# Patient Record
Sex: Male | Born: 1960 | Race: White | Hispanic: No | Smoking: Former smoker
Health system: Southern US, Community
[De-identification: ages and names within clinical notes are randomized; demographics above are authoritative.]

## PROBLEM LIST (undated history)

## (undated) DIAGNOSIS — G8929 Other chronic pain: Secondary | ICD-10-CM

## (undated) DIAGNOSIS — K219 Gastro-esophageal reflux disease without esophagitis: Secondary | ICD-10-CM

## (undated) DIAGNOSIS — F32A Depression, unspecified: Secondary | ICD-10-CM

## (undated) DIAGNOSIS — J449 Chronic obstructive pulmonary disease, unspecified: Secondary | ICD-10-CM

## (undated) DIAGNOSIS — E871 Hypo-osmolality and hyponatremia: Secondary | ICD-10-CM

## (undated) DIAGNOSIS — C8290 Follicular lymphoma, unspecified, unspecified site: Secondary | ICD-10-CM

## (undated) DIAGNOSIS — M1990 Unspecified osteoarthritis, unspecified site: Secondary | ICD-10-CM

## (undated) DIAGNOSIS — Z8614 Personal history of Methicillin resistant Staphylococcus aureus infection: Secondary | ICD-10-CM

## (undated) DIAGNOSIS — B338 Other specified viral diseases: Secondary | ICD-10-CM

## (undated) DIAGNOSIS — U071 COVID-19: Secondary | ICD-10-CM

## (undated) DIAGNOSIS — E236 Other disorders of pituitary gland: Secondary | ICD-10-CM

## (undated) DIAGNOSIS — J3089 Other allergic rhinitis: Secondary | ICD-10-CM

## (undated) DIAGNOSIS — I4711 Inappropriate sinus tachycardia, so stated: Secondary | ICD-10-CM

## (undated) DIAGNOSIS — D332 Benign neoplasm of brain, unspecified: Secondary | ICD-10-CM

## (undated) DIAGNOSIS — R0600 Dyspnea, unspecified: Secondary | ICD-10-CM

## (undated) DIAGNOSIS — R748 Abnormal levels of other serum enzymes: Secondary | ICD-10-CM

## (undated) DIAGNOSIS — J189 Pneumonia, unspecified organism: Secondary | ICD-10-CM

## (undated) DIAGNOSIS — J45909 Unspecified asthma, uncomplicated: Secondary | ICD-10-CM

## (undated) DIAGNOSIS — I10 Essential (primary) hypertension: Secondary | ICD-10-CM

## (undated) DIAGNOSIS — F329 Major depressive disorder, single episode, unspecified: Secondary | ICD-10-CM

## (undated) DIAGNOSIS — G4730 Sleep apnea, unspecified: Secondary | ICD-10-CM

## (undated) DIAGNOSIS — C8590 Non-Hodgkin lymphoma, unspecified, unspecified site: Secondary | ICD-10-CM

## (undated) HISTORY — DX: Depression, unspecified: F32.A

## (undated) HISTORY — PX: BACK SURGERY: SHX140

## (undated) HISTORY — DX: Major depressive disorder, single episode, unspecified: F32.9

## (undated) HISTORY — DX: Follicular lymphoma, unspecified, unspecified site: C82.90

## (undated) MED FILL — Famotidine Inj 200 MG/20ML: INTRAVENOUS | Qty: 4 | Status: AC

---

## 2003-04-26 ENCOUNTER — Ambulatory Visit (HOSPITAL_COMMUNITY): Admission: RE | Admit: 2003-04-26 | Discharge: 2003-04-26 | Payer: Self-pay | Admitting: Chiropractic Medicine

## 2003-04-26 ENCOUNTER — Encounter: Payer: Self-pay | Admitting: Chiropractic Medicine

## 2005-09-10 ENCOUNTER — Ambulatory Visit: Payer: Self-pay | Admitting: Chiropractic Medicine

## 2005-11-03 ENCOUNTER — Ambulatory Visit (HOSPITAL_COMMUNITY): Admission: RE | Admit: 2005-11-03 | Discharge: 2005-11-03 | Payer: Self-pay | Admitting: Neurosurgery

## 2005-12-02 ENCOUNTER — Encounter: Admission: RE | Admit: 2005-12-02 | Discharge: 2005-12-02 | Payer: Self-pay | Admitting: Neurosurgery

## 2006-03-26 ENCOUNTER — Ambulatory Visit: Payer: Self-pay

## 2006-04-13 ENCOUNTER — Ambulatory Visit (HOSPITAL_COMMUNITY): Admission: RE | Admit: 2006-04-13 | Discharge: 2006-04-14 | Payer: Self-pay | Admitting: Neurosurgery

## 2006-04-29 ENCOUNTER — Inpatient Hospital Stay (HOSPITAL_COMMUNITY): Admission: EM | Admit: 2006-04-29 | Discharge: 2006-05-07 | Payer: Self-pay | Admitting: Emergency Medicine

## 2006-04-30 ENCOUNTER — Ambulatory Visit: Payer: Self-pay | Admitting: Infectious Diseases

## 2006-05-14 ENCOUNTER — Ambulatory Visit: Payer: Self-pay | Admitting: Neurosurgery

## 2006-05-25 ENCOUNTER — Ambulatory Visit: Payer: Self-pay | Admitting: Neurosurgery

## 2006-06-01 ENCOUNTER — Ambulatory Visit: Payer: Self-pay | Admitting: Neurosurgery

## 2006-06-09 ENCOUNTER — Ambulatory Visit: Payer: Self-pay | Admitting: Internal Medicine

## 2006-06-09 ENCOUNTER — Ambulatory Visit: Payer: Self-pay | Admitting: Neurosurgery

## 2006-06-09 DIAGNOSIS — M8618 Other acute osteomyelitis, other site: Secondary | ICD-10-CM | POA: Insufficient documentation

## 2006-07-12 ENCOUNTER — Ambulatory Visit: Payer: Self-pay | Admitting: Internal Medicine

## 2006-08-11 ENCOUNTER — Ambulatory Visit: Payer: Self-pay | Admitting: Internal Medicine

## 2006-08-25 ENCOUNTER — Ambulatory Visit: Payer: Self-pay | Admitting: Internal Medicine

## 2006-09-23 ENCOUNTER — Ambulatory Visit: Payer: Self-pay | Admitting: Internal Medicine

## 2006-10-12 ENCOUNTER — Ambulatory Visit: Payer: Self-pay | Admitting: Internal Medicine

## 2007-12-01 HISTORY — PX: NECK SURGERY: SHX720

## 2007-12-02 ENCOUNTER — Emergency Department: Payer: Self-pay | Admitting: Emergency Medicine

## 2009-05-07 ENCOUNTER — Ambulatory Visit: Payer: Self-pay | Admitting: Family Medicine

## 2010-07-26 ENCOUNTER — Emergency Department: Payer: Self-pay | Admitting: Emergency Medicine

## 2010-09-11 ENCOUNTER — Ambulatory Visit: Payer: Self-pay | Admitting: Urology

## 2012-06-16 ENCOUNTER — Ambulatory Visit: Payer: Self-pay | Admitting: Unknown Physician Specialty

## 2012-06-30 ENCOUNTER — Ambulatory Visit: Payer: Self-pay | Admitting: Unknown Physician Specialty

## 2012-09-05 DIAGNOSIS — E236 Other disorders of pituitary gland: Secondary | ICD-10-CM | POA: Insufficient documentation

## 2012-10-05 ENCOUNTER — Ambulatory Visit: Payer: Self-pay | Admitting: Family Medicine

## 2012-10-07 LAB — HM HEPATITIS C SCREENING LAB: HM HEPATITIS C SCREENING: NEGATIVE

## 2012-10-15 ENCOUNTER — Ambulatory Visit: Payer: Self-pay | Admitting: Internal Medicine

## 2012-10-24 DIAGNOSIS — N401 Enlarged prostate with lower urinary tract symptoms: Secondary | ICD-10-CM | POA: Insufficient documentation

## 2012-11-02 DIAGNOSIS — K5732 Diverticulitis of large intestine without perforation or abscess without bleeding: Secondary | ICD-10-CM | POA: Insufficient documentation

## 2012-11-18 ENCOUNTER — Ambulatory Visit: Payer: Self-pay | Admitting: Internal Medicine

## 2013-01-04 ENCOUNTER — Ambulatory Visit: Payer: Self-pay | Admitting: General Surgery

## 2013-02-08 ENCOUNTER — Ambulatory Visit: Payer: Self-pay | Admitting: Family Medicine

## 2013-04-28 ENCOUNTER — Ambulatory Visit: Payer: Self-pay | Admitting: Neurology

## 2013-07-19 ENCOUNTER — Observation Stay: Payer: Self-pay | Admitting: Family Medicine

## 2013-07-19 LAB — COMPREHENSIVE METABOLIC PANEL
Albumin: 4.2 g/dL (ref 3.4–5.0)
Bilirubin,Total: 0.3 mg/dL (ref 0.2–1.0)
Co2: 24 mmol/L (ref 21–32)
Glucose: 98 mg/dL (ref 65–99)
Osmolality: 277 (ref 275–301)
Potassium: 3.7 mmol/L (ref 3.5–5.1)
SGOT(AST): 40 U/L — ABNORMAL HIGH (ref 15–37)
SGPT (ALT): 39 U/L (ref 12–78)

## 2013-07-19 LAB — TROPONIN I
Troponin-I: <0.02 ng/mL
Troponin-I: <0.02 ng/mL

## 2013-07-19 LAB — CBC WITH DIFFERENTIAL/PLATELET
Basophil #: 0 10*3/uL (ref 0.0–0.1)
Eosinophil #: 0 10*3/uL (ref 0.0–0.7)
Eosinophil %: 0.5 %
HGB: 16.4 g/dL (ref 13.0–18.0)
Lymphocyte %: 34 %
MCV: 96 fL (ref 80–100)
Monocyte #: 0.7 x10 3/mm (ref 0.2–1.0)
Monocyte %: 10.8 %
Neutrophil #: 3.8 10*3/uL (ref 1.4–6.5)
Neutrophil %: 54.3 %
RDW: 12.7 % (ref 11.5–14.5)

## 2013-07-19 LAB — URINALYSIS, COMPLETE
Bacteria: NONE SEEN
Bilirubin,UR: NEGATIVE
Blood: NEGATIVE
Glucose,UR: NEGATIVE mg/dL (ref 0–75)
Leukocyte Esterase: NEGATIVE
Nitrite: NEGATIVE
Ph: 6 (ref 4.5–8.0)
Protein: NEGATIVE
Squamous Epithelial: NONE SEEN

## 2013-07-19 LAB — DRUG SCREEN, URINE
Benzodiazepine, Ur Scrn: NEGATIVE (ref ?–200)
Cocaine Metabolite,Ur ~~LOC~~: NEGATIVE (ref ?–300)
MDMA (Ecstasy)Ur Screen: NEGATIVE (ref ?–500)

## 2013-07-19 LAB — ETHANOL: Ethanol %: 0.139 % — ABNORMAL HIGH (ref 0.000–0.080)

## 2013-07-19 LAB — CK TOTAL AND CKMB (NOT AT ARMC)
CK, Total: 265 U/L — ABNORMAL HIGH (ref 35–232)
CK, Total: 236 U/L — ABNORMAL HIGH (ref 35–232)
CK-MB: 2.8 ng/mL (ref 0.5–3.6)
CK-MB: 2.4 ng/mL (ref 0.5–3.6)

## 2013-07-20 DIAGNOSIS — R079 Chest pain, unspecified: Secondary | ICD-10-CM

## 2013-11-02 ENCOUNTER — Ambulatory Visit: Payer: Self-pay | Admitting: Family Medicine

## 2013-11-06 ENCOUNTER — Ambulatory Visit: Payer: Self-pay | Admitting: Family Medicine

## 2014-01-06 ENCOUNTER — Ambulatory Visit: Payer: Self-pay | Admitting: Family Medicine

## 2014-01-15 ENCOUNTER — Encounter: Payer: Self-pay | Admitting: General Surgery

## 2014-01-22 ENCOUNTER — Ambulatory Visit: Payer: Self-pay | Admitting: Family Medicine

## 2014-09-21 DIAGNOSIS — K4090 Unilateral inguinal hernia, without obstruction or gangrene, not specified as recurrent: Secondary | ICD-10-CM | POA: Insufficient documentation

## 2015-03-22 NOTE — H&P (Signed)
PATIENT NAME:  Russell Simpson, NAVE MR#:  [id number] DATE OF BIRTH:  1961/05/08  DATE OF ADMISSION:  07/19/2013  PRIMARY CARE PHYSICIAN:  Miguel Aschoff, MD  PRIMARY PSYCHIATRIST:  Dr. Nicolasa Ducking.    CHIEF COMPLAINT: Chest pain.   HISTORY OF PRESENT ILLNESS: A 54 year old male patient with history of hypertension, obstructive sleep apnea, presented to the Emergency Room after waking up with acute midsternal chest pain from his sleep. The patient also had some trouble breathing. Did not have any nausea, diaphoresis. No palpitations, syncope. The patient was brought in by his dad for evaluation Emergency Room here. EKG has been found to be normal. Cardiac enzymes are normal. Admitted to rule out acute coronary syndrome due to his risk factors. This has been nonradiating. The patient never had any stress test, cardiac catheter in the past. He does not have any recent hospital stay, surgeries, long flight, car journeys. No history of deep vein thrombosis, and PE, no hypercoagulable disorders. No tachycardia or hypoxia in the Emergency Room.   His alcohol level is 0.139, although the patient has mentioned that his last drink was on Saturday.   I discussed his case with Dr. Nicolasa Ducking, his primary psychiatrist, who has mentioned that the patient normally downplays his alcohol consumption, but tends to drink heavily.   PAST MEDICAL HISTORY: Hypertension, bipolar, anxiety, obstructive sleep apnea, and gout.   FAMILY HISTORY: No health problems in his mom and dad as per the patient.   ALLERGIES: CODEINE AND TETANUS.   HOME MEDICATIONS: Include: 1.  Amlodipine 10 mg daily.  2.  Bromocriptine 2.5 mg oral daily.  3.  Trazodone 150 mg daily.   REVIEW OF SYSTEMS:  CONSTITUTIONAL: No fever, fatigue, weakness.  EYES: No blurred vision, pain or redness.  ENT: No tinnitus, ear pain, hearing loss.  RESPIRATORY: No shortness of breath, cough.  CARDIOVASCULAR: Has chest pain. No edema or palpitations.   GASTROINTESTINAL: No nausea, vomiting, diarrhea.  GENITOURINARY: No urgency or frequency.  HEMATOLOGY: No anemia, easy bruising, bleeding.  INTEGUMENTARY: No acne, rash, lesions.  MUSCULOSKELETAL: No joint swelling or redness. Has history of gout.  NEUROLOGIC: No focal numbness, weakness, seizures.  PSYCHIATRIC: Has anxiety, bipolar.   SOCIAL HISTORY: The patient drinks alcohol heavily, although he mentions his last drink of alcohol was on Saturday. Presently, his alcohol level to 0.139. He does not quantify how much he drinks. Does not smoke. No illicit drugs.   PHYSICAL EXAMINATION: VITAL SIGNS: Temperature 98.7, pulse 91, blood pressure 157/99, saturating 98% on room air.  GENERAL: Obese Caucasian male patient lying in bed, comfortable, conversational, cooperative with exam.  PSYCHIATRIC: Alert and oriented x 3. Mood and affect appropriate. Judgment intact.  HEENT: Atraumatic, normocephalic. Oral mucosa moist and pink. External ears and nose normal. No pallor. No icterus. Pupils bilaterally equal and reactive to light.  NECK: Supple. No thyromegaly. No palpable lymph nodes. Trachea midline. No carotid bruit, JVD.  CARDIOVASCULAR: S1, S2, without any murmurs. Peripheral pulses 2+.  GASTROINTESTINAL: Soft abdomen, nontender. Bowel sounds present. No evidence of hepatosplenomegaly palpable.  SKIN: Warm and dry. No petechiae, rash, ulcers.  MUSCULOSKELETAL: No joint swelling, redness, effusion of the large joints. Normal muscle tone.  NEUROLOGICAL: Motor strength 5/5 in upper and lower extremities.  SKIN: No petechiae, rash, ulcers.    LABORATORY AND RADIOLOGICAL DATA: Lab shows troponin normal x 2. Alcohol 0.139 CBC and BMP in the normal range. Urine drug screen positive for opiates.   EKG showed normal sinus rhythm.   ASSESSMENT AND  PLAN: 1.  Chest pain in a patient with hypertension, obstructive sleep apnea. I will get a stress test once cardiac enzymes are normal. This can only be  done in the morning. The patient will be on telemetry floor, overnight.  2.  Alcohol abuse. The patient will CIWA scale if he stays one more night. We will watch out for any withdrawals during the stay.  3.  Anxiety. Continue home medications.  4.  Hypertension. Continue Norvasc.  5.  Deep vein thrombosis prophylaxis, Lovenox.   CODE STATUS: Full code.   TIME SPENT TODAY ON THIS CASE: 40 minutes.     ____________________________ Leia Alf Hasana Alcorta, MD srs:cc D: 07/19/2013 16:09:55 ET T: 07/19/2013 16:53:28 ET JOB#: [id number]  cc: Alveta Heimlich R. Khai Torbert, MD, <Dictator> Richard L. Rosanna Randy, MD Neita Carp MD ELECTRONICALLY SIGNED 07/21/2013 12:35

## 2015-03-22 NOTE — Discharge Summary (Signed)
PATIENT NAME:  Russell Simpson, Russell Simpson MR#:  [id number] DATE OF BIRTH:  30-Jan-1961  DATE OF ADMISSION:  07/19/2013 DATE OF DISCHARGE:  07/20/2013  REASON FOR ADMISSION: Chest pain.   PRIMARY CARE PHYSICIAN: Dr. Miguel Aschoff.  PSYCHIATRIST: Dr. Nicolasa Ducking.   DISCHARGE DIAGNOSES: 1. Atypical chest pain.  2. Alcohol abuse.  3. Hypertension.  4. Bipolar disorder.  5. Anxiety.  6. Sleep apnea.  7. Gout.   IMPORTANT TESTS:  1. Cardiac stress test: Normal diffuse upsloping ST depressions. BP reduced to 119/90 at start on stage I, increased thereafter. Myoview imaging to follow.  2. Exercise myocardial perfusion study: No significant ischemia. No significant wall abnormalities. Estimation of ejection fraction 56%. Left ventricular global function was normal. No EKG changes concerning of ischemia. There was no artifact in this study.  3. Cardiac enzymes were negative x 3.  4. His alcohol level on admission was 0.13.  5. Creatinine was 0.9, sodium 139, glucose 98. UDS positive for opiates. White count 6.9, hemoglobin 16.4. Urinalysis did not show any signs of infection.  6. EKG: Normal sinus rhythm without any significant ST depression or elevation. Positive left atrial enlargement.   DISCHARGE DISPOSITION: Home.   MEDICATIONS AT DISCHARGE:  1. Bromocriptine 2.5 mg twice daily. 2. Duloxetine 30 mg twice daily.  3. Finasteride 5 mg once daily.  4. Amlodipine 10 mg once daily. 5. Trazodone 150 mg at bedtime.  6. Protonix 40 mg once a day.   HOSPITAL COURSE: This is a very nice 54 year old gentleman who has history of psychiatric problems, follow-up by Dr. Nicolasa Ducking, primary psychiatrist, and Dr. Miguel Aschoff. The patient came to the ER on 07/19/2013 after waking up with acute midsternal chest pain from his sleep. The patient also had some trouble breathing. The patient did not have any nausea, diaphoresis or palpitations. His EKG was normal on admission. Cardiac enzymes were normal at the beginning.    The patient was admitted to rule out acute coronary syndrome. He did not have any significant risk factors for PE. He was not tachycardic or hypoxic. The patient has history of heavy drinking and apparently, he downplays his amount of alcohol that he drinks and this is coming from his psychiatrist, Dr. Nicolasa Ducking, and also from his family. The patient also has sleep apnea and he is not very compliant with his treatment, especially when he is drinking. The patient also has been going through a lot of stress at work with changes on the systems that they are using, now going to computer system. 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 This brings a lot of stress because he is afraid of losing his job. The patient was admitted, put on CIWA protocol. A stress test was done the following day, did not show any abnormalities. I had a long discussion with the patient about quitting drinking. The patient states that he is going to try. Likely the pain was related to anxiety attack. The patient has been counseled about this, about looking for hobbies and looking for other escape of anxiety. The patient has been recommended to go follow closely with Dr. Nicolasa Ducking and he accepts this. Other than that, he did good during this hospitalization, no other major issues.   I spent about 45 minutes with this patient in discharge. We had a really long conversation about his health, especially with the alcohol consumption.   ____________________________ [place] Sink, MD rsg:aw D: 07/23/2013 06:47:00 ET T: 07/23/2013 08:50:55 ET JOB#: [id number]  cc: Aarti K.  Nicolasa Ducking, MD Richard L. Rosanna Randy, MD [place] Sink, MD, <Dictator>   Branon Sabine America Brown MD ELECTRONICALLY SIGNED 07/28/2013 12:43

## 2015-05-30 DIAGNOSIS — R339 Retention of urine, unspecified: Secondary | ICD-10-CM | POA: Insufficient documentation

## 2015-08-27 ENCOUNTER — Other Ambulatory Visit: Payer: Self-pay | Admitting: Family Medicine

## 2015-08-27 NOTE — Telephone Encounter (Signed)
Pt stated that he needs Cymbalta sent to [facility]. Pt stated that the person that was prescribing this medication pt no longer is seeing and would like you to take over the medication. Pt stated he has discussed this with you at her last visit. Thanks TNP

## 2015-08-27 NOTE — Telephone Encounter (Signed)
Please have pharmacy send me another fax or electronic request.

## 2015-08-27 NOTE — Telephone Encounter (Signed)
Please advise. KW 

## 2015-08-28 ENCOUNTER — Other Ambulatory Visit: Payer: Self-pay | Admitting: Family Medicine

## 2015-08-28 DIAGNOSIS — F32A Depression, unspecified: Secondary | ICD-10-CM

## 2015-08-28 DIAGNOSIS — F329 Major depressive disorder, single episode, unspecified: Secondary | ICD-10-CM

## 2015-08-28 MED ORDER — DULOXETINE HCL 30 MG PO CPEP: ORAL_CAPSULE | ORAL | Status: DC

## 2015-08-28 NOTE — Telephone Encounter (Signed)
Advised patient to contact pharmacy to have prescription faxed to our office. KW

## 2015-09-03 ENCOUNTER — Ambulatory Visit (INDEPENDENT_AMBULATORY_CARE_PROVIDER_SITE_OTHER): Payer: BLUE CROSS/BLUE SHIELD | Admitting: Podiatry

## 2015-09-03 ENCOUNTER — Encounter: Payer: Self-pay | Admitting: Podiatry

## 2015-09-03 VITALS — BP 141/90 | HR 104 | Resp 18

## 2015-09-03 DIAGNOSIS — L600 Ingrowing nail: Secondary | ICD-10-CM | POA: Diagnosis not present

## 2015-09-03 NOTE — Patient Instructions (Signed)

## 2015-09-03 NOTE — Progress Notes (Signed)
   Subjective:    Patient ID: Russell Simpson, male    DOB: December 04, 1960, 54 y.o.   MRN: [id number]  HPI   54 year old male presents the office of concerns of an ingrown toenail along the medial aspect of the right hallux toenail. He states the pain has been ongoing for the last several months and he tries to trim the area out himself. He states that since trimming the area the pain has decreased although as the nail grows it continues to be ingrown causing discomfort. He denies any redness or swelling or any drainage/past coming from the nail border. No other complaints at this time.   Review of Systems  All other systems reviewed and are negative.      Objective:   Physical Exam AAO x3, NAD DP/PT pulses palpable bilaterally, CRT less than 3 seconds Protective sensation intact with Simms Weinstein monofilament, vibratory sensation intact, Achilles tendon reflex intact  there is incurvation on the medial aspect of the right hallux toenail  With mild tenderness to palpation on the nail border. There is no edema erythema or increase in warmth to the area. There is no drainage or purulence. There is a callus overlying the nail border. There is no tenderness along the lateral nail border. There is no pain to the remaining toenails. No  otherareas of tenderness to bilateral lower extremities. MMT 5/5, ROM WNL.  No open lesions or pre-ulcerative lesions.  No overlying edema, erythema, increase in warmth to bilateral lower extremities.  No pain with calf compression, swelling, warmth, erythema bilaterally.      Assessment & Plan:   54 year old male with right medial hallux ingrown toenail -Treatment options discussed including all alternatives, risks, and complications -At this time, the patient is requesting partial nail removal with chemical matricectomy to the symptomatic portion of the nail. Risks and complications were discussed with the patient for which they understand and  verbally consent  to the procedure. Under sterile conditions a total of 3 mL of a mixture of 2% lidocaine plain and 0.5% Marcaine plain was infiltrated in a hallux block fashion. Once anesthetized, the skin was prepped in sterile fashion. A tourniquet was then applied. Next the medial aspect of hallux nail border was then sharply excised making sure to remove the entire offending nail border. Once the nails were ensured to be removed area was debrided and the underlying skin was intact. There is no purulence identified in the procedure. Next phenol was then applied under standard conditions and copiously irrigated. Silvadene was applied. A dry sterile dressing was applied. After application of the dressing the tourniquet was removed and there is found to be an immediate capillary refill time to the digit. The patient tolerated the procedure well any complications. Post procedure instructions were discussed the patient for which he verbally understood. Follow-up in one week for nail check or sooner if any problems are to arise. Discussed signs/symptoms of infection and directed to call the office immediately should any occur or go directly to the emergency room. In the meantime, encouraged to call the office with any questions, concerns, changes symptoms.  Celesta Gentile, DPM

## 2015-09-10 ENCOUNTER — Encounter: Payer: Self-pay | Admitting: Podiatry

## 2015-09-10 ENCOUNTER — Ambulatory Visit (INDEPENDENT_AMBULATORY_CARE_PROVIDER_SITE_OTHER): Payer: BLUE CROSS/BLUE SHIELD | Admitting: Podiatry

## 2015-09-10 DIAGNOSIS — Z9889 Other specified postprocedural states: Secondary | ICD-10-CM

## 2015-09-10 NOTE — Patient Instructions (Signed)

## 2015-09-10 NOTE — Progress Notes (Signed)
Patient ID: Russell Simpson, male   DOB: Nov 05, 1961, 54 y.o.   MRN: [id number]  Subjective: 54 year old male presents the office today for 1 week for evaluation status post right medial hallux partial nail avulsion. He states that he is doing well he's had no problems or any pain to the area. She denies any drainage or purulence. Denies any surrounding redness or red streaks. He's been soaking his foot in Epson salt soaks twice a day covering with antibiotic ointment and a Band-Aid. No other complaints at this time in no acute changes. He denies any systemic complaints such as fevers, chills, nausea, vomiting. No calf pain, chest pain, SOB.   Objective: AAO 3, NAD Progress recess intact and unchanged Status post right medial hallux partial nail avulsion. There is small amount of granulation tissue present within the procedure site. There is no surrounding erythema, ascending cellulitis, fluctuance, crepitus, malodor, drainage/purulence. There is no tenderness to palpation along the surgical site. Patient area is healing well any competitions or signs of infection. No other open lesions or pre-ulcerative lesions. There is no pain with calf compression, swelling, warmth, erythema. There is no pain to bilateral lower extremities.  Assessment: 54 year old male 1 week status post right medial hallux partial nail avulsion, healing well without signs of infection.  Plan: Continue soaking in epsom salts twice a day followed by antibiotic ointment and a band-aid. Can leave uncovered at night. Continue this until completely healed.  If the area has not healed in 2 weeks, call the office for follow-up appointment, or sooner if any problems arise.  Monitor for any signs/symptoms of infection. Call the office immediately if any occur or go directly to the emergency room. Call with any questions/concerns.  Celesta Gentile, DPM

## 2015-09-19 ENCOUNTER — Other Ambulatory Visit: Payer: Self-pay | Admitting: Family Medicine

## 2015-09-19 ENCOUNTER — Telehealth: Payer: Self-pay | Admitting: Family Medicine

## 2015-09-19 DIAGNOSIS — G4700 Insomnia, unspecified: Secondary | ICD-10-CM

## 2015-09-19 DIAGNOSIS — D352 Benign neoplasm of pituitary gland: Secondary | ICD-10-CM

## 2015-09-19 MED ORDER — TRAZODONE HCL 100 MG PO TABS: ORAL_TABLET | ORAL | Status: DC

## 2015-09-19 NOTE — Telephone Encounter (Signed)
Lab slip available for pickup

## 2015-09-19 NOTE — Telephone Encounter (Signed)
Pt is requesting a lab slip to check his prolactin levels due to increased headaches.  Please contact him at [phone] when ready for pick up.

## 2015-09-19 NOTE — Telephone Encounter (Signed)
Please review. KW 

## 2015-09-20 ENCOUNTER — Other Ambulatory Visit: Payer: Self-pay | Admitting: Family Medicine

## 2015-09-20 NOTE — Telephone Encounter (Signed)
Patient has been advised lab available for pick up. KW

## 2015-09-21 LAB — PROLACTIN: Prolactin: 8.6 ng/mL (ref 4.0–15.2)

## 2015-09-24 ENCOUNTER — Telehealth: Payer: Self-pay | Admitting: Family Medicine

## 2015-09-24 NOTE — Telephone Encounter (Signed)
Pt stated that he had already received the results from his Urologist.   Thanks,

## 2015-09-24 NOTE — Telephone Encounter (Signed)
Pt is requesting results from lab work.  CB#[phone]/MW

## 2015-09-30 ENCOUNTER — Telehealth: Payer: Self-pay

## 2015-09-30 NOTE — Telephone Encounter (Signed)
-----   Message from Carmon Ginsberg, [place] sent at 09/28/2015 10:05 AM EDT ----- Prolactin is normal

## 2015-09-30 NOTE — Telephone Encounter (Signed)
LMTCB  Thanks,  -Russell Simpson 

## 2015-10-02 NOTE — Telephone Encounter (Signed)
Patient has been advised. KW 

## 2015-10-15 ENCOUNTER — Ambulatory Visit (INDEPENDENT_AMBULATORY_CARE_PROVIDER_SITE_OTHER): Payer: BLUE CROSS/BLUE SHIELD | Admitting: Family Medicine

## 2015-10-15 ENCOUNTER — Encounter: Payer: Self-pay | Admitting: Family Medicine

## 2015-10-15 VITALS — BP 132/90 | HR 100 | Temp 98.3°F | Resp 16 | Wt 218.4 lb

## 2015-10-15 DIAGNOSIS — J309 Allergic rhinitis, unspecified: Secondary | ICD-10-CM | POA: Diagnosis not present

## 2015-10-15 DIAGNOSIS — N529 Male erectile dysfunction, unspecified: Secondary | ICD-10-CM | POA: Insufficient documentation

## 2015-10-15 DIAGNOSIS — R05 Cough: Secondary | ICD-10-CM

## 2015-10-15 DIAGNOSIS — R059 Cough, unspecified: Secondary | ICD-10-CM

## 2015-10-15 DIAGNOSIS — F329 Major depressive disorder, single episode, unspecified: Secondary | ICD-10-CM | POA: Insufficient documentation

## 2015-10-15 DIAGNOSIS — D352 Benign neoplasm of pituitary gland: Secondary | ICD-10-CM | POA: Insufficient documentation

## 2015-10-15 DIAGNOSIS — E785 Hyperlipidemia, unspecified: Secondary | ICD-10-CM | POA: Insufficient documentation

## 2015-10-15 DIAGNOSIS — F32A Depression, unspecified: Secondary | ICD-10-CM | POA: Insufficient documentation

## 2015-10-15 DIAGNOSIS — N400 Enlarged prostate without lower urinary tract symptoms: Secondary | ICD-10-CM | POA: Insufficient documentation

## 2015-10-15 DIAGNOSIS — E291 Testicular hypofunction: Secondary | ICD-10-CM | POA: Insufficient documentation

## 2015-10-15 NOTE — Progress Notes (Signed)
Subjective:     Patient ID: Russell Simpson, male   DOB: 01/10/1961, 54 y.o.   MRN: [id number]  HPI  Chief Complaint  Patient presents with  . Cough    Patient comes in office today with concerns of dry cough for the past year. Patient states that cough is worse for him when laying down at night, he states he was seen by Urgent Care in spring for same issue chest x-ray was clear.   States he tends to eat before he goes to bed. His visit was precipitated by his fiancee who hears him cough at night.   Review of Systems  Respiratory: Negative for shortness of breath and wheezing.        Objective:   Physical Exam  Constitutional: He appears well-developed and well-nourished. No distress.  Pulmonary/Chest: Breath sounds normal.  Abdominal: Soft. There is no tenderness.       Assessment:    1. Cough: ? Mediated by nocturnal reflux     Plan:    Discussed use of Dexilant 60 mg (#15 samples) and reflux precautions.

## 2015-10-15 NOTE — Patient Instructions (Signed)
Try Dexilant with your evening meal. Don't lay down for 2 hours after you eat. Consider putting legs of the head of your bed up on 6 inch blocks.

## 2015-11-05 ENCOUNTER — Other Ambulatory Visit: Payer: Self-pay | Admitting: Family Medicine

## 2015-11-05 ENCOUNTER — Telehealth: Payer: Self-pay | Admitting: Family Medicine

## 2015-11-05 DIAGNOSIS — R059 Cough, unspecified: Secondary | ICD-10-CM

## 2015-11-05 DIAGNOSIS — R05 Cough: Secondary | ICD-10-CM

## 2015-11-05 MED ORDER — BENZONATATE 100 MG PO CAPS: ORAL_CAPSULE | ORAL | Status: DC

## 2015-11-05 NOTE — Telephone Encounter (Signed)
Please review, Dr. Rosanna Randy did not see this patient. Thank you-aa

## 2015-11-05 NOTE — Telephone Encounter (Signed)
If you have done the following: don't lie down for 2 hours after eating, raised the legs at the head of your bed 6 inches, and taken the Dexilant in the evening----Continue all of the these measures but change the medication as follows over the counter: take two Prevacid 15 mg in the AM; and Two Zantac 75 mg. At bedtime. Try this for 2 weeks then call me.

## 2015-11-05 NOTE — Telephone Encounter (Signed)
Spoke with patient on the phone and advised he states this issue is not with reflux he has already taken measures he said to reduce it, patient reports his issue is with coughing and would like for prescription to be called in because he is having a hard time getting through the night. KW

## 2015-11-05 NOTE — Telephone Encounter (Signed)
I will call in a medication to suppress the cough reflex. If this does not help I want you to come in and see Dr. Caryn Section.

## 2015-11-05 NOTE — Telephone Encounter (Signed)
Pt called saying he is stillhaving the cough at night. Nothing that you have suggested to him has helped,  Please advise [phone]  Thanks teri

## 2015-11-05 NOTE — Telephone Encounter (Signed)
Patient has been advised. KW 

## 2016-01-10 ENCOUNTER — Encounter: Payer: Self-pay | Admitting: Family Medicine

## 2016-01-10 ENCOUNTER — Ambulatory Visit (INDEPENDENT_AMBULATORY_CARE_PROVIDER_SITE_OTHER): Payer: BLUE CROSS/BLUE SHIELD | Admitting: Family Medicine

## 2016-01-10 VITALS — BP 140/98 | HR 98 | Temp 97.5°F | Resp 18 | Wt 219.0 lb

## 2016-01-10 DIAGNOSIS — R05 Cough: Secondary | ICD-10-CM

## 2016-01-10 DIAGNOSIS — D352 Benign neoplasm of pituitary gland: Secondary | ICD-10-CM | POA: Insufficient documentation

## 2016-01-10 DIAGNOSIS — I10 Essential (primary) hypertension: Secondary | ICD-10-CM | POA: Insufficient documentation

## 2016-01-10 DIAGNOSIS — J3089 Other allergic rhinitis: Secondary | ICD-10-CM

## 2016-01-10 DIAGNOSIS — R053 Chronic cough: Secondary | ICD-10-CM

## 2016-01-10 DIAGNOSIS — F419 Anxiety disorder, unspecified: Secondary | ICD-10-CM | POA: Insufficient documentation

## 2016-01-10 DIAGNOSIS — G4730 Sleep apnea, unspecified: Secondary | ICD-10-CM | POA: Insufficient documentation

## 2016-01-10 MED ORDER — MONTELUKAST SODIUM 10 MG PO TABS: 10.0000 mg | ORAL_TABLET | Freq: Every day | ORAL | Status: DC

## 2016-01-10 NOTE — Patient Instructions (Signed)
Call for referral to specialist if not much better within 1 week

## 2016-01-10 NOTE — Progress Notes (Signed)
Patient ID: Russell Simpson, male   DOB: 1961-02-01, 55 y.o.   MRN: [id number]       Patient: Russell Simpson Male    DOB: 04/25/1961   55 y.o.   MRN: [id number] Visit Date: 01/10/2016  Today's Provider: Lelon Huh, MD   Chief Complaint  Patient presents with  . Cough   Subjective:    HPI Pt is here today for chronic cough. He was treated for pneumonia at the end of 2014 which cleared on CT scan after treatment with Levaquin. he was diagnosed with viral bronchitis in August of 2015. He states that cough had cleared, but current cough started about a year or so ago. He was apparently seen at [facility]. in the spring of 2016 for cough and had normal chest Xr. He was seen here in November 2016 and thought  cough was thought to besecondary to reflux and was given Dexilant samples with no improvement. He has changed diet and elevated head of bed with no improvement. Cough is minimal during day, but wakes him up at night and is very persistent. It feels like a tickle in his throat that won't go away. Pt denies any SOB. No fevers or night sweats. No weight loss. Cough is usually not productive, but occasional some clear phlegm comes up. 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  He does have long history of allergies for which he takes claritin and Flonase, and feels allergy symptoms are well controlled.     Allergies  Allergen Reactions  . Penicillins Anaphylaxis  . Tetanus Toxoids Swelling  . Lisinopril     Other reaction(s): Cough  . Losartan     Other reaction(s): Muscle Pain  . Tetanus Toxoid   . Codeine Nausea Only   Previous Medications   BENZONATATE (TESSALON) 100 MG CAPSULE    One or two at bedtime   BROMOCRIPTINE (PARLODEL) 2.5 MG TABLET    Take by mouth.   CYCLOBENZAPRINE (FLEXERIL) 5 MG TABLET       DULOXETINE  (CYMBALTA) 30 MG CAPSULE    Take 3 capsules once daily   FLUTICASONE (FLONASE) 50 MCG/ACT NASAL SPRAY    Place into the nose.   LORATADINE (CLARITIN) 10 MG TABLET    Take by mouth.   SILDENAFIL (REVATIO) 20 MG TABLET    3-5 tablets daily as needed   TAMSULOSIN (FLOMAX) 0.4 MG CAPS CAPSULE    Take 0.4 mg by mouth.   TESTOSTERONE CYPIONATE (DEPOTESTOTERONE CYPIONATE) 100 MG/ML INJECTION    Inject into the muscle.   TRAZODONE (DESYREL) 100 MG TABLET    Two at bedtime    Review of Systems  Constitutional: Negative.   HENT: Negative.   Eyes: Negative.   Respiratory: Positive for cough.   Cardiovascular: Negative.   Gastrointestinal: Negative.   Endocrine: Negative.   Genitourinary: Negative.   Musculoskeletal: Positive for neck pain.  Skin: Negative.   Allergic/Immunologic: Negative.   Neurological: Negative.   Hematological: Negative.   Psychiatric/Behavioral: Negative.     Social History  Substance Use Topics  . Smoking status: Light Tobacco Smoker    Types: Cigars  . Smokeless tobacco: Never Used     Comment: " sometimes during the day and at night"  . Alcohol Use: No   Objective:   BP  140/98 mmHg  Pulse 98  Temp(Src) 97.5 F (36.4 C) (Oral)  Resp 18  Wt 219 lb (99.338 kg)  SpO2 97%  Physical Exam  General Appearance:    Alert, cooperative, no distress  HENT:   ENT exam normal, no neck nodes or sinus tenderness, neck without nodes, throat normal without erythema or exudate, sinuses nontender and nasal mucosa congested  Eyes:    PERRL, conjunctiva/corneas clear, EOM's intact       Lungs:     Clear to auscultation bilaterally, respirations unlabored  Heart:    Regular rate and rhythm  Neurologic:   Awake, alert, oriented x 3. No apparent focal neurological           defect.        Spirometry: Normal    Assessment & Plan:      1. Chronic cough (nocturnal) No improvement with aggressive treatment for reflux. Normal spirometry. Suspect post nasal drainage as he  states he feels a tickle in his throat. Try adding Singular to allergy medications. Encouraged to keep air humidified especially at night. Consider ENT referral as he has sensation of throat irritation triggering cough.   - Spirometry with Graph  2. Other allergic rhinitis Add Singulair to current regiment.  - montelukast (SINGULAIR) 10 MG tablet; Take 1 tablet (10 mg total) by mouth at bedtime.  Dispense: 30 tablet; Refill: 3       Lelon Huh, MD  [facility]

## 2016-04-02 ENCOUNTER — Ambulatory Visit: Payer: BLUE CROSS/BLUE SHIELD | Admitting: Family Medicine

## 2016-04-09 ENCOUNTER — Other Ambulatory Visit: Payer: Self-pay | Admitting: Family Medicine

## 2016-06-03 ENCOUNTER — Other Ambulatory Visit: Payer: Self-pay | Admitting: Orthopedic Surgery

## 2016-06-11 ENCOUNTER — Encounter (HOSPITAL_COMMUNITY)
Admission: RE | Admit: 2016-06-11 | Discharge: 2016-06-11 | Disposition: A | Discharge status: Home / Self Care | Payer: BLUE CROSS/BLUE SHIELD | Source: Ambulatory Visit | Attending: Orthopedic Surgery | Admitting: Orthopedic Surgery

## 2016-06-11 ENCOUNTER — Encounter (HOSPITAL_COMMUNITY): Payer: Self-pay

## 2016-06-11 DIAGNOSIS — Z01810 Encounter for preprocedural cardiovascular examination: Secondary | ICD-10-CM | POA: Insufficient documentation

## 2016-06-11 DIAGNOSIS — Z01812 Encounter for preprocedural laboratory examination: Secondary | ICD-10-CM | POA: Diagnosis present

## 2016-06-11 HISTORY — DX: Benign neoplasm of brain, unspecified: D33.2

## 2016-06-11 HISTORY — DX: Other disorders of pituitary gland: E23.6

## 2016-06-11 HISTORY — DX: Pneumonia, unspecified organism: J18.9

## 2016-06-11 HISTORY — DX: Sleep apnea, unspecified: G47.30

## 2016-06-11 HISTORY — DX: Other chronic pain: G89.29

## 2016-06-11 HISTORY — DX: Other allergic rhinitis: J30.89

## 2016-06-11 HISTORY — DX: Unspecified asthma, uncomplicated: J45.909

## 2016-06-11 HISTORY — DX: Unspecified osteoarthritis, unspecified site: M19.90

## 2016-06-11 LAB — URINALYSIS, ROUTINE W REFLEX MICROSCOPIC
Bilirubin Urine: NEGATIVE
GLUCOSE, UA: NEGATIVE mg/dL
Hgb urine dipstick: NEGATIVE
KETONES UR: 15 mg/dL — AB
LEUKOCYTES UA: NEGATIVE
Nitrite: NEGATIVE
PH: 5.5 (ref 5.0–8.0)
Protein, ur: NEGATIVE mg/dL
SPECIFIC GRAVITY, URINE: 1.018 (ref 1.005–1.030)

## 2016-06-11 LAB — CBC WITH DIFFERENTIAL/PLATELET
BASOS ABS: 0 10*3/uL (ref 0.0–0.1)
BASOS PCT: 0 %
EOS PCT: 1 %
Eosinophils Absolute: 0 10*3/uL (ref 0.0–0.7)
HEMATOCRIT: 47.5 % (ref 39.0–52.0)
Hemoglobin: 16.5 g/dL (ref 13.0–17.0)
Lymphocytes Relative: 27 %
Lymphs Abs: 1.6 10*3/uL (ref 0.7–4.0)
MCH: 32.8 pg (ref 26.0–34.0)
MCHC: 34.7 g/dL (ref 30.0–36.0)
MCV: 94.4 fL (ref 78.0–100.0)
MONO ABS: 0.6 10*3/uL (ref 0.1–1.0)
Monocytes Relative: 10 %
NEUTROS ABS: 3.6 10*3/uL (ref 1.7–7.7)
Neutrophils Relative %: 62 %
PLATELETS: 227 10*3/uL (ref 150–400)
RBC: 5.03 MIL/uL (ref 4.22–5.81)
RDW: 12.8 % (ref 11.5–15.5)
WBC: 5.8 10*3/uL (ref 4.0–10.5)

## 2016-06-11 LAB — COMPREHENSIVE METABOLIC PANEL
ALBUMIN: 4.3 g/dL (ref 3.5–5.0)
ALT: 29 U/L (ref 17–63)
AST: 34 U/L (ref 15–41)
Alkaline Phosphatase: 66 U/L (ref 38–126)
Anion gap: 9 (ref 5–15)
BILIRUBIN TOTAL: 0.7 mg/dL (ref 0.3–1.2)
BUN: 10 mg/dL (ref 6–20)
CHLORIDE: 105 mmol/L (ref 101–111)
CO2: 26 mmol/L (ref 22–32)
CREATININE: 0.98 mg/dL (ref 0.61–1.24)
Calcium: 9.4 mg/dL (ref 8.9–10.3)
GFR calc Af Amer: >60 mL/min (ref >60)
GLUCOSE: 95 mg/dL (ref 65–99)
Potassium: 4.4 mmol/L (ref 3.5–5.1)
Sodium: 140 mmol/L (ref 135–145)
Total Protein: 7.1 g/dL (ref 6.5–8.1)

## 2016-06-11 LAB — PROTIME-INR
INR: 0.91 (ref 0.00–1.49)
PROTHROMBIN TIME: 12.5 s (ref 11.6–15.2)

## 2016-06-11 LAB — APTT: APTT: 27 s (ref 24–37)

## 2016-06-11 LAB — SURGICAL PCR SCREEN
MRSA, PCR: NEGATIVE
STAPHYLOCOCCUS AUREUS: NEGATIVE

## 2016-06-11 NOTE — Pre-Procedure Instructions (Signed)
Russell Simpson  06/11/2016      [name] - [place], [place] [id number] Phone: [phone] Fax: [phone]    Your procedure is scheduled on Wednesday, June 17, 2016  Report to [facility] Admitting at 11:00 A.M. ( per MD)  Call this number if you have problems the morning of surgery:  [phone]   Remember:  Do not eat food or drink liquids after midnight, Tuesday, June 16, 2016  Take these medicines the morning of surgery with A SIP OF WATER :bromocriptine (PARLODEL), finasteride (PROSCAR),  loratadine (CLARITIN), tamsulosin (FLOMAX), fluticasone (FLONASE)  nasal spray, if needed: PROAIR  Inhaler for wheezing ( Bring inhaler in  With you on day of surgery). Stop taking Aspirin, vitamins and herbal medications. Do not take any NSAIDs ie: Ibuprofen, Advil, Naproxen, BC and Goody Powder or any medication containing Aspirin such as meloxicam (MOBIC);   stop now.  Do not wear jewelry, make-up or nail polish.  Do not wear lotions, powders, or perfumes.  You may not wear deoderant.  Do not shave 48 hours prior to surgery.  Men may shave face and neck.  Do not bring valuables to the hospital.  [facility] is not responsible for any belongings or valuables.  Contacts, dentures or bridgework may not be worn into surgery.  Leave your suitcase in the car.  After surgery it may be brought to your room.  For patients admitted to the hospital, discharge time will be determined by your treatment team.  Patients discharged the day of surgery will not be allowed to drive home.   Name and phone number of your driver:  Special instructions:  [place] - Preparing for Surgery  Before surgery, you can play an important role.  Because skin is not sterile, your skin needs to be as free of germs as possible.  You can reduce the number of germs on you skin by washing with CHG (chlorahexidine gluconate) soap before surgery.  CHG is an  antiseptic cleaner which kills germs and bonds with the skin to continue killing germs even after washing.  Please DO NOT use if you have an allergy to CHG or antibacterial soaps.  If your skin becomes reddened/irritated stop using the CHG and inform your nurse when you arrive at Short Stay.  Do not shave (including legs and underarms) for at least 48 hours prior to the first CHG shower.  You may shave your face.  Please follow these instructions carefully:   1.  Shower with CHG Soap the night before surgery and the morning of Surgery.  2.  If you choose to wash your hair, wash your hair first as usual with your normal shampoo.  3.  After you shampoo, rinse your hair and body thoroughly to remove the Shampoo.  4.  Use CHG as you would any other liquid soap.  You can apply chg directly  to the skin and wash gently with scrungie or a clean washcloth.  5.  Apply the CHG Soap to your body ONLY FROM THE NECK DOWN.  Do not use on open wounds or open sores.  Avoid contact with your eyes, ears, mouth and genitals (private parts).  Wash genitals (private parts) with your normal soap.  6.  Wash thoroughly, paying special attention to the area where your surgery will be performed.  7.  Thoroughly rinse your body with warm water from the neck down.  8.  DO NOT shower/wash with your normal soap after using and rinsing off the CHG Soap.  9.  Pat yourself dry with a clean towel.            10.  Wear clean pajamas.            11.  Place clean sheets on your bed the night of your first shower and do not sleep with pets.  Day of Surgery  Do not apply any lotions/deodorants the morning of surgery.  Please wear clean clothes to the hospital/surgery center.  Please read over the following fact sheets that you were given. Pain Booklet, Coughing and Deep Breathing, MRSA Information and Surgical Site Infection Prevention

## 2016-06-11 NOTE — Progress Notes (Signed)
Pt denies SOB, chest pain, and being under the care of a cardiologist. Pt denies having a stress test, echo, and cardiac cath. Pt denies having labs drawn within the last 2 weeks. Pt denies having an EKG within the last year but confirmed that a chest x ray was completed in May 2017 : records requested from [facility]. Pt  Stated that Dr. Edrick Oh, Urology, at [facility], treats him for his" brain tumor and prolactin level checks." Pt denies being under the care of a neurologist. Spoke with Willeen Cass, NP, Anesthesia, regarding pt history of brain tumor and if pt can take bromocriptine (PARLODEL) on the morning of surgery; okay for pt to take Bromocriptine the morning of procedure per Willeen Cass, NP. Pt chart forwarded to anesthesia to review abnormal EKG.

## 2016-06-12 ENCOUNTER — Encounter (HOSPITAL_COMMUNITY): Payer: Self-pay

## 2016-06-12 NOTE — Progress Notes (Addendum)
Anesthesia Chart Review:  Pt is a 55 year old male scheduled for C3-4, C4-5 ACDF on 06/17/2016 with Phylliss Bob, MD.  PCP is Miguel Aschoff, MD.   PMH includes:  OSA, asthma. Pt it followed by urologist Edrick Oh, MD (care everywhere) for testicular hypofunction, anterior pituitary disorder, benign neoplasm of pituitary gland.   Medications include: bromocriptine, albuterol, sildenafil  CXR 04/24/16: Negative chest.   EKG 06/11/16: Sinus tachycardia (101 bpm). Possible Left atrial enlargement. Septal infarct, age undetermined. Appears stable when compared to EKD 07/19/13.   Nuclear stress test 07/20/13:  1. Exercise myocardial perfusion study with no significant ischemia.  2. No significant wall motion abnormality noted.  3. The estimated ejection fraction is 56%.  4. The left ventricular global function was normal.  5. There are no EKG changes concerning for ischemia.  6. There is no artifact noted on this study.  7. Overall, this is a Low risk scan.   If no changes, I anticipate pt can proceed with surgery as scheduled.   Willeen Cass, FNP-BC [facility] Stay [facility]/Anesthesiology Phone: [phone] 06/15/2016 1:29 PM

## 2016-06-17 ENCOUNTER — Encounter (HOSPITAL_COMMUNITY): Payer: Self-pay | Admitting: Urology

## 2016-06-17 ENCOUNTER — Observation Stay (HOSPITAL_COMMUNITY): Payer: BLUE CROSS/BLUE SHIELD

## 2016-06-17 ENCOUNTER — Ambulatory Visit (HOSPITAL_COMMUNITY): Payer: BLUE CROSS/BLUE SHIELD | Admitting: Emergency Medicine

## 2016-06-17 ENCOUNTER — Encounter (HOSPITAL_COMMUNITY)
Admission: RE | Disposition: A | Discharge status: Home / Self Care | Payer: Self-pay | Source: Ambulatory Visit | Attending: Orthopedic Surgery

## 2016-06-17 ENCOUNTER — Ambulatory Visit (HOSPITAL_COMMUNITY): Payer: BLUE CROSS/BLUE SHIELD | Admitting: Anesthesiology

## 2016-06-17 ENCOUNTER — Observation Stay (HOSPITAL_COMMUNITY)
Admission: RE | Admit: 2016-06-17 | Discharge: 2016-06-18 | Disposition: A | Discharge status: Home / Self Care | Payer: BLUE CROSS/BLUE SHIELD | Source: Ambulatory Visit | Attending: Orthopedic Surgery | Admitting: Orthopedic Surgery

## 2016-06-17 DIAGNOSIS — M50121 Cervical disc disorder at C4-C5 level with radiculopathy: Secondary | ICD-10-CM | POA: Diagnosis present

## 2016-06-17 DIAGNOSIS — Z880 Allergy status to penicillin: Secondary | ICD-10-CM | POA: Insufficient documentation

## 2016-06-17 DIAGNOSIS — F17290 Nicotine dependence, other tobacco product, uncomplicated: Secondary | ICD-10-CM | POA: Insufficient documentation

## 2016-06-17 DIAGNOSIS — Z419 Encounter for procedure for purposes other than remedying health state, unspecified: Secondary | ICD-10-CM

## 2016-06-17 DIAGNOSIS — M5410 Radiculopathy, site unspecified: Secondary | ICD-10-CM | POA: Diagnosis present

## 2016-06-17 HISTORY — PX: ANTERIOR CERVICAL DECOMP/DISCECTOMY FUSION: SHX1161

## 2016-06-17 SURGERY — ANTERIOR CERVICAL DECOMPRESSION/DISCECTOMY FUSION 2 LEVELS
Anesthesia: General

## 2016-06-17 MED ORDER — BISACODYL 5 MG PO TBEC: 5.0000 mg | DELAYED_RELEASE_TABLET | Freq: Every day | ORAL | Status: DC | PRN

## 2016-06-17 MED ORDER — TRAZODONE HCL 100 MG PO TABS
200.0000 mg | ORAL_TABLET | Freq: Every day | ORAL | Status: DC
  Administered 2016-06-17: 200 mg via ORAL
  Filled 2016-06-17: qty 2

## 2016-06-17 MED ORDER — FENTANYL CITRATE (PF) 250 MCG/5ML IJ SOLN
INTRAMUSCULAR | Status: AC
  Filled 2016-06-17: qty 5

## 2016-06-17 MED ORDER — ACETAMINOPHEN 325 MG PO TABS: 650.0000 mg | ORAL_TABLET | ORAL | Status: DC | PRN

## 2016-06-17 MED ORDER — FLEET ENEMA 7-19 GM/118ML RE ENEM: 1.0000 | ENEMA | Freq: Once | RECTAL | Status: DC | PRN

## 2016-06-17 MED ORDER — POVIDONE-IODINE 7.5 % EX SOLN: Freq: Once | CUTANEOUS | Status: DC

## 2016-06-17 MED ORDER — DIPHENHYDRAMINE HCL 25 MG PO CAPS
25.0000 mg | ORAL_CAPSULE | ORAL | Status: DC | PRN
  Administered 2016-06-17: 25 mg via ORAL
  Filled 2016-06-17: qty 1

## 2016-06-17 MED ORDER — THROMBIN 20000 UNITS EX KIT
PACK | CUTANEOUS | Status: DC | PRN
  Administered 2016-06-17: 20000 [IU] via TOPICAL

## 2016-06-17 MED ORDER — BUPIVACAINE-EPINEPHRINE 0.25% -1:200000 IJ SOLN
INTRAMUSCULAR | Status: DC | PRN
  Administered 2016-06-17: 5 mL

## 2016-06-17 MED ORDER — ACETAMINOPHEN 650 MG RE SUPP
650.0000 mg | RECTAL | Status: DC | PRN
Start: 2016-06-17 — End: 2016-06-18

## 2016-06-17 MED ORDER — SODIUM CHLORIDE 0.9% FLUSH
3.0000 mL | Freq: Two times a day (BID) | INTRAVENOUS | Status: DC
  Administered 2016-06-17: 3 mL via INTRAVENOUS

## 2016-06-17 MED ORDER — MENTHOL 3 MG MT LOZG: 1.0000 | LOZENGE | OROMUCOSAL | Status: DC | PRN

## 2016-06-17 MED ORDER — ONDANSETRON HCL 4 MG/2ML IJ SOLN: 4.0000 mg | Freq: Four times a day (QID) | INTRAMUSCULAR | Status: DC | PRN

## 2016-06-17 MED ORDER — ONDANSETRON HCL 4 MG/2ML IJ SOLN: 4.0000 mg | INTRAMUSCULAR | Status: DC | PRN

## 2016-06-17 MED ORDER — TESTOSTERONE CYPIONATE 100 MG/ML IM SOLN: 100.0000 mg | INTRAMUSCULAR | Status: DC

## 2016-06-17 MED ORDER — OXYCODONE HCL 5 MG PO TABS: 5.0000 mg | ORAL_TABLET | Freq: Once | ORAL | Status: DC | PRN

## 2016-06-17 MED ORDER — ONDANSETRON HCL 4 MG/2ML IJ SOLN
INTRAMUSCULAR | Status: DC | PRN
  Administered 2016-06-17: 4 mg via INTRAVENOUS

## 2016-06-17 MED ORDER — ALUM & MAG HYDROXIDE-SIMETH 200-200-20 MG/5ML PO SUSP: 30.0000 mL | Freq: Four times a day (QID) | ORAL | Status: DC | PRN

## 2016-06-17 MED ORDER — PHENYLEPHRINE HCL 10 MG/ML IJ SOLN
10.0000 mg | INTRAVENOUS | Status: DC | PRN
  Administered 2016-06-17: 25 ug/min via INTRAVENOUS

## 2016-06-17 MED ORDER — ROCURONIUM BROMIDE 100 MG/10ML IV SOLN
INTRAVENOUS | Status: DC | PRN
  Administered 2016-06-17: 50 mg via INTRAVENOUS

## 2016-06-17 MED ORDER — VANCOMYCIN HCL IN DEXTROSE 1-5 GM/200ML-% IV SOLN: 1000.0000 mg | INTRAVENOUS | Status: DC

## 2016-06-17 MED ORDER — FENTANYL CITRATE (PF) 100 MCG/2ML IJ SOLN
INTRAMUSCULAR | Status: DC | PRN
  Administered 2016-06-17 (×7): 50 ug via INTRAVENOUS

## 2016-06-17 MED ORDER — SILDENAFIL CITRATE 20 MG PO TABS: 20.0000 mg | ORAL_TABLET | Freq: Three times a day (TID) | ORAL | Status: DC

## 2016-06-17 MED ORDER — HYDROMORPHONE HCL 1 MG/ML IJ SOLN
INTRAMUSCULAR | Status: AC
  Filled 2016-06-17: qty 1

## 2016-06-17 MED ORDER — OXYCODONE HCL 5 MG/5ML PO SOLN: 5.0000 mg | Freq: Once | ORAL | Status: DC | PRN

## 2016-06-17 MED ORDER — PHENYLEPHRINE 40 MCG/ML (10ML) SYRINGE FOR IV PUSH (FOR BLOOD PRESSURE SUPPORT)
PREFILLED_SYRINGE | INTRAVENOUS | Status: DC | PRN
  Administered 2016-06-17: 80 ug via INTRAVENOUS
  Administered 2016-06-17: 40 ug via INTRAVENOUS

## 2016-06-17 MED ORDER — DULOXETINE HCL 30 MG PO CPEP: 90.0000 mg | ORAL_CAPSULE | Freq: Every day | ORAL | Status: DC

## 2016-06-17 MED ORDER — BUPIVACAINE-EPINEPHRINE (PF) 0.25% -1:200000 IJ SOLN
INTRAMUSCULAR | Status: AC
  Filled 2016-06-17: qty 30

## 2016-06-17 MED ORDER — HYDROMORPHONE HCL 1 MG/ML IJ SOLN
0.2500 mg | INTRAMUSCULAR | Status: DC | PRN
  Administered 2016-06-17: 1 mg via INTRAVENOUS

## 2016-06-17 MED ORDER — MIDAZOLAM HCL 5 MG/5ML IJ SOLN
INTRAMUSCULAR | Status: DC | PRN
  Administered 2016-06-17: 2 mg via INTRAVENOUS

## 2016-06-17 MED ORDER — BROMOCRIPTINE MESYLATE 2.5 MG PO TABS
2.5000 mg | ORAL_TABLET | Freq: Every day | ORAL | Status: DC
  Filled 2016-06-17: qty 1

## 2016-06-17 MED ORDER — ZOLPIDEM TARTRATE 5 MG PO TABS: 5.0000 mg | ORAL_TABLET | Freq: Every evening | ORAL | Status: DC | PRN

## 2016-06-17 MED ORDER — LORATADINE 10 MG PO TABS: 10.0000 mg | ORAL_TABLET | Freq: Every day | ORAL | Status: DC

## 2016-06-17 MED ORDER — MORPHINE SULFATE (PF) 2 MG/ML IV SOLN
1.0000 mg | INTRAVENOUS | Status: DC | PRN
  Administered 2016-06-17: 4 mg via INTRAVENOUS
  Filled 2016-06-17: qty 2

## 2016-06-17 MED ORDER — THROMBIN 20000 UNITS EX SOLR
CUTANEOUS | Status: AC
  Filled 2016-06-17: qty 20000

## 2016-06-17 MED ORDER — FLUTICASONE PROPIONATE 50 MCG/ACT NA SUSP
1.0000 | Freq: Every day | NASAL | Status: DC
  Administered 2016-06-17: 1 via NASAL
  Filled 2016-06-17: qty 16

## 2016-06-17 MED ORDER — MIDAZOLAM HCL 2 MG/2ML IJ SOLN
INTRAMUSCULAR | Status: AC
  Filled 2016-06-17: qty 2

## 2016-06-17 MED ORDER — PROPOFOL 10 MG/ML IV BOLUS
INTRAVENOUS | Status: AC
  Filled 2016-06-17: qty 40

## 2016-06-17 MED ORDER — VECURONIUM BROMIDE 10 MG IV SOLR
INTRAVENOUS | Status: DC | PRN
  Administered 2016-06-17 (×2): 3 mg via INTRAVENOUS
  Administered 2016-06-17 (×4): 2 mg via INTRAVENOUS
  Administered 2016-06-17: 1 mg via INTRAVENOUS

## 2016-06-17 MED ORDER — SENNOSIDES-DOCUSATE SODIUM 8.6-50 MG PO TABS: 1.0000 | ORAL_TABLET | Freq: Every evening | ORAL | Status: DC | PRN

## 2016-06-17 MED ORDER — SUGAMMADEX SODIUM 200 MG/2ML IV SOLN
INTRAVENOUS | Status: DC | PRN
  Administered 2016-06-17: 200 mg via INTRAVENOUS

## 2016-06-17 MED ORDER — DIAZEPAM 5 MG PO TABS
5.0000 mg | ORAL_TABLET | Freq: Four times a day (QID) | ORAL | Status: DC | PRN
  Administered 2016-06-17 – 2016-06-18 (×2): 5 mg via ORAL
  Filled 2016-06-17 (×2): qty 1

## 2016-06-17 MED ORDER — ONDANSETRON 4 MG PO TBDP
4.0000 mg | ORAL_TABLET | Freq: Three times a day (TID) | ORAL | Status: DC | PRN
  Filled 2016-06-17: qty 2

## 2016-06-17 MED ORDER — PROPOFOL 10 MG/ML IV BOLUS
INTRAVENOUS | Status: DC | PRN
  Administered 2016-06-17: 170 mg via INTRAVENOUS

## 2016-06-17 MED ORDER — DOCUSATE SODIUM 100 MG PO CAPS
100.0000 mg | ORAL_CAPSULE | Freq: Two times a day (BID) | ORAL | Status: DC
  Administered 2016-06-17: 100 mg via ORAL
  Filled 2016-06-17: qty 1

## 2016-06-17 MED ORDER — MONTELUKAST SODIUM 10 MG PO TABS
10.0000 mg | ORAL_TABLET | Freq: Every day | ORAL | Status: DC
  Administered 2016-06-17: 10 mg via ORAL
  Filled 2016-06-17: qty 1

## 2016-06-17 MED ORDER — EPHEDRINE SULFATE-NACL 50-0.9 MG/10ML-% IV SOSY
PREFILLED_SYRINGE | INTRAVENOUS | Status: DC | PRN
  Administered 2016-06-17: 5 mg via INTRAVENOUS
  Administered 2016-06-17 (×2): 2.5 mg via INTRAVENOUS
  Administered 2016-06-17: 5 mg via INTRAVENOUS
  Administered 2016-06-17: 2.5 mg via INTRAVENOUS

## 2016-06-17 MED ORDER — ALBUTEROL SULFATE (2.5 MG/3ML) 0.083% IN NEBU: 3.0000 mL | INHALATION_SOLUTION | Freq: Four times a day (QID) | RESPIRATORY_TRACT | Status: DC | PRN

## 2016-06-17 MED ORDER — 0.9 % SODIUM CHLORIDE (POUR BTL) OPTIME
TOPICAL | Status: DC | PRN
  Administered 2016-06-17 (×2): 1000 mL

## 2016-06-17 MED ORDER — SODIUM CHLORIDE 0.9% FLUSH: 3.0000 mL | INTRAVENOUS | Status: DC | PRN

## 2016-06-17 MED ORDER — LIDOCAINE 2% (20 MG/ML) 5 ML SYRINGE
INTRAMUSCULAR | Status: DC | PRN
  Administered 2016-06-17: 60 mg via INTRAVENOUS

## 2016-06-17 MED ORDER — VANCOMYCIN HCL IN DEXTROSE 1-5 GM/200ML-% IV SOLN
INTRAVENOUS | Status: AC
  Administered 2016-06-17: 1000 mg via INTRAVENOUS
  Filled 2016-06-17: qty 200

## 2016-06-17 MED ORDER — OXYCODONE-ACETAMINOPHEN 5-325 MG PO TABS
1.0000 | ORAL_TABLET | ORAL | Status: DC | PRN
  Administered 2016-06-17 – 2016-06-18 (×3): 2 via ORAL
  Filled 2016-06-17 (×2): qty 2

## 2016-06-17 MED ORDER — VANCOMYCIN HCL IN DEXTROSE 1-5 GM/200ML-% IV SOLN
1000.0000 mg | Freq: Once | INTRAVENOUS | Status: AC
  Administered 2016-06-18: 1000 mg via INTRAVENOUS

## 2016-06-17 MED ORDER — LACTATED RINGERS IV SOLN
INTRAVENOUS | Status: DC
  Administered 2016-06-17 (×3): via INTRAVENOUS

## 2016-06-17 MED ORDER — FINASTERIDE 5 MG PO TABS
5.0000 mg | ORAL_TABLET | Freq: Every day | ORAL | Status: DC
Start: 2016-06-18 — End: 2016-06-18

## 2016-06-17 MED ORDER — TAMSULOSIN HCL 0.4 MG PO CAPS
0.4000 mg | ORAL_CAPSULE | Freq: Every day | ORAL | Status: DC
  Administered 2016-06-17: 0.4 mg via ORAL
  Filled 2016-06-17: qty 1

## 2016-06-17 MED ORDER — PHENOL 1.4 % MT LIQD: 1.0000 | OROMUCOSAL | Status: DC | PRN

## 2016-06-17 MED ORDER — OXYCODONE-ACETAMINOPHEN 5-325 MG PO TABS
ORAL_TABLET | ORAL | Status: AC
  Filled 2016-06-17: qty 2

## 2016-06-17 MED ORDER — SODIUM CHLORIDE 0.9 % IV SOLN: 250.0000 mL | INTRAVENOUS | Status: DC

## 2016-06-17 SURGICAL SUPPLY — 79 items
APL SKNCLS STERI-STRIP NONHPOA (GAUZE/BANDAGES/DRESSINGS) ×1
BENZOIN TINCTURE PRP APPL 2/3 (GAUZE/BANDAGES/DRESSINGS) ×2 IMPLANT
BIT DRILL NEURO 2X3.1 SFT TUCH (MISCELLANEOUS) ×1 IMPLANT
BIT DRILL SRG 14X2.2XFLT CHK (BIT) IMPLANT
BIT DRL SRG 14X2.2XFLT CHK (BIT) ×1
BLADE SURG 15 STRL LF DISP TIS (BLADE) ×1 IMPLANT
BLADE SURG 15 STRL SS (BLADE) ×2
BLADE SURG ROTATE 9660 (MISCELLANEOUS) ×1 IMPLANT
BUR MATCHSTICK NEURO 3.0 LAGG (BURR) IMPLANT
CARTRIDGE OIL MAESTRO DRILL (MISCELLANEOUS) ×1 IMPLANT
COLLAR CERV LO CONTOUR FIRM DE (SOFTGOODS) IMPLANT
CORDS BIPOLAR (ELECTRODE) ×2 IMPLANT
COVER SURGICAL LIGHT HANDLE (MISCELLANEOUS) ×2 IMPLANT
CRADLE DONUT ADULT HEAD (MISCELLANEOUS) ×2 IMPLANT
DECANTER SPIKE VIAL GLASS SM (MISCELLANEOUS) ×1 IMPLANT
DIFFUSER DRILL AIR PNEUMATIC (MISCELLANEOUS) ×2 IMPLANT
DRAIN JACKSON RD 7FR 3/32 (WOUND CARE) IMPLANT
DRAPE C-ARM 42X72 X-RAY (DRAPES) ×2 IMPLANT
DRAPE POUCH INSTRU U-SHP 10X18 (DRAPES) ×2 IMPLANT
DRAPE SURG 17X23 STRL (DRAPES) ×6 IMPLANT
DRILL BIT SKYLINE 14MM (BIT) ×2
DRILL NEURO 2X3.1 SOFT TOUCH (MISCELLANEOUS) ×4
DURAPREP 26ML APPLICATOR (WOUND CARE) ×2 IMPLANT
ELECT COATED BLADE 2.86 ST (ELECTRODE) ×2 IMPLANT
ELECT REM PT RETURN 9FT ADLT (ELECTROSURGICAL) ×2
ELECTRODE REM PT RTRN 9FT ADLT (ELECTROSURGICAL) ×1 IMPLANT
EVACUATOR SILICONE 100CC (DRAIN) IMPLANT
GAUZE SPONGE 4X4 12PLY STRL (GAUZE/BANDAGES/DRESSINGS) ×2 IMPLANT
GAUZE SPONGE 4X4 16PLY XRAY LF (GAUZE/BANDAGES/DRESSINGS) ×2 IMPLANT
GLOVE BIO SURGEON STRL SZ7 (GLOVE) ×2 IMPLANT
GLOVE BIO SURGEON STRL SZ8 (GLOVE) ×2 IMPLANT
GLOVE BIOGEL PI IND STRL 7.5 (GLOVE) ×2 IMPLANT
GLOVE BIOGEL PI IND STRL 8 (GLOVE) ×1 IMPLANT
GLOVE BIOGEL PI INDICATOR 7.5 (GLOVE) ×2
GLOVE BIOGEL PI INDICATOR 8 (GLOVE) ×1
GOWN STRL REUS W/ TWL LRG LVL3 (GOWN DISPOSABLE) ×1 IMPLANT
GOWN STRL REUS W/ TWL XL LVL3 (GOWN DISPOSABLE) ×1 IMPLANT
GOWN STRL REUS W/TWL LRG LVL3 (GOWN DISPOSABLE) ×2
GOWN STRL REUS W/TWL XL LVL3 (GOWN DISPOSABLE) ×2
INTERLOCK LRDTC CRVCL VBR 7MM (Bone Implant) IMPLANT
INTERLOCK LRDTC CRVCL VBR 8MM (Peek) IMPLANT
IV CATH 14GX2 1/4 (CATHETERS) ×2 IMPLANT
KIT BASIN OR (CUSTOM PROCEDURE TRAY) ×2 IMPLANT
KIT ROOM TURNOVER OR (KITS) ×2 IMPLANT
LORDOTIC CERVICAL VBR 7MM SM (Bone Implant) ×2 IMPLANT
LORDOTIC CERVICAL VBR 8MM SM (Peek) ×2 IMPLANT
MANIFOLD NEPTUNE II (INSTRUMENTS) ×1 IMPLANT
NDL SPNL 20GX3.5 QUINCKE YW (NEEDLE) ×1 IMPLANT
NEEDLE 27GAX1X1/2 (NEEDLE) ×2 IMPLANT
NEEDLE SPNL 20GX3.5 QUINCKE YW (NEEDLE) ×2 IMPLANT
NS IRRIG 1000ML POUR BTL (IV SOLUTION) ×2 IMPLANT
OIL CARTRIDGE MAESTRO DRILL (MISCELLANEOUS) ×2
PACK ORTHO CERVICAL (CUSTOM PROCEDURE TRAY) ×2 IMPLANT
PAD ARMBOARD 7.5X6 YLW CONV (MISCELLANEOUS) ×4 IMPLANT
PATTIES SURGICAL .5 X.5 (GAUZE/BANDAGES/DRESSINGS) ×1 IMPLANT
PATTIES SURGICAL .5 X1 (DISPOSABLE) ×1 IMPLANT
PIN DISTRACTION 14 (PIN) ×2 IMPLANT
PLATE TWO LEVEL SKYLINE 30MM (Plate) ×1 IMPLANT
PUTTY BONE DBX 2.5 MIS (Bone Implant) ×1 IMPLANT
SCREW SKYLINE VAR OS 14MM (Screw) ×6 IMPLANT
SPONGE GAUZE 4X4 12PLY STER LF (GAUZE/BANDAGES/DRESSINGS) ×1 IMPLANT
SPONGE INTESTINAL PEANUT (DISPOSABLE) ×5 IMPLANT
SPONGE LAP 4X18 X RAY DECT (DISPOSABLE) ×1 IMPLANT
SPONGE SURGIFOAM ABS GEL 100 (HEMOSTASIS) ×2 IMPLANT
STRIP CLOSURE SKIN 1/2X4 (GAUZE/BANDAGES/DRESSINGS) ×2 IMPLANT
SURGIFLO W/THROMBIN 8M KIT (HEMOSTASIS) IMPLANT
SUT MNCRL AB 4-0 PS2 18 (SUTURE) ×1 IMPLANT
SUT SILK 4 0 (SUTURE)
SUT SILK 4-0 18XBRD TIE 12 (SUTURE) IMPLANT
SUT VIC AB 2-0 CT2 18 VCP726D (SUTURE) ×2 IMPLANT
SYR BULB IRRIGATION 50ML (SYRINGE) ×2 IMPLANT
SYR CONTROL 10ML LL (SYRINGE) ×5 IMPLANT
TAPE CLOTH 4X10 WHT NS (GAUZE/BANDAGES/DRESSINGS) ×2 IMPLANT
TAPE CLOTH SURG 4X10 WHT LF (GAUZE/BANDAGES/DRESSINGS) ×1 IMPLANT
TAPE UMBILICAL COTTON 1/8X30 (MISCELLANEOUS) ×2 IMPLANT
TOWEL OR 17X24 6PK STRL BLUE (TOWEL DISPOSABLE) ×2 IMPLANT
TOWEL OR 17X26 10 PK STRL BLUE (TOWEL DISPOSABLE) ×2 IMPLANT
WATER STERILE IRR 1000ML POUR (IV SOLUTION) ×1 IMPLANT
YANKAUER SUCT BULB TIP NO VENT (SUCTIONS) ×2 IMPLANT

## 2016-06-17 NOTE — Op Note (Signed)
NAMEJAYVIN, Russell Simpson NO.:  [id number]  MEDICAL RECORD NO.:  [id number]  LOCATION:  3C05C                        FACILITY:  [facility]  PHYSICIAN:  Phylliss Bob, MD      DATE OF BIRTH:  [birth year]  DATE OF PROCEDURE:  06/17/2016                              OPERATIVE REPORT   PREOPERATIVE DIAGNOSES: 1. Bilateral cervical radiculopathy. 2. Cervical degenerative disc disease; C3-4, C4-5. 3. Status post previous C5-6 fusion.  POSTOPERATIVE DIAGNOSES: 1. Bilateral cervical radiculopathy. 2. Cervical degenerative disc disease; C3-4, C4-5. 3. Status post previous C5-6 fusion.  PROCEDURE: 1. Anterior cervical decompression and fusion C3-4, C4-5. 2. Placement of anterior instrumentation, C3 to C5. 3. Removal of anterior instrumentation, C5-6. 4. Insertion of interbody device x2 (Titan intervertebral spacers). 5. Use of morselized allograft-DBS mix. 6. Intraoperative use of fluoroscopy.  SURGEON:  Phylliss Bob, MD.  ASSISTANTPricilla Holm, PA-C.  ANESTHESIA:  General endotracheal anesthesia.  COMPLICATIONS:  None.  DISPOSITION:  Stable.  ESTIMATED BLOOD LOSS:  Minimal.  INDICATIONS FOR SURGERY:  Briefly, Mr. Simpson is a pleasant 55 year old male, who did present to me with ongoing pain in the right greater than left arm as well as weakness.  The patient was status post a previous C5- 6 fusion.  An MRI did reveal stenosis at the C3-4 and C4-5 levels. Given the patient's ongoing pain, we did discuss proceeding with surgical intervention.  The patient was fully aware of the risks and limitations and did wish to proceed.  OPERATIVE DETAILS:  On June 17, 2016, the patient was brought to surgery and general endotracheal anesthesia was administered.  The patient was placed supine on the hospital bed.  The patient's arms were secured to his sides and all bony prominences were padded.  The neck was prepped and draped.  A left-sided transverse incision was then  made.  The platysma was incised.  A Smith-Robinson approach was used, and the anterior spine was noted.  The previously placed anterior plate at the 075-GRM level was meticulously removed.  This was a very time consuming portion of the procedure, as there was significant bone growth noted in and around the plate.  I was, however, able to remove the osteophytes surrounding the plate, after which point, the 4 vertebral body screws were removed, as was the plate.  Bone wax was then placed in the vertebral body screw holes.  I then turned my attention to the C4-5 intervertebral level.  A self-retaining retractor was placed.  Caspar pins were placed into the C4 and C5 vertebral bodies and distraction was applied.  A thorough and complete C4-5 intervertebral diskectomy was performed.  A bilateral neural foraminal decompression was then performed as well.  Of note, there was significant concavity of the lower endplate of the C4 vertebral body.  I did partially create parallel at the lower C4 endplate.  The endplates were then prepared, and I did place a series of intervertebral trials.  I did elect to place a 7 mm lordotic intervertebral spacer.  The spacer was placed anteriorly across the intervertebral space, in order to help restore the lordosis, as the patient's overall alignment was rather kyphotic.  I was very  pleased with the press-fit of the spacer.  The lower Caspar pin was then removed and bone wax was placed in its place.  A new Caspar pin was placed into the C3 vertebral body and again, distraction was applied. Once again, a thorough and complete C3-4 intervertebral diskectomy and bilateral neuroforaminal decompression were performed.  The endplates were prepared, and the appropriate size interbody spacer was packed with DBS mix and tamped into position.  I was very pleased with the press-fit of both the spacers.  The Caspar pins were then removed.  The bone wax was placed in that  place.  The wound was copiously irrigated.  The appropriate sized anterior cervical plate was then placed over the anterior cervical spine.  A 14 mm variable angle screws were placed, 2 in each vertebral body from C3 to C5 for a total of 6 vertebral body screws.  I was pleased with the purchase of the screws.  The screws were then locked to the plate using the Cam locking mechanism.  Final fluoroscopic views were obtained, I was pleased with the appearance. The platysma was then closed using 2-0 Vicryl, and the skin was closed using 4-0 Monocryl.  Benzoin and Steri-Strips were applied followed by sterile dressing.  All instrument counts were correct at the termination of the procedure.  Of note, Pricilla Holm was my assistant throughout surgery and did aid in retraction, suctioning, and closure from start to finish.     Phylliss Bob, MD     MD/MEDQ  D:  06/17/2016  T:  06/17/2016  Job:  [id number]

## 2016-06-17 NOTE — Anesthesia Postprocedure Evaluation (Signed)
Anesthesia Post Note  Patient: Russell Simpson  Procedure(s) Performed: Procedure(s) (LRB): ANTERIOR CERVICAL DECOMPRESSION FUSION, CERVICAL 3-4, CERVICAL 4-5 WITH INSTRUMENTATION AND ALLOGRAFT (N/A)  Patient location during evaluation: PACU Anesthesia Type: General Level of consciousness: awake and alert Pain management: pain level controlled Vital Signs Assessment: post-procedure vital signs reviewed and stable Respiratory status: spontaneous breathing, nonlabored ventilation, respiratory function stable and patient connected to nasal cannula oxygen Cardiovascular status: blood pressure returned to baseline and stable Postop Assessment: no signs of nausea or vomiting Anesthetic complications: no    Last Vitals:  Filed Vitals:   06/17/16 1910 06/17/16 1929  BP: 142/92 148/90  Pulse: 106 119  Temp:  36.8 C  Resp:  18    Last Pain:  Filed Vitals:   06/17/16 2102  PainSc: 4                  Leona Alen DAVID

## 2016-06-17 NOTE — Progress Notes (Signed)
PHARMACIST - PHYSICIAN COMMUNICATION DR:   Lynann Bologna or Pricilla Holm CONCERNING:  Sildenafil order  Spoke with patient and he uses this medication for erectile dysfunction (not for [facility]). I have discontinued this order as this is not needed inpatient.  Please call for concerns. [place], PharmD, BCPS Clinical Pharmacist Pager: [phone] 06/17/2016 7:41 PM

## 2016-06-17 NOTE — H&P (Signed)
PREOPERATIVE H&P  Chief Complaint: R arm pain  HPI: Russell Simpson is a 55 y.o. male who presents with ongoing pain in the arm  MRI reveals NF stenosis at C3/4 and C4/5, above the patient previous C5/6 fusion  Patient has failed multiple forms of conservative care and continues to have pain (see office notes for additional details regarding the patient's full course of treatment)  Past Medical History  Diagnosis Date  . Depression   . Asthma   . Environmental and seasonal allergies   . Pneumonia   . Arthritis   . Chronic pain     right arm  . Sleep apnea     does not wear CPAP ; uses humidifier instead  . Brain tumor (benign) ([place])     benign pituitary neoplasm  . Anterior pituitary disorder [facility])    Past Surgical History  Procedure Laterality Date  . Neck surgery  2009  . Back surgery     Social History   Social History  . Marital Status: Married    Spouse Name: N/A  . Number of Children: N/A  . Years of Education: N/A   Social History Main Topics  . Smoking status: Light Tobacco Smoker    Types: Cigars  . Smokeless tobacco: Never Used     Comment: " sometimes during the day and at night"  . Alcohol Use: 0.0 oz/week    0 Standard drinks or equivalent per week     Comment: occasional beer  . Drug Use: No  . Sexual Activity: Not on file   Other Topics Concern  . Not on file   Social History Narrative   Family History  Problem Relation Age of Onset  . Diabetes Mother   . Diabetes Other    Allergies  Allergen Reactions  . Penicillins Anaphylaxis  . Tetanus Toxoids Swelling  . Lisinopril     Other reaction(s): Cough Other reaction(s): Cough  . Losartan     Other reaction(s): Muscle Pain Other reaction(s): Muscle Pain  . Tetanus Toxoid   . Codeine Nausea Only   Prior to Admission medications   Medication Sig Start Date End Date Taking? Authorizing Provider  bromocriptine (PARLODEL) 2.5 MG tablet Take 2.5 mg by mouth daily.    Yes  Historical Provider, MD  DULoxetine (CYMBALTA) 30 MG capsule Take 3 capsules once daily Patient taking differently: Take 90 mg by mouth daily.  08/28/15  Yes Carmon Ginsberg, PA  finasteride (PROSCAR) 5 MG tablet Take 5 mg by mouth daily. 05/07/16  Yes Historical Provider, MD  fluticasone (FLONASE) 50 MCG/ACT nasal spray Place 1 spray into the nose daily.  01/06/14  Yes Historical Provider, MD  loratadine (CLARITIN) 10 MG tablet Take 10 mg by mouth daily.  12/18/14  Yes Historical Provider, MD  meloxicam (MOBIC) 15 MG tablet Take 15 mg by mouth daily. 05/06/16  Yes Historical Provider, MD  montelukast (SINGULAIR) 10 MG tablet TAKE 1 TABLET BY MOUTH AT BEDTIME 04/09/16  Yes Birdie Sons, MD  PROAIR HFA 108 [phone] Base) MCG/ACT inhaler Inhale 1 puff into the lungs every 6 (six) hours as needed for wheezing.  05/11/16  Yes Historical Provider, MD  sildenafil (REVATIO) 20 MG tablet 3-5 tablets daily as needed 08/16/15  Yes Historical Provider, MD  tamsulosin (FLOMAX) 0.4 MG CAPS capsule Take 0.4 mg by mouth daily.  05/30/15  Yes Historical Provider, MD  testosterone cypionate (DEPOTESTOTERONE CYPIONATE) 100 MG/ML injection Inject 100 mg into the muscle once  a week.    Yes Historical Provider, MD  traZODone (DESYREL) 100 MG tablet Two at bedtime Patient taking differently: Take 200 mg by mouth at bedtime.  09/19/15  Yes Carmon Ginsberg, PA     All other systems have been reviewed and were otherwise negative with the exception of those mentioned in the HPI and as above.  Physical Exam: There were no vitals filed for this visit.  General: Alert, no acute distress Cardiovascular: No pedal edema Respiratory: No cyanosis, no use of accessory musculature Skin: No lesions in the area of chief complaint Neurologic: Sensation intact distally Psychiatric: Patient is competent for consent with normal mood and affect Lymphatic: No axillary or cervical lymphadenopathy  MUSCULOSKELETAL: + R deltoid and biceps weakness  at 4/5  Assessment/Plan: Right arm pain  Plan for Procedure(s): ANTERIOR CERVICAL DECOMPRESSION FUSION, CERVICAL 3-4, CERVICAL 4-5 WITH INSTRUMENTATION AND ALLOGRAFT   Sinclair Ship, MD 06/17/2016 8:02 AM

## 2016-06-17 NOTE — Progress Notes (Signed)
Pharmacy Antibiotic Note  Russell Simpson is a 55 y.o. male admitted on 06/17/2016 with surgical prophylaxis.  Pharmacy has been consulted for Vancomycin dosing.  Pt is to receive 1 dose, 12hr post-op as pt has no drain (verified with RN).  Plan: Vancomycin 1000mg  IV x 1 at 0600  Height: 6\' 2"  (188 cm) Weight: 206 lb 6 oz (93.611 kg) IBW/kg (Calculated) : 82.2  Temp (24hrs), Avg:98.3 F (36.8 C), Min:98 F (36.7 C), Max:98.6 F (37 C)   Recent Labs Lab 06/11/16 1353  WBC 5.8  CREATININE 0.98    Estimated Creatinine Clearance: 101.4 mL/min (by C-G formula based on Cr of 0.98).    Allergies  Allergen Reactions  . Penicillins Anaphylaxis  . Tetanus Toxoids Swelling  . Lisinopril     Other reaction(s): Cough Other reaction(s): Cough  . Losartan     Other reaction(s): Muscle Pain Other reaction(s): Muscle Pain  . Tetanus Toxoid   . Codeine Nausea Only     Thank you for allowing pharmacy to be a part of this patient's care.  Gracy Bruins, PharmD Clinical Pharmacist [facility]

## 2016-06-17 NOTE — Anesthesia Preprocedure Evaluation (Signed)
Anesthesia Evaluation  Patient identified by MRN, date of birth, ID band Patient awake    Reviewed: Allergy & Precautions, H&P , NPO status , Patient's Chart, lab work & pertinent test results  Airway Mallampati: II   Neck ROM: full    Dental   Pulmonary asthma , sleep apnea , Current Smoker,    breath sounds clear to auscultation       Cardiovascular hypertension,  Rhythm:regular Rate:Normal     Neuro/Psych PSYCHIATRIC DISORDERS Anxiety Depression    GI/Hepatic   Endo/Other  Anterior pituitary disorder  Renal/GU      Musculoskeletal  (+) Arthritis ,   Abdominal   Peds  Hematology   Anesthesia Other Findings   Reproductive/Obstetrics                             Anesthesia Physical Anesthesia Plan  ASA: II  Anesthesia Plan: General   Post-op Pain Management:    Induction: Intravenous  Airway Management Planned: Oral ETT  Additional Equipment:   Intra-op Plan:   Post-operative Plan: Extubation in OR  Informed Consent: I have reviewed the patients History and Physical, chart, labs and discussed the procedure including the risks, benefits and alternatives for the proposed anesthesia with the patient or authorized representative who has indicated his/her understanding and acceptance.     Plan Discussed with: CRNA, Anesthesiologist and Surgeon  Anesthesia Plan Comments:         Anesthesia Quick Evaluation

## 2016-06-17 NOTE — Progress Notes (Signed)
Orthopedic Tech Progress Note Patient Details:  Russell Simpson 04/27/1961 [id number]  Ortho Devices Type of Ortho Device: [place] cervical collar Ortho Device/Splint Interventions: Ordered, Application   Karolee Stamps 06/17/2016, 10:17 PM

## 2016-06-17 NOTE — Transfer of Care (Signed)
Immediate Anesthesia Transfer of Care Note  Patient: Russell Simpson  Procedure(s) Performed: Procedure(s) with comments: ANTERIOR CERVICAL DECOMPRESSION FUSION, CERVICAL 3-4, CERVICAL 4-5 WITH INSTRUMENTATION AND ALLOGRAFT (N/A) - ANTERIOR CERVICAL DECOMPRESSION FUSION, CERVICAL 3-4, CERVICAL 4-5 WITH INSTRUMENTATION AND ALLOGRAFT  Patient Location: PACU  Anesthesia Type:General  Level of Consciousness: awake, alert, cooperative, oriented  Airway & Oxygen Therapy: Patient Spontanous Breathing and Patient connected to nasal cannula oxygen  Post-op Assessment: Report given to RN, Post -op Vital signs reviewed and stable and Patient moving all extremities X 4  Post vital signs: Reviewed and stable  Last Vitals:  Filed Vitals:   06/17/16 1054  BP: 142/103  Pulse: 88  Temp: 37 C  Resp: 20    Last Pain:  Filed Vitals:   06/17/16 1057  PainSc: 7          Complications: No apparent anesthesia complications

## 2016-06-17 NOTE — Anesthesia Procedure Notes (Addendum)
Procedure Name: Intubation Date/Time: 06/17/2016 3:18 PM Performed by: Bethel Born Pre-anesthesia Checklist: Patient identified, Emergency Drugs available, Suction available and Patient being monitored Patient Re-evaluated:Patient Re-evaluated prior to inductionOxygen Delivery Method: Circle system utilized Preoxygenation: Pre-oxygenation with 100% oxygen Intubation Type: IV induction Ventilation: Mask ventilation without difficulty and Oral airway inserted - appropriate to patient size Laryngoscope Size: Glidescope and 4 Grade View: Grade I Tube type: Oral Tube size: 7.5 mm Number of attempts: 1 Airway Equipment and Method: Rigid stylet and Video-laryngoscopy Placement Confirmation: ETT inserted through vocal cords under direct vision,  positive ETCO2 and breath sounds checked- equal and bilateral Secured at: 23 cm Tube secured with: Tape Dental Injury: Teeth and Oropharynx as per pre-operative assessment  Difficulty Due To: Difficulty was anticipated and Difficult Airway- due to reduced neck mobility Comments: Head and neck remained in neutral position during induction and intubation.

## 2016-06-18 DIAGNOSIS — M50121 Cervical disc disorder at C4-C5 level with radiculopathy: Secondary | ICD-10-CM | POA: Diagnosis not present

## 2016-06-18 MED FILL — Thrombin For Soln 20000 Unit: CUTANEOUS | Qty: 1 | Status: AC

## 2016-06-18 NOTE — Discharge Summary (Signed)
Patient ID: Russell Simpson MRN: [id number] DOB/AGE: [birth year] 55 y.o.  Admit date: 06/17/2016 Discharge date: 06/18/2016  Admission Diagnoses:  Active Problems:   Radiculopathy   Discharge Diagnoses:  Same  Past Medical History  Diagnosis Date  . Depression   . Asthma   . Environmental and seasonal allergies   . Pneumonia   . Arthritis   . Chronic pain     right arm  . Sleep apnea     does not wear CPAP ; uses humidifier instead  . 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 male who was admitted 06/17/2016 for operative treatment of radiculopathy. Patient has severe unremitting pain that affects sleep, daily activities, and work/hobbies. After pre-op clearance the patient was taken to the operating room on 06/17/2016 - 06/18/2016 and underwent  Procedure(s): ANTERIOR CERVICAL DECOMPRESSION FUSION, CERVICAL 3-4, CERVICAL 4-5 WITH INSTRUMENTATION AND ALLOGRAFT.    Patient was given perioperative antibiotics: Anti-infectives    Start     Dose/Rate Route Frequency Ordered Stop   06/18/16 0600  vancomycin (VANCOCIN) IVPB 1000 mg/200 mL premix     1,000 mg 200 mL/hr over 60 Minutes Intravenous  Once 06/17/16 1954 06/18/16 0700   06/17/16 1043  vancomycin (VANCOCIN) 1-5 GM/200ML-% IVPB    Comments:  Rosenberger, Meredit: cabinet override      06/17/16 1043 06/17/16 1610   06/17/16 1040  vancomycin (VANCOCIN) IVPB 1000 mg/200 mL premix  Status:  Discontinued     1,000 mg 200 mL/hr over 60 Minutes Intravenous On call to O.R. 06/17/16 1040 06/17/16 1918       Patient was given sequential compression devices, early ambulation to prevent DVT.  Patient benefited  maximally from hospital stay and there were no complications.    Recent vital signs: Patient Vitals for the past 24 hrs:  BP Temp Temp src Pulse Resp SpO2  06/18/16 0800 128/77 mmHg 97.6 F (36.4 C) Oral 97 20 96 %  06/18/16 0400 128/85 mmHg 97.6 F (36.4 C) Oral 92 20 94 %  06/17/16 2333 (!) 148/95 mmHg 97.7 F (36.5 C) Oral 95 20 94 %  06/17/16 1929 (!) 148/90 mmHg 98.2 F (36.8 C) - (!) 119 18 96 %  06/17/16 1910 (!) 142/92 mmHg - - (!) 106 - 93 %  06/17/16 1900 (!) 142/92 mmHg - - (!) 114 16 94 %  06/17/16 1848 - - - (!) 103 (!) 9 92 %  06/17/16 1836 - - - (!) 112 15 94 %  06/17/16 1828 - 13 F (36.7 C) - - - -     Discharge Medications:     Medication List    TAKE these medications        bromocriptine 2.5 MG tablet  Commonly known as:  PARLODEL  Take 2.5 mg by mouth daily.     DULoxetine 30 MG capsule  Commonly known as:  CYMBALTA  Take 3 capsules once daily     finasteride 5 MG tablet  Commonly known as:  PROSCAR  Take 5 mg by mouth daily.     fluticasone 50 MCG/ACT nasal spray  Commonly known as:  FLONASE  Place 1 spray into the nose daily.  loratadine 10 MG tablet  Commonly known as:  CLARITIN  Take 10 mg by mouth daily.     montelukast 10 MG tablet  Commonly known as:  SINGULAIR  TAKE 1 TABLET BY MOUTH AT BEDTIME     PROAIR HFA 108 (90 Base) MCG/ACT inhaler  Generic drug:  albuterol  Inhale 1 puff into the lungs every 6 (six) hours as needed for wheezing.     sildenafil 20 MG tablet  Commonly known as:  REVATIO  3-5 tablets daily as needed for erectile dysfunction     tamsulosin 0.4 MG Caps capsule  Commonly known as:  FLOMAX  Take 0.4 mg by mouth daily.     testosterone cypionate 100 MG/ML injection  Commonly known as:  DEPOTESTOTERONE CYPIONATE  Inject 100 mg into the muscle once a week.     traZODone 100 MG tablet  Commonly known as:  DESYREL  Two at bedtime        Diagnostic Studies: Dg Cervical Spine 1 View  06/17/2016   CLINICAL DATA:  ACDF at C3-4 EXAM: CERVICAL SPINE 1 VIEW COMPARISON:  MRI 10/18/2015 FINDINGS: Anterior plate seen previously at C5-6 has been removed in the interval. Patient has had anterior cervical discectomy at C3-4 and C4-5 with anterior plate extending from C3-C5. Bony alignment is stable. No evidence for immediate hardware complications. Surgical sponge noted in the operative bed. IMPRESSION: Intraoperative evaluation during ACDF with plating from C3-C5. No evidence for immediate complicating features. Electronically Signed   By: Misty Stanley M.D.   On: 06/17/2016 19:04   Dg C-arm 1-60 Min  06/17/2016  CLINICAL DATA:  ACDF at C3-4 EXAM: CERVICAL SPINE 1 VIEW COMPARISON:  MRI 10/18/2015 FINDINGS: Anterior plate seen previously at C5-6 has been removed in the interval. Patient has had anterior cervical discectomy at C3-4 and C4-5 with anterior plate extending from C3-C5. Bony alignment is stable. No evidence for immediate hardware complications. Surgical sponge noted in the operative bed. IMPRESSION: Intraoperative evaluation during ACDF with plating from C3-C5. No evidence for immediate complicating features. Electronically Signed   By: Misty Stanley M.D.   On: 06/17/2016 19:04    Disposition: 01-Home or Self Care   POD #1 s/p C3-5 ACDF doing well  - encourage ambulation - Percocet for pain, Valium for muscle spasms -Written scripts for pain signed and in chart -D/C instructions sheet printed and in chart -D/C today  -F/U in office 2 weeks   Signed: Justice Britain 06/18/2016, 12:38 PM

## 2016-06-18 NOTE — Progress Notes (Signed)
Patient alert and oriented, mae's well, voiding adequate amount of urine, swallowing without difficulty, no c/o pain. Patient discharged home with family. Script and discharged instructions given to patient. Patient and family stated understanding of d/c instructions given and has an appointment with MD. 

## 2016-06-18 NOTE — Progress Notes (Signed)
    Patient doing well Patient reports significant improvement in beck and arm pain, looks very well and comfortable   Physical Exam: Filed Vitals:   06/17/16 2333 06/18/16 0400  BP: 148/95 128/85  Pulse: 95 92  Temp: 97.7 F (36.5 C) 97.6 F (36.4 C)  Resp: 20 20   Neck soft/supple Dressing in place NVI  POD #1 s/p C3-5 ACDF doing well  - encourage ambulation - Percocet for pain, Valium for muscle spasms - likely d/c home later today

## 2016-06-19 ENCOUNTER — Encounter (HOSPITAL_COMMUNITY): Payer: Self-pay | Admitting: Orthopedic Surgery

## 2016-06-22 ENCOUNTER — Encounter (HOSPITAL_COMMUNITY): Payer: Self-pay | Admitting: Orthopedic Surgery

## 2016-07-03 ENCOUNTER — Encounter: Payer: Self-pay | Admitting: Family Medicine

## 2016-07-03 ENCOUNTER — Ambulatory Visit (INDEPENDENT_AMBULATORY_CARE_PROVIDER_SITE_OTHER): Payer: BLUE CROSS/BLUE SHIELD | Admitting: Family Medicine

## 2016-07-03 VITALS — BP 142/92 | HR 117 | Temp 98.5°F | Resp 18 | Wt 202.8 lb

## 2016-07-03 DIAGNOSIS — L509 Urticaria, unspecified: Secondary | ICD-10-CM

## 2016-07-03 MED ORDER — HYDROXYZINE HCL 25 MG PO TABS: 25.0000 mg | ORAL_TABLET | Freq: Four times a day (QID) | ORAL | 0 refills | Status: DC | PRN

## 2016-07-03 NOTE — Progress Notes (Signed)
Subjective:     Patient ID: Russell Simpson, male   DOB: 1961/04/21, 55 y.o.   MRN: [id number]  HPI  Chief Complaint  Patient presents with  . Rash    Patient comes in office today with concerns of rash that first presented this morning. Patient states that he noticed red bumps and welts under his right underarm and has spread now throughout entire body including feet and buttock. Patient denies any new medication or food products that could have caused reaction, associated with rash patient complains of having the "shakes."  He is currently taking tramadol occasionally after cervical decompression surgery 06/17/16. States he is not on antibiotics. He is taking an otc antihistamine daily for allergies. Reports he has put a rx cream on his right underarm welts and they have gone down.   Review of Systems     Objective:   Physical Exam  Constitutional: He appears well-developed and well-nourished. No distress.  Pulmonary/Chest: Breath sounds normal. He has no wheezes.  Skin:  Right posterior thigh with urticarial plaques. Fading plaques under his right arm. Left wrist itchy but no rash noted.  Psychiatric:  Mildly anxious       Assessment:    1. Urticaria ;? Pruritis class effect of narcotics ? Anxiety mediated - hydrOXYzine (ATARAX/VISTARIL) 25 MG tablet; Take 1 tablet (25 mg total) by mouth every 6 (six) hours as needed.  Dispense: 28 tablet; Refill: 0    Plan:    Continue antihistamine and add Zantac. Stop tramadol.

## 2016-07-03 NOTE — Patient Instructions (Signed)
Stop tramadol. Add Zantac 75 twice daily and continue over the counter antihistamine like Claritin.

## 2016-07-06 ENCOUNTER — Telehealth: Payer: Self-pay | Admitting: Family Medicine

## 2016-07-06 NOTE — Telephone Encounter (Signed)
Pt states he seen Mikki Santee on Friday with hives on butt, back and arm.  Pt states he has stopped taking Tramadol as advised by Mikki Santee last week.  Pt is still having the break out of hives in the same places.  CB#[phone]/MW

## 2016-07-06 NOTE — Telephone Encounter (Signed)
Discussed adding Zantac and continuing loratadine and hydroxyzine

## 2016-07-16 ENCOUNTER — Telehealth: Payer: Self-pay

## 2016-07-16 ENCOUNTER — Other Ambulatory Visit: Payer: Self-pay | Admitting: Family Medicine

## 2016-07-16 DIAGNOSIS — L509 Urticaria, unspecified: Secondary | ICD-10-CM

## 2016-07-16 MED ORDER — PREDNISONE 20 MG PO TABS: ORAL_TABLET | ORAL | 0 refills | Status: DC

## 2016-07-16 NOTE — Telephone Encounter (Signed)
Pt saw Mikki Santee on 07/03/2016 for urticaria. Was told to D/C Tramadol, start Benadryl, hydroxyzine, Zantac. States the itching has not improved. Please advise. CB # [phone]. Renaldo Fiddler, CMA

## 2016-07-16 NOTE — Telephone Encounter (Signed)
Patient has been advised. KW 

## 2016-07-16 NOTE — Telephone Encounter (Signed)
Would you like me to schedule follow up appt since condition has not improved? KW

## 2016-07-16 NOTE — Telephone Encounter (Signed)
I have sent in a prednisone taper. Continue antihistamines.

## 2016-07-23 ENCOUNTER — Encounter: Payer: Self-pay | Admitting: Family Medicine

## 2016-07-23 ENCOUNTER — Ambulatory Visit (INDEPENDENT_AMBULATORY_CARE_PROVIDER_SITE_OTHER): Payer: BLUE CROSS/BLUE SHIELD | Admitting: Family Medicine

## 2016-07-23 VITALS — BP 150/100 | HR 113 | Temp 98.9°F | Resp 17 | Wt 206.8 lb

## 2016-07-23 DIAGNOSIS — L509 Urticaria, unspecified: Secondary | ICD-10-CM

## 2016-07-23 NOTE — Progress Notes (Signed)
Subjective:     Patient ID: Russell Simpson, male   DOB: 09-16-1961, 55 y.o.   MRN: [id number]  HPI  Chief Complaint  Patient presents with  . Urticaria    Patient returns to office for follow up visit, patient was last seen 07/03/16 and started on Hydroxyzine and advised to continue antihistamine and Zantac 75. On 07/16/16 after speaking with patient Prednisone was also added to list of medications. Patient reports that he has noticed slight improvement since starting prednisone, he states that his skin is mainly itchy in the AM upon awakening.    States his hives are not as bad but still recurring. Continues on loratadine and Benadryl and is on the last 4 days of a prednisone taper. No s.o.b. Has been on bp medication in the past but stopped it. No sinus symptoms.   Review of Systems     Objective:   Physical Exam  Constitutional: He appears well-developed and well-nourished. No distress.  Pulmonary/Chest: Breath sounds normal.  Skin:  Urticarial plaque in the right axilla.       Assessment:    1. Urticaria - Ambulatory referral to Dermatology    Plan:    Continue antihistamines, nurse bp check when off prednisone for a week.

## 2016-07-23 NOTE — Patient Instructions (Signed)
Continue loratadine and Benadryl. Complete prednisone. Come in for nurse bp check after you have been off prednisone for a week.

## 2016-08-05 ENCOUNTER — Encounter: Payer: Self-pay | Admitting: Family Medicine

## 2016-08-05 ENCOUNTER — Ambulatory Visit (INDEPENDENT_AMBULATORY_CARE_PROVIDER_SITE_OTHER): Payer: BLUE CROSS/BLUE SHIELD | Admitting: Family Medicine

## 2016-08-05 VITALS — BP 132/90 | HR 102 | Temp 97.6°F | Resp 16 | Wt 209.2 lb

## 2016-08-05 DIAGNOSIS — Z658 Other specified problems related to psychosocial circumstances: Secondary | ICD-10-CM

## 2016-08-05 DIAGNOSIS — F439 Reaction to severe stress, unspecified: Secondary | ICD-10-CM

## 2016-08-05 MED ORDER — CLONAZEPAM 0.5 MG PO TABS: 0.5000 mg | ORAL_TABLET | Freq: Two times a day (BID) | ORAL | 0 refills | Status: DC | PRN

## 2016-08-05 NOTE — Patient Instructions (Signed)
Call me in two weeks about how you are doing on medication.

## 2016-08-05 NOTE — Progress Notes (Signed)
Subjective:     Patient ID: Russell Simpson, male   DOB: 1961/03/12, 55 y.o.   MRN: [id number]  HPI  Chief Complaint  Patient presents with  . Anxiety    Patient comes in office today to address symptoms of anxiety, patient states that when he was seen last month for hives and believes that anxiety played a factor. Patient states that he took Clonazepam tablet that was hs girlfriends and his rash cleared within a day of taking medication. Patient reports that he has been under stress more so than usual.   . Hypertension    Patient returns back to office to address elevated blood pressure.   States he will cancel his dermatology appointment tomorrow. Reports stress triggers are his daughter's upcoming wedding and a job Secretary/administrator. States his neurosurgeon is clearing him to return to work.   Review of Systems     Objective:   Physical Exam  Constitutional: He appears well-developed and well-nourished. No distress.  Psychiatric: His behavior is normal.  Mildly anxious.       Assessment:    1. Situational stress - clonazePAM (KLONOPIN) 0.5 MG tablet; Take 1 tablet (0.5 mg total) by mouth 2 (two) times daily as needed for anxiety. Or Stress.  Dispense: 30 tablet; Refill: 0    Plan:    Phone f/u in two weeks. BP acceptable and suspect it will improve as anxiety is controlled. Consider nurse bp check in a few weeks.

## 2016-08-27 ENCOUNTER — Other Ambulatory Visit: Payer: Self-pay | Admitting: Family Medicine

## 2016-08-27 ENCOUNTER — Telehealth: Payer: Self-pay | Admitting: Family Medicine

## 2016-08-27 DIAGNOSIS — F439 Reaction to severe stress, unspecified: Secondary | ICD-10-CM

## 2016-08-27 MED ORDER — CLONAZEPAM 0.5 MG PO TABS: 0.5000 mg | ORAL_TABLET | Freq: Two times a day (BID) | ORAL | 5 refills | Status: DC | PRN

## 2016-08-27 NOTE — Telephone Encounter (Signed)
Pt contacted office for refill request on the following medications: clonazePAM (KLONOPIN) 0.5 MG tablet to [name] Last Written: 08/05/16 Last OV: 08/05/16  Pt stated it is helping and working very well. Pt stated that his daughter is getting married and doesn't want to run out when he really needs it. Please advise. Thanks TNP

## 2016-08-27 NOTE — Telephone Encounter (Signed)
Refill request. KW 

## 2016-08-27 NOTE — Progress Notes (Signed)
Called in Rx as below.  

## 2016-08-28 ENCOUNTER — Ambulatory Visit (INDEPENDENT_AMBULATORY_CARE_PROVIDER_SITE_OTHER): Payer: BLUE CROSS/BLUE SHIELD | Admitting: Family Medicine

## 2016-08-28 ENCOUNTER — Encounter: Payer: Self-pay | Admitting: Family Medicine

## 2016-08-28 VITALS — BP 130/80 | HR 108 | Temp 98.0°F | Resp 16 | Wt 207.0 lb

## 2016-08-28 DIAGNOSIS — H02843 Edema of right eye, unspecified eyelid: Secondary | ICD-10-CM | POA: Diagnosis not present

## 2016-08-28 MED ORDER — PREDNISONE 20 MG PO TABS: ORAL_TABLET | ORAL | 0 refills | Status: DC

## 2016-08-28 NOTE — Progress Notes (Signed)
Subjective:     Patient ID: Russell Simpson, male   DOB: Feb 24, 1961, 55 y.o.   MRN: [id number]  HPI  Chief Complaint  Patient presents with  . Eye Pain    right eye swollen and red.   States he woke up last night to go to the bathroom when he noticed his right lower eyelid was swollen. States he does sleep with two small dogs. He is participating in his daughter's wedding tomorrow. Currently being followed by dermatology for recurrent urticaria. He completed prednisone 1.5 weeks ago and uses TAC cream daily. Reports compliance with loratadine and Singulair. Took two Benadryl for his current sx.   Review of Systems     Objective:   Physical Exam  Constitutional: He appears well-developed and well-nourished. No distress.  Eyes: EOM are normal. Pupils are equal, round, and reactive to light.  Right eye with no scleral injection or drainage. Fluctuant swelling below his right eye not involving his lower eye lid. No sty formation or vesicles noted.       Assessment:    1. Swelling of eyelid, right - predniSONE (DELTASONE) 20 MG tablet; One pill twice daily for 5 days  Dispense: 10 tablet; Refill: 0    Plan:    Further f/u if not improving.

## 2016-09-30 ENCOUNTER — Other Ambulatory Visit: Payer: Self-pay | Admitting: Family Medicine

## 2016-09-30 DIAGNOSIS — F329 Major depressive disorder, single episode, unspecified: Secondary | ICD-10-CM

## 2016-09-30 DIAGNOSIS — F32A Depression, unspecified: Secondary | ICD-10-CM

## 2016-09-30 MED ORDER — DULOXETINE HCL 30 MG PO CPEP: ORAL_CAPSULE | ORAL | 3 refills | Status: DC

## 2016-10-01 ENCOUNTER — Ambulatory Visit (INDEPENDENT_AMBULATORY_CARE_PROVIDER_SITE_OTHER): Payer: BLUE CROSS/BLUE SHIELD | Admitting: Family Medicine

## 2016-10-01 ENCOUNTER — Encounter: Payer: Self-pay | Admitting: Family Medicine

## 2016-10-01 VITALS — BP 136/88 | HR 108 | Temp 98.2°F | Resp 20 | Wt 211.0 lb

## 2016-10-01 DIAGNOSIS — B9789 Other viral agents as the cause of diseases classified elsewhere: Secondary | ICD-10-CM

## 2016-10-01 DIAGNOSIS — J069 Acute upper respiratory infection, unspecified: Secondary | ICD-10-CM | POA: Diagnosis not present

## 2016-10-01 MED ORDER — HYDROCODONE-HOMATROPINE 5-1.5 MG/5ML PO SYRP: ORAL_SOLUTION | ORAL | 0 refills | Status: DC

## 2016-10-01 NOTE — Progress Notes (Signed)
Subjective:     Patient ID: Russell Simpson, male   DOB: September 26, 1961, 55 y.o.   MRN: [id number]  HPI  Chief Complaint  Patient presents with  . URI    x 3 days. Sore throat, productive cough with yellow/brown sputum. Nasal congestion with yellow discharge, sneezing, body aches. Denies facial pain, H/A, fever. Has tried Aleve, netti pot, with some relief.  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   Review of Systems     Objective:   Physical Exam  Constitutional: He appears well-developed and well-nourished. No distress.  Ears: T.M's intact without inflammation Sinuses: non-tender Throat: no tonsillar enlargement or exudate; posterior pharynx is moderately erythematous Neck: Tender left anterior cervical node. Lungs: clear     Assessment:    1. Viral upper respiratory tract infection - HYDROcodone-homatropine (HYCODAN) 5-1.5 MG/5ML syrup; 5 ml 4-6 hours as needed for cough  Dispense: 240 mL; Refill: 0    Plan:    Discussed use of Mucinex D and Delsym. Written rx for MVLB mixture 90 ml.to swish and spit. Work excuse for 11/2-11/3.

## 2016-10-01 NOTE — Patient Instructions (Signed)
Discussed use of Mucinex D and Delsym for cough. Rx for MVLB mixture for sore throat.

## 2016-10-05 ENCOUNTER — Other Ambulatory Visit: Payer: Self-pay | Admitting: Family Medicine

## 2016-10-05 ENCOUNTER — Telehealth: Payer: Self-pay | Admitting: Family Medicine

## 2016-10-05 DIAGNOSIS — J019 Acute sinusitis, unspecified: Secondary | ICD-10-CM

## 2016-10-05 MED ORDER — DOXYCYCLINE HYCLATE 100 MG PO TABS: 100.0000 mg | ORAL_TABLET | Freq: Two times a day (BID) | ORAL | 0 refills | Status: DC

## 2016-10-05 NOTE — Telephone Encounter (Signed)
Pt states he seen Russell Simpson last week for cough, sore throat and congestion.  Pt states he is still have a sore throat.  Pt states he throat feels like it is on fire.  [facility].  CB#[phone]/MW

## 2016-10-05 NOTE — Telephone Encounter (Signed)
[id number] 

## 2016-10-06 NOTE — Telephone Encounter (Signed)
Patient has spoken with provider in regards to acute problem. KW

## 2016-11-02 ENCOUNTER — Other Ambulatory Visit: Payer: Self-pay | Admitting: Family Medicine

## 2016-11-02 ENCOUNTER — Telehealth: Payer: Self-pay | Admitting: Family Medicine

## 2016-11-02 NOTE — Telephone Encounter (Signed)
Pt advised. Russell Simpson, CMA  

## 2016-11-02 NOTE — Telephone Encounter (Signed)
Pt stated that he is at his dental appt and he was advised they could fit him with a mouth piece to help decrease pt's snoring. Pt stated that he is supposed to wear a C PAP machine but because he feels it makes him sick he chooses not to use the machine. Pt stated he would like to try the mouth piece but Dr. Toy Cookey advised him she would need a referral for the mouth piece from our office.  Fax# [phone] Phone# to Dr. Corky Sing office [phone] pt stated we could ask for [facility] if there were any other questions. Please advise. Thanks TNP

## 2016-11-02 NOTE — Telephone Encounter (Signed)
rx completeted and will be faxed.

## 2017-01-15 ENCOUNTER — Telehealth: Payer: Self-pay | Admitting: Family Medicine

## 2017-01-15 NOTE — Telephone Encounter (Signed)
Dr. Toy Cookey made this patient an apparatus for his Cpap and he needs a titration done. Please contact Shelly at Dr. Betsey Holiday for times the dentist can be at the titration study.  [phone]

## 2017-01-19 ENCOUNTER — Other Ambulatory Visit: Payer: Self-pay | Admitting: Family Medicine

## 2017-01-19 DIAGNOSIS — G4733 Obstructive sleep apnea (adult) (pediatric): Secondary | ICD-10-CM

## 2017-01-19 NOTE — Telephone Encounter (Signed)
Discussed with Dr. Corky Sing office. Wishes to attend a titration study with appliance in place. Prefers a Thursday or Friday night. Referral in progress.

## 2017-02-01 ENCOUNTER — Encounter: Payer: Self-pay | Admitting: Family Medicine

## 2017-02-01 ENCOUNTER — Ambulatory Visit (INDEPENDENT_AMBULATORY_CARE_PROVIDER_SITE_OTHER): Payer: 59 | Admitting: Family Medicine

## 2017-02-01 VITALS — BP 132/90 | HR 83 | Temp 98.2°F | Resp 16 | Wt 212.4 lb

## 2017-02-01 DIAGNOSIS — G4733 Obstructive sleep apnea (adult) (pediatric): Secondary | ICD-10-CM

## 2017-02-01 NOTE — Patient Instructions (Signed)
Hope the sleep appliance works for you. We will call you with the results of the study when available.

## 2017-02-01 NOTE — Progress Notes (Signed)
Subjective:     Patient ID: Russell Simpson, male   DOB: [birth year], 56 y.o.   MRN: [id number]  HPI  Chief Complaint  Patient presents with  . Snoring    Patient comes in office today requesting a referral for a sleep study,patient states that he has had issues with snoring,  his dentist Dr. Toy Cookey had given patient a mouth guard to help patient through out the night  States he was diagnosed with obstructive sleep apnea after a sleep study 04/28/2013 ordered by his neurologist. He was placed on C-Pap @ 10 cm/H20. He blamed the device for giving him recurrent respiratory infections and discontinued  C-Pap 2 years ago. He sought help from his dentist due to persistent snoring. He has been using the sleep disorder appliance and reports his snoring has improved. His dentist wishes to observe a sleep titration study to see if OSA has improved as well.   Review of Systems     Objective:   Physical Exam  Constitutional: He appears well-developed and well-nourished. No distress.       Assessment:    1. Obstructive sleep apnea syndrome: have submitted referral for sleep titration study with Dr. Toy Cookey in attendance. Patient reports availability on Wednesday and Thursday.     Plan:    Sleep titration study with oral appliance and observation by his dentist, Dr. Toy Cookey.

## 2017-02-02 ENCOUNTER — Encounter: Payer: Self-pay | Admitting: Family Medicine

## 2017-02-25 ENCOUNTER — Other Ambulatory Visit: Payer: Self-pay | Admitting: Family Medicine

## 2017-02-25 ENCOUNTER — Telehealth: Payer: Self-pay

## 2017-02-25 MED ORDER — PROAIR HFA 108 (90 BASE) MCG/ACT IN AERS: 1.0000 | INHALATION_SPRAY | Freq: Four times a day (QID) | RESPIRATORY_TRACT | 2 refills | Status: DC | PRN

## 2017-02-25 NOTE — Telephone Encounter (Signed)
Patient is requesting a refill on PROAIR HFA 108 (90 Base) MCG/ACT inhaler be sent to [facility]. CB#[phone]

## 2017-02-25 NOTE — Telephone Encounter (Signed)
Refilled Proair

## 2017-04-01 ENCOUNTER — Ambulatory Visit: Payer: 59 | Attending: Otolaryngology

## 2017-04-01 DIAGNOSIS — G4733 Obstructive sleep apnea (adult) (pediatric): Secondary | ICD-10-CM | POA: Insufficient documentation

## 2017-04-07 ENCOUNTER — Telehealth: Payer: Self-pay | Admitting: Family Medicine

## 2017-04-07 NOTE — Telephone Encounter (Signed)
error 

## 2017-04-09 ENCOUNTER — Other Ambulatory Visit: Payer: Self-pay | Admitting: Family Medicine

## 2017-04-09 DIAGNOSIS — F439 Reaction to severe stress, unspecified: Secondary | ICD-10-CM

## 2017-04-09 MED ORDER — CLONAZEPAM 0.5 MG PO TABS: 0.5000 mg | ORAL_TABLET | Freq: Two times a day (BID) | ORAL | 0 refills | Status: DC | PRN

## 2017-04-09 NOTE — Telephone Encounter (Signed)
[facility] faxed a request for the following medication. Thanks CC   clonazePAM (KLONOPIN) 0.5 MG tablet

## 2017-04-12 ENCOUNTER — Telehealth: Payer: Self-pay | Admitting: Family Medicine

## 2017-04-12 NOTE — Telephone Encounter (Signed)
Russell Simpson faxed a request for the following medication. Thanks CC   clonazePAM (KLONOPIN) 0.5 MG tablet

## 2017-04-12 NOTE — Telephone Encounter (Signed)
This was already done-aa 

## 2017-04-12 NOTE — Telephone Encounter (Signed)
Spoke with pharmacist who states that had no record of medication from 04/09/17, gave verbal order on phone and left message for patient notifying them of prescription. KW

## 2017-04-12 NOTE — Telephone Encounter (Signed)
Pt states [facility] has not rec'd his Rx for clonazePAM (KLONOPIN) 0.5 MG tablet.  CB#[phone]/MW

## 2017-04-16 ENCOUNTER — Encounter: Payer: Self-pay | Admitting: Family Medicine

## 2017-04-23 ENCOUNTER — Ambulatory Visit: Payer: 59 | Admitting: Family Medicine

## 2017-04-23 ENCOUNTER — Ambulatory Visit: Payer: Self-pay | Admitting: Family Medicine

## 2017-04-28 ENCOUNTER — Encounter: Payer: Self-pay | Admitting: Family Medicine

## 2017-04-28 ENCOUNTER — Ambulatory Visit (INDEPENDENT_AMBULATORY_CARE_PROVIDER_SITE_OTHER): Payer: 59 | Admitting: Family Medicine

## 2017-04-28 ENCOUNTER — Telehealth: Payer: Self-pay | Admitting: Family Medicine

## 2017-04-28 VITALS — BP 124/80 | HR 87 | Temp 97.9°F | Resp 16 | Wt 210.0 lb

## 2017-04-28 DIAGNOSIS — G4733 Obstructive sleep apnea (adult) (pediatric): Secondary | ICD-10-CM

## 2017-04-28 DIAGNOSIS — S61402S Unspecified open wound of left hand, sequela: Secondary | ICD-10-CM | POA: Diagnosis not present

## 2017-04-28 MED ORDER — SODIUM CHLORIDE 0.9 % EX SOLN: 2.0000 "application " | Freq: Two times a day (BID) | CUTANEOUS | 0 refills | Status: DC

## 2017-04-28 NOTE — Telephone Encounter (Signed)
ROI faxed to [facility]

## 2017-04-28 NOTE — Progress Notes (Signed)
Subjective:     Patient ID: Russell Simpson, male   DOB: 10/24/1961, 56 y.o.   MRN: [id number]  HPI  Chief Complaint  Patient presents with  . Sleep Apnea    Patient returns to office today for follow up visit, last visit was 02/01/17 patient was referred fpor sleep titration with Dr Toy Cookey. Patient reports that he had sleep study which showed he had sleep apnea, patient would like to discuss CPap machine today.  . Wound Check    Patient reports that 3 weeks ago he had a laceration to his left hand after accidently cutting himself with knife. Patient states that he went to [facility] where site was cleaned and sutured, patient states that a week after sutures opened while he was at work and patient went to [employer] [facility]. Patient states that he went to walk in clinic on two occasions because sutures opened back up and last time was told that wound would have to heal itself, patient complains of pain and drainage.  Discussed sending in form for sleep titration. He is aware that there are smaller CPAP masks he may tolerate better. 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 Has been applying witch hazel to the wound and abx ointment.   Review of Systems     Objective:   Physical Exam  Constitutional: He appears well-developed and well-nourished. No distress.  Skin:  Left thenar area with healing laceration. No drainage or erythema with granulation tissue present.       Assessment:    1. Obstructive sleep apnea syndrome: order sleep titration study   2. Open wound of left hand without foreign body, unspecified wound type, sequela - SODIUM CHLORIDE, EXTERNAL, ([facility] SALINE WOUND [place]) 0.9 % SOLN; Apply 2 application topically 2 (two) times daily.  Dispense: 210 mL; Refill: 0    Plan:    Discussed use of saline irrigation and not using other topicals. Work excuse for 5/29-04/30/17. Further f/u pending sleep study.

## 2017-04-28 NOTE — Patient Instructions (Signed)
We will call you about the sleep titration study. Cleanse your wound twice daily with saline and apply dressing with Coban bandage. Don't place any other topical medication on the wound.

## 2017-05-19 ENCOUNTER — Ambulatory Visit: Payer: 59 | Attending: Neurology

## 2017-05-19 DIAGNOSIS — G4733 Obstructive sleep apnea (adult) (pediatric): Secondary | ICD-10-CM | POA: Diagnosis present

## 2017-05-19 DIAGNOSIS — G4761 Periodic limb movement disorder: Secondary | ICD-10-CM | POA: Diagnosis not present

## 2017-05-26 ENCOUNTER — Encounter: Payer: Self-pay | Admitting: Family Medicine

## 2017-06-10 ENCOUNTER — Telehealth: Payer: Self-pay | Admitting: Family Medicine

## 2017-06-10 ENCOUNTER — Other Ambulatory Visit: Payer: Self-pay | Admitting: Family Medicine

## 2017-06-10 DIAGNOSIS — F439 Reaction to severe stress, unspecified: Secondary | ICD-10-CM

## 2017-06-10 MED ORDER — CLONAZEPAM 0.5 MG PO TABS: 0.5000 mg | ORAL_TABLET | Freq: Two times a day (BID) | ORAL | 0 refills | Status: DC | PRN

## 2017-06-10 NOTE — Telephone Encounter (Signed)
[facility] faxed a request on the following medication. Thanks CC  clonazePAM (KLONOPIN) 0.5 MG tablet

## 2017-06-10 NOTE — Telephone Encounter (Signed)
Refilled  # 60,

## 2017-06-10 NOTE — Telephone Encounter (Signed)
Last filled 04/09/17.KW

## 2017-06-10 NOTE — Progress Notes (Signed)
Prescription for Clonazepam has been called into [name]. KW

## 2017-07-21 ENCOUNTER — Encounter: Payer: Self-pay | Admitting: Family Medicine

## 2017-07-21 ENCOUNTER — Ambulatory Visit (INDEPENDENT_AMBULATORY_CARE_PROVIDER_SITE_OTHER): Payer: 59 | Admitting: Family Medicine

## 2017-07-21 VITALS — BP 138/90 | HR 87 | Temp 98.3°F | Wt 216.0 lb

## 2017-07-21 DIAGNOSIS — M19029 Primary osteoarthritis, unspecified elbow: Secondary | ICD-10-CM | POA: Insufficient documentation

## 2017-07-21 DIAGNOSIS — G4733 Obstructive sleep apnea (adult) (pediatric): Secondary | ICD-10-CM | POA: Diagnosis not present

## 2017-07-21 DIAGNOSIS — M719 Bursopathy, unspecified: Secondary | ICD-10-CM | POA: Insufficient documentation

## 2017-07-21 DIAGNOSIS — M1991 Primary osteoarthritis, unspecified site: Secondary | ICD-10-CM | POA: Insufficient documentation

## 2017-07-21 DIAGNOSIS — M179 Osteoarthritis of knee, unspecified: Secondary | ICD-10-CM | POA: Insufficient documentation

## 2017-07-21 DIAGNOSIS — M1710 Unilateral primary osteoarthritis, unspecified knee: Secondary | ICD-10-CM | POA: Insufficient documentation

## 2017-07-21 DIAGNOSIS — M75120 Complete rotator cuff tear or rupture of unspecified shoulder, not specified as traumatic: Secondary | ICD-10-CM | POA: Insufficient documentation

## 2017-07-21 DIAGNOSIS — M7550 Bursitis of unspecified shoulder: Secondary | ICD-10-CM

## 2017-07-21 NOTE — Patient Instructions (Signed)
Continue C-Pap at 6 cm.along with the oral appliance.

## 2017-07-21 NOTE — Progress Notes (Signed)
Subjective:     Patient ID: Russell Simpson, male   DOB: 04/08/1961, 56 y.o.   MRN: [id number]  HPI  Chief Complaint  Patient presents with  . Consultation    Patient is here for a consultation regarding C-Pap machine usage. Patient states he is doing well using the machine, but is not able to use machine all night due to work schedule. He reports sleep apnea symptoms have improved since using C-Pap  States he feels refreshed in the AM. Continues to use oral appliance and C-Pap with pillows @ 6 cm/H2O. Repeat Epworth screen is 3 today.   Review of Systems     Objective:   Physical Exam  Constitutional: He appears well-developed and well-nourished. No distress.       Assessment:    1. Obstructive sleep apnea syndrome: stable     Plan:    Continue C-pap and oral appliance. Copy of the update to [facility].

## 2017-07-30 ENCOUNTER — Telehealth: Payer: Self-pay | Admitting: Family Medicine

## 2017-07-30 DIAGNOSIS — F439 Reaction to severe stress, unspecified: Secondary | ICD-10-CM

## 2017-07-30 NOTE — Telephone Encounter (Signed)
[facility] faxed refill request for the following medications: 1. montelukast (SINGULAIR) 10 MG tablet   Last Rx:  11/03/16 2. clonazePAM (KLONOPIN) 0.5 MG tablet Last Rx: 06/10/17 LOV: 07/21/17 Please advise. Thanks TNP

## 2017-08-03 ENCOUNTER — Other Ambulatory Visit: Payer: Self-pay | Admitting: Family Medicine

## 2017-08-03 DIAGNOSIS — F439 Reaction to severe stress, unspecified: Secondary | ICD-10-CM

## 2017-08-03 MED ORDER — MONTELUKAST SODIUM 10 MG PO TABS: 10.0000 mg | ORAL_TABLET | Freq: Every day | ORAL | 5 refills | Status: DC

## 2017-08-03 MED ORDER — CLONAZEPAM 0.5 MG PO TABS: 0.5000 mg | ORAL_TABLET | Freq: Two times a day (BID) | ORAL | 0 refills | Status: DC | PRN

## 2017-08-03 NOTE — Progress Notes (Signed)
RX called in.   Thanks,   -Laura  

## 2017-08-03 NOTE — Telephone Encounter (Signed)
done

## 2017-08-23 ENCOUNTER — Telehealth: Payer: Self-pay | Admitting: Family Medicine

## 2017-08-23 ENCOUNTER — Other Ambulatory Visit: Payer: Self-pay | Admitting: Family Medicine

## 2017-08-23 DIAGNOSIS — F439 Reaction to severe stress, unspecified: Secondary | ICD-10-CM

## 2017-08-23 MED ORDER — CLONAZEPAM 0.5 MG PO TABS: 0.5000 mg | ORAL_TABLET | Freq: Two times a day (BID) | ORAL | 2 refills | Status: DC | PRN

## 2017-08-23 NOTE — Telephone Encounter (Signed)
done

## 2017-08-23 NOTE — Progress Notes (Signed)
presciption has been called into pharmacy. KW

## 2017-08-23 NOTE — Telephone Encounter (Signed)
Last filled 08/03/17, please review. KW

## 2017-08-23 NOTE — Telephone Encounter (Signed)
[facility] faxed a refill request on the following medications:  clonazePAM (KLONOPIN) 0.5 MG tablet   [facility]/MW

## 2017-09-01 ENCOUNTER — Ambulatory Visit (INDEPENDENT_AMBULATORY_CARE_PROVIDER_SITE_OTHER): Payer: 59 | Admitting: Family Medicine

## 2017-09-01 ENCOUNTER — Encounter: Payer: Self-pay | Admitting: Family Medicine

## 2017-09-01 VITALS — BP 138/94 | HR 106 | Temp 98.4°F | Resp 18 | Wt 212.6 lb

## 2017-09-01 DIAGNOSIS — F419 Anxiety disorder, unspecified: Secondary | ICD-10-CM | POA: Diagnosis not present

## 2017-09-01 DIAGNOSIS — F329 Major depressive disorder, single episode, unspecified: Secondary | ICD-10-CM

## 2017-09-01 DIAGNOSIS — F32A Depression, unspecified: Secondary | ICD-10-CM

## 2017-09-01 DIAGNOSIS — F331 Major depressive disorder, recurrent, moderate: Secondary | ICD-10-CM

## 2017-09-01 DIAGNOSIS — F1010 Alcohol abuse, uncomplicated: Secondary | ICD-10-CM | POA: Diagnosis not present

## 2017-09-01 NOTE — Progress Notes (Signed)
Russell Simpson  MRN: [id number] DOB: [birth year]  Subjective:  HPI  Patient is here to discuss depression. Patient usually sees [facility].  Patient states he has been on Cymbalta and Clonazepam for a long time and symptoms have been well controlled until the past few days started to feel worse. He takes Cymbalta 30 mg 1 tablet twice daily and takes Clonazepam 0.5 mg 1 tablet twice daily. He use to take Trazodone also but stopped about 6 months ago due to sleeping too deep with that medication and he ended up missing a call for work. He is working a lot and feels that maybe that is contributing to him feeling bad. 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 Drinks at least 12 pack a night-beer. Depression screen PHQ 2/9 09/01/2017  Decreased Interest 2  Down, Depressed, Hopeless 2  PHQ - 2 Score 4  Altered sleeping 3  Tired, decreased energy 3  Change in appetite 3  Feeling bad or failure about yourself  3  Trouble concentrating 2  Moving slowly or fidgety/restless 1  Suicidal thoughts 2  PHQ-9 Score 21  Difficult doing work/chores Very difficult   Patient Active Problem List   Diagnosis Date Noted  . Disorder of bursae of shoulder region 07/21/2017  . Full thickness rotator cuff tear 07/21/2017  . Localized, primary osteoarthritis 07/21/2017  . 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 Osteoarthritis of knee 07/21/2017  . Anxiety 01/10/2016  . BP (high blood pressure) 01/10/2016  . 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 Apnea, sleep 01/10/2016  . Hyperlipidemia 10/15/2015  . Pituitary adenoma ([place]) 10/15/2015  . 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 Depression 10/15/2015  . 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 BPH without obstruction/lower urinary tract symptoms 10/15/2015  . Allergic rhinitis 10/15/2015  . Incomplete bladder emptying 05/30/2015  . Hernia, inguinal, left 09/21/2014  . Benign prostatic hyperplasia with urinary obstruction  10/24/2012  . 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 OSTEOMYELITIS, ACUTE, OTHER [facility] SITE 06/09/2006    Past Medical History:  Diagnosis Date  . 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[employer])   . Arthritis   . Asthma   . 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 Chronic pain    right arm  . Depression   . Environmental and seasonal allergies   . Pneumonia   . Sleep apnea    does not wear CPAP ; uses humidifier instead    Social History   Social History  . Marital status: Married    Spouse name: N/A  . Number of children: N/A  . Years of education: N/A   Occupational History  . Not on file.   Social History Main Topics  . Smoking status: Light Tobacco Smoker    Types: E-cigarettes, Cigars  . Smokeless tobacco: Never Used     Comment: " sometimes during the day and at night"  . Alcohol use 0.0 oz/week     Comment: occasional beer  . Drug use: No  . Sexual activity: Not on file   Other Topics Concern  . Not on file   Social History Narrative  . No narrative on file    Outpatient Encounter Prescriptions as of 09/01/2017  Medication Sig  . Arginine 2000 MG PACK Take by mouth daily.  . cabergoline (DOSTINEX) 0.5 MG tablet Take 0.25 mg by mouth 2 (two) times a week.  Carin Hock Iron (PERFECT IRON) 25 MG TABS Take  by mouth daily.  . clonazePAM (KLONOPIN) 0.5 MG tablet Take 1 tablet (0.5 mg total) by mouth 2 (two) times daily as needed for anxiety. Or Stress.  . Coenzyme Q10 (COQ-10) 100 MG CAPS Take by mouth daily.  . Cyanocobalamin (B-12) 5000 MCG SUBL Place under the tongue daily.  . DULoxetine (CYMBALTA) 30 MG capsule Take 3 capsules once daily  . finasteride (PROSCAR) 5 MG tablet Take 5 mg by mouth daily.  . Ginger, Zingiber officinalis, (GINGER PO) Take by mouth.  . Glucosamine 500 MG CAPS Take by mouth daily.  [name] loratadine (CLARITIN) 10 MG tablet Take 10 mg by mouth daily.   . magnesium oxide (MAG-OX) 400 MG tablet Take 400 mg by mouth daily.    . montelukast (SINGULAIR) 10 MG tablet Take 1 tablet (10 mg total) by mouth at bedtime.  . naproxen sodium (ANAPROX) 220 MG tablet Take 220 mg by mouth 2 (two) times daily with a meal.  . Omega-3 Fatty Acids (FISH OIL) 1000 MG CAPS Take by mouth.  [name] OVER THE COUNTER MEDICATION OREGA MAX  . OVER THE COUNTER MEDICATION RED MARINE ALGAE-375 MG.  Abe People HFA 108 (90 Base) MCG/ACT inhaler Inhale 1 puff into the lungs every 6 (six) hours as needed for wheezing.  . sildenafil (REVATIO) 20 MG tablet 3-5 tablets daily as needed for erectile dysfunction  . SODIUM CHLORIDE, EXTERNAL, ([employer] SALINE WOUND [place]) 0.9 % SOLN Apply 2 application topically 2 (two) times daily.  . tamsulosin (FLOMAX) 0.4 MG CAPS capsule Take 0.4 mg by mouth daily.   [name] testosterone cypionate (DEPOTESTOTERONE CYPIONATE) 100 MG/ML injection Inject 100 mg into the muscle once a week.   . [DISCONTINUED] bromocriptine (PARLODEL) 2.5 MG tablet Take 2.5 mg by mouth daily.   . [DISCONTINUED] fluticasone (FLONASE) 50 MCG/ACT nasal spray Place 1 spray into the nose daily.   . [DISCONTINUED] Multiple Vitamin (MULTIVITAMIN) capsule Take 1 capsule by mouth daily.  . [DISCONTINUED] traZODone (DESYREL) 100 MG tablet Two at bedtime (Patient taking differently: Take 200 mg by mouth at bedtime. )   No facility-administered encounter medications on file as of 09/01/2017.     Allergies  Allergen Reactions  . Penicillins Anaphylaxis  . Tetanus Toxoids Swelling  . Lisinopril     Other reaction(s): Other (See Comments) Other reaction(s): Cough Other reaction(s): Cough Other reaction(s): Cough Other reaction(s): Cough  . Losartan     Other reaction(s): Other (See Comments) Other reaction(s): Muscle Pain Other reaction(s): Muscle Pain Other reaction(s): Muscle Pain  . Tetanus Toxoid   . Codeine Nausea Only    Review of Systems  Constitutional: Positive for malaise/fatigue.  HENT: Negative.   Eyes: Negative.   Respiratory: Negative.    Cardiovascular: Negative.   Gastrointestinal: Negative.   Skin: Negative.   Endo/Heme/Allergies: Negative.   Psychiatric/Behavioral: Positive for depression. The patient is nervous/anxious and has insomnia.        Trouble concentrating. Panic attacks    Objective:  BP (!) 138/94   Pulse (!) 106   Temp 98.4 F (36.9 C)   Resp 18   Wt 212 lb 9.6 oz (96.4 kg)   BMI 27.30 kg/m   Physical Exam  Constitutional: He is oriented to person, place, and time and well-developed, well-nourished, and in no distress.  HENT:  Head: Normocephalic and atraumatic.  Right Ear: External ear normal.  Left Ear: External ear normal.  Nose: Nose normal.  Eyes: Conjunctivae are normal. No scleral icterus.  Neck: No thyromegaly present.  Cardiovascular:  Normal rate, regular rhythm and normal heart sounds.   Pulmonary/Chest: Effort normal and breath sounds normal.  Abdominal: Soft.  Neurological: He is alert and oriented to person, place, and time.  Skin: Skin is warm and dry.  Psychiatric: Mood, memory, affect and judgment normal.    Assessment and Plan :  1. Moderate episode of recurrent major depressive disorder (HCC) Worse. Patient advised to seek counseling through work if they have it offered to employees. Advised patient to increase Cymbalta to 30 mg 3 tablets daily. Cut back on alcohol. Also provided number for counseling with Eugenia Pancoast. 2. Anxiety See plan above. 3. Alcohol abuse Patient drinks 12-18 pack of beer every night at least the past 6 months. Discussed this in detail and cutting back. Patient does not want to see AA to help him. Wants to try at home. I do not think cold [place] will work for the patient and may have alcohol withdrawal with this. Patient advised to try and drink 6 drinks a day to start with and then continue to decrease from there.  HPI, Exam and A&P transcribed by Tiffany Kocher, RMA under direction and in the presence of Miguel Aschoff, MD. I have done the  exam and reviewed the chart and it is accurate to the best of my knowledge. Development worker, community has been used and  any errors in dictation or transcription are unintentional. Miguel Aschoff M.D. [facility]

## 2017-09-01 NOTE — Patient Instructions (Addendum)
Check with work if they have Education administrator counseling.  If your work does not have this program you can call Russell Simpson at [phone].

## 2017-10-07 ENCOUNTER — Encounter: Payer: Self-pay | Admitting: Family Medicine

## 2017-10-07 ENCOUNTER — Ambulatory Visit: Payer: 59 | Admitting: Family Medicine

## 2017-10-07 VITALS — BP 138/82 | HR 92 | Temp 97.6°F | Resp 16 | Wt 217.0 lb

## 2017-10-07 DIAGNOSIS — E785 Hyperlipidemia, unspecified: Secondary | ICD-10-CM | POA: Diagnosis not present

## 2017-10-07 DIAGNOSIS — E291 Testicular hypofunction: Secondary | ICD-10-CM

## 2017-10-07 DIAGNOSIS — D352 Benign neoplasm of pituitary gland: Secondary | ICD-10-CM

## 2017-10-07 DIAGNOSIS — I10 Essential (primary) hypertension: Secondary | ICD-10-CM | POA: Diagnosis not present

## 2017-10-07 NOTE — Progress Notes (Signed)
Patient: Russell Simpson Male    DOB: 1961/01/14   56 y.o.   MRN: [id number] Visit Date: 10/07/2017  Today's Provider: Wilhemena Durie, MD   Chief Complaint  Patient presents with  . Depression   Subjective:    HPI Pt is here for 1 month follow up of depression his Cymbalta was increased to 30 mg 3 daily. He reports that he is feeling better emotionally but he is having the sexually side effect. He reports that he is only drinking about 3 beers a day now. He is drinking non alcoholic beer in between his regular beers to reduce the number of beers he drinks daily.  PHQ 9 score is 3 today.       Allergies  Allergen Reactions  . Penicillins Anaphylaxis  . Tetanus Toxoids Swelling  . Lisinopril     Other reaction(s): Other (See Comments) Other reaction(s): Cough Other reaction(s): Cough Other reaction(s): Cough Other reaction(s): Cough  . Losartan     Other reaction(s): Other (See Comments) Other reaction(s): Muscle Pain Other reaction(s): Muscle Pain Other reaction(s): Muscle Pain  . Tetanus Toxoid   . Codeine Nausea Only     Current Outpatient Medications:  .  Arginine 2000 MG PACK, Take by mouth daily., Disp: , Rfl:  .  cabergoline (DOSTINEX) 0.5 MG tablet, Take 0.25 mg by mouth 2 (two) times a week., Disp: , Rfl:  .  Carbonyl Iron (PERFECT IRON) 25 MG TABS, Take by mouth daily., Disp: , Rfl:  .  clonazePAM (KLONOPIN) 0.5 MG tablet, Take 1 tablet (0.5 mg total) by mouth 2 (two) times daily as needed for anxiety. Or Stress., Disp: 60 tablet, Rfl: 2 .  Coenzyme Q10 (COQ-10) 100 MG CAPS, Take by mouth daily., Disp: , Rfl:  .  Cyanocobalamin (B-12) 5000 MCG SUBL, Place under the tongue daily., Disp: , Rfl:  .  DULoxetine (CYMBALTA) 30 MG capsule, Take 3 capsules once daily, Disp: 270 capsule, Rfl: 3 .  finasteride (PROSCAR) 5 MG tablet, Take 5 mg by mouth daily., Disp: , Rfl: 2 .  Ginger, Zingiber officinalis, (GINGER PO), Take by mouth., Disp: , Rfl:  .   Glucosamine 500 MG CAPS, Take by mouth daily., Disp: , Rfl:  .  loratadine (CLARITIN) 10 MG tablet, Take 10 mg by mouth daily. , Disp: , Rfl:  .  magnesium oxide (MAG-OX) 400 MG tablet, Take 400 mg by mouth daily., Disp: , Rfl:  .  montelukast (SINGULAIR) 10 MG tablet, Take 1 tablet (10 mg total) by mouth at bedtime., Disp: 30 tablet, Rfl: 5 .  naproxen sodium (ANAPROX) 220 MG tablet, Take 220 mg by mouth 2 (two) times daily with a meal., Disp: , Rfl:  .  Omega-3 Fatty Acids (FISH OIL) 1000 MG CAPS, Take by mouth., Disp: , Rfl:  .  OVER THE COUNTER MEDICATION, OREGA MAX, Disp: , Rfl:  .  OVER THE COUNTER MEDICATION, RED MARINE ALGAE-375 MG., Disp: , Rfl:  .  PROAIR HFA 108 (90 Base) MCG/ACT inhaler, Inhale 1 puff into the lungs every 6 (six) hours as needed for wheezing., Disp: 18 g, Rfl: 2 .  sildenafil (REVATIO) 20 MG tablet, 3-5 tablets daily as needed for erectile dysfunction, Disp: , Rfl:  .  SODIUM CHLORIDE, EXTERNAL, ([facility]S SALINE WOUND [place]) 0.9 % SOLN, Apply 2 application topically 2 (two) times daily., Disp: 210 mL, Rfl: 0 .  tamsulosin (FLOMAX) 0.4 MG CAPS capsule, Take 0.4 mg by mouth daily. ,  Disp: , Rfl:  .  testosterone cypionate (DEPOTESTOTERONE CYPIONATE) 100 MG/ML injection, Inject 100 mg into the muscle once a week. , Disp: , Rfl:   Review of Systems  Constitutional: Negative.   HENT: Negative.   Eyes: Negative.   Respiratory: Negative.   Cardiovascular: Negative.   Gastrointestinal: Negative.   Endocrine: Negative.   Genitourinary: Negative.   Musculoskeletal: Negative.   Skin: Negative.   Allergic/Immunologic: Negative.   Neurological: Negative.   Hematological: Negative.   Psychiatric/Behavioral: Negative.     Social History   Tobacco Use  . Smoking status: Light Tobacco Smoker    Types: E-cigarettes, Cigars  . Smokeless tobacco: Never Used  . Tobacco comment: " sometimes during the day and at night"  Substance Use Topics  . Alcohol use: Yes     Alcohol/week: 0.0 oz    Comment: 12 pack a beer a night   Objective:   BP 138/82 (BP Location: Right Arm, Patient Position: Sitting, Cuff Size: Normal)   Pulse 92   Temp 97.6 F (36.4 C) (Oral)   Resp 16   Wt 217 lb (98.4 kg)   BMI 27.86 kg/m  Vitals:   10/07/17 1633  BP: 138/82  Pulse: 92  Resp: 16  Temp: 97.6 F (36.4 C)  TempSrc: Oral  Weight: 217 lb (98.4 kg)     Physical Exam  Constitutional: He is oriented to person, place, and time. He appears well-developed and well-nourished.  HENT:  Head: Normocephalic and atraumatic.  Eyes: Conjunctivae are normal. No scleral icterus.  Neck: No thyromegaly present.  Cardiovascular: Normal rate, regular rhythm and normal heart sounds.  Pulmonary/Chest: Effort normal and breath sounds normal.  Abdominal: Soft.  Neurological: He is alert and oriented to person, place, and time.  Skin: Skin is warm and dry.  Psychiatric: He has a normal mood and affect. His behavior is normal. Judgment and thought content normal.        Assessment & Plan:     1. Essential hypertension   2. Testicular hypofunction Per urology.  3. 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 Hyperlipidemia, unspecified hyperlipidemia type  - Lipid panel 5.Alcoholism Consuming less--encouraged to discontinue. 6.Depression Much Improved.RTC 2-3 months.  I have done the exam and reviewed the chart and it is accurate to the best of my knowledge. Development worker, community has been used and  any errors in dictation or transcription are unintentional. Miguel Aschoff M.D. [place], MD  [facility]

## 2017-10-12 ENCOUNTER — Other Ambulatory Visit: Payer: Self-pay | Admitting: Family Medicine

## 2017-10-12 DIAGNOSIS — F32A Depression, unspecified: Secondary | ICD-10-CM

## 2017-10-12 DIAGNOSIS — F329 Major depressive disorder, single episode, unspecified: Secondary | ICD-10-CM

## 2017-10-12 MED ORDER — DULOXETINE HCL 30 MG PO CPEP: ORAL_CAPSULE | ORAL | 1 refills | Status: DC

## 2017-10-12 NOTE — Telephone Encounter (Signed)
[facility] faxed a request for the following medication. Thanks CC  DULoxetine (CYMBALTA) 30 MG capsule

## 2017-10-15 ENCOUNTER — Other Ambulatory Visit: Payer: Self-pay

## 2017-10-15 ENCOUNTER — Telehealth: Payer: Self-pay | Admitting: Family Medicine

## 2017-10-15 NOTE — Telephone Encounter (Signed)
Have not seen anything from pharmacy yet.  I went ahead and submitted for PA on cover my meds now, waiting for Elgie Collard, RMA

## 2017-10-15 NOTE — Telephone Encounter (Signed)
Pt stated that [facility] faxed our office a PA for DULoxetine (CYMBALTA) 30 MG capsule. Pt is requesting a call back to get a status update on the PA b/c the pharmacy told pt we hadn't done our part. Please advise. Thanks TNP

## 2017-10-16 LAB — COMPLETE METABOLIC PANEL WITH GFR
AG Ratio: 2 (calc) (ref 1.0–2.5)
ALT: 28 U/L (ref 9–46)
AST: 30 U/L (ref 10–35)
Albumin: 4.7 g/dL (ref 3.6–5.1)
Alkaline phosphatase (APISO): 80 U/L (ref 40–115)
BUN: 15 mg/dL (ref 7–25)
CALCIUM: 9.6 mg/dL (ref 8.6–10.3)
CO2: 26 mmol/L (ref 20–32)
CREATININE: 1.14 mg/dL (ref 0.70–1.33)
Chloride: 105 mmol/L (ref 98–110)
GFR, EST AFRICAN AMERICAN: 83 mL/min/{1.73_m2} (ref >=60)
GFR, EST NON AFRICAN AMERICAN: 72 mL/min/{1.73_m2} (ref >=60)
GLUCOSE: 107 mg/dL — AB (ref 65–99)
Globulin: 2.4 g/dL (calc) (ref 1.9–3.7)
Potassium: 4.7 mmol/L (ref 3.5–5.3)
Sodium: 141 mmol/L (ref 135–146)
TOTAL PROTEIN: 7.1 g/dL (ref 6.1–8.1)
Total Bilirubin: 0.5 mg/dL (ref 0.2–1.2)

## 2017-10-16 LAB — CBC WITH DIFFERENTIAL/PLATELET
Basophils Absolute: 52 cells/uL (ref 0–200)
Basophils Relative: 0.8 %
Eosinophils Absolute: 163 cells/uL (ref 15–500)
Eosinophils Relative: 2.5 %
HCT: 53.3 % — ABNORMAL HIGH (ref 38.5–50.0)
Hemoglobin: 18.9 g/dL — ABNORMAL HIGH (ref 13.2–17.1)
Lymphs Abs: 2763 cells/uL (ref 850–3900)
MCH: 33.2 pg — AB (ref 27.0–33.0)
MCHC: 35.5 g/dL (ref 32.0–36.0)
MCV: 93.7 fL (ref 80.0–100.0)
MONOS PCT: 10 %
MPV: 9.2 fL (ref 7.5–12.5)
NEUTROS PCT: 44.2 %
Neutro Abs: 2873 cells/uL (ref 1500–7800)
PLATELETS: 259 10*3/uL (ref 140–400)
RBC: 5.69 10*6/uL (ref 4.20–5.80)
RDW: 11.9 % (ref 11.0–15.0)
TOTAL LYMPHOCYTE: 42.5 %
WBC: 6.5 10*3/uL (ref 3.8–10.8)
WBCMIX: 650 {cells}/uL (ref 200–950)

## 2017-10-16 LAB — LIPID PANEL
Cholesterol: 203 mg/dL — ABNORMAL HIGH (ref <200)
HDL: 79 mg/dL (ref >40)
LDL Cholesterol (Calc): 108 mg/dL (calc) — ABNORMAL HIGH
NON-HDL CHOLESTEROL (CALC): 124 mg/dL (ref <130)
Total CHOL/HDL Ratio: 2.6 (calc) (ref <5.0)
Triglycerides: 70 mg/dL (ref <150)

## 2017-10-16 LAB — TSH: TSH: 3.39 mIU/L (ref 0.40–4.50)

## 2017-10-16 LAB — PROLACTIN: Prolactin: <1 ng/mL — ABNORMAL LOW (ref 2.0–18.0)

## 2017-10-19 ENCOUNTER — Other Ambulatory Visit: Payer: Self-pay | Admitting: Emergency Medicine

## 2017-10-19 DIAGNOSIS — D751 Secondary polycythemia: Secondary | ICD-10-CM

## 2017-10-19 NOTE — Telephone Encounter (Signed)
Pt called about getting his DULoxetine (CYMBALTA) 30 MG capsule and  was advised as below. Thanks TNP

## 2017-10-20 NOTE — Telephone Encounter (Signed)
Spoke with insurance representative today. PA apporved for quantity limit for 3 capsules daily. PA approved dates are 10/20/17-10/20/18, PA ID number is [id number]. Patient advised and called [facility] and advised them. Pharmacist did say that his co pay for this medication will be $60. I advised pharmacist to let me know if patient wants to switch to something else. Kris Mouton, RMA

## 2017-10-26 ENCOUNTER — Telehealth: Payer: Self-pay | Admitting: Family Medicine

## 2017-10-26 NOTE — Telephone Encounter (Signed)
Pt is requesting a lab slip to have labs recheck.  CB#[phone]/MW

## 2017-10-27 NOTE — Telephone Encounter (Signed)
Pt advised on voicemail lab slip up front-Anastasiya V Hopkins, RMA

## 2017-11-01 LAB — CBC WITH DIFFERENTIAL/PLATELET
BASOS ABS: 48 {cells}/uL (ref 0–200)
Basophils Relative: 0.7 %
EOS ABS: 62 {cells}/uL (ref 15–500)
EOS PCT: 0.9 %
HEMATOCRIT: 49.5 % (ref 38.5–50.0)
HEMOGLOBIN: 17.6 g/dL — AB (ref 13.2–17.1)
Lymphs Abs: 1925 cells/uL (ref 850–3900)
MCH: 33 pg (ref 27.0–33.0)
MCHC: 35.6 g/dL (ref 32.0–36.0)
MCV: 92.7 fL (ref 80.0–100.0)
MPV: 9.4 fL (ref 7.5–12.5)
Monocytes Relative: 10.7 %
Neutro Abs: 4126 cells/uL (ref 1500–7800)
Neutrophils Relative %: 59.8 %
Platelets: 239 10*3/uL (ref 140–400)
RBC: 5.34 10*6/uL (ref 4.20–5.80)
RDW: 12 % (ref 11.0–15.0)
Total Lymphocyte: 27.9 %
WBC: 6.9 10*3/uL (ref 3.8–10.8)
WBCMIX: 738 {cells}/uL (ref 200–950)

## 2017-12-03 ENCOUNTER — Ambulatory Visit (INDEPENDENT_AMBULATORY_CARE_PROVIDER_SITE_OTHER): Payer: Self-pay | Admitting: Family Medicine

## 2017-12-03 ENCOUNTER — Encounter: Payer: Self-pay | Admitting: Family Medicine

## 2017-12-03 VITALS — BP 160/112 | HR 118 | Temp 98.4°F | Resp 16 | Wt 213.0 lb

## 2017-12-03 DIAGNOSIS — B349 Viral infection, unspecified: Secondary | ICD-10-CM

## 2017-12-03 LAB — POCT INFLUENZA A/B
INFLUENZA B, POC: NEGATIVE
Influenza A, POC: NEGATIVE

## 2017-12-03 MED ORDER — HYDROCODONE-HOMATROPINE 5-1.5 MG/5ML PO SYRP: ORAL_SOLUTION | ORAL | 0 refills | Status: DC

## 2017-12-03 MED ORDER — DOXYCYCLINE HYCLATE 100 MG PO TABS: 100.0000 mg | ORAL_TABLET | Freq: Two times a day (BID) | ORAL | 0 refills | Status: DC

## 2017-12-03 NOTE — Progress Notes (Signed)
Subjective:     Patient ID: Russell Simpson, male   DOB: 19-Jun-1961, 57 y.o.   MRN: [id number] Chief Complaint  Patient presents with  . URI    Patient comes in office today with concerns of possible flu exposre. Patient reports for the past 7 days the following; cough, body ache, congestion/runny nose, diarrhea and low grade fever. Patient has tried taking otc Aleve, Tylenol and Robitussin DM   HPI Girlfriend, who accompanies him, has been sick as well. Reports sinus congestion remains purulent.  Review of Systems     Objective:   Physical Exam  Constitutional: He appears well-developed and well-nourished. No distress.  Ears: T.M's intact without inflammation Sinuses:mild paranasal sinus tenderness Throat: no tonsillar enlargement or exudate Neck: no cervical adenopathy Lungs: clear     Assessment:    1. Acute viral syndrome - POCT Influenza A/B - HYDROcodone-homatropine (HYCODAN) 5-1.5 MG/5ML syrup; 5 ml 4-6 hours as needed for cough  Dispense: 120 mL; Refill: 0    Plan:    Avoid decongestants. Nurse bp check once well. Discussed otc medication. Start abx if sinuses not improving over the next two days.

## 2017-12-03 NOTE — Patient Instructions (Addendum)
Use saline nasal spray for nasal congestion and Mucinex for an expectorant. Continue Allegra for nasal drip as needed. If sinus drainage not clearing over the weekend start the antibiotic.

## 2018-01-06 ENCOUNTER — Ambulatory Visit: Payer: BLUE CROSS/BLUE SHIELD | Admitting: Family Medicine

## 2018-01-06 VITALS — BP 140/100 | HR 104 | Temp 98.0°F | Resp 18 | Wt 220.0 lb

## 2018-01-06 DIAGNOSIS — G4733 Obstructive sleep apnea (adult) (pediatric): Secondary | ICD-10-CM

## 2018-01-06 DIAGNOSIS — F331 Major depressive disorder, recurrent, moderate: Secondary | ICD-10-CM | POA: Diagnosis not present

## 2018-01-06 DIAGNOSIS — D352 Benign neoplasm of pituitary gland: Secondary | ICD-10-CM | POA: Diagnosis not present

## 2018-01-06 DIAGNOSIS — I10 Essential (primary) hypertension: Secondary | ICD-10-CM

## 2018-01-06 DIAGNOSIS — F419 Anxiety disorder, unspecified: Secondary | ICD-10-CM

## 2018-01-06 MED ORDER — METOPROLOL SUCCINATE ER 25 MG PO TB24: 25.0000 mg | ORAL_TABLET | Freq: Every day | ORAL | 12 refills | Status: DC

## 2018-01-06 NOTE — Progress Notes (Signed)
Russell Simpson  MRN: [id number] DOB: [birth year]  Subjective:  HPI  The patient is a 57 year old male who presents for follow up of chronic illness.  He was last seen for an acute visit on 12/03/17.   Alcoholism-The patient states he is down to drinking just 3 beers per day along with 3 non alcoholic beers.  He states with his current circumstances it makes him want to drink even more.    He has been released from work after suffering a neck injury.  This is causing increased financial stress.  He has been denied unemployment.  Hypertension-the patient states that when he was seen on his last visit his blood pressure was elevated and he was instructed to follow up.  His blood pressure on the last visit was 160/112.  Today his reading is 160/100.  The patient also mentioned his desire to file for disability because of his neck and he was advised that this would probably need to be obtained through his surgeons office.  Patient Active Problem List   Diagnosis Date Noted  . Disorder of bursae of shoulder region 07/21/2017  . Full thickness rotator cuff tear 07/21/2017  . Localized, primary osteoarthritis 07/21/2017  . 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 Osteoarthritis of knee 07/21/2017  . Anxiety 01/10/2016  . BP (high blood pressure) 01/10/2016  . 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 Apnea, sleep 01/10/2016  . Hyperlipidemia 10/15/2015  . Pituitary adenoma ([place]) 10/15/2015  . 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 Depression 10/15/2015  . 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 BPH without obstruction/lower urinary tract symptoms 10/15/2015  . Allergic rhinitis 10/15/2015  . Incomplete bladder emptying 05/30/2015  . Hernia, inguinal, left 09/21/2014  . Benign prostatic hyperplasia with urinary obstruction 10/24/2012  . 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 OSTEOMYELITIS, ACUTE, OTHER [facility] SITE 06/09/2006    Past Medical History:  Diagnosis Date  . Anterior  pituitary disorder ([place])   . Arthritis   . Asthma   . 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 Chronic pain    right arm  . Depression   . Environmental and seasonal allergies   . Pneumonia   . Sleep apnea    does not wear CPAP ; uses humidifier instead    Social History   Socioeconomic History  . Marital status: Married    Spouse name: Not on file  . Number of children: Not on file  . Years of education: Not on file  . Highest education level: Not on file  Social Needs  . Financial resource strain: Not on file  . Food insecurity - worry: Not on file  . Food insecurity - inability: Not on file  . Transportation needs - medical: Not on file  . Transportation needs - non-medical: Not on file  Occupational History  . Not on file  Tobacco Use  . Smoking status: Light Tobacco Smoker    Types: E-cigarettes, Cigars  . Smokeless tobacco: Never Used  . Tobacco comment: " sometimes during the day and at night"  Substance and Sexual Activity  . Alcohol use: Yes    Alcohol/week: 0.0 oz    Comment: 12 pack a beer a night  . Drug use: No  . Sexual activity: Not on file  Other Topics Concern  . Not on file  Social History Narrative  . Not on file    Outpatient Encounter Medications as of 01/06/2018  Medication Sig  .  Arginine 2000 MG PACK Take by mouth daily.  . cabergoline (DOSTINEX) 0.5 MG tablet Take 0.25 mg by mouth 2 (two) times a week.  Carin Hock Iron (PERFECT IRON) 25 MG TABS Take by mouth daily.  . clonazePAM (KLONOPIN) 0.5 MG tablet Take 1 tablet (0.5 mg total) by mouth 2 (two) times daily as needed for anxiety. Or Stress.  . Coenzyme Q10 (COQ-10) 100 MG CAPS Take by mouth daily.  . Cyanocobalamin (B-12) 5000 MCG SUBL Place under the tongue daily.  . DULoxetine (CYMBALTA) 30 MG capsule Take 3 capsules once daily  . finasteride (PROSCAR) 5 MG tablet Take 5 mg by mouth daily.  . Ginger, Zingiber officinalis, (GINGER PO) Take by mouth.  . Glucosamine  500 MG CAPS Take by mouth daily.  [name] loratadine (CLARITIN) 10 MG tablet Take 10 mg by mouth daily.   . magnesium oxide (MAG-OX) 400 MG tablet Take 400 mg by mouth daily.  . meloxicam (MOBIC) 15 MG tablet   . methocarbamol (ROBAXIN) 500 MG tablet Take 500 mg by mouth 4 (four) times daily.  . montelukast (SINGULAIR) 10 MG tablet Take 1 tablet (10 mg total) by mouth at bedtime.  . naproxen sodium (ANAPROX) 220 MG tablet Take 220 mg by mouth 2 (two) times daily with a meal.  . Omega-3 Fatty Acids (FISH OIL) 1000 MG CAPS Take by mouth.  [name] OVER THE COUNTER MEDICATION OREGA MAX  . OVER THE COUNTER MEDICATION RED MARINE ALGAE-375 MG.  Abe People HFA 108 (90 Base) MCG/ACT inhaler Inhale 1 puff into the lungs every 6 (six) hours as needed for wheezing.  . sildenafil (REVATIO) 20 MG tablet 3-5 tablets daily as needed for erectile dysfunction  . SODIUM CHLORIDE, EXTERNAL, ([facility]S SALINE WOUND [place]) 0.9 % SOLN Apply 2 application topically 2 (two) times daily.  . tamsulosin (FLOMAX) 0.4 MG CAPS capsule Take 0.4 mg by mouth daily.   [name] testosterone cypionate (DEPOTESTOTERONE CYPIONATE) 100 MG/ML injection Inject 100 mg into the muscle once a week.   . traMADol (ULTRAM) 50 MG tablet Take by mouth every 6 (six) hours as needed.  . [DISCONTINUED] doxycycline (VIBRA-TABS) 100 MG tablet Take 1 tablet (100 mg total) by mouth 2 (two) times daily.  . [DISCONTINUED] HYDROcodone-homatropine (HYCODAN) 5-1.5 MG/5ML syrup 5 ml 4-6 hours as needed for cough   No facility-administered encounter medications on file as of 01/06/2018.     Allergies  Allergen Reactions  . Penicillins Anaphylaxis  . Tetanus Toxoids Swelling  . Lisinopril     Other reaction(s): Other (See Comments) Other reaction(s): Cough Other reaction(s): Cough Other reaction(s): Cough Other reaction(s): Cough  . Losartan     Other reaction(s): Other (See Comments) Other reaction(s): Muscle Pain Other reaction(s): Muscle Pain Other reaction(s): Muscle  Pain  . Tetanus Toxoid   . Codeine Nausea Only    Review of Systems  Constitutional: Positive for diaphoresis and malaise/fatigue. Negative for fever.  Respiratory: Positive for cough and sputum production. Negative for shortness of breath and wheezing.   Cardiovascular: Negative for chest pain, palpitations, orthopnea, claudication and leg swelling.  Musculoskeletal: Positive for back pain and neck pain (radiates down his back). Negative for falls, joint pain and myalgias.  Neurological: Positive for weakness. Negative for dizziness and headaches.  Psychiatric/Behavioral: Positive for depression. Negative for hallucinations, memory loss, substance abuse and suicidal ideas. The patient is nervous/anxious. The patient does not have insomnia.     Objective:  BP (!) 140/100 (BP Location: Right Arm, Patient Position: Sitting, Cuff  Size: Normal)   Pulse (!) 104   Temp 98 F (36.7 C) (Oral)   Resp 18   Wt 220 lb (99.8 kg)   BMI 28.25 kg/m   Physical Exam  Constitutional: He is oriented to person, place, and time and well-developed, well-nourished, and in no distress.  HENT:  Head: Normocephalic and atraumatic.  Eyes: Conjunctivae are normal. Pupils are equal, round, and reactive to light.  Neck: Normal range of motion.  Cardiovascular: Normal rate, regular rhythm and normal heart sounds.  Pulmonary/Chest: Effort normal and breath sounds normal.  Abdominal: Soft.  Neurological: He is alert and oriented to person, place, and time.  Skin: Skin is warm and dry.  Ruddy complexion of face.  Psychiatric: Mood, memory, affect and judgment normal.    Assessment and Plan :  1. Essential hypertension  - metoprolol succinate (TOPROL-XL) 25 MG 24 hr tablet; Take 1 tablet (25 mg total) by mouth daily.  Dispense: 30 tablet; Refill: 12 2.Alcoholism Pt advised to quit. 3.MDD 4.Stress/job stress 5.Neck pain Pt advised to seek specialty care.  start with xray.  I have done the exam and  reviewed the chart and it is accurate to the best of my knowledge. Development worker, community has been used and  any errors in dictation or transcription are unintentional. Miguel Aschoff M.D. [facility]

## 2018-01-18 ENCOUNTER — Other Ambulatory Visit: Payer: Self-pay | Admitting: Family Medicine

## 2018-01-18 DIAGNOSIS — F439 Reaction to severe stress, unspecified: Secondary | ICD-10-CM

## 2018-01-18 NOTE — Telephone Encounter (Signed)
[facility] faxed a refill request on the following medication. Thanks CC  clonazePAM (KLONOPIN) 0.5 MG tablet

## 2018-01-18 NOTE — Telephone Encounter (Signed)
Please review. Thanks!  

## 2018-01-19 MED ORDER — CLONAZEPAM 0.5 MG PO TABS: 0.5000 mg | ORAL_TABLET | Freq: Two times a day (BID) | ORAL | 2 refills | Status: DC | PRN

## 2018-01-21 ENCOUNTER — Other Ambulatory Visit: Payer: Self-pay | Admitting: Orthopedic Surgery

## 2018-01-21 DIAGNOSIS — M5412 Radiculopathy, cervical region: Secondary | ICD-10-CM

## 2018-01-27 ENCOUNTER — Ambulatory Visit
Admission: RE | Admit: 2018-01-27 | Discharge: 2018-01-27 | Disposition: A | Discharge status: Home / Self Care | Payer: BLUE CROSS/BLUE SHIELD | Source: Ambulatory Visit | Attending: Orthopedic Surgery | Admitting: Orthopedic Surgery

## 2018-01-27 DIAGNOSIS — M5412 Radiculopathy, cervical region: Secondary | ICD-10-CM

## 2018-02-03 ENCOUNTER — Other Ambulatory Visit: Payer: 59

## 2018-03-10 ENCOUNTER — Ambulatory Visit: Payer: BLUE CROSS/BLUE SHIELD | Admitting: Family Medicine

## 2018-03-10 VITALS — BP 180/80 | HR 100 | Temp 98.1°F | Resp 18 | Wt 223.0 lb

## 2018-03-10 DIAGNOSIS — M5090 Cervical disc disorder, unspecified, unspecified cervical region: Secondary | ICD-10-CM

## 2018-03-10 DIAGNOSIS — I10 Essential (primary) hypertension: Secondary | ICD-10-CM

## 2018-03-10 DIAGNOSIS — E291 Testicular hypofunction: Secondary | ICD-10-CM

## 2018-03-10 DIAGNOSIS — D352 Benign neoplasm of pituitary gland: Secondary | ICD-10-CM

## 2018-03-10 MED ORDER — AMLODIPINE BESYLATE 5 MG PO TABS: 5.0000 mg | ORAL_TABLET | Freq: Every day | ORAL | 3 refills | Status: DC

## 2018-03-10 NOTE — Progress Notes (Signed)
Russell Simpson  MRN: [id number] DOB: 1961/07/23  Subjective:  HPI   The patient is a 57 year old male who presents for follow up of his hypertension.  On his last visit his blood pressure was 140/100 and he was started on Metoprolol.  He returns today stating that he is taking the Metoprolol.  He states he has increased pain in his neck and frustration over the fact that they can not do nay more surgery to correct the neck pain.  He said he is set up with the pain clinic in about 1 week.    He continues to drink 3 regular beers per day with 3 non-alcohol beers per day. Emotionally seems to be doing better.   Patient Active Problem List   Diagnosis Date Noted  . Disorder of bursae of shoulder region 07/21/2017  . Full thickness rotator cuff tear 07/21/2017  . Localized, primary osteoarthritis 07/21/2017  . 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 Osteoarthritis of knee 07/21/2017  . Anxiety 01/10/2016  . BP (high blood pressure) 01/10/2016  . 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 Apnea, sleep 01/10/2016  . Hyperlipidemia 10/15/2015  . Pituitary adenoma ([place]) 10/15/2015  . 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 Depression 10/15/2015  . 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 BPH without obstruction/lower urinary tract symptoms 10/15/2015  . Allergic rhinitis 10/15/2015  . Incomplete bladder emptying 05/30/2015  . Hernia, inguinal, left 09/21/2014  . Benign prostatic hyperplasia with urinary obstruction 10/24/2012  . 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 OSTEOMYELITIS, ACUTE, OTHER [facility] SITE 06/09/2006    Past Medical History:  Diagnosis Date  . 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 Arthritis   . Asthma   . 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 Chronic pain    right arm  . Depression   . Environmental and seasonal allergies   . Pneumonia   . Sleep apnea    does not wear CPAP ; uses humidifier instead    Social History    Socioeconomic History  . Marital status: Married    Spouse name: Not on file  . Number of children: Not on file  . Years of education: Not on file  . Highest education level: Not on file  Occupational History  . Not on file  Social Needs  . Financial resource strain: Not on file  . Food insecurity:    Worry: Not on file    Inability: Not on file  . Transportation needs:    Medical: Not on file    Non-medical: Not on file  Tobacco Use  . Smoking status: Light Tobacco Smoker    Types: E-cigarettes, Cigars  . Smokeless tobacco: Never Used  . Tobacco comment: " sometimes during the day and at night"  Substance and Sexual Activity  . Alcohol use: Yes    Alcohol/week: 0.0 oz    Comment: 12 pack a beer a night  . Drug use: No  . Sexual activity: Not on file  Lifestyle  . Physical activity:    Days per week: Not on file    Minutes per session: Not on file  . Stress: Not on file  Relationships  . Social connections:    Talks on phone: Not on file    Gets together: Not on file    Attends religious service: Not on file    Active member of club or organization: Not on file    Attends meetings of clubs  or organizations: Not on file    Relationship status: Not on file  . Intimate partner violence:    Fear of current or ex partner: Not on file    Emotionally abused: Not on file    Physically abused: Not on file    Forced sexual activity: Not on file  Other Topics Concern  . Not on file  Social History Narrative  . Not on file    Outpatient Encounter Medications as of 03/10/2018  Medication Sig  . Arginine 2000 MG PACK Take by mouth daily.  . cabergoline (DOSTINEX) 0.5 MG tablet Take 0.25 mg by mouth 2 (two) times a week.  Carin Hock Iron (PERFECT IRON) 25 MG TABS Take by mouth daily.  . clonazePAM (KLONOPIN) 0.5 MG tablet Take 1 tablet (0.5 mg total) by mouth 2 (two) times daily as needed for anxiety. Or Stress.  . Coenzyme Q10 (COQ-10) 100 MG CAPS Take by mouth daily.   . Cyanocobalamin (B-12) 5000 MCG SUBL Place under the tongue daily.  [name] DHEA 50 MG CAPS Take by mouth.  . DULoxetine (CYMBALTA) 30 MG capsule Take 3 capsules once daily  . finasteride (PROSCAR) 5 MG tablet Take 5 mg by mouth daily.  . Ginger, Zingiber officinalis, (GINGER PO) Take by mouth.  . Glucosamine 500 MG CAPS Take by mouth daily.  [name] loratadine (CLARITIN) 10 MG tablet Take 10 mg by mouth daily.   . magnesium oxide (MAG-OX) 400 MG tablet Take 400 mg by mouth daily.  . meloxicam (MOBIC) 15 MG tablet   . methocarbamol (ROBAXIN) 500 MG tablet Take 500 mg by mouth 4 (four) times daily.  . metoprolol succinate (TOPROL-XL) 25 MG 24 hr tablet Take 1 tablet (25 mg total) by mouth daily.  . montelukast (SINGULAIR) 10 MG tablet Take 1 tablet (10 mg total) by mouth at bedtime.  . naproxen sodium (ANAPROX) 220 MG tablet Take 220 mg by mouth 2 (two) times daily with a meal.  . Omega-3 Fatty Acids (FISH OIL) 1000 MG CAPS Take by mouth.  [name] OVER THE COUNTER MEDICATION OREGA MAX  . OVER THE COUNTER MEDICATION RED MARINE ALGAE-375 MG.  Abe People HFA 108 (90 Base) MCG/ACT inhaler Inhale 1 puff into the lungs every 6 (six) hours as needed for wheezing.  . sildenafil (REVATIO) 20 MG tablet 3-5 tablets daily as needed for erectile dysfunction  . SODIUM CHLORIDE, EXTERNAL, (CVS SALINE WOUND [place]) 0.9 % SOLN Apply 2 application topically 2 (two) times daily.  . tamsulosin (FLOMAX) 0.4 MG CAPS capsule Take 0.4 mg by mouth daily.   [name] testosterone cypionate (DEPOTESTOTERONE CYPIONATE) 100 MG/ML injection Inject 100 mg into the muscle once a week.   . traMADol (ULTRAM) 50 MG tablet Take by mouth every 6 (six) hours as needed.   No facility-administered encounter medications on file as of 03/10/2018.     Allergies  Allergen Reactions  . Penicillins Anaphylaxis  . Tetanus Toxoids Swelling  . Lisinopril     Other reaction(s): Other (See Comments) Other reaction(s): Cough Other reaction(s): Cough Other  reaction(s): Cough Other reaction(s): Cough  . Losartan     Other reaction(s): Other (See Comments) Other reaction(s): Muscle Pain Other reaction(s): Muscle Pain Other reaction(s): Muscle Pain  . Tetanus Toxoid   . Codeine Nausea Only    Review of Systems  Constitutional: Positive for malaise/fatigue. Negative for fever and weight loss.  Eyes: Negative.   Respiratory: Negative for cough, shortness of breath and wheezing.   Cardiovascular: Negative for chest  pain, palpitations, orthopnea, claudication and leg swelling.  Gastrointestinal: Negative.   Skin: Negative.   Neurological: Negative for dizziness, weakness and headaches.  Endo/Heme/Allergies: Negative.   Psychiatric/Behavioral: Negative.     Objective:  BP (!) 180/80 (BP Location: Right Arm, Patient Position: Sitting, Cuff Size: Normal)   Pulse 100   Temp 98.1 F (36.7 C) (Oral)   Resp 18   Wt 223 lb (101.2 kg)   BMI 28.63 kg/m   Blood pressure was with doppler.  Without doppler 164/110  Physical Exam  Constitutional: He is oriented to person, place, and time and well-developed, well-nourished, and in no distress.  HENT:  Head: Normocephalic and atraumatic.  Eyes: Conjunctivae are normal. No scleral icterus.  Neck: No thyromegaly present.  Cardiovascular: Normal rate, regular rhythm and normal heart sounds.  Pulmonary/Chest: Effort normal and breath sounds normal.  Abdominal: Soft.  Lymphadenopathy:    He has no cervical adenopathy.  Neurological: He is alert and oriented to person, place, and time. Gait normal. GCS score is 15.  Skin: Skin is warm and dry.  Psychiatric: Mood, memory, affect and judgment normal.    Assessment and Plan :  1. Essential hypertension RTC 1 -2 months with addition of norvasc. - amLODipine (NORVASC) 5 MG tablet; Take 1 tablet (5 mg total) by mouth daily.  Dispense: 90 tablet; Refill: 3 2.Cervical DD Appt at pain clinic next week. I will not prescribe narcotics for this pt due to  alcohol use. 3.Alcoholism 4.Depression 5.h/o Hypogonadism  I have done the exam and reviewed the chart and it is accurate to the best of my knowledge. Development worker, community has been used and  any errors in dictation or transcription are unintentional. Miguel Aschoff M.D. [facility]

## 2018-03-10 NOTE — Patient Instructions (Signed)
Check with the medicines at home to see if taking Naproxen

## 2018-03-29 ENCOUNTER — Ambulatory Visit: Payer: BLUE CROSS/BLUE SHIELD | Admitting: Family Medicine

## 2018-03-29 ENCOUNTER — Encounter: Payer: Self-pay | Admitting: Family Medicine

## 2018-03-29 VITALS — BP 142/100 | HR 88 | Temp 98.4°F | Resp 14 | Wt 220.0 lb

## 2018-03-29 DIAGNOSIS — F32A Depression, unspecified: Secondary | ICD-10-CM

## 2018-03-29 DIAGNOSIS — I10 Essential (primary) hypertension: Secondary | ICD-10-CM | POA: Diagnosis not present

## 2018-03-29 DIAGNOSIS — M5090 Cervical disc disorder, unspecified, unspecified cervical region: Secondary | ICD-10-CM | POA: Diagnosis not present

## 2018-03-29 DIAGNOSIS — F329 Major depressive disorder, single episode, unspecified: Secondary | ICD-10-CM

## 2018-03-29 MED ORDER — DULOXETINE HCL 30 MG PO CPEP: ORAL_CAPSULE | ORAL | 5 refills | Status: DC

## 2018-03-29 MED ORDER — GABAPENTIN 100 MG PO CAPS: 100.0000 mg | ORAL_CAPSULE | Freq: Three times a day (TID) | ORAL | 3 refills | Status: DC

## 2018-03-29 NOTE — Patient Instructions (Addendum)
Stop Clonazepam, Tramadol, Meloxicam. Decrease Cymbalta to 2 capsules daily. Start Gabapentin (sent to pharmacy) start with 1 tablet daily then increase by one tablet each week until you get to three tablets daily. Follow up 1-2 months.

## 2018-03-29 NOTE — Progress Notes (Signed)
Patient: Russell Simpson Male    DOB: 1961-04-02   57 y.o.   MRN: [id number] Visit Date: 03/29/2018  Today's Provider: Wilhemena Durie, MD   Chief Complaint  Patient presents with  . Hypertension   Subjective:    HPI Pt is here today for a 1 month follow up of HTN. He was started on Amlodipine. He has not been checking his BP at home. He reports that his pain management doctor want his to change up some of his medications. Wants a replacement for Clonazepam, Tramadol and Stop Meloxicam and start Gabapentin.     Allergies  Allergen Reactions  . Penicillins Anaphylaxis  . Tetanus Toxoids Swelling  . Lisinopril     Other reaction(s): Other (See Comments) Other reaction(s): Cough Other reaction(s): Cough Other reaction(s): Cough Other reaction(s): Cough  . Losartan     Other reaction(s): Other (See Comments) Other reaction(s): Muscle Pain Other reaction(s): Muscle Pain Other reaction(s): Muscle Pain  . Tetanus Toxoid   . Codeine Nausea Only     Current Outpatient Medications:  .  amLODipine (NORVASC) 5 MG tablet, Take 1 tablet (5 mg total) by mouth daily., Disp: 90 tablet, Rfl: 3 .  Carbonyl Iron (PERFECT IRON) 25 MG TABS, Take by mouth daily., Disp: , Rfl:  .  clonazePAM (KLONOPIN) 0.5 MG tablet, Take 1 tablet (0.5 mg total) by mouth 2 (two) times daily as needed for anxiety. Or Stress., Disp: 60 tablet, Rfl: 2 .  Coenzyme Q10 (COQ-10) 100 MG CAPS, Take by mouth daily., Disp: , Rfl:  .  Cyanocobalamin (B-12) 5000 MCG SUBL, Place under the tongue daily., Disp: , Rfl:  .  DULoxetine (CYMBALTA) 30 MG capsule, Take 3 capsules once daily, Disp: 270 capsule, Rfl: 1 .  finasteride (PROSCAR) 5 MG tablet, Take 5 mg by mouth daily., Disp: , Rfl: 2 .  Ginger, Zingiber officinalis, (GINGER PO), Take by mouth., Disp: , Rfl:  .  Glucosamine 500 MG CAPS, Take by mouth daily., Disp: , Rfl:  .  loratadine (CLARITIN) 10 MG tablet, Take 10 mg by mouth daily. , Disp: , Rfl:  .   meloxicam (MOBIC) 15 MG tablet, , Disp: , Rfl:  .  methocarbamol (ROBAXIN) 500 MG tablet, Take 500 mg by mouth 4 (four) times daily., Disp: , Rfl:  .  metoprolol succinate (TOPROL-XL) 25 MG 24 hr tablet, Take 1 tablet (25 mg total) by mouth daily., Disp: 30 tablet, Rfl: 12 .  montelukast (SINGULAIR) 10 MG tablet, Take 1 tablet (10 mg total) by mouth at bedtime., Disp: 30 tablet, Rfl: 5 .  Omega-3 Fatty Acids (FISH OIL) 1000 MG CAPS, Take by mouth., Disp: , Rfl:  .  testosterone cypionate (DEPOTESTOTERONE CYPIONATE) 100 MG/ML injection, Inject 100 mg into the muscle once a week. , Disp: , Rfl:  .  traMADol (ULTRAM) 50 MG tablet, Take by mouth every 6 (six) hours as needed., Disp: , Rfl:  .  Arginine 2000 MG PACK, Take by mouth daily., Disp: , Rfl:  .  cabergoline (DOSTINEX) 0.5 MG tablet, Take 0.25 mg by mouth 2 (two) times a week., Disp: , Rfl:  .  DHEA 50 MG CAPS, Take by mouth., Disp: , Rfl:  .  magnesium oxide (MAG-OX) 400 MG tablet, Take 400 mg by mouth daily., Disp: , Rfl:  .  naproxen sodium (ANAPROX) 220 MG tablet, Take 220 mg by mouth 2 (two) times daily with a meal., Disp: , Rfl:  .  OVER  THE COUNTER MEDICATION, OREGA MAX, Disp: , Rfl:  .  OVER THE COUNTER MEDICATION, RED MARINE ALGAE-375 MG., Disp: , Rfl:  .  PROAIR HFA 108 (90 Base) MCG/ACT inhaler, Inhale 1 puff into the lungs every 6 (six) hours as needed for wheezing., Disp: 18 g, Rfl: 2 .  sildenafil (REVATIO) 20 MG tablet, 3-5 tablets daily as needed for erectile dysfunction, Disp: , Rfl:  .  SODIUM CHLORIDE, EXTERNAL, (CVS SALINE WOUND [place]) 0.9 % SOLN, Apply 2 application topically 2 (two) times daily., Disp: 210 mL, Rfl: 0 .  tamsulosin (FLOMAX) 0.4 MG CAPS capsule, Take 0.4 mg by mouth daily. , Disp: , Rfl:   Review of Systems  Constitutional: Negative.   HENT: Negative.   Eyes: Negative.   Respiratory: Negative.   Cardiovascular: Negative.   Gastrointestinal: Negative.   Endocrine: Negative.   Genitourinary: Negative.    Musculoskeletal: Positive for back pain and neck pain.  Skin: Negative.   Allergic/Immunologic: Negative.   Neurological: Negative.   Hematological: Negative.   Psychiatric/Behavioral: Negative.     Social History   Tobacco Use  . Smoking status: Light Tobacco Smoker    Types: E-cigarettes, Cigars  . Smokeless tobacco: Never Used  . Tobacco comment: " sometimes during the day and at night"  Substance Use Topics  . Alcohol use: Yes    Alcohol/week: 0.0 oz    Comment: 12 pack a beer a night   Objective:   BP (!) 142/100 (BP Location: Right Arm, Patient Position: Sitting, Cuff Size: Normal)   Pulse 88   Temp 98.4 F (36.9 C) (Oral)   Resp 14   Wt 220 lb (99.8 kg)   BMI 28.25 kg/m  Vitals:   03/29/18 1636  BP: (!) 142/100  Pulse: 88  Resp: 14  Temp: 98.4 F (36.9 C)  TempSrc: Oral  Weight: 220 lb (99.8 kg)     Physical Exam  Constitutional: He is oriented to person, place, and time. He appears well-developed and well-nourished.  HENT:  Head: Normocephalic and atraumatic.  Eyes: No scleral icterus.  Neck: No thyromegaly present.  Cardiovascular: Normal rate, regular rhythm and normal heart sounds.  Pulmonary/Chest: Effort normal and breath sounds normal.  Abdominal: Soft.  Lymphadenopathy:    He has no cervical adenopathy.  Neurological: He is alert and oriented to person, place, and time.  Skin: Skin is warm and dry.  Psychiatric: He has a normal mood and affect. His behavior is normal. Judgment and thought content normal.        Assessment & Plan:     1. Essential hypertension   2. Cervical disc disease Per pain clinic. We will not give pt any narcotics for chronic pain. - gabapentin (NEURONTIN) 100 MG capsule; Take 1 capsule (100 mg total) by mouth 3 (three) times daily.  Dispense: 90 capsule; Refill: 3  3. Depression, unspecified depression type  - DULoxetine (CYMBALTA) 30 MG capsule; Take 2 capsules once daily  Dispense: 60 capsule; Refill:  5 4.Alcoholism Pt advised to quit completely. Avoid Benzos.      Janie Strothman Cranford Mon, MD  [facility]

## 2018-04-01 ENCOUNTER — Telehealth: Payer: Self-pay | Admitting: Family Medicine

## 2018-04-01 ENCOUNTER — Other Ambulatory Visit: Payer: Self-pay | Admitting: Family Medicine

## 2018-04-01 MED ORDER — PROAIR HFA 108 (90 BASE) MCG/ACT IN AERS: 2.0000 | INHALATION_SPRAY | Freq: Four times a day (QID) | RESPIRATORY_TRACT | 2 refills | Status: DC | PRN

## 2018-04-01 NOTE — Telephone Encounter (Signed)
pt called needing his inhaler.  He said he asked for it about three days ago and is needing it for his asthma  Russell Simpson  Thanks teri

## 2018-04-01 NOTE — Telephone Encounter (Signed)
Please review patient uses Pro-Air inhaler. KW

## 2018-04-01 NOTE — Telephone Encounter (Signed)
Proair sent in  ?

## 2018-04-13 ENCOUNTER — Ambulatory Visit: Payer: BLUE CROSS/BLUE SHIELD | Admitting: Physician Assistant

## 2018-04-13 ENCOUNTER — Encounter: Payer: Self-pay | Admitting: Physician Assistant

## 2018-04-13 VITALS — BP 142/98 | HR 96 | Temp 97.7°F | Resp 20 | Wt 225.0 lb

## 2018-04-13 DIAGNOSIS — J019 Acute sinusitis, unspecified: Secondary | ICD-10-CM | POA: Diagnosis not present

## 2018-04-13 DIAGNOSIS — R062 Wheezing: Secondary | ICD-10-CM

## 2018-04-13 MED ORDER — DOXYCYCLINE HYCLATE 100 MG PO TABS: 100.0000 mg | ORAL_TABLET | Freq: Two times a day (BID) | ORAL | 0 refills | Status: AC

## 2018-04-13 MED ORDER — PREDNISONE 20 MG PO TABS: 20.0000 mg | ORAL_TABLET | Freq: Every day | ORAL | 0 refills | Status: AC

## 2018-04-13 NOTE — Progress Notes (Signed)
Patient: Russell Simpson Male    DOB: 1961/07/13   57 y.o.   MRN: [id number] Visit Date: 04/13/2018  Today's Provider: Trinna Post, PA-C   Chief Complaint  Patient presents with  . URI    Started 11 days ago.  . Sinusitis   Subjective:    URI   This is a new problem. The current episode started 1 to 4 weeks ago. The problem has been gradually worsening. Associated symptoms include congestion, coughing, neck pain, rhinorrhea, sinus pain, swollen glands and wheezing. Pertinent negatives include no ear pain, headaches or sore throat. He has tried acetaminophen (Z-pak; tussinex) for the symptoms. The treatment provided no relief.  Sinusitis  This is a new problem. The current episode started 1 to 4 weeks ago. The problem has been gradually worsening since onset. Associated symptoms include chills, congestion, coughing, diaphoresis, neck pain, shortness of breath, sinus pressure and swollen glands. Pertinent negatives include no ear pain, headaches or sore throat. Past treatments include acetaminophen. The treatment provided no relief.       Allergies  Allergen Reactions  . Penicillins Anaphylaxis  . Tetanus Toxoids Swelling  . Lisinopril     Other reaction(s): Other (See Comments) Other reaction(s): Cough Other reaction(s): Cough Other reaction(s): Cough Other reaction(s): Cough  . Losartan     Other reaction(s): Other (See Comments) Other reaction(s): Muscle Pain Other reaction(s): Muscle Pain Other reaction(s): Muscle Pain  . Tetanus Toxoid   . Codeine Nausea Only     Current Outpatient Medications:  .  amLODipine (NORVASC) 5 MG tablet, Take 1 tablet (5 mg total) by mouth daily., Disp: 90 tablet, Rfl: 3 .  Arginine 2000 MG PACK, Take by mouth daily., Disp: , Rfl:  .  cabergoline (DOSTINEX) 0.5 MG tablet, Take 0.25 mg by mouth 2 (two) times a week., Disp: , Rfl:  .  Carbonyl Iron (PERFECT IRON) 25 MG TABS, Take by mouth daily., Disp: , Rfl:  .  Coenzyme Q10  (COQ-10) 100 MG CAPS, Take by mouth daily., Disp: , Rfl:  .  Cyanocobalamin (B-12) 5000 MCG SUBL, Place under the tongue daily., Disp: , Rfl:  .  DHEA 50 MG CAPS, Take by mouth., Disp: , Rfl:  .  DULoxetine (CYMBALTA) 30 MG capsule, Take 2 capsules once daily, Disp: 60 capsule, Rfl: 5 .  finasteride (PROSCAR) 5 MG tablet, Take 5 mg by mouth daily., Disp: , Rfl: 2 .  gabapentin (NEURONTIN) 100 MG capsule, Take 1 capsule (100 mg total) by mouth 3 (three) times daily., Disp: 90 capsule, Rfl: 3 .  Ginger, Zingiber officinalis, (GINGER PO), Take by mouth., Disp: , Rfl:  .  loratadine (CLARITIN) 10 MG tablet, Take 10 mg by mouth daily. , Disp: , Rfl:  .  magnesium oxide (MAG-OX) 400 MG tablet, Take 400 mg by mouth daily., Disp: , Rfl:  .  methocarbamol (ROBAXIN) 500 MG tablet, Take 500 mg by mouth 4 (four) times daily., Disp: , Rfl:  .  metoprolol succinate (TOPROL-XL) 25 MG 24 hr tablet, Take 1 tablet (25 mg total) by mouth daily., Disp: 30 tablet, Rfl: 12 .  montelukast (SINGULAIR) 10 MG tablet, Take 1 tablet (10 mg total) by mouth at bedtime., Disp: 30 tablet, Rfl: 5 .  Omega-3 Fatty Acids (FISH OIL) 1000 MG CAPS, Take by mouth., Disp: , Rfl:  .  OVER THE COUNTER MEDICATION, OREGA MAX, Disp: , Rfl:  .  OVER THE COUNTER MEDICATION, RED MARINE ALGAE-375 MG., Disp: ,  Rfl:  .  PROAIR HFA 108 (90 Base) MCG/ACT inhaler, Inhale 2 puffs into the lungs every 6 (six) hours as needed for wheezing., Disp: 18 g, Rfl: 2 .  sildenafil (REVATIO) 20 MG tablet, 3-5 tablets daily as needed for erectile dysfunction, Disp: , Rfl:  .  tamsulosin (FLOMAX) 0.4 MG CAPS capsule, Take 0.4 mg by mouth daily. , Disp: , Rfl:  .  testosterone cypionate (DEPOTESTOTERONE CYPIONATE) 100 MG/ML injection, Inject 100 mg into the muscle once a week. , Disp: , Rfl:  .  Glucosamine 500 MG CAPS, Take by mouth daily., Disp: , Rfl:  .  naproxen sodium (ANAPROX) 220 MG tablet, Take 220 mg by mouth 2 (two) times daily with a meal., Disp: ,  Rfl:  .  SODIUM CHLORIDE, EXTERNAL, ([facility] SALINE WOUND [place]) 0.9 % SOLN, Apply 2 application topically 2 (two) times daily., Disp: 210 mL, Rfl: 0  Review of Systems  Constitutional: Positive for chills, diaphoresis and fatigue. Negative for activity change, appetite change, fever and unexpected weight change.  HENT: Positive for congestion, nosebleeds, postnasal drip, rhinorrhea, sinus pressure and sinus pain. Negative for ear discharge, ear pain, hearing loss, sore throat, tinnitus, trouble swallowing and voice change.   Eyes: Positive for discharge and itching. Negative for photophobia, pain, redness and visual disturbance.  Respiratory: Positive for cough, chest tightness, shortness of breath and wheezing. Negative for apnea, choking and stridor.   Gastrointestinal: Negative.   Musculoskeletal: Positive for neck pain and neck stiffness.  Neurological: Negative for dizziness, light-headedness and headaches.  Hematological: Positive for adenopathy.    Social History   Tobacco Use  . Smoking status: Light Tobacco Smoker    Types: E-cigarettes, Cigars  . Smokeless tobacco: Never Used  . Tobacco comment: " sometimes during the day and at night"  Substance Use Topics  . Alcohol use: Yes    Alcohol/week: 0.0 oz    Comment: 12 pack a beer a night   Objective:   BP (!) 142/98 (BP Location: Right Arm, Patient Position: Sitting, Cuff Size: Large)   Pulse 96   Temp 97.7 F (36.5 C) (Oral)   Resp 20   Wt 225 lb (102.1 kg)   BMI 28.89 kg/m  Vitals:   04/13/18 1606  BP: (!) 142/98  Pulse: 96  Resp: 20  Temp: 97.7 F (36.5 C)  TempSrc: Oral  Weight: 225 lb (102.1 kg)     Physical Exam  Constitutional: He is oriented to person, place, and time. He appears well-developed and well-nourished. No distress.  HENT:  Right Ear: External ear normal.  Left Ear: External ear normal.  Nose: Right sinus exhibits maxillary sinus tenderness and frontal sinus tenderness. Left sinus exhibits  maxillary sinus tenderness and frontal sinus tenderness.  Mouth/Throat: Oropharynx is clear and moist and mucous membranes are normal. No oropharyngeal exudate, posterior oropharyngeal edema, posterior oropharyngeal erythema or tonsillar abscesses.  Eyes: Conjunctivae are normal.  Neck: Normal range of motion.  Cardiovascular: Normal rate and regular rhythm.  Pulmonary/Chest: Effort normal and breath sounds normal.  Neurological: He is alert and oriented to person, place, and time.  Skin: Skin is warm and dry. He is not diaphoretic.  Psychiatric: He has a normal mood and affect. His behavior is normal.        Assessment & Plan:     1. Acute sinusitis, recurrence not specified, unspecified location  - doxycycline (VIBRA-TABS) 100 MG tablet; Take 1 tablet (100 mg total) by mouth 2 (two) times daily for 7  days.  Dispense: 14 tablet; Refill: 0 - predniSONE (DELTASONE) 20 MG tablet; Take 1 tablet (20 mg total) by mouth daily with breakfast for 5 days.  Dispense: 5 tablet; Refill: 0  2. Wheezing  - predniSONE (DELTASONE) 20 MG tablet; Take 1 tablet (20 mg total) by mouth daily with breakfast for 5 days.  Dispense: 5 tablet; Refill: 0  Return if symptoms worsen or fail to improve.  The entirety of the information documented in the History of Present Illness, Review of Systems and Physical Exam were personally obtained by me. Portions of this information were initially documented by Boehme Royalty, CMA and reviewed by me for thoroughness and accuracy.        Trinna Post, PA-C  [facility]

## 2018-04-13 NOTE — Patient Instructions (Signed)

## 2018-04-14 ENCOUNTER — Ambulatory Visit: Payer: Self-pay | Admitting: Family Medicine

## 2018-04-14 ENCOUNTER — Telehealth: Payer: Self-pay

## 2018-04-14 DIAGNOSIS — R05 Cough: Secondary | ICD-10-CM

## 2018-04-14 DIAGNOSIS — R059 Cough, unspecified: Secondary | ICD-10-CM

## 2018-04-14 MED ORDER — BENZONATATE 100 MG PO CAPS: 100.0000 mg | ORAL_CAPSULE | Freq: Three times a day (TID) | ORAL | 0 refills | Status: AC | PRN

## 2018-04-14 NOTE — Telephone Encounter (Signed)
Advised patient as below.  

## 2018-04-14 NOTE — Telephone Encounter (Signed)
Patient called requesting a cough medication. He states he was recently seen in the office and was prescribed prednisone and Doxy. His cough has worsened and is keeping him up at night. He uses [name]. He has tried OTC Mucinex which is not helping.

## 2018-04-14 NOTE — Telephone Encounter (Signed)
Sent in tessalon perles

## 2018-04-23 ENCOUNTER — Other Ambulatory Visit: Payer: Self-pay | Admitting: Family Medicine

## 2018-05-30 ENCOUNTER — Ambulatory Visit: Payer: Self-pay | Admitting: Family Medicine

## 2018-06-07 ENCOUNTER — Telehealth: Payer: Self-pay | Admitting: Family Medicine

## 2018-06-07 NOTE — Telephone Encounter (Signed)
Dr Darnell Level I do not see anything that indicates that this was increased.  We only started giving it to him a few months ago and he did not keep his appointment last week.

## 2018-06-07 NOTE — Telephone Encounter (Signed)
Sam with [facility] stated that pt informed them that Dr. Rosanna Randy has changed pt's dose of gabapentin (NEURONTIN) 100 MG capsule from taking 1 capsule 3 times a day to 3 capsules 3 times a day. Sam stated if the Rx has been changed he would like an update Rx sent to [facility]. Please advise. Thanks TNP

## 2018-06-08 NOTE — Telephone Encounter (Signed)
Informed pharmacy that we do not have any record of the medicine being increased.  Advised that we could give him one for the dose we prescribed in April.  Also he would need to be seen as he missed his f/u appointment.  Pharmacy states they have 1 rf left.

## 2018-06-08 NOTE — Telephone Encounter (Signed)
Whatever he has been taking with 2 refills--needs f/u appt in next couple of months.

## 2018-06-09 ENCOUNTER — Other Ambulatory Visit: Payer: Self-pay | Admitting: Family Medicine

## 2018-06-09 MED ORDER — INDOMETHACIN 50 MG PO CAPS: 50.0000 mg | ORAL_CAPSULE | Freq: Three times a day (TID) | ORAL | 0 refills | Status: DC

## 2018-06-09 NOTE — Telephone Encounter (Signed)
done

## 2018-06-09 NOTE — Telephone Encounter (Signed)
TID prn,#90.,no rf.

## 2018-06-09 NOTE — Telephone Encounter (Signed)
Dr Marissa Nestle did not say if you wanted to do this or not.  It is not on his med list.

## 2018-06-09 NOTE — Telephone Encounter (Signed)
Pt contacted office for refill request on the following medications:  Indomethacin 50 mg  [facility]  Pt stated that Russell Simpson has prescribed this medication in the past for gout. Pt stated that he didn't need an OV the normally it's sent to the pharmacy without OV. Pt was advised that the last time Rx for medication was written was 12/22/2013 with 1 refill. Please advise. Thanks TNP

## 2018-06-16 ENCOUNTER — Ambulatory Visit: Payer: BLUE CROSS/BLUE SHIELD | Admitting: Family Medicine

## 2018-06-16 VITALS — BP 130/90 | HR 80 | Temp 98.2°F | Resp 16 | Wt 227.0 lb

## 2018-06-16 DIAGNOSIS — M5090 Cervical disc disorder, unspecified, unspecified cervical region: Secondary | ICD-10-CM | POA: Diagnosis not present

## 2018-06-16 DIAGNOSIS — F331 Major depressive disorder, recurrent, moderate: Secondary | ICD-10-CM

## 2018-06-16 DIAGNOSIS — E236 Other disorders of pituitary gland: Secondary | ICD-10-CM

## 2018-06-16 MED ORDER — GABAPENTIN 100 MG PO CAPS: 100.0000 mg | ORAL_CAPSULE | Freq: Three times a day (TID) | ORAL | 12 refills | Status: DC

## 2018-06-16 MED ORDER — CABERGOLINE 0.5 MG PO TABS: 0.2500 mg | ORAL_TABLET | ORAL | 0 refills | Status: DC

## 2018-06-16 NOTE — Progress Notes (Signed)
Russell Simpson  MRN: [id number] DOB: [birth year]  Subjective:  HPI   The patient is a 57 year old male who presents for follow up of his blood pressure.  He does not check the blood pressure outside of our office.  He was last seen for his blood pressure on 03/29/18.  On his visit prior to that he was instructed to add Amlodipine to his medications.  He denies any adverse effect and states he is compliant in taking his medications.   He continues to drink"3 regular beers and 3 NA beers daily."  The patient has also stated that he has been seeing Dr Edrick Oh, urologist.  He has been prescribing his Testosterone, Finesteride and Cabergoline.  Dr Jacqlyn Larsen has left his practice in [place] to start a new practice in [facility] and the patient does not have an appointment with his new Urologist, Dr Bernardo Heater until September.  He states he is only in need of his Cabergoline which will run out before seeing Dr Jacqlyn Larsen.  Patient Active Problem List   Diagnosis Date Noted  . Disorder of bursae of shoulder region 07/21/2017  . Full thickness rotator cuff tear 07/21/2017  . Localized, primary osteoarthritis 07/21/2017  . 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 Osteoarthritis of knee 07/21/2017  . Anxiety 01/10/2016  . BP (high blood pressure) 01/10/2016  . 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 Apnea, sleep 01/10/2016  . Hyperlipidemia 10/15/2015  . Pituitary adenoma ([place]) 10/15/2015  . 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 Depression 10/15/2015  . 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 BPH without obstruction/lower urinary tract symptoms 10/15/2015  . Allergic rhinitis 10/15/2015  . Incomplete bladder emptying 05/30/2015  . Hernia, inguinal, left 09/21/2014  . Benign prostatic hyperplasia with urinary obstruction 10/24/2012  . 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 OSTEOMYELITIS, ACUTE, OTHER [facility] SITE 06/09/2006    Past Medical History:  Diagnosis Date  . 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  Arthritis   . Asthma   . 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 Chronic pain    right arm  . Depression   . Environmental and seasonal allergies   . Pneumonia   . Sleep apnea    does not wear CPAP ; uses humidifier instead    Social History   Socioeconomic History  . Marital status: Married    Spouse name: Not on file  . Number of children: Not on file  . Years of education: Not on file  . Highest education level: Not on file  Occupational History  . Not on file  Social Needs  . Financial resource strain: Not on file  . Food insecurity:    Worry: Not on file    Inability: Not on file  . Transportation needs:    Medical: Not on file    Non-medical: Not on file  Tobacco Use  . Smoking status: Light Tobacco Smoker    Types: E-cigarettes, Cigars  . Smokeless tobacco: Never Used  . Tobacco comment: " sometimes during the day and at night"  Substance and Sexual Activity  . Alcohol use: Yes    Alcohol/week: 0.0 oz    Comment: 12 pack a beer a night  . Drug use: No  . Sexual activity: Not on file  Lifestyle  . Physical activity:    Days per week: Not on file    Minutes per session: Not on file  . Stress: Not on file  Relationships  .  Social connections:    Talks on phone: Not on file    Gets together: Not on file    Attends religious service: Not on file    Active member of club or organization: Not on file    Attends meetings of clubs or organizations: Not on file    Relationship status: Not on file  . Intimate partner violence:    Fear of current or ex partner: Not on file    Emotionally abused: Not on file    Physically abused: Not on file    Forced sexual activity: Not on file  Other Topics Concern  . Not on file  Social History Narrative  . Not on file    Outpatient Encounter Medications as of 06/16/2018  Medication Sig  . amLODipine (NORVASC) 5 MG tablet Take 1 tablet (5 mg total) by mouth daily.  . Arginine 2000 MG PACK Take by  mouth daily.  . cabergoline (DOSTINEX) 0.5 MG tablet Take 0.25 mg by mouth 2 (two) times a week.  Carin Hock Iron (PERFECT IRON) 25 MG TABS Take by mouth daily.  . clonazePAM (KLONOPIN) 0.5 MG tablet TAKE 1 TABLET BY MOUTH TWICE DAILY AS NEEDED ANXIETY/STRESS  . Coenzyme Q10 (COQ-10) 100 MG CAPS Take by mouth daily.  . Cyanocobalamin (B-12) 5000 MCG SUBL Place under the tongue daily.  [name] DHEA 50 MG CAPS Take by mouth.  . DULoxetine (CYMBALTA) 30 MG capsule Take 2 capsules once daily  . finasteride (PROSCAR) 5 MG tablet Take 5 mg by mouth daily.  [name] gabapentin (NEURONTIN) 100 MG capsule Take 1 capsule (100 mg total) by mouth 3 (three) times daily.  . Ginger, Zingiber officinalis, (GINGER PO) Take by mouth.  . Glucosamine 500 MG CAPS Take by mouth daily.  . indomethacin (INDOCIN) 50 MG capsule Take 1 capsule (50 mg total) by mouth 3 (three) times daily with meals.  [name] loratadine (CLARITIN) 10 MG tablet Take 10 mg by mouth daily.   . magnesium oxide (MAG-OX) 400 MG tablet Take 400 mg by mouth daily.  . metoprolol succinate (TOPROL-XL) 25 MG 24 hr tablet Take 1 tablet (25 mg total) by mouth daily.  . montelukast (SINGULAIR) 10 MG tablet Take 1 tablet (10 mg total) by mouth at bedtime.  . naproxen sodium (ANAPROX) 220 MG tablet Take 220 mg by mouth 2 (two) times daily with a meal.  . Omega-3 Fatty Acids (FISH OIL) 1000 MG CAPS Take by mouth.  [name] OVER THE COUNTER MEDICATION OREGA MAX  . OVER THE COUNTER MEDICATION RED MARINE ALGAE-375 MG.  Abe People HFA 108 (90 Base) MCG/ACT inhaler Inhale 2 puffs into the lungs every 6 (six) hours as needed for wheezing.  . sildenafil (REVATIO) 20 MG tablet 3-5 tablets daily as needed for erectile dysfunction  . testosterone cypionate (DEPOTESTOTERONE CYPIONATE) 100 MG/ML injection Inject 100 mg into the muscle once a week.   . [DISCONTINUED] methocarbamol (ROBAXIN) 500 MG tablet Take 500 mg by mouth 4 (four) times daily.  . [DISCONTINUED] SODIUM CHLORIDE, EXTERNAL,  ([facility]S SALINE WOUND [place]) 0.9 % SOLN Apply 2 application topically 2 (two) times daily.  . [DISCONTINUED] tamsulosin (FLOMAX) 0.4 MG CAPS capsule Take 0.4 mg by mouth daily.    No facility-administered encounter medications on file as of 06/16/2018.     Allergies  Allergen Reactions  . Penicillins Anaphylaxis  . Tetanus Toxoids Swelling  . Lisinopril     Other reaction(s): Other (See Comments) Other reaction(s): Cough Other reaction(s): Cough Other reaction(s): Cough  Other reaction(s): Cough  . Losartan     Other reaction(s): Other (See Comments) Other reaction(s): Muscle Pain Other reaction(s): Muscle Pain Other reaction(s): Muscle Pain  . Tetanus Toxoid   . Codeine Nausea Only    Review of Systems  Constitutional: Negative for fever and malaise/fatigue.  Eyes: Negative.   Respiratory: Negative for shortness of breath and wheezing.   Cardiovascular: Negative for chest pain, palpitations, orthopnea, claudication and leg swelling.  Gastrointestinal: Negative.   Skin: Negative.   Endo/Heme/Allergies: Negative.   Psychiatric/Behavioral: Negative.     Objective:  BP 130/90 (BP Location: Right Arm, Patient Position: Sitting, Cuff Size: Normal)   Pulse 80   Temp 98.2 F (36.8 C) (Oral)   Resp 16   Wt 227 lb (103 kg)   BMI 29.15 kg/m   Physical Exam  Constitutional: He is oriented to person, place, and time and well-developed, well-nourished, and in no distress.  HENT:  Head: Normocephalic and atraumatic.  Right Ear: External ear normal.  Left Ear: External ear normal.  Nose: Nose normal.  Eyes: Conjunctivae are normal. No scleral icterus.  Neck: No thyromegaly present.  Cardiovascular: Normal rate, regular rhythm and normal heart sounds.  Pulmonary/Chest: Effort normal and breath sounds normal.  Abdominal: Soft.  Musculoskeletal: He exhibits no edema.  Neurological: He is alert and oriented to person, place, and time. Gait normal. GCS score is 15.  Skin: Skin is warm  and dry.  Psychiatric: Mood, memory, affect and judgment normal.    Assessment and Plan :  1. Cervical disc disease Increase Gabapentin from 100to 300mg  q hs. RTC 4-6 months. - gabapentin (NEURONTIN) 100 MG capsule; Take 1 capsule (100 mg total) by mouth 3 (three) times daily.  Dispense: 90 capsule; Refill: 12 2.MDD/Alcoholism Pt improved.Encouraged to quit drinking completely. 3.pituitary adenoma  I have done the exam and reviewed the chart and it is accurate to the best of my knowledge. Development worker, community has been used and  any errors in dictation or transcription are unintentional. Miguel Aschoff M.D. [facility]

## 2018-07-07 ENCOUNTER — Other Ambulatory Visit: Payer: Self-pay | Admitting: Family Medicine

## 2018-07-08 ENCOUNTER — Other Ambulatory Visit: Payer: Self-pay | Admitting: Family Medicine

## 2018-07-08 ENCOUNTER — Telehealth: Payer: Self-pay | Admitting: Family Medicine

## 2018-07-08 MED ORDER — PROAIR HFA 108 (90 BASE) MCG/ACT IN AERS: 2.0000 | INHALATION_SPRAY | Freq: Four times a day (QID) | RESPIRATORY_TRACT | 2 refills | Status: DC | PRN

## 2018-07-08 NOTE — Telephone Encounter (Signed)
Please review. KW 

## 2018-07-08 NOTE — Telephone Encounter (Signed)
done

## 2018-07-08 NOTE — Telephone Encounter (Signed)
[place] faxed refill request for the following medications:  PROAIR HFA 108 (90 Base) MCG/ACT inhaler  Last Rx: 04/01/18 LOV: 06/16/18 Bob sent Rx on 04/01/18 since Dr. Rosanna Randy is out of the office this week can Mikki Santee review refill request? Please advise. Thanks TNP

## 2018-08-06 ENCOUNTER — Other Ambulatory Visit: Payer: Self-pay | Admitting: Family Medicine

## 2018-08-08 ENCOUNTER — Ambulatory Visit: Payer: BLUE CROSS/BLUE SHIELD | Admitting: Urology

## 2018-08-08 ENCOUNTER — Encounter: Payer: Self-pay | Admitting: Urology

## 2018-08-08 VITALS — BP 175/118 | HR 105 | Ht 74.0 in | Wt 229.0 lb

## 2018-08-08 DIAGNOSIS — E291 Testicular hypofunction: Secondary | ICD-10-CM

## 2018-08-09 LAB — TESTOSTERONE: TESTOSTERONE: 736 ng/dL (ref 264–916)

## 2018-08-09 LAB — HEMATOCRIT: HEMATOCRIT: 51.5 % — AB (ref 37.5–51.0)

## 2018-08-10 ENCOUNTER — Telehealth: Payer: Self-pay

## 2018-08-10 NOTE — Telephone Encounter (Signed)
-----   Message from Abbie Sons, MD sent at 08/10/2018  1:15 PM EDT ----- PSA level looks good at 736.  Hematocrit was slightly elevated at 51.5.  Recommend he donate blood as he is at increased risk of complications including stroke with a hematocrit at this level.

## 2018-08-10 NOTE — Telephone Encounter (Signed)
Patient notified

## 2018-08-10 NOTE — Progress Notes (Signed)
08/08/2018 2:47 PM   Russell Simpson 09/20/1961 [id number]  Referring provider: Jerrol Banana., MD [address] [place], [place] [address]  Chief Complaint  Patient presents with  . Establish Care  . Hypogonadism    HPI: 57 year old male presents to establish local urologic care.  He has been followed by Dr. 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  He was started on bromocriptine and subsequently switched to Cabergoline.  He remains on testosterone cypionate 100 mg weekly.  He uses sildenafil for erectile dysfunction.  He has no voiding complaints.  Denies dysuria or gross hematuria.  Denies flank, abdominal, pelvic or scrotal pain.  He last saw Dr. Jacqlyn Larsen at [facility] May 2019.  He is on finasteride/tamsulosin for lower urinary tract symptoms.  He states he has good energy level and libido.  Labs at that visit remarkable for a PSA which was stable at 1.81 (uncorrected), prolactin less than 1.4, testosterone 761 and a hemoglobin of 17.6.   PMH: Past Medical History:  Diagnosis Date  . 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 Arthritis   . Asthma   . Brain tumor (benign) ([place])    benign pituitary neoplasm  . Chronic pain    right arm  . Depression   . Environmental and seasonal allergies   . Pneumonia   . Sleep apnea    does not wear CPAP ; uses humidifier instead    Surgical History: Past Surgical History:  Procedure Laterality Date  . ANTERIOR CERVICAL DECOMP/DISCECTOMY FUSION N/A 06/17/2016   Procedure: ANTERIOR CERVICAL DECOMPRESSION FUSION, CERVICAL 3-4, CERVICAL 4-5 WITH INSTRUMENTATION AND ALLOGRAFT;  Surgeon: Phylliss Bob, MD;  Location: [facility];  Service: Orthopedics;  Laterality: N/A;  ANTERIOR CERVICAL DECOMPRESSION FUSION, CERVICAL 3-4, CERVICAL 4-5 WITH INSTRUMENTATION AND ALLOGRAFT  . BACK SURGERY      . NECK SURGERY  2009    Home Medications:  Allergies as of 08/08/2018      Reactions   Penicillins Anaphylaxis   Tetanus Toxoids Swelling   Lisinopril    Other reaction(s): Other (See Comments) Other reaction(s): Cough Other reaction(s): Cough Other reaction(s): Cough Other reaction(s): Cough   Losartan    Other reaction(s): Other (See Comments) Other reaction(s): Muscle Pain Other reaction(s): Muscle Pain Other reaction(s): Muscle Pain   Tetanus Toxoid    Codeine Nausea Only      Medication List        Accurate as of 08/08/18 11:59 PM. Always use your most recent med list.          amLODipine 5 MG tablet Commonly known as:  NORVASC Take 1 tablet (5 mg total) by mouth daily.   Arginine 2000 MG Pack Take by mouth daily.   B-12 5000 MCG Subl Place under the tongue daily.   cabergoline 0.5 MG tablet Commonly known as:  DOSTINEX Take 0.5 tablets (0.25 mg total) by mouth 2 (two) times a week.   clonazePAM 0.5 MG tablet Commonly known as:  KLONOPIN TAKE 1 TABLET BY MOUTH TWICE DAILY AS NEEDED ANXIETY/STRESS   CoQ-10 100 MG Caps Take by mouth daily.   DHEA 50 MG Caps Take by mouth.   DULoxetine 30 MG capsule Commonly known as:  CYMBALTA Take 2 capsules once daily   finasteride 5 MG tablet Commonly known as:  PROSCAR Take 5 mg by mouth daily.  Fish Oil 1000 MG Caps Take by mouth.   fluticasone 50 MCG/ACT nasal spray Commonly known as:  FLONASE Place into the nose.   gabapentin 100 MG capsule Commonly known as:  NEURONTIN Take 1 capsule (100 mg total) by mouth 3 (three) times daily.   GINGER PO Take by mouth.   Glucosamine 500 MG Caps Take by mouth daily.   hydrOXYzine 50 MG tablet Commonly known as:  ATARAX/VISTARIL hydroxyzine HCl 50 mg tablet   indomethacin 50 MG capsule Commonly known as:  INDOCIN TAKE 1 CAPSULE BY MOUTH 3 TIMES DAILY WITH MEALS   loratadine 10 MG tablet Commonly known as:  CLARITIN Take 10 mg by mouth daily.    magnesium oxide 400 MG tablet Commonly known as:  MAG-OX Take 400 mg by mouth daily.   meloxicam 15 MG tablet Commonly known as:  MOBIC   metoprolol succinate 25 MG 24 hr tablet Commonly known as:  TOPROL-XL Take 1 tablet (25 mg total) by mouth daily.   montelukast 10 MG tablet Commonly known as:  SINGULAIR Take 1 tablet (10 mg total) by mouth at bedtime.   naproxen sodium 220 MG tablet Commonly known as:  ALEVE Take 220 mg by mouth 2 (two) times daily with a meal.   omega-3 acid ethyl esters 1 g capsule Commonly known as:  LOVAZA Take by mouth.   OVER THE COUNTER MEDICATION OREGA MAX   OVER THE COUNTER MEDICATION RED MARINE ALGAE-375 MG.   PERFECT IRON 25 MG Tabs Generic drug:  Carbonyl Iron Take by mouth daily.   PROAIR HFA 108 (90 Base) MCG/ACT inhaler Generic drug:  albuterol Inhale 2 puffs into the lungs every 6 (six) hours as needed for wheezing.   sildenafil 20 MG tablet Commonly known as:  REVATIO 3-5 tablets daily as needed for erectile dysfunction   DEPO-TESTOSTERONE 200 MG/ML injection Generic drug:  testosterone cypionate 0.50ml weekly IM Weekly or as instructed. 22 Ga. 76ml syringes   testosterone cypionate 100 MG/ML injection Commonly known as:  DEPOTESTOTERONE CYPIONATE Inject 100 mg into the muscle once a week.   traZODone 100 MG tablet Commonly known as:  DESYREL trazodone 100 mg tablet   triamcinolone 0.025 % cream Commonly known as:  KENALOG       Allergies:  Allergies  Allergen Reactions  . Penicillins Anaphylaxis  . Tetanus Toxoids Swelling  . Lisinopril     Other reaction(s): Other (See Comments) Other reaction(s): Cough Other reaction(s): Cough Other reaction(s): Cough Other reaction(s): Cough  . Losartan     Other reaction(s): Other (See Comments) Other reaction(s): Muscle Pain Other reaction(s): Muscle Pain Other reaction(s): Muscle Pain  . Tetanus Toxoid   . Codeine Nausea Only    Family History: Family History   Problem Relation Age of Onset  . Diabetes Mother   . Diabetes Other     Social History:  reports that he has been smoking e-cigarettes and cigars. He has never used smokeless tobacco. He reports that he drinks alcohol. He reports that he does not use drugs.  ROS: UROLOGY Frequent Urination?: No Hard to postpone urination?: No Burning/pain with urination?: No Get up at night to urinate?: Yes Leakage of urine?: No Urine stream starts and stops?: No Trouble starting stream?: No Do you have to strain to urinate?: No Blood in urine?: No Urinary tract infection?: No Sexually transmitted disease?: No Injury to kidneys or bladder?: No Painful intercourse?: No Weak stream?: No Erection problems?: No Penile pain?: No  Gastrointestinal Nausea?: No Vomiting?:  No Indigestion/heartburn?: No Diarrhea?: No Constipation?: No  Constitutional Fever: No Night sweats?: No Weight loss?: No Fatigue?: Yes  Skin Skin rash/lesions?: No Itching?: No  Eyes Blurred vision?: No Double vision?: No  Ears/Nose/Throat Sore throat?: No Sinus problems?: No  Hematologic/Lymphatic Swollen glands?: No Easy bruising?: No  Cardiovascular Leg swelling?: No Chest pain?: No  Respiratory Cough?: No Shortness of breath?: No  Endocrine Excessive thirst?: No  Musculoskeletal Back pain?: Yes Joint pain?: Yes  Neurological Headaches?: No Dizziness?: No  Psychologic Depression?: No Anxiety?: Yes  Physical Exam: BP (!) 175/118 (BP Location: Left Arm, Patient Position: Sitting, Cuff Size: Large)   Pulse (!) 105   Ht 6\' 2"  (1.88 m)   Wt 229 lb (103.9 kg)   BMI 29.40 kg/m   Constitutional:  Alert and oriented, No acute distress. HEENT: [place] AT, moist mucus membranes.  Trachea midline, no masses. Cardiovascular: No clubbing, cyanosis, or edema. Respiratory: Normal respiratory effort, no increased work of breathing. GI: Abdomen is soft, nontender, nondistended, no abdominal  masses GU: No CVA tenderness.  Prostate 40 g, smooth without nodules Lymph: No cervical or inguinal lymphadenopathy. Skin: No rashes, bruises or suspicious lesions. Neurologic: Grossly intact, no focal deficits, moving all 4 extremities. Psychiatric: Normal mood and affect.   Assessment & Plan:   57 year old male with hypogonadotrophic hypogonadism.  Currently doing well on TRT.  Monitoring blood work was drawn including a testosterone level and hematocrit.  If stable will repeat blood work in 6 months and a office visit/exam in 1 year.  Return in about 1 year (around 08/09/2019) for Recheck.   Abbie Sons, [place] [address], [place],  [address] [phone]

## 2018-08-11 ENCOUNTER — Encounter: Payer: Self-pay | Admitting: Urology

## 2018-08-23 ENCOUNTER — Other Ambulatory Visit: Payer: Self-pay | Admitting: Family Medicine

## 2018-09-07 ENCOUNTER — Ambulatory Visit: Payer: BLUE CROSS/BLUE SHIELD | Admitting: Family Medicine

## 2018-09-07 VITALS — BP 138/92 | HR 99 | Temp 98.5°F | Resp 16 | Wt 238.0 lb

## 2018-09-07 DIAGNOSIS — E291 Testicular hypofunction: Secondary | ICD-10-CM | POA: Diagnosis not present

## 2018-09-07 DIAGNOSIS — R768 Other specified abnormal immunological findings in serum: Secondary | ICD-10-CM

## 2018-09-07 DIAGNOSIS — F331 Major depressive disorder, recurrent, moderate: Secondary | ICD-10-CM | POA: Diagnosis not present

## 2018-09-07 DIAGNOSIS — G4733 Obstructive sleep apnea (adult) (pediatric): Secondary | ICD-10-CM | POA: Diagnosis not present

## 2018-09-07 NOTE — Progress Notes (Addendum)
Russell Simpson  MRN: [id number] DOB: November 15, 1961  Subjective:  HPI   The patient is a 57 year old male who presents to discuss some labs that he had done for the [employer].  The patient had gone to donate blood and they sent him a packet of information stating that his Hep B Core Antibodies were reactive.  All other testing was negative.   The patient had Met C done last on 10/15/17 and at that time the liver enzymes were normal.   The patient had donated blood last year and said they may have sent the papers then but he would have probably thrown them out without looking at them as he almost did that with these papers.   Patient Active Problem List   Diagnosis Date Noted  . Disorder of bursae of shoulder region 07/21/2017  . Full thickness rotator cuff tear 07/21/2017  . Localized, primary osteoarthritis 07/21/2017  . 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 Osteoarthritis of knee 07/21/2017  . Anxiety 01/10/2016  . BP (high blood pressure) 01/10/2016  . Adenoma of pituitary ([place]) 01/10/2016  . Apnea, sleep 01/10/2016  . Hyperlipidemia 10/15/2015  . Pituitary adenoma ([place]) 10/15/2015  . 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 Depression 10/15/2015  . 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 BPH without obstruction/lower urinary tract symptoms 10/15/2015  . Allergic rhinitis 10/15/2015  . Incomplete bladder emptying 05/30/2015  . Hernia, inguinal, left 09/21/2014  . Benign prostatic hyperplasia with urinary obstruction 10/24/2012  . 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 OSTEOMYELITIS, ACUTE, OTHER [facility] SITE 06/09/2006    Past Medical History:  Diagnosis Date  . 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 Arthritis   . Asthma   . Brain tumor (benign) ([place])    benign pituitary neoplasm  . Chronic pain    right arm  . Depression   . Environmental and seasonal allergies   . Pneumonia   . Sleep apnea    does not wear CPAP ; uses humidifier instead    Social History    Socioeconomic History  . Marital status: Married    Spouse name: Not on file  . Number of children: Not on file  . Years of education: Not on file  . Highest education level: Not on file  Occupational History  . Not on file  Social Needs  . Financial resource strain: Not on file  . Food insecurity:    Worry: Not on file    Inability: Not on file  . Transportation needs:    Medical: Not on file    Non-medical: Not on file  Tobacco Use  . Smoking status: Light Tobacco Smoker    Types: E-cigarettes, Cigars  . Smokeless tobacco: Never Used  . Tobacco comment: " sometimes during the day and at night"  Substance and Sexual Activity  . Alcohol use: Yes    Alcohol/week: 0.0 standard drinks    Comment: 12 pack a beer a night  . Drug use: No  . Sexual activity: Yes  Lifestyle  . Physical activity:    Days per week: Not on file    Minutes per session: Not on file  . Stress: Not on file  Relationships  . Social connections:    Talks on phone: Not on file    Gets together: Not on file    Attends religious service: Not on file    Active member of club or organization: Not on file    Attends meetings of clubs  or organizations: Not on file    Relationship status: Not on file  . Intimate partner violence:    Fear of current or ex partner: Not on file    Emotionally abused: Not on file    Physically abused: Not on file    Forced sexual activity: Not on file  Other Topics Concern  . Not on file  Social History Narrative  . Not on file    Outpatient Encounter Medications as of 09/07/2018  Medication Sig  . amLODipine (NORVASC) 5 MG tablet Take 1 tablet (5 mg total) by mouth daily.  . Arginine 2000 MG PACK Take by mouth daily.  . cabergoline (DOSTINEX) 0.5 MG tablet TAKE 1/2 TABLET BY MOUTH 2 TIMES A WEEK  . Carbonyl Iron (PERFECT IRON) 25 MG TABS Take by mouth daily.  . clonazePAM (KLONOPIN) 0.5 MG tablet TAKE 1 TABLET BY MOUTH TWICE DAILY AS NEEDED ANXIETY/STRESS  . Coenzyme  Q10 (COQ-10) 100 MG CAPS Take by mouth daily.  . Cyanocobalamin (B-12) 5000 MCG SUBL Place under the tongue daily.  [name] DHEA 50 MG CAPS Take by mouth.  . DULoxetine (CYMBALTA) 30 MG capsule Take 2 capsules once daily  . finasteride (PROSCAR) 5 MG tablet Take 5 mg by mouth daily.  . fluticasone (FLONASE) 50 MCG/ACT nasal spray Place into the nose.  . Ginger, Zingiber officinalis, (GINGER PO) Take by mouth.  . Glucosamine 500 MG CAPS Take by mouth daily.  . hydrOXYzine (ATARAX/VISTARIL) 50 MG tablet hydroxyzine HCl 50 mg tablet  . indomethacin (INDOCIN) 50 MG capsule TAKE 1 CAPSULE BY MOUTH 3 TIMES DAILY WITH MEALS  . loratadine (CLARITIN) 10 MG tablet Take 10 mg by mouth daily.   . magnesium oxide (MAG-OX) 400 MG tablet Take 400 mg by mouth daily.  . metoprolol succinate (TOPROL-XL) 25 MG 24 hr tablet Take 1 tablet (25 mg total) by mouth daily.  . montelukast (SINGULAIR) 10 MG tablet Take 1 tablet (10 mg total) by mouth at bedtime.  . naproxen sodium (ANAPROX) 220 MG tablet Take 220 mg by mouth 2 (two) times daily with a meal.  . omega-3 acid ethyl esters (LOVAZA) 1 g capsule Take by mouth.  . Omega-3 Fatty Acids (FISH OIL) 1000 MG CAPS Take by mouth.  [name] OVER THE COUNTER MEDICATION OREGA MAX  . OVER THE COUNTER MEDICATION RED MARINE ALGAE-375 MG.  Abe People HFA 108 (90 Base) MCG/ACT inhaler Inhale 2 puffs into the lungs every 6 (six) hours as needed for wheezing.  . sildenafil (REVATIO) 20 MG tablet 3-5 tablets daily as needed for erectile dysfunction  . testosterone cypionate (DEPO-TESTOSTERONE) 200 MG/ML injection 0.38m weekly IM Weekly or as instructed. 257Ga. 310msyringes  . testosterone cypionate (DEPOTESTOTERONE CYPIONATE) 100 MG/ML injection Inject 100 mg into the muscle once a week.   . traZODone (DESYREL) 100 MG tablet trazodone 100 mg tablet  . triamcinolone (KENALOG) 0.025 % cream   . [DISCONTINUED] gabapentin (NEURONTIN) 100 MG capsule Take 1 capsule (100 mg total) by mouth 3 (three)  times daily.  . [DISCONTINUED] meloxicam (MOBIC) 15 MG tablet    No facility-administered encounter medications on file as of 09/07/2018.     Allergies  Allergen Reactions  . Penicillins Anaphylaxis  . Tetanus Toxoids Swelling  . Lisinopril     Other reaction(s): Other (See Comments) Other reaction(s): Cough Other reaction(s): Cough Other reaction(s): Cough Other reaction(s): Cough  . Losartan     Other reaction(s): Other (See Comments) Other reaction(s): Muscle Pain Other reaction(s):  Muscle Pain Other reaction(s): Muscle Pain  . Tetanus Toxoid   . Codeine Nausea Only    Review of Systems  Constitutional: Positive for malaise/fatigue. Negative for fever.  Eyes: Negative.   Respiratory: Positive for cough. Negative for shortness of breath and wheezing.   Cardiovascular: Negative for chest pain, palpitations, orthopnea, claudication and leg swelling.  Gastrointestinal: Negative.   Skin: Negative.   Endo/Heme/Allergies: Negative.   Psychiatric/Behavioral: Negative.     Objective:  BP (!) 138/92 (BP Location: Right Arm, Patient Position: Sitting, Cuff Size: Normal)   Pulse 99   Temp 98.5 F (36.9 C) (Oral)   Resp 16   Wt 238 lb (108 kg)   BMI 30.56 kg/m   Physical Exam  Constitutional: He is oriented to person, place, and time and well-developed, well-nourished, and in no distress.  HENT:  Head: Normocephalic and atraumatic.  Right Ear: External ear normal.  Left Ear: External ear normal.  Nose: Nose normal.  Neck: No thyromegaly present.  Cardiovascular: Normal rate, regular rhythm and normal heart sounds.  Pulmonary/Chest: Effort normal and breath sounds normal.  Abdominal: Soft.  Musculoskeletal: He exhibits no edema.  Neurological: He is alert and oriented to person, place, and time. Gait normal. GCS score is 15.  Skin: Skin is warm and dry.  Psychiatric: Mood, memory, affect and judgment normal.    Assessment and Plan :   1. Hepatitis B core antibody  positive  - Hepatitis A antibody, IgM - Hepatitis B Core Antibody, IgM - Hepatitis B Surface AntiBODY - Hepatitis B Surface AntiGEN - Hepatitis C antibody  2. Obstructive sleep apnea syndrome   3. Testicular hypofunction   4. Moderate episode of recurrent major depressive disorder (HCC) Rezulti Helps.  I have done the exam and reviewed the chart and it is accurate to the best of my knowledge. Development worker, community has been used and  any errors in dictation or transcription are unintentional. Miguel Aschoff M.D. [facility]

## 2018-09-08 LAB — HEPATITIS B SURFACE ANTIGEN: Hepatitis B Surface Ag: NEGATIVE

## 2018-09-08 LAB — HEPATITIS C ANTIBODY: Hep C Virus Ab: 0.2 s/co ratio (ref 0.0–0.9)

## 2018-09-08 LAB — HEPATITIS A ANTIBODY, IGM: HEP A IGM: NEGATIVE

## 2018-09-08 LAB — HEPATITIS B CORE ANTIBODY, IGM: Hep B C IgM: NEGATIVE

## 2018-09-08 LAB — HEPATITIS B SURFACE ANTIBODY,QUALITATIVE: Hep B Surface Ab, Qual: REACTIVE

## 2018-09-12 ENCOUNTER — Telehealth: Payer: Self-pay | Admitting: Family Medicine

## 2018-09-12 NOTE — Telephone Encounter (Signed)
Pt would like his lab results from last week  Thanks  teri

## 2018-09-12 NOTE — Telephone Encounter (Signed)
LMTCB

## 2018-09-13 NOTE — Telephone Encounter (Signed)
Pt returned call  Please call [phone]  [facility]

## 2018-09-14 NOTE — Telephone Encounter (Signed)
Please review and sign labs.  Patient ahs called twice

## 2018-09-14 NOTE — Telephone Encounter (Signed)
Awaiting Dr. Alben Spittle review. Patient is aware that we will call once review is completed.

## 2018-09-20 NOTE — Telephone Encounter (Signed)
Pt advised.   Thanks,   -Gawain Crombie  

## 2018-09-28 ENCOUNTER — Telehealth: Payer: Self-pay | Admitting: Family Medicine

## 2018-09-28 NOTE — Telephone Encounter (Signed)
Please review. Thanks!  

## 2018-09-28 NOTE — Telephone Encounter (Signed)
Pt was prescribed Rexulti 1mg  from [facility] and is about to run out.  Stats this helps with the Cymbolta.   [facility] is suppose to be helping with getting this for him, but nothing is being done.  Cost $1400 for 2 weeks supply.  Pt asking if Dr. Rosanna Randy can assist with getting him this medication.  Please advise.  Thanks, [employer]

## 2018-09-28 NOTE — Telephone Encounter (Signed)
Can we consult CCM to see if they can help?

## 2018-09-29 ENCOUNTER — Ambulatory Visit: Payer: Self-pay | Admitting: Pharmacist

## 2018-09-29 NOTE — Telephone Encounter (Signed)
I will see what I can do- I will reach out to him via telephone tomorrow! Thanks!   Ruben Reason, PharmD Clinical Pharmacist [facility] [phone]

## 2018-09-29 NOTE — Chronic Care Management (AMB) (Signed)
  Care Management   Note  09/29/2018 Name: Russell Simpson MRN: [id number] DOB: [birth year]  Referral Source: Dr. Rosanna Randy Referral reason: [place] coordination  Spoke with pharmacist at [facility]. Pharmacist states that [place] requires a prior authorization and this notification had been sent to prescriber (Dr. Sherlynn Stalls).   Spoke with Dr. Norman Herrlich CMA. She states that she has submitted the prior authorization paper work several times and it is always denied by insurance as "not covered". Asked CMA to please leave a message for Dr. Sherlynn Stalls requesting an alternative to [place], such as aripiprazole, to be considered. Asked CMA to please contact patient to update him on the status of this medication.   I did investigate for Dr. Rosanna Randy if patient could use a Rexulti coupon, but unfortunately if Rexulti is not covered on his insurance, the terms and conditions of the coupon prohibit him from using it.    Ruben Reason, PharmD Clinical Pharmacist [facility] [phone]

## 2018-09-29 NOTE — Telephone Encounter (Signed)
Russell Gauss, do you think you could help this patient?

## 2018-10-06 ENCOUNTER — Telehealth: Payer: Self-pay

## 2018-10-06 NOTE — Telephone Encounter (Signed)
Patient requesting a prescription for rexulti 0.5 mg One tablet daily. Patient reports that he has been getting samples from Dr. Andree Elk at [facility]. Patient reports that they have run out of samples.

## 2018-10-07 NOTE — Telephone Encounter (Signed)
Do you think he should get the prescription from [facility] since they are the ones prescribing it?

## 2018-10-08 NOTE — Telephone Encounter (Signed)
yes

## 2018-10-10 NOTE — Telephone Encounter (Signed)
LMTCB ED 

## 2018-10-11 ENCOUNTER — Other Ambulatory Visit: Payer: Self-pay | Admitting: Family Medicine

## 2018-10-11 NOTE — Telephone Encounter (Signed)
See refill request.

## 2018-10-12 NOTE — Telephone Encounter (Signed)
LMTCB ED 

## 2018-10-17 NOTE — Telephone Encounter (Signed)
Patient advised.

## 2018-11-10 ENCOUNTER — Other Ambulatory Visit: Payer: Self-pay | Admitting: Urology

## 2018-11-10 NOTE — Telephone Encounter (Signed)
Pt needs a refill for Testosterone and Viagra sent to [facility].  [phone]

## 2018-11-10 NOTE — Telephone Encounter (Signed)
Ok to refill 

## 2018-11-11 ENCOUNTER — Other Ambulatory Visit: Payer: Self-pay | Admitting: Family Medicine

## 2018-11-13 MED ORDER — TESTOSTERONE CYPIONATE 100 MG/ML IM SOLN: 100.0000 mg | INTRAMUSCULAR | 1 refills | Status: DC

## 2018-11-13 MED ORDER — SILDENAFIL CITRATE 20 MG PO TABS: ORAL_TABLET | ORAL | 3 refills | Status: DC

## 2018-11-28 ENCOUNTER — Other Ambulatory Visit: Payer: Self-pay | Admitting: Urology

## 2018-11-28 DIAGNOSIS — N400 Enlarged prostate without lower urinary tract symptoms: Secondary | ICD-10-CM

## 2018-11-28 MED ORDER — TAMSULOSIN HCL 0.4 MG PO CAPS: 0.4000 mg | ORAL_CAPSULE | Freq: Every day | ORAL | 3 refills | Status: DC

## 2018-11-28 NOTE — Telephone Encounter (Signed)
Called pt informed him that rx was sent in.

## 2018-11-28 NOTE — Telephone Encounter (Signed)
Pt saw [facility] 9/19.  He needs a refill for Flomax which was prescribed by [facility] sent to [name].  Please call pt at [phone] and let him know when he can pick this up.  He is out of meds.

## 2018-12-02 ENCOUNTER — Ambulatory Visit (INDEPENDENT_AMBULATORY_CARE_PROVIDER_SITE_OTHER): Payer: PRIVATE HEALTH INSURANCE | Admitting: Family Medicine

## 2018-12-02 ENCOUNTER — Other Ambulatory Visit: Payer: Self-pay | Admitting: Family Medicine

## 2018-12-02 ENCOUNTER — Ambulatory Visit
Admission: RE | Admit: 2018-12-02 | Discharge: 2018-12-02 | Disposition: A | Discharge status: Home / Self Care | Payer: PRIVATE HEALTH INSURANCE | Attending: Family Medicine | Admitting: Family Medicine

## 2018-12-02 ENCOUNTER — Ambulatory Visit
Admission: RE | Admit: 2018-12-02 | Discharge: 2018-12-02 | Disposition: A | Discharge status: Home / Self Care | Payer: PRIVATE HEALTH INSURANCE | Source: Ambulatory Visit | Attending: Family Medicine | Admitting: Family Medicine

## 2018-12-02 ENCOUNTER — Encounter: Payer: Self-pay | Admitting: Family Medicine

## 2018-12-02 VITALS — BP 120/84 | HR 83 | Temp 98.3°F | Resp 16 | Wt 234.0 lb

## 2018-12-02 DIAGNOSIS — G479 Sleep disorder, unspecified: Secondary | ICD-10-CM

## 2018-12-02 DIAGNOSIS — M79674 Pain in right toe(s): Secondary | ICD-10-CM | POA: Insufficient documentation

## 2018-12-02 DIAGNOSIS — D352 Benign neoplasm of pituitary gland: Secondary | ICD-10-CM

## 2018-12-02 MED ORDER — FINASTERIDE 5 MG PO TABS: 5.0000 mg | ORAL_TABLET | Freq: Every day | ORAL | 3 refills | Status: DC

## 2018-12-02 NOTE — Progress Notes (Signed)
Patient: Russell Simpson Male    DOB: 1961/11/25   58 y.o.   MRN: [id number] Visit Date: 12/02/2018  Today's Provider: Vernie Murders, PA   Chief Complaint  Patient presents with  . Toe Pain   Subjective:     Toe Pain   The incident occurred 2 days ago. There was no injury mechanism. Pain location: right big toe. The quality of the pain is described as stabbing. The pain is at a severity of 8/10. The pain is severe. The pain has been intermittent since onset. Associated symptoms include an inability to bear weight. Pertinent negatives include no loss of motion, loss of sensation, muscle weakness, numbness or tingling. He reports no foreign bodies present. The symptoms are aggravated by movement, palpation and weight bearing. He has tried NSAIDs and ice for the symptoms. The treatment provided no relief.   Patient has had pain and swelling in his right big toe for 2 days. Patient states pain is worse when he is putting weight on his foot. Also patient states his toe is painful to the touch. Patient has been taking otc Aleve and using ice with no relief.   Past Medical History:  Diagnosis Date  . 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 Arthritis   . Asthma   . 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 Chronic pain    right arm  . Depression   . Environmental and seasonal allergies   . Pneumonia   . Sleep apnea    does not wear CPAP ; uses humidifier instead   Past Surgical History:  Procedure Laterality Date  . ANTERIOR CERVICAL DECOMP/DISCECTOMY FUSION N/A 06/17/2016   Procedure: ANTERIOR CERVICAL DECOMPRESSION FUSION, CERVICAL 3-4, CERVICAL 4-5 WITH INSTRUMENTATION AND ALLOGRAFT;  Surgeon: Phylliss Bob, MD;  Location: [facility];  Service: Orthopedics;  Laterality: N/A;  ANTERIOR CERVICAL DECOMPRESSION FUSION, CERVICAL 3-4, CERVICAL 4-5 WITH INSTRUMENTATION AND ALLOGRAFT  . BACK SURGERY    . NECK SURGERY  2009   Family History  Problem Relation Age of Onset    . Diabetes Mother   . Diabetes Other    Allergies  Allergen Reactions  . Penicillins Anaphylaxis  . Tetanus Toxoids Swelling  . Lisinopril     Other reaction(s): Other (See Comments) Other reaction(s): Cough Other reaction(s): Cough Other reaction(s): Cough Other reaction(s): Cough  . Losartan     Other reaction(s): Other (See Comments) Other reaction(s): Muscle Pain Other reaction(s): Muscle Pain Other reaction(s): Muscle Pain  . Tetanus Toxoid   . Codeine Nausea Only    Current Outpatient Medications:  .  amLODipine (NORVASC) 5 MG tablet, Take 1 tablet (5 mg total) by mouth daily., Disp: 90 tablet, Rfl: 3 .  Arginine 2000 MG PACK, Take by mouth daily., Disp: , Rfl:  .  cabergoline (DOSTINEX) 0.5 MG tablet, TAKE 0.5 TABLETS BY MOUTH TWICE A WEEK, Disp: 10 tablet, Rfl: 2 .  Carbonyl Iron (PERFECT IRON) 25 MG TABS, Take by mouth daily., Disp: , Rfl:  .  clonazePAM (KLONOPIN) 0.5 MG tablet, TAKE 1 TABLET BY MOUTH TWICE DAILY AS NEEDED ANXIETY/STRESS, Disp: 60 tablet, Rfl: 4 .  Coenzyme Q10 (COQ-10) 100 MG CAPS, Take by mouth daily., Disp: , Rfl:  .  Cyanocobalamin (B-12) 5000 MCG SUBL, Place under the tongue daily., Disp: , Rfl:  .  DHEA 50 MG CAPS, Take by mouth., Disp: , Rfl:  .  DULoxetine (CYMBALTA) 30 MG capsule, Take 2 capsules  once daily, Disp: 60 capsule, Rfl: 5 .  finasteride (PROSCAR) 5 MG tablet, Take 1 tablet (5 mg total) by mouth daily., Disp: 90 tablet, Rfl: 3 .  Ginger, Zingiber officinalis, (GINGER PO), Take by mouth., Disp: , Rfl:  .  Glucosamine 500 MG CAPS, Take by mouth daily., Disp: , Rfl:  .  hydrOXYzine (ATARAX/VISTARIL) 50 MG tablet, hydroxyzine HCl 50 mg tablet, Disp: , Rfl:  .  indomethacin (INDOCIN) 50 MG capsule, TAKE 1 CAPSULE BY MOUTH 3 TIMES DAILY WITH MEALS, Disp: 90 capsule, Rfl: 1 .  loratadine (CLARITIN) 10 MG tablet, Take 10 mg by mouth daily. , Disp: , Rfl:  .  magnesium oxide (MAG-OX) 400 MG tablet, Take 400 mg by mouth daily., Disp: , Rfl:   .  metoprolol succinate (TOPROL-XL) 25 MG 24 hr tablet, Take 1 tablet (25 mg total) by mouth daily., Disp: 30 tablet, Rfl: 12 .  montelukast (SINGULAIR) 10 MG tablet, Take 1 tablet (10 mg total) by mouth at bedtime., Disp: 30 tablet, Rfl: 5 .  naproxen sodium (ANAPROX) 220 MG tablet, Take 220 mg by mouth 2 (two) times daily with a meal., Disp: , Rfl:  .  Omega-3 Fatty Acids (FISH OIL) 1000 MG CAPS, Take by mouth., Disp: , Rfl:  .  OVER THE COUNTER MEDICATION, OREGA MAX, Disp: , Rfl:  .  OVER THE COUNTER MEDICATION, RED MARINE ALGAE-375 MG., Disp: , Rfl:  .  PROAIR HFA 108 (90 Base) MCG/ACT inhaler, INHALE 2 PUFFS BY MOUTH EVERY 6 HOURS ASNEEDED WHEEZING, Disp: 8.5 g, Rfl: 11 .  sildenafil (REVATIO) 20 MG tablet, 3-5 tablets daily as needed for erectile dysfunction, Disp: 30 tablet, Rfl: 3 .  tamsulosin (FLOMAX) 0.4 MG CAPS capsule, Take 1 capsule (0.4 mg total) by mouth daily., Disp: 90 capsule, Rfl: 3 .  testosterone cypionate (DEPO-TESTOSTERONE) 200 MG/ML injection, 0.9ml weekly IM Weekly or as instructed. 52 Ga. 59ml syringes, Disp: , Rfl:  .  testosterone cypionate (DEPOTESTOTERONE CYPIONATE) 100 MG/ML injection, Inject 1 mL (100 mg total) into the muscle once a week., Disp: 10 mL, Rfl: 1 .  fluticasone (FLONASE) 50 MCG/ACT nasal spray, Place into the nose., Disp: , Rfl:  .  omega-3 acid ethyl esters (LOVAZA) 1 g capsule, Take by mouth., Disp: , Rfl:  .  traZODone (DESYREL) 100 MG tablet, trazodone 100 mg tablet, Disp: , Rfl:  .  triamcinolone (KENALOG) 0.025 % cream, , Disp: , Rfl:   Review of Systems  Neurological: Negative for tingling and numbness.   Social History   Tobacco Use  . Smoking status: Light Tobacco Smoker    Types: E-cigarettes, Cigars  . Smokeless tobacco: Never Used  . Tobacco comment: " sometimes during the day and at night"  Substance Use Topics  . Alcohol use: Yes    Alcohol/week: 0.0 standard drinks    Comment: 12 pack a beer a night     Objective:   BP  120/84 (BP Location: Right Arm, Patient Position: Sitting, Cuff Size: Large)   Pulse 83   Temp 98.3 F (36.8 C) (Oral)   Resp 16   Wt 234 lb (106.1 kg)   SpO2 96%   BMI 30.04 kg/m  Vitals:   12/02/18 1055  BP: 120/84  Pulse: 83  Resp: 16  Temp: 98.3 F (36.8 C)  TempSrc: Oral  SpO2: 96%  Weight: 234 lb (106.1 kg)   Physical Exam Constitutional:      General: He is not in acute distress.  Appearance: He is well-developed.  HENT:     Head: Normocephalic and atraumatic.     Right Ear: Hearing normal.     Left Ear: Hearing normal.     Nose: Nose normal.  Eyes:     General: Lids are normal. No scleral icterus.       Right eye: No discharge.        Left eye: No discharge.     Conjunctiva/sclera: Conjunctivae normal.  Pulmonary:     Effort: Pulmonary effort is normal. No respiratory distress.  Musculoskeletal: Normal range of motion.        General: Swelling and tenderness present.     Comments: Redness and tenderness of the right great toe - worse over the DIP joint. Good pulses without sores or ulcers.  Skin:    Findings: No lesion or rash.  Neurological:     Mental Status: He is alert and oriented to person, place, and time.  Psychiatric:        Speech: Speech normal.        Behavior: Behavior normal.        Thought Content: Thought content normal.       Assessment & Plan    1. Great toe pain, right No known injury. History of some gout in the past and uses Indomethacin occasionally when toe red or painful. Onset over the past 2-3 days. Will get x-ray evaluation since he remembers dropping a brush on it in the shower 4-5 days ago. Will check labs and advised to restart Indomethacin 50 mg TID for a few days. Recheck pending reports. - DG Toe Great Right - Uric acid - Sedimentation rate - CBC with Differential/Platelet  2. 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 Has a history of benign pituitary neoplasm diagnosed in 2011. Will check  CBC and prolactin level. - CBC with Differential/Platelet - Prolactin  3. Sleep disturbance Having some difficulty initiating sleep. Has been off the Trazodone (that helped in the past) but still using the Cymbalta. Has stopped all ETOH intake for the past 24 days and feeling better. Expect sleep pattern to improve the longer he is sober. Don't feel comfortable with him using Trazodone with the Cymbalta at the present because of possible serotonin syndrome risk. May consider using Melatonin 3-5 mg hs.     Vernie Murders, PA  [facility]

## 2018-12-03 LAB — CBC WITH DIFFERENTIAL/PLATELET
Basophils Absolute: 0.1 10*3/uL (ref 0.0–0.2)
Basos: 1 %
EOS (ABSOLUTE): 0.1 10*3/uL (ref 0.0–0.4)
EOS: 2 %
HEMOGLOBIN: 18.1 g/dL — AB (ref 13.0–17.7)
Hematocrit: 50.5 % (ref 37.5–51.0)
Immature Grans (Abs): 0 10*3/uL (ref 0.0–0.1)
Immature Granulocytes: 0 %
LYMPHS: 17 %
Lymphocytes Absolute: 1.4 10*3/uL (ref 0.7–3.1)
MCH: 33.5 pg — ABNORMAL HIGH (ref 26.6–33.0)
MCHC: 35.8 g/dL — ABNORMAL HIGH (ref 31.5–35.7)
MCV: 94 fL (ref 79–97)
Monocytes Absolute: 0.9 10*3/uL (ref 0.1–0.9)
Monocytes: 12 %
Neutrophils Absolute: 5.5 10*3/uL (ref 1.4–7.0)
Neutrophils: 68 %
Platelets: 223 10*3/uL (ref 150–450)
RBC: 5.4 x10E6/uL (ref 4.14–5.80)
RDW: 11.8 % — ABNORMAL LOW (ref 12.3–15.4)
WBC: 7.9 10*3/uL (ref 3.4–10.8)

## 2018-12-03 LAB — PROLACTIN: Prolactin: 1.3 ng/mL — ABNORMAL LOW (ref 4.0–15.2)

## 2018-12-03 LAB — SEDIMENTATION RATE: Sed Rate: 15 mm/hr (ref 0–30)

## 2018-12-03 LAB — URIC ACID: Uric Acid: 6.1 mg/dL (ref 3.7–8.6)

## 2018-12-05 ENCOUNTER — Telehealth: Payer: Self-pay

## 2018-12-05 ENCOUNTER — Other Ambulatory Visit: Payer: Self-pay

## 2018-12-05 MED ORDER — FINASTERIDE 5 MG PO TABS: 5.0000 mg | ORAL_TABLET | Freq: Every day | ORAL | 3 refills | Status: DC

## 2018-12-05 NOTE — Telephone Encounter (Signed)
Please review

## 2018-12-05 NOTE — Telephone Encounter (Signed)
Patient is calling office to inquire about recent lab results, he request call back on his cell phone. KW

## 2018-12-05 NOTE — Telephone Encounter (Signed)
Patient advised as directed below. 

## 2018-12-05 NOTE — Telephone Encounter (Signed)
-----   Message from [place], [place] sent at 12/05/2018 12:19 PM EST ----- Normal uric acid level and blood counts that do not indicate any infection. Sedimentation rate is not elevated (as would be expected with inflammation or infection). The small spurs are usually the signs of arthritis. May need evaluation by a rheumatologist if no better after using the Indomethacin TID for a week.

## 2018-12-05 NOTE — Telephone Encounter (Signed)
See lab result note.

## 2018-12-20 ENCOUNTER — Other Ambulatory Visit: Payer: Self-pay | Admitting: Family Medicine

## 2018-12-20 MED ORDER — MONTELUKAST SODIUM 10 MG PO TABS: 10.0000 mg | ORAL_TABLET | Freq: Every day | ORAL | 5 refills | Status: DC

## 2018-12-20 NOTE — Telephone Encounter (Signed)
[facility] faxed refill request for the following medications:  montelukast (SINGULAIR) 10 MG tablet  Last Rx: 08/03/17 Please advise. Thanks TNP

## 2018-12-21 ENCOUNTER — Ambulatory Visit (INDEPENDENT_AMBULATORY_CARE_PROVIDER_SITE_OTHER): Payer: PRIVATE HEALTH INSURANCE | Admitting: Family Medicine

## 2018-12-21 ENCOUNTER — Encounter: Payer: Self-pay | Admitting: Family Medicine

## 2018-12-21 VITALS — BP 110/82 | HR 82 | Temp 98.4°F | Resp 16 | Wt 228.4 lb

## 2018-12-21 DIAGNOSIS — F419 Anxiety disorder, unspecified: Secondary | ICD-10-CM

## 2018-12-21 MED ORDER — HYDROXYZINE HCL 25 MG PO TABS: 25.0000 mg | ORAL_TABLET | Freq: Three times a day (TID) | ORAL | 0 refills | Status: DC | PRN

## 2018-12-21 NOTE — Progress Notes (Signed)
  Subjective:     Patient ID: Russell Simpson, male   DOB: 1961-04-15, 58 y.o.   MRN: [id number] Chief Complaint  Patient presents with  . Follow-up    Patient comes in office today to discuss change in medications to treat for Depression/Anxiety. Patient states that last year he had lost his job and turned to alcohol, patient states that he decided to quit and seek help through [facility]. Patient states today marks 43 days that he has been sober, he counselor suggested he speak with physcian about changing clonazepam because it is a opiod drug that can be addicting.    HPI Currently using clonazepam only once daily. Wishes to try hydroxyzine. He also participates in AA and has a sponsor and a supportive girlfriend.  Review of Systems     Objective:   Physical Exam Constitutional:      General: He is not in acute distress. Neurological:     Mental Status: He is alert.        Assessment:    1. Anxiety - hydrOXYzine (ATARAX/VISTARIL) 25 MG tablet; Take 1 tablet (25 mg total) by mouth 3 (three) times daily as needed.  Dispense: 30 tablet; Refill: 0    Plan:    Taper clonazepam to every other day for a week then stop. Call for hydroxyzine refills as needed.

## 2018-12-21 NOTE — Patient Instructions (Signed)
Taper the clonazepam every other day for a week then stop. Do not combine the hydroxyzine and the clonazepam. Let us know if you wish refills.

## 2018-12-22 ENCOUNTER — Encounter: Payer: Self-pay | Admitting: Family Medicine

## 2018-12-29 ENCOUNTER — Other Ambulatory Visit: Payer: Self-pay

## 2018-12-29 ENCOUNTER — Ambulatory Visit (INDEPENDENT_AMBULATORY_CARE_PROVIDER_SITE_OTHER): Payer: PRIVATE HEALTH INSURANCE | Admitting: Family Medicine

## 2018-12-29 ENCOUNTER — Encounter: Payer: Self-pay | Admitting: Family Medicine

## 2018-12-29 VITALS — BP 116/84 | HR 89 | Temp 98.3°F | Ht 74.0 in | Wt 226.6 lb

## 2018-12-29 DIAGNOSIS — R059 Cough, unspecified: Secondary | ICD-10-CM

## 2018-12-29 DIAGNOSIS — F1020 Alcohol dependence, uncomplicated: Secondary | ICD-10-CM

## 2018-12-29 DIAGNOSIS — J321 Chronic frontal sinusitis: Secondary | ICD-10-CM

## 2018-12-29 DIAGNOSIS — R05 Cough: Secondary | ICD-10-CM

## 2018-12-29 NOTE — Progress Notes (Signed)
Patient: Russell Simpson Male    DOB: 04-16-1961   58 y.o.   MRN: [id number] Visit Date: 12/29/2018  Today's Provider: Wilhemena Durie, MD   Chief Complaint  Patient presents with  . Cough    congestion with brown mucus, body aches, not sure on fever, nose congestion, runny nose, since 12/15/18   Subjective:     Cough  This is a new problem. The current episode started 1 to 4 weeks ago. The problem has been gradually worsening. The cough is productive of brown sputum. Associated symptoms include nasal congestion and rhinorrhea. The symptoms are aggravated by lying down, dust and exercise. He has tried OTC cough suppressant and rest for the symptoms. The treatment provided no relief.  He cannot sleep because of the cough. Of note that the patient is now been sober and has not had a drink of alcohol in almost 2 months.  For that he was drinking 12-18 beers per day.  Allergies  Allergen Reactions  . Penicillins Anaphylaxis  . Tetanus Toxoids Swelling  . Lisinopril     Other reaction(s): Other (See Comments) Other reaction(s): Cough Other reaction(s): Cough Other reaction(s): Cough Other reaction(s): Cough  . Losartan     Other reaction(s): Other (See Comments) Other reaction(s): Muscle Pain Other reaction(s): Muscle Pain Other reaction(s): Muscle Pain  . Tetanus Toxoid   . Codeine Nausea Only     Current Outpatient Medications:  .  amLODipine (NORVASC) 5 MG tablet, Take 1 tablet (5 mg total) by mouth daily., Disp: 90 tablet, Rfl: 3 .  Arginine 2000 MG PACK, Take by mouth daily., Disp: , Rfl:  .  cabergoline (DOSTINEX) 0.5 MG tablet, TAKE 0.5 TABLETS BY MOUTH TWICE A WEEK, Disp: 10 tablet, Rfl: 2 .  Carbonyl Iron (PERFECT IRON) 25 MG TABS, Take by mouth daily., Disp: , Rfl:  .  Coenzyme Q10 (COQ-10) 100 MG CAPS, Take by mouth daily., Disp: , Rfl:  .  colchicine 0.6 MG tablet, Take 2 tablets (1.2mg ) by mouth at first sign of gout flare followed by 1 tablet (0.6mg )  after 1 hour. (Max 1.8mg  within 1 hour), Disp: , Rfl:  .  Cyanocobalamin (B-12) 5000 MCG SUBL, Place under the tongue daily., Disp: , Rfl:  .  DHEA 50 MG CAPS, Take by mouth., Disp: , Rfl:  .  DULoxetine (CYMBALTA) 30 MG capsule, Take 2 capsules once daily, Disp: 60 capsule, Rfl: 5 .  finasteride (PROSCAR) 5 MG tablet, Take 1 tablet (5 mg total) by mouth daily., Disp: 90 tablet, Rfl: 3 .  fluticasone (FLONASE) 50 MCG/ACT nasal spray, Place into the nose., Disp: , Rfl:  .  Ginger, Zingiber officinalis, (GINGER PO), Take by mouth., Disp: , Rfl:  .  Glucosamine 500 MG CAPS, Take by mouth daily., Disp: , Rfl:  .  hydrOXYzine (ATARAX/VISTARIL) 25 MG tablet, Take 1 tablet (25 mg total) by mouth 3 (three) times daily as needed., Disp: 30 tablet, Rfl: 0 .  indomethacin (INDOCIN) 50 MG capsule, TAKE 1 CAPSULE BY MOUTH 3 TIMES DAILY WITH MEALS, Disp: 90 capsule, Rfl: 1 .  loratadine (CLARITIN) 10 MG tablet, Take 10 mg by mouth daily. , Disp: , Rfl:  .  magnesium oxide (MAG-OX) 400 MG tablet, Take 400 mg by mouth daily., Disp: , Rfl:  .  metoprolol succinate (TOPROL-XL) 25 MG 24 hr tablet, Take 1 tablet (25 mg total) by mouth daily., Disp: 30 tablet, Rfl: 12 .  montelukast (SINGULAIR) 10 MG  tablet, Take 1 tablet (10 mg total) by mouth at bedtime., Disp: 30 tablet, Rfl: 5 .  naproxen sodium (ANAPROX) 220 MG tablet, Take 220 mg by mouth 2 (two) times daily with a meal., Disp: , Rfl:  .  omega-3 acid ethyl esters (LOVAZA) 1 g capsule, Take by mouth., Disp: , Rfl:  .  Omega-3 Fatty Acids (FISH OIL) 1000 MG CAPS, Take by mouth., Disp: , Rfl:  .  OVER THE COUNTER MEDICATION, OREGA MAX, Disp: , Rfl:  .  OVER THE COUNTER MEDICATION, RED MARINE ALGAE-375 MG., Disp: , Rfl:  .  PROAIR HFA 108 (90 Base) MCG/ACT inhaler, INHALE 2 PUFFS BY MOUTH EVERY 6 HOURS ASNEEDED WHEEZING, Disp: 8.5 g, Rfl: 11 .  sildenafil (REVATIO) 20 MG tablet, 3-5 tablets daily as needed for erectile dysfunction, Disp: 30 tablet, Rfl: 3 .   tamsulosin (FLOMAX) 0.4 MG CAPS capsule, Take 1 capsule (0.4 mg total) by mouth daily., Disp: 90 capsule, Rfl: 3 .  testosterone cypionate (DEPO-TESTOSTERONE) 200 MG/ML injection, 0.34ml weekly IM Weekly or as instructed. 20 Ga. 36ml syringes, Disp: , Rfl:  .  testosterone cypionate (DEPOTESTOTERONE CYPIONATE) 100 MG/ML injection, Inject 1 mL (100 mg total) into the muscle once a week., Disp: 10 mL, Rfl: 1 .  Testosterone Cypionate 200 MG/ML SOLN, 0.21ml weekly IM Weekly or as instructed. 55 Ga. 93ml syringes, Disp: , Rfl:  .  traZODone (DESYREL) 100 MG tablet, trazodone 100 mg tablet, Disp: , Rfl:  .  triamcinolone (KENALOG) 0.025 % cream, , Disp: , Rfl:   Review of Systems  Constitutional: Negative.   HENT: Positive for congestion, rhinorrhea, sinus pressure (under eyes, head) and sinus pain.   Eyes: Negative.   Respiratory: Positive for cough.   Cardiovascular: Negative.   Gastrointestinal: Negative.   Endocrine: Negative.   Genitourinary: Negative.   Musculoskeletal: Negative.   Skin: Negative.   Allergic/Immunologic: Negative.   Neurological: Negative.   Hematological: Negative.   Psychiatric/Behavioral: Negative.     Social History   Tobacco Use  . Smoking status: Light Tobacco Smoker    Types: E-cigarettes, Cigars  . Smokeless tobacco: Never Used  . Tobacco comment: " sometimes during the day and at night"  Substance Use Topics  . Alcohol use: Yes    Alcohol/week: 0.0 standard drinks    Comment: 12 pack a beer a night      Objective:   BP 116/84 (BP Location: Right Arm, Patient Position: Sitting, Cuff Size: Large)   Pulse 89   Temp 98.3 F (36.8 C) (Oral)   Ht 6\' 2"  (1.88 m)   Wt 226 lb 9.6 oz (102.8 kg)   SpO2 98%   BMI 29.09 kg/m  Vitals:   12/29/18 1434  BP: 116/84  Pulse: 89  Temp: 98.3 F (36.8 C)  TempSrc: Oral  SpO2: 98%  Weight: 226 lb 9.6 oz (102.8 kg)  Height: 6\' 2"  (1.88 m)     Physical Exam Constitutional:      Appearance: He is  well-developed.  HENT:     Head: Normocephalic and atraumatic.     Comments: Frontal sinus tenderness    Right Ear: External ear normal.     Left Ear: External ear normal.  Eyes:     General: No scleral icterus. Neck:     Thyroid: No thyromegaly.  Cardiovascular:     Rate and Rhythm: Normal rate and regular rhythm.     Heart sounds: Normal heart sounds.  Pulmonary:     Effort: Pulmonary  effort is normal.     Breath sounds: Normal breath sounds.  Abdominal:     Palpations: Abdomen is soft.  Lymphadenopathy:     Cervical: No cervical adenopathy.  Skin:    General: Skin is warm and dry.  Neurological:     Mental Status: He is alert and oriented to person, place, and time.  Psychiatric:        Behavior: Behavior normal.        Thought Content: Thought content normal.        Judgment: Judgment normal.         Assessment & Plan    1. Chronic frontal sinusitis Written twice daily for 10 days  2. Cough Tussionex for nighttime cough. 3.Alcoholism Pt sober almost 2 months  I have done the exam and reviewed the chart and it is accurate to the best of my knowledge. Development worker, community has been used and  any errors in dictation or transcription are unintentional. Miguel Aschoff M.D. [place], MD  [facility]

## 2018-12-30 MED ORDER — DOXYCYCLINE HYCLATE 100 MG PO TABS: 100.0000 mg | ORAL_TABLET | Freq: Two times a day (BID) | ORAL | 0 refills | Status: DC

## 2018-12-30 MED ORDER — HYDROCOD POLST-CPM POLST ER 10-8 MG/5ML PO SUER: 5.0000 mL | Freq: Every evening | ORAL | 0 refills | Status: DC | PRN

## 2019-01-01 DIAGNOSIS — F1020 Alcohol dependence, uncomplicated: Secondary | ICD-10-CM

## 2019-01-01 DIAGNOSIS — F1021 Alcohol dependence, in remission: Secondary | ICD-10-CM | POA: Insufficient documentation

## 2019-01-04 ENCOUNTER — Ambulatory Visit (INDEPENDENT_AMBULATORY_CARE_PROVIDER_SITE_OTHER): Payer: PRIVATE HEALTH INSURANCE | Admitting: Physician Assistant

## 2019-01-04 ENCOUNTER — Encounter: Payer: Self-pay | Admitting: Physician Assistant

## 2019-01-04 VITALS — BP 134/89 | HR 83 | Temp 98.0°F | Resp 16 | Wt 221.0 lb

## 2019-01-04 DIAGNOSIS — J0110 Acute frontal sinusitis, unspecified: Secondary | ICD-10-CM | POA: Diagnosis not present

## 2019-01-04 DIAGNOSIS — J40 Bronchitis, not specified as acute or chronic: Secondary | ICD-10-CM

## 2019-01-04 MED ORDER — BENZONATATE 100 MG PO CAPS: 100.0000 mg | ORAL_CAPSULE | Freq: Three times a day (TID) | ORAL | 0 refills | Status: AC | PRN

## 2019-01-04 MED ORDER — AZITHROMYCIN 250 MG PO TABS: ORAL_TABLET | ORAL | 0 refills | Status: DC

## 2019-01-04 MED ORDER — IPRATROPIUM-ALBUTEROL 0.5-2.5 (3) MG/3ML IN SOLN: 3.0000 mL | Freq: Once | RESPIRATORY_TRACT | Status: DC

## 2019-01-04 MED ORDER — PREDNISONE 10 MG PO TABS: 10.0000 mg | ORAL_TABLET | Freq: Two times a day (BID) | ORAL | 0 refills | Status: AC

## 2019-01-04 NOTE — Patient Instructions (Signed)
Acute Bronchitis, Adult Acute bronchitis is when air tubes (bronchi) in the lungs suddenly get swollen. The condition can make it hard to breathe. It can also cause these symptoms:  A cough.  Coughing up clear, yellow, or green mucus.  Wheezing.  Chest congestion.  Shortness of breath.  A fever.  Body aches.  Chills.  A sore throat. Follow these instructions at home:  Medicines  Take over-the-counter and prescription medicines only as told by your doctor.  If you were prescribed an antibiotic medicine, take it as told by your doctor. Do not stop taking the antibiotic even if you start to feel better. General instructions  Rest.  Drink enough fluids to keep your pee (urine) pale yellow.  Avoid smoking and secondhand smoke. If you smoke and you need help quitting, ask your doctor. Quitting will help your lungs heal faster.  Use an inhaler, cool mist vaporizer, or humidifier as told by your doctor.  Keep all follow-up visits as told by your doctor. This is important. How is this prevented? To lower your risk of getting this condition again:  Wash your hands often with soap and water. If you cannot use soap and water, use hand sanitizer.  Avoid contact with people who have cold symptoms.  Try not to touch your hands to your mouth, nose, or eyes.  Make sure to get the flu shot every year. Contact a doctor if:  Your symptoms do not get better in 2 weeks. Get help right away if:  You cough up blood.  You have chest pain.  You have very bad shortness of breath.  You become dehydrated.  You faint (pass out) or keep feeling like you are going to pass out.  You keep throwing up (vomiting).  You have a very bad headache.  Your fever or chills gets worse. This information is not intended to replace advice given to you by your health care provider. Make sure you discuss any questions you have with your health care provider. Document Released: 05/04/2008 Document  Revised: 06/30/2017 Document Reviewed: 05/06/2016 Elsevier Interactive Patient Education  2019 [employer]  

## 2019-01-04 NOTE — Progress Notes (Signed)
Patient: Russell Simpson Male    DOB: 20-Dec-1960   58 y.o.   MRN: [id number] Visit Date: 01/07/2019  Today's Provider: Trinna Post, PA-C   Chief Complaint  Patient presents with  . Cough   Subjective:     HPI Upper Respiratory Infection: Patient complains of symptoms of a URI, possible sinusitis. Symptoms include cough. Onset of symptoms was 2 weeks ago, unchanged since that time. He also c/o cough described as nocturnal for the past 2 weeks .  He is drinking plenty of fluids. Evaluation to date: seen previously and thought to have a viral URI. Treatment to date: antibiotics and cough suppressants.  Seen in clinic on 12/29/3028 and treated for sinusitis with doxycycline 100 mg BID x 10 days and hycodan cough syrup. Reports he feels no better, is SOB. Reports he broke out into a rash.    Allergies  Allergen Reactions  . Penicillins Anaphylaxis  . Tetanus Toxoids Swelling  . Lisinopril     Other reaction(s): Other (See Comments) Other reaction(s): Cough Other reaction(s): Cough Other reaction(s): Cough Other reaction(s): Cough  . Losartan     Other reaction(s): Other (See Comments) Other reaction(s): Muscle Pain Other reaction(s): Muscle Pain Other reaction(s): Muscle Pain  . Tetanus Toxoid   . Codeine Nausea Only  . Doxycycline Rash     Current Outpatient Medications:  .  amLODipine (NORVASC) 5 MG tablet, Take 1 tablet (5 mg total) by mouth daily., Disp: 90 tablet, Rfl: 3 .  Arginine 2000 MG PACK, Take by mouth daily., Disp: , Rfl:  .  cabergoline (DOSTINEX) 0.5 MG tablet, TAKE 0.5 TABLETS BY MOUTH TWICE A WEEK, Disp: 10 tablet, Rfl: 2 .  Carbonyl Iron (PERFECT IRON) 25 MG TABS, Take by mouth daily., Disp: , Rfl:  .  Coenzyme Q10 (COQ-10) 100 MG CAPS, Take by mouth daily., Disp: , Rfl:  .  colchicine 0.6 MG tablet, Take 2 tablets (1.2mg ) by mouth at first sign of gout flare followed by 1 tablet (0.6mg ) after 1 hour. (Max 1.8mg  within 1 hour), Disp: , Rfl:  .   Cyanocobalamin (B-12) 5000 MCG SUBL, Place under the tongue daily., Disp: , Rfl:  .  DHEA 50 MG CAPS, Take by mouth., Disp: , Rfl:  .  DULoxetine (CYMBALTA) 30 MG capsule, Take 2 capsules once daily, Disp: 60 capsule, Rfl: 5 .  finasteride (PROSCAR) 5 MG tablet, Take 1 tablet (5 mg total) by mouth daily., Disp: 90 tablet, Rfl: 3 .  fluticasone (FLONASE) 50 MCG/ACT nasal spray, Place into the nose., Disp: , Rfl:  .  Ginger, Zingiber officinalis, (GINGER PO), Take by mouth., Disp: , Rfl:  .  Glucosamine 500 MG CAPS, Take by mouth daily., Disp: , Rfl:  .  hydrOXYzine (ATARAX/VISTARIL) 25 MG tablet, Take 1 tablet (25 mg total) by mouth 3 (three) times daily as needed., Disp: 30 tablet, Rfl: 0 .  indomethacin (INDOCIN) 50 MG capsule, TAKE 1 CAPSULE BY MOUTH 3 TIMES DAILY WITH MEALS, Disp: 90 capsule, Rfl: 1 .  loratadine (CLARITIN) 10 MG tablet, Take 10 mg by mouth daily. , Disp: , Rfl:  .  magnesium oxide (MAG-OX) 400 MG tablet, Take 400 mg by mouth daily., Disp: , Rfl:  .  metoprolol succinate (TOPROL-XL) 25 MG 24 hr tablet, Take 1 tablet (25 mg total) by mouth daily., Disp: 30 tablet, Rfl: 12 .  montelukast (SINGULAIR) 10 MG tablet, Take 1 tablet (10 mg total) by mouth at bedtime., Disp:  30 tablet, Rfl: 5 .  naproxen sodium (ANAPROX) 220 MG tablet, Take 220 mg by mouth 2 (two) times daily with a meal., Disp: , Rfl:  .  omega-3 acid ethyl esters (LOVAZA) 1 g capsule, Take by mouth., Disp: , Rfl:  .  Omega-3 Fatty Acids (FISH OIL) 1000 MG CAPS, Take by mouth., Disp: , Rfl:  .  OVER THE COUNTER MEDICATION, OREGA MAX, Disp: , Rfl:  .  OVER THE COUNTER MEDICATION, RED MARINE ALGAE-375 MG., Disp: , Rfl:  .  PROAIR HFA 108 (90 Base) MCG/ACT inhaler, INHALE 2 PUFFS BY MOUTH EVERY 6 HOURS ASNEEDED WHEEZING, Disp: 8.5 g, Rfl: 11 .  sildenafil (REVATIO) 20 MG tablet, 3-5 tablets daily as needed for erectile dysfunction, Disp: 30 tablet, Rfl: 3 .  tamsulosin (FLOMAX) 0.4 MG CAPS capsule, Take 1 capsule (0.4 mg  total) by mouth daily., Disp: 90 capsule, Rfl: 3 .  testosterone cypionate (DEPO-TESTOSTERONE) 200 MG/ML injection, 0.9ml weekly IM Weekly or as instructed. 23 Ga. 48ml syringes, Disp: , Rfl:  .  testosterone cypionate (DEPOTESTOTERONE CYPIONATE) 100 MG/ML injection, Inject 1 mL (100 mg total) into the muscle once a week., Disp: 10 mL, Rfl: 1 .  Testosterone Cypionate 200 MG/ML SOLN, 0.54ml weekly IM Weekly or as instructed. 74 Ga. 99ml syringes, Disp: , Rfl:  .  traZODone (DESYREL) 100 MG tablet, trazodone 100 mg tablet, Disp: , Rfl:  .  triamcinolone (KENALOG) 0.025 % cream, , Disp: , Rfl:  .  azithromycin (ZITHROMAX) 250 MG tablet, Take 2 pills on day 1, then take one pill each of the following 4 days., Disp: 6 tablet, Rfl: 0 .  benzonatate (TESSALON PERLES) 100 MG capsule, Take 1 capsule (100 mg total) by mouth 3 (three) times daily as needed for up to 7 days., Disp: 21 capsule, Rfl: 0 .  predniSONE (DELTASONE) 10 MG tablet, Take 1 tablet (10 mg total) by mouth 2 (two) times daily with a meal for 5 days., Disp: 10 tablet, Rfl: 0  Current Facility-Administered Medications:  .  ipratropium-albuterol (DUONEB) 0.5-2.5 (3) MG/3ML nebulizer solution 3 mL, 3 mL, Nebulization, Once, Pollak, Adriana M, PA-C  Review of Systems  Constitutional: Negative.   HENT: Positive for postnasal drip, rhinorrhea, sore throat and tinnitus.   Respiratory: Positive for cough and wheezing.     Social History   Tobacco Use  . Smoking status: Light Tobacco Smoker    Types: E-cigarettes, Cigars  . Smokeless tobacco: Never Used  . Tobacco comment: " sometimes during the day and at night"  Substance Use Topics  . Alcohol use: Yes    Alcohol/week: 0.0 standard drinks    Comment: 12 pack a beer a night      Objective:   BP 134/89 (BP Location: Left Arm, Patient Position: Sitting, Cuff Size: Normal)   Pulse 83   Temp 98 F (36.7 C) (Oral)   Resp 16   Wt 221 lb (100.2 kg)   SpO2 95%   BMI 28.37 kg/m    Vitals:   01/04/19 1208  BP: 134/89  Pulse: 83  Resp: 16  Temp: 98 F (36.7 C)  TempSrc: Oral  SpO2: 95%  Weight: 221 lb (100.2 kg)     Physical Exam Constitutional:      Appearance: Normal appearance.  HENT:     Right Ear: Tympanic membrane and ear canal normal.     Left Ear: Tympanic membrane and ear canal normal.     Mouth/Throat:     Mouth: Mucous  membranes are moist.     Pharynx: Oropharynx is clear.  Cardiovascular:     Rate and Rhythm: Normal rate and regular rhythm.     Heart sounds: Normal heart sounds.  Pulmonary:     Effort: Pulmonary effort is normal.     Breath sounds: Wheezing and rhonchi present.     Comments: Decreased air entry bilaterally. Improves after duoneb administration and wheezes and rhonchi become more apparent. Skin:    General: Skin is warm and dry.     Comments: Erythematous papules on arms bilaterally.  Neurological:     Mental Status: He is alert and oriented to person, place, and time. Mental status is at baseline.  Psychiatric:        Mood and Affect: Mood normal.        Behavior: Behavior normal.         Assessment & Plan    1. Bronchitis  - ipratropium-albuterol (DUONEB) 0.5-2.5 (3) MG/3ML nebulizer solution 3 mL - azithromycin (ZITHROMAX) 250 MG tablet; Take 2 pills on day 1, then take one pill each of the following 4 days.  Dispense: 6 tablet; Refill: 0 - benzonatate (TESSALON PERLES) 100 MG capsule; Take 1 capsule (100 mg total) by mouth 3 (three) times daily as needed for up to 7 days.  Dispense: 21 capsule; Refill: 0 - predniSONE (DELTASONE) 10 MG tablet; Take 1 tablet (10 mg total) by mouth 2 (two) times daily with a meal for 5 days.  Dispense: 10 tablet; Refill: 0  2. Acute non-recurrent frontal sinusitis  - azithromycin (ZITHROMAX) 250 MG tablet; Take 2 pills on day 1, then take one pill each of the following 4 days.  Dispense: 6 tablet; Refill: 0  The entirety of the information documented in the History of Present  Illness, Review of Systems and Physical Exam were personally obtained by me. Portions of this information were initially documented by Lynford Humphrey, CMA and reviewed by me for thoroughness and accuracy.   Return if symptoms worsen or fail to improve.      Trinna Post, PA-C  [facility]

## 2019-01-14 ENCOUNTER — Other Ambulatory Visit: Payer: Self-pay | Admitting: Family Medicine

## 2019-01-14 DIAGNOSIS — I10 Essential (primary) hypertension: Secondary | ICD-10-CM

## 2019-01-16 NOTE — Telephone Encounter (Signed)
Pharmacy requesting refills. Thanks!  

## 2019-01-26 ENCOUNTER — Other Ambulatory Visit: Payer: Self-pay | Admitting: Family Medicine

## 2019-01-26 DIAGNOSIS — F419 Anxiety disorder, unspecified: Secondary | ICD-10-CM

## 2019-01-31 ENCOUNTER — Ambulatory Visit (INDEPENDENT_AMBULATORY_CARE_PROVIDER_SITE_OTHER): Payer: PRIVATE HEALTH INSURANCE

## 2019-01-31 ENCOUNTER — Other Ambulatory Visit: Payer: Self-pay

## 2019-01-31 DIAGNOSIS — R300 Dysuria: Secondary | ICD-10-CM

## 2019-01-31 DIAGNOSIS — E291 Testicular hypofunction: Secondary | ICD-10-CM

## 2019-02-01 ENCOUNTER — Telehealth: Payer: Self-pay | Admitting: *Deleted

## 2019-02-01 LAB — TESTOSTERONE: Testosterone: 726 ng/dL (ref 264–916)

## 2019-02-01 LAB — HEMATOCRIT: Hematocrit: 46.8 % (ref 37.5–51.0)

## 2019-02-01 NOTE — Progress Notes (Signed)
Patient complained to lab tech of "symptoms" during lab visit for blood draw and patient was given a cup for a urine sample. Lab technician then advised front desk that patient needed to be added to our schedule. Patient added to nurse schedule.  After speaking with patient, he complained of groin pain after lifting weights and believes he has a hernia. Consulted with Dr Bernardo Heater and advised patient to seek treatment from PCP. Patient not seen in clinic today.

## 2019-02-01 NOTE — Telephone Encounter (Signed)
Notified patient as instructed,Discussed follow-up appointments,

## 2019-02-01 NOTE — Telephone Encounter (Signed)
-----   Message from Abbie Sons, MD sent at 02/01/2019  7:11 AM EST ----- Hematocrit looks good at 46.8.  Testosterone was 726.  Follow-up as scheduled.

## 2019-02-06 ENCOUNTER — Other Ambulatory Visit: Payer: BLUE CROSS/BLUE SHIELD

## 2019-02-09 ENCOUNTER — Encounter: Payer: Self-pay | Admitting: Family Medicine

## 2019-02-14 ENCOUNTER — Encounter: Payer: Self-pay | Admitting: Physician Assistant

## 2019-02-14 ENCOUNTER — Other Ambulatory Visit: Payer: Self-pay | Admitting: Physician Assistant

## 2019-02-14 ENCOUNTER — Other Ambulatory Visit: Payer: Self-pay

## 2019-02-14 ENCOUNTER — Ambulatory Visit (INDEPENDENT_AMBULATORY_CARE_PROVIDER_SITE_OTHER): Payer: PRIVATE HEALTH INSURANCE | Admitting: Physician Assistant

## 2019-02-14 VITALS — BP 127/90 | HR 94 | Temp 98.6°F | Resp 16 | Ht 74.0 in | Wt 223.0 lb

## 2019-02-14 DIAGNOSIS — F419 Anxiety disorder, unspecified: Secondary | ICD-10-CM | POA: Diagnosis not present

## 2019-02-14 DIAGNOSIS — F1020 Alcohol dependence, uncomplicated: Secondary | ICD-10-CM | POA: Diagnosis not present

## 2019-02-14 DIAGNOSIS — F32A Depression, unspecified: Secondary | ICD-10-CM

## 2019-02-14 DIAGNOSIS — Z0000 Encounter for general adult medical examination without abnormal findings: Secondary | ICD-10-CM

## 2019-02-14 DIAGNOSIS — Z981 Arthrodesis status: Secondary | ICD-10-CM | POA: Diagnosis not present

## 2019-02-14 DIAGNOSIS — Z114 Encounter for screening for human immunodeficiency virus [HIV]: Secondary | ICD-10-CM | POA: Diagnosis not present

## 2019-02-14 DIAGNOSIS — F329 Major depressive disorder, single episode, unspecified: Secondary | ICD-10-CM

## 2019-02-14 DIAGNOSIS — E785 Hyperlipidemia, unspecified: Secondary | ICD-10-CM | POA: Diagnosis not present

## 2019-02-14 DIAGNOSIS — G4733 Obstructive sleep apnea (adult) (pediatric): Secondary | ICD-10-CM

## 2019-02-14 DIAGNOSIS — D352 Benign neoplasm of pituitary gland: Secondary | ICD-10-CM

## 2019-02-14 DIAGNOSIS — I10 Essential (primary) hypertension: Secondary | ICD-10-CM | POA: Diagnosis not present

## 2019-02-14 DIAGNOSIS — E291 Testicular hypofunction: Secondary | ICD-10-CM

## 2019-02-14 MED ORDER — BUSPIRONE HCL 7.5 MG PO TABS: 7.5000 mg | ORAL_TABLET | Freq: Two times a day (BID) | ORAL | 0 refills | Status: DC

## 2019-02-14 NOTE — Progress Notes (Signed)
Patient: Russell Simpson, Male    DOB: 1961-02-18, 58 y.o.   MRN: [id number] Visit Date: 02/14/2019  Today's Provider: Trinna Post, PA-C   Chief Complaint  Patient presents with  . Annual Exam   Subjective:    Annual physical exam Russell Simpson is a 58 y.o. male who presents today for health maintenance and complete physical. He feels fairly well, patient has completed treatment at [facility] and states that he is still trying to adjust being off Clonazepam. Patient reports that he has been staying sober but has been dealing with mood adjustment, patient reports that he is taking Hydroxyzine PRN for anxiety but it is not helping with symptom control. He reports exercising 2x a week and maintains a well balanced diet. He reports he is sleeping poorly, patient states that he has difficulty falling asleep, waking up frequently at night to urinate and states that he has been sleeping in past noon some days. Patient also reports today for the past few weeks he has had testicular pain that has been intermittent.   Lives with roommate in [place] with two dogs. Has a daughter, first grandchild due in June. Works as heating, air and refrigeration. Three back surgeries and two neck surgeries. Two cervical and three lumbar fusions. Dr. Karie Chimera neurosurgeon. Dr  HTN: amlodipine 5 mg QD for BP, metoprolol XL 25 mg once daily.   Wt Readings from Last 3 Encounters:  02/14/19 223 lb (101.2 kg)  01/04/19 221 lb (100.2 kg)  12/29/18 226 lb 9.6 oz (102.8 kg)   Sleep Apnea: Noncompliant with CPAP and reports it gets in the way such that he had to pull of mask. CPAP no longer functioning. Reports he was using nasal pillows. Found mask off when he woke up.   History of alcoholism: started drinking since teenage years and before divorce. Able to drink 24 pack of Busch lite and bottle of wine daily. Sober > 100 days. Attending [place] meetings but has since stopped group therapy at [facility] because he said  the place is depressing and he needed a change of pace. Reports he has also been stopping klonopin as well.  Anxiety: Taking 60 mg cymbalta daily. Reports continued anxiety and depression, especially in context of stopping drinking.   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 Yves Dill. He is being treated with cabergoline 0.5 mg twice weekly which he says Dr. Bernardo Heater has been providing but records show our office has been providing this Rx. Last MRI Brain 04/28/2013 shows tiny stable pituitary adenoma, subtle changes of chronic white matter ischemia.  Intermittent Groin Pain: comes and goes, feels like somebody kicked him. Can go on for a couple of days. Legs are not numb, pale, or painful. Does not have testicular mass. Does work out in [employer]. Legs do not get pale or numb during these episodes.   Neurosurgery: Has had multiple cervical and lumbar spine fusions.  -----------------------------------------------------------------   Review of Systems  Constitutional: Positive for activity change.  Respiratory: Positive for cough.   Gastrointestinal: Positive for abdominal pain.  Endocrine: Positive for polyuria.  Musculoskeletal: Positive for neck pain.  Skin: Positive for rash.  Allergic/Immunologic: Negative.   Neurological: Negative.   Hematological: Negative.   Psychiatric/Behavioral: Positive for dysphoric mood.    Social History He  reports that he has been smoking e-cigarettes and cigars. He has never used smokeless tobacco. He  reports current alcohol use. He reports that he does not use drugs. Social History   Socioeconomic History  . Marital status: Married    Spouse name: Not on file  . Number of children: Not on file  . Years of education: Not on file  . Highest education level: Not on file  Occupational History  . Not on file  Social Needs  . Financial resource strain: Not on file  . Food insecurity:     Worry: Not on file    Inability: Not on file  . Transportation needs:    Medical: Not on file    Non-medical: Not on file  Tobacco Use  . Smoking status: Light Tobacco Smoker    Types: E-cigarettes, Cigars  . Smokeless tobacco: Never Used  . Tobacco comment: " sometimes during the day and at night"  Substance and Sexual Activity  . Alcohol use: Yes    Alcohol/week: 0.0 standard drinks    Comment: 12 pack a beer a night  . Drug use: No  . Sexual activity: Yes  Lifestyle  . Physical activity:    Days per week: Not on file    Minutes per session: Not on file  . Stress: Not on file  Relationships  . Social connections:    Talks on phone: Not on file    Gets together: Not on file    Attends religious service: Not on file    Active member of club or organization: Not on file    Attends meetings of clubs or organizations: Not on file    Relationship status: Not on file  Other Topics Concern  . Not on file  Social History Narrative  . Not on file    Patient Active Problem List   Diagnosis Date Noted  . Alcoholism ([place]) 01/01/2019  . Disorder of bursae of shoulder region 07/21/2017  . Full thickness rotator cuff tear 07/21/2017  . Localized, primary osteoarthritis 07/21/2017  . 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 Osteoarthritis of knee 07/21/2017  . Anxiety 01/10/2016  . BP (high blood pressure) 01/10/2016  . Adenoma of pituitary ([place]) 01/10/2016  . Apnea, sleep 01/10/2016  . Hyperlipidemia 10/15/2015  . Pituitary adenoma ([place]) 10/15/2015  . 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 Depression 10/15/2015  . 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 BPH without obstruction/lower urinary tract symptoms 10/15/2015  . Allergic rhinitis 10/15/2015  . Incomplete bladder emptying 05/30/2015  . Hernia, inguinal, left 09/21/2014  . Benign prostatic hyperplasia with urinary obstruction 10/24/2012  . 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 OSTEOMYELITIS, ACUTE, OTHER [facility]  SITE 06/09/2006    Past Surgical History:  Procedure Laterality Date  . ANTERIOR CERVICAL DECOMP/DISCECTOMY FUSION N/A 06/17/2016   Procedure: ANTERIOR CERVICAL DECOMPRESSION FUSION, CERVICAL 3-4, CERVICAL 4-5 WITH INSTRUMENTATION AND ALLOGRAFT;  Surgeon: Phylliss Bob, MD;  Location: [facility];  Service: Orthopedics;  Laterality: N/A;  ANTERIOR CERVICAL DECOMPRESSION FUSION, CERVICAL 3-4, CERVICAL 4-5 WITH INSTRUMENTATION AND ALLOGRAFT  . BACK SURGERY    . NECK SURGERY  2009    Family History  Family Status  Relation Name Status  . Mother  Deceased  . Father  Alive  . Other  (Not Specified)   His family history includes Diabetes in his mother and another family member.     Allergies  Allergen Reactions  . Penicillins Anaphylaxis  . Tetanus Toxoids Swelling  . Lisinopril     Other reaction(s): Other (See Comments) Other reaction(s): Cough Other reaction(s): Cough Other reaction(s): Cough Other  reaction(s): Cough  . Losartan     Other reaction(s): Other (See Comments) Other reaction(s): Muscle Pain Other reaction(s): Muscle Pain Other reaction(s): Muscle Pain  . Tetanus Toxoid   . Codeine Nausea Only  . Doxycycline Rash    Previous Medications   AMLODIPINE (NORVASC) 5 MG TABLET    Take 1 tablet (5 mg total) by mouth daily.   ARGININE 2000 MG PACK    Take by mouth daily.   CABERGOLINE (DOSTINEX) 0.5 MG TABLET    TAKE 0.5 TABLETS BY MOUTH TWICE A WEEK   CARBONYL IRON (PERFECT IRON) 25 MG TABS    Take by mouth daily.   COENZYME Q10 (COQ-10) 100 MG CAPS    Take by mouth daily.   COLCHICINE 0.6 MG TABLET    Take 2 tablets (1.2mg ) by mouth at first sign of gout flare followed by 1 tablet (0.6mg ) after 1 hour. (Max 1.8mg  within 1 hour)   CYANOCOBALAMIN (B-12) 5000 MCG SUBL    Place under the tongue daily.   DHEA 50 MG CAPS    Take by mouth.   DULOXETINE (CYMBALTA) 30 MG CAPSULE    Take 2 capsules once daily   FINASTERIDE (PROSCAR) 5 MG TABLET    Take 1 tablet (5 mg total) by  mouth daily.   FLUTICASONE (FLONASE) 50 MCG/ACT NASAL SPRAY    Place into the nose.   GABAPENTIN (NEURONTIN) 100 MG CAPSULE       GINGER, ZINGIBER OFFICINALIS, (GINGER PO)    Take by mouth.   GLUCOSAMINE 500 MG CAPS    Take by mouth daily.   HYDROXYZINE (ATARAX/VISTARIL) 25 MG TABLET    TAKE 1 TABLET BY MOUTH 3 TIMES DAILY AS NEEDED   INDOMETHACIN (INDOCIN) 50 MG CAPSULE    TAKE 1 CAPSULE BY MOUTH 3 TIMES DAILY WITH MEALS   LORATADINE (CLARITIN) 10 MG TABLET    Take 10 mg by mouth daily.    MAGNESIUM OXIDE (MAG-OX) 400 MG TABLET    Take 400 mg by mouth daily.   METOPROLOL SUCCINATE (TOPROL-XL) 25 MG 24 HR TABLET    TAKE 1 TABLET BY MOUTH ONCE DAILY.   MONTELUKAST (SINGULAIR) 10 MG TABLET    Take 1 tablet (10 mg total) by mouth at bedtime.   NAPROXEN SODIUM (ANAPROX) 220 MG TABLET    Take 220 mg by mouth 2 (two) times daily with a meal.   OMEGA-3 ACID ETHYL ESTERS (LOVAZA) 1 G CAPSULE    Take by mouth.   OMEGA-3 FATTY ACIDS (FISH OIL) 1000 MG CAPS    Take by mouth.   OVER THE COUNTER MEDICATION    OREGA MAX   OVER THE COUNTER MEDICATION    RED MARINE ALGAE-375 MG.   PROAIR HFA 108 (90 BASE) MCG/ACT INHALER    INHALE 2 PUFFS BY MOUTH EVERY 6 HOURS ASNEEDED WHEEZING   SILDENAFIL (REVATIO) 20 MG TABLET    3-5 tablets daily as needed for erectile dysfunction   TAMSULOSIN (FLOMAX) 0.4 MG CAPS CAPSULE    Take 1 capsule (0.4 mg total) by mouth daily.   TESTOSTERONE CYPIONATE (DEPO-TESTOSTERONE) 200 MG/ML INJECTION    0.39ml weekly IM Weekly or as instructed. 60 Ga. 69ml syringes   TESTOSTERONE CYPIONATE (DEPOTESTOTERONE CYPIONATE) 100 MG/ML INJECTION    Inject 1 mL (100 mg total) into the muscle once a week.   TESTOSTERONE CYPIONATE 200 MG/ML SOLN    0.17ml weekly IM Weekly or as instructed. 53 Ga. 14ml syringes   TRAZODONE (DESYREL) 100 MG  TABLET    trazodone 100 mg tablet   TRIAMCINOLONE (KENALOG) 0.025 % CREAM        Patient Care Team: Jerrol Banana., MD as PCP - General (Family Medicine)       Objective:   Vitals: BP 127/90   Pulse 94   Temp 98.6 F (37 C) (Oral)   Resp 16   Ht 6\' 2"  (1.88 m)   Wt 223 lb (101.2 kg)   BMI 28.63 kg/m    Physical Exam Constitutional:      Appearance: Normal appearance.  Cardiovascular:     Rate and Rhythm: Normal rate and regular rhythm.     Heart sounds: Normal heart sounds.  Pulmonary:     Effort: Pulmonary effort is normal.     Breath sounds: Normal breath sounds.  Abdominal:     General: Abdomen is flat.     Palpations: Abdomen is soft.  Skin:    General: Skin is warm and dry.  Neurological:     Mental Status: He is alert and oriented to person, place, and time. Mental status is at baseline.  Psychiatric:        Mood and Affect: Mood normal.        Behavior: Behavior normal.      Depression Screen PHQ 2/9 Scores 02/14/2019 12/29/2018 09/01/2017  PHQ - 2 Score 5 0 4  PHQ- 9 Score 18 - 21      Assessment & Plan:     Routine Health Maintenance and Physical Exam  Exercise Activities and Dietary recommendations Goals   None     Immunization History  Administered Date(s) Administered  . Influenza Split 08/05/2010, 09/30/2011  . Influenza,inj,Quad PF,6+ Mos 09/25/2015, 08/31/2018  . Tdap 05/29/2009    Health Maintenance  Topic Date Due  . HIV Screening  08/04/1977  . TETANUS/TDAP  05/30/2019  . COLONOSCOPY  01/04/2023  . INFLUENZA VACCINE  Completed  . Hepatitis C Screening  Completed     Discussed health benefits of physical activity, and encouraged him to engage in regular exercise appropriate for his age and condition.    1. Annual physical exam   2. Alcoholism ([place])  Continues to attend AA. Will have him establish with CCM for counseling services to help maintain sobriety and with depression. He is additionally weaned off klonopin at this point.   - Ambulatory referral to Chronic Care Management Services  3. Essential hypertension  Controlled, continue current medications.  -  Comprehensive Metabolic Panel (CMET)  4. Hyperlipidemia, unspecified hyperlipidemia type  HLD requiring statin. Start lipitor 10 mg daily.  - Lipid Profile  5. Encounter for screening for HIV  - HIV antibody (with reflex)  6. Anxiety  - TSH  7. Depression, unspecified depression type  Reports worsening depression and anxiety. He is taking cymbalta 60 mg daily. Will add buspar 7.5 mg BID.    8. Pituitary adenoma ([place])  He reports he was getting this medication from his urologist (?) but I do not see that he has gotten this medication from his urologist in recent memory. He will likely need to establish with urology.  9. Testicular hypofunction  Followed by Dr. Bernardo Heater.   10. S/P lumbar fusion   11. Obstructive sleep apnea syndrome  CPAP not functioning. Sleep study from 2018. Will try to write for new script. If intolerant, may need Bipap.   The entirety of the information documented in the History of Present Illness, Review of Systems and Physical Exam were personally  obtained by me. Portions of this information were initially documented by Jennings Books, CMA and reviewed by me for thoroughness and accuracy.   F/u 3 mo for lipids and depression.      --------------------------------------------------------------------

## 2019-02-15 LAB — LIPID PANEL
Chol/HDL Ratio: 4.8 ratio (ref 0.0–5.0)
Cholesterol, Total: 255 mg/dL — ABNORMAL HIGH (ref 100–199)
HDL: 53 mg/dL (ref >39)
LDL Calculated: 171 mg/dL — ABNORMAL HIGH (ref 0–99)
Triglycerides: 155 mg/dL — ABNORMAL HIGH (ref 0–149)
VLDL Cholesterol Cal: 31 mg/dL (ref 5–40)

## 2019-02-15 LAB — COMPREHENSIVE METABOLIC PANEL
ALT: 20 IU/L (ref 0–44)
AST: 17 IU/L (ref 0–40)
Albumin/Globulin Ratio: 2.4 — ABNORMAL HIGH (ref 1.2–2.2)
Albumin: 4.8 g/dL (ref 3.8–4.9)
Alkaline Phosphatase: 117 IU/L (ref 39–117)
BUN/Creatinine Ratio: 11 (ref 9–20)
BUN: 12 mg/dL (ref 6–24)
Bilirubin Total: 0.7 mg/dL (ref 0.0–1.2)
CO2: 21 mmol/L (ref 20–29)
Calcium: 9.9 mg/dL (ref 8.7–10.2)
Chloride: 101 mmol/L (ref 96–106)
Creatinine, Ser: 1.06 mg/dL (ref 0.76–1.27)
GFR calc Af Amer: 90 mL/min/{1.73_m2} (ref >59)
GFR calc non Af Amer: 78 mL/min/{1.73_m2} (ref >59)
Globulin, Total: 2 g/dL (ref 1.5–4.5)
Glucose: 104 mg/dL — ABNORMAL HIGH (ref 65–99)
Potassium: 4.6 mmol/L (ref 3.5–5.2)
Sodium: 139 mmol/L (ref 134–144)
Total Protein: 6.8 g/dL (ref 6.0–8.5)

## 2019-02-15 LAB — HIV ANTIBODY (ROUTINE TESTING W REFLEX): HIV Screen 4th Generation wRfx: NONREACTIVE

## 2019-02-15 LAB — TSH: TSH: 3.8 u[IU]/mL (ref 0.450–4.500)

## 2019-02-17 ENCOUNTER — Ambulatory Visit: Payer: Self-pay

## 2019-02-17 DIAGNOSIS — F1020 Alcohol dependence, uncomplicated: Secondary | ICD-10-CM

## 2019-02-17 DIAGNOSIS — F419 Anxiety disorder, unspecified: Secondary | ICD-10-CM

## 2019-02-17 DIAGNOSIS — I10 Essential (primary) hypertension: Secondary | ICD-10-CM

## 2019-02-17 NOTE — Chronic Care Management (AMB) (Signed)
  Care Management   Note  02/17/2019 Name: Russell Simpson MRN: [id number] DOB: [birth year]  [name]. [name] is a 58 year old male who sees Carles Collet, [place] for primary care. [name] asked the CCM team to consult the patient for chronic care management/counseling needs secondary to his diagnosis of Alcohol Abuse. Patient has a history of but not limited to HTN, Sleep Apnea, Depression, Anxiety, and Alcoholism. Referral was placed 02/14/2019. Patient's last office visit was 02/14/2019.  Telephone outreach to patient today to introduce CCM services. Patient denied need for CCM Pharmacy referral.  [name] was given information about Care Management services today including:  1. Case Management services include personalized support from designated clinical staff supervised by a physician, including individualized plan of care and coordination with other care providers 2. 24/7 contact phone numbers for assistance for urgent and routine care needs. 3. The patient may stop CCM services at any time (effective at the end of the month) by phone call to the office staff.   Patient agreed to services and verbal consent obtained.     Plan: [name] will contact patient next week to engage           CCM RN CM will contact patient next week to assess for education needs and health goals  [name] E. Rollene Rotunda, RN, BSN Nurse Care Coordinator [facility] Care Management [phone]

## 2019-02-17 NOTE — Patient Instructions (Signed)
1. Thank You for allowing the CCM (Chronic Care Management) Team to assist you with your healthcare goals!! We look forward to speaking with you next week 2.  Contact the CCM Team if you have any question or need to reschedule your initial visit.  CCM (Chronic Care Management) Team   Trish Fountain RN, BSN Nurse Care Coordinator  [phone]  Ruben Reason PharmD  Clinical Pharmacist  [phone]   Mr. Pickney was given information about Care Management services today including:  1. Case Management services includes personalized support from designated clinical staff supervised by his physician, including individualized plan of care and coordination with other care providers 2. 24/7 contact phone numbers for assistance for urgent and routine care needs. 3. The patient may stop case management services at any time by phone call to the office staff.  Patient agreed to services and verbal consent obtained.

## 2019-02-20 ENCOUNTER — Ambulatory Visit: Payer: Self-pay

## 2019-02-20 ENCOUNTER — Telehealth: Payer: Self-pay

## 2019-02-20 DIAGNOSIS — E785 Hyperlipidemia, unspecified: Secondary | ICD-10-CM

## 2019-02-20 DIAGNOSIS — I10 Essential (primary) hypertension: Secondary | ICD-10-CM

## 2019-02-20 DIAGNOSIS — E7800 Pure hypercholesterolemia, unspecified: Secondary | ICD-10-CM

## 2019-02-20 MED ORDER — ATORVASTATIN CALCIUM 10 MG PO TABS: 10.0000 mg | ORAL_TABLET | Freq: Every day | ORAL | 0 refills | Status: DC

## 2019-02-20 NOTE — Telephone Encounter (Signed)
-----   Message from Trinna Post, [place] sent at 02/20/2019  3:55 PM EDT ----- Sugar slightly high, can follow up with A1c in office at f/u visit. Cholesterol very elevated and needs treatment. Has he ever been on cholesterol medication? Recommend 10 mg lipitor nightly and f/u office visit with labs in 3 mo. Can cause some muscle cramping but otherwise well tolerated.

## 2019-02-20 NOTE — Chronic Care Management (AMB) (Signed)
Care Management   Unsuccessful Call Note 02/20/2019 Name: ELYA DILORETO MRN: [id number] DOB: 01-06-1961  [name]. Macleod is a 58 year old malewho sees Carles Collet, [place] for primary care. Ms. [name] the CCM team to consult the patient for chronic care management/counseling needs secondary to his diagnosis of Alcohol Abuse. Patient has a history of but not limited to HTN, Sleep Apnea, Depression, Anxiety, and Alcoholism. Referral was placed3/17/2020. Patient's last office visit was 02/14/2019.   Patient was consented to [place], specifically Clinical Social Work, however patient had company at the time and CCM RN CM was not able to assess for RN CM needs. Today CCM RN CM reached out to patient via telephone to confirm his need/lack of for disease management education.   Was unable to reach patient via telephone. I have left HIPAA compliant voicemail asking patient to return my call. (unsuccessful outreach #1).   Plan: Will follow-up within 7 business days via telephone.   Kaedyn Polivka E. Rollene Rotunda, RN, BSN Nurse Care Coordinator [facility] Care Management [phone]

## 2019-02-20 NOTE — Telephone Encounter (Signed)
Patient advised as directed below. Medication sent to [name].

## 2019-02-22 ENCOUNTER — Telehealth: Payer: Self-pay

## 2019-02-22 ENCOUNTER — Ambulatory Visit: Payer: Self-pay | Admitting: *Deleted

## 2019-02-22 DIAGNOSIS — Z981 Arthrodesis status: Secondary | ICD-10-CM | POA: Insufficient documentation

## 2019-02-22 NOTE — Chronic Care Management (AMB) (Signed)
    Care Management   Unsuccessful Call Note 02/22/2019 Name: Russell Simpson MRN: [id number] DOB: 03/27/1961     [name]. Ashleyis a 58year old [place], PA-Cfor primary care. Ms. [name] the CCM team to consult the patient forchronic care management/counseling needs secondary to his diagnosis of Alcohol Abuse. Patient has a history of but not limited toHTN, Sleep Apnea, Depression, Anxiety, and Alcoholism. Referral was placed3/17/2020. Patient's last office visit was3/17/2020.    This social worker was unable to reach patient via telephone today for [employer]. I have left HIPAA compliant voicemail asking patient to return my call. (unsuccessful outreach #1).   Plan: Will follow-up within 7 business days via telephone.      Elliot Gurney, [place] Worker  [place] [facility] Care Management [phone]

## 2019-02-24 ENCOUNTER — Telehealth: Payer: Self-pay | Admitting: Family Medicine

## 2019-02-24 ENCOUNTER — Telehealth: Payer: Self-pay

## 2019-02-24 ENCOUNTER — Ambulatory Visit: Payer: Self-pay

## 2019-02-24 DIAGNOSIS — E7800 Pure hypercholesterolemia, unspecified: Secondary | ICD-10-CM

## 2019-02-24 DIAGNOSIS — I10 Essential (primary) hypertension: Secondary | ICD-10-CM

## 2019-02-24 DIAGNOSIS — F419 Anxiety disorder, unspecified: Secondary | ICD-10-CM

## 2019-02-24 DIAGNOSIS — F1020 Alcohol dependence, uncomplicated: Secondary | ICD-10-CM

## 2019-02-24 NOTE — Chronic Care Management (AMB) (Signed)
Care Management   Unsuccessful Call Note 02/20/2019 Name: [name]          MRN: [id number]       DOB: 22-Oct-1961  [name]. Ashleyis a 58year old [place], PA-Cfor primary care. Ms. [name] the CCM team to consult the patient forchronic care management/counseling needs secondary to his diagnosis of Alcohol Abuse. Patient has a history of but not limited toHTN, Sleep Apnea, Depression, Anxiety, and Alcoholism. Referral was placed3/17/2020. Patient's last office visit was3/17/2020.   Patient was consented to [place], specifically Clinical Social Work, however patient had company at the time and CCM RN CM was not able to assess for RN CM needs. Today CCM RN CM reached out to patient via telephone to confirm his need/lack of for disease management education.  Was unable to reach patient via telephone. Ihave left HIPAA compliant voicemail asking patient to return my call. (unsuccessful outreach #2).  Plan: Will follow-up within 7business days via telephone.   Adileny Delon E. Rollene Rotunda, RN, BSN Nurse Care Coordinator [facility] Care Management [phone]

## 2019-02-24 NOTE — Telephone Encounter (Signed)
[facility] faxed refill request for the following medications:  hydrOXYzine (ATARAX/VISTARIL) 25 MG tablet   Please advise.

## 2019-02-28 MED ORDER — HYDROXYZINE HCL 25 MG PO TABS: 25.0000 mg | ORAL_TABLET | Freq: Three times a day (TID) | ORAL | 2 refills | Status: DC | PRN

## 2019-03-01 ENCOUNTER — Other Ambulatory Visit: Payer: Self-pay

## 2019-03-01 ENCOUNTER — Ambulatory Visit: Payer: Self-pay

## 2019-03-01 ENCOUNTER — Ambulatory Visit: Payer: Self-pay | Admitting: *Deleted

## 2019-03-01 DIAGNOSIS — F329 Major depressive disorder, single episode, unspecified: Secondary | ICD-10-CM

## 2019-03-01 DIAGNOSIS — F1020 Alcohol dependence, uncomplicated: Secondary | ICD-10-CM

## 2019-03-01 DIAGNOSIS — F32A Depression, unspecified: Secondary | ICD-10-CM

## 2019-03-01 DIAGNOSIS — E7800 Pure hypercholesterolemia, unspecified: Secondary | ICD-10-CM

## 2019-03-01 DIAGNOSIS — I10 Essential (primary) hypertension: Secondary | ICD-10-CM

## 2019-03-01 NOTE — Chronic Care Management (AMB) (Signed)
  Care Management   Note  03/01/2019 Name: TED GOODNER MRN: [id number] DOB: 1960/12/30  Care Coordination: Successful telephone encounter to Mr. [name]. [place], 58year old malewho [place], PA-Cfor primary care. Ms. [name] the CCM team to consult the patient forchronic care management/counseling needs secondary to his diagnosis of Alcohol Abuse. Patient has a history of but not limited toHTN, Sleep Apnea, Depression, Anxiety, and Alcoholism. Referral was placed3/17/2020. Patient's last office visit was3/17/2020.  Patient was consented to [place], specifically Clinical Social Work, however patient had company at the time and CCM RN CM was not able to assess for RN CM needs. Today CCM RN CM reached out to patient via telephone to confirm his need/lack of for disease management education.  Mr. Amparo denies need for CCM RN CM services. He is requesting information on low sodium diet and HTN management but wishes information to be sent to email instead of telephone discussion.    Follow Up Plan: Patient will engage with CCM LCSW for counseling. CCM RN CM provided patient with contact information and patient will reach out if needs arise or questions related to education materials provided.  Hasan Douse E. Rollene Rotunda, RN, BSN Nurse Care Coordinator [facility] Care Management [phone]

## 2019-03-01 NOTE — Chronic Care Management (AMB) (Signed)
  Chronic Care Management   Social Work Note  03/01/2019 Name: Russell Simpson MRN: [id number] DOB: 1961-03-12   [name]. Ashleyis a 58year old [place], PA-Cfor primary care. Ms. [name] the CCM team to consult the patient forchronic care management/counseling needs secondary to his diagnosis of Alcohol Abuse. Patient has a history of but not limited toHTN, Sleep Apnea, Depression, Anxiety, and Alcoholism. Referral was placed3/17/2020. Patient's last office visit was3/17/2020.  Patient was reached today by phone however he requested a call back at another time.Patient scheduled the follow up call for 03/02/19 at 1 pm. Goals Addressed   None     Follow Up Plan: Appointment scheduled for SW follow up with client by phone on: 03/02/19 at 1:00pm   Elliot Gurney, [place] Worker  [facility] Care Management [phone]

## 2019-03-01 NOTE — Patient Instructions (Signed)
Thank you allowing the Chronic Care Management Team to be a part of your care! It was a pleasure speaking with you today!  1. Please review following information on low sodium diet and hypertension managment  CCM (Chronic Care Management) Team   Trish Fountain RN, BSN Nurse Care Coordinator  [phone]  Ruben Reason PharmD  Clinical Pharmacist  [phone]   [place], LCSW Clinical Social Worker [phone]    Managing Your Hypertension Hypertension is commonly called high blood pressure. This is when the force of your blood pressing against the walls of your arteries is too strong. Arteries are blood vessels that carry blood from your heart throughout your body. Hypertension forces the heart to work harder to pump blood, and may cause the arteries to become narrow or stiff. Having untreated or uncontrolled hypertension can cause heart attack, stroke, kidney disease, and other problems. What are blood pressure readings? A blood pressure reading consists of a higher number over a lower number. Ideally, your blood pressure should be below 120/80. The first ("top") number is called the systolic pressure. It is a measure of the pressure in your arteries as your heart beats. The second ("bottom") number is called the diastolic pressure. It is a measure of the pressure in your arteries as the heart relaxes. What does my blood pressure reading mean? Blood pressure is classified into four stages. Based on your blood pressure reading, your health care provider may use the following stages to determine what type of treatment you need, if any. Systolic pressure and diastolic pressure are measured in a unit called mm Hg. Normal  Systolic pressure: below 585.  Diastolic pressure: below 80. Elevated  Systolic pressure: 277-824.  Diastolic pressure: below 80. Hypertension stage 1  Systolic pressure: 235-361.  Diastolic pressure: 44-31. Hypertension stage 2  Systolic  pressure: 540 or above.  Diastolic pressure: 90 or above. What health risks are associated with hypertension? Managing your hypertension is an important responsibility. Uncontrolled hypertension can lead to:  A heart attack.  A stroke.  A weakened blood vessel (aneurysm).  Heart failure.  Kidney damage.  Eye damage.  Metabolic syndrome.  Memory and concentration problems. What changes can I make to manage my hypertension? Hypertension can be managed by making lifestyle changes and possibly by taking medicines. Your health care provider will help you make a plan to bring your blood pressure within a normal range. Eating and drinking   Eat a diet that is high in fiber and potassium, and low in salt (sodium), added sugar, and fat. An example eating plan is called the DASH (Dietary Approaches to Stop Hypertension) diet. To eat this way: ? Eat plenty of fresh fruits and vegetables. Try to fill half of your plate at each meal with fruits and vegetables. ? Eat whole grains, such as whole wheat pasta, brown rice, or whole grain bread. Fill about one quarter of your plate with whole grains. ? Eat low-fat diary products. ? Avoid fatty cuts of meat, processed or cured meats, and poultry with skin. Fill about one quarter of your plate with lean proteins such as fish, chicken without skin, beans, eggs, and tofu. ? Avoid premade and processed foods. These tend to be higher in sodium, added sugar, and fat.  Reduce your daily sodium intake. Most people with hypertension should eat less than 1,500 mg of sodium a day.  Limit alcohol intake to no more than 1 drink a day for nonpregnant women and 2 drinks a day  for men. One drink equals 12 oz of beer, 5 oz of wine, or 1 oz of hard liquor. Lifestyle  Work with your health care provider to maintain a healthy body weight, or to lose weight. Ask what an ideal weight is for you.  Get at least 30 minutes of exercise that causes your heart to beat  faster (aerobic exercise) most days of the week. Activities may include walking, swimming, or biking.  Include exercise to strengthen your muscles (resistance exercise), such as weight lifting, as part of your weekly exercise routine. Try to do these types of exercises for 30 minutes at least 3 days a week.  Do not use any products that contain nicotine or tobacco, such as cigarettes and e-cigarettes. If you need help quitting, ask your health care provider.  Control any long-term (chronic) conditions you have, such as high cholesterol or diabetes. Monitoring  Monitor your blood pressure at home as told by your health care provider. Your personal target blood pressure may vary depending on your medical conditions, your age, and other factors.  Have your blood pressure checked regularly, as often as told by your health care provider. Working with your health care provider  Review all the medicines you take with your health care provider because there may be side effects or interactions.  Talk with your health care provider about your diet, exercise habits, and other lifestyle factors that may be contributing to hypertension.  Visit your health care provider regularly. Your health care provider can help you create and adjust your plan for managing hypertension. Will I need medicine to control my blood pressure? Your health care provider may prescribe medicine if lifestyle changes are not enough to get your blood pressure under control, and if:  Your systolic blood pressure is 130 or higher.  Your diastolic blood pressure is 80 or higher. Take medicines only as told by your health care provider. Follow the directions carefully. Blood pressure medicines must be taken as prescribed. The medicine does not work as well when you skip doses. Skipping doses also puts you at risk for problems. Contact a health care provider if:  You think you are having a reaction to medicines you have taken.  You  have repeated (recurrent) headaches.  You feel dizzy.  You have swelling in your ankles.  You have trouble with your vision. Get help right away if:  You develop a severe headache or confusion.  You have unusual weakness or numbness, or you feel faint.  You have severe pain in your chest or abdomen.  You vomit repeatedly.  You have trouble breathing. Summary  Hypertension is when the force of blood pumping through your arteries is too strong. If this condition is not controlled, it may put you at risk for serious complications.  Your personal target blood pressure may vary depending on your medical conditions, your age, and other factors. For most people, a normal blood pressure is less than 120/80.  Hypertension is managed by lifestyle changes, medicines, or both. Lifestyle changes include weight loss, eating a healthy, low-sodium diet, exercising more, and limiting alcohol. This information is not intended to replace advice given to you by your health care provider. Make sure you discuss any questions you have with your health care provider. Document Released: 08/10/2012 Document Revised: 10/14/2016 Document Reviewed: 10/14/2016 Elsevier Interactive Patient Education  2019 [place] Eating Plan DASH stands for "Dietary Approaches to Stop Hypertension." The DASH eating plan is a healthy eating plan that  has been shown to reduce high blood pressure (hypertension). It may also reduce your risk for type 2 diabetes, heart disease, and stroke. The DASH eating plan may also help with weight loss. What are tips for following this plan?  General guidelines  Avoid eating more than 2,300 mg (milligrams) of salt (sodium) a day. If you have hypertension, you may need to reduce your sodium intake to 1,500 mg a day.  Limit alcohol intake to no more than 1 drink a day for nonpregnant women and 2 drinks a day for men. One drink equals 12 oz of beer, 5 oz of wine, or 1 oz of hard  liquor.  Work with your health care provider to maintain a healthy body weight or to lose weight. Ask what an ideal weight is for you.  Get at least 30 minutes of exercise that causes your heart to beat faster (aerobic exercise) most days of the week. Activities may include walking, swimming, or biking.  Work with your health care provider or diet and nutrition specialist (dietitian) to adjust your eating plan to your individual calorie needs. Reading food labels   Check food labels for the amount of sodium per serving. Choose foods with less than 5 percent of the Daily Value of sodium. Generally, foods with less than 300 mg of sodium per serving fit into this eating plan.  To find whole grains, look for the word "whole" as the first word in the ingredient list. Shopping  Buy products labeled as "low-sodium" or "no salt added."  Buy fresh foods. Avoid canned foods and premade or frozen meals. Cooking  Avoid adding salt when cooking. Use salt-free seasonings or herbs instead of table salt or sea salt. Check with your health care provider or pharmacist before using salt substitutes.  Do not fry foods. Cook foods using healthy methods such as baking, boiling, grilling, and broiling instead.  Cook with heart-healthy oils, such as olive, canola, soybean, or sunflower oil. Meal planning  Eat a balanced diet that includes: ? 5 or more servings of fruits and vegetables each day. At each meal, try to fill half of your plate with fruits and vegetables. ? Up to 6-8 servings of whole grains each day. ? Less than 6 oz of lean meat, poultry, or fish each day. A 3-oz serving of meat is about the same size as a deck of cards. One egg equals 1 oz. ? 2 servings of low-fat dairy each day. ? A serving of nuts, seeds, or beans 5 times each week. ? Heart-healthy fats. Healthy fats called Omega-3 fatty acids are found in foods such as flaxseeds and coldwater fish, like sardines, salmon, and mackerel.   Limit how much you eat of the following: ? Canned or prepackaged foods. ? Food that is high in trans fat, such as fried foods. ? Food that is high in saturated fat, such as fatty meat. ? Sweets, desserts, sugary drinks, and other foods with added sugar. ? Full-fat dairy products.  Do not salt foods before eating.  Try to eat at least 2 vegetarian meals each week.  Eat more home-cooked food and less restaurant, buffet, and fast food.  When eating at a restaurant, ask that your food be prepared with less salt or no salt, if possible. What foods are recommended? The items listed may not be a complete list. Talk with your dietitian about what dietary choices are best for you. Grains Whole-grain or whole-wheat bread. Whole-grain or whole-wheat pasta. Brown rice. [place].  Bulgur. Whole-grain and low-sodium cereals. Pita bread. Low-fat, low-sodium crackers. Whole-wheat flour tortillas. Vegetables Fresh or frozen vegetables (raw, steamed, roasted, or grilled). Low-sodium or reduced-sodium tomato and vegetable juice. Low-sodium or reduced-sodium tomato sauce and tomato paste. Low-sodium or reduced-sodium canned vegetables. Fruits All fresh, dried, or frozen fruit. Canned fruit in natural juice (without added sugar). Meat and other protein foods Skinless chicken or [place]. Ground chicken or [place]. Pork with fat trimmed off. Fish and seafood. Egg whites. Dried beans, peas, or lentils. Unsalted nuts, nut butters, and seeds. Unsalted canned beans. Lean cuts of beef with fat trimmed off. Low-sodium, lean deli meat. Dairy Low-fat (1%) or fat-free (skim) milk. Fat-free, low-fat, or reduced-fat cheeses. Nonfat, low-sodium ricotta or cottage cheese. Low-fat or nonfat yogurt. Low-fat, low-sodium cheese. Fats and oils Soft margarine without trans fats. Vegetable oil. Low-fat, reduced-fat, or light mayonnaise and salad dressings (reduced-sodium). Canola, safflower, olive, soybean, and sunflower oils.  Avocado. Seasoning and other foods Herbs. Spices. Seasoning mixes without salt. Unsalted popcorn and pretzels. Fat-free sweets. What foods are not recommended? The items listed may not be a complete list. Talk with your dietitian about what dietary choices are best for you. Grains Baked goods made with fat, such as croissants, muffins, or some breads. Dry pasta or rice meal packs. Vegetables Creamed or fried vegetables. Vegetables in a cheese sauce. Regular canned vegetables (not low-sodium or reduced-sodium). Regular canned tomato sauce and paste (not low-sodium or reduced-sodium). Regular tomato and vegetable juice (not low-sodium or reduced-sodium). Angie Fava. Olives. Fruits Canned fruit in a light or heavy syrup. Fried fruit. Fruit in cream or butter sauce. Meat and other protein foods Fatty cuts of meat. Ribs. Fried meat. Berniece Salines. Sausage. Bologna and other processed lunch meats. Salami. Fatback. Hotdogs. Bratwurst. Salted nuts and seeds. Canned beans with added salt. Canned or smoked fish. Whole eggs or egg yolks. Chicken or [place] with skin. Dairy Whole or 2% milk, cream, and half-and-half. Whole or full-fat cream cheese. Whole-fat or sweetened yogurt. Full-fat cheese. Nondairy creamers. Whipped toppings. Processed cheese and cheese spreads. Fats and oils Butter. Stick margarine. Lard. Shortening. Ghee. Bacon fat. Tropical oils, such as coconut, palm kernel, or palm oil. Seasoning and other foods Salted popcorn and pretzels. Onion salt, garlic salt, seasoned salt, table salt, and sea salt. Worcestershire sauce. Tartar sauce. Barbecue sauce. Teriyaki sauce. Soy sauce, including reduced-sodium. Steak sauce. Canned and packaged gravies. Fish sauce. Oyster sauce. Cocktail sauce. Horseradish that you find on the shelf. Ketchup. Mustard. Meat flavorings and tenderizers. Bouillon cubes. Hot sauce and Tabasco sauce. Premade or packaged marinades. Premade or packaged taco seasonings. Relishes. Regular  salad dressings. Where to find more information:  National Heart, Lung, and [place]: https://wilson-eaton.com/  American Heart Association: www.heart.org Summary  The DASH eating plan is a healthy eating plan that has been shown to reduce high blood pressure (hypertension). It may also reduce your risk for type 2 diabetes, heart disease, and stroke.  he DASH eating plan, you should limit salt (sodium) intake to 2,300 mg a day. If you have hypertension, you may need to reduce your sodium intake to 1,500 mg a day.  When on the DASH eating plan, aim to eat more fresh fruits and vegetables, whole grains, lean proteins, low-fat dairy, and heart-healthy fats.  Work with your health care provider or diet and nutrition specialist (dietitian) to adjust your eating plan to your individual calorie needs. This information is not intended to replace advice given to you by your health care provider. Make sure  you discuss any questions you have with your health care provider. Document Released: 11/05/2011 Document Revised: 11/09/2016 Document Reviewed: 11/09/2016 Elsevier Interactive Patient Education  2019 [employer].

## 2019-03-02 ENCOUNTER — Ambulatory Visit: Payer: Self-pay | Admitting: *Deleted

## 2019-03-02 ENCOUNTER — Telehealth: Payer: Self-pay | Admitting: *Deleted

## 2019-03-02 DIAGNOSIS — F32A Depression, unspecified: Secondary | ICD-10-CM

## 2019-03-02 DIAGNOSIS — F419 Anxiety disorder, unspecified: Secondary | ICD-10-CM

## 2019-03-02 DIAGNOSIS — F329 Major depressive disorder, single episode, unspecified: Secondary | ICD-10-CM

## 2019-03-02 NOTE — Chronic Care Management (AMB) (Signed)
   Care Management   Unsuccessful Call Note 03/02/2019 Name: Russell Simpson MRN: [id number] DOB: 05/26/1961      Russell Simpson. Ashleyis a 58year old [place], PA-Cfor primary care. Ms. [name] the CCM team to consult the patient forchronic care management/counseling needs secondary to his diagnosis of Alcohol Abuse. Patient has a history of but not limited toHTN, Sleep Apnea, Depression, Anxiety, and Alcoholism. Referral was placed3/17/2020. Patient's last office visit was3/17/2020.  This social worker was able to reach patient via telephone yesterday, however he requested that this social worker call back today at 1pm for initial assessment. Patient did not answer. I have left HIPAA compliant voicemail asking patient to return my call. (unsuccessful outreach #3).   Plan: Will follow-up within 7 business days via telephone.     Elliot Gurney, [place] Administrator, [facility] Care Management [phone]

## 2019-03-06 ENCOUNTER — Ambulatory Visit: Payer: Self-pay | Admitting: *Deleted

## 2019-03-06 ENCOUNTER — Telehealth: Payer: Self-pay | Admitting: *Deleted

## 2019-03-06 DIAGNOSIS — F419 Anxiety disorder, unspecified: Secondary | ICD-10-CM

## 2019-03-06 DIAGNOSIS — F1020 Alcohol dependence, uncomplicated: Secondary | ICD-10-CM

## 2019-03-06 NOTE — Chronic Care Management (AMB) (Signed)
    Care Management   Unsuccessful Call Note 03/06/2019 Name: Russell Simpson MRN: [id number] DOB: 13-Mar-1961    Russell Simpson. Ashleyis a 58year old [name], PA-Cfor primary care. Ms. Russell Simpson the CCM team to consult the patient forchronic care management/counseling needs secondary to his diagnosis of Alcohol Abuse. Patient has a history of but not limited toHTN, Sleep Apnea, Depression, Anxiety, and Alcoholism. Referral was placed3/17/2020. Patient's last office visit was3/17/2020.  This social worker was unable to reach patient via telephone today for the initial assessment. I have left HIPAA compliant voicemail asking patient to return my call. (unsuccessful outreach #4).   Plan:This social will close patient at this time due to 4 unsuccessful outreach attempts to reach patient. This social worker's contact information has been left on patient's voicemail.  This social worker will be happy to engage patient once call is returned.      Russell Simpson, [place] Worker  [facility] Care Management [phone]

## 2019-03-15 ENCOUNTER — Telehealth: Payer: Self-pay

## 2019-03-15 NOTE — Telephone Encounter (Signed)
Rx written and sleep study printed. Please print most recent office note addressing sleep apnea and fax to requested supply company. Also, please offer e-visit for Friday and his appointment needs to be moved earlier as we do not have slot at 3:40 any longer.

## 2019-03-15 NOTE — Telephone Encounter (Signed)
Have you seen this pt for this rx?

## 2019-03-15 NOTE — Telephone Encounter (Signed)
rx written and is on desk

## 2019-03-15 NOTE — Telephone Encounter (Signed)
Patient called and stated that his insurance company need for his C-Pap rx to be send to [employer] so it could be covered. [facility] # [phone] & fax # [phone]. Please advise.

## 2019-03-16 NOTE — Telephone Encounter (Signed)
rx for cpap and supplies faxed [facility] [place] at [phone].  dbs

## 2019-03-17 ENCOUNTER — Other Ambulatory Visit: Payer: Self-pay | Admitting: Family Medicine

## 2019-03-17 ENCOUNTER — Ambulatory Visit (INDEPENDENT_AMBULATORY_CARE_PROVIDER_SITE_OTHER): Payer: PRIVATE HEALTH INSURANCE | Admitting: Physician Assistant

## 2019-03-17 ENCOUNTER — Other Ambulatory Visit: Payer: Self-pay | Admitting: Physician Assistant

## 2019-03-17 DIAGNOSIS — F32A Depression, unspecified: Secondary | ICD-10-CM

## 2019-03-17 DIAGNOSIS — G4733 Obstructive sleep apnea (adult) (pediatric): Secondary | ICD-10-CM | POA: Diagnosis not present

## 2019-03-17 DIAGNOSIS — I10 Essential (primary) hypertension: Secondary | ICD-10-CM

## 2019-03-17 DIAGNOSIS — D352 Benign neoplasm of pituitary gland: Secondary | ICD-10-CM

## 2019-03-17 DIAGNOSIS — F419 Anxiety disorder, unspecified: Secondary | ICD-10-CM

## 2019-03-17 DIAGNOSIS — F329 Major depressive disorder, single episode, unspecified: Secondary | ICD-10-CM

## 2019-03-17 MED ORDER — BUSPIRONE HCL 15 MG PO TABS: 15.0000 mg | ORAL_TABLET | Freq: Two times a day (BID) | ORAL | 0 refills | Status: DC

## 2019-03-17 NOTE — Telephone Encounter (Signed)
Please review

## 2019-03-17 NOTE — Patient Instructions (Signed)

## 2019-03-17 NOTE — Progress Notes (Signed)
Patient: Russell Simpson Male    DOB: Oct 12, 1961   58 y.o.   MRN: [id number] Visit Date: 03/21/2019  Today's Provider: Trinna Post, PA-C   Chief Complaint  Patient presents with  . Anxiety   Subjective:    Virtual Visit via Video Note  I connected with [name] on 03/17/19 at  1:20 PM EDT by a video enabled telemedicine application and verified that I am speaking with the correct person using two identifiers.   I discussed the limitations of evaluation and management by telemedicine and the availability of in person appointments. The patient expressed understanding and agreed to proceed.  HPI   Patient is presenting today as a follow up for his anxiety and depression. Longstanding history of alcoholism, has recently achieved sobriety and has been weaned off of klonopin. He was going to [place] but says he doesn't want to go there any more because he wants a change of scenery. Last visit he was on cymbalta 60 mg daily and hydroxyzine PRN. He was started on buspar 7.5 mg BID and reports he hasn't noticed much difference. He was referred to CCM for counseling services but so far has not returned their calls.  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 Patient reports this issue was found remotely by Dr. Yves Dill and that his urologists managed this.   Allergies  Allergen Reactions  . Penicillins Anaphylaxis  . Tetanus Toxoids Swelling  . Lisinopril     Other reaction(s): Other (See Comments) Other reaction(s): Cough Other reaction(s): Cough Other reaction(s): Cough Other reaction(s): Cough  . Losartan     Other reaction(s): Other (See Comments) Other reaction(s): Muscle Pain Other reaction(s): Muscle Pain Other reaction(s): Muscle Pain  . Tetanus Toxoid   . Codeine Nausea Only  . Doxycycline Rash     Current Outpatient Medications:  .  amLODipine (NORVASC) 5 MG tablet, Take 1 tablet (5 mg total) by mouth daily., Disp: 90 tablet, Rfl: 3 .  atorvastatin  (LIPITOR) 10 MG tablet, Take 1 tablet (10 mg total) by mouth at bedtime., Disp: 90 tablet, Rfl: 0 .  cabergoline (DOSTINEX) 0.5 MG tablet, TAKE 0.5 TABLETS BY MOUTH TWICE A WEEK, Disp: 10 tablet, Rfl: 2 .  Coenzyme Q10 (COQ-10) 100 MG CAPS, Take by mouth daily., Disp: , Rfl:  .  colchicine 0.6 MG tablet, Take 2 tablets (1.2mg ) by mouth at first sign of gout flare followed by 1 tablet (0.6mg ) after 1 hour. (Max 1.8mg  within 1 hour), Disp: , Rfl:  .  Cyanocobalamin (B-12) 5000 MCG SUBL, Place under the tongue daily., Disp: , Rfl:  .  DHEA 50 MG CAPS, Take by mouth., Disp: , Rfl:  .  DULoxetine (CYMBALTA) 30 MG capsule, Take 2 capsules once daily, Disp: 60 capsule, Rfl: 5 .  finasteride (PROSCAR) 5 MG tablet, Take 1 tablet (5 mg total) by mouth daily., Disp: 90 tablet, Rfl: 3 .  fluticasone (FLONASE) 50 MCG/ACT nasal spray, Place into the nose., Disp: , Rfl:  .  gabapentin (NEURONTIN) 100 MG capsule, , Disp: , Rfl:  .  Ginger, Zingiber officinalis, (GINGER PO), Take by mouth., Disp: , Rfl:  .  Glucosamine 500 MG CAPS, Take by mouth daily., Disp: , Rfl:  .  hydrOXYzine (ATARAX/VISTARIL) 25 MG tablet, Take 1 tablet (25 mg total) by mouth 3 (three) times daily as needed., Disp: 30 tablet, Rfl: 2 .  loratadine (CLARITIN) 10 MG tablet, Take 10 mg by mouth daily. , Disp: , Rfl:  .  magnesium oxide (MAG-OX) 400 MG tablet, Take 400 mg by mouth daily., Disp: , Rfl:  .  montelukast (SINGULAIR) 10 MG tablet, Take 1 tablet (10 mg total) by mouth at bedtime., Disp: 30 tablet, Rfl: 5 .  naproxen sodium (ANAPROX) 220 MG tablet, Take 220 mg by mouth 2 (two) times daily with a meal., Disp: , Rfl:  .  omega-3 acid ethyl esters (LOVAZA) 1 g capsule, Take by mouth., Disp: , Rfl:  .  Omega-3 Fatty Acids (FISH OIL) 1000 MG CAPS, Take by mouth., Disp: , Rfl:  .  OVER THE COUNTER MEDICATION, OREGA MAX, Disp: , Rfl:  .  OVER THE COUNTER MEDICATION, RED MARINE ALGAE-375 MG., Disp: , Rfl:  .  PROAIR HFA 108 (90 Base) MCG/ACT  inhaler, INHALE 2 PUFFS BY MOUTH EVERY 6 HOURS ASNEEDED WHEEZING, Disp: 8.5 g, Rfl: 11 .  sildenafil (REVATIO) 20 MG tablet, 3-5 tablets daily as needed for erectile dysfunction, Disp: 30 tablet, Rfl: 3 .  tamsulosin (FLOMAX) 0.4 MG CAPS capsule, Take 1 capsule (0.4 mg total) by mouth daily., Disp: 90 capsule, Rfl: 3 .  testosterone cypionate (DEPO-TESTOSTERONE) 200 MG/ML injection, 0.60ml weekly IM Weekly or as instructed. 20 Ga. 57ml syringes, Disp: , Rfl:  .  testosterone cypionate (DEPOTESTOTERONE CYPIONATE) 100 MG/ML injection, Inject 1 mL (100 mg total) into the muscle once a week., Disp: 10 mL, Rfl: 1 .  Testosterone Cypionate 200 MG/ML SOLN, 0.26ml weekly IM Weekly or as instructed. 59 Ga. 14ml syringes, Disp: , Rfl:  .  triamcinolone (KENALOG) 0.025 % cream, , Disp: , Rfl:  .  Arginine 2000 MG PACK, Take by mouth daily., Disp: , Rfl:  .  busPIRone (BUSPAR) 15 MG tablet, Take 1 tablet (15 mg total) by mouth 2 (two) times daily., Disp: 180 tablet, Rfl: 0 .  Carbonyl Iron (PERFECT IRON) 25 MG TABS, Take by mouth daily., Disp: , Rfl:  .  indomethacin (INDOCIN) 50 MG capsule, TAKE 1 CAPSULE BY MOUTH 3 TIMES PER DAY WITH MEALS, Disp: 90 capsule, Rfl: 5 .  metoprolol succinate (TOPROL-XL) 25 MG 24 hr tablet, TAKE 1 TABLET BY MOUTH ONCE DAILY., Disp: 30 tablet, Rfl: 5 .  traZODone (DESYREL) 100 MG tablet, trazodone 100 mg tablet, Disp: , Rfl:   Current Facility-Administered Medications:  .  ipratropium-albuterol (DUONEB) 0.5-2.5 (3) MG/3ML nebulizer solution 3 mL, 3 mL, Nebulization, Once, Pollak, Adriana M, PA-C  Review of Systems  All other systems reviewed and are negative.   Social History   Tobacco Use  . Smoking status: Light Tobacco Smoker    Types: E-cigarettes, Cigars  . Smokeless tobacco: Never Used  . Tobacco comment: " sometimes during the day and at night"  Substance Use Topics  . Alcohol use: Yes    Alcohol/week: 0.0 standard drinks    Comment: 12 pack a beer a night       Objective:   There were no vitals taken for this visit. There were no vitals filed for this visit.   Physical Exam      Assessment & Plan    1. Anxiety  Increase buspar as below. He has missed multiple calls from CCM counseling and I have provided him with number to call them back.  - busPIRone (BUSPAR) 15 MG tablet; Take 1 tablet (15 mg total) by mouth 2 (two) times daily.  Dispense: 180 tablet; Refill: 0  2. Adenoma of pituitary [facility])  Have discussed this with his urologist who is agreeable to having patient see endocrinology.  Have placed referral today and discussed this with patient.  3. Obstructive sleep apnea syndrome  Sleep company has tried to contact patient but not gotten through, they need updated insurance information. Letter sent by company, telephone encounter created by myself.  The entirety of the information documented in the History of Present Illness, Review of Systems and Physical Exam were personally obtained by me. Portions of this information were initially documented by Doran Clay, LPN and reviewed by me for thoroughness and accuracy.   Patient location: home Provider location: [place] office  Persons involved in the visit: patient, provider   Interactive audio and video communications were attempted, although failed due to patient's inability to connect to video. Continued visit with audio only interaction with patient agreement.  F/u 2-3 months for anxiety       Trinna Post, PA-C  [facility]

## 2019-03-21 ENCOUNTER — Telehealth: Payer: Self-pay

## 2019-03-21 ENCOUNTER — Telehealth: Payer: Self-pay | Admitting: Physician Assistant

## 2019-03-21 NOTE — Telephone Encounter (Signed)
Fax from sleep supply says referral has been closed due to being unable to reach patient to obtain new insurance information. Also says he is not eligible for new machine? Though I am not sure if this will change with his updated insurance. He needs to call them if he wants this. Number in media fax. Letter has also been sent.

## 2019-03-21 NOTE — Telephone Encounter (Signed)
Please review. Ok to order?  

## 2019-03-21 NOTE — Telephone Encounter (Signed)
Patient was advised and states he will give the sleep supply company a call.

## 2019-03-21 NOTE — Telephone Encounter (Signed)
-----   Message from Truitt Merle sent at 03/21/2019  9:51 AM EDT ----- Review incoming fax

## 2019-03-21 NOTE — Telephone Encounter (Signed)
Yes I thought we faxed it there initially. Can order to fax number provided.

## 2019-03-21 NOTE — Telephone Encounter (Signed)
Patient states he received a CPAP machine from [facility] and his insurance denied coverage due to being out of network. He states he received a bill.  Patient states his insurance is in network with [facility] on [address]. [phone]. He would like the order sent there. CB# [phone]

## 2019-03-22 NOTE — Telephone Encounter (Signed)
Prescription written, sleep study under procedure tab.

## 2019-03-22 NOTE — Telephone Encounter (Signed)
Can you write order out on script pad for me to send to office? KW

## 2019-03-23 NOTE — Telephone Encounter (Signed)
Form faxed to [facility] on [address]. KW

## 2019-03-24 ENCOUNTER — Ambulatory Visit: Payer: Self-pay | Admitting: Family Medicine

## 2019-03-24 NOTE — Progress Notes (Deleted)
Patient: Russell Simpson Male    DOB: February 12, 1961   58 y.o.   MRN: [id number] Visit Date: 03/24/2019  Today's Provider: Vernie Murders, PA   No chief complaint on file.  Subjective:     Rectal Bleeding   The current episode started 5 to 7 days ago. The onset was sudden. The problem has been unchanged.   [facility] Allergies  Allergen Reactions  . Penicillins Anaphylaxis  . Tetanus Toxoids Swelling  . Lisinopril     Other reaction(s): Other (See Comments) Other reaction(s): Cough Other reaction(s): Cough Other reaction(s): Cough Other reaction(s): Cough  . Losartan     Other reaction(s): Other (See Comments) Other reaction(s): Muscle Pain Other reaction(s): Muscle Pain Other reaction(s): Muscle Pain  . Tetanus Toxoid   . Codeine Nausea Only  . Doxycycline Rash     Current Outpatient Medications:  .  amLODipine (NORVASC) 5 MG tablet, Take 1 tablet (5 mg total) by mouth daily., Disp: 90 tablet, Rfl: 3 .  Arginine 2000 MG PACK, Take by mouth daily., Disp: , Rfl:  .  atorvastatin (LIPITOR) 10 MG tablet, Take 1 tablet (10 mg total) by mouth at bedtime., Disp: 90 tablet, Rfl: 0 .  busPIRone (BUSPAR) 15 MG tablet, Take 1 tablet (15 mg total) by mouth 2 (two) times daily., Disp: 180 tablet, Rfl: 0 .  cabergoline (DOSTINEX) 0.5 MG tablet, TAKE 0.5 TABLETS BY MOUTH TWICE A WEEK, Disp: 10 tablet, Rfl: 2 .  Carbonyl Iron (PERFECT IRON) 25 MG TABS, Take by mouth daily., Disp: , Rfl:  .  Coenzyme Q10 (COQ-10) 100 MG CAPS, Take by mouth daily., Disp: , Rfl:  .  colchicine 0.6 MG tablet, Take 2 tablets (1.2mg ) by mouth at first sign of gout flare followed by 1 tablet (0.6mg ) after 1 hour. (Max 1.8mg  within 1 hour), Disp: , Rfl:  .  Cyanocobalamin (B-12) 5000 MCG SUBL, Place under the tongue daily., Disp: , Rfl:  .  DHEA 50 MG CAPS, Take by mouth., Disp: , Rfl:  .  DULoxetine (CYMBALTA) 30 MG capsule, Take 2 capsules once daily, Disp: 60 capsule, Rfl: 5 .  finasteride (PROSCAR) 5 MG  tablet, Take 1 tablet (5 mg total) by mouth daily., Disp: 90 tablet, Rfl: 3 .  fluticasone (FLONASE) 50 MCG/ACT nasal spray, Place into the nose., Disp: , Rfl:  .  gabapentin (NEURONTIN) 100 MG capsule, , Disp: , Rfl:  .  Ginger, Zingiber officinalis, (GINGER PO), Take by mouth., Disp: , Rfl:  .  Glucosamine 500 MG CAPS, Take by mouth daily., Disp: , Rfl:  .  hydrOXYzine (ATARAX/VISTARIL) 25 MG tablet, Take 1 tablet (25 mg total) by mouth 3 (three) times daily as needed., Disp: 30 tablet, Rfl: 2 .  indomethacin (INDOCIN) 50 MG capsule, TAKE 1 CAPSULE BY MOUTH 3 TIMES PER DAY WITH MEALS, Disp: 90 capsule, Rfl: 5 .  loratadine (CLARITIN) 10 MG tablet, Take 10 mg by mouth daily. , Disp: , Rfl:  .  magnesium oxide (MAG-OX) 400 MG tablet, Take 400 mg by mouth daily., Disp: , Rfl:  .  metoprolol succinate (TOPROL-XL) 25 MG 24 hr tablet, TAKE 1 TABLET BY MOUTH ONCE DAILY., Disp: 30 tablet, Rfl: 5 .  montelukast (SINGULAIR) 10 MG tablet, Take 1 tablet (10 mg total) by mouth at bedtime., Disp: 30 tablet, Rfl: 5 .  naproxen sodium (ANAPROX) 220 MG tablet, Take 220 mg by mouth 2 (two) times daily with a meal., Disp: , Rfl:  .  omega-3 acid ethyl esters (LOVAZA) 1 g capsule, Take by mouth., Disp: , Rfl:  .  Omega-3 Fatty Acids (FISH OIL) 1000 MG CAPS, Take by mouth., Disp: , Rfl:  .  OVER THE COUNTER MEDICATION, OREGA MAX, Disp: , Rfl:  .  OVER THE COUNTER MEDICATION, RED MARINE ALGAE-375 MG., Disp: , Rfl:  .  PROAIR HFA 108 (90 Base) MCG/ACT inhaler, INHALE 2 PUFFS BY MOUTH EVERY 6 HOURS ASNEEDED WHEEZING, Disp: 8.5 g, Rfl: 11 .  sildenafil (REVATIO) 20 MG tablet, 3-5 tablets daily as needed for erectile dysfunction, Disp: 30 tablet, Rfl: 3 .  tamsulosin (FLOMAX) 0.4 MG CAPS capsule, Take 1 capsule (0.4 mg total) by mouth daily., Disp: 90 capsule, Rfl: 3 .  testosterone cypionate (DEPO-TESTOSTERONE) 200 MG/ML injection, 0.66ml weekly IM Weekly or as instructed. 31 Ga. 65ml syringes, Disp: , Rfl:  .   testosterone cypionate (DEPOTESTOTERONE CYPIONATE) 100 MG/ML injection, Inject 1 mL (100 mg total) into the muscle once a week., Disp: 10 mL, Rfl: 1 .  Testosterone Cypionate 200 MG/ML SOLN, 0.74ml weekly IM Weekly or as instructed. 12 Ga. 56ml syringes, Disp: , Rfl:  .  traZODone (DESYREL) 100 MG tablet, trazodone 100 mg tablet, Disp: , Rfl:  .  triamcinolone (KENALOG) 0.025 % cream, , Disp: , Rfl:   Current Facility-Administered Medications:  .  ipratropium-albuterol (DUONEB) 0.5-2.5 (3) MG/3ML nebulizer solution 3 mL, 3 mL, Nebulization, Once, Pollak, Adriana M, PA-C  Review of Systems  Gastrointestinal: Positive for hematochezia.    Social History   Tobacco Use  . Smoking status: Light Tobacco Smoker    Types: E-cigarettes, Cigars  . Smokeless tobacco: Never Used  . Tobacco comment: " sometimes during the day and at night"  Substance Use Topics  . Alcohol use: Yes    Alcohol/week: 0.0 standard drinks    Comment: 12 pack a beer a night      Objective:   There were no vitals taken for this visit. There were no vitals filed for this visit.   Physical Exam      Assessment & Plan       I, J ,acting as a Education administrator for [employer], PA.,have documented all relevant documentation on the behalf of [employer], PA,as directed by  [employer], PA while in the presence of [employer], [place]. Vernie Murders, PA  [facility]

## 2019-03-27 ENCOUNTER — Encounter: Payer: Self-pay | Admitting: *Deleted

## 2019-03-27 ENCOUNTER — Ambulatory Visit: Payer: Self-pay | Admitting: *Deleted

## 2019-03-27 DIAGNOSIS — F419 Anxiety disorder, unspecified: Secondary | ICD-10-CM

## 2019-03-27 DIAGNOSIS — F329 Major depressive disorder, single episode, unspecified: Secondary | ICD-10-CM

## 2019-03-27 NOTE — Progress Notes (Signed)
  Chronic Care Management    Clinical Social Work General Follow Up Note  03/27/2019 Name: FILIPPO PULS MRN: [id number] DOB: [birth year]  OCIEL RETHERFORD is a 58 y.o. year old male who is a primary care patient of Jerrol Banana., MD. The CCM team was consulted for assistance with Mental Health Counseling and Resources. This CCM social worker had previously lost contact with patient, however he contacted this Education officer, museum today to assist with symptoms of anxiety and depression.  Review of patient status, including review of consultants reports, relevant laboratory and other test results, and collaboration with appropriate care team members and the patient's provider was performed as part of comprehensive patient evaluation and provision of chronic care management services.    Goals Addressed            This Visit's Progress   . "I want help with my anxiety and depression" (pt-stated)       This CCM social worker received a call from patient today stating that he is struggling with anxiety and depression related which has seemed to increase due to COVID-19. Patient discussed feeling isolated, his job has also slowed down and he has gained about 20 pounds. Per patient, he plans to file for disability due to his history of 3 back and neck surgeries.  Patient discussed having a history of substance abuse  treatment through [facility] and [place] and has not had any alcohol for over 100 days. Patient discussed fears of relapsing. Patient openly discussed his history of trauma and loss. But has no interest in calling his sponsor or going back to treatment stating that he does not do well with virtual visits. This Education officer, museum provided patient with encouragement and emotional support. Initial assessment scheduled for 03/28/19 at 1:00pm    Current Barriers:  . Financial constraints . Mental Health Concerns  . Substance abuse issues - Patient states that he has graduated from the  substance abuse program at [facility] and has been sober for over 100 days . Social Isolation  Clinical Social Work Clinical Goal(s):  [name] Over the next 30 days, patient will work with CCM social worker  to address needs related to his anxiety and depression  Interventions:  . Patient interviewed and appropriate assessments performed . Discussed plans with patient for ongoing care management follow up and provided patient with direct contact information for care management team  .     Patient Self Care Activities:  . Self administers medications as prescribed . Attends all scheduled provider appointments . Performs ADL's independently . Performs IADL's independently . Calls provider office for new concerns or questions  Initial goal documentation          Follow Up Plan: Appointment scheduled for SW follow up with client by phone on: 03/28/2019     Elliot Gurney, [place] Worker  [facility] Care [facility] [phone]

## 2019-03-27 NOTE — Patient Instructions (Signed)
Thank you allowing the Chronic Care Management Team to be a part of your care! It was a pleasure speaking with you today!  1. Please expect a phone call from [place] worker on 03/28/19 at 1:00pm to complete initial assessment.  CCM (Chronic Care Management) Team   Trish Fountain RN, BSN Nurse Care Coordinator  [phone]  Ruben Reason PharmD  Clinical Pharmacist  [phone]   Elliot Gurney, LCSW Clinical Social Worker [phone]  Goals Addressed            This Visit's Progress   . "I want help with my anxiety and depression" (pt-stated)       Current Barriers:  . Financial constraints . Mental Health Concerns  . Substance abuse issues - Patient states that he has graduated from the substance abuse program at [facility] and has been sober for over 100 days . Social Isolation  Clinical Social Work Clinical Goal(s):  [name] Over the next 30 days, patient will work with CCM social worker  to address needs related to his anxiety and depression  Interventions:  . Patient interviewed and appropriate assessments performed . Discussed plans with patient for ongoing care management follow up and provided patient with direct contact information for care management team  .     Patient Self Care Activities:  . Self administers medications as prescribed . Attends all scheduled provider appointments . Performs ADL's independently . Performs IADL's independently . Calls provider office for new concerns or questions  Initial goal documentation         The patient verbalized understanding of instructions provided today and declined a print copy of patient instruction materials.   Telephone follow up appointment with CCM team member scheduled for:03/28/19 at 1:00pm

## 2019-03-28 ENCOUNTER — Ambulatory Visit: Payer: Self-pay | Admitting: *Deleted

## 2019-03-28 DIAGNOSIS — F419 Anxiety disorder, unspecified: Secondary | ICD-10-CM

## 2019-03-28 DIAGNOSIS — F329 Major depressive disorder, single episode, unspecified: Secondary | ICD-10-CM

## 2019-03-28 NOTE — Chronic Care Management (AMB) (Signed)
Initial goal documentation Care Management    Clinical Social Work INITIAL Behavioral Health Screening Note  03/28/2019 Name: Russell Simpson MRN: [id number] DOB: 18-Jan-1961  Russell Simpson is a 58 y.o. year old male who sees Jerrol Banana., MD for primary care. Dr. Rosanna Randy asked the CCM team to consult the patient for assistance with Mental Health Counseling and Resources.   Patient agreed to services and verbal consent obtained.   Referred by: PCP, Dr.Gilbert  Review of patient status, including review of consultants reports, relevant laboratory and other test results, and collaboration with appropriate care team members and the patient's provider was performed as part of comprehensive patient evaluation and provision of chronic care management services.   Patient and/or legal guardian verbally consented to meet with LCSW about presenting concerns.  OBJECTIVE:  Depression screen [facility] 2/9 03/27/2019 02/14/2019 12/29/2018  Decreased Interest 1 3 0  Down, Depressed, Hopeless 1 2 0  PHQ - 2 Score 2 5 0  Altered sleeping 3 3 -  Tired, decreased energy 3 3 -  Change in appetite 1 3 -  Feeling bad or failure about yourself  1 2 -  Trouble concentrating 0 1 -  Moving slowly or fidgety/restless - 0 -  Suicidal thoughts - 1 -  PHQ-9 Score 10 18 -  Difficult doing work/chores - Not difficult at all -     No flowsheet data found.   Current and history of substance use: Patient reports a long history of alcohol use, with it increasing after he lost his job about 1 year ago. Per patient, he fell into a deep depression after his job loss which sparked the need for outpatient substance abuse treatment at [facility]. Patient reports being over 100 days sober. However now with COVID-19 and the isolation that has resulted, his depression and anxiety has re-surfaced. This Education officer, museum provided patient with supportive counseling emphasizing the need for self care and adding structure to his life. It was  also recommended that he return to substance abuse counseling, however patient continues to decline.   Family/Social Information:  Housing Arrangement: Patient lives alone in his own home.      Family members/support persons in your life? Patient reports having very little family support. He is the primary caregiver for his elderly father.    Transportation to appointments provided by self.  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 Per patient, he has applied for Disability, with representation through the [employer].  Food Security: none  Access to Dental Care: yes  Medication Concerns: none   Services Currently in place:  Patient recently graduated from a outpatient substance abuse program through [facility].  Patient enjoys mixing tapes and socializing with friends.  Patient reported stressors: financial uncertainty  Caregiving Needs Primary Caregiver? Patient takes care of her own ADL's and IADL's    Goals Addressed: Current Barriers: . Financial constraints . Mental Health Concerns  . Substance abuse issues - Patient states that he has graduated from the substance abuse program at [facility] and has been sober for over 100 days . Social Isolation  Clinical Social Work Clinical Goal(s):  [name] Over the next 30 days, patient will work with CCM social worker  to address needs related to his anxiety and depression  Interventions: . Patient interviewed and appropriate assessments performed . Provided mental health counseling with regard to patient's symptoms of  anxiety and depression (mental health diagnosis or concern) . Discussed plans with patient for ongoing care management follow up and provided patient with direct contact information for care management team  . Assisted patient with identifying positive coping related to his anxiety and depression     Patient Self Care Activities:  . Self administers medications as prescribed . Attends all scheduled provider appointments . Performs ADL's independently . Performs IADL's independently . Calls provider office for new concerns or questions      Follow Up Plan: This CCM social worker to follow up with patient within 1 week.  Elliot Gurney, [place] Administrator, [facility] Care Management [phone]

## 2019-03-28 NOTE — Patient Instructions (Signed)
Thank you allowing the Chronic Care Management Team to be a part of your care! It was a pleasure speaking with you today!  1. Please continue to practice self care utilizing healthy coping strategies 2. Please call this CCM social worker with questions or concerns.      CCM (Chronic Care Management) Team   Trish Fountain RN, BSN Nurse Care Coordinator  [phone]  Ruben Reason PharmD  Clinical Pharmacist  [phone]   Elliot Gurney, LCSW Clinical Social Worker [phone]  Goals Addressed            This Visit's Progress   . "I want help with my anxiety and depression" (pt-stated)       Current Barriers:  . Financial constraints . Mental Health Concerns  . 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 Social Isolation  Clinical Social Work Clinical Goal(s):  [name] Over the next 30 days, patient will work with CCM social worker  to address needs related to his anxiety and depression  Interventions:  . Patient interviewed and appropriate assessments performed . Provided mental health counseling with regard to patient's symptoms of anxiety and depression (mental health diagnosis or concern) . Discussed plans with patient for ongoing care management follow up and provided patient with direct contact information for care management team  . Assisted patient with identifying positive coping related to his anxiety and depression    Patient Self Care Activities:  . Self administers medications as prescribed . Attends all scheduled provider appointments . Performs ADL's independently . Performs IADL's independently . Calls provider office for new concerns or questions  Initial goal documentation         The patient verbalized understanding of instructions provided today and declined a print copy of patient instruction materials.   The CM team will reach out to the  patient again over the next 7 days.

## 2019-04-05 ENCOUNTER — Ambulatory Visit: Payer: Self-pay | Admitting: *Deleted

## 2019-04-05 DIAGNOSIS — F329 Major depressive disorder, single episode, unspecified: Secondary | ICD-10-CM

## 2019-04-05 DIAGNOSIS — F419 Anxiety disorder, unspecified: Secondary | ICD-10-CM

## 2019-04-05 NOTE — Patient Instructions (Signed)
Thank you allowing the Chronic Care Management Team to be a part of your care! It was a pleasure speaking with you today!  1. Patient to continue to engage this social worker in regular mental health counseling and follow up. 2. Appointment re-scheduled for 04/06/2019     CCM (Chronic Care Management) Team   Trish Fountain RN, BSN Nurse Care Coordinator  [phone]  Ruben Reason PharmD  Clinical Pharmacist  [phone]   [place], LCSW Clinical Social Worker [phone]  Goals Addressed   None      The patient verbalized understanding of instructions provided today and declined a print copy of patient instruction materials.   The CM team will reach out to the patient again on 04/06/19.

## 2019-04-05 NOTE — Chronic Care Management (AMB) (Signed)
  Care Management    Clinical Social Work Follow Up Note  04/05/2019 Name: Russell Simpson MRN: [id number] DOB: Feb 09, 1961  Russell Simpson is a 58 y.o. year old male who is a primary care patient of Jerrol Banana., MD. The CCM team was consulted for assistance with Mental Health Counseling and Resources.   Weekly follow up to patient to discuss current mood and coping skill utilized. Patient answered the phone, however stated that he was sleeping and requested that this social worker call back on 04/06/2019.  Review of patient status, including review of consultants reports, other relevant assessments, and collaboration with appropriate care team members and the patient's provider was performed as part of comprehensive patient evaluation and provision of chronic care management services.      Goals Addressed   None      Weekly follow up to patient to discuss current mood and coping skill utilized. Patient answered the phone, however stated that he was sleeping and requested that this social worker call back on 04/06/2019.  Follow Up Plan: SW will follow up with patient by phone over the next 7 days    [place], [place] Worker  [place] Management [phone]

## 2019-04-07 ENCOUNTER — Ambulatory Visit: Payer: Self-pay | Admitting: *Deleted

## 2019-04-07 DIAGNOSIS — F1010 Alcohol abuse, uncomplicated: Secondary | ICD-10-CM

## 2019-04-07 DIAGNOSIS — F329 Major depressive disorder, single episode, unspecified: Secondary | ICD-10-CM

## 2019-04-07 DIAGNOSIS — F32A Depression, unspecified: Secondary | ICD-10-CM

## 2019-04-07 NOTE — Chronic Care Management (AMB) (Signed)
  Chronic Care Management   Social Work Note  04/07/2019 Name: JAYESH MARBACH MRN: [id number] DOB: 1961/03/02  KUE FOX is a 58 y.o. year old male who sees Jerrol Banana., MD for primary care. The CCM team was consulted for assistance with Mental Health Counseling and Resources.   Patient contacted today, however per patient he was on his way to a job and was not able to talk. Patient requested that I call him next week  Goals Addressed   None     Follow Up Plan: This social worker will follow up with patient by phone on 04/12/2019   Elliot Gurney, [place] Worker  [facility] Care Management [phone]

## 2019-04-07 NOTE — Patient Instructions (Signed)
Thank you allowing the Chronic Care Management Team to be a part of your care! It was a pleasure speaking with you today!  1. CCM social worker to follow up as requested on 04/12/2019 at 1:00 pm      CCM (Chronic Care Management) Team   Trish Fountain RN, BSN Nurse Care Coordinator  [phone]  Ruben Reason PharmD  Clinical Pharmacist  [phone]   [place], [place] Worker [phone]  Goals Addressed   None      The patient verbalized understanding of instructions provided today and declined a print copy of patient instruction materials.   The CM team will reach out to the patient again on 5/13/202 at 1:00 pm.

## 2019-04-10 ENCOUNTER — Other Ambulatory Visit: Payer: Self-pay | Admitting: Internal Medicine

## 2019-04-10 ENCOUNTER — Encounter: Payer: Self-pay | Admitting: Physician Assistant

## 2019-04-10 ENCOUNTER — Ambulatory Visit (INDEPENDENT_AMBULATORY_CARE_PROVIDER_SITE_OTHER): Payer: PRIVATE HEALTH INSURANCE | Admitting: Physician Assistant

## 2019-04-10 ENCOUNTER — Other Ambulatory Visit: Payer: Self-pay

## 2019-04-10 VITALS — BP 143/96 | HR 98 | Temp 98.1°F | Resp 16 | Wt 220.0 lb

## 2019-04-10 DIAGNOSIS — R350 Frequency of micturition: Secondary | ICD-10-CM | POA: Diagnosis not present

## 2019-04-10 DIAGNOSIS — M62838 Other muscle spasm: Secondary | ICD-10-CM | POA: Diagnosis not present

## 2019-04-10 DIAGNOSIS — M545 Low back pain, unspecified: Secondary | ICD-10-CM

## 2019-04-10 DIAGNOSIS — N410 Acute prostatitis: Secondary | ICD-10-CM

## 2019-04-10 DIAGNOSIS — D352 Benign neoplasm of pituitary gland: Secondary | ICD-10-CM

## 2019-04-10 LAB — POCT URINALYSIS DIPSTICK
Appearance: ABNORMAL
Bilirubin, UA: NEGATIVE
Blood, UA: NEGATIVE
Glucose, UA: NEGATIVE
Leukocytes, UA: NEGATIVE
Nitrite, UA: NEGATIVE
Odor: NORMAL
Protein, UA: POSITIVE — AB
Spec Grav, UA: 1.02 (ref 1.010–1.025)
Urobilinogen, UA: 0.2 E.U./dL
pH, UA: 6 (ref 5.0–8.0)

## 2019-04-10 MED ORDER — CYCLOBENZAPRINE HCL 10 MG PO TABS: 10.0000 mg | ORAL_TABLET | Freq: Three times a day (TID) | ORAL | 0 refills | Status: DC | PRN

## 2019-04-10 MED ORDER — KETOROLAC TROMETHAMINE 60 MG/2ML IM SOLN
60.0000 mg | Freq: Once | INTRAMUSCULAR | Status: AC
  Administered 2019-04-10: 60 mg via INTRAMUSCULAR

## 2019-04-10 MED ORDER — SULFAMETHOXAZOLE-TRIMETHOPRIM 800-160 MG PO TABS: 1.0000 | ORAL_TABLET | Freq: Two times a day (BID) | ORAL | 0 refills | Status: DC

## 2019-04-10 NOTE — Progress Notes (Signed)
Patient: Russell Simpson Male    DOB: Nov 15, 1961   58 y.o.   MRN: [id number] Visit Date: 04/25/2019  Today's Provider: Mar Daring, PA-C   Chief Complaint  Patient presents with  . Back Pain   Subjective:     Urinary Frequency   This is a new problem. The current episode started in the past 7 days (3 days). The problem occurs intermittently. The pain is moderate. There has been no fever. Associated symptoms include frequency and urgency. Pertinent negatives include no chills, discharge, flank pain, hematuria, hesitancy, nausea, possible pregnancy, sweats or vomiting. Treatments tried: Old prescription of opiods. The treatment provided no relief.    Patient states he has urine frequency and back pain for 3 days. Also has symptoms of hives, urgency, low abdominal pain,and blood in stool. Patient states he found an old prescription of opioids and took some of them for pain with no relief.  Allergies  Allergen Reactions  . Penicillins Anaphylaxis  . Tetanus Toxoids Swelling  . Lisinopril     Other reaction(s): Other (See Comments) Other reaction(s): Cough Other reaction(s): Cough Other reaction(s): Cough Other reaction(s): Cough  . Losartan     Other reaction(s): Other (See Comments) Other reaction(s): Muscle Pain Other reaction(s): Muscle Pain Other reaction(s): Muscle Pain  . Tetanus Toxoid   . Codeine Nausea Only  . Doxycycline Rash     Current Outpatient Medications:  .  amLODipine (NORVASC) 5 MG tablet, Take 1 tablet (5 mg total) by mouth daily., Disp: 90 tablet, Rfl: 3 .  Arginine 2000 MG PACK, Take by mouth daily., Disp: , Rfl:  .  atorvastatin (LIPITOR) 10 MG tablet, Take 1 tablet (10 mg total) by mouth at bedtime., Disp: 90 tablet, Rfl: 0 .  busPIRone (BUSPAR) 15 MG tablet, Take 1 tablet (15 mg total) by mouth 2 (two) times daily., Disp: 180 tablet, Rfl: 0 .  Carbonyl Iron (PERFECT IRON) 25 MG TABS, Take by mouth daily., Disp: , Rfl:  .  Coenzyme Q10  (COQ-10) 100 MG CAPS, Take by mouth daily., Disp: , Rfl:  .  colchicine 0.6 MG tablet, Take 2 tablets (1.2mg ) by mouth at first sign of gout flare followed by 1 tablet (0.6mg ) after 1 hour. (Max 1.8mg  within 1 hour), Disp: , Rfl:  .  Cyanocobalamin (B-12) 5000 MCG SUBL, Place under the tongue daily., Disp: , Rfl:  .  DHEA 50 MG CAPS, Take by mouth., Disp: , Rfl:  .  finasteride (PROSCAR) 5 MG tablet, Take 1 tablet (5 mg total) by mouth daily., Disp: 90 tablet, Rfl: 3 .  fluticasone (FLONASE) 50 MCG/ACT nasal spray, Place into the nose., Disp: , Rfl:  .  gabapentin (NEURONTIN) 100 MG capsule, , Disp: , Rfl:  .  Ginger, Zingiber officinalis, (GINGER PO), Take by mouth., Disp: , Rfl:  .  Glucosamine 500 MG CAPS, Take by mouth daily., Disp: , Rfl:  .  indomethacin (INDOCIN) 50 MG capsule, TAKE 1 CAPSULE BY MOUTH 3 TIMES PER DAY WITH MEALS, Disp: 90 capsule, Rfl: 5 .  loratadine (CLARITIN) 10 MG tablet, Take 10 mg by mouth daily. , Disp: , Rfl:  .  magnesium oxide (MAG-OX) 400 MG tablet, Take 400 mg by mouth daily., Disp: , Rfl:  .  montelukast (SINGULAIR) 10 MG tablet, Take 1 tablet (10 mg total) by mouth at bedtime., Disp: 30 tablet, Rfl: 5 .  omega-3 acid ethyl esters (LOVAZA) 1 g capsule, Take by mouth., Disp: ,  Rfl:  .  Omega-3 Fatty Acids (FISH OIL) 1000 MG CAPS, Take by mouth., Disp: , Rfl:  .  OVER THE COUNTER MEDICATION, OREGA MAX, Disp: , Rfl:  .  OVER THE COUNTER MEDICATION, RED MARINE ALGAE-375 MG., Disp: , Rfl:  .  PROAIR HFA 108 (90 Base) MCG/ACT inhaler, INHALE 2 PUFFS BY MOUTH EVERY 6 HOURS ASNEEDED WHEEZING, Disp: 8.5 g, Rfl: 11 .  sildenafil (REVATIO) 20 MG tablet, 3-5 tablets daily as needed for erectile dysfunction, Disp: 30 tablet, Rfl: 3 .  tamsulosin (FLOMAX) 0.4 MG CAPS capsule, Take 1 capsule (0.4 mg total) by mouth daily., Disp: 90 capsule, Rfl: 3 .  testosterone cypionate (DEPOTESTOTERONE CYPIONATE) 100 MG/ML injection, Inject 1 mL (100 mg total) into the muscle once a  week., Disp: 10 mL, Rfl: 1 .  Testosterone Cypionate 200 MG/ML SOLN, 0.64ml weekly IM Weekly or as instructed. 57 Ga. 75ml syringes, Disp: , Rfl:  .  traZODone (DESYREL) 100 MG tablet, trazodone 100 mg tablet, Disp: , Rfl:  .  triamcinolone (KENALOG) 0.025 % cream, , Disp: , Rfl:  .  cabergoline (DOSTINEX) 0.5 MG tablet, TAKE 0.5 TABLETS BY MOUTH TWICE A WEEK (Patient not taking: Reported on 04/10/2019), Disp: 10 tablet, Rfl: 2 .  cyclobenzaprine (FLEXERIL) 10 MG tablet, Take 1 tablet (10 mg total) by mouth 3 (three) times daily as needed for muscle spasms., Disp: 30 tablet, Rfl: 0 .  DULoxetine (CYMBALTA) 30 MG capsule, TAKE 2 CAPSULES BY MOUTH ONCE DAILY, Disp: 60 capsule, Rfl: 5 .  hydrOXYzine (ATARAX/VISTARIL) 25 MG tablet, TAKE 1 TABLET BY MOUTH 3 TIMES DAILY AS NEEDED, Disp: 30 tablet, Rfl: 2 .  metoprolol succinate (TOPROL-XL) 25 MG 24 hr tablet, TAKE 1 TABLET BY MOUTH ONCE DAILY., Disp: 30 tablet, Rfl: 5 .  naproxen sodium (ANAPROX) 220 MG tablet, Take 220 mg by mouth 2 (two) times daily with a meal., Disp: , Rfl:  .  sulfamethoxazole-trimethoprim (BACTRIM DS) 800-160 MG tablet, Take 1 tablet by mouth 2 (two) times daily., Disp: 14 tablet, Rfl: 0  Review of Systems  Constitutional: Negative for appetite change, chills and fever.  Respiratory: Negative for chest tightness, shortness of breath and wheezing.   Cardiovascular: Negative for chest pain and palpitations.  Gastrointestinal: Negative for abdominal pain, nausea and vomiting.  Endocrine: Negative.   Genitourinary: Positive for frequency and urgency. Negative for discharge, dysuria, flank pain, genital sores, hematuria, hesitancy, penile pain, penile swelling, scrotal swelling and testicular pain.  Musculoskeletal: Positive for back pain and gait problem.  Neurological: Negative for numbness.    Social History   Tobacco Use  . Smoking status: Light Tobacco Smoker    Types: E-cigarettes, Cigars  . Smokeless tobacco: Never Used   . Tobacco comment: " sometimes during the day and at night"  Substance Use Topics  . Alcohol use: Not Currently    Alcohol/week: 0.0 standard drinks    Comment: 12 pack a beer a night      Objective:   BP (!) 143/96 (BP Location: Left Arm, Patient Position: Sitting, Cuff Size: Large)   Pulse 98   Temp 98.1 F (36.7 C) (Oral)   Resp 16   Wt 220 lb (99.8 kg)   SpO2 93%   BMI 28.25 kg/m  Vitals:   04/10/19 1540  BP: (!) 143/96  Pulse: 98  Resp: 16  Temp: 98.1 F (36.7 C)  TempSrc: Oral  SpO2: 93%  Weight: 220 lb (99.8 kg)     Physical Exam Vitals signs  reviewed.  Constitutional:      General: He is not in acute distress.    Appearance: Normal appearance. He is well-developed. He is obese. He is not ill-appearing or diaphoretic.  Cardiovascular:     Rate and Rhythm: Normal rate and regular rhythm.     Heart sounds: Normal heart sounds. No murmur. No friction rub. No gallop.   Pulmonary:     Effort: Pulmonary effort is normal. No respiratory distress.     Breath sounds: Normal breath sounds. No wheezing or rales.  Abdominal:     General: Bowel sounds are normal. There is no distension.     Palpations: Abdomen is soft. There is no mass.     Tenderness: There is no abdominal tenderness. There is no guarding or rebound.  Musculoskeletal:     Lumbar back: He exhibits tenderness. He exhibits normal range of motion, no bony tenderness and no spasm.       Back:  Skin:    General: Skin is warm and dry.  Neurological:     Mental Status: He is alert and oriented to person, place, and time.         Assessment & Plan    1. Frequency of urination UA normal. Will send for culture and test for GC/Chlamydia as I suspect more prostatitis and a low back strain. I will f/u with him pending results.  - POCT urinalysis dipstick - Urine Culture - GC/Chlamydia Probe Amp([facility])  2. Acute midline low back pain without sciatica Toradol 60mg  given IM in the office for low back  strain. May take tylenol prn, flexeril given for associated muscle spasm. Moist heat, light stretches, epsom salt soaks. Back exercises printed for patient. Call if not improving. - ketorolac (TORADOL) injection 60 mg  3. Muscle spasm See above medical treatment plan. - cyclobenzaprine (FLEXERIL) 10 MG tablet; Take 1 tablet (10 mg total) by mouth 3 (three) times daily as needed for muscle spasms.  Dispense: 30 tablet; Refill: 0  4. Acute prostatitis Will treat suspected prostatitis with Bactrim as noted below. Call if not improving. - sulfamethoxazole-trimethoprim (BACTRIM DS) 800-160 MG tablet; Take 1 tablet by mouth 2 (two) times daily.  Dispense: 14 tablet; Refill: 0      Mar Daring, PA-C  [facility]

## 2019-04-12 LAB — URINE CULTURE: Organism ID, Bacteria: NO GROWTH

## 2019-04-12 LAB — SPECIMEN STATUS REPORT

## 2019-04-13 ENCOUNTER — Telehealth: Payer: Self-pay

## 2019-04-13 NOTE — Telephone Encounter (Signed)
Wonderful. He can continue antibiotic until completed since they appear to be helping.

## 2019-04-13 NOTE — Telephone Encounter (Signed)
-----   Message from Mar Daring, [place] sent at 04/13/2019 12:21 PM EDT ----- Urine culture was negative. Have symptoms improved?

## 2019-04-13 NOTE — Telephone Encounter (Signed)
Patient advised as directed below. Reports that he is feeling well.

## 2019-04-14 ENCOUNTER — Ambulatory Visit: Payer: Self-pay | Admitting: *Deleted

## 2019-04-14 ENCOUNTER — Ambulatory Visit
Admission: RE | Admit: 2019-04-14 | Discharge: 2019-04-14 | Disposition: A | Discharge status: Home / Self Care | Payer: PRIVATE HEALTH INSURANCE | Source: Ambulatory Visit | Attending: Internal Medicine | Admitting: Internal Medicine

## 2019-04-14 ENCOUNTER — Telehealth: Payer: Self-pay | Admitting: *Deleted

## 2019-04-14 ENCOUNTER — Other Ambulatory Visit: Payer: Self-pay

## 2019-04-14 DIAGNOSIS — F419 Anxiety disorder, unspecified: Secondary | ICD-10-CM

## 2019-04-14 DIAGNOSIS — D352 Benign neoplasm of pituitary gland: Secondary | ICD-10-CM | POA: Diagnosis present

## 2019-04-14 DIAGNOSIS — F32A Depression, unspecified: Secondary | ICD-10-CM

## 2019-04-14 DIAGNOSIS — F329 Major depressive disorder, single episode, unspecified: Secondary | ICD-10-CM

## 2019-04-14 HISTORY — DX: Essential (primary) hypertension: I10

## 2019-04-14 LAB — GC/CHLAMYDIA PROBE AMP
Chlamydia trachomatis, NAA: NEGATIVE
Neisseria Gonorrhoeae by PCR: NEGATIVE

## 2019-04-14 LAB — POCT I-STAT CREATININE: Creatinine, Ser: 1.2 mg/dL (ref 0.61–1.24)

## 2019-04-14 MED ORDER — GADOBUTROL 1 MMOL/ML IV SOLN
9.0000 mL | Freq: Once | INTRAVENOUS | Status: AC | PRN
  Administered 2019-04-14: 9 mL via INTRAVENOUS

## 2019-04-14 NOTE — Chronic Care Management (AMB) (Signed)
   Chronic Care Management   Unsuccessful Call Note 04/14/2019 Name: Russell Simpson MRN: [id number] DOB: Sep 01, 1961  Patient is a 58 year old male who sees Dr. Rosanna Randy for primary care. Dr. Rosanna Randy asked the CCM team to consult the patient for Mental Health Counseling and Resources.    This social worker was unable to reach patient via telephone today for mental health follow up. I have left HIPAA compliant voicemail asking patient to return my call. (unsuccessful outreach #1).   Plan: Will follow-up within 7 business days via telephone.     Elliot Gurney, [place] Worker  [facility] Care Management [phone]

## 2019-04-15 ENCOUNTER — Other Ambulatory Visit: Payer: Self-pay | Admitting: Family Medicine

## 2019-04-15 DIAGNOSIS — F329 Major depressive disorder, single episode, unspecified: Secondary | ICD-10-CM

## 2019-04-15 DIAGNOSIS — F32A Depression, unspecified: Secondary | ICD-10-CM

## 2019-04-20 ENCOUNTER — Other Ambulatory Visit: Payer: Self-pay | Admitting: Physician Assistant

## 2019-04-20 DIAGNOSIS — F419 Anxiety disorder, unspecified: Secondary | ICD-10-CM

## 2019-04-20 NOTE — Telephone Encounter (Signed)
Pharmacy requesting refills. Thanks!  

## 2019-04-21 ENCOUNTER — Ambulatory Visit: Payer: Self-pay | Admitting: *Deleted

## 2019-04-21 ENCOUNTER — Ambulatory Visit: Payer: Self-pay

## 2019-04-21 ENCOUNTER — Telehealth: Payer: Self-pay

## 2019-04-21 DIAGNOSIS — F419 Anxiety disorder, unspecified: Secondary | ICD-10-CM

## 2019-04-21 DIAGNOSIS — F32A Depression, unspecified: Secondary | ICD-10-CM

## 2019-04-21 DIAGNOSIS — F329 Major depressive disorder, single episode, unspecified: Secondary | ICD-10-CM

## 2019-04-21 NOTE — Chronic Care Management (AMB) (Addendum)
    Care Management   Unsuccessful Call Note 04/21/2019 Name: Russell Simpson MRN: [id number] DOB: [birth year]  Russell Simpson is a 58 year old male who sees Dr. Rosanna Randy for primary care. Dr. Rosanna Randy asked the CCM team to consult the patient for Mental Health Counseling and Resources.    This social worker was unable to reach patient via telephone today for mental health follow up. I have left HIPAA compliant voicemail asking patient to return my call. (unsuccessful outreach #2).   Plan: Will follow-up within 7 business days via telephone.      Elliot Gurney, [place] Worker  [facility] Care Management [phone]

## 2019-04-28 ENCOUNTER — Telehealth: Payer: Self-pay

## 2019-04-28 ENCOUNTER — Ambulatory Visit: Payer: Self-pay | Admitting: *Deleted

## 2019-04-28 DIAGNOSIS — F329 Major depressive disorder, single episode, unspecified: Secondary | ICD-10-CM

## 2019-04-28 DIAGNOSIS — F419 Anxiety disorder, unspecified: Secondary | ICD-10-CM

## 2019-04-28 DIAGNOSIS — F32A Depression, unspecified: Secondary | ICD-10-CM

## 2019-04-28 NOTE — Chronic Care Management (AMB) (Signed)
   Chronic Care Management   Unsuccessful Call Note 04/28/2019 Name: [name] MRN: [id number] DOB: 1961-02-18   Russell Simpson is a 58 year old male who sees Dr. Rosanna Randy for primary care. Dr. Orma Flaming the CCM team to consult the patient for [place] and Resources  This social worker was unable to reach patient via telephone today for mental health follow up. I have left HIPAA compliant voicemail asking patient to return my call. (unsuccessful outreach #3).   Plan: This Education officer, museum has made multiple calls to patient, with no return call. This Education officer, museum will suspend further attempts to reach patient at this time.  This social worker will be glad to be of assistance to patient  in the future if call is returned.      Elliot Gurney, [place] Worker  [facility] Care Management [phone]

## 2019-05-12 ENCOUNTER — Other Ambulatory Visit: Payer: Self-pay | Admitting: Family Medicine

## 2019-05-12 NOTE — Telephone Encounter (Signed)
I think he sees you now

## 2019-05-12 NOTE — Telephone Encounter (Signed)
His endocrinologist should be managing this. It looks like they were considering stopping this medication pending his imaging and labwork. Please check with endocrinology about the status of this medication.

## 2019-05-12 NOTE — Telephone Encounter (Signed)
Patient stated that he has stopped taking the medication and does not understand why the pharmacy send it to the office for refill. FYI

## 2019-05-12 NOTE — Telephone Encounter (Signed)
Please review

## 2019-05-23 ENCOUNTER — Other Ambulatory Visit: Payer: Self-pay

## 2019-05-23 ENCOUNTER — Encounter: Payer: Self-pay | Admitting: Physician Assistant

## 2019-05-23 ENCOUNTER — Ambulatory Visit (INDEPENDENT_AMBULATORY_CARE_PROVIDER_SITE_OTHER): Payer: PRIVATE HEALTH INSURANCE | Admitting: Physician Assistant

## 2019-05-23 VITALS — BP 165/107 | HR 92 | Temp 98.2°F | Resp 16 | Ht 74.0 in | Wt 231.0 lb

## 2019-05-23 DIAGNOSIS — F331 Major depressive disorder, recurrent, moderate: Secondary | ICD-10-CM | POA: Diagnosis not present

## 2019-05-23 DIAGNOSIS — E236 Other disorders of pituitary gland: Secondary | ICD-10-CM

## 2019-05-23 DIAGNOSIS — F419 Anxiety disorder, unspecified: Secondary | ICD-10-CM | POA: Diagnosis not present

## 2019-05-23 DIAGNOSIS — R7303 Prediabetes: Secondary | ICD-10-CM

## 2019-05-23 DIAGNOSIS — F32A Depression, unspecified: Secondary | ICD-10-CM

## 2019-05-23 DIAGNOSIS — R21 Rash and other nonspecific skin eruption: Secondary | ICD-10-CM | POA: Diagnosis not present

## 2019-05-23 DIAGNOSIS — I10 Essential (primary) hypertension: Secondary | ICD-10-CM

## 2019-05-23 DIAGNOSIS — F329 Major depressive disorder, single episode, unspecified: Secondary | ICD-10-CM

## 2019-05-23 LAB — POCT GLYCOSYLATED HEMOGLOBIN (HGB A1C)
Est. average glucose Bld gHb Est-mCnc: 120
Hemoglobin A1C: 5.8 % — AB (ref 4.0–5.6)

## 2019-05-23 MED ORDER — TRIAMCINOLONE ACETONIDE 0.1 % EX CREA: 1.0000 "application " | TOPICAL_CREAM | Freq: Two times a day (BID) | CUTANEOUS | 0 refills | Status: DC

## 2019-05-23 NOTE — Progress Notes (Signed)
Patient: Russell Simpson Male    DOB: 1961/01/09   58 y.o.   MRN: [id number] Visit Date: 05/23/2019  Today's Provider: Trinna Post, PA-C   Chief Complaint  Patient presents with  . Follow-up   Subjective:     HPI  Follow up for anxiety  The patient was last seen for this 2 months ago. Changes made at last visit include no changes.  He reports excellent compliance with treatment. He feels that condition is Unchanged. He is not having side effects.   Hypertension: Reports he is not taking his medication as prescribed. Has gained weight, reports being unable to work out at gym due to [employer]. Currently prescribed norvasc 5 mg daily, metoprolol succinate 25 mg daily.   BP Readings from Last 3 Encounters:  05/23/19 (!) 165/107  04/10/19 (!) 143/96  02/14/19 127/90    Wt Readings from Last 3 Encounters:  05/23/19 231 lb (104.8 kg)  04/10/19 220 lb (99.8 kg)  02/14/19 223 lb (101.2 kg)   Prediabetes  Lab Results  Component Value Date   HGBA1C 5.8 (A) 05/23/2019   Pituitary Microadenoma: He has established with Dr. Honor Junes at [facility] and recent brain MRI showed no pituitary adenoma. Cabergoline has been discontinued.   Anxiety and Depression: Feeling isolated due to COVID. He is currently on Cymbalta 60 mg, Buspar 15 mg BID and hydroxyzine PRN. He doesn't feel the increased buspar has helped him much. Our Education officer, museum as attempted to make contact with him to provide mental health assistance and referral has been closed due to multiple unsuccessful contacts. Patient has a history of alcohol abuse currently in remission and was on klonopin previously but has since been tapered off. Requesting Klonopin today. He used to see Dr. Nicolasa Ducking and had been on 90 mg cymbalta daily but insurance changed and he stopped seeing her.   Reports he had an itchy rash pop up on his legs that he started taking left over Z-pak for and prednisone.    ------------------------------------------------------------------------------------   Allergies  Allergen Reactions  . Penicillins Anaphylaxis  . Tetanus Toxoids Swelling  . Lisinopril     Other reaction(s): Other (See Comments) Other reaction(s): Cough Other reaction(s): Cough Other reaction(s): Cough Other reaction(s): Cough  . Losartan     Other reaction(s): Other (See Comments) Other reaction(s): Muscle Pain Other reaction(s): Muscle Pain Other reaction(s): Muscle Pain  . Tetanus Toxoid   . Codeine Nausea Only  . Doxycycline Rash     Current Outpatient Medications:  .  amLODipine (NORVASC) 5 MG tablet, Take 1 tablet (5 mg total) by mouth daily., Disp: 90 tablet, Rfl: 3 .  Arginine 2000 MG PACK, Take by mouth daily., Disp: , Rfl:  .  atorvastatin (LIPITOR) 10 MG tablet, Take 1 tablet (10 mg total) by mouth at bedtime., Disp: 90 tablet, Rfl: 0 .  busPIRone (BUSPAR) 15 MG tablet, Take 1 tablet (15 mg total) by mouth 2 (two) times daily., Disp: 180 tablet, Rfl: 0 .  Carbonyl Iron (PERFECT IRON) 25 MG TABS, Take by mouth daily., Disp: , Rfl:  .  Coenzyme Q10 (COQ-10) 100 MG CAPS, Take by mouth daily., Disp: , Rfl:  .  colchicine 0.6 MG tablet, Take 2 tablets (1.2mg ) by mouth at first sign of gout flare followed by 1 tablet (0.6mg ) after 1 hour. (Max 1.8mg  within 1 hour), Disp: , Rfl:  .  Cyanocobalamin (B-12) 5000 MCG SUBL, Place under the tongue daily., Disp: , Rfl:  .  DHEA 50 MG CAPS, Take by mouth., Disp: , Rfl:  .  DULoxetine (CYMBALTA) 30 MG capsule, TAKE 2 CAPSULES BY MOUTH ONCE DAILY, Disp: 60 capsule, Rfl: 5 .  finasteride (PROSCAR) 5 MG tablet, Take 1 tablet (5 mg total) by mouth daily., Disp: 90 tablet, Rfl: 3 .  fluticasone (FLONASE) 50 MCG/ACT nasal spray, Place into the nose., Disp: , Rfl:  .  gabapentin (NEURONTIN) 100 MG capsule, , Disp: , Rfl:  .  Ginger, Zingiber officinalis, (GINGER PO), Take by mouth., Disp: , Rfl:  .  Glucosamine 500 MG CAPS, Take by  mouth daily., Disp: , Rfl:  .  hydrOXYzine (ATARAX/VISTARIL) 25 MG tablet, TAKE 1 TABLET BY MOUTH 3 TIMES DAILY AS NEEDED, Disp: 30 tablet, Rfl: 2 .  indomethacin (INDOCIN) 50 MG capsule, TAKE 1 CAPSULE BY MOUTH 3 TIMES PER DAY WITH MEALS, Disp: 90 capsule, Rfl: 5 .  loratadine (CLARITIN) 10 MG tablet, Take 10 mg by mouth daily. , Disp: , Rfl:  .  magnesium oxide (MAG-OX) 400 MG tablet, Take 400 mg by mouth daily., Disp: , Rfl:  .  metoprolol succinate (TOPROL-XL) 25 MG 24 hr tablet, TAKE 1 TABLET BY MOUTH ONCE DAILY., Disp: 30 tablet, Rfl: 5 .  montelukast (SINGULAIR) 10 MG tablet, Take 1 tablet (10 mg total) by mouth at bedtime., Disp: 30 tablet, Rfl: 5 .  naproxen sodium (ANAPROX) 220 MG tablet, Take 220 mg by mouth 2 (two) times daily with a meal., Disp: , Rfl:  .  Omega-3 Fatty Acids (FISH OIL) 1000 MG CAPS, Take by mouth., Disp: , Rfl:  .  OVER THE COUNTER MEDICATION, OREGA MAX, Disp: , Rfl:  .  OVER THE COUNTER MEDICATION, RED MARINE ALGAE-375 MG., Disp: , Rfl:  .  PROAIR HFA 108 (90 Base) MCG/ACT inhaler, INHALE 2 PUFFS BY MOUTH EVERY 6 HOURS ASNEEDED WHEEZING, Disp: 8.5 g, Rfl: 11 .  sildenafil (REVATIO) 20 MG tablet, 3-5 tablets daily as needed for erectile dysfunction, Disp: 30 tablet, Rfl: 3 .  tamsulosin (FLOMAX) 0.4 MG CAPS capsule, Take 1 capsule (0.4 mg total) by mouth daily., Disp: 90 capsule, Rfl: 3 .  testosterone cypionate (DEPOTESTOTERONE CYPIONATE) 100 MG/ML injection, Inject 1 mL (100 mg total) into the muscle once a week., Disp: 10 mL, Rfl: 1 .  traZODone (DESYREL) 100 MG tablet, trazodone 100 mg tablet, Disp: , Rfl:  .  triamcinolone (KENALOG) 0.025 % cream, , Disp: , Rfl:  .  cabergoline (DOSTINEX) 0.5 MG tablet, TAKE 0.5 TABLETS BY MOUTH TWICE A WEEK (Patient not taking: Reported on 04/10/2019), Disp: 10 tablet, Rfl: 2 .  Testosterone Cypionate 200 MG/ML SOLN, 0.63ml weekly IM Weekly or as instructed. 49 Ga. 33ml syringes, Disp: , Rfl:   Review of Systems  Constitutional:  Negative.   Respiratory: Negative.   Psychiatric/Behavioral: The patient is nervous/anxious.     Social History   Tobacco Use  . Smoking status: Light Tobacco Smoker    Types: E-cigarettes, Cigars  . Smokeless tobacco: Never Used  . Tobacco comment: " sometimes during the day and at night"  Substance Use Topics  . Alcohol use: Not Currently    Alcohol/week: 0.0 standard drinks    Comment: 12 pack a beer a night      Objective:   BP (!) 165/107 (BP Location: Left Arm, Patient Position: Sitting, Cuff Size: Large)   Pulse 92   Temp 98.2 F (36.8 C) (Oral)   Resp 16   Ht 6\' 2"  (1.88 m)  Wt 231 lb (104.8 kg)   SpO2 96%   BMI 29.66 kg/m  Vitals:   05/23/19 1611  BP: (!) 165/107  Pulse: 92  Resp: 16  Temp: 98.2 F (36.8 C)  TempSrc: Oral  SpO2: 96%  Weight: 231 lb (104.8 kg)  Height: 6\' 2"  (1.88 m)     Physical Exam   No results found for any visits on 05/23/19.     Assessment & Plan    1. Anxiety and depression  I think he would be better served by psychiatry. He has a long history of substance abuse and has not had much improvement on current regimen. He has not answered calls from social worker and doesn't express interest in contacting her to establish ongoing relationship.  - Ambulatory referral to Psychiatry  2. Prediabetes  - POCT glycosylated hemoglobin (Hb A1C)  3. Rash  Advised to stop using z-pak that is not appropriate.   - triamcinolone cream (KENALOG) 0.1 %; Apply 1 application topically 2 (two) times daily.  Dispense: 30 g; Refill: 0  4. Moderate episode of recurrent major depressive disorder ([place])  See #1.   5. Anterior pituitary disorder [facility])  He is no longer taking cabergoline, followed by Dr. Honor Junes at [facility].   6. HTN  Uncontrolled, but he is not taking BP meds as prescribed. Follow up in one month after he is taking his medications.  The entirety of the information documented in the History of Present  Illness, Review of Systems and Physical Exam were personally obtained by me. Portions of this information were initially documented by Lynford Humphrey, CMA and reviewed by me for thoroughness and accuracy.         Trinna Post, PA-C  [facility]

## 2019-05-23 NOTE — Patient Instructions (Signed)

## 2019-05-24 ENCOUNTER — Other Ambulatory Visit: Payer: Self-pay | Admitting: Physician Assistant

## 2019-05-24 DIAGNOSIS — F419 Anxiety disorder, unspecified: Secondary | ICD-10-CM

## 2019-05-25 DIAGNOSIS — F331 Major depressive disorder, recurrent, moderate: Secondary | ICD-10-CM | POA: Insufficient documentation

## 2019-06-12 ENCOUNTER — Other Ambulatory Visit: Payer: Self-pay | Admitting: Family Medicine

## 2019-06-13 MED ORDER — TESTOSTERONE CYPIONATE 100 MG/ML IM SOLN: 100.0000 mg | INTRAMUSCULAR | 0 refills | Status: DC

## 2019-06-20 NOTE — Progress Notes (Signed)
Called patient at 4:00 PM at scheduled appointment time. Patient was driving in the car and seemed very agitated and distracted with initial questions. 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 Was very angry with something that had happened earlier. I suggested we conduct visit at another point. He will call clinic back to reschedule appointment when he is able to devote his full attention.

## 2019-06-21 ENCOUNTER — Other Ambulatory Visit: Payer: Self-pay | Admitting: Physician Assistant

## 2019-06-21 DIAGNOSIS — F419 Anxiety disorder, unspecified: Secondary | ICD-10-CM

## 2019-06-22 ENCOUNTER — Other Ambulatory Visit: Payer: Self-pay

## 2019-06-22 ENCOUNTER — Ambulatory Visit: Payer: PRIVATE HEALTH INSURANCE | Admitting: Physician Assistant

## 2019-06-22 DIAGNOSIS — Z5321 Procedure and treatment not carried out due to patient leaving prior to being seen by health care provider: Secondary | ICD-10-CM

## 2019-06-23 NOTE — Telephone Encounter (Signed)
Pt called stated that he is out of busPIRone (BUSPAR) 15 MG tablet and is requesting it be sent to [facility]. Pt stated that he hasn't been able to get in with specialist until next month. Please advise. Thanks TNP

## 2019-07-10 ENCOUNTER — Other Ambulatory Visit: Payer: Self-pay | Admitting: Physician Assistant

## 2019-07-10 DIAGNOSIS — F419 Anxiety disorder, unspecified: Secondary | ICD-10-CM

## 2019-07-17 ENCOUNTER — Other Ambulatory Visit: Payer: Self-pay | Admitting: Family Medicine

## 2019-07-18 NOTE — Telephone Encounter (Signed)
I think he sees you now

## 2019-07-19 ENCOUNTER — Encounter: Payer: Self-pay | Admitting: Psychiatry

## 2019-07-19 ENCOUNTER — Other Ambulatory Visit: Payer: Self-pay

## 2019-07-19 ENCOUNTER — Ambulatory Visit (INDEPENDENT_AMBULATORY_CARE_PROVIDER_SITE_OTHER): Payer: PRIVATE HEALTH INSURANCE | Admitting: Psychiatry

## 2019-07-19 DIAGNOSIS — F410 Panic disorder [episodic paroxysmal anxiety] without agoraphobia: Secondary | ICD-10-CM | POA: Diagnosis not present

## 2019-07-19 DIAGNOSIS — F17210 Nicotine dependence, cigarettes, uncomplicated: Secondary | ICD-10-CM | POA: Insufficient documentation

## 2019-07-19 DIAGNOSIS — F4310 Post-traumatic stress disorder, unspecified: Secondary | ICD-10-CM | POA: Diagnosis not present

## 2019-07-19 DIAGNOSIS — F3160 Bipolar disorder, current episode mixed, unspecified: Secondary | ICD-10-CM | POA: Diagnosis not present

## 2019-07-19 DIAGNOSIS — F1021 Alcohol dependence, in remission: Secondary | ICD-10-CM

## 2019-07-19 DIAGNOSIS — F3161 Bipolar disorder, current episode mixed, mild: Secondary | ICD-10-CM | POA: Insufficient documentation

## 2019-07-19 DIAGNOSIS — F17200 Nicotine dependence, unspecified, uncomplicated: Secondary | ICD-10-CM

## 2019-07-19 DIAGNOSIS — Z87891 Personal history of nicotine dependence: Secondary | ICD-10-CM | POA: Insufficient documentation

## 2019-07-19 MED ORDER — HYDROXYZINE PAMOATE 25 MG PO CAPS: 25.0000 mg | ORAL_CAPSULE | Freq: Four times a day (QID) | ORAL | 1 refills | Status: DC | PRN

## 2019-07-19 MED ORDER — GABAPENTIN 300 MG PO CAPS: 300.0000 mg | ORAL_CAPSULE | Freq: Two times a day (BID) | ORAL | 0 refills | Status: DC

## 2019-07-19 MED ORDER — OLANZAPINE 5 MG PO TABS: 5.0000 mg | ORAL_TABLET | Freq: Every day | ORAL | 0 refills | Status: DC
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
## 2019-07-26 ENCOUNTER — Telehealth: Payer: Self-pay

## 2019-07-26 NOTE — Telephone Encounter (Signed)
Patient called requesting a prescription for a  muscle relaxer and tessalon Perles. He states he pulled a back muscle 2 days ago trying to move his refrigerator. He has had a dry cough for 1 week. Patient denies any other COVID symptoms. I advised patient that he would probably need a virtual visit. Patient requested that I send a message to see if you would prescribe these medications without an office visit. Please advise. Pharmacy: [facility]

## 2019-07-26 NOTE — Telephone Encounter (Signed)
Yes, please schedule visit.

## 2019-07-27 ENCOUNTER — Other Ambulatory Visit: Payer: Self-pay

## 2019-07-27 ENCOUNTER — Ambulatory Visit (INDEPENDENT_AMBULATORY_CARE_PROVIDER_SITE_OTHER): Payer: PRIVATE HEALTH INSURANCE | Admitting: Physician Assistant

## 2019-07-27 ENCOUNTER — Encounter: Payer: Self-pay | Admitting: Physician Assistant

## 2019-07-27 ENCOUNTER — Telehealth: Payer: Self-pay

## 2019-07-27 DIAGNOSIS — M791 Myalgia, unspecified site: Secondary | ICD-10-CM

## 2019-07-27 DIAGNOSIS — R05 Cough: Secondary | ICD-10-CM

## 2019-07-27 DIAGNOSIS — R059 Cough, unspecified: Secondary | ICD-10-CM

## 2019-07-27 DIAGNOSIS — K219 Gastro-esophageal reflux disease without esophagitis: Secondary | ICD-10-CM

## 2019-07-27 MED ORDER — FAMOTIDINE 20 MG PO TABS: 20.0000 mg | ORAL_TABLET | Freq: Two times a day (BID) | ORAL | 0 refills | Status: DC

## 2019-07-27 MED ORDER — OMEPRAZOLE 20 MG PO CPDR: 20.0000 mg | DELAYED_RELEASE_CAPSULE | Freq: Two times a day (BID) | ORAL | 2 refills | Status: DC

## 2019-07-27 MED ORDER — CYCLOBENZAPRINE HCL 5 MG PO TABS: 5.0000 mg | ORAL_TABLET | Freq: Every day | ORAL | 0 refills | Status: DC

## 2019-07-27 NOTE — Telephone Encounter (Signed)
Patient scheduled doxy.

## 2019-07-27 NOTE — Telephone Encounter (Signed)
Rx sent 

## 2019-07-27 NOTE — Telephone Encounter (Signed)
Yes omeprazole 20 mg BID would be fine, #60 with 2 refills.

## 2019-07-27 NOTE — Addendum Note (Signed)
Addended by: Julieta Bellini on: 07/27/2019 04:48 PM   Modules accepted: Orders

## 2019-07-27 NOTE — Telephone Encounter (Signed)
Pharmacist from [facility] called stating that Famotidine is on back order, and they want to know if Russell Simpson wants to call in a alternative like Omeprazole? Please call back.

## 2019-07-27 NOTE — Progress Notes (Signed)
   Subjective:    Patient ID: Russell Simpson, male    DOB: 1961/06/11, 58 y.o.   MRN: [id number]  Russell Simpson is a 58 y.o. male presenting on 07/27/2019 for Cough and Muscle Pain  Virtual Visit via Telephone Note  I connected with Russell Simpson on 07/27/19 at  3:40 PM EDT by telephone and verified that I am speaking with the correct person using two identifiers.   I discussed the limitations, risks, security and privacy concerns of performing an evaluation and management service by telephone and the availability of in person appointments. I also discussed with the patient that there may be a patient responsible charge related to this service. The patient expressed understanding and agreed to proceed.  Patient location: home Provider location: [place] office  Persons involved in the visit: patient, provider   HPI   Reports he has sore muscles in his back and has been doing heat and ice with mild relief. Hurt himself on Monday moving a refrigerator by himself. He describes it as a burning sensation in his lower back. Pain is worse when he gets up. Pain will be OK when he stands up.   Cough: dry cough at night, He endorses heart burn. This is worse at night. He is taking pepto bismol  Denies wheezing, SOB, fevers, chills.    Social History   Tobacco Use  . Smoking status: Light Tobacco Smoker    Types: E-cigarettes, Cigars  . Smokeless tobacco: Never Used  . Tobacco comment: " sometimes during the day and at night"  Substance Use Topics  . Alcohol use: Not Currently    Alcohol/week: 0.0 standard drinks    Comment: 12 pack a beer a night  . Drug use: No    Review of Systems Per HPI unless specifically indicated above     Objective:    There were no vitals taken for this visit.  Wt Readings from Last 3 Encounters:  05/23/19 231 lb (104.8 kg)  04/10/19 220 lb (99.8 kg)  02/14/19 223 lb (101.2 kg)    Physical Exam Results for orders placed or  performed in visit on 05/23/19  POCT glycosylated hemoglobin (Hb A1C)  Result Value Ref Range   Hemoglobin A1C 5.8 (A) 4.0 - 5.6 %   Est. average glucose Bld gHb Est-mCnc 120       Assessment & Plan:  1. Cough  I think this is due to heartburn. Have counseled to avoid large meals late at night and spicy foods. He can take pepcid as below.   - famotidine (PEPCID) 20 MG tablet; Take 1 tablet (20 mg total) by mouth 2 (two) times daily.  Dispense: 180 tablet; Refill: 0  2. Muscle pain  Counseled this is self limiting but will give muscle relaxer as below.  - cyclobenzaprine (FLEXERIL) 5 MG tablet; Take 1 tablet (5 mg total) by mouth at bedtime.  Dispense: 30 tablet; Refill: 0  3. Gastroesophageal reflux disease, esophagitis presence not specified  - famotidine (PEPCID) 20 MG tablet; Take 1 tablet (20 mg total) by mouth 2 (two) times daily.  Dispense: 180 tablet; Refill: 0    Follow up plan: Return if symptoms worsen or fail to improve.  Carles Collet, PA-C [facility] 07/27/2019, 4:18 PM

## 2019-07-27 NOTE — Patient Instructions (Signed)
Esophagitis  Esophagitis is inflammation of the esophagus. The esophagus is the tube that carries food from your mouth to your stomach. Esophagitis can cause soreness or pain in the esophagus. This condition can make it difficult and painful to swallow. What are the causes? Most causes of esophagitis are not serious. Common causes of this condition include:  Gastroesophageal reflux disease (GERD). This is when stomach contents move back up into the esophagus (reflux).  Repeated vomiting.  An allergic reaction, especially caused by food allergies (eosinophilic esophagitis).  Injury to the esophagus by swallowing large pills with or without water, or swallowing certain types of medicines.  Swallowing (ingesting) harmful chemicals, such as household cleaning products.  Heavy alcohol use.  An infection of the esophagus. This most often occurs in people who have a weakened immune system.  Radiation or chemotherapy treatment for cancer.  Certain diseases such as sarcoidosis, Crohn's disease, and scleroderma. What are the signs or symptoms? Symptoms of this condition include:  Difficult or painful swallowing.  Pain with swallowing acidic liquids, such as citrus juices.  Pain with burping.  Chest pain.  Difficulty breathing.  Nausea.  Vomiting.  Pain in the abdomen.  Weight loss.  Ulcers in the mouth.  Patches of white material in the mouth (candidiasis).  Fever.  Coughing up blood or vomiting blood.  Stool that is black, tarry, or bright red. How is this diagnosed? Your health care provider will take a medical history and perform a physical exam. You may also have other tests, including:  An endoscopy to examine your esophagus and stomach with a small flexible tube with a camera.  A test that measures the acidity level in your esophagus.  A test that measures how much pressure is on your esophagus.  A barium swallow or modified barium swallow to show the shape,  size, and functioning of your esophagus.  Allergy tests. How is this treated? Treatment for this condition depends on the cause of your esophagitis. In some cases, steroids or other medicines may be given to help relieve your symptoms or to treat the underlying cause of your condition. You may have to make some lifestyle changes, such as:  Avoiding alcohol.  Quitting smoking.  Changing your diet.  Exercising.  Changing your sleep habits and your sleep environment. Follow these instructions at home: Medicines  Take over-the-counter and prescription medicines only as told by your health care provider.  Do not take aspirin, ibuprofen, or other NSAIDs unless your health care provider told you to do so.  If you have trouble taking pills: ? Use a pill splitter to decrease the size of the pill. This will decrease the chance of the pill getting stuck or injuring your esophagus. ? Drink water after you take a pill. Eating and drinking   Avoid foods and drinks that seem to make your symptoms worse.  Follow a diet as recommended by your health care provider. This may involve avoiding foods and drinks such as: ? Coffee and tea (with or without caffeine). ? Drinks that contain alcohol. ? Energy drinks and sports drinks. ? Carbonated drinks or sodas. ? Chocolate and cocoa. ? Peppermint and mint flavorings. ? Garlic and onions. ? Horseradish. ? Spicy and acidic foods, including peppers, chili powder, curry powder, vinegar, hot sauces, and barbecue sauce. ? Citrus fruit juices and citrus fruits, such as oranges, lemons, and limes. ? Tomato-based foods, such as red sauce, chili, salsa, and pizza with red sauce. ? Fried and fatty foods, such as   donuts, french fries, potato chips, and high-fat dressings. ? High-fat meats, such as hot dogs and fatty cuts of red and white meats, such as rib eye steak, sausage, ham, and bacon. ? High-fat dairy items, such as whole milk, butter, and cream  cheese. Lifestyle  Eat small, frequent meals instead of large meals.  Avoid drinking large amounts of liquid with your meals.  Avoid eating meals during the 2-3 hours before bedtime.  Avoid lying down right after you eat.  Do not exercise right after you eat.  Do not use any products that contain nicotine or tobacco, such as cigarettes and e-cigarettes. If you need help quitting, ask your health care provider. General instructions   Pay attention to any changes in your symptoms. Let your health care provider know about them.  Wear loose-fitting clothing. Do not wear anything tight around your waist that causes pressure on your abdomen.  Raise (elevate) the head of your bed about 6 inches (15 cm).  Try relaxation strategies such as yoga, deep breathing, or meditation to manage stress. If you need help reducing stress, ask your health care provider.  If you are overweight, reduce your weight to an amount that is healthy for you. Ask your health care provider for guidance about a safe weight loss goal.  Keep all follow-up visits as told by your health care provider. This is important. Contact a health care provider if:  You have new symptoms.  You have unexplained weight loss.  You have difficulty swallowing, or it hurts to swallow.  You have wheezing or a cough that does not go away.  Your symptoms do not improve with treatment.  You have frequent heartburn for more than two weeks. Get help right away if:  You have severe pain in your arms, neck, jaw, teeth, or back.  You feel sweaty, dizzy, or light-headed.  You have chest pain or shortness of breath.  You vomit and your vomit looks like blood or coffee grounds.  Your stool is bloody or black.  You have a fever.  You cannot swallow, drink, or eat. Summary  Esophagitis is inflammation of the esophagus.  Most causes of esophagitis are not serious.  Follow your health care provider's instructions about eating  and drinking. Follow instructions on medicines.  Contact a health care provider if you have new symptoms, have weight loss, or coughing that does not stop.  Get help right away if you have severe pain in the arms, neck, jaw, teeth or back, or if you have chest pain, shortness of breath, or fever. This information is not intended to replace advice given to you by your health care provider. Make sure you discuss any questions you have with your health care provider. Document Released: 12/24/2004 Document Revised: 07/08/2018 Document Reviewed: 07/08/2018 Elsevier Patient Education  2020 [employer]  

## 2019-07-31 ENCOUNTER — Telehealth: Payer: Self-pay

## 2019-07-31 DIAGNOSIS — R059 Cough, unspecified: Secondary | ICD-10-CM

## 2019-07-31 DIAGNOSIS — R05 Cough: Secondary | ICD-10-CM

## 2019-07-31 MED ORDER — BENZONATATE 100 MG PO CAPS: 100.0000 mg | ORAL_CAPSULE | Freq: Three times a day (TID) | ORAL | 0 refills | Status: AC | PRN

## 2019-07-31 NOTE — Telephone Encounter (Signed)
I do still think it is GERD. It could also be allergies and he may try allegra or zyrtec. I sent in Russell Simpson.

## 2019-07-31 NOTE — Telephone Encounter (Signed)
Patient states the he still having the dry nagging cough with use of cough medication and elevation. He is unable to rest at night. Please advise

## 2019-08-01 NOTE — Telephone Encounter (Signed)
Patient advised as below. Patient verbalizes understanding and is in agreement with treatment plan.  

## 2019-08-10 ENCOUNTER — Ambulatory Visit (INDEPENDENT_AMBULATORY_CARE_PROVIDER_SITE_OTHER): Payer: PRIVATE HEALTH INSURANCE | Admitting: Urology

## 2019-08-10 ENCOUNTER — Encounter: Payer: Self-pay | Admitting: Urology

## 2019-08-10 ENCOUNTER — Other Ambulatory Visit: Payer: Self-pay

## 2019-08-10 VITALS — BP 148/91 | HR 109 | Ht 74.0 in | Wt 231.0 lb

## 2019-08-10 DIAGNOSIS — N5201 Erectile dysfunction due to arterial insufficiency: Secondary | ICD-10-CM | POA: Diagnosis not present

## 2019-08-10 DIAGNOSIS — E291 Testicular hypofunction: Secondary | ICD-10-CM | POA: Diagnosis not present

## 2019-08-10 DIAGNOSIS — N401 Enlarged prostate with lower urinary tract symptoms: Secondary | ICD-10-CM

## 2019-08-10 MED ORDER — SILDENAFIL CITRATE 20 MG PO TABS: ORAL_TABLET | ORAL | 3 refills | Status: DC

## 2019-08-10 MED ORDER — TAMSULOSIN HCL 0.4 MG PO CAPS: 0.4000 mg | ORAL_CAPSULE | Freq: Every day | ORAL | 3 refills | Status: DC

## 2019-08-10 MED ORDER — TESTOSTERONE CYPIONATE 200 MG/ML IM SOLN: 100.0000 mg | INTRAMUSCULAR | 0 refills | Status: DC

## 2019-08-10 NOTE — Progress Notes (Signed)
08/10/2019 9:39 PM   Russell Simpson 01-22-1961 [id number]  Referring provider: Jerrol Banana., MD [address] [place],  [place] [address]  Chief Complaint  Patient presents with  . Hypogonadism    Urologic history:  1.  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  Erectile dysfunction  -On sildenafil  3.  BPH with lower urinary tract symptoms  -Combination therapy finasteride/tamsulosin    HPI: 58 y.o. male presents for annual follow-up of hypogonadism.  He notes good energy level and libido.  He injects every Friday.  41-month interim blood work looked good with a testosterone level of 726 and a normal hematocrit.  Denies breast tenderness/enlargement or lower extremity edema.  He has no bothersome lower urinary tract symptoms.  Last PSA was May 2019 and was 1.81 (uncorrected).   PMH: Past Medical History:  Diagnosis Date  . 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 Arthritis   . Asthma   . 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 Chronic pain    right arm  . Depression   . Environmental and seasonal allergies   . Hypertension   . Pneumonia   . Sleep apnea    does not wear CPAP ; uses humidifier instead    Surgical History: Past Surgical History:  Procedure Laterality Date  . ANTERIOR CERVICAL DECOMP/DISCECTOMY FUSION N/A 06/17/2016   Procedure: ANTERIOR CERVICAL DECOMPRESSION FUSION, CERVICAL 3-4, CERVICAL 4-5 WITH INSTRUMENTATION AND ALLOGRAFT;  Surgeon: Phylliss Bob, MD;  Location: [facility];  Service: Orthopedics;  Laterality: N/A;  ANTERIOR CERVICAL DECOMPRESSION FUSION, CERVICAL 3-4, CERVICAL 4-5 WITH INSTRUMENTATION AND ALLOGRAFT  . BACK SURGERY    . NECK SURGERY  2009    Home Medications:  Allergies as of 08/10/2019      Reactions   Penicillins Anaphylaxis   Tetanus Toxoids Swelling   Lisinopril    Other  reaction(s): Other (See Comments) Other reaction(s): Cough Other reaction(s): Cough Other reaction(s): Cough Other reaction(s): Cough   Losartan    Other reaction(s): Other (See Comments) Other reaction(s): Muscle Pain Other reaction(s): Muscle Pain Other reaction(s): Muscle Pain   Tetanus Toxoid    Codeine Nausea Only   Doxycycline Rash      Medication List       Accurate as of August 10, 2019  9:39 PM. If you have any questions, ask your nurse or doctor.        amLODipine 5 MG tablet Commonly known as: NORVASC Take 1 tablet (5 mg total) by mouth daily.   Arginine 2000 MG Pack Take by mouth daily.   atorvastatin 10 MG tablet Commonly known as: LIPITOR Take 1 tablet (10 mg total) by mouth at bedtime.   B-12 5000 MCG Subl Place under the tongue daily.   colchicine 0.6 MG tablet Take 2 tablets (1.2mg ) by mouth at first sign of gout flare followed by 1 tablet (0.6mg ) after 1 hour. (Max 1.8mg  within 1 hour)   CoQ-10 100 MG Caps Take by mouth daily.   cyclobenzaprine 5 MG tablet Commonly known as: FLEXERIL Take 1 tablet (5 mg total) by mouth at bedtime.   DHEA 50 MG Caps Take by mouth.   DULoxetine 30 MG capsule Commonly known as: CYMBALTA TAKE 2 CAPSULES BY MOUTH ONCE DAILY   famotidine 20 MG tablet Commonly known as: Pepcid Take 1 tablet (20 mg total) by mouth 2 (two) times  daily.   finasteride 5 MG tablet Commonly known as: PROSCAR Take 1 tablet (5 mg total) by mouth daily.   Fish Oil 1000 MG Caps Take by mouth.   fluticasone 50 MCG/ACT nasal spray Commonly known as: FLONASE Place into the nose.   gabapentin 300 MG capsule Commonly known as: NEURONTIN Take 1 capsule (300 mg total) by mouth 2 (two) times daily.   GINGER PO Take by mouth.   Glucosamine 500 MG Caps Take by mouth daily.   hydrOXYzine 25 MG capsule Commonly known as: Vistaril Take 1 capsule (25 mg total) by mouth 4 (four) times daily as needed.   hydrOXYzine 25 MG tablet  Commonly known as: ATARAX/VISTARIL   indomethacin 50 MG capsule Commonly known as: INDOCIN TAKE 1 CAPSULE BY MOUTH 3 TIMES PER DAY WITH MEALS   loratadine 10 MG tablet Commonly known as: CLARITIN Take 10 mg by mouth daily.   magnesium oxide 400 MG tablet Commonly known as: MAG-OX Take 400 mg by mouth daily.   metoprolol succinate 25 MG 24 hr tablet Commonly known as: TOPROL-XL TAKE 1 TABLET BY MOUTH ONCE DAILY.   montelukast 10 MG tablet Commonly known as: SINGULAIR Take 1 tablet (10 mg total) by mouth at bedtime.   naproxen sodium 220 MG tablet Commonly known as: ALEVE Take 220 mg by mouth 2 (two) times daily with a meal.   OLANZapine 5 MG tablet Commonly known as: ZyPREXA Take 1 tablet (5 mg total) by mouth at bedtime.   omeprazole 20 MG capsule Commonly known as: PRILOSEC Take 1 capsule (20 mg total) by mouth 2 (two) times daily before a meal.   OVER THE COUNTER MEDICATION OREGA MAX   OVER THE COUNTER MEDICATION RED MARINE ALGAE-375 MG.   Perfect Iron 25 MG Tabs Generic drug: Carbonyl Iron Take by mouth daily.   ProAir HFA 108 (90 Base) MCG/ACT inhaler Generic drug: albuterol INHALE 2 PUFFS BY MOUTH EVERY 6 HOURS ASNEEDED WHEEZING   sildenafil 20 MG tablet Commonly known as: REVATIO 3-5 tablets daily as needed for erectile dysfunction   tamsulosin 0.4 MG Caps capsule Commonly known as: FLOMAX Take 1 capsule (0.4 mg total) by mouth daily.   testosterone cypionate 200 MG/ML injection Commonly known as: DEPOTESTOSTERONE CYPIONATE   triamcinolone cream 0.1 % Commonly known as: KENALOG Apply 1 application topically 2 (two) times daily.       Allergies:  Allergies  Allergen Reactions  . Penicillins Anaphylaxis  . Tetanus Toxoids Swelling  . Lisinopril     Other reaction(s): Other (See Comments) Other reaction(s): Cough Other reaction(s): Cough Other reaction(s): Cough Other reaction(s): Cough  . Losartan     Other reaction(s): Other (See  Comments) Other reaction(s): Muscle Pain Other reaction(s): Muscle Pain Other reaction(s): Muscle Pain  . Tetanus Toxoid   . Codeine Nausea Only  . Doxycycline Rash    Family History: Family History  Problem Relation Age of Onset  . Diabetes Mother   . Diabetes Other     Social History:  reports that he has been smoking e-cigarettes and cigars. He has never used smokeless tobacco. He reports previous alcohol use. He reports that he does not use drugs.  ROS: UROLOGY Frequent Urination?: No Hard to postpone urination?: No Burning/pain with urination?: No Get up at night to urinate?: No Leakage of urine?: No Urine stream starts and stops?: No Trouble starting stream?: No Do you have to strain to urinate?: No Blood in urine?: No Urinary tract infection?: No Sexually transmitted disease?:  No Injury to kidneys or bladder?: No Painful intercourse?: No Weak stream?: No Erection problems?: No Penile pain?: No  Gastrointestinal Nausea?: No Vomiting?: No Indigestion/heartburn?: Yes Diarrhea?: No Constipation?: No  Constitutional Fever: No Night sweats?: Yes Weight loss?: No Fatigue?: No  Skin Skin rash/lesions?: No Itching?: No  Eyes Blurred vision?: Yes Double vision?: No  Ears/Nose/Throat Sore throat?: No Sinus problems?: No  Hematologic/Lymphatic Swollen glands?: No Easy bruising?: No  Cardiovascular Leg swelling?: No Chest pain?: No  Respiratory Cough?: Yes Shortness of breath?: No  Endocrine Excessive thirst?: No  Musculoskeletal Back pain?: No Joint pain?: No  Neurological Headaches?: No Dizziness?: No  Psychologic Depression?: Yes Anxiety?: Yes  Physical Exam: BP (!) 148/91 (BP Location: Left Arm, Patient Position: Sitting, Cuff Size: Normal)   Pulse (!) 109   Ht 6\' 2"  (1.88 m)   Wt 231 lb (104.8 kg)   BMI 29.66 kg/m   Constitutional:  Alert and oriented, No acute distress. HEENT: [place] AT, moist mucus membranes.  Trachea  midline, no masses. Cardiovascular: No clubbing, cyanosis, or edema. Respiratory: Normal respiratory effort, no increased work of breathing. GI: Abdomen is soft, nontender, nondistended, no abdominal masses GU: No CVA tenderness.  Prostate 40 g, smooth without nodules Lymph: No cervical or inguinal lymphadenopathy. Skin: No rashes, bruises or suspicious lesions. Neurologic: Grossly intact, no focal deficits, moving all 4 extremities. Psychiatric: Normal mood and affect.   Assessment & Plan:    - Hypogonadotrophic hypogonadism Stable symptoms on TRT.  Testosterone, PSA and hematocrit were ordered and he will be notified with results.  If stable follow-up 6 months for testosterone/hematocrit and 1 year for office visit/lab work.  Testosterone was refilled.  - Erectile dysfunction Stable on sildenafil which was refilled.  - BPH with lower urinary tract symptoms Stable voiding symptoms on combination therapy.  Tamsulosin/finasteride were refilled.   Return in about 1 year (around 08/09/2020) for Recheck, PSA, testosterone, hematocrit.   Abbie Sons, [facility] [address] [place], [place] [address] [phone]

## 2019-08-11 ENCOUNTER — Ambulatory Visit (INDEPENDENT_AMBULATORY_CARE_PROVIDER_SITE_OTHER): Payer: PRIVATE HEALTH INSURANCE | Admitting: Licensed Clinical Social Worker

## 2019-08-11 ENCOUNTER — Encounter: Payer: Self-pay | Admitting: Licensed Clinical Social Worker

## 2019-08-11 ENCOUNTER — Telehealth: Payer: Self-pay | Admitting: Urology

## 2019-08-11 DIAGNOSIS — F4310 Post-traumatic stress disorder, unspecified: Secondary | ICD-10-CM

## 2019-08-11 DIAGNOSIS — F3160 Bipolar disorder, current episode mixed, unspecified: Secondary | ICD-10-CM

## 2019-08-11 LAB — HEMATOCRIT: Hematocrit: 49.8 % (ref 37.5–51.0)

## 2019-08-11 LAB — TESTOSTERONE: Testosterone: 439 ng/dL (ref 264–916)

## 2019-08-11 LAB — PSA: Prostate Specific Ag, Serum: 1.9 ng/mL (ref 0.0–4.0)
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
## 2019-08-11 NOTE — Telephone Encounter (Signed)
Spoke with Sam, pharmacist clarified instructions and dosage.

## 2019-08-11 NOTE — Telephone Encounter (Signed)
Pharmacist asking if new testosterone Rx is correct because it is at a lower dose than the pt was getting. Please clarify. [phone] ask for pharmacist please. Thanks.

## 2019-08-14 ENCOUNTER — Telehealth: Payer: Self-pay | Admitting: *Deleted

## 2019-08-14 NOTE — Telephone Encounter (Addendum)
Patient informed-verbalized understanding.  ----- Message from Abbie Sons, MD sent at 08/12/2019 12:46 PM EDT ----- PSA stable at 1.9, testosterone level was 439 and hematocrit normal at 49.8

## 2019-08-22 ENCOUNTER — Other Ambulatory Visit: Payer: Self-pay

## 2019-08-22 ENCOUNTER — Ambulatory Visit (INDEPENDENT_AMBULATORY_CARE_PROVIDER_SITE_OTHER): Payer: PRIVATE HEALTH INSURANCE | Admitting: Psychiatry

## 2019-08-22 ENCOUNTER — Encounter: Payer: Self-pay | Admitting: Psychiatry

## 2019-08-22 DIAGNOSIS — F4310 Post-traumatic stress disorder, unspecified: Secondary | ICD-10-CM

## 2019-08-22 DIAGNOSIS — F410 Panic disorder [episodic paroxysmal anxiety] without agoraphobia: Secondary | ICD-10-CM

## 2019-08-22 DIAGNOSIS — F17200 Nicotine dependence, unspecified, uncomplicated: Secondary | ICD-10-CM

## 2019-08-22 DIAGNOSIS — F3161 Bipolar disorder, current episode mixed, mild: Secondary | ICD-10-CM

## 2019-08-22 MED ORDER — VARENICLINE TARTRATE 0.5 MG PO TABS: 0.5000 mg | ORAL_TABLET | Freq: Two times a day (BID) | ORAL | 0 refills | Status: DC

## 2019-08-22 NOTE — Progress Notes (Signed)
Virtual Visit via Video Note  I connected with Russell Simpson on 08/22/19 at  2:00 PM EDT by a video enabled telemedicine application and verified that I am speaking with the correct person using two identifiers.   I discussed the limitations of evaluation and management by telemedicine and the availability of in person appointments. The patient expressed understanding and agreed to proceed.   I discussed the assessment and treatment plan with the patient. The patient was provided an opportunity to ask questions and all were answered. The patient agreed with the plan and demonstrated an understanding of the instructions.   The patient was advised to call back or seek an in-person evaluation if the symptoms worsen or if the condition fails to improve as anticipated.   [facility] MD OP Progress Note  08/22/2019 5:53 PM Russell Simpson  MRN:  [id number]  Chief Complaint:  Chief Complaint    Follow-up     HPI: Russell Simpson is a 58 year old Caucasian male, divorced, unemployed, lives in [place], has a history of bipolar disorder, PTSD, panic attacks, hypertension, prediabetic, multiple back surgeries, history of pituitary adenoma, chronic pain was evaluated by telemedicine today.  Patient had difficulty with connection problem and during the session it had to be changed to a phone call.  Patient today reports he has noticed a huge difference with the Zyprexa augmentation.  He is tolerating the medication well.  He denies side effects.  He reports sleep is improved.  He however reports he had a sleep study done and is awaiting results.  Patient reports his mood symptoms are currently more stable.  He denies any significant panic attacks.  Patient continues to work with Ms. Alden Hipp his therapist.  Patient reports he continues to smoke cigarettes.  He reports he is willing to quit.  Discussed Chantix.  Reviewed and discussed medical records received from his previous psychiatrist Dr. Nicolasa Ducking dated  January 02, 2015, June 2013. 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 daily, trazodone 200 mg, hydroxyzine 50 mg p.o. twice daily for panic attacks.  For problems with memory problems probably due to long-term alcohol use and TBI-patient was seen by Dr. Manuella Ghazi from neurology at [facility] clinic but no recommendations have been made.'   Visit Diagnosis:    ICD-10-CM   1. Bipolar 1 disorder, mixed, mild (HCC)  F31.61   2. PTSD (post-traumatic stress disorder)  F43.10   3. Panic disorder  F41.0   4. Tobacco use disorder  F17.200 varenicline (CHANTIX) 0.5 MG tablet    Past Psychiatric History: I have reviewed past psychiatric history from my progress note on 07/19/2019.  Past trials of Cymbalta, BuSpar, gabapentin, trazodone, melatonin-nightmares  Past Medical History:  Past Medical History:  Diagnosis Date  . 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 Arthritis   . Asthma   . Brain tumor (benign) ([place])    benign pituitary neoplasm  . Chronic pain    right arm  . Depression   . Environmental and seasonal allergies   . Hypertension   . Pneumonia   . Sleep apnea    does not wear CPAP ; uses humidifier instead    Past Surgical History:  Procedure Laterality Date  . ANTERIOR CERVICAL DECOMP/DISCECTOMY FUSION N/A 06/17/2016   Procedure: ANTERIOR CERVICAL DECOMPRESSION FUSION, CERVICAL 3-4, CERVICAL 4-5 WITH INSTRUMENTATION AND ALLOGRAFT;  Surgeon: Phylliss Bob, MD;  Location: [facility];  Service: Orthopedics;  Laterality: N/A;  ANTERIOR CERVICAL DECOMPRESSION FUSION, CERVICAL 3-4, CERVICAL 4-5 WITH INSTRUMENTATION AND ALLOGRAFT  . BACK SURGERY    . NECK SURGERY  2009    Family Psychiatric History: I have reviewed family psychiatric history from my progress note on 07/19/2019  Family History:  Family History  Problem Relation Age of Onset  . Diabetes Mother   . Diabetes Other     Social  History: I have reviewed social history from my progress note on 07/19/2019 Social History   Socioeconomic History  . Marital status: Divorced    Spouse name: Not on file  . Number of children: Not on file  . Years of education: Not on file  . Highest education level: Not on file  Occupational History  . Not on file  Social Needs  . Financial resource strain: Not on file  . Food insecurity    Worry: Not on file    Inability: Not on file  . Transportation needs    Medical: Not on file    Non-medical: Not on file  Tobacco Use  . Smoking status: Light Tobacco Smoker    Types: E-cigarettes, Cigars  . Smokeless tobacco: Never Used  . Tobacco comment: " sometimes during the day and at night"  Substance and Sexual Activity  . Alcohol use: Not Currently    Alcohol/week: 0.0 standard drinks    Comment: 12 pack a beer a night  . Drug use: No  . Sexual activity: Not Currently  Lifestyle  . Physical activity    Days per week: 1 day    Minutes per session: 10 min  . Stress: Rather much  Relationships  . Social Herbalist on phone: Twice a week    Gets together: Once a week    Attends religious service: Never    Active member of club or organization: No    Attends meetings of clubs or organizations: Never    Relationship status: Divorced  Other Topics Concern  . Not on file  Social History Narrative  . Not on file    Allergies:  Allergies  Allergen Reactions  . Penicillins Anaphylaxis  . Tetanus Toxoids Swelling  . Lisinopril     Other reaction(s): Other (See Comments) Other reaction(s): Cough Other reaction(s): Cough Other reaction(s): Cough Other reaction(s): Cough  . Losartan     Other reaction(s): Other (See Comments) Other reaction(s): Muscle Pain Other reaction(s): Muscle Pain Other reaction(s): Muscle Pain  . Tetanus Toxoid   . Codeine Nausea Only  . Doxycycline Rash    Metabolic Disorder Labs: Lab Results  Component Value Date   HGBA1C 5.8  (A) 05/23/2019   Lab Results  Component Value Date   PROLACTIN 1.3 (L) 12/02/2018   PROLACTIN <1.0 (L) 10/15/2017   Lab Results  Component Value Date   CHOL 255 (H) 02/14/2019   TRIG 155 (H) 02/14/2019   HDL 53 02/14/2019   CHOLHDL 4.8 02/14/2019   LDLCALC 171 (H) 02/14/2019   LDLCALC 108 (H) 10/15/2017   Lab Results  Component Value Date   TSH 3.800 02/14/2019   TSH 3.39 10/15/2017    Therapeutic Level Labs: No results found for: LITHIUM No results found for: VALPROATE No components found for:  CBMZ  Current Medications: Current Outpatient Medications  Medication Sig Dispense Refill  . amLODipine (NORVASC) 5 MG tablet Take 1 tablet (5 mg total) by mouth daily. 90 tablet 3  . Arginine 2000 MG PACK Take by mouth daily.    [name] atorvastatin (LIPITOR)  10 MG tablet Take 1 tablet (10 mg total) by mouth at bedtime. 90 tablet 0  . Carbonyl Iron (PERFECT IRON) 25 MG TABS Take by mouth daily.    . Coenzyme Q10 (COQ-10) 100 MG CAPS Take by mouth daily.    . colchicine 0.6 MG tablet Take 2 tablets (1.2mg ) by mouth at first sign of gout flare followed by 1 tablet (0.6mg ) after 1 hour. (Max 1.8mg  within 1 hour)    . Cyanocobalamin (B-12) 5000 MCG SUBL Place under the tongue daily.    . cyclobenzaprine (FLEXERIL) 5 MG tablet Take 1 tablet (5 mg total) by mouth at bedtime. 30 tablet 0  . DHEA 50 MG CAPS Take by mouth.    . DULoxetine (CYMBALTA) 30 MG capsule TAKE 2 CAPSULES BY MOUTH ONCE DAILY 60 capsule 5  . famotidine (PEPCID) 20 MG tablet Take 1 tablet (20 mg total) by mouth 2 (two) times daily. 180 tablet 0  . finasteride (PROSCAR) 5 MG tablet Take 1 tablet (5 mg total) by mouth daily. 90 tablet 3  . fluticasone (FLONASE) 50 MCG/ACT nasal spray Place into the nose.    . gabapentin (NEURONTIN) 300 MG capsule Take 1 capsule (300 mg total) by mouth 2 (two) times daily. 180 capsule 0  . Ginger, Zingiber officinalis, (GINGER PO) Take by mouth.    . Glucosamine 500 MG CAPS Take by mouth  daily.    . hydrOXYzine (ATARAX/VISTARIL) 25 MG tablet     . hydrOXYzine (VISTARIL) 25 MG capsule Take 1 capsule (25 mg total) by mouth 4 (four) times daily as needed. 120 capsule 1  . indomethacin (INDOCIN) 50 MG capsule TAKE 1 CAPSULE BY MOUTH 3 TIMES PER DAY WITH MEALS 90 capsule 5  . loratadine (CLARITIN) 10 MG tablet Take 10 mg by mouth daily.     . magnesium oxide (MAG-OX) 400 MG tablet Take 400 mg by mouth daily.    . metoprolol succinate (TOPROL-XL) 25 MG 24 hr tablet TAKE 1 TABLET BY MOUTH ONCE DAILY. 30 tablet 5  . montelukast (SINGULAIR) 10 MG tablet Take 1 tablet (10 mg total) by mouth at bedtime. 30 tablet 5  . naproxen sodium (ANAPROX) 220 MG tablet Take 220 mg by mouth 2 (two) times daily with a meal.    . OLANZapine (ZYPREXA) 5 MG tablet Take 1 tablet (5 mg total) by mouth at bedtime. 90 tablet 0  . Omega-3 Fatty Acids (FISH OIL) 1000 MG CAPS Take by mouth.    [name] omeprazole (PRILOSEC) 20 MG capsule Take 1 capsule (20 mg total) by mouth 2 (two) times daily before a meal. 60 capsule 2  . OVER THE COUNTER MEDICATION OREGA MAX    . OVER THE COUNTER MEDICATION RED MARINE ALGAE-375 MG.    Abe People HFA 108 (90 Base) MCG/ACT inhaler INHALE 2 PUFFS BY MOUTH EVERY 6 HOURS ASNEEDED WHEEZING 8.5 g 11  . sildenafil (REVATIO) 20 MG tablet 3-5 tablets daily as needed for erectile dysfunction 30 tablet 3  . tamsulosin (FLOMAX) 0.4 MG CAPS capsule Take 1 capsule (0.4 mg total) by mouth daily. 90 capsule 3  . testosterone cypionate (DEPOTESTOSTERONE CYPIONATE) 200 MG/ML injection Inject 0.5 mLs (100 mg total) into the muscle once a week. 10 mL 0  . triamcinolone cream (KENALOG) 0.1 % Apply 1 application topically 2 (two) times daily. 30 g 0  . varenicline (CHANTIX) 0.5 MG tablet Take 1 tablet (0.5 mg total) by mouth 2 (two) times daily. Start 1  tablet for 1  week and increase to 2 tablets daily 60 tablet 0   No current facility-administered medications for this visit.       Musculoskeletal: Strength & Muscle Tone: UTA Gait & Station: Reports as WNL Patient leans: N/A  Psychiatric Specialty Exam: Review of Systems  Psychiatric/Behavioral: Negative for suicidal ideas. The patient is not nervous/anxious.   All other systems reviewed and are negative.   There were no vitals taken for this visit.There is no height or weight on file to calculate BMI.  General Appearance: UTA  Eye Contact:  UTA  Speech:  Clear and Coherent  Volume:  Normal  Mood:  Euthymic  Affect:  UTA  Thought Process:  Goal Directed and Descriptions of Associations: Intact  Orientation:  Full (Time, Place, and Person)  Thought Content: Logical   Suicidal Thoughts:  No  Homicidal Thoughts:  No  Memory:  Immediate;   Fair Recent;   Fair Remote;   Fair  Judgement:  Fair  Insight:  Fair  Psychomotor Activity:  UTA  Concentration:  Concentration: Fair and Attention Span: Fair  Recall:  [employer] of Knowledge: Fair  Language: Fair  Akathisia:  No  Handed:  Right  AIMS (if indicated): Denies tremors, rigidity, stiffness  Assets:  Communication Skills Desire for Improvement Housing  ADL's:  Intact  Cognition: WNL  Sleep:  Fair   Screenings: PHQ2-9     Chronic Care Management from 03/27/2019 in [facility] Visit from 02/14/2019 in [facility] Visit from 12/29/2018 in [facility] Visit from 09/01/2017 in [facility]  PHQ-2 Total Score  2  5  0  4  PHQ-9 Total Score  10  18  -  21       Assessment and Plan: Dujuan is a 58 year old Caucasian male, unemployed, lives in [place], divorced, has a history of multiple medical problems including bipolar disorder, PTSD, panic attacks, alcoholism in early remission, chronic pain, hypertension was evaluated by telemedicine today.  Patient is biologically predisposed given his family history, history of trauma and multiple medical problems.  He also has  psychosocial stressors of financial problems, COVID-19 outbreak.  Patient is currently making progress.  Plan as noted below.  Plan Bipolar disorder-improving Gabapentin 300 mg p.o. twice daily Cymbalta 60 mg p.o. daily Olanzapine 5 mg p.o. nightly  PTSD-improving Patient was referred for psychotherapy sessions with Ms. Alden Hipp Cymbalta as prescribed Hydroxyzine 25 mg 4 times a day as needed   For panic attacks-improving Patient to continue with panic focused therapy  Insomnia-improving Patient had sleep study done-pending report Olanzapine will also help.  Alcohol use disorder in remission-patient has been sober since May 2020  For tobacco use disorder-unstable Provided smoking cessation counseling. Start Chantix 0.5 mg p.o. twice daily   Reviewed medical records from [facility] as noted above.   Follow-up in clinic in 3 to 4 weeks or sooner if needed.  October 23 at 11 AM  I have spent atleast 25 minutes non face to face with patient today. More than 50 % of the time was spent for psychoeducation and supportive psychotherapy and care coordination. This note was generated in part or whole with voice recognition software. Voice recognition is usually quite accurate but there are transcription errors that can and very often do occur. I apologize for any typographical errors that were not detected and corrected.      Ursula Alert, MD 08/22/2019, 5:53 PM

## 2019-08-22 NOTE — Patient Instructions (Signed)
Varenicline oral tablets What is this medicine? VARENICLINE (var e NI kleen) is used to help people quit smoking. It is used with a patient support program recommended by your physician. This medicine may be used for other purposes; ask your health care provider or pharmacist if you have questions. COMMON BRAND NAME(S): Chantix What should I tell my health care provider before I take this medicine? They need to know if you have any of these conditions:  heart disease  if you often drink alcohol  kidney disease  mental illness  on hemodialysis  seizures  history of stroke  suicidal thoughts, plans, or attempt; a previous suicide attempt by you or a family member  an unusual or allergic reaction to varenicline, other medicines, foods, dyes, or preservatives  pregnant or trying to get pregnant  breast-feeding How should I use this medicine? Take this medicine by mouth after eating. Take with a full glass of water. Follow the directions on the prescription label. Take your doses at regular intervals. Do not take your medicine more often than directed. There are 3 ways you can use this medicine to help you quit smoking; talk to your health care professional to decide which plan is right for you: 1) you can choose a quit date and start this medicine 1 week before the quit date, or, 2) you can start taking this medicine before you choose a quit date, and then pick a quit date between day 8 and 35 days of treatment, or, 3) if you are not sure that you are able or willing to quit smoking right away, start taking this medicine and slowly decrease the amount you smoke as directed by your health care professional with the goal of being cigarette-free by week 12 of treatment. Stick to your plan; ask about support groups or other ways to help you remain cigarette-free. If you are motivated to quit smoking and did not succeed during a previous attempt with this medicine for reasons other than  side effects, or if you returned to smoking after this treatment, speak with your health care professional about whether another course of this medicine may be right for you. A special MedGuide will be given to you by the pharmacist with each prescription and refill. Be sure to read this information carefully each time. Talk to your pediatrician regarding the use of this medicine in children. This medicine is not approved for use in children. Overdosage: If you think you have taken too much of this medicine contact a poison control center or emergency room at once. NOTE: This medicine is only for you. Do not share this medicine with others. What if I miss a dose? If you miss a dose, take it as soon as you can. If it is almost time for your next dose, take only that dose. Do not take double or extra doses. What may interact with this medicine?  alcohol  insulin  other medicines used to help people quit smoking  theophylline  warfarin This list may not describe all possible interactions. Give your health care provider a list of all the medicines, herbs, non-prescription drugs, or dietary supplements you use. Also tell them if you smoke, drink alcohol, or use illegal drugs. Some items may interact with your medicine. What should I watch for while using this medicine? It is okay if you do not succeed at your attempt to quit and have a cigarette. You can still continue your quit attempt and keep using this medicine as directed.  Just throw away your cigarettes and get back to your quit plan. Talk to your health care provider before using other treatments to quit smoking. Using this medicine with other treatments to quit smoking may increase the risk for side effects compared to using a treatment alone. You may get drowsy or dizzy. Do not drive, use machinery, or do anything that needs mental alertness until you know how this medicine affects you. Do not stand or sit up quickly, especially if you are  an older patient. This reduces the risk of dizzy or fainting spells. Decrease the number of alcoholic beverages that you drink during treatment with this medicine until you know if this medicine affects your ability to tolerate alcohol. Some people have experienced increased drunkenness (intoxication), unusual or sometimes aggressive behavior, or no memory of things that have happened (amnesia) during treatment with this medicine. Sleepwalking can happen during treatment with this medicine, and can sometimes lead to behavior that is harmful to you, other people, or property. Stop taking this medicine and tell your doctor if you start sleepwalking or have other unusual sleep-related activity. After taking this medicine, you may get up out of bed and do an activity that you do not know you are doing. The next morning, you may have no memory of this. Activities include driving a car ("sleep-driving"), making and eating food, talking on the phone, sexual activity, and sleep-walking. Serious injuries have occurred. Stop the medicine and call your doctor right away if you find out you have done any of these activities. Do not take this medicine if you have used alcohol that evening. Do not take it if you have taken another medicine for sleep. The risk of doing these sleep-related activities is higher. Patients and their families should watch out for new or worsening depression or thoughts of suicide. Also watch out for sudden changes in feelings such as feeling anxious, agitated, panicky, irritable, hostile, aggressive, impulsive, severely restless, overly excited and hyperactive, or not being able to sleep. If this happens, call your health care professional. If you have diabetes and you quit smoking, the effects of insulin may be increased and you may need to reduce your insulin dose. Check with your doctor or health care professional about how you should adjust your insulin dose. What side effects may I notice  from receiving this medicine? Side effects that you should report to your doctor or health care professional as soon as possible:  allergic reactions like skin rash, itching or hives, swelling of the face, lips, tongue, or throat  acting aggressive, being angry or violent, or acting on dangerous impulses  breathing problems  changes in emotions or moods  chest pain or chest tightness  feeling faint or lightheaded, falls  hallucination, loss of contact with reality  mouth sores  redness, blistering, peeling or loosening of the skin, including inside the mouth  signs and symptoms of a stroke like changes in vision; confusion; trouble speaking or understanding; severe headaches; sudden numbness or weakness of the face, arm or leg; trouble walking; dizziness; loss of balance or coordination  seizures  sleepwalking  suicidal thoughts or other mood changes Side effects that usually do not require medical attention (report to your doctor or health care professional if they continue or are bothersome):  constipation  gas  headache  nausea, vomiting  strange dreams  trouble sleeping This list may not describe all possible side effects. Call your doctor for medical advice about side effects. You may report side  effects to FDA at 1-800-FDA-1088. Where should I keep my medicine? Keep out of the reach of children. Store at room temperature between 15 and 30 degrees C (59 and 86 degrees F). Throw away any unused medicine after the expiration date. NOTE: This sheet is a summary. It may not cover all possible information. If you have questions about this medicine, talk to your doctor, pharmacist, or health care provider.  2020 Elsevier/Gold Standard (2018-11-04 14:27:36)  

## 2019-08-30 ENCOUNTER — Other Ambulatory Visit: Payer: Self-pay | Admitting: Physician Assistant

## 2019-08-31 ENCOUNTER — Telehealth: Payer: Self-pay | Admitting: Physician Assistant

## 2019-08-31 DIAGNOSIS — F4310 Post-traumatic stress disorder, unspecified: Secondary | ICD-10-CM

## 2019-08-31 DIAGNOSIS — F410 Panic disorder [episodic paroxysmal anxiety] without agoraphobia: Secondary | ICD-10-CM

## 2019-08-31 MED ORDER — HYDROXYZINE PAMOATE 25 MG PO CAPS: 25.0000 mg | ORAL_CAPSULE | Freq: Four times a day (QID) | ORAL | 1 refills | Status: DC | PRN

## 2019-08-31 NOTE — Telephone Encounter (Signed)
Sent!

## 2019-08-31 NOTE — Telephone Encounter (Signed)
Generic atarax 25 mg tid but the rx is only written for 30 tablets and that just last 10 days.  He want to know if it can be written for at 90 tablets.  [facility]  [place]

## 2019-08-31 NOTE — Telephone Encounter (Signed)
Pt called saying he is taking the

## 2019-09-02 ENCOUNTER — Emergency Department
Admission: EM | Admit: 2019-09-02 | Discharge: 2019-09-02 | Disposition: A | Discharge status: Home / Self Care | Payer: PRIVATE HEALTH INSURANCE | Attending: Emergency Medicine | Admitting: Emergency Medicine

## 2019-09-02 ENCOUNTER — Other Ambulatory Visit: Payer: Self-pay

## 2019-09-02 DIAGNOSIS — M79631 Pain in right forearm: Secondary | ICD-10-CM | POA: Diagnosis present

## 2019-09-02 DIAGNOSIS — J45909 Unspecified asthma, uncomplicated: Secondary | ICD-10-CM | POA: Insufficient documentation

## 2019-09-02 DIAGNOSIS — Z79899 Other long term (current) drug therapy: Secondary | ICD-10-CM | POA: Insufficient documentation

## 2019-09-02 DIAGNOSIS — L03113 Cellulitis of right upper limb: Secondary | ICD-10-CM | POA: Insufficient documentation

## 2019-09-02 DIAGNOSIS — F17290 Nicotine dependence, other tobacco product, uncomplicated: Secondary | ICD-10-CM | POA: Diagnosis not present

## 2019-09-02 DIAGNOSIS — I10 Essential (primary) hypertension: Secondary | ICD-10-CM | POA: Diagnosis not present

## 2019-09-02 DIAGNOSIS — Z791 Long term (current) use of non-steroidal anti-inflammatories (NSAID): Secondary | ICD-10-CM | POA: Diagnosis not present

## 2019-09-02 MED ORDER — CLINDAMYCIN PHOSPHATE 600 MG/4ML IJ SOLN
600.0000 mg | Freq: Once | INTRAMUSCULAR | Status: AC
  Administered 2019-09-02: 600 mg via INTRAMUSCULAR
  Filled 2019-09-02: qty 4

## 2019-09-02 MED ORDER — CLINDAMYCIN HCL 300 MG PO CAPS: 300.0000 mg | ORAL_CAPSULE | Freq: Four times a day (QID) | ORAL | 0 refills | Status: AC

## 2019-09-02 NOTE — ED Notes (Signed)
No peripheral IV placed this visit.   Discharge instructions reviewed with patient. Questions fielded by this RN. Patient verbalizes understanding of instructions. Patient discharged home in stable condition per [place], [place]. No acute distress noted at time of discharge.   Pt without appreciable reaction at injection sites

## 2019-09-02 NOTE — ED Notes (Signed)
Pt with cellulitis on right forearm from as pt reports "gotten bit by something under the house", pt does HVAC  Professionally  Pt has 2 red mark on left hand and 2 on the right wrist

## 2019-09-02 NOTE — ED Provider Notes (Signed)
[facility] Emergency Department Provider Note  ____________________________________________  Time seen: Approximately 10:58 PM  I have reviewed the triage vital signs and the nursing notes.   HISTORY  Chief Complaint Arm Pain    HPI Russell Simpson is a 58 y.o. male presents to the emergency department with 6 cm of oblong cellulitis along the volar aspect of the right forearm that developed after patient was bitten/stung by an insect. Puncture marks are visualized. Patient reports that affected area has been tender and hot to the touch.  He denies fever or chills.  No prior history of cutaneous abscesses.  Patient states that he is allergic to penicillin and has anaphylaxis with aforementioned medication.         Past Medical History:  Diagnosis Date  . 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 Arthritis   . Asthma   . Brain tumor (benign) ([place])    benign pituitary neoplasm  . Chronic pain    right arm  . Depression   . Environmental and seasonal allergies   . Hypertension   . Pneumonia   . Sleep apnea    does not wear CPAP ; uses humidifier instead    Patient Active Problem List   Diagnosis Date Noted  . Erectile dysfunction due to arterial insufficiency 08/10/2019  . 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 PTSD (post-traumatic stress disorder) 07/19/2019  . Panic disorder 07/19/2019  . Tobacco use disorder 07/19/2019  . Moderate episode of recurrent major depressive disorder ([place]) 05/25/2019  . S/P lumbar fusion 02/22/2019  . Alcohol use disorder, severe, in early remission ([place]) 01/01/2019  . Disorder of bursae of shoulder region 07/21/2017  . Full thickness rotator cuff tear 07/21/2017  . Localized, primary osteoarthritis 07/21/2017  . 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 Osteoarthritis of knee 07/21/2017  . Anxiety 01/10/2016  . BP (high blood pressure) 01/10/2016  . Apnea, sleep 01/10/2016  . Hyperlipidemia 10/15/2015  .  Pituitary adenoma ([place]) 10/15/2015  . Hypogonadism in male 10/15/2015  . Depression 10/15/2015  . 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 Allergic rhinitis 10/15/2015  . Incomplete bladder emptying 05/30/2015  . Hernia, inguinal, left 09/21/2014  . Benign prostatic hyperplasia with lower urinary tract symptoms 10/24/2012  . 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 OSTEOMYELITIS, ACUTE, OTHER [facility] SITE 06/09/2006    Past Surgical History:  Procedure Laterality Date  . ANTERIOR CERVICAL DECOMP/DISCECTOMY FUSION N/A 06/17/2016   Procedure: ANTERIOR CERVICAL DECOMPRESSION FUSION, CERVICAL 3-4, CERVICAL 4-5 WITH INSTRUMENTATION AND ALLOGRAFT;  Surgeon: Phylliss Bob, MD;  Location: [facility];  Service: Orthopedics;  Laterality: N/A;  ANTERIOR CERVICAL DECOMPRESSION FUSION, CERVICAL 3-4, CERVICAL 4-5 WITH INSTRUMENTATION AND ALLOGRAFT  . BACK SURGERY    . NECK SURGERY  2009    Prior to Admission medications   Medication Sig Start Date End Date Taking? Authorizing Provider  amLODipine (NORVASC) 5 MG tablet Take 1 tablet (5 mg total) by mouth daily. 03/10/18   Jerrol Banana., MD  Arginine 2000 MG PACK Take by mouth daily.    [provider]  atorvastatin (LIPITOR) 10 MG tablet Take 1 tablet (10 mg total) by mouth at bedtime. 02/20/19   Trinna Post, PA-C  Carbonyl Iron (PERFECT IRON) 25 MG TABS Take by mouth daily.    [provider]  clindamycin (CLEOCIN) 300 MG capsule Take 1 capsule (300 mg total) by mouth 4 (four) times daily for 10 days. 09/02/19 09/12/19  Lannie Fields, PA-C  Coenzyme Q10 (COQ-10) 100 MG CAPS Take by mouth daily.    [provider]  colchicine 0.6 MG tablet Take 2 tablets (1.2mg ) by mouth at first sign of gout flare followed by 1 tablet (0.6mg ) after 1 hour. (Max 1.8mg  within 1 hour) 12/12/18   [provider]  Cyanocobalamin (B-12) 5000 MCG SUBL Place under the tongue daily.    [provider]  cyclobenzaprine  (FLEXERIL) 5 MG tablet Take 1 tablet (5 mg total) by mouth at bedtime. 07/27/19   Trinna Post, PA-C  DHEA 50 MG CAPS Take by mouth.    [provider]  DULoxetine (CYMBALTA) 30 MG capsule TAKE 2 CAPSULES BY MOUTH ONCE DAILY 04/15/19   Jerrol Banana., MD  famotidine (PEPCID) 20 MG tablet Take 1 tablet (20 mg total) by mouth 2 (two) times daily. 07/27/19 10/25/19  Trinna Post, PA-C  finasteride (PROSCAR) 5 MG tablet Take 1 tablet (5 mg total) by mouth daily. 12/05/18   Stoioff, Ronda Fairly, MD  fluticasone (FLONASE) 50 MCG/ACT nasal spray Place into the nose. 01/06/14   [provider]  gabapentin (NEURONTIN) 300 MG capsule Take 1 capsule (300 mg total) by mouth 2 (two) times daily. 07/19/19   Ursula Alert, MD  Ginger, Zingiber officinalis, (GINGER PO) Take by mouth.    [provider]  Glucosamine 500 MG CAPS Take by mouth daily.    [provider]  hydrOXYzine (ATARAX/VISTARIL) 25 MG tablet TAKE 1 TABLET BY MOUTH 3 TIMES DAILY AS NEEDED 08/30/19   Trinna Post, PA-C  hydrOXYzine (VISTARIL) 25 MG capsule Take 1 capsule (25 mg total) by mouth 4 (four) times daily as needed. 08/31/19   Trinna Post, PA-C  indomethacin (INDOCIN) 50 MG capsule TAKE 1 CAPSULE BY MOUTH 3 TIMES PER DAY WITH MEALS 03/17/19   Jerrol Banana., MD  loratadine (CLARITIN) 10 MG tablet Take 10 mg by mouth daily.  12/18/14   [provider]  magnesium oxide (MAG-OX) 400 MG tablet Take 400 mg by mouth daily.    [provider]  metoprolol succinate (TOPROL-XL) 25 MG 24 hr tablet TAKE 1 TABLET BY MOUTH ONCE DAILY. 03/17/19   Jerrol Banana., MD  montelukast (SINGULAIR) 10 MG tablet Take 1 tablet (10 mg total) by mouth at bedtime. 12/20/18   Jerrol Banana., MD  naproxen sodium (ANAPROX) 220 MG tablet Take 220 mg by mouth 2 (two) times daily with a meal.    [provider]  OLANZapine (ZYPREXA) 5 MG tablet Take 1 tablet (5 mg total) by  mouth at bedtime. 07/19/19   Ursula Alert, MD  Omega-3 Fatty Acids (FISH OIL) 1000 MG CAPS Take by mouth.    [provider]  omeprazole (PRILOSEC) 20 MG capsule Take 1 capsule (20 mg total) by mouth 2 (two) times daily before a meal. 07/27/19   Pollak, Wendee Beavers, PA-C  OVER THE COUNTER MEDICATION OREGA MAX    [provider]  OVER THE COUNTER MEDICATION RED MARINE ALGAE-375 MG.    [provider]  PROAIR HFA 108 [phone] Base) MCG/ACT inhaler INHALE 2 PUFFS BY MOUTH EVERY 6 HOURS ASNEEDED WHEEZING 10/13/18   Jerrol Banana., MD  sildenafil (REVATIO) 20 MG tablet 3-5 tablets daily as needed for erectile dysfunction 08/10/19   Stoioff, Ronda Fairly, MD  tamsulosin (FLOMAX) 0.4 MG CAPS capsule Take 1 capsule (0.4 mg total) by mouth daily. 08/10/19   Stoioff, Ronda Fairly, MD  testosterone cypionate (DEPOTESTOSTERONE CYPIONATE) 200 MG/ML injection Inject 0.5 mLs (100 mg total) into the muscle once a week. 08/10/19   Stoioff, Ronda Fairly, MD  triamcinolone cream (KENALOG) 0.1 % Apply 1 application topically 2 (two) times daily. 05/23/19   Trinna Post, PA-C  varenicline (CHANTIX) 0.5 MG tablet Take 1 tablet (0.5 mg total) by mouth 2 (two) times daily. Start 1  tablet for 1 week and increase to 2 tablets daily 08/22/19   Ursula Alert, MD    Allergies Penicillins, Tetanus toxoids, Lisinopril, Losartan, Tetanus toxoid, Codeine, and Doxycycline  Family History  Problem Relation Age of Onset  . Diabetes Mother   . Diabetes Other     Social History Social History   Tobacco Use  . Smoking status: Light Tobacco Smoker    Types: E-cigarettes, Cigars  . Smokeless tobacco: Never Used  . Tobacco comment: " sometimes during the day and at night"  Substance Use Topics  . Alcohol use: Not Currently    Alcohol/week: 0.0 standard drinks    Comment: 12 pack a beer a night  . Drug use: No     Review of Systems  Constitutional: No fever/chills Eyes: No visual changes. No  discharge ENT: No upper respiratory complaints. Cardiovascular: no chest pain. Respiratory: no cough. No SOB. Gastrointestinal: No abdominal pain.  No nausea, no vomiting.  No diarrhea.  No constipation. Musculoskeletal: Negative for musculoskeletal pain. Skin: Patient has cellulitis of right forearm. Neurological: Negative for headaches, focal weakness or numbness.   ____________________________________________   PHYSICAL EXAM:  VITAL SIGNS: ED Triage Vitals  Enc Vitals Group     BP 09/02/19 2120 (!) 148/84     Pulse Rate 09/02/19 2120 (!) 104     Resp 09/02/19 2120 16     Temp 09/02/19 2120 98.1 F (36.7 C)     Temp Source 09/02/19 2120 Oral     SpO2 09/02/19 2120 100 %     Weight 09/02/19 2121 230 lb (104.3 kg)     Height 09/02/19 2121 6\' 2"  (1.88 m)     Head Circumference --      Peak Flow --      Pain Score 09/02/19 2121 8     Pain Loc --      Pain Edu? --      Excl. in [place]? --      Constitutional: Alert and oriented. Well appearing and in no acute distress. Eyes: Conjunctivae are normal. PERRL. EOMI. Head: Atraumatic.  Cardiovascular: Mildly tachycardic, regular rhythm. Normal S1 and S2.  Good peripheral circulation. Respiratory: Normal respiratory effort without tachypnea or retractions. Lungs CTAB. Good air entry to the bases with no decreased or absent breath sounds. Gastrointestinal: Bowel sounds 4 quadrants. Soft and nontender to palpation. No guarding or rigidity. No palpable masses. No distention. No CVA tenderness. Musculoskeletal: Full range of motion to all extremities. No gross deformities appreciated. Neurologic:  Normal speech and language. No gross focal neurologic deficits are appreciated.  Skin: Patient has 6 cm of oblong blanching cellulitis along the volar aspect of the right forearm with associated desquamation.  No vesicle formation.  No palpable fluctuance. Psychiatric: Mood and affect are normal. Speech and behavior are normal. Patient exhibits  appropriate insight and judgement.   ____________________________________________   LABS (all labs ordered are listed, but only abnormal results are displayed)  Labs Reviewed - No data to display ____________________________________________  EKG   ____________________________________________  RADIOLOGY   No results found.  ____________________________________________  PROCEDURES  Procedure(s) performed:    Procedures    Medications  clindamycin (CLEOCIN) injection 600 mg (600 mg Intramuscular Given 09/02/19 2233)     ____________________________________________   INITIAL IMPRESSION / ASSESSMENT AND PLAN / ED COURSE  Pertinent labs & imaging results that were available during my care of the patient were reviewed by me and considered in my medical decision making (see chart for details).  Review of the [place] CSRS was performed in accordance of the [place] prior to dispensing any controlled drugs.         Assessment and plan Cellulitis 58 year old male presents to the emergency department with oblong shaped cellulitis along the volar aspect of the right forearm that is been present for the past 1 to 2 days after being reportedly stung by an insect.  Patient was mildly tachycardic at triage but was afebrile.  On physical exam, cellulitis had some mild induration but no palpable fluctuance and was associated with desquamation.  Cellulitis was demarcated in the emergency department using disposable pen and IM clindamycin was administered given patient's penicillin allergy.  He was discharged with clindamycin.  Patient was given strict return precautions to return to the emergency department with worsening cellulitis.  He voiced understanding and stated that he had easy access to the emergency department should symptoms worsen.    ____________________________________________  FINAL CLINICAL IMPRESSION(S) / ED DIAGNOSES  Final diagnoses:  Cellulitis of right  upper extremity      NEW MEDICATIONS STARTED DURING THIS VISIT:  ED Discharge Orders         Ordered    clindamycin (CLEOCIN) 300 MG capsule  4 times daily     09/02/19 2234              This chart was dictated using voice recognition software/Dragon. Despite best efforts to proofread, errors can occur which can change the meaning. Any change was purely unintentional.    Karren Cobble 09/02/19 2303    Carrie Mew, MD 09/02/19 2340

## 2019-09-02 NOTE — ED Triage Notes (Signed)
Pt states he was working in a basement on Thursday. Pt states on Friday he woke up with right forearm pain and swelling. Pt thinks he was bitten by a "brown recluse". Pt with area to right forearm with swelling and redness. Area noted to middle with two small puncture wounds. No drainage noted.

## 2019-09-02 NOTE — Discharge Instructions (Signed)
Take clindamycin 4 times daily for the next 10 days. Return to the emergency department with worsening symptoms.

## 2019-09-07 ENCOUNTER — Ambulatory Visit: Payer: PRIVATE HEALTH INSURANCE | Admitting: Licensed Clinical Social Worker

## 2019-09-07 ENCOUNTER — Other Ambulatory Visit: Payer: Self-pay

## 2019-09-18 ENCOUNTER — Other Ambulatory Visit: Payer: Self-pay | Admitting: Family Medicine

## 2019-09-18 ENCOUNTER — Other Ambulatory Visit: Payer: Self-pay | Admitting: Physician Assistant

## 2019-09-18 DIAGNOSIS — I10 Essential (primary) hypertension: Secondary | ICD-10-CM

## 2019-09-18 MED ORDER — MONTELUKAST SODIUM 10 MG PO TABS: 10.0000 mg | ORAL_TABLET | Freq: Every day | ORAL | 5 refills | Status: DC

## 2019-09-18 NOTE — Telephone Encounter (Signed)
[facility] faxed refill request for the following medications:  montelukast (SINGULAIR) 10 MG tablet     Please advise.

## 2019-09-19 NOTE — Telephone Encounter (Signed)
Recent office visit for GERD was 07/2019. KW

## 2019-09-22 ENCOUNTER — Encounter: Payer: Self-pay | Admitting: Psychiatry

## 2019-09-22 ENCOUNTER — Ambulatory Visit (INDEPENDENT_AMBULATORY_CARE_PROVIDER_SITE_OTHER): Payer: PRIVATE HEALTH INSURANCE | Admitting: Psychiatry

## 2019-09-22 ENCOUNTER — Other Ambulatory Visit: Payer: Self-pay

## 2019-09-22 DIAGNOSIS — F410 Panic disorder [episodic paroxysmal anxiety] without agoraphobia: Secondary | ICD-10-CM

## 2019-09-22 DIAGNOSIS — F4310 Post-traumatic stress disorder, unspecified: Secondary | ICD-10-CM | POA: Diagnosis not present

## 2019-09-22 DIAGNOSIS — F3160 Bipolar disorder, current episode mixed, unspecified: Secondary | ICD-10-CM

## 2019-09-22 DIAGNOSIS — F17200 Nicotine dependence, unspecified, uncomplicated: Secondary | ICD-10-CM | POA: Diagnosis not present

## 2019-09-22 DIAGNOSIS — F3161 Bipolar disorder, current episode mixed, mild: Secondary | ICD-10-CM | POA: Diagnosis not present

## 2019-09-22 MED ORDER — VARENICLINE TARTRATE 0.5 MG PO TABS: 0.5000 mg | ORAL_TABLET | Freq: Two times a day (BID) | ORAL | 1 refills | Status: DC

## 2019-09-22 MED ORDER — GABAPENTIN 300 MG PO CAPS: 300.0000 mg | ORAL_CAPSULE | Freq: Two times a day (BID) | ORAL | 0 refills | Status: DC

## 2019-09-22 MED ORDER — OLANZAPINE 5 MG PO TABS: 7.5000 mg | ORAL_TABLET | Freq: Every day | ORAL | 0 refills | Status: DC

## 2019-09-22 NOTE — Progress Notes (Signed)
Virtual Visit via Video Note  I connected with Russell Simpson on 09/22/19 at 11:00 AM EDT by a video enabled telemedicine application and verified that I am speaking with the correct person using two identifiers.   I discussed the limitations of evaluation and management by telemedicine and the availability of in person appointments. The patient expressed understanding and agreed to proceed.    I discussed the assessment and treatment plan with the patient. The patient was provided an opportunity to ask questions and all were answered. The patient agreed with the plan and demonstrated an understanding of the instructions.   The patient was advised to call back or seek an in-person evaluation if the symptoms worsen or if the condition fails to improve as anticipated.   [facility] MD OP Progress Note  09/22/2019 12:46 PM Russell Simpson  MRN:  [id number]  Chief Complaint:  Chief Complaint    Follow-up     HPI: Russell Simpson is a 58 year old Caucasian male, divorced, unemployed, lives in [place], has a history of bipolar disorder, PTSD, panic attacks, hypertension, prediabetic, multiple back surgeries, history of pituitary adenoma, chronic pain was evaluated by telemedicine today.  A video call was initiated however due to connection problem it had to be changed to a phone call.  Patient today reports he is currently making progress on the Zyprexa.  He has noticed a good difference in his mood.  He however continues to have some mood swings as well as panic attacks.  The frequency of panic attacks have improved.  He does not have it daily anymore.  He reports he does not take the hydroxyzine however try to cope with it himself.  He has been talking to the therapist which has been helpful.  He reports he is on Chantix which helps him with his smoking.  He however reports Chantix does affect his sleep.  He does not want to stop using it yet and want to give it more time.  Patient denies any suicidality,  homicidality or perceptual disturbances.  Patient denies any other concerns today.   Visit Diagnosis:    ICD-10-CM   1. Bipolar 1 disorder, mixed, mild (HCC)  F31.61   2. PTSD (post-traumatic stress disorder)  F43.10 OLANZapine (ZYPREXA) 5 MG tablet    gabapentin (NEURONTIN) 300 MG capsule  3. Panic disorder  F41.0 gabapentin (NEURONTIN) 300 MG capsule  4. Tobacco use disorder  F17.200 varenicline (CHANTIX) 0.5 MG tablet  5. Mixed bipolar I disorder (HCC)  F31.60 OLANZapine (ZYPREXA) 5 MG tablet    gabapentin (NEURONTIN) 300 MG capsule    Past Psychiatric History: I have reviewed past psychiatric history from my progress note on 07/19/2019.  Past trials of Cymbalta, BuSpar, gabapentin, trazodone, melatonin-nightmares.  Past Medical History:  Past Medical History:  Diagnosis Date  . 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 Arthritis   . Asthma   . Brain tumor (benign) ([place])    benign pituitary neoplasm  . Chronic pain    right arm  . Depression   . Environmental and seasonal allergies   . Hypertension   . Pneumonia   . Sleep apnea    does not wear CPAP ; uses humidifier instead    Past Surgical History:  Procedure Laterality Date  . ANTERIOR CERVICAL DECOMP/DISCECTOMY FUSION N/A 06/17/2016   Procedure: ANTERIOR CERVICAL DECOMPRESSION FUSION, CERVICAL 3-4, CERVICAL 4-5 WITH INSTRUMENTATION AND ALLOGRAFT;  Surgeon: Phylliss Bob, MD;  Location: [facility];  Service: Orthopedics;  Laterality: N/A;  ANTERIOR CERVICAL  DECOMPRESSION FUSION, CERVICAL 3-4, CERVICAL 4-5 WITH INSTRUMENTATION AND ALLOGRAFT  . BACK SURGERY    . NECK SURGERY  2009    Family Psychiatric History: Reviewed family psychiatric history from my progress note on 07/19/2019.  Family History:  Family History  Problem Relation Age of Onset  . Diabetes Mother   . Diabetes Other     Social History: Reviewed social history from my progress note on 07/19/2019. Social History   Socioeconomic History  . Marital status:  Divorced    Spouse name: Not on file  . Number of children: Not on file  . Years of education: Not on file  . Highest education level: Not on file  Occupational History  . Not on file  Social Needs  . Financial resource strain: Not on file  . Food insecurity    Worry: Not on file    Inability: Not on file  . Transportation needs    Medical: Not on file    Non-medical: Not on file  Tobacco Use  . Smoking status: Light Tobacco Smoker    Types: E-cigarettes, Cigars  . Smokeless tobacco: Never Used  . Tobacco comment: " sometimes during the day and at night"  Substance and Sexual Activity  . Alcohol use: Not Currently    Alcohol/week: 0.0 standard drinks    Comment: 12 pack a beer a night  . Drug use: No  . Sexual activity: Not Currently  Lifestyle  . Physical activity    Days per week: 1 day    Minutes per session: 10 min  . Stress: Rather much  Relationships  . Social Herbalist on phone: Twice a week    Gets together: Once a week    Attends religious service: Never    Active member of club or organization: No    Attends meetings of clubs or organizations: Never    Relationship status: Divorced  Other Topics Concern  . Not on file  Social History Narrative  . Not on file    Allergies:  Allergies  Allergen Reactions  . Penicillins Anaphylaxis  . Tetanus Toxoids Swelling  . Lisinopril     Other reaction(s): Other (See Comments) Other reaction(s): Cough Other reaction(s): Cough Other reaction(s): Cough Other reaction(s): Cough  . Losartan     Other reaction(s): Other (See Comments) Other reaction(s): Muscle Pain Other reaction(s): Muscle Pain Other reaction(s): Muscle Pain  . Tetanus Toxoid   . Codeine Nausea Only  . Doxycycline Rash    Metabolic Disorder Labs: Lab Results  Component Value Date   HGBA1C 5.8 (A) 05/23/2019   Lab Results  Component Value Date   PROLACTIN 1.3 (L) 12/02/2018   PROLACTIN <1.0 (L) 10/15/2017   Lab Results   Component Value Date   CHOL 255 (H) 02/14/2019   TRIG 155 (H) 02/14/2019   HDL 53 02/14/2019   CHOLHDL 4.8 02/14/2019   LDLCALC 171 (H) 02/14/2019   LDLCALC 108 (H) 10/15/2017   Lab Results  Component Value Date   TSH 3.800 02/14/2019   TSH 3.39 10/15/2017    Therapeutic Level Labs: No results found for: LITHIUM No results found for: VALPROATE No components found for:  CBMZ  Current Medications: Current Outpatient Medications  Medication Sig Dispense Refill  . Arginine 2000 MG PACK Take by mouth daily.    [name] atorvastatin (LIPITOR) 10 MG tablet Take 1 tablet (10 mg total) by mouth at bedtime. 90 tablet 0  . Carbonyl Iron (PERFECT IRON) 25 MG TABS  Take by mouth daily.    . Coenzyme Q10 (COQ-10) 100 MG CAPS Take by mouth daily.    . colchicine 0.6 MG tablet Take 2 tablets (1.2mg ) by mouth at first sign of gout flare followed by 1 tablet (0.6mg ) after 1 hour. (Max 1.8mg  within 1 hour)    . Cyanocobalamin (B-12) 5000 MCG SUBL Place under the tongue daily.    [name] DHEA 50 MG CAPS Take by mouth.    . DULoxetine (CYMBALTA) 30 MG capsule TAKE 2 CAPSULES BY MOUTH ONCE DAILY 60 capsule 5  . famotidine (PEPCID) 20 MG tablet Take 1 tablet (20 mg total) by mouth 2 (two) times daily. 180 tablet 0  . finasteride (PROSCAR) 5 MG tablet Take 1 tablet (5 mg total) by mouth daily. 90 tablet 3  . fluticasone (FLONASE) 50 MCG/ACT nasal spray Place into the nose.    . gabapentin (NEURONTIN) 300 MG capsule Take 1 capsule (300 mg total) by mouth 2 (two) times daily. 180 capsule 0  . Ginger, Zingiber officinalis, (GINGER PO) Take by mouth.    . Glucosamine 500 MG CAPS Take by mouth daily.    . indomethacin (INDOCIN) 50 MG capsule TAKE 1 CAPSULE BY MOUTH 3 TIMES PER DAY WITH MEALS 90 capsule 5  . loratadine (CLARITIN) 10 MG tablet Take 10 mg by mouth daily.     . magnesium oxide (MAG-OX) 400 MG tablet Take 400 mg by mouth daily.    . metoprolol succinate (TOPROL-XL) 25 MG 24 hr tablet TAKE 1 TABLET BY MOUTH  ONCE DAILY. 30 tablet 5  . montelukast (SINGULAIR) 10 MG tablet Take 1 tablet (10 mg total) by mouth at bedtime. 30 tablet 5  . naproxen sodium (ANAPROX) 220 MG tablet Take 220 mg by mouth 2 (two) times daily with a meal.    . OLANZapine (ZYPREXA) 5 MG tablet Take 1.5 tablets (7.5 mg total) by mouth at bedtime. 135 tablet 0  . Omega-3 Fatty Acids (FISH OIL) 1000 MG CAPS Take by mouth.    [name] omeprazole (PRILOSEC) 20 MG capsule TAKE 1 CAPSULE BY MOUTH TWICE DAILY BEFORE MEALS 60 capsule 2  . OVER THE COUNTER MEDICATION OREGA MAX    . OVER THE COUNTER MEDICATION RED MARINE ALGAE-375 MG.    Abe People HFA 108 (90 Base) MCG/ACT inhaler INHALE 2 PUFFS BY MOUTH EVERY 6 HOURS ASNEEDED WHEEZING 8.5 g 11  . sildenafil (REVATIO) 20 MG tablet 3-5 tablets daily as needed for erectile dysfunction 30 tablet 3  . tamsulosin (FLOMAX) 0.4 MG CAPS capsule Take 1 capsule (0.4 mg total) by mouth daily. 90 capsule 3  . testosterone cypionate (DEPOTESTOSTERONE CYPIONATE) 200 MG/ML injection Inject 0.5 mLs (100 mg total) into the muscle once a week. 10 mL 0  . triamcinolone cream (KENALOG) 0.1 % Apply 1 application topically 2 (two) times daily. 30 g 0  . varenicline (CHANTIX) 0.5 MG tablet Take 1 tablet (0.5 mg total) by mouth 2 (two) times daily. 60 tablet 1  . amLODipine (NORVASC) 5 MG tablet Take 1 tablet (5 mg total) by mouth daily. (Patient not taking: Reported on 09/22/2019) 90 tablet 3  . cyclobenzaprine (FLEXERIL) 5 MG tablet Take 1 tablet (5 mg total) by mouth at bedtime. (Patient not taking: Reported on 09/22/2019) 30 tablet 0  . EUCRISA 2 % OINT Apply  a small amount to affected area   qd/bid to aa's chest, inframammary, eye    . hydrOXYzine (ATARAX/VISTARIL) 25 MG tablet TAKE 1 TABLET BY MOUTH 3  TIMES DAILY AS NEEDED (Patient not taking: Reported on 09/22/2019) 30 tablet 0  . hydrOXYzine (VISTARIL) 25 MG capsule Take 1 capsule (25 mg total) by mouth 4 (four) times daily as needed. (Patient not taking: Reported  on 09/22/2019) 120 capsule 1  . tacrolimus (PROTOPIC) 0.1 % ointment Apply 1 application topically 2 (two) times daily.     No current facility-administered medications for this visit.      Musculoskeletal: Strength & Muscle Tone: UTA Gait & Station: normal per report Patient leans: N/A  Psychiatric Specialty Exam: Review of Systems  Psychiatric/Behavioral: The patient is nervous/anxious and has insomnia.   All other systems reviewed and are negative.   There were no vitals taken for this visit.There is no height or weight on file to calculate BMI.  General Appearance: UTA  Eye Contact:  UTA  Speech:  Clear and Coherent  Volume:  Normal  Mood:  Anxious  Affect:  UTA  Thought Process:  Goal Directed and Descriptions of Associations: Intact  Orientation:  Full (Time, Place, and Person)  Thought Content: Logical   Suicidal Thoughts:  No  Homicidal Thoughts:  No  Memory:  Immediate;   Fair Recent;   Fair Remote;   Fair  Judgement:  Fair  Insight:  Fair  Psychomotor Activity:  UTA  Concentration:  Concentration: Fair and Attention Span: Fair  Recall:  [employer] of Knowledge: Fair  Language: Fair  Akathisia:  No  Handed:  Right  AIMS (if indicated): denies tremors, rigidity  Assets:  Communication Skills Desire for Improvement Housing  ADL's:  Intact  Cognition: WNL  Sleep:  Restless   Screenings: PHQ2-9     Chronic Care Management from 03/27/2019 in [facility] Visit from 02/14/2019 in [facility] Visit from 12/29/2018 in [facility] Visit from 09/01/2017 in 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  He is biologically predisposed given his family history, history of trauma and multiple medical problems.  He also has psychosocial stressors of financial problems and COVID-19 outbreak.  Patient is currently making progress however would benefit from medication readjustment since he continues to struggle with mood and sleep.  Plan as noted below.  Plan Bipolar disorder-improving Gabapentin 300 mg p.o. twice daily Cymbalta 60 mg p.o. daily Increase olanzapine to 7.5 mg p.o. nightly  PTSD-improving Patient was referred for psychotherapy sessions with Ms. Alden Hipp. Cymbalta as prescribed   Panic attacks-improving Patient to continue panic focused therapy  Insomnia Patient had sleep study done-pending report Olanzapine will also help.  Alcohol use disorder in remission-patient has been sober since May 2020.  Tobacco use disorder-improving Provided smoking cessation counseling Chantix 0.5 mg p.o. twice daily  Follow-up in clinic in 1 month or sooner if needed.  December 9 at 2:20 PM  I have spent atleast 15 minutes non  face to face with patient today. More than 50 % of the time was spent for psychoeducation and supportive psychotherapy and care coordination. This note was generated in part or whole with voice recognition software. Voice recognition is usually quite accurate but there are  transcription errors that can and very often do occur. I apologize for any typographical errors that were not detected and corrected.       Ursula Alert, MD 09/22/2019, 12:46 PM

## 2019-10-02 ENCOUNTER — Encounter: Payer: Self-pay | Admitting: Family Medicine

## 2019-10-02 ENCOUNTER — Other Ambulatory Visit: Payer: Self-pay

## 2019-10-02 ENCOUNTER — Other Ambulatory Visit: Payer: Self-pay | Admitting: Family Medicine

## 2019-10-02 ENCOUNTER — Ambulatory Visit (INDEPENDENT_AMBULATORY_CARE_PROVIDER_SITE_OTHER): Payer: PRIVATE HEALTH INSURANCE | Admitting: Family Medicine

## 2019-10-02 VITALS — BP 150/98 | HR 118 | Temp 97.1°F | Wt 250.0 lb

## 2019-10-02 DIAGNOSIS — I10 Essential (primary) hypertension: Secondary | ICD-10-CM

## 2019-10-02 DIAGNOSIS — Z981 Arthrodesis status: Secondary | ICD-10-CM

## 2019-10-02 DIAGNOSIS — M545 Low back pain, unspecified: Secondary | ICD-10-CM

## 2019-10-02 LAB — POCT URINALYSIS DIPSTICK
Bilirubin, UA: NEGATIVE
Blood, UA: NEGATIVE
Glucose, UA: NEGATIVE
Ketones, UA: NEGATIVE
Leukocytes, UA: NEGATIVE
Nitrite, UA: NEGATIVE
Protein, UA: NEGATIVE
Spec Grav, UA: 1.025 (ref 1.010–1.025)
Urobilinogen, UA: 0.2 E.U./dL
pH, UA: 6 (ref 5.0–8.0)

## 2019-10-02 MED ORDER — METHOCARBAMOL 500 MG PO TABS: 500.0000 mg | ORAL_TABLET | Freq: Three times a day (TID) | ORAL | 0 refills | Status: DC | PRN

## 2019-10-02 NOTE — Telephone Encounter (Signed)
Patient called back and said he was totally out of Amlodipine  5 mg. Needs to be called in ASAP to [facility]

## 2019-10-02 NOTE — Progress Notes (Signed)
Russell Simpson  MRN: [id number] DOB: 03-15-1961  Subjective:  HPI  The patient is a 58 year old male with history of back issues.  He has had multiple surgeries on his back and neck.  He presents today with complaint of low back pain with no known trauma.  He reports it is worse on the right side and radiates to the left. He describes it as a burning pain that with certain movements makes it worse.  He does say however, that he can workout and it does not bother him.  He has been experiencing the pain for about 2-3 weeks.  He did take an old Tramodol that he had from his neck surgery but said it does not help any. He denies any urinary symptoms.  Patient Active Problem List   Diagnosis Date Noted  . Erectile dysfunction due to arterial insufficiency 08/10/2019  . Bipolar 1 disorder, mixed, mild ([place]) 07/19/2019  . PTSD (post-traumatic stress disorder) 07/19/2019  . Panic disorder 07/19/2019  . Tobacco use disorder 07/19/2019  . Moderate episode of recurrent major depressive disorder ([place]) 05/25/2019  . S/P lumbar fusion 02/22/2019  . Alcohol use disorder, severe, in early remission ([place]) 01/01/2019  . Disorder of bursae of shoulder region 07/21/2017  . Full thickness rotator cuff tear 07/21/2017  . Localized, primary osteoarthritis 07/21/2017  . 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 Osteoarthritis of knee 07/21/2017  . Anxiety 01/10/2016  . BP (high blood pressure) 01/10/2016  . Apnea, sleep 01/10/2016  . Hyperlipidemia 10/15/2015  . Pituitary adenoma ([place]) 10/15/2015  . Hypogonadism in male 10/15/2015  . Depression 10/15/2015  . 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 Allergic rhinitis 10/15/2015  . Incomplete bladder emptying 05/30/2015  . Hernia, inguinal, left 09/21/2014  . Benign prostatic hyperplasia with lower urinary tract symptoms 10/24/2012  . 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 OSTEOMYELITIS, ACUTE, OTHER [facility] SITE 06/09/2006   Past Medical History:   Diagnosis Date  . 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 Arthritis   . Asthma   . Brain tumor (benign) ([place])    benign pituitary neoplasm  . Chronic pain    right arm  . Depression   . Environmental and seasonal allergies   . Hypertension   . Pneumonia   . Sleep apnea    does not wear CPAP ; uses humidifier instead   Past Surgical History:  Procedure Laterality Date  . ANTERIOR CERVICAL DECOMP/DISCECTOMY FUSION N/A 06/17/2016   Procedure: ANTERIOR CERVICAL DECOMPRESSION FUSION, CERVICAL 3-4, CERVICAL 4-5 WITH INSTRUMENTATION AND ALLOGRAFT;  Surgeon: Phylliss Bob, MD;  Location: [facility];  Service: Orthopedics;  Laterality: N/A;  ANTERIOR CERVICAL DECOMPRESSION FUSION, CERVICAL 3-4, CERVICAL 4-5 WITH INSTRUMENTATION AND ALLOGRAFT  . BACK SURGERY    . NECK SURGERY  2009   Family History  Problem Relation Age of Onset  . Diabetes Mother   . Diabetes Other    Social History   Socioeconomic History  . Marital status: Divorced    Spouse name: Not on file  . Number of children: Not on file  . Years of education: Not on file  . Highest education level: Not on file  Occupational History  . Not on file  Social Needs  . Financial resource strain: Not on file  . Food insecurity    Worry: Not on file    Inability: Not on file  . Transportation needs    Medical: Not on file    Non-medical: Not on file  Tobacco Use  . Smoking status: Light Tobacco Smoker    Types: E-cigarettes, Cigars  . Smokeless tobacco: Never Used  . Tobacco comment: " sometimes during the day and at night"  Substance and Sexual Activity  . Alcohol use: Not Currently    Alcohol/week: 0.0 standard drinks    Comment: 12 pack a beer a night  . Drug use: No  . Sexual activity: Not Currently  Lifestyle  . Physical activity    Days per week: 1 day    Minutes per session: 10 min  . Stress: Rather much  Relationships  . Social Herbalist on phone: Twice a week    Gets together: Once a week     Attends religious service: Never    Active member of club or organization: No    Attends meetings of clubs or organizations: Never    Relationship status: Divorced  . Intimate partner violence    Fear of current or ex partner: Not on file    Emotionally abused: Not on file    Physically abused: Not on file    Forced sexual activity: Not on file  Other Topics Concern  . Not on file  Social History Narrative  . Not on file   Outpatient Encounter Medications as of 10/02/2019  Medication Sig  . amLODipine (NORVASC) 5 MG tablet Take 1 tablet (5 mg total) by mouth daily. (Patient not taking: Reported on 09/22/2019)  . Arginine 2000 MG PACK Take by mouth daily.  [name] atorvastatin (LIPITOR) 10 MG tablet Take 1 tablet (10 mg total) by mouth at bedtime.  Carin Hock Iron (PERFECT IRON) 25 MG TABS Take by mouth daily.  . Coenzyme Q10 (COQ-10) 100 MG CAPS Take by mouth daily.  . colchicine 0.6 MG tablet Take 2 tablets (1.2mg ) by mouth at first sign of gout flare followed by 1 tablet (0.6mg ) after 1 hour. (Max 1.8mg  within 1 hour)  . Cyanocobalamin (B-12) 5000 MCG SUBL Place under the tongue daily.  . cyclobenzaprine (FLEXERIL) 5 MG tablet Take 1 tablet (5 mg total) by mouth at bedtime. (Patient not taking: Reported on 09/22/2019)  . DHEA 50 MG CAPS Take by mouth.  . DULoxetine (CYMBALTA) 30 MG capsule TAKE 2 CAPSULES BY MOUTH ONCE DAILY  . EUCRISA 2 % OINT Apply  a small amount to affected area   qd/bid to aa's chest, inframammary, eye  . famotidine (PEPCID) 20 MG tablet Take 1 tablet (20 mg total) by mouth 2 (two) times daily.  . finasteride (PROSCAR) 5 MG tablet Take 1 tablet (5 mg total) by mouth daily.  . fluticasone (FLONASE) 50 MCG/ACT nasal spray Place into the nose.  . gabapentin (NEURONTIN) 300 MG capsule Take 1 capsule (300 mg total) by mouth 2 (two) times daily.  . Ginger, Zingiber officinalis, (GINGER PO) Take by mouth.  . Glucosamine 500 MG CAPS Take by mouth daily.  . hydrOXYzine  (ATARAX/VISTARIL) 25 MG tablet TAKE 1 TABLET BY MOUTH 3 TIMES DAILY AS NEEDED (Patient not taking: Reported on 09/22/2019)  . hydrOXYzine (VISTARIL) 25 MG capsule Take 1 capsule (25 mg total) by mouth 4 (four) times daily as needed. (Patient not taking: Reported on 09/22/2019)  . indomethacin (INDOCIN) 50 MG capsule TAKE 1 CAPSULE BY MOUTH 3 TIMES PER DAY WITH MEALS  . loratadine (CLARITIN) 10 MG tablet Take 10 mg by mouth daily.   . magnesium oxide (MAG-OX) 400 MG tablet Take 400 mg by mouth daily.  . metoprolol succinate (TOPROL-XL)  25 MG 24 hr tablet TAKE 1 TABLET BY MOUTH ONCE DAILY.  . montelukast (SINGULAIR) 10 MG tablet Take 1 tablet (10 mg total) by mouth at bedtime.  . naproxen sodium (ANAPROX) 220 MG tablet Take 220 mg by mouth 2 (two) times daily with a meal.  . OLANZapine (ZYPREXA) 5 MG tablet Take 1.5 tablets (7.5 mg total) by mouth at bedtime.  . Omega-3 Fatty Acids (FISH OIL) 1000 MG CAPS Take by mouth.  [name] omeprazole (PRILOSEC) 20 MG capsule TAKE 1 CAPSULE BY MOUTH TWICE DAILY BEFORE MEALS  . OVER THE COUNTER MEDICATION OREGA MAX  . OVER THE COUNTER MEDICATION RED MARINE ALGAE-375 MG.  Abe People HFA 108 (90 Base) MCG/ACT inhaler INHALE 2 PUFFS BY MOUTH EVERY 6 HOURS ASNEEDED WHEEZING  . sildenafil (REVATIO) 20 MG tablet 3-5 tablets daily as needed for erectile dysfunction  . tacrolimus (PROTOPIC) 0.1 % ointment Apply 1 application topically 2 (two) times daily.  . tamsulosin (FLOMAX) 0.4 MG CAPS capsule Take 1 capsule (0.4 mg total) by mouth daily.  [name] testosterone cypionate (DEPOTESTOSTERONE CYPIONATE) 200 MG/ML injection Inject 0.5 mLs (100 mg total) into the muscle once a week.  . triamcinolone cream (KENALOG) 0.1 % Apply 1 application topically 2 (two) times daily.  . varenicline (CHANTIX) 0.5 MG tablet Take 1 tablet (0.5 mg total) by mouth 2 (two) times daily.   No facility-administered encounter medications on file as of 10/02/2019.    Allergies  Allergen Reactions  .  Penicillins Anaphylaxis  . Tetanus Toxoids Swelling  . Lisinopril     Other reaction(s): Other (See Comments) Other reaction(s): Cough Other reaction(s): Cough Other reaction(s): Cough Other reaction(s): Cough  . Losartan     Other reaction(s): Other (See Comments) Other reaction(s): Muscle Pain Other reaction(s): Muscle Pain Other reaction(s): Muscle Pain  . Tetanus Toxoid   . Codeine Nausea Only  . Doxycycline Rash   Review of Systems  Constitutional: Negative for chills, diaphoresis, fever and malaise/fatigue.  HENT: Negative for congestion, ear pain, sinus pain and sore throat.   Respiratory: Negative for cough and shortness of breath.   Cardiovascular: Negative for chest pain.  Gastrointestinal: Negative for abdominal pain and diarrhea.  Genitourinary: Negative for dysuria, frequency, hematuria and urgency.  Musculoskeletal: Negative for back pain and myalgias.  Neurological: Negative for headaches.    Objective:  BP (!) 150/98 (BP Location: Right Arm, Patient Position: Sitting, Cuff Size: Normal)   Pulse (!) 118   Temp (!) 97.1 F (36.2 C) (Skin)   Wt 250 lb (113.4 kg)   SpO2 96%   BMI 32.10 kg/m   Physical Exam  Constitutional: He is oriented to person, place, and time and well-developed, well-nourished, and in no distress.  HENT:  Head: Normocephalic.  Eyes: Conjunctivae are normal.  Neck: Neck supple.  Cardiovascular: Regular rhythm.  tachycardia  Pulmonary/Chest: Effort normal and breath sounds normal.  Abdominal: Soft. Bowel sounds are normal.  No CVA tenderness to percussion posteriorly.  Musculoskeletal:     Comments: Well healed lumbar scar from L5-S1 laminectomy. Also, has well healed anterior cervical scar from spinal fusion. Fair ROM with tightness in the lower back and burning discomfort. No radiation to legs. Mild to moderate discomfort to test ROM.  Neurological: He is alert and oriented to person, place, and time.  Skin: No rash noted.   Psychiatric: Mood, affect and judgment normal.    Assessment and Plan :   1. Acute right-sided low back pain without sciatica Developed low back tightness  and pain over the past 2-3 weeks. Works in his own heating and cooling business for 35 years. No weakness or radiation of pain. Suspect muscle spasms from repetitive muscle strain. May use Methocarbamol prn spasms. Given Methocarbamol for back strain. - methocarbamol (ROBAXIN) 500 MG tablet; Take 1 tablet (500 mg total) by mouth every 8 (eight) hours as needed for muscle spasms.  Dispense: 30 tablet; Refill: 0  2. S/P lumbar fusion Well healed lumbar spine scars from laminectomy. May apply moist heat or Aspercreme with Lidocaine for back pains. No urologic symptoms. Urinalysis essentially clear except slight increase in specific gravity to need extra fluid intake.  3. Essential hypertension BP high with pulse increase. Has not been taking all the antihypertensive meds as prescribed. Recommend he get back on the Amlodipine 5 mg qd and restart the Metoprolol 25 mg qd regularly. If any chest pains or significant palpitations, may need to go to the ER. Should recheck BP daily at home and call report.

## 2019-10-03 MED ORDER — AMLODIPINE BESYLATE 5 MG PO TABS: 5.0000 mg | ORAL_TABLET | Freq: Every day | ORAL | 3 refills | Status: DC

## 2019-10-11 ENCOUNTER — Ambulatory Visit: Payer: PRIVATE HEALTH INSURANCE | Admitting: Licensed Clinical Social Worker

## 2019-10-11 ENCOUNTER — Other Ambulatory Visit: Payer: Self-pay

## 2019-10-12 ENCOUNTER — Telehealth: Payer: Self-pay | Admitting: Physician Assistant

## 2019-10-12 NOTE — Telephone Encounter (Signed)
Russell Simpson Patient was given Robaxin for his back.  Did you think there was anything else he could use

## 2019-10-12 NOTE — Telephone Encounter (Signed)
Pt calling back to let Simona Huh know he is not better with his back since the visit. Pt asking what else can be done to help with back pain.  Please advise.  Thanks, [employer]

## 2019-10-12 NOTE — Telephone Encounter (Signed)
Should add Ibuprofen 200 mg 3 tablets 3-4 times a day for pain, Salonpas Lidocaine patch to the lower back twice a day and may need to switch the Robaxin to Baclofen 5 mg TID #30. Schedule follow up appointment next Monday or Tuesday to assess progress.

## 2019-10-12 NOTE — Telephone Encounter (Signed)
Patient states he is already taking Tylenol and Ibuprofen together.  He took some tramodol and it didn't do anything to help.  He is already using the [employer].  He said it feels like maybe a kidney stone.   What he really wants is to get an x-ray because he thinks there may be "something real bad wrong in there".

## 2019-10-12 NOTE — Telephone Encounter (Signed)
If worsening pain or nauseated and passing any blood in urine, probably should go to ER. Can schedule for renal ultrasound tomorrow to rule out kidney stones.[name]

## 2019-10-13 ENCOUNTER — Other Ambulatory Visit: Payer: Self-pay

## 2019-10-13 DIAGNOSIS — M545 Low back pain, unspecified: Secondary | ICD-10-CM

## 2019-10-13 NOTE — Telephone Encounter (Signed)
LMTCB  Order placed for U/S

## 2019-10-17 NOTE — Telephone Encounter (Signed)
Patient advised. [place] scheduled for 10/20/2019 at 3pm

## 2019-10-19 ENCOUNTER — Other Ambulatory Visit: Payer: Self-pay | Admitting: Family Medicine

## 2019-10-19 DIAGNOSIS — I10 Essential (primary) hypertension: Secondary | ICD-10-CM

## 2019-10-19 NOTE — Telephone Encounter (Signed)
Requested medication (s) are due for refill today: yes  Requested medication (s) are on the active medication list: yes  Last refill:  09/18/2019  Future visit scheduled: no  Notes to clinic: one inhaler should last at least one month Review for refill   Requested Prescriptions  Pending Prescriptions Disp Refills   albuterol (VENTOLIN HFA) 108 (90 Base) MCG/ACT inhaler [Pharmacy Med Name: ALBUTEROL SULFATE HFA 108 (90 BASE)] 8.5 g 11    Sig: INHALE 2 PUFFS BY MOUTH EVERY 6 HOURS ASNEEDED FOR WHEEZING     Pulmonology:  Beta Agonists Failed - 10/19/2019  2:10 PM      Failed - One inhaler should last at least one month. If the patient is requesting refills earlier, contact the patient to check for uncontrolled symptoms.      Passed - Valid encounter within last 12 months    Recent Outpatient Visits          2 weeks ago Acute right-sided low back pain without sciatica   [place], [place]   2 months ago Cough   [facility] Carles Collet M, [place]   3 months ago Patient left without being seen   [facility] Terrilee Croak, Adriana M, PA-C   4 months ago Anxiety and depression   [place], [place], PA-C   6 months ago Frequency of urination   [place], [place]      Future Appointments            In 9 months Stoioff, Ronda Fairly, MD [facility]           Signed Prescriptions Disp Refills   metoprolol succinate (TOPROL-XL) 25 MG 24 hr tablet 30 tablet 3    Sig: TAKE 1 TABLET BY MOUTH ONCE DAILY     Cardiovascular:  Beta Blockers Failed - 10/19/2019  2:10 PM      Failed - Last BP in normal range    BP Readings from Last 1 Encounters:  10/02/19 (!) 150/98         Failed - Last Heart Rate in normal range    Pulse Readings from Last 1 Encounters:  10/02/19 (!) 118         Passed - Valid encounter within last 6 months    Recent Outpatient  Visits          2 weeks ago Acute right-sided low back pain without sciatica   [place], [place]   2 months ago Cough   [place], [place]   3 months ago Patient left without being seen   [facility] Coats, [place], PA-C   4 months ago Anxiety and depression   [place], [place], [place]   6 months ago Frequency of urination   [place], Clearnce Sorrel, [place]      Future Appointments            In 9 months [place], Ronda Fairly, [facility]

## 2019-10-20 ENCOUNTER — Ambulatory Visit
Admission: RE | Admit: 2019-10-20 | Discharge: 2019-10-20 | Disposition: A | Discharge status: Home / Self Care | Payer: PRIVATE HEALTH INSURANCE | Source: Ambulatory Visit | Attending: Family Medicine | Admitting: Family Medicine

## 2019-10-20 ENCOUNTER — Other Ambulatory Visit: Payer: Self-pay

## 2019-10-20 DIAGNOSIS — M545 Low back pain, unspecified: Secondary | ICD-10-CM

## 2019-10-23 ENCOUNTER — Telehealth: Payer: Self-pay

## 2019-10-23 NOTE — Telephone Encounter (Signed)
Patient advised of results. He is still having lower back pain. Patient scheduled an office visit for tomorrow at 1:20pm for further evaluation.

## 2019-10-23 NOTE — Telephone Encounter (Signed)
-----   Message from [place], [place] sent at 10/23/2019  2:10 PM EST ----- No kidney stones or obstructive uropathy (swelling of kidneys). Normal renal ultrasound. Suspect this is a musculoskeletal back pain. Recheck appointment as needed.

## 2019-10-24 ENCOUNTER — Ambulatory Visit
Admission: RE | Admit: 2019-10-24 | Discharge: 2019-10-24 | Disposition: A | Discharge status: Home / Self Care | Payer: PRIVATE HEALTH INSURANCE | Source: Ambulatory Visit | Attending: Family Medicine | Admitting: Family Medicine

## 2019-10-24 ENCOUNTER — Other Ambulatory Visit: Payer: Self-pay

## 2019-10-24 ENCOUNTER — Ambulatory Visit (INDEPENDENT_AMBULATORY_CARE_PROVIDER_SITE_OTHER): Payer: PRIVATE HEALTH INSURANCE | Admitting: Family Medicine

## 2019-10-24 ENCOUNTER — Encounter: Payer: Self-pay | Admitting: Family Medicine

## 2019-10-24 VITALS — BP 140/88 | HR 95 | Temp 97.3°F | Wt 253.0 lb

## 2019-10-24 DIAGNOSIS — M545 Low back pain, unspecified: Secondary | ICD-10-CM

## 2019-10-24 DIAGNOSIS — Z981 Arthrodesis status: Secondary | ICD-10-CM | POA: Diagnosis not present

## 2019-10-24 MED ORDER — PREDNISONE 10 MG PO TABS: ORAL_TABLET | ORAL | 0 refills | Status: DC

## 2019-10-24 MED ORDER — CYCLOBENZAPRINE HCL 10 MG PO TABS: 10.0000 mg | ORAL_TABLET | Freq: Three times a day (TID) | ORAL | 0 refills | Status: DC | PRN

## 2019-10-24 NOTE — Progress Notes (Signed)
Russell Simpson  MRN: [id number] DOB: 1961/11/09  Subjective:  HPI   The patient is a 58 year old male who presents for continued back pain.  He was seen for this on 10/02/19.  At that time he was given Robaxin and instructed to use heat and/or Aspercreme with Lidocaine. Patient states he has used all of the Robaxin, 2 boxes of the SalonPas and has even taken some of his Tramodol that was left over from his neck surgery.  He states that he did not get any relief from any of this.   He reports the pain is not as bad when he is sitting but it is worse when walking.  Patient Active Problem List   Diagnosis Date Noted  . Erectile dysfunction due to arterial insufficiency 08/10/2019  . Bipolar 1 disorder, mixed, mild ([place]) 07/19/2019  . PTSD (post-traumatic stress disorder) 07/19/2019  . Panic disorder 07/19/2019  . Tobacco use disorder 07/19/2019  . Moderate episode of recurrent major depressive disorder ([place]) 05/25/2019  . S/P lumbar fusion 02/22/2019  . Alcohol use disorder, severe, in early remission ([place]) 01/01/2019  . Disorder of bursae of shoulder region 07/21/2017  . Full thickness rotator cuff tear 07/21/2017  . Localized, primary osteoarthritis 07/21/2017  . 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 Osteoarthritis of knee 07/21/2017  . Anxiety 01/10/2016  . BP (high blood pressure) 01/10/2016  . Apnea, sleep 01/10/2016  . Hyperlipidemia 10/15/2015  . Pituitary adenoma ([place]) 10/15/2015  . Hypogonadism in male 10/15/2015  . Depression 10/15/2015  . 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 Allergic rhinitis 10/15/2015  . Incomplete bladder emptying 05/30/2015  . Hernia, inguinal, left 09/21/2014  . Benign prostatic hyperplasia with lower urinary tract symptoms 10/24/2012  . 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 OSTEOMYELITIS, ACUTE, OTHER [facility] SITE 06/09/2006    Past Medical History:  Diagnosis Date  . 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 Arthritis   . Asthma    . Brain tumor (benign) ([place])    benign pituitary neoplasm  . Chronic pain    right arm  . Depression   . Environmental and seasonal allergies   . Hypertension   . Pneumonia   . Sleep apnea    does not wear CPAP ; uses humidifier instead   Past Surgical History:  Procedure Laterality Date  . ANTERIOR CERVICAL DECOMP/DISCECTOMY FUSION N/A 06/17/2016   Procedure: ANTERIOR CERVICAL DECOMPRESSION FUSION, CERVICAL 3-4, CERVICAL 4-5 WITH INSTRUMENTATION AND ALLOGRAFT;  Surgeon: Phylliss Bob, MD;  Location: [facility];  Service: Orthopedics;  Laterality: N/A;  ANTERIOR CERVICAL DECOMPRESSION FUSION, CERVICAL 3-4, CERVICAL 4-5 WITH INSTRUMENTATION AND ALLOGRAFT  . BACK SURGERY    . NECK SURGERY  2009   Family History  Problem Relation Age of Onset  . Diabetes Mother   . Diabetes Other    Social History   Socioeconomic History  . Marital status: Divorced    Spouse name: Not on file  . Number of children: Not on file  . Years of education: Not on file  . Highest education level: Not on file  Occupational History  . Not on file  Social Needs  . Financial resource strain: Not on file  . Food insecurity    Worry: Not on file    Inability: Not on file  . Transportation needs    Medical: Not on file    Non-medical: Not on file  Tobacco Use  . Smoking status: Light Tobacco Smoker    Types: E-cigarettes,  Cigars  . Smokeless tobacco: Never Used  . Tobacco comment: " sometimes during the day and at night"  Substance and Sexual Activity  . Alcohol use: Not Currently    Alcohol/week: 0.0 standard drinks    Comment: 12 pack a beer a night  . Drug use: No  . Sexual activity: Not Currently  Lifestyle  . Physical activity    Days per week: 1 day    Minutes per session: 10 min  . Stress: Rather much  Relationships  . Social Herbalist on phone: Twice a week    Gets together: Once a week    Attends religious service: Never    Active member of club or organization: No     Attends meetings of clubs or organizations: Never    Relationship status: Divorced  . Intimate partner violence    Fear of current or ex partner: Not on file    Emotionally abused: Not on file    Physically abused: Not on file    Forced sexual activity: Not on file  Other Topics Concern  . Not on file  Social History Narrative  . Not on file    Outpatient Encounter Medications as of 10/24/2019  Medication Sig  . albuterol (VENTOLIN HFA) 108 (90 Base) MCG/ACT inhaler INHALE 2 PUFFS BY MOUTH EVERY 6 HOURS ASNEEDED FOR WHEEZING  . amLODipine (NORVASC) 5 MG tablet Take 1 tablet (5 mg total) by mouth daily.  . Arginine 2000 MG PACK Take by mouth daily.  [name] atorvastatin (LIPITOR) 10 MG tablet Take 1 tablet (10 mg total) by mouth at bedtime.  Carin Hock Iron (PERFECT IRON) 25 MG TABS Take by mouth daily.  . Coenzyme Q10 (COQ-10) 100 MG CAPS Take by mouth daily.  . colchicine 0.6 MG tablet Take 2 tablets (1.2mg ) by mouth at first sign of gout flare followed by 1 tablet (0.6mg ) after 1 hour. (Max 1.8mg  within 1 hour)  . Cyanocobalamin (B-12) 5000 MCG SUBL Place under the tongue daily.  [name] DHEA 50 MG CAPS Take by mouth.  . DULoxetine (CYMBALTA) 30 MG capsule TAKE 2 CAPSULES BY MOUTH ONCE DAILY  . EUCRISA 2 % OINT Apply  a small amount to affected area   qd/bid to aa's chest, inframammary, eye  . famotidine (PEPCID) 20 MG tablet Take 1 tablet (20 mg total) by mouth 2 (two) times daily.  . finasteride (PROSCAR) 5 MG tablet Take 1 tablet (5 mg total) by mouth daily.  . fluticasone (FLONASE) 50 MCG/ACT nasal spray Place into the nose.  . gabapentin (NEURONTIN) 300 MG capsule Take 1 capsule (300 mg total) by mouth 2 (two) times daily.  . Ginger, Zingiber officinalis, (GINGER PO) Take by mouth.  . Glucosamine 500 MG CAPS Take by mouth daily.  . indomethacin (INDOCIN) 50 MG capsule TAKE 1 CAPSULE BY MOUTH 3 TIMES PER DAY WITH MEALS  . loratadine (CLARITIN) 10 MG tablet Take 10 mg by mouth daily.   .  magnesium oxide (MAG-OX) 400 MG tablet Take 400 mg by mouth daily.  . methocarbamol (ROBAXIN) 500 MG tablet Take 1 tablet (500 mg total) by mouth every 8 (eight) hours as needed for muscle spasms.  . metoprolol succinate (TOPROL-XL) 25 MG 24 hr tablet TAKE 1 TABLET BY MOUTH ONCE DAILY  . montelukast (SINGULAIR) 10 MG tablet Take 1 tablet (10 mg total) by mouth at bedtime.  . naproxen sodium (ANAPROX) 220 MG tablet Take 220 mg by mouth 2 (two) times daily with  a meal.  . OLANZapine (ZYPREXA) 5 MG tablet Take 1.5 tablets (7.5 mg total) by mouth at bedtime.  . Omega-3 Fatty Acids (FISH OIL) 1000 MG CAPS Take by mouth.  [name] omeprazole (PRILOSEC) 20 MG capsule TAKE 1 CAPSULE BY MOUTH TWICE DAILY BEFORE MEALS  . OVER THE COUNTER MEDICATION OREGA MAX  . OVER THE COUNTER MEDICATION RED MARINE ALGAE-375 MG.  . sildenafil (REVATIO) 20 MG tablet 3-5 tablets daily as needed for erectile dysfunction  . tacrolimus (PROTOPIC) 0.1 % ointment Apply 1 application topically 2 (two) times daily.  . tamsulosin (FLOMAX) 0.4 MG CAPS capsule Take 1 capsule (0.4 mg total) by mouth daily.  [name] testosterone cypionate (DEPOTESTOSTERONE CYPIONATE) 200 MG/ML injection Inject 0.5 mLs (100 mg total) into the muscle once a week.  . triamcinolone cream (KENALOG) 0.1 % Apply 1 application topically 2 (two) times daily.  . varenicline (CHANTIX) 0.5 MG tablet Take 1 tablet (0.5 mg total) by mouth 2 (two) times daily.   No facility-administered encounter medications on file as of 10/24/2019.     Allergies  Allergen Reactions  . Penicillins Anaphylaxis  . Tetanus Toxoids Swelling  . Lisinopril     Other reaction(s): Other (See Comments) Other reaction(s): Cough Other reaction(s): Cough Other reaction(s): Cough Other reaction(s): Cough  . Losartan     Other reaction(s): Other (See Comments) Other reaction(s): Muscle Pain Other reaction(s): Muscle Pain Other reaction(s): Muscle Pain  . Tetanus Toxoid   . Codeine Nausea Only   . Doxycycline Rash    Review of Systems  Constitutional: Negative for chills, diaphoresis, fever and malaise/fatigue.  HENT: Negative for congestion, ear pain, sinus pain and sore throat.   Respiratory: Negative for cough and shortness of breath.   Cardiovascular: Negative for chest pain.  Gastrointestinal: Negative for abdominal pain and diarrhea.  Musculoskeletal: Positive for back pain (with some pain going into the buttocks). Negative for myalgias.  Neurological: Negative for headaches.    Objective:  BP 140/88 (BP Location: Right Arm, Patient Position: Sitting, Cuff Size: Normal)   Pulse 95   Temp (!) 97.3 F (36.3 C) (Skin)   Wt 253 lb (114.8 kg)   SpO2 97%   BMI 32.48 kg/m   Physical Exam  Constitutional: He is oriented to person, place, and time and well-developed, well-nourished, and in no distress.  HENT:  Head: Normocephalic.  Eyes: Conjunctivae are normal.  Neck: Neck supple.  Pulmonary/Chest: Effort normal.  Abdominal: Soft.  Musculoskeletal:     Comments: Well healed scar in lower lumbar region from past lumbar and cervical laminectomies with lumbar fusion. 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  Neurological: He is alert and oriented to person, place, and time.  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  Skin: No rash noted.  Psychiatric: Mood, affect and judgment normal.    Assessment and Plan :   1. Acute low back pain without sciatica, unspecified back pain laterality No specific injury known. Not getting significant relief from Robaxin, Salonpas Patches or Tramadol. Feels a tightness in buttocks and lower back when he first stands that shortly progresses to pain. Pain stops when he lies down or sits down. Pain 0/10 in those positions. 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 Will treat with prednisone taper, Flexeril and get follow up lumbar films. May  need follow up with neurosurgeon pending reports. - DG Lumbar Spine Complete - predniSONE (DELTASONE)  10 MG tablet; Take tablets by mouth on a taper starting 6 day 1, 5 day 2, 4 day 3, 3 day 4, 2 day 5 and 1 day 6 (spread out doses between meals).  Dispense: 21 tablet; Refill: 0 - cyclobenzaprine (FLEXERIL) 10 MG tablet; Take 1 tablet (10 mg total) by mouth 3 (three) times daily as needed for muscle spasms.  Dispense: 30 tablet; Refill: 0  2. S/P lumbar fusion Had 3 back surgeries by Dr. Hal Neer (neurosurgeon) in the lumbar region starting in 1992. Denies radiculopathy but back and neck is stiff.

## 2019-11-04 ENCOUNTER — Other Ambulatory Visit: Payer: Self-pay | Admitting: Family Medicine

## 2019-11-04 DIAGNOSIS — F329 Major depressive disorder, single episode, unspecified: Secondary | ICD-10-CM

## 2019-11-04 DIAGNOSIS — F32A Depression, unspecified: Secondary | ICD-10-CM

## 2019-11-04 NOTE — Telephone Encounter (Signed)
Requested medication (s) are due for refill today  Yes  Requested medication (s) are on the active medication list   Yes  Future visit scheduled  No  Last OV  10/24/19  Protocol failed due to elevated B/P @ LOV  140/88.  Routing to PVP for further consideration.   Requested Prescriptions  Pending Prescriptions Disp Refills   DULoxetine (CYMBALTA) 30 MG capsule [Pharmacy Med Name: DULOXETINE HCL 30 MG CAP] 60 capsule 5    Sig: TAKE 2 CAPSULES BY MOUTH ONCE DAILY     Psychiatry: Antidepressants - SNRI Failed - 11/04/2019  2:44 PM      Failed - Last BP in normal range    BP Readings from Last 1 Encounters:  10/24/19 140/88         Passed - Valid encounter within last 6 months    Recent Outpatient Visits          1 week ago Acute low back pain without sciatica, unspecified back pain laterality   [place], [place]   1 month ago Acute right-sided low back pain without sciatica   [place], [place]   3 months ago Cough   [place], [place]   4 months ago Patient left without being seen   [place], [place], PA-C   5 months ago Anxiety and depression   [place], [place]ee [place], [place]      Future Appointments            In 9 months Stoioff, Ronda Fairly, MD [place] - Completed PHQ-2 or PHQ-9 in the last 360 days.

## 2019-11-08 ENCOUNTER — Ambulatory Visit (INDEPENDENT_AMBULATORY_CARE_PROVIDER_SITE_OTHER): Payer: PRIVATE HEALTH INSURANCE | Admitting: Psychiatry

## 2019-11-08 ENCOUNTER — Encounter: Payer: Self-pay | Admitting: Psychiatry

## 2019-11-08 ENCOUNTER — Other Ambulatory Visit: Payer: Self-pay

## 2019-11-08 DIAGNOSIS — F4310 Post-traumatic stress disorder, unspecified: Secondary | ICD-10-CM

## 2019-11-08 DIAGNOSIS — F410 Panic disorder [episodic paroxysmal anxiety] without agoraphobia: Secondary | ICD-10-CM | POA: Diagnosis not present

## 2019-11-08 DIAGNOSIS — F3161 Bipolar disorder, current episode mixed, mild: Secondary | ICD-10-CM | POA: Diagnosis not present

## 2019-11-08 DIAGNOSIS — F17200 Nicotine dependence, unspecified, uncomplicated: Secondary | ICD-10-CM

## 2019-11-08 MED ORDER — OLANZAPINE 10 MG PO TABS: 10.0000 mg | ORAL_TABLET | Freq: Every day | ORAL | 0 refills | Status: DC

## 2019-11-08 MED ORDER — BENZTROPINE MESYLATE 0.5 MG PO TABS: 0.5000 mg | ORAL_TABLET | Freq: Every day | ORAL | 0 refills | Status: DC | PRN

## 2019-11-08 NOTE — Progress Notes (Signed)
Virtual Visit via Telephone Note  I connected with Russell Simpson on 11/08/19 at  3:00 PM EST by telephone and verified that I am speaking with the correct person using two identifiers.   I discussed the limitations, risks, security and privacy concerns of performing an evaluation and management service by telephone and the availability of in person appointments. I also discussed with the patient that there may be a patient responsible charge related to this service. The patient expressed understanding and agreed to proceed.     I discussed the assessment and treatment plan with the patient. The patient was provided an opportunity to ask questions and all were answered. The patient agreed with the plan and demonstrated an understanding of the instructions.   The patient was advised to call back or seek an in-person evaluation if the symptoms worsen or if the condition fails to improve as anticipated.   [place] [facility] MD OP Progress Note  11/08/2019 3:41 PM Russell Simpson  MRN:  [id number]  Chief Complaint:  Chief Complaint    Follow-up     HPI: Russell Simpson is a 58 year old Caucasian, divorced, currently on disability, lives in [place], has a history of bipolar disorder, PTSD, panic attacks, hypertension, prediabetic, multiple back surgeries, history of pituitary adenoma, chronic pain was evaluated by phone today.  Patient preferred to do a phone call.  Patient today reports that he is currently struggling with depressive symptoms.  He reports he feels sad, has lack of motivation.  He reports he does not know where it is coming from.  He reports he just got his disability approved and he should have been happy about it.  He reports instead of that he feels sad.  He reports it is likely the pandemic and the current restrictions are making his depressive symptoms worse.  He reports sleep is good on the Zyprexa.  Patient denies any suicidality, homicidality or perceptual disturbances.  He has not spoken to  his therapist in a while however is motivated to restart psychotherapy sessions.  He reports he started taking the Chantix for the smoking cessation.  He however reports he has muscle cramps as well as  lower extremity pain ever since starting the Chantix.  He hence is planning on stopping it.  He is currently working with a Restaurant manager, fast food which helps to some extent.  Patient denies any other concerns today.  He reports his girlfriend continues to be supportive. Visit Diagnosis:    ICD-10-CM   1. Bipolar 1 disorder, mixed, mild (HCC)  F31.61 OLANZapine (ZYPREXA) 10 MG tablet    benztropine (COGENTIN) 0.5 MG tablet  2. PTSD (post-traumatic stress disorder)  F43.10   3. Panic disorder  F41.0   4. Tobacco use disorder  F17.200     Past Psychiatric History: I have reviewed past psychiatric history from my progress note on 07/19/2019.  Past trials of Cymbalta, BuSpar, gabapentin, trazodone, melatonin-nightmares.  Past Medical History:  Past Medical History:  Diagnosis Date  . 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 Arthritis   . Asthma   . Brain tumor (benign) ([place])    benign pituitary neoplasm  . Chronic pain    right arm  . Depression   . Environmental and seasonal allergies   . Hypertension   . Pneumonia   . Sleep apnea    does not wear CPAP ; uses humidifier instead    Past Surgical History:  Procedure Laterality Date  . ANTERIOR CERVICAL DECOMP/DISCECTOMY FUSION N/A 06/17/2016   Procedure: ANTERIOR  CERVICAL DECOMPRESSION FUSION, CERVICAL 3-4, CERVICAL 4-5 WITH INSTRUMENTATION AND ALLOGRAFT;  Surgeon: Phylliss Bob, MD;  Location: [facility];  Service: Orthopedics;  Laterality: N/A;  ANTERIOR CERVICAL DECOMPRESSION FUSION, CERVICAL 3-4, CERVICAL 4-5 WITH INSTRUMENTATION AND ALLOGRAFT  . BACK SURGERY    . NECK SURGERY  2009    Family Psychiatric History: I have reviewed family psychiatric history from my progress note on 07/19/2019.  Family History:  Family History  Problem Relation  Age of Onset  . Diabetes Mother   . Diabetes Other     Social History: Reviewed social history from my progress note on 07/19/2019. Social History   Socioeconomic History  . Marital status: Divorced    Spouse name: Not on file  . Number of children: Not on file  . Years of education: Not on file  . Highest education level: Not on file  Occupational History  . Not on file  Social Needs  . Financial resource strain: Not on file  . Food insecurity    Worry: Not on file    Inability: Not on file  . Transportation needs    Medical: Not on file    Non-medical: Not on file  Tobacco Use  . Smoking status: Light Tobacco Smoker    Types: E-cigarettes, Cigars  . Smokeless tobacco: Never Used  . Tobacco comment: " sometimes during the day and at night"  Substance and Sexual Activity  . Alcohol use: Not Currently    Alcohol/week: 0.0 standard drinks    Comment: 12 pack a beer a night  . Drug use: No  . Sexual activity: Not Currently  Lifestyle  . Physical activity    Days per week: 1 day    Minutes per session: 10 min  . Stress: Rather much  Relationships  . Social Herbalist on phone: Twice a week    Gets together: Once a week    Attends religious service: Never    Active member of club or organization: No    Attends meetings of clubs or organizations: Never    Relationship status: Divorced  Other Topics Concern  . Not on file  Social History Narrative  . Not on file    Allergies:  Allergies  Allergen Reactions  . Penicillins Anaphylaxis  . Tetanus Toxoids Swelling  . Lisinopril     Other reaction(s): Other (See Comments) Other reaction(s): Cough Other reaction(s): Cough Other reaction(s): Cough Other reaction(s): Cough  . Losartan     Other reaction(s): Other (See Comments) Other reaction(s): Muscle Pain Other reaction(s): Muscle Pain Other reaction(s): Muscle Pain  . Tetanus Toxoid   . Codeine Nausea Only  . Doxycycline Rash    Metabolic  Disorder Labs: Lab Results  Component Value Date   HGBA1C 5.8 (A) 05/23/2019   Lab Results  Component Value Date   PROLACTIN 1.3 (L) 12/02/2018   PROLACTIN <1.0 (L) 10/15/2017   Lab Results  Component Value Date   CHOL 255 (H) 02/14/2019   TRIG 155 (H) 02/14/2019   HDL 53 02/14/2019   CHOLHDL 4.8 02/14/2019   LDLCALC 171 (H) 02/14/2019   LDLCALC 108 (H) 10/15/2017   Lab Results  Component Value Date   TSH 3.800 02/14/2019   TSH 3.39 10/15/2017    Therapeutic Level Labs: No results found for: LITHIUM No results found for: VALPROATE No components found for:  CBMZ  Current Medications: Current Outpatient Medications  Medication Sig Dispense Refill  . albuterol (VENTOLIN HFA) 108 (90 Base) MCG/ACT inhaler  INHALE 2 PUFFS BY MOUTH EVERY 6 HOURS ASNEEDED FOR WHEEZING 8.5 g 4  . amLODipine (NORVASC) 5 MG tablet Take 1 tablet (5 mg total) by mouth daily. 90 tablet 3  . Arginine 2000 MG PACK Take by mouth daily.    [name] atorvastatin (LIPITOR) 10 MG tablet Take 1 tablet (10 mg total) by mouth at bedtime. 90 tablet 0  . benztropine (COGENTIN) 0.5 MG tablet Take 1 tablet (0.5 mg total) by mouth daily as needed. For muscle cramps and side effects of olanzapine 90 tablet 0  . Carbonyl Iron (PERFECT IRON) 25 MG TABS Take by mouth daily.    . Coenzyme Q10 (COQ-10) 100 MG CAPS Take by mouth daily.    . colchicine 0.6 MG tablet Take 2 tablets (1.2mg ) by mouth at first sign of gout flare followed by 1 tablet (0.6mg ) after 1 hour. (Max 1.8mg  within 1 hour)    . Cyanocobalamin (B-12) 5000 MCG SUBL Place under the tongue daily.    . cyclobenzaprine (FLEXERIL) 10 MG tablet Take 1 tablet (10 mg total) by mouth 3 (three) times daily as needed for muscle spasms. 30 tablet 0  . DHEA 50 MG CAPS Take by mouth.    . DULoxetine (CYMBALTA) 30 MG capsule TAKE 2 CAPSULES BY MOUTH ONCE DAILY 60 capsule 2  . EUCRISA 2 % OINT Apply  a small amount to affected area   qd/bid to aa's chest, inframammary, eye    .  famotidine (PEPCID) 20 MG tablet Take 1 tablet (20 mg total) by mouth 2 (two) times daily. 180 tablet 0  . finasteride (PROSCAR) 5 MG tablet Take 1 tablet (5 mg total) by mouth daily. 90 tablet 3  . fluticasone (FLONASE) 50 MCG/ACT nasal spray Place into the nose.    . gabapentin (NEURONTIN) 300 MG capsule Take 1 capsule (300 mg total) by mouth 2 (two) times daily. 180 capsule 0  . Ginger, Zingiber officinalis, (GINGER PO) Take by mouth.    . Glucosamine 500 MG CAPS Take by mouth daily.    . hydrOXYzine (VISTARIL) 25 MG capsule     . indomethacin (INDOCIN) 50 MG capsule TAKE 1 CAPSULE BY MOUTH 3 TIMES PER DAY WITH MEALS 90 capsule 5  . loratadine (CLARITIN) 10 MG tablet Take 10 mg by mouth daily.     . magnesium oxide (MAG-OX) 400 MG tablet Take 400 mg by mouth daily.    . methocarbamol (ROBAXIN) 500 MG tablet Take 1 tablet (500 mg total) by mouth every 8 (eight) hours as needed for muscle spasms. 30 tablet 0  . metoprolol succinate (TOPROL-XL) 25 MG 24 hr tablet TAKE 1 TABLET BY MOUTH ONCE DAILY 30 tablet 3  . montelukast (SINGULAIR) 10 MG tablet Take 1 tablet (10 mg total) by mouth at bedtime. 30 tablet 5  . naproxen sodium (ANAPROX) 220 MG tablet Take 220 mg by mouth 2 (two) times daily with a meal.    . OLANZapine (ZYPREXA) 10 MG tablet Take 1 tablet (10 mg total) by mouth at bedtime. 90 tablet 0  . Omega-3 Fatty Acids (FISH OIL) 1000 MG CAPS Take by mouth.    [name] omeprazole (PRILOSEC) 20 MG capsule TAKE 1 CAPSULE BY MOUTH TWICE DAILY BEFORE MEALS 60 capsule 2  . OVER THE COUNTER MEDICATION OREGA MAX    . OVER THE COUNTER MEDICATION RED MARINE ALGAE-375 MG.    . predniSONE (DELTASONE) 10 MG tablet Take tablets by mouth on a taper starting 6 day 1, 5 day 2,  4 day 3, 3 day 4, 2 day 5 and 1 day 6 (spread out doses between meals). 21 tablet 0  . sildenafil (REVATIO) 20 MG tablet 3-5 tablets daily as needed for erectile dysfunction 30 tablet 3  . tacrolimus (PROTOPIC) 0.1 % ointment Apply 1  application topically 2 (two) times daily.    . tamsulosin (FLOMAX) 0.4 MG CAPS capsule Take 1 capsule (0.4 mg total) by mouth daily. 90 capsule 3  . testosterone cypionate (DEPOTESTOSTERONE CYPIONATE) 200 MG/ML injection Inject 0.5 mLs (100 mg total) into the muscle once a week. 10 mL 0  . triamcinolone cream (KENALOG) 0.1 % Apply 1 application topically 2 (two) times daily. 30 g 0   No current facility-administered medications for this visit.      Musculoskeletal: Strength & Muscle Tone: UTA Gait & Station: Reports as WNL Patient leans: N/A  Psychiatric Specialty Exam: Review of Systems  Musculoskeletal:       Muscle cramps of his lower back , upper thigh- BL  Psychiatric/Behavioral: Positive for depression.  All other systems reviewed and are negative.   There were no vitals taken for this visit.There is no height or weight on file to calculate BMI.  General Appearance: UTA  Eye Contact:  UTA  Speech:  Clear and Coherent  Volume:  Normal  Mood:  Depressed  Affect:  UTA  Thought Process:  Goal Directed and Descriptions of Associations: Intact  Orientation:  Full (Time, Place, and Person)  Thought Content: Logical   Suicidal Thoughts:  No  Homicidal Thoughts:  No  Memory:  Immediate;   Fair Recent;   Fair Remote;   Fair  Judgement:  Intact  Insight:  Fair  Psychomotor Activity:  UTA  Concentration:  Concentration: Fair and Attention Span: Fair  Recall:  [employer] of Knowledge: Fair  Language: Fair  Akathisia:  No  Handed:  Right  AIMS (if indicated):Has muscle cramps   Assets:  Communication Skills Desire for Improvement Housing Intimacy Social Support  ADL's:  Intact  Cognition: WNL  Sleep:  Fair   Screenings: PHQ2-9     Chronic Care Management from 03/27/2019 in [facility] Visit from 02/14/2019 in [facility] Visit from 12/29/2018 in [facility] Visit from 09/01/2017 in [facility]  PHQ-2 Total Score  2  5  0  4  PHQ-9 Total Score  10  18  -  21       Assessment and Plan: Russell Simpson is a 58 year old Caucasian male, on disability, lives in [place], divorced, has a history of multiple medical problems including bipolar disorder, PTSD, panic attacks, 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  Patient is biologically predisposed given his family history, history of trauma and multiple medical problems.  He also has psychosocial stressors of financial problems, COVID-19 outbreak.  Patient currently struggles with depressive symptoms and muscular cramps.  Patient will benefit from medication readjustment and psychotherapy sessions.  Plan as noted below.  Plan Bipolar disorder-improving Increase olanzapine to 10 mg p.o. nightly. Gabapentin 300 mg p.o. twice daily Cymbalta 60 mg p.o. daily Patient reports muscular cramps unknown if it is due to olanzapine or the Chantix.  Patient reports it started after he started the Chantix.  Patient is planning on discontinuing Chantix.  He currently works with a Restaurant manager, fast food. We will start Cogentin 0.5 mg p.o. daily as needed for muscle cramps.  Discussed with patient it is likely also could  be due to his olanzapine.  PTSD-improving Patient to restart psychotherapy sessions with Ms. Alden Hipp Cymbalta as prescribed  Panic attacks-improving Patient to continue psychotherapy sessions.  Insomnia-improving Sleep study pending Olanzapine will also help.  Alcohol use disorder in remission-patient has been sober since May 2020.  Tobacco use disorder-improving Discontinue Chantix for side effects Provided smoking cessation counseling. Provided information for [facility] quit now program.  Follow-up in clinic in 1 month or sooner if needed.  January 28 at 11:20 AM  I have spent atleast 15 minutes non face to face with patient today. More than 50 % of the time was spent for psychoeducation  and supportive psychotherapy and care coordination. This note was generated in part or whole with voice recognition software. Voice recognition is usually quite accurate but there are transcription errors that can and very often do occur. I apologize for any typographical errors that were not detected and corrected.      Ursula Alert, MD 11/08/2019, 3:41 PM

## 2019-11-21 ENCOUNTER — Other Ambulatory Visit: Payer: Self-pay

## 2019-11-21 ENCOUNTER — Emergency Department: Payer: PRIVATE HEALTH INSURANCE

## 2019-11-21 ENCOUNTER — Encounter: Payer: Self-pay | Admitting: Emergency Medicine

## 2019-11-21 ENCOUNTER — Emergency Department
Admission: EM | Admit: 2019-11-21 | Discharge: 2019-11-21 | Disposition: A | Discharge status: Other Acute Inpatient Hospital | Payer: PRIVATE HEALTH INSURANCE | Attending: Student | Admitting: Student

## 2019-11-21 DIAGNOSIS — J45909 Unspecified asthma, uncomplicated: Secondary | ICD-10-CM | POA: Insufficient documentation

## 2019-11-21 DIAGNOSIS — Y929 Unspecified place or not applicable: Secondary | ICD-10-CM | POA: Insufficient documentation

## 2019-11-21 DIAGNOSIS — I10 Essential (primary) hypertension: Secondary | ICD-10-CM | POA: Diagnosis not present

## 2019-11-21 DIAGNOSIS — Z20828 Contact with and (suspected) exposure to other viral communicable diseases: Secondary | ICD-10-CM | POA: Insufficient documentation

## 2019-11-21 DIAGNOSIS — W01190A Fall on same level from slipping, tripping and stumbling with subsequent striking against furniture, initial encounter: Secondary | ICD-10-CM | POA: Insufficient documentation

## 2019-11-21 DIAGNOSIS — S020XXA Fracture of vault of skull, initial encounter for closed fracture: Secondary | ICD-10-CM

## 2019-11-21 DIAGNOSIS — Y939 Activity, unspecified: Secondary | ICD-10-CM | POA: Insufficient documentation

## 2019-11-21 DIAGNOSIS — W19XXXA Unspecified fall, initial encounter: Secondary | ICD-10-CM

## 2019-11-21 DIAGNOSIS — Z79899 Other long term (current) drug therapy: Secondary | ICD-10-CM | POA: Insufficient documentation

## 2019-11-21 DIAGNOSIS — Y999 Unspecified external cause status: Secondary | ICD-10-CM | POA: Diagnosis not present

## 2019-11-21 DIAGNOSIS — F17290 Nicotine dependence, other tobacco product, uncomplicated: Secondary | ICD-10-CM | POA: Diagnosis not present

## 2019-11-21 DIAGNOSIS — S0990XA Unspecified injury of head, initial encounter: Secondary | ICD-10-CM | POA: Diagnosis present

## 2019-11-21 LAB — CBC WITH DIFFERENTIAL/PLATELET
Abs Immature Granulocytes: 0.04 10*3/uL (ref 0.00–0.07)
Basophils Absolute: 0 10*3/uL (ref 0.0–0.1)
Basophils Relative: 0 %
Eosinophils Absolute: 0.1 10*3/uL (ref 0.0–0.5)
Eosinophils Relative: 1 %
HCT: 46.8 % (ref 39.0–52.0)
Hemoglobin: 16.3 g/dL (ref 13.0–17.0)
Immature Granulocytes: 0 %
Lymphocytes Relative: 22 %
Lymphs Abs: 2.2 10*3/uL (ref 0.7–4.0)
MCH: 31 pg (ref 26.0–34.0)
MCHC: 34.8 g/dL (ref 30.0–36.0)
MCV: 89 fL (ref 80.0–100.0)
Monocytes Absolute: 1 10*3/uL (ref 0.1–1.0)
Monocytes Relative: 10 %
Neutro Abs: 6.6 10*3/uL (ref 1.7–7.7)
Neutrophils Relative %: 67 %
Platelets: 163 10*3/uL (ref 150–400)
RBC: 5.26 MIL/uL (ref 4.22–5.81)
RDW: 12.4 % (ref 11.5–15.5)
WBC: 9.9 10*3/uL (ref 4.0–10.5)
nRBC: 0 % (ref 0.0–0.2)

## 2019-11-21 LAB — RESPIRATORY PANEL BY RT PCR (FLU A&B, COVID)
Influenza A by PCR: NEGATIVE
Influenza B by PCR: NEGATIVE
SARS Coronavirus 2 by RT PCR: NEGATIVE

## 2019-11-21 LAB — PROTIME-INR
INR: 1 (ref 0.8–1.2)
Prothrombin Time: 12.9 seconds (ref 11.4–15.2)

## 2019-11-21 LAB — BASIC METABOLIC PANEL
Anion gap: 19 — ABNORMAL HIGH (ref 5–15)
BUN: 14 mg/dL (ref 6–20)
CO2: 17 mmol/L — ABNORMAL LOW (ref 22–32)
Calcium: 9 mg/dL (ref 8.9–10.3)
Chloride: 94 mmol/L — ABNORMAL LOW (ref 98–111)
Creatinine, Ser: 1.12 mg/dL (ref 0.61–1.24)
GFR calc Af Amer: >60 mL/min (ref >60)
GFR calc non Af Amer: >60 mL/min (ref >60)
Glucose, Bld: 153 mg/dL — ABNORMAL HIGH (ref 70–99)
Potassium: 3.7 mmol/L (ref 3.5–5.1)
Sodium: 130 mmol/L — ABNORMAL LOW (ref 135–145)

## 2019-11-21 LAB — ETHANOL: Alcohol, Ethyl (B): 17 mg/dL — ABNORMAL HIGH (ref <10)

## 2019-11-21 MED ORDER — FENTANYL CITRATE (PF) 100 MCG/2ML IJ SOLN
50.0000 ug | Freq: Once | INTRAMUSCULAR | Status: AC
  Administered 2019-11-21: 04:00:00 50 ug via INTRAVENOUS
  Filled 2019-11-21: qty 2

## 2019-11-21 MED ORDER — ONDANSETRON HCL 4 MG/2ML IJ SOLN
4.0000 mg | Freq: Once | INTRAMUSCULAR | Status: AC
  Administered 2019-11-21: 4 mg via INTRAVENOUS
  Filled 2019-11-21: qty 2

## 2019-11-21 NOTE — ED Provider Notes (Signed)
[facility] Emergency Department Provider Note  ____________________________________________   First MD Initiated Contact with Patient 11/21/19 0254     (approximate)  I have reviewed the triage vital signs and the nursing notes.  History  Chief Complaint Fall    HPI Russell Simpson is a 58 y.o. male history bipolar disorder, PTSD, status post cervical ACDF, who presents for a fall. Occurred about 1 hour PTA.   Patient states he slipped on a towel that was on a tile floor, causing him to fall forward.  He hit his head/face against a chair.  He reports positive loss of consciousness and initial epistaxis, now resolved.  He reports intermittent double vision, improved with closing one eye. No vomiting.  No weakness, numbness, tingling.  He is not on any blood thinning medications.  He reports 10/10 pain to the forehead and neck area.  Aching, sharp.  Constant since onset after falling 1 hour prior to arrival.  No radiation.  No alleviating/aggravating factors. Did not take anything PTA.   Past Medical Hx Past Medical History:  Diagnosis Date  . 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 Arthritis   . Asthma   . Brain tumor (benign) ([place])    benign pituitary neoplasm  . Chronic pain    right arm  . Depression   . Environmental and seasonal allergies   . Hypertension   . Pneumonia   . Sleep apnea    does not wear CPAP ; uses humidifier instead    Problem List Patient Active Problem List   Diagnosis Date Noted  . Erectile dysfunction due to arterial insufficiency 08/10/2019  . Bipolar 1 disorder, mixed, mild ([place]) 07/19/2019  . PTSD (post-traumatic stress disorder) 07/19/2019  . Panic disorder 07/19/2019  . Tobacco use disorder 07/19/2019  . Moderate episode of recurrent major depressive disorder ([place]) 05/25/2019  . S/P lumbar fusion 02/22/2019  . Alcohol use disorder, severe, in early remission ([place]) 01/01/2019  . Disorder of bursae of shoulder  region 07/21/2017  . Full thickness rotator cuff tear 07/21/2017  . Localized, primary osteoarthritis 07/21/2017  . 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 Osteoarthritis of knee 07/21/2017  . Anxiety 01/10/2016  . BP (high blood pressure) 01/10/2016  . Apnea, sleep 01/10/2016  . Hyperlipidemia 10/15/2015  . Pituitary adenoma ([place]) 10/15/2015  . Hypogonadism in male 10/15/2015  . Depression 10/15/2015  . 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 Allergic rhinitis 10/15/2015  . Incomplete bladder emptying 05/30/2015  . Hernia, inguinal, left 09/21/2014  . Benign prostatic hyperplasia with lower urinary tract symptoms 10/24/2012  . 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 OSTEOMYELITIS, ACUTE, OTHER [facility] SITE 06/09/2006    Past Surgical Hx Past Surgical History:  Procedure Laterality Date  . ANTERIOR CERVICAL DECOMP/DISCECTOMY FUSION N/A 06/17/2016   Procedure: ANTERIOR CERVICAL DECOMPRESSION FUSION, CERVICAL 3-4, CERVICAL 4-5 WITH INSTRUMENTATION AND ALLOGRAFT;  Surgeon: Phylliss Bob, MD;  Location: [place];  Service: Orthopedics;  Laterality: N/A;  ANTERIOR CERVICAL DECOMPRESSION FUSION, CERVICAL 3-4, CERVICAL 4-5 WITH INSTRUMENTATION AND ALLOGRAFT  . BACK SURGERY    . NECK SURGERY  2009    Medications Prior to Admission medications   Medication Sig Start Date End Date Taking? Authorizing Provider  albuterol (VENTOLIN HFA) 108 (90 Base) MCG/ACT inhaler INHALE 2 PUFFS BY MOUTH EVERY 6 HOURS ASNEEDED FOR WHEEZING 10/19/19   Carles Collet M, PA-C  amLODipine (NORVASC) 5 MG tablet Take 1 tablet (5 mg total) by mouth daily. 10/03/19   Chrismon,  Vickki Muff, PA  Arginine 2000 MG PACK Take by mouth daily.    [provider]  atorvastatin (LIPITOR) 10 MG tablet Take 1 tablet (10 mg total) by mouth at bedtime. 02/20/19   Trinna Post, PA-C  benztropine (COGENTIN) 0.5 MG tablet Take 1 tablet (0.5 mg total) by mouth daily as needed. For muscle cramps and side effects of  olanzapine 11/08/19   Ursula Alert, MD  Carbonyl Iron (PERFECT IRON) 25 MG TABS Take by mouth daily.    [provider]  Coenzyme Q10 (COQ-10) 100 MG CAPS Take by mouth daily.    [provider]  colchicine 0.6 MG tablet Take 2 tablets (1.2mg ) by mouth at first sign of gout flare followed by 1 tablet (0.6mg ) after 1 hour. (Max 1.8mg  within 1 hour) 12/12/18   [provider]  Cyanocobalamin (B-12) 5000 MCG SUBL Place under the tongue daily.    [provider]  cyclobenzaprine (FLEXERIL) 10 MG tablet Take 1 tablet (10 mg total) by mouth 3 (three) times daily as needed for muscle spasms. 10/24/19   Chrismon, Vickki Muff, PA  DHEA 50 MG CAPS Take by mouth.    [provider]  DULoxetine (CYMBALTA) 30 MG capsule TAKE 2 CAPSULES BY MOUTH ONCE DAILY 11/06/19   Trinna Post, PA-C  EUCRISA 2 % OINT Apply  a small amount to affected area   qd/bid to aa's chest, inframammary, eye 08/30/19   [provider]  famotidine (PEPCID) 20 MG tablet Take 1 tablet (20 mg total) by mouth 2 (two) times daily. 07/27/19 10/25/19  Trinna Post, PA-C  finasteride (PROSCAR) 5 MG tablet Take 1 tablet (5 mg total) by mouth daily. 12/05/18   Stoioff, Ronda Fairly, MD  fluticasone (FLONASE) 50 MCG/ACT nasal spray Place into the nose. 01/06/14   [provider]  gabapentin (NEURONTIN) 300 MG capsule Take 1 capsule (300 mg total) by mouth 2 (two) times daily. 09/22/19   Ursula Alert, MD  Ginger, Zingiber officinalis, (GINGER PO) Take by mouth.    [provider]  Glucosamine 500 MG CAPS Take by mouth daily.    [provider]  hydrOXYzine (VISTARIL) 25 MG capsule  10/15/19   [provider]  indomethacin (INDOCIN) 50 MG capsule TAKE 1 CAPSULE BY MOUTH 3 TIMES PER DAY WITH MEALS 03/17/19   Jerrol Banana., MD  loratadine (CLARITIN) 10 MG tablet Take 10 mg by mouth daily.  12/18/14   [provider]  magnesium oxide (MAG-OX) 400 MG  tablet Take 400 mg by mouth daily.    [provider]  methocarbamol (ROBAXIN) 500 MG tablet Take 1 tablet (500 mg total) by mouth every 8 (eight) hours as needed for muscle spasms. 10/02/19   Chrismon, Vickki Muff, PA  metoprolol succinate (TOPROL-XL) 25 MG 24 hr tablet TAKE 1 TABLET BY MOUTH ONCE DAILY 10/19/19   Jerrol Banana., MD  montelukast (SINGULAIR) 10 MG tablet Take 1 tablet (10 mg total) by mouth at bedtime. 09/18/19   Trinna Post, PA-C  naproxen sodium (ANAPROX) 220 MG tablet Take 220 mg by mouth 2 (two) times daily with a meal.    [provider]  OLANZapine (ZYPREXA) 10 MG tablet Take 1 tablet (10 mg total) by mouth at bedtime. 11/08/19   Ursula Alert, MD  Omega-3 Fatty Acids (FISH OIL) 1000 MG CAPS Take by mouth.    [provider]  omeprazole (PRILOSEC) 20 MG capsule TAKE 1 CAPSULE BY MOUTH  TWICE DAILY BEFORE MEALS 09/19/19   Pollak, Wendee Beavers, PA-C  OVER THE COUNTER MEDICATION OREGA MAX    [provider]  OVER THE COUNTER MEDICATION RED MARINE ALGAE-375 MG.    [provider]  predniSONE (DELTASONE) 10 MG tablet Take tablets by mouth on a taper starting 6 day 1, 5 day 2, 4 day 3, 3 day 4, 2 day 5 and 1 day 6 (spread out doses between meals). 10/24/19   Chrismon, Vickki Muff, PA  sildenafil (REVATIO) 20 MG tablet 3-5 tablets daily as needed for erectile dysfunction 08/10/19   Stoioff, Ronda Fairly, MD  tacrolimus (PROTOPIC) 0.1 % ointment Apply 1 application topically 2 (two) times daily. 09/11/19   [provider]  tamsulosin (FLOMAX) 0.4 MG CAPS capsule Take 1 capsule (0.4 mg total) by mouth daily. 08/10/19   Stoioff, Ronda Fairly, MD  testosterone cypionate (DEPOTESTOSTERONE CYPIONATE) 200 MG/ML injection Inject 0.5 mLs (100 mg total) into the muscle once a week. 08/10/19   Stoioff, Ronda Fairly, MD  triamcinolone cream (KENALOG) 0.1 % Apply 1 application topically 2 (two) times daily. 05/23/19   Trinna Post, PA-C     Allergies Penicillins, Tetanus toxoids, Lisinopril, Losartan, Tetanus toxoid, Codeine, and Doxycycline  Family Hx Family History  Problem Relation Age of Onset  . Diabetes Mother   . Diabetes Other     Social Hx Social History   Tobacco Use  . Smoking status: Light Tobacco Smoker    Types: E-cigarettes, Cigars  . Smokeless tobacco: Never Used  . Tobacco comment: " sometimes during the day and at night"  Substance Use Topics  . Alcohol use: Not Currently    Alcohol/week: 0.0 standard drinks    Comment: 12 pack a beer a night  . Drug use: No     Review of Systems  Constitutional: Negative for fever, chills. + head injury Eyes: Negative for visual changes. ENT: Negative for sore throat. Cardiovascular: Negative for chest pain. Respiratory: Negative for shortness of breath. Gastrointestinal: Negative for nausea, vomiting.  Genitourinary: Negative for dysuria. Musculoskeletal: Negative for leg swelling. Skin: + ecchymosis  Neurological: Negative for for headaches.   Physical Exam  Vital Signs: ED Triage Vitals  Enc Vitals Group     BP 11/21/19 0207 119/81     Pulse Rate 11/21/19 0207 98     Resp 11/21/19 0207 20     Temp 11/21/19 0207 (!) 97.5 F (36.4 C)     Temp Source 11/21/19 0207 Oral     SpO2 11/21/19 0207 97 %     Weight 11/21/19 0205 240 lb (108.9 kg)     Height 11/21/19 0205 6\' 2"  (1.88 m)     Head Circumference --      Peak Flow --      Pain Score 11/21/19 0205 10     Pain Loc --      Pain Edu? --      Excl. in [place]? --     Constitutional: Alert and oriented x 3.  Head: Palpable deformity, step off to the LEFT forehead, frontal area with overlying ecchymosis.  Ecchymosis over the left lateral cheek/midface.  Midface is stable. Eyes: Subconjunctival hemorrhage on the left.  EOMI.  PERRL.  No visible hyphema. Ears: No hemotympanum. Nose: No congestion. No rhinorrhea. Mouth/Throat: Wearing mask.  No intraoral or dental trauma.  Jaw is well  aligned. Neck: No stridor.  C-collar in place. Cardiovascular: Normal rate, regular rhythm. Extremities well perfused. Respiratory: Normal respiratory effort.  Lungs CTAB. Gastrointestinal: Soft. Non-tender. Non-distended.  Musculoskeletal: No lower extremity edema. No deformities. Neurologic:  Normal speech and language. No gross focal neurologic deficits are appreciated.  Upper extremity, lower extremity strength 5/5 and symmetric.  SIL T. Skin: Ecchymosis as above. Psychiatric: Mood and affect are appropriate for situation.  EKG  N/A    Radiology  CT: IMPRESSION:  1. No acute intracranial abnormality.  2. Comminuted depressed fracture of the left frontal skull involving  the frontal sinus with 8 mm of cortical depression. Overlying soft  tissue swelling with subcutaneous emphysema.  3. Blood and fluid within the left frontal sinus and ethmoid air  cells.  4. No acute fracture or malalignment of the spine.  5. Status post ACDF from C3 through C5 without hardware  complication.    Procedures  Procedure(s) performed (including critical care):  .Critical Care Performed by: Lilia Pro., MD Authorized by: Lilia Pro., MD   Critical care provider statement:    Critical care time (minutes):  45   Critical care was necessary to treat or prevent imminent or life-threatening deterioration of the following conditions:  CNS failure or compromise and trauma   Critical care was time spent personally by me on the following activities:  Discussions with consultants, evaluation of patient's response to treatment, examination of patient, ordering and performing treatments and interventions, ordering and review of laboratory studies, ordering and review of radiographic studies, pulse oximetry, re-evaluation of patient's condition, obtaining history from patient or surrogate and review of old charts     Initial Impression / Assessment and Plan / ED Course  58 y.o. male who  presents to the ED for non-syncopal fall with resultant head and face injury.   Imaging reveals comminuted, depressed fracture of the left frontal skull involving the frontal sinus with cortical depression.  Discussed case with NSGY, who recommends transfer for further observation and likely operative intervention.  No need for antibiotics or antiepileptics at this time.  Discussed these recommendations with the patient, who requests transfer to [facility] if available.  Will contact [facility] transfer center.  Patient accepted for transfer to [facility].   Final Clinical Impression(s) / ED Diagnosis  Final diagnoses:  Fall, initial encounter  Closed fracture of frontal bone, initial encounter [facility])       Note:  This document was prepared using Dragon voice recognition software and may include unintentional dictation errors.   Lilia Pro., MD 11/21/19 [id number]

## 2019-11-21 NOTE — ED Notes (Signed)
This RN spoke w/ pt's girlfriend and updated on POC. Given ok by pt.   Gustavus Bryant: [phone]

## 2019-11-21 NOTE — ED Triage Notes (Addendum)
Patient ambulatory to triage with steady gait, without difficulty or distress noted, mask in place; pt reports slipped on tile, hitting a chair PTA; +LOC and initial epistaxis and having "double vision" ; bruising noted to left side forehead and cheek; c/o HA with neck & back and facial pain with hx neck surgery; c-collar applied

## 2019-11-23 ENCOUNTER — Encounter: Payer: Self-pay | Admitting: Physician Assistant

## 2019-11-23 ENCOUNTER — Ambulatory Visit (INDEPENDENT_AMBULATORY_CARE_PROVIDER_SITE_OTHER): Payer: PRIVATE HEALTH INSURANCE | Admitting: Physician Assistant

## 2019-11-23 ENCOUNTER — Other Ambulatory Visit: Payer: Self-pay

## 2019-11-23 ENCOUNTER — Telehealth: Payer: Self-pay | Admitting: Physician Assistant

## 2019-11-23 DIAGNOSIS — R05 Cough: Secondary | ICD-10-CM | POA: Diagnosis not present

## 2019-11-23 DIAGNOSIS — R059 Cough, unspecified: Secondary | ICD-10-CM

## 2019-11-23 MED ORDER — BENZONATATE 200 MG PO CAPS: 200.0000 mg | ORAL_CAPSULE | Freq: Three times a day (TID) | ORAL | 0 refills | Status: DC | PRN

## 2019-11-23 NOTE — Progress Notes (Signed)
Patient: Russell Simpson Male    DOB: 11-21-1961   58 y.o.   MRN: [id number] Visit Date: 11/23/2019  Today's Provider: Mar Daring, PA-C   Chief Complaint  Patient presents with  . URI   Subjective:     URI  This is a new problem. The current episode started in the past 7 days. The problem has been gradually worsening. There has been no fever. Associated symptoms include congestion, coughing, sneezing and swollen glands. Pertinent negatives include no abdominal pain, diarrhea, ear pain, headaches, nausea, plugged ear sensation, rhinorrhea, sinus pain, sore throat or wheezing.  Symptoms are only bothersome at night when he lies down. He has used his albuterol inhaler at bedtime, but this has not helped. He is also taking Mucinex for the congestion.   Was covid-19 tested on 11/21/19 at the hospital and was negative.  Virtual Visit via Video Note  I connected with Russell Simpson on 11/23/19 at 11:00 AM EST by a video enabled telemedicine application and verified that I am speaking with the correct person using two identifiers.  Location: Patient: Home Provider: [facility]   I discussed the limitations of evaluation and management by telemedicine and the availability of in person appointments. The patient expressed understanding and agreed to proceed.   Allergies  Allergen Reactions  . Penicillins Anaphylaxis  . Tetanus Toxoids Swelling  . Lisinopril     Other reaction(s): Other (See Comments) Other reaction(s): Cough Other reaction(s): Cough Other reaction(s): Cough Other reaction(s): Cough  . Losartan     Other reaction(s): Other (See Comments) Other reaction(s): Muscle Pain Other reaction(s): Muscle Pain Other reaction(s): Muscle Pain  . Tetanus Toxoid   . Codeine Nausea Only  . Doxycycline Rash     Current Outpatient Medications:  .  albuterol (VENTOLIN HFA) 108 (90 Base) MCG/ACT inhaler, INHALE 2 PUFFS BY MOUTH EVERY 6 HOURS ASNEEDED FOR WHEEZING, Disp:  8.5 g, Rfl: 4 .  amLODipine (NORVASC) 5 MG tablet, Take 1 tablet (5 mg total) by mouth daily., Disp: 90 tablet, Rfl: 3 .  Arginine 2000 MG PACK, Take by mouth daily., Disp: , Rfl:  .  atorvastatin (LIPITOR) 10 MG tablet, Take 1 tablet (10 mg total) by mouth at bedtime., Disp: 90 tablet, Rfl: 0 .  benztropine (COGENTIN) 0.5 MG tablet, Take 1 tablet (0.5 mg total) by mouth daily as needed. For muscle cramps and side effects of olanzapine, Disp: 90 tablet, Rfl: 0 .  Carbonyl Iron (PERFECT IRON) 25 MG TABS, Take by mouth daily., Disp: , Rfl:  .  Coenzyme Q10 (COQ-10) 100 MG CAPS, Take by mouth daily., Disp: , Rfl:  .  colchicine 0.6 MG tablet, Take 2 tablets (1.2mg ) by mouth at first sign of gout flare followed by 1 tablet (0.6mg ) after 1 hour. (Max 1.8mg  within 1 hour), Disp: , Rfl:  .  Cyanocobalamin (B-12) 5000 MCG SUBL, Place under the tongue daily., Disp: , Rfl:  .  cyclobenzaprine (FLEXERIL) 10 MG tablet, Take 1 tablet (10 mg total) by mouth 3 (three) times daily as needed for muscle spasms., Disp: 30 tablet, Rfl: 0 .  DHEA 50 MG CAPS, Take by mouth., Disp: , Rfl:  .  DULoxetine (CYMBALTA) 30 MG capsule, TAKE 2 CAPSULES BY MOUTH ONCE DAILY, Disp: 60 capsule, Rfl: 2 .  EUCRISA 2 % OINT, Apply  a small amount to affected area   qd/bid to aa's chest, inframammary, eye, Disp: , Rfl:  .  finasteride (PROSCAR) 5  MG tablet, Take 1 tablet (5 mg total) by mouth daily., Disp: 90 tablet, Rfl: 3 .  fluticasone (FLONASE) 50 MCG/ACT nasal spray, Place into the nose., Disp: , Rfl:  .  gabapentin (NEURONTIN) 300 MG capsule, Take 1 capsule (300 mg total) by mouth 2 (two) times daily., Disp: 180 capsule, Rfl: 0 .  Ginger, Zingiber officinalis, (GINGER PO), Take by mouth., Disp: , Rfl:  .  Glucosamine 500 MG CAPS, Take by mouth daily., Disp: , Rfl:  .  hydrOXYzine (VISTARIL) 25 MG capsule, , Disp: , Rfl:  .  indomethacin (INDOCIN) 50 MG capsule, TAKE 1 CAPSULE BY MOUTH 3 TIMES PER DAY WITH MEALS, Disp: 90 capsule,  Rfl: 5 .  loratadine (CLARITIN) 10 MG tablet, Take 10 mg by mouth daily. , Disp: , Rfl:  .  magnesium oxide (MAG-OX) 400 MG tablet, Take 400 mg by mouth daily., Disp: , Rfl:  .  methocarbamol (ROBAXIN) 500 MG tablet, Take 1 tablet (500 mg total) by mouth every 8 (eight) hours as needed for muscle spasms., Disp: 30 tablet, Rfl: 0 .  metoprolol succinate (TOPROL-XL) 25 MG 24 hr tablet, TAKE 1 TABLET BY MOUTH ONCE DAILY, Disp: 30 tablet, Rfl: 3 .  montelukast (SINGULAIR) 10 MG tablet, Take 1 tablet (10 mg total) by mouth at bedtime., Disp: 30 tablet, Rfl: 5 .  naproxen sodium (ANAPROX) 220 MG tablet, Take 220 mg by mouth 2 (two) times daily with a meal., Disp: , Rfl:  .  OLANZapine (ZYPREXA) 10 MG tablet, Take 1 tablet (10 mg total) by mouth at bedtime., Disp: 90 tablet, Rfl: 0 .  Omega-3 Fatty Acids (FISH OIL) 1000 MG CAPS, Take by mouth., Disp: , Rfl:  .  omeprazole (PRILOSEC) 20 MG capsule, TAKE 1 CAPSULE BY MOUTH TWICE DAILY BEFORE MEALS, Disp: 60 capsule, Rfl: 2 .  OVER THE COUNTER MEDICATION, OREGA MAX, Disp: , Rfl:  .  OVER THE COUNTER MEDICATION, RED MARINE ALGAE-375 MG., Disp: , Rfl:  .  predniSONE (DELTASONE) 10 MG tablet, Take tablets by mouth on a taper starting 6 day 1, 5 day 2, 4 day 3, 3 day 4, 2 day 5 and 1 day 6 (spread out doses between meals)., Disp: 21 tablet, Rfl: 0 .  sildenafil (REVATIO) 20 MG tablet, 3-5 tablets daily as needed for erectile dysfunction, Disp: 30 tablet, Rfl: 3 .  tacrolimus (PROTOPIC) 0.1 % ointment, Apply 1 application topically 2 (two) times daily., Disp: , Rfl:  .  tamsulosin (FLOMAX) 0.4 MG CAPS capsule, Take 1 capsule (0.4 mg total) by mouth daily., Disp: 90 capsule, Rfl: 3 .  testosterone cypionate (DEPOTESTOSTERONE CYPIONATE) 200 MG/ML injection, Inject 0.5 mLs (100 mg total) into the muscle once a week., Disp: 10 mL, Rfl: 0 .  triamcinolone cream (KENALOG) 0.1 %, Apply 1 application topically 2 (two) times daily., Disp: 30 g, Rfl: 0 .  famotidine  (PEPCID) 20 MG tablet, Take 1 tablet (20 mg total) by mouth 2 (two) times daily., Disp: 180 tablet, Rfl: 0  Review of Systems  Constitutional: Positive for fatigue. Negative for activity change, appetite change, chills, diaphoresis, fever and unexpected weight change.  HENT: Positive for congestion, sneezing and voice change. Negative for ear discharge, ear pain, hearing loss, postnasal drip, rhinorrhea, sinus pressure, sinus pain, sore throat, tinnitus and trouble swallowing.   Eyes: Negative.   Respiratory: Positive for cough. Negative for apnea, choking, chest tightness, shortness of breath, wheezing and stridor.   Gastrointestinal: Negative.  Negative for abdominal pain, diarrhea and  nausea.  Neurological: Negative for dizziness, light-headedness and headaches.    Social History   Tobacco Use  . Smoking status: Light Tobacco Smoker    Types: E-cigarettes, Cigars  . Smokeless tobacco: Never Used  . Tobacco comment: " sometimes during the day and at night"  Substance Use Topics  . Alcohol use: Not Currently    Alcohol/week: 0.0 standard drinks    Comment: 12 pack a beer a night      Objective:   There were no vitals taken for this visit. There were no vitals filed for this visit.There is no height or weight on file to calculate BMI.   Physical Exam Vitals reviewed.  Constitutional:      General: He is not in acute distress. Pulmonary:     Effort: No respiratory distress.  Neurological:     Mental Status: He is alert.      No results found for any visits on 11/23/19.     Assessment & Plan    1. Cough Continue Mucinex as needed. Use albuterol inhaler as needed. Tessalon perles sent for nighttime use. Call if worsening.  - benzonatate (TESSALON) 200 MG capsule; Take 1 capsule (200 mg total) by mouth 3 (three) times daily as needed.  Dispense: 30 capsule; Refill: 0  I discussed the assessment and treatment plan with the patient. The patient was provided an opportunity  to ask questions and all were answered. The patient agreed with the plan and demonstrated an understanding of the instructions.   The patient was advised to call back or seek an in-person evaluation if the symptoms worsen or if the condition fails to improve as anticipated.  I provided 7 minutes of non-face-to-face time during this encounter.     Mar Daring, PA-C  [facility]

## 2019-11-23 NOTE — Telephone Encounter (Signed)
Pt called saying he has a cough when he is laying down and would like a prescription of Tessalon pearls sen to [facility].  CB#  [phone]

## 2019-11-23 NOTE — Telephone Encounter (Signed)
Apt made for today 11/23/2019 at 11am

## 2019-11-27 ENCOUNTER — Ambulatory Visit: Payer: Self-pay | Admitting: *Deleted
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
## 2019-11-27 NOTE — Telephone Encounter (Signed)
Looks like he is in [place] ED. Will follow along.

## 2019-11-27 NOTE — Telephone Encounter (Signed)
FYI

## 2019-11-29 ENCOUNTER — Telehealth: Payer: Self-pay

## 2019-11-29 DIAGNOSIS — F410 Panic disorder [episodic paroxysmal anxiety] without agoraphobia: Secondary | ICD-10-CM

## 2019-11-29 DIAGNOSIS — F3160 Bipolar disorder, current episode mixed, unspecified: Secondary | ICD-10-CM

## 2019-11-29 DIAGNOSIS — F4310 Post-traumatic stress disorder, unspecified: Secondary | ICD-10-CM

## 2019-11-29 NOTE — Telephone Encounter (Signed)
Received a fax requesting a refill on gabapentin   gabapentin (NEURONTIN) 300 MG capsule Medication Date: 09/22/2019 Department: [facility] Ordering/Authorizing: Ursula Alert, MD  Order Providers  Prescribing Provider Encounter Provider  Ursula Alert, MD Ursula Alert, MD  Outpatient Medication Detail   Disp Refills Start End   gabapentin (NEURONTIN) 300 MG capsule 180 capsule 0 09/22/2019    Sig - Route: Take 1 capsule (300 mg total) by mouth 2 (two) times daily. - Oral   Sent to pharmacy as: gabapentin (NEURONTIN) 300 MG capsule   E-Prescribing Status: Receipt confirmed by pharmacy (09/22/2019 11:20 AM EDT)

## 2019-11-30 MED ORDER — GABAPENTIN 300 MG PO CAPS: 300.0000 mg | ORAL_CAPSULE | Freq: Two times a day (BID) | ORAL | 0 refills | Status: DC

## 2019-11-30 NOTE — Telephone Encounter (Signed)
I have sent gabapentin to pharmacy.

## 2019-12-05 ENCOUNTER — Encounter: Payer: Self-pay | Admitting: Physician Assistant

## 2019-12-05 ENCOUNTER — Ambulatory Visit (INDEPENDENT_AMBULATORY_CARE_PROVIDER_SITE_OTHER): Payer: 59 | Admitting: Physician Assistant

## 2019-12-05 ENCOUNTER — Other Ambulatory Visit: Payer: Self-pay

## 2019-12-05 VITALS — BP 153/114 | HR 132 | Temp 96.2°F | Wt 245.6 lb

## 2019-12-05 DIAGNOSIS — R591 Generalized enlarged lymph nodes: Secondary | ICD-10-CM | POA: Diagnosis not present

## 2019-12-05 DIAGNOSIS — R079 Chest pain, unspecified: Secondary | ICD-10-CM | POA: Diagnosis not present

## 2019-12-05 MED ORDER — MELOXICAM 15 MG PO TABS: 15.0000 mg | ORAL_TABLET | Freq: Every day | ORAL | 0 refills | Status: DC

## 2019-12-05 MED ORDER — KETOROLAC TROMETHAMINE 30 MG/ML IJ SOLN: 30.0000 mg | Freq: Once | INTRAMUSCULAR | Status: DC

## 2019-12-05 MED ORDER — KETOROLAC TROMETHAMINE 60 MG/2ML IM SOLN
60.0000 mg | Freq: Once | INTRAMUSCULAR | Status: AC
  Administered 2019-12-05: 60 mg via INTRAMUSCULAR
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
## 2019-12-07 ENCOUNTER — Encounter: Payer: Self-pay | Admitting: Licensed Clinical Social Worker

## 2019-12-07 ENCOUNTER — Other Ambulatory Visit: Payer: Self-pay

## 2019-12-07 ENCOUNTER — Ambulatory Visit (INDEPENDENT_AMBULATORY_CARE_PROVIDER_SITE_OTHER): Payer: 59 | Admitting: Licensed Clinical Social Worker

## 2019-12-07 DIAGNOSIS — F3160 Bipolar disorder, current episode mixed, unspecified: Secondary | ICD-10-CM

## 2019-12-07 NOTE — Progress Notes (Signed)
Virtual Visit via Telephone Note  I connected with Russell Simpson on 12/07/19 at  2:30 PM EST by telephone and verified that I am speaking with the correct person using two identifiers.   I discussed the limitations, risks, security and privacy concerns of performing an evaluation and management service by telephone and the availability of in person appointments. I also discussed with the patient that there may be a patient responsible charge related to this service. The patient expressed understanding and agreed to proceed.   I discussed the assessment and treatment plan with the patient. The patient was provided an opportunity to ask questions and all were answered. The patient agreed with the plan and demonstrated an understanding of the instructions.   The patient was advised to call back or seek an in-person evaluation if the symptoms worsen or if the condition fails to improve as anticipated.  I provided 15 minutes of non-face-to-face time during this encounter.   Alden Hipp, LCSW    THERAPIST PROGRESS NOTE  Session Time: 1430  Participation Level: Active  Behavioral Response: NeatAlertNA  Type of Therapy: Individual Therapy  Treatment Goals addressed: Coping  Interventions: Supportive  Summary: Russell Simpson is a 59 y.o. male who presents with continued symptoms related to his diagnosis. Russell Simpson was last seen in September 2020. LCSW asked Russell Simpson what prompted him to return to therapy at this time. He reported the MD in the clinic told him he needed to return. LCSW asked Russell Simpson what symptoms or situations he wanted to address through therapy. He reported he wasn't sure what the doctor wanted him to work on, "I really can't remember." LCSW asked if he had things he wanted to address through therapy. Russell Simpson reported he "couldn't think of anything. Everything has been great since I got my disability." LCSW validated Robbie's feelings and reviewed coping skills Russell Simpson could  utilize should negative feelings or symptoms return. LCSW also shared these things via email so Russell Simpson could refer to them later. We decided to move to as needed therapy as Russell Simpson was unable to identify things he would like to work on during therapy sessions at this time.    Suicidal/Homicidal: No  Therapist Response: Russell Simpson continues to work towards his tx goals, but does not feel he needs therapy at this time.   Plan: Return again as needed.   Diagnosis: Axis I: Post Traumatic Stress Disorder    Axis II: No diagnosis    Alden Hipp, LCSW 12/07/2019

## 2019-12-08 ENCOUNTER — Other Ambulatory Visit: Payer: Self-pay

## 2019-12-08 ENCOUNTER — Inpatient Hospital Stay: Payer: 59 | Attending: Oncology | Admitting: Oncology

## 2019-12-08 ENCOUNTER — Inpatient Hospital Stay: Payer: 59 | Admitting: Oncology

## 2019-12-08 ENCOUNTER — Encounter: Payer: Self-pay | Admitting: Oncology

## 2019-12-08 ENCOUNTER — Encounter: Payer: Self-pay | Admitting: *Deleted

## 2019-12-08 ENCOUNTER — Inpatient Hospital Stay: Payer: 59

## 2019-12-08 VITALS — BP 139/98 | HR 104 | Temp 97.7°F | Ht 74.0 in | Wt 251.0 lb

## 2019-12-08 DIAGNOSIS — D352 Benign neoplasm of pituitary gland: Secondary | ICD-10-CM | POA: Insufficient documentation

## 2019-12-08 DIAGNOSIS — F17210 Nicotine dependence, cigarettes, uncomplicated: Secondary | ICD-10-CM | POA: Insufficient documentation

## 2019-12-08 DIAGNOSIS — M8949 Other hypertrophic osteoarthropathy, multiple sites: Secondary | ICD-10-CM | POA: Insufficient documentation

## 2019-12-08 DIAGNOSIS — E230 Hypopituitarism: Secondary | ICD-10-CM | POA: Insufficient documentation

## 2019-12-08 DIAGNOSIS — R339 Retention of urine, unspecified: Secondary | ICD-10-CM | POA: Insufficient documentation

## 2019-12-08 DIAGNOSIS — E785 Hyperlipidemia, unspecified: Secondary | ICD-10-CM | POA: Diagnosis not present

## 2019-12-08 DIAGNOSIS — R590 Localized enlarged lymph nodes: Secondary | ICD-10-CM

## 2019-12-08 DIAGNOSIS — F1021 Alcohol dependence, in remission: Secondary | ICD-10-CM | POA: Insufficient documentation

## 2019-12-08 DIAGNOSIS — Z79899 Other long term (current) drug therapy: Secondary | ICD-10-CM | POA: Insufficient documentation

## 2019-12-08 DIAGNOSIS — N521 Erectile dysfunction due to diseases classified elsewhere: Secondary | ICD-10-CM | POA: Diagnosis not present

## 2019-12-08 DIAGNOSIS — Z9181 History of falling: Secondary | ICD-10-CM | POA: Insufficient documentation

## 2019-12-08 DIAGNOSIS — I771 Stricture of artery: Secondary | ICD-10-CM | POA: Insufficient documentation

## 2019-12-08 DIAGNOSIS — Z791 Long term (current) use of non-steroidal anti-inflammatories (NSAID): Secondary | ICD-10-CM | POA: Insufficient documentation

## 2019-12-08 DIAGNOSIS — J309 Allergic rhinitis, unspecified: Secondary | ICD-10-CM | POA: Diagnosis not present

## 2019-12-08 DIAGNOSIS — F4310 Post-traumatic stress disorder, unspecified: Secondary | ICD-10-CM | POA: Diagnosis not present

## 2019-12-08 DIAGNOSIS — I10 Essential (primary) hypertension: Secondary | ICD-10-CM | POA: Insufficient documentation

## 2019-12-08 DIAGNOSIS — F410 Panic disorder [episodic paroxysmal anxiety] without agoraphobia: Secondary | ICD-10-CM | POA: Insufficient documentation

## 2019-12-08 DIAGNOSIS — F319 Bipolar disorder, unspecified: Secondary | ICD-10-CM | POA: Diagnosis not present

## 2019-12-08 DIAGNOSIS — R0789 Other chest pain: Secondary | ICD-10-CM | POA: Diagnosis not present

## 2019-12-08 LAB — COMPREHENSIVE METABOLIC PANEL
ALT: 35 U/L (ref 0–44)
AST: 29 U/L (ref 15–41)
Albumin: 4.3 g/dL (ref 3.5–5.0)
Alkaline Phosphatase: 105 U/L (ref 38–126)
Anion gap: 13 (ref 5–15)
BUN: 15 mg/dL (ref 6–20)
CO2: 19 mmol/L — ABNORMAL LOW (ref 22–32)
Calcium: 9 mg/dL (ref 8.9–10.3)
Chloride: 101 mmol/L (ref 98–111)
Creatinine, Ser: 1.07 mg/dL (ref 0.61–1.24)
GFR calc Af Amer: >60 mL/min (ref >60)
GFR calc non Af Amer: >60 mL/min (ref >60)
Glucose, Bld: 135 mg/dL — ABNORMAL HIGH (ref 70–99)
Potassium: 4 mmol/L (ref 3.5–5.1)
Sodium: 133 mmol/L — ABNORMAL LOW (ref 135–145)
Total Bilirubin: 0.5 mg/dL (ref 0.3–1.2)
Total Protein: 7.6 g/dL (ref 6.5–8.1)

## 2019-12-08 LAB — CBC WITH DIFFERENTIAL/PLATELET
Abs Immature Granulocytes: 0.02 10*3/uL (ref 0.00–0.07)
Basophils Absolute: 0.1 10*3/uL (ref 0.0–0.1)
Basophils Relative: 1 %
Eosinophils Absolute: 0.3 10*3/uL (ref 0.0–0.5)
Eosinophils Relative: 3 %
HCT: 50.2 % (ref 39.0–52.0)
Hemoglobin: 17 g/dL (ref 13.0–17.0)
Immature Granulocytes: 0 %
Lymphocytes Relative: 60 %
Lymphs Abs: 5.3 10*3/uL — ABNORMAL HIGH (ref 0.7–4.0)
MCH: 30.7 pg (ref 26.0–34.0)
MCHC: 33.9 g/dL (ref 30.0–36.0)
MCV: 90.8 fL (ref 80.0–100.0)
Monocytes Absolute: 0.7 10*3/uL (ref 0.1–1.0)
Monocytes Relative: 9 %
Neutro Abs: 2.4 10*3/uL (ref 1.7–7.7)
Neutrophils Relative %: 27 %
Platelets: 213 10*3/uL (ref 150–400)
RBC: 5.53 MIL/uL (ref 4.22–5.81)
RDW: 12.7 % (ref 11.5–15.5)
WBC: 8.7 10*3/uL (ref 4.0–10.5)
nRBC: 0 % (ref 0.0–0.2)

## 2019-12-08 LAB — PROTIME-INR
INR: 0.9 (ref 0.8–1.2)
Prothrombin Time: 11.9 seconds (ref 11.4–15.2)

## 2019-12-08 LAB — LACTATE DEHYDROGENASE: LDH: 129 U/L (ref 98–192)
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
## 2019-12-11 ENCOUNTER — Encounter: Payer: Self-pay | Admitting: Oncology

## 2019-12-11 NOTE — Progress Notes (Signed)
Hematology/Oncology Consult note [facility] [phone] [phone]  Patient Care Team: Paulene Floor as PCP - General (Physician Assistant) Vern Claude, [place] as Social Worker   Name of the patient: Russell Simpson  [id number]  1961/05/28    Reason for referral- axillary adenopathy   Referring physician- Carles Collet PA  Date of visit: 12/11/19   History of presenting illness-patient is a 59 year old male with a past medical history significant for hypertension, hyperlipidemia, hypogonadism, pituitary adenoma who recently had a fall on in December 2020. [name]  He sustained a left anterior frontal sinus as well as supraorbital rim fracture on 11/21/2019 which was managed conservatively.  He then presented to the ER at [facility] with symptoms of right-sided chest wall pain this led to a CT PE which did not show any evidence of pulmonary embolism.  He was noted to have bilateral symmetric axillary adenopathy measuring up to 1.8 cm.  Scattered mediastinal adenopathy.  Right paratracheal node measuring up to 1.2 cm.  Prominent right costophrenic lymph node measuring up to 1 cm.  Findings are nonspecific but could be seen with lymphoma versus systemic inflammatory disease such as sarcoidosis.  Of note patient has had a prior CT scan for lung screening back in 2015 when he was not noted to have this adenopathy.  He has been referred to [place] for further management  Patient currently complains of pain along his right chest wall which is getting worse reports that it is not adequately controlled with his pain medications.  Patient reports a good appetite and denies any unintentional weight loss  ECOG PS- 1  Pain scale- 5   Review of systems- Review of Systems  Constitutional: Negative for chills, fever, malaise/fatigue and weight loss.  HENT: Negative for congestion, ear discharge and nosebleeds.   Eyes: Negative for blurred vision.  Respiratory:  Negative for cough, hemoptysis, sputum production, shortness of breath and wheezing.        Right chest wall pain  Cardiovascular: Negative for chest pain, palpitations, orthopnea and claudication.  Gastrointestinal: Negative for abdominal pain, blood in stool, constipation, diarrhea, heartburn, melena, nausea and vomiting.  Genitourinary: Negative for dysuria, flank pain, frequency, hematuria and urgency.  Musculoskeletal: Negative for back pain, joint pain and myalgias.  Skin: Negative for rash.  Neurological: Negative for dizziness, tingling, focal weakness, seizures, weakness and headaches.  Endo/Heme/Allergies: Does not bruise/bleed easily.  Psychiatric/Behavioral: Negative for depression and suicidal ideas. The patient does not have insomnia.     Allergies  Allergen Reactions  . Penicillins Anaphylaxis  . Tetanus Toxoids Swelling  . Lisinopril     Other reaction(s): Other (See Comments) Other reaction(s): Cough Other reaction(s): Cough Other reaction(s): Cough Other reaction(s): Cough  . Losartan     Other reaction(s): Other (See Comments) Other reaction(s): Muscle Pain Other reaction(s): Muscle Pain Other reaction(s): Muscle Pain  . Tetanus Toxoid   . Codeine Nausea Only  . Doxycycline Rash    Patient Active Problem List   Diagnosis Date Noted  . Erectile dysfunction due to arterial insufficiency 08/10/2019  . Bipolar 1 disorder, mixed, mild ([place]) 07/19/2019  . PTSD (post-traumatic stress disorder) 07/19/2019  . Panic disorder 07/19/2019  . Tobacco use disorder 07/19/2019  . Moderate episode of recurrent major depressive disorder ([place]) 05/25/2019  . S/P lumbar fusion 02/22/2019  . Alcohol use disorder, severe, in early remission ([place]) 01/01/2019  . Disorder of bursae of shoulder region 07/21/2017  . Full thickness rotator cuff tear 07/21/2017  .  Localized, primary osteoarthritis 07/21/2017  . 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 Osteoarthritis of knee 07/21/2017   . Anxiety 01/10/2016  . BP (high blood pressure) 01/10/2016  . Apnea, sleep 01/10/2016  . Hyperlipidemia 10/15/2015  . Pituitary adenoma ([place]) 10/15/2015  . Hypogonadism in male 10/15/2015  . Depression 10/15/2015  . 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 Allergic rhinitis 10/15/2015  . Incomplete bladder emptying 05/30/2015  . Hernia, inguinal, left 09/21/2014  . Benign prostatic hyperplasia with lower urinary tract symptoms 10/24/2012  . 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 OSTEOMYELITIS, ACUTE, OTHER [facility] SITE 06/09/2006     Past Medical History:  Diagnosis Date  . 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[place])   . Arthritis   . Asthma   . Brain tumor (benign) ([place])    benign pituitary neoplasm  . Chronic pain    right arm  . Depression   . Environmental and seasonal allergies   . Hypertension   . Pneumonia   . Sleep apnea    does not wear CPAP ; uses humidifier instead     Past Surgical History:  Procedure Laterality Date  . ANTERIOR CERVICAL DECOMP/DISCECTOMY FUSION N/A 06/17/2016   Procedure: ANTERIOR CERVICAL DECOMPRESSION FUSION, CERVICAL 3-4, CERVICAL 4-5 WITH INSTRUMENTATION AND ALLOGRAFT;  Surgeon: Phylliss Bob, MD;  Location: [place];  Service: Orthopedics;  Laterality: N/A;  ANTERIOR CERVICAL DECOMPRESSION FUSION, CERVICAL 3-4, CERVICAL 4-5 WITH INSTRUMENTATION AND ALLOGRAFT  . BACK SURGERY    . NECK SURGERY  2009    Social History   Socioeconomic History  . Marital status: Divorced    Spouse name: Not on file  . Number of children: Not on file  . Years of education: Not on file  . Highest education level: Not on file  Occupational History  . Not on file  Tobacco Use  . Smoking status: Light Tobacco Smoker    Years: 20.00    Types: E-cigarettes, Cigars  . Smokeless tobacco: Never Used  . Tobacco comment: " sometimes during the day and at night"  Substance and Sexual Activity  . Alcohol use: Not Currently    Alcohol/week: 0.0 standard drinks     Comment: 12 pack a beer a night  . Drug use: No  . Sexual activity: Not Currently  Other Topics Concern  . Not on file  Social History Narrative  . Not on file   Social Determinants of Health   Financial Resource Strain:   . Difficulty of Paying Living Expenses: Not on file  Food Insecurity:   . Worried About Charity fundraiser in the Last Year: Not on file  . Ran Out of Food in the Last Year: Not on file  Transportation Needs:   . Lack of Transportation (Medical): Not on file  . Lack of Transportation (Non-Medical): Not on file  Physical Activity: Insufficiently Active  . Days of Exercise per Week: 1 day  . Minutes of Exercise per Session: 10 min  Stress: Stress Concern Present  . Feeling of Stress : Rather much  Social Connections: Moderately Isolated  . Frequency of Communication with Friends and Family: Twice a week  . Frequency of Social Gatherings with Friends and Family: Once a week  . Attends Religious Services: Never  . Active Member of Clubs or Organizations: No  . Attends Archivist Meetings: Never  . Marital Status: Divorced  Human resources officer Violence:   . Fear of Current or Ex-Partner: Not on file  . Emotionally Abused: Not on file  .  Physically Abused: Not on file  . Sexually Abused: Not on file     Family History  Problem Relation Age of Onset  . Diabetes Mother   . Diabetes Other      Current Outpatient Medications:  .  albuterol (VENTOLIN HFA) 108 (90 Base) MCG/ACT inhaler, INHALE 2 PUFFS BY MOUTH EVERY 6 HOURS ASNEEDED FOR WHEEZING, Disp: 8.5 g, Rfl: 4 .  amLODipine (NORVASC) 5 MG tablet, Take 1 tablet (5 mg total) by mouth daily., Disp: 90 tablet, Rfl: 3 .  Arginine 2000 MG PACK, Take by mouth daily., Disp: , Rfl:  .  atorvastatin (LIPITOR) 10 MG tablet, Take 1 tablet (10 mg total) by mouth at bedtime., Disp: 90 tablet, Rfl: 0 .  benzonatate (TESSALON) 200 MG capsule, Take 1 capsule (200 mg total) by mouth 3 (three) times daily as  needed., Disp: 30 capsule, Rfl: 0 .  benztropine (COGENTIN) 0.5 MG tablet, Take 1 tablet (0.5 mg total) by mouth daily as needed. For muscle cramps and side effects of olanzapine, Disp: 90 tablet, Rfl: 0 .  Carbonyl Iron (PERFECT IRON) 25 MG TABS, Take by mouth daily., Disp: , Rfl:  .  Coenzyme Q10 (COQ-10) 100 MG CAPS, Take by mouth daily., Disp: , Rfl:  .  colchicine 0.6 MG tablet, Take 2 tablets (1.2mg ) by mouth at first sign of gout flare followed by 1 tablet (0.6mg ) after 1 hour. (Max 1.8mg  within 1 hour), Disp: , Rfl:  .  Cyanocobalamin (B-12) 5000 MCG SUBL, Place under the tongue daily., Disp: , Rfl:  .  DHEA 50 MG CAPS, Take by mouth., Disp: , Rfl:  .  DULoxetine (CYMBALTA) 30 MG capsule, TAKE 2 CAPSULES BY MOUTH ONCE DAILY, Disp: 60 capsule, Rfl: 2 .  EUCRISA 2 % OINT, Apply  a small amount to affected area   qd/bid to aa's chest, inframammary, eye, Disp: , Rfl:  .  famotidine (PEPCID) 20 MG tablet, Take 1 tablet (20 mg total) by mouth 2 (two) times daily., Disp: 180 tablet, Rfl: 0 .  finasteride (PROSCAR) 5 MG tablet, Take 1 tablet (5 mg total) by mouth daily., Disp: 90 tablet, Rfl: 3 .  fluticasone (FLONASE) 50 MCG/ACT nasal spray, Place into the nose., Disp: , Rfl:  .  gabapentin (NEURONTIN) 300 MG capsule, Take 1 capsule (300 mg total) by mouth 2 (two) times daily., Disp: 180 capsule, Rfl: 0 .  Ginger, Zingiber officinalis, (GINGER PO), Take by mouth., Disp: , Rfl:  .  Glucosamine 500 MG CAPS, Take by mouth daily., Disp: , Rfl:  .  hydrOXYzine (VISTARIL) 25 MG capsule, , Disp: , Rfl:  .  indomethacin (INDOCIN) 50 MG capsule, TAKE 1 CAPSULE BY MOUTH 3 TIMES PER DAY WITH MEALS, Disp: 90 capsule, Rfl: 5 .  loratadine (CLARITIN) 10 MG tablet, Take 10 mg by mouth daily. , Disp: , Rfl:  .  magnesium oxide (MAG-OX) 400 MG tablet, Take 400 mg by mouth daily., Disp: , Rfl:  .  meloxicam (MOBIC) 15 MG tablet, Take 1 tablet (15 mg total) by mouth daily., Disp: 30 tablet, Rfl: 0 .  methocarbamol  (ROBAXIN) 500 MG tablet, Take 1 tablet (500 mg total) by mouth every 8 (eight) hours as needed for muscle spasms., Disp: 30 tablet, Rfl: 0 .  metoprolol succinate (TOPROL-XL) 25 MG 24 hr tablet, TAKE 1 TABLET BY MOUTH ONCE DAILY, Disp: 30 tablet, Rfl: 3 .  montelukast (SINGULAIR) 10 MG tablet, Take 1 tablet (10 mg total) by mouth at bedtime., Disp:  30 tablet, Rfl: 5 .  naproxen sodium (ANAPROX) 220 MG tablet, Take 220 mg by mouth 2 (two) times daily with a meal., Disp: , Rfl:  .  OLANZapine (ZYPREXA) 10 MG tablet, Take 1 tablet (10 mg total) by mouth at bedtime., Disp: 90 tablet, Rfl: 0 .  Omega-3 Fatty Acids (FISH OIL) 1000 MG CAPS, Take by mouth., Disp: , Rfl:  .  omeprazole (PRILOSEC) 20 MG capsule, TAKE 1 CAPSULE BY MOUTH TWICE DAILY BEFORE MEALS, Disp: 60 capsule, Rfl: 2 .  OVER THE COUNTER MEDICATION, OREGA MAX, Disp: , Rfl:  .  OVER THE COUNTER MEDICATION, RED MARINE ALGAE-375 MG., Disp: , Rfl:  .  sildenafil (REVATIO) 20 MG tablet, 3-5 tablets daily as needed for erectile dysfunction, Disp: 30 tablet, Rfl: 3 .  tacrolimus (PROTOPIC) 0.1 % ointment, Apply 1 application topically 2 (two) times daily., Disp: , Rfl:  .  tamsulosin (FLOMAX) 0.4 MG CAPS capsule, Take 1 capsule (0.4 mg total) by mouth daily., Disp: 90 capsule, Rfl: 3 .  testosterone cypionate (DEPOTESTOSTERONE CYPIONATE) 200 MG/ML injection, Inject 0.5 mLs (100 mg total) into the muscle once a week., Disp: 10 mL, Rfl: 0 .  triamcinolone cream (KENALOG) 0.1 %, Apply 1 application topically 2 (two) times daily., Disp: 30 g, Rfl: 0 .  cyclobenzaprine (FLEXERIL) 10 MG tablet, Take 1 tablet (10 mg total) by mouth 3 (three) times daily as needed for muscle spasms. (Patient not taking: Reported on 12/08/2019), Disp: 30 tablet, Rfl: 0 .  predniSONE (DELTASONE) 10 MG tablet, Take tablets by mouth on a taper starting 6 day 1, 5 day 2, 4 day 3, 3 day 4, 2 day 5 and 1 day 6 (spread out doses between meals). (Patient not taking: Reported on  12/08/2019), Disp: 21 tablet, Rfl: 0   Physical exam:  Vitals:   12/08/19 1124 12/08/19 1126  BP:  (!) 139/98  Pulse:  (!) 104  Temp:  97.7 F (36.5 C)  TempSrc:  Tympanic  SpO2: 90% 90%  Weight:  251 lb (113.9 kg)  Height:  6\' 2"  (1.88 m)   Physical Exam HENT:     Head: Normocephalic and atraumatic.  Eyes:     Pupils: Pupils are equal, round, and reactive to light.  Cardiovascular:     Rate and Rhythm: Normal rate and regular rhythm.     Heart sounds: Normal heart sounds.  Pulmonary:     Effort: Pulmonary effort is normal.     Breath sounds: Normal breath sounds.  Abdominal:     General: Bowel sounds are normal.     Palpations: Abdomen is soft.  Musculoskeletal:     Cervical back: Normal range of motion.  Lymphadenopathy:     Comments: No palpable cervical, supraclavicular, axillary or inguinal adenopathy   Skin:    General: Skin is warm and dry.  Neurological:     Mental Status: He is alert and oriented to person, place, and time.        CMP Latest Ref Rng & Units 12/08/2019  Glucose 70 - 99 mg/dL 135(H)  BUN 6 - 20 mg/dL 15  Creatinine 0.61 - 1.24 mg/dL 1.07  Sodium 135 - 145 mmol/L 133(L)  Potassium 3.5 - 5.1 mmol/L 4.0  Chloride 98 - 111 mmol/L 101  CO2 22 - 32 mmol/L 19(L)  Calcium 8.9 - 10.3 mg/dL 9.0  Total Protein 6.5 - 8.1 g/dL 7.6  Total Bilirubin 0.3 - 1.2 mg/dL 0.5  Alkaline Phos 38 - 126 U/L 105  AST 15 - 41 U/L 29  ALT 0 - 44 U/L 35   CBC Latest Ref Rng & Units 12/08/2019  WBC 4.0 - 10.5 K/uL 8.7  Hemoglobin 13.0 - 17.0 g/dL 17.0  Hematocrit 39.0 - 52.0 % 50.2  Platelets 150 - 400 K/uL 213    No images are attached to the encounter.  CT Head Wo Contrast  Result Date: 11/21/2019 CLINICAL DATA:  Slipped, hitting head, loss of consciousness EXAM: CT head, face, CERVICAL SPINE WITHOUT CONTRAST TECHNIQUE: Multidetector CT imaging of the cervical spine was performed without intravenous contrast. Multiplanar CT image reconstructions were also  generated. COMPARISON:  None. FINDINGS: Brain: No evidence of acute territorial infarction, hemorrhage, hydrocephalus,extra-axial collection or mass lesion/mass effect. Normal gray-white differentiation. Ventricles are normal in size and contour. Vascular: No hyperdense vessel or unexpected calcification. Skull: There is a comminuted depressed fracture seen through the left frontal skull involving the frontal sinus with approximately 8 mm of cortical depression. There is a small amount of fluid seen within the frontal sinus and ethmoid air cells. Overlying subcutaneous emphysema and soft tissue swelling is noted. Sinuses/Orbits: The mastoid air cells are clear. The orbits and globes intact. Other: None Face: Osseous: There is comminuted depressed fracture of the left frontal skull in the frontal sinus with approximately 8 mm of cortical depression. There is air and fluid seen within the left frontal sinus and ethmoid air cells. Overlying subcutaneous emphysema and soft tissue swelling is noted. There is a healed fracture deformity of the anterior nasal bone. No other fractures are seen. Orbits: No fracture identified. Unremarkable appearance of globes and orbits. Sinuses: As described above. The mastoid air cells are unremarkable. Soft tissues:  No acute findings. Limited intracranial: No acute findings. Cervical spine: Alignment: There is straightening of the normal cervical lordosis. Skull base and vertebrae: Visualized skull base is intact. No atlanto-occipital dissociation. The patient is status post ACDF at C3 through C5. No periprosthetic lucency or fracture is seen. Posterior osseous fusion seen within the interbody spacers at this level. There is also prior interbody fusion seen at C5-C6. Soft tissues and spinal canal: The visualized paraspinal soft tissues are unremarkable. No prevertebral soft tissue swelling is seen. The spinal canal is grossly unremarkable, no large epidural collection or significant  canal narrowing. Disc levels: Anterior osteophytes are most notable at C6-C7. There is a disc osteophyte complex and uncovertebral osteophytes which causes moderate to severe bilateral neural foraminal narrowing at this level. Upper chest: The lung apices are clear. Thoracic inlet is within normal limits. Other: None IMPRESSION: 1.  No acute intracranial abnormality. 2. Comminuted depressed fracture of the left frontal skull involving the frontal sinus with 8 mm of cortical depression. Overlying soft tissue swelling with subcutaneous emphysema. 3. Blood and fluid within the left frontal sinus and ethmoid air cells. 4.  No acute fracture or malalignment of the spine. 5. Status post ACDF from C3 through C5 without hardware complication. Electronically Signed   By: Prudencio Pair M.D.   On: 11/21/2019 02:44   CT Cervical Spine Wo Contrast  Result Date: 11/21/2019 CLINICAL DATA:  Slipped, hitting head, loss of consciousness EXAM: CT head, face, CERVICAL SPINE WITHOUT CONTRAST TECHNIQUE: Multidetector CT imaging of the cervical spine was performed without intravenous contrast. Multiplanar CT image reconstructions were also generated. COMPARISON:  None. FINDINGS: Brain: No evidence of acute territorial infarction, hemorrhage, hydrocephalus,extra-axial collection or mass lesion/mass effect. Normal gray-white differentiation. Ventricles are normal in size and contour. Vascular: No hyperdense vessel or  unexpected calcification. Skull: There is a comminuted depressed fracture seen through the left frontal skull involving the frontal sinus with approximately 8 mm of cortical depression. There is a small amount of fluid seen within the frontal sinus and ethmoid air cells. Overlying subcutaneous emphysema and soft tissue swelling is noted. Sinuses/Orbits: The mastoid air cells are clear. The orbits and globes intact. Other: None Face: Osseous: There is comminuted depressed fracture of the left frontal skull in the frontal  sinus with approximately 8 mm of cortical depression. There is air and fluid seen within the left frontal sinus and ethmoid air cells. Overlying subcutaneous emphysema and soft tissue swelling is noted. There is a healed fracture deformity of the anterior nasal bone. No other fractures are seen. Orbits: No fracture identified. Unremarkable appearance of globes and orbits. Sinuses: As described above. The mastoid air cells are unremarkable. Soft tissues:  No acute findings. Limited intracranial: No acute findings. Cervical spine: Alignment: There is straightening of the normal cervical lordosis. Skull base and vertebrae: Visualized skull base is intact. No atlanto-occipital dissociation. The patient is status post ACDF at C3 through C5. No periprosthetic lucency or fracture is seen. Posterior osseous fusion seen within the interbody spacers at this level. There is also prior interbody fusion seen at C5-C6. Soft tissues and spinal canal: The visualized paraspinal soft tissues are unremarkable. No prevertebral soft tissue swelling is seen. The spinal canal is grossly unremarkable, no large epidural collection or significant canal narrowing. Disc levels: Anterior osteophytes are most notable at C6-C7. There is a disc osteophyte complex and uncovertebral osteophytes which causes moderate to severe bilateral neural foraminal narrowing at this level. Upper chest: The lung apices are clear. Thoracic inlet is within normal limits. Other: None IMPRESSION: 1.  No acute intracranial abnormality. 2. Comminuted depressed fracture of the left frontal skull involving the frontal sinus with 8 mm of cortical depression. Overlying soft tissue swelling with subcutaneous emphysema. 3. Blood and fluid within the left frontal sinus and ethmoid air cells. 4.  No acute fracture or malalignment of the spine. 5. Status post ACDF from C3 through C5 without hardware complication. Electronically Signed   By: Prudencio Pair M.D.   On: 11/21/2019  02:44   CT Maxillofacial Wo Contrast  Result Date: 11/21/2019 CLINICAL DATA:  Slipped, hitting head, loss of consciousness EXAM: CT head, face, CERVICAL SPINE WITHOUT CONTRAST TECHNIQUE: Multidetector CT imaging of the cervical spine was performed without intravenous contrast. Multiplanar CT image reconstructions were also generated. COMPARISON:  None. FINDINGS: Brain: No evidence of acute territorial infarction, hemorrhage, hydrocephalus,extra-axial collection or mass lesion/mass effect. Normal gray-white differentiation. Ventricles are normal in size and contour. Vascular: No hyperdense vessel or unexpected calcification. Skull: There is a comminuted depressed fracture seen through the left frontal skull involving the frontal sinus with approximately 8 mm of cortical depression. There is a small amount of fluid seen within the frontal sinus and ethmoid air cells. Overlying subcutaneous emphysema and soft tissue swelling is noted. Sinuses/Orbits: The mastoid air cells are clear. The orbits and globes intact. Other: None Face: Osseous: There is comminuted depressed fracture of the left frontal skull in the frontal sinus with approximately 8 mm of cortical depression. There is air and fluid seen within the left frontal sinus and ethmoid air cells. Overlying subcutaneous emphysema and soft tissue swelling is noted. There is a healed fracture deformity of the anterior nasal bone. No other fractures are seen. Orbits: No fracture identified. Unremarkable appearance of globes and orbits. Sinuses: As  described above. The mastoid air cells are unremarkable. Soft tissues:  No acute findings. Limited intracranial: No acute findings. Cervical spine: Alignment: There is straightening of the normal cervical lordosis. Skull base and vertebrae: Visualized skull base is intact. No atlanto-occipital dissociation. The patient is status post ACDF at C3 through C5. No periprosthetic lucency or fracture is seen. Posterior osseous  fusion seen within the interbody spacers at this level. There is also prior interbody fusion seen at C5-C6. Soft tissues and spinal canal: The visualized paraspinal soft tissues are unremarkable. No prevertebral soft tissue swelling is seen. The spinal canal is grossly unremarkable, no large epidural collection or significant canal narrowing. Disc levels: Anterior osteophytes are most notable at C6-C7. There is a disc osteophyte complex and uncovertebral osteophytes which causes moderate to severe bilateral neural foraminal narrowing at this level. Upper chest: The lung apices are clear. Thoracic inlet is within normal limits. Other: None IMPRESSION: 1.  No acute intracranial abnormality. 2. Comminuted depressed fracture of the left frontal skull involving the frontal sinus with 8 mm of cortical depression. Overlying soft tissue swelling with subcutaneous emphysema. 3. Blood and fluid within the left frontal sinus and ethmoid air cells. 4.  No acute fracture or malalignment of the spine. 5. Status post ACDF from C3 through C5 without hardware complication. Electronically Signed   By: Prudencio Pair M.D.   On: 11/21/2019 02:44    Assessment and plan- Patient is a 59 y.o. male referred for bilateral axillary and mediastinal adenopathy  Patient did have a CT chest back in 2015 as a part of the lung cancer screening program which did not show any evidence of axillary or mediastinal adenopathy.  His most recent CT scan done after the fall nodes bilateral symmetric axillary adenopathy up to 1.8 cm in size as well as small mediastinal adenopathy up to 1.2 cm.  It is unclear if these findings represent a reactive process versus malignancy.  At this time I would like to obtain images of the CT chest done at [facility] for our radiologist to take a look at it to see if axillary lymph node biopsy is a possibility.  If biopsy is feasible that would be the next step.  If biopsy is not feasible I will plan to repeat his CT chest in 3  months time.  Suspect right-sided chest wall pain is musculoskeletal as it started after the fall and might take few more weeks to resolve.  I will tentatively see him back in 2 weeks for a video visit after the biopsy is done to discuss further management.  I will obtain baseline labs including CBC with differential, CMP and LDH today   Thank you for this kind referral and the opportunity to participate in the care of this patient   Visit Diagnosis 1. Axillary adenopathy     Dr. Randa Evens, MD, MPH [facility] at [facility] [id number] 12/11/2019  2:44 PM

## 2019-12-13 ENCOUNTER — Other Ambulatory Visit: Payer: Self-pay | Admitting: *Deleted

## 2019-12-13 ENCOUNTER — Ambulatory Visit
Admission: RE | Admit: 2019-12-13 | Discharge: 2019-12-13 | Disposition: A | Discharge status: Home / Self Care | Payer: Self-pay | Source: Ambulatory Visit | Attending: Oncology | Admitting: Oncology

## 2019-12-13 DIAGNOSIS — R590 Localized enlarged lymph nodes: Secondary | ICD-10-CM

## 2019-12-13 NOTE — Progress Notes (Signed)
outside

## 2019-12-15 ENCOUNTER — Telehealth: Payer: Self-pay | Admitting: *Deleted

## 2019-12-15 NOTE — Telephone Encounter (Signed)
Called cell phone of pt and got voicemail and left message that pt's scan was discussed in tumor conference. It is a way for lots of MD's to eval the scan and see what is the best plan for each pt. His case was discussed and the lymph nodes are small so if they did biopsy it could be hit or miss to get a good sample due to how small it is . The other thought was rescan in 52months to see what it looks like then. Dr. Janese Banks is rec: scan in 3 months. She is open to what decision the pt would like. Gave direct phone number to call back

## 2019-12-23 ENCOUNTER — Other Ambulatory Visit: Payer: Self-pay

## 2019-12-23 ENCOUNTER — Encounter: Payer: Self-pay | Admitting: Emergency Medicine

## 2019-12-23 ENCOUNTER — Emergency Department
Admission: EM | Admit: 2019-12-23 | Discharge: 2019-12-23 | Disposition: A | Discharge status: Home / Self Care | Payer: 59 | Attending: Student | Admitting: Student

## 2019-12-23 DIAGNOSIS — R0789 Other chest pain: Secondary | ICD-10-CM | POA: Insufficient documentation

## 2019-12-23 DIAGNOSIS — Z79899 Other long term (current) drug therapy: Secondary | ICD-10-CM | POA: Insufficient documentation

## 2019-12-23 DIAGNOSIS — J45909 Unspecified asthma, uncomplicated: Secondary | ICD-10-CM | POA: Diagnosis not present

## 2019-12-23 DIAGNOSIS — F17200 Nicotine dependence, unspecified, uncomplicated: Secondary | ICD-10-CM | POA: Insufficient documentation

## 2019-12-23 DIAGNOSIS — I10 Essential (primary) hypertension: Secondary | ICD-10-CM | POA: Diagnosis not present

## 2019-12-23 MED ORDER — LIDOCAINE 5 % EX PTCH: 1.0000 | MEDICATED_PATCH | Freq: Two times a day (BID) | CUTANEOUS | 0 refills | Status: DC

## 2019-12-23 MED ORDER — TRAMADOL HCL 50 MG PO TABS: 50.0000 mg | ORAL_TABLET | Freq: Four times a day (QID) | ORAL | 0 refills | Status: DC | PRN

## 2019-12-23 MED ORDER — KETOROLAC TROMETHAMINE 10 MG PO TABS: 10.0000 mg | ORAL_TABLET | Freq: Four times a day (QID) | ORAL | 0 refills | Status: DC | PRN

## 2019-12-23 MED ORDER — ORPHENADRINE CITRATE 30 MG/ML IJ SOLN
60.0000 mg | Freq: Two times a day (BID) | INTRAMUSCULAR | Status: DC
  Administered 2019-12-23: 60 mg via INTRAMUSCULAR
  Filled 2019-12-23: qty 2

## 2019-12-23 MED ORDER — HYDROMORPHONE HCL 1 MG/ML IJ SOLN
1.0000 mg | Freq: Once | INTRAMUSCULAR | Status: AC
  Administered 2019-12-23: 1 mg via INTRAMUSCULAR
  Filled 2019-12-23: qty 1

## 2019-12-23 MED ORDER — KETOROLAC TROMETHAMINE 60 MG/2ML IM SOLN
60.0000 mg | Freq: Once | INTRAMUSCULAR | Status: AC
  Administered 2019-12-23: 60 mg via INTRAMUSCULAR
  Filled 2019-12-23: qty 2

## 2019-12-23 MED ORDER — CYCLOBENZAPRINE HCL 10 MG PO TABS: 10.0000 mg | ORAL_TABLET | Freq: Three times a day (TID) | ORAL | 0 refills | Status: DC | PRN

## 2019-12-23 NOTE — ED Triage Notes (Signed)
Pt here for right side chest pain since December when he fell.  No pain at all if not moving. Pain hurst with moving arm, if he sneezes, turns, and with palpation.  No fever.  Pt is daily drinker, drinks about 6 beers per day.  Last drink last night.

## 2019-12-23 NOTE — ED Notes (Signed)
Pt c/o sharp CP x1 month since a recent fall. Pt states he has CP anytime he moves, coughs, or sneezes.

## 2019-12-23 NOTE — Discharge Instructions (Addendum)
Follow discharge care instruction take medication as directed. °

## 2019-12-23 NOTE — ED Provider Notes (Signed)
[facility] Emergency Department Provider Note  ____________________________________________   First MD Initiated Contact with Patient 12/23/19 1047     (approximate)  I have reviewed the triage vital signs and the nursing notes.   HISTORY  Chief Complaint chest wall pain    HPI Kanaan Goodspeed is a 59 y.o. male patient complain right upper chest wall pain for 1 month.  Pain started after he fell.  Patient states the pain is localized to the right upper chest wall area.  Patient the pain increases with movement of the arms, sneezing, movement and palpation.  Patient denies dyspnea or fever with complaint.  Patient has chest x-rays and angiograms with no acute findings for the chest wall pain.  Patient angiogram reveals some nodular lesions which were evaluated by oncology with no acute findings.  Patient states throughout this course he was not given any anti-inflammatory, muscle relaxer, or pain medication.         Past Medical History:  Diagnosis Date  . 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 Arthritis   . Asthma   . Brain tumor (benign) ([place])    benign pituitary neoplasm  . Chronic pain    right arm  . Depression   . Environmental and seasonal allergies   . Hypertension   . Pneumonia   . Sleep apnea    does not wear CPAP ; uses humidifier instead    Patient Active Problem List   Diagnosis Date Noted  . Erectile dysfunction due to arterial insufficiency 08/10/2019  . Bipolar 1 disorder, mixed, mild ([place]) 07/19/2019  . PTSD (post-traumatic stress disorder) 07/19/2019  . Panic disorder 07/19/2019  . Tobacco use disorder 07/19/2019  . Moderate episode of recurrent major depressive disorder ([place]) 05/25/2019  . S/P lumbar fusion 02/22/2019  . Alcohol use disorder, severe, in early remission ([place]) 01/01/2019  . Disorder of bursae of shoulder region 07/21/2017  . Full thickness rotator cuff tear 07/21/2017  . Localized, primary osteoarthritis  07/21/2017  . 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 Osteoarthritis of knee 07/21/2017  . Anxiety 01/10/2016  . BP (high blood pressure) 01/10/2016  . Apnea, sleep 01/10/2016  . Hyperlipidemia 10/15/2015  . Pituitary adenoma ([place]) 10/15/2015  . Hypogonadism in male 10/15/2015  . Depression 10/15/2015  . 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 Allergic rhinitis 10/15/2015  . Incomplete bladder emptying 05/30/2015  . Hernia, inguinal, left 09/21/2014  . Benign prostatic hyperplasia with lower urinary tract symptoms 10/24/2012  . 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 OSTEOMYELITIS, ACUTE, OTHER [facility] SITE 06/09/2006    Past Surgical History:  Procedure Laterality Date  . ANTERIOR CERVICAL DECOMP/DISCECTOMY FUSION N/A 06/17/2016   Procedure: ANTERIOR CERVICAL DECOMPRESSION FUSION, CERVICAL 3-4, CERVICAL 4-5 WITH INSTRUMENTATION AND ALLOGRAFT;  Surgeon: Phylliss Bob, MD;  Location: [place];  Service: Orthopedics;  Laterality: N/A;  ANTERIOR CERVICAL DECOMPRESSION FUSION, CERVICAL 3-4, CERVICAL 4-5 WITH INSTRUMENTATION AND ALLOGRAFT  . BACK SURGERY    . NECK SURGERY  2009    Prior to Admission medications   Medication Sig Start Date End Date Taking? Authorizing Provider  albuterol (VENTOLIN HFA) 108 (90 Base) MCG/ACT inhaler INHALE 2 PUFFS BY MOUTH EVERY 6 HOURS ASNEEDED FOR WHEEZING 10/19/19   Carles Collet M, PA-C  amLODipine (NORVASC) 5 MG tablet Take 1 tablet (5 mg total) by mouth daily. 10/03/19   Chrismon, Vickki Muff, PA  Arginine 2000 MG PACK Take by mouth daily.    [provider]  atorvastatin (LIPITOR) 10  MG tablet Take 1 tablet (10 mg total) by mouth at bedtime. 02/20/19   Trinna Post, PA-C  benzonatate (TESSALON) 200 MG capsule Take 1 capsule (200 mg total) by mouth 3 (three) times daily as needed. 11/23/19   Mar Daring, PA-C  benztropine (COGENTIN) 0.5 MG tablet Take 1 tablet (0.5 mg total) by mouth daily as needed. For muscle cramps and side  effects of olanzapine 11/08/19   Ursula Alert, MD  Carbonyl Iron (PERFECT IRON) 25 MG TABS Take by mouth daily.    [provider]  Coenzyme Q10 (COQ-10) 100 MG CAPS Take by mouth daily.    [provider]  colchicine 0.6 MG tablet Take 2 tablets (1.2mg ) by mouth at first sign of gout flare followed by 1 tablet (0.6mg ) after 1 hour. (Max 1.8mg  within 1 hour) 12/12/18   [provider]  Cyanocobalamin (B-12) 5000 MCG SUBL Place under the tongue daily.    [provider]  cyclobenzaprine (FLEXERIL) 10 MG tablet Take 1 tablet (10 mg total) by mouth 3 (three) times daily as needed for muscle spasms. Patient not taking: Reported on 12/08/2019 10/24/19   Chrismon, Vickki Muff, PA  cyclobenzaprine (FLEXERIL) 10 MG tablet Take 1 tablet (10 mg total) by mouth 3 (three) times daily as needed. 12/23/19   Sable Feil, PA-C  DHEA 50 MG CAPS Take by mouth.    [provider]  DULoxetine (CYMBALTA) 30 MG capsule TAKE 2 CAPSULES BY MOUTH ONCE DAILY 11/06/19   Trinna Post, PA-C  EUCRISA 2 % OINT Apply  a small amount to affected area   qd/bid to aa's chest, inframammary, eye 08/30/19   [provider]  famotidine (PEPCID) 20 MG tablet Take 1 tablet (20 mg total) by mouth 2 (two) times daily. 07/27/19 12/08/19  Trinna Post, PA-C  finasteride (PROSCAR) 5 MG tablet Take 1 tablet (5 mg total) by mouth daily. 12/05/18   Stoioff, Ronda Fairly, MD  fluticasone (FLONASE) 50 MCG/ACT nasal spray Place into the nose. 01/06/14   [provider]  gabapentin (NEURONTIN) 300 MG capsule Take 1 capsule (300 mg total) by mouth 2 (two) times daily. 11/30/19   Ursula Alert, MD  Ginger, Zingiber officinalis, (GINGER PO) Take by mouth.    [provider]  Glucosamine 500 MG CAPS Take by mouth daily.    [provider]  hydrOXYzine (VISTARIL) 25 MG capsule  10/15/19   [provider]  indomethacin (INDOCIN) 50 MG capsule TAKE 1 CAPSULE BY MOUTH 3  TIMES PER DAY WITH MEALS 03/17/19   Jerrol Banana., MD  ketorolac (TORADOL) 10 MG tablet Take 1 tablet (10 mg total) by mouth every 6 (six) hours as needed. 12/23/19   Sable Feil, PA-C  lidocaine (LIDODERM) 5 % Place 1 patch onto the skin every 12 (twelve) hours. Remove & Discard patch within 12 hours or as directed by MD 12/23/19 12/22/20  Sable Feil, PA-C  loratadine (CLARITIN) 10 MG tablet Take 10 mg by mouth daily.  12/18/14   [provider]  magnesium oxide (MAG-OX) 400 MG tablet Take 400 mg by mouth daily.    [provider]  meloxicam (MOBIC) 15 MG tablet Take 1 tablet (15 mg total) by mouth daily. 12/05/19   Trinna Post, PA-C  methocarbamol (ROBAXIN) 500 MG tablet Take 1 tablet (500 mg total) by mouth every 8 (eight) hours as needed for muscle spasms. 10/02/19   Chrismon, Vickki Muff, PA  metoprolol  succinate (TOPROL-XL) 25 MG 24 hr tablet TAKE 1 TABLET BY MOUTH ONCE DAILY 10/19/19   Jerrol Banana., MD  montelukast (SINGULAIR) 10 MG tablet Take 1 tablet (10 mg total) by mouth at bedtime. 09/18/19   Trinna Post, PA-C  naproxen sodium (ANAPROX) 220 MG tablet Take 220 mg by mouth 2 (two) times daily with a meal.    [provider]  OLANZapine (ZYPREXA) 10 MG tablet Take 1 tablet (10 mg total) by mouth at bedtime. 11/08/19   Ursula Alert, MD  Omega-3 Fatty Acids (FISH OIL) 1000 MG CAPS Take by mouth.    [provider]  omeprazole (PRILOSEC) 20 MG capsule TAKE 1 CAPSULE BY MOUTH TWICE DAILY BEFORE MEALS 09/19/19   Pollak, Wendee Beavers, PA-C  OVER THE COUNTER MEDICATION OREGA MAX    [provider]  OVER THE COUNTER MEDICATION RED MARINE ALGAE-375 MG.    [provider]  predniSONE (DELTASONE) 10 MG tablet Take tablets by mouth on a taper starting 6 day 1, 5 day 2, 4 day 3, 3 day 4, 2 day 5 and 1 day 6 (spread out doses between meals). Patient not taking: Reported on 12/08/2019 10/24/19   Chrismon, Vickki Muff, PA   sildenafil (REVATIO) 20 MG tablet 3-5 tablets daily as needed for erectile dysfunction 08/10/19   Stoioff, Ronda Fairly, MD  tacrolimus (PROTOPIC) 0.1 % ointment Apply 1 application topically 2 (two) times daily. 09/11/19   [provider]  tamsulosin (FLOMAX) 0.4 MG CAPS capsule Take 1 capsule (0.4 mg total) by mouth daily. 08/10/19   Stoioff, Ronda Fairly, MD  testosterone cypionate (DEPOTESTOSTERONE CYPIONATE) 200 MG/ML injection Inject 0.5 mLs (100 mg total) into the muscle once a week. 08/10/19   Stoioff, Ronda Fairly, MD  traMADol (ULTRAM) 50 MG tablet Take 1 tablet (50 mg total) by mouth every 6 (six) hours as needed. 12/23/19 12/22/20  Sable Feil, PA-C  triamcinolone cream (KENALOG) 0.1 % Apply 1 application topically 2 (two) times daily. 05/23/19   Trinna Post, PA-C    Allergies Penicillins, Tetanus toxoids, Lisinopril, Losartan, Tetanus toxoid, Codeine, and Doxycycline  Family History  Problem Relation Age of Onset  . Diabetes Mother   . Diabetes Other     Social History Social History   Tobacco Use  . Smoking status: Light Tobacco Smoker    Years: 20.00    Types: E-cigarettes, Cigars  . Smokeless tobacco: Never Used  . Tobacco comment: " sometimes during the day and at night"  Substance Use Topics  . Alcohol use: Not Currently    Alcohol/week: 0.0 standard drinks    Comment: 12 pack a beer a night  . Drug use: No    Review of Systems Constitutional: No fever/chills Eyes: No visual changes. ENT: No sore throat. Cardiovascular: Denies chest pain. Respiratory: Denies shortness of breath. Gastrointestinal: No abdominal pain.  No nausea, no vomiting.  No diarrhea.  No constipation. Genitourinary: Negative for dysuria.  Erectile dysfunction Musculoskeletal: Right upper chest wall pain. Skin: Negative for rash. Neurological: Negative for headaches, focal weakness or numbness. Psychiatric: Alcohol abuse, anxiety, bipolar, depression, and PTSD.  Endocrine:   Hyperlipidemia Hematological/Lymphatic:  Allergic/Immunilogical: Penicillin, tetanus, lisinopril, codeine, doxycycline. ____________________________________________   PHYSICAL EXAM:  VITAL SIGNS: ED Triage Vitals  Enc Vitals Group     BP 12/23/19 1026 125/82     Pulse Rate 12/23/19 1026 99     Resp 12/23/19 1026 20     Temp 12/23/19 1026 98.1 F (36.7  C)     Temp Source 12/23/19 1026 Oral     SpO2 12/23/19 1026 93 %     Weight 12/23/19 1022 250 lb (113.4 kg)     Height 12/23/19 1022 6\' 2"  (1.88 m)     Head Circumference --      Peak Flow --      Pain Score 12/23/19 1022 0     Pain Loc --      Pain Edu? --      Excl. in [place]? --    Constitutional: Alert and oriented. Well appearing and in no acute distress. Neck:No cervical spine tenderness to palpation Hematological/Lymphatic/Immunilogical: No cervical lymphadenopathy. Cardiovascular: Normal rate, regular rhythm. Grossly normal heart sounds.  Good peripheral circulation. Respiratory: Normal respiratory effort.  No retractions. Lungs CTAB. Gastrointestinal: Soft and nontender. No distention. No abdominal bruits. No CVA tenderness. Musculoskeletal: Moderate guarding palpation right anterior chest wall.  Neurologic:  Normal speech and language. No gross focal neurologic deficits are appreciated. No gait instability. Skin:  Skin is warm, dry and intact. No rash noted.  No abrasion or ecchymosis. Psychiatric: Mood and affect are normal. Speech and behavior are normal.  ____________________________________________   LABS (all labs ordered are listed, but only abnormal results are displayed)  Labs Reviewed - No data to display ____________________________________________  EKG   ____________________________________________  RADIOLOGY  ED MD interpretation:    Official radiology report(s): No results found.  ____________________________________________   PROCEDURES  Procedure(s) performed (including Critical Care):   Procedures   ____________________________________________   INITIAL IMPRESSION / ASSESSMENT AND PLAN / ED COURSE  As part of my medical decision making, I reviewed the following data within the [facility]     Patient presents with right upper chest wall pain secondary to a fall 1 month ago.  Reviewed images consisting of x-rays and CT angiogram of the chest with no acute findings.  I was able to reproduce patient complaint to distraction and palpation.  Physical exam consistent with muscle skeletal pain.  Patient given discharge care instruction advised take medication directed.  Patient advised follow-up PCP.    Rollins Pusey was evaluated in Emergency Department on 12/23/2019 for the symptoms described in the history of present illness. He was evaluated in the context of the global COVID-19 pandemic, which necessitated consideration that the patient might be at risk for infection with the SARS-CoV-2 virus that causes COVID-19. Institutional protocols and algorithms that pertain to the evaluation of patients at risk for COVID-19 are in a state of rapid change based on information released by regulatory bodies including the CDC and federal and state organizations. These policies and algorithms were followed during the patient's care in the ED.       ____________________________________________   FINAL CLINICAL IMPRESSION(S) / ED DIAGNOSES  Final diagnoses:  Right-sided chest wall pain     ED Discharge Orders         Ordered    traMADol (ULTRAM) 50 MG tablet  Every 6 hours PRN     12/23/19 1158    cyclobenzaprine (FLEXERIL) 10 MG tablet  3 times daily PRN     12/23/19 1158    ketorolac (TORADOL) 10 MG tablet  Every 6 hours PRN     12/23/19 1158    lidocaine (LIDODERM) 5 %  Every 12 hours     12/23/19 1158           Note:  This document was prepared using Dragon voice recognition software and may  include unintentional dictation errors.    Sable Feil, PA-C 12/23/19 1201    Lilia Pro., MD 12/24/19 1911

## 2019-12-25 ENCOUNTER — Other Ambulatory Visit: Payer: Self-pay | Admitting: Physician Assistant

## 2019-12-25 NOTE — Telephone Encounter (Signed)
Requested medication (s) are due for refill today: yes  Requested medication (s) are on the active medication list: yes  Last refill: historic provider  Future visit scheduled: no  Notes to clinic:  historic provider    Requested Prescriptions  Pending Prescriptions Disp Refills   hydrOXYzine (VISTARIL) 25 MG capsule [Pharmacy Med Name: HYDROXYZINE PAMOATE 25 MG CAP] 120 capsule     Sig: TAKE 1 CAPSULE BY MOUTH 4 TIMES DAILY ASNEEDED      Ear, Nose, and Throat:  Antihistamines Passed - 12/25/2019  4:46 PM      Passed - Valid encounter within last 12 months    Recent Outpatient Visits           2 weeks ago Lymphadenopathy   [place], [place], PA-C   1 month ago Cough   [place], [place]   2 months ago Acute low back pain without sciatica, unspecified back pain laterality   [place], [place]   2 months ago Acute right-sided low back pain without sciatica   [facility], [place], [place]   5 months ago Cough   [facility], Wendee Beavers, [place]       Future Appointments             In 4 days Sindy [place], [place]   In 7 months [place], Ronda Fairly, [facility]

## 2019-12-28 ENCOUNTER — Other Ambulatory Visit: Payer: Self-pay

## 2019-12-28 ENCOUNTER — Ambulatory Visit (INDEPENDENT_AMBULATORY_CARE_PROVIDER_SITE_OTHER): Payer: Self-pay | Admitting: Psychiatry

## 2019-12-28 DIAGNOSIS — Z5329 Procedure and treatment not carried out because of patient's decision for other reasons: Secondary | ICD-10-CM

## 2019-12-28 NOTE — Progress Notes (Signed)
Attempted to call patient several times, left message.

## 2019-12-29 ENCOUNTER — Inpatient Hospital Stay (HOSPITAL_BASED_OUTPATIENT_CLINIC_OR_DEPARTMENT_OTHER): Payer: 59 | Admitting: Oncology

## 2019-12-29 ENCOUNTER — Encounter: Payer: Self-pay | Admitting: Oncology

## 2019-12-29 ENCOUNTER — Other Ambulatory Visit: Payer: Self-pay

## 2019-12-29 DIAGNOSIS — R590 Localized enlarged lymph nodes: Secondary | ICD-10-CM

## 2019-12-29 NOTE — Progress Notes (Signed)
Patient stated that she had been doing well. However, he stated that he continues to have chest pain when he coughs.

## 2020-01-01 NOTE — Progress Notes (Signed)
I connected with Russell Simpson on 01/01/20 at  2:15 PM EST by video enabled telemedicine visit and verified that I am speaking with the correct person using two identifiers.   I discussed the limitations, risks, security and privacy concerns of performing an evaluation and management service by telemedicine and the availability of in-person appointments. I also discussed with the patient that there may be a patient responsible charge related to this service. The patient expressed understanding and agreed to proceed.  Other persons participating in the visit and their role in the encounter:  none  Patient's location:  home Provider's location:  work  Risk analyst Complaint: Routine follow-up of axillary adenopathy  History of present illness: patient is a 59 year old male with a past medical history significant for hypertension, hyperlipidemia, hypogonadism, pituitary adenoma who recently had a fall on in December 2020. Russell Simpson  He sustained a left anterior frontal sinus as well as supraorbital rim fracture on 11/21/2019 which was managed conservatively.  He then presented to the ER at [facility] with symptoms of right-sided chest wall pain this led to a CT PE which did not show any evidence of pulmonary embolism.  He was noted to have bilateral symmetric axillary adenopathy measuring up to 1.8 cm.  Scattered mediastinal adenopathy.  Right paratracheal node measuring up to 1.2 cm.  Prominent right costophrenic lymph node measuring up to 1 cm.  Findings are nonspecific but could be seen with lymphoma versus systemic inflammatory disease such as sarcoidosis.  Of note patient has had a prior CT scan for lung screening back in 2015 when he was not noted to have this adenopathy.   Interval history: Patient still reports having a right-sided chest wall pain but it is slowly getting better.   Review of Systems  Constitutional: Negative for chills, fever, malaise/fatigue and weight loss.  HENT: Negative for congestion, ear  discharge and nosebleeds.   Eyes: Negative for blurred vision.  Respiratory: Negative for cough, hemoptysis, sputum production, shortness of breath and wheezing.   Cardiovascular: Negative for chest pain, palpitations, orthopnea and claudication.  Gastrointestinal: Negative for abdominal pain, blood in stool, constipation, diarrhea, heartburn, melena, nausea and vomiting.  Genitourinary: Negative for dysuria, flank pain, frequency, hematuria and urgency.  Musculoskeletal: Negative for back pain, joint pain and myalgias.       Right-sided chest wall pain  Skin: Negative for rash.  Neurological: Negative for dizziness, tingling, focal weakness, seizures, weakness and headaches.  Endo/Heme/Allergies: Does not bruise/bleed easily.  Psychiatric/Behavioral: Negative for depression and suicidal ideas. The patient does not have insomnia.     Allergies  Allergen Reactions  . Penicillins Anaphylaxis  . Tetanus Toxoids Swelling  . Lisinopril     Other reaction(s): Other (See Comments) Other reaction(s): Cough Other reaction(s): Cough Other reaction(s): Cough Other reaction(s): Cough  . Losartan     Other reaction(s): Other (See Comments) Other reaction(s): Muscle Pain Other reaction(s): Muscle Pain Other reaction(s): Muscle Pain  . Tetanus Toxoid   . Codeine Nausea Only  . Doxycycline Rash    Past Medical History:  Diagnosis Date  . 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 Arthritis   . Asthma   . Brain tumor (benign) ([place])    benign pituitary neoplasm  . Chronic pain    right arm  . Depression   . Environmental and seasonal allergies   . Hypertension   . Pneumonia   . Sleep apnea    does not wear CPAP ; uses humidifier instead    Past Surgical  History:  Procedure Laterality Date  . ANTERIOR CERVICAL DECOMP/DISCECTOMY FUSION N/A 06/17/2016   Procedure: ANTERIOR CERVICAL DECOMPRESSION FUSION, CERVICAL 3-4, CERVICAL 4-5 WITH INSTRUMENTATION AND ALLOGRAFT;  Surgeon: Phylliss Bob,  MD;  Location: [place];  Service: Orthopedics;  Laterality: N/A;  ANTERIOR CERVICAL DECOMPRESSION FUSION, CERVICAL 3-4, CERVICAL 4-5 WITH INSTRUMENTATION AND ALLOGRAFT  . BACK SURGERY    . NECK SURGERY  2009    Social History   Socioeconomic History  . Marital status: Divorced    Spouse name: Not on file  . Number of children: Not on file  . Years of education: Not on file  . Highest education level: Not on file  Occupational History  . Not on file  Tobacco Use  . Smoking status: Light Tobacco Smoker    Years: 20.00    Types: E-cigarettes, Cigars  . Smokeless tobacco: Never Used  . Tobacco comment: " sometimes during the day and at night"  Substance and Sexual Activity  . Alcohol use: Not Currently    Alcohol/week: 0.0 standard drinks    Comment: 12 pack a beer a night  . Drug use: No  . Sexual activity: Not Currently  Other Topics Concern  . Not on file  Social History Narrative  . Not on file   Social Determinants of Health   Financial Resource Strain:   . Difficulty of Paying Living Expenses: Not on file  Food Insecurity:   . Worried About Charity fundraiser in the Last Year: Not on file  . Ran Out of Food in the Last Year: Not on file  Transportation Needs:   . Lack of Transportation (Medical): Not on file  . Lack of Transportation (Non-Medical): Not on file  Physical Activity: Insufficiently Active  . Days of Exercise per Week: 1 day  . Minutes of Exercise per Session: 10 min  Stress: Stress Concern Present  . Feeling of Stress : Rather much  Social Connections: Moderately Isolated  . Frequency of Communication with Friends and Family: Twice a week  . Frequency of Social Gatherings with Friends and Family: Once a week  . Attends Religious Services: Never  . Active Member of Clubs or Organizations: No  . Attends Archivist Meetings: Never  . Marital Status: Divorced  Human resources officer Violence:   . Fear of Current or Ex-Partner: Not on file  .  Emotionally Abused: Not on file  . Physically Abused: Not on file  . Sexually Abused: Not on file    Family History  Problem Relation Age of Onset  . Diabetes Mother   . Diabetes Other      Current Outpatient Medications:  .  albuterol (VENTOLIN HFA) 108 (90 Base) MCG/ACT inhaler, INHALE 2 PUFFS BY MOUTH EVERY 6 HOURS ASNEEDED FOR WHEEZING, Disp: 8.5 g, Rfl: 4 .  amLODipine (NORVASC) 5 MG tablet, Take 1 tablet (5 mg total) by mouth daily., Disp: 90 tablet, Rfl: 3 .  Arginine 2000 MG PACK, Take by mouth daily., Disp: , Rfl:  .  atorvastatin (LIPITOR) 10 MG tablet, Take 1 tablet (10 mg total) by mouth at bedtime., Disp: 90 tablet, Rfl: 0 .  benzonatate (TESSALON) 200 MG capsule, Take 1 capsule (200 mg total) by mouth 3 (three) times daily as needed., Disp: 30 capsule, Rfl: 0 .  benztropine (COGENTIN) 0.5 MG tablet, Take 1 tablet (0.5 mg total) by mouth daily as needed. For muscle cramps and side effects of olanzapine, Disp: 90 tablet, Rfl: 0 .  Carbonyl Iron (PERFECT  IRON) 25 MG TABS, Take by mouth daily., Disp: , Rfl:  .  Coenzyme Q10 (COQ-10) 100 MG CAPS, Take by mouth daily., Disp: , Rfl:  .  Cyanocobalamin (B-12) 5000 MCG SUBL, Place under the tongue daily., Disp: , Rfl:  .  cyclobenzaprine (FLEXERIL) 10 MG tablet, Take 1 tablet (10 mg total) by mouth 3 (three) times daily as needed for muscle spasms., Disp: 30 tablet, Rfl: 0 .  DHEA 50 MG CAPS, Take by mouth., Disp: , Rfl:  .  DULoxetine (CYMBALTA) 30 MG capsule, TAKE 2 CAPSULES BY MOUTH ONCE DAILY, Disp: 60 capsule, Rfl: 2 .  EUCRISA 2 % OINT, Apply  a small amount to affected area   qd/bid to aa's chest, inframammary, eye, Disp: , Rfl:  .  famotidine (PEPCID) 20 MG tablet, Take 1 tablet (20 mg total) by mouth 2 (two) times daily., Disp: 180 tablet, Rfl: 0 .  finasteride (PROSCAR) 5 MG tablet, Take 1 tablet (5 mg total) by mouth daily., Disp: 90 tablet, Rfl: 3 .  fluticasone (FLONASE) 50 MCG/ACT nasal spray, Place into the nose.,  Disp: , Rfl:  .  gabapentin (NEURONTIN) 300 MG capsule, Take 1 capsule (300 mg total) by mouth 2 (two) times daily., Disp: 180 capsule, Rfl: 0 .  Ginger, Zingiber officinalis, (GINGER PO), Take by mouth., Disp: , Rfl:  .  Glucosamine 500 MG CAPS, Take by mouth daily., Disp: , Rfl:  .  hydrOXYzine (VISTARIL) 25 MG capsule, TAKE 1 CAPSULE BY MOUTH 4 TIMES DAILY ASNEEDED, Disp: 120 capsule, Rfl: 0 .  indomethacin (INDOCIN) 50 MG capsule, TAKE 1 CAPSULE BY MOUTH 3 TIMES PER DAY WITH MEALS, Disp: 90 capsule, Rfl: 5 .  ketorolac (TORADOL) 10 MG tablet, Take 1 tablet (10 mg total) by mouth every 6 (six) hours as needed., Disp: 20 tablet, Rfl: 0 .  lidocaine (LIDODERM) 5 %, Place 1 patch onto the skin every 12 (twelve) hours. Remove & Discard patch within 12 hours or as directed by MD, Disp: 10 patch, Rfl: 0 .  loratadine (CLARITIN) 10 MG tablet, Take 10 mg by mouth daily. , Disp: , Rfl:  .  magnesium oxide (MAG-OX) 400 MG tablet, Take 400 mg by mouth daily., Disp: , Rfl:  .  meloxicam (MOBIC) 15 MG tablet, Take 1 tablet (15 mg total) by mouth daily., Disp: 30 tablet, Rfl: 0 .  methocarbamol (ROBAXIN) 500 MG tablet, Take 1 tablet (500 mg total) by mouth every 8 (eight) hours as needed for muscle spasms., Disp: 30 tablet, Rfl: 0 .  metoprolol succinate (TOPROL-XL) 25 MG 24 hr tablet, TAKE 1 TABLET BY MOUTH ONCE DAILY, Disp: 30 tablet, Rfl: 3 .  montelukast (SINGULAIR) 10 MG tablet, Take 1 tablet (10 mg total) by mouth at bedtime., Disp: 30 tablet, Rfl: 5 .  naproxen sodium (ANAPROX) 220 MG tablet, Take 220 mg by mouth 2 (two) times daily with a meal., Disp: , Rfl:  .  OLANZapine (ZYPREXA) 10 MG tablet, Take 1 tablet (10 mg total) by mouth at bedtime., Disp: 90 tablet, Rfl: 0 .  Omega-3 Fatty Acids (FISH OIL) 1000 MG CAPS, Take by mouth., Disp: , Rfl:  .  omeprazole (PRILOSEC) 20 MG capsule, TAKE 1 CAPSULE BY MOUTH TWICE DAILY BEFORE MEALS, Disp: 60 capsule, Rfl: 2 .  OVER THE COUNTER MEDICATION, OREGA MAX,  Disp: , Rfl:  .  OVER THE COUNTER MEDICATION, RED MARINE ALGAE-375 MG., Disp: , Rfl:  .  sildenafil (REVATIO) 20 MG tablet, 3-5 tablets daily as  needed for erectile dysfunction, Disp: 30 tablet, Rfl: 3 .  tacrolimus (PROTOPIC) 0.1 % ointment, Apply 1 application topically 2 (two) times daily., Disp: , Rfl:  .  tamsulosin (FLOMAX) 0.4 MG CAPS capsule, Take 1 capsule (0.4 mg total) by mouth daily., Disp: 90 capsule, Rfl: 3 .  testosterone cypionate (DEPOTESTOSTERONE CYPIONATE) 200 MG/ML injection, Inject 0.5 mLs (100 mg total) into the muscle once a week., Disp: 10 mL, Rfl: 0 .  triamcinolone cream (KENALOG) 0.1 %, Apply 1 application topically 2 (two) times daily., Disp: 30 g, Rfl: 0 .  colchicine 0.6 MG tablet, Take 2 tablets (1.2mg ) by mouth at first sign of gout flare followed by 1 tablet (0.6mg ) after 1 hour. (Max 1.8mg  within 1 hour), Disp: , Rfl:  .  cyclobenzaprine (FLEXERIL) 10 MG tablet, Take 1 tablet (10 mg total) by mouth 3 (three) times daily as needed. (Patient not taking: Reported on 12/29/2019), Disp: 15 tablet, Rfl: 0  No results found.  No images are attached to the encounter.   CMP Latest Ref Rng & Units 12/08/2019  Glucose 70 - 99 mg/dL 135(H)  BUN 6 - 20 mg/dL 15  Creatinine 0.61 - 1.24 mg/dL 1.07  Sodium 135 - 145 mmol/L 133(L)  Potassium 3.5 - 5.1 mmol/L 4.0  Chloride 98 - 111 mmol/L 101  CO2 22 - 32 mmol/L 19(L)  Calcium 8.9 - 10.3 mg/dL 9.0  Total Protein 6.5 - 8.1 g/dL 7.6  Total Bilirubin 0.3 - 1.2 mg/dL 0.5  Alkaline Phos 38 - 126 U/L 105  AST 15 - 41 U/L 29  ALT 0 - 44 U/L 35   CBC Latest Ref Rng & Units 12/08/2019  WBC 4.0 - 10.5 K/uL 8.7  Hemoglobin 13.0 - 17.0 g/dL 17.0  Hematocrit 39.0 - 52.0 % 50.2  Platelets 150 - 400 K/uL 213     Observation/objective: Appears in no acute distress over video visit today.  Breathing is nonlabored  Assessment and plan: Patient is a 59 year old male referred for axillary and mediastinal adenopathy  We are unable to  obtain an axillary lymph node biopsy at this time as the nodes are still small and nonspecific.  I explained this to the patient I will plan to get a repeat CT scan 3 months from now To assess the status of his lymph nodes.  If they still persist plan would be bronchoscopy versus CT-guided biopsy of the axillary lymph node.  Suspect right-sided chest wall pain is musculoskeletal.  Continue to monitor  Follow-up instructions: I will see him back for a video visit after CT scans In 3 months   I discussed the assessment and treatment plan with the patient. The patient was provided an opportunity to ask questions and all were answered. The patient agreed with the plan and demonstrated an understanding of the instructions.   The patient was advised to call back or seek an in-person evaluation if the symptoms worsen or if the condition fails to improve as anticipated.   Visit Diagnosis: 1. Axillary adenopathy     Dr. Randa Evens, MD, MPH [facility] [place] Tel- [phone] 01/01/2020 10:48 AM

## 2020-01-25 ENCOUNTER — Telehealth: Payer: Self-pay

## 2020-01-25 NOTE — Telephone Encounter (Signed)
Returned call to patient, left voicemail to call back.

## 2020-01-25 NOTE — Telephone Encounter (Signed)
pt called left message that he needs something stronger for his panic attacks the medication he is on now is not working

## 2020-01-29 ENCOUNTER — Telehealth: Payer: Self-pay

## 2020-01-29 NOTE — Telephone Encounter (Signed)
Pt calls and states that his testosterone dosage has changed without him being aware. Pt states that current dosage is 0.29ml at the pharmacy and he has always done 103mL. I do see 35mL in the past. Pt did not pick up RX if new rx needs to be sent in with dose change. Please advise

## 2020-01-30 ENCOUNTER — Telehealth: Payer: Self-pay

## 2020-01-30 NOTE — Telephone Encounter (Signed)
Patient just called and stated that he didn't hear his phone ring at all. He asked if you could call him back

## 2020-01-30 NOTE — Telephone Encounter (Signed)
   Returned call to patient again-attempted multiple times.  It always goes into voicemail straight.  Left a voice message for patient to call the office back since he is past due his appointment.  Patient was supposed to return for an appointment in January which he did not keep.  Patient needs to schedule an appointment to be evaluated.

## 2020-01-30 NOTE — Telephone Encounter (Signed)
Patient called and stated that he's having increased panic attacks. Please review and advise. He uses [name] on [address] in [place]. Thank you.

## 2020-01-30 NOTE — Telephone Encounter (Signed)
Attempted to call patient left voicemail.

## 2020-01-30 NOTE — Telephone Encounter (Signed)
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  Nothing in those records indicate he was receiving 1 mL.  He is scheduled for a testosterone level next week and the dose can be adjusted if needed.

## 2020-01-30 NOTE — Telephone Encounter (Signed)
Notified patient as instructed, patient pleased. Discussed follow-up appointments, patient agrees He will be here on 02/07/2020 for labs

## 2020-02-02 ENCOUNTER — Telehealth: Payer: Self-pay | Admitting: Urology

## 2020-02-02 ENCOUNTER — Telehealth: Payer: Self-pay

## 2020-02-02 NOTE — Telephone Encounter (Signed)
Patient called asking if we have received a PA for his testosterone? He gas an app next week   [name]

## 2020-02-02 NOTE — Telephone Encounter (Signed)
Patient needs appointment.

## 2020-02-02 NOTE — Telephone Encounter (Signed)
Yes , I have tried calling him back multiple times. He is past due for his appointment and needs to be evaluated .Please schedule appointment.

## 2020-02-02 NOTE — Telephone Encounter (Signed)
Waiting for his labs to come back after his appt on 02/07/2020

## 2020-02-02 NOTE — Telephone Encounter (Signed)
Medication management - Telephone call with pt after he left a message he has been trying to speak with Dr. Shea Evans for 2 weeks and no one is calling him back.  Informed pt there are messages they have been trying to reach pt and only getting his voicemail.  Patient stated he has been having some increased anxiety and questions if medications need to be adjusted.  Informed patient per record review it appeared he had missed his appointment in January and Dr. Shea Evans wants to get him rescheduled to evaluate.  Patient agreed with plan and informed would request he be called back to schedule the first available with Dr. Shea Evans.  Message sent to administrative assistant with request they schedule first available with Dr. Shea Evans.

## 2020-02-05 NOTE — Telephone Encounter (Signed)
Patient had scheduled appointment for 02/08/2020. Appointment shows up as cancelled

## 2020-02-06 ENCOUNTER — Ambulatory Visit (INDEPENDENT_AMBULATORY_CARE_PROVIDER_SITE_OTHER): Payer: 59 | Admitting: Physician Assistant

## 2020-02-06 ENCOUNTER — Other Ambulatory Visit: Payer: Self-pay

## 2020-02-06 ENCOUNTER — Other Ambulatory Visit: Payer: Self-pay | Admitting: Psychiatry

## 2020-02-06 DIAGNOSIS — F3161 Bipolar disorder, current episode mixed, mild: Secondary | ICD-10-CM | POA: Diagnosis not present

## 2020-02-06 DIAGNOSIS — F410 Panic disorder [episodic paroxysmal anxiety] without agoraphobia: Secondary | ICD-10-CM

## 2020-02-06 DIAGNOSIS — F3160 Bipolar disorder, current episode mixed, unspecified: Secondary | ICD-10-CM

## 2020-02-06 DIAGNOSIS — F4310 Post-traumatic stress disorder, unspecified: Secondary | ICD-10-CM

## 2020-02-06 DIAGNOSIS — F419 Anxiety disorder, unspecified: Secondary | ICD-10-CM

## 2020-02-06 DIAGNOSIS — E291 Testicular hypofunction: Secondary | ICD-10-CM

## 2020-02-06 DIAGNOSIS — N401 Enlarged prostate with lower urinary tract symptoms: Secondary | ICD-10-CM

## 2020-02-06 MED ORDER — HYDROXYZINE HCL 50 MG PO TABS: 50.0000 mg | ORAL_TABLET | Freq: Three times a day (TID) | ORAL | 0 refills | Status: DC | PRN

## 2020-02-06 NOTE — Progress Notes (Signed)
Patient: Russell Simpson Male    DOB: 05/14/1961   59 y.o.   MRN: [id number] Visit Date: 02/06/2020  Today's Provider: Trinna Post, PA-C   Chief Complaint  Patient presents with  . Panic Attack   Subjective:    Virtual Visit via Telephone Note  I connected with Russell Simpson on 02/06/20 at  2:40 PM EST by telephone and verified that I am speaking with the correct person using two identifiers.  Location: Patient: home Provider: office    I discussed the limitations, risks, security and privacy concerns of performing an evaluation and management service by telephone and the availability of in person appointments. I also discussed with the patient that there may be a patient responsible charge related to this service. The patient expressed understanding and agreed to proceed.  HPI  Panic Attack Patient with a history of bipolar 1 disorder currently being followed by Psychiatry. He is currently taking cymbalta 60 mg daily and olanzapine 10 mg daily as well as gabapentin 300 mg BID. He missed his January follow up in 2021 and his psychiatrist has recently been calling him and trying to schedule him. Patient states that he had a panic attack today,02/06/2020 while driving back from [place] and had to pull over on the side of the highway. Patient states that he feels like he was going to die and he really needs help with his panic attacks. Patient states that he have tried to contact his psychiatry for about 3 weeks now. He is taking olanzapine 10 mg QHS. He is also taking cymbalta 60 mg daily. Patient requests klonopin.   [phone] friend nancy, patient gives permission to talk to nancy about getting scheduled   Allergies  Allergen Reactions  . Penicillins Anaphylaxis  . Tetanus Toxoids Swelling  . Lisinopril     Other reaction(s): Other (See Comments) Other reaction(s): Cough Other reaction(s): Cough Other reaction(s): Cough Other reaction(s): Cough  . Losartan    Other reaction(s): Other (See Comments) Other reaction(s): Muscle Pain Other reaction(s): Muscle Pain Other reaction(s): Muscle Pain  . Tetanus Toxoid   . Codeine Nausea Only  . Doxycycline Rash     Current Outpatient Medications:  .  albuterol (VENTOLIN HFA) 108 (90 Base) MCG/ACT inhaler, INHALE 2 PUFFS BY MOUTH EVERY 6 HOURS ASNEEDED FOR WHEEZING, Disp: 8.5 g, Rfl: 4 .  amLODipine (NORVASC) 5 MG tablet, Take 1 tablet (5 mg total) by mouth daily., Disp: 90 tablet, Rfl: 3 .  Arginine 2000 MG PACK, Take by mouth daily., Disp: , Rfl:  .  atorvastatin (LIPITOR) 10 MG tablet, Take 1 tablet (10 mg total) by mouth at bedtime., Disp: 90 tablet, Rfl: 0 .  benztropine (COGENTIN) 0.5 MG tablet, Take 1 tablet (0.5 mg total) by mouth daily as needed. For muscle cramps and side effects of olanzapine, Disp: 90 tablet, Rfl: 0 .  Carbonyl Iron (PERFECT IRON) 25 MG TABS, Take by mouth daily., Disp: , Rfl:  .  Coenzyme Q10 (COQ-10) 100 MG CAPS, Take by mouth daily., Disp: , Rfl:  .  colchicine 0.6 MG tablet, Take 2 tablets (1.2mg ) by mouth at first sign of gout flare followed by 1 tablet (0.6mg ) after 1 hour. (Max 1.8mg  within 1 hour), Disp: , Rfl:  .  Cyanocobalamin (B-12) 5000 MCG SUBL, Place under the tongue daily., Disp: , Rfl:  .  cyclobenzaprine (FLEXERIL) 10 MG tablet, Take 1 tablet (10 mg total) by mouth 3 (three) times daily as needed for muscle  spasms., Disp: 30 tablet, Rfl: 0 .  cyclobenzaprine (FLEXERIL) 10 MG tablet, Take 1 tablet (10 mg total) by mouth 3 (three) times daily as needed., Disp: 15 tablet, Rfl: 0 .  DHEA 50 MG CAPS, Take by mouth., Disp: , Rfl:  .  DULoxetine (CYMBALTA) 30 MG capsule, TAKE 2 CAPSULES BY MOUTH ONCE DAILY, Disp: 60 capsule, Rfl: 2 .  EUCRISA 2 % OINT, Apply  a small amount to affected area   qd/bid to aa's chest, inframammary, eye, Disp: , Rfl:  .  finasteride (PROSCAR) 5 MG tablet, Take 1 tablet (5 mg total) by mouth daily., Disp: 90 tablet, Rfl: 3 .  fluticasone  (FLONASE) 50 MCG/ACT nasal spray, Place into the nose., Disp: , Rfl:  .  gabapentin (NEURONTIN) 300 MG capsule, Take 1 capsule (300 mg total) by mouth 2 (two) times daily., Disp: 180 capsule, Rfl: 0 .  Ginger, Zingiber officinalis, (GINGER PO), Take by mouth., Disp: , Rfl:  .  Glucosamine 500 MG CAPS, Take by mouth daily., Disp: , Rfl:  .  hydrOXYzine (VISTARIL) 25 MG capsule, TAKE 1 CAPSULE BY MOUTH 4 TIMES DAILY ASNEEDED, Disp: 120 capsule, Rfl: 0 .  indomethacin (INDOCIN) 50 MG capsule, TAKE 1 CAPSULE BY MOUTH 3 TIMES PER DAY WITH MEALS, Disp: 90 capsule, Rfl: 5 .  ketorolac (TORADOL) 10 MG tablet, Take 1 tablet (10 mg total) by mouth every 6 (six) hours as needed., Disp: 20 tablet, Rfl: 0 .  lidocaine (LIDODERM) 5 %, Place 1 patch onto the skin every 12 (twelve) hours. Remove & Discard patch within 12 hours or as directed by MD, Disp: 10 patch, Rfl: 0 .  loratadine (CLARITIN) 10 MG tablet, Take 10 mg by mouth daily. , Disp: , Rfl:  .  magnesium oxide (MAG-OX) 400 MG tablet, Take 400 mg by mouth daily., Disp: , Rfl:  .  meloxicam (MOBIC) 15 MG tablet, Take 1 tablet (15 mg total) by mouth daily., Disp: 30 tablet, Rfl: 0 .  methocarbamol (ROBAXIN) 500 MG tablet, Take 1 tablet (500 mg total) by mouth every 8 (eight) hours as needed for muscle spasms., Disp: 30 tablet, Rfl: 0 .  metoprolol succinate (TOPROL-XL) 25 MG 24 hr tablet, TAKE 1 TABLET BY MOUTH ONCE DAILY, Disp: 30 tablet, Rfl: 3 .  montelukast (SINGULAIR) 10 MG tablet, Take 1 tablet (10 mg total) by mouth at bedtime., Disp: 30 tablet, Rfl: 5 .  naproxen sodium (ANAPROX) 220 MG tablet, Take 220 mg by mouth 2 (two) times daily with a meal., Disp: , Rfl:  .  OLANZapine (ZYPREXA) 10 MG tablet, Take 1 tablet (10 mg total) by mouth at bedtime., Disp: 90 tablet, Rfl: 0 .  Omega-3 Fatty Acids (FISH OIL) 1000 MG CAPS, Take by mouth., Disp: , Rfl:  .  omeprazole (PRILOSEC) 20 MG capsule, TAKE 1 CAPSULE BY MOUTH TWICE DAILY BEFORE MEALS, Disp: 60  capsule, Rfl: 2 .  OVER THE COUNTER MEDICATION, OREGA MAX, Disp: , Rfl:  .  OVER THE COUNTER MEDICATION, RED MARINE ALGAE-375 MG., Disp: , Rfl:  .  sildenafil (REVATIO) 20 MG tablet, 3-5 tablets daily as needed for erectile dysfunction, Disp: 30 tablet, Rfl: 3 .  tacrolimus (PROTOPIC) 0.1 % ointment, Apply 1 application topically 2 (two) times daily., Disp: , Rfl:  .  tamsulosin (FLOMAX) 0.4 MG CAPS capsule, Take 1 capsule (0.4 mg total) by mouth daily., Disp: 90 capsule, Rfl: 3 .  testosterone cypionate (DEPOTESTOSTERONE CYPIONATE) 200 MG/ML injection, Inject 0.5 mLs (100 mg total)  into the muscle once a week., Disp: 10 mL, Rfl: 0 .  triamcinolone cream (KENALOG) 0.1 %, Apply 1 application topically 2 (two) times daily., Disp: 30 g, Rfl: 0 .  benzonatate (TESSALON) 200 MG capsule, Take 1 capsule (200 mg total) by mouth 3 (three) times daily as needed. (Patient not taking: Reported on 02/06/2020), Disp: 30 capsule, Rfl: 0 .  famotidine (PEPCID) 20 MG tablet, Take 1 tablet (20 mg total) by mouth 2 (two) times daily., Disp: 180 tablet, Rfl: 0  Review of Systems  Social History   Tobacco Use  . Smoking status: Light Tobacco Smoker    Years: 20.00    Types: E-cigarettes, Cigars  . Smokeless tobacco: Never Used  . Tobacco comment: " sometimes during the day and at night"  Substance Use Topics  . Alcohol use: Not Currently    Alcohol/week: 0.0 standard drinks    Comment: 12 pack a beer a night      Objective:   There were no vitals taken for this visit. There were no vitals filed for this visit.There is no height or weight on file to calculate BMI.   Physical Exam   No results found for any visits on 02/06/20.     Assessment & Plan    1. Anxiety  Patient noncompliant with psychiatry follow up. I have reached out to his psychiatrist about scheduling with potential alternate number. I will increase his vistaril. I have declined his request for klonopin as patient has a history of  alcohol abuse requiring rehabilitation. Counseled he likely needs to have his maintenance medications adjusted and needs to do this with his psychiatrist.   2. Bipolar 1 disorder, mixed, mild (HCC)  - hydrOXYzine (ATARAX/VISTARIL) 50 MG tablet; Take 1 tablet (50 mg total) by mouth 3 (three) times daily as needed.  Dispense: 30 tablet; Refill: 0 I discussed the assessment and treatment plan with the patient. The patient was provided an opportunity to ask questions and all were answered. The patient agreed with the plan and demonstrated an understanding of the instructions.   The patient was advised to call back or seek an in-person evaluation if the symptoms worsen or if the condition fails to improve as anticipated.  I provided 25 minutes of non-face-to-face time during this encounter.  The entirety of the information documented in the History of Present Illness, Review of Systems and Physical Exam were personally obtained by me. Portions of this information were initially documented by [facility] and reviewed by me for thoroughness and accuracy.      Trinna Post, PA-C  [facility]

## 2020-02-07 ENCOUNTER — Other Ambulatory Visit: Payer: 59

## 2020-02-07 ENCOUNTER — Other Ambulatory Visit: Payer: Self-pay

## 2020-02-07 DIAGNOSIS — N401 Enlarged prostate with lower urinary tract symptoms: Secondary | ICD-10-CM

## 2020-02-07 DIAGNOSIS — E291 Testicular hypofunction: Secondary | ICD-10-CM

## 2020-02-08 ENCOUNTER — Telehealth: Payer: Self-pay | Admitting: *Deleted

## 2020-02-08 ENCOUNTER — Other Ambulatory Visit: Payer: Self-pay | Admitting: Urology

## 2020-02-08 ENCOUNTER — Ambulatory Visit: Payer: 59 | Admitting: Psychiatry

## 2020-02-08 LAB — TESTOSTERONE: Testosterone: 666 ng/dL (ref 264–916)

## 2020-02-08 LAB — HEMATOCRIT: Hematocrit: 49 % (ref 37.5–51.0)

## 2020-02-08 LAB — PSA: Prostate Specific Ag, Serum: 2.2 ng/mL (ref 0.0–4.0)

## 2020-02-08 MED ORDER — TESTOSTERONE CYPIONATE 200 MG/ML IM SOLN: 100.0000 mg | INTRAMUSCULAR | 0 refills | Status: DC

## 2020-02-08 NOTE — Telephone Encounter (Signed)
Patient called back. Results were given. Patient states pharmacy will not refill medications until we call and verify.

## 2020-02-08 NOTE — Telephone Encounter (Signed)
-----   Message from Abbie Sons, MD sent at 02/08/2020  8:22 AM EST ----- Testosterone level looks good at 666.  Hematocrit normal at 49 and PSA stable at 2.2.  Would continue 100 mg weekly

## 2020-02-08 NOTE — Telephone Encounter (Signed)
Patient called requesting refill on Testosterone sent to [name]

## 2020-02-19 ENCOUNTER — Ambulatory Visit (INDEPENDENT_AMBULATORY_CARE_PROVIDER_SITE_OTHER): Payer: 59 | Admitting: Psychiatry

## 2020-02-19 ENCOUNTER — Encounter: Payer: Self-pay | Admitting: Psychiatry

## 2020-02-19 ENCOUNTER — Other Ambulatory Visit: Payer: Self-pay

## 2020-02-19 DIAGNOSIS — Z9119 Patient's noncompliance with other medical treatment and regimen: Secondary | ICD-10-CM

## 2020-02-19 DIAGNOSIS — F1020 Alcohol dependence, uncomplicated: Secondary | ICD-10-CM | POA: Diagnosis not present

## 2020-02-19 DIAGNOSIS — F17200 Nicotine dependence, unspecified, uncomplicated: Secondary | ICD-10-CM

## 2020-02-19 DIAGNOSIS — F3160 Bipolar disorder, current episode mixed, unspecified: Secondary | ICD-10-CM | POA: Diagnosis not present

## 2020-02-19 DIAGNOSIS — F4310 Post-traumatic stress disorder, unspecified: Secondary | ICD-10-CM

## 2020-02-19 DIAGNOSIS — F410 Panic disorder [episodic paroxysmal anxiety] without agoraphobia: Secondary | ICD-10-CM

## 2020-02-19 DIAGNOSIS — Z91199 Patient's noncompliance with other medical treatment and regimen due to unspecified reason: Secondary | ICD-10-CM

## 2020-02-19 MED ORDER — DULOXETINE HCL 60 MG PO CPEP: 60.0000 mg | ORAL_CAPSULE | Freq: Every day | ORAL | 1 refills | Status: DC

## 2020-02-19 NOTE — Progress Notes (Signed)
Provider Location : [facility] Patient Location : Home  Virtual Visit via Video Note  I connected with Russell Simpson on 02/19/20 at  4:30 PM EDT by a video enabled telemedicine application and verified that I am speaking with the correct person using two identifiers.   I discussed the limitations of evaluation and management by telemedicine and the availability of in person appointments. The patient expressed understanding and agreed to proceed.   I discussed the assessment and treatment plan with the patient. The patient was provided an opportunity to ask questions and all were answered. The patient agreed with the plan and demonstrated an understanding of the instructions.   The patient was advised to call back or seek an in-person evaluation if the symptoms worsen or if the condition fails to improve as anticipated.    [facility] Holiday MD OP Progress Note  02/19/2020 5:48 PM Russell Simpson  MRN:  [id number]  Chief Complaint:  Chief Complaint    Follow-up     HPI: Russell Simpson is a 59 year old Caucasian male, divorced, on disability, lives in [place], has a history of bipolar disorder, PTSD, panic attacks, hypertension, prediabetic, multiple back surgery, history of pituitary adenoma, chronic pain was evaluated by telemedicine today.  Patient today reports he has been struggling with back-to-back panic attacks on a regular basis.  This has been going on since the past several weeks and getting worse.  He reports his panic symptoms as inability to breathe, and increased nervousness and agitation which can last for 30 minutes or so.  He takes hydroxyzine as prescribed by his primary care provider which helps to some extent.  Patient reports he cannot find a trigger that could be contributing to his panic symptoms.  It can happen anytime of the day without any triggers also.  Patient also reports sleep issues.  He reports he does wake up in between however is able to fall back asleep.  The Zyprexa does help to some  extent.  He did not have the sleep study yet however is waiting.  Patient denies any suicidality, homicidality or perceptual disturbances.  He continues to smoke cigarettes and has not been able to cut back much.  He has also started abusing alcohol and reports he drank 6 pack of alcohol last night.  Patient did go to a program at [facility] in the past, graduated in May 2020.  He also used to be in [employer] in the past.  He was sober for a while however he started drinking again.  Patient today reports he is agreeable to going to an IOP program for his alcoholism.  Patient today reports he may have been noncompliant with his Cymbalta and wonders whether that may have contributed to the panic attacks.  Patient denies any other concerns today. Visit Diagnosis:    ICD-10-CM   1. Mixed bipolar I disorder (HCC)  F31.60 DULoxetine (CYMBALTA) 60 MG capsule   mild   2. PTSD (post-traumatic stress disorder)  F43.10 DULoxetine (CYMBALTA) 60 MG capsule  3. Panic disorder  F41.0 DULoxetine (CYMBALTA) 60 MG capsule  4. Alcohol use disorder, severe, dependence ([place])  F10.20   5. Noncompliance with treatment regimen  Z91.19   6. Tobacco use disorder  F17.200     Past Psychiatric History: I have reviewed past psychiatric history from my progress note on 07/19/2019.  Past trials of Cymbalta, BuSpar, gabapentin, trazodone, melatonin-nightmares  Past Medical History:  Past Medical History:  Diagnosis Date  . 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 Arthritis   .  Asthma   . Brain tumor (benign) ([place])    benign pituitary neoplasm  . Chronic pain    right arm  . Depression   . Environmental and seasonal allergies   . Hypertension   . Pneumonia   . Sleep apnea    does not wear CPAP ; uses humidifier instead    Past Surgical History:  Procedure Laterality Date  . ANTERIOR CERVICAL DECOMP/DISCECTOMY FUSION N/A 06/17/2016   Procedure: ANTERIOR CERVICAL DECOMPRESSION FUSION, CERVICAL 3-4, CERVICAL 4-5 WITH  INSTRUMENTATION AND ALLOGRAFT;  Surgeon: Phylliss Bob, MD;  Location: [facility];  Service: Orthopedics;  Laterality: N/A;  ANTERIOR CERVICAL DECOMPRESSION FUSION, CERVICAL 3-4, CERVICAL 4-5 WITH INSTRUMENTATION AND ALLOGRAFT  . BACK SURGERY    . NECK SURGERY  2009    Family Psychiatric History: I have reviewed family psychiatric history from my progress note on 07/19/2019.  Family History:  Family History  Problem Relation Age of Onset  . Diabetes Mother   . Diabetes Other     Social History: I have reviewed social history from my progress note on 07/19/2019. Social History   Socioeconomic History  . Marital status: Divorced    Spouse name: Not on file  . Number of children: Not on file  . Years of education: Not on file  . Highest education level: Not on file  Occupational History  . Not on file  Tobacco Use  . Smoking status: Light Tobacco Smoker    Years: 20.00    Types: E-cigarettes, Cigars  . Smokeless tobacco: Never Used  . Tobacco comment: " sometimes during the day and at night"  Substance and Sexual Activity  . Alcohol use: Not Currently    Alcohol/week: 0.0 standard drinks    Comment: 12 pack a beer a night  . Drug use: No  . Sexual activity: Not Currently  Other Topics Concern  . Not on file  Social History Narrative  . Not on file   Social Determinants of Health   Financial Resource Strain:   . Difficulty of Paying Living Expenses:   Food Insecurity:   . Worried About Charity fundraiser in the Last Year:   . Arboriculturist in the Last Year:   Transportation Needs:   . Film/video editor (Medical):   [name] Lack of Transportation (Non-Medical):   Physical Activity: Insufficiently Active  . Days of Exercise per Week: 1 day  . Minutes of Exercise per Session: 10 min  Stress: Stress Concern Present  . Feeling of Stress : Rather much  Social Connections: Moderately Isolated  . Frequency of Communication with Friends and Family: Twice a week  . Frequency  of Social Gatherings with Friends and Family: Once a week  . Attends Religious Services: Never  . Active Member of Clubs or Organizations: No  . Attends Archivist Meetings: Never  . Marital Status: Divorced    Allergies:  Allergies  Allergen Reactions  . Penicillins Anaphylaxis  . Tetanus Toxoids Swelling  . Lisinopril     Other reaction(s): Other (See Comments) Other reaction(s): Cough Other reaction(s): Cough Other reaction(s): Cough Other reaction(s): Cough  . Losartan     Other reaction(s): Other (See Comments) Other reaction(s): Muscle Pain Other reaction(s): Muscle Pain Other reaction(s): Muscle Pain  . Tetanus Toxoid   . Codeine Nausea Only  . Doxycycline Rash    Metabolic Disorder Labs: Lab Results  Component Value Date   HGBA1C 5.8 (A) 05/23/2019   Lab Results  Component Value  Date   PROLACTIN 1.3 (L) 12/02/2018   PROLACTIN <1.0 (L) 10/15/2017   Lab Results  Component Value Date   CHOL 255 (H) 02/14/2019   TRIG 155 (H) 02/14/2019   HDL 53 02/14/2019   CHOLHDL 4.8 02/14/2019   LDLCALC 171 (H) 02/14/2019   LDLCALC 108 (H) 10/15/2017   Lab Results  Component Value Date   TSH 3.800 02/14/2019   TSH 3.39 10/15/2017    Therapeutic Level Labs: No results found for: LITHIUM No results found for: VALPROATE No components found for:  CBMZ  Current Medications: Current Outpatient Medications  Medication Sig Dispense Refill  . albuterol (VENTOLIN HFA) 108 (90 Base) MCG/ACT inhaler INHALE 2 PUFFS BY MOUTH EVERY 6 HOURS ASNEEDED FOR WHEEZING 8.5 g 4  . amLODipine (NORVASC) 5 MG tablet Take 1 tablet (5 mg total) by mouth daily. 90 tablet 3  . Arginine 2000 MG PACK Take by mouth daily.    [name] atorvastatin (LIPITOR) 10 MG tablet Take 1 tablet (10 mg total) by mouth at bedtime. 90 tablet 0  . benzonatate (TESSALON) 200 MG capsule Take 1 capsule (200 mg total) by mouth 3 (three) times daily as needed. (Patient not taking: Reported on 02/06/2020) 30 capsule  0  . benztropine (COGENTIN) 0.5 MG tablet Take 1 tablet (0.5 mg total) by mouth daily as needed. For muscle cramps and side effects of olanzapine 90 tablet 0  . Carbonyl Iron (PERFECT IRON) 25 MG TABS Take by mouth daily.    . Coenzyme Q10 (COQ-10) 100 MG CAPS Take by mouth daily.    . colchicine 0.6 MG tablet Take 2 tablets (1.2mg ) by mouth at first sign of gout flare followed by 1 tablet (0.6mg ) after 1 hour. (Max 1.8mg  within 1 hour)    . Cyanocobalamin (B-12) 5000 MCG SUBL Place under the tongue daily.    . cyclobenzaprine (FLEXERIL) 10 MG tablet Take 1 tablet (10 mg total) by mouth 3 (three) times daily as needed for muscle spasms. 30 tablet 0  . cyclobenzaprine (FLEXERIL) 10 MG tablet Take 1 tablet (10 mg total) by mouth 3 (three) times daily as needed. 15 tablet 0  . DHEA 50 MG CAPS Take by mouth.    . DULoxetine (CYMBALTA) 60 MG capsule Take 1 capsule (60 mg total) by mouth daily. 30 capsule 1  . EUCRISA 2 % OINT Apply  a small amount to affected area   qd/bid to aa's chest, inframammary, eye    . famotidine (PEPCID) 20 MG tablet Take 1 tablet (20 mg total) by mouth 2 (two) times daily. 180 tablet 0  . finasteride (PROSCAR) 5 MG tablet Take 1 tablet (5 mg total) by mouth daily. 90 tablet 3  . fluticasone (FLONASE) 50 MCG/ACT nasal spray Place into the nose.    . gabapentin (NEURONTIN) 300 MG capsule TAKE 1 CAPSULE BY MOUTH TWICE DAILY 180 capsule 0  . Ginger, Zingiber officinalis, (GINGER PO) Take by mouth.    . Glucosamine 500 MG CAPS Take by mouth daily.    . hydrOXYzine (ATARAX/VISTARIL) 50 MG tablet Take 1 tablet (50 mg total) by mouth 3 (three) times daily as needed. 30 tablet 0  . hydrOXYzine (VISTARIL) 25 MG capsule TAKE 1 CAPSULE BY MOUTH 4 TIMES DAILY ASNEEDED 120 capsule 0  . indomethacin (INDOCIN) 50 MG capsule TAKE 1 CAPSULE BY MOUTH 3 TIMES PER DAY WITH MEALS 90 capsule 5  . ketorolac (TORADOL) 10 MG tablet Take 1 tablet (10 mg total) by mouth every 6 (six)  hours as needed.  20 tablet 0  . lidocaine (LIDODERM) 5 % Place 1 patch onto the skin every 12 (twelve) hours. Remove & Discard patch within 12 hours or as directed by MD 10 patch 0  . loratadine (CLARITIN) 10 MG tablet Take 10 mg by mouth daily.     . magnesium oxide (MAG-OX) 400 MG tablet Take 400 mg by mouth daily.    . meloxicam (MOBIC) 15 MG tablet Take 1 tablet (15 mg total) by mouth daily. 30 tablet 0  . methocarbamol (ROBAXIN) 500 MG tablet Take 1 tablet (500 mg total) by mouth every 8 (eight) hours as needed for muscle spasms. 30 tablet 0  . metoprolol succinate (TOPROL-XL) 25 MG 24 hr tablet TAKE 1 TABLET BY MOUTH ONCE DAILY 30 tablet 3  . montelukast (SINGULAIR) 10 MG tablet Take 1 tablet (10 mg total) by mouth at bedtime. 30 tablet 5  . naproxen sodium (ANAPROX) 220 MG tablet Take 220 mg by mouth 2 (two) times daily with a meal.    . OLANZapine (ZYPREXA) 10 MG tablet Take 1 tablet (10 mg total) by mouth at bedtime. 90 tablet 0  . Omega-3 Fatty Acids (FISH OIL) 1000 MG CAPS Take by mouth.    [name] omeprazole (PRILOSEC) 20 MG capsule TAKE 1 CAPSULE BY MOUTH TWICE DAILY BEFORE MEALS 60 capsule 2  . OVER THE COUNTER MEDICATION OREGA MAX    . OVER THE COUNTER MEDICATION RED MARINE ALGAE-375 MG.    . sildenafil (REVATIO) 20 MG tablet 3-5 tablets daily as needed for erectile dysfunction 30 tablet 3  . tacrolimus (PROTOPIC) 0.1 % ointment Apply 1 application topically 2 (two) times daily.    . tamsulosin (FLOMAX) 0.4 MG CAPS capsule Take 1 capsule (0.4 mg total) by mouth daily. 90 capsule 3  . testosterone cypionate (DEPOTESTOSTERONE CYPIONATE) 200 MG/ML injection Inject 0.5 mLs (100 mg total) into the muscle once a week. 10 mL 0  . triamcinolone cream (KENALOG) 0.1 % Apply 1 application topically 2 (two) times daily. 30 g 0   No current facility-administered medications for this visit.     Musculoskeletal: Strength & Muscle Tone: UTA Gait & Station: normal Patient leans: N/A  Psychiatric Specialty  Exam: Review of Systems  Psychiatric/Behavioral: Positive for sleep disturbance. The patient is nervous/anxious.   All other systems reviewed and are negative.   There were no vitals taken for this visit.There is no height or weight on file to calculate BMI.  General Appearance: Casual  Eye Contact:  Fair  Speech:  Clear and Coherent  Volume:  Normal  Mood:  Anxious  Affect:  Congruent  Thought Process:  Goal Directed and Descriptions of Associations: Intact  Orientation:  Full (Time, Place, and Person)  Thought Content: Logical   Suicidal Thoughts:  No  Homicidal Thoughts:  No  Memory:  Immediate;   Fair Recent;   Fair Remote;   Fair  Judgement:  Fair  Insight:  Fair  Psychomotor Activity:  Normal  Concentration:  Concentration: Fair and Attention Span: Fair  Recall:  [employer] of Knowledge: Fair  Language: Fair  Akathisia:  No  Handed:  Right  AIMS (if indicated): UTA  Assets:  Communication Skills Desire for Improvement Housing Social Support  ADL's:  Intact  Cognition: WNL  Sleep:  Restless   Screenings: PHQ2-9     Chronic Care Management from 03/27/2019 in [facility] Visit from 02/14/2019 in [facility] Visit from 12/29/2018 in [facility]  Family Practice Office Visit from 09/01/2017 in [facility]  PHQ-2 Total Score  2  5  0  4  PHQ-9 Total Score  10  18  --  21       Assessment and Plan: Russell Simpson is a 60 year old Caucasian male on disability, lives in [place], divorced, has a history of multiple medical problems including bipolar disorder, PTSD, panic attacks, alcoholism, chronic pain, hypertension was evaluated by telemedicine today.  Patient is biologically predisposed given his family history, history of trauma and multiple medical problems.  Patient with psychosocial stressors of break-up with his girlfriend, financial problems and the pandemic relapsed on alcohol and has been noncompliant with his  Cymbalta.  Patient also has been noncompliant with his follow-up appointments with writer and the last time was seen on 11/08/2019 and was advised at that time to return in a month and never followed through with recommendation.  Plan as noted below.  Plan Bipolar disorder-improving Olanzapine 10 mg p.o. nightly Gabapentin 300 mg p.o. twice daily Continue Cogentin 0.5 mg as needed for muscle cramps.  PTSD-unstable Continue psychotherapy sessions with Ms. Alden Hipp, patient has been noncompliant. Restart Cymbalta, he has been noncompliant.  Panic attacks-unstable Restart Cymbalta 60 mg p.o. daily Restart psychotherapy sessions. Continue hydroxyzine as needed, advised him to limit use-25 to 50 mg twice a day as needed.  Insomnia-restless Patient is awaiting sleep study, he will reach out to his provider.  Alcohol use disorder severe-unstable Patient relapsed again on alcohol. Will refer him for IOP program.   Tobacco use disorder-unstable Provided smoking cessation counseling.  Noncompliance with treatment plan-encouraged compliance.  Follow-up in clinic in 3 weeks or sooner if needed.  I have spent atleast 30 minutes non face to face with patient today. More than 50 % of the time was spent for preparing to see the patient ( e.g., review of test, records ), obtaining and to review and separately obtained history , ordering medications and test ,psychoeducation and supportive psychotherapy and care coordination,as well as documenting clinical information in electronic health record,interpreting results of test and communication of results This note was generated in part or whole with voice recognition software. Voice recognition is usually quite accurate but there are transcription errors that can and very often do occur. I apologize for any typographical errors that were not detected and corrected.       Ursula Alert, MD 02/19/2020, 5:48 PM

## 2020-02-20 ENCOUNTER — Telehealth: Payer: Self-pay | Admitting: Psychiatry

## 2020-02-20 ENCOUNTER — Telehealth (HOSPITAL_COMMUNITY): Payer: Self-pay | Admitting: Psychiatry

## 2020-02-20 NOTE — Telephone Encounter (Signed)
Attempted to contact patient several times however each time writer calls it goes straight into voicemail.  Left voicemail with patient that Velva Harman from IOP was able to get in touch with writer and informed that patient had declined CD IOP.  Left message for patient that since he is struggling with alcoholism would recommend substance abuse treatment program and if he wants to go back to [facility] with whom he had substance abuse treatment before would recommend that.  Discussed clinic policy that he can be dismissed if he is not following recommendations.  Patient abusing 12 pack of alcohol per day, noncompliant with appointments as well as medications, currently struggling with anxiety attacks, will recommend substance abuse treatment program.  I have left a message with patient to call us back since each time we try to communicate with the patient it always goes into voicemail straight.

## 2020-02-20 NOTE — Telephone Encounter (Signed)
D:  Dr. Shea Evans referred pt to MH-IOP/CD-IOP.  Due to the amount of ETOH consumed daily; pt will need CD-IOP.  A:  Placed call to discuss CD-IOP with pt.  Pt is declining stating that he can't travel to this facility that many days during the week.  Discussed other options with pt.  Informed Dr. Shea Evans.

## 2020-02-26 ENCOUNTER — Inpatient Hospital Stay: Payer: 59 | Attending: Oncology

## 2020-02-26 ENCOUNTER — Ambulatory Visit: Admission: RE | Admit: 2020-02-26 | Payer: 59 | Source: Ambulatory Visit

## 2020-02-28 ENCOUNTER — Other Ambulatory Visit: Payer: Self-pay | Admitting: Physician Assistant

## 2020-03-01 ENCOUNTER — Other Ambulatory Visit: Payer: Self-pay | Admitting: Physician Assistant

## 2020-03-01 DIAGNOSIS — F3161 Bipolar disorder, current episode mixed, mild: Secondary | ICD-10-CM

## 2020-03-01 NOTE — Telephone Encounter (Signed)
Copied from [facility] [id number]. Topic: Quick Communication - Rx Refill/Question >> Mar 01, 2020  3:33 PM Mcneil, Ja-Kwan wrote: Medication: hydrOXYzine (ATARAX/VISTARIL) 50 MG tablet  Has the patient contacted their pharmacy? yes   Preferred Pharmacy (with phone number or street name): [facility], [place]. Phone: [phone]  Fax: [phone]  Agent: Please be advised that RX refills may take up to 3 business days. We ask that you follow-up with your pharmacy.

## 2020-03-04 ENCOUNTER — Inpatient Hospital Stay: Payer: 59 | Admitting: Oncology

## 2020-03-04 ENCOUNTER — Encounter: Payer: Self-pay | Admitting: Oncology

## 2020-03-04 ENCOUNTER — Telehealth: Payer: Self-pay | Admitting: Oncology

## 2020-03-04 NOTE — Telephone Encounter (Signed)
Patient did not attend CT on 02-26-20. MD stated that patient needs to complete CT prior to appt with MD. Konrad Dolores and CMA have attempted several times on this date to reach patient to reschedule CT and MD appt, but have not been able to reach patient. Voicemails requesting patient phone [facility] to reschedule appts have been left. Letter also sent to patient requesting patient phone [facility] to reschedule.

## 2020-03-12 ENCOUNTER — Other Ambulatory Visit: Payer: Self-pay

## 2020-03-12 ENCOUNTER — Ambulatory Visit (INDEPENDENT_AMBULATORY_CARE_PROVIDER_SITE_OTHER): Payer: 59 | Admitting: Family Medicine

## 2020-03-12 ENCOUNTER — Encounter: Payer: Self-pay | Admitting: Family Medicine

## 2020-03-12 DIAGNOSIS — R05 Cough: Secondary | ICD-10-CM | POA: Diagnosis not present

## 2020-03-12 DIAGNOSIS — R059 Cough, unspecified: Secondary | ICD-10-CM

## 2020-03-12 DIAGNOSIS — R5381 Other malaise: Secondary | ICD-10-CM

## 2020-03-12 DIAGNOSIS — R5383 Other fatigue: Secondary | ICD-10-CM

## 2020-03-12 DIAGNOSIS — R0600 Dyspnea, unspecified: Secondary | ICD-10-CM

## 2020-03-12 DIAGNOSIS — F3161 Bipolar disorder, current episode mixed, mild: Secondary | ICD-10-CM | POA: Diagnosis not present

## 2020-03-12 DIAGNOSIS — F410 Panic disorder [episodic paroxysmal anxiety] without agoraphobia: Secondary | ICD-10-CM

## 2020-03-12 MED ORDER — HYDROXYZINE HCL 50 MG PO TABS: 50.0000 mg | ORAL_TABLET | Freq: Three times a day (TID) | ORAL | 0 refills | Status: DC | PRN

## 2020-03-12 NOTE — Progress Notes (Signed)
Virtual telephone visit   I,Elena D DeSanto,acting as a scribe for [employer], [employer].,have documented all relevant documentation on the behalf of [employer],as directed by  [employer] while in the presence of [employer], [place].      Virtual Visit via Telephone Note   This visit type was conducted due to national recommendations for restrictions regarding the COVID-19 Pandemic (e.g. social distancing) in an effort to limit this patient's exposure and mitigate transmission in our community. Due to his co-morbid illnesses, this patient is at least at moderate risk for complications without adequate follow up. This format is felt to be most appropriate for this patient at this time. The patient did not have access to video technology or had technical difficulties with video requiring transitioning to audio format only (telephone). Physical exam was limited to content and character of the telephone converstion.    Patient location: home Provider location: office    Patient: Russell Simpson   DOB: 07-29-1961   59 y.o. Male  MRN: [id number] Visit Date: 03/12/2020  Today's Provider: Vernie Murders, PA  Subjective:    Chief Complaint  Patient presents with  . Anxiety   HPI   Patient came to the office for an appointment to be evaluated for his anxiety and panic attacks.  He was found to have positive symptoms that could be consistent with Covid-19.  The patient was instructed to go home and the office would call him to do a phone visit.   When patient was called he was unable to do the visit at that time because he was at the bank.   Patient was advised that if he was experiencing symptoms that could be consistent with Covid-19 he should go home and not be out in the community.  He verbalized understanding and asked to be called back in 10-15 minutes for his visit.    Anxiety: The patient complains of having increased anxiety and panic attacks.  He states it has been  going on for several weeks now. He complains of chest tightness, sleep disturbance, panic attacks and some thoughts of suicide.  He states he does not have a plan and would never actually kill himself.   He reports that his father recently had a stroke and he had to put his dog of many years to sleep.   He was in touch with his psych provider and was given Hydroxazine at a higher dose.  He states he tried calling her to let her know he is not any better but gets a recording saying the call can not go through.    His symptoms are complicated by some respiratory symptoms.  He states he has been having cough, that is productive and sometimes bloody.  He feels he has an elephant on his chest.  He does not think he has had any fever and no real congestion in his head just in the chest.   Patient Active Problem List   Diagnosis Date Noted  . Noncompliance with treatment regimen 02/19/2020  . Erectile dysfunction due to arterial insufficiency 08/10/2019  . Mixed bipolar I disorder ([place]) 07/19/2019  . PTSD (post-traumatic stress disorder) 07/19/2019  . Panic disorder 07/19/2019  . Tobacco use disorder 07/19/2019  . Moderate episode of recurrent major depressive disorder ([place]) 05/25/2019  . S/P lumbar fusion 02/22/2019  . Alcohol use disorder, severe, in early remission ([place]) 01/01/2019  . Disorder of bursae of shoulder region 07/21/2017  . Full thickness rotator cuff  tear 07/21/2017  . Localized, primary osteoarthritis 07/21/2017  . 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 Osteoarthritis of knee 07/21/2017  . Anxiety 01/10/2016  . BP (high blood pressure) 01/10/2016  . Apnea, sleep 01/10/2016  . Hyperlipidemia 10/15/2015  . Pituitary adenoma ([place]) 10/15/2015  . Hypogonadism in male 10/15/2015  . Depression 10/15/2015  . 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 Allergic rhinitis 10/15/2015  . Incomplete bladder emptying 05/30/2015  . Hernia, inguinal, left 09/21/2014  . Benign prostatic  hyperplasia with lower urinary tract symptoms 10/24/2012  . 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 OSTEOMYELITIS, ACUTE, OTHER [facility] SITE 06/09/2006   Past Medical History:  Diagnosis Date  . 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 Arthritis   . Asthma   . Brain tumor (benign) ([place])    benign pituitary neoplasm  . Chronic pain    right arm  . Depression   . Environmental and seasonal allergies   . Hypertension   . Pneumonia   . Sleep apnea    does not wear CPAP ; uses humidifier instead   Social History   Tobacco Use  . Smoking status: Light Tobacco Smoker    Years: 20.00    Types: E-cigarettes, Cigars  . Smokeless tobacco: Never Used  . Tobacco comment: " sometimes during the day and at night"  Substance Use Topics  . Alcohol use: Not Currently    Alcohol/week: 0.0 standard drinks    Comment: 12 pack a beer a night  . Drug use: No   Family History  Problem Relation Age of Onset  . Diabetes Mother   . Diabetes Other    Allergies  Allergen Reactions  . Penicillins Anaphylaxis  . Tetanus Toxoids Swelling  . Lisinopril     Other reaction(s): Other (See Comments) Other reaction(s): Cough Other reaction(s): Cough Other reaction(s): Cough Other reaction(s): Cough  . Losartan     Other reaction(s): Other (See Comments) Other reaction(s): Muscle Pain Other reaction(s): Muscle Pain Other reaction(s): Muscle Pain  . Tetanus Toxoid   . Codeine Nausea Only  . Doxycycline Rash      Medications: Outpatient Medications Prior to Visit  Medication Sig  . albuterol (VENTOLIN HFA) 108 (90 Base) MCG/ACT inhaler INHALE 2 PUFFS BY MOUTH EVERY 6 HOURS ASNEEDED FOR WHEEZING  . amLODipine (NORVASC) 5 MG tablet Take 1 tablet (5 mg total) by mouth daily.  . Arginine 2000 MG PACK Take by mouth daily.  [name] atorvastatin (LIPITOR) 10 MG tablet Take 1 tablet (10 mg total) by mouth at bedtime.  . benzonatate (TESSALON) 200 MG capsule Take 1 capsule (200 mg total) by mouth 3  (three) times daily as needed. (Patient not taking: Reported on 02/06/2020)  . benztropine (COGENTIN) 0.5 MG tablet Take 1 tablet (0.5 mg total) by mouth daily as needed. For muscle cramps and side effects of olanzapine  . Carbonyl Iron (PERFECT IRON) 25 MG TABS Take by mouth daily.  . Coenzyme Q10 (COQ-10) 100 MG CAPS Take by mouth daily.  . colchicine 0.6 MG tablet Take 2 tablets (1.2mg ) by mouth at first sign of gout flare followed by 1 tablet (0.6mg ) after 1 hour. (Max 1.8mg  within 1 hour)  . Cyanocobalamin (B-12) 5000 MCG SUBL Place under the tongue daily.  . cyclobenzaprine (FLEXERIL) 10 MG tablet Take 1 tablet (10 mg total) by mouth 3 (three) times daily as needed for muscle spasms.  . cyclobenzaprine (FLEXERIL) 10 MG tablet Take 1 tablet (10 mg total) by mouth 3 (three) times  daily as needed.  [name] DHEA 50 MG CAPS Take by mouth.  . DULoxetine (CYMBALTA) 60 MG capsule Take 1 capsule (60 mg total) by mouth daily.  . EUCRISA 2 % OINT Apply  a small amount to affected area   qd/bid to aa's chest, inframammary, eye  . famotidine (PEPCID) 20 MG tablet Take 1 tablet (20 mg total) by mouth 2 (two) times daily.  . finasteride (PROSCAR) 5 MG tablet Take 1 tablet (5 mg total) by mouth daily.  . fluticasone (FLONASE) 50 MCG/ACT nasal spray Place into the nose.  . gabapentin (NEURONTIN) 300 MG capsule TAKE 1 CAPSULE BY MOUTH TWICE DAILY  . Ginger, Zingiber officinalis, (GINGER PO) Take by mouth.  . Glucosamine 500 MG CAPS Take by mouth daily.  . hydrOXYzine (ATARAX/VISTARIL) 50 MG tablet TAKE 1 TABLET BY MOUTH 3 TIMES DAILY AS NEEDED  . hydrOXYzine (VISTARIL) 25 MG capsule TAKE 1 CAPSULE BY MOUTH 4 TIMES DAILY ASNEEDED  . indomethacin (INDOCIN) 50 MG capsule TAKE 1 CAPSULE BY MOUTH 3 TIMES PER DAY WITH MEALS  . ketorolac (TORADOL) 10 MG tablet Take 1 tablet (10 mg total) by mouth every 6 (six) hours as needed.  . lidocaine (LIDODERM) 5 % Place 1 patch onto the skin every 12 (twelve) hours. Remove &  Discard patch within 12 hours or as directed by MD  . loratadine (CLARITIN) 10 MG tablet Take 10 mg by mouth daily.   . magnesium oxide (MAG-OX) 400 MG tablet Take 400 mg by mouth daily.  . meloxicam (MOBIC) 15 MG tablet Take 1 tablet (15 mg total) by mouth daily.  . methocarbamol (ROBAXIN) 500 MG tablet Take 1 tablet (500 mg total) by mouth every 8 (eight) hours as needed for muscle spasms.  . metoprolol succinate (TOPROL-XL) 25 MG 24 hr tablet TAKE 1 TABLET BY MOUTH ONCE DAILY  . montelukast (SINGULAIR) 10 MG tablet Take 1 tablet (10 mg total) by mouth at bedtime.  . naproxen sodium (ANAPROX) 220 MG tablet Take 220 mg by mouth 2 (two) times daily with a meal.  . OLANZapine (ZYPREXA) 10 MG tablet Take 1 tablet (10 mg total) by mouth at bedtime.  . Omega-3 Fatty Acids (FISH OIL) 1000 MG CAPS Take by mouth.  [name] omeprazole (PRILOSEC) 20 MG capsule TAKE 1 CAPSULE BY MOUTH TWICE DAILY BEFORE MEALS  . OVER THE COUNTER MEDICATION OREGA MAX  . OVER THE COUNTER MEDICATION RED MARINE ALGAE-375 MG.  . sildenafil (REVATIO) 20 MG tablet 3-5 tablets daily as needed for erectile dysfunction  . tacrolimus (PROTOPIC) 0.1 % ointment Apply 1 application topically 2 (two) times daily.  . tamsulosin (FLOMAX) 0.4 MG CAPS capsule Take 1 capsule (0.4 mg total) by mouth daily.  [name] testosterone cypionate (DEPOTESTOSTERONE CYPIONATE) 200 MG/ML injection Inject 0.5 mLs (100 mg total) into the muscle once a week.  . triamcinolone cream (KENALOG) 0.1 % Apply 1 application topically 2 (two) times daily.   No facility-administered medications prior to visit.    Review of Systems  HENT: Positive for congestion, postnasal drip, sinus pressure, sinus pain and sore throat. Negative for ear pain.   Respiratory: Positive for chest tightness, shortness of breath and wheezing. Cough: sometimes bloody phlegm.   Cardiovascular: Positive for chest pain.  Neurological: Positive for dizziness. Negative for headaches.    Psychiatric/Behavioral: Positive for sleep disturbance and suicidal ideas (thoughts but no plan). The patient is nervous/anxious.        Increased depression        Objective:  There were no vitals taken for this visit.  Physical: Cough and "seal bark" with slight dyspnea during telephonic interview. Apparent flare of anxiety/panic.     Assessment & Plan:    1. Cough Onset over the past week with a hoarse cough, some intermittent blood streaked sputum and slight dyspnea. Thinks the majority of his symptoms may be from his significant anxiety/panic disorder with bipolar I disorder. Does not know if he has had any fever but sense of taste is diminished and having chest discomfort and generalized malaise with fatigue. With questionable COVID-10 symptoms, recommend he get testing at a walk-in clinic or go to the ER if dyspnea and severe panic progresses. Increase fluid intake and may use Tylenol or Advil for aches and pains. Must quarantine at home and use mask to protect father who had a stroke (who he lives with). Wash hands frequently and may use Mucinex-DM prn.  2. Malaise and fatigue Developed general malaise and fatigue in the past week with the cough. Some flare of allergies with high pollen counts recently. No fever documented but sense of taste is diminished. Recommend he get COVID-19 test at one of the walk-in clinics or go to the ER if dyspnea progresses. Should quarantine himself at home and use mask to protect family (lives with father).  3. Panic disorder Additional anxiety and panic since father had a stroke 1 weeks ago. He is doing better and back home without much residual effects. Patient has had some relief from the increase in Hydroxyzine to 50 mg TID and plans a follow up virtual visit with his psychiatrist on 03-18-20. Requests refill of the Hydroxyzine and will discuss need for regular use with Dr. Shea Evans versus prn only use. - hydrOXYzine (ATARAX/VISTARIL) 50 MG tablet;  Take 1 tablet (50 mg total) by mouth 3 (three) times daily as needed.  Dispense: 30 tablet; Refill: 0  4. Bipolar 1 disorder, mixed, mild (HCC) Followed by Dr. Shea Evans (psychiatrist) and treated with Duloxetine 60 mg qd, Cogentin 0.5 mg qd, Zyprexa 10 mg hs, Gabapentin 300 mg BID and Hydroxyzine 50 mg TID prn. Last follow up visit with Dr. Shea Evans was on 02-19-20 with a flare of anxiety/panic. Continues psychotherapy and regular follow up with psychiatrist.  - hydrOXYzine (ATARAX/VISTARIL) 50 MG tablet; Take 1 tablet (50 mg total) by mouth 3 (three) times daily as needed.  Dispense: 30 tablet; Refill: 0     I discussed the assessment and treatment plan with the patient. The patient was provided an opportunity to ask questions and all were answered. The patient agreed with the plan and demonstrated an understanding of the instructions.   The patient was advised to call back or seek an in-person evaluation if the symptoms worsen or if the condition fails to improve as anticipated.  I provided 15 minutes of non-face-to-face time during this encounter.  Andres Shad, PA, have reviewed all documentation for this visit. The documentation on 03/12/20 for the exam, diagnosis, procedures, and orders are all accurate and complete.  Vernie Murders, [place] [phone] (phone) [phone] (fax)  [place]

## 2020-03-13 ENCOUNTER — Encounter: Payer: Self-pay | Admitting: Emergency Medicine

## 2020-03-13 ENCOUNTER — Emergency Department: Payer: 59

## 2020-03-13 ENCOUNTER — Other Ambulatory Visit
Admission: RE | Admit: 2020-03-13 | Discharge: 2020-03-13 | Disposition: A | Discharge status: Home / Self Care | Payer: 59 | Source: Ambulatory Visit | Attending: Pediatrics | Admitting: Pediatrics

## 2020-03-13 ENCOUNTER — Emergency Department
Admission: EM | Admit: 2020-03-13 | Discharge: 2020-03-13 | Disposition: A | Discharge status: Home / Self Care | Payer: 59 | Attending: Emergency Medicine | Admitting: Emergency Medicine

## 2020-03-13 ENCOUNTER — Other Ambulatory Visit: Payer: Self-pay

## 2020-03-13 DIAGNOSIS — R079 Chest pain, unspecified: Secondary | ICD-10-CM | POA: Diagnosis not present

## 2020-03-13 DIAGNOSIS — Z5321 Procedure and treatment not carried out due to patient leaving prior to being seen by health care provider: Secondary | ICD-10-CM | POA: Diagnosis not present

## 2020-03-13 LAB — FIBRIN DERIVATIVES D-DIMER (ARMC ONLY): Fibrin derivatives D-dimer (ARMC): 1130.89 ng/mL (FEU) — ABNORMAL HIGH (ref 0.00–499.00)

## 2020-03-13 LAB — TROPONIN I (HIGH SENSITIVITY): Troponin I (High Sensitivity): 8 ng/L (ref <18)

## 2020-03-13 MED ORDER — IOHEXOL 350 MG/ML SOLN
75.0000 mL | Freq: Once | INTRAVENOUS | Status: AC | PRN
  Administered 2020-03-13: 75 mL via INTRAVENOUS

## 2020-03-13 NOTE — ED Triage Notes (Addendum)
Pt here for chest pain. Sent for elevated d dimer. Unlabored. Labs done and in care everywhere

## 2020-03-13 NOTE — ED Notes (Signed)
Pt updated and reassured after he called out wanting to go home. Pt agreed to stay. Warm blanket provided.

## 2020-03-13 NOTE — ED Notes (Signed)
Pt called out and reported to this nurse that he needed to go home. Pt states his dog is outside and his house unlocked. He wants to take his night time meds and needs to check on his dad who just had a stroke. Pt states he was informed that he was just coming to ED to get a CT scan and didn't realize he would have to wait so long. Pt asking to have his IV removed. This nurse explained the risks of leaving and pt verbalized understanding.

## 2020-03-13 NOTE — ED Triage Notes (Signed)
FIRST NURSE NOTE- here for r/o PE after elevated d dimer. Ambulatory without distress.

## 2020-03-14 ENCOUNTER — Telehealth: Payer: Self-pay | Admitting: Emergency Medicine

## 2020-03-14 NOTE — Telephone Encounter (Signed)
Called patient due to lwot to inquire about condition and follow up plans.  Asked him to call pcp and let them know he had ct done.

## 2020-03-18 ENCOUNTER — Other Ambulatory Visit: Payer: Self-pay

## 2020-03-18 ENCOUNTER — Telehealth (INDEPENDENT_AMBULATORY_CARE_PROVIDER_SITE_OTHER): Payer: 59 | Admitting: Psychiatry

## 2020-03-18 DIAGNOSIS — Z5329 Procedure and treatment not carried out because of patient's decision for other reasons: Secondary | ICD-10-CM

## 2020-03-18 DIAGNOSIS — Z91199 Patient's noncompliance with other medical treatment and regimen due to unspecified reason: Secondary | ICD-10-CM | POA: Insufficient documentation

## 2020-03-18 NOTE — Progress Notes (Signed)
No response to call or text. 

## 2020-03-19 ENCOUNTER — Other Ambulatory Visit: Payer: Self-pay | Admitting: Family Medicine

## 2020-03-19 ENCOUNTER — Other Ambulatory Visit: Payer: Self-pay | Admitting: Psychiatry

## 2020-03-19 DIAGNOSIS — F4310 Post-traumatic stress disorder, unspecified: Secondary | ICD-10-CM

## 2020-03-19 DIAGNOSIS — I10 Essential (primary) hypertension: Secondary | ICD-10-CM

## 2020-03-19 DIAGNOSIS — F3160 Bipolar disorder, current episode mixed, unspecified: Secondary | ICD-10-CM

## 2020-03-19 DIAGNOSIS — F410 Panic disorder [episodic paroxysmal anxiety] without agoraphobia: Secondary | ICD-10-CM

## 2020-03-19 NOTE — Telephone Encounter (Signed)
Requested Prescriptions  Pending Prescriptions Disp Refills  . metoprolol succinate (TOPROL-XL) 25 MG 24 hr tablet [Pharmacy Med Name: METOPROLOL SUCCINATE ER 25 MG TAB] 90 tablet 1    Sig: TAKE 1 TABLET BY MOUTH ONCE DAILY     Cardiovascular:  Beta Blockers Failed - 03/19/2020  4:55 PM      Failed - Last BP in normal range    BP Readings from Last 1 Encounters:  03/13/20 (!) 152/103         Passed - Last Heart Rate in normal range    Pulse Readings from Last 1 Encounters:  03/13/20 (!) 109         Passed - Valid encounter within last 6 months    Recent Outpatient Visits          1 week ago Cough   [facility], Vickki [place]uff, [place]   1 month ago [place], [place], [place]   3 months ago Lymphadenopathy   [facility], [place], [place]   3 months ago Cough   [place], [place]   4 months ago Acute low back pain without sciatica, unspecified back pain laterality   [facility], Vickki Muff, [place]      Future Appointments            In 5 months [place], Ronda [place], 

## 2020-03-21 ENCOUNTER — Telehealth (INDEPENDENT_AMBULATORY_CARE_PROVIDER_SITE_OTHER): Payer: 59 | Admitting: Psychiatry

## 2020-03-21 ENCOUNTER — Encounter: Payer: Self-pay | Admitting: Psychiatry

## 2020-03-21 ENCOUNTER — Other Ambulatory Visit: Payer: Self-pay

## 2020-03-21 DIAGNOSIS — Z9119 Patient's noncompliance with other medical treatment and regimen: Secondary | ICD-10-CM

## 2020-03-21 DIAGNOSIS — F4310 Post-traumatic stress disorder, unspecified: Secondary | ICD-10-CM | POA: Diagnosis not present

## 2020-03-21 DIAGNOSIS — F3161 Bipolar disorder, current episode mixed, mild: Secondary | ICD-10-CM | POA: Diagnosis not present

## 2020-03-21 DIAGNOSIS — F1020 Alcohol dependence, uncomplicated: Secondary | ICD-10-CM

## 2020-03-21 DIAGNOSIS — F410 Panic disorder [episodic paroxysmal anxiety] without agoraphobia: Secondary | ICD-10-CM

## 2020-03-21 DIAGNOSIS — Z79899 Other long term (current) drug therapy: Secondary | ICD-10-CM

## 2020-03-21 DIAGNOSIS — F17200 Nicotine dependence, unspecified, uncomplicated: Secondary | ICD-10-CM

## 2020-03-21 DIAGNOSIS — Z91199 Patient's noncompliance with other medical treatment and regimen due to unspecified reason: Secondary | ICD-10-CM

## 2020-03-21 MED ORDER — QUETIAPINE FUMARATE 100 MG PO TABS: 50.0000 mg | ORAL_TABLET | ORAL | 1 refills | Status: DC

## 2020-03-21 NOTE — Progress Notes (Signed)
Provider Location : [facility] Patient Location : Home  Virtual Visit via Telephone Note  I connected with Russell Simpson on 03/21/20 at  3:30 PM EDT by telephone and verified that I am speaking with the correct person using two identifiers.   I discussed the limitations, risks, security and privacy concerns of performing an evaluation and management service by telephone and the availability of in person appointments. I also discussed with the patient that there may be a patient responsible charge related to this service. The patient expressed understanding and agreed to proceed.      I discussed the assessment and treatment plan with the patient. The patient was provided an opportunity to ask questions and all were answered. The patient agreed with the plan and demonstrated an understanding of the instructions.   The patient was advised to call back or seek an in-person evaluation if the symptoms worsen or if the condition fails to improve as anticipated.   De Borgia MD OP Progress Note  03/21/2020 5:54 PM Russell Simpson  MRN:  [id number]  Chief Complaint:  Chief Complaint    Follow-up     HPI: Russell Simpson is a 59 year old Caucasian male, divorced, on disability, lives in [place], has a history of bipolar disorder, PTSD, panic attacks, alcohol use disorder severe dependence, prediabetic, multiple back surgeries, history of pituitary adenoma, chronic pain was evaluated by telemedicine today.  Patient's last appointment with writer was on 02/19/2020.  Patient at that time reported back-to-back panic attacks, inability to breathe when he has these panic symptoms increased nervousness and agitation which last for 30 minutes or so.  Patient however at that visit reported that he had not been compliant with his duloxetine which was prescribed for his anxiety.  Patient also reported abusing 12 pack of alcohol every single day on a regular basis.  At that visit patient was referred to intensive outpatient program  with [facility] for alcohol abuse treatment as well as for his panic symptoms.  Patient however did not follow through with recommendations.  Writer had left a message for patient after Ms.Clark  from the IOP program had reached out to Probation officer regarding patient being noncompliant.  Writer then advised patient to follow-up with [facility] intensive outpatient program which would be more affordable for him.  Patient however never returned writer's call and also no showed his appointment with writer on 03/18/2020.  Patient today returns for a follow-up visit.  Patient was offered a video call however he declined.  Patient reported that he could not connect through video and wanted to do the phone visit.  During her session patient appeared to have cough-like episodes, writer could only hear and could not evaluate him since this was a phone call.  Patient would have these cough-like spells and reported to writer that he was having a panic attack.  Patient reports he has been using hydroxyzine as prescribed which helps to some extent.  Patient reports he is currently overwhelmed with several psychosocial stressors including his dad who has health issues as well as break-up with his girlfriend.  Patient reports there has been times when he felt overwhelmed to the point when he felt he did not want to live anymore.  However on further exploration of these symptoms he denied any current suicidality or plan.  He reported that he loves his life and would never do anything to harm himself.  Patient denies any perceptual disturbances.  He reports sleep as restless.  Patient reports that he is motivated to  start treatment and would follow recommendations to get relief from his current anxiety symptoms.   Visit Diagnosis:    ICD-10-CM   1. Bipolar 1 disorder, mixed, mild (HCC)  F31.61   2. PTSD (post-traumatic stress disorder)  F43.10 QUEtiapine (SEROQUEL) 100 MG tablet  3. Panic disorder  F41.0 QUEtiapine (SEROQUEL)  100 MG tablet  4. Alcohol use disorder, severe, dependence ([place])  F10.20   5. Noncompliance with treatment regimen  Z91.19   6. Tobacco use disorder  F17.200   7. High risk medication use  Z79.899 Compliance Drug Analysis, Ur    CMP and Liver    CBC With Differential    TSH    Lipid panel    Hemoglobin A1C    Prolactin    Past Psychiatric History: I have reviewed past psychiatric history from my progress note on 07/19/2019.  Past trials of Cymbalta, BuSpar, gabapentin, trazodone, melatonin-nightmares, Zyprexa  Past Medical History:  Past Medical History:  Diagnosis Date  . 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)   . Arthritis   . Asthma   . Brain tumor (benign) ([place])    benign pituitary neoplasm  . Chronic pain    right arm  . Depression   . Environmental and seasonal allergies   . Hypertension   . Pneumonia   . Sleep apnea    does not wear CPAP ; uses humidifier instead    Past Surgical History:  Procedure Laterality Date  . ANTERIOR CERVICAL DECOMP/DISCECTOMY FUSION N/A 06/17/2016   Procedure: ANTERIOR CERVICAL DECOMPRESSION FUSION, CERVICAL 3-4, CERVICAL 4-5 WITH INSTRUMENTATION AND ALLOGRAFT;  Surgeon: Phylliss Bob, MD;  Location: [facility];  Service: Orthopedics;  Laterality: N/A;  ANTERIOR CERVICAL DECOMPRESSION FUSION, CERVICAL 3-4, CERVICAL 4-5 WITH INSTRUMENTATION AND ALLOGRAFT  . BACK SURGERY    . NECK SURGERY  2009    Family Psychiatric History: I have reviewed family psychiatric history from my progress note on 07/19/2019  Family History:  Family History  Problem Relation Age of Onset  . Diabetes Mother   . Diabetes Other     Social History: Reviewed social history from my progress note on 07/19/2019 Social History   Socioeconomic History  . Marital status: Divorced    Spouse name: Not on file  . Number of children: Not on file  . Years of education: Not on file  . Highest education level: Not on file  Occupational History  . Not on file  Tobacco Use  .  Smoking status: Light Tobacco Smoker    Years: 20.00    Types: E-cigarettes, Cigars  . Smokeless tobacco: Never Used  . Tobacco comment: " sometimes during the day and at night"  Substance and Sexual Activity  . Alcohol use: Not Currently    Alcohol/week: 0.0 standard drinks    Comment: 12 pack a beer a night  . Drug use: No  . Sexual activity: Not Currently  Other Topics Concern  . Not on file  Social History Narrative  . Not on file   Social Determinants of Health   Financial Resource Strain:   . Difficulty of Paying Living Expenses:   Food Insecurity:   . Worried About Charity fundraiser in the Last Year:   . Arboriculturist in the Last Year:   Transportation Needs:   . Film/video editor (Medical):   [name] Lack of Transportation (Non-Medical):   Physical Activity: Insufficiently Active  . Days of Exercise per Week: 1 day  . Minutes of Exercise per Session:  10 min  Stress: Stress Concern Present  . Feeling of Stress : Rather much  Social Connections: Moderately Isolated  . Frequency of Communication with Friends and Family: Twice a week  . Frequency of Social Gatherings with Friends and Family: Once a week  . Attends Religious Services: Never  . Active Member of Clubs or Organizations: No  . Attends Archivist Meetings: Never  . Marital Status: Divorced    Allergies:  Allergies  Allergen Reactions  . Penicillins Anaphylaxis  . Tetanus Toxoids Swelling  . Lisinopril     Other reaction(s): Other (See Comments) Other reaction(s): Cough Other reaction(s): Cough Other reaction(s): Cough Other reaction(s): Cough  . Losartan     Other reaction(s): Other (See Comments) Other reaction(s): Muscle Pain Other reaction(s): Muscle Pain Other reaction(s): Muscle Pain  . Tetanus Toxoid   . Codeine Nausea Only  . Doxycycline Rash    Metabolic Disorder Labs: Lab Results  Component Value Date   HGBA1C 5.8 (A) 05/23/2019   Lab Results  Component Value Date    PROLACTIN 1.3 (L) 12/02/2018   PROLACTIN <1.0 (L) 10/15/2017   Lab Results  Component Value Date   CHOL 255 (H) 02/14/2019   TRIG 155 (H) 02/14/2019   HDL 53 02/14/2019   CHOLHDL 4.8 02/14/2019   LDLCALC 171 (H) 02/14/2019   LDLCALC 108 (H) 10/15/2017   Lab Results  Component Value Date   TSH 3.800 02/14/2019   TSH 3.39 10/15/2017    Therapeutic Level Labs: No results found for: LITHIUM No results found for: VALPROATE No components found for:  CBMZ  Current Medications: Current Outpatient Medications  Medication Sig Dispense Refill  . albuterol (VENTOLIN HFA) 108 (90 Base) MCG/ACT inhaler INHALE 2 PUFFS BY MOUTH EVERY 6 HOURS ASNEEDED FOR WHEEZING 8.5 g 4  . amLODipine (NORVASC) 5 MG tablet Take 1 tablet (5 mg total) by mouth daily. 90 tablet 3  . Arginine 2000 MG PACK Take by mouth daily.    [name] atorvastatin (LIPITOR) 10 MG tablet Take 1 tablet (10 mg total) by mouth at bedtime. 90 tablet 0  . azithromycin (ZITHROMAX) 250 MG tablet Take 250 mg by mouth as directed.    . benzonatate (TESSALON) 200 MG capsule Take 1 capsule (200 mg total) by mouth 3 (three) times daily as needed. (Patient not taking: Reported on 02/06/2020) 30 capsule 0  . benztropine (COGENTIN) 0.5 MG tablet Take 1 tablet (0.5 mg total) by mouth daily as needed. For muscle cramps and side effects of olanzapine 90 tablet 0  . Carbonyl Iron (PERFECT IRON) 25 MG TABS Take by mouth daily.    . Coenzyme Q10 (COQ-10) 100 MG CAPS Take by mouth daily.    . colchicine 0.6 MG tablet Take 2 tablets (1.2mg ) by mouth at first sign of gout flare followed by 1 tablet (0.6mg ) after 1 hour. (Max 1.8mg  within 1 hour)    . Cyanocobalamin (B-12) 5000 MCG SUBL Place under the tongue daily.    . cyclobenzaprine (FLEXERIL) 10 MG tablet Take 1 tablet (10 mg total) by mouth 3 (three) times daily as needed. 15 tablet 0  . DHEA 50 MG CAPS Take by mouth.    . DULoxetine (CYMBALTA) 60 MG capsule Take 1 capsule (60 mg total) by mouth daily.  30 capsule 1  . EUCRISA 2 % OINT Apply  a small amount to affected area   qd/bid to aa's chest, inframammary, eye    . famotidine (PEPCID) 20 MG tablet Take 1 tablet (  20 mg total) by mouth 2 (two) times daily. 180 tablet 0  . finasteride (PROSCAR) 5 MG tablet Take 1 tablet (5 mg total) by mouth daily. 90 tablet 3  . fluticasone (FLONASE) 50 MCG/ACT nasal spray Place into the nose.    . gabapentin (NEURONTIN) 300 MG capsule TAKE 1 CAPSULE BY MOUTH TWICE DAILY 180 capsule 0  . Ginger, Zingiber officinalis, (GINGER PO) Take by mouth.    . Glucosamine 500 MG CAPS Take by mouth daily.    . hydrOXYzine (ATARAX/VISTARIL) 50 MG tablet Take 1 tablet (50 mg total) by mouth 3 (three) times daily as needed. 30 tablet 0  . indomethacin (INDOCIN) 50 MG capsule TAKE 1 CAPSULE BY MOUTH 3 TIMES PER DAY WITH MEALS 90 capsule 5  . ketorolac (TORADOL) 10 MG tablet Take 1 tablet (10 mg total) by mouth every 6 (six) hours as needed. 20 tablet 0  . lidocaine (LIDODERM) 5 % Place 1 patch onto the skin every 12 (twelve) hours. Remove & Discard patch within 12 hours or as directed by MD 10 patch 0  . loratadine (CLARITIN) 10 MG tablet Take 10 mg by mouth daily.     . magnesium oxide (MAG-OX) 400 MG tablet Take 400 mg by mouth daily.    . meloxicam (MOBIC) 15 MG tablet Take 1 tablet (15 mg total) by mouth daily. 30 tablet 0  . methocarbamol (ROBAXIN) 500 MG tablet Take 1 tablet (500 mg total) by mouth every 8 (eight) hours as needed for muscle spasms. 30 tablet 0  . metoprolol succinate (TOPROL-XL) 25 MG 24 hr tablet TAKE 1 TABLET BY MOUTH ONCE DAILY 90 tablet 1  . montelukast (SINGULAIR) 10 MG tablet Take 1 tablet (10 mg total) by mouth at bedtime. 30 tablet 5  . naproxen sodium (ANAPROX) 220 MG tablet Take 220 mg by mouth 2 (two) times daily with a meal.    . Omega-3 Fatty Acids (FISH OIL) 1000 MG CAPS Take by mouth.    [name] omeprazole (PRILOSEC) 20 MG capsule TAKE 1 CAPSULE BY MOUTH TWICE DAILY BEFORE MEALS 60 capsule 2   . OVER THE COUNTER MEDICATION OREGA MAX    . OVER THE COUNTER MEDICATION RED MARINE ALGAE-375 MG.    . predniSONE (DELTASONE) 20 MG tablet Take 20 mg by mouth daily.    . QUEtiapine (SEROQUEL) 100 MG tablet Take 0.5-1 tablets (50-100 mg total) by mouth as directed. Take half tablet ( 50 mg) daily as needed for severe anxiety/panic attacks and 1 tablet( 100 mg) at bedtime 45 tablet 1  . sildenafil (REVATIO) 20 MG tablet 3-5 tablets daily as needed for erectile dysfunction 30 tablet 3  . tacrolimus (PROTOPIC) 0.1 % ointment Apply 1 application topically 2 (two) times daily.    . tamsulosin (FLOMAX) 0.4 MG CAPS capsule Take 1 capsule (0.4 mg total) by mouth daily. 90 capsule 3  . testosterone cypionate (DEPOTESTOSTERONE CYPIONATE) 200 MG/ML injection Inject 0.5 mLs (100 mg total) into the muscle once a week. 10 mL 0  . triamcinolone cream (KENALOG) 0.1 % Apply 1 application topically 2 (two) times daily. 30 g 0   No current facility-administered medications for this visit.     Musculoskeletal: Strength & Muscle Tone: UTA Gait & Station: UTA Patient leans: N/A  Psychiatric Specialty Exam: Review of Systems  Constitutional: Positive for fatigue.  Respiratory: Positive for cough.   Psychiatric/Behavioral: Positive for sleep disturbance. The patient is nervous/anxious.   All other systems reviewed and are negative.  There were no vitals taken for this visit.There is no height or weight on file to calculate BMI.  General Appearance: UTA  Eye Contact:  UTA  Speech:  Clear and Coherent  Volume:  Normal  Mood:  Anxious  Affect:  UTA  Thought Process:  Goal Directed and Descriptions of Associations: Intact  Orientation:  Full (Time, Place, and Person)  Thought Content: Logical   Suicidal Thoughts:  No  Homicidal Thoughts:  No  Memory:  Immediate;   Fair Recent;   Fair Remote;   Fair  Judgement:  Impaired  Insight:  Shallow  Psychomotor Activity:  UTA  Concentration:  Concentration:  Fair and Attention Span: Fair  Recall:  [employer] of Knowledge: Fair  Language: Fair  Akathisia:  No  Handed:  Right  AIMS (if indicated):UTA  Assets:  Communication Skills Desire for Improvement Housing  ADL's:  Intact  Cognition: WNL  Sleep:  restless   Screenings: PHQ2-9     Chronic Care Management from 03/27/2019 in [facility] Visit from 02/14/2019 in [facility] Visit from 12/29/2018 in [facility] Visit from 09/01/2017 in [facility]  PHQ-2 Total Score  2  5  0  4  PHQ-9 Total Score  10  18  -  21       Assessment and Plan: Russell Simpson is a 59 year old Caucasian male on disability, lives in [place], divorced, has a history of multiple medical problems including bipolar disorder, PTSD, panic attacks, alcoholism, chronic pain, hypertension was evaluated by telemedicine today.  Patient is biologically predisposed given his family history, history of substance abuse problems, history of trauma, multiple health issues .  Patient with psychosocial stressors of break-up with his girlfriend, financial problems and the current pandemic.  Patient with noncompliance with recommendations and substance abuse problems will  benefit from more intensive programs like IOP/PHP as well as medication management.  Plan Bipolar disorder-unstable Discontinue olanzapine for lack of benefit. Start Seroquel 100 mg p.o. nightly. Continue gabapentin 300 mg p.o. twice daily  Panic attacks-unstable Continue Cymbalta 60 mg p.o. daily Continue hydroxyzine 25 mg as needed as prescribed Start Seroquel 50 mg p.o. daily as needed for severe panic attacks/anxiety attacks. Discussed with patient about IOP/PHP program which are more intensive.  Patient reports he will let writer know. Also discussed inpatient mental health admission if he continues to decompensate.  PTSD-unstable Patient will benefit from psychotherapy sessions and this  was discussed several times with patient.  He however has been noncompliant. Patient was scheduled with our therapist here in clinic in the past however he was noncompliant.  Insomnia-restless Patient is awaiting sleep study  Alcohol use disorder severe dependence-unstable Patient reports he quit using alcohol a few days ago.  However last visit he reported that he was using 12 packs of alcohol per day on a regular basis. Provided counseling.  Tobacco use disorder-unstable We will monitor closely  High risk medication use-will get the following labs, urine drug compliance analysis to check for compliance on medications as well as to screen for other drugs, prolactin, lipid panel, hemoglobin A1c, CMP, CBC with differential.  Noncompliance with treatment recommendation-patient has been noncompliant with all recommendations.  Patient no-shows his appointment and then calls the clinic back reporting worsening symptoms.  Patient however never follows up with any recommendations.  Discussed clinic policy with patient.  I have reviewed most recent progress note per his primary care provider-Mr.Dennis Chrismon PA.  Follow-up in clinic in  1 to 2 weeks or sooner if needed.  Crisis plan discussed with patient.  I have spent atleast 30 minutes non face to face with patient today. More than 50 % of the time was spent for preparing to see the patient ( e.g., review of test, records ), obtaining and to review and separately obtained history , ordering medications and test ,psychoeducation and supportive psychotherapy and care coordination,as well as documenting clinical information in electronic health record,interpreting results of test and communication of results This note was generated in part or whole with voice recognition software. Voice recognition is usually quite accurate but there are transcription errors that can and very often do occur. I apologize for any typographical errors that were not  detected and corrected.      Ursula Alert, MD 03/21/2020, 5:54 PM

## 2020-03-21 NOTE — Patient Instructions (Signed)
Follow-up in clinic on April 29 at 8:45 AM

## 2020-03-22 ENCOUNTER — Other Ambulatory Visit: Payer: Self-pay | Admitting: Family Medicine

## 2020-03-22 DIAGNOSIS — F410 Panic disorder [episodic paroxysmal anxiety] without agoraphobia: Secondary | ICD-10-CM

## 2020-03-22 DIAGNOSIS — F3161 Bipolar disorder, current episode mixed, mild: Secondary | ICD-10-CM

## 2020-03-22 NOTE — Telephone Encounter (Signed)
Requested Prescriptions  Pending Prescriptions Disp Refills  . hydrOXYzine (ATARAX/VISTARIL) 50 MG tablet [Pharmacy Med Name: HYDROXYZINE HCL 50 MG TAB] 30 tablet 0    Sig: TAKE 1 TABLET BY MOUTH 3 TIMES DAILY AS NEEDED     Ear, Nose, and Throat:  Antihistamines Passed - 03/22/2020  5:21 PM      Passed - Valid encounter within last 12 months    Recent Outpatient Visits          1 week ago Cough   [place], Vickki [place]uff, [place]   1 month ago [place], [place], [place]   3 months ago Lymphadenopathy   [place], [place], [place]   4 months ago Cough   [place], [place]   5 months ago Acute low back pain without sciatica, unspecified back pain laterality   [place], Vickki Muff, [place]      Future Appointments            In 5 months [place], Ronda Fairly, MD [place]

## 2020-03-25 ENCOUNTER — Telehealth: Payer: Self-pay

## 2020-03-25 NOTE — Telephone Encounter (Signed)
labwork mailed

## 2020-03-26 ENCOUNTER — Other Ambulatory Visit: Payer: Self-pay | Admitting: Family Medicine

## 2020-03-26 ENCOUNTER — Other Ambulatory Visit: Payer: Self-pay | Admitting: Physician Assistant

## 2020-03-26 DIAGNOSIS — F410 Panic disorder [episodic paroxysmal anxiety] without agoraphobia: Secondary | ICD-10-CM

## 2020-03-26 DIAGNOSIS — F3161 Bipolar disorder, current episode mixed, mild: Secondary | ICD-10-CM

## 2020-03-28 ENCOUNTER — Telehealth (INDEPENDENT_AMBULATORY_CARE_PROVIDER_SITE_OTHER): Payer: 59 | Admitting: Psychiatry

## 2020-03-28 ENCOUNTER — Other Ambulatory Visit: Payer: Self-pay

## 2020-03-28 DIAGNOSIS — Z5329 Procedure and treatment not carried out because of patient's decision for other reasons: Secondary | ICD-10-CM

## 2020-03-28 DIAGNOSIS — Z9119 Patient's noncompliance with other medical treatment and regimen: Secondary | ICD-10-CM

## 2020-03-28 NOTE — Progress Notes (Signed)
No response to call,texted link, attempted my chart video connection. Patient noncompliant with follow ups and treatment recommendations.

## 2020-04-01 ENCOUNTER — Other Ambulatory Visit: Payer: Self-pay | Admitting: Physician Assistant

## 2020-04-01 ENCOUNTER — Other Ambulatory Visit: Payer: Self-pay | Admitting: Psychiatry

## 2020-04-01 DIAGNOSIS — F3160 Bipolar disorder, current episode mixed, unspecified: Secondary | ICD-10-CM

## 2020-04-01 DIAGNOSIS — F4310 Post-traumatic stress disorder, unspecified: Secondary | ICD-10-CM

## 2020-04-01 DIAGNOSIS — F410 Panic disorder [episodic paroxysmal anxiety] without agoraphobia: Secondary | ICD-10-CM

## 2020-04-02 ENCOUNTER — Other Ambulatory Visit: Payer: Self-pay | Admitting: Psychiatry

## 2020-04-02 ENCOUNTER — Telehealth: Payer: Self-pay

## 2020-04-02 DIAGNOSIS — F3160 Bipolar disorder, current episode mixed, unspecified: Secondary | ICD-10-CM

## 2020-04-02 DIAGNOSIS — F410 Panic disorder [episodic paroxysmal anxiety] without agoraphobia: Secondary | ICD-10-CM

## 2020-04-02 DIAGNOSIS — F4310 Post-traumatic stress disorder, unspecified: Secondary | ICD-10-CM

## 2020-04-02 NOTE — Telephone Encounter (Signed)
Prescription was sent to pharmacy on 02/19/2020. Please let patient know to talk to pharmacy . Please let him know he has been dismissed due to noncompliance and No shows.

## 2020-04-02 NOTE — Telephone Encounter (Signed)
pt called states that pharmacy did not get the cymbalta

## 2020-04-04 MED ORDER — DULOXETINE HCL 60 MG PO CPEP: 60.0000 mg | ORAL_CAPSULE | Freq: Every day | ORAL | 0 refills | Status: DC

## 2020-04-04 NOTE — Telephone Encounter (Signed)
pt has called twice states he is out of his cymbalta and he needs a refill

## 2020-04-04 NOTE — Telephone Encounter (Signed)
Patient's prescription for Cymbalta was sent out on 02/19/2020-30 capsules with 1 refill.  Since patient has been calling the clinic since the past 1 week reporting he is completely out of this medication writer called his pharmacy-[facility] -spoke to the pharmacist.  Per pharmacist patient picked up his Cymbalta prescription on 3/22 /2021-30 capsules, then again on 03/19/2020.  Patient should not be out of his medication at this time.  Patient has been dismissed from my care due to noncompliance and no-show appointments.  However I will give him another month of Cymbalta which I will send to his pharmacy with date specified when he is due for next refill.

## 2020-04-09 ENCOUNTER — Other Ambulatory Visit: Payer: Self-pay | Admitting: Psychiatry

## 2020-04-09 DIAGNOSIS — F4310 Post-traumatic stress disorder, unspecified: Secondary | ICD-10-CM

## 2020-04-09 DIAGNOSIS — F3160 Bipolar disorder, current episode mixed, unspecified: Secondary | ICD-10-CM

## 2020-04-09 DIAGNOSIS — F410 Panic disorder [episodic paroxysmal anxiety] without agoraphobia: Secondary | ICD-10-CM

## 2020-04-19 ENCOUNTER — Other Ambulatory Visit: Payer: Self-pay | Admitting: Physician Assistant

## 2020-04-19 DIAGNOSIS — F410 Panic disorder [episodic paroxysmal anxiety] without agoraphobia: Secondary | ICD-10-CM

## 2020-04-19 DIAGNOSIS — F3161 Bipolar disorder, current episode mixed, mild: Secondary | ICD-10-CM

## 2020-04-23 DIAGNOSIS — J019 Acute sinusitis, unspecified: Secondary | ICD-10-CM | POA: Diagnosis not present

## 2020-04-23 DIAGNOSIS — H1033 Unspecified acute conjunctivitis, bilateral: Secondary | ICD-10-CM | POA: Diagnosis not present

## 2020-04-23 DIAGNOSIS — R05 Cough: Secondary | ICD-10-CM | POA: Diagnosis not present

## 2020-05-01 ENCOUNTER — Other Ambulatory Visit: Payer: Self-pay | Admitting: Physician Assistant

## 2020-05-01 DIAGNOSIS — R059 Cough, unspecified: Secondary | ICD-10-CM

## 2020-05-01 DIAGNOSIS — K219 Gastro-esophageal reflux disease without esophagitis: Secondary | ICD-10-CM

## 2020-05-01 NOTE — Telephone Encounter (Signed)
Requested Prescriptions  Pending Prescriptions Disp Refills   famotidine (PEPCID) 20 MG tablet [Pharmacy Med Name: FAMOTIDINE 20 MG TAB] 180 tablet 0    Sig: TAKE 1 TABLET BY MOUTH TWICE DAILY     Gastroenterology:  H2 Antagonists Passed - 05/01/2020 11:56 AM      Passed - Valid encounter within last 12 months    Recent Outpatient Visits          1 month ago Cough   [place], Vickki Muff, [place]   2 months ago [place], [place], [place]   4 months ago Lymphadenopathy   [place], [place], [place]   5 months ago Cough   [place], [place]   6 months ago Acute low back pain without sciatica, unspecified back pain laterality   [place], Vickki Muff, [place]      Future Appointments            In 3 months [place], Ronda Fairly, [place]

## 2020-05-03 ENCOUNTER — Other Ambulatory Visit: Payer: Self-pay | Admitting: Physician Assistant

## 2020-05-03 DIAGNOSIS — F3160 Bipolar disorder, current episode mixed, unspecified: Secondary | ICD-10-CM

## 2020-05-03 DIAGNOSIS — F4310 Post-traumatic stress disorder, unspecified: Secondary | ICD-10-CM

## 2020-05-03 DIAGNOSIS — K219 Gastro-esophageal reflux disease without esophagitis: Secondary | ICD-10-CM

## 2020-05-03 DIAGNOSIS — R059 Cough, unspecified: Secondary | ICD-10-CM

## 2020-05-03 DIAGNOSIS — F410 Panic disorder [episodic paroxysmal anxiety] without agoraphobia: Secondary | ICD-10-CM

## 2020-05-03 NOTE — Telephone Encounter (Signed)
[facility] faxed refill request for the following medications:  DULoxetine (CYMBALTA) 60 MG capsule famotidine (PEPCID) 20 MG tablet omeprazole (PRILOSEC) 20 MG capsule  Please advise.  Thanks, [employer]

## 2020-05-07 MED ORDER — OMEPRAZOLE 20 MG PO CPDR: 20.0000 mg | DELAYED_RELEASE_CAPSULE | Freq: Two times a day (BID) | ORAL | 1 refills | Status: DC

## 2020-05-07 NOTE — Telephone Encounter (Signed)
I have declined cymbalta as he gets this from psychiatry. He should also be on one antacid. Will refill omeprazole.

## 2020-05-08 NOTE — Telephone Encounter (Signed)
Pt returned Russell Simpson call and stated he is no longer seeing other provider and that the Rx for DULoxetine (CYMBALTA) 60 MG capsule  Helps with his depression/ please advise asap

## 2020-05-08 NOTE — Telephone Encounter (Signed)
Called patient and no answer. Left detailed vm (per DPR) for patient and advised patient to return call if he has any concerns.

## 2020-05-09 MED ORDER — DULOXETINE HCL 60 MG PO CPEP: 60.0000 mg | ORAL_CAPSULE | Freq: Every day | ORAL | 0 refills | Status: DC

## 2020-05-09 NOTE — Telephone Encounter (Signed)
Patient was advised and expressed understanding. Patient said he will wait for the call for the new psychiatrist.

## 2020-05-09 NOTE — Telephone Encounter (Signed)
He has been under the care of a psychiatrist for a while for depression and anxiety that I could not previously manage. We have not had discussions about stopping seeing this provider and at our last visit I explicitly instructed him to follow up with the psychiatrist. This is not something I agreed to fill. He has been dismissed from psychiatry due to no shows and noncompliance after reading the 04/02/2020 note. He was given a courtesy month refill early in May so this is a change he has been aware of. I have sent in 30 days of cymbalta to [facility] and I will place a referral for him to establish with another psychiatrist and actually follow up because I cannot manage this and I will not continue to fill his medications.

## 2020-05-09 NOTE — Addendum Note (Signed)
Addended by: Trinna Post on: 05/09/2020 09:16 AM   Modules accepted: Orders

## 2020-05-13 ENCOUNTER — Other Ambulatory Visit: Payer: Self-pay | Admitting: Urology

## 2020-05-21 ENCOUNTER — Other Ambulatory Visit: Payer: Self-pay | Admitting: Urology

## 2020-05-22 NOTE — Progress Notes (Signed)
Established patient visit   Patient: Russell Simpson   DOB: 09/22/1961   59 y.o. Male  MRN: [id number] Visit Date: 05/23/2020  Today's healthcare provider: Trinna Post, PA-C   Chief Complaint  Patient presents with  . Anxiety  . Bipolar I   Mertie Moores as a scribe for [employer], PA-C.,have documented all relevant documentation on the behalf of Trinna Post, PA-C,as directed by  Trinna Post, PA-C while in the presence of Trinna Post, PA-C.  Subjective    HPI Anxiety, Follow-up  He was last seen for anxiety 3 months ago. Changes made at last visit include increased Vistaril.   He reports good compliance with treatment. He reports good tolerance of treatment. He is not having side effects.   He feels his anxiety is severe and Worse since last visit.  Symptoms: No chest pain No difficulty concentrating  Yes dizziness No fatigue  No feelings of losing control No insomnia  No irritable No palpitations  No panic attacks No racing thoughts  No shortness of breath No sweating  No tremors/shakes    GAD-7 Results GAD-7 Generalized Anxiety Disorder Screening Tool 05/23/2020  1. Feeling Nervous, Anxious, or on Edge 3  2. Not Being Able to Stop or Control Worrying 3  3. Worrying Too Much About Different Things 3  4. Trouble Relaxing 3  5. Being So Restless it's Hard To Sit Still 1  6. Becoming Easily Annoyed or Irritable 3  7. Feeling Afraid As If Something Awful Might Happen 3  Total GAD-7 Score 19  Difficulty At Work, Home, or Getting  Along With Others? Extremely difficult    PHQ-9 Scores PHQ9 SCORE ONLY 05/23/2020 03/27/2019 02/14/2019  PHQ-9 Total Score 19 10 18     --------------------------------------------------------------------------------------------------- Follow up for Bipolar I:   The patient was last seen for this 3 months ago. Changes made at last visit include started Hydroxyzine 50 mg TID.  He reports good  compliance with treatment. He feels that condition is Worse. He is not having side effects.   Reports he will just black out suddenly and wake up on the floor. He denies drinking. Denies palpitations. Denies seizure like movements.  -----------------------------------------------------------------------------------------       Medications: Outpatient Medications Prior to Visit  Medication Sig  . albuterol (VENTOLIN HFA) 108 (90 Base) MCG/ACT inhaler INHALE 2 PUFFS BY MOUTH EVERY 6 HOURS ASNEEDED FOR WHEEZING  . amLODipine (NORVASC) 5 MG tablet Take 1 tablet (5 mg total) by mouth daily.  . Arginine 2000 MG PACK Take by mouth daily.  [name] atorvastatin (LIPITOR) 10 MG tablet Take 1 tablet (10 mg total) by mouth at bedtime.  . DULoxetine (CYMBALTA) 60 MG capsule Take 1 capsule (60 mg total) by mouth daily.  . EUCRISA 2 % OINT Apply  a small amount to affected area   qd/bid to aa's chest, inframammary, eye  . famotidine (PEPCID) 20 MG tablet TAKE 1 TABLET BY MOUTH TWICE DAILY  . finasteride (PROSCAR) 5 MG tablet TAKE 1 TABLET BY MOUTH ONCE DAILY  . fluticasone (FLONASE) 50 MCG/ACT nasal spray Place into the nose.  . indomethacin (INDOCIN) 50 MG capsule TAKE 1 CAPSULE BY MOUTH 3 TIMES DAILY WITH MEALS  . loratadine (CLARITIN) 10 MG tablet Take 10 mg by mouth daily.   . metoprolol succinate (TOPROL-XL) 25 MG 24 hr tablet TAKE 1 TABLET BY MOUTH ONCE DAILY  . montelukast (SINGULAIR) 10 MG tablet Take 1 tablet (10 mg total) by  mouth at bedtime.  . naproxen sodium (ANAPROX) 220 MG tablet Take 220 mg by mouth 2 (two) times daily with a meal.  . omeprazole (PRILOSEC) 20 MG capsule Take 1 capsule (20 mg total) by mouth 2 (two) times daily before a meal.  . OVER THE COUNTER MEDICATION OREGA MAX  . OVER THE COUNTER MEDICATION RED MARINE ALGAE-375 MG.  . QUEtiapine (SEROQUEL) 100 MG tablet Take 0.5-1 tablets (50-100 mg total) by mouth as directed. Take half tablet ( 50 mg) daily as needed for severe  anxiety/panic attacks and 1 tablet( 100 mg) at bedtime  . sildenafil (REVATIO) 20 MG tablet 3-5 tablets daily as needed for erectile dysfunction  . tacrolimus (PROTOPIC) 0.1 % ointment Apply 1 application topically 2 (two) times daily.  [name] testosterone cypionate (DEPOTESTOSTERONE CYPIONATE) 200 MG/ML injection INJECT 0.5 ML INTRAMUSCULARLY SAME DAY WEEKLY  . hydrOXYzine (ATARAX/VISTARIL) 50 MG tablet TAKE 1 TABLET BY MOUTH 3 TIMES DAILY AS NEEDED (Patient not taking: Reported on 05/23/2020)  . [DISCONTINUED] azithromycin (ZITHROMAX) 250 MG tablet Take 250 mg by mouth as directed.  . [DISCONTINUED] benzonatate (TESSALON) 200 MG capsule Take 1 capsule (200 mg total) by mouth 3 (three) times daily as needed. (Patient not taking: Reported on 02/06/2020)  . [DISCONTINUED] benztropine (COGENTIN) 0.5 MG tablet Take 1 tablet (0.5 mg total) by mouth daily as needed. For muscle cramps and side effects of olanzapine  . [DISCONTINUED] Carbonyl Iron (PERFECT IRON) 25 MG TABS Take by mouth daily.  . [DISCONTINUED] Coenzyme Q10 (COQ-10) 100 MG CAPS Take by mouth daily.  . [DISCONTINUED] colchicine 0.6 MG tablet Take 2 tablets (1.2mg ) by mouth at first sign of gout flare followed by 1 tablet (0.6mg ) after 1 hour. (Max 1.8mg  within 1 hour)  . [DISCONTINUED] Cyanocobalamin (B-12) 5000 MCG SUBL Place under the tongue daily.  . [DISCONTINUED] cyclobenzaprine (FLEXERIL) 10 MG tablet Take 1 tablet (10 mg total) by mouth 3 (three) times daily as needed.  . [DISCONTINUED] DHEA 50 MG CAPS Take by mouth.  . [DISCONTINUED] gabapentin (NEURONTIN) 300 MG capsule TAKE 1 CAPSULE BY MOUTH TWICE DAILY  . [DISCONTINUED] Ginger, Zingiber officinalis, (GINGER PO) Take by mouth.  . [DISCONTINUED] Glucosamine 500 MG CAPS Take by mouth daily.  . [DISCONTINUED] ketorolac (TORADOL) 10 MG tablet Take 1 tablet (10 mg total) by mouth every 6 (six) hours as needed.  . [DISCONTINUED] lidocaine (LIDODERM) 5 % Place 1 patch onto the skin every 12  (twelve) hours. Remove & Discard patch within 12 hours or as directed by MD  . [DISCONTINUED] magnesium oxide (MAG-OX) 400 MG tablet Take 400 mg by mouth daily.  . [DISCONTINUED] meloxicam (MOBIC) 15 MG tablet Take 1 tablet (15 mg total) by mouth daily.  . [DISCONTINUED] methocarbamol (ROBAXIN) 500 MG tablet Take 1 tablet (500 mg total) by mouth every 8 (eight) hours as needed for muscle spasms.  . [DISCONTINUED] Omega-3 Fatty Acids (FISH OIL) 1000 MG CAPS Take by mouth.  . [DISCONTINUED] predniSONE (DELTASONE) 20 MG tablet Take 20 mg by mouth daily.  . [DISCONTINUED] tamsulosin (FLOMAX) 0.4 MG CAPS capsule Take 1 capsule (0.4 mg total) by mouth daily.  . [DISCONTINUED] triamcinolone cream (KENALOG) 0.1 % Apply 1 application topically 2 (two) times daily.   No facility-administered medications prior to visit.    Review of Systems  Constitutional: Positive for appetite change.  Respiratory: Positive for cough.   Cardiovascular: Negative.   Musculoskeletal: Negative.   Neurological: Positive for syncope.  Psychiatric/Behavioral: Positive for sleep disturbance.  Objective    BP (!) 130/91 (BP Location: Right Arm, Patient Position: Sitting, Cuff Size: Large)   Pulse 99   Temp (!) 97.1 F (36.2 C) (Temporal)   Wt 238 lb 9.6 oz (108.2 kg)   BMI 30.63 kg/m    Physical Exam Constitutional:      Appearance: Normal appearance.  Eyes:     Pupils: Pupils are equal, round, and reactive to light.  Cardiovascular:     Rate and Rhythm: Normal rate and regular rhythm.     Heart sounds: Normal heart sounds.  Pulmonary:     Effort: Pulmonary effort is normal.     Breath sounds: Normal breath sounds.  Abdominal:     General: Bowel sounds are normal.     Palpations: Abdomen is soft.  Skin:    General: Skin is warm and dry.  Neurological:     Mental Status: He is alert and oriented to person, place, and time. Mental status is at baseline.  Psychiatric:        Mood and Affect: Mood  normal.        Behavior: Behavior normal.       No results found for any visits on 05/23/20.  Assessment & Plan    1. Mixed bipolar I disorder [facility])  He was dismissed from psychiatry. Have referred him to new psychiatrist.   2. PTSD (post-traumatic stress disorder)   3. Alcohol use disorder, severe, in early remission ([place])   4. Syncope, unspecified syncope type  - Comprehensive Metabolic Panel (CMET) - CBC with Differential - Ambulatory referral to Cardiology  5. Hyperlipidemia, unspecified hyperlipidemia type  - Lipid Profile - HgB A1c  6. Reactive airway disease without complication, unspecified asthma severity, unspecified whether persistent  - fluticasone furoate-vilanterol (BREO ELLIPTA) 100-25 MCG/INH AEPB; Inhale 1 puff into the lungs daily.  Dispense: 60 each; Refill: 1  7. Cough  - fluticasone furoate-vilanterol (BREO ELLIPTA) 100-25 MCG/INH AEPB; Inhale 1 puff into the lungs daily.  Dispense: 60 each; Refill: 1   No follow-ups on file.      ITrinna Post, PA-C, have reviewed all documentation for this visit. The documentation on 06/20/20 for the exam, diagnosis, procedures, and orders are all accurate and complete.    Paulene Floor  [facility] [phone] (phone) [phone] (fax)  [place]

## 2020-05-23 ENCOUNTER — Encounter: Payer: Self-pay | Admitting: Physician Assistant

## 2020-05-23 ENCOUNTER — Telehealth: Payer: Self-pay | Admitting: Physician Assistant

## 2020-05-23 ENCOUNTER — Other Ambulatory Visit: Payer: Self-pay

## 2020-05-23 ENCOUNTER — Ambulatory Visit (INDEPENDENT_AMBULATORY_CARE_PROVIDER_SITE_OTHER): Payer: Medicare HMO | Admitting: Physician Assistant

## 2020-05-23 VITALS — BP 130/91 | HR 99 | Temp 97.1°F | Wt 238.6 lb

## 2020-05-23 DIAGNOSIS — E785 Hyperlipidemia, unspecified: Secondary | ICD-10-CM

## 2020-05-23 DIAGNOSIS — R55 Syncope and collapse: Secondary | ICD-10-CM | POA: Diagnosis not present

## 2020-05-23 DIAGNOSIS — R05 Cough: Secondary | ICD-10-CM

## 2020-05-23 DIAGNOSIS — F4310 Post-traumatic stress disorder, unspecified: Secondary | ICD-10-CM | POA: Diagnosis not present

## 2020-05-23 DIAGNOSIS — F1021 Alcohol dependence, in remission: Secondary | ICD-10-CM | POA: Diagnosis not present

## 2020-05-23 DIAGNOSIS — F3160 Bipolar disorder, current episode mixed, unspecified: Secondary | ICD-10-CM

## 2020-05-23 DIAGNOSIS — J45909 Unspecified asthma, uncomplicated: Secondary | ICD-10-CM | POA: Diagnosis not present

## 2020-05-23 DIAGNOSIS — R059 Cough, unspecified: Secondary | ICD-10-CM

## 2020-05-23 MED ORDER — BREO ELLIPTA 100-25 MCG/INH IN AEPB: 1.0000 | INHALATION_SPRAY | Freq: Every day | RESPIRATORY_TRACT | 1 refills | Status: DC

## 2020-05-23 NOTE — Telephone Encounter (Signed)
Hi Judson Roch, can we check on this psychiatric referral? Placed on 05/09/2020 and patient was not called.

## 2020-05-23 NOTE — Telephone Encounter (Signed)
Referral was set by Fabio Bering to [facility]. They have not contacted pt. Referral sent to [facility].Left message for pt to return my call in order to advise and give contact information

## 2020-05-23 NOTE — Telephone Encounter (Signed)
Please review. Thanks!  

## 2020-05-24 NOTE — Telephone Encounter (Signed)
Pt is returning sarah call concerning the below referral

## 2020-05-27 NOTE — Telephone Encounter (Signed)
Sarah, pls reach back out to [place] re this referral. He is returning your call.

## 2020-06-11 ENCOUNTER — Other Ambulatory Visit: Payer: Self-pay | Admitting: Physician Assistant

## 2020-06-11 DIAGNOSIS — F410 Panic disorder [episodic paroxysmal anxiety] without agoraphobia: Secondary | ICD-10-CM

## 2020-06-11 DIAGNOSIS — F4310 Post-traumatic stress disorder, unspecified: Secondary | ICD-10-CM

## 2020-06-11 DIAGNOSIS — F3160 Bipolar disorder, current episode mixed, unspecified: Secondary | ICD-10-CM

## 2020-06-11 NOTE — Telephone Encounter (Signed)
Received request for Cymbalta to be refilled. Upon chart review, found note written by PCP: Terrilee Croak Nurse Josefina Do, PA-C     9:16 AM Note   He has been under the care of a psychiatrist for a while for depression and anxiety that I could not previously manage. We have not had discussions about stopping seeing this provider and at our last visit I explicitly instructed him to follow up with the psychiatrist. This is not something I agreed to fill. He has been dismissed from psychiatry due to no shows and noncompliance after reading the 04/02/2020 note. He was given a courtesy month refill early in May so this is a change he has been aware of. I have sent in 30 days of cymbalta to [facility] and I will place a referral for him to establish with another psychiatrist and actually follow up because I cannot manage this and I will not continue to fill his medications.      Please advise.

## 2020-06-11 NOTE — Progress Notes (Deleted)
New Outpatient Visit Date: 06/12/2020  Referring Provider: Trinna Post, PA-C [address] [place],  [place] [address]  Chief Complaint: ***  HPI:  [name] is a 59 y.o. male who is being seen today for the evaluation of syncope at the request of Russell Simpson. He has a history of hypertension, sleep apnea (does not use CPAP) benign pituitary neoplasm, asthma, and depression. ***  --------------------------------------------------------------------------------------------------  Cardiovascular History & Procedures: Cardiovascular Problems:  Syncope  Risk Factors:  Hypertension, male gender, obesity, and age greater than 7  Cath/PCI:  None  CV Surgery:  None  EP Procedures and Devices:  None  Non-Invasive Evaluation(s):  None  Recent CV Pertinent Labs: Lab Results  Component Value Date   CHOL 255 (H) 02/14/2019   HDL 53 02/14/2019   LDLCALC 171 (H) 02/14/2019   LDLCALC 108 (H) 10/15/2017   TRIG 155 (H) 02/14/2019   CHOLHDL 4.8 02/14/2019   CHOLHDL 2.6 10/15/2017   INR 0.9 12/08/2019   K 4.0 12/08/2019   K 3.7 07/19/2013   BUN 15 12/08/2019   BUN 12 02/14/2019   BUN 12 07/19/2013   CREATININE 1.07 12/08/2019   CREATININE 1.14 10/15/2017    --------------------------------------------------------------------------------------------------  Past Medical History:  Diagnosis Date  . 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 Arthritis   . Asthma   . Brain tumor (benign) ([place])    benign pituitary neoplasm  . Chronic pain    right arm  . Depression   . Environmental and seasonal allergies   . Hypertension   . Pneumonia   . Sleep apnea    does not wear CPAP ; uses humidifier instead    Past Surgical History:  Procedure Laterality Date  . ANTERIOR CERVICAL DECOMP/DISCECTOMY FUSION N/A 06/17/2016   Procedure: ANTERIOR CERVICAL DECOMPRESSION FUSION, CERVICAL 3-4, CERVICAL 4-5 WITH INSTRUMENTATION AND ALLOGRAFT;  Surgeon: Russell Bob,  MD;  Location: [place];  Service: Orthopedics;  Laterality: N/A;  ANTERIOR CERVICAL DECOMPRESSION FUSION, CERVICAL 3-4, CERVICAL 4-5 WITH INSTRUMENTATION AND ALLOGRAFT  . BACK SURGERY    . NECK SURGERY  2009    No outpatient medications have been marked as taking for the 06/12/20 encounter (Appointment) with Russell Simpson, Russell Gave, MD.    Allergies: Penicillins, Tetanus toxoids, Lisinopril, Losartan, Tetanus toxoid, Codeine, and Doxycycline  Social History   Tobacco Use  . Smoking status: Former Smoker    Years: 20.00    Types: E-cigarettes, Cigars  . Smokeless tobacco: Never Used  Vaping Use  . Vaping Use: Never used  Substance Use Topics  . Alcohol use: Not Currently    Alcohol/week: 0.0 standard drinks    Comment: 12 pack a beer a night  . Drug use: No    Family History  Problem Relation Age of Onset  . Diabetes Mother   . Diabetes Other     Review of Systems: A 12-system review of systems was performed and was negative except as noted in the HPI.  --------------------------------------------------------------------------------------------------  Physical Exam: There were no vitals taken for this visit.  General:  *** HEENT: No conjunctival pallor or scleral icterus. Facemask in place. Neck: Supple without lymphadenopathy, thyromegaly, JVD, or HJR. No carotid bruit. Lungs: Normal work of breathing. Clear to auscultation bilaterally without wheezes or crackles. Heart: Regular rate and rhythm without murmurs, rubs, or gallops. Non-displaced PMI. Abd: Bowel sounds present. Soft, NT/ND without hepatosplenomegaly Ext: No lower extremity edema. Radial, PT, and DP pulses are 2+ bilaterally Skin: Warm and dry without rash. Neuro:  CNIII-XII intact. Strength and fine-touch sensation intact in upper and lower extremities bilaterally. Psych: Normal mood and affect.  EKG:  ***  Lab Results  Component Value Date   WBC 8.7 12/08/2019   HGB 17.0 12/08/2019   HCT 49.0 02/07/2020    MCV 90.8 12/08/2019   PLT 213 12/08/2019    Lab Results  Component Value Date   NA 133 (L) 12/08/2019   K 4.0 12/08/2019   CL 101 12/08/2019   CO2 19 (L) 12/08/2019   BUN 15 12/08/2019   CREATININE 1.07 12/08/2019   GLUCOSE 135 (H) 12/08/2019   ALT 35 12/08/2019    Lab Results  Component Value Date   CHOL 255 (H) 02/14/2019   HDL 53 02/14/2019   LDLCALC 171 (H) 02/14/2019   TRIG 155 (H) 02/14/2019   CHOLHDL 4.8 02/14/2019     --------------------------------------------------------------------------------------------------  ASSESSMENT AND PLAN: Russell Bush, MD 06/11/2020 7:42 PM

## 2020-06-12 ENCOUNTER — Ambulatory Visit: Payer: Self-pay | Admitting: Internal Medicine

## 2020-06-14 ENCOUNTER — Other Ambulatory Visit: Payer: Self-pay | Admitting: Oncology

## 2020-06-18 ENCOUNTER — Other Ambulatory Visit: Payer: Self-pay | Admitting: Physician Assistant

## 2020-06-18 DIAGNOSIS — F4310 Post-traumatic stress disorder, unspecified: Secondary | ICD-10-CM

## 2020-06-18 DIAGNOSIS — F410 Panic disorder [episodic paroxysmal anxiety] without agoraphobia: Secondary | ICD-10-CM

## 2020-06-18 DIAGNOSIS — F3160 Bipolar disorder, current episode mixed, unspecified: Secondary | ICD-10-CM

## 2020-06-18 MED ORDER — DULOXETINE HCL 60 MG PO CPEP: 60.0000 mg | ORAL_CAPSULE | Freq: Every day | ORAL | 0 refills | Status: DC

## 2020-06-18 NOTE — Telephone Encounter (Signed)
PT need a refill DULoxetine (CYMBALTA) 60 MG capsule [[id number]]  [place], [place] [address]  Phone: [phone] Fax: [phone]

## 2020-06-22 ENCOUNTER — Other Ambulatory Visit: Payer: Self-pay | Admitting: Physician Assistant

## 2020-06-22 ENCOUNTER — Other Ambulatory Visit: Payer: Self-pay | Admitting: Psychiatry

## 2020-06-22 DIAGNOSIS — R059 Cough, unspecified: Secondary | ICD-10-CM

## 2020-06-22 DIAGNOSIS — J45909 Unspecified asthma, uncomplicated: Secondary | ICD-10-CM

## 2020-06-22 DIAGNOSIS — F4310 Post-traumatic stress disorder, unspecified: Secondary | ICD-10-CM

## 2020-06-22 DIAGNOSIS — R05 Cough: Secondary | ICD-10-CM

## 2020-06-22 DIAGNOSIS — F410 Panic disorder [episodic paroxysmal anxiety] without agoraphobia: Secondary | ICD-10-CM

## 2020-06-22 NOTE — Telephone Encounter (Signed)
Requested Prescriptions  Pending Prescriptions Disp Refills   BREO ELLIPTA 100-25 MCG/INH AEPB [Pharmacy Med Name: BREO ELLIPTA 100-25 MCG/INH INH AEP] 60 each 1    Sig: INHALE 1 PUFF BY MOUTH ONCE DAILY     Pulmonology:  Combination Products Passed - 06/22/2020 11:23 AM      Passed - Valid encounter within last 12 months    Recent Outpatient Visits          1 month ago Mixed bipolar I disorder [facility])   [place], [place], PA-C   3 months ago Cough   [employer], Vickki Muff, [place]   4 months ago [place], [place], [place]   6 months ago Lymphadenopathy   [place], [place], [place]   7 months ago Cough   [place], Clearnce Sorrel, [place]      Future Appointments            In 2 days [place], Aaron Edelman, MD [facility], [place]   In 1 month [place], Ronda Fairly, [place]

## 2020-06-22 NOTE — Telephone Encounter (Signed)
Requested Prescriptions  Pending Prescriptions Disp Refills  . montelukast (SINGULAIR) 10 MG tablet [Pharmacy Med Name: MONTELUKAST SODIUM 10 MG TAB] 90 tablet 1    Sig: TAKE 1 TABLET BY MOUTH AT BEDTIME     Pulmonology:  Leukotriene Inhibitors Passed - 06/22/2020  9:47 AM      Passed - Valid encounter within last 12 months    Recent Outpatient Visits          1 month ago Mixed bipolar I disorder [facility])   [place], [place], PA-C   3 months ago Cough   [employer], Vickki Muff, [place]   4 months ago [place], [place], [place]   6 months ago Lymphadenopathy   [place], [place], [place]   7 months ago Cough   [place], Clearnce Sorrel, [place]      Future Appointments            In 2 days Ag[place]-Etang, Aaron Edelman, MD [facility], [place]   In 1 month [place], Ronda Fairly, [place]

## 2020-06-24 ENCOUNTER — Other Ambulatory Visit: Payer: Self-pay

## 2020-06-24 ENCOUNTER — Encounter: Payer: Self-pay | Admitting: Cardiology

## 2020-06-24 ENCOUNTER — Ambulatory Visit: Payer: Medicare HMO | Admitting: Cardiology

## 2020-06-24 ENCOUNTER — Ambulatory Visit (INDEPENDENT_AMBULATORY_CARE_PROVIDER_SITE_OTHER): Payer: Medicare HMO

## 2020-06-24 ENCOUNTER — Other Ambulatory Visit: Payer: Self-pay | Admitting: Physician Assistant

## 2020-06-24 VITALS — BP 134/92 | HR 131 | Ht 74.0 in | Wt 235.4 lb

## 2020-06-24 DIAGNOSIS — E7800 Pure hypercholesterolemia, unspecified: Secondary | ICD-10-CM

## 2020-06-24 DIAGNOSIS — I10 Essential (primary) hypertension: Secondary | ICD-10-CM | POA: Diagnosis not present

## 2020-06-24 DIAGNOSIS — F17200 Nicotine dependence, unspecified, uncomplicated: Secondary | ICD-10-CM

## 2020-06-24 DIAGNOSIS — R55 Syncope and collapse: Secondary | ICD-10-CM | POA: Diagnosis not present

## 2020-06-24 DIAGNOSIS — J45909 Unspecified asthma, uncomplicated: Secondary | ICD-10-CM | POA: Diagnosis not present

## 2020-06-24 NOTE — Patient Instructions (Signed)
Medication Instructions:  Your physician recommends that you continue on your current medications as directed. Please refer to the Current Medication list given to you today.  *If you need a refill on your cardiac medications before your next appointment, please call your pharmacy*   Lab Work: None ordered If you have labs (blood work) drawn today and your tests are completely normal, you will receive your results only by: [name] MyChart Message (if you have MyChart) OR . A paper copy in the mail If you have any lab test that is abnormal or we need to change your treatment, we will call you to review the results.   Testing/Procedures: Your physician has requested that you have an echocardiogram. Echocardiography is a painless test that uses sound waves to create images of your heart. It provides your doctor with information about the size and shape of your heart and how well your heart's chambers and valves are working. This procedure takes approximately one hour. There are no restrictions for this procedure.  Your physician has recommended that you wear a Zio monitor. (To be worn for 14 days).This monitor is a medical device that records the heart's electrical activity. Doctors most often use these monitors to diagnose arrhythmias. Arrhythmias are problems with the speed or rhythm of the heartbeat. The monitor is a small device applied to your chest. You can wear one while you do your normal daily activities. While wearing this monitor if you have any symptoms to push the button and record what you felt. Once you have worn this monitor for the period of time provider prescribed (Usually 14 days), you will return the monitor device in the postage paid box. Once it is returned they will download the data collected and provide [place] with a report which the provider will then review and we will call you with those results. Important tips:  1. Avoid showering during the first 24 hours of wearing the  monitor. 2. Avoid excessive sweating to help maximize wear time. 3. Do not submerge the device, no hot tubs, and no swimming pools. 4. Keep any lotions or oils away from the patch. 5. After 24 hours you may shower with the patch on. Take brief showers with your back facing the shower head.  6. Do not remove patch once it has been placed because that will interrupt data and decrease adhesive wear time. 7. Push the button when you have any symptoms and write down what you were feeling. 8. Once you have completed wearing your monitor, remove and place into box which has postage paid and place in your outgoing mailbox.  9. If for some reason you have misplaced your box then call our office and we can provide another box and/or mail it off for you.         Follow-Up: At [facility], you and your health needs are our priority.  As part of our continuing mission to provide you with exceptional heart care, we have created designated Provider Care Teams.  These Care Teams include your primary Cardiologist (physician) and Advanced Practice Providers (APPs -  Physician Assistants and Nurse Practitioners) who all work together to provide you with the care you need, when you need it.  We recommend signing up for the patient portal called "MyChart".  Sign up information is provided on this After Visit Summary.  MyChart is used to connect with patients for Virtual Visits (Telemedicine).  Patients are able to view lab/test results, encounter notes, upcoming appointments, etc.  Non-urgent messages can be sent to your provider as well.   To learn more about what you can do with MyChart, go to [email or web address].    Your next appointment:   4-5 week(s)   You have been referred to [facility] Pulmonology. Their office will contact you to schedule.   The format for your next appointment:   In Person  Provider:    You may see Kate Sable, MD or one of the following Advanced Practice Providers  on your designated Care Team:    Murray Hodgkins, NP  Christell Faith, PA-C  Marrianne Mood, PA-C    Other Instructions N/A
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
## 2020-06-25 ENCOUNTER — Ambulatory Visit (INDEPENDENT_AMBULATORY_CARE_PROVIDER_SITE_OTHER): Payer: Medicare HMO

## 2020-06-25 ENCOUNTER — Ambulatory Visit: Payer: Medicare HMO | Admitting: Pulmonary Disease

## 2020-06-25 ENCOUNTER — Telehealth: Payer: Self-pay

## 2020-06-25 ENCOUNTER — Encounter: Payer: Self-pay | Admitting: Pulmonary Disease

## 2020-06-25 VITALS — BP 144/94 | HR 100 | Temp 98.4°F | Ht 74.0 in | Wt 238.2 lb

## 2020-06-25 DIAGNOSIS — M62830 Muscle spasm of back: Secondary | ICD-10-CM | POA: Diagnosis not present

## 2020-06-25 DIAGNOSIS — M9903 Segmental and somatic dysfunction of lumbar region: Secondary | ICD-10-CM | POA: Diagnosis not present

## 2020-06-25 DIAGNOSIS — J449 Chronic obstructive pulmonary disease, unspecified: Secondary | ICD-10-CM

## 2020-06-25 DIAGNOSIS — K219 Gastro-esophageal reflux disease without esophagitis: Secondary | ICD-10-CM

## 2020-06-25 DIAGNOSIS — R55 Syncope and collapse: Secondary | ICD-10-CM

## 2020-06-25 DIAGNOSIS — R058 Other specified cough: Secondary | ICD-10-CM

## 2020-06-25 DIAGNOSIS — M9905 Segmental and somatic dysfunction of pelvic region: Secondary | ICD-10-CM | POA: Diagnosis not present

## 2020-06-25 DIAGNOSIS — R05 Cough: Secondary | ICD-10-CM

## 2020-06-25 DIAGNOSIS — M5136 Other intervertebral disc degeneration, lumbar region: Secondary | ICD-10-CM | POA: Diagnosis not present

## 2020-06-25 DIAGNOSIS — R059 Cough, unspecified: Secondary | ICD-10-CM

## 2020-06-25 MED ORDER — BREZTRI AEROSPHERE 160-9-4.8 MCG/ACT IN AERO: 2.0000 | INHALATION_SPRAY | Freq: Two times a day (BID) | RESPIRATORY_TRACT | 0 refills | Status: DC

## 2020-06-25 MED ORDER — LANSOPRAZOLE 30 MG PO CPDR: 30.0000 mg | DELAYED_RELEASE_CAPSULE | Freq: Every day | ORAL | 3 refills | Status: DC

## 2020-06-25 NOTE — Patient Instructions (Addendum)
Recommend that you stop taking any supplements that contain fish oil, calcium, arginine.  These can aggravate reflux and also cause cough.  We discussed how to stop the cough once it starts.  We are giving you a trial of Breztri 2 puffs twice a day.  DO NOT USE BREO WHILE USING BREZTRI.  Let us know how Judithann Sauger works for you so we can call it into your pharmacy  Rinse your mouth well after you use Breztri.  You may use a little baking soda (sodium bicarbonate) in the water use to rinse with.  Every 2 weeks rinse the mouth piece of your inhaler well under hot water (good to do with any inhaler as the spray can "gum up" the nozzle).  QUIT SMOKING!  We are giving you a stronger medicine for reflux.  Make sure that you do not go to bed for at least 3 hours after your last meal of the day.  We will schedule you for breathing tests.  We will see him in follow-up in 6 to 8 weeks time call sooner should any new difficulties arise.

## 2020-06-25 NOTE — Progress Notes (Signed)
 Assessment & Plan:  1. COPD suggested by initial evaluation (Primary) Comments: PFTs ordered Trial of Breztri  2 puffs twice a day Discontinue Breo - Pulmonary Function Test [facility] Only; Future  2. Cough Comments: Discontinue use of supplements like fish oil, calcium  and arginine Quit smoking PFTs - Pulmonary Function Test [facility] Only; Future  3. Post-tussive syncope Comments: Discussed techniques to minimize impact Needs to quit smoking  4. Gastroesophageal reflux disease, unspecified whether esophagitis present Comments: Trial of Prevacid  Antireflux measures   Patient Instructions  Recommend that you stop taking any supplements that contain fish oil, calcium , arginine.  These can aggravate reflux and also cause cough.  We discussed how to stop the cough once it starts.  We are giving you a trial of Breztri  2 puffs twice a day.  DO NOT USE BREO WHILE USING BREZTRI .  Let us  know how Breztri  works for you so we can call it into your pharmacy  Rinse your mouth well after you use Breztri .  You may use a little baking soda (sodium bicarbonate) in the water  use to rinse with.  Every 2 weeks rinse the mouth piece of your inhaler well under hot water  (good to do with any inhaler as the spray can gum up the nozzle).  QUIT SMOKING!  We are giving you a stronger medicine for reflux.  Make sure that you do not go to bed for at least 3 hours after your last meal of the day.  We will schedule you for breathing tests.  We will see him in follow-up in 6 to 8 weeks time call sooner should any new difficulties arise.  Please note: late entry documentation due to logistical difficulties during COVID-19 pandemic. This note is filed for information purposes only, and is not intended to be used for billing, nor does it represent the full scope/nature of the visit in question. Please see any associated scanned media linked to date of encounter for additional pertinent information.  Subjective:     HPI: Russell Simpson is a 59 y.o. male presenting to the pulmonology clinic on 06/25/2020 with report of: Pulmonary Consult (Referred by Dr. Budd- Dale[place]. Pt c/o cough and SOB since Dec 2020. He states he coughs until he can't catch his breath and then passes out. )     Outpatient Encounter Medications as of 06/25/2020  Medication Sig   [DISCONTINUED] amLODipine  (NORVASC ) 5 MG tablet Take 1 tablet (5 mg total) by mouth daily.   [DISCONTINUED] Arginine 2000 MG PACK Take by mouth daily.   [DISCONTINUED] aspirin  EC 81 MG tablet Take 81 mg by mouth daily. Swallow whole. (Patient not taking: Reported on 06/04/2021)   [DISCONTINUED] BREO ELLIPTA  100-25 MCG/INH AEPB INHALE 1 PUFF BY MOUTH ONCE DAILY   [DISCONTINUED] DULoxetine  (CYMBALTA ) 60 MG capsule Take 1 capsule (60 mg total) by mouth daily.   [DISCONTINUED] EUCRISA  2 % OINT Apply  a small amount to affected area   qd/bid to aa's chest, inframammary, eye   [DISCONTINUED] famotidine  (PEPCID ) 20 MG tablet TAKE 1 TABLET BY MOUTH TWICE DAILY   [DISCONTINUED] finasteride  (PROSCAR ) 5 MG tablet TAKE 1 TABLET BY MOUTH ONCE DAILY   [DISCONTINUED] fluticasone  (FLONASE ) 50 MCG/ACT nasal spray Place 1 spray into the nose daily.  (Patient not taking: No sig reported)   [DISCONTINUED] loratadine  (CLARITIN ) 10 MG tablet Take 10 mg by mouth daily.    [DISCONTINUED] metoprolol  succinate (TOPROL -XL) 25 MG 24 hr tablet TAKE 1 TABLET BY MOUTH ONCE DAILY   [DISCONTINUED]  montelukast  (SINGULAIR ) 10 MG tablet TAKE 1 TABLET BY MOUTH AT BEDTIME   [DISCONTINUED] naproxen sodium (ANAPROX) 220 MG tablet Take 220 mg by mouth 2 (two) times daily with a meal.   [DISCONTINUED] OLANZapine  (ZYPREXA ) 7.5 MG tablet Take 7.5 mg by mouth at bedtime.   [DISCONTINUED] omeprazole  (PRILOSEC) 20 MG capsule Take 1 capsule (20 mg total) by mouth 2 (two) times daily before a meal.   [DISCONTINUED] OVER THE COUNTER MEDICATION OREGA MAX   [DISCONTINUED] OVER THE COUNTER MEDICATION RED  MARINE ALGAE-375 MG.   [DISCONTINUED] sildenafil  (REVATIO ) 20 MG tablet 3-5 tablets daily as needed for erectile dysfunction   [DISCONTINUED] tacrolimus  (PROTOPIC ) 0.1 % ointment Apply 1 application topically 2 (two) times daily.   [DISCONTINUED] testosterone  cypionate (DEPOTESTOSTERONE CYPIONATE) 200 MG/ML injection INJECT 0.5 ML INTRAMUSCULARLY SAME DAY WEEKLY   [DISCONTINUED] Budeson-Glycopyrrol-Formoterol  (BREZTRI  AEROSPHERE) 160-9-4.8 MCG/ACT AERO Inhale 2 puffs into the lungs 2 (two) times daily. (Patient not taking: Reported on 05/14/2021)   [DISCONTINUED] lansoprazole  (PREVACID ) 30 MG capsule Take 1 capsule (30 mg total) by mouth daily at 12 noon.   No facility-administered encounter medications on file as of 06/25/2020.      Objective:   Vitals:   06/25/20 0951  BP: (!) 144/94  Pulse: 100  Temp: 98.4 F (36.9 C)  Height: 6' 2 (1.88 m)  Weight: (!) 238 lb 3.2 oz (108 kg)  SpO2: 95% Comment: on RA  TempSrc: Oral  BMI (Calculated): 30.57     Physical exam documentation is limited by delayed entry of information.

## 2020-06-25 NOTE — Telephone Encounter (Signed)
Patient called yesterday (06/24/2020) shortly after ZIO was placed stating that it fell off. - Patient spoke with Hulan Amato.   [place] called me and made me aware and said that the patient stated that he would be seeing pulmonary 06/25/2020- he states he would super glue the ZIO monitor to himself and he would stop by the office if he had any issues with the monitor after his appt with pulmonary.  Patient did stop by the office today. I removed the monitor he super glued to himself and told him to go ahead and send that monitor back to the company.   I placed a new monitor on patient and registered it in [place].

## 2020-06-28 DIAGNOSIS — F410 Panic disorder [episodic paroxysmal anxiety] without agoraphobia: Secondary | ICD-10-CM | POA: Diagnosis not present

## 2020-06-28 DIAGNOSIS — F411 Generalized anxiety disorder: Secondary | ICD-10-CM | POA: Diagnosis not present

## 2020-06-28 DIAGNOSIS — F1010 Alcohol abuse, uncomplicated: Secondary | ICD-10-CM | POA: Diagnosis not present

## 2020-06-28 DIAGNOSIS — F3162 Bipolar disorder, current episode mixed, moderate: Secondary | ICD-10-CM | POA: Diagnosis not present

## 2020-07-01 DIAGNOSIS — E221 Hyperprolactinemia: Secondary | ICD-10-CM | POA: Diagnosis not present

## 2020-07-01 DIAGNOSIS — H02881 Meibomian gland dysfunction right upper eyelid: Secondary | ICD-10-CM | POA: Diagnosis not present

## 2020-07-01 DIAGNOSIS — H02884 Meibomian gland dysfunction left upper eyelid: Secondary | ICD-10-CM | POA: Diagnosis not present

## 2020-07-04 ENCOUNTER — Telehealth: Payer: Self-pay | Admitting: Oncology

## 2020-07-04 NOTE — Telephone Encounter (Signed)
Writer was informed on this date from insurance authorization team that scan has not yet been authorized and was asked to cancel scan. Scan cancelled per authorization request. Writer phoned patient and informed of cancelled scan and reason for this. Team informed of the above.

## 2020-07-05 ENCOUNTER — Telehealth: Payer: Self-pay | Admitting: Oncology

## 2020-07-05 ENCOUNTER — Telehealth: Payer: Self-pay

## 2020-07-05 NOTE — Telephone Encounter (Signed)
Received message this AM that insurance authorization was received. Phoned and rescheduled CT. Also phoned patient and left voicemail informing that insurance authorization was received, scan was rescheduled, and gave appt details. MD team made aware.

## 2020-07-05 NOTE — Telephone Encounter (Signed)
Pt is aware of date/time of covid test prior to PFT.  

## 2020-07-08 ENCOUNTER — Ambulatory Visit: Payer: Medicare HMO

## 2020-07-09 ENCOUNTER — Other Ambulatory Visit
Admission: RE | Admit: 2020-07-09 | Discharge: 2020-07-09 | Disposition: A | Discharge status: Home / Self Care | Payer: Medicare HMO | Source: Ambulatory Visit | Attending: Pulmonary Disease | Admitting: Pulmonary Disease

## 2020-07-09 ENCOUNTER — Other Ambulatory Visit: Payer: Self-pay

## 2020-07-10 ENCOUNTER — Other Ambulatory Visit
Admission: RE | Admit: 2020-07-10 | Discharge: 2020-07-10 | Disposition: A | Discharge status: Home / Self Care | Payer: Medicare HMO | Source: Ambulatory Visit | Attending: Family Medicine | Admitting: Family Medicine

## 2020-07-10 ENCOUNTER — Other Ambulatory Visit: Payer: Self-pay

## 2020-07-10 ENCOUNTER — Other Ambulatory Visit: Payer: Self-pay | Admitting: Pulmonary Disease

## 2020-07-10 ENCOUNTER — Ambulatory Visit: Payer: Medicare HMO

## 2020-07-10 DIAGNOSIS — R059 Cough, unspecified: Secondary | ICD-10-CM

## 2020-07-10 DIAGNOSIS — Z01812 Encounter for preprocedural laboratory examination: Secondary | ICD-10-CM | POA: Diagnosis not present

## 2020-07-10 DIAGNOSIS — J449 Chronic obstructive pulmonary disease, unspecified: Secondary | ICD-10-CM

## 2020-07-10 DIAGNOSIS — Z20822 Contact with and (suspected) exposure to covid-19: Secondary | ICD-10-CM | POA: Diagnosis not present

## 2020-07-11 ENCOUNTER — Ambulatory Visit
Admission: RE | Admit: 2020-07-11 | Discharge: 2020-07-11 | Disposition: A | Discharge status: Home / Self Care | Payer: Medicare HMO | Source: Ambulatory Visit | Attending: Oncology | Admitting: Oncology

## 2020-07-11 ENCOUNTER — Ambulatory Visit (HOSPITAL_COMMUNITY): Payer: Medicare HMO

## 2020-07-11 DIAGNOSIS — J449 Chronic obstructive pulmonary disease, unspecified: Secondary | ICD-10-CM

## 2020-07-11 DIAGNOSIS — S2241XA Multiple fractures of ribs, right side, initial encounter for closed fracture: Secondary | ICD-10-CM | POA: Diagnosis not present

## 2020-07-11 DIAGNOSIS — S2243XA Multiple fractures of ribs, bilateral, initial encounter for closed fracture: Secondary | ICD-10-CM | POA: Diagnosis not present

## 2020-07-11 DIAGNOSIS — Z87891 Personal history of nicotine dependence: Secondary | ICD-10-CM | POA: Insufficient documentation

## 2020-07-11 DIAGNOSIS — I2510 Atherosclerotic heart disease of native coronary artery without angina pectoris: Secondary | ICD-10-CM | POA: Diagnosis not present

## 2020-07-11 DIAGNOSIS — R590 Localized enlarged lymph nodes: Secondary | ICD-10-CM | POA: Diagnosis not present

## 2020-07-11 DIAGNOSIS — R05 Cough: Secondary | ICD-10-CM | POA: Diagnosis not present

## 2020-07-11 DIAGNOSIS — I700 Atherosclerosis of aorta: Secondary | ICD-10-CM | POA: Diagnosis not present

## 2020-07-11 DIAGNOSIS — R059 Cough, unspecified: Secondary | ICD-10-CM

## 2020-07-11 LAB — PULMONARY FUNCTION TEST ARMC ONLY
DL/VA % pred: 94 %
DL/VA: 3.98 ml/min/mmHg/L
DLCO unc % pred: 75 %
DLCO unc: 23.88 ml/min/mmHg
FEF 25-75 Post: 1.5 L/sec
FEF 25-75 Pre: 1.97 L/sec
FEF2575-%Change-Post: -23 %
FEF2575-%Pred-Post: 42 %
FEF2575-%Pred-Pre: 56 %
FEV1-%Change-Post: -4 %
FEV1-%Pred-Post: 65 %
FEV1-%Pred-Pre: 68 %
FEV1-Post: 2.76 L
FEV1-Pre: 2.9 L
FEV1FVC-%Change-Post: 3 %
FEV1FVC-%Pred-Pre: 80 %
FEV6-%Change-Post: -8 %
FEV6-%Pred-Post: 79 %
FEV6-%Pred-Pre: 87 %
FEV6-Post: 4.25 L
FEV6-Pre: 4.65 L
FEV6FVC-%Change-Post: 0 %
FEV6FVC-%Pred-Post: 101 %
FEV6FVC-%Pred-Pre: 102 %
FVC-%Change-Post: -8 %
FVC-%Pred-Post: 78 %
FVC-%Pred-Pre: 85 %
FVC-Post: 4.36 L
Post FEV1/FVC ratio: 63 %
Post FEV6/FVC ratio: 97 %
Pre FEV1/FVC ratio: 61 %
Pre FEV6/FVC Ratio: 98 %
RV % pred: 66 %
RV: 1.59 L
TLC % pred: 77 %
TLC: 6.07 L

## 2020-07-11 LAB — SARS CORONAVIRUS 2 (TAT 6-24 HRS): SARS Coronavirus 2: NEGATIVE

## 2020-07-11 LAB — POCT I-STAT CREATININE: Creatinine, Ser: 1 mg/dL (ref 0.61–1.24)

## 2020-07-11 MED ORDER — IOHEXOL 300 MG/ML  SOLN
75.0000 mL | Freq: Once | INTRAMUSCULAR | Status: AC | PRN
  Administered 2020-07-11: 75 mL via INTRAVENOUS

## 2020-07-11 MED ORDER — ALBUTEROL SULFATE (2.5 MG/3ML) 0.083% IN NEBU
2.5000 mg | INHALATION_SOLUTION | Freq: Once | RESPIRATORY_TRACT | Status: AC
  Administered 2020-07-11: 2.5 mg via RESPIRATORY_TRACT
  Filled 2020-07-11: qty 3

## 2020-07-16 ENCOUNTER — Inpatient Hospital Stay: Payer: Medicare HMO | Admitting: Oncology

## 2020-07-16 ENCOUNTER — Other Ambulatory Visit: Payer: Self-pay | Admitting: Oncology

## 2020-07-16 DIAGNOSIS — R55 Syncope and collapse: Secondary | ICD-10-CM | POA: Diagnosis not present

## 2020-07-16 NOTE — Progress Notes (Signed)
multi 

## 2020-07-18 ENCOUNTER — Other Ambulatory Visit: Payer: Self-pay

## 2020-07-18 ENCOUNTER — Other Ambulatory Visit: Payer: Self-pay | Admitting: *Deleted

## 2020-07-18 ENCOUNTER — Other Ambulatory Visit: Payer: Medicare HMO

## 2020-07-18 DIAGNOSIS — R55 Syncope and collapse: Secondary | ICD-10-CM

## 2020-07-18 NOTE — Progress Notes (Signed)
Tumor Board Documentation  Russell Simpson was presented by Dr Janese Banks at our Tumor Board on 07/18/2020, which included representatives from medical oncology, radiation oncology, surgical oncology, internal medicine, navigation, pathology, radiology, surgical, pharmacy, research, palliative care, pulmonology.  Russell Simpson currently presents as a current patient, for discussion with history of the following treatments: active survellience.  Additionally, we reviewed previous medical and familial history, history of present illness, and recent lab results along with all available histopathologic and imaging studies. The tumor board considered available treatment options and made the following recommendations: Biopsy (US Guided of Axillary Lymph Node)    The following procedures/referrals were also placed: No orders of the defined types were placed in this encounter.   Clinical Trial Status: not discussed   Staging used:    National site-specific guidelines   were discussed with respect to the case.  Tumor board is a meeting of clinicians from various specialty areas who evaluate and discuss patients for whom a multidisciplinary approach is being considered. Final determinations in the plan of care are those of the provider(s). The responsibility for follow up of recommendations given during tumor board is that of the provider.   Today's extended care, comprehensive team conference, Russell Simpson was not present for the discussion and was not examined.   Multidisciplinary Tumor Board is a multidisciplinary case peer review process.  Decisions discussed in the Multidisciplinary Tumor Board reflect the opinions of the specialists present at the conference without having examined the patient.  Ultimately, treatment and diagnostic decisions rest with the primary provider(s) and the patient.

## 2020-07-19 ENCOUNTER — Encounter: Payer: Self-pay | Admitting: Oncology

## 2020-07-19 ENCOUNTER — Inpatient Hospital Stay: Payer: Medicare HMO | Attending: Oncology | Admitting: Oncology

## 2020-07-19 ENCOUNTER — Other Ambulatory Visit: Payer: Self-pay

## 2020-07-19 ENCOUNTER — Other Ambulatory Visit: Payer: Self-pay | Admitting: *Deleted

## 2020-07-19 VITALS — BP 145/99 | HR 112 | Temp 96.9°F | Resp 18 | Wt 244.4 lb

## 2020-07-19 DIAGNOSIS — I10 Essential (primary) hypertension: Secondary | ICD-10-CM | POA: Diagnosis not present

## 2020-07-19 DIAGNOSIS — F17210 Nicotine dependence, cigarettes, uncomplicated: Secondary | ICD-10-CM | POA: Insufficient documentation

## 2020-07-19 DIAGNOSIS — R935 Abnormal findings on diagnostic imaging of other abdominal regions, including retroperitoneum: Secondary | ICD-10-CM

## 2020-07-19 DIAGNOSIS — R590 Localized enlarged lymph nodes: Secondary | ICD-10-CM

## 2020-07-22 ENCOUNTER — Other Ambulatory Visit: Payer: Self-pay | Admitting: Physician Assistant

## 2020-07-22 ENCOUNTER — Other Ambulatory Visit: Payer: Self-pay | Admitting: *Deleted

## 2020-07-22 ENCOUNTER — Other Ambulatory Visit: Payer: Medicare HMO

## 2020-07-22 ENCOUNTER — Other Ambulatory Visit: Payer: Self-pay | Admitting: Oncology

## 2020-07-22 ENCOUNTER — Telehealth: Payer: Self-pay | Admitting: *Deleted

## 2020-07-22 DIAGNOSIS — F3160 Bipolar disorder, current episode mixed, unspecified: Secondary | ICD-10-CM

## 2020-07-22 DIAGNOSIS — R935 Abnormal findings on diagnostic imaging of other abdominal regions, including retroperitoneum: Secondary | ICD-10-CM

## 2020-07-22 DIAGNOSIS — F4310 Post-traumatic stress disorder, unspecified: Secondary | ICD-10-CM

## 2020-07-22 DIAGNOSIS — F410 Panic disorder [episodic paroxysmal anxiety] without agoraphobia: Secondary | ICD-10-CM

## 2020-07-22 DIAGNOSIS — R590 Localized enlarged lymph nodes: Secondary | ICD-10-CM

## 2020-07-22 NOTE — Telephone Encounter (Signed)
Contacted patient for Procedure lymph node bx. Patient will arrive at medical mall at [facility] to  the admitting and registration desk to check in on 8/26 date and 9am time.  Patient was instructed to be NPO after midnight the night before procedure and to have a driver to be able to take them home.  If there are any medications to take that am please take with sip of water. The following info was left on voicemail for pt and instructed to call back and confirm he got my message and if he has any questions can call back to cancer center.
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
## 2020-07-24 ENCOUNTER — Telehealth: Payer: Self-pay | Admitting: *Deleted

## 2020-07-24 NOTE — Telephone Encounter (Signed)
Called and spoke to Elma [facility] and she states that pt will have labs done tom. With his bx. Dr. Janese Banks requests that when he gets labs done at bx to add on labs that she wants at same time.  I went into encounter and added the labs on. Marcie Bal will let vicki know that to get additional labs

## 2020-07-25 ENCOUNTER — Other Ambulatory Visit: Payer: Self-pay

## 2020-07-25 ENCOUNTER — Ambulatory Visit
Admission: RE | Admit: 2020-07-25 | Discharge: 2020-07-25 | Disposition: A | Discharge status: Home / Self Care | Payer: Medicare HMO | Source: Ambulatory Visit | Attending: Oncology | Admitting: Oncology

## 2020-07-25 DIAGNOSIS — R590 Localized enlarged lymph nodes: Secondary | ICD-10-CM | POA: Diagnosis not present

## 2020-07-25 DIAGNOSIS — R935 Abnormal findings on diagnostic imaging of other abdominal regions, including retroperitoneum: Secondary | ICD-10-CM | POA: Insufficient documentation

## 2020-07-25 DIAGNOSIS — F17210 Nicotine dependence, cigarettes, uncomplicated: Secondary | ICD-10-CM | POA: Diagnosis not present

## 2020-07-25 DIAGNOSIS — Z7982 Long term (current) use of aspirin: Secondary | ICD-10-CM | POA: Diagnosis not present

## 2020-07-25 DIAGNOSIS — I10 Essential (primary) hypertension: Secondary | ICD-10-CM | POA: Diagnosis not present

## 2020-07-25 DIAGNOSIS — Z79899 Other long term (current) drug therapy: Secondary | ICD-10-CM | POA: Insufficient documentation

## 2020-07-25 DIAGNOSIS — Z7901 Long term (current) use of anticoagulants: Secondary | ICD-10-CM | POA: Diagnosis not present

## 2020-07-25 DIAGNOSIS — F329 Major depressive disorder, single episode, unspecified: Secondary | ICD-10-CM | POA: Diagnosis not present

## 2020-07-25 LAB — COMPREHENSIVE METABOLIC PANEL
ALT: 30 U/L (ref 0–44)
AST: 41 U/L (ref 15–41)
Albumin: 4.3 g/dL (ref 3.5–5.0)
Alkaline Phosphatase: 100 U/L (ref 38–126)
Anion gap: 14 (ref 5–15)
BUN: 15 mg/dL (ref 6–20)
CO2: 24 mmol/L (ref 22–32)
Calcium: 9.3 mg/dL (ref 8.9–10.3)
Chloride: 97 mmol/L — ABNORMAL LOW (ref 98–111)
Creatinine, Ser: 1.12 mg/dL (ref 0.61–1.24)
GFR calc Af Amer: >60 mL/min (ref >60)
GFR calc non Af Amer: >60 mL/min (ref >60)
Glucose, Bld: 103 mg/dL — ABNORMAL HIGH (ref 70–99)
Potassium: 4.2 mmol/L (ref 3.5–5.1)
Sodium: 135 mmol/L (ref 135–145)
Total Bilirubin: 1.5 mg/dL — ABNORMAL HIGH (ref 0.3–1.2)
Total Protein: 7.8 g/dL (ref 6.5–8.1)

## 2020-07-25 LAB — CBC WITH DIFFERENTIAL/PLATELET
Abs Immature Granulocytes: 0.03 10*3/uL (ref 0.00–0.07)
Basophils Absolute: 0.1 10*3/uL (ref 0.0–0.1)
Basophils Relative: 1 %
Eosinophils Absolute: 0.1 10*3/uL (ref 0.0–0.5)
Eosinophils Relative: 1 %
HCT: 53.7 % — ABNORMAL HIGH (ref 39.0–52.0)
Hemoglobin: 18.6 g/dL — ABNORMAL HIGH (ref 13.0–17.0)
Immature Granulocytes: 0 %
Lymphocytes Relative: 26 %
Lymphs Abs: 2.1 10*3/uL (ref 0.7–4.0)
MCH: 31.3 pg (ref 26.0–34.0)
MCHC: 34.6 g/dL (ref 30.0–36.0)
MCV: 90.3 fL (ref 80.0–100.0)
Monocytes Absolute: 1.2 10*3/uL — ABNORMAL HIGH (ref 0.1–1.0)
Monocytes Relative: 15 %
Neutro Abs: 4.6 10*3/uL (ref 1.7–7.7)
Neutrophils Relative %: 57 %
Platelets: 130 10*3/uL — ABNORMAL LOW (ref 150–400)
RBC: 5.95 MIL/uL — ABNORMAL HIGH (ref 4.22–5.81)
RDW: 14.1 % (ref 11.5–15.5)
WBC: 8.1 10*3/uL (ref 4.0–10.5)
nRBC: 0 % (ref 0.0–0.2)

## 2020-07-25 LAB — LACTATE DEHYDROGENASE: LDH: 182 U/L (ref 98–192)

## 2020-07-25 LAB — PROTIME-INR
INR: 1 (ref 0.8–1.2)
Prothrombin Time: 12.6 seconds (ref 11.4–15.2)

## 2020-07-25 MED ORDER — FENTANYL CITRATE (PF) 100 MCG/2ML IJ SOLN
INTRAMUSCULAR | Status: AC
Start: 2020-07-25 — End: 2020-07-25
  Filled 2020-07-25: qty 2

## 2020-07-25 MED ORDER — MIDAZOLAM HCL 2 MG/2ML IJ SOLN
INTRAMUSCULAR | Status: DC | PRN
  Administered 2020-07-25: 1 mg via INTRAVENOUS

## 2020-07-25 MED ORDER — FENTANYL CITRATE (PF) 100 MCG/2ML IJ SOLN
INTRAMUSCULAR | Status: DC | PRN
Start: 2020-07-25 — End: 2020-07-26
  Administered 2020-07-25: 50 ug via INTRAVENOUS

## 2020-07-25 MED ORDER — MIDAZOLAM HCL 2 MG/2ML IJ SOLN
INTRAMUSCULAR | Status: AC
  Filled 2020-07-25: qty 2

## 2020-07-25 NOTE — Discharge Instructions (Signed)
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
## 2020-07-25 NOTE — Procedures (Signed)
Interventional Radiology Procedure:   Indications: Lymphadenopathy  Procedure: US guided right axillary lymph node biopsy  Findings: Multiple cores from a prominent lymph node  Complications: None     EBL: less than 10 ml  Plan: Discharge to home.    Ellis Koffler R. Anselm Pancoast, MD  Pager: [phone]

## 2020-07-26 LAB — ANA COMPREHENSIVE PANEL
Anti JO-1: <0.2 AI (ref 0.0–0.9)
Centromere Ab Screen: <0.2 AI (ref 0.0–0.9)
Chromatin Ab SerPl-aCnc: 0.2 AI (ref 0.0–0.9)
ENA SM Ab Ser-aCnc: <0.2 AI (ref 0.0–0.9)
Ribonucleic Protein: 0.5 AI (ref 0.0–0.9)
SSA (Ro) (ENA) Antibody, IgG: <0.2 AI (ref 0.0–0.9)
SSB (La) (ENA) Antibody, IgG: <0.2 AI (ref 0.0–0.9)
Scleroderma (Scl-70) (ENA) Antibody, IgG: <0.2 AI (ref 0.0–0.9)
ds DNA Ab: <1 IU/mL (ref 0–9)

## 2020-07-29 DIAGNOSIS — H02884 Meibomian gland dysfunction left upper eyelid: Secondary | ICD-10-CM | POA: Diagnosis not present

## 2020-07-29 DIAGNOSIS — H02881 Meibomian gland dysfunction right upper eyelid: Secondary | ICD-10-CM | POA: Diagnosis not present

## 2020-07-30 ENCOUNTER — Telehealth: Payer: Self-pay | Admitting: *Deleted

## 2020-07-30 NOTE — Telephone Encounter (Signed)
Patient called asking for results of biopsy done last week. I let him know that the results are not back yet and confirmed his visit Thursday with Dr Janese Banks at 76 as a video visit. He thought that it was an in person visit and is fine with a video visit

## 2020-07-31 ENCOUNTER — Encounter: Payer: Self-pay | Admitting: Oncology

## 2020-08-01 ENCOUNTER — Inpatient Hospital Stay: Payer: Medicare HMO | Attending: Oncology | Admitting: Oncology

## 2020-08-01 ENCOUNTER — Telehealth: Payer: Self-pay | Admitting: Urology

## 2020-08-01 DIAGNOSIS — D751 Secondary polycythemia: Secondary | ICD-10-CM | POA: Diagnosis not present

## 2020-08-01 DIAGNOSIS — C8202 Follicular lymphoma grade I, intrathoracic lymph nodes: Secondary | ICD-10-CM

## 2020-08-01 MED ORDER — TESTOSTERONE CYPIONATE 200 MG/ML IM SOLN
80.0000 mg | INTRAMUSCULAR | 0 refills | Status: DC
Start: 2020-08-01 — End: 2021-02-20

## 2020-08-01 NOTE — Telephone Encounter (Signed)
Notified patient as instructed, patient pleased. Discussed follow-up appointments, patient agrees  

## 2020-08-01 NOTE — Telephone Encounter (Signed)
Dr. Janese Banks from oncology called requesting testosterone dose be lowered due to increase RBCs.  Recommend dose change to 0.4 cc weekly

## 2020-08-02 ENCOUNTER — Ambulatory Visit: Payer: Medicare HMO | Admitting: Cardiology

## 2020-08-02 DIAGNOSIS — F1010 Alcohol abuse, uncomplicated: Secondary | ICD-10-CM | POA: Diagnosis not present

## 2020-08-02 DIAGNOSIS — F410 Panic disorder [episodic paroxysmal anxiety] without agoraphobia: Secondary | ICD-10-CM | POA: Diagnosis not present

## 2020-08-02 DIAGNOSIS — F3162 Bipolar disorder, current episode mixed, moderate: Secondary | ICD-10-CM | POA: Diagnosis not present
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
## 2020-08-07 ENCOUNTER — Other Ambulatory Visit: Payer: Self-pay | Admitting: *Deleted

## 2020-08-07 ENCOUNTER — Telehealth: Payer: Self-pay | Admitting: *Deleted

## 2020-08-07 NOTE — Telephone Encounter (Signed)
Pt called back and said that the early am appt will not work. He can keep it the same day but a  Later in the day. I called and moved it to 1 pm and called pt to let him know about the new time and went over instructions and how to get there and he knows how to get to the [facility]

## 2020-08-07 NOTE — Telephone Encounter (Signed)
Called the pt. And got voicemail and left message that dr Janese Banks wants him to have abd/pelvis in next week or two. I have made the appt.for the scan at [facility] and it is for 9/15 at 9 am. Arrive 15 min early. NPO x 4 hours prior to appt and need to pick up prep kit prior to the appt. Asked him to call me back and I can speak to him in person over phone

## 2020-08-09 ENCOUNTER — Other Ambulatory Visit: Payer: Self-pay

## 2020-08-09 ENCOUNTER — Ambulatory Visit (INDEPENDENT_AMBULATORY_CARE_PROVIDER_SITE_OTHER): Payer: Medicare HMO

## 2020-08-09 DIAGNOSIS — R55 Syncope and collapse: Secondary | ICD-10-CM | POA: Diagnosis not present

## 2020-08-09 LAB — ECHOCARDIOGRAM COMPLETE
AR max vel: 2.99 cm2
AV Area VTI: 3.04 cm2
AV Area mean vel: 2.95 cm2
AV Mean grad: 4 mmHg
AV Peak grad: 6.9 mmHg
Ao pk vel: 1.31 m/s
Area-P 1/2: 5.16 cm2
Calc EF: 51.3 %
S' Lateral: 3.2 cm
Single Plane A2C EF: 50.8 %
Single Plane A4C EF: 52.3 %

## 2020-08-12 ENCOUNTER — Ambulatory Visit: Payer: PRIVATE HEALTH INSURANCE | Admitting: Urology

## 2020-08-14 ENCOUNTER — Ambulatory Visit
Admission: RE | Admit: 2020-08-14 | Discharge: 2020-08-14 | Disposition: A | Discharge status: Home / Self Care | Payer: Medicare HMO | Source: Ambulatory Visit | Attending: Oncology | Admitting: Oncology

## 2020-08-14 ENCOUNTER — Ambulatory Visit: Payer: Medicare HMO

## 2020-08-14 ENCOUNTER — Other Ambulatory Visit: Payer: Self-pay

## 2020-08-14 DIAGNOSIS — C8202 Follicular lymphoma grade I, intrathoracic lymph nodes: Secondary | ICD-10-CM | POA: Diagnosis not present

## 2020-08-14 DIAGNOSIS — N281 Cyst of kidney, acquired: Secondary | ICD-10-CM | POA: Diagnosis not present

## 2020-08-14 DIAGNOSIS — M47816 Spondylosis without myelopathy or radiculopathy, lumbar region: Secondary | ICD-10-CM | POA: Diagnosis not present

## 2020-08-14 DIAGNOSIS — K5730 Diverticulosis of large intestine without perforation or abscess without bleeding: Secondary | ICD-10-CM | POA: Diagnosis not present

## 2020-08-14 MED ORDER — IOHEXOL 300 MG/ML  SOLN
100.0000 mL | Freq: Once | INTRAMUSCULAR | Status: AC | PRN
  Administered 2020-08-14: 100 mL via INTRAVENOUS

## 2020-08-18 NOTE — Progress Notes (Signed)
08/19/2020 3:13 PM   [name] 14-Mar-1961 [id number]  Referring provider: Trinna Post, PA-C [address] [place],  [place] [address] Chief Complaint  Patient presents with  . Hypogonadism   Urologic history:  1.  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  Erectile dysfunction -On sildenafil  3.  BPH with lower urinary tract symptoms -Combination therapy finasteride/tamsulosin  HPI: Russell Simpson is a 59 y.o. male who presents today for a 1 year follow up of hypogonadotrophic hypogonadism, erectile dysfunction and BPH with lower urinary tract symptoms.   -Mos recent PSA was 2.2 and testosterone 666 on 02/07/20. -Hematocrit was 53.7 as of 07/25/20.  -CT A/P w/ contrast on 08/14/20 noted widespread mild to moderate lymphadenopathy in the retroperitoneum and bilateral pelvis compatible with lymphoproliferative disorder.  Followed by oncology -His remains on testosterone cypionate 100 mg weekly. Last injection was last Tuesday. -He had cut back on injections due to abnormal labs with Dr. Janese Banks. -He is having trouble with erections and masturbation since being placed on anti-depressants.  -Noted worsening ED and request trial of a different PDE 5 inhibitor  Patient is followed by Dr. 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   PMH: Past Medical History:  Diagnosis Date  . 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 Arthritis   . Asthma   . Brain tumor (benign) ([place])    benign pituitary neoplasm  . Chronic pain    right arm  . Depression   . Environmental and seasonal allergies   . Hypertension   . Pneumonia   . Sleep apnea    does not wear CPAP ; uses humidifier instead    Surgical History: Past Surgical History:  Procedure Laterality Date  . ANTERIOR CERVICAL  DECOMP/DISCECTOMY FUSION N/A 06/17/2016   Procedure: ANTERIOR CERVICAL DECOMPRESSION FUSION, CERVICAL 3-4, CERVICAL 4-5 WITH INSTRUMENTATION AND ALLOGRAFT;  Surgeon: Phylliss Bob, MD;  Location: [facility];  Service: Orthopedics;  Laterality: N/A;  ANTERIOR CERVICAL DECOMPRESSION FUSION, CERVICAL 3-4, CERVICAL 4-5 WITH INSTRUMENTATION AND ALLOGRAFT  . BACK SURGERY    . NECK SURGERY  2009    Home Medications:  Allergies as of 08/19/2020      Reactions   Penicillins Anaphylaxis   Tetanus Toxoids Swelling   Lisinopril    Other reaction(s): Other (See Comments) Other reaction(s): Cough Other reaction(s): Cough Other reaction(s): Cough Other reaction(s): Cough   Losartan    Other reaction(s): Other (See Comments) Other reaction(s): Muscle Pain Other reaction(s): Muscle Pain Other reaction(s): Muscle Pain   Tetanus Toxoid    Codeine Nausea Only   Doxycycline Rash      Medication List       Accurate as of August 19, 2020  3:13 PM. If you have any questions, ask your nurse or doctor.        amLODipine 5 MG tablet Commonly known as: NORVASC Take 1 tablet (5 mg total) by mouth daily.   Arginine 2000 MG Pack Take by mouth daily.   aspirin EC 81 MG tablet Take 81 mg by mouth daily. Swallow whole.   Breztri Aerosphere 160-9-4.8 MCG/ACT Aero Generic drug: Budeson-Glycopyrrol-Formoterol Inhale 2 puffs into the lungs 2 (two) times daily.   DULoxetine 60 MG capsule Commonly known as: CYMBALTA TAKE 1 CAPSULE BY MOUTH ONCE DAILY   Eucrisa 2 % Oint Generic drug: Crisaborole Apply  a small amount to affected  area   qd/bid to aa's chest, inframammary, eye   famotidine 20 MG tablet Commonly known as: PEPCID TAKE 1 TABLET BY MOUTH TWICE DAILY   finasteride 5 MG tablet Commonly known as: PROSCAR TAKE 1 TABLET BY MOUTH ONCE DAILY   fluticasone 50 MCG/ACT nasal spray Commonly known as: FLONASE Place 1 spray into the nose daily.   lansoprazole 30 MG capsule Commonly known as:  PREVACID Take 1 capsule (30 mg total) by mouth daily at 12 noon.   loratadine 10 MG tablet Commonly known as: CLARITIN Take 10 mg by mouth daily.   metoprolol succinate 25 MG 24 hr tablet Commonly known as: TOPROL-XL TAKE 1 TABLET BY MOUTH ONCE DAILY   montelukast 10 MG tablet Commonly known as: SINGULAIR TAKE 1 TABLET BY MOUTH AT BEDTIME   naproxen sodium 220 MG tablet Commonly known as: ALEVE Take 220 mg by mouth 2 (two) times daily with a meal.   OLANZapine 7.5 MG tablet Commonly known as: ZYPREXA Take 7.5 mg by mouth at bedtime.   omeprazole 20 MG capsule Commonly known as: PRILOSEC Take 1 capsule (20 mg total) by mouth 2 (two) times daily before a meal.   OVER THE COUNTER MEDICATION OREGA MAX   OVER THE COUNTER MEDICATION RED MARINE ALGAE-375 MG.   sildenafil 20 MG tablet Commonly known as: REVATIO 3-5 tablets daily as needed for erectile dysfunction   tacrolimus 0.1 % ointment Commonly known as: PROTOPIC Apply 1 application topically 2 (two) times daily.   testosterone cypionate 200 MG/ML injection Commonly known as: DEPOTESTOSTERONE CYPIONATE Inject 0.4 mLs (80 mg total) into the muscle once a week.       Allergies:  Allergies  Allergen Reactions  . Penicillins Anaphylaxis  . Tetanus Toxoids Swelling  . Lisinopril     Other reaction(s): Other (See Comments) Other reaction(s): Cough Other reaction(s): Cough Other reaction(s): Cough Other reaction(s): Cough  . Losartan     Other reaction(s): Other (See Comments) Other reaction(s): Muscle Pain Other reaction(s): Muscle Pain Other reaction(s): Muscle Pain  . Tetanus Toxoid   . Codeine Nausea Only  . Doxycycline Rash    Family History: Family History  Problem Relation Age of Onset  . Diabetes Mother   . Diabetes Other     Social History:  reports that he has been smoking cigars. He has smoked for the past 20.00 years. He has never used smokeless tobacco. He reports previous alcohol use. He  reports that he does not use drugs.   Physical Exam: BP 115/83   Pulse (!) 118   Ht 6\' 2"  (1.88 m)   Wt 243 lb (110.2 kg)   BMI 31.20 kg/m   Constitutional:  Alert and oriented, No acute distress. HEENT: [place] AT, moist mucus membranes.  Trachea midline, no masses. Cardiovascular: No clubbing, cyanosis, or edema. Respiratory: Normal respiratory effort, no increased work of breathing. GI: Abdomen is soft, nontender, nondistended, no abdominal masses GU: No CVA tenderness Lymph: No cervical or inguinal lymphadenopathy. Rectal: Normal sphincter tone, 35 prostate, smooth no nodules.  Skin: No rashes, bruises or suspicious lesions. Neurologic: Grossly intact, no focal deficits, moving all 4 extremities. Psychiatric: Normal mood and affect.  Laboratory Data:  Lab Results  Component Value Date   CREATININE 1.12 07/25/2020    Lab Results  Component Value Date   TESTOSTERONE 666 02/07/2020    Assessment & Plan:    1. Hypogonadotrophic hypogonadism  Stable symptoms on TRT.  PSA, testosterone and hematocrit today.  2.  Erectile  dysfunction - Cialis Rx sent.   3. BPH with lower urinary tract symptoms  Stable voiding symptoms on combination therapy.    [facility] [address] [place], [place] [address] [phone]  I, Selena Batten, am acting as a scribe for Dr. Nicki Reaper C. Keion Neels,  I have reviewed the above documentation for accuracy and completeness, and I agree with the above.   Abbie Sons, MD

## 2020-08-19 ENCOUNTER — Other Ambulatory Visit: Payer: Self-pay

## 2020-08-19 ENCOUNTER — Ambulatory Visit (INDEPENDENT_AMBULATORY_CARE_PROVIDER_SITE_OTHER): Payer: Medicare HMO | Admitting: Urology

## 2020-08-19 VITALS — BP 115/83 | HR 118 | Ht 74.0 in | Wt 243.0 lb

## 2020-08-19 DIAGNOSIS — E291 Testicular hypofunction: Secondary | ICD-10-CM

## 2020-08-19 DIAGNOSIS — N5201 Erectile dysfunction due to arterial insufficiency: Secondary | ICD-10-CM | POA: Diagnosis not present

## 2020-08-19 DIAGNOSIS — N401 Enlarged prostate with lower urinary tract symptoms: Secondary | ICD-10-CM | POA: Diagnosis not present

## 2020-08-20 LAB — HEMATOCRIT: Hematocrit: 53.3 % — ABNORMAL HIGH (ref 37.5–51.0)

## 2020-08-20 LAB — PSA: Prostate Specific Ag, Serum: 3.2 ng/mL (ref 0.0–4.0)

## 2020-08-20 LAB — TESTOSTERONE: Testosterone: 426 ng/dL (ref 264–916)

## 2020-08-21 ENCOUNTER — Encounter: Payer: Self-pay | Admitting: Oncology

## 2020-08-21 LAB — SURGICAL PATHOLOGY

## 2020-08-22 ENCOUNTER — Telehealth (INDEPENDENT_AMBULATORY_CARE_PROVIDER_SITE_OTHER): Payer: Medicare HMO | Admitting: Cardiology

## 2020-08-22 ENCOUNTER — Encounter: Payer: Self-pay | Admitting: Cardiology

## 2020-08-22 ENCOUNTER — Telehealth: Payer: Self-pay | Admitting: Cardiology

## 2020-08-22 VITALS — Ht 74.0 in | Wt 243.0 lb

## 2020-08-22 DIAGNOSIS — F17200 Nicotine dependence, unspecified, uncomplicated: Secondary | ICD-10-CM

## 2020-08-22 DIAGNOSIS — R55 Syncope and collapse: Secondary | ICD-10-CM

## 2020-08-22 DIAGNOSIS — E7800 Pure hypercholesterolemia, unspecified: Secondary | ICD-10-CM | POA: Diagnosis not present

## 2020-08-22 DIAGNOSIS — I10 Essential (primary) hypertension: Secondary | ICD-10-CM | POA: Diagnosis not present

## 2020-08-22 MED ORDER — ATORVASTATIN CALCIUM 40 MG PO TABS: 40.0000 mg | ORAL_TABLET | Freq: Every day | ORAL | 5 refills | Status: DC

## 2020-08-22 NOTE — Progress Notes (Signed)
Virtual Visit via Telephone Note   This visit type was conducted due to national recommendations for restrictions regarding the COVID-19 Pandemic (e.g. social distancing) in an effort to limit this patient's exposure and mitigate transmission in our community.  Due to his co-morbid illnesses, this patient is at least at moderate risk for complications without adequate follow up.  This format is felt to be most appropriate for this patient at this time.  The patient did not have access to video technology/had technical difficulties with video requiring transitioning to audio format only (telephone).  All issues noted in this document were discussed and addressed.  No physical exam could be performed with this format.  Please refer to the patient's chart for his  consent to telehealth for [facility].   Date:  08/22/2020   ID:  Russell Simpson, DOB 27-Apr-1961, MRN [id number]  Patient Location: Home Provider Location: Home Office  PCP:  Trinna Post, PA-C  Cardiologist:  Kate Sable, MD  Electrophysiologist:  None   Evaluation Performed:  Follow-Up Visit  Chief Complaint: Coughing, follow-up visit  History of Present Illness:    Russell Simpson is a 59 y.o. male with history of hypertension, asthma, current smoker x15 years who presents for follow-up.  Patient previously seen due to syncopal events after bouts of coughing.  Etiology was deemed likely due to cough induced syncope.  However, cardiac monitor and echocardiogram was ordered to evaluate any structural abnormalities or cardiac causes of syncope.  All his syncopal events have been related with coughing.  He states his coughing is still there but he is passing out episodes have improved.  He is scheduled/plan to see pulmonary medicine.  The patient does not have symptoms concerning for COVID-19 infection (fever, chills, cough, or new shortness of breath).    Past Medical History:  Diagnosis Date  . Anterior pituitary  disorder ([place])   . Arthritis   . Asthma   . Brain tumor (benign) ([facility])    benign pituitary neoplasm  . Chronic pain    right arm  . Depression   . Environmental and seasonal allergies   . Hypertension   . Pneumonia   . Sleep apnea    does not wear CPAP ; uses humidifier instead   Past Surgical History:  Procedure Laterality Date  . ANTERIOR CERVICAL DECOMP/DISCECTOMY FUSION N/A 06/17/2016   Procedure: ANTERIOR CERVICAL DECOMPRESSION FUSION, CERVICAL 3-4, CERVICAL 4-5 WITH INSTRUMENTATION AND ALLOGRAFT;  Surgeon: Phylliss Bob, MD;  Location: [facility];  Service: Orthopedics;  Laterality: N/A;  ANTERIOR CERVICAL DECOMPRESSION FUSION, CERVICAL 3-4, CERVICAL 4-5 WITH INSTRUMENTATION AND ALLOGRAFT  . BACK SURGERY    . NECK SURGERY  2009     Current Meds  Medication Sig  . amLODipine (NORVASC) 5 MG tablet Take 1 tablet (5 mg total) by mouth daily.  . Arginine 2000 MG PACK Take by mouth daily.  [name] aspirin EC 81 MG tablet Take 81 mg by mouth daily. Swallow whole.  . Budeson-Glycopyrrol-Formoterol (BREZTRI AEROSPHERE) 160-9-4.8 MCG/ACT AERO Inhale 2 puffs into the lungs 2 (two) times daily.  . DULoxetine (CYMBALTA) 60 MG capsule TAKE 1 CAPSULE BY MOUTH ONCE DAILY  . EUCRISA 2 % OINT Apply  a small amount to affected area   qd/bid to aa's chest, inframammary, eye  . famotidine (PEPCID) 20 MG tablet TAKE 1 TABLET BY MOUTH TWICE DAILY  . finasteride (PROSCAR) 5 MG tablet TAKE 1 TABLET BY MOUTH ONCE DAILY  . fluticasone (FLONASE) 50 MCG/ACT nasal spray Place  1 spray into the nose daily.   . lansoprazole (PREVACID) 30 MG capsule Take 1 capsule (30 mg total) by mouth daily at 12 noon.  . loratadine (CLARITIN) 10 MG tablet Take 10 mg by mouth daily.   . metoprolol succinate (TOPROL-XL) 25 MG 24 hr tablet TAKE 1 TABLET BY MOUTH ONCE DAILY  . montelukast (SINGULAIR) 10 MG tablet TAKE 1 TABLET BY MOUTH AT BEDTIME  . naproxen sodium (ANAPROX) 220 MG tablet Take 220 mg by mouth 2 (two) times daily with a  meal.  . OLANZapine (ZYPREXA) 7.5 MG tablet Take 7.5 mg by mouth at bedtime.  [name] omeprazole (PRILOSEC) 20 MG capsule Take 1 capsule (20 mg total) by mouth 2 (two) times daily before a meal.  . OVER THE COUNTER MEDICATION OREGA MAX  . OVER THE COUNTER MEDICATION RED MARINE ALGAE-375 MG.  . sildenafil (REVATIO) 20 MG tablet 3-5 tablets daily as needed for erectile dysfunction  . tacrolimus (PROTOPIC) 0.1 % ointment Apply 1 application topically 2 (two) times daily.  [name] testosterone cypionate (DEPOTESTOSTERONE CYPIONATE) 200 MG/ML injection Inject 0.4 mLs (80 mg total) into the muscle once a week.     Allergies:   Penicillins, Tetanus toxoids, Lisinopril, Losartan, Tetanus toxoid, Codeine, and Doxycycline   Social History   Tobacco Use  . Smoking status: Current Every Day Smoker    Years: 20.00    Types: Cigars  . Smokeless tobacco: Never Used  Vaping Use  . Vaping Use: Former  . Start date: 04/30/2017  . Quit date: 11/30/2017  Substance Use Topics  . Alcohol use: Not Currently    Alcohol/week: 0.0 standard drinks    Comment: 12 pack a beer a night  . Drug use: No     Family Hx: The patient's family history includes Diabetes in his mother and another family member.  ROS:   Please see the history of present illness.     All other systems reviewed and are negative.   Prior CV studies:   The following studies were reviewed today:  Cardiac monitor, echocardiogram  Labs/Other Tests and Data Reviewed:    EKG:  No ECG reviewed.  Recent Labs: 07/25/2020: ALT 30; BUN 15; Creatinine, Ser 1.12; Hemoglobin 18.6; Platelets 130; Potassium 4.2; Sodium 135   Recent Lipid Panel Lab Results  Component Value Date/Time   CHOL 255 (H) 02/14/2019 03:13 PM   TRIG 155 (H) 02/14/2019 03:13 PM   HDL 53 02/14/2019 03:13 PM   CHOLHDL 4.8 02/14/2019 03:13 PM   CHOLHDL 2.6 10/15/2017 09:06 AM   LDLCALC 171 (H) 02/14/2019 03:13 PM   LDLCALC 108 (H) 10/15/2017 09:06 AM    Wt Readings from Last 3  Encounters:  08/22/20 243 lb (110.2 kg)  08/19/20 243 lb (110.2 kg)  07/25/20 243 lb (110.2 kg)     Objective:    Vital Signs:  Ht 6\' 2"  (1.88 m)   Wt 243 lb (110.2 kg)   BMI 31.20 kg/m    VITAL SIGNS:  reviewed  ASSESSMENT & PLAN:    1. Episodes of syncope after coughing.  This is consistent with cough induced syncope.  Cardiac monitor did not show any significant arrhythmias, no high degree AV block noted.  Echocardiogram on 07/2020 showed normal systolic function, EF 60 to 65%, impaired relaxation.  No gross structural abnormalities to suggest etiology for syncope as cardiac.  Recommend patient follows up with pulmonary medicine regarding management of cough as I believe, this will help with his syncope. 2. Patient is  a current smoker, smoking cessation advised 3. History of hypertension, continue current BP meds. 4. History of hyperlipidemia, 10-year ASCVD risk 17%.  Start Lipitor 40 mg daily.  Get lipid profile prior to follow-up in 6 months.   Follow-up in 6 months.  COVID-19 Education: The signs and symptoms of COVID-19 were discussed with the patient and how to seek care for testing (follow up with PCP or arrange E-visit).  The importance of social distancing was discussed today.  Time:   Today, I have spent 40 minutes with the patient with telehealth technology discussing the above problems.     Medication Adjustments/Labs and Tests Ordered: Current medicines are reviewed at length with the patient today.  Concerns regarding medicines are outlined above.   Tests Ordered: Orders Placed This Encounter  Procedures  . Lipid panel    Medication Changes: Meds ordered this encounter  Medications  . atorvastatin (LIPITOR) 40 MG tablet    Sig: Take 1 tablet (40 mg total) by mouth daily.    Dispense:  30 tablet    Refill:  5    Follow Up:  In Person in 6 month(s)  Signed, Kate Sable, MD  08/22/2020 12:42 PM    [facility]

## 2020-08-22 NOTE — Patient Instructions (Signed)
Medication Instructions:  Your physician has recommended you make the following change in your medication:   START taking atorvastatin (LIPITOR) 40 MG tablet: Take 1 tablet (40 mg total) by mouth daily.   *If you need a refill on your cardiac medications before your next appointment, please call your pharmacy*   Lab Work:  Your physician recommends that you return for lab work in: one week prior to 6 month follow up. - You will need to be fasting. Please do not have anything to eat or drink after midnight the morning you have the lab work. You may only have water or black coffee with no cream or sugar. - Please go to the [facility]. You will check in at the front desk to the right as you walk into the atrium. Valet Parking is offered if needed. - No appointment needed. You may go any day between 7 am and 6 pm.   Testing/Procedures: None Ordered   Follow-Up: At [facility], you and your health needs are our priority.  As part of our continuing mission to provide you with exceptional heart care, we have created designated Provider Care Teams.  These Care Teams include your primary Cardiologist (physician) and Advanced Practice Providers (APPs -  Physician Assistants and Nurse Practitioners) who all work together to provide you with the care you need, when you need it.  We recommend signing up for the patient portal called "MyChart".  Sign up information is provided on this After Visit Summary.  MyChart is used to connect with patients for Virtual Visits (Telemedicine).  Patients are able to view lab/test results, encounter notes, upcoming appointments, etc.  Non-urgent messages can be sent to your provider as well.   To learn more about what you can do with MyChart, go to [email or web address].    Your next appointment:   6 month(s)  The format for your next appointment:   In Person  Provider:   Kate Sable, MD   Other Instructions

## 2020-08-22 NOTE — Telephone Encounter (Signed)
  Patient Consent for Virtual Visit         Russell Simpson has provided verbal consent on 08/22/2020 for a virtual visit (video or telephone).   CONSENT FOR VIRTUAL VISIT FOR:  Russell Simpson  By participating in this virtual visit I agree to the following:  I hereby voluntarily request, consent and authorize [facility] and its employed or contracted physicians, physician assistants, nurse practitioners or other licensed health care professionals (the Practitioner), to provide me with telemedicine health care services (the "Services") as deemed necessary by the treating Practitioner. I acknowledge and consent to receive the Services by the Practitioner via telemedicine. I understand that the telemedicine visit will involve communicating with the Practitioner through live audiovisual communication technology and the disclosure of certain medical information by electronic transmission. I acknowledge that I have been given the opportunity to request an in-person assessment or other available alternative prior to the telemedicine visit and am voluntarily participating in the telemedicine visit.  I understand that I have the right to withhold or withdraw my consent to the use of telemedicine in the course of my care at any time, without affecting my right to future care or treatment, and that the Practitioner or I may terminate the telemedicine visit at any time. I understand that I have the right to inspect all information obtained and/or recorded in the course of the telemedicine visit and may receive copies of available information for a reasonable fee.  I understand that some of the potential risks of receiving the Services via telemedicine include:  [name] Delay or interruption in medical evaluation due to technological equipment failure or disruption; . Information transmitted may not be sufficient (e.g. poor resolution of images) to allow for appropriate medical decision making by the Practitioner;  and/or  . In rare instances, security protocols could fail, causing a breach of personal health information.  Furthermore, I acknowledge that it is my responsibility to provide information about my medical history, conditions and care that is complete and accurate to the best of my ability. I acknowledge that Practitioner's advice, recommendations, and/or decision may be based on factors not within their control, such as incomplete or inaccurate data provided by me or distortions of diagnostic images or specimens that may result from electronic transmissions. I understand that the practice of medicine is not an exact science and that Practitioner makes no warranties or guarantees regarding treatment outcomes. I acknowledge that a copy of this consent can be made available to me via my patient portal ([facility]), or I can request a printed copy by calling the office of [facility].    I understand that my insurance will be billed for this visit.   I have read or had this consent read to me. . I understand the contents of this consent, which adequately explains the benefits and risks of the Services being provided via telemedicine.  . I have been provided ample opportunity to ask questions regarding this consent and the Services and have had my questions answered to my satisfaction. . I give my informed consent for the services to be provided through the use of telemedicine in my medical care

## 2020-08-23 ENCOUNTER — Encounter: Payer: Self-pay | Admitting: Urology

## 2020-08-23 ENCOUNTER — Telehealth: Payer: Self-pay | Admitting: *Deleted

## 2020-08-23 MED ORDER — TADALAFIL 20 MG PO TABS: ORAL_TABLET | ORAL | 0 refills | Status: DC

## 2020-08-23 NOTE — Telephone Encounter (Signed)
-----   Message from Abbie Sons, MD sent at 08/23/2020  3:10 PM EDT ----- Testosterone level is 426. PSA slightly higher at 3.2. Hematocrit remains elevated at 53.3. Repeat PSA 6 months with other 57-month labs

## 2020-08-23 NOTE — Telephone Encounter (Signed)
Notified patient as instructed, patient pleased. Discussed follow-up appointments, patient agrees  

## 2020-09-02 ENCOUNTER — Inpatient Hospital Stay: Payer: Medicare HMO | Attending: Oncology

## 2020-09-02 ENCOUNTER — Other Ambulatory Visit: Payer: Self-pay

## 2020-09-02 DIAGNOSIS — D45 Polycythemia vera: Secondary | ICD-10-CM | POA: Insufficient documentation

## 2020-09-02 DIAGNOSIS — E291 Testicular hypofunction: Secondary | ICD-10-CM | POA: Diagnosis not present

## 2020-09-02 DIAGNOSIS — D751 Secondary polycythemia: Secondary | ICD-10-CM

## 2020-09-02 LAB — CBC WITH DIFFERENTIAL/PLATELET
Abs Immature Granulocytes: 0.04 10*3/uL (ref 0.00–0.07)
Basophils Absolute: 0.1 10*3/uL (ref 0.0–0.1)
Basophils Relative: 1 %
Eosinophils Absolute: 0.1 10*3/uL (ref 0.0–0.5)
Eosinophils Relative: 1 %
HCT: 49.4 % (ref 39.0–52.0)
Hemoglobin: 17.7 g/dL — ABNORMAL HIGH (ref 13.0–17.0)
Immature Granulocytes: 1 %
Lymphocytes Relative: 28 %
Lymphs Abs: 2.1 10*3/uL (ref 0.7–4.0)
MCH: 31.5 pg (ref 26.0–34.0)
MCHC: 35.8 g/dL (ref 30.0–36.0)
MCV: 87.9 fL (ref 80.0–100.0)
Monocytes Absolute: 0.9 10*3/uL (ref 0.1–1.0)
Monocytes Relative: 12 %
Neutro Abs: 4.4 10*3/uL (ref 1.7–7.7)
Neutrophils Relative %: 57 %
Platelets: 141 10*3/uL — ABNORMAL LOW (ref 150–400)
RBC: 5.62 MIL/uL (ref 4.22–5.81)
RDW: 12.7 % (ref 11.5–15.5)
WBC: 7.5 10*3/uL (ref 4.0–10.5)
nRBC: 0 % (ref 0.0–0.2)

## 2020-09-03 LAB — TESTOSTERONE: Testosterone: 430 ng/dL (ref 264–916)

## 2020-09-03 LAB — ERYTHROPOIETIN: Erythropoietin: 16.8 m[IU]/mL (ref 2.6–18.5)

## 2020-09-04 LAB — JAK2 GENOTYPR

## 2020-09-06 ENCOUNTER — Other Ambulatory Visit: Payer: Self-pay | Admitting: Physician Assistant

## 2020-09-06 DIAGNOSIS — R059 Cough, unspecified: Secondary | ICD-10-CM

## 2020-09-06 DIAGNOSIS — K219 Gastro-esophageal reflux disease without esophagitis: Secondary | ICD-10-CM

## 2020-09-06 NOTE — Telephone Encounter (Signed)
Requested Prescriptions  Pending Prescriptions Disp Refills  . famotidine (PEPCID) 20 MG tablet [Pharmacy Med Name: FAMOTIDINE 20 MG TAB] 180 tablet 0    Sig: TAKE 1 TABLET BY MOUTH TWICE DAILY     Gastroenterology:  H2 Antagonists Passed - 09/06/2020  1:24 PM      Passed - Valid encounter within last 12 months    Recent Outpatient Visits          3 months ago Mixed bipolar I disorder [facility])   [place], [place], PA-C   5 months ago Cough   [facility], Vickki Muff, [place]   7 months ago [place], [place], [place]   9 months ago Lymphadenopathy   [place], [place], [place]   9 months ago Cough   [place], Clearnce Sorrel, [place]      Future Appointments            In 6 days Tyler Pita, MD [place]   In 11 months [place], Ronda Fairly, [facility]

## 2020-09-12 ENCOUNTER — Encounter: Payer: Self-pay | Admitting: Pulmonary Disease

## 2020-09-12 ENCOUNTER — Ambulatory Visit: Payer: Medicare HMO | Admitting: Pulmonary Disease

## 2020-09-12 ENCOUNTER — Other Ambulatory Visit: Payer: Self-pay | Admitting: Urology

## 2020-09-12 ENCOUNTER — Other Ambulatory Visit: Payer: Self-pay

## 2020-09-12 VITALS — BP 126/86 | HR 99 | Temp 97.5°F | Ht 74.0 in | Wt 235.4 lb

## 2020-09-12 DIAGNOSIS — K219 Gastro-esophageal reflux disease without esophagitis: Secondary | ICD-10-CM | POA: Diagnosis not present

## 2020-09-12 DIAGNOSIS — R059 Cough, unspecified: Secondary | ICD-10-CM | POA: Diagnosis not present

## 2020-09-12 DIAGNOSIS — J449 Chronic obstructive pulmonary disease, unspecified: Secondary | ICD-10-CM | POA: Diagnosis not present

## 2020-09-12 DIAGNOSIS — Z23 Encounter for immunization: Secondary | ICD-10-CM | POA: Diagnosis not present

## 2020-09-12 DIAGNOSIS — F17200 Nicotine dependence, unspecified, uncomplicated: Secondary | ICD-10-CM | POA: Diagnosis not present

## 2020-09-12 NOTE — Patient Instructions (Signed)
You will receive flu vaccine today  Continue Breztri 2 puffs twice a day  Continue Prevacid for reflux  See you in follow-up in 4 months time call sooner should any new problems arise.

## 2020-09-12 NOTE — Progress Notes (Signed)
Subjective:    Patient ID: Russell Simpson, male    DOB: 21-Dec-1960, 59 y.o.   MRN: [id number]  HPI Russell Simpson is a 59 year old current smoker (little cigars daily) who follows here for the issue of cough.  He was initially evaluated on 25 June 2020.  He had issues with severe cough at times causing syncope.  He was having issues with severe gastroesophageal reflux.  At his initial visit we instituted a trial of Breztri 2 inhalations twice a day, PFTs were obtained to have confirmed moderate obstructive defect consistent with moderate COPD.  He had no bronchodilator response.  He was given lansoprazole for his GERD.  Notices that this has helped.  Today he presents stating that he is feeling markedly better.  He has hardly had any cough.  No further syncopal episodes.  He unfortunately continues to engage in smoking to small cigars.  He is smoking anywhere between 6 to 8 cigars a day.  He does inhale.  He has not had any fevers, chills or sweats.  No dyspnea.  No hemoptysis.  No other concerns or complaints.  He feels well and looks well.   Review of Systems A 10 point review of systems was performed and it is as noted above otherwise negative.  Allergies  Allergen Reactions  . Penicillins Anaphylaxis  . Tetanus Toxoids Swelling  . Lisinopril     Other reaction(s): Other (See Comments) Other reaction(s): Cough Other reaction(s): Cough Other reaction(s): Cough Other reaction(s): Cough  . Losartan     Other reaction(s): Other (See Comments) Other reaction(s): Muscle Pain Other reaction(s): Muscle Pain Other reaction(s): Muscle Pain  . Tetanus Toxoid   . Codeine Nausea Only  . Doxycycline Rash   Current Meds  Medication Sig  . amLODipine (NORVASC) 5 MG tablet Take 1 tablet (5 mg total) by mouth daily.  . Arginine 2000 MG PACK Take by mouth daily.  [name] aspirin EC 81 MG tablet Take 81 mg by mouth daily. Swallow whole.  [name] atorvastatin (LIPITOR) 40 MG tablet Take 1 tablet (40 mg total) by mouth  daily.  . Budeson-Glycopyrrol-Formoterol (BREZTRI AEROSPHERE) 160-9-4.8 MCG/ACT AERO Inhale 2 puffs into the lungs 2 (two) times daily.  . clonazePAM (KLONOPIN) 0.5 MG tablet   . DULoxetine (CYMBALTA) 60 MG capsule TAKE 1 CAPSULE BY MOUTH ONCE DAILY  . EUCRISA 2 % OINT Apply  a small amount to affected area   qd/bid to aa's chest, inframammary, eye  . famotidine (PEPCID) 20 MG tablet TAKE 1 TABLET BY MOUTH TWICE DAILY  . fluticasone (FLONASE) 50 MCG/ACT nasal spray Place 1 spray into the nose daily.   . lansoprazole (PREVACID) 30 MG capsule Take 1 capsule (30 mg total) by mouth daily at 12 noon.  . loratadine (CLARITIN) 10 MG tablet Take 10 mg by mouth daily.   . metoprolol succinate (TOPROL-XL) 25 MG 24 hr tablet TAKE 1 TABLET BY MOUTH ONCE DAILY  . montelukast (SINGULAIR) 10 MG tablet TAKE 1 TABLET BY MOUTH AT BEDTIME  . naproxen sodium (ANAPROX) 220 MG tablet Take 220 mg by mouth 2 (two) times daily with a meal.  . OLANZapine (ZYPREXA) 7.5 MG tablet Take 7.5 mg by mouth at bedtime.  [name] omeprazole (PRILOSEC) 20 MG capsule Take 1 capsule (20 mg total) by mouth 2 (two) times daily before a meal.  . tacrolimus (PROTOPIC) 0.1 % ointment Apply 1 application topically 2 (two) times daily.  . tadalafil (CIALIS) 20 MG tablet 1 tab 1 hour prior to intercourse  .  testosterone cypionate (DEPOTESTOSTERONE CYPIONATE) 200 MG/ML injection Inject 0.4 mLs (80 mg total) into the muscle once a week.  . [DISCONTINUED] finasteride (PROSCAR) 5 MG tablet TAKE 1 TABLET BY MOUTH ONCE DAILY  . [DISCONTINUED] OVER THE COUNTER MEDICATION OREGA MAX  . [DISCONTINUED] OVER THE COUNTER MEDICATION RED MARINE ALGAE-375 MG.   Immunization History  Administered Date(s) Administered  . Influenza Split 08/05/2010, 09/30/2011  . Influenza,inj,Quad PF,6+ Mos 09/25/2015, 08/31/2018, 08/21/2019, 08/30/2019, 09/12/2020  . Tdap 05/29/2009   Social History   Tobacco Use  . Smoking status: Current Every Day Smoker    Years: 20.00     Types: Cigars  . Smokeless tobacco: Never Used  . Tobacco comment: about 15 cigars/evening  Substance Use Topics  . Alcohol use: Not Currently    Alcohol/week: 0.0 standard drinks    Comment: 12 pack a beer a night       Objective:   Physical Exam BP 126/86 (BP Location: Left Arm, Patient Position: Sitting, Cuff Size: Normal)   Pulse 99   Temp (!) 97.5 F (36.4 C) (Temporal)   Ht 6\' 2"  (1.88 m)   Wt 235 lb 6.4 oz (106.8 kg)   SpO2 94%   BMI 30.22 kg/m  GENERAL: Well-developed, overweight male fully ambulatory, no acute distress.  Plethoric appearing HEAD: Normocephalic, atraumatic.  EYES: Pupils equal, round, reactive to light.  No scleral icterus.  MOUTH: Nose/mouth/throat not examined due to masking requirements for COVID 19. NECK: Supple. No thyromegaly. Trachea midline. No JVD.  No adenopathy. PULMONARY: Good air entry bilaterally.  No adventitious sounds. CARDIOVASCULAR: S1 and S2. Regular rate and rhythm.  No rubs, murmurs or gallops heard. ABDOMEN: Benign. MUSCULOSKELETAL: No joint deformity, no clubbing, no edema.  NEUROLOGIC: No overt focal deficit, no gait disturbance, speech is fluent. SKIN: Intact,warm,dry.  Plethoric. PSYCH: Somewhat impulsive, cooperative.  Pulmonary function testing performed 11 July 2020, discussed with patient: FEV1 2.90 L or 68% predicted, FVC 4.75 L or 85% predicted, FEV1/FVC 61%.  ERV is to 59% consistent with obesity mild restrictive physiology likely due to obesity.  Mild diffusion capacity impairment which corrects for alveolar volume.     Assessment & Plan:     ICD-10-CM   1. Stage 2 moderate COPD by GOLD classification ([place])  J44.9    Continue Breztri 2 puffs twice a day Continue as needed albuterol STOP SMOKING!  2. Cough  R05.9    Improved with management of GERD and COPD  3. Gastroesophageal reflux disease, unspecified whether esophagitis present  K21.9    Continue lansoprazole Continue antireflux measures  4. Tobacco  dependence with current use- [place]  F17.200    Patient counseled regards to discontinuation of smoking Total counseling time 3 to 5 minutes  5. Need for immunization against influenza  Z23 Flu Vaccine QUAD 36+ mos IM   Received flu vaccine today.   Orders Placed This Encounter  Procedures  . Flu Vaccine QUAD 36+ mos IM    Smoking cessation instruction/counseling given:  counseled patient on the dangers of tobacco use, advised patient to stop smoking, and reviewed strategies to maximize success  Discussion:  Patient is doing well no further issues with posttussive syncope.  Cough very well controlled with management of COPD and GERD.  He has been advised to stop smoking.  He received flu vaccine today as part of health maintenance.  Also reiterated today that he should discontinue all over-the-counter supplements unless prescribed by a physician.  In follow-up in 4 months time he is  to call sooner should any new difficulties arise.  [name], MD [facility]   *This note was dictated using voice recognition software/Dragon.  Despite best efforts to proofread, errors can occur which can change the meaning.  Any change was purely unintentional.

## 2020-09-16 DIAGNOSIS — F3162 Bipolar disorder, current episode mixed, moderate: Secondary | ICD-10-CM | POA: Diagnosis not present

## 2020-09-16 DIAGNOSIS — F410 Panic disorder [episodic paroxysmal anxiety] without agoraphobia: Secondary | ICD-10-CM | POA: Diagnosis not present

## 2020-09-16 DIAGNOSIS — F1010 Alcohol abuse, uncomplicated: Secondary | ICD-10-CM | POA: Diagnosis not present

## 2020-09-19 ENCOUNTER — Encounter: Payer: Self-pay | Admitting: Pulmonary Disease

## 2020-10-15 ENCOUNTER — Other Ambulatory Visit: Payer: Self-pay | Admitting: Family Medicine

## 2020-10-15 ENCOUNTER — Other Ambulatory Visit: Payer: Self-pay | Admitting: Pulmonary Disease

## 2020-10-15 DIAGNOSIS — I10 Essential (primary) hypertension: Secondary | ICD-10-CM

## 2020-10-18 ENCOUNTER — Telehealth: Payer: Self-pay

## 2020-10-18 NOTE — Telephone Encounter (Addendum)
PA request was received from (pharmacy): [name] HQSUI:[phone] Fax: [phone] Medication name and strength: Lansoprazole 30mg  Ordering Provider: Dr. Patsey Berthold  Was PA started with [facility]?: yes If yes, please enter KEY: [id number] Medication tried and failed: Pepcid and Prilosec. Covered Alternatives: N/A  PA sent to plan, time frame for approval / denial: 48-72hr Routing to triage for follow-up

## 2020-10-21 DIAGNOSIS — F1010 Alcohol abuse, uncomplicated: Secondary | ICD-10-CM | POA: Diagnosis not present

## 2020-10-21 DIAGNOSIS — F3162 Bipolar disorder, current episode mixed, moderate: Secondary | ICD-10-CM | POA: Diagnosis not present

## 2020-10-21 DIAGNOSIS — F410 Panic disorder [episodic paroxysmal anxiety] without agoraphobia: Secondary | ICD-10-CM | POA: Diagnosis not present

## 2020-10-21 NOTE — Telephone Encounter (Signed)
PA has been approved through 11/29/2021. Sam with [facility] is aware of approval. Nothing further needed.

## 2020-10-31 ENCOUNTER — Other Ambulatory Visit: Payer: Self-pay | Admitting: *Deleted

## 2020-10-31 DIAGNOSIS — C8202 Follicular lymphoma grade I, intrathoracic lymph nodes: Secondary | ICD-10-CM

## 2020-11-01 ENCOUNTER — Inpatient Hospital Stay: Payer: Medicare HMO | Admitting: Oncology

## 2020-11-01 ENCOUNTER — Inpatient Hospital Stay: Payer: Medicare HMO

## 2020-11-07 ENCOUNTER — Other Ambulatory Visit: Payer: Self-pay | Admitting: Physician Assistant

## 2020-11-11 DIAGNOSIS — F1010 Alcohol abuse, uncomplicated: Secondary | ICD-10-CM | POA: Diagnosis not present

## 2020-11-11 DIAGNOSIS — F410 Panic disorder [episodic paroxysmal anxiety] without agoraphobia: Secondary | ICD-10-CM | POA: Diagnosis not present

## 2020-11-11 DIAGNOSIS — F3162 Bipolar disorder, current episode mixed, moderate: Secondary | ICD-10-CM | POA: Diagnosis not present

## 2020-11-12 ENCOUNTER — Inpatient Hospital Stay: Payer: Medicare HMO

## 2020-11-12 ENCOUNTER — Inpatient Hospital Stay: Payer: Medicare HMO | Admitting: Oncology

## 2020-11-25 ENCOUNTER — Other Ambulatory Visit: Payer: Self-pay | Admitting: Physician Assistant

## 2020-12-02 ENCOUNTER — Inpatient Hospital Stay: Payer: Medicare HMO | Attending: Oncology

## 2020-12-02 ENCOUNTER — Telehealth: Payer: Self-pay | Admitting: Oncology

## 2020-12-02 ENCOUNTER — Inpatient Hospital Stay: Payer: Medicare HMO | Admitting: Oncology

## 2020-12-02 DIAGNOSIS — E230 Hypopituitarism: Secondary | ICD-10-CM | POA: Insufficient documentation

## 2020-12-02 DIAGNOSIS — I10 Essential (primary) hypertension: Secondary | ICD-10-CM | POA: Insufficient documentation

## 2020-12-02 DIAGNOSIS — M1990 Unspecified osteoarthritis, unspecified site: Secondary | ICD-10-CM | POA: Insufficient documentation

## 2020-12-02 DIAGNOSIS — C8208 Follicular lymphoma grade I, lymph nodes of multiple sites: Secondary | ICD-10-CM | POA: Insufficient documentation

## 2020-12-02 DIAGNOSIS — Z79899 Other long term (current) drug therapy: Secondary | ICD-10-CM | POA: Insufficient documentation

## 2020-12-02 DIAGNOSIS — F32A Depression, unspecified: Secondary | ICD-10-CM | POA: Insufficient documentation

## 2020-12-02 DIAGNOSIS — Z7982 Long term (current) use of aspirin: Secondary | ICD-10-CM | POA: Insufficient documentation

## 2020-12-02 DIAGNOSIS — J45909 Unspecified asthma, uncomplicated: Secondary | ICD-10-CM | POA: Insufficient documentation

## 2020-12-02 DIAGNOSIS — E785 Hyperlipidemia, unspecified: Secondary | ICD-10-CM | POA: Insufficient documentation

## 2020-12-02 DIAGNOSIS — F17210 Nicotine dependence, cigarettes, uncomplicated: Secondary | ICD-10-CM | POA: Insufficient documentation

## 2020-12-02 DIAGNOSIS — G4730 Sleep apnea, unspecified: Secondary | ICD-10-CM | POA: Insufficient documentation

## 2020-12-02 NOTE — Telephone Encounter (Signed)
Called pt to reschedule missed appointment, unable to reach pt. Left VM to return call to reschedule.

## 2020-12-04 NOTE — Progress Notes (Unsigned)
MyChart Video Visit    Virtual Visit via Video Note   This visit type was conducted due to national recommendations for restrictions regarding the COVID-19 Pandemic (e.g. social distancing) in an effort to limit this patient's exposure and mitigate transmission in our community. This patient is at least at moderate risk for complications without adequate follow up. This format is felt to be most appropriate for this patient at this time. Physical exam was limited by quality of the video and audio technology used for the visit.   Patient location: *** Provider location: ***  I discussed the limitations of evaluation and management by telemedicine and the availability of in person appointments. The patient expressed understanding and agreed to proceed.  Patient: Russell Simpson   DOB: 29-Jun-1961   59 y.o. Male  MRN: [id number] Visit Date: 12/05/2020  Today's healthcare provider: Trey Sailors, PA-C   No chief complaint on file.  Subjective    HPI    {Show patient history (optional):23778::" "}  Medications: Outpatient Medications Prior to Visit  Medication Sig  . amLODipine (NORVASC) 5 MG tablet Take 1 tablet (5 mg total) by mouth daily.  . Arginine 2000 MG PACK Take by mouth daily.  [name] aspirin EC 81 MG tablet Take 81 mg by mouth daily. Swallow whole.  [name] atorvastatin (LIPITOR) 40 MG tablet Take 1 tablet (40 mg total) by mouth daily.  . Budeson-Glycopyrrol-Formoterol (BREZTRI AEROSPHERE) 160-9-4.8 MCG/ACT AERO Inhale 2 puffs into the lungs 2 (two) times daily.  . clonazePAM (KLONOPIN) 0.5 MG tablet   . DULoxetine (CYMBALTA) 60 MG capsule TAKE 1 CAPSULE BY MOUTH ONCE DAILY  . EUCRISA 2 % OINT Apply  a small amount to affected area   qd/bid to aa's chest, inframammary, eye  . famotidine (PEPCID) 20 MG tablet TAKE 1 TABLET BY MOUTH TWICE DAILY  . finasteride (PROSCAR) 5 MG tablet TAKE 1 TABLET BY MOUTH ONCE DAILY  . fluticasone (FLONASE) 50 MCG/ACT nasal spray Place 1 spray into  the nose daily.   . lansoprazole (PREVACID) 30 MG capsule TAKE 1 CAPSULE BY MOUTH ONCE DAILY AT NOON  . loratadine (CLARITIN) 10 MG tablet Take 10 mg by mouth daily.   . metoprolol succinate (TOPROL-XL) 25 MG 24 hr tablet TAKE 1 TABLET BY MOUTH ONCE DAILY  . montelukast (SINGULAIR) 10 MG tablet TAKE 1 TABLET BY MOUTH AT BEDTIME  . naproxen sodium (ANAPROX) 220 MG tablet Take 220 mg by mouth 2 (two) times daily with a meal.  . OLANZapine (ZYPREXA) 7.5 MG tablet Take 7.5 mg by mouth at bedtime.  [name] omeprazole (PRILOSEC) 20 MG capsule TAKE 1 CAPSULE BY MOUTH TWICE DAILY BEFORE MEALS  . tacrolimus (PROTOPIC) 0.1 % ointment Apply 1 application topically 2 (two) times daily.  . tadalafil (CIALIS) 20 MG tablet 1 tab 1 hour prior to intercourse  . testosterone cypionate (DEPOTESTOSTERONE CYPIONATE) 200 MG/ML injection Inject 0.4 mLs (80 mg total) into the muscle once a week.   No facility-administered medications prior to visit.    Review of Systems  {Heme  Chem  Endocrine  Serology  Results Review (optional):23779::" "}  Objective    There were no vitals taken for this visit. {Show previous vital signs (optional):23777::" "}  Physical Exam     Assessment & Plan     ***  No follow-ups on file.     I discussed the assessment and treatment plan with the patient. The patient was provided an opportunity to ask questions and all were  answered. The patient agreed with the plan and demonstrated an understanding of the instructions.   The patient was advised to call back or seek an in-person evaluation if the symptoms worsen or if the condition fails to improve as anticipated.  I provided *** minutes of non-face-to-face time during this encounter.  {provider attestation***:1}  Paulene Floor [facility] [phone] (phone) [phone] (fax)  [place]

## 2020-12-05 ENCOUNTER — Telehealth (INDEPENDENT_AMBULATORY_CARE_PROVIDER_SITE_OTHER): Payer: Medicare HMO | Admitting: Physician Assistant

## 2020-12-05 ENCOUNTER — Telehealth: Payer: Self-pay

## 2020-12-05 ENCOUNTER — Emergency Department
Admission: EM | Admit: 2020-12-05 | Discharge: 2020-12-05 | Discharge status: Against Medical Advice | Payer: Medicare HMO

## 2020-12-05 DIAGNOSIS — I10 Essential (primary) hypertension: Secondary | ICD-10-CM | POA: Diagnosis not present

## 2020-12-05 DIAGNOSIS — J441 Chronic obstructive pulmonary disease with (acute) exacerbation: Secondary | ICD-10-CM | POA: Diagnosis not present

## 2020-12-05 DIAGNOSIS — Z5329 Procedure and treatment not carried out because of patient's decision for other reasons: Secondary | ICD-10-CM

## 2020-12-05 DIAGNOSIS — Z20822 Contact with and (suspected) exposure to covid-19: Secondary | ICD-10-CM | POA: Diagnosis not present

## 2020-12-05 DIAGNOSIS — F17210 Nicotine dependence, cigarettes, uncomplicated: Secondary | ICD-10-CM | POA: Diagnosis not present

## 2020-12-05 DIAGNOSIS — Z91199 Patient's noncompliance with other medical treatment and regimen due to unspecified reason: Secondary | ICD-10-CM

## 2020-12-05 DIAGNOSIS — J440 Chronic obstructive pulmonary disease with acute lower respiratory infection: Secondary | ICD-10-CM | POA: Diagnosis not present

## 2020-12-05 DIAGNOSIS — J209 Acute bronchitis, unspecified: Secondary | ICD-10-CM | POA: Diagnosis not present

## 2020-12-05 DIAGNOSIS — R0789 Other chest pain: Secondary | ICD-10-CM | POA: Diagnosis not present

## 2020-12-05 NOTE — Telephone Encounter (Signed)
Copied from CRM [id number]. Topic: General - Other >> Dec 05, 2020  1:07 PM Gwenlyn Fudge wrote: Reason for CRM: Pt calling and is requesting to have his appt cancelled for today. Please advise.

## 2020-12-06 NOTE — Telephone Encounter (Signed)
Patient appointment was put as no show due to late cancellation.

## 2020-12-09 ENCOUNTER — Other Ambulatory Visit: Payer: Self-pay | Admitting: *Deleted

## 2020-12-09 DIAGNOSIS — C8202 Follicular lymphoma grade I, intrathoracic lymph nodes: Secondary | ICD-10-CM

## 2020-12-09 DIAGNOSIS — D751 Secondary polycythemia: Secondary | ICD-10-CM

## 2020-12-10 ENCOUNTER — Inpatient Hospital Stay: Payer: Medicare HMO

## 2020-12-10 ENCOUNTER — Inpatient Hospital Stay: Payer: Medicare HMO | Admitting: Oncology

## 2020-12-10 ENCOUNTER — Encounter: Payer: Self-pay | Admitting: Oncology

## 2020-12-10 VITALS — BP 134/89 | HR 119 | Temp 98.6°F | Resp 18 | Wt 231.3 lb

## 2020-12-10 DIAGNOSIS — I10 Essential (primary) hypertension: Secondary | ICD-10-CM | POA: Diagnosis not present

## 2020-12-10 DIAGNOSIS — E230 Hypopituitarism: Secondary | ICD-10-CM | POA: Diagnosis not present

## 2020-12-10 DIAGNOSIS — F32A Depression, unspecified: Secondary | ICD-10-CM | POA: Diagnosis not present

## 2020-12-10 DIAGNOSIS — G4730 Sleep apnea, unspecified: Secondary | ICD-10-CM | POA: Diagnosis not present

## 2020-12-10 DIAGNOSIS — C8208 Follicular lymphoma grade I, lymph nodes of multiple sites: Secondary | ICD-10-CM | POA: Diagnosis not present

## 2020-12-10 DIAGNOSIS — C8202 Follicular lymphoma grade I, intrathoracic lymph nodes: Secondary | ICD-10-CM

## 2020-12-10 DIAGNOSIS — J45909 Unspecified asthma, uncomplicated: Secondary | ICD-10-CM | POA: Diagnosis not present

## 2020-12-10 DIAGNOSIS — M1990 Unspecified osteoarthritis, unspecified site: Secondary | ICD-10-CM | POA: Diagnosis not present

## 2020-12-10 DIAGNOSIS — E785 Hyperlipidemia, unspecified: Secondary | ICD-10-CM | POA: Diagnosis not present

## 2020-12-10 DIAGNOSIS — Z7982 Long term (current) use of aspirin: Secondary | ICD-10-CM | POA: Diagnosis not present

## 2020-12-10 DIAGNOSIS — Z79899 Other long term (current) drug therapy: Secondary | ICD-10-CM | POA: Diagnosis not present

## 2020-12-10 DIAGNOSIS — F17210 Nicotine dependence, cigarettes, uncomplicated: Secondary | ICD-10-CM | POA: Diagnosis not present

## 2020-12-10 DIAGNOSIS — D751 Secondary polycythemia: Secondary | ICD-10-CM

## 2020-12-10 LAB — CBC WITH DIFFERENTIAL/PLATELET
Abs Immature Granulocytes: 0.06 10*3/uL (ref 0.00–0.07)
Basophils Absolute: 0 10*3/uL (ref 0.0–0.1)
Basophils Relative: 0 %
Eosinophils Absolute: 0 10*3/uL (ref 0.0–0.5)
Eosinophils Relative: 1 %
HCT: 44.9 % (ref 39.0–52.0)
Hemoglobin: 15.7 g/dL (ref 13.0–17.0)
Immature Granulocytes: 1 %
Lymphocytes Relative: 45 %
Lymphs Abs: 4 10*3/uL (ref 0.7–4.0)
MCH: 31.4 pg (ref 26.0–34.0)
MCHC: 35 g/dL (ref 30.0–36.0)
MCV: 89.8 fL (ref 80.0–100.0)
Monocytes Absolute: 0.7 10*3/uL (ref 0.1–1.0)
Monocytes Relative: 8 %
Neutro Abs: 4 10*3/uL (ref 1.7–7.7)
Neutrophils Relative %: 45 %
Platelets: 125 10*3/uL — ABNORMAL LOW (ref 150–400)
RBC: 5 MIL/uL (ref 4.22–5.81)
RDW: 13.1 % (ref 11.5–15.5)
WBC: 8.8 10*3/uL (ref 4.0–10.5)
nRBC: 0 % (ref 0.0–0.2)

## 2020-12-10 LAB — COMPREHENSIVE METABOLIC PANEL
ALT: 26 U/L (ref 0–44)
AST: 27 U/L (ref 15–41)
Albumin: 3.9 g/dL (ref 3.5–5.0)
Alkaline Phosphatase: 119 U/L (ref 38–126)
Anion gap: 10 (ref 5–15)
BUN: 16 mg/dL (ref 6–20)
CO2: 27 mmol/L (ref 22–32)
Calcium: 8.6 mg/dL — ABNORMAL LOW (ref 8.9–10.3)
Chloride: 101 mmol/L (ref 98–111)
Creatinine, Ser: 0.96 mg/dL (ref 0.61–1.24)
GFR, Estimated: >60 mL/min (ref >60)
Glucose, Bld: 113 mg/dL — ABNORMAL HIGH (ref 70–99)
Potassium: 3.8 mmol/L (ref 3.5–5.1)
Sodium: 138 mmol/L (ref 135–145)
Total Bilirubin: 0.5 mg/dL (ref 0.3–1.2)
Total Protein: 6.9 g/dL (ref 6.5–8.1)

## 2020-12-10 LAB — LACTATE DEHYDROGENASE: LDH: 104 U/L (ref 98–192)

## 2020-12-11 NOTE — Progress Notes (Signed)
Not on video platform at appointed time and did not answer phone call x2. NO SHOW.

## 2020-12-13 NOTE — Progress Notes (Signed)
Hematology/Oncology Consult note [facility]  Telephone:([phone] [phone]  Patient Care Team: Paulene Floor as PCP - General (Physician Assistant) Kate Sable, MD as PCP - Cardiology (Cardiology)   Name of the patient: Russell Simpson  [id number]  10-24-1961   Date of visit: 12/13/20  Diagnosis-low-grade follicular lymphoma under observation  Chief complaint/ Reason for visit-routine follow-up of follicular lymphoma  Heme/Onc history: patient is a 60 year old male with a past medical history significant for hypertension, hyperlipidemia, hypogonadism, pituitary adenoma who recently had a fall on in December 2020.[name] He sustained a left anterior frontal sinus as well as supraorbital rim fracture on 11/21/2019 which was managed conservatively. He then presented to the ER at [facility] with symptoms of right-sided chest wall pain this led to a CT PE which did not show any evidence of pulmonary embolism. He was noted to have bilateral symmetric axillary adenopathy measuring up to 1.8 cm. Scattered mediastinal adenopathy. Right paratracheal node measuring up to 1.2 cm. Prominent right costophrenic lymph node measuring up to 1 cm. Findings are nonspecific but could be seen with lymphoma versus systemic inflammatory disease such as sarcoidosis. Of note patient has had a prior CT scan for lung screening back in 2015 when he was not noted to have this adenopathy.  Repeat CT chest in August 2021 showed persistent mild nonbulky axillary and mediastinal adenopathy which had not changed significantly as compared to his prior CT in December 2020.  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   Interval history-patient is recovering from a bout of upper respiratory infection for which she was given antibiotics and steroids.  He reports feeling better.  Denies any shortness of breath or  persistent cough.  Reports some ongoing fatigue  ECOG PS- 1 Pain scale- 0   Review of systems- Review of Systems  Constitutional: Negative for chills, fever, malaise/fatigue and weight loss.  HENT: Positive for congestion. Negative for ear discharge and nosebleeds.   Eyes: Negative for blurred vision.  Respiratory: Negative for cough, hemoptysis, sputum production, shortness of breath and wheezing.   Cardiovascular: Negative for chest pain, palpitations, orthopnea and claudication.  Gastrointestinal: Negative for abdominal pain, blood in stool, constipation, diarrhea, heartburn, melena, nausea and vomiting.  Genitourinary: Negative for dysuria, flank pain, frequency, hematuria and urgency.  Musculoskeletal: Negative for back pain, joint pain and myalgias.  Skin: Negative for rash.  Neurological: Negative for dizziness, tingling, focal weakness, seizures, weakness and headaches.  Endo/Heme/Allergies: Does not bruise/bleed easily.  Psychiatric/Behavioral: Negative for depression and suicidal ideas. The patient does not have insomnia.       Allergies  Allergen Reactions  . Penicillins Anaphylaxis  . Tetanus Toxoids Swelling  . Lisinopril     Other reaction(s): Other (See Comments) Other reaction(s): Cough Other reaction(s): Cough Other reaction(s): Cough Other reaction(s): Cough  . Losartan     Other reaction(s): Other (See Comments) Other reaction(s): Muscle Pain Other reaction(s): Muscle Pain Other reaction(s): Muscle Pain  . Tetanus Toxoid   . Codeine Nausea Only  . Doxycycline Rash     Past Medical History:  Diagnosis Date  . 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 Arthritis   . Asthma   . Brain tumor (benign) ([place])    benign pituitary neoplasm  . Chronic pain    right arm  . Depression   . Environmental and seasonal allergies   . Hypertension   . Pneumonia   .  Sleep apnea    does not wear CPAP ; uses humidifier instead     Past Surgical History:   Procedure Laterality Date  . ANTERIOR CERVICAL DECOMP/DISCECTOMY FUSION N/A 06/17/2016   Procedure: ANTERIOR CERVICAL DECOMPRESSION FUSION, CERVICAL 3-4, CERVICAL 4-5 WITH INSTRUMENTATION AND ALLOGRAFT;  Surgeon: Phylliss Bob, MD;  Location: [place];  Service: Orthopedics;  Laterality: N/A;  ANTERIOR CERVICAL DECOMPRESSION FUSION, CERVICAL 3-4, CERVICAL 4-5 WITH INSTRUMENTATION AND ALLOGRAFT  . BACK SURGERY    . NECK SURGERY  2009    Social History   Socioeconomic History  . Marital status: Divorced    Spouse name: Not on file  . Number of children: Not on file  . Years of education: Not on file  . Highest education level: Not on file  Occupational History  . Not on file  Tobacco Use  . Smoking status: Current Every Day Smoker    Years: 20.00    Types: Cigars  . Smokeless tobacco: Never Used  . Tobacco comment: about 15 cigars/evening  Vaping Use  . Vaping Use: Former  . Start date: 04/30/2017  . Quit date: 11/30/2017  Substance and Sexual Activity  . Alcohol use: Not Currently    Alcohol/week: 0.0 standard drinks    Comment: 12 pack a beer a night  . Drug use: No  . Sexual activity: Not Currently  Other Topics Concern  . Not on file  Social History Narrative  . Not on file   Social Determinants of Health   Financial Resource Strain: Not on file  Food Insecurity: Not on file  Transportation Needs: Not on file  Physical Activity: Not on file  Stress: Not on file  Social Connections: Not on file  Intimate Partner Violence: Not on file    Family History  Problem Relation Age of Onset  . Diabetes Mother   . Diabetes Other      Current Outpatient Medications:  .  amLODipine (NORVASC) 5 MG tablet, Take 1 tablet (5 mg total) by mouth daily., Disp: 90 tablet, Rfl: 3 .  aspirin EC 81 MG tablet, Take 81 mg by mouth daily. Swallow whole., Disp: , Rfl:  .  Budeson-Glycopyrrol-Formoterol (BREZTRI AEROSPHERE) 160-9-4.8 MCG/ACT AERO, Inhale 2 puffs into the lungs 2 (two) times  daily., Disp: 5.9 g, Rfl: 0 .  clonazePAM (KLONOPIN) 0.5 MG tablet, , Disp: , Rfl:  .  DULoxetine (CYMBALTA) 60 MG capsule, TAKE 1 CAPSULE BY MOUTH ONCE DAILY, Disp: 90 capsule, Rfl: 0 .  EUCRISA 2 % OINT, Apply  a small amount to affected area   qd/bid to aa's chest, inframammary, eye, Disp: , Rfl:  .  famotidine (PEPCID) 20 MG tablet, TAKE 1 TABLET BY MOUTH TWICE DAILY, Disp: 180 tablet, Rfl: 0 .  finasteride (PROSCAR) 5 MG tablet, TAKE 1 TABLET BY MOUTH ONCE DAILY, Disp: 90 tablet, Rfl: 2 .  fluticasone (FLONASE) 50 MCG/ACT nasal spray, Place 1 spray into the nose daily. , Disp: , Rfl:  .  lansoprazole (PREVACID) 30 MG capsule, TAKE 1 CAPSULE BY MOUTH ONCE DAILY AT NOON, Disp: 30 capsule, Rfl: 3 .  loratadine (CLARITIN) 10 MG tablet, Take 10 mg by mouth daily. , Disp: , Rfl:  .  metoprolol succinate (TOPROL-XL) 25 MG 24 hr tablet, TAKE 1 TABLET BY MOUTH ONCE DAILY, Disp: 30 tablet, Rfl: 0 .  montelukast (SINGULAIR) 10 MG tablet, TAKE 1 TABLET BY MOUTH AT BEDTIME, Disp: 90 tablet, Rfl: 1 .  naproxen sodium (ANAPROX) 220 MG tablet, Take 220 mg  by mouth 2 (two) times daily with a meal., Disp: , Rfl:  .  omeprazole (PRILOSEC) 20 MG capsule, TAKE 1 CAPSULE BY MOUTH TWICE DAILY BEFORE MEALS, Disp: 60 capsule, Rfl: 0 .  tacrolimus (PROTOPIC) 0.1 % ointment, Apply 1 application topically 2 (two) times daily., Disp: , Rfl:  .  tadalafil (CIALIS) 20 MG tablet, 1 tab 1 hour prior to intercourse, Disp: 10 tablet, Rfl: 0 .  testosterone cypionate (DEPOTESTOSTERONE CYPIONATE) 200 MG/ML injection, Inject 0.4 mLs (80 mg total) into the muscle once a week., Disp: 1 mL, Rfl: 0 .  atorvastatin (LIPITOR) 40 MG tablet, Take 1 tablet (40 mg total) by mouth daily., Disp: 30 tablet, Rfl: 5 .  OLANZapine (ZYPREXA) 7.5 MG tablet, Take 7.5 mg by mouth at bedtime., Disp: , Rfl:   Physical exam:  Vitals:   12/10/20 1427  BP: 134/89  Pulse: (!) 119  Resp: 18  Temp: 98.6 F (37 C)  TempSrc: Tympanic  SpO2: 95%   Weight: 231 lb 4.8 oz (104.9 kg)   Physical Exam Constitutional:      General: He is not in acute distress. Eyes:     Extraocular Movements: EOM normal.  Cardiovascular:     Rate and Rhythm: Normal rate and regular rhythm.     Heart sounds: Normal heart sounds.  Pulmonary:     Effort: Pulmonary effort is normal.     Breath sounds: Normal breath sounds.  Abdominal:     General: Bowel sounds are normal.     Palpations: Abdomen is soft.  Lymphadenopathy:     Comments: No palpable cervical, supraclavicular, axillary or inguinal adenopathy   Skin:    General: Skin is warm and dry.  Neurological:     Mental Status: He is alert and oriented to person, place, and time.      CMP Latest Ref Rng & Units 12/10/2020  Glucose 70 - 99 mg/dL 113(H)  BUN 6 - 20 mg/dL 16  Creatinine 0.61 - 1.24 mg/dL 0.96  Sodium 135 - 145 mmol/L 138  Potassium 3.5 - 5.1 mmol/L 3.8  Chloride 98 - 111 mmol/L 101  CO2 22 - 32 mmol/L 27  Calcium 8.9 - 10.3 mg/dL 8.6(L)  Total Protein 6.5 - 8.1 g/dL 6.9  Total Bilirubin 0.3 - 1.2 mg/dL 0.5  Alkaline Phos 38 - 126 U/L 119  AST 15 - 41 U/L 27  ALT 0 - 44 U/L 26   CBC Latest Ref Rng & Units 12/10/2020  WBC 4.0 - 10.5 K/uL 8.8  Hemoglobin 13.0 - 17.0 g/dL 15.7  Hematocrit 39.0 - 52.0 % 44.9  Platelets 150 - 400 K/uL 125(L)    Assessment and plan- Patient is a 60 y.o. male with low-grade follicular lymphoma here for routine follow-up  Patient had his last CT abdomen and pelvis with contrast in September and prior to that had a CT chest and of August 2021.  CT abdomen and pelvis with contrast showed mild to moderately enlarged generalized intra-abdominal adenopathy ranging from 1.8 to 2.2 cm.  None of them were causing any organ compromise.  CT chest also showed stable enlarged mediastinal hilar and axillary lymph nodes which have not changed since 8 months prior.  Patient does not have any unintentional weight loss drenching night sweats or cytopenias.  Kidney  and liver functions remain stable.  No indication to treat his follicular lymphoma at this time.  I will follow-up with another CT scan in March 2022 and see him after the scans with  a CBC with differential CMP and LDH   Visit Diagnosis 1. 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 Randa Evens, MD, MPH [facility] [phone] 12/13/2020 8:34 AM

## 2020-12-24 ENCOUNTER — Other Ambulatory Visit: Payer: Self-pay | Admitting: Physician Assistant

## 2020-12-24 DIAGNOSIS — F4310 Post-traumatic stress disorder, unspecified: Secondary | ICD-10-CM

## 2020-12-24 DIAGNOSIS — F3160 Bipolar disorder, current episode mixed, unspecified: Secondary | ICD-10-CM

## 2020-12-24 DIAGNOSIS — K219 Gastro-esophageal reflux disease without esophagitis: Secondary | ICD-10-CM

## 2020-12-24 DIAGNOSIS — F410 Panic disorder [episodic paroxysmal anxiety] without agoraphobia: Secondary | ICD-10-CM

## 2020-12-24 DIAGNOSIS — R059 Cough, unspecified: Secondary | ICD-10-CM

## 2021-01-10 ENCOUNTER — Other Ambulatory Visit: Payer: Self-pay | Admitting: Physician Assistant

## 2021-01-25 ENCOUNTER — Other Ambulatory Visit: Payer: Self-pay | Admitting: Family Medicine

## 2021-01-25 DIAGNOSIS — I10 Essential (primary) hypertension: Secondary | ICD-10-CM

## 2021-01-25 NOTE — Telephone Encounter (Signed)
Requested Prescriptions  Pending Prescriptions Disp Refills  . metoprolol succinate (TOPROL-XL) 25 MG 24 hr tablet [Pharmacy Med Name: METOPROLOL SUCCINATE ER 25 MG TAB] 90 tablet 0    Sig: TAKE 1 TABLET BY MOUTH ONCE DAILY     Cardiovascular:  Beta Blockers Failed - 01/25/2021  2:37 PM      Failed - Last Heart Rate in normal range    Pulse Readings from Last 1 Encounters:  12/10/20 (!) 119         Passed - Last BP in normal range    BP Readings from Last 1 Encounters:  12/10/20 134/89         Passed - Valid encounter within last 6 months    Recent Outpatient Visits          1 month ago No-show for appointment   [place], PA-C   8 months ago Mixed bipolar I disorder [facility])   [place], [place], [place]   10 months ago Cough   [place], Vickki Muff, PA-C   11 months ago [place], [place], [place]   1 year ago Lymphadenopathy   [place], Wendee Beavers, [place]      Future Appointments            In 2 days Terrilee Croak, Wendee Beavers, PA-C [employer], [place]   In 6 months She[place]on, Ronda Fairly, [place]

## 2021-01-27 ENCOUNTER — Other Ambulatory Visit: Payer: Self-pay

## 2021-01-27 ENCOUNTER — Encounter: Payer: Self-pay | Admitting: Physician Assistant

## 2021-01-27 ENCOUNTER — Ambulatory Visit (INDEPENDENT_AMBULATORY_CARE_PROVIDER_SITE_OTHER): Payer: Medicare HMO | Admitting: Physician Assistant

## 2021-01-27 VITALS — BP 141/100 | HR 113 | Temp 98.5°F | Wt 238.6 lb

## 2021-01-27 DIAGNOSIS — I10 Essential (primary) hypertension: Secondary | ICD-10-CM

## 2021-01-27 DIAGNOSIS — R55 Syncope and collapse: Secondary | ICD-10-CM

## 2021-01-27 DIAGNOSIS — R1032 Left lower quadrant pain: Secondary | ICD-10-CM | POA: Diagnosis not present

## 2021-01-27 MED ORDER — AMLODIPINE BESYLATE 5 MG PO TABS: 5.0000 mg | ORAL_TABLET | Freq: Every day | ORAL | 1 refills | Status: DC

## 2021-01-27 NOTE — Progress Notes (Signed)
Established patient visit   Patient: Russell Simpson   DOB: 1961/02/18   60 y.o. Male  MRN: [id number] Visit Date: 01/27/2021  Today's healthcare provider: Trinna Post, PA-C   Chief Complaint  Patient presents with  . Hypertension  I,Bassy Fetterly M Elmore Hyslop,acting as a scribe for [employer], PA-C.,have documented all relevant documentation on the behalf of Trinna Post, PA-C,as directed by  Trinna Post, PA-C while in the presence of Trinna Post, PA-C.  Subjective    Abdominal Pain This is a new problem. The current episode started 1 to 4 weeks ago. The onset quality is sudden. The problem occurs intermittently. The problem has been unchanged. The pain is located in the LLQ. The pain is at a severity of 4/10. The pain is mild. The quality of the pain is sharp. The abdominal pain does not radiate. Associated symptoms include constipation. Pertinent negatives include no belching, diarrhea, frequency, headaches, nausea or vomiting. The pain is aggravated by deep breathing. The pain is relieved by nothing. He has tried nothing for the symptoms. The treatment provided no relief.  He reports this pain has improved since it started.    In the interim, he has been diagnosed with low grade follicular lymphoma. He is currently being monitored for this by [facility]. Treatment has been deferred due to indolent nature of disease. Surveillance CT abdomen/pelvis scheduled 02/10/2021.   Hypertension, follow-up  BP Readings from Last 3 Encounters:  01/27/21 (!) 141/100  12/10/20 134/89  09/12/20 126/86   Wt Readings from Last 3 Encounters:  01/27/21 238 lb 9.6 oz (108.2 kg)  12/10/20 231 lb 4.8 oz (104.9 kg)  09/12/20 235 lb 6.4 oz (106.8 kg)     He was last seen for hypertension 1 years ago. Reports he had one day of low bps, 90s/60s. Has been eating one meal a day. Since then, this issue resolved. Since then, BP has been elevated. Has seen cardiology last summer 2021 for  syncope, no abnormal findings throughout workup. Instructed to f/u 6 months. BP at that visit was normal. Management since that visit includes continue medications.   He reports good compliance with treatment. He is not having side effects.  He is following a Regular diet. He is not exercising. He does smoke.  Use of agents associated with hypertension: none.   Outside blood pressures are 80's-100's/60's-80's-. Symptoms: No chest pain No chest pressure  No palpitations No syncope  No dyspnea No orthopnea  No paroxysmal nocturnal dyspnea No lower extremity edema   Pertinent labs: Lab Results  Component Value Date   CHOL 255 (H) 02/14/2019   HDL 53 02/14/2019   LDLCALC 171 (H) 02/14/2019   TRIG 155 (H) 02/14/2019   CHOLHDL 4.8 02/14/2019   Lab Results  Component Value Date   NA 138 12/10/2020   K 3.8 12/10/2020   CREATININE 0.96 12/10/2020   GFRNONAA >60 12/10/2020   GFRAA >60 07/25/2020   GLUCOSE 113 (H) 12/10/2020     The 10-year ASCVD risk score Mikey Bussing DC Jr., et al., 2013) is: 20.1%   Pulse Readings from Last 3 Encounters:  01/27/21 (!) 113  12/10/20 (!) 119  09/12/20 99    ---------------------------------------------------------------------------------------------------      Medications: Outpatient Medications Prior to Visit  Medication Sig  . amLODipine (NORVASC) 5 MG tablet Take 1 tablet (5 mg total) by mouth daily.  [name] aspirin EC 81 MG tablet Take 81 mg by mouth daily. Swallow whole.  [name]  Budeson-Glycopyrrol-Formoterol (BREZTRI AEROSPHERE) 160-9-4.8 MCG/ACT AERO Inhale 2 puffs into the lungs 2 (two) times daily.  . clonazePAM (KLONOPIN) 0.5 MG tablet   . DULoxetine (CYMBALTA) 60 MG capsule TAKE 1 CAPSULE BY MOUTH ONCE DAILY  . EUCRISA 2 % OINT Apply  a small amount to affected area   qd/bid to aa's chest, inframammary, eye  . famotidine (PEPCID) 20 MG tablet TAKE 1 TABLET BY MOUTH TWICE DAILY  . finasteride (PROSCAR) 5 MG tablet TAKE 1 TABLET BY MOUTH  ONCE DAILY  . fluticasone (FLONASE) 50 MCG/ACT nasal spray Place 1 spray into the nose daily.   . lansoprazole (PREVACID) 30 MG capsule TAKE 1 CAPSULE BY MOUTH ONCE DAILY AT NOON  . loratadine (CLARITIN) 10 MG tablet Take 10 mg by mouth daily.   . metoprolol succinate (TOPROL-XL) 25 MG 24 hr tablet TAKE 1 TABLET BY MOUTH ONCE DAILY  . montelukast (SINGULAIR) 10 MG tablet TAKE 1 TABLET BY MOUTH AT BEDTIME  . naproxen sodium (ANAPROX) 220 MG tablet Take 220 mg by mouth 2 (two) times daily with a meal.  . OLANZapine (ZYPREXA) 7.5 MG tablet Take 7.5 mg by mouth at bedtime.  [name] omeprazole (PRILOSEC) 20 MG capsule TAKE 1 CAPSULE BY MOUTH TWICE DAILY BEFORE MEALS  . tacrolimus (PROTOPIC) 0.1 % ointment Apply 1 application topically 2 (two) times daily.  . tadalafil (CIALIS) 20 MG tablet 1 tab 1 hour prior to intercourse  . testosterone cypionate (DEPOTESTOSTERONE CYPIONATE) 200 MG/ML injection Inject 0.4 mLs (80 mg total) into the muscle once a week.  [name] atorvastatin (LIPITOR) 40 MG tablet Take 1 tablet (40 mg total) by mouth daily.   No facility-administered medications prior to visit.    Review of Systems  Gastrointestinal: Positive for abdominal pain and constipation. Negative for diarrhea, nausea and vomiting.  Genitourinary: Negative for frequency.  Neurological: Negative for headaches.       Objective    BP (!) 141/100 (BP Location: Left Arm, Patient Position: Sitting, Cuff Size: Normal)   Pulse (!) 113   Temp 98.5 F (36.9 C) (Oral)   Wt 238 lb 9.6 oz (108.2 kg)   SpO2 96%   BMI 30.63 kg/m     Physical Exam Constitutional:      Appearance: Normal appearance. He is obese.  Cardiovascular:     Rate and Rhythm: Normal rate and regular rhythm.     Heart sounds: Normal heart sounds.  Pulmonary:     Effort: Pulmonary effort is normal.     Breath sounds: Normal breath sounds.  Abdominal:     General: Bowel sounds are normal. There is no distension.     Palpations: Abdomen is  soft. There is no mass.     Tenderness: There is no abdominal tenderness.  Skin:    General: Skin is warm and dry.  Neurological:     General: No focal deficit present.     Mental Status: He is alert and oriented to person, place, and time.  Psychiatric:        Mood and Affect: Mood normal.        Behavior: Behavior normal.       No results found for any visits on 01/27/21.  Assessment & Plan    1. Syncope and collapse  No cause found throughout cardiology workup. Thought to be post-tussive.   2. Left lower quadrant pain  Improving, CT scan currently scheduled for 02/10/2021 will also give [place] some insight on this.   3. Essential hypertension  Uncontrolled, add amlodipine. Encouraged follow up with cardiology.   - amLODipine (NORVASC) 5 MG tablet; Take 1 tablet (5 mg total) by mouth daily.  Dispense: 90 tablet; Refill: 1   No follow-ups on file.      ITrinna Post, PA-C, have reviewed all documentation for this visit. The documentation on 01/29/21 for the exam, diagnosis, procedures, and orders are all accurate and complete.  The entirety of the information documented in the History of Present Illness, Review of Systems and Physical Exam were personally obtained by me. Portions of this information were initially documented by [facility] and reviewed by me for thoroughness and accuracy.     Paulene Floor  [facility] [phone] (phone) [phone] (fax)  [place]

## 2021-01-28 ENCOUNTER — Other Ambulatory Visit: Payer: Self-pay | Admitting: Urology

## 2021-01-28 ENCOUNTER — Ambulatory Visit: Payer: Medicare HMO | Admitting: Dermatology

## 2021-01-28 DIAGNOSIS — L720 Epidermal cyst: Secondary | ICD-10-CM

## 2021-01-28 DIAGNOSIS — C44612 Basal cell carcinoma of skin of right upper limb, including shoulder: Secondary | ICD-10-CM | POA: Diagnosis not present

## 2021-01-28 DIAGNOSIS — D485 Neoplasm of uncertain behavior of skin: Secondary | ICD-10-CM

## 2021-01-28 DIAGNOSIS — L111 Transient acantholytic dermatosis [Grover]: Secondary | ICD-10-CM | POA: Diagnosis not present

## 2021-01-28 DIAGNOSIS — L821 Other seborrheic keratosis: Secondary | ICD-10-CM

## 2021-01-28 DIAGNOSIS — C4491 Basal cell carcinoma of skin, unspecified: Secondary | ICD-10-CM

## 2021-01-28 HISTORY — DX: Basal cell carcinoma of skin, unspecified: C44.91

## 2021-01-28 MED ORDER — TRIAMCINOLONE ACETONIDE 0.1 % EX CREA: TOPICAL_CREAM | CUTANEOUS | 1 refills | Status: DC

## 2021-01-28 MED ORDER — CEFDINIR 300 MG PO CAPS: 300.0000 mg | ORAL_CAPSULE | Freq: Two times a day (BID) | ORAL | 0 refills | Status: AC

## 2021-01-28 NOTE — Progress Notes (Signed)
   Follow-Up Visit   Subjective  Russell Simpson is a 60 y.o. 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 He also has a rash on his arms x 2 weeks. Not itchy, not improving with triamcinolone. Patient also has a spot on his right shoulder and right posterior upper arm that won't go away.).  He has a sore bump at L jaw present for few days.   The following portions of the chart were reviewed this encounter and updated as appropriate:       Review of Systems:  No other skin or systemic complaints except as noted in HPI or Assessment and Plan.  Objective  Well appearing patient in no apparent distress; mood and affect are within normal limits.  A focused examination was performed including face, chest, arms. Relevant physical exam findings are noted in the Assessment and Plan.  Objective  Chest, arms: Pink scaly macules and papules on right upper chest, central chest, sternal area, arms.  Objective  Right Upper Arm: 6.13mm pink pearly papule        Objective  Left Mandible: Firm pink sq nodule 5.77mm, tender to touch   Assessment & Plan  Grover's disease Chest, arms  Chronic condition, with flare Continue TMC 0.1% cream followed by tacrolimus ointment BID x 1 week, then decrease to QD x 1 week, if needed. After 2 weeks, use only tacrolimus ointment.  Restart TMC cream prn flares  triamcinolone (KENALOG) 0.1 % - Chest, arms  Neoplasm of uncertain behavior of skin Right Upper Arm  Skin / nail biopsy Type of biopsy: tangential   Informed consent: discussed and consent obtained   Patient was prepped and draped in usual sterile fashion: Area prepped with alcohol. Anesthesia: the lesion was anesthetized in a standard fashion   Anesthetic:  1% lidocaine w/ epinephrine 1-100,000 buffered w/ 8.4% NaHCO3 Instrument used: flexible razor blade   Hemostasis  achieved with: pressure, aluminum chloride and electrodesiccation   Outcome: patient tolerated procedure well   Post-procedure details: wound care instructions given   Post-procedure details comment:  Ointment and small bandage applied  Specimen 1 - Surgical pathology Differential Diagnosis: Dermatofibroma r/o BCC Check Margins: No 6.46mm pink pearly papule   Epidermal inclusion cyst Left Mandible  Inflamed cyst vrs acne cyst  Start Omnicef 300mg  take 1 po BID x 7 days dsp #14 0Rf. (pt is allergic to Doxy)  Start Neutrogena Rapid Clear - spot treat AA QD. Risk bleaching.  cefdinir (OMNICEF) 300 MG capsule - Left Mandible  Seborrheic Keratoses - Stuck-on, waxy, tan-brown papules   - Discussed benign etiology and prognosis. - Observe - Call for any changes   Return pending bx results.   IJamesetta Orleans, CMA, am acting as scribe for Brendolyn Patty, MD .  Documentation: I have reviewed the above documentation for accuracy and completeness, and I agree with the above.  Brendolyn Patty MD

## 2021-01-28 NOTE — Patient Instructions (Addendum)
Topical steroids (such as triamcinolone, fluocinolone, fluocinonide, mometasone, clobetasol, halobetasol, betamethasone, hydrocortisone) can cause thinning and lightening of the skin if they are used for too long in the same area. Your physician has selected the right strength medicine for your problem and area affected on the body. Please use your medication only as directed by your physician to prevent side effects.   Continue triamcinolone cream to affected areas rash two times a day x 1 week, followed by tacrolimus ointment. Then decrease to once daily for each, if needed. After 2 weeks, use only tacrolimus ointment as needed for rash.  Neutrogena Rapid Clear - Spot treat cyst on left jaw/neck 1-2 times a day until improved. Risk of bleaching.  Wound Care Instructions  1. Cleanse wound gently with soap and water once a day then pat dry with clean gauze. Apply a thing coat of Petrolatum (petroleum jelly, "Vaseline") over the wound (unless you have an allergy to this). We recommend that you use a new, sterile tube of Vaseline. Do not pick or remove scabs. Do not remove the yellow or white "healing tissue" from the base of the wound.  2. Cover the wound with fresh, clean, nonstick gauze and secure with paper tape. You may use Band-Aids in place of gauze and tape if the would is small enough, but would recommend trimming much of the tape off as there is often too much. Sometimes Band-Aids can irritate the skin.  3. You should call the office for your biopsy report after 1 week if you have not already been contacted.  4. If you experience any problems, such as abnormal amounts of bleeding, swelling, significant bruising, significant pain, or evidence of infection, please call the office immediately.  5. FOR ADULT SURGERY PATIENTS: If you need something for pain relief you may take 1 extra strength Tylenol (acetaminophen) AND 2 Ibuprofen (200mg  each) together every 4 hours as needed for pain. (do not  take these if you are allergic to them or if you have a reason you should not take them.) Typically, you may only need pain medication for 1 to 3 days.

## 2021-02-03 ENCOUNTER — Telehealth: Payer: Self-pay

## 2021-02-03 NOTE — Telephone Encounter (Signed)
Left pt message to call for bx result/sh 

## 2021-02-03 NOTE — Telephone Encounter (Signed)
-----   Message from Brendolyn Patty, MD sent at 01/31/2021 12:06 PM EST ----- Skin , right upper arm BASAL CELL CARCINOMA, NODULAR PATTERN, BASE INVOLVED  BCC skin cancer- needs EDC

## 2021-02-04 NOTE — Telephone Encounter (Signed)
Informed pt of results and scheduled EDC.

## 2021-02-10 ENCOUNTER — Ambulatory Visit: Admission: RE | Admit: 2021-02-10 | Payer: Medicare HMO | Source: Ambulatory Visit

## 2021-02-12 DIAGNOSIS — F3162 Bipolar disorder, current episode mixed, moderate: Secondary | ICD-10-CM | POA: Diagnosis not present

## 2021-02-12 DIAGNOSIS — F410 Panic disorder [episodic paroxysmal anxiety] without agoraphobia: Secondary | ICD-10-CM | POA: Diagnosis not present

## 2021-02-12 DIAGNOSIS — E221 Hyperprolactinemia: Secondary | ICD-10-CM | POA: Diagnosis not present

## 2021-02-12 DIAGNOSIS — F1010 Alcohol abuse, uncomplicated: Secondary | ICD-10-CM | POA: Diagnosis not present

## 2021-02-14 ENCOUNTER — Inpatient Hospital Stay: Payer: Medicare HMO | Admitting: Oncology

## 2021-02-14 ENCOUNTER — Inpatient Hospital Stay: Payer: Medicare HMO

## 2021-02-17 ENCOUNTER — Other Ambulatory Visit: Payer: Medicare HMO

## 2021-02-17 ENCOUNTER — Telehealth: Payer: Self-pay | Admitting: Physician Assistant

## 2021-02-17 ENCOUNTER — Other Ambulatory Visit: Payer: Self-pay

## 2021-02-17 ENCOUNTER — Telehealth: Payer: Self-pay | Admitting: Urology

## 2021-02-17 DIAGNOSIS — R059 Cough, unspecified: Secondary | ICD-10-CM

## 2021-02-17 DIAGNOSIS — E291 Testicular hypofunction: Secondary | ICD-10-CM

## 2021-02-17 DIAGNOSIS — L111 Transient acantholytic dermatosis [Grover]: Secondary | ICD-10-CM

## 2021-02-17 DIAGNOSIS — F3160 Bipolar disorder, current episode mixed, unspecified: Secondary | ICD-10-CM

## 2021-02-17 DIAGNOSIS — F4310 Post-traumatic stress disorder, unspecified: Secondary | ICD-10-CM

## 2021-02-17 DIAGNOSIS — I10 Essential (primary) hypertension: Secondary | ICD-10-CM

## 2021-02-17 DIAGNOSIS — N5201 Erectile dysfunction due to arterial insufficiency: Secondary | ICD-10-CM | POA: Diagnosis not present

## 2021-02-17 DIAGNOSIS — K219 Gastro-esophageal reflux disease without esophagitis: Secondary | ICD-10-CM

## 2021-02-17 DIAGNOSIS — F410 Panic disorder [episodic paroxysmal anxiety] without agoraphobia: Secondary | ICD-10-CM

## 2021-02-17 NOTE — Telephone Encounter (Signed)
Pt would like a return call, he has some questions.  He didn't say what about, he was here for a lab appt and wanted to talk to [facility].  I told him clinic was full.

## 2021-02-17 NOTE — Telephone Encounter (Signed)
Requested medications are due for refill today.  unknown  Requested medications are on the active medications list.  no  Last refill.Medication was d/c  06/25/2020 by Marton Redwood  Future visit scheduled.   no  Notes to clinic.  D/C 06/25/2020 - please advise.

## 2021-02-17 NOTE — Telephone Encounter (Addendum)
[facility] faxed refill request for the following medications:  finasteride (PROSCAR) 5 MG tablet  metoprolol succinate (TOPROL-XL) 25 MG 24 hr tablet   montelukast (SINGULAIR) 10 MG tablet   DULoxetine (CYMBALTA) 60 MG capsule  triamcinolone (KENALOG) 0.1 %   omeprazole (PRILOSEC) 20 MG capsule  atorvastatin (LIPITOR) 40 MG tablet  amLODipine (NORVASC) 5 MG tablet  famotidine (PEPCID) 20 MG tablet  Naltrexone 50 MG Tablet  OLANZapine (ZYPREXA) 7.5 MG tablet  clonazePAM (KLONOPIN) 0.5 MG tablet    Please advise.

## 2021-02-18 ENCOUNTER — Telehealth: Payer: Self-pay

## 2021-02-18 LAB — TESTOSTERONE: Testosterone: 384 ng/dL (ref 264–916)

## 2021-02-18 LAB — HEMATOCRIT: Hematocrit: 42.8 % (ref 37.5–51.0)

## 2021-02-18 LAB — PSA: Prostate Specific Ag, Serum: 1.8 ng/mL (ref 0.0–4.0)

## 2021-02-18 NOTE — Telephone Encounter (Signed)
Route message

## 2021-02-19 ENCOUNTER — Ambulatory Visit: Payer: Medicare HMO | Admitting: Dermatology

## 2021-02-19 ENCOUNTER — Other Ambulatory Visit: Payer: Self-pay

## 2021-02-19 DIAGNOSIS — C4492 Squamous cell carcinoma of skin, unspecified: Secondary | ICD-10-CM

## 2021-02-19 DIAGNOSIS — C44329 Squamous cell carcinoma of skin of other parts of face: Secondary | ICD-10-CM | POA: Diagnosis not present

## 2021-02-19 DIAGNOSIS — D485 Neoplasm of uncertain behavior of skin: Secondary | ICD-10-CM

## 2021-02-19 DIAGNOSIS — L578 Other skin changes due to chronic exposure to nonionizing radiation: Secondary | ICD-10-CM

## 2021-02-19 DIAGNOSIS — L821 Other seborrheic keratosis: Secondary | ICD-10-CM | POA: Diagnosis not present

## 2021-02-19 HISTORY — DX: Squamous cell carcinoma of skin, unspecified: C44.92

## 2021-02-19 MED ORDER — MONTELUKAST SODIUM 10 MG PO TABS
10.0000 mg | ORAL_TABLET | Freq: Every day | ORAL | 1 refills | Status: DC
Start: 2021-02-19 — End: 2021-06-05

## 2021-02-19 MED ORDER — AMLODIPINE BESYLATE 5 MG PO TABS: 5.0000 mg | ORAL_TABLET | Freq: Every day | ORAL | 0 refills | Status: DC

## 2021-02-19 MED ORDER — DULOXETINE HCL 60 MG PO CPEP: 60.0000 mg | ORAL_CAPSULE | Freq: Every day | ORAL | 0 refills | Status: DC

## 2021-02-19 MED ORDER — OMEPRAZOLE 20 MG PO CPDR
20.0000 mg | DELAYED_RELEASE_CAPSULE | Freq: Two times a day (BID) | ORAL | 0 refills | Status: DC
Start: 2021-02-19 — End: 2021-04-08

## 2021-02-19 MED ORDER — ATORVASTATIN CALCIUM 40 MG PO TABS
40.0000 mg | ORAL_TABLET | Freq: Every day | ORAL | 4 refills | Status: DC
Start: 2021-02-19 — End: 2022-06-26

## 2021-02-19 MED ORDER — METOPROLOL SUCCINATE ER 25 MG PO TB24
25.0000 mg | ORAL_TABLET | Freq: Every day | ORAL | 1 refills | Status: DC
Start: 2021-02-19 — End: 2021-06-04

## 2021-02-19 MED ORDER — FAMOTIDINE 20 MG PO TABS: 20.0000 mg | ORAL_TABLET | Freq: Two times a day (BID) | ORAL | 0 refills | Status: DC

## 2021-02-19 NOTE — Telephone Encounter (Signed)
This is a former patient of Carles Collet, PA-C. Our office received the following refill request from [facility] mail order pharmacy. This pharmacy is not listed as patient's preferred pharmacy. Several of these medications are not prescribed by Adriana. I tried calling patient to confirm pharmacy and inform him of that. I looked back at patients last office visit with Adriana from 01/27/2021. It notes that she added Amlodipine due to uncontrolled blood pressure. Patient was supposed to follow up with Cardiology. I don't see that patient has followed up with Cardiology since that visit. I tried calling patient to schedule appointment with Dr. Caryn Section for a follow up. Left message to call back. If patient returns call, ok for [facility] triage to advise and schedule follow up appointment within the next week or so.

## 2021-02-19 NOTE — Patient Instructions (Addendum)

## 2021-02-19 NOTE — Progress Notes (Signed)
   Follow-Up Visit   Subjective  Russell Simpson is a 60 y.o. male who presents for the following: Cyst (Left mandible, not improved and increased pain. No drainage. Patient finished Omnicef x 1 week with no improvement.). H/o BCC R upper arm bx 01/28/21, not yet treated   The following portions of the chart were reviewed this encounter and updated as appropriate:       Review of Systems:  No other skin or systemic complaints except as noted in HPI or Assessment and Plan.  Objective  Well appearing patient in no apparent distress; mood and affect are within normal limits.  A focused examination was performed including face, neck. Relevant physical exam findings are noted in the Assessment and Plan.  Objective  Left Inferior Mandible: 1.2cm pink nodule with central plug      Assessment & Plan   Seborrheic Keratoses - Stuck-on, waxy, tan-brown papules, left malar cheek, 3.11mm - Discussed benign etiology and prognosis. - Observe - Call for any changes  Actinic Damage - chronic, secondary to cumulative UV radiation exposure/sun exposure over time - diffuse scaly erythematous macules with underlying dyspigmentation - Recommend daily broad spectrum sunscreen SPF 30+ to sun-exposed areas, reapply every 2 hours as needed.  - Recommend staying in the shade or wearing long sleeves, sun glasses (UVA+UVB protection) and wide brim hats (4-inch brim around the entire circumference of the hat). - Call for new or changing lesions.    Neoplasm of uncertain behavior of skin Left Inferior Mandible  Skin / nail biopsy Type of biopsy: tangential   Informed consent: discussed and consent obtained   Anesthesia: the lesion was anesthetized in a standard fashion   Anesthesia comment:  Area prepped with alcohol Anesthetic:  1% lidocaine w/ epinephrine 1-100,000 buffered w/ 8.4% NaHCO3 Instrument used: flexible razor blade   Hemostasis achieved with: pressure, aluminum chloride and  electrodesiccation   Outcome: patient tolerated procedure well    Destruction of lesion  Destruction method: electrodesiccation and curettage   Timeout:  patient name, date of birth, surgical site, and procedure verified Curettage performed in three different directions: Yes   Electrodesiccation performed over the curetted area: Yes   Curettage cycles:  3 Lesion length (cm):  1.2 Lesion width (cm):  1.2 Margin per side (cm):  0.1 Final wound size (cm):  1.4 Hemostasis achieved with:  pressure, aluminum chloride and electrodesiccation Outcome: patient tolerated procedure well with no complications   Post-procedure details: wound care instructions given   Additional details:  Mupirocin ointment and Bandaid applied    Specimen 1 - Surgical pathology Differential Diagnosis: R/O SCC  Check Margins: No 1.2cm pink nodule with central plug    Return for as scheduled for EDC BCC R upper arm.   IJamesetta Orleans, CMA, am acting as scribe for Brendolyn Patty, MD .  Documentation: I have reviewed the above documentation for accuracy and completeness, and I agree with the above.  Brendolyn Patty MD

## 2021-02-20 ENCOUNTER — Ambulatory Visit: Payer: Medicare HMO | Admitting: Urology

## 2021-02-20 ENCOUNTER — Encounter: Payer: Self-pay | Admitting: Urology

## 2021-02-20 VITALS — BP 130/80 | HR 120 | Ht 74.0 in | Wt 234.0 lb

## 2021-02-20 DIAGNOSIS — E291 Testicular hypofunction: Secondary | ICD-10-CM | POA: Diagnosis not present

## 2021-02-20 DIAGNOSIS — N401 Enlarged prostate with lower urinary tract symptoms: Secondary | ICD-10-CM | POA: Diagnosis not present

## 2021-02-20 DIAGNOSIS — N5201 Erectile dysfunction due to arterial insufficiency: Secondary | ICD-10-CM | POA: Diagnosis not present

## 2021-02-20 NOTE — Progress Notes (Signed)
02/20/2021 2:17 PM   Russell Simpson 07-16-1961 [id number]  Referring provider: Trinna Post, PA-C [address] [place],  [place] [address]  Chief Complaint  Patient presents with  . 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 Arthritis   . Asthma   . Basal cell carcinoma 01/28/2021   R upper arm  . Brain tumor (benign) ([place])    benign pituitary neoplasm  . Chronic pain    right arm  . Depression   . Environmental and seasonal allergies   . Hypertension   . Pneumonia   . Sleep apnea    does not wear CPAP ; uses humidifier instead    Surgical History: Past Surgical History:  Procedure Laterality Date  . ANTERIOR CERVICAL DECOMP/DISCECTOMY FUSION N/A 06/17/2016   Procedure: ANTERIOR CERVICAL DECOMPRESSION FUSION, CERVICAL 3-4, CERVICAL 4-5 WITH INSTRUMENTATION AND ALLOGRAFT;  Surgeon: Phylliss Bob, MD;  Location: [facility];  Service: Orthopedics;  Laterality: N/A;  ANTERIOR CERVICAL DECOMPRESSION FUSION, CERVICAL 3-4, CERVICAL 4-5 WITH INSTRUMENTATION AND ALLOGRAFT  . BACK SURGERY    . NECK SURGERY  2009    Home Medications:  Allergies as of 02/20/2021      Reactions   Penicillins Anaphylaxis   Tetanus Toxoids Swelling    Lisinopril    Other reaction(s): Other (See Comments) Other reaction(s): Cough Other reaction(s): Cough Other reaction(s): Cough Other reaction(s): Cough   Losartan    Other reaction(s): Other (See Comments) Other reaction(s): Muscle Pain Other reaction(s): Muscle Pain Other reaction(s): Muscle Pain   Tetanus Toxoid    Codeine Nausea Only   Doxycycline Rash      Medication List       Accurate as of February 20, 2021  2:17 PM. If you have any questions, ask your nurse or doctor.        amLODipine 5 MG tablet Commonly known as: NORVASC Take 1 tablet (5 mg total) by mouth daily.   aspirin EC 81 MG tablet Take 81 mg by mouth daily. Swallow whole.   atorvastatin 40 MG tablet Commonly known as: LIPITOR Take 1 tablet (40 mg total) by mouth daily.   Breztri Aerosphere 160-9-4.8 MCG/ACT Aero Generic drug: Budeson-Glycopyrrol-Formoterol Inhale 2 puffs into the lungs 2 (two) times daily.   clonazePAM 0.5 MG tablet Commonly known as: KLONOPIN   DULoxetine 60 MG capsule Commonly known as: CYMBALTA Take 1 capsule (60 mg total) by mouth daily.   Eucrisa 2 % Oint Generic drug: Crisaborole Apply  a small amount to affected area   qd/bid to aa's chest, inframammary, eye   famotidine 20 MG tablet Commonly known as: PEPCID Take 1 tablet (20 mg total) by mouth 2 (two) times daily.   finasteride 5 MG tablet Commonly known as: PROSCAR TAKE 1 TABLET BY MOUTH ONCE DAILY   fluticasone 50 MCG/ACT nasal spray Commonly  known as: FLONASE Place 1 spray into the nose daily.   lansoprazole 30 MG capsule Commonly known as: PREVACID TAKE 1 CAPSULE BY MOUTH ONCE DAILY AT NOON   loratadine 10 MG tablet Commonly known as: CLARITIN Take 10 mg by mouth daily.   metoprolol succinate 25 MG 24 hr tablet Commonly known as: TOPROL-XL Take 1 tablet (25 mg total) by mouth daily.   montelukast 10 MG tablet Commonly known as: SINGULAIR Take 1 tablet (10 mg total) by mouth at bedtime.    naproxen sodium 220 MG tablet Commonly known as: ALEVE Take 220 mg by mouth 2 (two) times daily with a meal.   OLANZapine 7.5 MG tablet Commonly known as: ZYPREXA Take 7.5 mg by mouth at bedtime.   omeprazole 20 MG capsule Commonly known as: PRILOSEC Take 1 capsule (20 mg total) by mouth 2 (two) times daily before a meal.   tacrolimus 0.1 % ointment Commonly known as: PROTOPIC Apply 1 application topically 2 (two) times daily.   tadalafil 20 MG tablet Commonly known as: CIALIS 1 tab 1 hour prior to intercourse   testosterone cypionate 200 MG/ML injection Commonly known as: DEPOTESTOSTERONE CYPIONATE Inject 0.4 mLs (80 mg total) into the muscle once a week.   triamcinolone 0.1 % Commonly known as: KENALOG Apply to affected areas 1-2 times a day until rash improved. Avoid face, groin, underarms.       Allergies:  Allergies  Allergen Reactions  . Penicillins Anaphylaxis  . Tetanus Toxoids Swelling  . Lisinopril     Other reaction(s): Other (See Comments) Other reaction(s): Cough Other reaction(s): Cough Other reaction(s): Cough Other reaction(s): Cough  . Losartan     Other reaction(s): Other (See Comments) Other reaction(s): Muscle Pain Other reaction(s): Muscle Pain Other reaction(s): Muscle Pain  . Tetanus Toxoid   . Codeine Nausea Only  . Doxycycline Rash    Family History: Family History  Problem Relation Age of Onset  . Diabetes Mother   . Diabetes Other     Social History:  reports that he has been smoking cigars. He has smoked for the past 20.00 years. He has never used smokeless tobacco. He reports previous alcohol use. He reports that he does not use drugs.   Physical Exam: BP 130/80   Pulse (!) 120   Ht 6\' 2"  (1.88 m)   Wt 234 lb (106.1 kg)   BMI 30.04 kg/m   Constitutional:  Alert and oriented, No acute distress. HEENT: [place] AT, moist mucus membranes.  Trachea midline, no masses. Cardiovascular: No clubbing, cyanosis, or  edema. Respiratory: Normal respiratory effort, no increased work of breathing. GU: No CVA tenderness Neurologic: Grossly intact, no focal deficits, moving all 4 extremities. Psychiatric: Normal mood and affect.   Assessment & Plan:    1.  Hypogonadism  Stable on TRT  61-month follow-up testosterone, hematocrit  1 year office visit testosterone, hematocrit, DRE  2.  Erectile dysfunction  Second line options of intracavernosal injections and vacuum erection devices were discussed.  He was provided literature and will call back if he desires to proceed  3.  BPH with LUTS  Stable   Abbie Sons, MD  [facility] [address] [place], [place] [address] [phone]

## 2021-02-22 ENCOUNTER — Encounter: Payer: Self-pay | Admitting: Urology

## 2021-02-22 MED ORDER — TESTOSTERONE CYPIONATE 200 MG/ML IM SOLN: 200.0000 mg | INTRAMUSCULAR | 0 refills | Status: DC

## 2021-02-24 ENCOUNTER — Telehealth: Payer: Self-pay

## 2021-02-24 ENCOUNTER — Other Ambulatory Visit: Payer: Self-pay | Admitting: Physician Assistant

## 2021-02-24 DIAGNOSIS — L111 Transient acantholytic dermatosis [Grover]: Secondary | ICD-10-CM

## 2021-02-24 NOTE — Telephone Encounter (Signed)
Medication Refill - Medication: clonazePAM (KLONOPIN) 0.5 MG tablet   finasteride (PROSCAR) 5 MG tablet  OLANZapine (ZYPREXA) 7.5 MG tablet   triamcinolone (KENALOG) 0.1 %  Naltrexone 50MG   Has the patient contacted their pharmacy? Yes.   (Agent: If no, request that the patient contact the pharmacy for the refill.) (Agent: If yes, when and what did the pharmacy advise?)Pt is new to [place]   Preferred Pharmacy (with phone number or street name):   [facility], [place] Phone:  [phone]  Fax:  [phone]       Agent: Please be advised that RX refills may take up to 3 business days. We ask that you follow-up with your pharmacy.

## 2021-02-24 NOTE — Telephone Encounter (Signed)
-----   Message from Brendolyn Patty, MD sent at 02/24/2021  1:32 PM EDT ----- Skin , left inf mandible, shave WELL DIFFERENTIATED SQUAMOUS CELL CARCINOMA, BASE INVOLVED  SCC skin cancer- already treated with EDC at time of biopsy

## 2021-02-24 NOTE — Telephone Encounter (Signed)
Notes to clinic:  medications been requested have been filled by historical providers Review for continued use and refill    Requested Prescriptions  Pending Prescriptions Disp Refills   clonazePAM (KLONOPIN) 0.5 MG tablet 30 tablet       Not Delegated - Psychiatry:  Anxiolytics/Hypnotics Failed - 02/24/2021  3:18 PM      Failed - This refill cannot be delegated      Failed - Urine Drug Screen completed in last 360 days      Passed - Valid encounter within last 6 months    Recent Outpatient Visits           4 weeks ago Syncope and collapse   [facility] Trinna Post, PA-C   2 months ago No-show for appointment   [facility] Carles Collet M, PA-C   9 months ago Mixed bipolar I disorder [facility])   [facility] Carles Collet M, [place]   11 months ago Cough   [place], PA-C   1 year ago [place], Wendee Beavers, [place]       Future Appointments             In 1 week Brendolyn Patty, MD [place]   In 3 months Caryn Section, Kirstie Peri, MD [facility]   In 12 months Stoio[place], Ronda Fairly, MD [facility]               finasteride (PROSCAR) 5 MG tablet 90 tablet 2    Sig: Take 1 tablet (5 mg total) by mouth daily.      Urology: 5-alpha Reductase Inhibitors Passed - 02/24/2021  3:18 PM      Passed - Valid encounter within last 12 months    Recent Outpatient Visits           4 weeks ago Syncope and collapse   [facility] Trinna Post, [place]   2 months ago No-show for appointment   [facility] Carles Collet M, [place]   9 months ago Mixed bipolar I disorder [facility])   [facility] Carles Collet M, [place]   11 months ago Cough   [place], Vickki Muff, PA-C   1 year ago [place], [place], [place]       Future  Appointments             In 1 week Brendolyn Patty, MD [place]   In 3 months Caryn Section, Kirstie Peri, MD [facility], [facility]   In 12 months Stoioff, Ronda Fairly, MD [facility]               OLANZapine (ZYPREXA) 7.5 MG tablet      Sig: Take 1 tablet (7.5 mg total) by mouth at bedtime.      Not Delegated - Psychiatry:  Antipsychotics - Second Generation (Atypical) - olanzapine Failed - 02/24/2021  3:18 PM      Failed - This refill cannot be delegated      Passed - ALT in normal range and within 360 days    ALT  Date Value Ref Range Status  12/10/2020 26 0 - 44 U/L Final   SGPT (ALT)  Date Value Ref Range Status  07/19/2013 39 12 - 78 U/L Final          Passed - AST in normal range and within  360 days    AST  Date Value Ref Range Status  12/10/2020 27 15 - 41 U/L Final   SGOT(AST)  Date Value Ref Range Status  07/19/2013 40 (H) 15 - 37 Unit/L Final          Passed - Last BP in normal range    BP Readings from Last 1 Encounters:  02/20/21 130/80          Passed - Valid encounter within last 6 months    Recent Outpatient Visits           4 weeks ago Syncope and collapse   [facility] [place], Wendee Beavers, PA-C   2 months ago No-show for appointment   [facility] Carles Collet M, [place]   9 months ago Mixed bipolar I disorder [facility])   [facility] Carles Collet M, [place]   11 months ago Cough   [place], Vickki Muff, PA-C   1 year ago [place], Wendee Beavers, [place]       Future Appointments             In 1 week Brendolyn Patty, MD [facility]   In 3 months [place], Kirstie Peri, MD [facility]   In 12 months Stoioff, Ronda Fairly, MD [facility]               triamcinolone (KENALOG) 0.1 % 80 g 1    Sig: Apply to affected areas 1-2 times a day until rash improved. Avoid face, groin,  underarms.      Dermatology:  Corticosteroids Passed - 02/24/2021  3:18 PM      Passed - Valid encounter within last 12 months    Recent Outpatient Visits           4 weeks ago Syncope and collapse   [facility] Trinna Post, [place]   2 months ago No-show for appointment   [place], [place]   9 months ago Mixed bipolar I disorder [facility])   [place], [place]   11 months ago Cough   [facility], Vickki Muff, PA-C   1 year ago [place], Wendee Beavers, [place]       Future Appointments             In 1 week Brendolyn Patty, MD [place]   In 3 months [place], Kirstie Peri, MD [facility], [place]   In 12 months [place], Ronda Fairly, [place]

## 2021-02-24 NOTE — Telephone Encounter (Signed)
Discussed biopsy results with pt  °

## 2021-02-24 NOTE — Telephone Encounter (Signed)
The ketoconazole is prescribed by his dermatologist and the rest are prescribed by his psychiatrist.

## 2021-02-25 ENCOUNTER — Other Ambulatory Visit: Payer: Self-pay

## 2021-02-25 ENCOUNTER — Ambulatory Visit: Admission: RE | Admit: 2021-02-25 | Payer: Medicare HMO | Source: Home / Self Care | Admitting: Oncology

## 2021-02-25 ENCOUNTER — Ambulatory Visit
Admission: RE | Admit: 2021-02-25 | Discharge: 2021-02-25 | Disposition: A | Discharge status: Home / Self Care | Payer: Medicare HMO | Source: Ambulatory Visit | Attending: Oncology | Admitting: Oncology

## 2021-02-25 DIAGNOSIS — R590 Localized enlarged lymph nodes: Secondary | ICD-10-CM | POA: Diagnosis not present

## 2021-02-25 DIAGNOSIS — R161 Splenomegaly, not elsewhere classified: Secondary | ICD-10-CM | POA: Diagnosis not present

## 2021-02-25 DIAGNOSIS — C8202 Follicular lymphoma grade I, intrathoracic lymph nodes: Secondary | ICD-10-CM

## 2021-02-25 DIAGNOSIS — I700 Atherosclerosis of aorta: Secondary | ICD-10-CM | POA: Diagnosis not present

## 2021-02-25 DIAGNOSIS — C8290 Follicular lymphoma, unspecified, unspecified site: Secondary | ICD-10-CM | POA: Diagnosis not present

## 2021-02-25 DIAGNOSIS — I2510 Atherosclerotic heart disease of native coronary artery without angina pectoris: Secondary | ICD-10-CM | POA: Diagnosis not present

## 2021-02-25 LAB — POCT I-STAT CREATININE: Creatinine, Ser: 0.9 mg/dL (ref 0.61–1.24)

## 2021-02-25 MED ORDER — IOHEXOL 300 MG/ML  SOLN
100.0000 mL | Freq: Once | INTRAMUSCULAR | Status: AC | PRN
  Administered 2021-02-25: 100 mL via INTRAVENOUS

## 2021-02-26 ENCOUNTER — Other Ambulatory Visit: Payer: Self-pay | Admitting: Family Medicine

## 2021-02-26 ENCOUNTER — Encounter: Payer: Self-pay | Admitting: Family Medicine

## 2021-02-26 NOTE — Progress Notes (Unsigned)
Faxed refill request from [facility]

## 2021-02-27 ENCOUNTER — Inpatient Hospital Stay: Payer: Medicare HMO | Attending: Oncology | Admitting: Oncology

## 2021-02-27 ENCOUNTER — Inpatient Hospital Stay: Payer: Medicare HMO

## 2021-02-27 ENCOUNTER — Telehealth: Payer: Self-pay | Admitting: Oncology

## 2021-02-27 ENCOUNTER — Encounter: Payer: Self-pay | Admitting: Oncology

## 2021-02-27 VITALS — BP 130/84 | HR 106 | Temp 97.6°F | Wt 236.8 lb

## 2021-02-27 DIAGNOSIS — Z7982 Long term (current) use of aspirin: Secondary | ICD-10-CM | POA: Diagnosis not present

## 2021-02-27 DIAGNOSIS — Z8572 Personal history of non-Hodgkin lymphomas: Secondary | ICD-10-CM | POA: Insufficient documentation

## 2021-02-27 DIAGNOSIS — Z79899 Other long term (current) drug therapy: Secondary | ICD-10-CM | POA: Insufficient documentation

## 2021-02-27 DIAGNOSIS — E785 Hyperlipidemia, unspecified: Secondary | ICD-10-CM | POA: Insufficient documentation

## 2021-02-27 DIAGNOSIS — E291 Testicular hypofunction: Secondary | ICD-10-CM | POA: Insufficient documentation

## 2021-02-27 DIAGNOSIS — F17210 Nicotine dependence, cigarettes, uncomplicated: Secondary | ICD-10-CM | POA: Insufficient documentation

## 2021-02-27 DIAGNOSIS — I700 Atherosclerosis of aorta: Secondary | ICD-10-CM | POA: Insufficient documentation

## 2021-02-27 DIAGNOSIS — C8202 Follicular lymphoma grade I, intrathoracic lymph nodes: Secondary | ICD-10-CM

## 2021-02-27 DIAGNOSIS — I10 Essential (primary) hypertension: Secondary | ICD-10-CM | POA: Diagnosis not present

## 2021-02-27 LAB — CBC WITH DIFFERENTIAL/PLATELET
Abs Immature Granulocytes: 0.02 10*3/uL (ref 0.00–0.07)
Basophils Absolute: 0 10*3/uL (ref 0.0–0.1)
Basophils Relative: 0 %
Eosinophils Absolute: 0.1 10*3/uL (ref 0.0–0.5)
Eosinophils Relative: 1 %
HCT: 41.4 % (ref 39.0–52.0)
Hemoglobin: 14.7 g/dL (ref 13.0–17.0)
Immature Granulocytes: 0 %
Lymphocytes Relative: 56 %
Lymphs Abs: 4 10*3/uL (ref 0.7–4.0)
MCH: 31.8 pg (ref 26.0–34.0)
MCHC: 35.5 g/dL (ref 30.0–36.0)
MCV: 89.6 fL (ref 80.0–100.0)
Monocytes Absolute: 0.8 10*3/uL (ref 0.1–1.0)
Monocytes Relative: 10 %
Neutro Abs: 2.4 10*3/uL (ref 1.7–7.7)
Neutrophils Relative %: 33 %
Platelets: 91 10*3/uL — ABNORMAL LOW (ref 150–400)
RBC: 4.62 MIL/uL (ref 4.22–5.81)
RDW: 12.8 % (ref 11.5–15.5)
WBC: 7.3 10*3/uL (ref 4.0–10.5)
nRBC: 0 % (ref 0.0–0.2)

## 2021-02-27 LAB — COMPREHENSIVE METABOLIC PANEL
ALT: 22 U/L (ref 0–44)
AST: 28 U/L (ref 15–41)
Albumin: 4 g/dL (ref 3.5–5.0)
Alkaline Phosphatase: 140 U/L — ABNORMAL HIGH (ref 38–126)
Anion gap: 14 (ref 5–15)
BUN: 12 mg/dL (ref 6–20)
CO2: 24 mmol/L (ref 22–32)
Calcium: 8.9 mg/dL (ref 8.9–10.3)
Chloride: 98 mmol/L (ref 98–111)
Creatinine, Ser: 0.91 mg/dL (ref 0.61–1.24)
GFR, Estimated: >60 mL/min (ref >60)
Glucose, Bld: 112 mg/dL — ABNORMAL HIGH (ref 70–99)
Potassium: 4.3 mmol/L (ref 3.5–5.1)
Sodium: 136 mmol/L (ref 135–145)
Total Bilirubin: 0.8 mg/dL (ref 0.3–1.2)
Total Protein: 6.9 g/dL (ref 6.5–8.1)

## 2021-02-27 LAB — LACTATE DEHYDROGENASE: LDH: 149 U/L (ref 98–192)

## 2021-02-27 NOTE — Telephone Encounter (Signed)
Spoke with patient to make him aware of PET scan scheduled on 4/11. Provided him with instructions and location.

## 2021-02-28 ENCOUNTER — Other Ambulatory Visit: Payer: Self-pay | Admitting: *Deleted

## 2021-02-28 ENCOUNTER — Other Ambulatory Visit: Payer: Self-pay | Admitting: Student

## 2021-02-28 DIAGNOSIS — C8202 Follicular lymphoma grade I, intrathoracic lymph nodes: Secondary | ICD-10-CM

## 2021-03-02 NOTE — Progress Notes (Signed)
Hematology/Oncology Consult note [facility]  Telephone:([phone] [phone]  Patient Care Team: Jerrol Banana., MD as PCP - General (Family Medicine) Kate Sable, MD as PCP - Cardiology (Cardiology)   Name of the patient: Russell Simpson  [id number]  1961-10-21   Date of visit: 03/02/21  Diagnosis-at least stage III low-grade follicular lymphoma  Chief complaint/ Reason for visit-routine follow-up of follicular lymphoma  Heme/Onc history: patient is a 60 year old male with a past medical history significant for hypertension, hyperlipidemia, hypogonadism, pituitary adenoma who recently had a fall on in December 2020.[name] He sustained a left anterior frontal sinus as well as supraorbital rim fracture on 11/21/2019 which was managed conservatively. He then presented to the ER at [facility] with symptoms of right-sided chest wall pain this led to a CT PE which did not show any evidence of pulmonary embolism. He was noted to have bilateral symmetric axillary adenopathy measuring up to 1.8 cm. Scattered mediastinal adenopathy. Right paratracheal node measuring up to 1.2 cm. Prominent right costophrenic lymph node measuring up to 1 cm. Findings are nonspecific but could be seen with lymphoma versus systemic inflammatory disease such as sarcoidosis. Of note patient has had a prior CT scan for lung screening back in 2015 when he was not noted to have this adenopathy.  Repeat CT chest in August 2021 showed persistent mild nonbulky axillary and mediastinal adenopathy which had not changed significantly as compared to his prior CT in December 2020. Core biopsy of the axillary lymph nodes was consistent with low-grade follicular lymphoma grade 1 to2.   Interval history-patient. reports having occasional night sweats appetite is stable.  Denies any significant weight loss abdominal pain or lumps or bumps anywhere.  Does report ongoing fatigue  ECOG  PS- 1 Pain scale- 0   Review of systems- Review of Systems  Constitutional: Positive for malaise/fatigue and weight loss. Negative for chills and fever.  HENT: Negative for congestion, ear discharge and nosebleeds.   Eyes: Negative for blurred vision.  Respiratory: Negative for cough, hemoptysis, sputum production, shortness of breath and wheezing.   Cardiovascular: Negative for chest pain, palpitations, orthopnea and claudication.  Gastrointestinal: Negative for abdominal pain, blood in stool, constipation, diarrhea, heartburn, melena, nausea and vomiting.  Genitourinary: Negative for dysuria, flank pain, frequency, hematuria and urgency.  Musculoskeletal: Negative for back pain, joint pain and myalgias.  Skin: Negative for rash.  Neurological: Negative for dizziness, tingling, focal weakness, seizures, weakness and headaches.  Endo/Heme/Allergies: Does not bruise/bleed easily.  Psychiatric/Behavioral: Negative for depression and suicidal ideas. The patient does not have insomnia.        Allergies  Allergen Reactions  . Penicillins Anaphylaxis  . Tetanus Toxoids Swelling  . Lisinopril     Other reaction(s): Other (See Comments) Other reaction(s): Cough Other reaction(s): Cough Other reaction(s): Cough Other reaction(s): Cough  . Losartan     Other reaction(s): Other (See Comments) Other reaction(s): Muscle Pain Other reaction(s): Muscle Pain Other reaction(s): Muscle Pain  . Tetanus Toxoid   . Codeine Nausea Only and Nausea And Vomiting  . Doxycycline Rash     Past Medical History:  Diagnosis Date  . 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 Arthritis   . Asthma   . Basal cell carcinoma 01/28/2021   R upper arm  . Brain tumor (benign) ([place])    benign pituitary neoplasm  . Chronic pain    right arm  . Depression   . Environmental and seasonal allergies   .  Hypertension   . Pneumonia   . Sleep apnea    does not wear CPAP ; uses humidifier instead  . Squamous cell  carcinoma of skin 02/19/2021   L inferior mandible, treated with [facility]      Past Surgical History:  Procedure Laterality Date  . ANTERIOR CERVICAL DECOMP/DISCECTOMY FUSION N/A 06/17/2016   Procedure: ANTERIOR CERVICAL DECOMPRESSION FUSION, CERVICAL 3-4, CERVICAL 4-5 WITH INSTRUMENTATION AND ALLOGRAFT;  Surgeon: Phylliss Bob, MD;  Location: [place];  Service: Orthopedics;  Laterality: N/A;  ANTERIOR CERVICAL DECOMPRESSION FUSION, CERVICAL 3-4, CERVICAL 4-5 WITH INSTRUMENTATION AND ALLOGRAFT  . BACK SURGERY    . NECK SURGERY  2009    Social History   Socioeconomic History  . Marital status: Divorced    Spouse name: Not on file  . Number of children: Not on file  . Years of education: Not on file  . Highest education level: Not on file  Occupational History  . Not on file  Tobacco Use  . Smoking status: Current Every Day Smoker    Years: 20.00    Types: Cigars  . Smokeless tobacco: Never Used  . Tobacco comment: about 15 cigars/evening  Vaping Use  . Vaping Use: Former  . Start date: 04/30/2017  . Quit date: 11/30/2017  Substance and Sexual Activity  . Alcohol use: Not Currently    Alcohol/week: 0.0 standard drinks    Comment: 12 pack a beer a night  . Drug use: No  . Sexual activity: Not Currently  Other Topics Concern  . Not on file  Social History Narrative  . Not on file   Social Determinants of Health   Financial Resource Strain: Not on file  Food Insecurity: Not on file  Transportation Needs: Not on file  Physical Activity: Not on file  Stress: Not on file  Social Connections: Not on file  Intimate Partner Violence: Not on file    Family History  Problem Relation Age of Onset  . Diabetes Mother   . Diabetes Other      Current Outpatient Medications:  .  amLODipine (NORVASC) 5 MG tablet, Take 1 tablet (5 mg total) by mouth daily., Disp: 90 tablet, Rfl: 0 .  aspirin EC 81 MG tablet, Take 81 mg by mouth daily. Swallow whole., Disp: , Rfl:  .  atorvastatin  (LIPITOR) 40 MG tablet, Take 1 tablet (40 mg total) by mouth daily., Disp: 90 tablet, Rfl: 4 .  Budeson-Glycopyrrol-Formoterol (BREZTRI AEROSPHERE) 160-9-4.8 MCG/ACT AERO, Inhale 2 puffs into the lungs 2 (two) times daily., Disp: 5.9 g, Rfl: 0 .  clonazePAM (KLONOPIN) 0.5 MG tablet, Take 0.5 mg by mouth daily., Disp: , Rfl:  .  DULoxetine (CYMBALTA) 60 MG capsule, Take 1 capsule (60 mg total) by mouth daily., Disp: 90 capsule, Rfl: 0 .  EUCRISA 2 % OINT, Apply  a small amount to affected area   qd/bid to aa's chest, inframammary, eye, Disp: , Rfl:  .  famotidine (PEPCID) 20 MG tablet, Take 1 tablet (20 mg total) by mouth 2 (two) times daily., Disp: 180 tablet, Rfl: 0 .  finasteride (PROSCAR) 5 MG tablet, TAKE 1 TABLET BY MOUTH ONCE DAILY, Disp: 90 tablet, Rfl: 2 .  fluticasone (FLONASE) 50 MCG/ACT nasal spray, Place 1 spray into the nose daily. , Disp: , Rfl:  .  lansoprazole (PREVACID) 30 MG capsule, TAKE 1 CAPSULE BY MOUTH ONCE DAILY AT NOON, Disp: 30 capsule, Rfl: 3 .  loratadine (CLARITIN) 10 MG tablet, Take 10 mg by mouth  daily. , Disp: , Rfl:  .  metoprolol succinate (TOPROL-XL) 25 MG 24 hr tablet, Take 1 tablet (25 mg total) by mouth daily., Disp: 90 tablet, Rfl: 1 .  modafinil (PROVIGIL) 200 MG tablet, Take 200 mg by mouth daily., Disp: , Rfl:  .  montelukast (SINGULAIR) 10 MG tablet, Take 1 tablet (10 mg total) by mouth at bedtime., Disp: 90 tablet, Rfl: 1 .  naproxen sodium (ANAPROX) 220 MG tablet, Take 220 mg by mouth 2 (two) times daily with a meal., Disp: , Rfl:  .  OLANZapine (ZYPREXA) 7.5 MG tablet, Take 7.5 mg by mouth at bedtime., Disp: , Rfl:  .  omeprazole (PRILOSEC) 20 MG capsule, Take 1 capsule (20 mg total) by mouth 2 (two) times daily before a meal., Disp: 180 capsule, Rfl: 0 .  tacrolimus (PROTOPIC) 0.1 % ointment, Apply 1 application topically 2 (two) times daily., Disp: , Rfl:  .  testosterone cypionate (DEPOTESTOSTERONE CYPIONATE) 200 MG/ML injection, Inject 1 mL (200 mg  total) into the muscle every 14 (fourteen) days., Disp: 10 mL, Rfl: 0 .  triamcinolone (KENALOG) 0.1 %, Apply to affected areas 1-2 times a day until rash improved. Avoid face, groin, underarms., Disp: 80 g, Rfl: 1 .  naltrexone (DEPADE) 50 MG tablet, , Disp: , Rfl:  .  tadalafil (CIALIS) 20 MG tablet, 1 tab 1 hour prior to intercourse (Patient not taking: Reported on 02/27/2021), Disp: 10 tablet, Rfl: 0  Physical exam:  Vitals:   02/27/21 1416  BP: 130/84  Pulse: (!) 106  Temp: 97.6 F (36.4 C)  TempSrc: Tympanic  SpO2: 94%  Weight: 236 lb 12.8 oz (107.4 kg)   Physical Exam Constitutional:      General: He is not in acute distress. Cardiovascular:     Rate and Rhythm: Normal rate and regular rhythm.     Heart sounds: Normal heart sounds.  Pulmonary:     Effort: Pulmonary effort is normal.     Breath sounds: Normal breath sounds.  Abdominal:     General: Bowel sounds are normal.     Palpations: Abdomen is soft.     Comments: Spleen tip palpable  Lymphadenopathy:     Comments: No palpable cervical, supraclavicular, axillary or inguinal adenopathy   Skin:    General: Skin is warm and dry.  Neurological:     Mental Status: He is alert and oriented to person, place, and time.      CMP Latest Ref Rng & Units 02/27/2021  Glucose 70 - 99 mg/dL 112(H)  BUN 6 - 20 mg/dL 12  Creatinine 0.61 - 1.24 mg/dL 0.91  Sodium 135 - 145 mmol/L 136  Potassium 3.5 - 5.1 mmol/L 4.3  Chloride 98 - 111 mmol/L 98  CO2 22 - 32 mmol/L 24  Calcium 8.9 - 10.3 mg/dL 8.9  Total Protein 6.5 - 8.1 g/dL 6.9  Total Bilirubin 0.3 - 1.2 mg/dL 0.8  Alkaline Phos 38 - 126 U/L 140(H)  AST 15 - 41 U/L 28  ALT 0 - 44 U/L 22   CBC Latest Ref Rng & Units 02/27/2021  WBC 4.0 - 10.5 K/uL 7.3  Hemoglobin 13.0 - 17.0 g/dL 14.7  Hematocrit 39.0 - 52.0 % 41.4  Platelets 150 - 400 K/uL 91(L)    No images are attached to the encounter.  CT CHEST ABDOMEN PELVIS W CONTRAST  Result Date: 02/26/2021 CLINICAL  DATA:  Follicular lymphoma.  Restaging exam. EXAM: CT CHEST, ABDOMEN, AND PELVIS WITH CONTRAST TECHNIQUE: Multidetector CT imaging  of the chest, abdomen and pelvis was performed following the standard protocol during bolus administration of intravenous contrast. CONTRAST:  182mL OMNIPAQUE IOHEXOL 300 MG/ML  SOLN COMPARISON:  CT 08/14/2020, 07/11/2020 FINDINGS: CT CHEST FINDINGS Cardiovascular: Normal heart size. Coronary artery calcification and aortic atherosclerotic calcification. Mediastinum/Nodes: Prominent axial lymph nodes again noted. RIGHT axial lymph node measures 20 mm (image 24/series 2) compared to 17 mm. LEFT axillary lymph node on image 25 measures 18 mm compared to 13 mm. Multiple mediastinal lymph nodes which are minimally enlarged. RIGHT lower paratracheal node measures 11 mm compared to 12 mm. Lungs/Pleura: No suspicious nodularity. No pleural thickening. Airways normal. Musculoskeletal: No aggressive osseous lesion. CT ABDOMEN AND PELVIS FINDINGS Hepatobiliary: No focal hepatic lesion. No biliary ductal dilatation. Gallbladder is normal. Common bile duct is normal. Pancreas: Pancreas is normal. No ductal dilatation. No pancreatic inflammation. Spleen: Spleen is enlarged measuring a 20.4 cm in craniocaudad dimension compared to 14.5 cm on CT 08/14/2020 Adrenals/urinary tract: Adrenal glands and kidneys are normal. The ureters and bladder normal. Stomach/Bowel: Stomach, small bowel, appendix, and cecum are normal. The colon and rectosigmoid colon are normal. Vascular/Lymphatic: Mild intimal calcification abdominal aorta normal caliber. Periaortic retroperitoneal adenopathy again noted. Example lymph node position between the IVC and aorta measuring 31 mm (image 88/series 2) increased from 22 mm Iliac pelvic adenopathy again noted. RIGHT external iliac lymph node measures 25 mm (image 125) compared to 23 mm. LEFT common iliac lymph node measures 21 mm increased from 18 mm. Inguinal lymph nodes also  increased in size. Reproductive: Unremarkable Other: No free fluid. Musculoskeletal: No aggressive osseous lesion. IMPRESSION: Chest Impression: 1. Mild increase in bilateral axial lymphadenopathy. 2. No significant change in multiple mediastinal lymph nodes. 3. No pulmonary nodules. Abdomen / Pelvis Impression: 1. Interval increase in size of retroperitoneal and pelvic lymph nodes. 2. Interval increase in inguinal adenopathy. 3. Progressive splenomegaly. Electronically Signed   By: Suzy Bouchard M.D.   On: 02/26/2021 11:10     Assessment and plan- Patient is a 61 y.o. male with history of at least stage III low-grade follicular lymphoma here for routine follow-up  We have been monitoring patient's follicular lymphoma so far without needing any active treatment.  He did have a recent CT chest abdomen pelvis with contrast on 02/25/2021 which showed progressive splenomegaly to a size of 20.4 cm from a prior size of 14.5 cm.  Increase in periaortic retroperitoneal adenopathy to 3.1 cm from prior 2.2 cm.  Iliac pelvic adenopathy increased to 2.5 cm from prior 2.3 cm.  Left common iliac lymph node at 2.1 cm from prior 1.8 cm.  Also has baseline prominent axillary as well as mediastinal adenopathy.  Patient reports having significant night sweats.  Appetite and weight have remained stable.  He does have progressive thrombocytopenia with a platelet count of 91.  At this time it seems that his follicular lymphoma appears to be progressing and given his significant splenomegaly as well as increase in his adenopathy I would favor proceeding with treatment at this time.  I will obtain a baseline PET CT scan to make sure that there is no high-grade transformation and need for a repeat biopsy.  We will also obtain a bone marrow biopsy at this time.  If overall findings are consistent with his original follicular lymphoma having progressed, I will prefer proceeding with 6 cycles of bendamustine Rituxan given IV every 4  weeks  Discussed health and benefits of chemotherapy including all but not limited to nausea, vomiting,  low blood counts, risk of infections and hospitalization.  Patient is not vaccinated against Covid and is not interested in getting his vaccine at this time.  I did mention to him about protective value of evushield and he agrees to proceed with that.  Treatment will be given with a palliative intent.  Patient understands and agrees to proceed as planned.  I will see him following PET CT scan and bone marrow biopsy results   Total face to face encounter time for this patient visit was 40 min.     Visit Diagnosis 1. 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 Randa Evens, MD, MPH [facility] at [facility] [phone] 03/02/2021 3:33 PM

## 2021-03-03 ENCOUNTER — Ambulatory Visit
Admission: RE | Admit: 2021-03-03 | Discharge: 2021-03-03 | Disposition: A | Discharge status: Home / Self Care | Payer: Medicare HMO | Source: Ambulatory Visit | Attending: Oncology | Admitting: Oncology

## 2021-03-03 ENCOUNTER — Telehealth: Payer: Self-pay | Admitting: Adult Health

## 2021-03-03 ENCOUNTER — Other Ambulatory Visit: Payer: Self-pay

## 2021-03-03 DIAGNOSIS — C8202 Follicular lymphoma grade I, intrathoracic lymph nodes: Secondary | ICD-10-CM

## 2021-03-03 DIAGNOSIS — D696 Thrombocytopenia, unspecified: Secondary | ICD-10-CM | POA: Insufficient documentation

## 2021-03-03 DIAGNOSIS — F17290 Nicotine dependence, other tobacco product, uncomplicated: Secondary | ICD-10-CM | POA: Diagnosis not present

## 2021-03-03 DIAGNOSIS — Z85828 Personal history of other malignant neoplasm of skin: Secondary | ICD-10-CM | POA: Insufficient documentation

## 2021-03-03 DIAGNOSIS — C8290 Follicular lymphoma, unspecified, unspecified site: Secondary | ICD-10-CM | POA: Insufficient documentation

## 2021-03-03 DIAGNOSIS — C8599 Non-Hodgkin lymphoma, unspecified, extranodal and solid organ sites: Secondary | ICD-10-CM | POA: Diagnosis not present

## 2021-03-03 LAB — CBC WITH DIFFERENTIAL/PLATELET
Abs Immature Granulocytes: 0.02 10*3/uL (ref 0.00–0.07)
Basophils Absolute: 0 10*3/uL (ref 0.0–0.1)
Basophils Relative: 0 %
Eosinophils Absolute: 0.1 10*3/uL (ref 0.0–0.5)
Eosinophils Relative: 1 %
HCT: 42.7 % (ref 39.0–52.0)
Hemoglobin: 14.8 g/dL (ref 13.0–17.0)
Immature Granulocytes: 0 %
Lymphocytes Relative: 61 %
Lymphs Abs: 5.3 10*3/uL — ABNORMAL HIGH (ref 0.7–4.0)
MCH: 31.7 pg (ref 26.0–34.0)
MCHC: 34.7 g/dL (ref 30.0–36.0)
MCV: 91.4 fL (ref 80.0–100.0)
Monocytes Absolute: 0.8 10*3/uL (ref 0.1–1.0)
Monocytes Relative: 9 %
Neutro Abs: 2.5 10*3/uL (ref 1.7–7.7)
Neutrophils Relative %: 29 %
Platelets: 119 10*3/uL — ABNORMAL LOW (ref 150–400)
RBC: 4.67 MIL/uL (ref 4.22–5.81)
RDW: 13.3 % (ref 11.5–15.5)
WBC: 8.6 10*3/uL (ref 4.0–10.5)
nRBC: 0 % (ref 0.0–0.2)

## 2021-03-03 MED ORDER — FENTANYL CITRATE (PF) 100 MCG/2ML IJ SOLN
INTRAMUSCULAR | Status: AC | PRN
  Administered 2021-03-03 (×2): 50 ug via INTRAVENOUS

## 2021-03-03 MED ORDER — SODIUM CHLORIDE 0.9 % IV SOLN: INTRAVENOUS | Status: DC

## 2021-03-03 MED ORDER — MIDAZOLAM HCL 2 MG/2ML IJ SOLN
INTRAMUSCULAR | Status: AC | PRN
  Administered 2021-03-03 (×2): 1 mg via INTRAVENOUS

## 2021-03-03 NOTE — H&P (Signed)
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 Request is for bone marrow biopsy  Referring Physician(s): Greenwood C  Supervising Physician: Corrie Mckusick  Patient Status: [facility] - Out-pt  History of Present Illness: Russell Simpson is a 60 y.o. male 60 y.o. male outpatient. History of HTN, HLD, Pituitary Adenoma., basal cell carcinoma,  Found to have bilateral symmetrical adenopathy while being worked up right sided chest wall pain. 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 CT CAP shows splenomegaly with an increases in adenopathy. Team is requesting a bone marrow biopsy for further evaluation of progression. Currently without any significant complaints. Patient alert and laying in bed, calm and comfortable. Denies any fevers, headache, chest pain, SOB, cough, abdominal pain, nausea, vomiting or bleeding. Return precautions and treatment recommendations and follow-up discussed with the patient who is agreeable with the plan.    Past Medical History:  Diagnosis Date  . 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 Arthritis   . Asthma   . Basal cell carcinoma 01/28/2021   R upper arm  . Brain tumor (benign) ([place])    benign pituitary neoplasm  . Chronic pain    right arm  . Depression   . Environmental and seasonal allergies   . Hypertension   . Pneumonia   . Sleep apnea    does not wear CPAP ; uses humidifier instead  . 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 ANTERIOR CERVICAL DECOMP/DISCECTOMY FUSION N/A 06/17/2016   Procedure: ANTERIOR CERVICAL DECOMPRESSION FUSION, CERVICAL 3-4, CERVICAL 4-5 WITH INSTRUMENTATION AND ALLOGRAFT;  Surgeon: Phylliss Bob, MD;  Location: [place];  Service: Orthopedics;  Laterality: N/A;  ANTERIOR CERVICAL DECOMPRESSION FUSION, CERVICAL 3-4, CERVICAL 4-5 WITH INSTRUMENTATION AND ALLOGRAFT  . BACK SURGERY    . NECK SURGERY  2009     Allergies: Penicillins, Tetanus toxoids, Lisinopril, Losartan, Tetanus toxoid, Codeine, and Doxycycline  Medications: Prior to Admission medications   Medication Sig Start Date End Date Taking? Authorizing Provider  amLODipine (NORVASC) 5 MG tablet Take 1 tablet (5 mg total) by mouth daily. 02/19/21  Yes Birdie Sons, MD  aspirin EC 81 MG tablet Take 81 mg by mouth daily. Swallow whole.   Yes [provider]  atorvastatin (LIPITOR) 40 MG tablet Take 1 tablet (40 mg total) by mouth daily. 02/19/21  Yes Birdie Sons, MD  Budeson-Glycopyrrol-Formoterol (BREZTRI AEROSPHERE) 160-9-4.8 MCG/ACT AERO Inhale 2 puffs into the lungs 2 (two) times daily. 06/25/20  Yes Tyler Pita, MD  clonazePAM (KLONOPIN) 0.5 MG tablet Take 0.5 mg by mouth daily.   Yes Ander Slade, PA-C  DULoxetine (CYMBALTA) 60 MG capsule Take 1 capsule (60 mg total) by mouth daily. 02/19/21  Yes Birdie Sons, MD  EUCRISA 2 % OINT Apply  a small amount to affected area   qd/bid to aa's chest, inframammary, eye 08/30/19  Yes [provider]  famotidine (PEPCID) 20 MG tablet Take 1 tablet (20 mg total) by mouth 2 (two) times daily. 02/19/21  Yes Birdie Sons, MD  finasteride (PROSCAR) 5 MG tablet TAKE 1 TABLET BY MOUTH ONCE DAILY 09/12/20  Yes Stoioff, Ronda Fairly, MD  fluticasone (FLONASE) 50 MCG/ACT nasal spray Place 1 spray into the nose daily.  01/06/14  Yes [provider]  lansoprazole (PREVACID) 30 MG capsule TAKE 1 CAPSULE BY MOUTH ONCE DAILY AT NOON 10/16/20  Yes Tyler Pita, MD  loratadine (CLARITIN) 10 MG tablet Take 10 mg by mouth daily.  12/18/14  Yes [provider]  metoprolol succinate (TOPROL-XL) 25 MG 24 hr tablet Take 1 tablet (25 mg total) by mouth daily. 02/19/21  Yes Birdie Sons, MD  modafinil (PROVIGIL) 200 MG tablet Take 200 mg by mouth daily.   Yes [provider]  montelukast (SINGULAIR) 10 MG tablet Take 1 tablet (10 mg total) by mouth at  bedtime. 02/19/21  Yes Birdie Sons, MD  naltrexone (DEPADE) 50 MG tablet  10/28/20  Yes [provider]  naproxen sodium (ANAPROX) 220 MG tablet Take 220 mg by mouth 2 (two) times daily with a meal.   Yes [provider]  OLANZapine (ZYPREXA) 7.5 MG tablet Take 7.5 mg by mouth at bedtime.   Yes [provider]  omeprazole (PRILOSEC) 20 MG capsule Take 1 capsule (20 mg total) by mouth 2 (two) times daily before a meal. 02/19/21  Yes Birdie Sons, MD  tacrolimus (PROTOPIC) 0.1 % ointment Apply 1 application topically 2 (two) times daily. 09/11/19  Yes [provider]  testosterone cypionate (DEPOTESTOSTERONE CYPIONATE) 200 MG/ML injection Inject 1 mL (200 mg total) into the muscle every 14 (fourteen) days. 02/22/21  Yes Stoioff, Ronda Fairly, MD  triamcinolone (KENALOG) 0.1 % Apply to affected areas 1-2 times a day until rash improved. Avoid face, groin, underarms. 01/28/21  Yes Brendolyn Patty, MD  tadalafil (CIALIS) 20 MG tablet 1 tab 1 hour prior to intercourse Patient not taking: Reported on 02/27/2021 08/23/20   Abbie Sons, MD     Family History  Problem Relation Age of Onset  . Diabetes Mother   . Diabetes Other     Social History   Socioeconomic History  . Marital status: Divorced    Spouse name: Not on file  . Number of children: Not on file  . Years of education: Not on file  . Highest education level: Not on file  Occupational History  . Not on file  Tobacco Use  . Smoking status: Current Every Day Smoker    Years: 20.00    Types: Cigars  . Smokeless tobacco: Never Used  . Tobacco comment: about 15 cigars/evening  Vaping Use  . Vaping Use: Former  . Start date: 04/30/2017  . Quit date: 11/30/2017  Substance and Sexual Activity  . Alcohol use: Not Currently    Alcohol/week: 0.0 standard drinks    Comment: 12 pack a beer a night  . Drug use: No  . Sexual activity: Not Currently  Other Topics Concern  . Not on file  Social History  Narrative  . Not on file   Social Determinants of Health   Financial Resource Strain: Not on file  Food Insecurity: Not on file  Transportation Needs: Not on file  Physical Activity: Not on file  Stress: Not on file  Social Connections: Not on file    Review of Systems: A 12 point ROS discussed and pertinent positives are indicated in the HPI above.  All other systems are negative.  Review of Systems  Constitutional: Negative for fever.  HENT: Negative for congestion.   Respiratory: Negative for cough and shortness of breath.   Cardiovascular: Negative for chest pain.  Gastrointestinal: Negative for abdominal pain.  Neurological: Negative for headaches.  Psychiatric/Behavioral: Negative for behavioral problems and confusion.    Vital Signs: There were no vitals taken for this visit.  Physical Exam Vitals and nursing note reviewed.  Constitutional:  Appearance: He is well-developed.  HENT:     Head: Normocephalic.  Cardiovascular:     Rate and Rhythm: Normal rate and regular rhythm.     Heart sounds: Normal heart sounds.  Pulmonary:     Effort: Pulmonary effort is normal.     Breath sounds: Normal breath sounds.  Musculoskeletal:        General: Normal range of motion.     Cervical back: Normal range of motion.  Skin:    General: Skin is dry.  Neurological:     Mental Status: He is alert and oriented to person, place, and time.     Imaging: CT CHEST ABDOMEN PELVIS W CONTRAST  Result Date: 02/26/2021 CLINICAL DATA:  Follicular lymphoma.  Restaging exam. EXAM: CT CHEST, ABDOMEN, AND PELVIS WITH CONTRAST TECHNIQUE: Multidetector CT imaging of the chest, abdomen and pelvis was performed following the standard protocol during bolus administration of intravenous contrast. CONTRAST:  134m OMNIPAQUE IOHEXOL 300 MG/ML  SOLN COMPARISON:  CT 08/14/2020, 07/11/2020 FINDINGS: CT CHEST FINDINGS Cardiovascular: Normal heart size. Coronary artery calcification and aortic  atherosclerotic calcification. Mediastinum/Nodes: Prominent axial lymph nodes again noted. RIGHT axial lymph node measures 20 mm (image 24/series 2) compared to 17 mm. LEFT axillary lymph node on image 25 measures 18 mm compared to 13 mm. Multiple mediastinal lymph nodes which are minimally enlarged. RIGHT lower paratracheal node measures 11 mm compared to 12 mm. Lungs/Pleura: No suspicious nodularity. No pleural thickening. Airways normal. Musculoskeletal: No aggressive osseous lesion. CT ABDOMEN AND PELVIS FINDINGS Hepatobiliary: No focal hepatic lesion. No biliary ductal dilatation. Gallbladder is normal. Common bile duct is normal. Pancreas: Pancreas is normal. No ductal dilatation. No pancreatic inflammation. Spleen: Spleen is enlarged measuring a 20.4 cm in craniocaudad dimension compared to 14.5 cm on CT 08/14/2020 Adrenals/urinary tract: Adrenal glands and kidneys are normal. The ureters and bladder normal. Stomach/Bowel: Stomach, small bowel, appendix, and cecum are normal. The colon and rectosigmoid colon are normal. Vascular/Lymphatic: Mild intimal calcification abdominal aorta normal caliber. Periaortic retroperitoneal adenopathy again noted. Example lymph node position between the IVC and aorta measuring 31 mm (image 88/series 2) increased from 22 mm Iliac pelvic adenopathy again noted. RIGHT external iliac lymph node measures 25 mm (image 125) compared to 23 mm. LEFT common iliac lymph node measures 21 mm increased from 18 mm. Inguinal lymph nodes also increased in size. Reproductive: Unremarkable Other: No free fluid. Musculoskeletal: No aggressive osseous lesion. IMPRESSION: Chest Impression: 1. Mild increase in bilateral axial lymphadenopathy. 2. No significant change in multiple mediastinal lymph nodes. 3. No pulmonary nodules. Abdomen / Pelvis Impression: 1. Interval increase in size of retroperitoneal and pelvic lymph nodes. 2. Interval increase in inguinal adenopathy. 3. Progressive  splenomegaly. Electronically Signed   By: SSuzy BouchardM.D.   On: 02/26/2021 11:10    Labs:  CBC: Recent Labs    07/25/20 0937 08/19/20 1601 09/02/20 1350 12/10/20 1403 02/17/21 1406 02/27/21 1351  WBC 8.1  --  7.5 8.8  --  7.3  HGB 18.6*  --  17.7* 15.7  --  14.7  HCT 53.7*   < > 49.4 44.9 42.8 41.4  PLT 130*  --  141* 125*  --  91*   < > = values in this interval not displayed.    COAGS: Recent Labs    07/25/20 0937  INR 1.0    BMP: Recent Labs    07/25/20 0937 12/10/20 1403 02/25/21 1451 02/27/21 1351  NA 135 138  --  136  K 4.2 3.8  --  4.3  CL 97* 101  --  98  CO2 24 27  --  24  GLUCOSE 103* 113*  --  112*  BUN 15 16  --  12  CALCIUM 9.3 8.6*  --  8.9  CREATININE 1.12 0.96 0.90 0.91  GFRNONAA >60 >60  --  >60  GFRAA >60  --   --   --     LIVER FUNCTION TESTS: Recent Labs    07/25/20 0937 12/10/20 1403 02/27/21 1351  BILITOT 1.5* 0.5 0.8  AST 41 27 28  ALT _0 ALKPHOS 100 119 140*  PROT 7.8 6.9 6.9  ALBUMIN 4.3 3.9 4.0    TUMOR MARKERS: No results for input(s): AFPTM, CEA, CA199, CHROMGRNA in the last 8760 hours.  Assessment and Plan:  60 y.o. male outpatient. History of HTN, HLD, pituitary adenoma, basal cell carcinoma,  Found to have bilateral symmetrical adenopathy while being worked up right sided chest wall pain. 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 CT CAP shows splenomegaly with an increases in adenopathy. Team is requesting a bone marrow biopsy for further evaluation of progression   CBC with diff pending. PLT from 3.31.22 - 91, Alkaline phosphatase 140. All other labs and medications are within acceptable parameters. Allergies include PCN, Codeine, Doxycycline. Patient has been NPO since midnight.   Risks and benefits of bone marrow biopsy was discussed with the patient and/or patient's family including, but not limited to bleeding, infection, damage to adjacent structures or low yield requiring additional  tests.  All of the questions were answered and there is agreement to proceed.  Consent signed and in chart.  Thank you for this interesting consult.  I greatly enjoyed meeting Majestic Brister and look forward to participating in their care.  A copy of this report was sent to the requesting provider on this date.  Electronically Signed: Jacqualine Mau, NP 03/03/2021, 8:43 AM   I spent a total of  30 Minutes   in face to face in clinical consultation, greater than 50% of which was counseling/coordinating care for bone marrow biopsy

## 2021-03-03 NOTE — Telephone Encounter (Signed)
Called patient to discuss the Evusheld referral placed by Dr. Janese Banks for [place] 19 pre exposure prophylaxis in immunosuppressed patients. Patient asked if I could return his call tomorrow.    Wilber Bihari, NP

## 2021-03-03 NOTE — Progress Notes (Signed)
Patient clinically stable post BMB per Dr Earleen Newport, tolerated well. Vitals stable pre and post procedure. Received Versed 2 mg along with Fentanyl 100 mcg IV for procedure. Denies complaints at this time.report given to [facility] post procedure/specials.

## 2021-03-03 NOTE — Procedures (Signed)
Interventional Radiology Procedure Note  Procedure: CT guided aspirate and core biopsy of right posterior iliac bone Complications: None Recommendations: - Bedrest supine x 1 hrs - OTC's PRN  Pain - Follow biopsy results  Signed,  Spence Soberano S. Felise Georgia, DO    

## 2021-03-04 ENCOUNTER — Encounter: Payer: Self-pay | Admitting: Dermatology

## 2021-03-04 ENCOUNTER — Telehealth: Payer: Self-pay | Admitting: Adult Health

## 2021-03-04 ENCOUNTER — Ambulatory Visit: Payer: Medicare HMO | Admitting: Dermatology

## 2021-03-04 ENCOUNTER — Telehealth: Payer: Self-pay | Admitting: *Deleted

## 2021-03-04 DIAGNOSIS — L820 Inflamed seborrheic keratosis: Secondary | ICD-10-CM

## 2021-03-04 DIAGNOSIS — L578 Other skin changes due to chronic exposure to nonionizing radiation: Secondary | ICD-10-CM | POA: Diagnosis not present

## 2021-03-04 DIAGNOSIS — Z85828 Personal history of other malignant neoplasm of skin: Secondary | ICD-10-CM | POA: Diagnosis not present

## 2021-03-04 DIAGNOSIS — C44612 Basal cell carcinoma of skin of right upper limb, including shoulder: Secondary | ICD-10-CM | POA: Diagnosis not present

## 2021-03-04 DIAGNOSIS — C8202 Follicular lymphoma grade I, intrathoracic lymph nodes: Secondary | ICD-10-CM

## 2021-03-04 NOTE — Progress Notes (Signed)
   Follow-Up Visit   Subjective  Russell Simpson is a 60 y.o. male who presents for the following: Procedure ([place], biopsy proven of the right upper arm. Patient here for [facility] today.) and Growth (Under left eye, new.).   The following portions of the chart were reviewed this encounter and updated as appropriate:       Review of Systems:  No other skin or systemic complaints except as noted in HPI or Assessment and Plan.  Objective  Well appearing patient in no apparent distress; mood and affect are within normal limits.  A focused examination was performed including face, arms. Relevant physical exam findings are noted in the Assessment and Plan.  Objective  Right Upper Arm: Pink biopsy site, 8.20mm  Objective  Left Malar Cheek: 3.17mm gray/brown macule, slightly waxy.  Objective  Left Inferior Mandible: Healing wound post EDC   Assessment & Plan  Basal cell carcinoma (BCC) of skin of right upper extremity including shoulder Right Upper Arm  Destruction of lesion  Destruction method: electrodesiccation and curettage   Informed consent: discussed and consent obtained   Timeout:  patient name, date of birth, surgical site, and procedure verified Anesthesia: the lesion was anesthetized in a standard fashion   Anesthetic:  1% lidocaine w/ epinephrine 1-100,000 buffered w/ 8.4% NaHCO3 Curettage performed in three different directions: Yes   Electrodesiccation performed over the curetted area: Yes   Lesion length (cm):  0.8 Lesion width (cm):  0.8 Margin per side (cm):  0.1 Final wound size (cm):  1 Hemostasis achieved with:  pressure, aluminum chloride and electrodesiccation Outcome: patient tolerated procedure well with no complications   Post-procedure details: wound care instructions given    Inflamed seborrheic keratosis Left Malar Cheek  Prior to procedure, discussed risks of blister formation, small wound, skin dyspigmentation, or rare scar following cryotherapy.    Recheck on follow-up.  Destruction of lesion - Left Malar Cheek  Destruction method: cryotherapy   Informed consent: discussed and consent obtained   Lesion destroyed using liquid nitrogen: Yes   Region frozen until ice ball extended beyond lesion: Yes   Outcome: patient tolerated procedure well with no complications   Post-procedure details: wound care instructions given    History of SCC (squamous cell carcinoma) of skin Left Inferior Mandible  Clear. Observe for recurrence. Call clinic for new or changing lesions.  Recommend regular skin exams, daily broad-spectrum spf 30+ sunscreen use, and photoprotection.    Actinic Damage - chronic, secondary to cumulative UV radiation exposure/sun exposure over time - diffuse scaly erythematous macules with underlying dyspigmentation - Recommend daily broad spectrum sunscreen SPF 30+ to sun-exposed areas, reapply every 2 hours as needed.  - Recommend staying in the shade or wearing long sleeves, sun glasses (UVA+UVB protection) and wide brim hats (4-inch brim around the entire circumference of the hat). - Call for new or changing lesions.  Return in about 6 months (around 09/03/2021) for Recheck left malar cheek, UBSE, h/o SCC/BCC.   Documentation: I have reviewed the above documentation for accuracy and completeness, and I agree with the above.  Brendolyn Patty MD

## 2021-03-04 NOTE — Telephone Encounter (Signed)
Called pt and asked him to come in for labs on 4/11 at 12:30 if he is ok with it because I was afraid if he came at 12:45  And there was already 3 people on for 12:45. I did not want him to be late for pet scan if there are pt's in front of him. I spoke to lab staff and they will be glad to do labs 12:30. He is agreeable.

## 2021-03-04 NOTE — Telephone Encounter (Signed)
Second and final attempt call made to [name] to discuss Evusheld for COVID 19 prevention.  I gave him my direct office # to return my call so that we can discuss further.    Russell Bihari, NP

## 2021-03-04 NOTE — Patient Instructions (Signed)

## 2021-03-05 LAB — SURGICAL PATHOLOGY

## 2021-03-06 ENCOUNTER — Telehealth: Payer: Self-pay | Admitting: Oncology

## 2021-03-06 NOTE — Telephone Encounter (Signed)
Left VM with patient to notify him of scheduled Evusheld injection previously discussed with Dr. Janese Banks. Left direct # to return call if there are any questions or schedule conflicts.

## 2021-03-10 ENCOUNTER — Encounter (HOSPITAL_COMMUNITY): Payer: Self-pay | Admitting: Oncology

## 2021-03-10 ENCOUNTER — Inpatient Hospital Stay: Payer: Medicare HMO | Attending: Oncology

## 2021-03-10 ENCOUNTER — Other Ambulatory Visit: Payer: Self-pay

## 2021-03-10 ENCOUNTER — Ambulatory Visit
Admission: RE | Admit: 2021-03-10 | Discharge: 2021-03-10 | Disposition: A | Discharge status: Home / Self Care | Payer: Medicare HMO | Source: Ambulatory Visit | Attending: Oncology | Admitting: Oncology

## 2021-03-10 DIAGNOSIS — B1910 Unspecified viral hepatitis B without hepatic coma: Secondary | ICD-10-CM | POA: Insufficient documentation

## 2021-03-10 DIAGNOSIS — K5730 Diverticulosis of large intestine without perforation or abscess without bleeding: Secondary | ICD-10-CM | POA: Insufficient documentation

## 2021-03-10 DIAGNOSIS — F17210 Nicotine dependence, cigarettes, uncomplicated: Secondary | ICD-10-CM | POA: Diagnosis not present

## 2021-03-10 DIAGNOSIS — C8202 Follicular lymphoma grade I, intrathoracic lymph nodes: Secondary | ICD-10-CM | POA: Diagnosis not present

## 2021-03-10 DIAGNOSIS — E785 Hyperlipidemia, unspecified: Secondary | ICD-10-CM | POA: Insufficient documentation

## 2021-03-10 DIAGNOSIS — C8208 Follicular lymphoma grade I, lymph nodes of multiple sites: Secondary | ICD-10-CM | POA: Insufficient documentation

## 2021-03-10 DIAGNOSIS — I700 Atherosclerosis of aorta: Secondary | ICD-10-CM | POA: Diagnosis not present

## 2021-03-10 DIAGNOSIS — Z298 Encounter for other specified prophylactic measures: Secondary | ICD-10-CM | POA: Insufficient documentation

## 2021-03-10 DIAGNOSIS — R161 Splenomegaly, not elsewhere classified: Secondary | ICD-10-CM | POA: Insufficient documentation

## 2021-03-10 DIAGNOSIS — E291 Testicular hypofunction: Secondary | ICD-10-CM | POA: Diagnosis not present

## 2021-03-10 DIAGNOSIS — I6523 Occlusion and stenosis of bilateral carotid arteries: Secondary | ICD-10-CM | POA: Insufficient documentation

## 2021-03-10 DIAGNOSIS — C8590 Non-Hodgkin lymphoma, unspecified, unspecified site: Secondary | ICD-10-CM | POA: Diagnosis not present

## 2021-03-10 DIAGNOSIS — Z79899 Other long term (current) drug therapy: Secondary | ICD-10-CM | POA: Diagnosis not present

## 2021-03-10 LAB — GLUCOSE, CAPILLARY: Glucose-Capillary: 122 mg/dL — ABNORMAL HIGH (ref 70–99)

## 2021-03-10 LAB — HEPATITIS B CORE ANTIBODY, TOTAL: Hep B Core Total Ab: REACTIVE — AB

## 2021-03-10 LAB — HEPATITIS B SURFACE ANTIGEN: Hepatitis B Surface Ag: NONREACTIVE

## 2021-03-10 MED ORDER — FLUDEOXYGLUCOSE F - 18 (FDG) INJECTION
12.2000 | Freq: Once | INTRAVENOUS | Status: AC | PRN
  Administered 2021-03-10: 12.83 via INTRAVENOUS

## 2021-03-11 ENCOUNTER — Telehealth: Payer: Self-pay | Admitting: Oncology

## 2021-03-11 LAB — HEPATITIS B SURFACE ANTIBODY, QUANTITATIVE: Hep B S AB Quant (Post): >1000 m[IU]/mL (ref >9.9)

## 2021-03-11 NOTE — Telephone Encounter (Signed)
Left additional VM with patient to remind him of his Evusheld injection tomorrow 4/13 at 11am.

## 2021-03-12 ENCOUNTER — Other Ambulatory Visit: Payer: Self-pay

## 2021-03-12 ENCOUNTER — Telehealth: Payer: Self-pay | Admitting: Oncology

## 2021-03-12 ENCOUNTER — Telehealth: Payer: Self-pay | Admitting: *Deleted

## 2021-03-12 ENCOUNTER — Inpatient Hospital Stay: Payer: Medicare HMO

## 2021-03-12 DIAGNOSIS — C8202 Follicular lymphoma grade I, intrathoracic lymph nodes: Secondary | ICD-10-CM

## 2021-03-12 DIAGNOSIS — R948 Abnormal results of function studies of other organs and systems: Secondary | ICD-10-CM

## 2021-03-12 DIAGNOSIS — C8395 Non-follicular (diffuse) lymphoma, unspecified, lymph nodes of inguinal region and lower limb: Secondary | ICD-10-CM

## 2021-03-12 NOTE — Telephone Encounter (Signed)
Patient left vm stating he will not be in today to get his evusheld inj.  Would like to reschedule and see Dr. Janese Banks on the same day as he needs an appt  to discuss results.  Routing to clinical team for follow up.

## 2021-03-12 NOTE — Telephone Encounter (Signed)
Called pt and left message that the PET scan showed some lymph nodes enlarged and they were bright on the film and mostly in inguinal which is groin area and she would like to get bx of that and wants to see if we can schedule it and then few days after have appt with Janese Banks or if he wants an appt to see the pt first. Please call me back with advice

## 2021-03-12 NOTE — Telephone Encounter (Signed)
I called the patient to clarify what exactly his message was telling me. I had left message earlier in the day to let him know that PET scan showed some lymph nodes and the biggest ones was inguinal area on both sides. Dr. Janese Banks spoke to Interventional doctor and they both feel that the lymph node should be biopsied. We wanted to go ahead and schedule that if pt. Was ok and then when we get results back we will call pt to come in and go over everything. Or if pt's wants we can make him appt for md to tell him in depth review of the scan and the biopsy and then we can send a request to have bx done and then have a appt for pt when results are back. Pt . Prefers to have bx set up, then call him when the results come back and get evusheld on same day. I told him that I will work on that and get back with him and we will proceed to the bx to be scheduled. He is agreeable to the plan

## 2021-03-14 ENCOUNTER — Telehealth: Payer: Self-pay | Admitting: *Deleted

## 2021-03-14 DIAGNOSIS — F3162 Bipolar disorder, current episode mixed, moderate: Secondary | ICD-10-CM | POA: Diagnosis not present

## 2021-03-14 DIAGNOSIS — F410 Panic disorder [episodic paroxysmal anxiety] without agoraphobia: Secondary | ICD-10-CM | POA: Diagnosis not present

## 2021-03-14 DIAGNOSIS — F1010 Alcohol abuse, uncomplicated: Secondary | ICD-10-CM | POA: Diagnosis not present

## 2021-03-14 NOTE — Telephone Encounter (Signed)
Called the pt to go over the biopsy next week. He will arrive 4/20 and [facility] 8:30 for a 9:30 bx. He will need to be NPO after midnight. Have a driver to bring him and take him home. Then his appt to see Janese Banks is 4/28 10 am to go over all things like results to determine plan. Also we added on the infusion evusheld so pt did not have extra visit to get it done. He is ok with all of this

## 2021-03-17 LAB — SURGICAL PATHOLOGY

## 2021-03-18 ENCOUNTER — Other Ambulatory Visit: Payer: Self-pay | Admitting: *Deleted

## 2021-03-18 ENCOUNTER — Other Ambulatory Visit: Payer: Self-pay | Admitting: Radiology

## 2021-03-18 DIAGNOSIS — E291 Testicular hypofunction: Secondary | ICD-10-CM

## 2021-03-19 ENCOUNTER — Other Ambulatory Visit: Payer: Self-pay

## 2021-03-19 ENCOUNTER — Ambulatory Visit
Admission: RE | Admit: 2021-03-19 | Discharge: 2021-03-19 | Disposition: A | Discharge status: Home / Self Care | Payer: Medicare HMO | Source: Ambulatory Visit | Attending: Oncology | Admitting: Oncology

## 2021-03-19 DIAGNOSIS — R948 Abnormal results of function studies of other organs and systems: Secondary | ICD-10-CM

## 2021-03-19 DIAGNOSIS — C8395 Non-follicular (diffuse) lymphoma, unspecified, lymph nodes of inguinal region and lower limb: Secondary | ICD-10-CM

## 2021-03-19 DIAGNOSIS — R590 Localized enlarged lymph nodes: Secondary | ICD-10-CM | POA: Insufficient documentation

## 2021-03-19 DIAGNOSIS — F17210 Nicotine dependence, cigarettes, uncomplicated: Secondary | ICD-10-CM | POA: Insufficient documentation

## 2021-03-19 DIAGNOSIS — C8204 Follicular lymphoma grade I, lymph nodes of axilla and upper limb: Secondary | ICD-10-CM | POA: Diagnosis not present

## 2021-03-19 MED ORDER — MIDAZOLAM HCL 2 MG/2ML IJ SOLN
INTRAMUSCULAR | Status: AC
  Filled 2021-03-19: qty 2

## 2021-03-19 MED ORDER — FENTANYL CITRATE (PF) 100 MCG/2ML IJ SOLN
INTRAMUSCULAR | Status: AC
  Filled 2021-03-19: qty 2

## 2021-03-19 MED ORDER — FENTANYL CITRATE (PF) 100 MCG/2ML IJ SOLN
INTRAMUSCULAR | Status: AC | PRN
  Administered 2021-03-19 (×2): 50 ug via INTRAVENOUS

## 2021-03-19 MED ORDER — MIDAZOLAM HCL 2 MG/2ML IJ SOLN
INTRAMUSCULAR | Status: AC | PRN
  Administered 2021-03-19 (×2): 1 mg via INTRAVENOUS

## 2021-03-19 MED ORDER — SODIUM CHLORIDE 0.9 % IV SOLN: INTRAVENOUS | Status: DC

## 2021-03-19 NOTE — H&P (Signed)
Chief Complaint: Patient was seen today for inguinal lymph node biopsy at the request of Rao,Archana C  Referring Physician(s): Rao,Archana C  Patient Status: [facility] - Out-pt  History of Present Illness: Russell Simpson is a 60 y.o. male known to our service from prior axillary lymph node biopsy on 07/25/20 and bone marrow biopsy recently on 03/03/21. History of known grade I follicular lymphoma but with evidence of probable disease progression by recent CT and PET. Lymph node biopsy now requested to evaluate for current status of lymphoma.   Has had some weight loss and night sweats. Denied any pain or SOB.    Past Medical History:  Diagnosis Date  . 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 Arthritis   . Asthma   . Basal cell carcinoma 01/28/2021   R upper arm, EDC 03/04/2021  . Brain tumor (benign) ([place])    benign pituitary neoplasm  . Chronic pain    right arm  . Depression   . Environmental and seasonal allergies   . Hypertension   . Pneumonia   . Sleep apnea    does not wear CPAP ; uses humidifier instead  . Squamous cell carcinoma of skin 02/19/2021   L inferior mandible, treated with [facility]     Past Surgical History:  Procedure Laterality Date  . ANTERIOR CERVICAL DECOMP/DISCECTOMY FUSION N/A 06/17/2016   Procedure: ANTERIOR CERVICAL DECOMPRESSION FUSION, CERVICAL 3-4, CERVICAL 4-5 WITH INSTRUMENTATION AND ALLOGRAFT;  Surgeon: Phylliss Bob, MD;  Location: [facility];  Service: Orthopedics;  Laterality: N/A;  ANTERIOR CERVICAL DECOMPRESSION FUSION, CERVICAL 3-4, CERVICAL 4-5 WITH INSTRUMENTATION AND ALLOGRAFT  . BACK SURGERY    . NECK SURGERY  2009    Allergies: Penicillins, Tetanus toxoids, Lisinopril, Losartan, Tetanus toxoid, Codeine, and Doxycycline  Medications: Prior to Admission medications   Medication Sig Start Date End Date Taking? Authorizing Provider  amLODipine (NORVASC) 5 MG tablet Take 1 tablet (5 mg total) by mouth daily. 02/19/21  Yes Birdie Sons, MD   aspirin EC 81 MG tablet Take 81 mg by mouth daily. Swallow whole.   Yes [provider]  atorvastatin (LIPITOR) 40 MG tablet Take 1 tablet (40 mg total) by mouth daily. 02/19/21  Yes Birdie Sons, MD  Budeson-Glycopyrrol-Formoterol (BREZTRI AEROSPHERE) 160-9-4.8 MCG/ACT AERO Inhale 2 puffs into the lungs 2 (two) times daily. 06/25/20  Yes Tyler Pita, MD  clonazePAM (KLONOPIN) 0.5 MG tablet Take 0.5 mg by mouth daily.   Yes Ander Slade, PA-C  DULoxetine (CYMBALTA) 60 MG capsule Take 1 capsule (60 mg total) by mouth daily. 02/19/21  Yes Birdie Sons, MD  EUCRISA 2 % OINT Apply  a small amount to affected area   qd/bid to aa's chest, inframammary, eye 08/30/19  Yes [provider]  famotidine (PEPCID) 20 MG tablet Take 1 tablet (20 mg total) by mouth 2 (two) times daily. 02/19/21  Yes Birdie Sons, MD  lansoprazole (PREVACID) 30 MG capsule TAKE 1 CAPSULE BY MOUTH ONCE DAILY AT NOON 10/16/20  Yes Tyler Pita, MD  loratadine (CLARITIN) 10 MG tablet Take 10 mg by mouth daily.  12/18/14  Yes [provider]  metoprolol succinate (TOPROL-XL) 25 MG 24 hr tablet Take 1 tablet (25 mg total) by mouth daily. 02/19/21  Yes Birdie Sons, MD  modafinil (PROVIGIL) 200 MG tablet Take 200 mg by mouth daily.   Yes [provider]  montelukast (SINGULAIR) 10 MG tablet Take 1 tablet (10 mg total) by mouth at bedtime.  02/19/21  Yes Birdie Sons, MD  naltrexone (DEPADE) 50 MG tablet  10/28/20  Yes [provider]  naproxen sodium (ANAPROX) 220 MG tablet Take 220 mg by mouth 2 (two) times daily with a meal.   Yes [provider]  OLANZapine (ZYPREXA) 7.5 MG tablet Take 7.5 mg by mouth at bedtime.   Yes [provider]  tadalafil (CIALIS) 20 MG tablet 1 tab 1 hour prior to intercourse 08/23/20  Yes Stoioff, Ronda Fairly, MD  testosterone cypionate (DEPOTESTOSTERONE CYPIONATE) 200 MG/ML injection Inject 1 mL (200 mg total) into the muscle  every 14 (fourteen) days. 02/22/21  Yes Stoioff, Ronda Fairly, MD  finasteride (PROSCAR) 5 MG tablet TAKE 1 TABLET BY MOUTH ONCE DAILY 09/12/20   Stoioff, Ronda Fairly, MD  fluticasone (FLONASE) 50 MCG/ACT nasal spray Place 1 spray into the nose daily.  01/06/14   [provider]  omeprazole (PRILOSEC) 20 MG capsule Take 1 capsule (20 mg total) by mouth 2 (two) times daily before a meal. 02/19/21   Birdie Sons, MD  tacrolimus (PROTOPIC) 0.1 % ointment Apply 1 application topically 2 (two) times daily. 09/11/19   [provider]  triamcinolone (KENALOG) 0.1 % Apply to affected areas 1-2 times a day until rash improved. Avoid face, groin, underarms. 01/28/21   Brendolyn Patty, MD     Family History  Problem Relation Age of Onset  . Diabetes Mother   . Diabetes Other     Social History   Socioeconomic History  . Marital status: Divorced    Spouse name: Not on file  . Number of children: Not on file  . Years of education: Not on file  . Highest education level: Not on file  Occupational History  . Not on file  Tobacco Use  . Smoking status: Current Every Day Smoker    Years: 20.00    Types: Cigars  . Smokeless tobacco: Never Used  . Tobacco comment: about 15 cigars/evening  Vaping Use  . Vaping Use: Former  . Start date: 04/30/2017  . Quit date: 11/30/2017  Substance and Sexual Activity  . Alcohol use: Not Currently    Alcohol/week: 0.0 standard drinks    Comment: 12 pack a beer a night  . Drug use: No  . Sexual activity: Not Currently  Other Topics Concern  . Not on file  Social History Narrative  . Not on file   Social Determinants of Health   Financial Resource Strain: Not on file  Food Insecurity: Not on file  Transportation Needs: Not on file  Physical Activity: Not on file  Stress: Not on file  Social Connections: Not on file    ECOG Status: 1 - Symptomatic but completely ambulatory  Review of Systems: A 12 point ROS discussed and pertinent positives are  indicated in the HPI above.  All other systems are negative.  Review of Systems  Constitutional: Positive for unexpected weight change.       Positive for night sweats  Respiratory: Negative.   Cardiovascular: Negative.   Gastrointestinal: Negative.   Genitourinary: Negative.   Musculoskeletal: Negative.   Neurological: Negative.     Vital Signs: BP 131/90   Pulse (!) 107   Temp 98.3 F (36.8 C) (Oral)   Resp 16   Ht $R'6\' 2"'bY$  (1.88 m)   Wt 106.6 kg   SpO2 100%   BMI 30.17 kg/m   Physical Exam Vitals reviewed.  Constitutional:      General: He is not in  acute distress.    Appearance: He is not ill-appearing, toxic-appearing or diaphoretic.  Cardiovascular:     Rate and Rhythm: Normal rate and regular rhythm.     Heart sounds: Normal heart sounds. No murmur heard. No friction rub. No gallop.   Pulmonary:     Effort: Pulmonary effort is normal. No respiratory distress.     Breath sounds: Normal breath sounds. No stridor. No wheezing, rhonchi or rales.  Abdominal:     General: There is no distension.     Palpations: Abdomen is soft.     Tenderness: There is no abdominal tenderness. There is no guarding or rebound.  Musculoskeletal:        General: No swelling.  Skin:    General: Skin is warm and dry.  Neurological:     General: No focal deficit present.     Mental Status: He is alert and oriented to person, place, and time.     Imaging: CT CHEST ABDOMEN PELVIS W CONTRAST  Result Date: 02/26/2021 CLINICAL DATA:  Follicular lymphoma.  Restaging exam. EXAM: CT CHEST, ABDOMEN, AND PELVIS WITH CONTRAST TECHNIQUE: Multidetector CT imaging of the chest, abdomen and pelvis was performed following the standard protocol during bolus administration of intravenous contrast. CONTRAST:  152mL OMNIPAQUE IOHEXOL 300 MG/ML  SOLN COMPARISON:  CT 08/14/2020, 07/11/2020 FINDINGS: CT CHEST FINDINGS Cardiovascular: Normal heart size. Coronary artery calcification and aortic atherosclerotic  calcification. Mediastinum/Nodes: Prominent axial lymph nodes again noted. RIGHT axial lymph node measures 20 mm (image 24/series 2) compared to 17 mm. LEFT axillary lymph node on image 25 measures 18 mm compared to 13 mm. Multiple mediastinal lymph nodes which are minimally enlarged. RIGHT lower paratracheal node measures 11 mm compared to 12 mm. Lungs/Pleura: No suspicious nodularity. No pleural thickening. Airways normal. Musculoskeletal: No aggressive osseous lesion. CT ABDOMEN AND PELVIS FINDINGS Hepatobiliary: No focal hepatic lesion. No biliary ductal dilatation. Gallbladder is normal. Common bile duct is normal. Pancreas: Pancreas is normal. No ductal dilatation. No pancreatic inflammation. Spleen: Spleen is enlarged measuring a 20.4 cm in craniocaudad dimension compared to 14.5 cm on CT 08/14/2020 Adrenals/urinary tract: Adrenal glands and kidneys are normal. The ureters and bladder normal. Stomach/Bowel: Stomach, small bowel, appendix, and cecum are normal. The colon and rectosigmoid colon are normal. Vascular/Lymphatic: Mild intimal calcification abdominal aorta normal caliber. Periaortic retroperitoneal adenopathy again noted. Example lymph node position between the IVC and aorta measuring 31 mm (image 88/series 2) increased from 22 mm Iliac pelvic adenopathy again noted. RIGHT external iliac lymph node measures 25 mm (image 125) compared to 23 mm. LEFT common iliac lymph node measures 21 mm increased from 18 mm. Inguinal lymph nodes also increased in size. Reproductive: Unremarkable Other: No free fluid. Musculoskeletal: No aggressive osseous lesion. IMPRESSION: Chest Impression: 1. Mild increase in bilateral axial lymphadenopathy. 2. No significant change in multiple mediastinal lymph nodes. 3. No pulmonary nodules. Abdomen / Pelvis Impression: 1. Interval increase in size of retroperitoneal and pelvic lymph nodes. 2. Interval increase in inguinal adenopathy. 3. Progressive splenomegaly. Electronically  Signed   By: Suzy Bouchard M.D.   On: 02/26/2021 11:10   NM PET Image Initial (PI) Skull Base To Thigh  Result Date: 03/12/2021 CLINICAL DATA:  Initial treatment strategy for follicular lymphoma. EXAM: NUCLEAR MEDICINE PET SKULL BASE TO THIGH TECHNIQUE: 12.83 mCi F-18 FDG was injected intravenously. Full-ring PET imaging was performed from the skull base to thigh after the radiotracer. CT data was obtained and used for attenuation correction and anatomic localization. Fasting  blood glucose: 122 mg/dl COMPARISON:  CT chest, abdomen and pelvis 02/25/2021 and abdominopelvic CT 08/14/2020 FINDINGS: Mediastinal blood pool activity: SUV max 1.8 Liver activity: SUV max 3.4 NECK: There are multiple mildly enlarged hypermetabolic cervical lymph nodes bilaterally within stations 2 through 4. Representative nodes include level 2 nodes within SUV max of 7.2 on the right and 5.5 on the left.There is no suspicious activity within the pharyngeal mucosal space. Activity anteriorly in the oral cavity at the tip of the tongue is likely physiologic. Incidental CT findings: Bilateral carotid atherosclerosis. CHEST: There are multiple hypermetabolic mediastinal, hilar and axillary lymph nodes bilaterally. Representative nodes include a 1.2 cm right paratracheal node on image 88/3 (SUV max 6.2), a 1.9 cm right axillary node (SUV max 6.8) and a 1.6 cm left axillary node (SUV max 7.3). No hypermetabolic pulmonary activity or suspicious pulmonary nodularity. Incidental CT findings: Atherosclerosis of the aorta, great vessels and coronary arteries. ABDOMEN/PELVIS: The spleen is moderately enlarged with hypermetabolic activity (SUV max 6.0). No focal hypermetabolic activity is present within the liver, adrenal glands or pancreas. There are multiple enlarged and hypermetabolic abdominopelvic lymph nodes. Representative nodes include a 2.2 cm aortocaval node on image 205/3 (SUV max 8.9), external iliac nodes on image 257/3 measuring 2.2  cm on the right (SUV max 10.3) and 2.5 cm on the left (SUV max 10.9), and multiple hypermetabolic inguinal lymph nodes bilaterally (up to 8.0 cm on the left). Incidental CT findings: The hepatic density is diffusely decreased, consistent with steatosis. Mild aortic and branch vessel atherosclerosis. Mild sigmoid diverticulosis. SKELETON: Low level metabolic activity is seen throughout the bones, nonspecific. No focal lesions are identified. Incidental CT findings: none IMPRESSION: 1. Multiple hypermetabolic cervical, thoracic, abdominal and pelvic lymph nodes as described consistent with lymphoma ([place] 5). 2. Moderate splenomegaly with associated moderate hypermetabolic activity. 3. Nonspecific mildly increased metabolic activity throughout the bones without focal lesions. No other solid organ parenchymal involvement identified. Electronically Signed   By: Richardean Sale M.D.   On: 03/12/2021 08:22   CT BONE MARROW BIOPSY & ASPIRATION  Result Date: 03/03/2021 INDICATION: 60 year old male with a history of follicular lymphoma grade 1, referred for biopsy EXAM: CT BONE MARROW BIOPSY AND ASPIRATION MEDICATIONS: None. ANESTHESIA/SEDATION: Moderate (conscious) sedation was employed during this procedure. A total of Versed 2.0 mg and Fentanyl 100 mcg was administered intravenously. Moderate Sedation Time: 12 minutes. The patient's level of consciousness and vital signs were monitored continuously by radiology nursing throughout the procedure under my direct supervision. FLUOROSCOPY TIME:  CT COMPLICATIONS: None PROCEDURE: The procedure risks, benefits, and alternatives were explained to the patient. Questions regarding the procedure were encouraged and answered. The patient understands and consents to the procedure. Scout CT of the pelvis was performed for surgical planning purposes. The posterior pelvis was prepped with Chlorhexidine in a sterile fashion, and a sterile drape was applied covering the operative  field. A sterile gown and sterile gloves were used for the procedure. Local anesthesia was provided with 1% Lidocaine. Posterior right iliac bone was targeted for biopsy. The skin and subcutaneous tissues were infiltrated with 1% lidocaine without epinephrine. A small stab incision was made with an 11 blade scalpel, and an 11 gauge Murphy needle was advanced with CT guidance to the posterior cortex. Manual forced was used to advance the needle through the posterior cortex and the stylet was removed. A bone marrow aspirate was retrieved and passed to a cytotechnologist in the room. The Murphy needle was then advanced without the  stylet for a core biopsy. The core biopsy was retrieved and also passed to a cytotechnologist. Manual pressure was used for hemostasis and a sterile dressing was placed. No complications were encountered no significant blood loss was encountered. Patient tolerated the procedure well and remained hemodynamically stable throughout. IMPRESSION: Status post CT-guided bone marrow biopsy, with tissue specimen sent to pathology for complete histopathologic analysis Signed, Dulcy Fanny. Earleen Newport, DO [facility] Electronically Signed   By: Corrie Mckusick D.O.   On: 03/03/2021 10:52    Labs:  CBC: Recent Labs    09/02/20 1350 12/10/20 1403 02/17/21 1406 02/27/21 1351 03/03/21 0842  WBC 7.5 8.8  --  7.3 8.6  HGB 17.7* 15.7  --  14.7 14.8  HCT 49.4 44.9 42.8 41.4 42.7  PLT 141* 125*  --  91* 119*    COAGS: Recent Labs    07/25/20 0937  INR 1.0    BMP: Recent Labs    07/25/20 0937 12/10/20 1403 02/25/21 1451 02/27/21 1351  NA 135 138  --  136  K 4.2 3.8  --  4.3  CL 97* 101  --  98  CO2 24 27  --  24  GLUCOSE 103* 113*  --  112*  BUN 15 16  --  12  CALCIUM 9.3 8.6*  --  8.9  CREATININE 1.12 0.96 0.90 0.91  GFRNONAA >60 >60  --  >60  GFRAA >60  --   --   --     LIVER FUNCTION TESTS: Recent Labs     07/25/20 0937 12/10/20 1403 02/27/21 1351  BILITOT 1.5* 0.5 0.8  AST 41 27 28  ALT $Re'30 26 22  'pHE$ ALKPHOS 100 119 140*  PROT 7.8 6.9 6.9  ALBUMIN 4.3 3.9 4.0    Assessment and Plan:  For US guided inguinal lymph node biopsy today. Will assess for largest/most accessible node(s) by [place]. Risks and benefits of lymph node biopsy was discussed with the patient and/or patient's family including, but not limited to bleeding, infection, damage to adjacent structures or low yield requiring additional tests. All of the questions were answered and there is agreement to proceed. Consent signed and in chart.    Electronically Signed: Azzie Roup, MD 03/19/2021, 10:09 AM     I spent a total of 10 Minutes in face to face in clinical consultation, greater than 50% of which was counseling/coordinating care for lymph node biopsy.

## 2021-03-19 NOTE — Progress Notes (Signed)
Patient clinically stable post LN inguinal biopsy per Dr Kathlene Cote, tolerated well. Received Versed 2 mg along with Fentanyl 100 mcg IV for procedure. Report givent to [facility] in specials post procedure.denies complaints at this time.

## 2021-03-19 NOTE — Procedures (Signed)
Interventional Radiology Procedure Note  Procedure: US Guided Biopsy of right inguinal lymph node  Complications: None  Estimated Blood Loss: < 10 mL  Findings: 12 G core biopsy of right inguinal lymph node performed under US guidance.  Five core samples obtained and sent to Pathology.  Venetia Night. Kathlene Cote, M.D Pager:  [phone]

## 2021-03-20 ENCOUNTER — Encounter: Payer: Self-pay | Admitting: Oncology

## 2021-03-21 LAB — SURGICAL PATHOLOGY

## 2021-03-24 ENCOUNTER — Encounter: Payer: Self-pay | Admitting: Oncology

## 2021-03-26 ENCOUNTER — Other Ambulatory Visit: Payer: Self-pay | Admitting: Adult Health

## 2021-03-26 ENCOUNTER — Telehealth: Payer: Self-pay | Admitting: Adult Health

## 2021-03-26 DIAGNOSIS — D849 Immunodeficiency, unspecified: Secondary | ICD-10-CM

## 2021-03-26 NOTE — Telephone Encounter (Signed)
I connected by phone with [name] on 03/26/2021, 10:35 AM to discuss the potential use of a new treatment, tixagevimab/cilgavimab, for pre-exposure prophylaxis for prevention of coronavirus disease 2019 (COVID-19) caused by the SARS-CoV-2 virus.  This patient is a 60 y.o. male that meets the FDA criteria for Emergency Use Authorization of tixagevimab/cilgavimab for pre-exposure prophylaxis of COVID-19 disease. Pt meets following criteria:  Age >12 yr and weight > 40kg  Not currently infected with SARS-CoV-2 and has no known recent exposure to an individual infected with SARS-CoV-2 AND o Who has moderate to severe immune compromise due to a medical condition or receipt of immunosuppressive medications or treatments and may not mount an adequate immune response to COVID-19 vaccination or  o Vaccination with any available COVID-19 vaccine, according to the approved or authorized schedule, is not recommended due to a history of severe adverse reaction (e.g., severe allergic reaction) to a COVID-19 vaccine(s) and/or COVID-19 vaccine component(s).  o Patient meets the following definition of mod-severe immune compromised status: 2. Received B-cell depleting therapies within the last 6 months and age < 40yrs  I have spoken and communicated the following to the patient or parent/caregiver regarding COVID monoclonal antibody treatment:  1. FDA has authorized the emergency use of tixagevimab/cilgavimab for the pre-exposure prophylaxis of COVID-19 in patients with moderate-severe immunocompromised status, who meet above EUA criteria.  2. The significant known and potential risks and benefits of COVID monoclonal antibody, and the extent to which such potential risks and benefits are unknown.  3. Information on available alternative treatments and the risks and benefits of those alternatives, including clinical trials.  4. The patient or parent/caregiver has the option to accept or refuse COVID monoclonal  antibody treatment.  After reviewing this information with the patient, agree to receive tixagevimab/cilgavimab  Scot Dock, NP, 03/26/2021, 10:35 AM

## 2021-03-27 ENCOUNTER — Inpatient Hospital Stay: Payer: Medicare HMO

## 2021-03-27 ENCOUNTER — Inpatient Hospital Stay: Payer: Medicare HMO | Admitting: Oncology

## 2021-03-27 ENCOUNTER — Encounter: Payer: Self-pay | Admitting: Oncology

## 2021-03-27 ENCOUNTER — Other Ambulatory Visit: Payer: Self-pay

## 2021-03-27 ENCOUNTER — Telehealth: Payer: Self-pay

## 2021-03-27 ENCOUNTER — Telehealth: Payer: Self-pay | Admitting: *Deleted

## 2021-03-27 ENCOUNTER — Encounter: Payer: Self-pay | Admitting: *Deleted

## 2021-03-27 VITALS — BP 134/88 | HR 112 | Temp 97.7°F | Resp 20 | Wt 240.4 lb

## 2021-03-27 DIAGNOSIS — E291 Testicular hypofunction: Secondary | ICD-10-CM | POA: Diagnosis not present

## 2021-03-27 DIAGNOSIS — Z298 Encounter for other specified prophylactic measures: Secondary | ICD-10-CM | POA: Diagnosis not present

## 2021-03-27 DIAGNOSIS — C8208 Follicular lymphoma grade I, lymph nodes of multiple sites: Secondary | ICD-10-CM | POA: Diagnosis not present

## 2021-03-27 DIAGNOSIS — Z79899 Other long term (current) drug therapy: Secondary | ICD-10-CM | POA: Diagnosis not present

## 2021-03-27 DIAGNOSIS — R768 Other specified abnormal immunological findings in serum: Secondary | ICD-10-CM

## 2021-03-27 DIAGNOSIS — C8202 Follicular lymphoma grade I, intrathoracic lymph nodes: Secondary | ICD-10-CM

## 2021-03-27 DIAGNOSIS — D849 Immunodeficiency, unspecified: Secondary | ICD-10-CM

## 2021-03-27 DIAGNOSIS — B1910 Unspecified viral hepatitis B without hepatic coma: Secondary | ICD-10-CM | POA: Diagnosis not present

## 2021-03-27 DIAGNOSIS — E785 Hyperlipidemia, unspecified: Secondary | ICD-10-CM | POA: Diagnosis not present

## 2021-03-27 DIAGNOSIS — F17210 Nicotine dependence, cigarettes, uncomplicated: Secondary | ICD-10-CM | POA: Diagnosis not present

## 2021-03-27 MED ORDER — CILGAVIMAB (PART OF EVUSHELD) INJECTION
300.0000 mg | Freq: Once | INTRAMUSCULAR | Status: AC
  Administered 2021-03-27: 300 mg via INTRAMUSCULAR

## 2021-03-27 MED ORDER — TIXAGEVIMAB (PART OF EVUSHELD) INJECTION
300.0000 mg | Freq: Once | INTRAMUSCULAR | Status: AC
  Administered 2021-03-27: 300 mg via INTRAMUSCULAR

## 2021-03-27 MED ORDER — ACYCLOVIR 400 MG PO TABS: 400.0000 mg | ORAL_TABLET | Freq: Two times a day (BID) | ORAL | 2 refills | Status: DC

## 2021-03-27 NOTE — Telephone Encounter (Signed)
Sites frozen can take 4 weeks to clear, so may resolve by next week. Can send Mupirocin for wound and Doxycycline 100 mg 1 po bid #14 0rf to treat for possible infection.  (make sure pt has no allergy to Doxycycline)

## 2021-03-27 NOTE — Progress Notes (Signed)
HR 112. Okay to proceed with Evusheld injection per Lanetta Inch., RN per Dr. Janese Banks.   Patient monitored x 60 minutes post Evusheld injection. Patient tolerated well and fact sheet provided. Discharged home.

## 2021-03-27 NOTE — Telephone Encounter (Signed)
Patient called regarding areas treated by Dr. Nicole Kindred. The place that was frozen has now scabbed over with no improvement.  Also patient is very concerned with EDC site. Patient states it is very sore to touch with a red ring around it. Patient is willing to come in next week to have this checked by Dr. Nicole Kindred but would like to know if we can send in antibiotic for the weekend.   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

## 2021-03-27 NOTE — Telephone Encounter (Signed)
Patient daughter Ria Comment called asking that Dr Janese Banks call her back to discuss some health issues that she does think the patient is telling Dr Janese Banks

## 2021-03-28 ENCOUNTER — Telehealth (INDEPENDENT_AMBULATORY_CARE_PROVIDER_SITE_OTHER): Payer: Self-pay

## 2021-03-28 MED ORDER — CEFDINIR 300 MG PO CAPS: 300.0000 mg | ORAL_CAPSULE | Freq: Two times a day (BID) | ORAL | 0 refills | Status: AC

## 2021-03-28 MED ORDER — MUPIROCIN 2 % EX OINT: 1.0000 "application " | TOPICAL_OINTMENT | Freq: Two times a day (BID) | CUTANEOUS | 0 refills | Status: DC

## 2021-03-28 NOTE — Addendum Note (Signed)
Addended by: Johnsie Kindred R on: 03/28/2021 09:07 AM   Modules accepted: Orders

## 2021-03-28 NOTE — Telephone Encounter (Signed)
Spoke with the patient and he is scheduled for a port placement on 04/03/21 with Dr. Lucky Cowboy with a 9:30 am arrival time to the [facility]. Covid testing on 04/01/21 between 8-2 pm at the [facility]. Pre-procedure instructions were discussed and will be mailed.

## 2021-03-28 NOTE — Telephone Encounter (Signed)
Patient advised of medications to be sent in.   Patient declines having reactions to [facility] in past.

## 2021-03-28 NOTE — Telephone Encounter (Signed)
Called patient and left message to return my call.   Patient shows he has a allergy to doxcycline (rash in 2020).   Per Dr. Nicole Kindred send in [place] 300mg  BID for 7 days.

## 2021-03-31 ENCOUNTER — Ambulatory Visit: Payer: Medicare HMO | Admitting: Gastroenterology

## 2021-03-31 ENCOUNTER — Other Ambulatory Visit: Payer: Self-pay

## 2021-03-31 ENCOUNTER — Telehealth: Payer: Self-pay | Admitting: Gastroenterology

## 2021-03-31 ENCOUNTER — Encounter: Payer: Self-pay | Admitting: Gastroenterology

## 2021-03-31 VITALS — BP 133/79 | HR 120 | Ht 74.0 in | Wt 236.8 lb

## 2021-03-31 DIAGNOSIS — R768 Other specified abnormal immunological findings in serum: Secondary | ICD-10-CM | POA: Diagnosis not present

## 2021-03-31 DIAGNOSIS — Z79899 Other long term (current) drug therapy: Secondary | ICD-10-CM | POA: Diagnosis not present

## 2021-03-31 DIAGNOSIS — Z7960 Long term (current) use of unspecified immunomodulators and immunosuppressants: Secondary | ICD-10-CM

## 2021-03-31 MED ORDER — TENOFOVIR DISOPROXIL FUMARATE 300 MG PO TABS: 300.0000 mg | ORAL_TABLET | Freq: Every day | ORAL | 2 refills | Status: AC

## 2021-03-31 NOTE — Telephone Encounter (Signed)
I will call her today

## 2021-03-31 NOTE — Telephone Encounter (Signed)
Dr. Janese Banks. Acoording to [place] she got a phone call from pt's daughter and told her that pt is a drug addict and when Jerene Pitch tried to find him , she did not know her dad's DOB. Then when brooke asked her for her name she said that you don't need it. On the list of people to talk to the patient only has his dad as a person to speak to. There is no one else on his account

## 2021-03-31 NOTE — H&P (View-Only) (Signed)
[name] MD, MRCP(U.K) [address]  [place], [place] [address]  Main: [phone]  Fax: [phone]   Gastroenterology Consultation  Referring Provider:     Dr Janese Banks  Primary Care Physician:  Jerrol Banana., MD Primary Gastroenterologist:  Dr. [name]  Reason for Consultation:    Hepatitis B core ab positive     HPI:   Russell Simpson is a 60 y.o. y/o male referred for a positive hepatitis B core antibody test in the setting of treatment for the lymphoma requiring Rituxan.  He has been seen and referred by Dr. Janese Banks performed testing prior to commencing on treatment with Rituxan for lymphoma.  On 03/10/2021 was noted to have positive hepatitis B core antibody with surface antigen negative and surface antibody strongly positive.CT abdomen on 02/26/2021 showed no evidence of portal hypertension   02/27/2021 platelet count is 91  He denies any prior tattoos, military service, blood transfusions, illegal drug use.  He does collect receiving vaccination while at school from the vaccination gun.   Past Medical History:  Diagnosis Date  . 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 Arthritis   . Asthma   . Basal cell carcinoma 01/28/2021   R upper arm, [facility] 03/04/2021  . 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 Chronic pain    right arm  . Depression   . Environmental and seasonal allergies   . Hypertension   . Pneumonia   . Sleep apnea    does not wear CPAP ; uses humidifier instead  . Squamous cell carcinoma of skin 02/19/2021   L inferior mandible, treated with [facility]     Past Surgical History:  Procedure Laterality Date  . ANTERIOR CERVICAL DECOMP/DISCECTOMY FUSION N/A 06/17/2016   Procedure: ANTERIOR CERVICAL DECOMPRESSION FUSION, CERVICAL 3-4, CERVICAL 4-5 WITH INSTRUMENTATION AND ALLOGRAFT;  Surgeon: Phylliss Bob, MD;  Location: [facility];  Service: Orthopedics;  Laterality: N/A;  ANTERIOR CERVICAL DECOMPRESSION FUSION, CERVICAL 3-4,  CERVICAL 4-5 WITH INSTRUMENTATION AND ALLOGRAFT  . BACK SURGERY    . NECK SURGERY  2009    Prior to Admission medications   Medication Sig Start Date End Date Taking? Authorizing Provider  acyclovir (ZOVIRAX) 400 MG tablet Take 1 tablet (400 mg total) by mouth 2 (two) times daily. 03/27/21   Sindy Guadeloupe, MD  amLODipine (NORVASC) 5 MG tablet Take 1 tablet (5 mg total) by mouth daily. 02/19/21   Birdie Sons, MD  aspirin EC 81 MG tablet Take 81 mg by mouth daily. Swallow whole.    [provider]  atorvastatin (LIPITOR) 40 MG tablet Take 1 tablet (40 mg total) by mouth daily. 02/19/21   Birdie Sons, MD  Budeson-Glycopyrrol-Formoterol (BREZTRI AEROSPHERE) 160-9-4.8 MCG/ACT AERO Inhale 2 puffs into the lungs 2 (two) times daily. Patient not taking: Reported on 03/27/2021 06/25/20   Tyler Pita, MD  cefdinir (OMNICEF) 300 MG capsule Take 1 capsule (300 mg total) by mouth 2 (two) times daily for 7 days. 03/28/21 04/04/21  Brendolyn Patty, MD  clonazePAM (KLONOPIN) 0.5 MG tablet Take 0.5 mg by mouth daily.    Ander Slade, PA-C  DULoxetine (CYMBALTA) 60 MG capsule Take 1 capsule (60 mg total) by mouth daily. 02/19/21   Birdie Sons, MD  EUCRISA 2 % OINT Apply  a small amount to affected area   qd/bid to aa's chest, inframammary, eye 08/30/19   [provider]  famotidine (PEPCID) 20 MG tablet  Take 1 tablet (20 mg total) by mouth 2 (two) times daily. 02/19/21   Birdie Sons, MD  finasteride (PROSCAR) 5 MG tablet TAKE 1 TABLET BY MOUTH ONCE DAILY 09/12/20   Stoioff, Ronda Fairly, MD  fluticasone (FLONASE) 50 MCG/ACT nasal spray Place 1 spray into the nose daily.  Patient not taking: Reported on 03/27/2021 01/06/14   [provider]  lansoprazole (PREVACID) 30 MG capsule TAKE 1 CAPSULE BY MOUTH ONCE DAILY AT NOON 10/16/20   Tyler Pita, MD  loratadine (CLARITIN) 10 MG tablet Take 10 mg by mouth daily.  12/18/14   [provider]  metoprolol succinate  (TOPROL-XL) 25 MG 24 hr tablet Take 1 tablet (25 mg total) by mouth daily. 02/19/21   Birdie Sons, MD  modafinil (PROVIGIL) 200 MG tablet Take 200 mg by mouth daily.    [provider]  montelukast (SINGULAIR) 10 MG tablet Take 1 tablet (10 mg total) by mouth at bedtime. 02/19/21   Birdie Sons, MD  mupirocin ointment (BACTROBAN) 2 % Apply 1 application topically 2 (two) times daily. 03/28/21   Brendolyn Patty, MD  naltrexone (DEPADE) 50 MG tablet  10/28/20   [provider]  naproxen sodium (ANAPROX) 220 MG tablet Take 220 mg by mouth 2 (two) times daily with a meal.    [provider]  OLANZapine (ZYPREXA) 7.5 MG tablet Take 7.5 mg by mouth at bedtime.    [provider]  omeprazole (PRILOSEC) 20 MG capsule Take 1 capsule (20 mg total) by mouth 2 (two) times daily before a meal. 02/19/21   Birdie Sons, MD  tacrolimus (PROTOPIC) 0.1 % ointment Apply 1 application topically 2 (two) times daily. 09/11/19   [provider]  tadalafil (CIALIS) 20 MG tablet 1 tab 1 hour prior to intercourse 08/23/20   Stoioff, Ronda Fairly, MD  testosterone cypionate (DEPOTESTOSTERONE CYPIONATE) 200 MG/ML injection Inject 1 mL (200 mg total) into the muscle every 14 (fourteen) days. 02/22/21   Stoioff, Ronda Fairly, MD  triamcinolone (KENALOG) 0.1 % Apply to affected areas 1-2 times a day until rash improved. Avoid face, groin, underarms. 01/28/21   Brendolyn Patty, MD    Family History  Problem Relation Age of Onset  . Diabetes Mother   . Diabetes Other      Social History   Tobacco Use  . Smoking status: Current Every Day Smoker    Years: 20.00    Types: Cigars  . Smokeless tobacco: Never Used  . Tobacco comment: about 15 cigars/evening  Vaping Use  . Vaping Use: Former  . Start date: 04/30/2017  . Quit date: 11/30/2017  Substance Use Topics  . Alcohol use: Not Currently    Alcohol/week: 0.0 standard drinks    Comment: 12 pack a beer a night  . Drug use: No     Allergies as of 03/31/2021 - Review Complete 03/31/2021  Allergen Reaction Noted  . Penicillins Anaphylaxis 09/03/2015  . Tetanus toxoids Swelling 09/03/2015  . Lisinopril Cough 10/15/2015  . Losartan  10/15/2015  . Tetanus toxoid    . Codeine Nausea Only and Nausea And Vomiting 12/05/2020  . Doxycycline Rash 01/07/2019    Review of Systems:    All systems reviewed and negative except where noted in HPI.   Physical Exam:  BP 133/79 (BP Location: Left Arm, Patient Position: Sitting, Cuff Size: Large)   Pulse (!) 120   Ht $R'6\' 2"'sZ$  (1.88 m)   Wt 236 lb 12.8 oz (107.4 kg)  BMI 30.40 kg/m  No LMP for male patient. Psych:  Alert and cooperative. Normal mood and affect. General:   Alert,  Well-developed, well-nourished, pleasant and cooperative in NAD Head:  Normocephalic and atraumatic. Eyes:  Sclera clear, no icterus.   Conjunctiva pink. Ears:  Normal auditory acuity. Neurologic:  Alert and oriented x3;  grossly normal neurologically. Psych:  Alert and cooperative. Normal mood and affect.  Imaging Studies: NM PET Image Initial (PI) Skull Base To Thigh  Result Date: 03/12/2021 CLINICAL DATA:  Initial treatment strategy for follicular lymphoma. EXAM: NUCLEAR MEDICINE PET SKULL BASE TO THIGH TECHNIQUE: 12.83 mCi F-18 FDG was injected intravenously. Full-ring PET imaging was performed from the skull base to thigh after the radiotracer. CT data was obtained and used for attenuation correction and anatomic localization. Fasting blood glucose: 122 mg/dl COMPARISON:  CT chest, abdomen and pelvis 02/25/2021 and abdominopelvic CT 08/14/2020 FINDINGS: Mediastinal blood pool activity: SUV max 1.8 Liver activity: SUV max 3.4 NECK: There are multiple mildly enlarged hypermetabolic cervical lymph nodes bilaterally within stations 2 through 4. Representative nodes include level 2 nodes within SUV max of 7.2 on the right and 5.5 on the left.There is no suspicious activity within the pharyngeal mucosal  space. Activity anteriorly in the oral cavity at the tip of the tongue is likely physiologic. Incidental CT findings: Bilateral carotid atherosclerosis. CHEST: There are multiple hypermetabolic mediastinal, hilar and axillary lymph nodes bilaterally. Representative nodes include a 1.2 cm right paratracheal node on image 88/3 (SUV max 6.2), a 1.9 cm right axillary node (SUV max 6.8) and a 1.6 cm left axillary node (SUV max 7.3). No hypermetabolic pulmonary activity or suspicious pulmonary nodularity. Incidental CT findings: Atherosclerosis of the aorta, great vessels and coronary arteries. ABDOMEN/PELVIS: The spleen is moderately enlarged with hypermetabolic activity (SUV max 6.0). No focal hypermetabolic activity is present within the liver, adrenal glands or pancreas. There are multiple enlarged and hypermetabolic abdominopelvic lymph nodes. Representative nodes include a 2.2 cm aortocaval node on image 205/3 (SUV max 8.9), external iliac nodes on image 257/3 measuring 2.2 cm on the right (SUV max 10.3) and 2.5 cm on the left (SUV max 10.9), and multiple hypermetabolic inguinal lymph nodes bilaterally (up to 8.0 cm on the left). Incidental CT findings: The hepatic density is diffusely decreased, consistent with steatosis. Mild aortic and branch vessel atherosclerosis. Mild sigmoid diverticulosis. SKELETON: Low level metabolic activity is seen throughout the bones, nonspecific. No focal lesions are identified. Incidental CT findings: none IMPRESSION: 1. Multiple hypermetabolic cervical, thoracic, abdominal and pelvic lymph nodes as described consistent with lymphoma ([place] 5). 2. Moderate splenomegaly with associated moderate hypermetabolic activity. 3. Nonspecific mildly increased metabolic activity throughout the bones without focal lesions. No other solid organ parenchymal involvement identified. Electronically Signed   By: Richardean Sale M.D.   On: 03/12/2021 08:22   CT BONE MARROW BIOPSY &  ASPIRATION  Result Date: 03/03/2021 INDICATION: 60 year old male with a history of follicular lymphoma grade 1, referred for biopsy EXAM: CT BONE MARROW BIOPSY AND ASPIRATION MEDICATIONS: None. ANESTHESIA/SEDATION: Moderate (conscious) sedation was employed during this procedure. A total of Versed 2.0 mg and Fentanyl 100 mcg was administered intravenously. Moderate Sedation Time: 12 minutes. The patient's level of consciousness and vital signs were monitored continuously by radiology nursing throughout the procedure under my direct supervision. FLUOROSCOPY TIME:  CT COMPLICATIONS: None PROCEDURE: The procedure risks, benefits, and alternatives were explained to the patient. Questions regarding the procedure were encouraged and answered. The patient understands and consents to  the procedure. Scout CT of the pelvis was performed for surgical planning purposes. The posterior pelvis was prepped with Chlorhexidine in a sterile fashion, and a sterile drape was applied covering the operative field. A sterile gown and sterile gloves were used for the procedure. Local anesthesia was provided with 1% Lidocaine. Posterior right iliac bone was targeted for biopsy. The skin and subcutaneous tissues were infiltrated with 1% lidocaine without epinephrine. A small stab incision was made with an 11 blade scalpel, and an 11 gauge Murphy needle was advanced with CT guidance to the posterior cortex. Manual forced was used to advance the needle through the posterior cortex and the stylet was removed. A bone marrow aspirate was retrieved and passed to a cytotechnologist in the room. The Murphy needle was then advanced without the stylet for a core biopsy. The core biopsy was retrieved and also passed to a cytotechnologist. Manual pressure was used for hemostasis and a sterile dressing was placed. No complications were encountered no significant blood loss was encountered. Patient tolerated the procedure well and remained hemodynamically  stable throughout. IMPRESSION: Status post CT-guided bone marrow biopsy, with tissue specimen sent to pathology for complete histopathologic analysis Signed, Dulcy Fanny. Earleen Newport, DO [facility] Radiology Electronically Signed   By: Corrie Mckusick D.O.   On: 03/03/2021 10:52   [place] CORE BIOPSY (LYMPH NODES)  Result Date: 03/19/2021 INDICATION: History of grade 1 follicular lymphoma with prior right axillary lymph node biopsy on 07/25/2020. Imaging evidence by CT and PET scan of probable progression of lymphoma and need for additional lymph node biopsy to assess status of disease. EXAM: ULTRASOUND GUIDED CORE BIOPSY OF RIGHT INGUINAL LYMPH NODE MEDICATIONS: None. ANESTHESIA/SEDATION: Fentanyl 100 mcg IV; Versed 2.0 mg IV Moderate Sedation Time:  20 minutes. The patient was continuously monitored during the procedure by the interventional radiology nurse under my direct supervision. PROCEDURE: The procedure, risks, benefits, and alternatives were explained to the patient. Questions regarding the procedure were encouraged and answered. The patient understands and consents to the procedure. A time-out was performed prior to initiating the procedure. Bilateral inguinal regions were initially scanned by ultrasound to localize lymphadenopathy. The right groin was prepped with chlorhexidine in a sterile fashion, and a sterile drape was applied covering the operative field. A sterile gown and sterile gloves were used for the procedure. Local anesthesia was provided with 1% Lidocaine. Under ultrasound guidance, 16 gauge core biopsy samples were obtained at the level of an enlarged inferior right inguinal lymph node. Core biopsy samples were submitted on saline soaked Telfa gauze to Pathology. COMPLICATIONS: None immediate. FINDINGS: Multiple enlarged bilateral inguinal lymph nodes are identified. The most accessible in the right groin was a superficial lymph node located in the  inferior inguinal regions/upper medial thigh. This lymph node measures approximately 3.8 x 1.4 x 2.6 cm. Solid core biopsy samples were obtained from different areas of the cortex of the lymph node. IMPRESSION: Ultrasound-guided core biopsy performed of an enlarged right inguinal lymph node. Electronically Signed   By: Aletta Edouard M.D.   On: 03/19/2021 11:54    Assessment and Plan:   Russell Simpson is a 60 y.o. y/o male has been referred for positive hepatitis B core antibody with a negative surface antigen in the setting of treatment of a lymphoma with Rituxan.  He would be at moderate risk for hepatitis B reactivation as he has a surface antigen that is negative He has normal renal functions  Plan 1.  Check  hepatitis C antibody to rule out infection, hep and HIV antibody 2.  Commence on tenofovir for for  at least 12 months after stoppinganti-CD20 agentssincethere is a lag in the recovery of B cell function among such patients.    Follow up in 8 to 12 weeks  Dr [name] MD,MRCP(U.K)

## 2021-03-31 NOTE — Progress Notes (Signed)
Russell Bellows MD, MRCP(U.K) [address]  [place]  [name], [name]  Main: [phone]  Fax: [phone]   Gastroenterology Consultation  Referring Provider:     Dr Russell Simpson  Primary Care Physician:  Russell Banana., MD Primary Gastroenterologist:  Dr. Jonathon Simpson  Reason for Consultation:    Hepatitis B core ab positive     HPI:   Russell Simpson is a 60 y.o. y/o male referred for a positive hepatitis B core antibody test in the setting of treatment for the lymphoma requiring Rituxan.  He has been seen and referred by Dr. Janese Simpson performed testing prior to commencing on treatment with Rituxan for lymphoma.  On 03/10/2021 was noted to have positive hepatitis B core antibody with surface antigen negative and surface antibody strongly positive.CT abdomen on 02/26/2021 showed no evidence of portal hypertension   02/27/2021 platelet count is 91  He denies any prior tattoos, military service, blood transfusions, illegal drug use.  He does collect receiving vaccination while at school from the vaccination gun.   Past Medical History:  Diagnosis Date  . 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 Arthritis   . Asthma   . Basal cell carcinoma 01/28/2021   R upper arm, [facility] 03/04/2021  . 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 Chronic pain    right arm  . Depression   . Environmental and seasonal allergies   . Hypertension   . Pneumonia   . Sleep apnea    does not wear CPAP ; uses humidifier instead  . Squamous cell carcinoma of skin 02/19/2021   L inferior mandible, treated with [facility]     Past Surgical History:  Procedure Laterality Date  . ANTERIOR CERVICAL DECOMP/DISCECTOMY FUSION N/A 06/17/2016   Procedure: ANTERIOR CERVICAL DECOMPRESSION FUSION, CERVICAL 3-4, CERVICAL 4-5 WITH INSTRUMENTATION AND ALLOGRAFT;  Surgeon: Russell Bob, MD;  Location: [facility];  Service: Orthopedics;  Laterality: N/A;  ANTERIOR CERVICAL DECOMPRESSION FUSION, CERVICAL 3-4,  CERVICAL 4-5 WITH INSTRUMENTATION AND ALLOGRAFT  . BACK SURGERY    . NECK SURGERY  2009    Prior to Admission medications   Medication Sig Start Date End Date Taking? Authorizing Provider  acyclovir (ZOVIRAX) 400 MG tablet Take 1 tablet (400 mg total) by mouth 2 (two) times daily. 03/27/21   Sindy Guadeloupe, MD  amLODipine (NORVASC) 5 MG tablet Take 1 tablet (5 mg total) by mouth daily. 02/19/21   Russell Sons, MD  aspirin EC 81 MG tablet Take 81 mg by mouth daily. Swallow whole.    [provider]  atorvastatin (LIPITOR) 40 MG tablet Take 1 tablet (40 mg total) by mouth daily. 02/19/21   Russell Sons, MD  Budeson-Glycopyrrol-Formoterol (BREZTRI AEROSPHERE) 160-9-4.8 MCG/ACT AERO Inhale 2 puffs into the lungs 2 (two) times daily. Patient not taking: Reported on 03/27/2021 06/25/20   Russell Pita, MD  cefdinir (OMNICEF) 300 MG capsule Take 1 capsule (300 mg total) by mouth 2 (two) times daily for 7 days. 03/28/21 04/04/21  Russell Patty, MD  clonazePAM (KLONOPIN) 0.5 MG tablet Take 0.5 mg by mouth daily.    Russell Slade, PA-C  DULoxetine (CYMBALTA) 60 MG capsule Take 1 capsule (60 mg total) by mouth daily. 02/19/21   Russell Sons, MD  EUCRISA 2 % OINT Apply  a small amount to affected area   qd/bid to aa's chest, inframammary, eye 08/30/19   [provider]  famotidine (PEPCID) 20 MG tablet  Take 1 tablet (20 mg total) by mouth 2 (two) times daily. 02/19/21   Russell Sons, MD  finasteride (PROSCAR) 5 MG tablet TAKE 1 TABLET BY MOUTH ONCE DAILY 09/12/20   Russell Simpson, Russell Fairly, MD  fluticasone (FLONASE) 50 MCG/ACT nasal spray Place 1 spray into the nose daily.  Patient not taking: Reported on 03/27/2021 01/06/14   [provider]  lansoprazole (PREVACID) 30 MG capsule TAKE 1 CAPSULE BY MOUTH ONCE DAILY AT NOON 10/16/20   Russell Pita, MD  loratadine (CLARITIN) 10 MG tablet Take 10 mg by mouth daily.  12/18/14   [provider]  metoprolol succinate  (TOPROL-XL) 25 MG 24 hr tablet Take 1 tablet (25 mg total) by mouth daily. 02/19/21   Russell Sons, MD  modafinil (PROVIGIL) 200 MG tablet Take 200 mg by mouth daily.    [provider]  montelukast (SINGULAIR) 10 MG tablet Take 1 tablet (10 mg total) by mouth at bedtime. 02/19/21   Russell Sons, MD  mupirocin ointment (BACTROBAN) 2 % Apply 1 application topically 2 (two) times daily. 03/28/21   Russell Patty, MD  naltrexone (DEPADE) 50 MG tablet  10/28/20   [provider]  naproxen sodium (ANAPROX) 220 MG tablet Take 220 mg by mouth 2 (two) times daily with a meal.    [provider]  OLANZapine (ZYPREXA) 7.5 MG tablet Take 7.5 mg by mouth at bedtime.    [provider]  omeprazole (PRILOSEC) 20 MG capsule Take 1 capsule (20 mg total) by mouth 2 (two) times daily before a meal. 02/19/21   Russell Sons, MD  tacrolimus (PROTOPIC) 0.1 % ointment Apply 1 application topically 2 (two) times daily. 09/11/19   [provider]  tadalafil (CIALIS) 20 MG tablet 1 tab 1 hour prior to intercourse 08/23/20   Russell Simpson, Russell Fairly, MD  testosterone cypionate (DEPOTESTOSTERONE CYPIONATE) 200 MG/ML injection Inject 1 mL (200 mg total) into the muscle every 14 (fourteen) days. 02/22/21   Russell Simpson, Russell Fairly, MD  triamcinolone (KENALOG) 0.1 % Apply to affected areas 1-2 times a day until rash improved. Avoid face, groin, underarms. 01/28/21   Russell Patty, MD    Family History  Problem Relation Age of Onset  . Diabetes Mother   . Diabetes Other      Social History   Tobacco Use  . Smoking status: Current Every Day Smoker    Years: 20.00    Types: Cigars  . Smokeless tobacco: Never Used  . Tobacco comment: about 15 cigars/evening  Vaping Use  . Vaping Use: Former  . Start date: 04/30/2017  . Quit date: 11/30/2017  Substance Use Topics  . Alcohol use: Not Currently    Alcohol/week: 0.0 standard drinks    Comment: 12 pack a beer a night  . Drug use: No     Allergies as of 03/31/2021 - Review Complete 03/31/2021  Allergen Reaction Noted  . Penicillins Anaphylaxis 09/03/2015  . Tetanus toxoids Swelling 09/03/2015  . Lisinopril Cough 10/15/2015  . Losartan  10/15/2015  . Tetanus toxoid    . Codeine Nausea Only and Nausea And Vomiting 12/05/2020  . Doxycycline Rash 01/07/2019    Review of Systems:    All systems reviewed and negative except where noted in HPI.   Physical Exam:  BP 133/79 (BP Location: Left Arm, Patient Position: Sitting, Cuff Size: Large)   Pulse (!) 120   Ht $R'6\' 2"'bo$  (1.88 m)   Wt 236 lb 12.8 oz (107.4 kg)  BMI 30.40 kg/m  No LMP for male patient. Psych:  Alert and cooperative. Normal mood and affect. General:   Alert,  Well-developed, well-nourished, pleasant and cooperative in NAD Head:  Normocephalic and atraumatic. Eyes:  Sclera clear, no icterus.   Conjunctiva pink. Ears:  Normal auditory acuity. Neurologic:  Alert and oriented x3;  grossly normal neurologically. Psych:  Alert and cooperative. Normal mood and affect.  Imaging Studies: NM PET Image Initial (PI) Skull Base To Thigh  Result Date: 03/12/2021 CLINICAL DATA:  Initial treatment strategy for follicular lymphoma. EXAM: NUCLEAR MEDICINE PET SKULL BASE TO THIGH TECHNIQUE: 12.83 mCi F-18 FDG was injected intravenously. Full-ring PET imaging was performed from the skull base to thigh after the radiotracer. CT data was obtained and used for attenuation correction and anatomic localization. Fasting blood glucose: 122 mg/dl COMPARISON:  CT chest, abdomen and pelvis 02/25/2021 and abdominopelvic CT 08/14/2020 FINDINGS: Mediastinal blood pool activity: SUV max 1.8 Liver activity: SUV max 3.4 NECK: There are multiple mildly enlarged hypermetabolic cervical lymph nodes bilaterally within stations 2 through 4. Representative nodes include level 2 nodes within SUV max of 7.2 on the right and 5.5 on the left.There is no suspicious activity within the pharyngeal mucosal  space. Activity anteriorly in the oral cavity at the tip of the tongue is likely physiologic. Incidental CT findings: Bilateral carotid atherosclerosis. CHEST: There are multiple hypermetabolic mediastinal, hilar and axillary lymph nodes bilaterally. Representative nodes include a 1.2 cm right paratracheal node on image 88/3 (SUV max 6.2), a 1.9 cm right axillary node (SUV max 6.8) and a 1.6 cm left axillary node (SUV max 7.3). No hypermetabolic pulmonary activity or suspicious pulmonary nodularity. Incidental CT findings: Atherosclerosis of the aorta, great vessels and coronary arteries. ABDOMEN/PELVIS: The spleen is moderately enlarged with hypermetabolic activity (SUV max 6.0). No focal hypermetabolic activity is present within the liver, adrenal glands or pancreas. There are multiple enlarged and hypermetabolic abdominopelvic lymph nodes. Representative nodes include a 2.2 cm aortocaval node on image 205/3 (SUV max 8.9), external iliac nodes on image 257/3 measuring 2.2 cm on the right (SUV max 10.3) and 2.5 cm on the left (SUV max 10.9), and multiple hypermetabolic inguinal lymph nodes bilaterally (up to 8.0 cm on the left). Incidental CT findings: The hepatic density is diffusely decreased, consistent with steatosis. Mild aortic and branch vessel atherosclerosis. Mild sigmoid diverticulosis. SKELETON: Low level metabolic activity is seen throughout the bones, nonspecific. No focal lesions are identified. Incidental CT findings: none IMPRESSION: 1. Multiple hypermetabolic cervical, thoracic, abdominal and pelvic lymph nodes as described consistent with lymphoma ([place] 5). 2. Moderate splenomegaly with associated moderate hypermetabolic activity. 3. Nonspecific mildly increased metabolic activity throughout the bones without focal lesions. No other solid organ parenchymal involvement identified. Electronically Signed   By: Richardean Sale M.D.   On: 03/12/2021 08:22   CT BONE MARROW BIOPSY &  ASPIRATION  Result Date: 03/03/2021 INDICATION: 60 year old male with a history of follicular lymphoma grade 1, referred for biopsy EXAM: CT BONE MARROW BIOPSY AND ASPIRATION MEDICATIONS: None. ANESTHESIA/SEDATION: Moderate (conscious) sedation was employed during this procedure. A total of Versed 2.0 mg and Fentanyl 100 mcg was administered intravenously. Moderate Sedation Time: 12 minutes. The patient's level of consciousness and vital signs were monitored continuously by radiology nursing throughout the procedure under my direct supervision. FLUOROSCOPY TIME:  CT COMPLICATIONS: None PROCEDURE: The procedure risks, benefits, and alternatives were explained to the patient. Questions regarding the procedure were encouraged and answered. The patient understands and consents to  the procedure. Scout CT of the pelvis was performed for surgical planning purposes. The posterior pelvis was prepped with Chlorhexidine in a sterile fashion, and a sterile drape was applied covering the operative field. A sterile gown and sterile gloves were used for the procedure. Local anesthesia was provided with 1% Lidocaine. Posterior right iliac bone was targeted for biopsy. The skin and subcutaneous tissues were infiltrated with 1% lidocaine without epinephrine. A small stab incision was made with an 11 blade scalpel, and an 11 gauge Murphy needle was advanced with CT guidance to the posterior cortex. Manual forced was used to advance the needle through the posterior cortex and the stylet was removed. A bone marrow aspirate was retrieved and passed to a cytotechnologist in the room. The Murphy needle was then advanced without the stylet for a core biopsy. The core biopsy was retrieved and also passed to a cytotechnologist. Manual pressure was used for hemostasis and a sterile dressing was placed. No complications were encountered no significant blood loss was encountered. Patient tolerated the procedure well and remained hemodynamically  stable throughout. IMPRESSION: Status post CT-guided bone marrow biopsy, with tissue specimen sent to pathology for complete histopathologic analysis Signed, Russell Fanny. Earleen Newport, DO [facility] Radiology Electronically Signed   By: Russell Mckusick D.O.   On: 03/03/2021 10:52   [place] CORE BIOPSY (LYMPH NODES)  Result Date: 03/19/2021 INDICATION: History of grade 1 follicular lymphoma with prior right axillary lymph node biopsy on 07/25/2020. Imaging evidence by CT and PET scan of probable progression of lymphoma and need for additional lymph node biopsy to assess status of disease. EXAM: ULTRASOUND GUIDED CORE BIOPSY OF RIGHT INGUINAL LYMPH NODE MEDICATIONS: None. ANESTHESIA/SEDATION: Fentanyl 100 mcg IV; Versed 2.0 mg IV Moderate Sedation Time:  20 minutes. The patient was continuously monitored during the procedure by the interventional radiology nurse under my direct supervision. PROCEDURE: The procedure, risks, benefits, and alternatives were explained to the patient. Questions regarding the procedure were encouraged and answered. The patient understands and consents to the procedure. A time-out was performed prior to initiating the procedure. Bilateral inguinal regions were initially scanned by ultrasound to localize lymphadenopathy. The right groin was prepped with chlorhexidine in a sterile fashion, and a sterile drape was applied covering the operative field. A sterile gown and sterile gloves were used for the procedure. Local anesthesia was provided with 1% Lidocaine. Under ultrasound guidance, 16 gauge core biopsy samples were obtained at the level of an enlarged inferior right inguinal lymph node. Core biopsy samples were submitted on saline soaked Telfa gauze to Pathology. COMPLICATIONS: None immediate. FINDINGS: Multiple enlarged bilateral inguinal lymph nodes are identified. The most accessible in the right groin was a superficial lymph node located in the  inferior inguinal regions/upper medial thigh. This lymph node measures approximately 3.8 x 1.4 x 2.6 cm. Solid core biopsy samples were obtained from different areas of the cortex of the lymph node. IMPRESSION: Ultrasound-guided core biopsy performed of an enlarged right inguinal lymph node. Electronically Signed   By: Russell Edouard M.D.   On: 03/19/2021 11:54    Assessment and Plan:   Jahan Friedlander is a 60 y.o. y/o male has been referred for positive hepatitis B core antibody with a negative surface antigen in the setting of treatment of a lymphoma with Rituxan.  He would be at moderate risk for hepatitis B reactivation as he has a surface antigen that is negative He has normal renal functions  Plan 1.  Check  hepatitis C antibody to rule out infection, hep and HIV antibody 2.  Commence on tenofovir for for  at least 12 months after stoppinganti-CD20 agentssincethere is a lag in the recovery of B cell function among such patients.    Follow up in 8 to 12 weeks  Dr Russell Bellows MD,MRCP(U.K)

## 2021-03-31 NOTE — Telephone Encounter (Deleted)
Got a message from Corky Sox  [11:11 AM] Sofie Rower, I just go toff the phone with Darla Heritage  [11:12 AM] Corky Sox she stated that she woke up to a swollen, warm, buised right upper arm.   [11:12 AM] Corky Sox I saw you were tasked to call her  [11:12 AM] Corky Sox After speaking to her, I feel she needs to be ruled out for a DVT

## 2021-03-31 NOTE — Telephone Encounter (Signed)
Daughter has called again asking to speak with Dr Janese Banks stating that her father has an addiction issue that she needs to discuss with doctor about

## 2021-03-31 NOTE — Telephone Encounter (Signed)
Patient asked if you can send a RX to [facility] in [place] for a Shingles vaccination.

## 2021-03-31 NOTE — Telephone Encounter (Signed)
I called the patient today and he told me his name to verify and I called his house number and let him know that his daughter called today to speak to Dr. Janese Banks about the pt. I know that the time he was in seeing dr Janese Banks his daughter was on cell phone but on the list of people we can talk to  Mendel Ryder was not on the list and we need to get permission for [place] to talk to her. He is agreeable and said yes anytime she needs to talk to MD is fine with him

## 2021-04-01 ENCOUNTER — Other Ambulatory Visit: Payer: Self-pay

## 2021-04-01 ENCOUNTER — Other Ambulatory Visit
Admission: RE | Admit: 2021-04-01 | Discharge: 2021-04-01 | Disposition: A | Discharge status: Home / Self Care | Payer: Medicare HMO | Source: Ambulatory Visit | Attending: Vascular Surgery | Admitting: Vascular Surgery

## 2021-04-01 ENCOUNTER — Telehealth: Payer: Self-pay | Admitting: Gastroenterology

## 2021-04-01 DIAGNOSIS — Z01812 Encounter for preprocedural laboratory examination: Secondary | ICD-10-CM | POA: Insufficient documentation

## 2021-04-01 DIAGNOSIS — Z20822 Contact with and (suspected) exposure to covid-19: Secondary | ICD-10-CM | POA: Insufficient documentation

## 2021-04-01 LAB — SARS CORONAVIRUS 2 (TAT 6-24 HRS): SARS Coronavirus 2: NEGATIVE

## 2021-04-01 MED ORDER — SHINGRIX 50 MCG/0.5ML IM SUSR: 0.5000 mL | Freq: Once | INTRAMUSCULAR | 0 refills | Status: AC

## 2021-04-01 NOTE — Telephone Encounter (Signed)
Shingles vaccine has been sent to Dr. Vicente Males for approval. Durene Cal to [facility].

## 2021-04-01 NOTE — Progress Notes (Signed)
Patient is requesting Shingles vaccine. Sent to Dr. Vicente Males for approval.

## 2021-04-01 NOTE — Telephone Encounter (Signed)
Pharmacist from [facility] asking for RX for this patient for Shingles Vaccine.  Please call back.

## 2021-04-02 NOTE — Telephone Encounter (Signed)
I called drugstore and pt got shingle shot yest. And the immunization was sent in by gi

## 2021-04-02 NOTE — Telephone Encounter (Signed)
I have spoken to Daughter yesterday. Will update team

## 2021-04-03 ENCOUNTER — Encounter
Admission: RE | Disposition: A | Discharge status: Home / Self Care | Payer: Self-pay | Source: Home / Self Care | Attending: Vascular Surgery

## 2021-04-03 ENCOUNTER — Ambulatory Visit
Admission: RE | Admit: 2021-04-03 | Discharge: 2021-04-03 | Disposition: A | Discharge status: Home / Self Care | Payer: Medicare HMO | Attending: Vascular Surgery | Admitting: Vascular Surgery

## 2021-04-03 ENCOUNTER — Other Ambulatory Visit (INDEPENDENT_AMBULATORY_CARE_PROVIDER_SITE_OTHER): Payer: Self-pay | Admitting: Nurse Practitioner

## 2021-04-03 ENCOUNTER — Encounter: Payer: Self-pay | Admitting: Vascular Surgery

## 2021-04-03 ENCOUNTER — Other Ambulatory Visit: Payer: Self-pay

## 2021-04-03 DIAGNOSIS — C8202 Follicular lymphoma grade I, intrathoracic lymph nodes: Secondary | ICD-10-CM | POA: Insufficient documentation

## 2021-04-03 DIAGNOSIS — Z79899 Other long term (current) drug therapy: Secondary | ICD-10-CM | POA: Diagnosis not present

## 2021-04-03 DIAGNOSIS — C8590 Non-Hodgkin lymphoma, unspecified, unspecified site: Secondary | ICD-10-CM | POA: Diagnosis not present

## 2021-04-03 DIAGNOSIS — Z7982 Long term (current) use of aspirin: Secondary | ICD-10-CM | POA: Insufficient documentation

## 2021-04-03 DIAGNOSIS — F17210 Nicotine dependence, cigarettes, uncomplicated: Secondary | ICD-10-CM | POA: Diagnosis not present

## 2021-04-03 HISTORY — PX: PORTA CATH INSERTION: CATH118285

## 2021-04-03 SURGERY — PORTA CATH INSERTION
Anesthesia: Moderate Sedation

## 2021-04-03 MED ORDER — DEXAMETHASONE 4 MG PO TABS: 8.0000 mg | ORAL_TABLET | Freq: Every day | ORAL | 1 refills | Status: DC

## 2021-04-03 MED ORDER — FENTANYL CITRATE (PF) 100 MCG/2ML IJ SOLN
INTRAMUSCULAR | Status: DC | PRN
  Administered 2021-04-03: 50 ug via INTRAVENOUS

## 2021-04-03 MED ORDER — LIDOCAINE-PRILOCAINE 2.5-2.5 % EX CREA: TOPICAL_CREAM | CUTANEOUS | 3 refills | Status: DC

## 2021-04-03 MED ORDER — FENTANYL CITRATE (PF) 100 MCG/2ML IJ SOLN: 12.5000 ug | Freq: Once | INTRAMUSCULAR | Status: DC | PRN

## 2021-04-03 MED ORDER — CLINDAMYCIN PHOSPHATE 300 MG/50ML IV SOLN: 300.0000 mg | Freq: Once | INTRAVENOUS | Status: AC

## 2021-04-03 MED ORDER — ONDANSETRON HCL 8 MG PO TABS: 8.0000 mg | ORAL_TABLET | Freq: Two times a day (BID) | ORAL | 1 refills | Status: DC | PRN

## 2021-04-03 MED ORDER — ALLOPURINOL 300 MG PO TABS: 300.0000 mg | ORAL_TABLET | Freq: Every day | ORAL | 3 refills | Status: DC

## 2021-04-03 MED ORDER — SODIUM CHLORIDE 0.9 % IV SOLN: INTRAVENOUS | Status: DC

## 2021-04-03 MED ORDER — FENTANYL CITRATE (PF) 100 MCG/2ML IJ SOLN
INTRAMUSCULAR | Status: AC
  Filled 2021-04-03: qty 2

## 2021-04-03 MED ORDER — PROCHLORPERAZINE MALEATE 10 MG PO TABS: 10.0000 mg | ORAL_TABLET | Freq: Four times a day (QID) | ORAL | 1 refills | Status: DC | PRN

## 2021-04-03 MED ORDER — METHYLPREDNISOLONE SODIUM SUCC 125 MG IJ SOLR: 125.0000 mg | Freq: Once | INTRAMUSCULAR | Status: DC | PRN

## 2021-04-03 MED ORDER — MIDAZOLAM HCL 2 MG/2ML IJ SOLN
INTRAMUSCULAR | Status: DC | PRN
Start: 2021-04-03 — End: 2021-04-03
  Administered 2021-04-03: 1 mg via INTRAVENOUS
  Administered 2021-04-03: 2 mg via INTRAVENOUS

## 2021-04-03 MED ORDER — MIDAZOLAM HCL 5 MG/5ML IJ SOLN
INTRAMUSCULAR | Status: AC
  Filled 2021-04-03: qty 5

## 2021-04-03 MED ORDER — ONDANSETRON HCL 4 MG/2ML IJ SOLN: 4.0000 mg | Freq: Four times a day (QID) | INTRAMUSCULAR | Status: DC | PRN

## 2021-04-03 MED ORDER — DIPHENHYDRAMINE HCL 50 MG/ML IJ SOLN: 50.0000 mg | Freq: Once | INTRAMUSCULAR | Status: DC | PRN

## 2021-04-03 MED ORDER — MIDAZOLAM HCL 2 MG/ML PO SYRP: 8.0000 mg | ORAL_SOLUTION | Freq: Once | ORAL | Status: DC | PRN

## 2021-04-03 MED ORDER — ACYCLOVIR 400 MG PO TABS: 400.0000 mg | ORAL_TABLET | Freq: Every day | ORAL | 3 refills | Status: DC

## 2021-04-03 MED ORDER — CHLORHEXIDINE GLUCONATE CLOTH 2 % EX PADS
6.0000 | MEDICATED_PAD | Freq: Every day | CUTANEOUS | Status: DC
  Administered 2021-04-03: 6 via TOPICAL

## 2021-04-03 MED ORDER — FAMOTIDINE 20 MG PO TABS: 40.0000 mg | ORAL_TABLET | Freq: Once | ORAL | Status: DC | PRN

## 2021-04-03 MED ORDER — SODIUM CHLORIDE 0.9 % IV SOLN
Freq: Once | INTRAVENOUS | Status: DC
  Filled 2021-04-03: qty 2

## 2021-04-03 MED ORDER — CLINDAMYCIN PHOSPHATE 300 MG/50ML IV SOLN
INTRAVENOUS | Status: AC
  Administered 2021-04-03: 300 mg via INTRAVENOUS
  Filled 2021-04-03: qty 50

## 2021-04-03 SURGICAL SUPPLY — 15 items
ADH SKN CLS APL DERMABOND .7 (GAUZE/BANDAGES/DRESSINGS) ×1
COVER PROBE U/S 5X48 (MISCELLANEOUS) ×1 IMPLANT
COVER SURGICAL LIGHT HANDLE (MISCELLANEOUS) ×1 IMPLANT
DERMABOND ADVANCED (GAUZE/BANDAGES/DRESSINGS) ×1
DERMABOND ADVANCED .7 DNX12 (GAUZE/BANDAGES/DRESSINGS) IMPLANT
ELECT REM PT RETURN 9FT ADLT (ELECTROSURGICAL) ×2
ELECTRODE REM PT RTRN 9FT ADLT (ELECTROSURGICAL) IMPLANT
HANDLE YANKAUER SUCT BULB TIP (MISCELLANEOUS) ×1 IMPLANT
KIT PORT POWER 8FR ISP CVUE (Port) ×1 IMPLANT
PACK ANGIOGRAPHY (CUSTOM PROCEDURE TRAY) ×2 IMPLANT
PENCIL ELECTRO HAND CTR (MISCELLANEOUS) ×1 IMPLANT
SUT MNCRL AB 4-0 PS2 18 (SUTURE) ×1 IMPLANT
SUT VIC AB 3-0 SH 27 (SUTURE) ×2
SUT VIC AB 3-0 SH 27X BRD (SUTURE) IMPLANT
TUBING CONNECTING 10 (TUBING) ×2 IMPLANT

## 2021-04-03 NOTE — Progress Notes (Signed)
START ON PATHWAY REGIMEN - Lymphoma and CLL     A cycle is every 28 days:     Rituximab-xxxx      Bendamustine   **Always confirm dose/schedule in your pharmacy ordering system**  Patient Characteristics: Follicular Lymphoma, Grades 1, 2, and 3A, First Line, Stage III / IV, Symptomatic or Bulky Disease Disease Type: Follicular Lymphoma, Grade 1, 2, or 3A Disease Type: Not Applicable Disease Type: Not Applicable Line of Therapy: First Line Disease Characteristics: Symptomatic or Bulky Disease Intent of Therapy: Non-Curative / Palliative Intent, Discussed with Patient 

## 2021-04-03 NOTE — Discharge Instructions (Signed)
Moderate Conscious Sedation, Adult, Care After This sheet gives you information about how to care for yourself after your procedure. Your health care provider may also give you more specific instructions. If you have problems or questions, contact your health care provider. What can I expect after the procedure? After the procedure, it is common to have:  Sleepiness for several hours.  Impaired judgment for several hours.  Difficulty with balance.  Vomiting if you eat too soon. Follow these instructions at home: For the time period you were told by your health care provider:  Rest.  Do not participate in activities where you could fall or become injured.  Do not drive or use machinery.  Do not drink alcohol.  Do not take sleeping pills or medicines that cause drowsiness.  Do not make important decisions or sign legal documents.  Do not take care of children on your own.      Eating and drinking  Follow the diet recommended by your health care provider.  Drink enough fluid to keep your urine pale yellow.  If you vomit: ? Drink water, juice, or soup when you can drink without vomiting. ? Make sure you have little or no nausea before eating solid foods.   General instructions  Take over-the-counter and prescription medicines only as told by your health care provider.  Have a responsible adult stay with you for the time you are told. It is important to have someone help care for you until you are awake and alert.  Do not smoke.  Keep all follow-up visits as told by your health care provider. This is important. Contact a health care provider if:  You are still sleepy or having trouble with balance after 24 hours.  You feel light-headed.  You keep feeling nauseous or you keep vomiting.  You develop a rash.  You have a fever.  You have redness or swelling around the IV site. Get help right away if:  You have trouble breathing.  You have new-onset confusion at  home. Summary  After the procedure, it is common to feel sleepy, have impaired judgment, or feel nauseous if you eat too soon.  Rest after you get home. Know the things you should not do after the procedure.  Follow the diet recommended by your health care provider and drink enough fluid to keep your urine pale yellow.  Get help right away if you have trouble breathing or new-onset confusion at home. This information is not intended to replace advice given to you by your health care provider. Make sure you discuss any questions you have with your health care provider. Document Revised: 03/15/2020 Document Reviewed: 10/12/2019 Elsevier Patient Education  2021 Elsevier Inc. Implanted Port Insertion, Care After This sheet gives you information about how to care for yourself after your procedure. Your health care provider may also give you more specific instructions. If you have problems or questions, contact your health care provider. What can I expect after the procedure? After the procedure, it is common to have:  Discomfort at the port insertion site.  Bruising on the skin over the port. This should improve over 3-4 days. Follow these instructions at home: Port care  After your port is placed, you will get a manufacturer's information card. The card has information about your port. Keep this card with you at all times.  Take care of the port as told by your health care provider. Ask your health care provider if you or a family member can   get training for taking care of the port at home. A home health care nurse may also take care of the port.  Make sure to remember what type of port you have. Incision care  Follow instructions from your health care provider about how to take care of your port insertion site. Make sure you: ? Wash your hands with soap and water before and after you change your bandage (dressing). If soap and water are not available, use hand sanitizer. ? Change your  dressing as told by your health care provider. ? Leave stitches (sutures), skin glue, or adhesive strips in place. These skin closures may need to stay in place for 2 weeks or longer. If adhesive strip edges start to loosen and curl up, you may trim the loose edges. Do not remove adhesive strips completely unless your health care provider tells you to do that.  Check your port insertion site every day for signs of infection. Check for: ? Redness, swelling, or pain. ? Fluid or blood. ? Warmth. ? Pus or a bad smell.      Activity  Return to your normal activities as told by your health care provider. Ask your health care provider what activities are safe for you.  Do not lift anything that is heavier than 10 lb (4.5 kg), or the limit that you are told, until your health care provider says that it is safe. General instructions  Take over-the-counter and prescription medicines only as told by your health care provider.  Do not take baths, swim, or use a hot tub until your health care provider approves. Ask your health care provider if you may take showers. You may only be allowed to take sponge baths.  Do not drive for 24 hours if you were given a sedative during your procedure.  Wear a medical alert bracelet in case of an emergency. This will tell any health care providers that you have a port.  Keep all follow-up visits as told by your health care provider. This is important. Contact a health care provider if:  You cannot flush your port with saline as directed, or you cannot draw blood from the port.  You have a fever or chills.  You have redness, swelling, or pain around your port insertion site.  You have fluid or blood coming from your port insertion site.  Your port insertion site feels warm to the touch.  You have pus or a bad smell coming from the port insertion site. Get help right away if:  You have chest pain or shortness of breath.  You have bleeding from your port  that you cannot control. Summary  Take care of the port as told by your health care provider. Keep the manufacturer's information card with you at all times.  Change your dressing as told by your health care provider.  Contact a health care provider if you have a fever or chills or if you have redness, swelling, or pain around your port insertion site.  Keep all follow-up visits as told by your health care provider. This information is not intended to replace advice given to you by your health care provider. Make sure you discuss any questions you have with your health care provider. Document Revised: 06/14/2018 Document Reviewed: 06/14/2018 Elsevier Patient Education  2021 Elsevier Inc.  

## 2021-04-03 NOTE — Op Note (Signed)
      [facility]       Operative Note  Date: 04/03/2021  Preoperative diagnosis:  1. lymphoma  Postoperative diagnosis:  Same as above  Procedures: #1. Ultrasound guidance for vascular access to the right internal jugular vein. #2. Fluoroscopic guidance for placement of catheter. #3. Placement of CT compatible Port-A-Cath, right internal jugular vein.  Surgeon: Leotis Pain, MD.   Anesthesia: Local with moderate conscious sedation for approximately 27  minutes using 3 mg of Versed and 50 mcg of Fentanyl  Fluoroscopy time: less than 1 minute  Contrast used: 0  Estimated blood loss: 5 cc  Indication for the procedure:  The patient is a 60 y.o.male with lymphoma.  The patient needs a Port-A-Cath for durable venous access, chemotherapy, lab draws, and CT scans. We are asked to place this. Risks and benefits were discussed and informed consent was obtained.  Description of procedure: The patient was brought to the vascular and interventional radiology suite.  Moderate conscious sedation was administered throughout the procedure during a face to face encounter with the patient with my supervision of the RN administering medicines and monitoring the patient's vital signs, pulse oximetry, telemetry and mental status throughout from the start of the procedure until the patient was taken to the recovery room. The right neck chest and shoulder were sterilely prepped and draped, and a sterile surgical field was created. Ultrasound was used to help visualize a patent right internal jugular vein. This was then accessed under direct ultrasound guidance without difficulty with the Seldinger needle and a permanent image was recorded. A J-wire was placed. After skin nick and dilatation, the peel-away sheath was then placed over the wire. I then anesthetized an area under the clavicle approximately 1-2 fingerbreadths. A transverse incision was created and an inferior pocket was created with  electrocautery and blunt dissection. The port was then brought onto the field, placed into the pocket and secured to the chest wall with 2 Prolene sutures. The catheter was connected to the port and tunneled from the subclavicular incision to the access site. Fluoroscopic guidance was then used to cut the catheter to an appropriate length. The catheter was then placed through the peel-away sheath and the peel-away sheath was removed. The catheter tip was parked in excellent location under fluorocoscopic guidance in the SVC just above the cavoatrial junction. The pocket was then irrigated with antibiotic impregnated saline and the wound was closed with a running 3-0 Vicryl and a 4-0 Monocryl. The access incision was closed with a single 4-0 Monocryl. The Huber needle was used to withdraw blood and flush the port with heparinized saline. Dermabond was then placed as a dressing. The patient tolerated the procedure well and was taken to the recovery room in stable condition.   Leotis Pain 04/03/2021 12:03 PM   This note was created with Dragon Medical transcription system. Any errors in dictation are purely unintentional.

## 2021-04-03 NOTE — Interval H&P Note (Signed)
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  The various methods of treatment have been discussed with the patient and family. After consideration of risks, benefits and other options for treatment, the patient has consented to  Procedure(s): PORTA CATH INSERTION (N/A) as a surgical intervention.  The patient's history has been reviewed, patient examined, no change in status, stable for surgery.  I have reviewed the patient's chart and labs.  Questions were answered to the patient's satisfaction.     Leotis Pain

## 2021-04-03 NOTE — Patient Instructions (Signed)
Rituximab Injection What is this medicine? RITUXIMAB (ri TUX i mab) is a monoclonal antibody. It is used to treat certain types of cancer like non-Hodgkin lymphoma and chronic lymphocytic leukemia. It is also used to treat rheumatoid arthritis, granulomatosis with polyangiitis, microscopic polyangiitis, and pemphigus vulgaris. This medicine may be used for other purposes; ask your health care provider or pharmacist if you have questions. COMMON BRAND NAME(S): RIABNI, Rituxan, RUXIENCE What should I tell my health care provider before I take this medicine? They need to know if you have any of these conditions:  chest pain  heart disease  infection especially a viral infection such as chickenpox, cold sores, hepatitis B, or herpes  immune system problems  irregular heartbeat or rhythm  kidney disease  low blood counts (white cells, platelets, or red cells)  lung disease  recent or upcoming vaccine  an unusual or allergic reaction to rituximab, other medicines, foods, dyes, or preservatives  pregnant or trying to get pregnant  breast-feeding How should I use this medicine? This medicine is injected into a vein. It is given by a health care provider in a hospital or clinic setting. A special MedGuide will be given to you before each treatment. Be sure to read this information carefully each time. Talk to your health care provider about the use of this medicine in children. While this drug may be prescribed for children as young as 2 years for selected conditions, precautions do apply. Overdosage: If you think you have taken too much of this medicine contact a poison control center or emergency room at once. NOTE: This medicine is only for you. Do not share this medicine with others. What if I miss a dose? Keep appointments for follow-up doses. It is important not to miss your dose. Call your health care provider if you are unable to keep an appointment. What may interact with this  medicine? Do not take this medicine with any of the following medicines:  live vaccines This medicine may also interact with the following medicines:  cisplatin This list may not describe all possible interactions. Give your health care provider a list of all the medicines, herbs, non-prescription drugs, or dietary supplements you use. Also tell them if you smoke, drink alcohol, or use illegal drugs. Some items may interact with your medicine. What should I watch for while using this medicine? Your condition will be monitored carefully while you are receiving this medicine. You may need blood work done while you are taking this medicine. This medicine can cause serious infusion reactions. To reduce the risk your health care provider may give you other medicines to take before receiving this one. Be sure to follow the directions from your health care provider. This medicine may increase your risk of getting an infection. Call your health care provider for advice if you get a fever, chills, sore throat, or other symptoms of a cold or flu. Do not treat yourself. Try to avoid being around people who are sick. Call your health care provider if you are around anyone with measles, chickenpox, or if you develop sores or blisters that do not heal properly. Avoid taking medicines that contain aspirin, acetaminophen, ibuprofen, naproxen, or ketoprofen unless instructed by your health care provider. These medicines may hide a fever. This medicine may cause serious skin reactions. They can happen weeks to months after starting the medicine. Contact your health care provider right away if you notice fevers or flu-like symptoms with a rash. The rash may be red   or purple and then turn into blisters or peeling of the skin. Or, you might notice a red rash with swelling of the face, lips or lymph nodes in your neck or under your arms. In some patients, this medicine may cause a serious brain infection that may cause  death. If you have any problems seeing, thinking, speaking, walking, or standing, tell your healthcare professional right away. If you cannot reach your healthcare professional, urgently seek other source of medical care. Do not become pregnant while taking this medicine or for at least 12 months after stopping it. Women should inform their health care provider if they wish to become pregnant or think they might be pregnant. There is potential for serious harm to an unborn child. Talk to your health care provider for more information. Women should use a reliable form of birth control while taking this medicine and for 12 months after stopping it. Do not breast-feed while taking this medicine or for at least 6 months after stopping it. What side effects may I notice from receiving this medicine? Side effects that you should report to your health care provider as soon as possible:  allergic reactions (skin rash, itching or hives; swelling of the face, lips, or tongue)  diarrhea  edema (sudden weight gain; swelling of the ankles, feet, hands or other unusual swelling; trouble breathing)  fast, irregular heartbeat  heart attack (trouble breathing; pain or tightness in the chest, neck, back or arms; unusually weak or tired)  infection (fever, chills, cough, sore throat, pain or trouble passing urine)  kidney injury (trouble passing urine or change in the amount of urine)  liver injury (dark yellow or brown urine; general ill feeling or flu-like symptoms; loss of appetite, right upper belly pain; unusually weak or tired, yellowing of the eyes or skin)  low blood pressure (dizziness; feeling faint or lightheaded, falls; unusually weak or tired)  low red blood cell counts (trouble breathing; feeling faint; lightheaded, falls; unusually weak or tired)  mouth sores  redness, blistering, peeling, or loosening of the skin, including inside the mouth  stomach pain  unusual bruising or  bleeding  wheezing (trouble breathing with loud or whistling sounds)  vomiting Side effects that usually do not require medical attention (report to your health care provider if they continue or are bothersome):  headache  joint pain  muscle cramps, pain  nausea This list may not describe all possible side effects. Call your doctor for medical advice about side effects. You may report side effects to FDA at 1-800-FDA-1088. Where should I keep my medicine? This medicine is given in a hospital or clinic. It will not be stored at home. NOTE: This sheet is a summary. It may not cover all possible information. If you have questions about this medicine, talk to your doctor, pharmacist, or health care provider.  2021 Elsevier/Gold Standard (2020-08-29 21:35:50) Bendamustine Injection What is this medicine? BENDAMUSTINE (BEN da MUS teen) is a chemotherapy drug. It is used to treat chronic lymphocytic leukemia and non-Hodgkin lymphoma. This medicine may be used for other purposes; ask your health care provider or pharmacist if you have questions. COMMON BRAND NAME(S): BELRAPZO, BENDEKA, Treanda What should I tell my health care provider before I take this medicine? They need to know if you have any of these conditions:  infection (especially a virus infection such as chickenpox, cold sores, or herpes)  kidney disease  liver disease  an unusual or allergic reaction to bendamustine, mannitol, other medicines, foods, dyes,   or preservatives  pregnant or trying to get pregnant  breast-feeding How should I use this medicine? This medicine is for infusion into a vein. It is given by a health care professional in a hospital or clinic setting. Talk to your pediatrician regarding the use of this medicine in children. Special care may be needed. Overdosage: If you think you have taken too much of this medicine contact a poison control center or emergency room at once. NOTE: This medicine is  only for you. Do not share this medicine with others. What if I miss a dose? It is important not to miss your dose. Call your doctor or health care professional if you are unable to keep an appointment. What may interact with this medicine? Do not take this medicine with any of the following medications:  clozapine This medicine may also interact with the following medications:  atazanavir  cimetidine  ciprofloxacin  enoxacin  fluvoxamine  medicines for seizures like carbamazepine and phenobarbital  mexiletine  rifampin  tacrine  thiabendazole  zileuton This list may not describe all possible interactions. Give your health care provider a list of all the medicines, herbs, non-prescription drugs, or dietary supplements you use. Also tell them if you smoke, drink alcohol, or use illegal drugs. Some items may interact with your medicine. What should I watch for while using this medicine? This drug may make you feel generally unwell. This is not uncommon, as chemotherapy can affect healthy cells as well as cancer cells. Report any side effects. Continue your course of treatment even though you feel ill unless your doctor tells you to stop. You may need blood work done while you are taking this medicine. Call your doctor or healthcare provider for advice if you get a fever, chills or sore throat, or other symptoms of a cold or flu. Do not treat yourself. This drug decreases your body's ability to fight infections. Try to avoid being around people who are sick. This medicine may cause serious skin reactions. They can happen weeks to months after starting the medicine. Contact your healthcare provider right away if you notice fevers or flu-like symptoms with a rash. The rash may be red or purple and then turn into blisters or peeling of the skin. Or, you might notice a red rash with swelling of the face, lips or lymph nodes in your neck or under your arms. In some patients, this medicine  may cause a serious brain infection that may cause death. If you have any problems seeing, thinking, speaking, walking, or standing, tell your health care provider right away. If you cannot reach your health care provider, urgently seek other source of medical care. This medicine may increase your risk to bruise or bleed. Call your doctor or healthcare provider if you notice any unusual bleeding. Talk to your doctor about your risk of cancer. You may be more at risk for certain types of cancers if you take this medicine. This medicine may increase your risk of skin cancer. Check your skin for changes to moles or for new growths while taking this medicine. Call your health care provider if you notice any of these skin changes. Do not become pregnant while taking this medicine or for at least 6 months after stopping it. Women should inform their doctor if they wish to become pregnant or think they might be pregnant. Men should not father a child while taking this medicine and for at least 3 months after stopping it. There is a potential for   serious side effects to an unborn child. Talk to your healthcare provider or pharmacist for more information. Do not breast-feed an infant while taking this medicine or for at least 1 week after stopping it. This medicine may make it more difficult to father a child. You should talk with your doctor or healthcare provider if you are concerned about your fertility. What side effects may I notice from receiving this medicine? Side effects that you should report to your doctor or health care professional as soon as possible:  allergic reactions like skin rash, itching or hives, swelling of the face, lips, or tongue  low blood counts - this medicine may decrease the number of white blood cells, red blood cells and platelets. You may be at increased risk for infections and bleeding.  rash, fever, and swollen lymph nodes  redness, blistering, peeling, or loosening of the  skin, including inside the mouth  signs of infection like fever or chills, cough, sore throat, pain or difficulty passing urine  signs of decreased platelets or bleeding like bruising, pinpoint red spots on the skin, black, tarry stools, blood in the urine  signs of decreased red blood cells like being unusually weak or tired, fainting spells, lightheadedness  signs and symptoms of kidney injury like trouble passing urine or change in the amount of urine  signs and symptoms of liver injury like dark yellow or brown urine; general ill feeling or flu-like symptoms; light-colored stools; loss of appetite; nausea; right upper belly pain; unusually weak or tired; yellowing of the eyes or skin Side effects that usually do not require medical attention (report to your doctor or health care professional if they continue or are bothersome):  constipation  decreased appetite  diarrhea  headache  mouth sores  nausea, vomiting  tiredness This list may not describe all possible side effects. Call your doctor for medical advice about side effects. You may report side effects to FDA at 1-800-FDA-1088. Where should I keep my medicine? This drug is given in a hospital or clinic and will not be stored at home. NOTE: This sheet is a summary. It may not cover all possible information. If you have questions about this medicine, talk to your doctor, pharmacist, or health care provider.  2021 [employer]vier/Gold Standard (2020-05-13 12:11:43)  

## 2021-04-04 ENCOUNTER — Encounter: Payer: Self-pay | Admitting: Vascular Surgery

## 2021-04-04 NOTE — Progress Notes (Signed)
Hematology/Oncology Consult note [facility]  Telephone:([phone] [phone]  Patient Care Team: Jerrol Banana., MD as PCP - General (Family Medicine) Kate Sable, MD as PCP - Cardiology (Cardiology) Sindy Guadeloupe, MD as Consulting Physician (Oncology)   Name of the patient: Russell Simpson  [id number]  [birth year]   Date of visit: 04/04/21  Diagnosis-stage IV low-grade follicular lymphoma  Chief complaint/ Reason for visit-discussed biopsy results and further management  Heme/Onc history:  patient is a 60 year old male with a past medical history significant for hypertension, hyperlipidemia, hypogonadism, pituitary adenoma who recently had a fall on in December 2020.[name] He sustained a left anterior frontal sinus as well as supraorbital rim fracture on 11/21/2019 which was managed conservatively. He then presented to the ER at [facility] with symptoms of right-sided chest wall pain this led to a CT PE which did not show any evidence of pulmonary embolism. He was noted to have bilateral symmetric axillary adenopathy measuring up to 1.8 cm. Scattered mediastinal adenopathy. Right paratracheal node measuring up to 1.2 cm. Prominent right costophrenic lymph node measuring up to 1 cm. Findings are nonspecific but could be seen with lymphoma versus systemic inflammatory disease such as sarcoidosis. Of note patient has had a prior CT scan for lung screening back in 2015 when he was not noted to have this adenopathy.  Repeat CT chest in August 2021 showed persistent mild nonbulky axillary and mediastinal adenopathy which had not changed significantly as compared to his prior CT in December 2020. Core biopsy of the axillary lymph nodes was consistent with low-grade follicular lymphoma grade 1 to2.  Repeat CT in March 2022 showed Progression and multistation adenopathy as well as significant splenomegaly measuring 20.4 cm as compared to 14.5 cm on  CT scan September 2021.  CT scan showed multistation adenopathy with SUVs ranging between 6-8.9.  Patient underwent another core biopsy of right inguinal lymph node which was consistent with follicular lymphoma grade 1-2.  Bone marrow biopsy also showed moderate involvement with non-Hodgkin's B-cell lymphoma.  Baseline hepatitis B testing showed core antibody positivity.   Interval history-patient reports feeling at his baseline.  Has occasional abdominal discomfort but denies other complaints.  He drinks alcohol regularly.  ECOG PS- 1 Pain scale- 0   Review of systems- Review of Systems  Constitutional: Positive for malaise/fatigue. Negative for chills, fever and weight loss.  HENT: Negative for congestion, ear discharge and nosebleeds.   Eyes: Negative for blurred vision.  Respiratory: Negative for cough, hemoptysis, sputum production, shortness of breath and wheezing.   Cardiovascular: Negative for chest pain, palpitations, orthopnea and claudication.  Gastrointestinal: Negative for abdominal pain, blood in stool, constipation, diarrhea, heartburn, melena, nausea and vomiting.  Genitourinary: Negative for dysuria, flank pain, frequency, hematuria and urgency.  Musculoskeletal: Negative for back pain, joint pain and myalgias.  Skin: Negative for rash.  Neurological: Negative for dizziness, tingling, focal weakness, seizures, weakness and headaches.  Endo/Heme/Allergies: Does not bruise/bleed easily.  Psychiatric/Behavioral: Negative for depression and suicidal ideas. The patient does not have insomnia.      Allergies  Allergen Reactions  . Penicillins Anaphylaxis  . Tetanus Toxoids Swelling  . Lisinopril Cough  . Losartan      Other reaction(s): Muscle Pain    . Tetanus Toxoid   . Codeine Nausea Only and Nausea And Vomiting  . Doxycycline Rash     Past Medical History:  Diagnosis Date  . 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 Arthritis   .  Asthma   . Basal cell  carcinoma 01/28/2021   R upper arm, [facility] 03/04/2021  . Brain tumor (benign) ([place])    benign pituitary neoplasm  . Chronic pain    right arm  . Depression   . Environmental and seasonal allergies   . Hypertension   . Pneumonia   . Sleep apnea    does not wear CPAP ; uses humidifier instead  . Squamous cell carcinoma of skin 02/19/2021   L inferior mandible, treated with [facility]      Past Surgical History:  Procedure Laterality Date  . ANTERIOR CERVICAL DECOMP/DISCECTOMY FUSION N/A 06/17/2016   Procedure: ANTERIOR CERVICAL DECOMPRESSION FUSION, CERVICAL 3-4, CERVICAL 4-5 WITH INSTRUMENTATION AND ALLOGRAFT;  Surgeon: Phylliss Bob, MD;  Location: [place];  Service: Orthopedics;  Laterality: N/A;  ANTERIOR CERVICAL DECOMPRESSION FUSION, CERVICAL 3-4, CERVICAL 4-5 WITH INSTRUMENTATION AND ALLOGRAFT  . BACK SURGERY    . NECK SURGERY  2009  . PORTA CATH INSERTION N/A 04/03/2021   Procedure: PORTA CATH INSERTION;  Surgeon: Algernon Huxley, MD;  Location: [place] [facility];  Service: Cardiovascular;  Laterality: N/A;    Social History   Socioeconomic History  . Marital status: Divorced    Spouse name: Not on file  . Number of children: Not on file  . Years of education: Not on file  . Highest education level: Not on file  Occupational History  . Not on file  Tobacco Use  . Smoking status: Current Every Day Smoker    Years: 20.00    Types: Cigars  . Smokeless tobacco: Never Used  . Tobacco comment: about 15 cigars/evening  Vaping Use  . Vaping Use: Former  . Start date: 04/30/2017  . Quit date: 11/30/2017  Substance and Sexual Activity  . Alcohol use: Not Currently    Alcohol/week: 0.0 standard drinks  . Drug use: No  . Sexual activity: Not Currently  Other Topics Concern  . Not on file  Social History Narrative  . Not on file   Social Determinants of Health   Financial Resource Strain: Not on file  Food Insecurity: Not on file  Transportation Needs: Not on file  Physical  Activity: Not on file  Stress: Not on file  Social Connections: Not on file  Intimate Partner Violence: Not on file    Family History  Problem Relation Age of Onset  . Diabetes Mother   . Diabetes Other      Current Outpatient Medications:  .  acyclovir (ZOVIRAX) 400 MG tablet, Take 1 tablet (400 mg total) by mouth 2 (two) times daily., Disp: 60 tablet, Rfl: 2 .  amLODipine (NORVASC) 5 MG tablet, Take 1 tablet (5 mg total) by mouth daily., Disp: 90 tablet, Rfl: 0 .  aspirin EC 81 MG tablet, Take 81 mg by mouth daily. Swallow whole., Disp: , Rfl:  .  atorvastatin (LIPITOR) 40 MG tablet, Take 1 tablet (40 mg total) by mouth daily., Disp: 90 tablet, Rfl: 4 .  clonazePAM (KLONOPIN) 0.5 MG tablet, Take 0.5 mg by mouth daily., Disp: , Rfl:  .  DULoxetine (CYMBALTA) 60 MG capsule, Take 1 capsule (60 mg total) by mouth daily., Disp: 90 capsule, Rfl: 0 .  EUCRISA 2 % OINT, Apply  a small amount to affected area   qd/bid to aa's chest, inframammary, eye, Disp: , Rfl:  .  famotidine (PEPCID) 20 MG tablet, Take 1 tablet (20 mg total) by mouth 2 (two) times daily., Disp: 180 tablet, Rfl: 0 .  finasteride (PROSCAR) 5 MG tablet, TAKE 1 TABLET BY MOUTH ONCE DAILY, Disp: 90 tablet, Rfl: 2 .  lansoprazole (PREVACID) 30 MG capsule, TAKE 1 CAPSULE BY MOUTH ONCE DAILY AT NOON, Disp: 30 capsule, Rfl: 3 .  loratadine (CLARITIN) 10 MG tablet, Take 10 mg by mouth daily. , Disp: , Rfl:  .  metoprolol succinate (TOPROL-XL) 25 MG 24 hr tablet, Take 1 tablet (25 mg total) by mouth daily., Disp: 90 tablet, Rfl: 1 .  modafinil (PROVIGIL) 200 MG tablet, Take 200 mg by mouth daily., Disp: , Rfl:  .  montelukast (SINGULAIR) 10 MG tablet, Take 1 tablet (10 mg total) by mouth at bedtime., Disp: 90 tablet, Rfl: 1 .  OLANZapine (ZYPREXA) 7.5 MG tablet, Take 7.5 mg by mouth at bedtime., Disp: , Rfl:  .  omeprazole (PRILOSEC) 20 MG capsule, Take 1 capsule (20 mg total) by mouth 2 (two) times daily before a meal., Disp: 180  capsule, Rfl: 0 .  tacrolimus (PROTOPIC) 0.1 % ointment, Apply 1 application topically 2 (two) times daily., Disp: , Rfl:  .  testosterone cypionate (DEPOTESTOSTERONE CYPIONATE) 200 MG/ML injection, Inject 1 mL (200 mg total) into the muscle every 14 (fourteen) days., Disp: 10 mL, Rfl: 0 .  triamcinolone (KENALOG) 0.1 %, Apply to affected areas 1-2 times a day until rash improved. Avoid face, groin, underarms., Disp: 80 g, Rfl: 1 .  acyclovir (ZOVIRAX) 400 MG tablet, Take 1 tablet (400 mg total) by mouth daily., Disp: 30 tablet, Rfl: 3 .  allopurinol (ZYLOPRIM) 300 MG tablet, Take 1 tablet (300 mg total) by mouth daily., Disp: 30 tablet, Rfl: 3 .  Budeson-Glycopyrrol-Formoterol (BREZTRI AEROSPHERE) 160-9-4.8 MCG/ACT AERO, Inhale 2 puffs into the lungs 2 (two) times daily. (Patient not taking: No sig reported), Disp: 5.9 g, Rfl: 0 .  cefdinir (OMNICEF) 300 MG capsule, Take 1 capsule (300 mg total) by mouth 2 (two) times daily for 7 days., Disp: 14 capsule, Rfl: 0 .  dexamethasone (DECADRON) 4 MG tablet, Take 2 tablets (8 mg total) by mouth daily. Start the day after bendamustine chemotherapy for 2 days. Take with food., Disp: 30 tablet, Rfl: 1 .  fluticasone (FLONASE) 50 MCG/ACT nasal spray, Place 1 spray into the nose daily.  (Patient not taking: No sig reported), Disp: , Rfl:  .  lidocaine-prilocaine (EMLA) cream, Apply to affected area once, Disp: 30 g, Rfl: 3 .  mupirocin ointment (BACTROBAN) 2 %, Apply 1 application topically 2 (two) times daily., Disp: 22 g, Rfl: 0 .  naltrexone (DEPADE) 50 MG tablet, , Disp: , Rfl:  .  ondansetron (ZOFRAN) 8 MG tablet, Take 1 tablet (8 mg total) by mouth 2 (two) times daily as needed for refractory nausea / vomiting. Start on day 2 after bendamustine chemo., Disp: 30 tablet, Rfl: 1 .  prochlorperazine (COMPAZINE) 10 MG tablet, Take 1 tablet (10 mg total) by mouth every 6 (six) hours as needed (Nausea or vomiting)., Disp: 30 tablet, Rfl: 1 .  tadalafil (CIALIS)  20 MG tablet, 1 tab 1 hour prior to intercourse, Disp: 10 tablet, Rfl: 0 .  tenofovir (VIREAD) 300 MG tablet, Take 1 tablet (300 mg total) by mouth daily., Disp: 90 tablet, Rfl: 2  Physical exam:  Vitals:   03/27/21 1025  BP: 134/88  Pulse: (!) 112  Resp: 20  Temp: 97.7 F (36.5 C)  TempSrc: Tympanic  SpO2: 92%  Weight: 240 lb 6.4 oz (109 kg)   Physical Exam Constitutional:      General: He   is not in acute distress. Cardiovascular:     Rate and Rhythm: Normal rate.     Heart sounds: Normal heart sounds.  Pulmonary:     Effort: Pulmonary effort is normal.  Skin:    General: Skin is warm and dry.  Neurological:     Mental Status: He is alert and oriented to person, place, and time.      CMP Latest Ref Rng & Units 02/27/2021  Glucose 70 - 99 mg/dL 112(H)  BUN 6 - 20 mg/dL 12  Creatinine 0.61 - 1.24 mg/dL 0.91  Sodium 135 - 145 mmol/L 136  Potassium 3.5 - 5.1 mmol/L 4.3  Chloride 98 - 111 mmol/L 98  CO2 22 - 32 mmol/L 24  Calcium 8.9 - 10.3 mg/dL 8.9  Total Protein 6.5 - 8.1 g/dL 6.9  Total Bilirubin 0.3 - 1.2 mg/dL 0.8  Alkaline Phos 38 - 126 U/L 140(H)  AST 15 - 41 U/L 28  ALT 0 - 44 U/L 22   CBC Latest Ref Rng & Units 03/03/2021  WBC 4.0 - 10.5 K/uL 8.6  Hemoglobin 13.0 - 17.0 g/dL 14.8  Hematocrit 39.0 - 52.0 % 42.7  Platelets 150 - 400 K/uL 119(L)       PERIPHERAL VASCULAR CATHETERIZATION  Result Date: 04/03/2021 See op note  NM PET Image Initial (PI) Skull Base To Thigh  Addendum Date: 04/01/2021   ADDENDUM REPORT: 04/01/2021 12:29 ADDENDUM: In reviewing the pathology for this case, an error was noted in this report. Under the abdomen pelvis section, the last line should read "., and multiple hypermetabolic inguinal lymph nodes bilaterally (up to 8.0 SUV max on the left). This node measures 2.1 cm short axis" (not 8.0 cm). Electronically Signed   By: Richardean Sale M.D.   On: 04/01/2021 12:29   Result Date: 04/01/2021 CLINICAL DATA:  Initial treatment  strategy for follicular lymphoma. EXAM: NUCLEAR MEDICINE PET SKULL BASE TO THIGH TECHNIQUE: 12.83 mCi F-18 FDG was injected intravenously. Full-ring PET imaging was performed from the skull base to thigh after the radiotracer. CT data was obtained and used for attenuation correction and anatomic localization. Fasting blood glucose: 122 mg/dl COMPARISON:  CT chest, abdomen and pelvis 02/25/2021 and abdominopelvic CT 08/14/2020 FINDINGS: Mediastinal blood pool activity: SUV max 1.8 Liver activity: SUV max 3.4 NECK: There are multiple mildly enlarged hypermetabolic cervical lymph nodes bilaterally within stations 2 through 4. Representative nodes include level 2 nodes within SUV max of 7.2 on the right and 5.5 on the left.There is no suspicious activity within the pharyngeal mucosal space. Activity anteriorly in the oral cavity at the tip of the tongue is likely physiologic. Incidental CT findings: Bilateral carotid atherosclerosis. CHEST: There are multiple hypermetabolic mediastinal, hilar and axillary lymph nodes bilaterally. Representative nodes include a 1.2 cm right paratracheal node on image 88/3 (SUV max 6.2), a 1.9 cm right axillary node (SUV max 6.8) and a 1.6 cm left axillary node (SUV max 7.3). No hypermetabolic pulmonary activity or suspicious pulmonary nodularity. Incidental CT findings: Atherosclerosis of the aorta, great vessels and coronary arteries. ABDOMEN/PELVIS: The spleen is moderately enlarged with hypermetabolic activity (SUV max 6.0). No focal hypermetabolic activity is present within the liver, adrenal glands or pancreas. There are multiple enlarged and hypermetabolic abdominopelvic lymph nodes. Representative nodes include a 2.2 cm aortocaval node on image 205/3 (SUV max 8.9), external iliac nodes on image 257/3 measuring 2.2 cm on the right (SUV max 10.3) and 2.5 cm on the left (SUV max 10.9), and multiple  hypermetabolic inguinal lymph nodes bilaterally (up to 8.0 cm on the left). Incidental  CT findings: The hepatic density is diffusely decreased, consistent with steatosis. Mild aortic and branch vessel atherosclerosis. Mild sigmoid diverticulosis. SKELETON: Low level metabolic activity is seen throughout the bones, nonspecific. No focal lesions are identified. Incidental CT findings: none IMPRESSION: 1. Multiple hypermetabolic cervical, thoracic, abdominal and pelvic lymph nodes as described consistent with lymphoma ([place] 5). 2. Moderate splenomegaly with associated moderate hypermetabolic activity. 3. Nonspecific mildly increased metabolic activity throughout the bones without focal lesions. No other solid organ parenchymal involvement identified. Electronically Signed: By: Richardean Sale M.D. On: 03/12/2021 08:22   [place] CORE BIOPSY (LYMPH NODES)  Result Date: 03/19/2021 INDICATION: History of grade 1 follicular lymphoma with prior right axillary lymph node biopsy on 07/25/2020. Imaging evidence by CT and PET scan of probable progression of lymphoma and need for additional lymph node biopsy to assess status of disease. EXAM: ULTRASOUND GUIDED CORE BIOPSY OF RIGHT INGUINAL LYMPH NODE MEDICATIONS: None. ANESTHESIA/SEDATION: Fentanyl 100 mcg IV; Versed 2.0 mg IV Moderate Sedation Time:  20 minutes. The patient was continuously monitored during the procedure by the interventional radiology nurse under my direct supervision. PROCEDURE: The procedure, risks, benefits, and alternatives were explained to the patient. Questions regarding the procedure were encouraged and answered. The patient understands and consents to the procedure. A time-out was performed prior to initiating the procedure. Bilateral inguinal regions were initially scanned by ultrasound to localize lymphadenopathy. The right groin was prepped with chlorhexidine in a sterile fashion, and a sterile drape was applied covering the operative field. A sterile gown and sterile gloves were used for the procedure. Local anesthesia was  provided with 1% Lidocaine. Under ultrasound guidance, 16 gauge core biopsy samples were obtained at the level of an enlarged inferior right inguinal lymph node. Core biopsy samples were submitted on saline soaked Telfa gauze to Pathology. COMPLICATIONS: None immediate. FINDINGS: Multiple enlarged bilateral inguinal lymph nodes are identified. The most accessible in the right groin was a superficial lymph node located in the inferior inguinal regions/upper medial thigh. This lymph node measures approximately 3.8 x 1.4 x 2.6 cm. Solid core biopsy samples were obtained from different areas of the cortex of the lymph node. IMPRESSION: Ultrasound-guided core biopsy performed of an enlarged right inguinal lymph node. Electronically Signed   By: Aletta Edouard M.D.   On: 03/19/2021 11:54     Assessment and plan- Patient is a 60 y.o. male with stage IV follicular lymphoma here to discuss further management  As compared to September 2021 patient has had progressive splenomegaly from 14.5 cm to 20 cm presently.  There is also been interval enlargement and multistation adenopathy although they are not significantly bulky.  Inguinal lymph node was biopsied again and was consistent with low-grade follicular lymphoma grade 1 through 2.  Given his significant splenomegaly it would be reasonable to offer him treatment for his follicular lymphoma at this time.  Although patient consumes alcohol on a regular basis he does not have any overt evidence of chronic liver disease that would explain the splenomegaly.  His LFTs are normal.  I recommend 6 cycles of Bendamustine and Rituxan given IV every 4 weeks.  Bendamustine would be given on days 1 and 2.  Discussed risks and benefits of treatment including all but not limited to nausea, vomiting, low blood counts, risk of infections and hospitalization.  Risk of infusion reaction associated with Rituxan and leg swelling associated with Bendamustine.  Treatment will be  given with  a palliative intent.  Interim scans to be done after 3 cycles to assess response to treatment.  Patient has core antibody positivity on hepatitis B testing.  There is a risk of hepatitis B reactivation with Rituxan.  I will therefore refer him to GI for initiating hepatitis B prophylaxis prior to Rituxan.  Patient is not vaccinated against COVID and I would recommend Evushield antibody treatment for COVID prophylaxis.  Patient is willing to proceed as planned.  I will tentatively see him back in 1 week's time for cycle 1 of Bendamustine Rituxan chemotherapy.  He will need chemo teach and port placement prior  Cancer Staging 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 ([place]) Staging form: Hodgkin and Non-Hodgkin Lymphoma, AJCC 8th Edition - Clinical stage from 01/04/4269: Stage IV (Follicular lymphoma) - Signed by Sindy Guadeloupe, MD on 04/04/2021 Stage prefix: Initial diagnosis     Visit Diagnosis 1. 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 ([place])   2. Hepatitis B core antibody positive      Dr. Randa Evens, MD, MPH [facility] [phone] 04/04/2021 2:11 PM

## 2021-04-07 ENCOUNTER — Inpatient Hospital Stay: Payer: Medicare HMO | Attending: Oncology

## 2021-04-07 DIAGNOSIS — D352 Benign neoplasm of pituitary gland: Secondary | ICD-10-CM | POA: Insufficient documentation

## 2021-04-07 DIAGNOSIS — Z79899 Other long term (current) drug therapy: Secondary | ICD-10-CM | POA: Insufficient documentation

## 2021-04-07 DIAGNOSIS — E785 Hyperlipidemia, unspecified: Secondary | ICD-10-CM | POA: Insufficient documentation

## 2021-04-07 DIAGNOSIS — B1910 Unspecified viral hepatitis B without hepatic coma: Secondary | ICD-10-CM | POA: Insufficient documentation

## 2021-04-07 DIAGNOSIS — E291 Testicular hypofunction: Secondary | ICD-10-CM | POA: Insufficient documentation

## 2021-04-07 DIAGNOSIS — Z85828 Personal history of other malignant neoplasm of skin: Secondary | ICD-10-CM | POA: Insufficient documentation

## 2021-04-07 DIAGNOSIS — Z5111 Encounter for antineoplastic chemotherapy: Secondary | ICD-10-CM | POA: Insufficient documentation

## 2021-04-07 DIAGNOSIS — M79601 Pain in right arm: Secondary | ICD-10-CM | POA: Insufficient documentation

## 2021-04-07 DIAGNOSIS — R161 Splenomegaly, not elsewhere classified: Secondary | ICD-10-CM | POA: Insufficient documentation

## 2021-04-07 DIAGNOSIS — C8208 Follicular lymphoma grade I, lymph nodes of multiple sites: Secondary | ICD-10-CM | POA: Insufficient documentation

## 2021-04-07 DIAGNOSIS — I10 Essential (primary) hypertension: Secondary | ICD-10-CM | POA: Insufficient documentation

## 2021-04-07 DIAGNOSIS — D696 Thrombocytopenia, unspecified: Secondary | ICD-10-CM | POA: Insufficient documentation

## 2021-04-07 DIAGNOSIS — F17290 Nicotine dependence, other tobacco product, uncomplicated: Secondary | ICD-10-CM | POA: Insufficient documentation

## 2021-04-07 DIAGNOSIS — G8929 Other chronic pain: Secondary | ICD-10-CM | POA: Insufficient documentation

## 2021-04-07 DIAGNOSIS — M1990 Unspecified osteoarthritis, unspecified site: Secondary | ICD-10-CM | POA: Insufficient documentation

## 2021-04-07 DIAGNOSIS — Z86011 Personal history of benign neoplasm of the brain: Secondary | ICD-10-CM | POA: Insufficient documentation

## 2021-04-08 ENCOUNTER — Inpatient Hospital Stay: Payer: Medicare HMO

## 2021-04-08 ENCOUNTER — Inpatient Hospital Stay (HOSPITAL_BASED_OUTPATIENT_CLINIC_OR_DEPARTMENT_OTHER): Payer: Medicare HMO | Admitting: Oncology

## 2021-04-08 ENCOUNTER — Encounter: Payer: Self-pay | Admitting: Oncology

## 2021-04-08 ENCOUNTER — Other Ambulatory Visit: Payer: Self-pay

## 2021-04-08 VITALS — BP 124/82 | HR 108 | Temp 98.8°F | Resp 20

## 2021-04-08 VITALS — BP 124/87 | HR 112 | Temp 99.1°F | Resp 16 | Ht 74.0 in | Wt 234.4 lb

## 2021-04-08 DIAGNOSIS — Z86011 Personal history of benign neoplasm of the brain: Secondary | ICD-10-CM | POA: Diagnosis not present

## 2021-04-08 DIAGNOSIS — Z85828 Personal history of other malignant neoplasm of skin: Secondary | ICD-10-CM | POA: Diagnosis not present

## 2021-04-08 DIAGNOSIS — D696 Thrombocytopenia, unspecified: Secondary | ICD-10-CM | POA: Diagnosis not present

## 2021-04-08 DIAGNOSIS — C8208 Follicular lymphoma grade I, lymph nodes of multiple sites: Secondary | ICD-10-CM | POA: Diagnosis not present

## 2021-04-08 DIAGNOSIS — C8202 Follicular lymphoma grade I, intrathoracic lymph nodes: Secondary | ICD-10-CM | POA: Diagnosis not present

## 2021-04-08 DIAGNOSIS — F17290 Nicotine dependence, other tobacco product, uncomplicated: Secondary | ICD-10-CM | POA: Diagnosis not present

## 2021-04-08 DIAGNOSIS — B1910 Unspecified viral hepatitis B without hepatic coma: Secondary | ICD-10-CM | POA: Diagnosis not present

## 2021-04-08 DIAGNOSIS — M79601 Pain in right arm: Secondary | ICD-10-CM | POA: Diagnosis not present

## 2021-04-08 DIAGNOSIS — Z5181 Encounter for therapeutic drug level monitoring: Secondary | ICD-10-CM

## 2021-04-08 DIAGNOSIS — D352 Benign neoplasm of pituitary gland: Secondary | ICD-10-CM | POA: Diagnosis not present

## 2021-04-08 DIAGNOSIS — Z79899 Other long term (current) drug therapy: Secondary | ICD-10-CM

## 2021-04-08 DIAGNOSIS — E785 Hyperlipidemia, unspecified: Secondary | ICD-10-CM | POA: Diagnosis not present

## 2021-04-08 DIAGNOSIS — M1990 Unspecified osteoarthritis, unspecified site: Secondary | ICD-10-CM | POA: Diagnosis not present

## 2021-04-08 DIAGNOSIS — R161 Splenomegaly, not elsewhere classified: Secondary | ICD-10-CM

## 2021-04-08 DIAGNOSIS — G8929 Other chronic pain: Secondary | ICD-10-CM | POA: Diagnosis not present

## 2021-04-08 DIAGNOSIS — E291 Testicular hypofunction: Secondary | ICD-10-CM | POA: Diagnosis not present

## 2021-04-08 DIAGNOSIS — Z5111 Encounter for antineoplastic chemotherapy: Secondary | ICD-10-CM | POA: Diagnosis not present

## 2021-04-08 DIAGNOSIS — I10 Essential (primary) hypertension: Secondary | ICD-10-CM | POA: Diagnosis not present

## 2021-04-08 LAB — COMPREHENSIVE METABOLIC PANEL
ALT: 19 U/L (ref 0–44)
AST: 32 U/L (ref 15–41)
Albumin: 3.9 g/dL (ref 3.5–5.0)
Alkaline Phosphatase: 146 U/L — ABNORMAL HIGH (ref 38–126)
Anion gap: 12 (ref 5–15)
BUN: 12 mg/dL (ref 6–20)
CO2: 25 mmol/L (ref 22–32)
Calcium: 8.9 mg/dL (ref 8.9–10.3)
Chloride: 98 mmol/L (ref 98–111)
Creatinine, Ser: 0.92 mg/dL (ref 0.61–1.24)
GFR, Estimated: >60 mL/min (ref >60)
Glucose, Bld: 106 mg/dL — ABNORMAL HIGH (ref 70–99)
Potassium: 4 mmol/L (ref 3.5–5.1)
Sodium: 135 mmol/L (ref 135–145)
Total Bilirubin: 1 mg/dL (ref 0.3–1.2)
Total Protein: 6.7 g/dL (ref 6.5–8.1)

## 2021-04-08 LAB — CBC WITH DIFFERENTIAL/PLATELET
Abs Immature Granulocytes: 0.03 10*3/uL (ref 0.00–0.07)
Basophils Absolute: 0 10*3/uL (ref 0.0–0.1)
Basophils Relative: 0 %
Eosinophils Absolute: 0.1 10*3/uL (ref 0.0–0.5)
Eosinophils Relative: 1 %
HCT: 39.9 % (ref 39.0–52.0)
Hemoglobin: 13.4 g/dL (ref 13.0–17.0)
Immature Granulocytes: 0 %
Lymphocytes Relative: 67 %
Lymphs Abs: 6.2 10*3/uL — ABNORMAL HIGH (ref 0.7–4.0)
MCH: 31.3 pg (ref 26.0–34.0)
MCHC: 33.6 g/dL (ref 30.0–36.0)
MCV: 93.2 fL (ref 80.0–100.0)
Monocytes Absolute: 0.9 10*3/uL (ref 0.1–1.0)
Monocytes Relative: 10 %
Neutro Abs: 2 10*3/uL (ref 1.7–7.7)
Neutrophils Relative %: 22 %
Platelets: 105 10*3/uL — ABNORMAL LOW (ref 150–400)
RBC: 4.28 MIL/uL (ref 4.22–5.81)
RDW: 13.2 % (ref 11.5–15.5)
Smear Review: NORMAL
WBC: 9.3 10*3/uL (ref 4.0–10.5)
nRBC: 0 % (ref 0.0–0.2)

## 2021-04-08 MED ORDER — SODIUM CHLORIDE 0.9 % IV SOLN
Freq: Once | INTRAVENOUS | Status: DC | PRN
  Filled 2021-04-08: qty 250

## 2021-04-08 MED ORDER — SODIUM CHLORIDE 0.9 % IV SOLN
10.0000 mg | Freq: Once | INTRAVENOUS | Status: AC
  Administered 2021-04-08: 10 mg via INTRAVENOUS
  Filled 2021-04-08: qty 10

## 2021-04-08 MED ORDER — DIPHENHYDRAMINE HCL 25 MG PO CAPS
50.0000 mg | ORAL_CAPSULE | Freq: Once | ORAL | Status: AC
  Administered 2021-04-08: 50 mg via ORAL
  Filled 2021-04-08: qty 2

## 2021-04-08 MED ORDER — HEPARIN SOD (PORK) LOCK FLUSH 100 UNIT/ML IV SOLN
INTRAVENOUS | Status: AC
  Filled 2021-04-08: qty 5

## 2021-04-08 MED ORDER — HEPARIN SOD (PORK) LOCK FLUSH 100 UNIT/ML IV SOLN
500.0000 [IU] | Freq: Once | INTRAVENOUS | Status: AC | PRN
  Administered 2021-04-08: 500 [IU]
  Filled 2021-04-08: qty 5

## 2021-04-08 MED ORDER — SODIUM CHLORIDE 0.9 % IV SOLN
90.0000 mg/m2 | Freq: Once | INTRAVENOUS | Status: AC
  Administered 2021-04-08: 225 mg via INTRAVENOUS
  Filled 2021-04-08: qty 9

## 2021-04-08 MED ORDER — METHYLPREDNISOLONE SODIUM SUCC 125 MG IJ SOLR
125.0000 mg | Freq: Once | INTRAMUSCULAR | Status: AC | PRN
  Administered 2021-04-08: 125 mg via INTRAVENOUS

## 2021-04-08 MED ORDER — DIPHENHYDRAMINE HCL 50 MG/ML IJ SOLN
50.0000 mg | Freq: Once | INTRAMUSCULAR | Status: AC | PRN
  Administered 2021-04-08: 25 mg via INTRAVENOUS

## 2021-04-08 MED ORDER — ACETAMINOPHEN 325 MG PO TABS
650.0000 mg | ORAL_TABLET | Freq: Once | ORAL | Status: AC
  Administered 2021-04-08: 650 mg via ORAL
  Filled 2021-04-08: qty 2

## 2021-04-08 MED ORDER — PROCHLORPERAZINE EDISYLATE 10 MG/2ML IJ SOLN
10.0000 mg | Freq: Once | INTRAMUSCULAR | Status: AC
  Administered 2021-04-08: 10 mg via INTRAVENOUS

## 2021-04-08 MED ORDER — SODIUM CHLORIDE 0.9 % IV SOLN
375.0000 mg/m2 | Freq: Once | INTRAVENOUS | Status: AC
  Administered 2021-04-08: 900 mg via INTRAVENOUS
  Filled 2021-04-08: qty 50

## 2021-04-08 MED ORDER — SODIUM CHLORIDE 0.9 % IV SOLN
375.0000 mg/m2 | Freq: Once | INTRAVENOUS | Status: DC
  Filled 2021-04-08: qty 90

## 2021-04-08 MED ORDER — FAMOTIDINE 20 MG IN NS 100 ML IVPB
20.0000 mg | Freq: Once | INTRAVENOUS | Status: AC | PRN
  Administered 2021-04-08: 20 mg via INTRAVENOUS
  Filled 2021-04-08: qty 100

## 2021-04-08 MED ORDER — PALONOSETRON HCL INJECTION 0.25 MG/5ML
0.2500 mg | Freq: Once | INTRAVENOUS | Status: AC
  Administered 2021-04-08: 0.25 mg via INTRAVENOUS
  Filled 2021-04-08: qty 5

## 2021-04-08 MED ORDER — SODIUM CHLORIDE 0.9 % IV SOLN
Freq: Once | INTRAVENOUS | Status: AC
  Filled 2021-04-08: qty 250

## 2021-04-08 NOTE — Addendum Note (Signed)
Addended by: Luella Cook on: 04/08/2021 09:42 AM   Modules accepted: Orders

## 2021-04-08 NOTE — Progress Notes (Signed)
Hematology/Oncology Consult note [facility]  [phone] [phone]  Patient Care Team: Jerrol Banana., MD as PCP - General (Family Medicine) Kate Sable, MD as PCP - Cardiology (Cardiology) Sindy Guadeloupe, MD as Consulting Physician (Oncology)   Name of the patient: Russell Simpson  [id number]  1961-05-04   Date of visit: 04/08/21  Diagnosis- stage IV low-grade follicular lymphoma  Chief complaint/ Reason for visit-on treatment assessment prior to cycle 1 of Bendamustine Rituxan chemotherapy  Heme/Onc history: patient is a 60 year old male with a past medical history significant for hypertension, hyperlipidemia, hypogonadism, pituitary adenoma who recently had a fall on in December 2020.[name] He sustained a left anterior frontal sinus as well as supraorbital rim fracture on 11/21/2019 which was managed conservatively. He then presented to the ER at [facility] with symptoms of right-sided chest wall pain this led to a CT PE which did not show any evidence of pulmonary embolism. He was noted to have bilateral symmetric axillary adenopathy measuring up to 1.8 cm. Scattered mediastinal adenopathy. Right paratracheal node measuring up to 1.2 cm. Prominent right costophrenic lymph node measuring up to 1 cm. Findings are nonspecific but could be seen with lymphoma versus systemic inflammatory disease such as sarcoidosis. Of note patient has had a prior CT scan for lung screening back in 2015 when he was not noted to have this adenopathy.  Repeat CT chest in August 2021 showed persistent mild nonbulky axillary and mediastinal adenopathy which had not changed significantly as compared to his prior CT in December 2020. Core biopsy of the axillary lymph nodes was consistent with low-grade follicular lymphoma grade 1 to2.  Repeat CT in March 2022 showed Progression and multistation adenopathy as well as significant splenomegaly measuring 20.4  cm as compared to 14.5 cm on CT scan September 2021.  CT scan showed multistation adenopathy with SUVs ranging between 6-8.9.  Patient underwent another core biopsy of right inguinal lymph node which was consistent with follicular lymphoma grade 1-2.  Bone marrow biopsy also showed moderate involvement with non-Hodgkin's B-cell lymphoma.  Baseline hepatitis B testing showed core antibody positivity.  Seen by GI and started on tenofovir  Interval history-reports that he has cut down his alcohol intake and drinks about 2 beers a day.  Smokes 4-6 5 cigars a day.  ECOG PS- 1 Pain scale- 0  Review of systems- Review of Systems  Constitutional: Negative for chills, fever, malaise/fatigue and weight loss.  HENT: Negative for congestion, ear discharge and nosebleeds.   Eyes: Negative for blurred vision.  Respiratory: Negative for cough, hemoptysis, sputum production, shortness of breath and wheezing.   Cardiovascular: Negative for chest pain, palpitations, orthopnea and claudication.  Gastrointestinal: Negative for abdominal pain, blood in stool, constipation, diarrhea, heartburn, melena, nausea and vomiting.  Genitourinary: Negative for dysuria, flank pain, frequency, hematuria and urgency.  Musculoskeletal: Negative for back pain, joint pain and myalgias.  Skin: Negative for rash.  Neurological: Negative for dizziness, tingling, focal weakness, seizures, weakness and headaches.  Endo/Heme/Allergies: Does not bruise/bleed easily.  Psychiatric/Behavioral: Negative for depression and suicidal ideas. The patient does not have insomnia.       Allergies  Allergen Reactions  . Penicillins Anaphylaxis  . Tetanus Toxoids Swelling  . Lisinopril Cough  . Losartan      Other reaction(s): Muscle Pain    . Tetanus Toxoid   . Codeine Nausea Only and Nausea And Vomiting  . Doxycycline Rash     Past Medical History:  Diagnosis Date  . 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 Arthritis   . Asthma    . Basal cell carcinoma 01/28/2021   R upper arm, ED[facility] 03/04/2021  . Brain tumor (benign) ([place])    benign pituitary neoplasm  . Chronic pain    right arm  . Depression   . Environmental and seasonal allergies   . Hypertension   . Pneumonia   . Sleep apnea    does not wear CPAP ; uses humidifier instead  . Squamous cell carcinoma of skin 02/19/2021   L inferior mandible, treated with [facility]      Past Surgical History:  Procedure Laterality Date  . ANTERIOR CERVICAL DECOMP/DISCECTOMY FUSION N/A 06/17/2016   Procedure: ANTERIOR CERVICAL DECOMPRESSION FUSION, CERVICAL 3-4, CERVICAL 4-5 WITH INSTRUMENTATION AND ALLOGRAFT;  Surgeon: Phylliss Bob, MD;  Location: [facility];  Service: Orthopedics;  Laterality: N/A;  ANTERIOR CERVICAL DECOMPRESSION FUSION, CERVICAL 3-4, CERVICAL 4-5 WITH INSTRUMENTATION AND ALLOGRAFT  . BACK SURGERY    . NECK SURGERY  2009  . PORTA CATH INSERTION N/A 04/03/2021   Procedure: PORTA CATH INSERTION;  Surgeon: Algernon Huxley, MD;  Location: [place] [facility];  Service: Cardiovascular;  Laterality: N/A;    Social History   Socioeconomic History  . Marital status: Divorced    Spouse name: Not on file  . Number of children: Not on file  . Years of education: Not on file  . Highest education level: Not on file  Occupational History  . Not on file  Tobacco Use  . Smoking status: Current Every Day Smoker    Years: 20.00    Types: Cigars  . Smokeless tobacco: Never Used  . Tobacco comment: about 15 cigars/evening  Vaping Use  . Vaping Use: Former  . Start date: 04/30/2017  . Quit date: 11/30/2017  Substance and Sexual Activity  . Alcohol use: Not Currently    Alcohol/week: 0.0 standard drinks  . Drug use: No  . Sexual activity: Not Currently  Other Topics Concern  . Not on file  Social History Narrative  . Not on file   Social Determinants of Health   Financial Resource Strain: Not on file  Food Insecurity: Not on file  Transportation Needs: Not on file   Physical Activity: Not on file  Stress: Not on file  Social Connections: Not on file  Intimate Partner Violence: Not on file    Family History  Problem Relation Age of Onset  . Diabetes Mother   . Diabetes Other      Current Outpatient Medications:  .  acyclovir (ZOVIRAX) 400 MG tablet, Take 1 tablet (400 mg total) by mouth 2 (two) times daily., Disp: 60 tablet, Rfl: 2 .  acyclovir (ZOVIRAX) 400 MG tablet, Take 1 tablet (400 mg total) by mouth daily., Disp: 30 tablet, Rfl: 3 .  allopurinol (ZYLOPRIM) 300 MG tablet, Take 1 tablet (300 mg total) by mouth daily., Disp: 30 tablet, Rfl: 3 .  amLODipine (NORVASC) 5 MG tablet, Take 1 tablet (5 mg total) by mouth daily., Disp: 90 tablet, Rfl: 0 .  aspirin EC 81 MG tablet, Take 81 mg by mouth daily. Swallow whole., Disp: , Rfl:  .  atorvastatin (LIPITOR) 40 MG tablet, Take 1 tablet (40 mg total) by mouth daily., Disp: 90 tablet, Rfl: 4 .  Budeson-Glycopyrrol-Formoterol (BREZTRI AEROSPHERE) 160-9-4.8 MCG/ACT AERO, Inhale 2 puffs into the lungs 2 (two) times daily. (Patient not taking: No sig reported), Disp: 5.9 g, Rfl: 0 .  clonazePAM (  KLONOPIN) 0.5 MG tablet, Take 0.5 mg by mouth daily., Disp: , Rfl:  .  dexamethasone (DECADRON) 4 MG tablet, Take 2 tablets (8 mg total) by mouth daily. Start the day after bendamustine chemotherapy for 2 days. Take with food., Disp: 30 tablet, Rfl: 1 .  DULoxetine (CYMBALTA) 60 MG capsule, Take 1 capsule (60 mg total) by mouth daily., Disp: 90 capsule, Rfl: 0 .  EUCRISA 2 % OINT, Apply  a small amount to affected area   qd/bid to aa's chest, inframammary, eye, Disp: , Rfl:  .  famotidine (PEPCID) 20 MG tablet, Take 1 tablet (20 mg total) by mouth 2 (two) times daily., Disp: 180 tablet, Rfl: 0 .  finasteride (PROSCAR) 5 MG tablet, TAKE 1 TABLET BY MOUTH ONCE DAILY, Disp: 90 tablet, Rfl: 2 .  lidocaine-prilocaine (EMLA) cream, Apply to affected area once, Disp: 30 g, Rfl: 3 .  loratadine (CLARITIN) 10 MG tablet,  Take 10 mg by mouth daily. , Disp: , Rfl:  .  metoprolol succinate (TOPROL-XL) 25 MG 24 hr tablet, Take 1 tablet (25 mg total) by mouth daily., Disp: 90 tablet, Rfl: 1 .  modafinil (PROVIGIL) 200 MG tablet, Take 200 mg by mouth daily., Disp: , Rfl:  .  montelukast (SINGULAIR) 10 MG tablet, Take 1 tablet (10 mg total) by mouth at bedtime., Disp: 90 tablet, Rfl: 1 .  mupirocin ointment (BACTROBAN) 2 %, Apply 1 application topically 2 (two) times daily., Disp: 22 g, Rfl: 0 .  naltrexone (DEPADE) 50 MG tablet, , Disp: , Rfl:  .  OLANZapine (ZYPREXA) 7.5 MG tablet, Take 7.5 mg by mouth at bedtime., Disp: , Rfl:  .  omeprazole (PRILOSEC) 20 MG capsule, Take 1 capsule (20 mg total) by mouth 2 (two) times daily before a meal., Disp: 180 capsule, Rfl: 0 .  ondansetron (ZOFRAN) 8 MG tablet, Take 1 tablet (8 mg total) by mouth 2 (two) times daily as needed for refractory nausea / vomiting. Start on day 2 after bendamustine chemo., Disp: 30 tablet, Rfl: 1 .  prochlorperazine (COMPAZINE) 10 MG tablet, Take 1 tablet (10 mg total) by mouth every 6 (six) hours as needed (Nausea or vomiting)., Disp: 30 tablet, Rfl: 1 .  tacrolimus (PROTOPIC) 0.1 % ointment, Apply 1 application topically 2 (two) times daily., Disp: , Rfl:  .  tadalafil (CIALIS) 20 MG tablet, 1 tab 1 hour prior to intercourse, Disp: 10 tablet, Rfl: 0 .  tenofovir (VIREAD) 300 MG tablet, Take 1 tablet (300 mg total) by mouth daily., Disp: 90 tablet, Rfl: 2 .  testosterone cypionate (DEPOTESTOSTERONE CYPIONATE) 200 MG/ML injection, Inject 1 mL (200 mg total) into the muscle every 14 (fourteen) days., Disp: 10 mL, Rfl: 0 .  triamcinolone (KENALOG) 0.1 %, Apply to affected areas 1-2 times a day until rash improved. Avoid face, groin, underarms., Disp: 80 g, Rfl: 1  Physical exam:  Vitals:   04/08/21 0846  BP: 124/87  Pulse: (!) 112  Resp: 16  Temp: 99.1 F (37.3 C)  TempSrc: Oral  Weight: 234 lb 6.4 oz (106.3 kg)  Height: _0  (1.88 m)    Physical Exam HENT:     Head: Normocephalic and atraumatic.  Eyes:     Pupils: Pupils are equal, round, and reactive to light.  Cardiovascular:     Rate and Rhythm: Regular rhythm. Tachycardia present.     Heart sounds: Normal heart sounds.  Pulmonary:     Effort: Pulmonary effort is normal.     Breath sounds:  Normal breath sounds.  Abdominal:     General: Bowel sounds are normal.     Palpations: Abdomen is soft.     Comments: Palpable splenomegaly 2 cm below the subcostal margin  Musculoskeletal:     Cervical back: Normal range of motion.  Skin:    General: Skin is warm and dry.  Neurological:     Mental Status: He is alert and oriented to person, place, and time.      CMP Latest Ref Rng & Units 02/27/2021  Glucose 70 - 99 mg/dL 112(H)  BUN 6 - 20 mg/dL 12  Creatinine 0.61 - 1.24 mg/dL 0.91  Sodium 135 - 145 mmol/L 136  Potassium 3.5 - 5.1 mmol/L 4.3  Chloride 98 - 111 mmol/L 98  CO2 22 - 32 mmol/L 24  Calcium 8.9 - 10.3 mg/dL 8.9  Total Protein 6.5 - 8.1 g/dL 6.9  Total Bilirubin 0.3 - 1.2 mg/dL 0.8  Alkaline Phos 38 - 126 U/L 140(H)  AST 15 - 41 U/L 28  ALT 0 - 44 U/L 22   CBC Latest Ref Rng & Units 04/08/2021  WBC 4.0 - 10.5 K/uL 9.3  Hemoglobin 13.0 - 17.0 g/dL 13.4  Hematocrit 39.0 - 52.0 % 39.9  Platelets 150 - 400 K/uL 105(L)    No images are attached to the encounter.  PERIPHERAL VASCULAR CATHETERIZATION  Result Date: 04/03/2021 See op note  NM PET Image Initial (PI) Skull Base To Thigh  Addendum Date: 04/01/2021   ADDENDUM REPORT: 04/01/2021 12:29 ADDENDUM: In reviewing the pathology for this case, an error was noted in this report. Under the abdomen pelvis section, the last line should read "., and multiple hypermetabolic inguinal lymph nodes bilaterally (up to 8.0 SUV max on the left). This node measures 2.1 cm short axis" (not 8.0 cm). Electronically Signed   By: Richardean Sale M.D.   On: 04/01/2021 12:29   Result Date: 04/01/2021 CLINICAL DATA:   Initial treatment strategy for follicular lymphoma. EXAM: NUCLEAR MEDICINE PET SKULL BASE TO THIGH TECHNIQUE: 12.83 mCi F-18 FDG was injected intravenously. Full-ring PET imaging was performed from the skull base to thigh after the radiotracer. CT data was obtained and used for attenuation correction and anatomic localization. Fasting blood glucose: 122 mg/dl COMPARISON:  CT chest, abdomen and pelvis 02/25/2021 and abdominopelvic CT 08/14/2020 FINDINGS: Mediastinal blood pool activity: SUV max 1.8 Liver activity: SUV max 3.4 NECK: There are multiple mildly enlarged hypermetabolic cervical lymph nodes bilaterally within stations 2 through 4. Representative nodes include level 2 nodes within SUV max of 7.2 on the right and 5.5 on the left.There is no suspicious activity within the pharyngeal mucosal space. Activity anteriorly in the oral cavity at the tip of the tongue is likely physiologic. Incidental CT findings: Bilateral carotid atherosclerosis. CHEST: There are multiple hypermetabolic mediastinal, hilar and axillary lymph nodes bilaterally. Representative nodes include a 1.2 cm right paratracheal node on image 88/3 (SUV max 6.2), a 1.9 cm right axillary node (SUV max 6.8) and a 1.6 cm left axillary node (SUV max 7.3). No hypermetabolic pulmonary activity or suspicious pulmonary nodularity. Incidental CT findings: Atherosclerosis of the aorta, great vessels and coronary arteries. ABDOMEN/PELVIS: The spleen is moderately enlarged with hypermetabolic activity (SUV max 6.0). No focal hypermetabolic activity is present within the liver, adrenal glands or pancreas. There are multiple enlarged and hypermetabolic abdominopelvic lymph nodes. Representative nodes include a 2.2 cm aortocaval node on image 205/3 (SUV max 8.9), external iliac nodes on image 257/3 measuring 2.2 cm  on the right (SUV max 10.3) and 2.5 cm on the left (SUV max 10.9), and multiple hypermetabolic inguinal lymph nodes bilaterally (up to 8.0 cm on the  left). Incidental CT findings: The hepatic density is diffusely decreased, consistent with steatosis. Mild aortic and branch vessel atherosclerosis. Mild sigmoid diverticulosis. SKELETON: Low level metabolic activity is seen throughout the bones, nonspecific. No focal lesions are identified. Incidental CT findings: none IMPRESSION: 1. Multiple hypermetabolic cervical, thoracic, abdominal and pelvic lymph nodes as described consistent with lymphoma ([place] 5). 2. Moderate splenomegaly with associated moderate hypermetabolic activity. 3. Nonspecific mildly increased metabolic activity throughout the bones without focal lesions. No other solid organ parenchymal involvement identified. Electronically Signed: By: Richardean Sale M.D. On: 03/12/2021 08:22   [place] CORE BIOPSY (LYMPH NODES)  Result Date: 03/19/2021 INDICATION: History of grade 1 follicular lymphoma with prior right axillary lymph node biopsy on 07/25/2020. Imaging evidence by CT and PET scan of probable progression of lymphoma and need for additional lymph node biopsy to assess status of disease. EXAM: ULTRASOUND GUIDED CORE BIOPSY OF RIGHT INGUINAL LYMPH NODE MEDICATIONS: None. ANESTHESIA/SEDATION: Fentanyl 100 mcg IV; Versed 2.0 mg IV Moderate Sedation Time:  20 minutes. The patient was continuously monitored during the procedure by the interventional radiology nurse under my direct supervision. PROCEDURE: The procedure, risks, benefits, and alternatives were explained to the patient. Questions regarding the procedure were encouraged and answered. The patient understands and consents to the procedure. A time-out was performed prior to initiating the procedure. Bilateral inguinal regions were initially scanned by ultrasound to localize lymphadenopathy. The right groin was prepped with chlorhexidine in a sterile fashion, and a sterile drape was applied covering the operative field. A sterile gown and sterile gloves were used for the procedure. Local  anesthesia was provided with 1% Lidocaine. Under ultrasound guidance, 16 gauge core biopsy samples were obtained at the level of an enlarged inferior right inguinal lymph node. Core biopsy samples were submitted on saline soaked Telfa gauze to Pathology. COMPLICATIONS: None immediate. FINDINGS: Multiple enlarged bilateral inguinal lymph nodes are identified. The most accessible in the right groin was a superficial lymph node located in the inferior inguinal regions/upper medial thigh. This lymph node measures approximately 3.8 x 1.4 x 2.6 cm. Solid core biopsy samples were obtained from different areas of the cortex of the lymph node. IMPRESSION: Ultrasound-guided core biopsy performed of an enlarged right inguinal lymph node. Electronically Signed   By: Aletta Edouard M.D.   On: 03/19/2021 11:54     Assessment and plan- Patient is a 60 y.o. male with stage IV follicular lymphoma and progressive splenomegaly and multistation adenopathy here for on treatment assessment prior to cycle 1 of Bendamustine Rituxan chemotherapy  Patient has mild baseline thrombocytopenia likely secondary to splenomegaly.  No evidence of chronic liver disease based on recent labs and imaging.  Platelet counts likely to improve once splenomegaly improves with treatment.  Counts okay to proceed with cycle 1 of Bendamustine Rituxan chemotherapy today.  Patient will also receive Bendamustine tomorrow.  I will see him back in 10 days to see how he tolerated treatment and he will be seen by covering MD/NP in 4 weeks for cycle 2.  Plan is to complete 6 cycles  Patient has already received 1 dose of EvuShield on 03/27/2021.  Patient has positive core antibody hepatitis B and was seen by GI.  He is on tenofovir prophylaxis while on Rituxan  Patient is on acyclovir prophylaxis as well as allopurinol.   Visit  Diagnosis 1. Encounter for monitoring rituximab therapy   2. Encounter for antineoplastic chemotherapy   3. Thrombocytopenia  (HCC)   4. Splenomegaly   5. 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 Randa Evens, MD, MPH [facility] [phone] 04/08/2021 8:40 AM

## 2021-04-08 NOTE — Patient Instructions (Addendum)
[facility]  Discharge Instructions: Thank you for choosing [facility] to provide your oncology and hematology care.  If you have a lab appointment with the [facility], please go directly to the [facility] and check in at the registration area.  Wear comfortable clothing and clothing appropriate for easy access to any Portacath or PICC line.   We strive to give you quality time with your provider. You may need to reschedule your appointment if you arrive late (15 or more minutes).  Arriving late affects you and other patients whose appointments are after yours.  Also, if you miss three or more appointments without notifying the office, you may be dismissed from the clinic at the provider's discretion.      For prescription refill requests, have your pharmacy contact our office and allow 72 hours for refills to be completed.    Today you received the following chemotherapy and/or immunotherapy agents Ruxience, Bendamustine (Bendeka)      To help prevent nausea and vomiting after your treatment, we encourage you to take your nausea medication as directed.  BELOW ARE SYMPTOMS THAT SHOULD BE REPORTED IMMEDIATELY: . *FEVER GREATER THAN 100.4 F (38 C) OR HIGHER . *CHILLS OR SWEATING . *NAUSEA AND VOMITING THAT IS NOT CONTROLLED WITH YOUR NAUSEA MEDICATION . *UNUSUAL SHORTNESS OF BREATH . *UNUSUAL BRUISING OR BLEEDING . *URINARY PROBLEMS (pain or burning when urinating, or frequent urination) . *BOWEL PROBLEMS (unusual diarrhea, constipation, pain near the anus) . TENDERNESS IN MOUTH AND THROAT WITH OR WITHOUT PRESENCE OF ULCERS (sore throat, sores in mouth, or a toothache) . UNUSUAL RASH, SWELLING OR PAIN  . UNUSUAL VAGINAL DISCHARGE OR ITCHING   Items with * indicate a potential emergency and should be followed up as soon as possible or go to the Emergency Department if any problems should occur.  Please show the CHEMOTHERAPY ALERT  CARD or IMMUNOTHERAPY ALERT CARD at check-in to the Emergency Department and triage nurse.  Should you have questions after your visit or need to cancel or reschedule your appointment, please contact [facility]  [phone] and follow the prompts.  Office hours are 8:00 a.m. to 4:30 p.m. Monday - Friday. Please note that voicemails left after 4:00 p.m. may not be returned until the following business day.  We are closed weekends and major holidays. You have access to a nurse at all times for urgent questions. Please call the main number to the clinic [phone] and follow the prompts.  For any non-urgent questions, you may also contact your provider using MyChart. We now offer e-Visits for anyone 75 and older to request care online for non-urgent symptoms. For details visit [email or web address].   Also download the MyChart app! Go to the app store, search "MyChart", open the app, select [facility], and log in with your MyChart username and password.  Due to Covid, a mask is required upon entering the hospital/clinic. If you do not have a mask, one will be given to you upon arrival. For doctor visits, patients may have 1 support person aged 15 or older with them. For treatment visits, patients cannot have anyone with them due to current Covid guidelines and our immunocompromised population.   Bendamustine Injection What is this medicine? BENDAMUSTINE (BEN da MUS teen) is a chemotherapy drug. It is used to treat chronic lymphocytic leukemia and non-Hodgkin lymphoma. This medicine may be used for other purposes; ask your health care provider or pharmacist if you  have questions. COMMON BRAND NAME(S): Kristine Royal, Treanda What should I tell my health care provider before I take this medicine? They need to know if you have any of these conditions:  infection (especially a virus infection such as chickenpox, cold sores, or herpes)  kidney  disease  liver disease  an unusual or allergic reaction to bendamustine, mannitol, other medicines, foods, dyes, or preservatives  pregnant or trying to get pregnant  breast-feeding How should I use this medicine? This medicine is for infusion into a vein. It is given by a health care professional in a hospital or clinic setting. Talk to your pediatrician regarding the use of this medicine in children. Special care may be needed. Overdosage: If you think you have taken too much of this medicine contact a poison control center or emergency room at once. NOTE: This medicine is only for you. Do not share this medicine with others. What if I miss a dose? It is important not to miss your dose. Call your doctor or health care professional if you are unable to keep an appointment. What may interact with this medicine? Do not take this medicine with any of the following medications:  clozapine This medicine may also interact with the following medications:  atazanavir  cimetidine  ciprofloxacin  enoxacin  fluvoxamine  medicines for seizures like carbamazepine and phenobarbital  mexiletine  rifampin  tacrine  thiabendazole  zileuton This list may not describe all possible interactions. Give your health care provider a list of all the medicines, herbs, non-prescription drugs, or dietary supplements you use. Also tell them if you smoke, drink alcohol, or use illegal drugs. Some items may interact with your medicine. What should I watch for while using this medicine? This drug may make you feel generally unwell. This is not uncommon, as chemotherapy can affect healthy cells as well as cancer cells. Report any side effects. Continue your course of treatment even though you feel ill unless your doctor tells you to stop. You may need blood work done while you are taking this medicine. Call your doctor or healthcare provider for advice if you get a fever, chills or sore throat, or  other symptoms of a cold or flu. Do not treat yourself. This drug decreases your body's ability to fight infections. Try to avoid being around people who are sick. This medicine may cause serious skin reactions. They can happen weeks to months after starting the medicine. Contact your healthcare provider right away if you notice fevers or flu-like symptoms with a rash. The rash may be red or purple and then turn into blisters or peeling of the skin. Or, you might notice a red rash with swelling of the face, lips or lymph nodes in your neck or under your arms. In some patients, this medicine may cause a serious brain infection that may cause death. If you have any problems seeing, thinking, speaking, walking, or standing, tell your health care provider right away. If you cannot reach your health care provider, urgently seek other source of medical care. This medicine may increase your risk to bruise or bleed. Call your doctor or healthcare provider if you notice any unusual bleeding. Talk to your doctor about your risk of cancer. You may be more at risk for certain types of cancers if you take this medicine. This medicine may increase your risk of skin cancer. Check your skin for changes to moles or for new growths while taking this medicine. Call your health care provider if you  notice any of these skin changes. Do not become pregnant while taking this medicine or for at least 6 months after stopping it. Women should inform their doctor if they wish to become pregnant or think they might be pregnant. Men should not father a child while taking this medicine and for at least 3 months after stopping it. There is a potential for serious side effects to an unborn child. Talk to your healthcare provider or pharmacist for more information. Do not breast-feed an infant while taking this medicine or for at least 1 week after stopping it. This medicine may make it more difficult to father a child. You should talk with  your doctor or healthcare provider if you are concerned about your fertility. What side effects may I notice from receiving this medicine? Side effects that you should report to your doctor or health care professional as soon as possible:  allergic reactions like skin rash, itching or hives, swelling of the face, lips, or tongue  low blood counts - this medicine may decrease the number of white blood cells, red blood cells and platelets. You may be at increased risk for infections and bleeding.  rash, fever, and swollen lymph nodes  redness, blistering, peeling, or loosening of the skin, including inside the mouth  signs of infection like fever or chills, cough, sore throat, pain or difficulty passing urine  signs of decreased platelets or bleeding like bruising, pinpoint red spots on the skin, black, tarry stools, blood in the urine  signs of decreased red blood cells like being unusually weak or tired, fainting spells, lightheadedness  signs and symptoms of kidney injury like trouble passing urine or change in the amount of urine  signs and symptoms of liver injury like dark yellow or brown urine; general ill feeling or flu-like symptoms; light-colored stools; loss of appetite; nausea; right upper belly pain; unusually weak or tired; yellowing of the eyes or skin Side effects that usually do not require medical attention (report to your doctor or health care professional if they continue or are bothersome):  constipation  decreased appetite  diarrhea  headache  mouth sores  nausea, vomiting  tiredness This list may not describe all possible side effects. Call your doctor for medical advice about side effects. You may report side effects to FDA at 1-800-FDA-1088. Where should I keep my medicine? This drug is given in a hospital or clinic and will not be stored at home. NOTE: This sheet is a summary. It may not cover all possible information. If you have questions about this  medicine, talk to your doctor, pharmacist, or health care provider.  2021 Elsevier/Gold Standard (2020-05-13 12:11:43) Rituximab Injection What is this medicine? RITUXIMAB (ri TUX i mab) is a monoclonal antibody. It is used to treat certain types of cancer like non-Hodgkin lymphoma and chronic lymphocytic leukemia. It is also used to treat rheumatoid arthritis, granulomatosis with polyangiitis, microscopic polyangiitis, and pemphigus vulgaris. This medicine may be used for other purposes; ask your health care provider or pharmacist if you have questions. COMMON BRAND NAME(S): RIABNI, Rituxan, RUXIENCE What should I tell my health care provider before I take this medicine? They need to know if you have any of these conditions:  chest pain  heart disease  infection especially a viral infection such as chickenpox, cold sores, hepatitis B, or herpes  immune system problems  irregular heartbeat or rhythm  kidney disease  low blood counts (white cells, platelets, or red cells)  lung disease  recent  or upcoming vaccine  an unusual or allergic reaction to rituximab, other medicines, foods, dyes, or preservatives  pregnant or trying to get pregnant  breast-feeding How should I use this medicine? This medicine is injected into a vein. It is given by a health care provider in a hospital or clinic setting. A special MedGuide will be given to you before each treatment. Be sure to read this information carefully each time. Talk to your health care provider about the use of this medicine in children. While this drug may be prescribed for children as young as 2 years for selected conditions, precautions do apply. Overdosage: If you think you have taken too much of this medicine contact a poison control center or emergency room at once. NOTE: This medicine is only for you. Do not share this medicine with others. What if I miss a dose? Keep appointments for follow-up doses. It is important not  to miss your dose. Call your health care provider if you are unable to keep an appointment. What may interact with this medicine? Do not take this medicine with any of the following medicines:  live vaccines This medicine may also interact with the following medicines:  cisplatin This list may not describe all possible interactions. Give your health care provider a list of all the medicines, herbs, non-prescription drugs, or dietary supplements you use. Also tell them if you smoke, drink alcohol, or use illegal drugs. Some items may interact with your medicine. What should I watch for while using this medicine? Your condition will be monitored carefully while you are receiving this medicine. You may need blood work done while you are taking this medicine. This medicine can cause serious infusion reactions. To reduce the risk your health care provider may give you other medicines to take before receiving this one. Be sure to follow the directions from your health care provider. This medicine may increase your risk of getting an infection. Call your health care provider for advice if you get a fever, chills, sore throat, or other symptoms of a cold or flu. Do not treat yourself. Try to avoid being around people who are sick. Call your health care provider if you are around anyone with measles, chickenpox, or if you develop sores or blisters that do not heal properly. Avoid taking medicines that contain aspirin, acetaminophen, ibuprofen, naproxen, or ketoprofen unless instructed by your health care provider. These medicines may hide a fever. This medicine may cause serious skin reactions. They can happen weeks to months after starting the medicine. Contact your health care provider right away if you notice fevers or flu-like symptoms with a rash. The rash may be red or purple and then turn into blisters or peeling of the skin. Or, you might notice a red rash with swelling of the face, lips or lymph nodes  in your neck or under your arms. In some patients, this medicine may cause a serious brain infection that may cause death. If you have any problems seeing, thinking, speaking, walking, or standing, tell your healthcare professional right away. If you cannot reach your healthcare professional, urgently seek other source of medical care. Do not become pregnant while taking this medicine or for at least 12 months after stopping it. Women should inform their health care provider if they wish to become pregnant or think they might be pregnant. There is potential for serious harm to an unborn child. Talk to your health care provider for more information. Women should use a reliable form of  birth control while taking this medicine and for 12 months after stopping it. Do not breast-feed while taking this medicine or for at least 6 months after stopping it. What side effects may I notice from receiving this medicine? Side effects that you should report to your health care provider as soon as possible:  allergic reactions (skin rash, itching or hives; swelling of the face, lips, or tongue)  diarrhea  edema (sudden weight gain; swelling of the ankles, feet, hands or other unusual swelling; trouble breathing)  fast, irregular heartbeat  heart attack (trouble breathing; pain or tightness in the chest, neck, back or arms; unusually weak or tired)  infection (fever, chills, cough, sore throat, pain or trouble passing urine)  kidney injury (trouble passing urine or change in the amount of urine)  liver injury (dark yellow or brown urine; general ill feeling or flu-like symptoms; loss of appetite, right upper belly pain; unusually weak or tired, yellowing of the eyes or skin)  low blood pressure (dizziness; feeling faint or lightheaded, falls; unusually weak or tired)  low red blood cell counts (trouble breathing; feeling faint; lightheaded, falls; unusually weak or tired)  mouth sores  redness,  blistering, peeling, or loosening of the skin, including inside the mouth  stomach pain  unusual bruising or bleeding  wheezing (trouble breathing with loud or whistling sounds)  vomiting Side effects that usually do not require medical attention (report to your health care provider if they continue or are bothersome):  headache  joint pain  muscle cramps, pain  nausea This list may not describe all possible side effects. Call your doctor for medical advice about side effects. You may report side effects to FDA at 1-800-FDA-1088. Where should I keep my medicine? This medicine is given in a hospital or clinic. It will not be stored at home. NOTE: This sheet is a summary. It may not cover all possible information. If you have questions about this medicine, talk to your doctor, pharmacist, or health care provider.  2021 Elsevier/Gold Standard (2020-08-29 21:35:50)

## 2021-04-08 NOTE — Progress Notes (Signed)
HR 112. Per Dr Janese Banks ok to proceed with treatment today.  Patient complaining of sweating at 1100, 3 minutes before initial rate of Ruxience complete. Paused, VS checked and were within normal limits...restarted and patient immediately began complaining of nausea, feeling clammy, and short of breath. Ruxience stopped at 1103. O2 applied at 2L via nasal cannula. Benadryl, Solumedrol, and Pepcid given per protocol. Dr Janese Banks chairside at [phone]. O2 increased to 3L-sats improved to 96%. Compazine 10mg  IV ordered for nausea and given at 1114. 1115 patient states he feels cooler. Continued oxygen until 1144 when patient able to maintain sats at 96% on room air. 1200 all symptoms resolved, patient resting in chair.   [place] restarted. Will continue to monitor  1630-Ruxience finished. Patient tolerated well.

## 2021-04-08 NOTE — Progress Notes (Signed)
Pt new start for chemo for lymphoma. Also pt is having sinus issues from pollen he says

## 2021-04-08 NOTE — Progress Notes (Signed)
HR 112 ok to proceed per MD 

## 2021-04-09 ENCOUNTER — Inpatient Hospital Stay: Payer: Medicare HMO

## 2021-04-09 ENCOUNTER — Other Ambulatory Visit: Payer: Self-pay | Admitting: *Deleted

## 2021-04-09 ENCOUNTER — Telehealth: Payer: Self-pay

## 2021-04-09 ENCOUNTER — Encounter: Payer: Self-pay | Admitting: Oncology

## 2021-04-09 ENCOUNTER — Other Ambulatory Visit: Payer: Self-pay

## 2021-04-09 ENCOUNTER — Telehealth: Payer: Self-pay | Admitting: *Deleted

## 2021-04-09 VITALS — BP 143/86 | HR 98 | Temp 98.0°F | Resp 19

## 2021-04-09 DIAGNOSIS — D352 Benign neoplasm of pituitary gland: Secondary | ICD-10-CM | POA: Diagnosis not present

## 2021-04-09 DIAGNOSIS — Z5111 Encounter for antineoplastic chemotherapy: Secondary | ICD-10-CM | POA: Diagnosis not present

## 2021-04-09 DIAGNOSIS — C8202 Follicular lymphoma grade I, intrathoracic lymph nodes: Secondary | ICD-10-CM

## 2021-04-09 DIAGNOSIS — D696 Thrombocytopenia, unspecified: Secondary | ICD-10-CM | POA: Diagnosis not present

## 2021-04-09 DIAGNOSIS — E785 Hyperlipidemia, unspecified: Secondary | ICD-10-CM | POA: Diagnosis not present

## 2021-04-09 DIAGNOSIS — I10 Essential (primary) hypertension: Secondary | ICD-10-CM | POA: Diagnosis not present

## 2021-04-09 DIAGNOSIS — B1910 Unspecified viral hepatitis B without hepatic coma: Secondary | ICD-10-CM | POA: Diagnosis not present

## 2021-04-09 DIAGNOSIS — E291 Testicular hypofunction: Secondary | ICD-10-CM | POA: Diagnosis not present

## 2021-04-09 DIAGNOSIS — C8208 Follicular lymphoma grade I, lymph nodes of multiple sites: Secondary | ICD-10-CM | POA: Diagnosis not present

## 2021-04-09 DIAGNOSIS — R161 Splenomegaly, not elsewhere classified: Secondary | ICD-10-CM | POA: Diagnosis not present

## 2021-04-09 MED ORDER — SODIUM CHLORIDE 0.9 % IV SOLN
10.0000 mg | Freq: Once | INTRAVENOUS | Status: AC
  Administered 2021-04-09: 10 mg via INTRAVENOUS
  Filled 2021-04-09: qty 10

## 2021-04-09 MED ORDER — HEPARIN SOD (PORK) LOCK FLUSH 100 UNIT/ML IV SOLN
500.0000 [IU] | Freq: Once | INTRAVENOUS | Status: AC | PRN
  Administered 2021-04-09: 500 [IU]
  Filled 2021-04-09: qty 5

## 2021-04-09 MED ORDER — BENZONATATE 200 MG PO CAPS: 200.0000 mg | ORAL_CAPSULE | Freq: Three times a day (TID) | ORAL | 0 refills | Status: DC | PRN

## 2021-04-09 MED ORDER — HEPARIN SOD (PORK) LOCK FLUSH 100 UNIT/ML IV SOLN
INTRAVENOUS | Status: AC
  Filled 2021-04-09: qty 5

## 2021-04-09 MED ORDER — SODIUM CHLORIDE 0.9 % IV SOLN
Freq: Once | INTRAVENOUS | Status: AC
  Filled 2021-04-09: qty 250

## 2021-04-09 MED ORDER — SODIUM CHLORIDE 0.9 % IV SOLN
90.0000 mg/m2 | Freq: Once | INTRAVENOUS | Status: AC
  Administered 2021-04-09: 225 mg via INTRAVENOUS
  Filled 2021-04-09: qty 9

## 2021-04-09 NOTE — Telephone Encounter (Signed)
Telephone call to patient for follow up after receiving first infusion.   Patient states infusion went great.  States eating good and drinking plenty of fluids.   Denies any nausea or vomiting.  Encouraged patient to call for any concerns or questions. 

## 2021-04-09 NOTE — Telephone Encounter (Signed)
Called Dr Janese Banks and she said that he could have hydromet. I have ordered it and it will be sent to pharmacy by 5 pm

## 2021-04-09 NOTE — Telephone Encounter (Signed)
Lauren says that pt had coughed a lot yest when he lays down. He would like cough syrup. I originally called hycodan in but he was allergic to it. It says nausea and vomiting with codeine. Then he was switched to tessalon pearls and when I called the pt. He has taken pearls in past and they did not work at all. He wants the hycodan and he will take nausea med before he takes cough med. I checked with Janese Banks and she will send it in when she is able to get back home to computer. Called pt and he is good with this plan. I called the pharmacy and [facility] to [facility] who will cancel tessalon pearls and md to send in hycodan.

## 2021-04-10 ENCOUNTER — Other Ambulatory Visit: Payer: Self-pay | Admitting: Pulmonary Disease

## 2021-04-10 ENCOUNTER — Encounter: Payer: Self-pay | Admitting: Oncology

## 2021-04-10 MED ORDER — HYDROCODONE BIT-HOMATROP MBR 5-1.5 MG/5ML PO SOLN: 5.0000 mL | Freq: Four times a day (QID) | ORAL | 0 refills | Status: DC | PRN

## 2021-04-11 ENCOUNTER — Telehealth: Payer: Self-pay | Admitting: *Deleted

## 2021-04-11 ENCOUNTER — Other Ambulatory Visit: Payer: Self-pay | Admitting: Oncology

## 2021-04-11 ENCOUNTER — Inpatient Hospital Stay (HOSPITAL_BASED_OUTPATIENT_CLINIC_OR_DEPARTMENT_OTHER): Payer: Medicare HMO | Admitting: Nurse Practitioner

## 2021-04-11 ENCOUNTER — Other Ambulatory Visit: Payer: Self-pay

## 2021-04-11 ENCOUNTER — Encounter: Payer: Self-pay | Admitting: Nurse Practitioner

## 2021-04-11 DIAGNOSIS — U071 COVID-19: Secondary | ICD-10-CM

## 2021-04-11 MED ORDER — NIRMATRELVIR/RITONAVIR (PAXLOVID)TABLET
3.0000 | ORAL_TABLET | Freq: Two times a day (BID) | ORAL | 0 refills | Status: AC
  Filled 2021-04-11: qty 30, 5d supply, fill #0

## 2021-04-11 NOTE — Telephone Encounter (Signed)
Russell Simpson- he got evushield. Please see if he qualifies for paxlovid. Cancel 5/20 appt. Keep 6/7 appt as scheduled. Pt to call if he has any concerns

## 2021-04-11 NOTE — Progress Notes (Signed)
Outpatient Oral COVID Treatment Note  I connected with Russell Simpson on 04/11/2021/1:47 PM by telephone and verified that I am speaking with the correct person using two identifiers.  I discussed the limitations, risks, security, and privacy concerns of performing an evaluation and management service by telephone and the availability of in person appointments. I also discussed with the patient that there may be a patient responsible charge related to this service. The patient expressed understanding and agreed to proceed.  Patient location: home Provider location: clinic  Diagnosis: COVID-19 infection  Purpose of visit: Discussion of potential use of Molnupiravir or Paxlovid, a new treatment for mild to moderate COVID-19 viral infection in non-hospitalized patients.   Subjective: Patient is a 60 y.o. male who has been diagnosed with COVID 19 viral infection.  Symptoms began on 04/08/21 with cough. He has cough secondary to lymphoma diagnosis but noticed cough was more intense and productive which was a change. He has had increased fatigue.     Past Medical History:  Diagnosis Date  . 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 Arthritis   . Asthma   . Basal cell carcinoma 01/28/2021   R upper arm, EDC 03/04/2021  . 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 Chronic pain    right arm  . Depression   . Environmental and seasonal allergies   . Follicular lymphoma ([place])   . Hypertension   . Pneumonia   . Sleep apnea    does not wear CPAP ; uses humidifier instead  . Squamous cell carcinoma of skin 02/19/2021   L inferior mandible, treated with EDC     Allergies  Allergen Reactions  . Penicillins Anaphylaxis  . Tetanus Toxoids Swelling  . Lisinopril Cough  . Losartan      Other reaction(s): Muscle Pain    . Tetanus Toxoid   . Codeine Nausea Only and Nausea And Vomiting  . Doxycycline Rash     Current Outpatient Medications:  .  nirmatrelvir/ritonavir EUA, renal  dosing, (PAXLOVID) TABS, Take 2 tablets by mouth 2 (two) times daily for 5 days. Patient GFR is 131 mL/min. Take nirmatrelvir (150 mg) one tablet twice daily for 5 days and ritonavir (100 mg) one tablet twice daily for 5 days., Disp: 20 tablet, Rfl: 0 .  acyclovir (ZOVIRAX) 400 MG tablet, Take 1 tablet (400 mg total) by mouth 2 (two) times daily., Disp: 60 tablet, Rfl: 2 .  allopurinol (ZYLOPRIM) 300 MG tablet, Take 1 tablet (300 mg total) by mouth daily., Disp: 30 tablet, Rfl: 3 .  amLODipine (NORVASC) 5 MG tablet, Take 1 tablet (5 mg total) by mouth daily., Disp: 90 tablet, Rfl: 0 .  aspirin EC 81 MG tablet, Take 81 mg by mouth daily. Swallow whole., Disp: , Rfl:  .  atorvastatin (LIPITOR) 40 MG tablet, Take 1 tablet (40 mg total) by mouth daily., Disp: 90 tablet, Rfl: 4 .  benzonatate (TESSALON) 200 MG capsule, Take 1 capsule (200 mg total) by mouth 3 (three) times daily as needed for cough., Disp: 30 capsule, Rfl: 0 .  Budeson-Glycopyrrol-Formoterol (BREZTRI AEROSPHERE) 160-9-4.8 MCG/ACT AERO, Inhale 2 puffs into the lungs 2 (two) times daily., Disp: 5.9 g, Rfl: 0 .  clonazePAM (KLONOPIN) 0.5 MG tablet, Take 0.5 mg by mouth daily., Disp: , Rfl:  .  dexamethasone (DECADRON) 4 MG tablet, Take 2 tablets (8 mg total) by mouth daily. Start the day after bendamustine chemotherapy for 2 days. Take with food., Disp: 30 tablet,  Rfl: 1 .  DULoxetine (CYMBALTA) 60 MG capsule, Take 1 capsule (60 mg total) by mouth daily., Disp: 90 capsule, Rfl: 0 .  EUCRISA 2 % OINT, Apply  a small amount to affected area   qd/bid to aa's chest, inframammary, eye, Disp: , Rfl:  .  finasteride (PROSCAR) 5 MG tablet, TAKE 1 TABLET BY MOUTH ONCE DAILY, Disp: 90 tablet, Rfl: 2 .  HYDROcodone bit-homatropine (HYCODAN) 5-1.5 MG/5ML syrup, Take 5 mLs by mouth every 6 (six) hours as needed for cough., Disp: 120 mL, Rfl: 0 .  lansoprazole (PREVACID) 30 MG capsule, TAKE 1 CAPSULE BY MOUTH ONCE DAILY AT NOON, Disp: 30 capsule, Rfl: 2 .   lidocaine-prilocaine (EMLA) cream, Apply to affected area once, Disp: 30 g, Rfl: 3 .  loratadine (CLARITIN) 10 MG tablet, Take 10 mg by mouth daily. , Disp: , Rfl:  .  metoprolol succinate (TOPROL-XL) 25 MG 24 hr tablet, Take 1 tablet (25 mg total) by mouth daily., Disp: 90 tablet, Rfl: 1 .  modafinil (PROVIGIL) 200 MG tablet, Take 200 mg by mouth daily. (Patient not taking: Reported on 04/08/2021), Disp: , Rfl:  .  montelukast (SINGULAIR) 10 MG tablet, Take 1 tablet (10 mg total) by mouth at bedtime., Disp: 90 tablet, Rfl: 1 .  mupirocin ointment (BACTROBAN) 2 %, Apply 1 application topically 2 (two) times daily., Disp: 22 g, Rfl: 0 .  naltrexone (DEPADE) 50 MG tablet, , Disp: , Rfl:  .  OLANZapine (ZYPREXA) 7.5 MG tablet, Take 7.5 mg by mouth at bedtime., Disp: , Rfl:  .  ondansetron (ZOFRAN) 8 MG tablet, Take 1 tablet (8 mg total) by mouth 2 (two) times daily as needed for refractory nausea / vomiting. Start on day 2 after bendamustine chemo. (Patient not taking: Reported on 04/08/2021), Disp: 30 tablet, Rfl: 1 .  prochlorperazine (COMPAZINE) 10 MG tablet, Take 1 tablet (10 mg total) by mouth every 6 (six) hours as needed (Nausea or vomiting). (Patient not taking: Reported on 04/08/2021), Disp: 30 tablet, Rfl: 1 .  tacrolimus (PROTOPIC) 0.1 % ointment, Apply 1 application topically 2 (two) times daily., Disp: , Rfl:  .  tadalafil (CIALIS) 20 MG tablet, 1 tab 1 hour prior to intercourse, Disp: 10 tablet, Rfl: 0 .  tenofovir (VIREAD) 300 MG tablet, Take 1 tablet (300 mg total) by mouth daily. (Patient not taking: Reported on 04/08/2021), Disp: 90 tablet, Rfl: 2 .  testosterone cypionate (DEPOTESTOSTERONE CYPIONATE) 200 MG/ML injection, Inject 1 mL (200 mg total) into the muscle every 14 (fourteen) days., Disp: 10 mL, Rfl: 0 .  triamcinolone (KENALOG) 0.1 %, Apply to affected areas 1-2 times a day until rash improved. Avoid face, groin, underarms., Disp: 80 g, Rfl: 1  Objective: Patient appears/sounds  fatigued, strong productive cough heard. No apparent distress.  Breathing is non labored.  Speaking in full sentences. Mood and behavior are normal.  Laboratory Data:  Recent Results (from the past 2160 hour(s))  PSA     Status: None   Collection Time: 02/17/21  2:06 PM  Result Value Ref Range   Prostate Specific Ag, Serum 1.8 0.0 - 4.0 ng/mL    Comment: Roche ECLIA methodology. According to the American Urological Association, Serum PSA should decrease and remain at undetectable levels after radical prostatectomy. The AUA defines biochemical recurrence as an initial PSA value 0.2 ng/mL or greater followed by a subsequent confirmatory PSA value 0.2 ng/mL or greater. Values obtained with different assay methods or kits cannot be used interchangeably. Results cannot  be interpreted as absolute evidence of the presence or absence of malignant disease.   Hematocrit     Status: None   Collection Time: 02/17/21  2:06 PM  Result Value Ref Range   Hematocrit 42.8 37.5 - 51.0 %  Testosterone     Status: None   Collection Time: 02/17/21  2:06 PM  Result Value Ref Range   Testosterone 384 264 - 916 ng/dL    Comment: Adult male reference interval is based on a population of healthy nonobese males (BMI <30) between 36 and 39 years old. Guin, [place] [phone]. PMID: [id number].   I-STAT creatinine     Status: None   Collection Time: 02/25/21  2:51 PM  Result Value Ref Range   Creatinine, Ser 0.90 0.61 - 1.24 mg/dL  Lactate dehydrogenase     Status: None   Collection Time: 02/27/21  1:51 PM  Result Value Ref Range   LDH 149 98 - 192 U/L    Comment: Performed at [facility], [address] [place], [place] [address]  Comprehensive metabolic panel     Status: Abnormal   Collection Time: 02/27/21  1:51 PM  Result Value Ref Range   Sodium 136 135 - 145 mmol/L   Potassium 4.3 3.5 - 5.1 mmol/L   Chloride 98 98 - 111 mmol/L   CO2 24 22 - 32 mmol/L   Glucose, Bld 112  (H) 70 - 99 mg/dL    Comment: Glucose reference range applies only to samples taken after fasting for at least 8 hours.   BUN 12 6 - 20 mg/dL   Creatinine, Ser 0.91 0.61 - 1.24 mg/dL   Calcium 8.9 8.9 - 10.3 mg/dL   Total Protein 6.9 6.5 - 8.1 g/dL   Albumin 4.0 3.5 - 5.0 g/dL   AST 28 15 - 41 U/L   ALT 22 0 - 44 U/L   Alkaline Phosphatase 140 (H) 38 - 126 U/L   Total Bilirubin 0.8 0.3 - 1.2 mg/dL   GFR, Estimated >60 >60 mL/min    Comment: (NOTE) Calculated using the CKD-EPI Creatinine Equation (2021)    Anion gap 14 5 - 15    Comment: Performed at [facility], [address] [place], [place] [address]  CBC with Differential     Status: Abnormal   Collection Time: 02/27/21  1:51 PM  Result Value Ref Range   WBC 7.3 4.0 - 10.5 K/uL   RBC 4.62 4.22 - 5.81 MIL/uL   Hemoglobin 14.7 13.0 - 17.0 g/dL   HCT 41.4 39.0 - 52.0 %   MCV 89.6 80.0 - 100.0 fL   MCH 31.8 26.0 - 34.0 pg   MCHC 35.5 30.0 - 36.0 g/dL   RDW 12.8 11.5 - 15.5 %   Platelets 91 (L) 150 - 400 K/uL    Comment: SPECIMEN CHECKED FOR CLOTS   nRBC 0.0 0.0 - 0.2 %   Neutrophils Relative % 33 %   Neutro Abs 2.4 1.7 - 7.7 K/uL   Lymphocytes Relative 56 %   Lymphs Abs 4.0 0.7 - 4.0 K/uL   Monocytes Relative 10 %   Monocytes Absolute 0.8 0.1 - 1.0 K/uL   Eosinophils Relative 1 %   Eosinophils Absolute 0.1 0.0 - 0.5 K/uL   Basophils Relative 0 %   Basophils Absolute 0.0 0.0 - 0.1 K/uL   Immature Granulocytes 0 %   Abs Immature Granulocytes 0.02 0.00 - 0.07 K/uL    Comment: Performed at [facility],  [place], [place] [address]  Surgical pathology     Status: None   Collection Time: 03/03/21 12:00 AM  Result Value Ref Range   SURGICAL PATHOLOGY      Surgical Pathology CASE: [id number] PATIENT: [place] Simpson Flow Pathology Report     Clinical history: follicular lymphoma     DIAGNOSIS:  - Monoclonal B-cell population identified, see comment.  COMMENT:  There is a  monoclonal B-cell population without expression of CD5 or CD10. The findings are consistent with a non-Hodgkin B-cell lymphoma. Please see bone marrow [id number] for further details.   GATING AND PHENOTYPIC ANALYSIS:  Gated population: Flow cytometric immunophenotyping is performed using antibodies to the antigens listed in the table below. Electronic gates are placed around a cell cluster displaying light scatter properties corresponding to: lymphocytes  Abnormal Cells in gated population: 48%  Phenotype of Abnormal Cells: CD19, CD20, CD200, Kappa                       Lymphoid Antigens       Myeloid Antigens Miscellaneous CD2  NEG  CD10 NEG  CD11b     ND   CD45 POS CD3  NEG  CD19 POS  CD11c     ND   HLA-Dr    ND CD4  NEG  CD20  POS  CD13 ND   CD34 NEG CD5  NEG  CD22 ND   CD14 ND   CD38 NEG CD7  NEG  CD79b     ND   CD15 ND   CD138     ND CD8  NEG  CD103     ND   CD16 ND   TdT  ND CD25 ND   CD200     POS  CD33 ND   CD123     ND TCRab     ND   sKappa    POS  CD64 ND   CD41 ND TCRgd     NEG  sLambda   NEG  CD117     ND   CD61 ND CD56 NEG  cKappa    ND   MPO  ND   CD71 ND CD57 ND   cLambda   ND        [id number]     GROSS DESCRIPTION:  Reference case [id number]    Final Diagnosis performed by Vicente Males, MD.   Electronically signed 03/05/2021 Technical and / or Professional components performed at [facility], [place] [address] [place], [place] [address].  The above tests were developed and their performance characteristics determined by the [facility] system for the physical and immunophenotypic characterization of cell populations. They have not been cleared by the U.S. Food and Drug administration. The  FDA has determined that such clearance or approval is not necessar y. This test is used for clinical purposes. It should not be  regarded as investigational or for research   CBC with Differential/Platelet     Status: Abnormal   Collection Time:  03/03/21  8:42 AM  Result Value Ref Range   WBC 8.6 4.0 - 10.5 K/uL   RBC 4.67 4.22 - 5.81 MIL/uL   Hemoglobin 14.8 13.0 - 17.0 g/dL   HCT 42.7 39.0 - 52.0 %   MCV 91.4 80.0 - 100.0 fL   MCH 31.7 26.0 - 34.0 pg   MCHC 34.7 30.0 - 36.0 g/dL   RDW 13.3 11.5 - 15.5 %   Platelets 119 (L) 150 -  400 K/uL    Comment: Immature Platelet Fraction may be clinically indicated, consider ordering this additional test [id number]    nRBC 0.0 0.0 - 0.2 %   Neutrophils Relative % 29 %   Neutro Abs 2.5 1.7 - 7.7 K/uL   Lymphocytes Relative 61 %   Lymphs Abs 5.3 (H) 0.7 - 4.0 K/uL   Monocytes Relative 9 %   Monocytes Absolute 0.8 0.1 - 1.0 K/uL   Eosinophils Relative 1 %   Eosinophils Absolute 0.1 0.0 - 0.5 K/uL   Basophils Relative 0 %   Basophils Absolute 0.0 0.0 - 0.1 K/uL   Immature Granulocytes 0 %   Abs Immature Granulocytes 0.02 0.00 - 0.07 K/uL    Comment: Performed at [facility], [address] [place], [place] [address]  Surgical pathology     Status: None   Collection Time: 03/03/21 10:10 AM  Result Value Ref Range   SURGICAL PATHOLOGY      SURGICAL PATHOLOGY * THIS IS AN ADDENDUM REPORT * CASE: [id number] PATIENT: [place] Simpson Bone Marrow Report *Addendum *  Reason for Addendum #1:  Cytogenetics results  Clinical History: posterior right iliac, (ADC)     DIAGNOSIS:  BONE MARROW, ASPIRATE, CLOT, CORE: - Involvement by non-Hodgkin B-cell lymphoma, see comment. - Mild megakaryocytic dysplasia.  PERIPHERAL BLOOD: - Mild thrombocytopenia.  COMMENT:  The marrow shows moderate involvement by a non-Hodgkin B-cell lymphoma. While CD10 is not convincing by immunohistochemistry (background, crush, etc) or flow cytometry, there is bcl-6 positivity. Combined with the patient's history, the findings favor a low grade follicular lymphoma. Additionally, there is mild megakaryocytic dysplasia and while this could be reactive in nature, correlation with FISH for a  myelodysplastic syndrome is recommended.  MICROSCOPIC DESCRIPTION:  PERIPHERAL BLOOD SMEAR: Red blood cell morphol ogy is relatively unremarkable.  Leukocytes are present in normal numbers.  Platelets are mildly decreased in numbers.  BONE MARROW ASPIRATE: Spicular and cellular Erythroid precursors: Mild relative increase in numbers.  No significant dysplasia Granulocytic precursors: Mild relative decrease in numbers.  No significant dysplasia.  No increase in blasts. Megakaryocytes: Present with occasional small and/or hypolobated forms and rare forms with widely separated nuclei. Lymphocytes/plasma cells: Lymphocytes are increased (59% by manual differential counts).  The lymphocytes are predominantly small with cleaved/irregular nuclear contours.  Plasma cells are not increased.  TOUCH PREPARATIONS: Similar to aspirate smears.  CLOT AND BIOPSY: The core biopsy exhibits crush artifact, but appears hypercellular for age (90%). There is a diffuse lymphocytic infiltrate with associated fibrosis.  Mild crush artifact hampers interpretation the majority of cells appear small in siz e with irregular nuclear contours and small nucleoli.  A distinct large cell population is not identified.  By immunohistochemistry the lymphocytes are positive for CD20, BCL6 (focal, weak), and BCL-2. CD10 appears negative to very weakly positive, although there is abundant background staining.  There is focal background hematopoiesis with a few atypical megakaryocytes. The clot section is limited but has a similar appearance.  IRON STAIN: Iron stains are performed on a bone marrow aspirate or touch imprint smear and section of clot. The controls stained appropriately.       Storage Iron: Present      Ring Sideroblasts: Absent  ADDITIONAL DATA/TESTING: Cytogenetics, including FISH for MDS and t(14;18) was ordered and will be reported in addendum.  Flow cytometry ([id number]) reveals a monoclonal  B-cell population without expression of CD5 or CD10.   CELL COUNT DATA:  Bone Marrow count performed on 500 cells  shows: Blasts:   0%   Myeloid:  22% Promyelocytes: 0%    Erythroid:     17% Myelocytes:    7%   Lymphocytes:   59% Metamyelocytes:     0%   Plasma cells:  0% Bands:    9% Neutrophils:   5%   M:E ratio:     1.29 Eosinophils:   1% Basophils:     0% Monocytes:     2%  Lab Data: CBC performed on 03/03/2021 shows: WBC: 8.6 k/uL  Neutrophils:   27% Hgb: 14.8 g/dL Lymphocytes:   68% HCT: 42.7 %    Monocytes:     5% MCV: 91.4 fL   Eosinophils:   0% RDW: 13.3 %    Basophils:     0% PLT: 119 k/uL    GROSS DESCRIPTION:  Specimen A: Aspirate smear.  Specimen B: Received in B-plus is scant dark red soft material, submitted in 1 block.  Specimen C: Received in B-plus are 2 cores of tan-red firm tissue, each averaging 1.6 x 0.2 cm, submitted in 1 block following decalcification with Immunocal.  SW 03/03/2021   Final Diagnosis performed by Vicente Males, MD.   Electronically signed 03/05/2021 Technical and / or Professional components performed at [facility], [place] [address], [place], [place] [address].  I mmunohistochemistry Technical component (if applicable) was performed at [facility] [address] [place], [place], [place] [address].   IMMUNOHISTOCHEMISTRY DISCLAIMER (if applicable): Some of these immunohistochemical stains may have been developed and the performance characteristics determine by [facility]. Some may not have been cleared or approved by the U.S. Food and Drug Administration. The FDA has determined that such clearance or approval is not necessary. This test is used for clinical purposes. It should not be regarded as investigational or for research. This laboratory is certified under the [place] (CLIA-88) as qualified to perform high complexity clinical  laboratory testing.  The controls stained appropriately.  ADDENDUM:  CYTOGENETIC RESULTS:  Karyotype: 46,XY[20]  Interpretation: NORMAL MALE KARYOTYPE  Cytogenetic analysis shows a normal male karyotype in all cells anal yzed  ** Please note this testing was performed and interpreted by an outside facility ([facility]).  This addendum is only being added to provide a summary of the results for report completeness.  Please see electronic medical record for a copy of the full report. **           Addendum #1 performed by Vicente Males, MD.   Electronically signed 03/17/2021 Technical and / or Professional components performed at [facility], [place] [address] [place], [place] [address].  Immunohistochemistry Technical component (if applicable) was performed at [facility]. [address] [place], [place],  [address].   IMMUNOHISTOCHEMISTRY DISCLAIMER (if applicable): Some of these immunohistochemical stains may have been developed and the performance characteristics determine by [facility]. Some may not have been cleared or approved by the U.S. Food and Drug Administration. The FDA has determined that such clearance or app roval is not necessary. This test is used for clinical purposes. It should not be regarded as investigational or for research. This laboratory is certified under the [place] (CLIA-88) as qualified to perform high complexity clinical laboratory testing.  The controls stained appropriately.   Hepatitis B surface antigen     Status: None   Collection Time: 03/10/21 12:24 PM  Result Value Ref Range  Hepatitis B Surface Ag NON REACTIVE NON REACTIVE    Comment: Performed at [facility], [place] [address], [place], [place] [address]  Hepatitis B surface antibody,quantitative     Status: None   Collection Time: 03/10/21 12:24 PM  Result Value Ref Range    Hepatitis B-Post >1,000.0 Immunity>9.9 mIU/mL    Comment: (NOTE)  Status of Immunity                     Anti-HBs Level  ------------------                     -------------- Inconsistent with Immunity                   0.0 - 9.9 Consistent with Immunity                          >9.9 Performed At: [facility] [address] [place], [place] [id number] Rush Farmer MD PJ:[id number]   Hepatitis B core antibody, total     Status: Abnormal   Collection Time: 03/10/21 12:24 PM  Result Value Ref Range   Hep B Core Total Ab Reactive (A) NON REACTIVE    Comment: Performed at [facility], [place] [address], [place], [place] [address]  Glucose, capillary     Status: Abnormal   Collection Time: 03/10/21  1:06 PM  Result Value Ref Range   Glucose-Capillary 122 (H) 70 - 99 mg/dL    Comment: Glucose reference range applies only to samples taken after fasting for at least 8 hours.  Surgical pathology     Status: None   Collection Time: 03/19/21 11:05 AM  Result Value Ref Range   SURGICAL PATHOLOGY      SURGICAL PATHOLOGY CASE: [id number] PATIENT: [place] Simpson Surgical Pathology Report     Specimen Submitted: A. Lymph node, right  Clinical History: History of grade 1 follicular lymphoma.  Imaging evidence of disease progression.  Lymphoma.  Prior axillary LN biopsy in 8/21 and recent bone marrow biopsy.    DIAGNOSIS: A. 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  Comment: Core biopsy sections display lymphoid tissue with an irregular and expanded follicular architecture.  Lymphoid tissue is comprised predominantly of sheets of small lymphocytes with speckled chromatin. There is no significant increase in larger centroblastic cells (less than 5 per HPF).  Immunohistochemical studies show a marked predominance of CD20+ B cells, with relatively fewer CD3+ T cells.  CD5 shows  appropriate staining of T cells, without aberrant B-cell marking.  B cells are diffusely positive for B CL2 and CD10 (dim, with BCL6 highlighting irregular germinal centers.  A cyclin D1 stain is negative.  Ki-67 shows increased staining within germinal centers, but marks only 5-10% of background B cells.  Concurrent flow cytometric studies identified a CD10 positive monoclonal B-cell population with mostly low forward scatter, suggestive of small cells.  For further details, see scanned report in [facility].  The patient's prior history of follicular lymphoma involving right axillary lymph node ([id number]), is noted, as is subsequent demonstration of bone marrow involvement ([id number]).  The current findings are compatible with the patient's previously diagnosed low-grade follicular lymphoma.  There is no histologic evidence of a higher grade process in the current sample.  However, the presence of higher grade disease elsewhere cannot be entirely excluded.  Clinical and radiographic correlation is  recommended.  IHC slides were prepared by [facility] for [place] ogy and Pathology, [place], [place]. All controls stained appropriately.  This test was developed and its performance characteristics determined by [facility]. It has not been cleared or approved by the [place] Food and Drug Administration. The FDA does not require this test to go through premarket FDA review. This test is used for clinical purposes. It should not be regarded as investigational or for research. This laboratory is certified under the Clinical Laboratory Improvement Amendments (CLIA) as qualified to perform high complexity clinical laboratory testing.  GROSS DESCRIPTION: A. Labeled: Right lymph node (per requisition right inguinal lymph node biopsy) Received: Fresh on saline-soaked gauze Collection time: 11:05 AM on 03/19/2021 Placed into formalin time: 11:16 AM on 03/19/2021 Number of needle core biopsy(s):  5 Length: Range from 1.7 to 2.1 cm Diameter: 0.1 cm Description: Received are cores of tan-white soft tissue.  A touch preparation with Diff-Quik staining is perfo rmed.  2 cores are bisected and one half of each core is submitted for flow cytometry.  The remainder of the specimen is submitted for routine histology. Ink: None Entirely submitted in cassettes 1-2 with 1 core in cassette 1, 2 cores in cassette 2, and 2 cores (remaining halves) in cassette 3.  RB 03/19/2021  Final Diagnosis performed by Allena Napoleon, MD.   Electronically signed 03/21/2021 1:17:24PM The electronic signature indicates that the named Attending Pathologist has evaluated the specimen Technical component performed at [facility], [address], [place], [place] [address] Lab: [phone] Dir: Rush Farmer, MD, MMM  Professional component performed at [facility], [facility], [place], [place], [place] [address] Lab: [phone] Dir: Dellia Nims. Rubinas, MD   SARS CORONAVIRUS 2 (TAT 6-24 HRS) Nasopharyngeal Nasopharyngeal Swab     Status: None   Collection Time: 04/01/21 11:38 AM   Specimen: Nasopharyngeal Swab  Result Value Ref Range   SARS Coronavirus 2 NEGATIVE NEGATIVE    Comment: (NOTE) SARS-CoV-2 target nucleic acids are NOT DETECTED.  The SARS-CoV-2 RNA is generally detectable in upper and lower respiratory specimens during the acute phase of infection. Negative results do not preclude SARS-CoV-2 infection, do not rule out co-infections with other pathogens, and should not be used as the sole basis for treatment or other patient management decisions. Negative results must be combined with clinical observations, patient history, and epidemiological information. The expected result is Negative.  Fact Sheet for Patients: SugarRoll.be  Fact Sheet for Healthcare Providers: https://www.woods-mathews.com/  This test is not yet approved or  cleared by the [place] FDA and  has been authorized for detection and/or diagnosis of SARS-CoV-2 by FDA under an Emergency Use Authorization (EUA). This EUA will remain  in effect (meaning this test can be used) for the duration of the COVID-19 declaration under Se ction 564(b)(1) of the Act, 21 U.S.C. section 360bbb-3(b)(1), unless the authorization is terminated or revoked sooner.  Performed at [facility], [place] [address], [place], [place] [address]   CBC with Differential     Status: Abnormal   Collection Time: 04/08/21  8:04 AM  Result Value Ref Range   WBC 9.3 4.0 - 10.5 K/uL   RBC 4.28 4.22 - 5.81 MIL/uL   Hemoglobin 13.4 13.0 - 17.0 g/dL   HCT 39.9 39.0 - 52.0 %   MCV 93.2 80.0 - 100.0 fL   MCH 31.3 26.0 - 34.0 pg   MCHC 33.6 30.0 - 36.0 g/dL   RDW 13.2 11.5 - 15.5 %  Platelets 105 (L) 150 - 400 K/uL   nRBC 0.0 0.0 - 0.2 %   Neutrophils Relative % 22 %   Neutro Abs 2.0 1.7 - 7.7 K/uL   Lymphocytes Relative 67 %   Lymphs Abs 6.2 (H) 0.7 - 4.0 K/uL   Monocytes Relative 10 %   Monocytes Absolute 0.9 0.1 - 1.0 K/uL   Eosinophils Relative 1 %   Eosinophils Absolute 0.1 0.0 - 0.5 K/uL   Basophils Relative 0 %   Basophils Absolute 0.0 0.0 - 0.1 K/uL   WBC Morphology      DIFF CONFIRMED BY MANUAL. FEW ABNORMAL AND OCC LARGE GRANULAR LYMPHOCYTES NOTED.   RBC Morphology RARE POLYCHROMATOPHILIC RBCS AND RARE NRBC NOTED.    Smear Review Normal platelet morphology     Comment: PLATELETS APPEAR DECREASED   Immature Granulocytes 0 %   Abs Immature Granulocytes 0.03 0.00 - 0.07 K/uL    Comment: Performed at [facility], [address] [place], [place] [address]  Comprehensive metabolic panel     Status: Abnormal   Collection Time: 04/08/21  8:04 AM  Result Value Ref Range   Sodium 135 135 - 145 mmol/L   Potassium 4.0 3.5 - 5.1 mmol/L   Chloride 98 98 - 111 mmol/L   CO2 25 22 - 32 mmol/L   Glucose, Bld 106 (H) 70 - 99 mg/dL    Comment: Glucose reference  range applies only to samples taken after fasting for at least 8 hours.   BUN 12 6 - 20 mg/dL   Creatinine, Ser 0.92 0.61 - 1.24 mg/dL   Calcium 8.9 8.9 - 10.3 mg/dL   Total Protein 6.7 6.5 - 8.1 g/dL   Albumin 3.9 3.5 - 5.0 g/dL   AST 32 15 - 41 U/L   ALT 19 0 - 44 U/L   Alkaline Phosphatase 146 (H) 38 - 126 U/L   Total Bilirubin 1.0 0.3 - 1.2 mg/dL   GFR, Estimated >60 >60 mL/min    Comment: (NOTE) Calculated using the CKD-EPI Creatinine Equation (2021)    Anion gap 12 5 - 15    Comment: Performed at [facility], [address] [place],  [address]     Assessment: 60 y.o. male with mild/moderate COVID 19 viral infection diagnosed on 04/11/21 at high risk for progression to severe COVID 19.  Plan:  This patient is a 60 y.o. male that meets the following criteria for Emergency Use Authorization of: Paxlovid 1. Age >12 yr AND > 40 kg 2. SARS-COV-2 positive test 3. Symptom onset < 5 days 4. Mild-to-moderate COVID disease with high risk for severe progression to hospitalization or death  I have spoken and communicated the following to the patient or parent/caregiver regarding: 1. Paxlovid is an unapproved drug that is authorized for use under an Emergency Use Authorization.  2. There are no adequate, approved, available products for the treatment of COVID-19 in adults who have mild-to-moderate COVID-19 and are at high risk for progressing to severe COVID-19, including hospitalization or death. 3. Other therapeutics are currently authorized. For additional information on all products authorized for treatment or prevention of COVID-19, please see TanEmporium.pl.  4. There are benefits and risks of taking this treatment as outlined in the "Fact Sheet for Patients and Caregivers."  5. "Fact Sheet for Patients and Caregivers" was reviewed with patient. A hard copy will be  provided to patient from pharmacy prior to the patient receiving treatment. 6. Patients should continue to self-isolate and  use infection control measures (e.g., wear mask, isolate, social distance, avoid sharing personal items, clean and disinfect "high touch" surfaces, and frequent handwashing) according to CDC guidelines.  7. The patient or parent/caregiver has the option to accept or refuse treatment. 8. Patient medication history was reviewed for potential drug interactions:Interaction with home meds: wellbutrin and paxlovid. Patient to hold wellbutrin during treatment with paxlovid.  9. Patient's GFR was calculated to be 131 ml/min, and they were therefore prescribed Normal dose (GFR>60) - nirmatrelvir 171m tab (2 tablet) by mouth twice daily AND ritonavir 1078mtab (1 tablet) by mouth twice daily   After reviewing above information with the patient, the patient agrees to receive Paxlovid.  Follow up instructions:    . Take prescription BID x 5 days as directed . Reach out to pharmacist for counseling on medication if desired . For concerns regarding further COVID symptoms please follow up with your PCP or urgent care . For urgent or life-threatening issues, seek care at your local emergency department  The patient was provided an opportunity to ask questions, and all were answered. The patient agreed with the plan and demonstrated an understanding of the instructions.   Script sent to [facility] to pick up RX.  The patient was advised to call their PCP or seek an in-person evaluation if the symptoms worsen or if the condition fails to improve as anticipated.   I provided 20 minutes of non face-to-face telephone visit time during this encounter, and > 50% was spent counseling as documented under my assessment & plan.  LaVerlon AuNP 04/11/2021 /1:47 PM

## 2021-04-11 NOTE — Telephone Encounter (Signed)
Lauren NP had called pt about his covid test posiitive. . She also put him on oral meds to help from side effects of covid. Dr Janese Banks says to cancel the 5/20 appt for poss. Fluids since pt has covid. I called the house/ cell phone and his girlfriend answered and said he was asleep. They did pick up meds for him today for the covid. I explained about the above cancellation and told her that if he has any needs to let us know. She got his schedule and marked out the 5/20 appts

## 2021-04-11 NOTE — Telephone Encounter (Signed)
Call from unidentified family member stating that patient tested positive on a home OCVID test last night and they want a call returned to patient to know what his next steps are to be, ?retest at a facility r what Please advise

## 2021-04-11 NOTE — Telephone Encounter (Signed)
Hey I will add him to our list and see if he qualifies.   Faythe Casa, NP 04/11/2021 12:28 PM

## 2021-04-11 NOTE — Patient Instructions (Signed)
You are being prescribed PAXLOVID for COVID-19 infection.   Please pick up your prescription at: [facility]  Address:  [facility], [address], [place], [place] [address]      Hours:  Monday - Friday 7:30 am - 5:30 pm.  Saturday Closed   Sunday Closed  Phone: [phone]   Please call the pharmacy and a pharmacist will bring the medication out to your car.  Medications to hold while taking this treatment: wellbutrin You can resume them 24 hours after your last dose of paxlovid.   ADMINISTRATION INSTRUCTIONS: 1. Take with or without food. Swallow the tablets whole. Don't chew, crush, or break the medications because it might not work as well  2. For each dose of the medication, you should be taking 3 tablets together (2 pink oval and 1 white oval) TWICE a day for FIVE days   3. Finish your full five-day course of Paxlovid even if you feel better before you're done. Stopping this medication too early can make it less effective to prevent severe illness related to [place].    4. Paxlovid is prescribed for YOU ONLY. Don't share it with others, even if they have similar symptoms as you. This medication might not be right for everyone.  5. Make sure to take steps to protect yourself and others while you're taking this medication in order to get well soon and to prevent others from getting sick with COVID-19.  6. Paxlovid (nirmatrelvir / ritonavir) can cause hormonal birth control medications to not work well. If you or your partner is currently taking hormonal birth control, use condoms or other birth control methods to prevent unintended pregnancies.  COMMON SIDE EFFECTS: 1. Altered or bad taste in your mouth  2. Diarrhea  3. High blood pressure (1% of people) 4. Muscle aches (1% of people)   If your COVID-19 symptoms get worse, get medical help right away. Call 911 if you experience symptoms such as worsening cough, trouble breathing, chest pain that doesn't  go away, confusion, a hard time staying awake, and pale or blue-colored skin. This medication won't prevent all COVID-19 cases from getting worse.   If you have questions or concerns, please call the [place] [place] at [phone].

## 2021-04-11 NOTE — Telephone Encounter (Signed)
I have spoken to [facility] and she will call the patient.  Lauren please see if he would qualify for Paxlovid He has already received evushield

## 2021-04-16 ENCOUNTER — Telehealth: Payer: Self-pay | Admitting: Nurse Practitioner

## 2021-04-16 DIAGNOSIS — U071 COVID-19: Secondary | ICD-10-CM

## 2021-04-16 NOTE — Telephone Encounter (Signed)
Called patient to determine candidacy for treatment for covid 19 infection. No answer. Left voicemail.

## 2021-04-17 ENCOUNTER — Ambulatory Visit: Payer: Self-pay

## 2021-04-17 ENCOUNTER — Inpatient Hospital Stay (HOSPITAL_BASED_OUTPATIENT_CLINIC_OR_DEPARTMENT_OTHER): Payer: Medicare HMO | Admitting: Nurse Practitioner

## 2021-04-17 ENCOUNTER — Telehealth: Payer: Medicare HMO | Admitting: Internal Medicine

## 2021-04-17 ENCOUNTER — Telehealth: Payer: Self-pay | Admitting: *Deleted

## 2021-04-17 DIAGNOSIS — U071 COVID-19: Secondary | ICD-10-CM | POA: Diagnosis not present

## 2021-04-17 MED ORDER — HYDROCODONE BIT-HOMATROP MBR 5-1.5 MG/5ML PO SOLN: 5.0000 mL | ORAL | 0 refills | Status: DC | PRN

## 2021-04-17 NOTE — Telephone Encounter (Signed)
Called and advised patient as below.

## 2021-04-17 NOTE — Telephone Encounter (Signed)
Pt. 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 Reports he is having weakness and dizziness .These episodes happen more at night and lasts a few minutes.States sometimes he "feels like I'm convulsing." Does not lose consciousness.No availability in the practice today. Recommended UC or [facility] tele health visit. Declines . Requests face-to-face visit.Please advise pt.   Answer Assessment - Initial Assessment Questions 1. ONSET: "When did the cough begin?"      1 year ago 2. SEVERITY: "How bad is the cough today?"      Moderate 3. SPUTUM: "Describe the color of your sputum" (none, dry cough; clear, white, yellow, green)     White 4. HEMOPTYSIS: "Are you coughing up any blood?" If so ask: "How much?" (flecks, streaks, tablespoons, etc.)     No 5. DIFFICULTY BREATHING: "Are you having difficulty breathing?" If Yes, ask: "How bad is it?" (e.g., mild, moderate, severe)    - MILD: No SOB at rest, mild SOB with walking, speaks normally in sentences, can lie down, no retractions, pulse < 100.    - MODERATE: SOB at rest, SOB with minimal exertion and prefers to sit, cannot lie down flat, speaks in phrases, mild retractions, audible wheezing, pulse 100-120.    - SEVERE: Very SOB at rest, speaks in single words, struggling to breathe, sitting hunched forward, retractions, pulse > 120      No 6. FEVER: "Do you have a fever?" If Yes, ask: "What is your temperature, how was it measured, and when did it start?"     No 7. CARDIAC HISTORY: "Do you have any history of heart disease?" (e.g., heart attack, congestive heart failure)      No 8. LUNG HISTORY: "Do you have any history of lung disease?"  (e.g., pulmonary embolus, asthma, emphysema)     Cancer 9. PE RISK FACTORS: "Do you have a history of blood clots?" (or: recent major surgery, recent prolonged travel, bedridden)     No 10. OTHER SYMPTOMS: "Do you have any other symptoms?" (e.g., runny nose, wheezing, chest  pain)       Weakness, dizzy 11. PREGNANCY: "Is there any chance you are pregnant?" "When was your last menstrual period?"       n/a 12. TRAVEL: "Have you traveled out of the country in the last month?" (e.g., travel history, exposures)       No  Protocols used: [place]

## 2021-04-17 NOTE — Progress Notes (Signed)
Virtual Visit Progress Note  Symptom Management Clinic [facility]  [phone] [phone]  I connected with Russell Simpson on 04/17/21 at 10:30 AM EDT by telephone visit and verified that I am speaking with the correct person using two identifiers.   I discussed the limitations, risks, security and privacy concerns of performing an evaluation and management service by telemedicine and the availability of in-person appointments. I also discussed with the patient that there may be a patient responsible charge related to this service. The patient expressed understanding and agreed to proceed.   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  States that symptoms started  approximately 7 days ago.  He has a cough at baseline since his lymphoma diagnosis however, cough became worse.  He tested positive on home test.  Cough interrupts his sleep. Has been taking hycodan which improves it temporarily. No fevers or chills. Symptoms seem to be improving. Patient was vaccinated and boosted. No shortness of breath or chest pain. No nausea, vomiting, diarrhea. Feels weak and tired but able to perform ADLs.   Review of systems- ROS  General: negative for fevers, changes in weight or night sweats Skin: negative for changes in moles or sores or rash Eyes: negative for changes in vision HEENT: negative for change in hearing, tinnitus, voice changes Pulmonary: negative for dyspnea, orthopnea, wheezing Cardiac: negative for palpitations, pain Gastrointestinal: negative for nausea, vomiting, constipation, diarrhea, hematemesis, hematochezia Genitourinary/Sexual: negative for dysuria, retention, hematuria, incontinence Musculoskeletal: negative for pain, joint pain, back pain Hematology: negative for easy bruising, abnormal bleeding Neurologic/Psych: negative for headaches, seizures, paralysis, weakness, numbness   Allergies  Allergen Reactions  . Penicillins Anaphylaxis  . Tetanus Toxoids Swelling  . Lisinopril Cough  . Losartan      Other reaction(s): Muscle Pain    . Tetanus Toxoid   . Codeine Nausea Only and Nausea And Vomiting  . Doxycycline Rash    Past Medical History:  Diagnosis Date  . 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 Arthritis   . Asthma   . Basal cell carcinoma 01/28/2021   R upper arm, EDC 03/04/2021  . Brain tumor (benign) ([place])    benign pituitary neoplasm  . Chronic pain    right arm  . Depression   . Environmental and seasonal allergies   . Follicular  lymphoma ([place])   . Hypertension   . Pneumonia   . Sleep apnea    does not wear CPAP ; uses humidifier instead  . Squamous cell carcinoma of skin 02/19/2021   L inferior mandible,  treated with [facility]     Past Surgical History:  Procedure Laterality Date  . ANTERIOR CERVICAL DECOMP/DISCECTOMY FUSION N/A 06/17/2016   Procedure: ANTERIOR CERVICAL DECOMPRESSION FUSION, CERVICAL 3-4, CERVICAL 4-5 WITH INSTRUMENTATION AND ALLOGRAFT;  Surgeon: Phylliss Bob, MD;  Location: [place];  Service: Orthopedics;  Laterality: N/A;  ANTERIOR CERVICAL DECOMPRESSION FUSION, CERVICAL 3-4, CERVICAL 4-5 WITH INSTRUMENTATION AND ALLOGRAFT  . BACK SURGERY    . NECK SURGERY  2009  . PORTA CATH INSERTION N/A 04/03/2021   Procedure: PORTA CATH INSERTION;  Surgeon: Algernon Huxley, MD;  Location: [place] [facility];  Service: Cardiovascular;  Laterality: N/A;    Social History   Socioeconomic History  . Marital status: Divorced    Spouse name: Not on file  . Number of children: Not on file  . Years of education: Not on file  . Highest education level: Not on file  Occupational History  . Not on file  Tobacco Use  . Smoking status: Current Every Day Smoker    Years: 20.00    Types: Cigars  . Smokeless tobacco: Never Used  . Tobacco comment: 5-6 cigars a day  Vaping Use  . Vaping Use: Former  . Start date: 04/30/2017  . Quit date: 11/30/2017  Substance and Sexual Activity  . Alcohol use: Not Currently    Alcohol/week: 0.0 standard drinks  . Drug use: No  . Sexual activity: Not Currently  Other Topics Concern  . Not on file  Social History Narrative  . Not on file   Social Determinants of Health   Financial Resource Strain: Not on file  Food Insecurity: Not on file  Transportation Needs: Not on file  Physical Activity: Not on file  Stress: Not on file  Social Connections: Not on file  Intimate Partner Violence: Not on file    Family History  Problem Relation Age of Onset  . Diabetes Mother   . Diabetes Other      Current Outpatient Medications:  .  acyclovir (ZOVIRAX) 400 MG tablet, Take 1 tablet (400 mg total) by mouth 2 (two) times daily., Disp: 60 tablet, Rfl: 2 .   allopurinol (ZYLOPRIM) 300 MG tablet, Take 1 tablet (300 mg total) by mouth daily., Disp: 30 tablet, Rfl: 3 .  amLODipine (NORVASC) 5 MG tablet, Take 1 tablet (5 mg total) by mouth daily., Disp: 90 tablet, Rfl: 0 .  aspirin EC 81 MG tablet, Take 81 mg by mouth daily. Swallow whole., Disp: , Rfl:  .  atorvastatin (LIPITOR) 40 MG tablet, Take 1 tablet (40 mg total) by mouth daily., Disp: 90 tablet, Rfl: 4 .  benzonatate (TESSALON) 200 MG capsule, Take 1 capsule (200 mg total) by mouth 3 (three) times daily as needed for cough., Disp: 30 capsule, Rfl: 0 .  BREO ELLIPTA 100-25 MCG/INH AEPB, Inhale 1 puff into the lungs daily., Disp: , Rfl:  .  Budeson-Glycopyrrol-Formoterol (BREZTRI AEROSPHERE) 160-9-4.8 MCG/ACT AERO, Inhale 2 puffs into the lungs 2 (two) times daily., Disp: 5.9 g, Rfl: 0 .  buPROPion (WELLBUTRIN XL) 150 MG 24 hr tablet, Take 1 tablet by mouth every morning., Disp: , Rfl:  .  clonazePAM (KLONOPIN) 0.5 MG tablet, Take 0.5 mg by mouth daily., Disp: , Rfl:  .  dexamethasone (DECADRON) 4  MG tablet, Take 2 tablets (8 mg total) by mouth daily. Start the day after bendamustine chemotherapy for 2 days. Take with food., Disp: 30 tablet, Rfl: 1 .  DULoxetine (CYMBALTA) 60 MG capsule, Take 1 capsule (60 mg total) by mouth daily., Disp: 90 capsule, Rfl: 0 .  EUCRISA 2 % OINT, Apply  a small amount to affected area   qd/bid to aa's chest, inframammary, eye, Disp: , Rfl:  .  Ferrous Sulfate (IRON) 142 (45 Fe) MG TBCR, Take 1 tablet by mouth as needed., Disp: , Rfl:  .  finasteride (PROSCAR) 5 MG tablet, TAKE 1 TABLET BY MOUTH ONCE DAILY, Disp: 90 tablet, Rfl: 2 .  HYDROcodone bit-homatropine (HYCODAN) 5-1.5 MG/5ML syrup, Take 5 mLs by mouth every 6 (six) hours as needed for cough., Disp: 120 mL, Rfl: 0 .  lansoprazole (PREVACID) 30 MG capsule, TAKE 1 CAPSULE BY MOUTH ONCE DAILY AT NOON, Disp: 30 capsule, Rfl: 2 .  lidocaine-prilocaine (EMLA) cream, Apply to affected area once, Disp: 30 g, Rfl: 3 .   loratadine (CLARITIN) 10 MG tablet, Take 10 mg by mouth daily. , Disp: , Rfl:  .  metoprolol succinate (TOPROL-XL) 25 MG 24 hr tablet, Take 1 tablet (25 mg total) by mouth daily., Disp: 90 tablet, Rfl: 1 .  mirtazapine (REMERON) 15 MG tablet, Take 15 mg by mouth at bedtime., Disp: , Rfl:  .  modafinil (PROVIGIL) 200 MG tablet, Take 200 mg by mouth daily. (Patient not taking: Reported on 04/08/2021), Disp: , Rfl:  .  montelukast (SINGULAIR) 10 MG tablet, Take 1 tablet (10 mg total) by mouth at bedtime., Disp: 90 tablet, Rfl: 1 .  mupirocin ointment (BACTROBAN) 2 %, Apply 1 application topically 2 (two) times daily., Disp: 22 g, Rfl: 0 .  naltrexone (DEPADE) 50 MG tablet, , Disp: , Rfl:  .  OLANZapine (ZYPREXA) 7.5 MG tablet, Take 7.5 mg by mouth at bedtime., Disp: , Rfl:  .  ondansetron (ZOFRAN) 8 MG tablet, Take 1 tablet (8 mg total) by mouth 2 (two) times daily as needed for refractory nausea / vomiting. Start on day 2 after bendamustine chemo. (Patient not taking: Reported on 04/08/2021), Disp: 30 tablet, Rfl: 1 .  PFIZER-BIONT COVID-19 VAC-TRIS SUSP injection, , Disp: , Rfl:  .  predniSONE (DELTASONE) 10 MG tablet, Take by mouth., Disp: , Rfl:  .  prochlorperazine (COMPAZINE) 10 MG tablet, Take 1 tablet (10 mg total) by mouth every 6 (six) hours as needed (Nausea or vomiting). (Patient not taking: Reported on 04/08/2021), Disp: 30 tablet, Rfl: 1 .  tacrolimus (PROTOPIC) 0.1 % ointment, Apply 1 application topically 2 (two) times daily., Disp: , Rfl:  .  tadalafil (CIALIS) 20 MG tablet, 1 tab 1 hour prior to intercourse, Disp: 10 tablet, Rfl: 0 .  tenofovir (VIREAD) 300 MG tablet, Take 1 tablet (300 mg total) by mouth daily. (Patient not taking: Reported on 04/08/2021), Disp: 90 tablet, Rfl: 2 .  testosterone cypionate (DEPOTESTOSTERONE CYPIONATE) 200 MG/ML injection, Inject 1 mL (200 mg total) into the muscle every 14 (fourteen) days., Disp: 10 mL, Rfl: 0 .  triamcinolone (KENALOG) 0.1 %, Apply to  affected areas 1-2 times a day until rash improved. Avoid face, groin, underarms., Disp: 80 g, Rfl: 1  Physical exam: Exam limited due to telemedicine  Lungs: Speaking in full sentences.  Does not sound like respiratory distress.  Strong productive cough heard Neuro: Alert, answering all questions appropriately.  Psych: Normal affect.   There were no vitals filed  for this visit. Physical Exam    CMP Latest Ref Rng & Units 04/08/2021  Glucose 70 - 99 mg/dL 106(H)  BUN 6 - 20 mg/dL 12  Creatinine 0.61 - 1.24 mg/dL 0.92  Sodium 135 - 145 mmol/L 135  Potassium 3.5 - 5.1 mmol/L 4.0  Chloride 98 - 111 mmol/L 98  CO2 22 - 32 mmol/L 25  Calcium 8.9 - 10.3 mg/dL 8.9  Total Protein 6.5 - 8.1 g/dL 6.7  Total Bilirubin 0.3 - 1.2 mg/dL 1.0  Alkaline Phos 38 - 126 U/L 146(H)  AST 15 - 41 U/L 32  ALT 0 - 44 U/L 19   CBC Latest Ref Rng & Units 04/08/2021  WBC 4.0 - 10.5 K/uL 9.3  Hemoglobin 13.0 - 17.0 g/dL 13.4  Hematocrit 39.0 - 52.0 % 39.9  Platelets 150 - 400 K/uL 105(L)    No images are attached to the encounter.  PERIPHERAL VASCULAR CATHETERIZATION  Result Date: 04/03/2021 See op note  [place] CORE BIOPSY (LYMPH NODES)  Result Date: 03/19/2021 INDICATION: History of grade 1 follicular lymphoma with prior right axillary lymph node biopsy on 07/25/2020. Imaging evidence by CT and PET scan of probable progression of lymphoma and need for additional lymph node biopsy to assess status of disease. EXAM: ULTRASOUND GUIDED CORE BIOPSY OF RIGHT INGUINAL LYMPH NODE MEDICATIONS: None. ANESTHESIA/SEDATION: Fentanyl 100 mcg IV; Versed 2.0 mg IV Moderate Sedation Time:  20 minutes. The patient was continuously monitored during the procedure by the interventional radiology nurse under my direct supervision. PROCEDURE: The procedure, risks, benefits, and alternatives were explained to the patient. Questions regarding the procedure were encouraged and answered. The patient understands and consents to the  procedure. A time-out was performed prior to initiating the procedure. Bilateral inguinal regions were initially scanned by ultrasound to localize lymphadenopathy. The right groin was prepped with chlorhexidine in a sterile fashion, and a sterile drape was applied covering the operative field. A sterile gown and sterile gloves were used for the procedure. Local anesthesia was provided with 1% Lidocaine. Under ultrasound guidance, 16 gauge core biopsy samples were obtained at the level of an enlarged inferior right inguinal lymph node. Core biopsy samples were submitted on saline soaked Telfa gauze to Pathology. COMPLICATIONS: None immediate. FINDINGS: Multiple enlarged bilateral inguinal lymph nodes are identified. The most accessible in the right groin was a superficial lymph node located in the inferior inguinal regions/upper medial thigh. This lymph node measures approximately 3.8 x 1.4 x 2.6 cm. Solid core biopsy samples were obtained from different areas of the cortex of the lymph node. IMPRESSION: Ultrasound-guided core biopsy performed of an enlarged right inguinal lymph node. Electronically Signed   By: Aletta Edouard M.D.   On: 03/19/2021 11:54    Assessment and plan- Patient is a 60 y.o. male with lymphoma, currently receiving chemotherapy, who [facility] for [facility]id 19 infection.   1. Covid 19 Infection- high risk of severe illness based on medical comorbidities. Vaccinated and boosted. 7 days since symptom onset. Outside of window for use of antivirals or mabs. Recommend supportive care.  Optimize hycodan. Refill today. Add dextromethorphan and guaifenesin. Monitor spo2 levels.  Advised that symptoms may last for weeks to months but should gradually improve.  If worsening of symptoms, recommend evaluation in post-COVID care clinic or ER depending on severity.  Recommend 21 days quarantine from cancer center given immunocompromise. Follow up scheduled. Encouraged strict mask precautions.     Visit Diagnosis 1. COVID-19 virus infection    Patient  expressed understanding and was in agreement with this plan. He also understands that He can call clinic at any time with any questions, concerns, or complaints.   I discussed the assessment and treatment plan with the patient. The patient was provided an opportunity to ask questions and all were answered. The patient agreed with the plan and demonstrated an understanding of the instructions.   The patient was advised to call back or seek an in-person evaluation if the symptoms worsen or if the condition fails to improve as anticipated.   I spent 12 minutes on this telephone encounter.   Thank you for allowing me to participate in the care of this very pleasant patient.   Beckey Rutter, DNP, AGNP-C [facility]

## 2021-04-17 NOTE — Telephone Encounter (Signed)
Josh/ lauren- please look into this

## 2021-04-17 NOTE — Telephone Encounter (Signed)
Patient called reporting that he is still having coughing spells at night (producing white sputum) as well as dizziness and weakness and sometimes feels as if he is having convulsions. He requests something to be done. Please advise

## 2021-04-17 NOTE — Telephone Encounter (Signed)
Spoke to patient. Please add virtual visit. He is on his 7th day of symptoms and therefore outside window for oral antivirals. We discussed symptomatic support though. I'll add a note. He has follow up scheduled.

## 2021-04-17 NOTE — Telephone Encounter (Signed)
Urgent care--no appts

## 2021-04-18 ENCOUNTER — Inpatient Hospital Stay: Payer: Medicare HMO

## 2021-04-18 ENCOUNTER — Inpatient Hospital Stay: Payer: Medicare HMO | Admitting: Oncology

## 2021-04-21 ENCOUNTER — Ambulatory Visit: Payer: Medicare HMO | Admitting: Dermatology

## 2021-04-22 ENCOUNTER — Other Ambulatory Visit: Payer: Self-pay

## 2021-04-22 ENCOUNTER — Other Ambulatory Visit: Payer: Medicare HMO

## 2021-04-22 DIAGNOSIS — E291 Testicular hypofunction: Secondary | ICD-10-CM

## 2021-04-23 ENCOUNTER — Other Ambulatory Visit: Payer: Self-pay | Admitting: Oncology

## 2021-04-23 DIAGNOSIS — C8202 Follicular lymphoma grade I, intrathoracic lymph nodes: Secondary | ICD-10-CM

## 2021-04-23 LAB — TESTOSTERONE: Testosterone: 432 ng/dL (ref 264–916)

## 2021-04-24 ENCOUNTER — Telehealth: Payer: Self-pay | Admitting: *Deleted

## 2021-04-24 ENCOUNTER — Other Ambulatory Visit (HOSPITAL_COMMUNITY): Payer: Self-pay

## 2021-04-24 NOTE — Telephone Encounter (Signed)
Call returned to patient and informed of physician response. Th thanked me for calling back and said that he thinks he may also try some lidocaine patches as well on them

## 2021-04-24 NOTE — Telephone Encounter (Signed)
Patient called reporting that he awoke this morning with severe bilateral knee pain. He denies swelling, redness or heat to the areas. He states it is difficult to ambulate due to the pain. He is not aware of any injuries to himself. He is asking what to do for it. Please advise

## 2021-04-24 NOTE — Telephone Encounter (Signed)
Rest and tylenol. 2-3 doses of advil prn. Call next week if no improvement. F/u as scheduled. 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

## 2021-04-29 ENCOUNTER — Telehealth: Payer: Self-pay | Admitting: *Deleted

## 2021-04-29 NOTE — Telephone Encounter (Addendum)
Anderson Malta, could you please schedule patient for [facility] tomorrow and let him know time

## 2021-04-29 NOTE — Telephone Encounter (Signed)
Possibly see [facility] tomorrow. I am in [place] tomorrow so I think it would be Gennaro Africa, NP 04/29/2021 2:54 PM

## 2021-04-29 NOTE — Telephone Encounter (Signed)
Patient called reporting that he has broken out on his feet, ankles moved up to his legs and shest and is now going to his face. He is ashamed to go out and so he is not going out and states he cannot wear shorts either because of the "hive like rash" He is asking what is causing it and what to do for it. Please advise

## 2021-04-30 ENCOUNTER — Inpatient Hospital Stay: Payer: Medicare HMO | Attending: Hospice and Palliative Medicine | Admitting: Hospice and Palliative Medicine

## 2021-04-30 ENCOUNTER — Encounter: Payer: Self-pay | Admitting: Oncology

## 2021-04-30 DIAGNOSIS — Z5112 Encounter for antineoplastic immunotherapy: Secondary | ICD-10-CM | POA: Insufficient documentation

## 2021-04-30 DIAGNOSIS — Z7982 Long term (current) use of aspirin: Secondary | ICD-10-CM | POA: Insufficient documentation

## 2021-04-30 DIAGNOSIS — D696 Thrombocytopenia, unspecified: Secondary | ICD-10-CM | POA: Insufficient documentation

## 2021-04-30 DIAGNOSIS — Z7951 Long term (current) use of inhaled steroids: Secondary | ICD-10-CM | POA: Insufficient documentation

## 2021-04-30 DIAGNOSIS — Z7952 Long term (current) use of systemic steroids: Secondary | ICD-10-CM | POA: Insufficient documentation

## 2021-04-30 DIAGNOSIS — Z5111 Encounter for antineoplastic chemotherapy: Secondary | ICD-10-CM | POA: Insufficient documentation

## 2021-04-30 DIAGNOSIS — I10 Essential (primary) hypertension: Secondary | ICD-10-CM | POA: Insufficient documentation

## 2021-04-30 DIAGNOSIS — C8200 Follicular lymphoma grade I, unspecified site: Secondary | ICD-10-CM | POA: Insufficient documentation

## 2021-04-30 DIAGNOSIS — Z79899 Other long term (current) drug therapy: Secondary | ICD-10-CM | POA: Insufficient documentation

## 2021-04-30 DIAGNOSIS — F17210 Nicotine dependence, cigarettes, uncomplicated: Secondary | ICD-10-CM | POA: Insufficient documentation

## 2021-05-01 ENCOUNTER — Ambulatory Visit (INDEPENDENT_AMBULATORY_CARE_PROVIDER_SITE_OTHER): Payer: Medicare HMO | Admitting: Dermatology

## 2021-05-01 ENCOUNTER — Telehealth: Payer: Self-pay | Admitting: *Deleted

## 2021-05-01 ENCOUNTER — Other Ambulatory Visit: Payer: Self-pay

## 2021-05-01 DIAGNOSIS — R21 Rash and other nonspecific skin eruption: Secondary | ICD-10-CM

## 2021-05-01 DIAGNOSIS — Z8616 Personal history of COVID-19: Secondary | ICD-10-CM | POA: Diagnosis not present

## 2021-05-01 DIAGNOSIS — S1180XA Unspecified open wound of other specified part of neck, initial encounter: Secondary | ICD-10-CM

## 2021-05-01 DIAGNOSIS — T148XXA Other injury of unspecified body region, initial encounter: Secondary | ICD-10-CM

## 2021-05-01 DIAGNOSIS — C8590 Non-Hodgkin lymphoma, unspecified, unspecified site: Secondary | ICD-10-CM | POA: Diagnosis not present

## 2021-05-01 DIAGNOSIS — L905 Scar conditions and fibrosis of skin: Secondary | ICD-10-CM | POA: Diagnosis not present

## 2021-05-01 MED ORDER — SULFAMETHOXAZOLE-TRIMETHOPRIM 800-160 MG PO TABS: 1.0000 | ORAL_TABLET | Freq: Two times a day (BID) | ORAL | 0 refills | Status: AC

## 2021-05-01 NOTE — Progress Notes (Signed)
   Follow-Up Visit   Subjective  Russell Simpson is a 60 y.o. male who presents for the following: Rash (Patient advises he developed a rash all over on Saturday. Rash does not itch. He did take some Benadryl but it did not help at all. Patient recently diagnosed with Stage 4 Non Hodgkins Lymphoma, first chemo treatment was May 10th. He also was diagnosed with Covid May 13th. ).  Rash started at feet and lower legs and has continued to spread up trunk and arms. He has also had a dry cough for a few days. Rash is not itchy, not painful.   Patient also concerned about a SCC at left inf mandible that was treated with [facility] at time of biopsy, 02/19/21 by Dr. Nicole Kindred. He feels like it is taking a long time to heal. He also had an ISK treated with LN2 at left malar cheek that has not cleared as well as a new spot at left forearm.   The following portions of the chart were reviewed this encounter and updated as appropriate:   Tobacco  Allergies  Meds  Problems  Med Hx  Surg Hx  Fam Hx       Review of Systems:  No other skin or systemic complaints except as noted in HPI or Assessment and Plan.  Objective  Well appearing patient in no apparent distress; mood and affect are within normal limits.  A focused examination was performed including face, neck, chest and back and arms, legs. Relevant physical exam findings are noted in the Assessment and Plan.  trunk, extremities Scaly red scattered papules at arms widespread coalescing  Red macules and papules symmetric coalescing   Left Anterior Neck Tender to palpation with central crusting, partially undermined borders  Left Malar Cheek Small depression without suspicious features on dermoscopy   Assessment & Plan  Rash trunk, extremities  Morbilliform eruption ddx viral examthem > drug or other Favor viral given asymptomatic rash plus cough Consider biopsy if not resolved in a month Call for worsening   Patient deferred treatment at  this time.   Open wound Left Anterior Neck  Ddx includes recurrent SCC vs infection. Purulent drainage on exam today.  Start Bactrim DS twice daily x 7 days Plan biopsy at left neck if not dramatically improved/clear in 1 week Culture today  Related Procedures Anaerobic and Aerobic Culture  Scar conditions and fibrosis of skin Left Malar Cheek  Exam most consistent with small depressed scar. No features of BCC appreciated on dermoscopic exam today.  Recommend Serica moisturizing scar formula cream every night or [facility] brand or Mederma silicone scar sheet every night for the first year after a scar appears to help with scar remodeling if desired. Scars remodel on their own for a full year.  Call for changes or worsening  Recommend daily broad spectrum sunscreen SPF 30+ to sun-exposed areas, reapply every 2 hours as needed. Call for new or changing lesions.  Staying in the shade or wearing long sleeves, sun glasses (UVA+UVB protection) and wide brim hats (4-inch brim around the entire circumference of the hat) are also recommended for sun protection.    Return in about 1 week (around 05/08/2021) for wound follow up.  Graciella Belton, RMA, am acting as scribe for Forest Gleason, MD .  Documentation: I have reviewed the above documentation for accuracy and completeness, and I agree with the above.  Forest Gleason, MD

## 2021-05-01 NOTE — Telephone Encounter (Signed)
Patient called me as requested to let me know that the Dermatologist said that his rash is a viral rash and put him on "Sulfa Meth Ox" and want him to come back for recheck next Thursday

## 2021-05-01 NOTE — Patient Instructions (Signed)
[facility] brand or Mederma silicone scar sheet every night for the first year after a scar appears to help with scar remodeling if desired. Scars remodel on their own for a full year.  Call for changes or worsening  Recommend daily broad spectrum sunscreen SPF 30+ to sun-exposed areas, reapply every 2 hours as needed. Call for new or changing lesions.  Staying in the shade or wearing long sleeves, sun glasses (UVA+UVB protection) and wide brim hats (4-inch brim around the entire circumference of the hat) are also recommended for sun protection.   Start Bactrim DS twice daily for 7 days.   If you have any questions or concerns for your doctor, please call our main line at [phone] and press option 4 to reach your doctor's medical assistant. If no one answers, please leave a voicemail as directed and we will return your call as soon as possible. Messages left after 4 pm will be answered the following business day.   You may also send [place] a message via [place]. We typically respond to MyChart messages within 1-2 business days.  For prescription refills, please ask your pharmacy to contact our office. Our fax number is [phone].  If you have an urgent issue when the clinic is closed that cannot wait until the next business day, you can page your doctor at the number below.    Please note that while we do our best to be available for urgent issues outside of office hours, we are not available 24/7.   If you have an urgent issue and are unable to reach [place], you may choose to seek medical care at your doctor's office, retail clinic, urgent care center, or emergency room.  If you have a medical emergency, please immediately call 911 or go to the emergency department.  Pager Numbers  - Dr. Nehemiah Massed: [phone]  - Dr. Laurence Ferrari: [phone]  - Dr. Nicole Kindred: [phone]  In the event of inclement weather, please call our main line at [phone] for an update on the status of any delays or  closures.  Dermatology Medication Tips: Please keep the boxes that topical medications come in in order to help keep track of the instructions about where and how to use these. Pharmacies typically print the medication instructions only on the boxes and not directly on the medication tubes.   If your medication is too expensive, please contact our office at [phone] option 4 or send [place] a message through [place].   We are unable to tell what your co-pay for medications will be in advance as this is different depending on your insurance coverage. However, we may be able to find a substitute medication at lower cost or fill out paperwork to get insurance to cover a needed medication.   If a prior authorization is required to get your medication covered by your insurance company, please allow [place] 1-2 business days to complete this process.  Drug prices often vary depending on where the prescription is filled and some pharmacies may offer cheaper prices.  The website www.goodrx.com contains coupons for medications through different pharmacies. The prices here do not account for what the cost may be with help from insurance (it may be cheaper with your insurance), but the website can give you the price if you did not use any insurance.  - You can print the associated coupon and take it with your prescription to the pharmacy.  - You may also stop by our office during regular business hours and pick up a  GoodRx coupon card.  - If you need your prescription sent electronically to a different pharmacy, notify our office through [facility] or by phone at [phone] option 4.

## 2021-05-01 NOTE — Telephone Encounter (Signed)
thanks

## 2021-05-06 ENCOUNTER — Inpatient Hospital Stay: Payer: Medicare HMO

## 2021-05-06 ENCOUNTER — Encounter: Payer: Self-pay | Admitting: Internal Medicine

## 2021-05-06 ENCOUNTER — Inpatient Hospital Stay (HOSPITAL_BASED_OUTPATIENT_CLINIC_OR_DEPARTMENT_OTHER): Payer: Medicare HMO | Admitting: Internal Medicine

## 2021-05-06 VITALS — BP 123/79 | HR 100 | Temp 97.9°F | Resp 18

## 2021-05-06 DIAGNOSIS — Z5111 Encounter for antineoplastic chemotherapy: Secondary | ICD-10-CM | POA: Diagnosis not present

## 2021-05-06 DIAGNOSIS — I10 Essential (primary) hypertension: Secondary | ICD-10-CM | POA: Diagnosis not present

## 2021-05-06 DIAGNOSIS — C8202 Follicular lymphoma grade I, intrathoracic lymph nodes: Secondary | ICD-10-CM

## 2021-05-06 DIAGNOSIS — D696 Thrombocytopenia, unspecified: Secondary | ICD-10-CM

## 2021-05-06 DIAGNOSIS — C8200 Follicular lymphoma grade I, unspecified site: Secondary | ICD-10-CM | POA: Diagnosis not present

## 2021-05-06 DIAGNOSIS — Z7952 Long term (current) use of systemic steroids: Secondary | ICD-10-CM | POA: Diagnosis not present

## 2021-05-06 DIAGNOSIS — Z7951 Long term (current) use of inhaled steroids: Secondary | ICD-10-CM | POA: Diagnosis not present

## 2021-05-06 DIAGNOSIS — Z5112 Encounter for antineoplastic immunotherapy: Secondary | ICD-10-CM | POA: Diagnosis not present

## 2021-05-06 DIAGNOSIS — F17210 Nicotine dependence, cigarettes, uncomplicated: Secondary | ICD-10-CM | POA: Diagnosis not present

## 2021-05-06 DIAGNOSIS — Z7982 Long term (current) use of aspirin: Secondary | ICD-10-CM | POA: Diagnosis not present

## 2021-05-06 DIAGNOSIS — Z79899 Other long term (current) drug therapy: Secondary | ICD-10-CM | POA: Diagnosis not present

## 2021-05-06 LAB — COMPREHENSIVE METABOLIC PANEL
ALT: 22 U/L (ref 0–44)
AST: 19 U/L (ref 15–41)
Albumin: 3.3 g/dL — ABNORMAL LOW (ref 3.5–5.0)
Alkaline Phosphatase: 100 U/L (ref 38–126)
Anion gap: 10 (ref 5–15)
BUN: 12 mg/dL (ref 6–20)
CO2: 24 mmol/L (ref 22–32)
Calcium: 8.2 mg/dL — ABNORMAL LOW (ref 8.9–10.3)
Chloride: 96 mmol/L — ABNORMAL LOW (ref 98–111)
Creatinine, Ser: 0.86 mg/dL (ref 0.61–1.24)
GFR, Estimated: >60 mL/min (ref >60)
Glucose, Bld: 162 mg/dL — ABNORMAL HIGH (ref 70–99)
Potassium: 4.4 mmol/L (ref 3.5–5.1)
Sodium: 130 mmol/L — ABNORMAL LOW (ref 135–145)
Total Bilirubin: 0.8 mg/dL (ref 0.3–1.2)
Total Protein: 5.7 g/dL — ABNORMAL LOW (ref 6.5–8.1)

## 2021-05-06 LAB — CBC WITH DIFFERENTIAL/PLATELET
Abs Immature Granulocytes: 0.03 10*3/uL (ref 0.00–0.07)
Basophils Absolute: 0 10*3/uL (ref 0.0–0.1)
Basophils Relative: 1 %
Eosinophils Absolute: 0.4 10*3/uL (ref 0.0–0.5)
Eosinophils Relative: 9 %
HCT: 35.6 % — ABNORMAL LOW (ref 39.0–52.0)
Hemoglobin: 12.6 g/dL — ABNORMAL LOW (ref 13.0–17.0)
Immature Granulocytes: 1 %
Lymphocytes Relative: 33 %
Lymphs Abs: 1.4 10*3/uL (ref 0.7–4.0)
MCH: 32.5 pg (ref 26.0–34.0)
MCHC: 35.4 g/dL (ref 30.0–36.0)
MCV: 91.8 fL (ref 80.0–100.0)
Monocytes Absolute: 0.4 10*3/uL (ref 0.1–1.0)
Monocytes Relative: 9 %
Neutro Abs: 2 10*3/uL (ref 1.7–7.7)
Neutrophils Relative %: 47 %
Platelets: 82 10*3/uL — ABNORMAL LOW (ref 150–400)
RBC: 3.88 MIL/uL — ABNORMAL LOW (ref 4.22–5.81)
RDW: 13.8 % (ref 11.5–15.5)
Smear Review: NORMAL
WBC: 4.2 10*3/uL (ref 4.0–10.5)
nRBC: 0 % (ref 0.0–0.2)

## 2021-05-06 LAB — LACTATE DEHYDROGENASE: LDH: 104 U/L (ref 98–192)

## 2021-05-06 MED ORDER — SODIUM CHLORIDE 0.9 % IV SOLN
Freq: Once | INTRAVENOUS | Status: AC
  Filled 2021-05-06: qty 250

## 2021-05-06 MED ORDER — MONTELUKAST SODIUM 10 MG PO TABS
10.0000 mg | ORAL_TABLET | Freq: Once | ORAL | Status: AC
  Administered 2021-05-06: 10 mg via ORAL
  Filled 2021-05-06: qty 1

## 2021-05-06 MED ORDER — PALONOSETRON HCL INJECTION 0.25 MG/5ML
0.2500 mg | Freq: Once | INTRAVENOUS | Status: AC
  Administered 2021-05-06: 0.25 mg via INTRAVENOUS
  Filled 2021-05-06: qty 5

## 2021-05-06 MED ORDER — ACETAMINOPHEN 325 MG PO TABS
650.0000 mg | ORAL_TABLET | Freq: Once | ORAL | Status: AC
  Administered 2021-05-06: 650 mg via ORAL
  Filled 2021-05-06: qty 2

## 2021-05-06 MED ORDER — SODIUM CHLORIDE 0.9% FLUSH
10.0000 mL | INTRAVENOUS | Status: DC | PRN
  Administered 2021-05-06: 10 mL
  Filled 2021-05-06: qty 10

## 2021-05-06 MED ORDER — HEPARIN SOD (PORK) LOCK FLUSH 100 UNIT/ML IV SOLN
INTRAVENOUS | Status: AC
  Filled 2021-05-06: qty 5

## 2021-05-06 MED ORDER — FAMOTIDINE 20 MG IN NS 100 ML IVPB
20.0000 mg | Freq: Once | INTRAVENOUS | Status: AC
  Administered 2021-05-06: 20 mg via INTRAVENOUS
  Filled 2021-05-06: qty 20

## 2021-05-06 MED ORDER — HEPARIN SOD (PORK) LOCK FLUSH 100 UNIT/ML IV SOLN
500.0000 [IU] | Freq: Once | INTRAVENOUS | Status: AC | PRN
  Administered 2021-05-06: 500 [IU]
  Filled 2021-05-06: qty 5

## 2021-05-06 MED ORDER — DIPHENHYDRAMINE HCL 50 MG/ML IJ SOLN
50.0000 mg | Freq: Once | INTRAMUSCULAR | Status: AC | PRN
  Administered 2021-05-06: 25 mg via INTRAVENOUS

## 2021-05-06 MED ORDER — METHYLPREDNISOLONE SODIUM SUCC 125 MG IJ SOLR
125.0000 mg | Freq: Once | INTRAMUSCULAR | Status: AC | PRN
  Administered 2021-05-06: 125 mg via INTRAVENOUS

## 2021-05-06 MED ORDER — SODIUM CHLORIDE 0.9 % IV SOLN
90.0000 mg/m2 | Freq: Once | INTRAVENOUS | Status: AC
  Administered 2021-05-06: 225 mg via INTRAVENOUS
  Filled 2021-05-06: qty 9

## 2021-05-06 MED ORDER — METHYLPREDNISOLONE SODIUM SUCC 125 MG IJ SOLR
125.0000 mg | Freq: Once | INTRAMUSCULAR | Status: AC
  Administered 2021-05-06: 125 mg via INTRAVENOUS

## 2021-05-06 MED ORDER — DIPHENHYDRAMINE HCL 50 MG/ML IJ SOLN
25.0000 mg | Freq: Once | INTRAMUSCULAR | Status: AC
  Administered 2021-05-06: 25 mg via INTRAVENOUS

## 2021-05-06 MED ORDER — SODIUM CHLORIDE 0.9 % IV SOLN
10.0000 mg | Freq: Once | INTRAVENOUS | Status: AC
  Administered 2021-05-06: 10 mg via INTRAVENOUS
  Filled 2021-05-06: qty 10

## 2021-05-06 MED ORDER — SODIUM CHLORIDE 0.9 % IV SOLN
375.0000 mg/m2 | Freq: Once | INTRAVENOUS | Status: AC
  Administered 2021-05-06: 900 mg via INTRAVENOUS
  Filled 2021-05-06: qty 50

## 2021-05-06 MED ORDER — FAMOTIDINE 20 MG IN NS 100 ML IVPB
20.0000 mg | Freq: Once | INTRAVENOUS | Status: AC | PRN
  Administered 2021-05-06: 20 mg via INTRAVENOUS
  Filled 2021-05-06: qty 100

## 2021-05-06 MED ORDER — DIPHENHYDRAMINE HCL 25 MG PO CAPS
50.0000 mg | ORAL_CAPSULE | Freq: Once | ORAL | Status: AC
Start: 2021-05-06 — End: 2021-05-06
  Administered 2021-05-06: 50 mg via ORAL
  Filled 2021-05-06: qty 2

## 2021-05-06 MED ORDER — SODIUM CHLORIDE 0.9 % IV SOLN
Freq: Once | INTRAVENOUS | Status: DC | PRN
  Filled 2021-05-06: qty 250

## 2021-05-06 NOTE — Progress Notes (Signed)
Pt c/o knee pain 5/10 took an ibuprofen this morning has been going on and off for about two weeks.

## 2021-05-06 NOTE — Patient Instructions (Signed)
[facility]    Discharge Instructions:  Thank you for choosing [facility] to provide your oncology and hematology care.  If you have a lab appointment with the [facility], please go directly to the [facility] and check in at the registration area.  Wear comfortable clothing and clothing appropriate for easy access to any Portacath or PICC line.   We strive to give you quality time with your provider. You may need to reschedule your appointment if you arrive late (15 or more minutes).  Arriving late affects you and other patients whose appointments are after yours.  Also, if you miss three or more appointments without notifying the office, you may be dismissed from the clinic at the provider's discretion.      For prescription refill requests, have your pharmacy contact our office and allow 72 hours for refills to be completed.    Today you received the following chemotherapy and/or immunotherapy agents: Rituximab-pvvr (Ruxience), Bendamustine (Bendeka).      To help prevent nausea and vomiting after your treatment, we encourage you to take your nausea medication as directed.  BELOW ARE SYMPTOMS THAT SHOULD BE REPORTED IMMEDIATELY: . *FEVER GREATER THAN 100.4 F (38 C) OR HIGHER . *CHILLS OR SWEATING . *NAUSEA AND VOMITING THAT IS NOT CONTROLLED WITH YOUR NAUSEA MEDICATION . *UNUSUAL SHORTNESS OF BREATH . *UNUSUAL BRUISING OR BLEEDING . *URINARY PROBLEMS (pain or burning when urinating, or frequent urination) . *BOWEL PROBLEMS (unusual diarrhea, constipation, pain near the anus) . TENDERNESS IN MOUTH AND THROAT WITH OR WITHOUT PRESENCE OF ULCERS (sore throat, sores in mouth, or a toothache) . UNUSUAL RASH, SWELLING OR PAIN  . UNUSUAL VAGINAL DISCHARGE OR ITCHING   Items with * indicate a potential emergency and should be followed up as soon as possible or go to the Emergency Department if any problems should occur.  Please show  the CHEMOTHERAPY ALERT CARD or IMMUNOTHERAPY ALERT CARD at check-in to the Emergency Department and triage nurse.  Should you have questions after your visit or need to cancel or reschedule your appointment, please contact [facility]  [phone] and follow the prompts.  Office hours are 8:00 a.m. to 4:30 p.m. Monday - Friday. Please note that voicemails left after 4:00 p.m. may not be returned until the following business day.  We are closed weekends and major holidays. You have access to a nurse at all times for urgent questions. Please call the main number to the clinic [phone] and follow the prompts.  For any non-urgent questions, you may also contact your provider using MyChart. We now offer e-Visits for anyone 37 and older to request care online for non-urgent symptoms. For details visit [email or web address].   Also download the MyChart app! Go to the app store, search "MyChart", open the app, select [facility], and log in with your MyChart username and password.  Due to Covid, a mask is required upon entering the hospital/clinic. If you do not have a mask, one will be given to you upon arrival. For doctor visits, patients may have 1 support person aged 20 or older with them. For treatment visits, patients cannot have anyone with them due to current Covid guidelines and our immunocompromised population.   Rituximab Injection  What is this medicine? RITUXIMAB (ri TUX i mab) is a monoclonal antibody. It is used to treat certain types of cancer like non-Hodgkin lymphoma and chronic lymphocytic leukemia. It is also used to treat rheumatoid  arthritis, granulomatosis with polyangiitis, microscopic polyangiitis, and pemphigus vulgaris. This medicine may be used for other purposes; ask your health care provider or pharmacist if you have questions. COMMON BRAND NAME(S): RIABNI, Rituxan, RUXIENCE What should I tell my health care provider before I take this  medicine? They need to know if you have any of these conditions:  chest pain  heart disease  infection especially a viral infection such as chickenpox, cold sores, hepatitis B, or herpes  immune system problems  irregular heartbeat or rhythm  kidney disease  low blood counts (white cells, platelets, or red cells)  lung disease  recent or upcoming vaccine  an unusual or allergic reaction to rituximab, other medicines, foods, dyes, or preservatives  pregnant or trying to get pregnant  breast-feeding How should I use this medicine? This medicine is injected into a vein. It is given by a health care provider in a hospital or clinic setting. A special MedGuide will be given to you before each treatment. Be sure to read this information carefully each time. Talk to your health care provider about the use of this medicine in children. While this drug may be prescribed for children as young as 2 years for selected conditions, precautions do apply. Overdosage: If you think you have taken too much of this medicine contact a poison control center or emergency room at once. NOTE: This medicine is only for you. Do not share this medicine with others. What if I miss a dose? Keep appointments for follow-up doses. It is important not to miss your dose. Call your health care provider if you are unable to keep an appointment. What may interact with this medicine? Do not take this medicine with any of the following medicines:  live vaccines This medicine may also interact with the following medicines:  cisplatin This list may not describe all possible interactions. Give your health care provider a list of all the medicines, herbs, non-prescription drugs, or dietary supplements you use. Also tell them if you smoke, drink alcohol, or use illegal drugs. Some items may interact with your medicine. What should I watch for while using this medicine? Your condition will be monitored carefully while  you are receiving this medicine. You may need blood work done while you are taking this medicine. This medicine can cause serious infusion reactions. To reduce the risk your health care provider may give you other medicines to take before receiving this one. Be sure to follow the directions from your health care provider. This medicine may increase your risk of getting an infection. Call your health care provider for advice if you get a fever, chills, sore throat, or other symptoms of a cold or flu. Do not treat yourself. Try to avoid being around people who are sick. Call your health care provider if you are around anyone with measles, chickenpox, or if you develop sores or blisters that do not heal properly. Avoid taking medicines that contain aspirin, acetaminophen, ibuprofen, naproxen, or ketoprofen unless instructed by your health care provider. These medicines may hide a fever. This medicine may cause serious skin reactions. They can happen weeks to months after starting the medicine. Contact your health care provider right away if you notice fevers or flu-like symptoms with a rash. The rash may be red or purple and then turn into blisters or peeling of the skin. Or, you might notice a red rash with swelling of the face, lips or lymph nodes in your neck or under your arms. In some  patients, this medicine may cause a serious brain infection that may cause death. If you have any problems seeing, thinking, speaking, walking, or standing, tell your healthcare professional right away. If you cannot reach your healthcare professional, urgently seek other source of medical care. Do not become pregnant while taking this medicine or for at least 12 months after stopping it. Women should inform their health care provider if they wish to become pregnant or think they might be pregnant. There is potential for serious harm to an unborn child. Talk to your health care provider for more information. Women should use a  reliable form of birth control while taking this medicine and for 12 months after stopping it. Do not breast-feed while taking this medicine or for at least 6 months after stopping it. What side effects may I notice from receiving this medicine? Side effects that you should report to your health care provider as soon as possible:  allergic reactions (skin rash, itching or hives; swelling of the face, lips, or tongue)  diarrhea  edema (sudden weight gain; swelling of the ankles, feet, hands or other unusual swelling; trouble breathing)  fast, irregular heartbeat  heart attack (trouble breathing; pain or tightness in the chest, neck, back or arms; unusually weak or tired)  infection (fever, chills, cough, sore throat, pain or trouble passing urine)  kidney injury (trouble passing urine or change in the amount of urine)  liver injury (dark yellow or brown urine; general ill feeling or flu-like symptoms; loss of appetite, right upper belly pain; unusually weak or tired, yellowing of the eyes or skin)  low blood pressure (dizziness; feeling faint or lightheaded, falls; unusually weak or tired)  low red blood cell counts (trouble breathing; feeling faint; lightheaded, falls; unusually weak or tired)  mouth sores  redness, blistering, peeling, or loosening of the skin, including inside the mouth  stomach pain  unusual bruising or bleeding  wheezing (trouble breathing with loud or whistling sounds)  vomiting Side effects that usually do not require medical attention (report to your health care provider if they continue or are bothersome):  headache  joint pain  muscle cramps, pain  nausea This list may not describe all possible side effects. Call your doctor for medical advice about side effects. You may report side effects to FDA at 1-800-FDA-1088. Where should I keep my medicine? This medicine is given in a hospital or clinic. It will not be stored at home. NOTE: This sheet  is a summary. It may not cover all possible information. If you have questions about this medicine, talk to your doctor, pharmacist, or health care provider.  2021 Elsevier/Gold Standard (2020-08-29 21:35:50)  Bendamustine Injection  What is this medicine? BENDAMUSTINE (BEN da MUS teen) is a chemotherapy drug. It is used to treat chronic lymphocytic leukemia and non-Hodgkin lymphoma. This medicine may be used for other purposes; ask your health care provider or pharmacist if you have questions. COMMON BRAND NAME(S): Kristine Royal, Treanda What should I tell my health care provider before I take this medicine? They need to know if you have any of these conditions:  infection (especially a virus infection such as chickenpox, cold sores, or herpes)  kidney disease  liver disease  an unusual or allergic reaction to bendamustine, mannitol, other medicines, foods, dyes, or preservatives  pregnant or trying to get pregnant  breast-feeding How should I use this medicine? This medicine is for infusion into a vein. It is given by a health care professional in a  hospital or clinic setting. Talk to your pediatrician regarding the use of this medicine in children. Special care may be needed. Overdosage: If you think you have taken too much of this medicine contact a poison control center or emergency room at once. NOTE: This medicine is only for you. Do not share this medicine with others. What if I miss a dose? It is important not to miss your dose. Call your doctor or health care professional if you are unable to keep an appointment. What may interact with this medicine? Do not take this medicine with any of the following medications:  clozapine This medicine may also interact with the following medications:  atazanavir  cimetidine  ciprofloxacin  enoxacin  fluvoxamine  medicines for seizures like carbamazepine and  phenobarbital  mexiletine  rifampin  tacrine  thiabendazole  zileuton This list may not describe all possible interactions. Give your health care provider a list of all the medicines, herbs, non-prescription drugs, or dietary supplements you use. Also tell them if you smoke, drink alcohol, or use illegal drugs. Some items may interact with your medicine. What should I watch for while using this medicine? This drug may make you feel generally unwell. This is not uncommon, as chemotherapy can affect healthy cells as well as cancer cells. Report any side effects. Continue your course of treatment even though you feel ill unless your doctor tells you to stop. You may need blood work done while you are taking this medicine. Call your doctor or healthcare provider for advice if you get a fever, chills or sore throat, or other symptoms of a cold or flu. Do not treat yourself. This drug decreases your body's ability to fight infections. Try to avoid being around people who are sick. This medicine may cause serious skin reactions. They can happen weeks to months after starting the medicine. Contact your healthcare provider right away if you notice fevers or flu-like symptoms with a rash. The rash may be red or purple and then turn into blisters or peeling of the skin. Or, you might notice a red rash with swelling of the face, lips or lymph nodes in your neck or under your arms. In some patients, this medicine may cause a serious brain infection that may cause death. If you have any problems seeing, thinking, speaking, walking, or standing, tell your health care provider right away. If you cannot reach your health care provider, urgently seek other source of medical care. This medicine may increase your risk to bruise or bleed. Call your doctor or healthcare provider if you notice any unusual bleeding. Talk to your doctor about your risk of cancer. You may be more at risk for certain types of cancers if you  take this medicine. This medicine may increase your risk of skin cancer. Check your skin for changes to moles or for new growths while taking this medicine. Call your health care provider if you notice any of these skin changes. Do not become pregnant while taking this medicine or for at least 6 months after stopping it. Women should inform their doctor if they wish to become pregnant or think they might be pregnant. Men should not father a child while taking this medicine and for at least 3 months after stopping it. There is a potential for serious side effects to an unborn child. Talk to your healthcare provider or pharmacist for more information. Do not breast-feed an infant while taking this medicine or for at least 1 week after stopping it.  This medicine may make it more difficult to father a child. You should talk with your doctor or healthcare provider if you are concerned about your fertility. What side effects may I notice from receiving this medicine? Side effects that you should report to your doctor or health care professional as soon as possible:  allergic reactions like skin rash, itching or hives, swelling of the face, lips, or tongue  low blood counts - this medicine may decrease the number of white blood cells, red blood cells and platelets. You may be at increased risk for infections and bleeding.  rash, fever, and swollen lymph nodes  redness, blistering, peeling, or loosening of the skin, including inside the mouth  signs of infection like fever or chills, cough, sore throat, pain or difficulty passing urine  signs of decreased platelets or bleeding like bruising, pinpoint red spots on the skin, black, tarry stools, blood in the urine  signs of decreased red blood cells like being unusually weak or tired, fainting spells, lightheadedness  signs and symptoms of kidney injury like trouble passing urine or change in the amount of urine  signs and symptoms of liver injury like  dark yellow or brown urine; general ill feeling or flu-like symptoms; light-colored stools; loss of appetite; nausea; right upper belly pain; unusually weak or tired; yellowing of the eyes or skin Side effects that usually do not require medical attention (report to your doctor or health care professional if they continue or are bothersome):  constipation  decreased appetite  diarrhea  headache  mouth sores  nausea, vomiting  tiredness This list may not describe all possible side effects. Call your doctor for medical advice about side effects. You may report side effects to FDA at 1-800-FDA-1088. Where should I keep my medicine? This drug is given in a hospital or clinic and will not be stored at home. NOTE: This sheet is a summary. It may not cover all possible information. If you have questions about this medicine, talk to your doctor, pharmacist, or health care provider.  2021 Elsevier/Gold Standard (2020-05-13 12:11:43)

## 2021-05-06 NOTE — Progress Notes (Signed)
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  male history of follicular lymphoma currently on Bendamustine rituximab is here for follow-up.  Patient is here for cycle #1 approximately 4 weeks ago.  The patient had infusion reaction to rituximab however was able to finish rituximab the same day.  He notes slight improvement of the lymph nodes in his underarm/neck.  Complains of erectile dysfunction-wondering if related to lymphoma.   Review of Systems  Constitutional: Positive for diaphoresis and malaise/fatigue. Negative for chills, fever and weight loss.  HENT: Negative for nosebleeds and sore throat.   Eyes: Negative for double vision.  Respiratory: Negative for cough, hemoptysis, sputum production, shortness of breath and wheezing.   Cardiovascular: Negative for chest pain, palpitations, orthopnea and leg swelling.  Gastrointestinal: Negative for abdominal pain, blood in stool, constipation, diarrhea, heartburn, melena, nausea and vomiting.  Genitourinary: Negative for dysuria, frequency and urgency.  Musculoskeletal: Negative for  back pain and joint pain.  Skin: Negative.  Negative for itching and rash.  Neurological: Negative for dizziness, tingling, focal weakness, weakness and headaches.  Endo/Heme/Allergies: Does not bruise/bleed easily.  Psychiatric/Behavioral: Negative for depression. The patient is not nervous/anxious and does not have insomnia.      MEDICAL HISTORY:  Past Medical History:  Diagnosis Date  . 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 Arthritis   . Asthma   . Basal cell carcinoma 01/28/2021   R upper arm, ED[facility] 03/04/2021  . Brain tumor (benign) ([place])    benign pituitary neoplasm  . Chronic pain    right arm  . Depression   . Environmental and seasonal allergies   . Follicular lymphoma ([place])   . Hypertension   . Pneumonia   . Sleep apnea    does not wear CPAP ; uses humidifier instead  . Squamous cell carcinoma of skin 02/19/2021   L inferior mandible, treated with [facility]     SURGICAL HISTORY: Past Surgical History:  Procedure Laterality Date  . ANTERIOR CERVICAL DECOMP/DISCECTOMY FUSION N/A 06/17/2016   Procedure: ANTERIOR CERVICAL DECOMPRESSION FUSION, CERVICAL 3-4, CERVICAL 4-5 WITH INSTRUMENTATION AND ALLOGRAFT;  Surgeon: Phylliss Bob, MD;  Location: [facility];  Service: Orthopedics;  Laterality: N/A;  ANTERIOR CERVICAL DECOMPRESSION FUSION, CERVICAL 3-4, CERVICAL 4-5 WITH INSTRUMENTATION AND ALLOGRAFT  . BACK SURGERY    . NECK SURGERY  2009  . PORTA CATH INSERTION N/A 04/03/2021   Procedure: PORTA CATH INSERTION;  Surgeon: Algernon Huxley, MD;  Location: [facility];  Service: Cardiovascular;  Laterality: N/A;    SOCIAL HISTORY: Social History   Socioeconomic History  . Marital status: Divorced    Spouse name: Not on  file  . Number of children: Not on file  . Years of education: Not on file  . Highest education level: Not on file  Occupational History  . Not on file  Tobacco Use  . Smoking status: Current Every Day Smoker    Years: 20.00    Types: Cigars  . Smokeless  tobacco: Never Used  . Tobacco comment: 5-6 cigars a day  Vaping Use  . Vaping Use: Former  . Start date: 04/30/2017  . Quit date: 11/30/2017  Substance and Sexual Activity  . Alcohol use: Not Currently    Alcohol/week: 0.0 standard drinks  . Drug use: No  . Sexual activity: Not Currently  Other Topics Concern  . Not on file  Social History Narrative  . Not on file   Social Determinants of Health   Financial Resource Strain: Not on file  Food Insecurity: Not on file  Transportation Needs: Not on file  Physical Activity: Not on file  Stress: Not on file  Social Connections: Not on file  Intimate Partner Violence: Not on file    FAMILY HISTORY: Family History  Problem Relation Age of Onset  . Diabetes Mother   . Diabetes Other     ALLERGIES:  is allergic to penicillins, tetanus toxoids, lisinopril, losartan, tetanus toxoid, codeine, and doxycycline.  MEDICATIONS:  Current Outpatient Medications  Medication Sig Dispense Refill  . acyclovir (ZOVIRAX) 400 MG tablet Take 1 tablet (400 mg total) by mouth 2 (two) times daily. 60 tablet 2  . allopurinol (ZYLOPRIM) 300 MG tablet Take 1 tablet (300 mg total) by mouth daily. 30 tablet 3  . amLODipine (NORVASC) 5 MG tablet Take 1 tablet (5 mg total) by mouth daily. 90 tablet 0  . aspirin EC 81 MG tablet Take 81 mg by mouth daily. Swallow whole.    [name] atorvastatin (LIPITOR) 40 MG tablet Take 1 tablet (40 mg total) by mouth daily. 90 tablet 4  . benzonatate (TESSALON) 200 MG capsule Take 1 capsule (200 mg total) by mouth 3 (three) times daily as needed for cough. 30 capsule 0  . BREO ELLIPTA 100-25 MCG/INH AEPB Inhale 1 puff into the lungs daily.    . Budeson-Glycopyrrol-Formoterol (BREZTRI AEROSPHERE) 160-9-4.8 MCG/ACT AERO Inhale 2 puffs into the lungs 2 (two) times daily. 5.9 g 0  . buPROPion (WELLBUTRIN XL) 150 MG 24 hr tablet Take 1 tablet by mouth every morning.    . clonazePAM (KLONOPIN) 0.5 MG tablet Take 0.5 mg by mouth daily.     [name] dexamethasone (DECADRON) 4 MG tablet Take 2 tablets (8 mg total) by mouth daily. Start the day after bendamustine chemotherapy for 2 days. Take with food. 30 tablet 1  . DULoxetine (CYMBALTA) 60 MG capsule Take 1 capsule (60 mg total) by mouth daily. 90 capsule 0  . EUCRISA 2 % OINT Apply  a small amount to affected area   qd/bid to aa's chest, inframammary, eye    . finasteride (PROSCAR) 5 MG tablet TAKE 1 TABLET BY MOUTH ONCE DAILY 90 tablet 2  . HYDROcodone bit-homatropine (HYCODAN) 5-1.5 MG/5ML syrup Take 5 mLs by mouth every 4 (four) hours as needed for cough. 120 mL 0  . lansoprazole (PREVACID) 30 MG capsule TAKE 1 CAPSULE BY MOUTH ONCE DAILY AT NOON 30 capsule 2  . lidocaine-prilocaine (EMLA) cream Apply to affected area once 30 g 3  . loratadine (CLARITIN) 10 MG tablet Take 10 mg by mouth daily.     . metoprolol succinate (TOPROL-XL) 25 MG  24 hr tablet Take 1 tablet (25 mg total) by mouth daily. 90 tablet 1  . mirtazapine (REMERON) 15 MG tablet Take 15 mg by mouth at bedtime.    . montelukast (SINGULAIR) 10 MG tablet Take 1 tablet (10 mg total) by mouth at bedtime. 90 tablet 1  . mupirocin ointment (BACTROBAN) 2 % Apply 1 application topically 2 (two) times daily. 22 g 0  . OLANZapine (ZYPREXA) 7.5 MG tablet Take 7.5 mg by mouth at bedtime.    [name] PFIZER-BIONT COVID-19 VAC-TRIS SUSP injection     . prochlorperazine (COMPAZINE) 10 MG tablet TAKE 1 TABLET BY MOUTH EVERY 6 HOURS AS NEEDED NAUSEA AND VOMITING 30 tablet 1  . sulfamethoxazole-trimethoprim (BACTRIM DS) 800-160 MG tablet Take 1 tablet by mouth 2 (two) times daily for 7 days. 14 tablet 0  . tacrolimus (PROTOPIC) 0.1 % ointment Apply 1 application topically 2 (two) times daily.    . tadalafil (CIALIS) 20 MG tablet 1 tab 1 hour prior to intercourse 10 tablet 0  . testosterone cypionate (DEPOTESTOSTERONE CYPIONATE) 200 MG/ML injection Inject 1 mL (200 mg total) into the muscle every 14 (fourteen) days. 10 mL 0  . triamcinolone  (KENALOG) 0.1 % Apply to affected areas 1-2 times a day until rash improved. Avoid face, groin, underarms. 80 g 1  . Ferrous Sulfate (IRON) 142 (45 Fe) MG TBCR Take 1 tablet by mouth as needed. (Patient not taking: Reported on 05/06/2021)    . modafinil (PROVIGIL) 200 MG tablet Take 200 mg by mouth daily. (Patient not taking: No sig reported)    . naltrexone (DEPADE) 50 MG tablet  (Patient not taking: No sig reported)    . ondansetron (ZOFRAN) 8 MG tablet Take 1 tablet (8 mg total) by mouth 2 (two) times daily as needed for refractory nausea / vomiting. Start on day 2 after bendamustine chemo. (Patient not taking: No sig reported) 30 tablet 1  . predniSONE (DELTASONE) 10 MG tablet Take by mouth. (Patient not taking: Reported on 05/06/2021)    . tenofovir (VIREAD) 300 MG tablet Take 1 tablet (300 mg total) by mouth daily. (Patient not taking: No sig reported) 90 tablet 2   No current facility-administered medications for this visit.   Facility-Administered Medications Ordered in Other Visits  Medication Dose Route Frequency Provider Last Rate Last Admin  . 0.9 %  sodium chloride infusion   Intravenous Once PRN Sindy Guadeloupe, MD   Stopped at 05/06/21 1427  . sodium chloride flush (NS) 0.9 % injection 10 mL  10 mL Intracatheter PRN Sindy Guadeloupe, MD   10 mL at 05/06/21 1531      .  PHYSICAL EXAMINATION: ECOG PERFORMANCE STATUS: 1 - Symptomatic but completely ambulatory  Vitals:   05/06/21 0816  BP: 95/69  Pulse: (!) 107  Resp: 18  Temp: 99.3 F (37.4 C)  SpO2: 97%   Filed Weights   05/06/21 0816  Weight: 231 lb 9.6 oz (105.1 kg)    Physical Exam HENT:     Head: Normocephalic and atraumatic.     Mouth/Throat:     Pharynx: No oropharyngeal exudate.  Eyes:     Pupils: Pupils are equal, round, and reactive to light.  Neck:     Comments: Bilateral neck adenopathy/axillary/inguinal adenopathy. (Improved as per patient) Cardiovascular:     Rate and Rhythm: Normal rate and regular  rhythm.  Pulmonary:     Effort: Pulmonary effort is normal. No respiratory distress.     Breath sounds: Normal  breath sounds. No wheezing.  Abdominal:     General: Bowel sounds are normal. There is no distension.     Palpations: Abdomen is soft. There is no mass.     Tenderness: There is no abdominal tenderness. There is no guarding or rebound.  Musculoskeletal:        General: No tenderness. Normal range of motion.     Cervical back: Normal range of motion and neck supple.  Skin:    General: Skin is warm.  Neurological:     Mental Status: He is alert and oriented to person, place, and time.  Psychiatric:        Mood and Affect: Affect normal.      LABORATORY DATA:  I have reviewed the data as listed Lab Results  Component Value Date   WBC 4.2 05/06/2021   HGB 12.6 (L) 05/06/2021   HCT 35.6 (L) 05/06/2021   MCV 91.8 05/06/2021   PLT 82 (L) 05/06/2021   Recent Labs    07/25/20 0937 12/10/20 1403 02/27/21 1351 04/08/21 0804 05/06/21 0808  NA 135   < > 136 135 130*  K 4.2   < > 4.3 4.0 4.4  CL 97*   < > 98 98 96*  CO2 24   < > 24 25 24   GLUCOSE 103*   < > 112* 106* 162*  BUN 15   < > 12 12 12   CREATININE 1.12   < > 0.91 0.92 0.86  CALCIUM 9.3   < > 8.9 8.9 8.2*  GFRNONAA >60   < > >60 >60 >60  GFRAA >60  --   --   --   --   PROT 7.8   < > 6.9 6.7 5.7*  ALBUMIN 4.3   < > 4.0 3.9 3.3*  AST 41   < > 28 32 19  ALT 30   < > 22 19 22   ALKPHOS 100   < > 140* 146* 100  BILITOT 1.5*   < > 0.8 1.0 0.8   < > = values in this interval not displayed.    RADIOGRAPHIC STUDIES: I have personally reviewed the radiological images as listed and agreed with the findings in the report. No results found.  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 male with stage IV follicular lymphoma and progressive splenomegaly and multistation adenopathy s/p cycle 1 of Bendamustine Rituxan chemotherapy.  # proceed with cycle  #2- Benda-Rituxan. Labs today reviewed-platelets 82 [see below] otherwise acceptable for treatment today.   #Acute infusion reaction rituximab with cycle #1-recommend adding Pepcid; also Singulair; along with standard premeds.[name]   #Thrombocytopenia-moderate 82 [baseline 100]-combination of recent chemotherapy/splenomegaly.  I think is reasonable to continue chemotherapy today.  Discussed to hold aspirin.  Plan to repeat labs in 2 weeks.  #Erectile dysfunction-unlikely from [place].  On testosterone/defer to urology  #Hepatitis B core antibody positive/shingles prophylaxis-reminded the patient to restart back Tenofovir/acyclovir.  # DISPOSITION: # chemo today; d-2 # flabs in 2 weeks # follow up with Dr.Rao- Labs- cbc/cmp;LDH; Benda-Rituxa; D-2 benda-Dr.B  Addendum: Patient has moderate to severe reaction-second bump with rituximab infusion-at 37 mg; patient received steroids antihistamines.  Infusion held for about an hour; restarted back at 25-immediately had a moderate infusion reaction.  Hold rituximab today.  Recommend proceeding with Bendamustine.  Recommend follow-up with Dr. Janese Banks in 1 week to discuss longer premedication schedule to avoid infusion reaction.  All questions were  answered. The patient knows to call the clinic with any problems, questions or concerns.    Cammie Sickle, MD 05/06/2021 4:44 PM

## 2021-05-06 NOTE — Progress Notes (Signed)
Prior to Ruxience infusion patient had a rash present all over his body and mild itching on his chest- MD team was already aware of this and per patient report, patient has been taking medication at home for the rash. Patient also had an intermittent cough prior to starting Ruxience today.  1156- Patient reports increased itching on his neck, chest, back, and in his bilateral eyes. Ruxience stopped. 0.9 % Sodium Chloride infusion started per allergic reaction protocol. Vital signs obtained, see flow sheet.   [place]- NP, Beckey Rutter, notified via telephone and coming to chair side to evaluate patient.   1200- Staff observes patient forcefully coughing. Patient reports his cough feels worse than baseline and feels "very deep." Patient's face, neck, and chest are flushed red.  1203- NP, Beckey Rutter, at chair side to evaluate patient. Patient states, "My lips feel weird." Vital signs obtained periodically; see vital sign flow sheet.  1204- Benadryl 25 mg IV once and Solu-Medrol 125 mg IV once administered at this time per NP, Beckey Rutter, verbal order and allergic reaction protocol.  1207- Per NP, Beckey Rutter, verbal order: administer Pepcid 20 mg IVPB once.   1215- NP, Beckey Rutter, is leaving chair side to discuss patient with MD, Dr. Rogue Bussing. Per NP, Beckey Rutter, verbal order: stop 0.9% Sodium Chloride infusion after 500 ml have infused; continue to monitor patient. NP is coming back to chair side to evaluate patient after talking with MD, Dr. Rogue Bussing.   1246- Patient reports all of his symptoms have resolved and states, "I am feeling much better, just tired." Staff observes red flushing to patient's face, neck, and chest has resolved and is gone. Coughing has resolved. Vital signs stable, see flow sheet.  1300- Patient remains asymptomatic and resting. NP, Beckey Rutter, at chair side to re-evaluate patient. Per NP, Beckey Rutter, and MD, Dr. Rogue Bussing, order: restart Ruxience infusion at  25 ml/hr for 1 hour. If patient tolerates this rate without any issues, then titrate Ruxience per protocol after the first hour.  1306- Vital signs stable. Ruxience restarted.  1356- Patient diaphoretic and reports itching to his entire back. Staff observed a different new red rash present to patient's chest, neck, and entire back. Patient denies any other symptoms at this time. Ruxience stopped. 0.9% Sodium Chloride infusion restarted per allergic reaction protocol. NP, Beckey Rutter, notified via telephone.   1358- Vital signs stable, see flow sheet. NP, Beckey Rutter, at chair side to evaluate patient. NP states, "Rash looks similar to a Macular Confluent Rash."  9735- Per NP, Beckey Rutter, verbal order: Solu-Medrol 125 mg IV once administered.   1400- Patient does not appear diaphoretic at this time. Patient reports he is no longer sweating. Rash to patient's chest and neck area is starting to improve.  1403- Vital signs remain stable. NP, Beckey Rutter, is leaving chair side to discuss patient with MD, Dr. Rogue Bussing. Per NP order: continue to monitor patient and infuse remaining 500 ml of 0.9% Sodium Chloride infusion.  1429- Vital signs remain stable. Rash is getting better, but still remains present to patient's chest, neck, and entire back. Rash is also now present on patient's bilateral knees (front and back), bilateral upper legs (front and back), and buttocks. NP, Beckey Rutter, notified and aware.   1436- Per NP order: hold/do not give remainder of Ruxience today; place order for Benadryl 25 mg IV once and administer; proceed with Bendeka treatment after Benadryl has been administered. Patient is being scheduled for a follow-up appointment with  Dr. Janese Banks next week. Patient got up to the restroom.   1447- Patient returned to chair and Benadryl 25 mg IV once administered.  1459- Patient reports itching is going away. Rash on patient's neck, chest, and back has resolved and is gone. Rash  remains present on patient's bilateral knees (front and back), bilateral upper legs (front and back), and buttocks.  1537- Rash on patient's patient's bilateral knees (front and back), bilateral upper legs (front and back), and buttocks has now completely resolved and is gone. Patient reports all symptoms have resolved and denies any issues at this time. Patient and vital signs stable. NP, Beckey Rutter, notified and aware.  5945- Per NP, Beckey Rutter, order: Patient may be discharged to home at this time. Patient advised to call clinic with any further issues and go to the Emergency Department in the event of an emergency. Patient verbalized understanding.

## 2021-05-06 NOTE — Patient Instructions (Addendum)
#   STOP asprin because of slightly low platelets.   # MAKE SURE THAT YOU ARE TAKING:  tenofovir (VIREAD) 300 MG tablet.

## 2021-05-06 NOTE — Assessment & Plan Note (Addendum)
Patient is a 60 y.o. male with stage IV follicular lymphoma and progressive splenomegaly and multistation adenopathy s/p cycle 1 of Bendamustine Rituxan chemotherapy.  # proceed with cycle #2- Benda-Rituxan. Labs today reviewed-platelets 82 [see below] otherwise acceptable for treatment today.   #Acute infusion reaction rituximab with cycle #1-recommend adding Pepcid; also Singulair; along with standard premeds.[name]   #Thrombocytopenia-moderate 82 [baseline 100]-combination of recent chemotherapy/splenomegaly.  I think is reasonable to continue chemotherapy today.  Discussed to hold aspirin.  Plan to repeat labs in 2 weeks.  #Erectile dysfunction-unlikely from [place].  On testosterone/defer to urology  #Hepatitis B core antibody positive/shingles prophylaxis-reminded the patient to restart back Tenofovir/acyclovir.  # DISPOSITION: # chemo today; d-2 # flabs in 2 weeks # follow up with Dr.Rao- Labs- cbc/cmp;LDH; Benda-Rituxa; D-2 benda-Dr.B  Addendum: Patient has moderate to severe reaction-second bump with rituximab infusion-at 37 mg; patient received steroids antihistamines.  Infusion held for about an hour; restarted back at 25-immediately had a moderate infusion reaction.  Hold rituximab today.  Recommend proceeding with Bendamustine.  Recommend follow-up with Dr. Janese Banks in 1 week to discuss longer premedication schedule to avoid infusion reaction.

## 2021-05-07 ENCOUNTER — Other Ambulatory Visit: Payer: Self-pay

## 2021-05-07 ENCOUNTER — Inpatient Hospital Stay: Payer: Medicare HMO

## 2021-05-07 VITALS — BP 132/76 | HR 115 | Temp 96.9°F | Resp 16

## 2021-05-07 DIAGNOSIS — C8202 Follicular lymphoma grade I, intrathoracic lymph nodes: Secondary | ICD-10-CM

## 2021-05-07 DIAGNOSIS — I10 Essential (primary) hypertension: Secondary | ICD-10-CM | POA: Diagnosis not present

## 2021-05-07 DIAGNOSIS — F1010 Alcohol abuse, uncomplicated: Secondary | ICD-10-CM | POA: Diagnosis not present

## 2021-05-07 DIAGNOSIS — F17210 Nicotine dependence, cigarettes, uncomplicated: Secondary | ICD-10-CM | POA: Diagnosis not present

## 2021-05-07 DIAGNOSIS — Z7952 Long term (current) use of systemic steroids: Secondary | ICD-10-CM | POA: Diagnosis not present

## 2021-05-07 DIAGNOSIS — F3162 Bipolar disorder, current episode mixed, moderate: Secondary | ICD-10-CM | POA: Diagnosis not present

## 2021-05-07 DIAGNOSIS — Z5111 Encounter for antineoplastic chemotherapy: Secondary | ICD-10-CM | POA: Diagnosis not present

## 2021-05-07 DIAGNOSIS — F410 Panic disorder [episodic paroxysmal anxiety] without agoraphobia: Secondary | ICD-10-CM | POA: Diagnosis not present

## 2021-05-07 DIAGNOSIS — Z7982 Long term (current) use of aspirin: Secondary | ICD-10-CM | POA: Diagnosis not present

## 2021-05-07 DIAGNOSIS — Z7951 Long term (current) use of inhaled steroids: Secondary | ICD-10-CM | POA: Diagnosis not present

## 2021-05-07 DIAGNOSIS — Z5112 Encounter for antineoplastic immunotherapy: Secondary | ICD-10-CM | POA: Diagnosis not present

## 2021-05-07 DIAGNOSIS — C8200 Follicular lymphoma grade I, unspecified site: Secondary | ICD-10-CM | POA: Diagnosis not present

## 2021-05-07 DIAGNOSIS — D696 Thrombocytopenia, unspecified: Secondary | ICD-10-CM | POA: Diagnosis not present

## 2021-05-07 MED ORDER — SODIUM CHLORIDE 0.9 % IV SOLN
10.0000 mg | Freq: Once | INTRAVENOUS | Status: AC
  Administered 2021-05-07: 10 mg via INTRAVENOUS
  Filled 2021-05-07: qty 10

## 2021-05-07 MED ORDER — HEPARIN SOD (PORK) LOCK FLUSH 100 UNIT/ML IV SOLN
500.0000 [IU] | Freq: Once | INTRAVENOUS | Status: AC | PRN
Start: 2021-05-07 — End: 2021-05-07
  Administered 2021-05-07: 500 [IU]
  Filled 2021-05-07: qty 5

## 2021-05-07 MED ORDER — SODIUM CHLORIDE 0.9 % IV SOLN
90.0000 mg/m2 | Freq: Once | INTRAVENOUS | Status: AC
  Administered 2021-05-07: 225 mg via INTRAVENOUS
  Filled 2021-05-07: qty 9

## 2021-05-07 MED ORDER — SODIUM CHLORIDE 0.9 % IV SOLN
Freq: Once | INTRAVENOUS | Status: AC
  Filled 2021-05-07: qty 250

## 2021-05-07 MED ORDER — HEPARIN SOD (PORK) LOCK FLUSH 100 UNIT/ML IV SOLN
INTRAVENOUS | Status: AC
  Filled 2021-05-07: qty 5

## 2021-05-07 MED ORDER — SODIUM CHLORIDE 0.9% FLUSH
10.0000 mL | INTRAVENOUS | Status: DC | PRN
  Filled 2021-05-07: qty 10

## 2021-05-07 NOTE — Patient Instructions (Signed)
[facility]  Discharge Instructions: Thank you for choosing [facility] to provide your oncology and hematology care.  If you have a lab appointment with the [facility], please go directly to the [facility] and check in at the registration area.  Wear comfortable clothing and clothing appropriate for easy access to any Portacath or PICC line.   We strive to give you quality time with your provider. You may need to reschedule your appointment if you arrive late (15 or more minutes).  Arriving late affects you and other patients whose appointments are after yours.  Also, if you miss three or more appointments without notifying the office, you may be dismissed from the clinic at the provider's discretion.      For prescription refill requests, have your pharmacy contact our office and allow 72 hours for refills to be completed.    Today you received the following chemotherapy and/or immunotherapy agents - bendamustine      To help prevent nausea and vomiting after your treatment, we encourage you to take your nausea medication as directed.  BELOW ARE SYMPTOMS THAT SHOULD BE REPORTED IMMEDIATELY: . *FEVER GREATER THAN 100.4 F (38 C) OR HIGHER . *CHILLS OR SWEATING . *NAUSEA AND VOMITING THAT IS NOT CONTROLLED WITH YOUR NAUSEA MEDICATION . *UNUSUAL SHORTNESS OF BREATH . *UNUSUAL BRUISING OR BLEEDING . *URINARY PROBLEMS (pain or burning when urinating, or frequent urination) . *BOWEL PROBLEMS (unusual diarrhea, constipation, pain near the anus) . TENDERNESS IN MOUTH AND THROAT WITH OR WITHOUT PRESENCE OF ULCERS (sore throat, sores in mouth, or a toothache) . UNUSUAL RASH, SWELLING OR PAIN  . UNUSUAL VAGINAL DISCHARGE OR ITCHING   Items with * indicate a potential emergency and should be followed up as soon as possible or go to the Emergency Department if any problems should occur.  Please show the CHEMOTHERAPY ALERT CARD or IMMUNOTHERAPY  ALERT CARD at check-in to the Emergency Department and triage nurse.  Should you have questions after your visit or need to cancel or reschedule your appointment, please contact [facility]  [phone] and follow the prompts.  Office hours are 8:00 a.m. to 4:30 p.m. Monday - Friday. Please note that voicemails left after 4:00 p.m. may not be returned until the following business day.  We are closed weekends and major holidays. You have access to a nurse at all times for urgent questions. Please call the main number to the clinic [phone] and follow the prompts.  For any non-urgent questions, you may also contact your provider using MyChart. We now offer e-Visits for anyone 89 and older to request care online for non-urgent symptoms. For details visit [email or web address].   Also download the MyChart app! Go to the app store, search "MyChart", open the app, select [facility], and log in with your MyChart username and password.  Due to Covid, a mask is required upon entering the hospital/clinic. If you do not have a mask, one will be given to you upon arrival. For doctor visits, patients may have 1 support person aged 61 or older with them. For treatment visits, patients cannot have anyone with them due to current Covid guidelines and our immunocompromised population.   Bendamustine Injection What is this medicine? BENDAMUSTINE (BEN da MUS teen) is a chemotherapy drug. It is used to treat chronic lymphocytic leukemia and non-Hodgkin lymphoma. This medicine may be used for other purposes; ask your health care provider or pharmacist if you have  questions. COMMON BRAND NAME(S): Kristine Royal, Treanda What should I tell my health care provider before I take this medicine? They need to know if you have any of these conditions:  infection (especially a virus infection such as chickenpox, cold sores, or herpes)  kidney disease  liver disease  an  unusual or allergic reaction to bendamustine, mannitol, other medicines, foods, dyes, or preservatives  pregnant or trying to get pregnant  breast-feeding How should I use this medicine? This medicine is for infusion into a vein. It is given by a health care professional in a hospital or clinic setting. Talk to your pediatrician regarding the use of this medicine in children. Special care may be needed. Overdosage: If you think you have taken too much of this medicine contact a poison control center or emergency room at once. NOTE: This medicine is only for you. Do not share this medicine with others. What if I miss a dose? It is important not to miss your dose. Call your doctor or health care professional if you are unable to keep an appointment. What may interact with this medicine? Do not take this medicine with any of the following medications:  clozapine This medicine may also interact with the following medications:  atazanavir  cimetidine  ciprofloxacin  enoxacin  fluvoxamine  medicines for seizures like carbamazepine and phenobarbital  mexiletine  rifampin  tacrine  thiabendazole  zileuton This list may not describe all possible interactions. Give your health care provider a list of all the medicines, herbs, non-prescription drugs, or dietary supplements you use. Also tell them if you smoke, drink alcohol, or use illegal drugs. Some items may interact with your medicine. What should I watch for while using this medicine? This drug may make you feel generally unwell. This is not uncommon, as chemotherapy can affect healthy cells as well as cancer cells. Report any side effects. Continue your course of treatment even though you feel ill unless your doctor tells you to stop. You may need blood work done while you are taking this medicine. Call your doctor or healthcare provider for advice if you get a fever, chills or sore throat, or other symptoms of a cold or flu. Do  not treat yourself. This drug decreases your body's ability to fight infections. Try to avoid being around people who are sick. This medicine may cause serious skin reactions. They can happen weeks to months after starting the medicine. Contact your healthcare provider right away if you notice fevers or flu-like symptoms with a rash. The rash may be red or purple and then turn into blisters or peeling of the skin. Or, you might notice a red rash with swelling of the face, lips or lymph nodes in your neck or under your arms. In some patients, this medicine may cause a serious brain infection that may cause death. If you have any problems seeing, thinking, speaking, walking, or standing, tell your health care provider right away. If you cannot reach your health care provider, urgently seek other source of medical care. This medicine may increase your risk to bruise or bleed. Call your doctor or healthcare provider if you notice any unusual bleeding. Talk to your doctor about your risk of cancer. You may be more at risk for certain types of cancers if you take this medicine. This medicine may increase your risk of skin cancer. Check your skin for changes to moles or for new growths while taking this medicine. Call your health care provider if you notice  any of these skin changes. Do not become pregnant while taking this medicine or for at least 6 months after stopping it. Women should inform their doctor if they wish to become pregnant or think they might be pregnant. Men should not father a child while taking this medicine and for at least 3 months after stopping it. There is a potential for serious side effects to an unborn child. Talk to your healthcare provider or pharmacist for more information. Do not breast-feed an infant while taking this medicine or for at least 1 week after stopping it. This medicine may make it more difficult to father a child. You should talk with your doctor or healthcare provider  if you are concerned about your fertility. What side effects may I notice from receiving this medicine? Side effects that you should report to your doctor or health care professional as soon as possible:  allergic reactions like skin rash, itching or hives, swelling of the face, lips, or tongue  low blood counts - this medicine may decrease the number of white blood cells, red blood cells and platelets. You may be at increased risk for infections and bleeding.  rash, fever, and swollen lymph nodes  redness, blistering, peeling, or loosening of the skin, including inside the mouth  signs of infection like fever or chills, cough, sore throat, pain or difficulty passing urine  signs of decreased platelets or bleeding like bruising, pinpoint red spots on the skin, black, tarry stools, blood in the urine  signs of decreased red blood cells like being unusually weak or tired, fainting spells, lightheadedness  signs and symptoms of kidney injury like trouble passing urine or change in the amount of urine  signs and symptoms of liver injury like dark yellow or brown urine; general ill feeling or flu-like symptoms; light-colored stools; loss of appetite; nausea; right upper belly pain; unusually weak or tired; yellowing of the eyes or skin Side effects that usually do not require medical attention (report to your doctor or health care professional if they continue or are bothersome):  constipation  decreased appetite  diarrhea  headache  mouth sores  nausea, vomiting  tiredness This list may not describe all possible side effects. Call your doctor for medical advice about side effects. You may report side effects to FDA at 1-800-FDA-1088. Where should I keep my medicine? This drug is given in a hospital or clinic and will not be stored at home. NOTE: This sheet is a summary. It may not cover all possible information. If you have questions about this medicine, talk to your doctor,  pharmacist, or health care provider.  2021 Elsevier/Gold Standard (2020-05-13 12:11:43)

## 2021-05-08 ENCOUNTER — Ambulatory Visit: Payer: Medicare HMO | Admitting: Internal Medicine

## 2021-05-08 ENCOUNTER — Encounter: Payer: Self-pay | Admitting: Pharmacist

## 2021-05-08 ENCOUNTER — Ambulatory Visit: Payer: Medicare HMO

## 2021-05-08 ENCOUNTER — Other Ambulatory Visit: Payer: Self-pay

## 2021-05-08 ENCOUNTER — Ambulatory Visit (INDEPENDENT_AMBULATORY_CARE_PROVIDER_SITE_OTHER): Payer: Medicare HMO | Admitting: Dermatology

## 2021-05-08 ENCOUNTER — Other Ambulatory Visit: Payer: Medicare HMO

## 2021-05-08 DIAGNOSIS — L089 Local infection of the skin and subcutaneous tissue, unspecified: Secondary | ICD-10-CM

## 2021-05-08 DIAGNOSIS — D489 Neoplasm of uncertain behavior, unspecified: Secondary | ICD-10-CM

## 2021-05-08 DIAGNOSIS — N410 Acute prostatitis: Secondary | ICD-10-CM

## 2021-05-08 DIAGNOSIS — C4442 Squamous cell carcinoma of skin of scalp and neck: Secondary | ICD-10-CM

## 2021-05-08 MED ORDER — SULFAMETHOXAZOLE-TRIMETHOPRIM 800-160 MG PO TABS: 1.0000 | ORAL_TABLET | Freq: Two times a day (BID) | ORAL | 0 refills | Status: DC

## 2021-05-08 NOTE — Patient Instructions (Signed)

## 2021-05-08 NOTE — Progress Notes (Signed)
   Follow-Up Visit   Subjective  Russell Simpson is a 60 y.o. male who presents for the following: follow up on wound (Patient here today for follow up on wound on left side of neck. He states area is healing slowly but seems to be doing better than before. Patient just completed course of Bactrim Ds yesterday and states it really helped. He would like to get another prescription. ).  Patient reports its a still a little painful but not as painful as before.   The following portions of the chart were reviewed this encounter and updated as appropriate:  Tobacco  Allergies  Meds  Problems  Med Hx  Surg Hx  Fam Hx       Review of Systems: No other skin or systemic complaints.  Objective  Well appearing patient in no apparent distress; mood and affect are within normal limits.  A focused examination was performed including left neck. Relevant physical exam findings are noted in the Assessment and Plan.  left neck 2.5 cm pink plaque with central ulcer      Neck - Anterior Minimal drainage at ulcer at left neck  Assessment & Plan  Neoplasm of uncertain behavior left neck  Skin / nail biopsy Type of biopsy: tangential   Informed consent: discussed and consent obtained   Timeout: patient name, date of birth, surgical site, and procedure verified   Patient was prepped and draped in usual sterile fashion: Area prepped with isopropyl alcohol. Anesthesia: the lesion was anesthetized in a standard fashion   Anesthetic:  1% lidocaine w/ epinephrine 1-100,000 buffered w/ 8.4% NaHCO3 Instrument used: flexible razor blade   Hemostasis achieved with: aluminum chloride   Outcome: patient tolerated procedure well   Post-procedure details: wound care instructions given   Additional details:  Mupirocin and a bandage applied  Specimen 1 - Surgical pathology Differential Diagnosis: R/o recurrent scc vs healing ED&C site vs lymphoma (pt with follicular lymphoma)   Check Margins:  No  2.5 cm pink plaque with central ulcer   R/o recurrent SCC vs healing ED&C site vs lymphoma (pt with follicular lymphoma)  Related Medications sulfamethoxazole-trimethoprim (BACTRIM DS) 800-160 MG tablet Take 1 tablet by mouth 2 (two) times daily.  Local infection of skin and subcutaneous tissue Neck - Anterior  Significantly improved. Continue Bactrim DS up to 1 more week until not tender and not draining. Awaiting culture results.  Return for follow up in october and cancle appt next week. I, Ruthell Rummage, CMA, am acting as scribe for Forest Gleason, MD.  Documentation: I have reviewed the above documentation for accuracy and completeness, and I agree with the above.  Forest Gleason, MD

## 2021-05-09 ENCOUNTER — Other Ambulatory Visit: Payer: Self-pay | Admitting: Dermatology

## 2021-05-09 LAB — ANAEROBIC AND AEROBIC CULTURE

## 2021-05-09 MED ORDER — CIPROFLOXACIN HCL 250 MG PO TABS: 500.0000 mg | ORAL_TABLET | Freq: Two times a day (BID) | ORAL | 0 refills | Status: DC

## 2021-05-12 ENCOUNTER — Encounter: Payer: Self-pay | Admitting: Dermatology

## 2021-05-13 ENCOUNTER — Ambulatory Visit: Payer: Medicare HMO | Admitting: Dermatology

## 2021-05-13 ENCOUNTER — Inpatient Hospital Stay: Payer: Medicare HMO | Admitting: Oncology

## 2021-05-14 ENCOUNTER — Encounter: Payer: Self-pay | Admitting: Dermatology

## 2021-05-14 ENCOUNTER — Encounter: Payer: Self-pay | Admitting: Emergency Medicine

## 2021-05-14 ENCOUNTER — Encounter: Payer: Self-pay | Admitting: Internal Medicine

## 2021-05-14 ENCOUNTER — Other Ambulatory Visit: Payer: Self-pay

## 2021-05-14 ENCOUNTER — Ambulatory Visit: Payer: Self-pay

## 2021-05-14 ENCOUNTER — Ambulatory Visit: Payer: Medicare HMO | Admitting: Internal Medicine

## 2021-05-14 ENCOUNTER — Telehealth: Payer: Self-pay

## 2021-05-14 ENCOUNTER — Ambulatory Visit (INDEPENDENT_AMBULATORY_CARE_PROVIDER_SITE_OTHER): Payer: Medicare HMO | Admitting: Dermatology

## 2021-05-14 ENCOUNTER — Inpatient Hospital Stay
Admission: EM | Admit: 2021-05-14 | Discharge: 2021-05-20 | DRG: 871 | Disposition: A | Discharge status: Home / Self Care | Payer: Medicare HMO | Attending: Internal Medicine | Admitting: Internal Medicine

## 2021-05-14 ENCOUNTER — Emergency Department: Payer: Medicare HMO

## 2021-05-14 VITALS — BP 113/67 | HR 139 | Temp 98.9°F

## 2021-05-14 DIAGNOSIS — Z880 Allergy status to penicillin: Secondary | ICD-10-CM

## 2021-05-14 DIAGNOSIS — Z881 Allergy status to other antibiotic agents status: Secondary | ICD-10-CM

## 2021-05-14 DIAGNOSIS — G4733 Obstructive sleep apnea (adult) (pediatric): Secondary | ICD-10-CM | POA: Diagnosis present

## 2021-05-14 DIAGNOSIS — D701 Agranulocytosis secondary to cancer chemotherapy: Secondary | ICD-10-CM | POA: Diagnosis not present

## 2021-05-14 DIAGNOSIS — Z20822 Contact with and (suspected) exposure to covid-19: Secondary | ICD-10-CM | POA: Diagnosis present

## 2021-05-14 DIAGNOSIS — Z885 Allergy status to narcotic agent status: Secondary | ICD-10-CM

## 2021-05-14 DIAGNOSIS — L089 Local infection of the skin and subcutaneous tissue, unspecified: Secondary | ICD-10-CM | POA: Diagnosis not present

## 2021-05-14 DIAGNOSIS — Z981 Arthrodesis status: Secondary | ICD-10-CM

## 2021-05-14 DIAGNOSIS — T148XXA Other injury of unspecified body region, initial encounter: Secondary | ICD-10-CM | POA: Diagnosis not present

## 2021-05-14 DIAGNOSIS — E785 Hyperlipidemia, unspecified: Secondary | ICD-10-CM | POA: Diagnosis present

## 2021-05-14 DIAGNOSIS — E876 Hypokalemia: Secondary | ICD-10-CM | POA: Diagnosis not present

## 2021-05-14 DIAGNOSIS — I10 Essential (primary) hypertension: Secondary | ICD-10-CM | POA: Diagnosis present

## 2021-05-14 DIAGNOSIS — C8202 Follicular lymphoma grade I, intrathoracic lymph nodes: Secondary | ICD-10-CM | POA: Diagnosis present

## 2021-05-14 DIAGNOSIS — J302 Other seasonal allergic rhinitis: Secondary | ICD-10-CM | POA: Diagnosis present

## 2021-05-14 DIAGNOSIS — J449 Chronic obstructive pulmonary disease, unspecified: Secondary | ICD-10-CM

## 2021-05-14 DIAGNOSIS — F1020 Alcohol dependence, uncomplicated: Secondary | ICD-10-CM | POA: Diagnosis not present

## 2021-05-14 DIAGNOSIS — Z887 Allergy status to serum and vaccine status: Secondary | ICD-10-CM

## 2021-05-14 DIAGNOSIS — B965 Pseudomonas (aeruginosa) (mallei) (pseudomallei) as the cause of diseases classified elsewhere: Secondary | ICD-10-CM | POA: Diagnosis present

## 2021-05-14 DIAGNOSIS — F4310 Post-traumatic stress disorder, unspecified: Secondary | ICD-10-CM | POA: Diagnosis present

## 2021-05-14 DIAGNOSIS — Z7982 Long term (current) use of aspirin: Secondary | ICD-10-CM

## 2021-05-14 DIAGNOSIS — F17290 Nicotine dependence, other tobacco product, uncomplicated: Secondary | ICD-10-CM | POA: Diagnosis present

## 2021-05-14 DIAGNOSIS — R531 Weakness: Secondary | ICD-10-CM | POA: Diagnosis not present

## 2021-05-14 DIAGNOSIS — A4152 Sepsis due to Pseudomonas: Principal | ICD-10-CM | POA: Diagnosis present

## 2021-05-14 DIAGNOSIS — D709 Neutropenia, unspecified: Secondary | ICD-10-CM | POA: Diagnosis not present

## 2021-05-14 DIAGNOSIS — C4442 Squamous cell carcinoma of skin of scalp and neck: Secondary | ICD-10-CM

## 2021-05-14 DIAGNOSIS — C8590 Non-Hodgkin lymphoma, unspecified, unspecified site: Secondary | ICD-10-CM | POA: Diagnosis not present

## 2021-05-14 DIAGNOSIS — L03221 Cellulitis of neck: Secondary | ICD-10-CM | POA: Diagnosis present

## 2021-05-14 DIAGNOSIS — C8330 Diffuse large B-cell lymphoma, unspecified site: Secondary | ICD-10-CM | POA: Diagnosis not present

## 2021-05-14 DIAGNOSIS — Z789 Other specified health status: Secondary | ICD-10-CM

## 2021-05-14 DIAGNOSIS — A419 Sepsis, unspecified organism: Secondary | ICD-10-CM | POA: Diagnosis not present

## 2021-05-14 DIAGNOSIS — C760 Malignant neoplasm of head, face and neck: Secondary | ICD-10-CM | POA: Diagnosis not present

## 2021-05-14 DIAGNOSIS — R000 Tachycardia, unspecified: Secondary | ICD-10-CM | POA: Diagnosis not present

## 2021-05-14 DIAGNOSIS — G4730 Sleep apnea, unspecified: Secondary | ICD-10-CM | POA: Diagnosis present

## 2021-05-14 DIAGNOSIS — D492 Neoplasm of unspecified behavior of bone, soft tissue, and skin: Secondary | ICD-10-CM

## 2021-05-14 DIAGNOSIS — G8929 Other chronic pain: Secondary | ICD-10-CM | POA: Diagnosis present

## 2021-05-14 DIAGNOSIS — C4492 Squamous cell carcinoma of skin, unspecified: Secondary | ICD-10-CM | POA: Diagnosis not present

## 2021-05-14 DIAGNOSIS — N401 Enlarged prostate with lower urinary tract symptoms: Secondary | ICD-10-CM | POA: Diagnosis not present

## 2021-05-14 DIAGNOSIS — D485 Neoplasm of uncertain behavior of skin: Secondary | ICD-10-CM

## 2021-05-14 DIAGNOSIS — I700 Atherosclerosis of aorta: Secondary | ICD-10-CM | POA: Diagnosis present

## 2021-05-14 DIAGNOSIS — C8208 Follicular lymphoma grade I, lymph nodes of multiple sites: Secondary | ICD-10-CM | POA: Diagnosis not present

## 2021-05-14 DIAGNOSIS — Z79899 Other long term (current) drug therapy: Secondary | ICD-10-CM

## 2021-05-14 DIAGNOSIS — E871 Hypo-osmolality and hyponatremia: Secondary | ICD-10-CM | POA: Diagnosis present

## 2021-05-14 DIAGNOSIS — U071 COVID-19: Secondary | ICD-10-CM | POA: Diagnosis not present

## 2021-05-14 DIAGNOSIS — Z888 Allergy status to other drugs, medicaments and biological substances status: Secondary | ICD-10-CM

## 2021-05-14 DIAGNOSIS — R6520 Severe sepsis without septic shock: Secondary | ICD-10-CM | POA: Diagnosis not present

## 2021-05-14 DIAGNOSIS — T451X5A Adverse effect of antineoplastic and immunosuppressive drugs, initial encounter: Secondary | ICD-10-CM | POA: Diagnosis not present

## 2021-05-14 DIAGNOSIS — L0390 Cellulitis, unspecified: Secondary | ICD-10-CM | POA: Diagnosis present

## 2021-05-14 DIAGNOSIS — D84821 Immunodeficiency due to drugs: Secondary | ICD-10-CM | POA: Diagnosis not present

## 2021-05-14 DIAGNOSIS — Z7951 Long term (current) use of inhaled steroids: Secondary | ICD-10-CM

## 2021-05-14 DIAGNOSIS — R5081 Fever presenting with conditions classified elsewhere: Secondary | ICD-10-CM | POA: Diagnosis not present

## 2021-05-14 LAB — CBC WITH DIFFERENTIAL/PLATELET
Abs Immature Granulocytes: 0.01 10*3/uL (ref 0.00–0.07)
Basophils Absolute: 0 10*3/uL (ref 0.0–0.1)
Basophils Relative: 1 %
Eosinophils Absolute: 0 10*3/uL (ref 0.0–0.5)
Eosinophils Relative: 1 %
HCT: 31.1 % — ABNORMAL LOW (ref 39.0–52.0)
Hemoglobin: 11.3 g/dL — ABNORMAL LOW (ref 13.0–17.0)
Immature Granulocytes: 1 %
Lymphocytes Relative: 12 %
Lymphs Abs: 0.2 10*3/uL — ABNORMAL LOW (ref 0.7–4.0)
MCH: 32.1 pg (ref 26.0–34.0)
MCHC: 36.3 g/dL — ABNORMAL HIGH (ref 30.0–36.0)
MCV: 88.4 fL (ref 80.0–100.0)
Monocytes Absolute: 0.4 10*3/uL (ref 0.1–1.0)
Monocytes Relative: 31 %
Neutro Abs: 0.8 10*3/uL — ABNORMAL LOW (ref 1.7–7.7)
Neutrophils Relative %: 54 %
Platelets: 176 10*3/uL (ref 150–400)
RBC: 3.52 MIL/uL — ABNORMAL LOW (ref 4.22–5.81)
RDW: 14.3 % (ref 11.5–15.5)
Smear Review: NORMAL
WBC: 1.4 10*3/uL — CL (ref 4.0–10.5)
nRBC: 0 % (ref 0.0–0.2)

## 2021-05-14 LAB — URINALYSIS, COMPLETE (UACMP) WITH MICROSCOPIC
Bacteria, UA: NONE SEEN
Bilirubin Urine: NEGATIVE
Glucose, UA: NEGATIVE mg/dL
Hgb urine dipstick: NEGATIVE
Ketones, ur: 5 mg/dL — AB
Leukocytes,Ua: NEGATIVE
Nitrite: NEGATIVE
Protein, ur: NEGATIVE mg/dL
Specific Gravity, Urine: 1.003 — ABNORMAL LOW (ref 1.005–1.030)
Squamous Epithelial / HPF: NONE SEEN (ref 0–5)
WBC, UA: NONE SEEN WBC/hpf (ref 0–5)
pH: 6 (ref 5.0–8.0)

## 2021-05-14 LAB — COMPREHENSIVE METABOLIC PANEL
ALT: 24 U/L (ref 0–44)
AST: 18 U/L (ref 15–41)
Albumin: 3.4 g/dL — ABNORMAL LOW (ref 3.5–5.0)
Alkaline Phosphatase: 95 U/L (ref 38–126)
Anion gap: 12 (ref 5–15)
BUN: 9 mg/dL (ref 6–20)
CO2: 23 mmol/L (ref 22–32)
Calcium: 8.2 mg/dL — ABNORMAL LOW (ref 8.9–10.3)
Chloride: 91 mmol/L — ABNORMAL LOW (ref 98–111)
Creatinine, Ser: 0.86 mg/dL (ref 0.61–1.24)
GFR, Estimated: >60 mL/min (ref >60)
Glucose, Bld: 125 mg/dL — ABNORMAL HIGH (ref 70–99)
Potassium: 3.4 mmol/L — ABNORMAL LOW (ref 3.5–5.1)
Sodium: 126 mmol/L — ABNORMAL LOW (ref 135–145)
Total Bilirubin: 1.4 mg/dL — ABNORMAL HIGH (ref 0.3–1.2)
Total Protein: 5.5 g/dL — ABNORMAL LOW (ref 6.5–8.1)

## 2021-05-14 LAB — RESP PANEL BY RT-PCR (FLU A&B, COVID) ARPGX2
Influenza A by PCR: NEGATIVE
Influenza B by PCR: NEGATIVE
SARS Coronavirus 2 by RT PCR: POSITIVE — AB

## 2021-05-14 LAB — LACTIC ACID, PLASMA: Lactic Acid, Venous: 0.8 mmol/L (ref 0.5–1.9)

## 2021-05-14 LAB — MAGNESIUM: Magnesium: 1.4 mg/dL — ABNORMAL LOW (ref 1.7–2.4)

## 2021-05-14 LAB — PHOSPHORUS: Phosphorus: 3.4 mg/dL (ref 2.5–4.6)

## 2021-05-14 LAB — BRAIN NATRIURETIC PEPTIDE: B Natriuretic Peptide: 11.4 pg/mL (ref 0.0–100.0)

## 2021-05-14 MED ORDER — CLONAZEPAM 0.5 MG PO TABS
0.5000 mg | ORAL_TABLET | Freq: Every day | ORAL | Status: DC
  Administered 2021-05-15 – 2021-05-20 (×6): 0.5 mg via ORAL
  Filled 2021-05-14 (×6): qty 1

## 2021-05-14 MED ORDER — DULOXETINE HCL 60 MG PO CPEP
60.0000 mg | ORAL_CAPSULE | Freq: Every day | ORAL | Status: DC
  Administered 2021-05-15 – 2021-05-20 (×6): 60 mg via ORAL
  Filled 2021-05-14 (×2): qty 2
  Filled 2021-05-14: qty 1
  Filled 2021-05-14: qty 2
  Filled 2021-05-14 (×2): qty 1
  Filled 2021-05-14: qty 2
  Filled 2021-05-14: qty 1
  Filled 2021-05-14 (×2): qty 2
  Filled 2021-05-14 (×2): qty 1

## 2021-05-14 MED ORDER — MIRTAZAPINE 15 MG PO TABS
15.0000 mg | ORAL_TABLET | Freq: Every day | ORAL | Status: DC
  Administered 2021-05-15 – 2021-05-19 (×5): 15 mg via ORAL
  Filled 2021-05-14 (×5): qty 1

## 2021-05-14 MED ORDER — LEVOFLOXACIN IN D5W 750 MG/150ML IV SOLN
750.0000 mg | Freq: Once | INTRAVENOUS | Status: DC
  Filled 2021-05-14: qty 150

## 2021-05-14 MED ORDER — THIAMINE HCL 100 MG/ML IJ SOLN
100.0000 mg | Freq: Every day | INTRAMUSCULAR | Status: DC
  Administered 2021-05-20: 100 mg via INTRAVENOUS
  Filled 2021-05-14 (×2): qty 2

## 2021-05-14 MED ORDER — PNEUMOCOCCAL VAC POLYVALENT 25 MCG/0.5ML IJ INJ
0.5000 mL | INJECTION | INTRAMUSCULAR | Status: DC
  Filled 2021-05-14: qty 0.5

## 2021-05-14 MED ORDER — AMLODIPINE BESYLATE 5 MG PO TABS
5.0000 mg | ORAL_TABLET | Freq: Every day | ORAL | Status: DC
  Administered 2021-05-15 – 2021-05-16 (×2): 5 mg via ORAL
  Filled 2021-05-14 (×2): qty 1

## 2021-05-14 MED ORDER — BENZONATATE 100 MG PO CAPS: 200.0000 mg | ORAL_CAPSULE | Freq: Three times a day (TID) | ORAL | Status: DC | PRN

## 2021-05-14 MED ORDER — MUPIROCIN 2 % EX OINT
1.0000 "application " | TOPICAL_OINTMENT | Freq: Two times a day (BID) | CUTANEOUS | Status: DC
  Administered 2021-05-15 – 2021-05-20 (×10): 1 via TOPICAL
  Filled 2021-05-14 (×2): qty 22

## 2021-05-14 MED ORDER — ACETAMINOPHEN 325 MG PO TABS
650.0000 mg | ORAL_TABLET | Freq: Four times a day (QID) | ORAL | Status: DC | PRN
  Administered 2021-05-15 – 2021-05-18 (×4): 650 mg via ORAL
  Filled 2021-05-14 (×4): qty 2

## 2021-05-14 MED ORDER — SODIUM CHLORIDE 0.9 % IV SOLN: 250.0000 mL | INTRAVENOUS | Status: DC | PRN

## 2021-05-14 MED ORDER — SODIUM CHLORIDE 0.9% FLUSH: 3.0000 mL | INTRAVENOUS | Status: DC | PRN

## 2021-05-14 MED ORDER — KETOROLAC TROMETHAMINE 30 MG/ML IJ SOLN: 30.0000 mg | Freq: Four times a day (QID) | INTRAMUSCULAR | Status: AC | PRN

## 2021-05-14 MED ORDER — LEVOFLOXACIN IN D5W 750 MG/150ML IV SOLN
750.0000 mg | INTRAVENOUS | Status: DC
  Administered 2021-05-15: 750 mg via INTRAVENOUS
  Filled 2021-05-14 (×2): qty 150

## 2021-05-14 MED ORDER — OLANZAPINE 5 MG PO TABS
7.5000 mg | ORAL_TABLET | Freq: Every day | ORAL | Status: DC
  Administered 2021-05-15 – 2021-05-19 (×5): 7.5 mg via ORAL
  Filled 2021-05-14 (×7): qty 1.5

## 2021-05-14 MED ORDER — MODAFINIL 200 MG PO TABS: 200.0000 mg | ORAL_TABLET | Freq: Every day | ORAL | Status: DC

## 2021-05-14 MED ORDER — MONTELUKAST SODIUM 10 MG PO TABS
10.0000 mg | ORAL_TABLET | Freq: Every day | ORAL | Status: DC
  Administered 2021-05-15 – 2021-05-19 (×5): 10 mg via ORAL
  Filled 2021-05-14 (×5): qty 1

## 2021-05-14 MED ORDER — ADULT MULTIVITAMIN W/MINERALS CH
1.0000 | ORAL_TABLET | Freq: Every day | ORAL | Status: DC
  Administered 2021-05-15 – 2021-05-20 (×6): 1 via ORAL
  Filled 2021-05-14 (×6): qty 1

## 2021-05-14 MED ORDER — LEVOFLOXACIN IN D5W 750 MG/150ML IV SOLN
750.0000 mg | Freq: Once | INTRAVENOUS | Status: AC
  Administered 2021-05-14: 750 mg via INTRAVENOUS
  Filled 2021-05-14: qty 150

## 2021-05-14 MED ORDER — METOPROLOL SUCCINATE ER 50 MG PO TB24
25.0000 mg | ORAL_TABLET | Freq: Every day | ORAL | Status: DC
  Administered 2021-05-15 – 2021-05-20 (×6): 25 mg via ORAL
  Filled 2021-05-14 (×6): qty 1

## 2021-05-14 MED ORDER — SODIUM CHLORIDE 0.9 % IV SOLN
100.0000 mg | Freq: Every day | INTRAVENOUS | Status: AC
  Administered 2021-05-15 – 2021-05-18 (×4): 100 mg via INTRAVENOUS
  Filled 2021-05-14: qty 100
  Filled 2021-05-14: qty 20
  Filled 2021-05-14 (×2): qty 100

## 2021-05-14 MED ORDER — CRISABOROLE 2 % EX OINT: TOPICAL_OINTMENT | Freq: Two times a day (BID) | CUTANEOUS | Status: DC

## 2021-05-14 MED ORDER — THIAMINE HCL 100 MG PO TABS
100.0000 mg | ORAL_TABLET | Freq: Every day | ORAL | Status: DC
  Administered 2021-05-15 – 2021-05-19 (×5): 100 mg via ORAL
  Filled 2021-05-14 (×5): qty 1

## 2021-05-14 MED ORDER — LORAZEPAM 2 MG PO TABS
0.0000 mg | ORAL_TABLET | Freq: Four times a day (QID) | ORAL | Status: AC
  Filled 2021-05-14 (×2): qty 1

## 2021-05-14 MED ORDER — BUPROPION HCL ER (XL) 150 MG PO TB24
150.0000 mg | ORAL_TABLET | Freq: Every morning | ORAL | Status: DC
  Administered 2021-05-15 – 2021-05-20 (×6): 150 mg via ORAL
  Filled 2021-05-14 (×5): qty 1

## 2021-05-14 MED ORDER — SODIUM CHLORIDE 0.9% FLUSH
3.0000 mL | Freq: Two times a day (BID) | INTRAVENOUS | Status: DC
  Administered 2021-05-14 – 2021-05-20 (×13): 3 mL via INTRAVENOUS

## 2021-05-14 MED ORDER — LORATADINE 10 MG PO TABS
10.0000 mg | ORAL_TABLET | Freq: Every day | ORAL | Status: DC
  Administered 2021-05-15 – 2021-05-20 (×6): 10 mg via ORAL
  Filled 2021-05-14 (×6): qty 1

## 2021-05-14 MED ORDER — FINASTERIDE 5 MG PO TABS
5.0000 mg | ORAL_TABLET | Freq: Every day | ORAL | Status: DC
  Administered 2021-05-15 – 2021-05-20 (×6): 5 mg via ORAL
  Filled 2021-05-14 (×6): qty 1

## 2021-05-14 MED ORDER — LORAZEPAM 2 MG/ML IJ SOLN: 1.0000 mg | INTRAMUSCULAR | Status: AC | PRN

## 2021-05-14 MED ORDER — LORAZEPAM 2 MG PO TABS
0.0000 mg | ORAL_TABLET | Freq: Two times a day (BID) | ORAL | Status: AC
  Administered 2021-05-18: 1 mg via ORAL
  Filled 2021-05-14 (×2): qty 1

## 2021-05-14 MED ORDER — TACROLIMUS 0.1 % EX OINT: 1.0000 "application " | TOPICAL_OINTMENT | Freq: Two times a day (BID) | CUTANEOUS | Status: DC

## 2021-05-14 MED ORDER — ALLOPURINOL 300 MG PO TABS
300.0000 mg | ORAL_TABLET | Freq: Every day | ORAL | Status: DC
  Administered 2021-05-15 – 2021-05-20 (×6): 300 mg via ORAL
  Filled 2021-05-14 (×2): qty 3
  Filled 2021-05-14: qty 1
  Filled 2021-05-14: qty 3
  Filled 2021-05-14 (×2): qty 1
  Filled 2021-05-14: qty 3
  Filled 2021-05-14 (×2): qty 1
  Filled 2021-05-14 (×2): qty 3
  Filled 2021-05-14: qty 1

## 2021-05-14 MED ORDER — ONDANSETRON HCL 4 MG PO TABS: 8.0000 mg | ORAL_TABLET | Freq: Two times a day (BID) | ORAL | Status: DC | PRN

## 2021-05-14 MED ORDER — ASPIRIN EC 81 MG PO TBEC: 81.0000 mg | DELAYED_RELEASE_TABLET | Freq: Every day | ORAL | Status: DC

## 2021-05-14 MED ORDER — FLUTICASONE FUROATE-VILANTEROL 100-25 MCG/INH IN AEPB
1.0000 | INHALATION_SPRAY | Freq: Every day | RESPIRATORY_TRACT | Status: DC
  Administered 2021-05-15 – 2021-05-20 (×6): 1 via RESPIRATORY_TRACT
  Filled 2021-05-14 (×3): qty 28

## 2021-05-14 MED ORDER — VANCOMYCIN HCL IN DEXTROSE 1-5 GM/200ML-% IV SOLN
1000.0000 mg | Freq: Once | INTRAVENOUS | Status: AC
  Administered 2021-05-14: 1000 mg via INTRAVENOUS
  Filled 2021-05-14: qty 200

## 2021-05-14 MED ORDER — SODIUM CHLORIDE 0.9 % IV SOLN
200.0000 mg | Freq: Once | INTRAVENOUS | Status: AC
  Administered 2021-05-14: 200 mg via INTRAVENOUS
  Filled 2021-05-14: qty 200

## 2021-05-14 MED ORDER — SODIUM CHLORIDE 0.9 % IV BOLUS (SEPSIS)
1000.0000 mL | Freq: Once | INTRAVENOUS | Status: AC
  Administered 2021-05-14: 1000 mL via INTRAVENOUS

## 2021-05-14 MED ORDER — ATORVASTATIN CALCIUM 20 MG PO TABS
40.0000 mg | ORAL_TABLET | Freq: Every day | ORAL | Status: DC
  Administered 2021-05-15 – 2021-05-20 (×6): 40 mg via ORAL
  Filled 2021-05-14 (×6): qty 2

## 2021-05-14 MED ORDER — LORAZEPAM 1 MG PO TABS
1.0000 mg | ORAL_TABLET | ORAL | Status: AC | PRN
  Administered 2021-05-15 – 2021-05-16 (×3): 1 mg via ORAL

## 2021-05-14 MED ORDER — ENOXAPARIN SODIUM 60 MG/0.6ML IJ SOSY
50.0000 mg | PREFILLED_SYRINGE | INTRAMUSCULAR | Status: DC
  Administered 2021-05-14 – 2021-05-19 (×6): 50 mg via SUBCUTANEOUS
  Filled 2021-05-14 (×6): qty 0.6

## 2021-05-14 MED ORDER — HYDROCODONE BIT-HOMATROP MBR 5-1.5 MG/5ML PO SOLN: 5.0000 mL | ORAL | Status: DC | PRN

## 2021-05-14 MED ORDER — ACYCLOVIR 400 MG PO TABS
400.0000 mg | ORAL_TABLET | Freq: Two times a day (BID) | ORAL | Status: DC
  Filled 2021-05-14 (×4): qty 1

## 2021-05-14 MED ORDER — VANCOMYCIN HCL IN DEXTROSE 1-5 GM/200ML-% IV SOLN: 1000.0000 mg | Freq: Once | INTRAVENOUS | Status: DC

## 2021-05-14 MED ORDER — PANTOPRAZOLE SODIUM 40 MG PO TBEC
40.0000 mg | DELAYED_RELEASE_TABLET | Freq: Every day | ORAL | Status: DC
  Administered 2021-05-15 – 2021-05-20 (×6): 40 mg via ORAL
  Filled 2021-05-14 (×6): qty 1

## 2021-05-14 MED ORDER — BUDESON-GLYCOPYRROL-FORMOTEROL 160-9-4.8 MCG/ACT IN AERO: 2.0000 | INHALATION_SPRAY | Freq: Two times a day (BID) | RESPIRATORY_TRACT | Status: DC

## 2021-05-14 MED ORDER — TENOFOVIR DISOPROXIL FUMARATE 300 MG PO TABS: 300.0000 mg | ORAL_TABLET | Freq: Every day | ORAL | Status: DC

## 2021-05-14 MED ORDER — SODIUM CHLORIDE 0.9 % IV SOLN: 2.0000 g | INTRAVENOUS | Status: DC

## 2021-05-14 MED ORDER — ACETAMINOPHEN 650 MG RE SUPP: 650.0000 mg | Freq: Four times a day (QID) | RECTAL | Status: DC | PRN

## 2021-05-14 MED ORDER — FOLIC ACID 1 MG PO TABS
1.0000 mg | ORAL_TABLET | Freq: Every day | ORAL | Status: DC
  Administered 2021-05-15 – 2021-05-20 (×6): 1 mg via ORAL
  Filled 2021-05-14 (×6): qty 1

## 2021-05-14 NOTE — Telephone Encounter (Signed)
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 Laurence Ferrari reviewed with patient, he will also come for check tomorrow as site has odor and will repeat culture.   MAs please place Mohs referral after we confirm which Mohs surgeon tomorrow am.  Thank you!

## 2021-05-14 NOTE — Progress Notes (Signed)
   Follow-Up Visit   Subjective  Russell Simpson is a 60 y.o. 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   Pt initially presented 2 weeks ago with new painful, oozing lesion at left neck. Culture performed and he was started on Bactrim DS with plan for likely biopsy at follow-up. One week later he returned, with significant improvement but persistent lesion at neck, and biopsy was performed at that time.   Culture results came back and showed Pseudomonas putida and mixed anaerobes last Friday and we started Ciprofloxacin at that time. Biopsy results came back yesterday showing SCC with plan to refer for Mohs surgery. Pt reported wound doing better but some odor at site, so scheduled for recheck in clinic today.   Today he reports feeling tired with loss of appetite and some generalized weakness developing in the last 2 days.   The following portions of the chart were reviewed this encounter and updated as appropriate:  Tobacco  Allergies  Meds  Problems  Med Hx  Surg Hx  Fam Hx       Review of Systems: systemic symptoms as above  Objective  Tried appearing patient in no acute distress; mood and affect are within normal limits.  A focused examination was performed including right anterior neck and right chest. Relevant physical exam findings are noted in the Assessment and Plan.  Vital signs T 98.9 P 135 O2sat 90 BP 113/67  Left Anterior Neck Keratotic pink papule/nodule or plaque.   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  Checked Vitals T 98.9 P 135 O2sat 90 BP 113/67  Will send patient to ED due to tachycardia, fatigue, generalized weakness and anorexia in immunosuppressed patient with infection at left neck.  Patient advises he feels definitely well enough to drive himself to ED and declines ambulance.   Called ED ahead of patient's arrival to provide history  Neoplasm of skin Left Abdomen (side) - Upper  R.o sccis vs isk vs hypertrophic ak  Recommend repeat evaluation and possible biopsy at next follow up 1 - 6 weeks   Patient reports has been present for 1 year and not changing   35 minutes spent in care of this patient today  Return for 1 - 6 week follow up for treatment of spot at right chest . I, Ruthell Rummage, CMA, am acting as scribe for Forest Gleason, MD.  Documentation: I have reviewed the above documentation for accuracy and completeness, and I agree with the above.  Forest Gleason, MD

## 2021-05-14 NOTE — ED Notes (Signed)
New large bandaid applied to wound to left side of neck

## 2021-05-14 NOTE — Patient Instructions (Signed)

## 2021-05-14 NOTE — Progress Notes (Signed)
PHARMACIST - PHYSICIAN COMMUNICATION  CONCERNING:  Enoxaparin (Lovenox) for DVT Prophylaxis    RECOMMENDATION: Patient was prescribed enoxaprin 40mg  q24 hours for VTE prophylaxis.   Filed Weights   05/14/21 1019  Weight: 104.3 kg (230 lb)    Body mass index is 29.53 kg/m.  Estimated Creatinine Clearance: 120.5 mL/min (by C-G formula based on SCr of 0.86 mg/dL).   Based on [facility] patient is candidate for enoxaparin 0.5mg /kg TBW SQ every 24 hours based on BMI being >30.   DESCRIPTION: Pharmacy has adjusted enoxaparin dose per [facility] policy.  Patient is now receiving enoxaparin 50 mg every 24 hours    Dorothe Pea, PharmD, BCPS Clinical Pharmacist  05/14/2021 1:12 PM

## 2021-05-14 NOTE — ED Provider Notes (Signed)
[facility] Emergency Department Provider Note  ____________________________________________  Time seen: Approximately 11:46 AM  I have reviewed the triage vital signs and the nursing notes.   HISTORY  Chief Complaint Weakness and Tachycardia    HPI Russell Simpson is a 60 y.o. 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  I discussed the case and received additional history from his dermatologist, Dr. Laurence Ferrari.  She notes that the lesion on the left neck is biopsy-proven squamous cell carcinoma.  Additionally, he started having worsening erythema, swelling, pain and drainage from the area.  They cultured and found it to be infected with Pseudomonas species and mixed anaerobes.  He was treated with Bactrim and then changed to ciprofloxacin when culture data was available, but has continued to worsen.  Patient reports worsening fatigue, loss of appetite, chills over the past 2 days.  Symptoms are constant, waxing and waning without aggravating or alleviating factors.  No difficulty swallowing or breathing.  No cough, no headache or neck stiffness.  No limitation in neck range of motion.    Past Medical History:  Diagnosis Date  . 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 Arthritis   . Asthma   . Basal cell carcinoma 01/28/2021   R upper arm, EDC 03/04/2021  . Brain tumor (benign) ([place])    benign pituitary neoplasm  . Chronic pain    right arm  . Depression   . Environmental and seasonal allergies   . Follicular lymphoma ([place])   . Hypertension   . Pneumonia   . Sleep apnea    does not wear CPAP ; uses humidifier instead  . Squamous cell carcinoma of skin 02/19/2021   L inferior mandible, treated with [facility]      Patient Active Problem List   Diagnosis Date Noted  . Stage 2 moderate COPD by GOLD  classification ([place]) 05/14/2021  . 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 Aortic atherosclerosis ([place]) 02/27/2021  . High risk medication use 03/21/2020  . No-show for appointment 03/18/2020  . Noncompliance with treatment regimen 02/19/2020  . Erectile dysfunction due to arterial insufficiency 08/10/2019  . Mixed bipolar I disorder ([place]) 07/19/2019  . PTSD (post-traumatic stress disorder) 07/19/2019  . Panic disorder 07/19/2019  . Tobacco use disorder 07/19/2019  . Moderate episode of recurrent major depressive disorder ([place]) 05/25/2019  . S/P lumbar fusion 02/22/2019  . Alcohol use disorder, severe, in early remission ([place]) 01/01/2019  . Disorder of bursae of shoulder region 07/21/2017  . Full thickness rotator cuff tear 07/21/2017  . Localized, primary osteoarthritis 07/21/2017  . 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 Osteoarthritis of knee 07/21/2017  . Anxiety 01/10/2016  . BP (high blood pressure) 01/10/2016  . Apnea, sleep 01/10/2016  . Hyperlipidemia 10/15/2015  . Pituitary adenoma ([place]) 10/15/2015  . Hypogonadism in male 10/15/2015  . Depression 10/15/2015  . 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 Allergic rhinitis 10/15/2015  . Incomplete bladder emptying 05/30/2015  . Hernia, inguinal, left 09/21/2014  . Benign prostatic hyperplasia with lower urinary tract symptoms 10/24/2012  . 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 OSTEOMYELITIS, ACUTE, OTHER [facility] SITE 06/09/2006     Past Surgical History:  Procedure Laterality Date  . ANTERIOR CERVICAL DECOMP/DISCECTOMY FUSION N/A 06/17/2016   Procedure: ANTERIOR CERVICAL DECOMPRESSION FUSION, CERVICAL 3-4, CERVICAL 4-5  WITH INSTRUMENTATION AND ALLOGRAFT;  Surgeon: Phylliss Bob, MD;  Location: [place];  Service: Orthopedics;  Laterality: N/A;  ANTERIOR CERVICAL DECOMPRESSION FUSION, CERVICAL 3-4, CERVICAL 4-5 WITH INSTRUMENTATION AND ALLOGRAFT  . BACK SURGERY    . NECK SURGERY   2009  . PORTA CATH INSERTION N/A 04/03/2021   Procedure: PORTA CATH INSERTION;  Surgeon: Algernon Huxley, MD;  Location: [place] CV LAB;  Service: Cardiovascular;  Laterality: N/A;     Prior to Admission medications   Medication Sig Start Date End Date Taking? Authorizing Provider  acyclovir (ZOVIRAX) 400 MG tablet Take 1 tablet (400 mg total) by mouth 2 (two) times daily. 03/27/21   Sindy Guadeloupe, MD  allopurinol (ZYLOPRIM) 300 MG tablet Take 1 tablet (300 mg total) by mouth daily. 04/03/21   Sindy Guadeloupe, MD  amLODipine (NORVASC) 5 MG tablet Take 1 tablet (5 mg total) by mouth daily. 02/19/21   Birdie Sons, MD  aspirin EC 81 MG tablet Take 81 mg by mouth daily. Swallow whole.    [provider]  atorvastatin (LIPITOR) 40 MG tablet Take 1 tablet (40 mg total) by mouth daily. 02/19/21   Birdie Sons, MD  benzonatate (TESSALON) 200 MG capsule Take 1 capsule (200 mg total) by mouth 3 (three) times daily as needed for cough. 04/09/21   Sindy Guadeloupe, MD  BREO ELLIPTA 100-25 MCG/INH AEPB Inhale 1 puff into the lungs daily. 12/13/20   [provider]  Budeson-Glycopyrrol-Formoterol (BREZTRI AEROSPHERE) 160-9-4.8 MCG/ACT AERO Inhale 2 puffs into the lungs 2 (two) times daily. 06/25/20   Tyler Pita, MD  buPROPion (WELLBUTRIN XL) 150 MG 24 hr tablet Take 1 tablet by mouth every morning. 10/15/20   [provider]  ciprofloxacin (CIPRO) 250 MG tablet Take 2 tablets (500 mg total) by mouth 2 (two) times daily for 5 days. 05/09/21 05/14/21  Moye, [place], MD  clonazePAM (KLONOPIN) 0.5 MG tablet Take 0.5 mg by mouth daily.    Ander Slade, PA-C  dexamethasone (DECADRON) 4 MG tablet Take 2 tablets (8 mg total) by mouth daily. Start the day after bendamustine chemotherapy for 2 days. Take with food. 04/03/21   Sindy Guadeloupe, MD  DULoxetine (CYMBALTA) 60 MG capsule Take 1 capsule (60 mg total) by mouth daily. 02/19/21   Birdie Sons, MD  EUCRISA 2 % OINT Apply  a  small amount to affected area   qd/bid to aa's chest, inframammary, eye 08/30/19   [provider]  Ferrous Sulfate (IRON) 142 (45 Fe) MG TBCR Take 1 tablet by mouth as needed.    [provider]  finasteride (PROSCAR) 5 MG tablet TAKE 1 TABLET BY MOUTH ONCE DAILY 09/12/20   Stoioff, Ronda Fairly, MD  HYDROcodone bit-homatropine (HYCODAN) 5-1.5 MG/5ML syrup Take 5 mLs by mouth every 4 (four) hours as needed for cough. 04/17/21   Verlon Au, NP  lansoprazole (PREVACID) 30 MG capsule TAKE 1 CAPSULE BY MOUTH ONCE DAILY AT Gwendolyn Fill 04/10/21   Tyler Pita, MD  lidocaine-prilocaine (EMLA) cream Apply to affected area once 04/03/21   Sindy Guadeloupe, MD  loratadine (CLARITIN) 10 MG tablet Take 10 mg by mouth daily.  12/18/14   [provider]  metoprolol succinate (TOPROL-XL) 25 MG 24 hr tablet Take 1 tablet (25 mg total) by mouth daily. 02/19/21   Birdie Sons, MD  mirtazapine (REMERON) 15 MG tablet Take 15 mg by mouth at bedtime. 03/14/21   [provider]  modafinil (PROVIGIL) 200 MG tablet Take 200 mg by mouth daily.    [provider]  montelukast (SINGULAIR) 10 MG tablet Take 1 tablet (10 mg total) by mouth at bedtime. 02/19/21   Birdie Sons, MD  mupirocin ointment (BACTROBAN) 2 % Apply 1 application topically 2 (two) times daily. 03/28/21   Brendolyn Patty, MD  naltrexone (DEPADE) 50 MG tablet  10/28/20   [provider]  OLANZapine (ZYPREXA) 7.5 MG tablet Take 7.5 mg by mouth at bedtime.    [provider]  ondansetron (ZOFRAN) 8 MG tablet Take 1 tablet (8 mg total) by mouth 2 (two) times daily as needed for refractory nausea / vomiting. Start on day 2 after bendamustine chemo. 04/03/21   Sindy Guadeloupe, MD  PFIZER-BIONT COVID-19 VAC-TRIS SUSP injection  02/27/21   [provider]  predniSONE (DELTASONE) 10 MG tablet Take by mouth. 12/05/20   [provider]  prochlorperazine (COMPAZINE) 10 MG tablet TAKE 1 TABLET BY MOUTH  EVERY 6 HOURS AS NEEDED NAUSEA AND VOMITING 04/23/21   Sindy Guadeloupe, MD  sulfamethoxazole-trimethoprim (BACTRIM DS) 800-160 MG tablet Take 1 tablet by mouth 2 (two) times daily. 05/08/21   Moye, [place], MD  tacrolimus (PROTOPIC) 0.1 % ointment Apply 1 application topically 2 (two) times daily. 09/11/19   [provider]  tadalafil (CIALIS) 20 MG tablet 1 tab 1 hour prior to intercourse 08/23/20   Stoioff, Ronda Fairly, MD  tenofovir (VIREAD) 300 MG tablet Take 1 tablet (300 mg total) by mouth daily. 03/31/21 06/29/21  [name], MD  testosterone cypionate (DEPOTESTOSTERONE CYPIONATE) 200 MG/ML injection Inject 1 mL (200 mg total) into the muscle every 14 (fourteen) days. 02/22/21   Stoioff, Ronda Fairly, MD  triamcinolone (KENALOG) 0.1 % Apply to affected areas 1-2 times a day until rash improved. Avoid face, groin, underarms. 01/28/21   Brendolyn Patty, MD     Allergies Penicillins, Tetanus toxoids, Lisinopril, Losartan, Tetanus toxoid, Codeine, Doxycycline, and Ruxience [rituximab-pvvr]   Family History  Problem Relation Age of Onset  . Diabetes Mother   . Diabetes Other     Social History Social History   Tobacco Use  . Smoking status: Every Day    Pack years: 0.00    Types: Cigars  . Smokeless tobacco: Never  . Tobacco comments:    5-6 cigars a day  Vaping Use  . Vaping Use: Former  . Start date: 04/30/2017  . Quit date: 11/30/2017  Substance Use Topics  . Alcohol use: Not Currently    Alcohol/week: 0.0 standard drinks  . Drug use: No    Review of Systems  Constitutional:   No fever positive chills.  ENT:   No sore throat. No rhinorrhea.  Left side neck pain Cardiovascular:   No chest pain or syncope. Respiratory:   No dyspnea or cough. Gastrointestinal:   Negative for abdominal pain, vomiting and diarrhea.  Musculoskeletal:   Negative for focal pain or swelling All other systems reviewed and are negative except as documented above in ROS and  HPI.  ____________________________________________   PHYSICAL EXAM:  VITAL SIGNS: ED Triage Vitals  Enc Vitals Group     BP 05/14/21 1014 121/84     Pulse Rate 05/14/21 1014 (!) 140     Resp 05/14/21 1014 20     Temp 05/14/21 1014 98.6 F (37 C)     Temp Source 05/14/21 1014 Oral     SpO2 05/14/21 1014 98 %     Weight 05/14/21  1019 230 lb (104.3 kg)     Height 05/14/21 1019 6\' 2"  (1.88 m)     Head Circumference --      Peak Flow --      Pain Score 05/14/21 1018 0     Pain Loc --      Pain Edu? --      Excl. in [place]? --     Vital signs reviewed, nursing assessments reviewed.   Constitutional:   Alert and oriented. Non-toxic appearance. Eyes:   Conjunctivae are normal. EOMI. PERRL. ENT      Head:   Normocephalic and atraumatic.      Nose:   Normal.      Mouth/Throat:   Normal      Neck:   No meningismus. Full ROM.  1 cm skin ulceration on the left lateral neck inferior to the angle of the mandible with surrounding rolled edges, erythema induration and tenderness.  No frank purulent drainage currently.  No crepitus.  No fluctuant mass.  Hematological/Lymphatic/Immunilogical:   No cervical lymphadenopathy. Cardiovascular:   Tachycardia heart rate 130. Symmetric bilateral radial and DP pulses.  No murmurs. Cap refill less than 2 seconds. Respiratory:   Normal respiratory effort without tachypnea/retractions. Breath sounds are clear and equal bilaterally. No wheezes/rales/rhonchi. Gastrointestinal:   Soft and nontender. Non distended. There is no CVA tenderness.  No rebound, rigidity, or guarding. Genitourinary:   deferred Musculoskeletal:   Normal range of motion in all extremities. No joint effusions.  No lower extremity tenderness.  No edema. Neurologic:   Normal speech and language.  Motor grossly intact. No acute focal neurologic deficits are appreciated.  Skin:    Skin is warm, dry and intact. No rash noted.  No petechiae, purpura, or  bullae.  ____________________________________________    LABS (pertinent positives/negatives) (all labs ordered are listed, but only abnormal results are displayed) Labs Reviewed  COMPREHENSIVE METABOLIC PANEL - Abnormal; Notable for the following components:      Result Value   Sodium 126 (*)    Potassium 3.4 (*)    Chloride 91 (*)    Glucose, Bld 125 (*)    Calcium 8.2 (*)    Total Protein 5.5 (*)    Albumin 3.4 (*)    Total Bilirubin 1.4 (*)    All other components within normal limits  CBC WITH DIFFERENTIAL/PLATELET - Abnormal; Notable for the following components:   RBC 3.52 (*)    Hemoglobin 11.3 (*)    HCT 31.1 (*)    MCHC 36.3 (*)    All other components within normal limits  CULTURE, BLOOD (ROUTINE X 2)  CULTURE, BLOOD (ROUTINE X 2)  RESP PANEL BY RT-PCR (FLU A&B, COVID) ARPGX2  LACTIC ACID, PLASMA  URINALYSIS, COMPLETE (UACMP) WITH MICROSCOPIC   ____________________________________________   EKG  Interpreted by me Sinus tachycardia rate 129.  Normal axis and intervals.  Poor R wave progression.  Normal ST segments and T waves.  ____________________________________________    [id number]  DG Chest Portable 1 View  Result Date: 05/14/2021 CLINICAL DATA:  Weakness and tachycardia.  Lymphoma EXAM: PORTABLE CHEST 1 VIEW COMPARISON:  01/06/2014 FINDINGS: Cardiac and mediastinal contours normal. No adenopathy or mass. Lungs well aerated and clear Right jugular Port-A-Cath in place. No priors for comparison for catheter positioning. The catheter loops in the right internal jugular vein with the tip in the region of the right innominate vein. IMPRESSION: No acute abnormality. Right jugular Port-A-Cath tip in the region of the right innominate vein.  Electronically Signed   By: Franchot Gallo M.D.   On: 05/14/2021 11:33    ____________________________________________   PROCEDURES .Critical Care  Date/Time: 05/14/2021 11:47 AM Performed by: Carrie Mew,  MD Authorized by: Carrie Mew, MD   Critical care provider statement:    Critical care time (minutes):  35   Critical care time was exclusive of:  Separately billable procedures and treating other patients   Critical care was necessary to treat or prevent imminent or life-threatening deterioration of the following conditions:  Sepsis   Critical care was time spent personally by me on the following activities:  Development of treatment plan with patient or surrogate, discussions with consultants, evaluation of patient's response to treatment, examination of patient, obtaining history from patient or surrogate, ordering and performing treatments and interventions, ordering and review of laboratory studies, ordering and review of radiographic studies, pulse oximetry, re-evaluation of patient's condition and review of old charts  ____________________________________________  DIFFERENTIAL DIAGNOSIS   Cellulitis, sepsis, pneumonia, viral illness, electrolyte abnormality, dehydration  CLINICAL IMPRESSION / ASSESSMENT AND PLAN / ED COURSE  Medications ordered in the ED: Medications  sodium chloride 0.9 % bolus 1,000 mL (1,000 mLs Intravenous New Bag/Given 05/14/21 1114)  vancomycin (VANCOCIN) IVPB 1000 mg/200 mL premix (1,000 mg Intravenous New Bag/Given 05/14/21 1131)    Followed by  vancomycin (VANCOCIN) IVPB 1000 mg/200 mL premix (has no administration in time range)  levofloxacin (LEVAQUIN) IVPB 750 mg (has no administration in time range)    Pertinent labs & imaging results that were available during my care of the patient were reviewed by me and considered in my medical decision making (see chart for details).  Russell Simpson was evaluated in Emergency Department on 05/14/2021 for the symptoms described in the history of present illness. He was evaluated in the context of the global COVID-19 pandemic, which necessitated consideration that the patient might be at risk for infection with  the SARS-CoV-2 virus that causes COVID-19. Institutional protocols and algorithms that pertain to the evaluation of patients at risk for COVID-19 are in a state of rapid change based on information released by regulatory bodies including the CDC and federal and state organizations. These policies and algorithms were followed during the patient's care in the ED.     Clinical Course as of 05/14/21 1211  Wed May 14, 2021  1049 Patient presents with tachycardia, failure of outpatient treatment for Pseudomonas infection of left neck squamous cell carcinoma.  We will start IV antibiotic coverage with Levaquin and vancomycin.  Check labs, blood cultures and plan to admit due to sensitive location, failure of outpatient treatment, immunosuppression with recent chemotherapy.  Case discussed with his dermatologist Dr. Laurence Ferrari [PS]  1210 COVID-positive.  We will start remdesivir. [PS]    Clinical Course User Index [PS] Carrie Mew, MD     ____________________________________________   FINAL CLINICAL IMPRESSION(S) / ED DIAGNOSES    Final diagnoses:  Cellulitis of neck  Sepsis due to Pseudomonas species, unspecified whether acute organ dysfunction present ([place])  Failure of outpatient treatment  Stage 2 moderate COPD by GOLD classification ([place])  Squamous cell carcinoma of neck     ED Discharge Orders     None       Portions of this note were generated with dragon dictation software. Dictation errors may occur despite best attempts at proofreading.   Carrie Mew, MD 05/14/21 1151

## 2021-05-14 NOTE — Consult Note (Addendum)
PHARMACY -  BRIEF ANTIBIOTIC NOTE   Pharmacy has received consult(s) for Vancomycin & Levofloxacin from an ED provider.  The patient's profile has been reviewed for ht/wt/allergies/indication/available labs.    One time order(s) placed for: Vancomycin 2g loading dose (as two 1g infusions each over an hour) Levofloxacin IV 750mg  x1   **Has h/o taking omnicef (cefdinir) in March 2022 and April/May 2022.  If levaquin continued can further assess PCN allergy.  Pertinent allergies: Penicillins High Anaphylaxis   Doxycycline Low Rash    NOTE PTA: pt prescribed Ciprofloxacin 500mg  PO BID for 10d (6/10>>) & bactrim DS 1 tab PO BID for 7days (6/9>>) Home On acyclovir 400mg  BID and tenofovir 300mg  QD.  Further antibiotics/pharmacy consults should be ordered by admitting physician if indicated.                       Thank you, Lorna Dibble 05/14/2021  11:05 AM

## 2021-05-14 NOTE — Progress Notes (Signed)
Aspirin 81 mg ordered on admit. While reviewing orders with the patient he said that his oncologist said he was not to take aspirin. Order received from Dr Linda Hedges to discontinue aspirin

## 2021-05-14 NOTE — Consult Note (Signed)
Remdesivir - Pharmacy Brief Note   O:  ALT: 24 (BL 19-22) CXR: "Chest CT: No acute abnormality." SpO2: 98% on RA   A/P:  Remdesivir 200 mg IVPB once followed by 100 mg IVPB daily x 4 days.   Lorna Dibble, [facility] 05/14/2021 12:20 PM

## 2021-05-14 NOTE — H&P (Signed)
History and Physical    Russell Simpson PXT:[id number] DOB: [birth year] DOA: 05/14/2021  PCP: Charlynne Cousins, MD (Confirm with patient/family/NH records and if not entered, this has to be entered at [facility] point of entry) Patient coming from: home  I have personally briefly reviewed patient's old medical records in [place]  Chief Complaint: infected wound left neck  HPI: Russell Simpson is a 60 y.o. 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   I discussed the case and received additional history from his dermatologist, Dr. Laurence Ferrari.  She notes that the lesion on the left neck is biopsy-proven squamous cell carcinoma.  Additionally, he started having worsening erythema, swelling, pain and drainage from the area.  They cultured and found it to be infected with Pseudomonas Putida species ss to Cipro, Levqauin, cefipime and mixed anaerobes.  He was treated with Bactrim and then changed to ciprofloxacin when culture data was available, but has continued to worsen.   Patient reports worsening fatigue, loss of appetite, chills over the past 2 days.  Symptoms are constant, waxing and waning without aggravating or alleviating factors.  No difficulty swallowing or breathing.  No cough, no headache or neck stiffness.  No limitation in neck range of motion. Due to persistent symptoms he presents to [facility] ED for evaluation as directerd.   ED Course: T 98.6  130/92  HR 103  RR 16. Na 126, nl Cr, T Protein low at 5.5, T. Bili low at 1.4. WBC 1.4 with nl diff. Due to leukopenia, tachycardia and symptoms code sepsis initiated: patient bolused with 1 liter NS, Vanc and Levaquin given IV. Due to positive covid 19 test he was started by EDP on remdesivir but had no respiratory symptoms or hypoxemia.   TRH called to admit for continue IV abx and covid treatment.   Review of Systems: As per HPI otherwise 10 point review of systems negative.    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[place])    Arthritis    Asthma    Basal cell carcinoma 01/28/2021   R upper arm, [facility]C 03/04/2021   Brain 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   Chronic pain    right arm   Depression    Environmental and seasonal allergies    Follicular lymphoma ([place])    Hypertension    Pneumonia    Sleep apnea    does not wear CPAP ; uses humidifier instead   Squamous cell carcinoma of skin 02/19/2021   L inferior mandible, treated with [facility]     Past Surgical History:  Procedure Laterality Date   ANTERIOR CERVICAL DECOMP/DISCECTOMY FUSION N/A 06/17/2016   Procedure: ANTERIOR CERVICAL DECOMPRESSION FUSION, CERVICAL 3-4, CERVICAL 4-5 WITH INSTRUMENTATION AND ALLOGRAFT;  Surgeon: Phylliss Bob, MD;  Location: [place];  Service: Orthopedics;  Laterality: N/A;  ANTERIOR CERVICAL DECOMPRESSION FUSION, CERVICAL 3-4, CERVICAL 4-5 WITH INSTRUMENTATION AND ALLOGRAFT   BACK SURGERY     NECK SURGERY  2009   PORTA CATH INSERTION N/A 04/03/2021   Procedure: PORTA CATH INSERTION;  Surgeon: Algernon Huxley, MD;  Location: [place] [facility];  Service: Cardiovascular;  Laterality: N/A;    Soc Hx - Divorced. Has one daughter and a 61 y/o grandson. He is retired from [employer]  work. He lives alone.    reports that he has been smoking cigars. He has never used smokeless tobacco. He reports previous alcohol use. He reports that he does not use drugs. Patient active drinker. Uses THC  Allergies  Allergen Reactions   Penicillins Anaphylaxis   Tetanus Toxoids Swelling   Lisinopril Cough   Losartan      Other reaction(s): Muscle Pain     Tetanus Toxoid    Codeine Nausea Only and Nausea And Vomiting   Doxycycline Rash   Ruxience [Rituximab-Pvvr] Rash    05/06/21 pt w/ worsening rash during Ruxience  infusion.  Face, neck and chest flushing redness    Family History  Problem Relation Age of Onset   Diabetes Mother    Diabetes Other     Prior to Admission medications   Medication Sig Start Date End Date Taking? Authorizing Provider  acyclovir (ZOVIRAX) 400 MG tablet Take 1 tablet (400 mg total) by mouth 2 (two) times daily. 03/27/21   Sindy Guadeloupe, MD  allopurinol (ZYLOPRIM) 300 MG tablet Take 1 tablet (300 mg total) by mouth daily. 04/03/21   Sindy Guadeloupe, MD  amLODipine (NORVASC) 5 MG tablet Take 1 tablet (5 mg total) by mouth daily. 02/19/21   Birdie Sons, MD  aspirin EC 81 MG tablet Take 81 mg by mouth daily. Swallow whole.    [provider]  atorvastatin (LIPITOR) 40 MG tablet Take 1 tablet (40 mg total) by mouth daily. 02/19/21   Birdie Sons, MD  benzonatate (TESSALON) 200 MG capsule Take 1 capsule (200 mg total) by mouth 3 (three) times daily as needed for cough. 04/09/21   Sindy Guadeloupe, MD  BREO ELLIPTA 100-25 MCG/INH AEPB Inhale 1 puff into the lungs daily. 12/13/20   [provider]  Budeson-Glycopyrrol-Formoterol (BREZTRI AEROSPHERE) 160-9-4.8 MCG/ACT AERO Inhale 2 puffs into the lungs 2 (two) times daily. 06/25/20   Tyler Pita, MD  buPROPion (WELLBUTRIN XL) 150 MG 24 hr tablet Take 1 tablet by mouth every morning. 10/15/20   [provider]  ciprofloxacin (CIPRO) 250 MG tablet Take 2 tablets (500 mg total) by mouth 2 (two) times daily for 5 days. 05/09/21 05/14/21  Moye, [place], MD  clonazePAM (KLONOPIN) 0.5 MG tablet Take 0.5 mg by mouth daily.    Ander Slade, PA-C  dexamethasone (DECADRON) 4 MG tablet Take 2 tablets (8 mg total) by mouth daily. Start the day after bendamustine chemotherapy for 2 days. Take with food. 04/03/21   Sindy Guadeloupe, MD  DULoxetine (CYMBALTA) 60 MG capsule Take 1 capsule (60 mg total) by mouth daily. 02/19/21   Birdie Sons, MD  EUCRISA 2 % OINT Apply  a small amount to affected area   qd/bid to aa's  chest, inframammary, eye 08/30/19   [provider]  Ferrous Sulfate (IRON) 142 (45 Fe) MG TBCR Take 1 tablet by mouth as needed.    [provider]  finasteride (PROSCAR) 5 MG tablet TAKE 1 TABLET BY MOUTH ONCE DAILY 09/12/20   Stoioff, Ronda Fairly, MD  HYDROcodone bit-homatropine (HYCODAN) 5-1.5 MG/5ML syrup Take 5 mLs by mouth every 4 (four) hours as needed for cough. 04/17/21   Verlon Au, NP  lansoprazole (PREVACID) 30 MG capsule TAKE 1 CAPSULE BY MOUTH ONCE DAILY AT Gwendolyn Fill 04/10/21   Tyler Pita, MD  lidocaine-prilocaine (EMLA) cream Apply to affected area once 04/03/21   Sindy Guadeloupe, MD  loratadine (CLARITIN) 10 MG tablet Take 10  mg by mouth daily.  12/18/14   [provider]  metoprolol succinate (TOPROL-XL) 25 MG 24 hr tablet Take 1 tablet (25 mg total) by mouth daily. 02/19/21   Birdie Sons, MD  mirtazapine (REMERON) 15 MG tablet Take 15 mg by mouth at bedtime. 03/14/21   [provider]  modafinil (PROVIGIL) 200 MG tablet Take 200 mg by mouth daily.    [provider]  montelukast (SINGULAIR) 10 MG tablet Take 1 tablet (10 mg total) by mouth at bedtime. 02/19/21   Birdie Sons, MD  mupirocin ointment (BACTROBAN) 2 % Apply 1 application topically 2 (two) times daily. 03/28/21   Brendolyn Patty, MD  naltrexone (DEPADE) 50 MG tablet  10/28/20   [provider]  OLANZapine (ZYPREXA) 7.5 MG tablet Take 7.5 mg by mouth at bedtime.    [provider]  ondansetron (ZOFRAN) 8 MG tablet Take 1 tablet (8 mg total) by mouth 2 (two) times daily as needed for refractory nausea / vomiting. Start on day 2 after bendamustine chemo. 04/03/21   Sindy Guadeloupe, MD  PFIZER-BIONT COVID-19 VAC-TRIS SUSP injection  02/27/21   [provider]  predniSONE (DELTASONE) 10 MG tablet Take by mouth. 12/05/20   [provider]  prochlorperazine (COMPAZINE) 10 MG tablet TAKE 1 TABLET BY MOUTH EVERY 6 HOURS AS NEEDED NAUSEA AND VOMITING  04/23/21   Sindy Guadeloupe, MD  sulfamethoxazole-trimethoprim (BACTRIM DS) 800-160 MG tablet Take 1 tablet by mouth 2 (two) times daily. 05/08/21   Moye, [place], MD  tacrolimus (PROTOPIC) 0.1 % ointment Apply 1 application topically 2 (two) times daily. 09/11/19   [provider]  tadalafil (CIALIS) 20 MG tablet 1 tab 1 hour prior to intercourse 08/23/20   Stoioff, Ronda Fairly, MD  tenofovir (VIREAD) 300 MG tablet Take 1 tablet (300 mg total) by mouth daily. 03/31/21 06/29/21  [name], MD  testosterone cypionate (DEPOTESTOSTERONE CYPIONATE) 200 MG/ML injection Inject 1 mL (200 mg total) into the muscle every 14 (fourteen) days. 02/22/21   Stoioff, Ronda Fairly, MD  triamcinolone (KENALOG) 0.1 % Apply to affected areas 1-2 times a day until rash improved. Avoid face, groin, underarms. 01/28/21   Brendolyn Patty, MD    Physical Exam: Vitals:   05/14/21 1130 05/14/21 1200 05/14/21 1230 05/14/21 1300  BP: 122/85 (!) 136/92 130/81 131/79  Pulse: (!) 113 (!) 103 (!) 113 (!) 109  Resp: 14 16 18  (!) 22  Temp:      TempSrc:      SpO2: 93% 94% 95% 95%  Weight:      Height:         Vitals:   05/14/21 1130 05/14/21 1200 05/14/21 1230 05/14/21 1300  BP: 122/85 (!) 136/92 130/81 131/79  Pulse: (!) 113 (!) 103 (!) 113 (!) 109  Resp: 14 16 18  (!) 22  Temp:      TempSrc:      SpO2: 93% 94% 95% 95%  Weight:      Height:       General: heavyset man in no distress Eyes: PERRL, lids and conjunctivae normal ENMT: Mucous membranes are moist. Posterior pharynx clear of any exudate or lesions.Normal dentition.  Neck: normal, supple, no masses, no thyromegaly Respiratory: clear to auscultation bilaterally, no wheezing, no crackles. Normal respiratory effort. No accessory muscle use.  Cardiovascular: Regular rate and rhythm, no murmurs / rubs / gallops. No extremity edema. 2+ pedal pulses.  Abdomen: obese, no tenderness, no masses palpated. No hepatosplenomegaly. Bowel sounds  positive.  Musculoskeletal: no  clubbing / cyanosis. No joint deformity upper and lower extremities. Good ROM, no contractures. Normal muscle tone.  Skin: no rashes. Open biopsy wound left neck 2x2 cm approximately 1.5 cm deep with mild erythema and induration around the wound. Neurologic: CN 2-12 grossly intact. Sensation intact, DTR normal. Strength 5/5 in all 4.  Psychiatric: Normal judgment and insight. Alert and oriented x 3. Normal mood.     Labs on Admission: I have personally reviewed following labs and imaging studies  CBC: Recent Labs  Lab 05/14/21 1059  WBC 1.4*  NEUTROABS 0.8*  HGB 11.3*  HCT 31.1*  MCV 88.4  PLT 829   Basic Metabolic Panel: Recent Labs  Lab 05/14/21 1059  NA 126*  K 3.4*  CL 91*  CO2 23  GLUCOSE 125*  BUN 9  CREATININE 0.86  CALCIUM 8.2*   GFR: Estimated Creatinine Clearance: 120.5 mL/min (by C-G formula based on SCr of 0.86 mg/dL). Liver Function Tests: Recent Labs  Lab 05/14/21 1059  AST 18  ALT 24  ALKPHOS 95  BILITOT 1.4*  PROT 5.5*  ALBUMIN 3.4*   No results for input(s): LIPASE, AMYLASE in the last 168 hours. No results for input(s): AMMONIA in the last 168 hours. Coagulation Profile: No results for input(s): INR, PROTIME in the last 168 hours. Cardiac Enzymes: No results for input(s): CKTOTAL, CKMB, CKMBINDEX, TROPONINI in the last 168 hours. BNP (last 3 results) No results for input(s): PROBNP in the last 8760 hours. HbA1C: No results for input(s): HGBA1C in the last 72 hours. CBG: No results for input(s): GLUCAP in the last 168 hours. Lipid Profile: No results for input(s): CHOL, HDL, LDLCALC, TRIG, CHOLHDL, LDLDIRECT in the last 72 hours. Thyroid Function Tests: No results for input(s): TSH, T4TOTAL, FREET4, T3FREE, THYROIDAB in the last 72 hours. Anemia Panel: No results for input(s): VITAMINB12, FOLATE, FERRITIN, TIBC, IRON, RETICCTPCT in the last 72 hours. Urine analysis:    Component Value Date/Time   COLORURINE YELLOW (A) 05/14/2021 1059    APPEARANCEUR CLEAR (A) 05/14/2021 1059   APPEARANCEUR Clear 07/19/2013 0635   LABSPEC 1.003 (L) 05/14/2021 1059   LABSPEC 1.017 07/19/2013 0635   PHURINE 6.0 05/14/2021 1059   GLUCOSEU NEGATIVE 05/14/2021 1059   GLUCOSEU Negative 07/19/2013 0635   HGBUR NEGATIVE 05/14/2021 1059   BILIRUBINUR NEGATIVE 05/14/2021 1059   BILIRUBINUR Negative 10/02/2019 1818   BILIRUBINUR Negative 07/19/2013 0635   KETONESUR 5 (A) 05/14/2021 1059   PROTEINUR NEGATIVE 05/14/2021 1059   UROBILINOGEN 0.2 10/02/2019 1818   NITRITE NEGATIVE 05/14/2021 1059   LEUKOCYTESUR NEGATIVE 05/14/2021 1059   LEUKOCYTESUR Negative 07/19/2013 0635    Radiological Exams on Admission: DG Chest Portable 1 View  Result Date: 05/14/2021 CLINICAL DATA:  Weakness and tachycardia.  Lymphoma EXAM: PORTABLE CHEST 1 VIEW COMPARISON:  01/06/2014 FINDINGS: Cardiac and mediastinal contours normal. No adenopathy or mass. Lungs well aerated and clear Right jugular Port-A-Cath in place. No priors for comparison for catheter positioning. The catheter loops in the right internal jugular vein with the tip in the region of the right innominate vein. IMPRESSION: No acute abnormality. Right jugular Port-A-Cath tip in the region of the right innominate vein. Electronically Signed   By: Franchot Gallo M.D.   On: 05/14/2021 11:33    EKG: Independently reviewed. 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 Failed  outpatient treatment with Bactrim then Cipro. He is immunocompromised 2/2 chemotherapy. Outpatient culture grew pseudomonas [place] ss to cefipime, cipro, pipercillin.   Plan Med-surg admit  Continue IV Levaquin and vancomycin until symptomatically better - 24-48 hours  2. Sepsis - patient with leukopenia, tachycardia and known infection. He received IV fluid bolus and prompt initiation of abx.   3. Oncology - Followed by Dr. Rogue Bussing. He has had two cycles of bendamustine retuximab. He is taking allopurinol, acyclovir and tenofovir. 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 OSA - patient does not use CPAP  5. COPD GOLD II - stable. Does continue to smoke cigars daily - inhales. Continue home regimen.  6. BPH - no voiding problems. Continue home medications  7. HTN- continue home meds  8. Acohol use disorder severe- does continue to drink. Last drink 24 hours ago. Has had alcohol withdrawal symptoms in the past.  Plan CIWA protocol  DVT prophylaxis: lovenox  Code Status: full code  Family Communication: spoke with Odelia Gage, daughter and [place], [phone]. She gave additional information about alcohol use and h/o withdrawal, remote h/o injectable steroid use including sharing needles.   Disposition Plan: home when stable  Consults called: none  Admission status:  inpatient    Adella Hare MD [facility] Pager [phone]  If 7PM-7AM, please contact night-coverage [email or web address] Password [facility]  05/14/2021, 3:02 PM

## 2021-05-14 NOTE — ED Triage Notes (Signed)
Pt to ED via POV, pt sent [facility] for possible infection. Pt is a cancer patient. Pt is currently on Bactrim for possible infection on his neck. Pt states that he has had generalized weakness x 2 days, pt has not been able to eat, and had been tachycardic. Pt states that he is unsure if he has had fever at home. Pt denies chills. Pt reports that he does chemo infusion and chemo pills. Pt last had chemo infusion about 1 week ago. Pt takes chemo pill every day. Pt is in NAD.

## 2021-05-14 NOTE — Telephone Encounter (Signed)
Pt. Reports he had to reschedule his new pt. Appointment due to nausea and "not feeling well today. I'm at the [facility] having a procedure and then I'm going home."

## 2021-05-14 NOTE — Telephone Encounter (Signed)
Patient came into office today for follow up about wound at left anterior neck. He was informed of results. Given patient's fatigue, anorexia, generalized weakness and tachycardia in the setting of immunosuppression and recent infection at neck, sent patient to emergency department for evaluation and management including blood cultures and IV antibiotics.

## 2021-05-15 ENCOUNTER — Telehealth: Payer: Self-pay | Admitting: *Deleted

## 2021-05-15 ENCOUNTER — Other Ambulatory Visit: Payer: Self-pay

## 2021-05-15 ENCOUNTER — Encounter: Payer: Self-pay | Admitting: Oncology

## 2021-05-15 ENCOUNTER — Encounter: Payer: Self-pay | Admitting: Dermatology

## 2021-05-15 DIAGNOSIS — C8202 Follicular lymphoma grade I, intrathoracic lymph nodes: Secondary | ICD-10-CM

## 2021-05-15 DIAGNOSIS — T451X5A Adverse effect of antineoplastic and immunosuppressive drugs, initial encounter: Secondary | ICD-10-CM | POA: Diagnosis not present

## 2021-05-15 DIAGNOSIS — E876 Hypokalemia: Secondary | ICD-10-CM

## 2021-05-15 DIAGNOSIS — T148XXA Other injury of unspecified body region, initial encounter: Secondary | ICD-10-CM

## 2021-05-15 DIAGNOSIS — D701 Agranulocytosis secondary to cancer chemotherapy: Secondary | ICD-10-CM

## 2021-05-15 DIAGNOSIS — C4492 Squamous cell carcinoma of skin, unspecified: Secondary | ICD-10-CM

## 2021-05-15 DIAGNOSIS — L089 Local infection of the skin and subcutaneous tissue, unspecified: Secondary | ICD-10-CM

## 2021-05-15 DIAGNOSIS — L03221 Cellulitis of neck: Secondary | ICD-10-CM | POA: Diagnosis not present

## 2021-05-15 LAB — COMPREHENSIVE METABOLIC PANEL
ALT: 20 U/L (ref 0–44)
AST: 17 U/L (ref 15–41)
Albumin: 3 g/dL — ABNORMAL LOW (ref 3.5–5.0)
Alkaline Phosphatase: 79 U/L (ref 38–126)
Anion gap: 8 (ref 5–15)
BUN: 7 mg/dL (ref 6–20)
CO2: 25 mmol/L (ref 22–32)
Calcium: 7.9 mg/dL — ABNORMAL LOW (ref 8.9–10.3)
Chloride: 94 mmol/L — ABNORMAL LOW (ref 98–111)
Creatinine, Ser: 0.79 mg/dL (ref 0.61–1.24)
GFR, Estimated: >60 mL/min (ref >60)
Glucose, Bld: 125 mg/dL — ABNORMAL HIGH (ref 70–99)
Potassium: 3.1 mmol/L — ABNORMAL LOW (ref 3.5–5.1)
Sodium: 127 mmol/L — ABNORMAL LOW (ref 135–145)
Total Bilirubin: 1 mg/dL (ref 0.3–1.2)
Total Protein: 5.2 g/dL — ABNORMAL LOW (ref 6.5–8.1)

## 2021-05-15 LAB — CBC
HCT: 28.3 % — ABNORMAL LOW (ref 39.0–52.0)
Hemoglobin: 10.4 g/dL — ABNORMAL LOW (ref 13.0–17.0)
MCH: 32 pg (ref 26.0–34.0)
MCHC: 36.7 g/dL — ABNORMAL HIGH (ref 30.0–36.0)
MCV: 87.1 fL (ref 80.0–100.0)
Platelets: 170 10*3/uL (ref 150–400)
RBC: 3.25 MIL/uL — ABNORMAL LOW (ref 4.22–5.81)
RDW: 14.4 % (ref 11.5–15.5)
WBC: 1 10*3/uL — CL (ref 4.0–10.5)
nRBC: 0 % (ref 0.0–0.2)

## 2021-05-15 LAB — MAGNESIUM: Magnesium: 1.5 mg/dL — ABNORMAL LOW (ref 1.7–2.4)

## 2021-05-15 MED ORDER — TENOFOVIR ALAFENAMIDE FUMARATE 25 MG PO TABS
25.0000 mg | ORAL_TABLET | Freq: Every day | ORAL | Status: DC
  Administered 2021-05-15 – 2021-05-20 (×6): 25 mg via ORAL
  Filled 2021-05-15 (×6): qty 1

## 2021-05-15 MED ORDER — ONDANSETRON HCL 4 MG/2ML IJ SOLN
4.0000 mg | INTRAMUSCULAR | Status: DC | PRN
  Administered 2021-05-15: 4 mg via INTRAVENOUS
  Filled 2021-05-15: qty 2

## 2021-05-15 MED ORDER — ACYCLOVIR 200 MG PO CAPS
400.0000 mg | ORAL_CAPSULE | Freq: Two times a day (BID) | ORAL | Status: DC
  Administered 2021-05-15 – 2021-05-20 (×11): 400 mg via ORAL
  Filled 2021-05-15 (×12): qty 2

## 2021-05-15 MED ORDER — MAGNESIUM SULFATE 2 GM/50ML IV SOLN
2.0000 g | Freq: Once | INTRAVENOUS | Status: AC
  Administered 2021-05-15: 2 g via INTRAVENOUS
  Filled 2021-05-15: qty 50

## 2021-05-15 MED ORDER — POTASSIUM CHLORIDE CRYS ER 20 MEQ PO TBCR
40.0000 meq | EXTENDED_RELEASE_TABLET | Freq: Two times a day (BID) | ORAL | Status: AC
  Administered 2021-05-15 (×2): 40 meq via ORAL
  Filled 2021-05-15 (×2): qty 2

## 2021-05-15 MED ORDER — TBO-FILGRASTIM 480 MCG/0.8ML ~~LOC~~ SOSY
480.0000 ug | PREFILLED_SYRINGE | Freq: Every day | SUBCUTANEOUS | Status: DC
Start: 2021-05-15 — End: 2021-05-19
  Administered 2021-05-15 – 2021-05-18 (×4): 480 ug via SUBCUTANEOUS
  Filled 2021-05-15 (×6): qty 0.8

## 2021-05-15 NOTE — Progress Notes (Addendum)
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 Pt took bactrim DS initially and then was switched to po cipro after cx results came back outpatient. Dr. Laurence Ferrari (dermatology) was concerned about sepsis so the pt was sent to the ER. Recent wound cx grew pseudomonas putida. Continue on IV levaquin. ID consulted   Sepsis: met criteria w/ leukopenia, tachycardia, cellulitis & COVID19. Continue on IV abxs and IV remdesivir.   Leukopenia: likely secondary to recent chemo & follicular lymphoma.  Follicular lymphoma: recently has chemo (bendamustine, retuximab). Continue on home dose of allopurinol, acyclovir, tenofovir. 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 Management per dermatology   Hypokalemia: KCl repleated. Will continue to monitor  Hyponatremia: trending up. Will continue to monitor   Hypomagnesemia: mg sulfate ordered. Will continue to monitor   OSA: does not use CPAP  COPD: w/o exacerbation. Continues to smoke cigars daily. Continue on bronchodilators  BPH: continue on home dose of finasteride   HTN: continue on home dose of amlodipine, metoprolol   Alcohol abuse: alcohol cessation counseling. Continue on CIWA protocol.   COVID19 infection: not hypoxic and no pneumonia. Vaccinated & boostered as per pt. Continue on IV remdesivir, bronchodilators and encourage  incentive spirometry   DVT prophylaxis: lovenox  Code Status: full  Family Communication:  Disposition Plan: likely d/c back home   Level of care: Med-Surg   Status is: Inpatient  Remains inpatient appropriate because:IV treatments appropriate due to intensity of illness or inability to take PO and Inpatient level of care appropriate due to severity of illness  Dispo: The patient is from: Home              Anticipated d/c is to: Home              Patient currently is not medically stable to d/c.   Difficult to place patient : [place]     Consultants:  Onco ID  Procedures:   Antimicrobials: levaquin   Subjective: Pt c/o fatigue   Objective: Vitals:   05/14/21 1851 05/14/21 2029 05/15/21 0050 05/15/21 0445  BP: 131/81 127/75 132/81 130/80  Pulse: (!) 118 (!) 122 (!) 114 (!) 110  Resp: _0 Temp: 98.4 F (36.9 C) 98.7 F (37.1 C) 98.6 F (37 C) 98.2 F (36.8 C)  TempSrc: Oral Oral Oral   SpO2: 100% 97% 98% 99%  Weight:      Height:        Intake/Output Summary (Last 24 hours) at 05/15/2021 0748 Last data filed at 05/14/2021 2200 Gross per 24 hour  Intake 200 ml  Output 700 ml  Net -500 ml   Filed Weights   05/14/21 1019  Weight: 104.3 kg    Examination:  General exam: Appears calm and comfortable  Respiratory system: Clear to auscultation. Respiratory effort normal. Cardiovascular system: S1 & S2 +.  No rubs, gallops or clicks. Gastrointestinal system: Abdomen is nondistended, soft and nontender.Normal bowel sounds heard. Central nervous system: Alert and oriented. Moves all extremities  Psychiatry: Judgement and insight appear normal. Mood & affect appropriate.     Data Reviewed: I have personally reviewed following labs and imaging studies  CBC: Recent Labs  Lab 05/14/21 1059 05/15/21 0535  WBC 1.4* 1.0*  NEUTROABS 0.8*  --   HGB 11.3* 10.4*  HCT 31.1* 28.3*  MCV 88.4 87.1  PLT 176 449   Basic Metabolic Panel: Recent Labs   Lab 05/14/21 1059 05/14/21 1510 05/15/21 0535  NA 126*  --  127*  K 3.4*  --  3.1*  CL 91*  --  94*  CO2 23  --  25  GLUCOSE 125*  --  125*  BUN 9  --  7  CREATININE 0.86  --  0.79  CALCIUM 8.2*  --  7.9*  MG  --  1.4*  --   PHOS  --  3.4  --    GFR: Estimated Creatinine Clearance: 129.5 mL/min (by C-G formula based on SCr of 0.79 mg/dL). Liver Function Tests: Recent Labs  Lab 05/14/21 1059 05/15/21 0535  AST 18 17  ALT 24 20  ALKPHOS 95 79  BILITOT 1.4* 1.0  PROT 5.5* 5.2*  ALBUMIN 3.4* 3.0*   No results for input(s): LIPASE, AMYLASE in the last 168 hours. No results for input(s): AMMONIA in the last 168 hours. Coagulation Profile: No results for input(s): INR, PROTIME in the last 168 hours. Cardiac Enzymes: No results for input(s): CKTOTAL, CKMB, CKMBINDEX, TROPONINI in the last 168 hours. BNP (last 3 results) No results for input(s): PROBNP in the last 8760 hours. HbA1C: No results for input(s): HGBA1C in the last 72 hours. CBG: No results for input(s): GLUCAP in the last 168 hours. Lipid Profile: No results for input(s): CHOL, HDL, LDLCALC, TRIG, CHOLHDL, LDLDIRECT in the last 72 hours. Thyroid Function Tests: No results for input(s): TSH, T4TOTAL, FREET4, T3FREE, THYROIDAB in the last 72 hours. Anemia Panel: No results for input(s): VITAMINB12, FOLATE, FERRITIN, TIBC, IRON, RETICCTPCT in the last 72 hours. Sepsis Labs: Recent Labs  Lab 05/14/21 1059  LATICACIDVEN 0.8    Recent Results (from the past 240 hour(s))  Culture, blood (routine x 2)     Status: None (Preliminary result)   Collection Time: 05/14/21 10:59 AM   Specimen: BLOOD  Result Value Ref Range Status   Specimen Description BLOOD BLOOD RIGHT ARM  Final   Special Requests   Final    BOTTLES DRAWN AEROBIC AND ANAEROBIC Blood Culture results may not be optimal due to an excessive volume of blood received in culture bottles   Culture   Final    NO GROWTH < 24 HOURS Performed at [facility], [address] [place], [place] [address]    Report Status PENDING  Incomplete  Resp Panel by RT-PCR (Flu A&B, Covid) Nasopharyngeal Swab     Status: Abnormal   Collection Time: 05/14/21 10:59 AM   Specimen: Nasopharyngeal Swab; Nasopharyngeal(NP) swabs in vial transport medium  Result Value Ref Range Status   SARS Coronavirus 2 by RT PCR POSITIVE (A) NEGATIVE Final    Comment: RESULT CALLED TO, READ BACK BY AND VERIFIED WITH: ROBIN REGISTER [id number] 05/14/2021 J[facility] (NOTE) SARS-CoV-2 target nucleic acids are DETECTED.  The SARS-CoV-2 RNA is generally detectable in upper respiratory specimens during the acute phase of infection. Positive results are indicative of the presence of the  identified virus, but do not rule out bacterial infection or co-infection with other pathogens not detected by the test. Clinical correlation with patient history and other diagnostic information is necessary to determine patient infection status. The expected result is Negative.  Fact Sheet for Patients: EntrepreneurPulse.com.au  Fact Sheet for Healthcare Providers: IncredibleEmployment.be  This test is not yet approved or cleared by the [place] FDA and  has been authorized for detection and/or diagnosis of SARS-CoV-2 by FDA under an Emergency Use Authorization (EUA).  This EUA will remain in effect (meaning this test can b e used) for the duration of  the COVID-19 declaration under Section 564(b)(1) of the Act, 21 U.S.C. section 360bbb-3(b)(1), unless the authorization is terminated or revoked sooner.     Influenza A by PCR NEGATIVE NEGATIVE Final   Influenza B by PCR NEGATIVE NEGATIVE Final    Comment: (NOTE) The Xpert Xpress SARS-CoV-2/FLU/RSV plus assay is intended as an aid in the diagnosis of influenza from Nasopharyngeal swab specimens and should not be used as a sole basis for treatment. Nasal washings and aspirates are unacceptable for  Xpert Xpress SARS-CoV-2/FLU/RSV testing.  Fact Sheet for Patients: EntrepreneurPulse.com.au  Fact Sheet for Healthcare Providers: IncredibleEmployment.be  This test is not yet approved or cleared by the [place] FDA and has been authorized for detection and/or diagnosis of SARS-CoV-2 by FDA under an Emergency Use Authorization (EUA). This EUA will remain in effect (meaning this test can be used) for the duration of the COVID-19 declaration under Section 564(b)(1) of the Act, 21 U.S.C. section 360bbb-3(b)(1), unless the authorization is terminated or revoked.  Performed at [facility], [address] [place], [place] [address]   Culture, blood (routine x 2)     Status: None (Preliminary result)   Collection Time: 05/14/21 11:26 AM   Specimen: BLOOD  Result Value Ref Range Status   Specimen Description BLOOD RIGHT ANTECUBITAL  Final   Special Requests   Final    BOTTLES DRAWN AEROBIC AND ANAEROBIC Blood Culture adequate volume   Culture   Final    NO GROWTH < 24 HOURS Performed at [facility], [address], [place], [place] [address]    Report Status PENDING  Incomplete         Radiology Studies: DG Chest Portable 1 View  Result Date: 05/14/2021 CLINICAL DATA:  Weakness and tachycardia.  Lymphoma EXAM: PORTABLE CHEST 1 VIEW COMPARISON:  01/06/2014 FINDINGS: Cardiac and mediastinal contours normal. No adenopathy or mass. Lungs well aerated and clear Right jugular Port-A-Cath in place. No priors for comparison for catheter positioning. The catheter loops in the right internal jugular vein with the tip in the region of the right innominate vein. IMPRESSION: No acute abnormality. Right jugular Port-A-Cath tip in the region of the right innominate vein. Electronically Signed   By: Franchot Gallo M.D.   On: 05/14/2021 11:33        Scheduled Meds:  acyclovir  400 mg Oral BID   allopurinol  300 mg Oral Daily    amLODipine  5 mg Oral Daily   atorvastatin  40 mg Oral Daily   buPROPion  150 mg Oral q morning   clonazePAM  0.5 mg Oral Daily   Crisaborole   Topical BID   DULoxetine  60 mg Oral Daily   enoxaparin (LOVENOX) injection  50 mg Subcutaneous Q24H   finasteride  5 mg Oral Daily   fluticasone furoate-vilanterol  1 puff Inhalation Daily   folic acid  1  mg Oral Daily   loratadine  10 mg Oral Daily   LORazepam  0-4 mg Oral Q6H   Followed by   Derrill Memo ON 05/16/2021] LORazepam  0-4 mg Oral Q12H   metoprolol succinate  25 mg Oral Daily   mirtazapine  15 mg Oral QHS   montelukast  10 mg Oral QHS   multivitamin with minerals  1 tablet Oral Daily   mupirocin ointment  1 application Topical BID   OLANZapine  7.5 mg Oral QHS   pantoprazole  40 mg Oral Daily   pneumococcal 23 valent vaccine  0.5 mL Intramuscular Tomorrow-1000   potassium chloride  40 mEq Oral BID   sodium chloride flush  3 mL Intravenous Q12H   tacrolimus  1 application Topical BID   thiamine  100 mg Oral Daily   Or   thiamine  100 mg Intravenous Daily   Continuous Infusions:  sodium chloride     levofloxacin (LEVAQUIN) IV     remdesivir 100 mg in NS 100 mL       LOS: 1 day    Time spent:33 mins     Wyvonnia Dusky, MD [facility] Pager [phone]  If 7PM-7AM, please contact night-coverage  05/15/2021, 7:48 AM

## 2021-05-15 NOTE — Progress Notes (Signed)
Error

## 2021-05-15 NOTE — Consult Note (Signed)
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 Infusion reaction to rituximab at cycle 2. Discontinued. Plan to proceed with bendamustine. Last chemo on 05/06/21.  Continues acyclovir for shingles prophylaxis and tenofovir for hep b prophylaxis given his hep b core antibody prophylaxis. On allopurinol for TLS prophylaxis.   He presented to dermatology for left anterior neck wound on 05/01/21. He was started on bactrim. Biopsy was performed. Pathology consistent with squamous cell carcinoma. He presented to ER on 05/14/21 for wound infection. Culture was performed and was found to be positive for Pseudomonas species and mixed anaerobes. He was changed to ciprofloxacin based on culture data but symptoms worsened and he was referred to ER for sepsis. He was leukopenic, tachycardic, cellulitis, and covid 19 positive. He received IV levaquin. Reported penicillin allergy.   For admission he was tested for covid and was found to be positive. He was vaccinated against covid with booster. He also received evusheld. He tested positive for covid Apr 10, 2021. Did not receive antivirals d/t symptom onset > 7 days.   Review of Systems  Constitutional:  Negative for chills, fever, malaise/fatigue and weight loss.       Appetite & energy improved  HENT:  Negative for hearing loss, nosebleeds, sore throat and tinnitus.   Eyes:  Negative for blurred vision and double vision.  Respiratory:  Negative for cough, hemoptysis, shortness of  breath and wheezing.   Cardiovascular:  Negative for chest pain, palpitations and leg swelling.  Gastrointestinal:  Negative for abdominal pain, blood in stool, constipation, diarrhea, melena, nausea and vomiting.  Genitourinary:  Negative for dysuria and urgency.  Musculoskeletal:  Negative for back pain, falls, joint pain and myalgias.  Skin:  Negative for itching and rash.  Neurological:  Negative for dizziness, tingling, sensory change, loss of consciousness, weakness and headaches.  Endo/Heme/Allergies:  Negative for environmental allergies. Does not bruise/bleed easily.  Psychiatric/Behavioral:  Negative for depression. The patient is not nervous/anxious and does not have insomnia.    Allergies  Allergen Reactions   Penicillins Anaphylaxis    Tolerates cefdinir, cephalexin and amoxicillin/clavulonate so true PCN allergy unlikely   Tetanus Toxoids Swelling   Lisinopril Cough   Losartan      Other reaction(s): Muscle Pain     Tetanus Toxoid    Codeine Nausea Only and Nausea And Vomiting   Doxycycline Rash   Ruxience [Rituximab-Pvvr] Rash    05/06/21 pt w/ worsening rash during Ruxience infusion.  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 ([place]) 04/03/2021   Aortic atherosclerosis ([place]) 02/27/2021   High risk medication use 03/21/2020   No-show for appointment 03/18/2020   Noncompliance with treatment regimen 02/19/2020   Erectile dysfunction due to arterial insufficiency 08/10/2019  Mixed bipolar I disorder ([place]) 07/19/2019   PTSD (post-traumatic stress disorder) 07/19/2019   Panic disorder 07/19/2019   Tobacco use disorder 07/19/2019   Moderate  episode of recurrent major depressive disorder ([place]) 05/25/2019   S/P lumbar fusion 02/22/2019   Alcohol use disorder, severe, in early remission ([place]) 01/01/2019   Disorder of bursae of shoulder region 07/21/2017   Full thickness rotator cuff tear 07/21/2017   Localized, primary osteoarthritis 07/21/2017   Osteoarthritis of elbow 07/21/2017   Osteoarthritis of knee 07/21/2017   Anxiety 01/10/2016   BP (high blood pressure) 01/10/2016   Apnea, sleep 01/10/2016   Hyperlipidemia 10/15/2015   Pituitary adenoma ([place]) 10/15/2015   Hypogonadism in male 10/15/2015   Depression 10/15/2015   Impotence of organic origin 10/15/2015   Allergic rhinitis 10/15/2015   Incomplete bladder emptying 05/30/2015   Hernia, inguinal, left 09/21/2014   Benign prostatic hyperplasia with lower urinary tract symptoms 10/24/2012   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[place]) 09/05/2012   OSTEOMYELITIS, ACUTE, OTHER [facility] SITE 06/09/2006     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[place])    Arthritis    Asthma    Basal cell carcinoma 01/28/2021   R upper arm, EDC 03/04/2021   Brain 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   Chronic pain    right arm   Depression    Environmental and seasonal allergies    Follicular lymphoma ([place])    Hypertension    Pneumonia    Sleep apnea    does not wear CPAP ; uses humidifier instead   Squamous cell carcinoma of skin 02/19/2021   L inferior mandible, treated with [facility]      Past Surgical History:  Procedure Laterality Date   ANTERIOR CERVICAL DECOMP/DISCECTOMY FUSION N/A 06/17/2016   Procedure: ANTERIOR CERVICAL DECOMPRESSION FUSION, CERVICAL 3-4, CERVICAL 4-5 WITH INSTRUMENTATION AND ALLOGRAFT;  Surgeon: Phylliss Bob, MD;  Location: [place];  Service: Orthopedics;  Laterality: N/A;  ANTERIOR CERVICAL DECOMPRESSION FUSION, CERVICAL 3-4, CERVICAL 4-5 WITH INSTRUMENTATION AND ALLOGRAFT   BACK SURGERY     NECK SURGERY  2009   PORTA CATH  INSERTION N/A 04/03/2021   Procedure: PORTA CATH INSERTION;  Surgeon: Algernon Huxley, MD;  Location: [place] [facility];  Service: Cardiovascular;  Laterality: N/A;    Social History   Socioeconomic History   Marital status: Divorced    Spouse name: Not on file   Number of children: Not on file   Years of education: Not on file   Highest education level: Not on file  Occupational History   Not on file  Tobacco Use   Smoking status: Every Day    Pack years: 0.00    Types: Cigars   Smokeless tobacco: Never   Tobacco comments:    5-6 cigars a day  Vaping Use   Vaping Use: Former   Start date: 04/30/2017   Quit date: 11/30/2017  Substance and Sexual Activity   Alcohol use: Not Currently    Alcohol/week: 0.0 standard drinks   Drug use: No   Sexual activity: Not Currently  Other Topics Concern   Not on file  Social History Narrative   Not on file   Social Determinants of Health   Financial Resource Strain: Not on file  Food Insecurity: Not on file  Transportation Needs: Not on file  Physical Activity: Not on file  Stress: Not on file  Social Connections: Not on file     Family History  Problem Relation Age of Onset   Diabetes Mother    Diabetes Other       No current facility-administered medications on file prior to encounter.   Current Outpatient Medications on File Prior to Encounter  Medication Sig Dispense Refill   acyclovir (ZOVIRAX) 400 MG tablet Take 1 tablet (400 mg total) by mouth 2 (two) times daily. 60 tablet 2   allopurinol (ZYLOPRIM) 300 MG tablet Take 1 tablet (300 mg total) by mouth daily. 30 tablet 3   amLODipine (NORVASC) 5 MG tablet Take 1 tablet (5 mg total) by mouth daily. 90 tablet 0   aspirin EC 81 MG tablet Take 81 mg by mouth daily. Swallow whole.     atorvastatin (LIPITOR) 40 MG tablet Take 1 tablet (40 mg total) by mouth daily. 90 tablet 4   benzonatate (TESSALON) 200 MG capsule Take 1 capsule (200 mg total) by mouth 3 (three) times daily  as needed for cough. 30 capsule 0   BREO ELLIPTA 100-25 MCG/INH AEPB Inhale 1 puff into the lungs daily.     buPROPion (WELLBUTRIN XL) 150 MG 24 hr tablet Take 1 tablet by mouth every morning.     clonazePAM (KLONOPIN) 0.5 MG tablet Take 0.5 mg by mouth daily.     DULoxetine (CYMBALTA) 60 MG capsule Take 1 capsule (60 mg total) by mouth daily. 90 capsule 0   finasteride (PROSCAR) 5 MG tablet TAKE 1 TABLET BY MOUTH ONCE DAILY 90 tablet 2   lansoprazole (PREVACID) 30 MG capsule TAKE 1 CAPSULE BY MOUTH ONCE DAILY AT NOON 30 capsule 2   loratadine (CLARITIN) 10 MG tablet Take 10 mg by mouth daily.      metoprolol succinate (TOPROL-XL) 25 MG 24 hr tablet Take 1 tablet (25 mg total) by mouth daily. 90 tablet 1   mirtazapine (REMERON) 15 MG tablet Take 15 mg by mouth at bedtime.     modafinil (PROVIGIL) 200 MG tablet Take 200 mg by mouth daily.     montelukast (SINGULAIR) 10 MG tablet Take 1 tablet (10 mg total) by mouth at bedtime. 90 tablet 1   mupirocin ointment (BACTROBAN) 2 % Apply 1 application topically 2 (two) times daily. 22 g 0   naltrexone (DEPADE) 50 MG tablet      OLANZapine (ZYPREXA) 7.5 MG tablet Take 7.5 mg by mouth at bedtime.     ondansetron (ZOFRAN) 8 MG tablet Take 1 tablet (8 mg total) by mouth 2 (two) times daily as needed for refractory nausea / vomiting. Start on day 2 after bendamustine chemo. 30 tablet 1   prochlorperazine (COMPAZINE) 10 MG tablet TAKE 1 TABLET BY MOUTH EVERY 6 HOURS AS NEEDED NAUSEA AND VOMITING 30 tablet 1   sulfamethoxazole-trimethoprim (BACTRIM DS) 800-160 MG tablet Take 1 tablet by mouth 2 (two) times daily. 14 tablet 0   tacrolimus (PROTOPIC) 0.1 % ointment Apply 1 application topically 2 (two) times daily.     Budeson-Glycopyrrol-Formoterol (BREZTRI AEROSPHERE) 160-9-4.8 MCG/ACT AERO Inhale 2 puffs into the lungs 2 (two) times daily. (Patient not taking: Reported on 05/14/2021) 5.9 g 0   dexamethasone (DECADRON) 4 MG tablet Take 2 tablets (8 mg total) by  mouth daily. Start the day after bendamustine chemotherapy for 2 days. Take with food. 30 tablet 1   Ferrous Sulfate (IRON) 142 (45 Fe) MG TBCR Take 1 tablet by mouth as needed.     HYDROcodone bit-homatropine (HYCODAN) 5-1.5 MG/5ML syrup Take 5 mLs by mouth every 4 (four) hours as needed for cough.  120 mL 0   lidocaine-prilocaine (EMLA) cream Apply to affected area once 30 g 3   tadalafil (CIALIS) 20 MG tablet 1 tab 1 hour prior to intercourse 10 tablet 0   tenofovir (VIREAD) 300 MG tablet Take 1 tablet (300 mg total) by mouth daily. 90 tablet 2   testosterone cypionate (DEPOTESTOSTERONE CYPIONATE) 200 MG/ML injection Inject 1 mL (200 mg total) into the muscle every 14 (fourteen) days. 10 mL 0   triamcinolone (KENALOG) 0.1 % Apply to affected areas 1-2 times a day until rash improved. Avoid face, groin, underarms. 80 g 1     Today's Vitals   05/15/21 0050 05/15/21 0445 05/15/21 0911 05/15/21 1222  BP: 132/81 130/80 132/90 118/78  Pulse: (!) 114 (!) 110 (!) 116 (!) 111  Resp: 16 18 16 20   Temp: 98.6 F (37 C) 98.2 F (36.8 C) 98.8 F (37.1 C) 98.5 F (36.9 C)  TempSrc: Oral  Oral Oral  SpO2: 98% 99% 95% 93%  Weight:      Height:      PainSc:       Body mass index is 29.53 kg/m.  Physical Exam Vitals reviewed.  Constitutional:      Appearance: He is not ill-appearing or diaphoretic.  HENT:     Head: Normocephalic.     Mouth/Throat:     Mouth: Mucous membranes are moist.     Pharynx: Oropharynx is clear.  Eyes:     General: No scleral icterus.    Conjunctiva/sclera: Conjunctivae normal.  Cardiovascular:     Rate and Rhythm: Regular rhythm. Tachycardia present.     Pulses: Normal pulses.     Heart sounds: Normal heart sounds.  Pulmonary:     Effort: Pulmonary effort is normal. No respiratory distress.  Abdominal:     General: There is no distension.     Tenderness: There is no abdominal tenderness. There is no guarding.  Musculoskeletal:     Right lower leg: No edema.      Left lower leg: No edema.  Skin:    General: Skin is warm and dry.     Coloration: Skin is not pale.     Comments: Left neck wound open to air  Neurological:     General: No focal deficit present.     Mental Status: He is alert and oriented to person, place, and time.  Psychiatric:        Mood and Affect: Mood normal.        Behavior: Behavior normal.     CMP Latest Ref Rng & Units 05/15/2021 05/14/2021 05/06/2021  Glucose 70 - 99 mg/dL 125(H) 125(H) 162(H)  BUN 6 - 20 mg/dL 7 9 12   Creatinine 0.61 - 1.24 mg/dL 0.79 0.86 0.86  Sodium 135 - 145 mmol/L 127(L) 126(L) 130(L)  Potassium 3.5 - 5.1 mmol/L 3.1(L) 3.4(L) 4.4  Chloride 98 - 111 mmol/L 94(L) 91(L) 96(L)  CO2 22 - 32 mmol/L 25 23 24   Calcium 8.9 - 10.3 mg/dL 7.9(L) 8.2(L) 8.2(L)  Total Protein 6.5 - 8.1 g/dL 5.2(L) 5.5(L) 5.7(L)  Total Bilirubin 0.3 - 1.2 mg/dL 1.0 1.4(H) 0.8  Alkaline Phos 38 - 126 U/L 79 95 100  AST 15 - 41 U/L 17 18 19   ALT 0 - 44 U/L 20 24 22    CBC EXTENDED Latest Ref Rng & Units 05/15/2021 05/14/2021 05/06/2021  WBC 4.0 - 10.5 K/uL 1.0(LL) 1.4(LL) 4.2  RBC 4.22 - 5.81 MIL/uL 3.25(L) 3.52(L) 3.88(L)  HGB 13.0 - 17.0 g/dL  10.4(L) 11.3(L) 12.6(L)  HCT 39.0 - 52.0 % 28.3(L) 31.1(L) 35.6(L)  PLT 150 - 400 K/uL 170 176 82(L)  NEUTROABS 1.7 - 7.7 K/uL - 0.8(L) 2.0  LYMPHSABS 0.7 - 4.0 K/uL - 0.2(L) 1.4    DG Chest Portable 1 View  Result Date: 05/14/2021 CLINICAL DATA:  Weakness and tachycardia.  Lymphoma EXAM: PORTABLE CHEST 1 VIEW COMPARISON:  01/06/2014 FINDINGS: Cardiac and mediastinal contours normal. No adenopathy or mass. Lungs well aerated and clear Right jugular Port-A-Cath in place. No priors for comparison for catheter positioning. The catheter loops in the right internal jugular vein with the tip in the region of the right innominate vein. IMPRESSION: No acute abnormality. Right jugular Port-A-Cath tip in the region of the right innominate vein. Electronically Signed   By: Franchot Gallo M.D.    On: 05/14/2021 11:33     Assessment and plan- Patient is a 60 y.o. male with stage IV follicular lymphoma currently receiving chemotherapy who is currently admitted to hospital for sepsis secondary to infection of neck biopsy site, found to be neutropenic.   Chemotherapy induced neutropenia- afebrile. Continue antibiotics per ID. ANC 0.8, WBC 1.0. Received bendamustine on 05/06/21. Suspect counts are nadiring now. Will start him on neupogen 480 mcg daily until [place] reaches 1.0.  Chemotherapy induced anemia- hemoglobin 10.4. Normocytic. Monitor. Goal hemoglobin > 7.  Follicular lymphoma- stage IV. Currently s/p 2 cycles of rituximab-bendamustine chemotherapy. Ritux discontinued at cycle 2 d/t infusion reaction. Continue prophylaxis- allopurinol, tenofovir, acyclovir.   Will follow him while he is in hospital.   I discussed the assessment and treatment plan with the patient. The patient was provided an opportunity to ask questions and all were answered. The patient agreed with the plan and demonstrated an understanding of the instructions.   The patient was advised to call back or seek an in-person evaluation if the symptoms worsen or if the condition fails to improve as anticipated.  Thank you Dr. Jimmye Norman for allowing me to participate in the care of this very pleasant patient.    I spent 50 minutes face-to-face visit time dedicated to the care of this patient on the date of this encounter to including pre-visit review of progress and consult notes, labs, imaging,  face-to-face time with the patient, and post visit ordering of testing/documentation.   Delaney Meigs, DNP [place] [facility] 05/15/2021 6:28 PM

## 2021-05-15 NOTE — Consult Note (Signed)
NAME: Russell Simpson  DOB: 08/11/1961  MRN: [id number]  Date/Time: 05/15/2021 3:01 PM  REQUESTING PROVIDER: Dr. Jimmye Norman Subjective:  REASON FOR CONSULT: Neck wound, immunocompromised ? Russell Simpson is a 60 y.o. 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 In March 2022 he presented to the dermatologist with a swelling in the upper part of his neck close to the mandible area.  A biopsy was done by dermatologist and that turned out to be squamous cell carcinoma.  In April 2022 he underwent a lymph node biopsy and that showed B-cell lymphoma.  He received a windshield a long-acting monoclonal antibody for COVID on March 27, 2021.  The patient has been fully vaccinated.  He had a port placed on 04/03/2021   And received his first chemo which was Bendamustine along with CD24 inhibitor rituximab on 04/08/2021. On 04/10/2021 he was diagnosed with COVID. Symptoms were mild. Meanwhile he had noted that the wound on his neck was discharging foul-smelling fluid and also was hurting.  So he went to the dermatologist and was given Bactrim.  On 05/02/2019 culture was sent.  He then went for his second course of chemo on 05/06/2021 and 05/07/2021.  This time he did not receive rituximab.  On 05/08/2021 he returned To see the dermatologist and as he was improving he was asked to continue Bactrim.  Blood culture was not available then.  On 05/14/2021 he returned to the dermatologist with worsening symptoms and also having generalized weakness anorexia and not feeling well and was asked to go to the ED.  In the ED vitals 98.9, BP 113/67, heart rate 139 and sats 90%.  Labs revealed a WBC of 1.4, Hb 11.3, platelet 176.  Creatinine was 0.86.  Cultures were sent.     As the skin culture from 6-22 was Pseudomonas  he was started on Levaquin.  He also received a dose of vancomycin.. I am asked to see the patient for the same. She is feeling better today. 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[place])    Arthritis    Asthma    Basal cell carcinoma 01/28/2021   R upper arm, [facility] 03/04/2021   Brain 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   Chronic pain    right arm   Depression    Environmental and seasonal allergies    Follicular lymphoma ([place])    Hypertension    Pneumonia    Sleep apnea    does not wear CPAP ; uses humidifier instead   Squamous cell carcinoma of skin 02/19/2021   L inferior mandible, treated with [facility]     Past Surgical History:  Procedure Laterality Date   ANTERIOR CERVICAL DECOMP/DISCECTOMY FUSION N/A 06/17/2016   Procedure: ANTERIOR CERVICAL DECOMPRESSION FUSION, CERVICAL 3-4, CERVICAL 4-5 WITH INSTRUMENTATION AND ALLOGRAFT;  Surgeon: Phylliss Bob, MD;  Location: [facility];  Service: Orthopedics;  Laterality: N/A;  ANTERIOR CERVICAL DECOMPRESSION FUSION, CERVICAL 3-4, CERVICAL 4-5 WITH INSTRUMENTATION AND ALLOGRAFT   BACK SURGERY     NECK SURGERY  2009   PORTA CATH INSERTION N/A 04/03/2021   Procedure: PORTA CATH INSERTION;  Surgeon: Algernon Huxley, MD;  Location: [facility];  Service: Cardiovascular;  Laterality: N/A;  Social History   Socioeconomic History   Marital status: Divorced    Spouse name: Not on file   Number of children: Not on file   Years of education: Not on file   Highest education level: Not on file  Occupational History   Not on file  Tobacco Use   Smoking status: Every Day    Pack years: 0.00    Types: Cigars   Smokeless tobacco: Never   Tobacco comments:    5-6 cigars a day  Vaping Use   Vaping Use: Former   Start date: 04/30/2017   Quit date: 11/30/2017  Substance and Sexual Activity   Alcohol use: Not Currently    Alcohol/week: 0.0 standard drinks    Drug use: No   Sexual activity: Not Currently  Other Topics Concern   Not on file  Social History Narrative   Not on file   Social Determinants of Health   Financial Resource Strain: Not on file  Food Insecurity: Not on file  Transportation Needs: Not on file  Physical Activity: Not on file  Stress: Not on file  Social Connections: Not on file  Intimate Partner Violence: Not on file    Family History  Problem Relation Age of Onset   Diabetes Mother    Diabetes Other    Allergies  Allergen Reactions   Penicillins Anaphylaxis    Tolerates cefdinir, cephalexin and amoxicillin/clavulonate so true PCN allergy unlikely   Tetanus Toxoids Swelling   Lisinopril Cough   Losartan      Other reaction(s): Muscle Pain     Tetanus Toxoid    Codeine Nausea Only and Nausea And Vomiting   Doxycycline Rash   Ruxience [Rituximab-Pvvr] Rash    05/06/21 pt w/ worsening rash during Ruxience infusion.  Face, neck and chest flushing redness   I? Current Facility-Administered Medications  Medication Dose Route Frequency Provider Last Rate Last Admin   0.9 %  sodium chloride infusion  250 mL Intravenous PRN Norins, Heinz Knuckles, MD       acetaminophen (TYLENOL) tablet 650 mg  650 mg Oral Q6H PRN Norins, Heinz Knuckles, MD       Or   acetaminophen (TYLENOL) suppository 650 mg  650 mg Rectal Q6H PRN Norins, Heinz Knuckles, MD       acyclovir (ZOVIRAX) 200 MG capsule 400 mg  400 mg Oral BID Pernell Dupre, RPH   400 mg at 05/15/21 1214   allopurinol (ZYLOPRIM) tablet 300 mg  300 mg Oral Daily Norins, Heinz Knuckles, MD   300 mg at 05/15/21 0915   amLODipine (NORVASC) tablet 5 mg  5 mg Oral Daily Norins, Heinz Knuckles, MD   5 mg at 05/15/21 0912   atorvastatin (LIPITOR) tablet 40 mg  40 mg Oral Daily Norins, Heinz Knuckles, MD   40 mg at 05/15/21 0915   buPROPion (WELLBUTRIN XL) 24 hr tablet 150 mg  150 mg Oral q morning Norins, Heinz Knuckles, MD   150 mg at 05/15/21 7371   clonazePAM (KLONOPIN) tablet 0.5 mg  0.5 mg Oral  Daily Norins, Heinz Knuckles, MD   0.5 mg at 05/15/21 0626   DULoxetine (CYMBALTA) DR capsule 60 mg  60 mg Oral Daily Neena Rhymes, MD   60 mg at 05/15/21 0912   enoxaparin (LOVENOX) injection 50 mg  50 mg Subcutaneous Q24H Norins, Heinz Knuckles, MD   50 mg at 05/14/21 2240   finasteride (PROSCAR) tablet 5 mg  5 mg Oral Daily Norins, Heinz Knuckles,  MD   5 mg at 05/15/21 0918   fluticasone furoate-vilanterol (BREO ELLIPTA) 100-25 MCG/INH 1 puff  1 puff Inhalation Daily Norins, Heinz Knuckles, MD   1 puff at 00/93/81 8299   folic acid (FOLVITE) tablet 1 mg  1 mg Oral Daily Norins, Heinz Knuckles, MD   1 mg at 05/15/21 0912   ketorolac (TORADOL) 30 MG/ML injection 30 mg  30 mg Intravenous Q6H PRN Norins, Heinz Knuckles, MD       levofloxacin (LEVAQUIN) IVPB 750 mg  750 mg Intravenous Q24H Neena Rhymes, MD 100 mL/hr at 05/15/21 1219 Infusion Verify at 05/15/21 1219   loratadine (CLARITIN) tablet 10 mg  10 mg Oral Daily Norins, Heinz Knuckles, MD   10 mg at 05/15/21 0914   LORazepam (ATIVAN) tablet 1-4 mg  1-4 mg Oral Q1H PRN Neena Rhymes, MD   1 mg at 05/15/21 0150   Or   LORazepam (ATIVAN) injection 1-4 mg  1-4 mg Intravenous Q1H PRN Norins, Heinz Knuckles, MD       LORazepam (ATIVAN) tablet 0-4 mg  0-4 mg Oral Q6H Norins, Heinz Knuckles, MD       Followed by   Derrill Memo ON 05/16/2021] LORazepam (ATIVAN) tablet 0-4 mg  0-4 mg Oral Q12H Norins, Heinz Knuckles, MD       magnesium sulfate IVPB 2 g 50 mL  2 g Intravenous Once Wyvonnia Dusky, MD 50 mL/hr at 05/15/21 1457 2 g at 05/15/21 1457   metoprolol succinate (TOPROL-XL) 24 hr tablet 25 mg  25 mg Oral Daily Norins, Heinz Knuckles, MD   25 mg at 05/15/21 0916   mirtazapine (REMERON) tablet 15 mg  15 mg Oral QHS Norins, Heinz Knuckles, MD       montelukast (SINGULAIR) tablet 10 mg  10 mg Oral QHS Norins, Heinz Knuckles, MD       multivitamin with minerals tablet 1 tablet  1 tablet Oral Daily Norins, Heinz Knuckles, MD   1 tablet at 05/15/21 0913   mupirocin ointment (BACTROBAN) 2 % 1 application  1  application Topical BID Norins, Heinz Knuckles, MD   1 application at 37/16/96 0926   OLANZapine (ZYPREXA) tablet 7.5 mg  7.5 mg Oral QHS Norins, Heinz Knuckles, MD       pantoprazole (PROTONIX) EC tablet 40 mg  40 mg Oral Daily Norins, Heinz Knuckles, MD   40 mg at 05/15/21 0914   pneumococcal 23 valent vaccine (PNEUMOVAX-23) injection 0.5 mL  0.5 mL Intramuscular Tomorrow-1000 Norins, Heinz Knuckles, MD       potassium chloride SA (KLOR-CON) CR tablet 40 mEq  40 mEq Oral BID Wyvonnia Dusky, MD   40 mEq at 05/15/21 7893   remdesivir 100 mg in sodium chloride 0.9 % 100 mL IVPB  100 mg Intravenous Daily Lorna Dibble, RPH   Stopped at 05/15/21 1006   sodium chloride flush (NS) 0.9 % injection 3 mL  3 mL Intravenous Q12H Norins, Heinz Knuckles, MD   3 mL at 05/15/21 8101   sodium chloride flush (NS) 0.9 % injection 3 mL  3 mL Intravenous PRN Norins, Heinz Knuckles, MD       Tenofovir Alafenamide Fumarate TABS 25 mg  25 mg Oral Daily Zeigler, Dustin G, RPH       thiamine tablet 100 mg  100 mg Oral Daily Norins, Heinz Knuckles, MD   100 mg at 05/15/21 7510   Or   thiamine (B-1) injection 100 mg  100 mg Intravenous Daily Norins,  Heinz Knuckles, MD         Abtx:  Anti-infectives (From admission, onward)    Start     Dose/Rate Route Frequency Ordered Stop   05/15/21 2200  acyclovir (ZOVIRAX) 200 MG capsule 400 mg        400 mg Oral 2 times daily 05/15/21 1004     05/15/21 1600  Tenofovir Alafenamide Fumarate TABS 25 mg        25 mg Oral Daily 05/15/21 1408     05/15/21 1200  levofloxacin (LEVAQUIN) IVPB 750 mg        750 mg 100 mL/hr over 90 Minutes Intravenous Every 24 hours 05/14/21 1303     05/15/21 1000  remdesivir 100 mg in sodium chloride 0.9 % 100 mL IVPB       See Hyperspace for full Linked Orders Report.   100 mg 200 mL/hr over 30 Minutes Intravenous Daily 05/14/21 1219 05/19/21 0959   05/15/21 0115  acyclovir (ZOVIRAX) tablet 400 mg  Status:  Discontinued        400 mg Oral 2 times daily 05/14/21 1253 05/15/21 1004    05/14/21 1400  remdesivir 200 mg in sodium chloride 0.9% 250 mL IVPB       See Hyperspace for full Linked Orders Report.   200 mg 580 mL/hr over 30 Minutes Intravenous Once 05/14/21 1219 05/14/21 1622   05/14/21 1315  ceFEPIme (MAXIPIME) 2 g in sodium chloride 0.9 % 100 mL IVPB  Status:  Discontinued        2 g 200 mL/hr over 30 Minutes Intravenous Every 24 hours 05/14/21 1302 05/14/21 1303   05/14/21 1300  tenofovir (VIREAD) tablet 300 mg  Status:  Discontinued        300 mg Oral Daily 05/14/21 1253 05/15/21 0143   05/14/21 1230  vancomycin (VANCOCIN) IVPB 1000 mg/200 mL premix       See Hyperspace for full Linked Orders Report.   1,000 mg 200 mL/hr over 60 Minutes Intravenous  Once 05/14/21 1105 05/14/21 1502   05/14/21 1130  levofloxacin (LEVAQUIN) IVPB 750 mg        750 mg 100 mL/hr over 90 Minutes Intravenous  Once 05/14/21 1116 05/14/21 1327   05/14/21 1115  vancomycin (VANCOCIN) IVPB 1000 mg/200 mL premix       See Hyperspace for full Linked Orders Report.   1,000 mg 200 mL/hr over 60 Minutes Intravenous  Once 05/14/21 1105 05/14/21 1235   05/14/21 1100  vancomycin (VANCOCIN) IVPB 1000 mg/200 mL premix  Status:  Discontinued        1,000 mg 200 mL/hr over 60 Minutes Intravenous  Once 05/14/21 1049 05/14/21 1105   05/14/21 1100  levofloxacin (LEVAQUIN) IVPB 750 mg  Status:  Discontinued        750 mg 100 mL/hr over 90 Minutes Intravenous  Once 05/14/21 1049 05/14/21 1112       REVIEW OF SYSTEMS:  Const: Subjective fever,  chills, negative weight loss Eyes: negative diplopia or visual changes, negative eye pain ENT: negative coryza, negative sore throat Resp: Some cough, no hemoptysis, dyspnea Cards: negative for chest pain, palpitations, lower extremity edema GU: negative for frequency, dysuria and hematuria GI: Negative for abdominal pain, diarrhea, bleeding, constipation Skin: negative for rash and pruritus Heme: negative for easy bruising and gum/nose bleeding MS:  Generalized weakness Neurolo:negative for headaches, dizziness, vertigo, memory problems  Psych: negative for feelings of anxiety, depression  Endocrine: negative for thyroid, diabetes Allergy/Immunology-history of allergy to penicillin  as a child but has tolerated amoxicillin and cephalosporin after that. Objective:  VITALS:  BP 118/78   Pulse (!) 111   Temp 98.5 F (36.9 C) (Oral)   Resp 20   Ht 6\' 2"  (1.88 m)   Wt 104.3 kg   SpO2 93%   BMI 29.53 kg/m  PHYSICAL EXAM:  General: Alert, cooperative, no distress, appears stated age.  Head: Normocephalic, without obvious abnormality, atraumatic. Eyes: Conjunctivae clear, anicteric sclerae. Pupils are equal ENT Nares normal. No drainage or sinus tenderness. Lips, mucosa, and tongue normal. No Thrush Neck: Left side of the neck on the upper aspect near the mandible there is a ulcerating wound.  No erythema around it.  Minimal discharge.      No carotid bruit and no JVD. Back: No CVA tenderness. Lungs: Clear to auscultation bilaterally. No Wheezing or Rhonchi. No rales. Heart: Regular rate and rhythm, no murmur, rub or gallop. Port site on the right is clean Abdomen: Soft, non-tender,not distended. Bowel sounds normal. No masses Extremities: atraumatic, no cyanosis. No edema. No clubbing Skin: No rashes or lesions. Or bruising Lymph: Cervical, supraclavicular normal. Neurologic: Grossly non-focal Pertinent Labs Lab Results CBC    Component Value Date/Time   WBC 1.0 (LL) 05/15/2021 0535   RBC 3.25 (L) 05/15/2021 0535   HGB 10.4 (L) 05/15/2021 0535   HGB 18.1 (H) 12/02/2018 1147   HCT 28.3 (L) 05/15/2021 0535   HCT 42.8 02/17/2021 1406   PLT 170 05/15/2021 0535   PLT 223 12/02/2018 1147   MCV 87.1 05/15/2021 0535   MCV 94 12/02/2018 1147   MCV 96 07/19/2013 0548   MCH 32.0 05/15/2021 0535   MCHC 36.7 (H) 05/15/2021 0535   RDW 14.4 05/15/2021 0535   RDW 11.8 (L) 12/02/2018 1147   RDW 12.7 07/19/2013 0548   LYMPHSABS  0.2 (L) 05/14/2021 1059   LYMPHSABS 1.4 12/02/2018 1147   LYMPHSABS 2.4 07/19/2013 0548   MONOABS 0.4 05/14/2021 1059   MONOABS 0.7 07/19/2013 0548   EOSABS 0.0 05/14/2021 1059   EOSABS 0.1 12/02/2018 1147   EOSABS 0.0 07/19/2013 0548   BASOSABS 0.0 05/14/2021 1059   BASOSABS 0.1 12/02/2018 1147   BASOSABS 0.0 07/19/2013 0548    CMP Latest Ref Rng & Units 05/15/2021 05/14/2021 05/06/2021  Glucose 70 - 99 mg/dL 125(H) 125(H) 162(H)  BUN 6 - 20 mg/dL 7 9 12   Creatinine 0.61 - 1.24 mg/dL 0.79 0.86 0.86  Sodium 135 - 145 mmol/L 127(L) 126(L) 130(L)  Potassium 3.5 - 5.1 mmol/L 3.1(L) 3.4(L) 4.4  Chloride 98 - 111 mmol/L 94(L) 91(L) 96(L)  CO2 22 - 32 mmol/L 25 23 24   Calcium 8.9 - 10.3 mg/dL 7.9(L) 8.2(L) 8.2(L)  Total Protein 6.5 - 8.1 g/dL 5.2(L) 5.5(L) 5.7(L)  Total Bilirubin 0.3 - 1.2 mg/dL 1.0 1.4(H) 0.8  Alkaline Phos 38 - 126 U/L 79 95 100  AST 15 - 41 U/L 17 18 19   ALT 0 - 44 U/L 20 24 22       Microbiology: Recent Results (from the past 240 hour(s))  Culture, blood (routine x 2)     Status: None (Preliminary result)   Collection Time: 05/14/21 10:59 AM   Specimen: BLOOD  Result Value Ref Range Status   Specimen Description BLOOD BLOOD RIGHT ARM  Final   Special Requests   Final    BOTTLES DRAWN AEROBIC AND ANAEROBIC Blood Culture results may not be optimal due to an excessive volume of blood received in culture bottles  Culture   Final    NO GROWTH < 24 HOURS Performed at [facility], [address] [place], [place] [address]    Report Status PENDING  Incomplete  Resp Panel by RT-PCR (Flu A&B, Covid) Nasopharyngeal Swab     Status: Abnormal   Collection Time: 05/14/21 10:59 AM   Specimen: Nasopharyngeal Swab; Nasopharyngeal(NP) swabs in vial transport medium  Result Value Ref Range Status   SARS Coronavirus 2 by RT PCR POSITIVE (A) NEGATIVE Final    Comment: RESULT CALLED TO, READ BACK BY AND VERIFIED WITH: ROBIN REGISTER [id number] 05/14/2021  JH (NOTE) SARS-CoV-2 target nucleic acids are DETECTED.  The SARS-CoV-2 RNA is generally detectable in upper respiratory specimens during the acute phase of infection. Positive results are indicative of the presence of the identified virus, but do not rule out bacterial infection or co-infection with other pathogens not detected by the test. Clinical correlation with patient history and other diagnostic information is necessary to determine patient infection status. The expected result is Negative.  Fact Sheet for Patients: EntrepreneurPulse.com.au  Fact Sheet for Healthcare Providers: IncredibleEmployment.be  This test is not yet approved or cleared by the [place] FDA and  has been authorized for detection and/or diagnosis of SARS-CoV-2 by FDA under an Emergency Use Authorization (EUA).  This EUA will remain in effect (meaning this test can b e used) for the duration of  the COVID-19 declaration under Section 564(b)(1) of the Act, 21 U.S.C. section 360bbb-3(b)(1), unless the authorization is terminated or revoked sooner.     Influenza A by PCR NEGATIVE NEGATIVE Final   Influenza B by PCR NEGATIVE NEGATIVE Final    Comment: (NOTE) The Xpert Xpress SARS-CoV-2/FLU/RSV plus assay is intended as an aid in the diagnosis of influenza from Nasopharyngeal swab specimens and should not be used as a sole basis for treatment. Nasal washings and aspirates are unacceptable for Xpert Xpress SARS-CoV-2/FLU/RSV testing.  Fact Sheet for Patients: EntrepreneurPulse.com.au  Fact Sheet for Healthcare Providers: IncredibleEmployment.be  This test is not yet approved or cleared by the [place] FDA and has been authorized for detection and/or diagnosis of SARS-CoV-2 by FDA under an Emergency Use Authorization (EUA). This EUA will remain in effect (meaning this test can be used) for the duration of the COVID-19  declaration under Section 564(b)(1) of the Act, 21 U.S.C. section 360bbb-3(b)(1), unless the authorization is terminated or revoked.  Performed at [facility], [address] [place], [place] [address]   Culture, blood (routine x 2)     Status: None (Preliminary result)   Collection Time: 05/14/21 11:26 AM   Specimen: BLOOD  Result Value Ref Range Status   Specimen Description BLOOD RIGHT ANTECUBITAL  Final   Special Requests   Final    BOTTLES DRAWN AEROBIC AND ANAEROBIC Blood Culture adequate volume   Culture   Final    NO GROWTH < 24 HOURS Performed at [facility], [address], [place], [place] [address]    Report Status PENDING  Incomplete    IMAGING RESULTS:  I have personally reviewed the films ? Impression/Recommendation ? Febrile neutropenia: Will be on G-CSF. Infected SCC wound on the left side of the neck near the mandible.  Pseudomonas putida in the culture.  Currently on Levaquin. Need to change to Zosyn from tomorrow B-cell lymphoma on rituximab and Bendamustine.  Last received rituximab in May. COVID infection.  Was positive initially in Apr 10, 2021. This admission COVID is positive and the CT value  was 28.4 He is asymptomatic.  He is fully vaccinated and posted.  And also has received EVUshield a long acting monoclonal antibody  Hepatitis B core antibody positive.  Surface antibody is more than thousand indicative of prior vaccination.  He is on tenofovir, to prevent reactivation because of rituximab.    PCN allergy reported .  Has tolerated amoxicillin.  Hence unlikely to have allergy . Has tolerated cephalosporins.  Cephalosporins.  ___________________________________________________ Discussed with patient in detail.  Note:  This document was prepared using Dragon voice recognition software and may include unintentional dictation errors.

## 2021-05-15 NOTE — Telephone Encounter (Signed)
Patient called stating that he has been admitted to the hospital after being seen by dermatologist for an infection in a neck wound

## 2021-05-15 NOTE — Progress Notes (Signed)
Order received from Dr Jimmye Norman to discontinue ointments

## 2021-05-16 ENCOUNTER — Encounter: Payer: Self-pay | Admitting: Oncology

## 2021-05-16 DIAGNOSIS — R5081 Fever presenting with conditions classified elsewhere: Secondary | ICD-10-CM

## 2021-05-16 DIAGNOSIS — A4152 Sepsis due to Pseudomonas: Secondary | ICD-10-CM | POA: Diagnosis not present

## 2021-05-16 DIAGNOSIS — D709 Neutropenia, unspecified: Secondary | ICD-10-CM | POA: Diagnosis not present

## 2021-05-16 DIAGNOSIS — L03221 Cellulitis of neck: Secondary | ICD-10-CM | POA: Diagnosis not present

## 2021-05-16 DIAGNOSIS — R6520 Severe sepsis without septic shock: Secondary | ICD-10-CM

## 2021-05-16 DIAGNOSIS — C8330 Diffuse large B-cell lymphoma, unspecified site: Secondary | ICD-10-CM

## 2021-05-16 DIAGNOSIS — C8208 Follicular lymphoma grade I, lymph nodes of multiple sites: Secondary | ICD-10-CM | POA: Diagnosis not present

## 2021-05-16 DIAGNOSIS — J449 Chronic obstructive pulmonary disease, unspecified: Secondary | ICD-10-CM

## 2021-05-16 DIAGNOSIS — C8202 Follicular lymphoma grade I, intrathoracic lymph nodes: Secondary | ICD-10-CM | POA: Diagnosis not present

## 2021-05-16 DIAGNOSIS — C4492 Squamous cell carcinoma of skin, unspecified: Secondary | ICD-10-CM

## 2021-05-16 LAB — BASIC METABOLIC PANEL
Anion gap: 7 (ref 5–15)
BUN: 6 mg/dL (ref 6–20)
CO2: 24 mmol/L (ref 22–32)
Calcium: 7.8 mg/dL — ABNORMAL LOW (ref 8.9–10.3)
Chloride: 97 mmol/L — ABNORMAL LOW (ref 98–111)
Creatinine, Ser: 0.93 mg/dL (ref 0.61–1.24)
GFR, Estimated: >60 mL/min (ref >60)
Glucose, Bld: 168 mg/dL — ABNORMAL HIGH (ref 70–99)
Potassium: 3.8 mmol/L (ref 3.5–5.1)
Sodium: 128 mmol/L — ABNORMAL LOW (ref 135–145)

## 2021-05-16 LAB — CBC
HCT: 27.5 % — ABNORMAL LOW (ref 39.0–52.0)
Hemoglobin: 9.9 g/dL — ABNORMAL LOW (ref 13.0–17.0)
MCH: 32.4 pg (ref 26.0–34.0)
MCHC: 36 g/dL (ref 30.0–36.0)
MCV: 89.9 fL (ref 80.0–100.0)
Platelets: 163 10*3/uL (ref 150–400)
RBC: 3.06 MIL/uL — ABNORMAL LOW (ref 4.22–5.81)
RDW: 14.7 % (ref 11.5–15.5)
WBC: 2.6 10*3/uL — ABNORMAL LOW (ref 4.0–10.5)
nRBC: 0 % (ref 0.0–0.2)

## 2021-05-16 LAB — FERRITIN: Ferritin: 524 ng/mL — ABNORMAL HIGH (ref 24–336)

## 2021-05-16 LAB — MAGNESIUM: Magnesium: 1.7 mg/dL (ref 1.7–2.4)

## 2021-05-16 MED ORDER — FERROUS SULFATE 325 (65 FE) MG PO TABS
325.0000 mg | ORAL_TABLET | Freq: Every day | ORAL | Status: DC
  Administered 2021-05-17 – 2021-05-20 (×4): 325 mg via ORAL
  Filled 2021-05-16 (×4): qty 1

## 2021-05-16 MED ORDER — PIPERACILLIN-TAZOBACTAM 3.375 G IVPB
3.3750 g | Freq: Three times a day (TID) | INTRAVENOUS | Status: DC
  Administered 2021-05-16 – 2021-05-20 (×12): 3.375 g via INTRAVENOUS
  Filled 2021-05-16 (×12): qty 50

## 2021-05-16 NOTE — Care Management Important Message (Signed)
Important Message  Patient Details  Name: Russell Simpson MRN: [id number] Date of Birth: 02-06-1961   Medicare Important Message Given:  N/A - LOS <3 / Initial given by admissions  Initial Medicare IM reviewed with Odelia Gage, daughter by Thornton Dales, Patient Access Associate on 05/15/2021 at 8:39am.   Dannette Barbara 05/16/2021, 8:18 AM

## 2021-05-16 NOTE — Progress Notes (Signed)
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 Pt took bactrim DS initially and then was switched to po cipro after cx results came back outpatient. Dr. Laurence Ferrari (dermatology) was concerned about sepsis so the pt was sent to the ER. Recent wound cx grew pseudomonas putida. Continue on IV zosyn as per ID.   Sepsis: met criteria w/ leukopenia, tachycardia, cellulitis & COVID19. Continue on IV abxs and IV remdesivir.   Neutropenic fever: likely secondary to recent chemo, follicular lymphoma & cellulitits. Continue on IV zosyn as per ID   Follicular lymphoma: recently has chemo (bendamustine, retuximab). Continue on home dose of allopurinol, acyclovir, tenofovir. Onco following and recs apprec   COVID19 infection: not hypoxic and no pneumonia. Vaccinated & boostered as per pt. 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 S/p biopsy. Management per dermatology outpatient   Hypokalemia: WNL today. Will continue to monitor   Hyponatremia: trending up again today. Will continue to monitor   Hypomagnesemia: WNL today   OSA: does not use CPAP  COPD: w/o excerebration. Continue on bronchodilators. Continues to smoke cigars daily    BPH: continue on home dose of finasteride   HTN: continue on home  dose of metoprolol. Will hold home dose of amlodipine   Alcohol abuse: continue on CIWA protocol. Alcohol cessation counseling   DVT prophylaxis: lovenox  Code Status: full  Family Communication:  Disposition Plan: likely d/c back home   Level of care: Med-Surg   Status is: Inpatient  Remains inpatient appropriate because:IV treatments appropriate due to intensity of illness or inability to take PO and Inpatient level of care appropriate due to severity of illness, still spiking fevers   Dispo: The patient is from: Home              Anticipated d/c is to: Home              Patient currently is not medically stable to d/c.   Difficult to place patient : [place]     Consultants:  Onco ID  Procedures:   Antimicrobials: zosyn    Subjective: Pt c/o malaise   Objective: Vitals:   05/15/21 2030 05/16/21 0526 05/16/21 0638 05/16/21 0723  BP: 126/79 102/68 106/68 115/68  Pulse: 95 (!) 117 (!) 109 (!) 104  Resp: $Remo'18 18 18 18  'QyheE$ Temp: (!) 100.8 F (38.2 C) (!) 103.2 F (39.6 C) 99.2 F (37.3 C) 98.6 F (37 C)  TempSrc: Oral     SpO2: 98% 90% 91% 94%  Weight:      Height:        Intake/Output Summary (Last 24 hours) at 05/16/2021 0759 Last data filed at 05/16/2021 0500 Gross per 24 hour  Intake 300.08 ml  Output 1250 ml  Net -949.92 ml   [employer]  05/14/21 1019  Weight: 104.3 kg    Examination:  General exam: Appears uncomfortable  Respiratory system: diminished breath sounds b/l otherwise clear Cardiovascular system: S1/S2+. No clicks or rubs  Gastrointestinal system: Abd is soft, NT, ND & hypoactive bowel sounds  Central nervous system: Alert and oriented. Moves all extremities  Psychiatry: Judgement and insight appear normal. Flat mood and affect     Data Reviewed: I have personally reviewed following labs and imaging studies  CBC: Recent Labs  Lab 05/14/21 1059 05/15/21 0535 05/16/21 0629  WBC 1.4* 1.0* 2.6*  NEUTROABS 0.8*  --   --    HGB 11.3* 10.4* 9.9*  HCT 31.1* 28.3* 27.5*  MCV 88.4 87.1 89.9  PLT 176 170 409   Basic Metabolic Panel: Recent Labs  Lab 05/14/21 1059 05/14/21 1510 05/15/21 0535 05/16/21 0629  NA 126*  --  127* 128*  K 3.4*  --  3.1* 3.8  CL 91*  --  94* 97*  CO2 23  --  25 24  GLUCOSE 125*  --  125* 168*  BUN 9  --  7 6  CREATININE 0.86  --  0.79 0.93  CALCIUM 8.2*  --  7.9* 7.8*  MG  --  1.4* 1.5* 1.7  PHOS  --  3.4  --   --    GFR: Estimated Creatinine Clearance: 111.4 mL/min (by C-G formula based on SCr of 0.93 mg/dL). Liver Function Tests: Recent Labs  Lab 05/14/21 1059 05/15/21 0535  AST 18 17  ALT 24 20  ALKPHOS 95 79  BILITOT 1.4* 1.0  PROT 5.5* 5.2*  ALBUMIN 3.4* 3.0*   No results for input(s): LIPASE, AMYLASE in the last 168 hours. No results for input(s): AMMONIA in the last 168 hours. Coagulation Profile: No results for input(s): INR, PROTIME in the last 168 hours. Cardiac Enzymes: No results for input(s): CKTOTAL, CKMB, CKMBINDEX, TROPONINI in the last 168 hours. BNP (last 3 results) No results for input(s): PROBNP in the last 8760 hours. HbA1C: No results for input(s): HGBA1C in the last 72 hours. CBG: No results for input(s): GLUCAP in the last 168 hours. Lipid Profile: No results for input(s): CHOL, HDL, LDLCALC, TRIG, CHOLHDL, LDLDIRECT in the last 72 hours. Thyroid Function Tests: No results for input(s): TSH, T4TOTAL, FREET4, T3FREE, THYROIDAB in the last 72 hours. Anemia Panel: No results for input(s): VITAMINB12, FOLATE, FERRITIN, TIBC, IRON, RETICCTPCT in the last 72 hours. Sepsis Labs: Recent Labs  Lab 05/14/21 1059  LATICACIDVEN 0.8    Recent Results (from the past 240 hour(s))  Culture, blood (routine x 2)     Status: None (Preliminary result)   Collection Time: 05/14/21 10:59 AM   Specimen: BLOOD  Result Value Ref Range Status   Specimen Description BLOOD BLOOD RIGHT ARM  Final   Special Requests   Final    BOTTLES DRAWN AEROBIC AND  ANAEROBIC Blood Culture results may not be optimal due to an excessive volume of blood received in culture bottles   Culture   Final    NO GROWTH < 24 HOURS Performed at [facility], [address] [place], [place] [address]    Report Status PENDING  Incomplete  Resp Panel by RT-PCR (Flu A&B, Covid) Nasopharyngeal Swab     Status: Abnormal   Collection Time: 05/14/21 10:59 AM   Specimen: Nasopharyngeal Swab; Nasopharyngeal(NP) swabs in vial transport medium  Result Value Ref Range Status   SARS Coronavirus 2 by RT PCR POSITIVE (A) NEGATIVE Final  Comment: RESULT CALLED TO, READ BACK BY AND VERIFIED WITH: ROBIN REGISTER [id number] 05/14/2021 J[facility] (NOTE) SARS-CoV-2 target nucleic acids are DETECTED.  The SARS-CoV-2 RNA is generally detectable in upper respiratory specimens during the acute phase of infection. Positive results are indicative of the presence of the identified virus, but do not rule out bacterial infection or co-infection with other pathogens not detected by the test. Clinical correlation with patient history and other diagnostic information is necessary to determine patient infection status. The expected result is Negative.  Fact Sheet for Patients: EntrepreneurPulse.com.au  Fact Sheet for Healthcare Providers: IncredibleEmployment.be  This test is not yet approved or cleared by the [place] FDA and  has been authorized for detection and/or diagnosis of SARS-CoV-2 by FDA under an Emergency Use Authorization (EUA).  This EUA will remain in effect (meaning this test can b e used) for the duration of  the COVID-19 declaration under Section 564(b)(1) of the Act, 21 U.S.C. section 360bbb-3(b)(1), unless the authorization is terminated or revoked sooner.     Influenza A by PCR NEGATIVE NEGATIVE Final   Influenza B by PCR NEGATIVE NEGATIVE Final    Comment: (NOTE) The Xpert Xpress SARS-CoV-2/FLU/RSV plus assay is intended  as an aid in the diagnosis of influenza from Nasopharyngeal swab specimens and should not be used as a sole basis for treatment. Nasal washings and aspirates are unacceptable for Xpert Xpress SARS-CoV-2/FLU/RSV testing.  Fact Sheet for Patients: EntrepreneurPulse.com.au  Fact Sheet for Healthcare Providers: IncredibleEmployment.be  This test is not yet approved or cleared by the [place] FDA and has been authorized for detection and/or diagnosis of SARS-CoV-2 by FDA under an Emergency Use Authorization (EUA). This EUA will remain in effect (meaning this test can be used) for the duration of the COVID-19 declaration under Section 564(b)(1) of the Act, 21 U.S.C. section 360bbb-3(b)(1), unless the authorization is terminated or revoked.  Performed at [facility], [address] [place], [place] [address]   Culture, blood (routine x 2)     Status: None (Preliminary result)   Collection Time: 05/14/21 11:26 AM   Specimen: BLOOD  Result Value Ref Range Status   Specimen Description BLOOD RIGHT ANTECUBITAL  Final   Special Requests   Final    BOTTLES DRAWN AEROBIC AND ANAEROBIC Blood Culture adequate volume   Culture   Final    NO GROWTH < 24 HOURS Performed at [facility], [address], [place], [place] [address]    Report Status PENDING  Incomplete         Radiology Studies: DG Chest Portable 1 View  Result Date: 05/14/2021 CLINICAL DATA:  Weakness and tachycardia.  Lymphoma EXAM: PORTABLE CHEST 1 VIEW COMPARISON:  01/06/2014 FINDINGS: Cardiac and mediastinal contours normal. No adenopathy or mass. Lungs well aerated and clear Right jugular Port-A-Cath in place. No priors for comparison for catheter positioning. The catheter loops in the right internal jugular vein with the tip in the region of the right innominate vein. IMPRESSION: No acute abnormality. Right jugular Port-A-Cath tip in the region of the right  innominate vein. Electronically Signed   By: Franchot Gallo M.D.   On: 05/14/2021 11:33        Scheduled Meds:  acyclovir  400 mg Oral BID   allopurinol  300 mg Oral Daily   amLODipine  5 mg Oral Daily   atorvastatin  40 mg Oral Daily   buPROPion  150 mg Oral q morning   clonazePAM  0.5 mg Oral Daily  DULoxetine  60 mg Oral Daily   enoxaparin (LOVENOX) injection  50 mg Subcutaneous Q24H   finasteride  5 mg Oral Daily   fluticasone furoate-vilanterol  1 puff Inhalation Daily   folic acid  1 mg Oral Daily   loratadine  10 mg Oral Daily   LORazepam  0-4 mg Oral Q6H   Followed by   LORazepam  0-4 mg Oral Q12H   metoprolol succinate  25 mg Oral Daily   mirtazapine  15 mg Oral QHS   montelukast  10 mg Oral QHS   multivitamin with minerals  1 tablet Oral Daily   mupirocin ointment  1 application Topical BID   OLANZapine  7.5 mg Oral QHS   pantoprazole  40 mg Oral Daily   pneumococcal 23 valent vaccine  0.5 mL Intramuscular Tomorrow-1000   sodium chloride flush  3 mL Intravenous Q12H   Tbo-Filgrastim  480 mcg Subcutaneous Daily   Tenofovir Alafenamide Fumarate  25 mg Oral Daily   thiamine  100 mg Oral Daily   Or   thiamine  100 mg Intravenous Daily   Continuous Infusions:  sodium chloride     levofloxacin (LEVAQUIN) IV Stopped (05/15/21 1347)   remdesivir 100 mg in NS 100 mL Stopped (05/15/21 1006)     LOS: 2 days    Time spent:33 mins     Wyvonnia Dusky, MD [facility] Pager [phone]  If 7PM-7AM, please contact night-coverage  05/16/2021, 7:59 AM

## 2021-05-16 NOTE — Progress Notes (Signed)
Date of Admission:  05/14/2021    ID: Russell Simpson is a 60 y.o. 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)   Stage 2 moderate COPD by GOLD classification (HCC)   Cellulitis   Cellulitis, neck   COVID-19 virus infection   Alcohol use disorder, severe, dependence (HCC)   Chemotherapy induced neutropenia (HCC)    Subjective: Pt is feeling better today Had fever last night of 103 No cough, sob, nausea, diarrhea  Medications:   acyclovir  400 mg Oral BID   allopurinol  300 mg Oral Daily   amLODipine  5 mg Oral Daily   atorvastatin  40 mg Oral Daily   buPROPion  150 mg Oral q morning   clonazePAM  0.5 mg Oral Daily   DULoxetine  60 mg Oral Daily   enoxaparin (LOVENOX) injection  50 mg Subcutaneous Q24H   finasteride  5 mg Oral Daily   fluticasone furoate-vilanterol  1 puff Inhalation Daily   folic acid  1 mg Oral Daily   loratadine  10 mg Oral Daily   LORazepam  0-4 mg Oral Q6H   Followed by   LORazepam  0-4 mg Oral Q12H   metoprolol succinate  25 mg Oral Daily   mirtazapine  15 mg Oral QHS   montelukast  10 mg Oral QHS   multivitamin with minerals  1 tablet Oral Daily   mupirocin ointment  1 application Topical BID   OLANZapine  7.5 mg Oral QHS   pantoprazole  40 mg Oral Daily   pneumococcal 23 valent vaccine  0.5 mL Intramuscular Tomorrow-1000   sodium chloride flush  3 mL Intravenous Q12H   Tbo-Filgrastim  480 mcg Subcutaneous Daily   Tenofovir Alafenamide Fumarate  25 mg Oral Daily   thiamine  100 mg Oral Daily   Or   thiamine  100 mg Intravenous Daily    Objective: Vital signs in last 24 hours: Patient Vitals for the past 24 hrs:  BP Temp Temp src Pulse Resp SpO2  05/16/21 1137 (!) 142/88 (!) 101.6 F (38.7 C) -- (!) 105 20 91 %  05/16/21 0956  97/67 98 F (36.7 C) Oral (!) 104 18 --  05/16/21 0723 115/68 98.6 F (37 C) -- (!) 104 18 94 %  05/16/21 0638 106/68 99.2 F (37.3 C) -- (!) 109 18 91 %  05/16/21 0526 102/68 (!) 103.2 F (39.6 C) -- (!) 117 18 90 %  05/15/21 2030 126/79 (!) 100.8 F (38.2 C) Oral 95 18 98 %  05/15/21 1222 118/78 98.5 F (36.9 C) Oral (!) 111 20 93 %    PHYSICAL EXAM:  General: Alert, cooperative, no distress, appears stated age.  Head: Normocephalic, without obvious abnormality, atraumatic. Eyes: Conjunctivae clear, anicteric sclerae. Pupils are equal ENT Nares normal. No drainage or sinus tenderness. Lips, mucosa, and tongue normal. No Thrush Neck: ulcerated wound Back: No CVA tenderness. Lungs: Clear to auscultation bilaterally. No Wheezing or Rhonchi. No rales. Heart: Regular rate and rhythm, no murmur, rub or gallop. Port in place Abdomen: Soft, non-tender,not distended. Bowel sounds normal. No masses Extremities: atraumatic, no cyanosis. No edema. No clubbing Skin: No rashes or lesions. Or bruising Lymph: Cervical, supraclavicular normal. Neurologic: Grossly non-focal  Lab Results Recent Labs    05/15/21 0535 05/16/21  0629  WBC 1.0* 2.6*  HGB 10.4* 9.9*  HCT 28.3* 27.5*  NA 127* 128*  K 3.1* 3.8  CL 94* 97*  CO2 25 24  BUN 7 6  CREATININE 0.79 0.93   Liver Panel Recent Labs    05/14/21 1059 05/15/21 0535  PROT 5.5* 5.2*  ALBUMIN 3.4* 3.0*  AST 18 17  ALT 24 20  ALKPHOS 95 79  BILITOT 1.4* 1.0    Microbiology: [facility] NG  CXR - no acute abnormality  Assessment/Plan: Febrile neutropenia- counts improving Left neck wound from ulcerated SCC Pseudomonas in culture- levaquin changed to zosyn  B cell lymphoma on rituximab ( last dose May 10) and Bendamustine last dose June 7/8  Covid virla illness- first tested positive  May 12- fully vaccinated and also received Evushield in April 2022. Now no resp symptoms. Ct value is 28 indicating fairly replicating  virus-  getting Remdisvir.  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

## 2021-05-16 NOTE — Progress Notes (Signed)
Pharmacy - Brief Note Antibiotic allergy clarification  Spoke to patient on phone and confirms he has tolerated cefdinir, cephalexin, and amoxicillin/clavulanate in past.  PCN allergy listed but since tolerated amoxicillin, does not appear to be significant at this time.  Start piperacillin/tazobactam as per orders based on above information and tolerating amoxicillin.   Doreene Eland, PharmD, BCPS.   Work Cell: [phone] 05/16/2021 11:13 AM

## 2021-05-16 NOTE — Progress Notes (Signed)
   05/14/21 1646  Assess: MEWS Score  Temp 98.2 F (36.8 C)  BP (!) 123/93  Pulse Rate (!) 127  Resp 18  SpO2 100 %  Assess: MEWS Score  MEWS Temp 0  MEWS Systolic 0  MEWS Pulse 2  MEWS RR 0  MEWS LOC 0  MEWS Score 2  MEWS Score Color Yellow  Assess: if the MEWS score is Yellow or Red  Were vital signs taken at a resting state? Yes  Focused Assessment No change from prior assessment  Early Detection of Sepsis Score *See Row Information* Low  MEWS guidelines implemented *See Row Information* Yes  Treat  MEWS Interventions Other (Comment) (No new orders received from MD)  Pain Scale 0-10  Take Vital Signs  Increase Vital Sign Frequency  Yellow: Q 2hr X 2 then Q 4hr X 2, if remains yellow, continue Q 4hrs  Escalate  MEWS: Escalate Yellow: discuss with charge nurse/RN and consider discussing with provider and RRT  Notify: Charge Nurse/RN  Name of Charge Nurse/RN Notified Cindie Laroche  Date Charge Nurse/RN Notified 05/14/21  Time Charge Nurse/RN Notified 1646  Inserted for [employer] RN

## 2021-05-16 NOTE — Progress Notes (Signed)
Hematology/Oncology Consult note [facility]  [phone] [phone]  Patient Care Team: Charlynne Cousins, MD as PCP - General (Internal Medicine) Kate Sable, MD as PCP - Cardiology (Cardiology) Sindy Guadeloupe, MD as Consulting Physician (Oncology)   Name of the patient: Russell Simpson  [id number]  03-Sep-1961   Date of visit: 05/16/2021    Interval history- feels better since admission. Feels stronger and can eat better but remains intermittently febrile   Review of systems- Review of Systems  Constitutional:  Positive for fever and malaise/fatigue. Negative for chills and weight loss.  HENT:  Negative for congestion, ear discharge and nosebleeds.   Eyes:  Negative for blurred vision.  Respiratory:  Negative for cough, hemoptysis, sputum production, shortness of breath and wheezing.   Cardiovascular:  Negative for chest pain, palpitations, orthopnea and claudication.  Gastrointestinal:  Negative for abdominal pain, blood in stool, constipation, diarrhea, heartburn, melena, nausea and vomiting.  Genitourinary:  Negative for dysuria, flank pain, frequency, hematuria and urgency.  Musculoskeletal:  Negative for back pain, joint pain and myalgias.  Skin:  Negative for rash.  Neurological:  Negative for dizziness, tingling, focal weakness, seizures, weakness and headaches.  Endo/Heme/Allergies:  Does not bruise/bleed easily.  Psychiatric/Behavioral:  Negative for depression and suicidal ideas. The patient does not have insomnia.       Allergies  Allergen Reactions   Penicillins Anaphylaxis    Tolerates cefdinir, cephalexin and amoxicillin/clavulonate so true PCN allergy unlikely   Tetanus Toxoids Swelling   Lisinopril Cough   Losartan      Other reaction(s): Muscle Pain     Tetanus Toxoid    Codeine Nausea Only and Nausea And Vomiting   Doxycycline Rash   Ruxience [Rituximab-Pvvr] Rash    05/06/21 pt w/ worsening rash during  Ruxience infusion.  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   Chronic pain    right arm   Depression    Environmental and seasonal allergies    Follicular lymphoma ([place])    Hypertension    Pneumonia    Sleep apnea    does not wear CPAP ; uses humidifier instead   Squamous cell carcinoma of skin 02/19/2021   L inferior mandible, treated with [facility]      Past Surgical History:  Procedure Laterality Date   ANTERIOR CERVICAL DECOMP/DISCECTOMY FUSION N/A 06/17/2016   Procedure: ANTERIOR CERVICAL DECOMPRESSION FUSION, CERVICAL 3-4, CERVICAL 4-5 WITH INSTRUMENTATION AND ALLOGRAFT;  Surgeon: Phylliss Bob, MD;  Location: [place];  Service: Orthopedics;  Laterality: N/A;  ANTERIOR CERVICAL DECOMPRESSION FUSION, CERVICAL 3-4, CERVICAL 4-5 WITH INSTRUMENTATION AND ALLOGRAFT   BACK SURGERY     NECK SURGERY  2009   PORTA CATH INSERTION N/A 04/03/2021   Procedure: PORTA CATH INSERTION;  Surgeon: Algernon Huxley, MD;  Location: [place] [facility];  Service: Cardiovascular;  Laterality: N/A;    Social History   Socioeconomic History   Marital status: Divorced    Spouse name: Not on file   Number of children: Not on file   Years of education: Not on file   Highest education level: Not on file  Occupational History   Not on file  Tobacco Use  Smoking status: Every Day    Pack years: 0.00    Types: Cigars   Smokeless tobacco: Never   Tobacco comments:    5-6 cigars a day  Vaping Use   Vaping Use: Former   Start date: 04/30/2017   Quit date: 11/30/2017  Substance and Sexual Activity   Alcohol use: Not Currently    Alcohol/week: 0.0 standard drinks   Drug use: No   Sexual activity: Not Currently  Other Topics Concern   Not on file  Social History Narrative   Not on  file   Social Determinants of Health   Financial Resource Strain: Not on file  Food Insecurity: Not on file  Transportation Needs: Not on file  Physical Activity: Not on file  Stress: Not on file  Social Connections: Not on file  Intimate Partner Violence: Not on file    Family History  Problem Relation Age of Onset   Diabetes Mother    Diabetes Other      Current Facility-Administered Medications:    0.9 %  sodium chloride infusion, 250 mL, Intravenous, PRN, Norins, Heinz Knuckles, MD   acetaminophen (TYLENOL) tablet 650 mg, 650 mg, Oral, Q6H PRN, 650 mg at 05/16/21 0550 **OR** acetaminophen (TYLENOL) suppository 650 mg, 650 mg, Rectal, Q6H PRN, Norins, Heinz Knuckles, MD   acyclovir (ZOVIRAX) 200 MG capsule 400 mg, 400 mg, Oral, BID, Hallaji, Sheema M, RPH, 400 mg at 05/16/21 0946   allopurinol (ZYLOPRIM) tablet 300 mg, 300 mg, Oral, Daily, Norins, Heinz Knuckles, MD, 300 mg at 05/16/21 0944   atorvastatin (LIPITOR) tablet 40 mg, 40 mg, Oral, Daily, Norins, Heinz Knuckles, MD, 40 mg at 05/16/21 0943   buPROPion (WELLBUTRIN XL) 24 hr tablet 150 mg, 150 mg, Oral, q morning, Norins, Heinz Knuckles, MD, 150 mg at 05/16/21 3536   clonazePAM (KLONOPIN) tablet 0.5 mg, 0.5 mg, Oral, Daily, Norins, Heinz Knuckles, MD, 0.5 mg at 05/16/21 0950   DULoxetine (CYMBALTA) DR capsule 60 mg, 60 mg, Oral, Daily, Norins, Heinz Knuckles, MD, 60 mg at 05/16/21 0944   enoxaparin (LOVENOX) injection 50 mg, 50 mg, Subcutaneous, Q24H, Norins, Heinz Knuckles, MD, 50 mg at 05/15/21 2130   [START ON 05/17/2021] ferrous sulfate tablet 325 mg, 325 mg, Oral, Q breakfast, Jimmye Norman, Jamiese M, MD   finasteride (PROSCAR) tablet 5 mg, 5 mg, Oral, Daily, Norins, Heinz Knuckles, MD, 5 mg at 05/16/21 0948   fluticasone furoate-vilanterol (BREO ELLIPTA) 100-25 MCG/INH 1 puff, 1 puff, Inhalation, Daily, Norins, Heinz Knuckles, MD, 1 puff at 14/43/15 4008   folic acid (FOLVITE) tablet 1 mg, 1 mg, Oral, Daily, Norins, Heinz Knuckles, MD, 1 mg at 05/16/21 0943   ketorolac  (TORADOL) 30 MG/ML injection 30 mg, 30 mg, Intravenous, Q6H PRN, Norins, Heinz Knuckles, MD   loratadine (CLARITIN) tablet 10 mg, 10 mg, Oral, Daily, Norins, Heinz Knuckles, MD, 10 mg at 05/16/21 0942   LORazepam (ATIVAN) tablet 1-4 mg, 1-4 mg, Oral, Q1H PRN, 1 mg at 05/16/21 0550 **OR** LORazepam (ATIVAN) injection 1-4 mg, 1-4 mg, Intravenous, Q1H PRN, Norins, Heinz Knuckles, MD   [EXPIRED] LORazepam (ATIVAN) tablet 0-4 mg, 0-4 mg, Oral, Q6H **FOLLOWED BY** LORazepam (ATIVAN) tablet 0-4 mg, 0-4 mg, Oral, Q12H, Norins, Heinz Knuckles, MD   metoprolol succinate (TOPROL-XL) 24 hr tablet 25 mg, 25 mg, Oral, Daily, Norins, Heinz Knuckles, MD, 25 mg at 05/16/21 0943   mirtazapine (REMERON) tablet 15 mg, 15 mg, Oral, QHS, Norins, Heinz Knuckles, MD, 15 mg at 05/15/21 2129   montelukast (SINGULAIR) tablet  10 mg, 10 mg, Oral, QHS, Norins, Heinz Knuckles, MD, 10 mg at 05/15/21 2128   multivitamin with minerals tablet 1 tablet, 1 tablet, Oral, Daily, Norins, Heinz Knuckles, MD, 1 tablet at 05/16/21 0943   mupirocin ointment (BACTROBAN) 2 % 1 application, 1 application, Topical, BID, Norins, Heinz Knuckles, MD, 1 application at 92/42/68 0950   OLANZapine (ZYPREXA) tablet 7.5 mg, 7.5 mg, Oral, QHS, Norins, Heinz Knuckles, MD, 7.5 mg at 05/15/21 2129   ondansetron (ZOFRAN) injection 4 mg, 4 mg, Intravenous, Q4H PRN, Mansy, Jan A, MD, 4 mg at 05/15/21 2128   pantoprazole (PROTONIX) EC tablet 40 mg, 40 mg, Oral, Daily, Norins, Heinz Knuckles, MD, 40 mg at 05/16/21 0947   piperacillin-tazobactam (ZOSYN) IVPB 3.375 g, 3.375 g, Intravenous, Q8H, Ravishankar, Jayashree, MD, Last Rate: 12.5 mL/hr at 05/16/21 1635, Infusion Verify at 05/16/21 1635   pneumococcal 23 valent vaccine (PNEUMOVAX-23) injection 0.5 mL, 0.5 mL, Intramuscular, Tomorrow-1000, Norins, Heinz Knuckles, MD   [COMPLETED] remdesivir 200 mg in sodium chloride 0.9% 250 mL IVPB, 200 mg, Intravenous, Once, Stopped at 05/14/21 1622 **FOLLOWED BY** remdesivir 100 mg in sodium chloride 0.9 % 100 mL IVPB, 100 mg,  Intravenous, Daily, Beers, Shanon Brow, RPH, Stopped at 05/16/21 1024   sodium chloride flush (NS) 0.9 % injection 3 mL, 3 mL, Intravenous, Q12H, Norins, Heinz Knuckles, MD, 3 mL at 05/16/21 0951   sodium chloride flush (NS) 0.9 % injection 3 mL, 3 mL, Intravenous, PRN, Norins, Heinz Knuckles, MD   Tbo-Filgrastim [facility]) injection 480 mcg, 480 mcg, Subcutaneous, Daily, Verlon Au, NP, 480 mcg at 05/16/21 1328   Tenofovir Alafenamide Fumarate TABS 25 mg, 25 mg, Oral, Daily, Berton Mount, RPH, 25 mg at 05/16/21 0949   thiamine tablet 100 mg, 100 mg, Oral, Daily, 100 mg at 05/16/21 0947 **OR** thiamine (B-1) injection 100 mg, 100 mg, Intravenous, Daily, Norins, Heinz Knuckles, MD  Physical exam:  Vitals:   05/16/21 0723 05/16/21 0956 05/16/21 1137 05/16/21 1715  BP: 115/68 97/67 (!) 142/88 121/77  Pulse: (!) 104 (!) 104 (!) 105 (!) 110  Resp: 18 18 20    Temp: 98.6 F (37 C) 98 F (36.7 C) (!) 101.6 F (38.7 C) (!) 100.8 F (38.2 C)  TempSrc:  Oral  Oral  SpO2: 94%  91%   Weight:      Height:       Physical Exam Constitutional:      General: He is not in acute distress. HENT:     Mouth/Throat:     Mouth: Mucous membranes are moist.     Pharynx: Oropharynx is clear.  Neck:     Comments: Oval ulceration noted over left neck with serous discharge Cardiovascular:     Rate and Rhythm: Regular rhythm. Tachycardia present.     Heart sounds: Normal heart sounds.  Pulmonary:     Effort: Pulmonary effort is normal.     Breath sounds: Normal breath sounds.  Abdominal:     General: Bowel sounds are normal.     Palpations: Abdomen is soft.  Skin:    General: Skin is warm and dry.  Neurological:     Mental Status: He is alert and oriented to person, place, and time.     CMP Latest Ref Rng & Units 05/16/2021  Glucose 70 - 99 mg/dL 168(H)  BUN 6 - 20 mg/dL 6  Creatinine 0.61 - 1.24 mg/dL 0.93  Sodium 135 - 145 mmol/L 128(L)  Potassium 3.5 - 5.1 mmol/L 3.8  Chloride 98 -  111 mmol/L 97(L)   CO2 22 - 32 mmol/L 24  Calcium 8.9 - 10.3 mg/dL 7.8(L)  Total Protein 6.5 - 8.1 g/dL -  Total Bilirubin 0.3 - 1.2 mg/dL -  Alkaline Phos 38 - 126 U/L -  AST 15 - 41 U/L -  ALT 0 - 44 U/L -   CBC Latest Ref Rng & Units 05/16/2021  WBC 4.0 - 10.5 K/uL 2.6(L)  Hemoglobin 13.0 - 17.0 g/dL 9.9(L)  Hematocrit 39.0 - 52.0 % 27.5(L)  Platelets 150 - 400 K/uL 163    @IMAGES @  DG Chest Portable 1 View  Result Date: 05/14/2021 CLINICAL DATA:  Weakness and tachycardia.  Lymphoma EXAM: PORTABLE CHEST 1 VIEW COMPARISON:  01/06/2014 FINDINGS: Cardiac and mediastinal contours normal. No adenopathy or mass. Lungs well aerated and clear Right jugular Port-A-Cath in place. No priors for comparison for catheter positioning. The catheter loops in the right internal jugular vein with the tip in the region of the right innominate vein. IMPRESSION: No acute abnormality. Right jugular Port-A-Cath tip in the region of the right innominate vein. Electronically Signed   By: Franchot Gallo M.D.   On: 05/14/2021 11:33     Assessment and plan- Patient is a 60 y.o. male With stage IV low-grade follicular lymphoma currently on Bendamustine Rituxan chemotherapy.  He is currently admitted for infected neck wound complicated by neutropenia.  Febrile neutropenia: White cell count has improved today but we will continue Neupogen at this time until [facility] is greater than 5.  Repeat CBC with differential in AM  Infected left neck wound/SCC: Currently on Zosyn.  ID on board patient had a T-max of 101.6 this morning at 1137.  Repeat temperature at 1715 was still elevated at 100.8.  History of follicular lymphoma: Cycle 2 of Bendamustine and Rituxan was given on 05/06/2021.  However patient did not receive full dose of Rituxan given infusion reaction.  Neulasta is typically not offered with Bendamustine Rituxan regimen.  Given ongoing febrile neutropenia he is on Neupogen.  Further treatment will be on hold until acute issues  resolved.  Patient will continue to remain on acyclovir allopurinol and tenofovir prophylaxis.  Patient also tested positive for COVID during this admission.  However he was positive in May 2022 as well.  He has also received Eville shield prophylaxis and outpatient.  Will continue to follow Visit Diagnosis 1. Cellulitis of neck   2. Sepsis due to Pseudomonas species, unspecified whether acute organ dysfunction present ([place])   3. Failure of outpatient treatment   4. Stage 2 moderate COPD by GOLD classification ([place])   5. Squamous cell carcinoma of neck   6. Chemotherapy induced neutropenia (HCC)      Dr. Randa Evens, MD, MPH [facility] at [facility] [phone] 05/16/2021 6:07 PM

## 2021-05-16 NOTE — Progress Notes (Signed)
   05/16/21 0526 05/16/21 8757  Assess: MEWS Score  Temp (!) 103.2 F (39.6 C) (Notified nurse, Tandra Rosado - Donna Williford, NT) 99.2 F (37.3 C)  BP 102/68 106/68  Pulse Rate (!) 117 (Notified nurse, Irelynd Zumstein. -Donna Williford, NT) (!) 109  Resp 18 18  SpO2 90 % 91 %  O2 Device Room Air Room Air  Assess: MEWS Score  MEWS Temp 2 0  MEWS Systolic 0 0  MEWS Pulse 2 1  MEWS RR 0 0  MEWS LOC 0 0  MEWS Score 4 1  MEWS Score Color Red Green  Assess: if the MEWS score is Yellow or Red  Were vital signs taken at a resting state? Yes  --   Focused Assessment No change from prior assessment  --   Early Detection of Sepsis Score *See Row Information* High  --   MEWS guidelines implemented *See Row Information* Yes  --   Treat  MEWS Interventions Administered prn meds/treatments;Escalated (See documentation below)  --   Pain Scale 0-10  --   Pain Score 0  --   Take Vital Signs  Increase Vital Sign Frequency  Red: Q 1hr X 4 then Q 4hr X 4, if remains red, continue Q 4hrs  --   Escalate  MEWS: Escalate Red: discuss with charge nurse/RN and provider, consider discussing with RRT  --   Notify: Charge Nurse/RN  Name of Charge Nurse/RN Notified Kaira, RN  --   Date Charge Nurse/RN Notified 05/16/21  --   Time Charge Nurse/RN Notified 0530  --   Notify: Provider  Provider Name/Title Sidney Ace, MD  --   Date Provider Notified 05/16/21  --   Time Provider Notified [phone]  --   Notification Type Page  --   Notification Reason Change in status  --   Provider response See new orders  --   Date of Provider Response 05/16/21  --   Time of Provider Response 0541  --   Document  Patient Outcome  --  Stabilized after interventions (continuing Q1 MEWS)

## 2021-05-17 DIAGNOSIS — D701 Agranulocytosis secondary to cancer chemotherapy: Secondary | ICD-10-CM | POA: Diagnosis not present

## 2021-05-17 DIAGNOSIS — C8202 Follicular lymphoma grade I, intrathoracic lymph nodes: Secondary | ICD-10-CM | POA: Diagnosis not present

## 2021-05-17 DIAGNOSIS — L03221 Cellulitis of neck: Secondary | ICD-10-CM | POA: Diagnosis not present

## 2021-05-17 DIAGNOSIS — R5081 Fever presenting with conditions classified elsewhere: Secondary | ICD-10-CM | POA: Diagnosis not present

## 2021-05-17 DIAGNOSIS — D709 Neutropenia, unspecified: Secondary | ICD-10-CM | POA: Diagnosis not present

## 2021-05-17 DIAGNOSIS — C4442 Squamous cell carcinoma of skin of scalp and neck: Secondary | ICD-10-CM

## 2021-05-17 LAB — CBC WITH DIFFERENTIAL/PLATELET
Abs Immature Granulocytes: 0.13 10*3/uL — ABNORMAL HIGH (ref 0.00–0.07)
Basophils Absolute: 0 10*3/uL (ref 0.0–0.1)
Basophils Relative: 1 %
Eosinophils Absolute: 0 10*3/uL (ref 0.0–0.5)
Eosinophils Relative: 0 %
HCT: 27.9 % — ABNORMAL LOW (ref 39.0–52.0)
Hemoglobin: 10.4 g/dL — ABNORMAL LOW (ref 13.0–17.0)
Immature Granulocytes: 3 %
Lymphocytes Relative: 16 %
Lymphs Abs: 0.6 10*3/uL — ABNORMAL LOW (ref 0.7–4.0)
MCH: 33 pg (ref 26.0–34.0)
MCHC: 37.3 g/dL — ABNORMAL HIGH (ref 30.0–36.0)
MCV: 88.6 fL (ref 80.0–100.0)
Monocytes Absolute: 0.8 10*3/uL (ref 0.1–1.0)
Monocytes Relative: 20 %
Neutro Abs: 2.4 10*3/uL (ref 1.7–7.7)
Neutrophils Relative %: 60 %
Platelets: 174 10*3/uL (ref 150–400)
RBC: 3.15 MIL/uL — ABNORMAL LOW (ref 4.22–5.81)
RDW: 14.7 % (ref 11.5–15.5)
Smear Review: NORMAL
WBC: 3.9 10*3/uL — ABNORMAL LOW (ref 4.0–10.5)
nRBC: 0 % (ref 0.0–0.2)

## 2021-05-17 LAB — BASIC METABOLIC PANEL
Anion gap: 6 (ref 5–15)
BUN: 8 mg/dL (ref 6–20)
CO2: 25 mmol/L (ref 22–32)
Calcium: 7.8 mg/dL — ABNORMAL LOW (ref 8.9–10.3)
Chloride: 96 mmol/L — ABNORMAL LOW (ref 98–111)
Creatinine, Ser: 0.89 mg/dL (ref 0.61–1.24)
GFR, Estimated: >60 mL/min (ref >60)
Glucose, Bld: 102 mg/dL — ABNORMAL HIGH (ref 70–99)
Potassium: 3.7 mmol/L (ref 3.5–5.1)
Sodium: 127 mmol/L — ABNORMAL LOW (ref 135–145)

## 2021-05-17 LAB — MAGNESIUM: Magnesium: 1.6 mg/dL — ABNORMAL LOW (ref 1.7–2.4)

## 2021-05-17 MED ORDER — SODIUM CHLORIDE 0.9 % IV SOLN: INTRAVENOUS | Status: DC

## 2021-05-17 MED ORDER — MAGNESIUM SULFATE 2 GM/50ML IV SOLN
2.0000 g | Freq: Once | INTRAVENOUS | Status: AC
  Administered 2021-05-17: 2 g via INTRAVENOUS
  Filled 2021-05-17: qty 50

## 2021-05-17 NOTE — Progress Notes (Signed)
[facility] Hematology/Oncology Consult Note [facility] [phone]  Russell Simpson   DOB:July 28, 1961   PZ#:[id number]   EUM#:[id number]  Subjective: Continues to remain admitted for sepsis secondary to [facility] of left neck s/p biopsy with dermatology. He feels stronger but continues to be fatigued. Had fever overnight. Heart rate continues to be elevated. Eating and drinking well. .    Objective:  Vitals:   05/17/21 0630 05/17/21 1159  BP: 132/82 102/68  Pulse: (!) 110 (!) 106  Resp: 18 18  Temp: 99.5 F (37.5 C) 98.9 F (37.2 C)  SpO2: 93% 92%    Body mass index is 29.53 kg/m.  Intake/Output Summary (Last 24 hours) at 05/17/2021 1515 Last data filed at 05/17/2021 1501 Gross per 24 hour  Intake 636.86 ml  Output 400 ml  Net 236.86 ml    Alert, oriented. No acute distress  Sclerae unicteric  Oropharynx shows no thrush or other lesions  No cervical or supraclavicular adenopathy  Lungs no rales or wheezes--auscultated anterolaterally  Heart regular rate, tachycardia  Abdomen soft, +BS  Neuro nonfocal  Skin: left neck- oval shaped ulceration with serous drainage. Covered with bandaid. No surrounding erythema.   CBG (last 3)  No results for input(s): GLUCAP in the last 72 hours.   Labs:  Lab Results  Component Value Date   WBC 3.9 (L) 05/17/2021   HGB 10.4 (L) 05/17/2021   HCT 27.9 (L) 05/17/2021   MCV 88.6 05/17/2021   PLT 174 05/17/2021   NEUTROABS 2.4 05/17/2021   CMP Latest Ref Rng & Units 05/17/2021 05/16/2021 05/15/2021  Glucose 70 - 99 mg/dL 102(H) 168(H) 125(H)  BUN 6 - 20 mg/dL 8 6 7   Creatinine 0.61 - 1.24 mg/dL 0.89 0.93 0.79  Sodium 135 - 145 mmol/L 127(L) 128(L) 127(L)  Potassium 3.5 - 5.1 mmol/L 3.7 3.8 3.1(L)  Chloride 98 - 111 mmol/L 96(L) 97(L) 94(L)  CO2 22 - 32 mmol/L 25 24 25   Calcium 8.9 - 10.3 mg/dL 7.8(L) 7.8(L) 7.9(L)  Total Protein 6.5 - 8.1 g/dL - - 5.2(L)  Total Bilirubin 0.3 - 1.2 mg/dL - - 1.0  Alkaline Phos 38 - 126 U/L  - - 79  AST 15 - 41 U/L - - 17  ALT 0 - 44 U/L - - 20   Urine Studies No results for input(s): UHGB, CRYS in the last 72 hours.  Invalid input(s): UACOL, UAPR, USPG, UPH, UTP, UGL, UKET, UBIL, UNIT, UROB, ULEU, UEPI, UWBC, URBC, UBAC, CAST, [place], [place]  Basic Metabolic Panel: Recent Labs  Lab 05/14/21 1059 05/14/21 1510 05/15/21 0535 05/16/21 0629 05/17/21 0414  NA 126*  --  127* 128* 127*  K 3.4*  --  3.1* 3.8 3.7  CL 91*  --  94* 97* 96*  CO2 23  --  25 24 25   GLUCOSE 125*  --  125* 168* 102*  BUN 9  --  7 6 8   CREATININE 0.86  --  0.79 0.93 0.89  CALCIUM 8.2*  --  7.9* 7.8* 7.8*  MG  --  1.4* 1.5* 1.7 1.6*  PHOS  --  3.4  --   --   --    GFR Estimated Creatinine Clearance: 116.4 mL/min (by C-G formula based on SCr of 0.89 mg/dL). Liver Function Tests: Recent Labs  Lab 05/14/21 1059 05/15/21 0535  AST 18 17  ALT 24 20  ALKPHOS 95 79  BILITOT 1.4* 1.0  PROT 5.5* 5.2*  ALBUMIN 3.4* 3.0*   No results for  input(s): LIPASE, AMYLASE in the last 168 hours. No results for input(s): AMMONIA in the last 168 hours. Coagulation profile No results for input(s): INR, PROTIME in the last 168 hours.  CBC: Recent Labs  Lab 05/14/21 1059 05/15/21 0535 05/16/21 0629 05/17/21 0414  WBC 1.4* 1.0* 2.6* 3.9*  NEUTROABS 0.8*  --   --  2.4  HGB 11.3* 10.4* 9.9* 10.4*  HCT 31.1* 28.3* 27.5* 27.9*  MCV 88.4 87.1 89.9 88.6  PLT 176 170 163 174   Cardiac Enzymes: No results for input(s): CKTOTAL, CKMB, CKMBINDEX, TROPONINI in the last 168 hours. BNP: Invalid input(s): POCBNP CBG: No results for input(s): GLUCAP in the last 168 hours. D-Dimer No results for input(s): DDIMER in the last 72 hours. Hgb A1c No results for input(s): HGBA1C in the last 72 hours. Lipid Profile No results for input(s): CHOL, HDL, LDLCALC, TRIG, CHOLHDL, LDLDIRECT in the last 72 hours. Thyroid function studies No results for input(s): TSH, T4TOTAL, T3FREE, THYROIDAB in the last 72  hours.  Invalid input(s): FREET3 Anemia work up Recent Labs    05/16/21 0629  FERRITIN 524*   Microbiology Recent Results (from the past 240 hour(s))  Culture, blood (routine x 2)     Status: None (Preliminary result)   Collection Time: 05/14/21 10:59 AM   Specimen: BLOOD  Result Value Ref Range Status   Specimen Description BLOOD BLOOD RIGHT ARM  Final   Special Requests   Final    BOTTLES DRAWN AEROBIC AND ANAEROBIC Blood Culture results may not be optimal due to an excessive volume of blood received in culture bottles   Culture   Final    NO GROWTH 3 DAYS Performed at [facility], [address], [place],  [address]    Report Status PENDING  Incomplete  Resp Panel by RT-PCR (Flu A&B, Covid) Nasopharyngeal Swab     Status: Abnormal   Collection Time: 05/14/21 10:59 AM   Specimen: Nasopharyngeal Swab; Nasopharyngeal(NP) swabs in vial transport medium  Result Value Ref Range Status   SARS Coronavirus 2 by RT PCR POSITIVE (A) NEGATIVE Final    Comment: RESULT CALLED TO, READ BACK BY AND VERIFIED WITH: ROBIN REGISTER [id number] 05/14/2021 JH (NOTE) SARS-CoV-2 target nucleic acids are DETECTED.  The SARS-CoV-2 RNA is generally detectable in upper respiratory specimens during the acute phase of infection. Positive results are indicative of the presence of the identified virus, but do not rule out bacterial infection or co-infection with other pathogens not detected by the test. Clinical correlation with patient history and other diagnostic information is necessary to determine patient infection status. The expected result is Negative.  Fact Sheet for Patients: EntrepreneurPulse.com.au  Fact Sheet for Healthcare Providers: IncredibleEmployment.be  This test is not yet approved or cleared by the [place] FDA and  has been authorized for detection and/or diagnosis of SARS-CoV-2 by FDA under an Emergency Use Authorization  (EUA).  This EUA will remain in effect (meaning this test can b e used) for the duration of  the COVID-19 declaration under Section 564(b)(1) of the Act, 21 U.S.C. section 360bbb-3(b)(1), unless the authorization is terminated or revoked sooner.     Influenza A by PCR NEGATIVE NEGATIVE Final   Influenza B by PCR NEGATIVE NEGATIVE Final    Comment: (NOTE) The Xpert Xpress SARS-CoV-2/FLU/RSV plus assay is intended as an aid in the diagnosis of influenza from Nasopharyngeal swab specimens and should not be used as a sole basis for treatment. Nasal washings and aspirates are unacceptable  for Xpert Xpress SARS-CoV-2/FLU/RSV testing.  Fact Sheet for Patients: EntrepreneurPulse.com.au  Fact Sheet for Healthcare Providers: IncredibleEmployment.be  This test is not yet approved or cleared by the [place] FDA and has been authorized for detection and/or diagnosis of SARS-CoV-2 by FDA under an Emergency Use Authorization (EUA). This EUA will remain in effect (meaning this test can be used) for the duration of the COVID-19 declaration under Section 564(b)(1) of the Act, 21 U.S.C. section 360bbb-3(b)(1), unless the authorization is terminated or revoked.  Performed at [facility], [address] [place], [place] [address]   Culture, blood (routine x 2)     Status: None (Preliminary result)   Collection Time: 05/14/21 11:26 AM   Specimen: BLOOD  Result Value Ref Range Status   Specimen Description BLOOD RIGHT ANTECUBITAL  Final   Special Requests   Final    BOTTLES DRAWN AEROBIC AND ANAEROBIC Blood Culture adequate volume   Culture   Final    NO GROWTH 3 DAYS Performed at [facility], [address], [place], [place] [address]    Report Status PENDING  Incomplete  Aerobic Culture w Gram Stain (superficial specimen)     Status: None (Preliminary result)   Collection Time: 05/15/21  5:42 PM   Specimen: Neck; Wound  Result  Value Ref Range Status   Specimen Description   Final    NECK Performed at [facility], [address] [place], [place] [address]    Special Requests   Final    NONE Performed at [facility], [address] [place], [place] [address]    Gram Stain NO WBC SEEN NO ORGANISMS SEEN   Final   Culture   Final    CULTURE REINCUBATED FOR BETTER GROWTH Performed at [facility], [place] [address] [place], [place] [address]    Report Status PENDING  Incomplete  CULTURE, BLOOD (ROUTINE X 2) w Reflex to ID Panel     Status: None (Preliminary result)   Collection Time: 05/16/21  6:38 AM   Specimen: BLOOD  Result Value Ref Range Status   Specimen Description BLOOD LEFT ANTECUBITAL  Final   Special Requests   Final    BOTTLES DRAWN AEROBIC AND ANAEROBIC Blood Culture results may not be optimal due to an excessive volume of blood received in culture bottles   Culture   Final    NO GROWTH < 24 HOURS Performed at [facility], [address], [place], [place] [address]    Report Status PENDING  Incomplete  CULTURE, BLOOD (ROUTINE X 2) w Reflex to ID Panel     Status: None (Preliminary result)   Collection Time: 05/16/21  6:51 AM   Specimen: BLOOD  Result Value Ref Range Status   Specimen Description BLOOD LEFT WRIST  Final   Special Requests   Final    BOTTLES DRAWN AEROBIC AND ANAEROBIC Blood Culture results may not be optimal due to an excessive volume of blood received in culture bottles   Culture   Final    NO GROWTH < 24 HOURS Performed at [facility], [address] [place], [place] [address]    Report Status PENDING  Incomplete      Studies:  No results found.  Assessment & Plan: 60 y.o. male with stage IV low grade follicular lymphoma currently on rituxan-bendamustine chemotherapy s/p cycle 2. Rituxan d/c with cycle 2 d/t reaction. Currently admitted for infection of left nec wound. Continue prophylaxis: allopurinol, tenofovir,  acyclovir.   Febrile neutropenia- wbc and anc improving. Continue neupogen until [place] of 5. Proceed with neupogen today. Will watch counts tomorrow and then consider discharge if fever resolved. Will plan to recheck counts outpatient 2-3 days after discharge as I suspect counts uptrending.   Wound Infection- left neck wound s/p biopsy confirmed squamous cell carcinoma. Currently on zosyn. ID following.   Covid positivity- persistent positive from may 2022 most likely. S/p Ev[place] given his leukopenia.   Thank you for allowing me to participate in the care of this very pleasant patient.   Verlon Au, NP 05/17/2021  3:40 PM Medical Oncology [facility]

## 2021-05-17 NOTE — Progress Notes (Signed)
   05/17/21 0430  Assess: MEWS Score  Temp 99.8 F (37.7 C)  BP 121/79  Pulse Rate (!) 116  Resp 18  SpO2 91 %  O2 Device Room Air  Assess: MEWS Score  MEWS Temp 0  MEWS Systolic 0  MEWS Pulse 2  MEWS RR 0  MEWS LOC 0  MEWS Score 2  MEWS Score Color Yellow  Assess: if the MEWS score is Yellow or Red  Were vital signs taken at a resting state? Yes  Focused Assessment No change from prior assessment  Early Detection of Sepsis Score *See Row Information* High  MEWS guidelines implemented *See Row Information* No, previously yellow, continue vital signs every 4 hours  Document  Patient Outcome Other (Comment) (Remains the same)

## 2021-05-17 NOTE — Progress Notes (Signed)
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 Pt took bactrim DS initially and then was switched to po cipro after cx results came back outpatient. Dr. Laurence Ferrari (dermatology) was concerned about sepsis so the pt was sent to the ER. Recent wound cx grew pseudomonas putida. Continue on IV zosyn as per ID.   Sepsis: met criteria w/ leukopenia, tachycardia, cellulitis & COVID19. Continue on IV abxs and IV remdesivir.   Neutropenic fever: likely secondary to recent chemo, follicular lymphoma & cellulitis. Still spiking fevers. Continue on granix as per onco. Continue on IV zosyn as per ID  Follicular lymphoma: recently has chemo (bendamustine, retuximab). Continue on home dose of allopurinol, acyclovir, tenofovir. Onco following and recs apprec   COVID19 infection: not hypoxic and no pneumonia. Vaccinated & boostered as per pt. 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 S/p biopsy. Management per dermatology outpatient   Normocytic anemia: likely secondary to recent chemo & lymphoma. No need for a transfusion currently   Hypokalemia: within normal limits today   Hyponatremia: labile, etiology unclear. Will continue to monitor  Hypomagnesemia: mag sulfate given. Will continue to monitor    OSA: does not use CPAP  COPD: w/o exacerbation. Continue on bronchodilators. Continues to smoke cigars daily    BPH: continue on home dose of finasteride   HTN: continue on home dose of BB. Continue to hold home dose of CCB  Alcohol abuse: continue on CIWA protocol. Alcohol cessation counseling   DVT prophylaxis: lovenox  Code Status: full  Family Communication:  Disposition Plan: likely d/c back home   Level of care: Med-Surg   Status is: Inpatient  Remains inpatient appropriate because:IV treatments appropriate due to intensity of illness or inability to take PO and Inpatient level of care appropriate due to severity of illness, still spiking fevers   Dispo: The patient is from: Home              Anticipated d/c is to: Home              Patient currently is not medically stable to d/c.   Difficult to place patient : [place]     Consultants:  Onco ID  Procedures:   Antimicrobials: zosyn    Subjective: Pt c/o fatigue   Objective: Vitals:   05/16/21 2201 05/17/21 0030 05/17/21 0430 05/17/21 0630  BP: 123/78 126/80 121/79 132/82  Pulse: (!) 108 (!) 104 (!) 116 (!) 110  Resp: _0 Temp: 99.5 F (37.5 C) 99.5 F (37.5 C) 99.8 F (37.7 C) 99.5 F (37.5 C)  TempSrc: Oral  Oral   SpO2: 93% 93% 91% 93%  Weight:      Height:        Intake/Output Summary (Last 24 hours) at 05/17/2021 1517 Last data  filed at 05/16/2021 2030 Gross per 24 hour  Intake 138.46 ml  Output 1200 ml  Net -1061.54 ml   Filed Weights   05/14/21 1019  Weight: 104.3 kg    Examination:  General exam: Appears comfortable  Respiratory system: decreased breath sounds b/l  Cardiovascular system: S1 & S2+. No rubs or gallops Gastrointestinal system: Abd is soft, NT, ND & hypoactive bowel sounds  Central nervous system: Alert and oriented. Moves all 4 extremities  Psychiatry: Judgement and insight appear normal. Flat mood and affect    Data Reviewed: I have personally  reviewed following labs and imaging studies  CBC: Recent Labs  Lab 05/14/21 1059 05/15/21 0535 05/16/21 0629 05/17/21 0414  WBC 1.4* 1.0* 2.6* 3.9*  NEUTROABS 0.8*  --   --  2.4  HGB 11.3* 10.4* 9.9* 10.4*  HCT 31.1* 28.3* 27.5* 27.9*  MCV 88.4 87.1 89.9 88.6  PLT 176 170 163 174   Basic Metabolic Panel: Recent Labs  Lab 05/14/21 1059 05/14/21 1510 05/15/21 0535 05/16/21 0629 05/17/21 0414  NA 126*  --  127* 128* 127*  K 3.4*  --  3.1* 3.8 3.7  CL 91*  --  94* 97* 96*  CO2 23  --  25 24 25  GLUCOSE 125*  --  125* 168* 102*  BUN 9  --  7 6 8  CREATININE 0.86  --  0.79 0.93 0.89  CALCIUM 8.2*  --  7.9* 7.8* 7.8*  MG  --  1.4* 1.5* 1.7 1.6*  PHOS  --  3.4  --   --   --    GFR: Estimated Creatinine Clearance: 116.4 mL/min (by C-G formula based on SCr of 0.89 mg/dL). Liver Function Tests: Recent Labs  Lab 05/14/21 1059 05/15/21 0535  AST 18 17  ALT 24 20  ALKPHOS 95 79  BILITOT 1.4* 1.0  PROT 5.5* 5.2*  ALBUMIN 3.4* 3.0*   No results for input(s): LIPASE, AMYLASE in the last 168 hours. No results for input(s): AMMONIA in the last 168 hours. Coagulation Profile: No results for input(s): INR, PROTIME in the last 168 hours. Cardiac Enzymes: No results for input(s): CKTOTAL, CKMB, CKMBINDEX, TROPONINI in the last 168 hours. BNP (last 3 results) No results for input(s): PROBNP in the last 8760 hours. HbA1C: No results for input(s): HGBA1C in the last 72 hours. CBG: No results for input(s): GLUCAP in the last 168 hours. Lipid Profile: No results for input(s): CHOL, HDL, LDLCALC, TRIG, CHOLHDL, LDLDIRECT in the last 72 hours. Thyroid Function Tests: No results for input(s): TSH, T4TOTAL, FREET4, T3FREE, THYROIDAB in the last 72 hours. Anemia Panel: Recent Labs    05/16/21 0629  FERRITIN 524*   Sepsis Labs: Recent Labs  Lab 05/14/21 1059  LATICACIDVEN 0.8    Recent Results (from the past 240 hour(s))  Culture, blood (routine x 2)     Status: None  (Preliminary result)   Collection Time: 05/14/21 10:59 AM   Specimen: BLOOD  Result Value Ref Range Status   Specimen Description BLOOD BLOOD RIGHT ARM  Final   Special Requests   Final    BOTTLES DRAWN AEROBIC AND ANAEROBIC Blood Culture results may not be optimal due to an excessive volume of blood received in culture bottles   Culture   Final    NO GROWTH 3 DAYS Performed at [facility], [address] [place],  [address]    Report Status PENDING  Incomplete  Resp Panel by RT-PCR (Flu A&B, Covid) Nasopharyngeal   Swab     Status: Abnormal   Collection Time: 05/14/21 10:59 AM   Specimen: Nasopharyngeal Swab; Nasopharyngeal(NP) swabs in vial transport medium  Result Value Ref Range Status   SARS Coronavirus 2 by RT PCR POSITIVE (A) NEGATIVE Final    Comment: RESULT CALLED TO, READ BACK BY AND VERIFIED WITH: ROBIN REGISTER [id number] 05/14/2021 JH (NOTE) SARS-CoV-2 target nucleic acids are DETECTED.  The SARS-CoV-2 RNA is generally detectable in upper respiratory specimens during the acute phase of infection. Positive results are indicative of the presence of the identified virus, but do not rule out bacterial infection or co-infection with other pathogens not detected by the test. Clinical correlation with patient history and other diagnostic information is necessary to determine patient infection status. The expected result is Negative.  Fact Sheet for Patients: EntrepreneurPulse.com.au  Fact Sheet for Healthcare Providers: IncredibleEmployment.be  This test is not yet approved or cleared by the [place] FDA and  has been authorized for detection and/or diagnosis of SARS-CoV-2 by FDA under an Emergency Use Authorization (EUA).  This EUA will remain in effect (meaning this test can b e used) for the duration of  the COVID-19 declaration under Section 564(b)(1) of the Act, 21 U.S.C. section 360bbb-3(b)(1), unless the  authorization is terminated or revoked sooner.     Influenza A by PCR NEGATIVE NEGATIVE Final   Influenza B by PCR NEGATIVE NEGATIVE Final    Comment: (NOTE) The Xpert Xpress SARS-CoV-2/FLU/RSV plus assay is intended as an aid in the diagnosis of influenza from Nasopharyngeal swab specimens and should not be used as a sole basis for treatment. Nasal washings and aspirates are unacceptable for Xpert Xpress SARS-CoV-2/FLU/RSV testing.  Fact Sheet for Patients: EntrepreneurPulse.com.au  Fact Sheet for Healthcare Providers: IncredibleEmployment.be  This test is not yet approved or cleared by the [place] FDA and has been authorized for detection and/or diagnosis of SARS-CoV-2 by FDA under an Emergency Use Authorization (EUA). This EUA will remain in effect (meaning this test can be used) for the duration of the COVID-19 declaration under Section 564(b)(1) of the Act, 21 U.S.C. section 360bbb-3(b)(1), unless the authorization is terminated or revoked.  Performed at [facility], [address] [place], [place] [address]   Culture, blood (routine x 2)     Status: None (Preliminary result)   Collection Time: 05/14/21 11:26 AM   Specimen: BLOOD  Result Value Ref Range Status   Specimen Description BLOOD RIGHT ANTECUBITAL  Final   Special Requests   Final    BOTTLES DRAWN AEROBIC AND ANAEROBIC Blood Culture adequate volume   Culture   Final    NO GROWTH 3 DAYS Performed at [facility], [address], [place], [place] [address]    Report Status PENDING  Incomplete  Aerobic Culture w Gram Stain (superficial specimen)     Status: None (Preliminary result)   Collection Time: 05/15/21  5:42 PM   Specimen: Neck; Wound  Result Value Ref Range Status   Specimen Description   Final    NECK Performed at [facility], [address], [place], [place] [address]    Special Requests   Final    NONE Performed at  [facility], [address] [place], [place] [address]    Gram Stain   Final    NO WBC SEEN NO ORGANISMS SEEN Performed at [facility], [place] [address], [place], [place] [address]    Culture PENDING  Incomplete   Report Status PENDING  Incomplete  CULTURE, BLOOD (ROUTINE X 2) w Reflex to ID Panel     Status: None (Preliminary result)   Collection Time: 05/16/21  6:38 AM   Specimen: BLOOD  Result Value Ref Range Status   Specimen Description BLOOD LEFT ANTECUBITAL  Final   Special Requests   Final    BOTTLES DRAWN AEROBIC AND ANAEROBIC Blood Culture results may not be optimal due to an excessive volume of blood received in culture bottles   Culture   Final    NO GROWTH < 24 HOURS Performed at [facility], [address] [place], [place] [address]    Report Status PENDING  Incomplete  CULTURE, BLOOD (ROUTINE X 2) w Reflex to ID Panel     Status: None (Preliminary result)   Collection Time: 05/16/21  6:51 AM   Specimen: BLOOD  Result Value Ref Range Status   Specimen Description BLOOD LEFT WRIST  Final   Special Requests   Final    BOTTLES DRAWN AEROBIC AND ANAEROBIC Blood Culture results may not be optimal due to an excessive volume of blood received in culture bottles   Culture   Final    NO GROWTH < 24 HOURS Performed at [facility], [address], [place], [place] [address]    Report Status PENDING  Incomplete         Radiology Studies: No results found.      Scheduled Meds:  acyclovir  400 mg Oral BID   allopurinol  300 mg Oral Daily   atorvastatin  40 mg Oral Daily   buPROPion  150 mg Oral q morning   clonazePAM  0.5 mg Oral Daily   DULoxetine  60 mg Oral Daily   enoxaparin (LOVENOX) injection  50 mg Subcutaneous Q24H   ferrous sulfate  325 mg Oral Q breakfast   finasteride  5 mg Oral Daily   fluticasone furoate-vilanterol  1 puff Inhalation Daily   folic acid  1 mg Oral Daily   loratadine  10 mg Oral Daily    LORazepam  0-4 mg Oral Q12H   metoprolol succinate  25 mg Oral Daily   mirtazapine  15 mg Oral QHS   montelukast  10 mg Oral QHS   multivitamin with minerals  1 tablet Oral Daily   mupirocin ointment  1 application Topical BID   OLANZapine  7.5 mg Oral QHS   pantoprazole  40 mg Oral Daily   pneumococcal 23 valent vaccine  0.5 mL Intramuscular Tomorrow-1000   sodium chloride flush  3 mL Intravenous Q12H   Tbo-Filgrastim  480 mcg Subcutaneous Daily   Tenofovir Alafenamide Fumarate  25 mg Oral Daily   thiamine  100 mg Oral Daily   Or   thiamine  100 mg Intravenous Daily   Continuous Infusions:  sodium chloride     sodium chloride     magnesium sulfate bolus IVPB     piperacillin-tazobactam (ZOSYN)  IV 3.375 g (05/17/21 0544)   remdesivir 100 mg in NS 100 mL Stopped (05/16/21 1024)     LOS: 3 days    Time spent:33 mins     Wyvonnia Dusky, MD [facility] Pager [phone]  If 7PM-7AM, please contact night-coverage  05/17/2021, 7:42 AM

## 2021-05-18 DIAGNOSIS — L03221 Cellulitis of neck: Secondary | ICD-10-CM | POA: Diagnosis not present

## 2021-05-18 DIAGNOSIS — D709 Neutropenia, unspecified: Secondary | ICD-10-CM | POA: Diagnosis not present

## 2021-05-18 DIAGNOSIS — C8202 Follicular lymphoma grade I, intrathoracic lymph nodes: Secondary | ICD-10-CM | POA: Diagnosis not present

## 2021-05-18 DIAGNOSIS — R5081 Fever presenting with conditions classified elsewhere: Secondary | ICD-10-CM | POA: Diagnosis not present

## 2021-05-18 LAB — AEROBIC CULTURE W GRAM STAIN (SUPERFICIAL SPECIMEN)
Culture: NORMAL
Gram Stain: NONE SEEN

## 2021-05-18 LAB — CBC WITH DIFFERENTIAL/PLATELET
Abs Immature Granulocytes: 0.05 10*3/uL (ref 0.00–0.07)
Basophils Absolute: 0 10*3/uL (ref 0.0–0.1)
Basophils Relative: 1 %
Eosinophils Absolute: 0 10*3/uL (ref 0.0–0.5)
Eosinophils Relative: 0 %
HCT: 29.6 % — ABNORMAL LOW (ref 39.0–52.0)
Hemoglobin: 10.8 g/dL — ABNORMAL LOW (ref 13.0–17.0)
Immature Granulocytes: 1 %
Lymphocytes Relative: 21 %
Lymphs Abs: 0.9 10*3/uL (ref 0.7–4.0)
MCH: 32.1 pg (ref 26.0–34.0)
MCHC: 36.5 g/dL — ABNORMAL HIGH (ref 30.0–36.0)
MCV: 88.1 fL (ref 80.0–100.0)
Monocytes Absolute: 0.9 10*3/uL (ref 0.1–1.0)
Monocytes Relative: 20 %
Neutro Abs: 2.4 10*3/uL (ref 1.7–7.7)
Neutrophils Relative %: 57 %
Platelets: 182 10*3/uL (ref 150–400)
RBC: 3.36 MIL/uL — ABNORMAL LOW (ref 4.22–5.81)
RDW: 14.6 % (ref 11.5–15.5)
Smear Review: NORMAL
WBC: 4.2 10*3/uL (ref 4.0–10.5)
nRBC: 0 % (ref 0.0–0.2)

## 2021-05-18 LAB — BASIC METABOLIC PANEL
Anion gap: 6 (ref 5–15)
BUN: 8 mg/dL (ref 6–20)
CO2: 25 mmol/L (ref 22–32)
Calcium: 8.1 mg/dL — ABNORMAL LOW (ref 8.9–10.3)
Chloride: 101 mmol/L (ref 98–111)
Creatinine, Ser: 0.83 mg/dL (ref 0.61–1.24)
GFR, Estimated: >60 mL/min (ref >60)
Glucose, Bld: 102 mg/dL — ABNORMAL HIGH (ref 70–99)
Potassium: 3.9 mmol/L (ref 3.5–5.1)
Sodium: 132 mmol/L — ABNORMAL LOW (ref 135–145)

## 2021-05-18 LAB — MAGNESIUM: Magnesium: 1.8 mg/dL (ref 1.7–2.4)

## 2021-05-18 NOTE — Progress Notes (Signed)
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 Pt took bactrim DS initially and then was switched to po cipro after cx results came back outpatient. Dr. Laurence Ferrari (dermatology) was concerned about sepsis so the pt was sent to the ER. Recent wound cx grew pseudomonas putida. Continue on IV zosyn as per ID.   Sepsis: met criteria w/ leukopenia, tachycardia, cellulitis & COVID19. Continue on IV abxs. Completed remdesivir course   Neutropenic fever: likely secondary to recent chemo, lymphoma & cellulitis. Still spiking fevers but fever curve is trending down. Continue on granix as per onco. Continue on IV zosyn  Follicular lymphoma: recently has chemo (bendamustine, retuximab). Continue on home dose of allopurinol, acyclovir, tenofovir. Onco following and recs apprec   COVID19 infection: not hypoxic and no pneumonia. Vaccinated & boostered as per pt. Completed remdesivir course. 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 S/p biopsy. Management per dermatology outpatient   Normocytic anemia: likely secondary to recent chemo & lymphoma. No need for a transfusion currently   Hypokalemia:  WNL today   Hyponatremia: labile and improved w/ IVFs   Hypomagnesemia: WNL today   OSA:  does not use CPAP  COPD: w/o exacerbation. Continue on bronchodilators. Continues to smoke cigars daily    BPH: continue on home dose of finasteride   HTN: continue on home dose of metoprolol. Continue to hold home dose amlodipine   Alcohol abuse: alcohol cessation counseling   DVT prophylaxis: lovenox  Code Status: full  Family Communication:  Disposition Plan: likely d/c back home   Level of care: Med-Surg   Status is: Inpatient  Remains inpatient appropriate because:IV treatments appropriate due to intensity of illness or inability to take PO and Inpatient level of care appropriate due to severity of illness, still spiking fevers and needs to be afebrile for at least 24 hours   Dispo: The patient is from: Home              Anticipated d/c is to: Home              Patient currently is not medically stable to d/c.   Difficult to place patient : unclear     Consultants:  Onco ID  Procedures:   Antimicrobials: zosyn    Subjective: Pt c/o malaise   Objective: Vitals:   05/17/21 1542 05/17/21 2128 05/18/21 0015 05/18/21 0518  BP: 122/79 126/81 129/82 115/77  Pulse: (!) 102 (!) 104 (!) 102 (!) 111  Resp: $Remo'18 20 20 20  'hGfln$ Temp: (!) 100.7 F (38.2 C) 99 F (37.2 C) 99 F (37.2 C) 98.7 F (37.1 C)  TempSrc:  Oral    SpO2: 96% 97% 97% 94%  Weight:      Height:  Intake/Output Summary (Last 24 hours) at 05/18/2021 0754 Last data filed at 05/18/2021 0518 Gross per 24 hour  Intake 774.96 ml  Output 900 ml  Net -125.04 ml   Filed Weights   05/14/21 1019  Weight: 104.3 kg    Examination:  General exam: Appears calm & comfortable  Respiratory system: diminished breath sounds b/l Cardiovascular system: S1/S2+. No rubs or clicks  Gastrointestinal system:  Abd is soft, NT, ND & hypoactive bowel sounds  Central nervous system: Alert and oriented. Moves all extremities  Psychiatry: judgement and insight appear normal. Flat mood and affect     Data  Reviewed: I have personally reviewed following labs and imaging studies  CBC: Recent Labs  Lab 05/14/21 1059 05/15/21 0535 05/16/21 0629 05/17/21 0414 05/18/21 0459  WBC 1.4* 1.0* 2.6* 3.9* 4.2  NEUTROABS 0.8*  --   --  2.4 2.4  HGB 11.3* 10.4* 9.9* 10.4* 10.8*  HCT 31.1* 28.3* 27.5* 27.9* 29.6*  MCV 88.4 87.1 89.9 88.6 88.1  PLT 176 170 163 174 923   Basic Metabolic Panel: Recent Labs  Lab 05/14/21 1059 05/14/21 1510 05/15/21 0535 05/16/21 0629 05/17/21 0414 05/18/21 0459  NA 126*  --  127* 128* 127* 132*  K 3.4*  --  3.1* 3.8 3.7 3.9  CL 91*  --  94* 97* 96* 101  CO2 23  --  $R'25 24 25 25  'AN$ GLUCOSE 125*  --  125* 168* 102* 102*  BUN 9  --  $R'7 6 8 8  'IQ$ CREATININE 0.86  --  0.79 0.93 0.89 0.83  CALCIUM 8.2*  --  7.9* 7.8* 7.8* 8.1*  MG  --  1.4* 1.5* 1.7 1.6* 1.8  PHOS  --  3.4  --   --   --   --    GFR: Estimated Creatinine Clearance: 124.9 mL/min (by C-G formula based on SCr of 0.83 mg/dL). Liver Function Tests: Recent Labs  Lab 05/14/21 1059 05/15/21 0535  AST 18 17  ALT 24 20  ALKPHOS 95 79  BILITOT 1.4* 1.0  PROT 5.5* 5.2*  ALBUMIN 3.4* 3.0*   No results for input(s): LIPASE, AMYLASE in the last 168 hours. No results for input(s): AMMONIA in the last 168 hours. Coagulation Profile: No results for input(s): INR, PROTIME in the last 168 hours. Cardiac Enzymes: No results for input(s): CKTOTAL, CKMB, CKMBINDEX, TROPONINI in the last 168 hours. BNP (last 3 results) No results for input(s): PROBNP in the last 8760 hours. HbA1C: No results for input(s): HGBA1C in the last 72 hours. CBG: No results for input(s): GLUCAP in the last 168 hours. Lipid Profile: No results for input(s): CHOL, HDL, LDLCALC, TRIG, CHOLHDL, LDLDIRECT in the last 72 hours. Thyroid Function Tests: No results for input(s): TSH, T4TOTAL, FREET4, T3FREE, THYROIDAB in the last 72 hours. Anemia Panel: Recent Labs    05/16/21 0629  RAQTMAUQ 333*   Sepsis Labs: Recent Labs  Lab  05/14/21 1059  LATICACIDVEN 0.8    Recent Results (from the past 240 hour(s))  Culture, blood (routine x 2)     Status: None (Preliminary result)   Collection Time: 05/14/21 10:59 AM   Specimen: BLOOD  Result Value Ref Range Status   Specimen Description BLOOD BLOOD RIGHT ARM  Final   Special Requests   Final    BOTTLES DRAWN AEROBIC AND ANAEROBIC Blood Culture results may not be optimal due to an excessive volume of blood received in culture bottles   Culture   Final  NO GROWTH 4 DAYS Performed at [facility], [address] [place], [place] [address]    Report Status PENDING  Incomplete  Resp Panel by RT-PCR (Flu A&B, Covid) Nasopharyngeal Swab     Status: Abnormal   Collection Time: 05/14/21 10:59 AM   Specimen: Nasopharyngeal Swab; Nasopharyngeal(NP) swabs in vial transport medium  Result Value Ref Range Status   SARS Coronavirus 2 by RT PCR POSITIVE (A) NEGATIVE Final    Comment: RESULT CALLED TO, READ BACK BY AND VERIFIED WITH: ROBIN REGISTER [id number] 05/14/2021 JH (NOTE) SARS-CoV-2 target nucleic acids are DETECTED.  The SARS-CoV-2 RNA is generally detectable in upper respiratory specimens during the acute phase of infection. Positive results are indicative of the presence of the identified virus, but do not rule out bacterial infection or co-infection with other pathogens not detected by the test. Clinical correlation with patient history and other diagnostic information is necessary to determine patient infection status. The expected result is Negative.  Fact Sheet for Patients: EntrepreneurPulse.com.au  Fact Sheet for Healthcare Providers: IncredibleEmployment.be  This test is not yet approved or cleared by the [place] FDA and  has been authorized for detection and/or diagnosis of SARS-CoV-2 by FDA under an Emergency Use Authorization (EUA).  This EUA will remain in effect (meaning this test can b e used) for  the duration of  the COVID-19 declaration under Section 564(b)(1) of the Act, 21 U.S.C. section 360bbb-3(b)(1), unless the authorization is terminated or revoked sooner.     Influenza A by PCR NEGATIVE NEGATIVE Final   Influenza B by PCR NEGATIVE NEGATIVE Final    Comment: (NOTE) The Xpert Xpress SARS-CoV-2/FLU/RSV plus assay is intended as an aid in the diagnosis of influenza from Nasopharyngeal swab specimens and should not be used as a sole basis for treatment. Nasal washings and aspirates are unacceptable for Xpert Xpress SARS-CoV-2/FLU/RSV testing.  Fact Sheet for Patients: EntrepreneurPulse.com.au  Fact Sheet for Healthcare Providers: IncredibleEmployment.be  This test is not yet approved or cleared by the [place] FDA and has been authorized for detection and/or diagnosis of SARS-CoV-2 by FDA under an Emergency Use Authorization (EUA). This EUA will remain in effect (meaning this test can be used) for the duration of the COVID-19 declaration under Section 564(b)(1) of the Act, 21 U.S.C. section 360bbb-3(b)(1), unless the authorization is terminated or revoked.  Performed at [facility], [address] [place], [place] [address]   Culture, blood (routine x 2)     Status: None (Preliminary result)   Collection Time: 05/14/21 11:26 AM   Specimen: BLOOD  Result Value Ref Range Status   Specimen Description BLOOD RIGHT ANTECUBITAL  Final   Special Requests   Final    BOTTLES DRAWN AEROBIC AND ANAEROBIC Blood Culture adequate volume   Culture   Final    NO GROWTH 4 DAYS Performed at [facility], [address], [place], [place] [address]    Report Status PENDING  Incomplete  Aerobic Culture w Gram Stain (superficial specimen)     Status: None (Preliminary result)   Collection Time: 05/15/21  5:42 PM   Specimen: Neck; Wound  Result Value Ref Range Status   Specimen Description   Final    NECK Performed at  [facility], [address], [place],  [address]    Special Requests   Final    NONE Performed at [facility], [place], [place] [address]    Gram Stain NO WBC SEEN NO  ORGANISMS SEEN   Final   Culture   Final    CULTURE REINCUBATED FOR BETTER GROWTH Performed at [facility], [place] [address] [place], [place] [address]    Report Status PENDING  Incomplete  CULTURE, BLOOD (ROUTINE X 2) w Reflex to ID Panel     Status: None (Preliminary result)   Collection Time: 05/16/21  6:38 AM   Specimen: BLOOD  Result Value Ref Range Status   Specimen Description BLOOD LEFT ANTECUBITAL  Final   Special Requests   Final    BOTTLES DRAWN AEROBIC AND ANAEROBIC Blood Culture results may not be optimal due to an excessive volume of blood received in culture bottles   Culture   Final    NO GROWTH 2 DAYS Performed at [facility], [address] [place], [place] [address]    Report Status PENDING  Incomplete  CULTURE, BLOOD (ROUTINE X 2) w Reflex to ID Panel     Status: None (Preliminary result)   Collection Time: 05/16/21  6:51 AM   Specimen: BLOOD  Result Value Ref Range Status   Specimen Description BLOOD LEFT WRIST  Final   Special Requests   Final    BOTTLES DRAWN AEROBIC AND ANAEROBIC Blood Culture results may not be optimal due to an excessive volume of blood received in culture bottles   Culture   Final    NO GROWTH 2 DAYS Performed at [facility], [address], [place], [place] [address]    Report Status PENDING  Incomplete         Radiology Studies: No results found.      Scheduled Meds:  acyclovir  400 mg Oral BID   allopurinol  300 mg Oral Daily   atorvastatin  40 mg Oral Daily   buPROPion  150 mg Oral q morning   clonazePAM  0.5 mg Oral Daily   DULoxetine  60 mg Oral Daily   enoxaparin (LOVENOX) injection  50 mg Subcutaneous Q24H   ferrous sulfate  325 mg Oral Q breakfast   finasteride  5 mg  Oral Daily   fluticasone furoate-vilanterol  1 puff Inhalation Daily   folic acid  1 mg Oral Daily   loratadine  10 mg Oral Daily   LORazepam  0-4 mg Oral Q12H   metoprolol succinate  25 mg Oral Daily   mirtazapine  15 mg Oral QHS   montelukast  10 mg Oral QHS   multivitamin with minerals  1 tablet Oral Daily   mupirocin ointment  1 application Topical BID   OLANZapine  7.5 mg Oral QHS   pantoprazole  40 mg Oral Daily   pneumococcal 23 valent vaccine  0.5 mL Intramuscular Tomorrow-1000   sodium chloride flush  3 mL Intravenous Q12H   Tbo-Filgrastim  480 mcg Subcutaneous Daily   Tenofovir Alafenamide Fumarate  25 mg Oral Daily   thiamine  100 mg Oral Daily   Or   thiamine  100 mg Intravenous Daily   Continuous Infusions:  sodium chloride     sodium chloride Stopped (05/17/21 2148)   piperacillin-tazobactam (ZOSYN)  IV 3.375 g (05/18/21 0515)   remdesivir 100 mg in NS 100 mL Stopped (05/17/21 1218)     LOS: 4 days    Time spent: 30 mins     Wyvonnia Dusky, MD [facility] Pager [phone]  If 7PM-7AM, please contact night-coverage  05/18/2021, 7:54 AM

## 2021-05-18 NOTE — Progress Notes (Signed)
   05/18/21 0518  Assess: MEWS Score  Temp 98.7 F (37.1 C)  BP 115/77  Pulse Rate (!) 111  Resp 20  SpO2 94 %  O2 Device Room Air  Assess: MEWS Score  MEWS Temp 0  MEWS Systolic 0  MEWS Pulse 2  MEWS RR 0  MEWS LOC 0  MEWS Score 2  MEWS Score Color Yellow  Assess: if the MEWS score is Yellow or Red  Were vital signs taken at a resting state? Yes  Focused Assessment No change from prior assessment  Early Detection of Sepsis Score *See Row Information* High  MEWS guidelines implemented *See Row Information* No, previously yellow, continue vital signs every 4 hours  Treat  Pain Scale 0-10  Pain Score 0  Document  Patient Outcome Other (Comment) (Remains stable)  Progress note created (see row info) Yes

## 2021-05-19 DIAGNOSIS — L03221 Cellulitis of neck: Secondary | ICD-10-CM | POA: Diagnosis not present

## 2021-05-19 DIAGNOSIS — C8202 Follicular lymphoma grade I, intrathoracic lymph nodes: Secondary | ICD-10-CM | POA: Diagnosis not present

## 2021-05-19 DIAGNOSIS — R5081 Fever presenting with conditions classified elsewhere: Secondary | ICD-10-CM | POA: Diagnosis not present

## 2021-05-19 DIAGNOSIS — D709 Neutropenia, unspecified: Secondary | ICD-10-CM | POA: Diagnosis not present

## 2021-05-19 LAB — CBC WITH DIFFERENTIAL/PLATELET
Abs Immature Granulocytes: 0.32 10*3/uL — ABNORMAL HIGH (ref 0.00–0.07)
Basophils Absolute: 0.1 10*3/uL (ref 0.0–0.1)
Basophils Relative: 1 %
Eosinophils Absolute: 0 10*3/uL (ref 0.0–0.5)
Eosinophils Relative: 0 %
HCT: 29.3 % — ABNORMAL LOW (ref 39.0–52.0)
Hemoglobin: 10.6 g/dL — ABNORMAL LOW (ref 13.0–17.0)
Immature Granulocytes: 5 %
Lymphocytes Relative: 18 %
Lymphs Abs: 1.1 10*3/uL (ref 0.7–4.0)
MCH: 32 pg (ref 26.0–34.0)
MCHC: 36.2 g/dL — ABNORMAL HIGH (ref 30.0–36.0)
MCV: 88.5 fL (ref 80.0–100.0)
Monocytes Absolute: 1.5 10*3/uL — ABNORMAL HIGH (ref 0.1–1.0)
Monocytes Relative: 24 %
Neutro Abs: 3.3 10*3/uL (ref 1.7–7.7)
Neutrophils Relative %: 52 %
Platelets: 176 10*3/uL (ref 150–400)
RBC: 3.31 MIL/uL — ABNORMAL LOW (ref 4.22–5.81)
RDW: 15.1 % (ref 11.5–15.5)
Smear Review: NORMAL
WBC: 6.3 10*3/uL (ref 4.0–10.5)
nRBC: 0 % (ref 0.0–0.2)

## 2021-05-19 LAB — BASIC METABOLIC PANEL
Anion gap: 6 (ref 5–15)
BUN: 8 mg/dL (ref 6–20)
CO2: 27 mmol/L (ref 22–32)
Calcium: 8.1 mg/dL — ABNORMAL LOW (ref 8.9–10.3)
Chloride: 100 mmol/L (ref 98–111)
Creatinine, Ser: 0.9 mg/dL (ref 0.61–1.24)
GFR, Estimated: >60 mL/min (ref >60)
Glucose, Bld: 119 mg/dL — ABNORMAL HIGH (ref 70–99)
Potassium: 4 mmol/L (ref 3.5–5.1)
Sodium: 133 mmol/L — ABNORMAL LOW (ref 135–145)

## 2021-05-19 LAB — CULTURE, BLOOD (ROUTINE X 2)
Culture: NO GROWTH
Culture: NO GROWTH
Special Requests: ADEQUATE

## 2021-05-19 LAB — MAGNESIUM: Magnesium: 1.5 mg/dL — ABNORMAL LOW (ref 1.7–2.4)

## 2021-05-19 MED ORDER — LORAZEPAM 0.5 MG PO TABS
0.5000 mg | ORAL_TABLET | Freq: Every evening | ORAL | Status: DC | PRN
  Administered 2021-05-19: 0.5 mg via ORAL
  Filled 2021-05-19: qty 1

## 2021-05-19 MED ORDER — ASPIRIN EC 81 MG PO TBEC
81.0000 mg | DELAYED_RELEASE_TABLET | Freq: Every day | ORAL | Status: DC
  Administered 2021-05-19 – 2021-05-20 (×2): 81 mg via ORAL
  Filled 2021-05-19 (×2): qty 1

## 2021-05-19 MED ORDER — MAGNESIUM SULFATE 2 GM/50ML IV SOLN
2.0000 g | Freq: Once | INTRAVENOUS | Status: AC
  Administered 2021-05-19: 2 g via INTRAVENOUS
  Filled 2021-05-19: qty 50

## 2021-05-19 NOTE — Progress Notes (Signed)
Date of Admission:  05/14/2021      ID: Russell Simpson is a 60 y.o. 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)   Stage 2 moderate COPD by GOLD classification (HCC)   Cellulitis   Cellulitis, neck   COVID-19 virus infection   Alcohol use disorder, severe, dependence (HCC)   Chemotherapy induced neutropenia (HCC)   Squamous cell carcinoma of neck    Subjective:  Doing well Low grade fever No cough Wants to go home   Medications:   acyclovir  400 mg Oral BID   allopurinol  300 mg Oral Daily   aspirin EC  81 mg Oral Daily   atorvastatin  40 mg Oral Daily   buPROPion  150 mg Oral q morning   clonazePAM  0.5 mg Oral Daily   DULoxetine  60 mg Oral Daily   enoxaparin (LOVENOX) injection  50 mg Subcutaneous Q24H   ferrous sulfate  325 mg Oral Q breakfast   finasteride  5 mg Oral Daily   fluticasone furoate-vilanterol  1 puff Inhalation Daily   folic acid  1 mg Oral Daily   loratadine  10 mg Oral Daily   metoprolol succinate  25 mg Oral Daily   mirtazapine  15 mg Oral QHS   montelukast  10 mg Oral QHS   multivitamin with minerals  1 tablet Oral Daily   mupirocin ointment  1 application Topical BID   OLANZapine  7.5 mg Oral QHS   pantoprazole  40 mg Oral Daily   pneumococcal 23 valent vaccine  0.5 mL Intramuscular Tomorrow-1000   sodium chloride flush  3 mL Intravenous Q12H   Tenofovir Alafenamide Fumarate  25 mg Oral Daily   thiamine  100 mg Oral Daily   Or   thiamine  100 mg Intravenous Daily    Objective: Vital signs in last 24 hours: Temp:  [98.1 F (36.7 C)-100.5 F (38.1 C)] 98.1 F (36.7 C) (06/20 1608) Pulse Rate:  [99-109] 101 (06/20 1608) Resp:  [17-20] 18 (06/20 1608) BP: (127-140)/(78-92) 137/90 (06/20 1608) SpO2:  [94 %-99 %] 99 % (06/20  1608)  PHYSICAL EXAM:  General: Alert, cooperative, no distress, appears stated age.  Head: Normocephalic, without obvious abnormality, atraumatic. Eyes: Conjunctivae clear, anicteric sclerae. Pupils are equal ENT Nares normal. No drainage or sinus tenderness. Lips, mucosa, and tongue normal. No Thrush Neck: Supple, symmetrical, no adenopathy, thyroid: non tender no carotid bruit and no JVD. Back: No CVA tenderness. Lungs: Clear to auscultation bilaterally. No Wheezing or Rhonchi. No rales. Heart: Regular rate and rhythm, no murmur, rub or gallop. Abdomen: Soft, non-tender,not distended. Bowel sounds normal. No masses Extremities: atraumatic, no cyanosis. No edema. No clubbing Skin: No rashes or lesions. Or bruising Lymph: Cervical, supraclavicular normal. Neurologic: Grossly non-focal PORt in place Left manidular/neck wound clean and no surrounding erythema  Lab Results Recent Labs    05/18/21 0459 05/19/21 0651  WBC 4.2 6.3  HGB 10.8* 10.6*  HCT 29.6* 29.3*  NA 132* 133*  K 3.9 4.0  CL 101 100  CO2 25 27  BUN 8 8  CREATININE 0.83 0.90   Microbiology:  Studies/Results: No results found.   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 Now no resp symptoms. Ct value is 28 indicating fairly replicating  virus- getting Remdisvir.   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 So that is not an allergy and should be removed from the allergy list   Discussed the management with patient. ID will sign off- call if needed

## 2021-05-19 NOTE — Care Management Important Message (Signed)
Important Message  Patient Details  Name: Russell Simpson MRN: [id number] Date of Birth: 1961-03-08   Medicare Important Message Given:  Yes - Important Message mailed due to current National Emergency  Reviewed Medicare IM with patient via room phone due to isolation status.  Copy of Medicare IM placed in mail to home address on file.     Dannette Barbara 05/19/2021, 3:30 PM

## 2021-05-19 NOTE — Progress Notes (Signed)
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 Pt took bactrim DS initially and then was switched to po cipro after cx results came back outpatient. Dr. Laurence Ferrari (dermatology) was concerned about sepsis so the pt was sent to the ER. Recent wound cx grew pseudomonas putida. Continue on IV zosyn as per ID.   Sepsis: met criteria w/ leukopenia, tachycardia, cellulitis & COVID19. Continue on IV abxs. Completed remdesivir course. Resolved   Neutropenic fever: likely secondary to recent chemo, lymphoma & cellulitis. Continue on IV zosyn as per ID. Still spiking fevers and needs to be afebrile for 24 hours to d/c pt. Continue on granix as per onco   Follicular lymphoma: recently has chemo (bendamustine, retuximab). Continue on home dose of allopurinol, acyclovir, tenofovir. Onco following and recs apprec   COVID19 infection: not hypoxic and no pneumonia. Vaccinated & boostered as per pt. Completed remdesivir course. 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 S/p biopsy. Management per dermatology outpatient   Normocytic anemia: likely secondary to recent chemo & lymphoma. H&H are stable   Hypokalemia: within normal limits today   Hyponatremia: trending up today. Will continue to  monitor   Hypomagnesemia: mag sulfate given.   OSA: does not use CPAP  COPD: w/o exacerbation. Continue on bronchodilators. Smokes cigars daily   BPH: continue on home dose of finasteride   HTN: continue on home dose of BB. Continue to hold CCB   Alcohol abuse: alcohol cessation counseling   DVT prophylaxis: lovenox  Code Status: full  Family Communication:  Disposition Plan: likely d/c back home   Level of care: Med-Surg   Status is: Inpatient  Remains inpatient appropriate because:IV treatments appropriate due to intensity of illness or inability to take PO and Inpatient level of care appropriate due to severity of illness, still spiking fevers and needs to be afebrile for at least 24 hours   Dispo: The patient is from: Home              Anticipated d/c is to: Home              Patient currently is not medically stable to d/c.   Difficult to place patient : unclear     Consultants:  Onco ID  Procedures:   Antimicrobials: zosyn    Subjective: Pt c/o not sleeping well   Objective: Vitals:   05/18/21 1542 05/18/21 2136 05/18/21 2200 05/19/21 0443  BP: 120/82 140/88 (!) 139/92 127/82  Pulse: 97 (!) 109 99 (!) 108  Resp: $Remo'16 20 18 18  'dsOqZ$ Temp: 99.5 F (37.5 C) (!) 100.5 F (38.1 C) 98.7 F (37.1 C) 100.2 F (37.9 C)  TempSrc: Oral     SpO2: 92% 94% 94% 96%  Weight:      Height:  Intake/Output Summary (Last 24 hours) at 05/19/2021 0740 Last data filed at 05/18/2021 2130 Gross per 24 hour  Intake --  Output 500 ml  Net -500 ml   Filed Weights   05/14/21 1019  Weight: 104.3 kg    Examination:  General exam: Appears frustrated Respiratory system: decreased breath sounds b/l. No rales Cardiovascular system: S1 & S2+. No rubs or clicks  Gastrointestinal system:  Abd is soft, NT, ND & normal bowel sounds  Central nervous system: Alert and oriented. Moves all extremities  Psychiatry: judgement and insight appear normal. Flat mood and  affect    Data Reviewed: I have personally reviewed following labs and imaging studies  CBC: Recent Labs  Lab 05/14/21 1059 05/15/21 0535 05/16/21 0629 05/17/21 0414 05/18/21 0459 05/19/21 0651  WBC 1.4* 1.0* 2.6* 3.9* 4.2 6.3  NEUTROABS 0.8*  --   --  2.4 2.4 PENDING  HGB 11.3* 10.4* 9.9* 10.4* 10.8* 10.6*  HCT 31.1* 28.3* 27.5* 27.9* 29.6* 29.3*  MCV 88.4 87.1 89.9 88.6 88.1 88.5  PLT 176 170 163 174 182 686   Basic Metabolic Panel: Recent Labs  Lab 05/14/21 1510 05/15/21 0535 05/16/21 0629 05/17/21 0414 05/18/21 0459 05/19/21 0651  NA  --  127* 128* 127* 132* 133*  K  --  3.1* 3.8 3.7 3.9 4.0  CL  --  94* 97* 96* 101 100  CO2  --  $R'25 24 25 25 27  'Pw$ GLUCOSE  --  125* 168* 102* 102* 119*  BUN  --  $R'7 6 8 8 8  'Is$ CREATININE  --  0.79 0.93 0.89 0.83 0.90  CALCIUM  --  7.9* 7.8* 7.8* 8.1* 8.1*  MG 1.4* 1.5* 1.7 1.6* 1.8 1.5*  PHOS 3.4  --   --   --   --   --    GFR: Estimated Creatinine Clearance: 115.2 mL/min (by C-G formula based on SCr of 0.9 mg/dL). Liver Function Tests: Recent Labs  Lab 05/14/21 1059 05/15/21 0535  AST 18 17  ALT 24 20  ALKPHOS 95 79  BILITOT 1.4* 1.0  PROT 5.5* 5.2*  ALBUMIN 3.4* 3.0*   No results for input(s): LIPASE, AMYLASE in the last 168 hours. No results for input(s): AMMONIA in the last 168 hours. Coagulation Profile: No results for input(s): INR, PROTIME in the last 168 hours. Cardiac Enzymes: No results for input(s): CKTOTAL, CKMB, CKMBINDEX, TROPONINI in the last 168 hours. BNP (last 3 results) No results for input(s): PROBNP in the last 8760 hours. HbA1C: No results for input(s): HGBA1C in the last 72 hours. CBG: No results for input(s): GLUCAP in the last 168 hours. Lipid Profile: No results for input(s): CHOL, HDL, LDLCALC, TRIG, CHOLHDL, LDLDIRECT in the last 72 hours. Thyroid Function Tests: No results for input(s): TSH, T4TOTAL, FREET4, T3FREE, THYROIDAB in the last 72 hours. Anemia Panel: No results for input(s):  VITAMINB12, FOLATE, FERRITIN, TIBC, IRON, RETICCTPCT in the last 72 hours.  Sepsis Labs: Recent Labs  Lab 05/14/21 1059  LATICACIDVEN 0.8    Recent Results (from the past 240 hour(s))  Culture, blood (routine x 2)     Status: None (Preliminary result)   Collection Time: 05/14/21 10:59 AM   Specimen: BLOOD  Result Value Ref Range Status   Specimen Description BLOOD BLOOD RIGHT ARM  Final   Special Requests   Final    BOTTLES DRAWN AEROBIC AND ANAEROBIC Blood Culture results may not be optimal due to an excessive volume of blood received in culture bottles  Culture   Final    NO GROWTH 4 DAYS Performed at [facility], [address] [place], [place] [address]    Report Status PENDING  Incomplete  Resp Panel by RT-PCR (Flu A&B, Covid) Nasopharyngeal Swab     Status: Abnormal   Collection Time: 05/14/21 10:59 AM   Specimen: Nasopharyngeal Swab; Nasopharyngeal(NP) swabs in vial transport medium  Result Value Ref Range Status   SARS Coronavirus 2 by RT PCR POSITIVE (A) NEGATIVE Final    Comment: RESULT CALLED TO, READ BACK BY AND VERIFIED WITH: ROBIN REGISTER [id number] 05/14/2021 JH (NOTE) SARS-CoV-2 target nucleic acids are DETECTED.  The SARS-CoV-2 RNA is generally detectable in upper respiratory specimens during the acute phase of infection. Positive results are indicative of the presence of the identified virus, but do not rule out bacterial infection or co-infection with other pathogens not detected by the test. Clinical correlation with patient history and other diagnostic information is necessary to determine patient infection status. The expected result is Negative.  Fact Sheet for Patients: EntrepreneurPulse.com.au  Fact Sheet for Healthcare Providers: IncredibleEmployment.be  This test is not yet approved or cleared by the [place] FDA and  has been authorized for detection and/or diagnosis of SARS-CoV-2 by FDA under  an Emergency Use Authorization (EUA).  This EUA will remain in effect (meaning this test can b e used) for the duration of  the COVID-19 declaration under Section 564(b)(1) of the Act, 21 U.S.C. section 360bbb-3(b)(1), unless the authorization is terminated or revoked sooner.     Influenza A by PCR NEGATIVE NEGATIVE Final   Influenza B by PCR NEGATIVE NEGATIVE Final    Comment: (NOTE) The Xpert Xpress SARS-CoV-2/FLU/RSV plus assay is intended as an aid in the diagnosis of influenza from Nasopharyngeal swab specimens and should not be used as a sole basis for treatment. Nasal washings and aspirates are unacceptable for Xpert Xpress SARS-CoV-2/FLU/RSV testing.  Fact Sheet for Patients: EntrepreneurPulse.com.au  Fact Sheet for Healthcare Providers: IncredibleEmployment.be  This test is not yet approved or cleared by the [place] FDA and has been authorized for detection and/or diagnosis of SARS-CoV-2 by FDA under an Emergency Use Authorization (EUA). This EUA will remain in effect (meaning this test can be used) for the duration of the COVID-19 declaration under Section 564(b)(1) of the Act, 21 U.S.C. section 360bbb-3(b)(1), unless the authorization is terminated or revoked.  Performed at [facility], [address] [place], [place] [address]   Culture, blood (routine x 2)     Status: None (Preliminary result)   Collection Time: 05/14/21 11:26 AM   Specimen: BLOOD  Result Value Ref Range Status   Specimen Description BLOOD RIGHT ANTECUBITAL  Final   Special Requests   Final    BOTTLES DRAWN AEROBIC AND ANAEROBIC Blood Culture adequate volume   Culture   Final    NO GROWTH 4 DAYS Performed at [facility], [address], [place], [place] [address]    Report Status PENDING  Incomplete  Aerobic Culture w Gram Stain (superficial specimen)     Status: None   Collection Time: 05/15/21  5:42 PM   Specimen: Neck; Wound   Result Value Ref Range Status   Specimen Description   Final    NECK Performed at [facility], [address] [place], [place] [address]    Special Requests   Final    NONE Performed at [facility], [address] [place], [place] [address]    Gram  Stain NO WBC SEEN NO ORGANISMS SEEN   Final   Culture   Final    RARE NORMAL SKIN FLORA NO GROUP A STREP (S.PYOGENES) ISOLATED NO STAPHYLOCOCCUS AUREUS ISOLATED Performed at [facility], [address] [place], [place] [address]    Report Status 05/18/2021 FINAL  Final  CULTURE, BLOOD (ROUTINE X 2) w Reflex to ID Panel     Status: None (Preliminary result)   Collection Time: 05/16/21  6:38 AM   Specimen: BLOOD  Result Value Ref Range Status   Specimen Description BLOOD LEFT ANTECUBITAL  Final   Special Requests   Final    BOTTLES DRAWN AEROBIC AND ANAEROBIC Blood Culture results may not be optimal due to an excessive volume of blood received in culture bottles   Culture   Final    NO GROWTH 2 DAYS Performed at [facility], [address], [place], [place] [address]    Report Status PENDING  Incomplete  CULTURE, BLOOD (ROUTINE X 2) w Reflex to ID Panel     Status: None (Preliminary result)   Collection Time: 05/16/21  6:51 AM   Specimen: BLOOD  Result Value Ref Range Status   Specimen Description BLOOD LEFT WRIST  Final   Special Requests   Final    BOTTLES DRAWN AEROBIC AND ANAEROBIC Blood Culture results may not be optimal due to an excessive volume of blood received in culture bottles   Culture   Final    NO GROWTH 2 DAYS Performed at [facility], [address] [place], [place] [address]    Report Status PENDING  Incomplete         Radiology Studies: No results found.      Scheduled Meds:  acyclovir  400 mg Oral BID   allopurinol  300 mg Oral Daily   atorvastatin  40 mg Oral Daily   buPROPion  150 mg Oral q morning   clonazePAM  0.5 mg Oral Daily    DULoxetine  60 mg Oral Daily   enoxaparin (LOVENOX) injection  50 mg Subcutaneous Q24H   ferrous sulfate  325 mg Oral Q breakfast   finasteride  5 mg Oral Daily   fluticasone furoate-vilanterol  1 puff Inhalation Daily   folic acid  1 mg Oral Daily   loratadine  10 mg Oral Daily   metoprolol succinate  25 mg Oral Daily   mirtazapine  15 mg Oral QHS   montelukast  10 mg Oral QHS   multivitamin with minerals  1 tablet Oral Daily   mupirocin ointment  1 application Topical BID   OLANZapine  7.5 mg Oral QHS   pantoprazole  40 mg Oral Daily   pneumococcal 23 valent vaccine  0.5 mL Intramuscular Tomorrow-1000   sodium chloride flush  3 mL Intravenous Q12H   Tbo-Filgrastim  480 mcg Subcutaneous Daily   Tenofovir Alafenamide Fumarate  25 mg Oral Daily   thiamine  100 mg Oral Daily   Or   thiamine  100 mg Intravenous Daily   Continuous Infusions:  sodium chloride     piperacillin-tazobactam (ZOSYN)  IV 3.375 g (05/19/21 0503)     LOS: 5 days    Time spent: 25 mins     Wyvonnia Dusky, MD [facility] Pager [phone]  If 7PM-7AM, please contact night-coverage  05/19/2021, 7:40 AM

## 2021-05-19 NOTE — TOC Initial Note (Signed)
Transition of Care [facility] - Initial/Assessment Note    Patient Details  Name: Russell Simpson MRN: [id number] Date of Birth: Jun 20, 1961  Transition of Care [facility]) CM/SW Contact:    Candie Chroman, LCSW Phone Number: 05/19/2021, 10:20 AM  Clinical Narrative:  Patient on COVID isolation precautions. CSW called patient in the room, introduced role, and explained inquired about interest in resources for alcohol use treatment. Patient talked about a medication he said he needs to help him sleep which he has been prescribed here. He declined the resources because he said he is not an alcoholic. Encouraged him to notify CSW if he changed his mind. No further concerns. CSW will continue to follow patient for support and facilitate return home when stable.                Expected Discharge Plan: Home/Self Care Barriers to Discharge: Continued Medical Work up   Patient Goals and CMS Choice        Expected Discharge Plan and Services Expected Discharge Plan: Home/Self Care     Post Acute Care Choice: NA Living arrangements for the past 2 months: Single Family Home                                      Prior Living Arrangements/Services Living arrangements for the past 2 months: Single Family Home   Patient language and need for interpreter reviewed:: Yes Do you feel safe going back to the place where you live?: Yes      Need for Family Participation in Patient Care: Yes (Comment)     Criminal Activity/Legal Involvement Pertinent to Current Situation/Hospitalization: No - Comment as needed  Activities of Daily Living Home Assistive Devices/Equipment: None ADL Screening (condition at time of admission) Patient's cognitive ability adequate to safely complete daily activities?: Yes Is the patient deaf or have difficulty hearing?: No Does the patient have difficulty seeing, even when wearing glasses/contacts?: No Does the patient have difficulty concentrating, remembering, or making  decisions?: No Patient able to express need for assistance with ADLs?: Yes Does the patient have difficulty dressing or bathing?: No Independently performs ADLs?: Yes (appropriate for developmental age) Does the patient have difficulty walking or climbing stairs?: No Weakness of Legs: None Weakness of Arms/Hands: None  Permission Sought/Granted                  Emotional Assessment Appearance:: Appears stated age Attitude/Demeanor/Rapport: Engaged Affect (typically observed): Appropriate, Calm, Pleasant Orientation: : Oriented to Self, Oriented to Place, Oriented to  Time, Oriented to Situation Alcohol / Substance Use: Alcohol Use Psych Involvement: No (comment)  Admission diagnosis:  Cellulitis of neck [L03.221] Cellulitis, neck [L03.221] Squamous cell carcinoma of neck [C44.42] Failure of outpatient treatment [Z78.9] Stage 2 moderate COPD by GOLD classification ([place]) [J44.9] Sepsis due to Pseudomonas species, unspecified whether acute organ dysfunction present [facility]) [A41.52] Patient Active Problem List   Diagnosis Date Noted   Squamous cell carcinoma of neck    Chemotherapy induced neutropenia ([place])    Stage 2 moderate COPD by GOLD classification ([place]) 05/14/2021   Cellulitis 05/14/2021   Cellulitis, neck 05/14/2021   COVID-19 virus infection 05/14/2021   Alcohol use disorder, severe, dependence ([place]) 34/19/6222   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 ([place]) 04/03/2021   Aortic atherosclerosis ([place]) 02/27/2021   High risk medication use 03/21/2020   No-show for appointment 03/18/2020   Noncompliance with  treatment regimen 02/19/2020   Erectile dysfunction due to arterial insufficiency 08/10/2019   Mixed bipolar I disorder ([place]) 07/19/2019   PTSD (post-traumatic stress disorder) 07/19/2019   Panic disorder 07/19/2019   Tobacco use disorder 07/19/2019   Moderate episode of recurrent major depressive disorder ([place]) 05/25/2019   S/P lumbar fusion  02/22/2019   Alcohol use disorder, severe, in early remission ([place]) 01/01/2019   Disorder of bursae of shoulder region 07/21/2017   Full thickness rotator cuff tear 07/21/2017   Localized, primary osteoarthritis 07/21/2017   Osteoarthritis of elbow 07/21/2017   Osteoarthritis of knee 07/21/2017   Anxiety 01/10/2016   BP (high blood pressure) 01/10/2016   Apnea, sleep 01/10/2016   Hyperlipidemia 10/15/2015   Pituitary adenoma ([place]) 10/15/2015   Hypogonadism in male 10/15/2015   Depression 10/15/2015   Impotence of organic origin 10/15/2015   Allergic rhinitis 10/15/2015   Incomplete bladder emptying 05/30/2015   Hernia, inguinal, left 09/21/2014   Benign prostatic hyperplasia with lower urinary tract symptoms 10/24/2012   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 [place] [address] Phone: [phone] Fax: [phone]  [facility] [id number], [place] [address] [place] [address] Phone: [phone] Fax: [phone]  [facility] - [place], [place] [address] Phone: [phone] Fax: [phone]  [place] [address] Phone: [phone] Fax: [phone]     Social Determinants of Health (SDOH) Interventions    Readmission Risk Interventions No flowsheet data found.

## 2021-05-20 ENCOUNTER — Inpatient Hospital Stay: Payer: Medicare HMO

## 2021-05-20 DIAGNOSIS — L03221 Cellulitis of neck: Secondary | ICD-10-CM | POA: Diagnosis not present

## 2021-05-20 DIAGNOSIS — U071 COVID-19: Secondary | ICD-10-CM | POA: Diagnosis not present

## 2021-05-20 DIAGNOSIS — C8202 Follicular lymphoma grade I, intrathoracic lymph nodes: Secondary | ICD-10-CM | POA: Diagnosis not present

## 2021-05-20 LAB — CBC WITH DIFFERENTIAL/PLATELET
Abs Immature Granulocytes: 0.37 10*3/uL — ABNORMAL HIGH (ref 0.00–0.07)
Basophils Absolute: 0.1 10*3/uL (ref 0.0–0.1)
Basophils Relative: 1 %
Eosinophils Absolute: 0 10*3/uL (ref 0.0–0.5)
Eosinophils Relative: 0 %
HCT: 30.4 % — ABNORMAL LOW (ref 39.0–52.0)
Hemoglobin: 10.6 g/dL — ABNORMAL LOW (ref 13.0–17.0)
Immature Granulocytes: 6 %
Lymphocytes Relative: 25 %
Lymphs Abs: 1.6 10*3/uL (ref 0.7–4.0)
MCH: 31.5 pg (ref 26.0–34.0)
MCHC: 34.9 g/dL (ref 30.0–36.0)
MCV: 90.5 fL (ref 80.0–100.0)
Monocytes Absolute: 1.6 10*3/uL — ABNORMAL HIGH (ref 0.1–1.0)
Monocytes Relative: 25 %
Neutro Abs: 2.6 10*3/uL (ref 1.7–7.7)
Neutrophils Relative %: 43 %
Platelets: 185 10*3/uL (ref 150–400)
RBC: 3.36 MIL/uL — ABNORMAL LOW (ref 4.22–5.81)
RDW: 14.9 % (ref 11.5–15.5)
Smear Review: NORMAL
WBC: 6.2 10*3/uL (ref 4.0–10.5)
nRBC: 0 % (ref 0.0–0.2)

## 2021-05-20 LAB — BASIC METABOLIC PANEL
Anion gap: 7 (ref 5–15)
BUN: 7 mg/dL (ref 6–20)
CO2: 27 mmol/L (ref 22–32)
Calcium: 8.3 mg/dL — ABNORMAL LOW (ref 8.9–10.3)
Chloride: 99 mmol/L (ref 98–111)
Creatinine, Ser: 0.87 mg/dL (ref 0.61–1.24)
GFR, Estimated: >60 mL/min (ref >60)
Glucose, Bld: 94 mg/dL (ref 70–99)
Potassium: 3.9 mmol/L (ref 3.5–5.1)
Sodium: 133 mmol/L — ABNORMAL LOW (ref 135–145)

## 2021-05-20 LAB — MAGNESIUM: Magnesium: 1.9 mg/dL (ref 1.7–2.4)

## 2021-05-20 MED ORDER — LEVOFLOXACIN 750 MG PO TABS: 750.0000 mg | ORAL_TABLET | Freq: Every day | ORAL | 0 refills | Status: AC

## 2021-05-20 NOTE — TOC Transition Note (Signed)
Transition of Care [facility]) - CM/SW Discharge Note   Patient Details  Name: Saim Almanza MRN: [id number] Date of Birth: 13-Jun-1961  [facility]) CM/SW Contact:  Candie Chroman, LCSW Phone Number: 05/20/2021, 11:23 AM   Clinical Narrative:  Patient has orders to discharge home today. No further concerns. CSW signing off.   Final next level of care: Home/Self Care Barriers to Discharge: Barriers Resolved   Patient Goals and CMS Choice        Discharge Placement                    Patient and family notified of of transfer: 05/20/21  Discharge Plan and Services     Post Acute Care Choice: NA                               Social Determinants of Health (SDOH) Interventions     Readmission Risk Interventions No flowsheet data found.

## 2021-05-20 NOTE — Progress Notes (Signed)
Patient discharged home via wheelchair.  No acute distress noted.  Patient discharged with all pertinent information, prescriptions, and personal belongings.  IV d/ced.  Care relinquished.

## 2021-05-20 NOTE — Discharge Summary (Signed)
Physician Discharge Summary  Russell Simpson XYB:[id number] DOB: 11-06-1961 DOA: 05/14/2021  PCP: Charlynne Cousins, MD  Admit date: 05/14/2021 Discharge date: 05/20/2021  Admitted From: home  Disposition:  home  Recommendations for Outpatient Follow-up:  Follow up with PCP in 1-2 weeks F/u w/ onco in 1-2 weeks F/u w/ derm in 1-2 days  Home Health: no  Equipment/Devices:  Discharge Condition:stable  CODE STATUS: full  Diet recommendation: Heart Healthy   Brief/Interim Summary: HPI was taken from Dr. Linda Hedges: Russell Simpson is a 60 y.o. 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   I discussed the case and received additional history from his dermatologist, Dr. Laurence Ferrari.  She notes that the lesion on the left neck is biopsy-proven squamous cell carcinoma.  Additionally, he started having worsening erythema, swelling, pain and drainage from the area.  They cultured and found it to be infected with Pseudomonas Putida species ss to Cipro, Levqauin, cefipime and mixed anaerobes.  He was treated with Bactrim and then changed to ciprofloxacin when culture data was available, but has continued to worsen.   Patient reports worsening fatigue, loss of appetite, chills over the past 2 days.  Symptoms are constant, waxing and waning without aggravating or alleviating factors.  No difficulty swallowing or breathing.  No cough, no headache or neck stiffness.  No limitation in neck range of motion. Due to persistent symptoms he presents to [facility] ED for evaluation as directerd.    ED Course: T 98.6  130/92  HR 103  RR 16. Na 126, nl Cr, T Protein low at 5.5, T. Bili low at 1.4. WBC 1.4 with nl diff. Due to leukopenia, tachycardia and symptoms code sepsis initiated: patient bolused with 1 liter  NS, Vanc and Levaquin given IV. Due to positive covid 19 test he was started by EDP on remdesivir but had no respiratory symptoms or hypoxemia.  TRH called to admit for continue IV abx and covid treatment.   Hospital course from Dr. Jimmye Norman 6/16-6/21/22: Pt was found to have left sided neck cellulitis and was on IV zosyn as per ID. IV zosyn was switched to po levaquin at d/c for 5 days more to complete the course as per ID. Of note, pt was found to have neutropenic fever and was started on granix as per onco and IV abxs. Pt's WBC was WNL prior to d/c. Furthermore, pt was found to have COVID19 positive but no pneumonia was found on imaging. Pt did complete a course of remdesivir while inpatient. For more information, please see previous progress/consult notes.   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 Pt took bactrim DS initially and then was switched to po cipro after cx results came back outpatient. Dr. Laurence Ferrari (dermatology) was  concerned about sepsis so the pt was sent to the ER. Recent wound cx grew pseudomonas putida. Continue on IV zosyn while inpatient and switch to po levaquin at d/c for 5 days more as per ID    Sepsis: met criteria w/ leukopenia, tachycardia, cellulitis & COVID19. Continue on IV abxs. Completed remdesivir course. Resolved    Neutropenic fever: likely secondary to recent chemo, lymphoma & cellulitis. Continue on abxs. Afebrile  x 24 hours. Resolved     Follicular lymphoma: recently has chemo (bendamustine, retuximab). Continue on home dose of allopurinol, acyclovir, tenofovir. Onco following and recs apprec   COVID19 infection: not hypoxic and no pneumonia. Vaccinated & boostered as per pt. Completed remdesivir course. 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 S/p biopsy. Management per dermatology outpatient   Normocytic anemia: likely secondary to recent chemo & lymphoma. H&H are stable    Hypokalemia: WNL    Hyponatremia: stable    Hypomagnesemia: WNL    OSA: does not use CPAP   COPD: w/o exacerbation. Continue on bronchodilators. Smokes cigars daily    BPH: continue on home dose of finasteride    HTN: continue on home dose of BB & CCB   Alcohol abuse: alcohol cessation counseling  Discharge Instructions  Discharge Instructions     Diet - low sodium heart healthy   Complete by: As directed    Discharge instructions   Complete by: As directed    F/u w/ PCP in 1-2 weeks. F/u w/ onco in 1-2 weeks. F/u w/ dermatology in 1-2 days   Increase activity slowly   Complete by: As directed    No wound care   Complete by: As directed       Allergies as of 05/20/2021       Reactions   Penicillins Anaphylaxis   Tolerates cefdinir, cephalexin and amoxicillin/clavulonate so true PCN allergy unlikely   Tetanus Toxoids Swelling   Lisinopril Cough   Losartan    Other reaction(s): Muscle Pain   Tetanus Toxoid    Codeine Nausea Only, Nausea And Vomiting   Doxycycline Rash   Ruxience [rituximab-pvvr] Rash   05/06/21 pt w/ worsening rash during Ruxience infusion.  Face, neck and chest flushing redness        Medication List     STOP taking these medications    Breztri Aerosphere 160-9-4.8 MCG/ACT Aero Generic drug: Budeson-Glycopyrrol-Formoterol   ciprofloxacin 250 MG tablet Commonly known as: CIPRO    sulfamethoxazole-trimethoprim 800-160 MG tablet Commonly known as: BACTRIM DS       TAKE these medications    acyclovir 400 MG tablet Commonly known as: ZOVIRAX Take 1 tablet (400 mg total) by mouth 2 (two) times daily.   allopurinol 300 MG tablet Commonly known as: ZYLOPRIM Take 1 tablet (300 mg total) by mouth daily.   amLODipine 5 MG tablet Commonly known as: NORVASC Take 1 tablet (5 mg total) by mouth daily.   aspirin EC 81 MG tablet Take 81 mg by mouth daily. Swallow whole.   atorvastatin 40 MG tablet Commonly known as: LIPITOR Take 1 tablet (40 mg total) by mouth daily.   benzonatate 200 MG capsule Commonly known as: TESSALON Take 1 capsule (200 mg total) by mouth 3 (three) times daily as needed for cough.   Breo Ellipta 100-25 MCG/INH Aepb Generic drug: fluticasone furoate-vilanterol Inhale 1 puff into the lungs daily.   buPROPion 150 MG 24 hr tablet Commonly known as: WELLBUTRIN  XL Take 1 tablet by mouth every morning.   clonazePAM 0.5 MG tablet Commonly known as: KLONOPIN Take 0.5 mg by mouth daily.   dexamethasone 4 MG tablet Commonly known as: DECADRON Take 2 tablets (8 mg total) by mouth daily. Start the day after bendamustine chemotherapy for 2 days. Take with food.   DULoxetine 60 MG capsule Commonly known as: CYMBALTA Take 1 capsule (60 mg total) by mouth daily.   finasteride 5 MG tablet Commonly known as: PROSCAR TAKE 1 TABLET BY MOUTH ONCE DAILY   HYDROcodone bit-homatropine 5-1.5 MG/5ML syrup Commonly known as: Hycodan Take 5 mLs by mouth every 4 (four) hours as needed for cough.   Iron 142 (45 Fe) MG Tbcr Take 1 tablet by mouth as needed.   lansoprazole 30 MG capsule Commonly known as: PREVACID TAKE 1 CAPSULE BY MOUTH ONCE DAILY AT NOON   levofloxacin 750 MG tablet Commonly known as: Levaquin Take 1 tablet (750 mg total) by mouth daily for 5 days.   lidocaine-prilocaine cream Commonly known as: EMLA Apply to affected area  once   loratadine 10 MG tablet Commonly known as: CLARITIN Take 10 mg by mouth daily.   metoprolol succinate 25 MG 24 hr tablet Commonly known as: TOPROL-XL Take 1 tablet (25 mg total) by mouth daily.   mirtazapine 15 MG tablet Commonly known as: REMERON Take 15 mg by mouth at bedtime.   modafinil 200 MG tablet Commonly known as: PROVIGIL Take 200 mg by mouth daily.   montelukast 10 MG tablet Commonly known as: SINGULAIR Take 1 tablet (10 mg total) by mouth at bedtime.   mupirocin ointment 2 % Commonly known as: BACTROBAN Apply 1 application topically 2 (two) times daily.   naltrexone 50 MG tablet Commonly known as: DEPADE   OLANZapine 7.5 MG tablet Commonly known as: ZYPREXA Take 7.5 mg by mouth at bedtime.   ondansetron 8 MG tablet Commonly known as: Zofran Take 1 tablet (8 mg total) by mouth 2 (two) times daily as needed for refractory nausea / vomiting. Start on day 2 after bendamustine chemo.   prochlorperazine 10 MG tablet Commonly known as: COMPAZINE TAKE 1 TABLET BY MOUTH EVERY 6 HOURS AS NEEDED NAUSEA AND VOMITING   tacrolimus 0.1 % ointment Commonly known as: PROTOPIC Apply 1 application topically 2 (two) times daily.   tadalafil 20 MG tablet Commonly known as: CIALIS 1 tab 1 hour prior to intercourse   tenofovir 300 MG tablet Commonly known as: VIREAD Take 1 tablet (300 mg total) by mouth daily.   testosterone cypionate 200 MG/ML injection Commonly known as: DEPOTESTOSTERONE CYPIONATE Inject 1 mL (200 mg total) into the muscle every 14 (fourteen) days.   triamcinolone cream 0.1 % Commonly known as: KENALOG Apply to affected areas 1-2 times a day until rash improved. Avoid face, groin, underarms.        Allergies  Allergen Reactions   Penicillins Anaphylaxis    Tolerates cefdinir, cephalexin and amoxicillin/clavulonate so true PCN allergy unlikely   Tetanus Toxoids Swelling   Lisinopril Cough   Losartan      Other reaction(s): Muscle  Pain     Tetanus Toxoid    Codeine Nausea Only and Nausea And Vomiting   Doxycycline Rash   Ruxience [Rituximab-Pvvr] Rash    05/06/21 pt w/ worsening rash during Ruxience infusion.  Face, neck and chest flushing redness    Consultations: Onco ID   Procedures/Studies: DG Chest Portable 1 View  Result Date: 05/14/2021 CLINICAL DATA:  Weakness and  tachycardia.  Lymphoma EXAM: PORTABLE CHEST 1 VIEW COMPARISON:  01/06/2014 FINDINGS: Cardiac and mediastinal contours normal. No adenopathy or mass. Lungs well aerated and clear Right jugular Port-A-Cath in place. No priors for comparison for catheter positioning. The catheter loops in the right internal jugular vein with the tip in the region of the right innominate vein. IMPRESSION: No acute abnormality. Right jugular Port-A-Cath tip in the region of the right innominate vein. Electronically Signed   By: Franchot Gallo M.D.   On: 05/14/2021 11:33      Subjective: Pt c/o fatigue    Discharge Exam: Vitals:   05/20/21 0543 05/20/21 0853  BP: 124/83 135/87  Pulse: 100 (!) 110  Resp: 16 17  Temp: 98.6 F (37 C) 98.9 F (37.2 C)  SpO2: 94% 95%   Vitals:   05/19/21 1608 05/19/21 2012 05/20/21 0543 05/20/21 0853  BP: 137/90 (!) 137/95 124/83 135/87  Pulse: (!) 101 97 100 (!) 110  Resp: _0 Temp: 98.1 F (36.7 C) 98.2 F (36.8 C) 98.6 F (37 C) 98.9 F (37.2 C)  TempSrc: Oral Oral Oral Oral  SpO2: 99% 96% 94% 95%  Weight:      Height:        General: Pt is alert, awake, not in acute distress Cardiovascular: S1/S2 +, no rubs, no gallops Respiratory: CTA bilaterally, no wheezing, no rhonchi Abdominal: Soft, NT, ND, bowel sounds + Extremities: no edema, no cyanosis    The results of significant diagnostics from this hospitalization (including imaging, microbiology, ancillary and laboratory) are listed below for reference.     Microbiology: Recent Results (from the past 240 hour(s))  Culture, blood (routine x 2)      Status: None   Collection Time: 05/14/21 10:59 AM   Specimen: BLOOD  Result Value Ref Range Status   Specimen Description BLOOD BLOOD RIGHT ARM  Final   Special Requests   Final    BOTTLES DRAWN AEROBIC AND ANAEROBIC Blood Culture results may not be optimal due to an excessive volume of blood received in culture bottles   Culture   Final    NO GROWTH 5 DAYS Performed at [facility], [address] [place],  [address]    Report Status 05/19/2021 FINAL  Final  Resp Panel by RT-PCR (Flu A&B, Covid) Nasopharyngeal Swab     Status: Abnormal   Collection Time: 05/14/21 10:59 AM   Specimen: Nasopharyngeal Swab; Nasopharyngeal(NP) swabs in vial transport medium  Result Value Ref Range Status   SARS Coronavirus 2 by RT PCR POSITIVE (A) NEGATIVE Final    Comment: RESULT CALLED TO, READ BACK BY AND VERIFIED WITH: ROBIN REGISTER [id number] 05/14/2021 [facility] (NOTE) SARS-CoV-2 target nucleic acids are DETECTED.  The SARS-CoV-2 RNA is generally detectable in upper respiratory specimens during the acute phase of infection. Positive results are indicative of the presence of the identified virus, but do not rule out bacterial infection or co-infection with other pathogens not detected by the test. Clinical correlation with patient history and other diagnostic information is necessary to determine patient infection status. The expected result is Negative.  Fact Sheet for Patients: EntrepreneurPulse.com.au  Fact Sheet for Healthcare Providers: IncredibleEmployment.be  This test is not yet approved or cleared by the [place] FDA and  has been authorized for detection and/or diagnosis of SARS-CoV-2 by FDA under an Emergency Use Authorization (EUA).  This EUA will remain in effect (meaning this test can b e used) for the duration of  the  COVID-19 declaration under Section 564(b)(1) of the Act, 21 U.S.C. section 360bbb-3(b)(1), unless the  authorization is terminated or revoked sooner.     Influenza A by PCR NEGATIVE NEGATIVE Final   Influenza B by PCR NEGATIVE NEGATIVE Final    Comment: (NOTE) The Xpert Xpress SARS-CoV-2/FLU/RSV plus assay is intended as an aid in the diagnosis of influenza from Nasopharyngeal swab specimens and should not be used as a sole basis for treatment. Nasal washings and aspirates are unacceptable for Xpert Xpress SARS-CoV-2/FLU/RSV testing.  Fact Sheet for Patients: EntrepreneurPulse.com.au  Fact Sheet for Healthcare Providers: IncredibleEmployment.be  This test is not yet approved or cleared by the [place] FDA and has been authorized for detection and/or diagnosis of SARS-CoV-2 by FDA under an Emergency Use Authorization (EUA). This EUA will remain in effect (meaning this test can be used) for the duration of the COVID-19 declaration under Section 564(b)(1) of the Act, 21 U.S.C. section 360bbb-3(b)(1), unless the authorization is terminated or revoked.  Performed at [facility], [address] [place], [place] [address]   Culture, blood (routine x 2)     Status: None   Collection Time: 05/14/21 11:26 AM   Specimen: BLOOD  Result Value Ref Range Status   Specimen Description BLOOD RIGHT ANTECUBITAL  Final   Special Requests   Final    BOTTLES DRAWN AEROBIC AND ANAEROBIC Blood Culture adequate volume   Culture   Final    NO GROWTH 5 DAYS Performed at [facility], [address], [place], [place] [address]    Report Status 05/19/2021 FINAL  Final  Aerobic Culture w Gram Stain (superficial specimen)     Status: None   Collection Time: 05/15/21  5:42 PM   Specimen: Neck; Wound  Result Value Ref Range Status   Specimen Description   Final    NECK Performed at [facility], [address], [place], [place] [address]    Special Requests   Final    NONE Performed at [facility], [address] [place], [place] [address]    Gram Stain NO WBC SEEN NO ORGANISMS SEEN   Final   Culture   Final    RARE NORMAL SKIN FLORA NO GROUP A STREP (S.PYOGENES) ISOLATED NO STAPHYLOCOCCUS AUREUS ISOLATED Performed at [facility], [address], [place], [place] [address]    Report Status 05/18/2021 FINAL  Final  CULTURE, BLOOD (ROUTINE X 2) w Reflex to ID Panel     Status: None (Preliminary result)   Collection Time: 05/16/21  6:38 AM   Specimen: BLOOD  Result Value Ref Range Status   Specimen Description BLOOD LEFT ANTECUBITAL  Final   Special Requests   Final    BOTTLES DRAWN AEROBIC AND ANAEROBIC Blood Culture results may not be optimal due to an excessive volume of blood received in culture bottles   Culture   Final    NO GROWTH 4 DAYS Performed at [facility], [address] [place], [place] [address]    Report Status PENDING  Incomplete  CULTURE, BLOOD (ROUTINE X 2) w Reflex to ID Panel     Status: None (Preliminary result)   Collection Time: 05/16/21  6:51 AM   Specimen: BLOOD  Result Value Ref Range Status   Specimen Description BLOOD LEFT WRIST  Final   Special Requests   Final    BOTTLES DRAWN AEROBIC AND ANAEROBIC Blood Culture results may not be optimal due to an excessive volume of  blood received in culture bottles   Culture   Final    NO GROWTH 4 DAYS Performed at [facility], [address] [place], [place] [address]    Report Status PENDING  Incomplete     Labs: BNP (last 3 results) Recent Labs    05/14/21 1059  BNP 69.6   Basic Metabolic Panel: Recent Labs  Lab 05/14/21 1510 05/15/21 0535 05/16/21 0629 05/17/21 0414 05/18/21 0459 05/19/21 0651 05/20/21 0431  NA  --    < > 128* 127* 132* 133* 133*  K  --    < > 3.8 3.7 3.9 4.0 3.9  CL  --    < > 97* 96* 101 100 99  CO2  --    < > _0 GLUCOSE  --    < > 168* 102* 102* 119* 94  BUN  --    < > _1 CREATININE  --    < > 0.93 0.89 0.83 0.90 0.87   CALCIUM  --    < > 7.8* 7.8* 8.1* 8.1* 8.3*  MG 1.4*   < > 1.7 1.6* 1.8 1.5* 1.9  PHOS 3.4  --   --   --   --   --   --    < > = values in this interval not displayed.   Liver Function Tests: Recent Labs  Lab 05/14/21 1059 05/15/21 0535  AST 18 17  ALT 24 20  ALKPHOS 95 79  BILITOT 1.4* 1.0  PROT 5.5* 5.2*  ALBUMIN 3.4* 3.0*   No results for input(s): LIPASE, AMYLASE in the last 168 hours. No results for input(s): AMMONIA in the last 168 hours. CBC: Recent Labs  Lab 05/14/21 1059 05/15/21 0535 05/16/21 0629 05/17/21 0414 05/18/21 0459 05/19/21 0651 05/20/21 0431  WBC 1.4*   < > 2.6* 3.9* 4.2 6.3 6.2  NEUTROABS 0.8*  --   --  2.4 2.4 3.3 2.6  HGB 11.3*   < > 9.9* 10.4* 10.8* 10.6* 10.6*  HCT 31.1*   < > 27.5* 27.9* 29.6* 29.3* 30.4*  MCV 88.4   < > 89.9 88.6 88.1 88.5 90.5  PLT 176   < > 163 174 182 176 185   < > = values in this interval not displayed.   Cardiac Enzymes: No results for input(s): CKTOTAL, CKMB, CKMBINDEX, TROPONINI in the last 168 hours. BNP: Invalid input(s): POCBNP CBG: No results for input(s): GLUCAP in the last 168 hours. D-Dimer No results for input(s): DDIMER in the last 72 hours. Hgb A1c No results for input(s): HGBA1C in the last 72 hours. Lipid Profile No results for input(s): CHOL, HDL, LDLCALC, TRIG, CHOLHDL, LDLDIRECT in the last 72 hours. Thyroid function studies No results for input(s): TSH, T4TOTAL, T3FREE, THYROIDAB in the last 72 hours.  Invalid input(s): FREET3 Anemia work up No results for input(s): VITAMINB12, FOLATE, FERRITIN, TIBC, IRON, RETICCTPCT in the last 72 hours. Urinalysis    Component Value Date/Time   COLORURINE YELLOW (A) 05/14/2021 1059   APPEARANCEUR CLEAR (A) 05/14/2021 1059   APPEARANCEUR Clear 07/19/2013 0635   LABSPEC 1.003 (L) 05/14/2021 1059   LABSPEC 1.017 07/19/2013 0635   PHURINE 6.0 05/14/2021 1059   GLUCOSEU NEGATIVE 05/14/2021 1059   GLUCOSEU Negative 07/19/2013 0635   HGBUR NEGATIVE  05/14/2021 1059   BILIRUBINUR NEGATIVE 05/14/2021 1059   BILIRUBINUR Negative 10/02/2019 1818   BILIRUBINUR Negative 07/19/2013 0635   KETONESUR 5 (A) 05/14/2021 1059  PROTEINUR NEGATIVE 05/14/2021 1059   UROBILINOGEN 0.2 10/02/2019 1818   NITRITE NEGATIVE 05/14/2021 1059   LEUKOCYTESUR NEGATIVE 05/14/2021 1059   LEUKOCYTESUR Negative 07/19/2013 0635   Sepsis Labs Invalid input(s): PROCALCITONIN,  WBC,  LACTICIDVEN Microbiology Recent Results (from the past 240 hour(s))  Culture, blood (routine x 2)     Status: None   Collection Time: 05/14/21 10:59 AM   Specimen: BLOOD  Result Value Ref Range Status   Specimen Description BLOOD BLOOD RIGHT ARM  Final   Special Requests   Final    BOTTLES DRAWN AEROBIC AND ANAEROBIC Blood Culture results may not be optimal due to an excessive volume of blood received in culture bottles   Culture   Final    NO GROWTH 5 DAYS Performed at [facility], [address] [place], [place] [address]    Report Status 05/19/2021 FINAL  Final  Resp Panel by RT-PCR (Flu A&B, Covid) Nasopharyngeal Swab     Status: Abnormal   Collection Time: 05/14/21 10:59 AM   Specimen: Nasopharyngeal Swab; Nasopharyngeal(NP) swabs in vial transport medium  Result Value Ref Range Status   SARS Coronavirus 2 by RT PCR POSITIVE (A) NEGATIVE Final    Comment: RESULT CALLED TO, READ BACK BY AND VERIFIED WITH: ROBIN REGISTER [id number] 05/14/2021 [facility] (NOTE) SARS-CoV-2 target nucleic acids are DETECTED.  The SARS-CoV-2 RNA is generally detectable in upper respiratory specimens during the acute phase of infection. Positive results are indicative of the presence of the identified virus, but do not rule out bacterial infection or co-infection with other pathogens not detected by the test. Clinical correlation with patient history and other diagnostic information is necessary to determine patient infection status. The expected result is Negative.  Fact Sheet for  Patients: EntrepreneurPulse.com.au  Fact Sheet for Healthcare Providers: IncredibleEmployment.be  This test is not yet approved or cleared by the [place] FDA and  has been authorized for detection and/or diagnosis of SARS-CoV-2 by FDA under an Emergency Use Authorization (EUA).  This EUA will remain in effect (meaning this test can b e used) for the duration of  the COVID-19 declaration under Section 564(b)(1) of the Act, 21 U.S.C. section 360bbb-3(b)(1), unless the authorization is terminated or revoked sooner.     Influenza A by PCR NEGATIVE NEGATIVE Final   Influenza B by PCR NEGATIVE NEGATIVE Final    Comment: (NOTE) The Xpert Xpress SARS-CoV-2/FLU/RSV plus assay is intended as an aid in the diagnosis of influenza from Nasopharyngeal swab specimens and should not be used as a sole basis for treatment. Nasal washings and aspirates are unacceptable for Xpert Xpress SARS-CoV-2/FLU/RSV testing.  Fact Sheet for Patients: EntrepreneurPulse.com.au  Fact Sheet for Healthcare Providers: IncredibleEmployment.be  This test is not yet approved or cleared by the [place] FDA and has been authorized for detection and/or diagnosis of SARS-CoV-2 by FDA under an Emergency Use Authorization (EUA). This EUA will remain in effect (meaning this test can be used) for the duration of the COVID-19 declaration under Section 564(b)(1) of the Act, 21 U.S.C. section 360bbb-3(b)(1), unless the authorization is terminated or revoked.  Performed at [facility], [address] [place], [place] [address]   Culture, blood (routine x 2)     Status: None   Collection Time: 05/14/21 11:26 AM   Specimen: BLOOD  Result Value Ref Range Status   Specimen Description BLOOD RIGHT ANTECUBITAL  Final   Special Requests   Final    BOTTLES DRAWN AEROBIC AND ANAEROBIC Blood Culture  adequate volume   Culture   Final     NO GROWTH 5 DAYS Performed at [facility], [address] [place], [place] [address]    Report Status 05/19/2021 FINAL  Final  Aerobic Culture w Gram Stain (superficial specimen)     Status: None   Collection Time: 05/15/21  5:42 PM   Specimen: Neck; Wound  Result Value Ref Range Status   Specimen Description   Final    NECK Performed at [facility], [address], [place], [place] [address]    Special Requests   Final    NONE Performed at [facility], [address] [place], [place] [address]    Gram Stain NO WBC SEEN NO ORGANISMS SEEN   Final   Culture   Final    RARE NORMAL SKIN FLORA NO GROUP A STREP (S.PYOGENES) ISOLATED NO STAPHYLOCOCCUS AUREUS ISOLATED Performed at [facility], [address] [place], [place] [address]    Report Status 05/18/2021 FINAL  Final  CULTURE, BLOOD (ROUTINE X 2) w Reflex to ID Panel     Status: None (Preliminary result)   Collection Time: 05/16/21  6:38 AM   Specimen: BLOOD  Result Value Ref Range Status   Specimen Description BLOOD LEFT ANTECUBITAL  Final   Special Requests   Final    BOTTLES DRAWN AEROBIC AND ANAEROBIC Blood Culture results may not be optimal due to an excessive volume of blood received in culture bottles   Culture   Final    NO GROWTH 4 DAYS Performed at [facility], [address] [place], [place] [address]    Report Status PENDING  Incomplete  CULTURE, BLOOD (ROUTINE X 2) w Reflex to ID Panel     Status: None (Preliminary result)   Collection Time: 05/16/21  6:51 AM   Specimen: BLOOD  Result Value Ref Range Status   Specimen Description BLOOD LEFT WRIST  Final   Special Requests   Final    BOTTLES DRAWN AEROBIC AND ANAEROBIC Blood Culture results may not be optimal due to an excessive volume of blood received in culture bottles   Culture   Final    NO GROWTH 4 DAYS Performed at [facility], [address], [place], [place] [address]    Report  Status PENDING  Incomplete     Time coordinating discharge: Over 30 minutes  SIGNED:   Wyvonnia Dusky, MD  [facility] 05/20/2021, 2:35 PM Pager   If 7PM-7AM, please contact night-coverage

## 2021-05-21 LAB — CULTURE, BLOOD (ROUTINE X 2)
Culture: NO GROWTH
Culture: NO GROWTH

## 2021-05-22 ENCOUNTER — Telehealth: Payer: Self-pay

## 2021-05-22 ENCOUNTER — Other Ambulatory Visit: Payer: Self-pay | Admitting: Family Medicine

## 2021-05-22 ENCOUNTER — Telehealth: Payer: Self-pay | Admitting: *Deleted

## 2021-05-22 NOTE — Telephone Encounter (Signed)
Transition Care Management Unsuccessful Follow-up Telephone Call  Date of discharge and from where:  05/20/2021 [facility]  Attempts:  1st Attempt  Reason for unsuccessful TCM follow-up call:  Left voice message

## 2021-05-22 NOTE — Telephone Encounter (Signed)
Can you look into this?

## 2021-05-22 NOTE — Telephone Encounter (Signed)
Patient called stating that he is having surgery onhis neck Friday and that Dr Janese Banks told him not to worry about his chemotherapy until he gets his neck fixed. He states he has appts next mont and wants to know what to do about them. Plesae advise

## 2021-05-22 NOTE — Telephone Encounter (Signed)
Call returned to patient and informed that per L Zenia Resides, NO who spoke with ID doc that there is no need to hold chemotherapy treatment. He thanked me for letting him know and will keep his appointment for 06/03/21

## 2021-05-23 ENCOUNTER — Telehealth: Payer: Self-pay

## 2021-05-23 NOTE — Telephone Encounter (Signed)
Transition Care Management Unsuccessful Follow-up Telephone Call  Date of discharge and from where:  05/20/2021 [facility]  Attempts:  2nd Attempt  Reason for unsuccessful TCM follow-up call:  Left voice message

## 2021-05-23 NOTE — Telephone Encounter (Signed)
Transition Care Management Unsuccessful Follow-up Telephone Call  Date of discharge and from where:  05/20/2021 [facility]  Attempts:  3rd Attempt  Reason for unsuccessful TCM follow-up call:  Left voice message

## 2021-05-23 NOTE — Telephone Encounter (Signed)
Pt returned call and requests call back. Cb# [phone]

## 2021-05-23 NOTE — Telephone Encounter (Signed)
Copied from [place] [id number]. Topic: General - Other >> May 22, 2021 11:00 AM Yvette Rack wrote: Reason for CRM: Pt stated he had a missed call from the office so he returned the call. Pt requests call back. Cb# [phone]

## 2021-05-29 DIAGNOSIS — C4442 Squamous cell carcinoma of skin of scalp and neck: Secondary | ICD-10-CM | POA: Diagnosis not present

## 2021-05-29 DIAGNOSIS — Z85828 Personal history of other malignant neoplasm of skin: Secondary | ICD-10-CM

## 2021-05-29 HISTORY — DX: Personal history of other malignant neoplasm of skin: Z85.828

## 2021-05-30 ENCOUNTER — Other Ambulatory Visit: Payer: Self-pay | Admitting: Family Medicine

## 2021-05-30 DIAGNOSIS — F4310 Post-traumatic stress disorder, unspecified: Secondary | ICD-10-CM

## 2021-05-30 DIAGNOSIS — K219 Gastro-esophageal reflux disease without esophagitis: Secondary | ICD-10-CM

## 2021-05-30 DIAGNOSIS — R059 Cough, unspecified: Secondary | ICD-10-CM

## 2021-05-30 DIAGNOSIS — F410 Panic disorder [episodic paroxysmal anxiety] without agoraphobia: Secondary | ICD-10-CM

## 2021-05-30 DIAGNOSIS — F3160 Bipolar disorder, current episode mixed, unspecified: Secondary | ICD-10-CM

## 2021-05-30 NOTE — Telephone Encounter (Signed)
Pt is scheduled 7/13

## 2021-05-30 NOTE — Telephone Encounter (Signed)
  Notes to clinic:  Medications filled by a different provider  Review for continued use and refill    Requested Prescriptions  Pending Prescriptions Disp Refills   DULoxetine (CYMBALTA) 60 MG capsule [Pharmacy Med Name: DULOXETINE HYDROCHLORIDE 60 MG Capsule Delayed Release Particles] 90 capsule 0    Sig: TAKE [place]      Psychiatry: Antidepressants - SNRI Passed - 05/30/2021  7:25 AM      Passed - Completed PHQ-2 or PHQ-9 in the last 360 days      Passed - Last BP in normal range    BP Readings from Last 1 Encounters:  05/20/21 135/87          Passed - Valid encounter within last 6 months    Recent Outpatient Visits           4 months ago Syncope and collapse   [facility] [place], Wendee Beavers, PA-C   5 months ago No-show for appointment   [facility] [place], Wendee Beavers, [place]   1 year ago Mixed bipolar I disorder [facility])   [facility] [place], [place], [place]   1 year ago Cough   [place], Vickki Muff, PA-C   1 year ago [place], Wendee Beavers, [place]       Future Appointments             In 6 days Laurence Ferrari, [place], [place]   In 1 week Vigg, Avanti, MD [facility], [place]   In 1 month [name], MD [place]   In 3 months Brendolyn Patty, MD [place]   In 3 months [facility], [place], MD [place]               famotidine (PEPCID) 20 MG tablet [Pharmacy Med Name: FAMOTIDINE 20 MG Tablet] 180 tablet 0    Sig: TAKE 1 TABLET TWICE DAILY      Gastroenterology:  H2 Antagonists Passed - 05/30/2021  7:25 AM      Passed - Valid encounter within last 12 months    Recent Outpatient Visits           4 months ago Syncope and collapse   [facility] [place], Wendee Beavers, PA-C   5 months ago No-show for appointment   [facility] Trinna Post, PA-C   1 year ago Mixed bipolar I  disorder [facility])   [facility] [place], [place], [place]   1 year ago Cough   [place], Vickki Muff, PA-C   1 year ago [place], Wendee Beavers, PA-C       Future Appointments             In 6 days Laurence Ferrari, [place], [place]   In 1 week Vigg, Avanti, MD [facility], [place]   In 1 month [name], MD [place]   In 3 months Brendolyn Patty, MD [place]   In 3 months [facility], [place], [place]

## 2021-06-03 ENCOUNTER — Inpatient Hospital Stay (HOSPITAL_BASED_OUTPATIENT_CLINIC_OR_DEPARTMENT_OTHER): Payer: Medicare HMO | Admitting: Oncology

## 2021-06-03 ENCOUNTER — Inpatient Hospital Stay: Payer: Medicare HMO

## 2021-06-03 ENCOUNTER — Ambulatory Visit: Payer: Self-pay | Admitting: Family Medicine

## 2021-06-03 ENCOUNTER — Other Ambulatory Visit: Payer: Self-pay

## 2021-06-03 ENCOUNTER — Inpatient Hospital Stay: Payer: Medicare HMO | Attending: Oncology

## 2021-06-03 VITALS — BP 142/96 | HR 120 | Temp 96.7°F | Resp 18 | Wt 226.0 lb

## 2021-06-03 DIAGNOSIS — Z79899 Other long term (current) drug therapy: Secondary | ICD-10-CM | POA: Diagnosis not present

## 2021-06-03 DIAGNOSIS — D369 Benign neoplasm, unspecified site: Secondary | ICD-10-CM | POA: Diagnosis not present

## 2021-06-03 DIAGNOSIS — C8392 Non-follicular (diffuse) lymphoma, unspecified, intrathoracic lymph nodes: Secondary | ICD-10-CM | POA: Diagnosis not present

## 2021-06-03 DIAGNOSIS — Z5111 Encounter for antineoplastic chemotherapy: Secondary | ICD-10-CM | POA: Insufficient documentation

## 2021-06-03 DIAGNOSIS — R000 Tachycardia, unspecified: Secondary | ICD-10-CM | POA: Diagnosis not present

## 2021-06-03 DIAGNOSIS — E785 Hyperlipidemia, unspecified: Secondary | ICD-10-CM | POA: Insufficient documentation

## 2021-06-03 DIAGNOSIS — Z79620 Long term (current) use of immunosuppressive biologic: Secondary | ICD-10-CM

## 2021-06-03 DIAGNOSIS — C44611 Basal cell carcinoma of skin of unspecified upper limb, including shoulder: Secondary | ICD-10-CM | POA: Diagnosis not present

## 2021-06-03 DIAGNOSIS — Z5181 Encounter for therapeutic drug level monitoring: Secondary | ICD-10-CM | POA: Diagnosis not present

## 2021-06-03 DIAGNOSIS — E291 Testicular hypofunction: Secondary | ICD-10-CM | POA: Diagnosis not present

## 2021-06-03 DIAGNOSIS — I10 Essential (primary) hypertension: Secondary | ICD-10-CM | POA: Insufficient documentation

## 2021-06-03 DIAGNOSIS — F17210 Nicotine dependence, cigarettes, uncomplicated: Secondary | ICD-10-CM | POA: Insufficient documentation

## 2021-06-03 DIAGNOSIS — C8296 Follicular lymphoma, unspecified, intrapelvic lymph nodes: Secondary | ICD-10-CM | POA: Diagnosis present

## 2021-06-03 DIAGNOSIS — C8202 Follicular lymphoma grade I, intrathoracic lymph nodes: Secondary | ICD-10-CM | POA: Diagnosis not present

## 2021-06-03 LAB — CBC WITH DIFFERENTIAL/PLATELET
Abs Immature Granulocytes: 0.02 10*3/uL (ref 0.00–0.07)
Basophils Absolute: 0.1 10*3/uL (ref 0.0–0.1)
Basophils Relative: 1 %
Eosinophils Absolute: 0.1 10*3/uL (ref 0.0–0.5)
Eosinophils Relative: 1 %
HCT: 34.3 % — ABNORMAL LOW (ref 39.0–52.0)
Hemoglobin: 11.9 g/dL — ABNORMAL LOW (ref 13.0–17.0)
Immature Granulocytes: 0 %
Lymphocytes Relative: 46 %
Lymphs Abs: 3.5 10*3/uL (ref 0.7–4.0)
MCH: 31.6 pg (ref 26.0–34.0)
MCHC: 34.7 g/dL (ref 30.0–36.0)
MCV: 91.2 fL (ref 80.0–100.0)
Monocytes Absolute: 1.1 10*3/uL — ABNORMAL HIGH (ref 0.1–1.0)
Monocytes Relative: 14 %
Neutro Abs: 2.9 10*3/uL (ref 1.7–7.7)
Neutrophils Relative %: 38 %
Platelets: 149 10*3/uL — ABNORMAL LOW (ref 150–400)
RBC: 3.76 MIL/uL — ABNORMAL LOW (ref 4.22–5.81)
RDW: 17.2 % — ABNORMAL HIGH (ref 11.5–15.5)
WBC: 7.7 10*3/uL (ref 4.0–10.5)
nRBC: 0 % (ref 0.0–0.2)

## 2021-06-03 LAB — COMPREHENSIVE METABOLIC PANEL
ALT: 21 U/L (ref 0–44)
AST: 27 U/L (ref 15–41)
Albumin: 3.4 g/dL — ABNORMAL LOW (ref 3.5–5.0)
Alkaline Phosphatase: 137 U/L — ABNORMAL HIGH (ref 38–126)
Anion gap: 8 (ref 5–15)
BUN: 10 mg/dL (ref 6–20)
CO2: 26 mmol/L (ref 22–32)
Calcium: 8.5 mg/dL — ABNORMAL LOW (ref 8.9–10.3)
Chloride: 103 mmol/L (ref 98–111)
Creatinine, Ser: 0.8 mg/dL (ref 0.61–1.24)
GFR, Estimated: >60 mL/min (ref >60)
Glucose, Bld: 125 mg/dL — ABNORMAL HIGH (ref 70–99)
Potassium: 3.8 mmol/L (ref 3.5–5.1)
Sodium: 137 mmol/L (ref 135–145)
Total Bilirubin: 0.8 mg/dL (ref 0.3–1.2)
Total Protein: 5.7 g/dL — ABNORMAL LOW (ref 6.5–8.1)

## 2021-06-03 MED ORDER — ACETAMINOPHEN 325 MG PO TABS: 650.0000 mg | ORAL_TABLET | Freq: Every day | ORAL | 0 refills | Status: DC

## 2021-06-03 MED ORDER — MONTELUKAST SODIUM 10 MG PO TABS: 10.0000 mg | ORAL_TABLET | Freq: Every day | ORAL | 0 refills | Status: DC

## 2021-06-03 MED ORDER — SODIUM CHLORIDE 0.9% FLUSH
10.0000 mL | Freq: Once | INTRAVENOUS | Status: AC
Start: 2021-06-03 — End: 2021-06-03
  Administered 2021-06-03: 10 mL via INTRAVENOUS
  Filled 2021-06-03: qty 10

## 2021-06-03 MED ORDER — PREDNISONE 50 MG PO TABS: ORAL_TABLET | ORAL | 0 refills | Status: DC

## 2021-06-03 MED ORDER — DIPHENHYDRAMINE HCL 25 MG PO CAPS: 25.0000 mg | ORAL_CAPSULE | Freq: Every day | ORAL | 0 refills | Status: DC

## 2021-06-03 MED ORDER — HEPARIN SOD (PORK) LOCK FLUSH 100 UNIT/ML IV SOLN
500.0000 [IU] | Freq: Once | INTRAVENOUS | Status: AC
Start: 2021-06-03 — End: ?
  Filled 2021-06-03: qty 5

## 2021-06-03 NOTE — Progress Notes (Signed)
   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 .  He sustained a left anterior frontal sinus as well as supraorbital rim fracture on 11/21/2019 which was managed conservatively.  He then presented to the ER at [facility] with symptoms of right-sided chest wall pain this led to a CT PE which did not show any evidence of pulmonary embolism.  He was noted to have bilateral symmetric axillary adenopathy measuring up to 1.8 cm.  Scattered mediastinal adenopathy.  Right paratracheal node measuring up to 1.2 cm.  Prominent right costophrenic lymph node measuring up to 1 cm.  Findings are nonspecific but could be seen with lymphoma versus systemic inflammatory disease such as sarcoidosis.  Of note patient has had a prior CT scan for lung screening back in 2015 when he was not noted to have this adenopathy.    Repeat CT chest in August 2021 showed persistent mild nonbulky axillary and mediastinal adenopathy which had not changed significantly as compared to his prior CT in December 2020.  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   Repeat CT in March 2022 showed Progression and multistation adenopathy as well as significant splenomegaly  measuring 20.4 cm as compared to 14.5 cm on CT scan September 2021.  CT scan showed multistation adenopathy with SUVs ranging between 6-8.9.  Patient underwent another core biopsy of right inguinal lymph node which was consistent with follicular lymphoma grade 1-2.  Bone marrow biopsy also showed moderate involvement with non-Hodgkin's B-cell lymphoma.  Baseline hepatitis B testing showed core antibody positivity.  Seen by GI and started on tenofovir  Patient had symptoms of nausea and sweating during cycle 1 of Rituxan which resolved with additional premedications.  He developed symptoms of itching over neck chest back and bilateral eyes with second dose of Rituxan early and had to get more premedications and was not rechallenged on the same day.  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  Reports doing well since his recent hospital admission.  He still has some ongoing nonproductive cough  ECOG PS- 1 Pain scale- 0   Review of systems- Review of Systems  Constitutional:  Positive for malaise/fatigue. Negative for chills, fever and weight loss.  HENT:  Negative for congestion, ear discharge and nosebleeds.   Eyes:  Negative for blurred vision.  Respiratory:  Positive for cough. Negative for hemoptysis, sputum production, shortness of breath and wheezing.   Cardiovascular:  Negative for chest pain, palpitations, orthopnea and claudication.  Gastrointestinal:  Negative for abdominal pain, blood in stool, constipation, diarrhea, heartburn, melena, nausea and vomiting.  Genitourinary:  Negative for dysuria, flank pain, frequency, hematuria and urgency.  Musculoskeletal:  Negative for back pain,   joint pain and myalgias.  Skin:  Negative for rash.  Neurological:  Negative for dizziness, tingling, focal weakness, seizures,  weakness and headaches.  Endo/Heme/Allergies:  Does not bruise/bleed easily.  Psychiatric/Behavioral:  Negative for depression and suicidal ideas. The patient does not have insomnia.      Allergies  Allergen Reactions   Penicillins Anaphylaxis    Tolerates cefdinir, cephalexin and amoxicillin/clavulonate so true PCN allergy unlikely   Tetanus Toxoids Swelling   Lisinopril Cough   Losartan      Other reaction(s): Muscle Pain     Tetanus Toxoid    Codeine Nausea Only and Nausea And Vomiting   Doxycycline Rash   Ruxience [Rituximab-Pvvr] Rash    05/06/21 pt w/ worsening rash during Ruxience infusion.  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HCC)    Arthritis    Asthma    Basal cell carcinoma 01/28/2021   R upper arm, EDC 03/04/2021   Brain 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   Chronic pain    right arm   Depression    Environmental and seasonal allergies    Follicular lymphoma (HCC)    Hypertension    Pneumonia    Sleep apnea    does not wear CPAP ; uses humidifier instead   Squamous cell carcinoma of skin 02/19/2021   L inferior mandible, treated with EDC      Past Surgical History:  Procedure Laterality Date   ANTERIOR CERVICAL DECOMP/DISCECTOMY FUSION N/A 06/17/2016   Procedure: ANTERIOR CERVICAL DECOMPRESSION FUSION, CERVICAL 3-4, CERVICAL 4-5 WITH INSTRUMENTATION AND ALLOGRAFT;  Surgeon: Mark Dumonski, MD;  Location: [facility] OR;  Service: Orthopedics;  Laterality: N/A;  ANTERIOR CERVICAL DECOMPRESSION FUSION, CERVICAL 3-4, CERVICAL 4-5 WITH INSTRUMENTATION AND ALLOGRAFT   BACK SURGERY     NECK SURGERY  2009   PORTA CATH INSERTION N/A 04/03/2021   Procedure: PORTA CATH INSERTION;  Surgeon: Dew, Jason S, MD;  Location: [facility] INVASIVE CV LAB;  Service: Cardiovascular;  Laterality: N/A;    Social History   Socioeconomic History   Marital status: Divorced    Spouse name: Not on file    Number of children: Not on file   Years of education: Not on file   Highest education level: Not on file  Occupational History   Not on file  Tobacco Use   Smoking status: Every Day    Pack years: 0.00    Types: Cigars   Smokeless tobacco: Never   Tobacco comments:    5-6 cigars a day  Vaping Use   Vaping Use: Former   Start date: 04/30/2017   Quit date: 11/30/2017  Substance and Sexual Activity   Alcohol use: Not Currently    Alcohol/week: 0.0 standard drinks   Drug use: No   Sexual activity: Not Currently  Other Topics Concern   Not on file  Social History Narrative   Not on file   Social Determinants of Health   Financial Resource Strain: Not on file  Food Insecurity: Not on file  Transportation Needs: Not on file  Physical Activity: Not on file  Stress: Not on file  Social Connections: Not on file  Intimate Partner Violence: Not on file    Family History  Problem Relation Age of Onset   Diabetes Mother    Diabetes Other      Current Outpatient Medications:    acyclovir (ZOVIRAX) 400 MG tablet, Take   1 tablet (400 mg total) by mouth 2 (two) times daily., Disp: 60 tablet, Rfl: 2   allopurinol (ZYLOPRIM) 300 MG tablet, Take 1 tablet (300 mg total) by mouth daily., Disp: 30 tablet, Rfl: 3   amLODipine (NORVASC) 5 MG tablet, Take 1 tablet (5 mg total) by mouth daily., Disp: 90 tablet, Rfl: 0   aspirin EC 81 MG tablet, Take 81 mg by mouth daily. Swallow whole., Disp: , Rfl:    atorvastatin (LIPITOR) 40 MG tablet, Take 1 tablet (40 mg total) by mouth daily., Disp: 90 tablet, Rfl: 4   benzonatate (TESSALON) 200 MG capsule, Take 1 capsule (200 mg total) by mouth 3 (three) times daily as needed for cough., Disp: 30 capsule, Rfl: 0   BREO ELLIPTA 100-25 MCG/INH AEPB, Inhale 1 puff into the lungs daily., Disp: , Rfl:    buPROPion (WELLBUTRIN XL) 150 MG 24 hr tablet, Take 1 tablet by mouth every morning., Disp: , Rfl:    clonazePAM (KLONOPIN) 0.5 MG tablet, Take 0.5 mg by mouth  daily., Disp: , Rfl:    dexamethasone (DECADRON) 4 MG tablet, Take 2 tablets (8 mg total) by mouth daily. Start the day after bendamustine chemotherapy for 2 days. Take with food., Disp: 30 tablet, Rfl: 1   DULoxetine (CYMBALTA) 60 MG capsule, TAKE 1 CAPSULE EVERY DAY, Disp: 90 capsule, Rfl: 0   famotidine (PEPCID) 20 MG tablet, TAKE 1 TABLET TWICE DAILY, Disp: 180 tablet, Rfl: 0   Ferrous Sulfate (IRON) 142 (45 Fe) MG TBCR, Take 1 tablet by mouth as needed., Disp: , Rfl:    finasteride (PROSCAR) 5 MG tablet, TAKE 1 TABLET BY MOUTH ONCE DAILY, Disp: 90 tablet, Rfl: 2   HYDROcodone bit-homatropine (HYCODAN) 5-1.5 MG/5ML syrup, Take 5 mLs by mouth every 4 (four) hours as needed for cough., Disp: 120 mL, Rfl: 0   lansoprazole (PREVACID) 30 MG capsule, TAKE 1 CAPSULE BY MOUTH ONCE DAILY AT NOON, Disp: 30 capsule, Rfl: 2   lidocaine-prilocaine (EMLA) cream, Apply to affected area once, Disp: 30 g, Rfl: 3   loratadine (CLARITIN) 10 MG tablet, Take 10 mg by mouth daily. , Disp: , Rfl:    metoprolol succinate (TOPROL-XL) 25 MG 24 hr tablet, Take 1 tablet (25 mg total) by mouth daily., Disp: 90 tablet, Rfl: 1   mirtazapine (REMERON) 15 MG tablet, Take 15 mg by mouth at bedtime., Disp: , Rfl:    modafinil (PROVIGIL) 200 MG tablet, Take 200 mg by mouth daily., Disp: , Rfl:    montelukast (SINGULAIR) 10 MG tablet, Take 1 tablet (10 mg total) by mouth at bedtime., Disp: 90 tablet, Rfl: 1   mupirocin ointment (BACTROBAN) 2 %, Apply 1 application topically 2 (two) times daily., Disp: 22 g, Rfl: 0   naltrexone (DEPADE) 50 MG tablet, , Disp: , Rfl:    OLANZapine (ZYPREXA) 7.5 MG tablet, Take 7.5 mg by mouth at bedtime., Disp: , Rfl:    ondansetron (ZOFRAN) 8 MG tablet, Take 1 tablet (8 mg total) by mouth 2 (two) times daily as needed for refractory nausea / vomiting. Start on day 2 after bendamustine chemo., Disp: 30 tablet, Rfl: 1   prochlorperazine (COMPAZINE) 10 MG tablet, TAKE 1 TABLET BY MOUTH EVERY 6 HOURS AS  NEEDED NAUSEA AND VOMITING, Disp: 30 tablet, Rfl: 1   tacrolimus (PROTOPIC) 0.1 % ointment, Apply 1 application topically 2 (two) times daily., Disp: , Rfl:    tadalafil (CIALIS) 20 MG tablet, 1 tab 1 hour prior to intercourse, Disp: 10   tablet, Rfl: 0   tenofovir (VIREAD) 300 MG tablet, Take 1 tablet (300 mg total) by mouth daily., Disp: 90 tablet, Rfl: 2   testosterone cypionate (DEPOTESTOSTERONE CYPIONATE) 200 MG/ML injection, Inject 1 mL (200 mg total) into the muscle every 14 (fourteen) days., Disp: 10 mL, Rfl: 0   triamcinolone (KENALOG) 0.1 %, Apply to affected areas 1-2 times a day until rash improved. Avoid face, groin, underarms., Disp: 80 g, Rfl: 1   acetaminophen (TYLENOL) 325 MG tablet, Take 2 tablets (650 mg total) by mouth daily. for 5 days starting 2 days prior to treatment, Disp: 30 tablet, Rfl: 0   diphenhydrAMINE (BENADRYL) 25 mg capsule, Take 1 capsule (25 mg total) by mouth daily for 1 dose. Take 1 capsule daily for 5 days starting 2 days prior to treatment, Disp: 30 capsule, Rfl: 0   montelukast (SINGULAIR) 10 MG tablet, Take 1 tablet (10 mg total) by mouth at bedtime. for 5 days starting 2 days prior to treatment, Disp: 30 tablet, Rfl: 0   predniSONE (DELTASONE) 50 MG tablet, Take 1 tablet daily for 5 days starting 2 days prior to treatment, Disp: 30 tablet, Rfl: 0   traMADol (ULTRAM) 50 MG tablet, Take 50 mg by mouth every 6 (six) hours as needed., Disp: , Rfl:  No current facility-administered medications for this visit.  Facility-Administered Medications Ordered in Other Visits:    heparin lock flush 100 unit/mL, 500 Units, Intravenous, Once, ,  C, MD  Physical exam:  Vitals:   06/03/21 0858  BP: (!) 142/96  Pulse: (!) 120  Resp: 18  Temp: (!) 96.7 F (35.9 C)  SpO2: 98%  Weight: 226 lb (102.5 kg)   Physical Exam Cardiovascular:     Rate and Rhythm: Regular rhythm. Tachycardia present.     Heart sounds: Normal heart sounds.  Pulmonary:     Effort:  Pulmonary effort is normal.     Breath sounds: Normal breath sounds.  Abdominal:     General: Bowel sounds are normal.     Palpations: Abdomen is soft.  Lymphadenopathy:     Comments: No palpable cervical adenopathy.  Palpable long bulky right axillary adenopathy.  Skin:    General: Skin is warm and dry.  Neurological:     Mental Status: He is alert and oriented to person, place, and time.     CMP Latest Ref Rng & Units 06/03/2021  Glucose 70 - 99 mg/dL 125(H)  BUN 6 - 20 mg/dL 10  Creatinine 0.61 - 1.24 mg/dL 0.80  Sodium 135 - 145 mmol/L 137  Potassium 3.5 - 5.1 mmol/L 3.8  Chloride 98 - 111 mmol/L 103  CO2 22 - 32 mmol/L 26  Calcium 8.9 - 10.3 mg/dL 8.5(L)  Total Protein 6.5 - 8.1 g/dL 5.7(L)  Total Bilirubin 0.3 - 1.2 mg/dL 0.8  Alkaline Phos 38 - 126 U/L 137(H)  AST 15 - 41 U/L 27  ALT 0 - 44 U/L 21   CBC Latest Ref Rng & Units 06/03/2021  WBC 4.0 - 10.5 K/uL 7.7  Hemoglobin 13.0 - 17.0 g/dL 11.9(L)  Hematocrit 39.0 - 52.0 % 34.3(L)  Platelets 150 - 400 K/uL 149(L)    No images are attached to the encounter.  DG Chest Portable 1 View  Result Date: 05/14/2021 CLINICAL DATA:  Weakness and tachycardia.  Lymphoma EXAM: PORTABLE CHEST 1 VIEW COMPARISON:  01/06/2014 FINDINGS: Cardiac and mediastinal contours normal. No adenopathy or mass. Lungs well aerated and clear Right jugular Port-A-Cath in place. No priors   for comparison for catheter positioning. The catheter loops in the right internal jugular vein with the tip in the region of the right innominate vein. IMPRESSION: No acute abnormality. Right jugular Port-A-Cath tip in the region of the right innominate vein. Electronically Signed   By: Charles  Clark M.D.   On: 05/14/2021 11:33     Assessment and plan- Patient is a 59 y.o. male with stage IV follicular lymphoma here for on treatment assessment prior to cycle 3 of Bendamustine Rituxan chemotherapy  Patient recently had excision of his left neck wound about 4 days ago  and the wound appears to be healing well but there are areas of surrounding inflammation around the suture site.  I would like to allow the wound to heal further for the next 1 week and pushes chemo out by 1 more week.  Given that he had significant reaction to Rituxan with cycle 2 I would like him to take the following premedications  Prednisone 50 mg starting 2 days prior to chemotherapy for a total of 5 days and not to take on the day of chemotherapy. Singulair 10 mg daily starting 2 days prior to chemotherapy for 5 days Benadryl 25 mg starting 2 days prior to chemotherapy for 5 days and not to take on the day of chemotherapy Tylenol 650 mg daily for 5 days starting 2 days prior to chemotherapy Pepcid 20 mg twice daily starting 2 days prior to chemotherapy and not to take on the day of chemotherapy.  I will also give him Rituxan as a split dose over 3 days instead of 1 day.  He will receive Bendamustine on day 1 and day 2 starting next week.  I will see him back in 2 weeks to see how he tolerated cycle 3 of Bendamustine Rituxan chemotherapy   Visit Diagnosis 1. 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)   2. Encounter for antineoplastic chemotherapy   3. Encounter for monitoring rituximab therapy      Dr.  , MD, MPH [facility] at [facility] [phone] 06/03/2021 12:49 PM                

## 2021-06-04 ENCOUNTER — Other Ambulatory Visit: Payer: Self-pay

## 2021-06-04 ENCOUNTER — Encounter: Payer: Self-pay | Admitting: Internal Medicine

## 2021-06-04 ENCOUNTER — Inpatient Hospital Stay: Payer: Medicare HMO

## 2021-06-04 ENCOUNTER — Ambulatory Visit (INDEPENDENT_AMBULATORY_CARE_PROVIDER_SITE_OTHER): Payer: Medicare HMO | Admitting: Internal Medicine

## 2021-06-04 VITALS — BP 133/88 | HR 123 | Temp 99.3°F | Ht 72.4 in | Wt 224.8 lb

## 2021-06-04 DIAGNOSIS — Z131 Encounter for screening for diabetes mellitus: Secondary | ICD-10-CM | POA: Diagnosis not present

## 2021-06-04 DIAGNOSIS — E785 Hyperlipidemia, unspecified: Secondary | ICD-10-CM

## 2021-06-04 DIAGNOSIS — I700 Atherosclerosis of aorta: Secondary | ICD-10-CM | POA: Diagnosis not present

## 2021-06-04 DIAGNOSIS — I10 Essential (primary) hypertension: Secondary | ICD-10-CM

## 2021-06-04 DIAGNOSIS — F603 Borderline personality disorder: Secondary | ICD-10-CM | POA: Diagnosis not present

## 2021-06-04 DIAGNOSIS — Z1329 Encounter for screening for other suspected endocrine disorder: Secondary | ICD-10-CM

## 2021-06-04 MED ORDER — METOPROLOL SUCCINATE ER 25 MG PO TB24: 25.0000 mg | ORAL_TABLET | Freq: Every day | ORAL | 1 refills | Status: DC

## 2021-06-04 NOTE — Patient Instructions (Signed)
Ankle Exercises Ask your health care provider which exercises are safe for you. Do exercises exactly as told by your health care provider and adjust them as directed. It is normal to feel mild stretching, pulling, tightness, or mild discomfort as you do these exercises. Stop right away if you feel sudden pain or your pain gets worse. Do notbegin these exercises until told by your health care provider. Stretching and range-of-motion exercises These exercises warm up your muscles and joints and improve the movement andflexibility of your ankle. These exercises may also help to relieve pain. Dorsiflexion/plantar flexion  Sit with your __________ knee straight or bent. Do not rest your foot on anything. Flex your __________ ankle to tilt the top of your foot toward your shin. This is called dorsiflexion. Hold this position for __________ seconds. Point your toes downward to tilt the top of your foot away from your shin. This is called plantar flexion. Hold this position for __________ seconds. Repeat __________ times. Complete this exercise __________ times a day. Ankle alphabet  Sit with your __________ foot supported at your lower leg. Do not rest your foot on anything. Make sure your foot has room to move freely. Think of your __________ foot as a paintbrush: Move your foot to trace each letter of the alphabet in the air. Keep your hip and knee still while you trace the letters. Trace every letter from A to Z. Make the letters as large as you can without causing or increasing any discomfort. Repeat __________ times. Complete this exercise __________ times a day. Passive ankle dorsiflexion This is an exercise in which something or someone moves your ankle for you. You do not move it yourself. Sit on a chair that is placed on a non-carpeted surface. Place your __________ foot on the floor, directly under your __________ knee. Extend your __________ leg for support. Keeping your heel down, slide  your __________ foot back toward the chair until you feel a stretch at your ankle or calf. If you do not feel a stretch, slide your buttocks forward to the edge of the chair while keeping your heel down. Hold this stretch for __________ seconds. Repeat __________ times. Complete this exercise __________ times a day. Strengthening exercises These exercises build strength and endurance in your ankle. Endurance is theability to use your muscles for a long time, even after they get tired. Dorsiflexors These are muscles that lift your foot up. Secure a rubber exercise band or tube to an object, such as a table leg, that will stay still when the band is pulled. Secure the other end around your __________ foot. Sit on the floor, facing the object with your __________ leg extended. The band or tube should be slightly tense when your foot is relaxed. Slowly flex your __________ ankle and toes to bring your foot toward your shin. Hold this position for __________ seconds. Slowly return your foot to the starting position, controlling the band as you do that. Repeat __________ times. Complete this exercise __________ times a day. Plantar flexors These are muscles that push your foot down. Sit on the floor with your __________ leg extended. Loop a rubber exercise band or tube around the ball of your __________ foot. The ball of your foot is on the walking surface, right under your toes. The band or tube should be slightly tense when your foot is relaxed. Slowly point your toes downward, pushing them away from you. Hold this position for __________ seconds. Slowly release the tension in the  band or tube, controlling smoothly until your foot is back in the starting position. Repeat __________ times. Complete this exercise __________ times a day. Towel curls  Sit in a chair on a non-carpeted surface, and put your feet on the floor. Place a towel in front of your feet. If told by your health care provider,  add a __________ pound weight to the end of the towel. Keeping your heel on the floor, put your __________ foot on the towel. Pull the towel toward you by grabbing the towel with your toes and curling them under. Keep your heel on the floor. Let your toes relax. Grab the towel again. Keep pulling the towel until it is completely underneath your foot. Repeat __________ times. Complete this exercise __________ times a day. Standing plantar flexion This is an exercise in which you use your toes to lift your body's weight while standing. Stand with your feet shoulder-width apart. Keep your weight spread evenly over the width of your feet while you rise up on your toes. Use a wall or table to steady yourself if needed, but try not to use it for support. If this exercise is too easy, try these options: Shift your weight toward your __________ leg until you feel challenged. If told by your health care provider, lift your uninjured leg off the floor. Hold this position for __________ seconds. Repeat __________ times. Complete this exercise __________ times a day. Tandem walking Stand with one foot directly in front of the other. Slowly raise your back foot up, lifting your heel before your toes, and place it directly in front of your other foot. Continue to walk in this heel-to-toe way for __________ or for as long as told by your health care provider. Have a countertop or wall nearby to use if needed to keep your balance, but try not to hold onto anything for support. Repeat __________ times. Complete this exercise __________ times a day. This information is not intended to replace advice given to you by your health care provider. Make sure you discuss any questions you have with your healthcare provider. Document Revised: 08/13/2018 Document Reviewed: 08/15/2018 Elsevier Patient Education  [place].

## 2021-06-04 NOTE — Progress Notes (Addendum)
BP 133/88   Pulse (!) 123   Temp 99.3 F (37.4 C) (Oral)   Ht 6' 0.4" (1.839 m)   Wt 224 lb 12.8 oz (102 kg)   SpO2 95%   BMI 30.15 kg/m    Subjective:    Patient ID: Russell Simpson, male    DOB: 22-May-1961, 60 y.o.   MRN: [id number]  Chief Complaint  Patient presents with  . New Patient (Initial Visit)    Was unhappy at [facility]    HPI:  Russell Simpson is a 60 y.o. male he was a pt at [facility] trasnferring care. Feels like his ankles are sore.   Pt Is here for a hospital follow up Admit date: 05/14/2021 Discharge date: 05/20/2021  Was diagnosed with SCC of the neck and was sent to the ER sec to OP failure of cellulitis tx, was IP x 7 days.  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 Janese Banks for such ,just started getting rx for such, was supposed to get chemo x 2 , pt has   Per ;pt Lymphadenopathy under arms are better per his verbal record.    Chief Complaint  Patient presents with  . New Patient (Initial Visit)    Was unhappy at [facility]    Relevant past medical, surgical, family and social history reviewed and updated as indicated. Interim medical history since our last visit reviewed. Allergies and medications reviewed and updated.  Review of Systems  Per HPI unless specifically indicated above     Objective:    BP 133/88   Pulse (!) 123   Temp 99.3 F (37.4 C) (Oral)   Ht 6' 0.4" (1.839 m)   Wt 224 lb 12.8 oz (102 kg)   SpO2 95%   BMI 30.15 kg/m   Wt Readings from Last 3 Encounters:  06/04/21 224 lb 12.8 oz (102 kg)  06/03/21 226 lb (102.5 kg)  05/14/21 230 lb (104.3 kg)    Physical Exam Vitals and nursing note reviewed.  Constitutional:      General: He is not in acute distress.    Appearance: Normal appearance. He is not ill-appearing or diaphoretic.  HENT:     Head: Normocephalic and atraumatic.     Right Ear: Tympanic membrane and external ear normal. There is no  impacted cerumen.     Left Ear: External ear normal.     Nose: No congestion or rhinorrhea.     Mouth/Throat:     Pharynx: No oropharyngeal exudate or posterior oropharyngeal erythema.  Eyes:     Conjunctiva/sclera: Conjunctivae normal.     Pupils: Pupils are equal, round, and reactive to light.  Cardiovascular:     Rate and Rhythm: Normal rate and regular rhythm.     Heart sounds: No murmur heard.   No friction rub. No gallop.  Pulmonary:     Effort: No respiratory distress.     Breath sounds: No stridor. No wheezing or rhonchi.  Chest:     Chest wall: No tenderness.  Abdominal:     General: Abdomen is flat. Bowel sounds are normal.     Palpations: Abdomen is soft. There is no mass.     Tenderness: There is no abdominal tenderness.  Musculoskeletal:     Cervical back: Normal range of motion and neck supple. No rigidity or tenderness.     Left lower leg: No edema.  Skin:  General: Skin is warm and dry.     Findings: Lesion present.     Comments: Right sided scar noted from his recent biopsy  Neurological:     Mental Status: He is alert.   Results for orders placed or performed in visit on 06/03/21  Comprehensive metabolic panel  Result Value Ref Range   Sodium 137 135 - 145 mmol/L   Potassium 3.8 3.5 - 5.1 mmol/L   Chloride 103 98 - 111 mmol/L   CO2 26 22 - 32 mmol/L   Glucose, Bld 125 (H) 70 - 99 mg/dL   BUN 10 6 - 20 mg/dL   Creatinine, Ser 0.80 0.61 - 1.24 mg/dL   Calcium 8.5 (L) 8.9 - 10.3 mg/dL   Total Protein 5.7 (L) 6.5 - 8.1 g/dL   Albumin 3.4 (L) 3.5 - 5.0 g/dL   AST 27 15 - 41 U/L   ALT 21 0 - 44 U/L   Alkaline Phosphatase 137 (H) 38 - 126 U/L   Total Bilirubin 0.8 0.3 - 1.2 mg/dL   GFR, Estimated >60 >60 mL/min   Anion gap 8 5 - 15  CBC with Differential  Result Value Ref Range   WBC 7.7 4.0 - 10.5 K/uL   RBC 3.76 (L) 4.22 - 5.81 MIL/uL   Hemoglobin 11.9 (L) 13.0 - 17.0 g/dL   HCT 34.3 (L) 39.0 - 52.0 %   MCV 91.2 80.0 - 100.0 fL   MCH 31.6 26.0 -  34.0 pg   MCHC 34.7 30.0 - 36.0 g/dL   RDW 17.2 (H) 11.5 - 15.5 %   Platelets 149 (L) 150 - 400 K/uL   nRBC 0.0 0.0 - 0.2 %   Neutrophils Relative % 38 %   Neutro Abs 2.9 1.7 - 7.7 K/uL   Lymphocytes Relative 46 %   Lymphs Abs 3.5 0.7 - 4.0 K/uL   Monocytes Relative 14 %   Monocytes Absolute 1.1 (H) 0.1 - 1.0 K/uL   Eosinophils Relative 1 %   Eosinophils Absolute 0.1 0.0 - 0.5 K/uL   Basophils Relative 1 %   Basophils Absolute 0.1 0.0 - 0.1 K/uL   Immature Granulocytes 0 %   Abs Immature Granulocytes 0.02 0.00 - 0.07 K/uL        Current Outpatient Medications:  .  acetaminophen (TYLENOL) 325 MG tablet, Take 2 tablets (650 mg total) by mouth daily. for 5 days starting 2 days prior to treatment, Disp: 30 tablet, Rfl: 0 .  acyclovir (ZOVIRAX) 400 MG tablet, Take 1 tablet (400 mg total) by mouth 2 (two) times daily., Disp: 60 tablet, Rfl: 2 .  allopurinol (ZYLOPRIM) 300 MG tablet, Take 1 tablet (300 mg total) by mouth daily., Disp: 30 tablet, Rfl: 3 .  amLODipine (NORVASC) 5 MG tablet, Take 1 tablet (5 mg total) by mouth daily., Disp: 90 tablet, Rfl: 0 .  atorvastatin (LIPITOR) 40 MG tablet, Take 1 tablet (40 mg total) by mouth daily., Disp: 90 tablet, Rfl: 4 .  buPROPion (WELLBUTRIN XL) 150 MG 24 hr tablet, Take 1 tablet by mouth every morning., Disp: , Rfl:  .  clonazePAM (KLONOPIN) 0.5 MG tablet, Take 0.5 mg by mouth daily., Disp: , Rfl:  .  dexamethasone (DECADRON) 4 MG tablet, Take 2 tablets (8 mg total) by mouth daily. Start the day after bendamustine chemotherapy for 2 days. Take with food., Disp: 30 tablet, Rfl: 1 .  diphenhydrAMINE (BENADRYL) 25 mg capsule, Take 1 capsule (25 mg total) by mouth daily for 1  dose. Take 1 capsule daily for 5 days starting 2 days prior to treatment, Disp: 30 capsule, Rfl: 0 .  DULoxetine (CYMBALTA) 60 MG capsule, TAKE 1 CAPSULE EVERY DAY, Disp: 90 capsule, Rfl: 0 .  famotidine (PEPCID) 20 MG tablet, TAKE 1 TABLET TWICE DAILY, Disp: 180 tablet, Rfl:  0 .  finasteride (PROSCAR) 5 MG tablet, TAKE 1 TABLET BY MOUTH ONCE DAILY, Disp: 90 tablet, Rfl: 2 .  lansoprazole (PREVACID) 30 MG capsule, TAKE 1 CAPSULE BY MOUTH ONCE DAILY AT NOON, Disp: 30 capsule, Rfl: 2 .  lidocaine-prilocaine (EMLA) cream, Apply to affected area once, Disp: 30 g, Rfl: 3 .  loratadine (CLARITIN) 10 MG tablet, Take 10 mg by mouth daily. , Disp: , Rfl:  .  metoprolol succinate (TOPROL-XL) 25 MG 24 hr tablet, Take 1 tablet (25 mg total) by mouth daily., Disp: 90 tablet, Rfl: 1 .  mirtazapine (REMERON) 15 MG tablet, Take 15 mg by mouth at bedtime., Disp: , Rfl:  .  modafinil (PROVIGIL) 200 MG tablet, Take 200 mg by mouth daily., Disp: , Rfl:  .  mupirocin ointment (BACTROBAN) 2 %, Apply 1 application topically 2 (two) times daily., Disp: 22 g, Rfl: 0 .  naltrexone (DEPADE) 50 MG tablet, , Disp: , Rfl:  .  OLANZapine (ZYPREXA) 7.5 MG tablet, Take 7.5 mg by mouth at bedtime., Disp: , Rfl:  .  ondansetron (ZOFRAN) 8 MG tablet, Take 1 tablet (8 mg total) by mouth 2 (two) times daily as needed for refractory nausea / vomiting. Start on day 2 after bendamustine chemo., Disp: 30 tablet, Rfl: 1 .  predniSONE (DELTASONE) 50 MG tablet, Take 1 tablet daily for 5 days starting 2 days prior to treatment, Disp: 30 tablet, Rfl: 0 .  prochlorperazine (COMPAZINE) 10 MG tablet, TAKE 1 TABLET BY MOUTH EVERY 6 HOURS AS NEEDED NAUSEA AND VOMITING, Disp: 30 tablet, Rfl: 1 .  tacrolimus (PROTOPIC) 0.1 % ointment, Apply 1 application topically 2 (two) times daily., Disp: , Rfl:  .  tadalafil (CIALIS) 20 MG tablet, 1 tab 1 hour prior to intercourse, Disp: 10 tablet, Rfl: 0 .  tenofovir (VIREAD) 300 MG tablet, Take 1 tablet (300 mg total) by mouth daily., Disp: 90 tablet, Rfl: 2 .  testosterone cypionate (DEPOTESTOSTERONE CYPIONATE) 200 MG/ML injection, Inject 1 mL (200 mg total) into the muscle every 14 (fourteen) days., Disp: 10 mL, Rfl: 0 .  aspirin EC 81 MG tablet, Take 81 mg by mouth daily. Swallow  whole. (Patient not taking: Reported on 06/04/2021), Disp: , Rfl:  .  BREO ELLIPTA 100-25 MCG/INH AEPB, Inhale 1 puff into the lungs daily. (Patient not taking: Reported on 06/04/2021), Disp: , Rfl:  .  HYDROcodone bit-homatropine (HYCODAN) 5-1.5 MG/5ML syrup, Take 5 mLs by mouth every 4 (four) hours as needed for cough. (Patient not taking: Reported on 06/04/2021), Disp: 120 mL, Rfl: 0 .  montelukast (SINGULAIR) 10 MG tablet, Take 1 tablet (10 mg total) by mouth at bedtime. (Patient not taking: Reported on 06/04/2021), Disp: 90 tablet, Rfl: 1 .  montelukast (SINGULAIR) 10 MG tablet, Take 1 tablet (10 mg total) by mouth at bedtime. for 5 days starting 2 days prior to treatment (Patient not taking: Reported on 06/04/2021), Disp: 30 tablet, Rfl: 0 .  traMADol (ULTRAM) 50 MG tablet, Take 50 mg by mouth every 6 (six) hours as needed. (Patient not taking: Reported on 06/04/2021), Disp: , Rfl:  .  triamcinolone (KENALOG) 0.1 %, Apply to affected areas 1-2 times a day until  rash improved. Avoid face, groin, underarms. (Patient not taking: Reported on 06/04/2021), Disp: 80 g, Rfl: 1 No current facility-administered medications for this visit.  Facility-Administered Medications Ordered in Other Visits:  .  heparin lock flush 100 unit/mL, 500 Units, Intravenous, Once, Sindy Guadeloupe, MD    Assessment & Plan:  BPD  is seeing Psych for such  PTSD / Is on cymbalta/ Klonipin/ Wellbutrin xl / modafinil/ naltrexone  Olanzapine/ remeron  2. COPD is on breo for such and Singulair Feels better Non complaint with breo Compliance advised.   3. non-Hodgkin's B-cell lymphoma.Lymphoma on chemo sees Dr. Janese Banks @ cancer centre in [place].  S/p chemo x  2 , needs one next week  To have meds prophylactically.   4. SCC of neck fu and mx per derm.   5. HTN is on metoprolol xl for such Continue current meds.  Medication compliance emphasised. pt advised to keep Bp logs. Pt verbalised understanding of the same. Pt to have a low  salt diet . Exercise to reach a goal of at least 150 mins a week.  lifestyle modifications explained and pt understands importance of the above.  6. Hyperglycemia :  Check a1c before next visit.  Lifestyle modifications advised to pt.   Portion control and avoiding high carb low fat diet advised.  Diet plan given to pt   exercise plan given and encouraged.  To increase exercise to 150 mins a week ie 21/2 hours a week. Pt verbalises understanding of the above.    Problem List Items Addressed This Visit   None    No orders of the defined types were placed in this encounter.    Meds ordered this encounter  Medications  . metoprolol succinate (TOPROL-XL) 25 MG 24 hr tablet    Sig: Take 1 tablet (25 mg total) by mouth daily.    Dispense:  90 tablet    Refill:  1     Follow up plan: Return in about 6 weeks (around 07/16/2021). CBC, CMP, FLP, HBA1C, TSH Labs 1 week prior to next visit.

## 2021-06-05 ENCOUNTER — Ambulatory Visit: Payer: Self-pay | Admitting: Dermatology

## 2021-06-10 ENCOUNTER — Other Ambulatory Visit: Payer: Self-pay | Admitting: Oncology

## 2021-06-10 ENCOUNTER — Other Ambulatory Visit: Payer: Self-pay

## 2021-06-10 ENCOUNTER — Inpatient Hospital Stay: Payer: Medicare HMO

## 2021-06-10 VITALS — BP 138/95 | HR 104 | Temp 97.1°F | Resp 20 | Wt 225.5 lb

## 2021-06-10 DIAGNOSIS — F17210 Nicotine dependence, cigarettes, uncomplicated: Secondary | ICD-10-CM | POA: Diagnosis not present

## 2021-06-10 DIAGNOSIS — D369 Benign neoplasm, unspecified site: Secondary | ICD-10-CM | POA: Diagnosis not present

## 2021-06-10 DIAGNOSIS — E291 Testicular hypofunction: Secondary | ICD-10-CM | POA: Diagnosis not present

## 2021-06-10 DIAGNOSIS — C8202 Follicular lymphoma grade I, intrathoracic lymph nodes: Secondary | ICD-10-CM

## 2021-06-10 DIAGNOSIS — E785 Hyperlipidemia, unspecified: Secondary | ICD-10-CM | POA: Diagnosis not present

## 2021-06-10 DIAGNOSIS — C44611 Basal cell carcinoma of skin of unspecified upper limb, including shoulder: Secondary | ICD-10-CM | POA: Diagnosis not present

## 2021-06-10 DIAGNOSIS — I10 Essential (primary) hypertension: Secondary | ICD-10-CM | POA: Diagnosis not present

## 2021-06-10 DIAGNOSIS — C8392 Non-follicular (diffuse) lymphoma, unspecified, intrathoracic lymph nodes: Secondary | ICD-10-CM | POA: Diagnosis not present

## 2021-06-10 DIAGNOSIS — R000 Tachycardia, unspecified: Secondary | ICD-10-CM | POA: Diagnosis not present

## 2021-06-10 DIAGNOSIS — Z5111 Encounter for antineoplastic chemotherapy: Secondary | ICD-10-CM | POA: Diagnosis not present

## 2021-06-10 MED ORDER — MONTELUKAST SODIUM 10 MG PO TABS
10.0000 mg | ORAL_TABLET | Freq: Once | ORAL | Status: AC
  Administered 2021-06-10: 10 mg via ORAL
  Filled 2021-06-10: qty 1

## 2021-06-10 MED ORDER — SODIUM CHLORIDE 0.9 % IV SOLN
300.0000 mg | Freq: Once | INTRAVENOUS | Status: AC
  Administered 2021-06-10: 300 mg via INTRAVENOUS
  Filled 2021-06-10: qty 30

## 2021-06-10 MED ORDER — FAMOTIDINE 20 MG IN NS 100 ML IVPB
20.0000 mg | Freq: Once | INTRAVENOUS | Status: AC
  Administered 2021-06-10: 20 mg via INTRAVENOUS
  Filled 2021-06-10: qty 20

## 2021-06-10 MED ORDER — HEPARIN SOD (PORK) LOCK FLUSH 100 UNIT/ML IV SOLN
500.0000 [IU] | Freq: Once | INTRAVENOUS | Status: AC | PRN
  Administered 2021-06-10: 500 [IU]
  Filled 2021-06-10: qty 5

## 2021-06-10 MED ORDER — SODIUM CHLORIDE 0.9 % IV SOLN
10.0000 mg | Freq: Once | INTRAVENOUS | Status: AC
  Administered 2021-06-10: 10 mg via INTRAVENOUS
  Filled 2021-06-10: qty 10

## 2021-06-10 MED ORDER — DIPHENHYDRAMINE HCL 50 MG/ML IJ SOLN
50.0000 mg | Freq: Once | INTRAMUSCULAR | Status: AC
  Administered 2021-06-10: 50 mg via INTRAVENOUS
  Filled 2021-06-10: qty 1

## 2021-06-10 MED ORDER — HEPARIN SOD (PORK) LOCK FLUSH 100 UNIT/ML IV SOLN
INTRAVENOUS | Status: AC
  Filled 2021-06-10: qty 5

## 2021-06-10 MED ORDER — PALONOSETRON HCL INJECTION 0.25 MG/5ML
0.2500 mg | Freq: Once | INTRAVENOUS | Status: AC
  Administered 2021-06-10: 0.25 mg via INTRAVENOUS
  Filled 2021-06-10: qty 5

## 2021-06-10 MED ORDER — ACETAMINOPHEN 325 MG PO TABS
650.0000 mg | ORAL_TABLET | Freq: Once | ORAL | Status: AC
Start: 2021-06-10 — End: 2021-06-10
  Administered 2021-06-10: 650 mg via ORAL
  Filled 2021-06-10: qty 2

## 2021-06-10 MED ORDER — SODIUM CHLORIDE 0.9 % IV SOLN
Freq: Once | INTRAVENOUS | Status: AC
  Filled 2021-06-10: qty 250

## 2021-06-10 MED ORDER — SODIUM CHLORIDE 0.9 % IV SOLN
90.0000 mg/m2 | Freq: Once | INTRAVENOUS | Status: AC
  Administered 2021-06-10: 225 mg via INTRAVENOUS
  Filled 2021-06-10: qty 9

## 2021-06-10 NOTE — Progress Notes (Signed)
Pt her for split dose of Rituxan over 3 days- has history of reactions to Rituxan.  Pt states he has taken premeds that consist of : Prednisone 50 mg, Singulair 10 mg, Benadryl 10 mg, Tylenol 650 mg and Pepcid 20 mg bid for past 2 days per Dr Janese Banks orders. Pt will get extensive premeds in infusion clinic prior to rituxan x 3 days. Per Dr Janese Banks- no at home oral premeds day of infusion- pt to restart oral pre-meds at home on Friday x 3 days. Pt aware and verbalizes understanding

## 2021-06-10 NOTE — Progress Notes (Signed)
Patient has had previous reactions to rituxan. Per MD will split dose over 3 days. Slow infusion each day. Premedicate extensively.

## 2021-06-10 NOTE — Patient Instructions (Signed)
[facility] [place] [facility]  Discharge Instructions: Thank you for choosing [facility] to provide your oncology and hematology care.  If you have a lab appointment with the Cancer Center, please go directly to the Cancer Center and check in at the registration area.  Wear comfortable clothing and clothing appropriate for easy access to any Portacath or PICC line.   We strive to give you quality time with your provider. You may need to reschedule your appointment if you arrive late (15 or more minutes).  Arriving late affects you and other patients whose appointments are after yours.  Also, if you miss three or more appointments without notifying the office, you may be dismissed from the clinic at the provider's discretion.      For prescription refill requests, have your pharmacy contact our office and allow 72 hours for refills to be completed.    Today you received the following chemotherapy and/or immunotherapy agents       To help prevent nausea and vomiting after your treatment, we encourage you to take your nausea medication as directed.  BELOW ARE SYMPTOMS THAT SHOULD BE REPORTED IMMEDIATELY: *FEVER GREATER THAN 100.4 F (38 C) OR HIGHER *CHILLS OR SWEATING *NAUSEA AND VOMITING THAT IS NOT CONTROLLED WITH YOUR NAUSEA MEDICATION *UNUSUAL SHORTNESS OF BREATH *UNUSUAL BRUISING OR BLEEDING *URINARY PROBLEMS (pain or burning when urinating, or frequent urination) *BOWEL PROBLEMS (unusual diarrhea, constipation, pain near the anus) TENDERNESS IN MOUTH AND THROAT WITH OR WITHOUT PRESENCE OF ULCERS (sore throat, sores in mouth, or a toothache) UNUSUAL RASH, SWELLING OR PAIN  UNUSUAL VAGINAL DISCHARGE OR ITCHING   Items with * indicate a potential emergency and should be followed up as soon as possible or go to the Emergency Department if any problems should occur.  Please show the CHEMOTHERAPY ALERT CARD or IMMUNOTHERAPY ALERT CARD at check-in to the  Emergency Department and triage nurse.  Should you have questions after your visit or need to cancel or reschedule your appointment, please contact [facility] [place] [facility]  [phone] and follow the prompts.  Office hours are 8:00 a.m. to 4:30 p.m. Monday - Friday. Please note that voicemails left after 4:00 p.m. may not be returned until the following business day.  We are closed weekends and major holidays. You have access to a nurse at all times for urgent questions. Please call the main number to the clinic [phone] and follow the prompts.  For any non-urgent questions, you may also contact your provider using MyChart. We now offer e-Visits for anyone 18 and older to request care online for non-urgent symptoms. For details visit [email or web address].   Also download the MyChart app! Go to the app store, search "MyChart", open the app, select [facility], and log in with your MyChart username and password.  Due to Covid, a mask is required upon entering the hospital/clinic. If you do not have a mask, one will be given to you upon arrival. For doctor visits, patients may have 1 support person aged 18 or older with them. For treatment visits, patients cannot have anyone with them due to current Covid guidelines and our immunocompromised population.  

## 2021-06-11 ENCOUNTER — Ambulatory Visit: Payer: Medicare HMO | Admitting: Internal Medicine

## 2021-06-11 ENCOUNTER — Inpatient Hospital Stay: Payer: Medicare HMO

## 2021-06-11 VITALS — BP 136/90 | HR 114 | Temp 97.0°F | Resp 20

## 2021-06-11 DIAGNOSIS — C44611 Basal cell carcinoma of skin of unspecified upper limb, including shoulder: Secondary | ICD-10-CM | POA: Diagnosis not present

## 2021-06-11 DIAGNOSIS — R000 Tachycardia, unspecified: Secondary | ICD-10-CM | POA: Diagnosis not present

## 2021-06-11 DIAGNOSIS — I10 Essential (primary) hypertension: Secondary | ICD-10-CM | POA: Diagnosis not present

## 2021-06-11 DIAGNOSIS — E785 Hyperlipidemia, unspecified: Secondary | ICD-10-CM | POA: Diagnosis not present

## 2021-06-11 DIAGNOSIS — F17210 Nicotine dependence, cigarettes, uncomplicated: Secondary | ICD-10-CM | POA: Diagnosis not present

## 2021-06-11 DIAGNOSIS — Z5111 Encounter for antineoplastic chemotherapy: Secondary | ICD-10-CM | POA: Diagnosis not present

## 2021-06-11 DIAGNOSIS — D369 Benign neoplasm, unspecified site: Secondary | ICD-10-CM | POA: Diagnosis not present

## 2021-06-11 DIAGNOSIS — E291 Testicular hypofunction: Secondary | ICD-10-CM | POA: Diagnosis not present

## 2021-06-11 DIAGNOSIS — C8392 Non-follicular (diffuse) lymphoma, unspecified, intrathoracic lymph nodes: Secondary | ICD-10-CM | POA: Diagnosis not present

## 2021-06-11 DIAGNOSIS — C8202 Follicular lymphoma grade I, intrathoracic lymph nodes: Secondary | ICD-10-CM

## 2021-06-11 MED ORDER — HEPARIN SOD (PORK) LOCK FLUSH 100 UNIT/ML IV SOLN
500.0000 [IU] | Freq: Once | INTRAVENOUS | Status: AC | PRN
  Administered 2021-06-11: 500 [IU]
  Filled 2021-06-11: qty 5

## 2021-06-11 MED ORDER — FAMOTIDINE 20 MG IN NS 100 ML IVPB
20.0000 mg | Freq: Once | INTRAVENOUS | Status: AC
  Administered 2021-06-11: 20 mg via INTRAVENOUS
  Filled 2021-06-11: qty 20

## 2021-06-11 MED ORDER — PALONOSETRON HCL INJECTION 0.25 MG/5ML
0.2500 mg | Freq: Once | INTRAVENOUS | Status: AC
  Administered 2021-06-11: 0.25 mg via INTRAVENOUS
  Filled 2021-06-11: qty 5

## 2021-06-11 MED ORDER — SODIUM CHLORIDE 0.9 % IV SOLN
Freq: Once | INTRAVENOUS | Status: AC
  Filled 2021-06-11: qty 250

## 2021-06-11 MED ORDER — SODIUM CHLORIDE 0.9 % IV SOLN
300.0000 mg | Freq: Once | INTRAVENOUS | Status: AC
  Administered 2021-06-11: 300 mg via INTRAVENOUS
  Filled 2021-06-11: qty 30

## 2021-06-11 MED ORDER — MONTELUKAST SODIUM 10 MG PO TABS
10.0000 mg | ORAL_TABLET | Freq: Once | ORAL | Status: AC
  Administered 2021-06-11: 10 mg via ORAL
  Filled 2021-06-11: qty 1

## 2021-06-11 MED ORDER — SODIUM CHLORIDE 0.9 % IV SOLN
10.0000 mg | Freq: Once | INTRAVENOUS | Status: AC
  Administered 2021-06-11: 10 mg via INTRAVENOUS
  Filled 2021-06-11: qty 10

## 2021-06-11 MED ORDER — ACETAMINOPHEN 325 MG PO TABS
650.0000 mg | ORAL_TABLET | Freq: Once | ORAL | Status: AC
  Administered 2021-06-11: 650 mg via ORAL
  Filled 2021-06-11: qty 2

## 2021-06-11 MED ORDER — DIPHENHYDRAMINE HCL 50 MG/ML IJ SOLN
50.0000 mg | Freq: Once | INTRAMUSCULAR | Status: AC
Start: 2021-06-11 — End: 2021-06-11
  Administered 2021-06-11: 50 mg via INTRAVENOUS
  Filled 2021-06-11: qty 1

## 2021-06-11 MED ORDER — SODIUM CHLORIDE 0.9 % IV SOLN
90.0000 mg/m2 | Freq: Once | INTRAVENOUS | Status: AC
  Administered 2021-06-11: 225 mg via INTRAVENOUS
  Filled 2021-06-11: qty 9

## 2021-06-11 NOTE — Patient Instructions (Addendum)
[facility]  Discharge Instructions: Thank you for choosing [facility] to provide your oncology and hematology care.  If you have a lab appointment with the [facility], please go directly to the [facility] and check in at the registration area.  Wear comfortable clothing and clothing appropriate for easy access to any Portacath or PICC line.   We strive to give you quality time with your provider. You may need to reschedule your appointment if you arrive late (15 or more minutes).  Arriving late affects you and other patients whose appointments are after yours.  Also, if you miss three or more appointments without notifying the office, you may be dismissed from the clinic at the provider's discretion.      For prescription refill requests, have your pharmacy contact our office and allow 72 hours for refills to be completed.    Today you received the following chemotherapy and/or immunotherapy agents: Ruxience      To help prevent nausea and vomiting after your treatment, we encourage you to take your nausea medication as directed.  BELOW ARE SYMPTOMS THAT SHOULD BE REPORTED IMMEDIATELY: *FEVER GREATER THAN 100.4 F (38 C) OR HIGHER *CHILLS OR SWEATING *NAUSEA AND VOMITING THAT IS NOT CONTROLLED WITH YOUR NAUSEA MEDICATION *UNUSUAL SHORTNESS OF BREATH *UNUSUAL BRUISING OR BLEEDING *URINARY PROBLEMS (pain or burning when urinating, or frequent urination) *BOWEL PROBLEMS (unusual diarrhea, constipation, pain near the anus) TENDERNESS IN MOUTH AND THROAT WITH OR WITHOUT PRESENCE OF ULCERS (sore throat, sores in mouth, or a toothache) UNUSUAL RASH, SWELLING OR PAIN  UNUSUAL VAGINAL DISCHARGE OR ITCHING   Items with * indicate a potential emergency and should be followed up as soon as possible or go to the Emergency Department if any problems should occur.  Please show the CHEMOTHERAPY ALERT CARD or IMMUNOTHERAPY ALERT CARD at check-in  to the Emergency Department and triage nurse.  Should you have questions after your visit or need to cancel or reschedule your appointment, please contact [facility]  [phone] and follow the prompts.  Office hours are 8:00 a.m. to 4:30 p.m. Monday - Friday. Please note that voicemails left after 4:00 p.m. may not be returned until the following business day.  We are closed weekends and major holidays. You have access to a nurse at all times for urgent questions. Please call the main number to the clinic [phone] and follow the prompts.  For any non-urgent questions, you may also contact your provider using MyChart. We now offer e-Visits for anyone 29 and older to request care online for non-urgent symptoms. For details visit [email or web address].   Also download the MyChart app! Go to the app store, search "MyChart", open the app, select [facility], and log in with your MyChart username and password.  Due to Covid, a mask is required upon entering the hospital/clinic. If you do not have a mask, one will be given to you upon arrival. For doctor visits, patients may have 1 support person aged 17 or older with them. For treatment visits, patients cannot have anyone with them due to current Covid guidelines and our immunocompromised population. Rituximab Injection What is this medication? RITUXIMAB (ri TUX i mab) is a monoclonal antibody. It is used to treat certain types of cancer like non-Hodgkin lymphoma and chronic lymphocytic leukemia. It is also used to treat rheumatoid arthritis, granulomatosis with polyangiitis,microscopic polyangiitis, and pemphigus vulgaris. This medicine may be used for other purposes; ask your health  care provider orpharmacist if you have questions. COMMON BRAND NAME(S): RIABNI, Rituxan, RUXIENCE What should I tell my care team before I take this medication? They need to know if you have any of these conditions: chest  pain heart disease infection especially a viral infection such as chickenpox, cold sores, hepatitis B, or herpes immune system problems irregular heartbeat or rhythm kidney disease low blood counts (white cells, platelets, or red cells) lung disease recent or upcoming vaccine an unusual or allergic reaction to rituximab, other medicines, foods, dyes, or preservatives pregnant or trying to get pregnant breast-feeding How should I use this medication? This medicine is injected into a vein. It is given by a health care provider ina hospital or clinic setting. A special MedGuide will be given to you before each treatment. Be sure to readthis information carefully each time. Talk to your health care provider about the use of this medicine in children. While this drug may be prescribed for children as young as 6 months forselected conditions, precautions do apply. Overdosage: If you think you have taken too much of this medicine contact apoison control center or emergency room at once. NOTE: This medicine is only for you. Do not share this medicine with others. What if I miss a dose? Keep appointments for follow-up doses. It is important not to miss your dose.Call your health care provider if you are unable to keep an appointment. What may interact with this medication? Do not take this medicine with any of the following medicines: live vaccines This medicine may also interact with the following medicines: cisplatin This list may not describe all possible interactions. Give your health care provider a list of all the medicines, herbs, non-prescription drugs, or dietary supplements you use. Also tell them if you smoke, drink alcohol, or use illegaldrugs. Some items may interact with your medicine. What should I watch for while using this medication? Your condition will be monitored carefully while you are receiving thismedicine. You may need blood work done while you are taking this  medicine. This medicine can cause serious infusion reactions. To reduce the risk your health care provider may give you other medicines to take before receiving thisone. Be sure to follow the directions from your health care provider. This medicine may increase your risk of getting an infection. Call your health care provider for advice if you get a fever, chills, sore throat, or other symptoms of a cold or flu. Do not treat yourself. Try to avoid being aroundpeople who are sick. Call your health care provider if you are around anyone with measles,chickenpox, or if you develop sores or blisters that do not heal properly. Avoid taking medicines that contain aspirin, acetaminophen, ibuprofen, naproxen, or ketoprofen unless instructed by your health care provider. Thesemedicines may hide a fever. This medicine may cause serious skin reactions. They can happen weeks to months after starting the medicine. Contact your health care provider right away if you notice fevers or flu-like symptoms with a rash. The rash may be red or purple and then turn into blisters or peeling of the skin. Or, you might notice a red rash with swelling of the face, lips or lymph nodes in your neck or underyour arms. In some patients, this medicine may cause a serious brain infection that may cause death. If you have any problems seeing, thinking, speaking, walking, or standing, tell your healthcare professional right away. If you cannot reachyour healthcare professional, urgently seek other source of medical care. Do not become pregnant while  taking this medicine or for at least 12 months after stopping it. Women should inform their health care provider if they wish to become pregnant or think they might be pregnant. There is potential for serious harm to an unborn child. Talk to your health care provider for more information. Women should use a reliable form of birth control while taking this medicine and for 12 months after stopping  it. Do not breast-feed whiletaking this medicine or for at least 6 months after stopping it. What side effects may I notice from receiving this medication? Side effects that you should report to your health care provider as soon aspossible: allergic reactions (skin rash, itching or hives; swelling of the face, lips, or tongue) diarrhea edema (sudden weight gain; swelling of the ankles, feet, hands or other unusual swelling; trouble breathing) fast, irregular heartbeat heart attack (trouble breathing; pain or tightness in the chest, neck, back or arms; unusually weak or tired) infection (fever, chills, cough, sore throat, pain or trouble passing urine) kidney injury (trouble passing urine or change in the amount of urine) liver injury (dark yellow or brown urine; general ill feeling or flu-like symptoms; loss of appetite, right upper belly pain; unusually weak or tired, yellowing of the eyes or skin) low blood pressure (dizziness; feeling faint or lightheaded, falls; unusually weak or tired) low red blood cell counts (trouble breathing; feeling faint; lightheaded, falls; unusually weak or tired) mouth sores redness, blistering, peeling, or loosening of the skin, including inside the mouth stomach pain unusual bruising or bleeding wheezing (trouble breathing with loud or whistling sounds) vomiting Side effects that usually do not require medical attention (report to yourhealth care provider if they continue or are bothersome): headache joint pain muscle cramps, pain nausea This list may not describe all possible side effects. Call your doctor for medical advice about side effects. You may report side effects to FDA at1-800-FDA-1088. Where should I keep my medication? This medicine is given in a hospital or clinic. It will not be stored at home. NOTE: This sheet is a summary. It may not cover all possible information. If you have questions about this medicine, talk to your doctor, pharmacist,  orhealth care provider.  2022 Elsevier/Gold Standard (2020-11-07 15:47:26) Bendamustine Injection What is this medication? BENDAMUSTINE (BEN da MUS teen) is a chemotherapy drug. It is used to treatchronic lymphocytic leukemia and non-Hodgkin lymphoma. This medicine may be used for other purposes; ask your health care provider orpharmacist if you have questions. COMMON BRAND NAME(S): Kristine Royal, Treanda What should I tell my care team before I take this medication? They need to know if you have any of these conditions: infection (especially a virus infection such as chickenpox, cold sores, or herpes) kidney disease liver disease an unusual or allergic reaction to bendamustine, mannitol, other medicines, foods, dyes, or preservatives pregnant or trying to get pregnant breast-feeding How should I use this medication? This medicine is for infusion into a vein. It is given by a health careprofessional in a hospital or clinic setting. Talk to your pediatrician regarding the use of this medicine in children.Special care may be needed. Overdosage: If you think you have taken too much of this medicine contact apoison control center or emergency room at once. NOTE: This medicine is only for you. Do not share this medicine with others. What if I miss a dose? It is important not to miss your dose. Call your doctor or health careprofessional if you are unable to keep an appointment. What may interact  with this medication? Do not take this medicine with any of the following medications: clozapine This medicine may also interact with the following medications: atazanavir cimetidine ciprofloxacin enoxacin fluvoxamine medicines for seizures like carbamazepine and phenobarbital mexiletine rifampin tacrine thiabendazole zileuton This list may not describe all possible interactions. Give your health care provider a list of all the medicines, herbs, non-prescription drugs, or dietary  supplements you use. Also tell them if you smoke, drink alcohol, or use illegaldrugs. Some items may interact with your medicine. What should I watch for while using this medication? This drug may make you feel generally unwell. This is not uncommon, as chemotherapy can affect healthy cells as well as cancer cells. Report any side effects. Continue your course of treatment even though you feel ill unless yourdoctor tells you to stop. You may need blood work done while you are taking this medicine. Call your doctor or healthcare provider for advice if you get a fever, chills or sore throat, or other symptoms of a cold or flu. Do not treat yourself. This drug decreases your body's ability to fight infections. Try to avoid beingaround people who are sick. This medicine may cause serious skin reactions. They can happen weeks to months after starting the medicine. Contact your healthcare provider right away if you notice fevers or flu-like symptoms with a rash. The rash may be red or purple and then turn into blisters or peeling of the skin. Or, you might notice a red rash with swelling of the face, lips or lymph nodes in your neck or under yourarms. In some patients, this medicine may cause a serious brain infection that may cause death. If you have any problems seeing, thinking, speaking, walking, or standing, tell your health care provider right away. If you cannot reach yourhealth care provider, urgently seek other source of medical care. This medicine may increase your risk to bruise or bleed. Call your doctor orhealthcare provider if you notice any unusual bleeding. Talk to your doctor about your risk of cancer. You may be more at risk forcertain types of cancers if you take this medicine. This medicine may increase your risk of skin cancer. Check your skin for changes to moles or for new growths while taking this medicine. Call yourhealth care provider if you notice any of these skin changes. Do not  become pregnant while taking this medicine or for at least 6 months after stopping it. Women should inform their doctor if they wish to become pregnant or think they might be pregnant. Men should not father a child while taking this medicine and for at least 3 months after stopping it. There is a potential for serious side effects to an unborn child. Talk to your healthcare provider or pharmacist for more information. Do not breast-feed an infant whiletaking this medicine or for at least 1 week after stopping it. This medicine may make it more difficult to father a child. You should talk with your doctor or healthcare provider if you are concerned about yourfertility. What side effects may I notice from receiving this medication? Side effects that you should report to your doctor or health care professionalas soon as possible: allergic reactions like skin rash, itching or hives, swelling of the face, lips, or tongue low blood counts - this medicine may decrease the number of white blood cells, red blood cells and platelets. You may be at increased risk for infections and bleeding. rash, fever, and swollen lymph nodes redness, blistering, peeling, or loosening of the skin,  including inside the mouth signs of infection like fever or chills, cough, sore throat, pain or difficulty passing urine signs of decreased platelets or bleeding like bruising, pinpoint red spots on the skin, black, tarry stools, blood in the urine signs of decreased red blood cells like being unusually weak or tired, fainting spells, lightheadedness signs and symptoms of kidney injury like trouble passing urine or change in the amount of urine signs and symptoms of liver injury like dark yellow or brown urine; general ill feeling or flu-like symptoms; light-colored stools; loss of appetite; nausea; right upper belly pain; unusually weak or tired; yellowing of the eyes or skin Side effects that usually do not require medical attention  (report to yourdoctor or health care professional if they continue or are bothersome): constipation decreased appetite diarrhea headache mouth sores nausea, vomiting tiredness This list may not describe all possible side effects. Call your doctor for medical advice about side effects. You may report side effects to FDA at1-800-FDA-1088. Where should I keep my medication? This drug is given in a hospital or clinic and will not be stored at home. NOTE: This sheet is a summary. It may not cover all possible information. If you have questions about this medicine, talk to your doctor, pharmacist, orhealth care provider.  2022 Elsevier/Gold Standard (2020-05-13 12:11:43)

## 2021-06-12 ENCOUNTER — Telehealth: Payer: Self-pay | Admitting: *Deleted

## 2021-06-12 ENCOUNTER — Other Ambulatory Visit: Payer: Self-pay

## 2021-06-12 ENCOUNTER — Inpatient Hospital Stay: Payer: Medicare HMO

## 2021-06-12 VITALS — BP 154/104 | HR 106 | Temp 96.8°F

## 2021-06-12 DIAGNOSIS — F17210 Nicotine dependence, cigarettes, uncomplicated: Secondary | ICD-10-CM | POA: Diagnosis not present

## 2021-06-12 DIAGNOSIS — I10 Essential (primary) hypertension: Secondary | ICD-10-CM | POA: Diagnosis not present

## 2021-06-12 DIAGNOSIS — E785 Hyperlipidemia, unspecified: Secondary | ICD-10-CM | POA: Diagnosis not present

## 2021-06-12 DIAGNOSIS — E291 Testicular hypofunction: Secondary | ICD-10-CM | POA: Diagnosis not present

## 2021-06-12 DIAGNOSIS — D369 Benign neoplasm, unspecified site: Secondary | ICD-10-CM | POA: Diagnosis not present

## 2021-06-12 DIAGNOSIS — C8202 Follicular lymphoma grade I, intrathoracic lymph nodes: Secondary | ICD-10-CM

## 2021-06-12 DIAGNOSIS — C44611 Basal cell carcinoma of skin of unspecified upper limb, including shoulder: Secondary | ICD-10-CM | POA: Diagnosis not present

## 2021-06-12 DIAGNOSIS — Z5111 Encounter for antineoplastic chemotherapy: Secondary | ICD-10-CM | POA: Diagnosis not present

## 2021-06-12 DIAGNOSIS — R000 Tachycardia, unspecified: Secondary | ICD-10-CM | POA: Diagnosis not present

## 2021-06-12 DIAGNOSIS — C8392 Non-follicular (diffuse) lymphoma, unspecified, intrathoracic lymph nodes: Secondary | ICD-10-CM | POA: Diagnosis not present

## 2021-06-12 MED ORDER — ACETAMINOPHEN 325 MG PO TABS
650.0000 mg | ORAL_TABLET | Freq: Once | ORAL | Status: AC
  Administered 2021-06-12: 650 mg via ORAL
  Filled 2021-06-12: qty 2

## 2021-06-12 MED ORDER — SODIUM CHLORIDE 0.9 % IV SOLN
Freq: Once | INTRAVENOUS | Status: AC
  Filled 2021-06-12: qty 250

## 2021-06-12 MED ORDER — MONTELUKAST SODIUM 10 MG PO TABS
10.0000 mg | ORAL_TABLET | Freq: Once | ORAL | Status: AC
  Administered 2021-06-12: 10 mg via ORAL
  Filled 2021-06-12: qty 1

## 2021-06-12 MED ORDER — HEPARIN SOD (PORK) LOCK FLUSH 100 UNIT/ML IV SOLN
500.0000 [IU] | Freq: Once | INTRAVENOUS | Status: AC | PRN
  Administered 2021-06-12: 500 [IU]
  Filled 2021-06-12: qty 5

## 2021-06-12 MED ORDER — SODIUM CHLORIDE 0.9 % IV SOLN
10.0000 mg | Freq: Once | INTRAVENOUS | Status: AC
  Administered 2021-06-12: 10 mg via INTRAVENOUS
  Filled 2021-06-12: qty 10

## 2021-06-12 MED ORDER — DIPHENHYDRAMINE HCL 50 MG/ML IJ SOLN
50.0000 mg | Freq: Once | INTRAMUSCULAR | Status: AC
  Administered 2021-06-12: 50 mg via INTRAVENOUS
  Filled 2021-06-12: qty 1

## 2021-06-12 MED ORDER — SODIUM CHLORIDE 0.9 % IV SOLN
300.0000 mg | Freq: Once | INTRAVENOUS | Status: AC
  Administered 2021-06-12: 300 mg via INTRAVENOUS
  Filled 2021-06-12: qty 30

## 2021-06-12 MED ORDER — PALONOSETRON HCL INJECTION 0.25 MG/5ML
0.2500 mg | Freq: Once | INTRAVENOUS | Status: AC
  Administered 2021-06-12: 0.25 mg via INTRAVENOUS
  Filled 2021-06-12: qty 5

## 2021-06-12 MED ORDER — FAMOTIDINE 20 MG IN NS 100 ML IVPB
20.0000 mg | Freq: Once | INTRAVENOUS | Status: AC
  Administered 2021-06-12: 20 mg via INTRAVENOUS
  Filled 2021-06-12: qty 20

## 2021-06-12 MED ORDER — SODIUM CHLORIDE 0.9% FLUSH
10.0000 mL | INTRAVENOUS | Status: DC | PRN
  Filled 2021-06-12: qty 10

## 2021-06-12 MED ORDER — HEPARIN SOD (PORK) LOCK FLUSH 100 UNIT/ML IV SOLN
INTRAVENOUS | Status: AC
  Filled 2021-06-12: qty 5

## 2021-06-12 NOTE — Progress Notes (Signed)
Vitals reviewed with MD and treatment team. Per MD to proceed with treatment today. Treatment team updated.   Russell Simpson [employer]

## 2021-06-12 NOTE — Progress Notes (Signed)
Pt completed the Ruxience infusion without any complications. All pre medications given as ordered. At the completion of his infusion, pt stated that he has had three episodes of diarrhea since the start of his treatment today. MD and treatment team notified, per MD- pt may take over the counter Imodium if diarrhea continues or worsen. Pt updated. RN educated pt on the importance of notifying the clinic if any complications occurs, diarrhea worsens, and when to seek emergency care. Pt verbalized understanding and all questions answered at this time. Pt stable for discharge.   Shantana Christon [employer]

## 2021-06-12 NOTE — Patient Instructions (Signed)
[facility]  Discharge Instructions: Thank you for choosing [facility] to provide your oncology and hematology care.  If you have a lab appointment with the [facility], please go directly to the [facility] and check in at the registration area.  Wear comfortable clothing and clothing appropriate for easy access to any Portacath or PICC line.   We strive to give you quality time with your provider. You may need to reschedule your appointment if you arrive late (15 or more minutes).  Arriving late affects you and other patients whose appointments are after yours.  Also, if you miss three or more appointments without notifying the office, you may be dismissed from the clinic at the provider's discretion.      For prescription refill requests, have your pharmacy contact our office and allow 72 hours for refills to be completed.    Today you received the following chemotherapy and/or immunotherapy agents Rituximab   To help prevent nausea and vomiting after your treatment, we encourage you to take your nausea medication as directed.  BELOW ARE SYMPTOMS THAT SHOULD BE REPORTED IMMEDIATELY: *FEVER GREATER THAN 100.4 F (38 C) OR HIGHER *CHILLS OR SWEATING *NAUSEA AND VOMITING THAT IS NOT CONTROLLED WITH YOUR NAUSEA MEDICATION *UNUSUAL SHORTNESS OF BREATH *UNUSUAL BRUISING OR BLEEDING *URINARY PROBLEMS (pain or burning when urinating, or frequent urination) *BOWEL PROBLEMS (unusual diarrhea, constipation, pain near the anus) TENDERNESS IN MOUTH AND THROAT WITH OR WITHOUT PRESENCE OF ULCERS (sore throat, sores in mouth, or a toothache) UNUSUAL RASH, SWELLING OR PAIN  UNUSUAL VAGINAL DISCHARGE OR ITCHING   Items with * indicate a potential emergency and should be followed up as soon as possible or go to the Emergency Department if any problems should occur.  Please show the CHEMOTHERAPY ALERT CARD or IMMUNOTHERAPY ALERT CARD at check-in to  the Emergency Department and triage nurse.  Should you have questions after your visit or need to cancel or reschedule your appointment, please contact [facility]  [phone] and follow the prompts.  Office hours are 8:00 a.m. to 4:30 p.m. Monday - Friday. Please note that voicemails left after 4:00 p.m. may not be returned until the following business day.  We are closed weekends and major holidays. You have access to a nurse at all times for urgent questions. Please call the main number to the clinic [phone] and follow the prompts.  For any non-urgent questions, you may also contact your provider using MyChart. We now offer e-Visits for anyone 62 and older to request care online for non-urgent symptoms. For details visit [email or web address].   Also download the MyChart app! Go to the app store, search "MyChart", open the app, select [facility], and log in with your MyChart username and password.  Due to Covid, a mask is required upon entering the hospital/clinic. If you do not have a mask, one will be given to you upon arrival. For doctor visits, patients may have 1 support person aged 33 or older with them. For treatment visits, patients cannot have anyone with them due to current Covid guidelines and our immunocompromised population.

## 2021-06-12 NOTE — Telephone Encounter (Signed)
Pt had 3 diarrhea stools while in chemo today. On the way out Lauren suggested to get imodium. Dr. Janese Banks wanted me to tell him to get some for home if the diarrhea cont. I called and got voicemail and left message: I called pt on cell phone and got his voicemail and said that if he is on the way home he could stop and get imodium over the counter and if he has another diarrhea stool take 2 tablets first time and every loose stool after that take 1 pill each time. call us back if he has questions

## 2021-06-15 ENCOUNTER — Telehealth: Payer: Self-pay | Admitting: Dermatology

## 2021-06-15 NOTE — Telephone Encounter (Signed)
Patient had a lesion concerning for skin cancer at the last visit but needed to go to the hospital, so we planned to bring him back for biopsy. He missed the appointment. Can you please call and reschedule within the next month? Thank you!

## 2021-06-17 ENCOUNTER — Telehealth: Payer: Self-pay | Admitting: Oncology

## 2021-06-17 ENCOUNTER — Inpatient Hospital Stay: Payer: Medicare HMO

## 2021-06-17 ENCOUNTER — Other Ambulatory Visit: Payer: Self-pay | Admitting: *Deleted

## 2021-06-17 ENCOUNTER — Inpatient Hospital Stay: Payer: Medicare HMO | Admitting: Oncology

## 2021-06-17 ENCOUNTER — Telehealth: Payer: Self-pay

## 2021-06-17 DIAGNOSIS — E291 Testicular hypofunction: Secondary | ICD-10-CM

## 2021-06-17 NOTE — Telephone Encounter (Signed)
Left VM with patient to reschedule today's missed appt (pt hospitalized).

## 2021-06-17 NOTE — Telephone Encounter (Signed)
Thanks so much, Just called him and left a VM.

## 2021-06-17 NOTE — Telephone Encounter (Signed)
LVM for patient to call office to schedule appt for biopsy.Russell Simpson

## 2021-06-17 NOTE — Telephone Encounter (Signed)
Patients dad had a stroke and is currently at [facility], patient is requesting a call back to reschedule appointment.  Routing to team for follow up.

## 2021-06-18 ENCOUNTER — Other Ambulatory Visit: Payer: Self-pay | Admitting: Oncology

## 2021-06-18 ENCOUNTER — Telehealth: Payer: Self-pay | Admitting: Family Medicine

## 2021-06-18 DIAGNOSIS — I10 Essential (primary) hypertension: Secondary | ICD-10-CM

## 2021-06-18 MED ORDER — AMLODIPINE BESYLATE 5 MG PO TABS: 5.0000 mg | ORAL_TABLET | Freq: Every day | ORAL | 0 refills | Status: DC

## 2021-06-18 NOTE — Telephone Encounter (Signed)
[facility] faxed refill request for the following medications:  amLODipine (NORVASC) 5 MG tablet    Please advise.

## 2021-06-26 ENCOUNTER — Encounter: Payer: Self-pay | Admitting: Dermatology

## 2021-07-01 ENCOUNTER — Other Ambulatory Visit: Payer: Self-pay

## 2021-07-01 ENCOUNTER — Ambulatory Visit (INDEPENDENT_AMBULATORY_CARE_PROVIDER_SITE_OTHER): Payer: Medicare HMO | Admitting: Gastroenterology

## 2021-07-01 ENCOUNTER — Encounter: Payer: Self-pay | Admitting: Gastroenterology

## 2021-07-01 VITALS — BP 129/91 | HR 113 | Temp 98.3°F | Ht 72.4 in | Wt 221.0 lb

## 2021-07-01 DIAGNOSIS — R768 Other specified abnormal immunological findings in serum: Secondary | ICD-10-CM | POA: Diagnosis not present

## 2021-07-01 NOTE — Progress Notes (Signed)
[name] MD, MRCP(U.K) [address]  [place]  [place], [place] [address]  Main: [phone]  Fax: [phone]   Primary Care Physician: Margo Common, PA-C  Primary Gastroenterologist:  Dr. [name]    Chief complaint follow-up for hepatitis B core antibody positive while on Rituxan   HPI: Russell Simpson is a 60 y.o. male  Summary of history :  Initially referred and seen on 03/31/2021 for a positive hepatitis B core antibody in the setting of lymphoma requiring Rituxan.  On 03/10/2021 was noted to have positive hepatitis B core antibody with surface antigen negative and surface antibody strongly positive.CT abdomen on 02/26/2021 showed no evidence of portal hypertension   02/27/2021 platelet count is 91   He denies any prior tattoos, military service, blood transfusions, illegal drug use.  He does collect receiving vaccination while at school from the vaccination gun.     Interval history 03/31/2021-07/01/2021 06/03/2021 alkaline phosphatase 137 but transaminases normal, hemoglobin 10.6 g He is doing well.  3 starting chemotherapy next week.  No new complaints.  He is taking his tenofovir regularly.  Current Outpatient Medications  Medication Sig Dispense Refill   acetaminophen (TYLENOL) 325 MG tablet Take 2 tablets (650 mg total) by mouth daily. for 5 days starting 2 days prior to treatment 30 tablet 0   acyclovir (ZOVIRAX) 400 MG tablet TAKE 1 TABLET BY MOUTH TWICE DAILY 60 tablet 2   allopurinol (ZYLOPRIM) 300 MG tablet Take 1 tablet (300 mg total) by mouth daily. 30 tablet 3   amLODipine (NORVASC) 5 MG tablet Take 1 tablet (5 mg total) by mouth daily. Please schedule an office visit before anymore refills. 30 tablet 0   atorvastatin (LIPITOR) 40 MG tablet Take 1 tablet (40 mg total) by mouth daily. 90 tablet 4   buPROPion (WELLBUTRIN XL) 150 MG 24 hr tablet Take 1 tablet by mouth every morning.     clonazePAM (KLONOPIN) 0.5 MG tablet Take 0.5 mg by mouth daily.      dexamethasone (DECADRON) 4 MG tablet Take 2 tablets (8 mg total) by mouth daily. Start the day after bendamustine chemotherapy for 2 days. Take with food. 30 tablet 1   DULoxetine (CYMBALTA) 60 MG capsule TAKE 1 CAPSULE EVERY DAY 90 capsule 0   famotidine (PEPCID) 20 MG tablet TAKE 1 TABLET TWICE DAILY 180 tablet 0   finasteride (PROSCAR) 5 MG tablet TAKE 1 TABLET BY MOUTH ONCE DAILY 90 tablet 2   HYDROcodone bit-homatropine (HYCODAN) 5-1.5 MG/5ML syrup Take 5 mLs by mouth every 4 (four) hours as needed for cough. 120 mL 0   lansoprazole (PREVACID) 30 MG capsule TAKE 1 CAPSULE BY MOUTH ONCE DAILY AT NOON 30 capsule 2   lidocaine-prilocaine (EMLA) cream Apply to affected area once 30 g 3   loratadine (CLARITIN) 10 MG tablet Take 10 mg by mouth daily.      metoprolol succinate (TOPROL-XL) 25 MG 24 hr tablet Take 1 tablet (25 mg total) by mouth daily. 90 tablet 1   mirtazapine (REMERON) 15 MG tablet Take 15 mg by mouth at bedtime.     modafinil (PROVIGIL) 200 MG tablet Take 200 mg by mouth daily.     mupirocin ointment (BACTROBAN) 2 % Apply 1 application topically 2 (two) times daily. 22 g 0   naltrexone (DEPADE) 50 MG tablet      OLANZapine (ZYPREXA) 7.5 MG tablet Take 7.5 mg by mouth at bedtime.     ondansetron (ZOFRAN) 8 MG tablet  Take 1 tablet (8 mg total) by mouth 2 (two) times daily as needed for refractory nausea / vomiting. Start on day 2 after bendamustine chemo. 30 tablet 1   predniSONE (DELTASONE) 50 MG tablet Take 1 tablet daily for 5 days starting 2 days prior to treatment 30 tablet 0   prochlorperazine (COMPAZINE) 10 MG tablet TAKE 1 TABLET BY MOUTH EVERY 6 HOURS AS NEEDED NAUSEA AND VOMITING 30 tablet 1   tacrolimus (PROTOPIC) 0.1 % ointment Apply 1 application topically 2 (two) times daily.     tadalafil (CIALIS) 20 MG tablet 1 tab 1 hour prior to intercourse 10 tablet 0   testosterone cypionate (DEPOTESTOSTERONE CYPIONATE) 200 MG/ML injection Inject 1 mL (200 mg total) into the  muscle every 14 (fourteen) days. 10 mL 0   traMADol (ULTRAM) 50 MG tablet Take 50 mg by mouth every 6 (six) hours as needed.     triamcinolone (KENALOG) 0.1 % Apply to affected areas 1-2 times a day until rash improved. Avoid face, groin, underarms. 80 g 1   diphenhydrAMINE (BENADRYL) 25 mg capsule Take 1 capsule (25 mg total) by mouth daily for 1 dose. Take 1 capsule daily for 5 days starting 2 days prior to treatment (Patient not taking: Reported on 07/01/2021) 30 capsule 0   No current facility-administered medications for this visit.   Facility-Administered Medications Ordered in Other Visits  Medication Dose Route Frequency Provider Last Rate Last Admin   heparin lock flush 100 unit/mL  500 Units Intravenous Once Sindy Guadeloupe, MD        Allergies as of 07/01/2021 - Review Complete 07/01/2021  Allergen Reaction Noted   Penicillins Anaphylaxis 09/03/2015   Tetanus toxoids Swelling 09/03/2015   Lisinopril Cough 10/15/2015   Losartan  10/15/2015   Tetanus toxoid     Codeine Nausea Only and Nausea And Vomiting 12/05/2020   Doxycycline Rash 01/07/2019   Ruxience [rituximab-pvvr] Rash 05/08/2021    ROS:  General: Negative for anorexia, weight loss, fever, chills, fatigue, weakness. ENT: Negative for hoarseness, difficulty swallowing , nasal congestion. CV: Negative for chest pain, angina, palpitations, dyspnea on exertion, peripheral edema.  Respiratory: Negative for dyspnea at rest, dyspnea on exertion, cough, sputum, wheezing.  GI: See history of present illness. GU:  Negative for dysuria, hematuria, urinary incontinence, urinary frequency, nocturnal urination.  Endo: Negative for unusual weight change.    Physical Examination:   BP (!) 129/91   Pulse (!) 113   Temp 98.3 F (36.8 C) (Oral)   Ht 6' 0.4" (1.839 m)   Wt 221 lb (100.2 kg)   BMI 29.64 kg/m   General: Well-nourished, well-developed in no acute distress.  Eyes: No icterus. Conjunctivae pink. Mouth:  Oropharyngeal mucosa moist and pink , no lesions erythema or exudate. Neuro: Alert and oriented x 3.  Grossly intact. Skin: Warm and dry, no jaundice.   Psych: Alert and cooperative, normal mood and affect.   Imaging Studies: No results found.  Assessment and Plan:   Russell Simpson is a 60 y.o. y/o male here to follow-up for positive hepatitis B core antibody with a negative surface antigen in the setting of treatment of a lymphoma with Rituxan.  Commenced on tenofovir prior to commencing his chemotherapy   Plan 1.  Check hepatitis C antibody to rule out infection, hep and HIV antibody which was ordered previously but was not performed, will also check hepatitis B viral load and E antigen status and i surface antigen 2.  Continue on tenofovir for  for  at least 12 months after stopping anti-CD20 agents since there is a lag in the recovery of B cell function among such patients.  Dr [name]  MD,MRCP [facility]) Follow up in 12 months

## 2021-07-02 DIAGNOSIS — F3162 Bipolar disorder, current episode mixed, moderate: Secondary | ICD-10-CM | POA: Diagnosis not present

## 2021-07-02 DIAGNOSIS — F410 Panic disorder [episodic paroxysmal anxiety] without agoraphobia: Secondary | ICD-10-CM | POA: Diagnosis not present

## 2021-07-02 DIAGNOSIS — F1010 Alcohol abuse, uncomplicated: Secondary | ICD-10-CM | POA: Diagnosis not present

## 2021-07-02 LAB — HEPATIC FUNCTION PANEL
ALT: 18 IU/L (ref 0–44)
AST: 32 IU/L (ref 0–40)
Albumin: 4.3 g/dL (ref 3.8–4.9)
Alkaline Phosphatase: 146 IU/L — ABNORMAL HIGH (ref 44–121)
Bilirubin Total: 0.6 mg/dL (ref 0.0–1.2)
Bilirubin, Direct: 0.23 mg/dL (ref 0.00–0.40)
Total Protein: 5.8 g/dL — ABNORMAL LOW (ref 6.0–8.5)

## 2021-07-02 LAB — HEPATITIS B SURFACE ANTIGEN: Hepatitis B Surface Ag: NEGATIVE

## 2021-07-02 LAB — HEPATITIS B E ANTIGEN: Hep B E Ag: NEGATIVE

## 2021-07-02 LAB — HEPATITIS C ANTIBODY: Hep C Virus Ab: <0.1 s/co ratio (ref 0.0–0.9)

## 2021-07-02 LAB — HIV ANTIBODY (ROUTINE TESTING W REFLEX): HIV Screen 4th Generation wRfx: NONREACTIVE

## 2021-07-02 NOTE — Telephone Encounter (Signed)
Please call patient again. Thank you

## 2021-07-07 ENCOUNTER — Telehealth: Payer: Self-pay

## 2021-07-07 DIAGNOSIS — R768 Other specified abnormal immunological findings in serum: Secondary | ICD-10-CM

## 2021-07-07 DIAGNOSIS — Z79899 Other long term (current) drug therapy: Secondary | ICD-10-CM

## 2021-07-07 DIAGNOSIS — R748 Abnormal levels of other serum enzymes: Secondary | ICD-10-CM

## 2021-07-07 DIAGNOSIS — Z7960 Long term (current) use of unspecified immunomodulators and immunosuppressants: Secondary | ICD-10-CM

## 2021-07-07 NOTE — Telephone Encounter (Signed)
-----  Message from [name], MD sent at 07/07/2021  9:51 AM EDT ----- Herb Grays inform - alkaline phosphatase is mildly elevated, check PTH,CA,Vitamin D,GGT.Also get RUQ USG for elevated alk phos   Hep B viral load was ordered but not done- can we have it done please  C/c Dr Janese Banks (FYI)  Dr [name] MD,MRCP [facility]) Gastroenterology/Hepatology Pager: [phone]

## 2021-07-07 NOTE — Telephone Encounter (Signed)
Called patient and left him a detailed message. I will also send him a message through mychart letting him know what Dr. Vicente Males stated about his lab results and what else he is needing from the patient.

## 2021-07-08 ENCOUNTER — Inpatient Hospital Stay: Payer: Medicare HMO | Attending: Oncology

## 2021-07-08 ENCOUNTER — Encounter: Payer: Self-pay | Admitting: Oncology

## 2021-07-08 ENCOUNTER — Other Ambulatory Visit: Payer: Self-pay | Admitting: *Deleted

## 2021-07-08 ENCOUNTER — Inpatient Hospital Stay (HOSPITAL_BASED_OUTPATIENT_CLINIC_OR_DEPARTMENT_OTHER): Payer: Medicare HMO | Admitting: Oncology

## 2021-07-08 ENCOUNTER — Telehealth: Payer: Self-pay

## 2021-07-08 ENCOUNTER — Telehealth: Payer: Self-pay | Admitting: *Deleted

## 2021-07-08 ENCOUNTER — Other Ambulatory Visit: Payer: Self-pay

## 2021-07-08 VITALS — BP 112/90 | HR 129 | Temp 99.8°F | Wt 220.0 lb

## 2021-07-08 DIAGNOSIS — Z86011 Personal history of benign neoplasm of the brain: Secondary | ICD-10-CM | POA: Insufficient documentation

## 2021-07-08 DIAGNOSIS — D352 Benign neoplasm of pituitary gland: Secondary | ICD-10-CM | POA: Insufficient documentation

## 2021-07-08 DIAGNOSIS — F32A Depression, unspecified: Secondary | ICD-10-CM | POA: Insufficient documentation

## 2021-07-08 DIAGNOSIS — R000 Tachycardia, unspecified: Secondary | ICD-10-CM

## 2021-07-08 DIAGNOSIS — Z85828 Personal history of other malignant neoplasm of skin: Secondary | ICD-10-CM | POA: Insufficient documentation

## 2021-07-08 DIAGNOSIS — Z5181 Encounter for therapeutic drug level monitoring: Secondary | ICD-10-CM

## 2021-07-08 DIAGNOSIS — Z5111 Encounter for antineoplastic chemotherapy: Secondary | ICD-10-CM | POA: Insufficient documentation

## 2021-07-08 DIAGNOSIS — E785 Hyperlipidemia, unspecified: Secondary | ICD-10-CM | POA: Insufficient documentation

## 2021-07-08 DIAGNOSIS — Z79899 Other long term (current) drug therapy: Secondary | ICD-10-CM | POA: Insufficient documentation

## 2021-07-08 DIAGNOSIS — C8202 Follicular lymphoma grade I, intrathoracic lymph nodes: Secondary | ICD-10-CM

## 2021-07-08 DIAGNOSIS — C8208 Follicular lymphoma grade I, lymph nodes of multiple sites: Secondary | ICD-10-CM | POA: Diagnosis not present

## 2021-07-08 DIAGNOSIS — Z5189 Encounter for other specified aftercare: Secondary | ICD-10-CM | POA: Diagnosis not present

## 2021-07-08 DIAGNOSIS — I10 Essential (primary) hypertension: Secondary | ICD-10-CM | POA: Insufficient documentation

## 2021-07-08 DIAGNOSIS — G8929 Other chronic pain: Secondary | ICD-10-CM | POA: Diagnosis not present

## 2021-07-08 DIAGNOSIS — E291 Testicular hypofunction: Secondary | ICD-10-CM | POA: Diagnosis not present

## 2021-07-08 LAB — CBC WITH DIFFERENTIAL/PLATELET
Abs Immature Granulocytes: 0.31 10*3/uL — ABNORMAL HIGH (ref 0.00–0.07)
Basophils Absolute: 0 10*3/uL (ref 0.0–0.1)
Basophils Relative: 1 %
Eosinophils Absolute: 0.1 10*3/uL (ref 0.0–0.5)
Eosinophils Relative: 1 %
HCT: 38.9 % — ABNORMAL LOW (ref 39.0–52.0)
Hemoglobin: 14.2 g/dL (ref 13.0–17.0)
Immature Granulocytes: 6 %
Lymphocytes Relative: 44 %
Lymphs Abs: 2.2 10*3/uL (ref 0.7–4.0)
MCH: 32.7 pg (ref 26.0–34.0)
MCHC: 36.5 g/dL — ABNORMAL HIGH (ref 30.0–36.0)
MCV: 89.6 fL (ref 80.0–100.0)
Monocytes Absolute: 0.9 10*3/uL (ref 0.1–1.0)
Monocytes Relative: 18 %
Neutro Abs: 1.5 10*3/uL — ABNORMAL LOW (ref 1.7–7.7)
Neutrophils Relative %: 30 %
Platelets: 138 10*3/uL — ABNORMAL LOW (ref 150–400)
RBC: 4.34 MIL/uL (ref 4.22–5.81)
RDW: 14 % (ref 11.5–15.5)
Smear Review: NORMAL
WBC: 5 10*3/uL (ref 4.0–10.5)
nRBC: 0 % (ref 0.0–0.2)

## 2021-07-08 LAB — COMPREHENSIVE METABOLIC PANEL
ALT: 16 U/L (ref 0–44)
AST: 31 U/L (ref 15–41)
Albumin: 4 g/dL (ref 3.5–5.0)
Alkaline Phosphatase: 121 U/L (ref 38–126)
Anion gap: 12 (ref 5–15)
BUN: 11 mg/dL (ref 6–20)
CO2: 23 mmol/L (ref 22–32)
Calcium: 8.5 mg/dL — ABNORMAL LOW (ref 8.9–10.3)
Chloride: 97 mmol/L — ABNORMAL LOW (ref 98–111)
Creatinine, Ser: 0.86 mg/dL (ref 0.61–1.24)
GFR, Estimated: >60 mL/min (ref >60)
Glucose, Bld: 118 mg/dL — ABNORMAL HIGH (ref 70–99)
Potassium: 3.8 mmol/L (ref 3.5–5.1)
Sodium: 132 mmol/L — ABNORMAL LOW (ref 135–145)
Total Bilirubin: 1 mg/dL (ref 0.3–1.2)
Total Protein: 6.5 g/dL (ref 6.5–8.1)

## 2021-07-08 MED ORDER — PREDNISONE 50 MG PO TABS: ORAL_TABLET | ORAL | 0 refills | Status: DC

## 2021-07-08 NOTE — Telephone Encounter (Signed)
Wonderful, thank you

## 2021-07-08 NOTE — Telephone Encounter (Signed)
Copied from [place] [id number]. Topic: General - Other >> Jul 08, 2021  1:15 PM Pawlus, Brayton Layman A wrote: Reason for CRM: Antony Madura from the cancer center had some follow up questions for Dr Neomia Dear regarding treatment for the pt. Caller requested a call back.

## 2021-07-08 NOTE — Progress Notes (Signed)
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 [name]  He sustained a left anterior frontal sinus as well as supraorbital rim fracture on 11/21/2019 which was managed conservatively.  He then presented to the ER at [facility] with symptoms of right-sided chest wall pain this led to a CT PE which did not show any evidence of pulmonary embolism.  He was noted to have bilateral symmetric axillary adenopathy measuring up to 1.8 cm.  Scattered mediastinal adenopathy.  Right paratracheal node measuring up to 1.2 cm.  Prominent right costophrenic lymph node measuring up to 1 cm.  Findings are nonspecific but could be seen with lymphoma versus systemic inflammatory disease such as sarcoidosis.  Of note patient has had a prior CT scan for lung screening back in 2015 when he was not noted to have this adenopathy.    Repeat CT chest in August 2021 showed persistent mild nonbulky axillary and mediastinal adenopathy which had not changed significantly as compared to his prior CT in December 2020.  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   Repeat CT in March 2022 showed Progression and multistation adenopathy as well as significant splenomegaly  measuring 20.4 cm as compared to 14.5 cm on CT scan September 2021.  CT scan showed multistation adenopathy with SUVs ranging between 6-8.9.  Patient underwent another core biopsy of right inguinal lymph node which was consistent with follicular lymphoma grade 1-2.  Bone marrow biopsy also showed moderate involvement with non-Hodgkin's B-cell lymphoma.  Baseline hepatitis B testing showed core antibody positivity.  Seen by GI and started on tenofovir   Patient had symptoms of nausea and sweating during cycle 1 of Rituxan which resolved with additional premedications.  He developed symptoms of itching over neck chest back and bilateral eyes with second dose of Rituxan early and had to get more premedications and was not rechallenged on the same day.  Patient subsequently received cycle 3 of Bendamustine Rituxan chemotherapy after Rituxan was given as a split dose over 3 days  Interval history-patient did not come for a follow-up 1 to 2 weeks ago as his father was admitted to the hospital for symptoms of stroke.  He is technically due for cycle 4 of chemotherapy today but has not taken his premedications.  He is otherwise feeling well and denies any specific complaints at this time other than mild fatigue  ECOG PS- 1 Pain scale- 0   Review of systems- Review of Systems  Constitutional:  Positive for malaise/fatigue. Negative for chills, fever and weight loss.  HENT:  Negative for congestion, ear discharge and nosebleeds.   Eyes:  Negative for blurred vision.  Respiratory:  Negative for cough, hemoptysis, sputum production, shortness of breath and wheezing.   Cardiovascular:  Negative for chest pain, palpitations, orthopnea and claudication.  Gastrointestinal:  Negative for  abdominal pain, blood in stool, constipation, diarrhea, heartburn, melena, nausea and vomiting.  Genitourinary:  Negative for dysuria, flank pain, frequency, hematuria and urgency.  Musculoskeletal:  Negative for back pain, joint  pain and myalgias.  Skin:  Negative for rash.  Neurological:  Negative for dizziness, tingling, focal weakness, seizures, weakness and headaches.  Endo/Heme/Allergies:  Does not bruise/bleed easily.  Psychiatric/Behavioral:  Negative for depression and suicidal ideas. The patient does not have insomnia.      Allergies  Allergen Reactions   Penicillins Anaphylaxis    Tolerates cefdinir, cephalexin and amoxicillin/clavulonate so true PCN allergy unlikely   Tetanus Toxoids Swelling   Lisinopril Cough   Losartan      Other reaction(s): Muscle Pain     Tetanus Toxoid    Codeine Nausea Only and Nausea And Vomiting   Doxycycline Rash   Ruxience [Rituximab-Pvvr] Rash    05/06/21 pt w/ worsening rash during Ruxience infusion.  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[place])    Arthritis    Asthma    Basal cell carcinoma 01/28/2021   R upper arm, EDC 03/04/2021   Brain 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   Chronic pain    right arm   Depression    Environmental and seasonal allergies    Follicular lymphoma ([place])    Hypertension    Pneumonia    Sleep apnea    does not wear CPAP ; uses humidifier instead   Squamous cell carcinoma of skin 02/19/2021   L inferior mandible, treated with [facility]      Past Surgical History:  Procedure Laterality Date   ANTERIOR CERVICAL DECOMP/DISCECTOMY FUSION N/A 06/17/2016   Procedure: ANTERIOR CERVICAL DECOMPRESSION FUSION, CERVICAL 3-4, CERVICAL 4-5 WITH INSTRUMENTATION AND ALLOGRAFT;  Surgeon: Phylliss Bob, MD;  Location: [facility];  Service: Orthopedics;  Laterality: N/A;  ANTERIOR CERVICAL DECOMPRESSION FUSION, CERVICAL 3-4, CERVICAL 4-5 WITH INSTRUMENTATION AND ALLOGRAFT   BACK SURGERY     NECK SURGERY  2009   PORTA CATH INSERTION N/A 04/03/2021   Procedure: PORTA CATH INSERTION;  Surgeon: Algernon Huxley, MD;  Location: [facility];  Service: Cardiovascular;   Laterality: N/A;    Social History   Socioeconomic History   Marital status: Divorced    Spouse name: Not on file   Number of children: Not on file   Years of education: Not on file   Highest education level: Not on file  Occupational History   Not on file  Tobacco Use   Smoking status: Every Day    Types: Cigars   Smokeless tobacco: Never   Tobacco comments:    5-6 cigars a day  Vaping Use   Vaping Use: Former   Start date: 04/30/2017   Quit date: 11/30/2017  Substance and Sexual Activity   Alcohol use: Not Currently    Alcohol/week: 0.0 standard drinks   Drug use: No   Sexual activity: Not Currently  Other Topics Concern   Not on file  Social History Narrative   Not on file   Social Determinants of Health   Financial Resource Strain: Not on file  Food Insecurity: Not on file  Transportation Needs: Not on file  Physical Activity: Not on file  Stress: Not on file  Social Connections: Not on file  Intimate Partner Violence: Not on file    Family History  Problem Relation Age of Onset  Diabetes Mother    Diabetes Other      Current Outpatient Medications:    acetaminophen (TYLENOL) 325 MG tablet, Take 2 tablets (650 mg total) by mouth daily. for 5 days starting 2 days prior to treatment, Disp: 30 tablet, Rfl: 0   acyclovir (ZOVIRAX) 400 MG tablet, TAKE 1 TABLET BY MOUTH TWICE DAILY, Disp: 60 tablet, Rfl: 2   allopurinol (ZYLOPRIM) 300 MG tablet, Take 1 tablet (300 mg total) by mouth daily., Disp: 30 tablet, Rfl: 3   amLODipine (NORVASC) 5 MG tablet, Take 1 tablet (5 mg total) by mouth daily. Please schedule an office visit before anymore refills., Disp: 30 tablet, Rfl: 0   atorvastatin (LIPITOR) 40 MG tablet, Take 1 tablet (40 mg total) by mouth daily., Disp: 90 tablet, Rfl: 4   buPROPion (WELLBUTRIN XL) 150 MG 24 hr tablet, Take 1 tablet by mouth every morning., Disp: , Rfl:    clonazePAM (KLONOPIN) 0.5 MG tablet, Take 0.5 mg by mouth daily., Disp: , Rfl:     DULoxetine (CYMBALTA) 60 MG capsule, TAKE 1 CAPSULE EVERY DAY, Disp: 90 capsule, Rfl: 0   famotidine (PEPCID) 20 MG tablet, TAKE 1 TABLET TWICE DAILY, Disp: 180 tablet, Rfl: 0   finasteride (PROSCAR) 5 MG tablet, TAKE 1 TABLET BY MOUTH ONCE DAILY, Disp: 90 tablet, Rfl: 2   HYDROcodone bit-homatropine (HYCODAN) 5-1.5 MG/5ML syrup, Take 5 mLs by mouth every 4 (four) hours as needed for cough., Disp: 120 mL, Rfl: 0   lansoprazole (PREVACID) 30 MG capsule, TAKE 1 CAPSULE BY MOUTH ONCE DAILY AT NOON, Disp: 30 capsule, Rfl: 2   lidocaine-prilocaine (EMLA) cream, Apply to affected area once, Disp: 30 g, Rfl: 3   loratadine (CLARITIN) 10 MG tablet, Take 10 mg by mouth daily. , Disp: , Rfl:    metoprolol succinate (TOPROL-XL) 25 MG 24 hr tablet, Take 1 tablet (25 mg total) by mouth daily., Disp: 90 tablet, Rfl: 1   mirtazapine (REMERON) 15 MG tablet, Take 15 mg by mouth at bedtime., Disp: , Rfl:    modafinil (PROVIGIL) 200 MG tablet, Take 200 mg by mouth daily., Disp: , Rfl:    mupirocin ointment (BACTROBAN) 2 %, Apply 1 application topically 2 (two) times daily., Disp: 22 g, Rfl: 0   naltrexone (DEPADE) 50 MG tablet, , Disp: , Rfl:    OLANZapine (ZYPREXA) 7.5 MG tablet, Take 7.5 mg by mouth at bedtime., Disp: , Rfl:    ondansetron (ZOFRAN) 8 MG tablet, Take 1 tablet (8 mg total) by mouth 2 (two) times daily as needed for refractory nausea / vomiting. Start on day 2 after bendamustine chemo., Disp: 30 tablet, Rfl: 1   prochlorperazine (COMPAZINE) 10 MG tablet, TAKE 1 TABLET BY MOUTH EVERY 6 HOURS AS NEEDED NAUSEA AND VOMITING, Disp: 30 tablet, Rfl: 1   tacrolimus (PROTOPIC) 0.1 % ointment, Apply 1 application topically 2 (two) times daily., Disp: , Rfl:    tadalafil (CIALIS) 20 MG tablet, 1 tab 1 hour prior to intercourse, Disp: 10 tablet, Rfl: 0   testosterone cypionate (DEPOTESTOSTERONE CYPIONATE) 200 MG/ML injection, Inject 1 mL (200 mg total) into the muscle every 14 (fourteen) days., Disp: 10 mL, Rfl:  0   traMADol (ULTRAM) 50 MG tablet, Take 50 mg by mouth every 6 (six) hours as needed., Disp: , Rfl:    diphenhydrAMINE (BENADRYL) 25 mg capsule, Take 1 capsule (25 mg total) by mouth daily for 1 dose. Take 1 capsule daily for 5 days starting 2 days prior to treatment (  Patient not taking: Reported on 07/01/2021), Disp: 30 capsule, Rfl: 0   predniSONE (DELTASONE) 50 MG tablet, Take 1 tablet daily starting 2 days prior to treatment, no prednisone on the days of treatment, then take 1 tablet a day after chemo for 2 days with all treatments, Disp: 30 tablet, Rfl: 0   triamcinolone (KENALOG) 0.1 %, Apply to affected areas 1-2 times a day until rash improved. Avoid face, groin, underarms., Disp: 80 g, Rfl: 1 No current facility-administered medications for this visit.  Facility-Administered Medications Ordered in Other Visits:    heparin lock flush 100 unit/mL, 500 Units, Intravenous, Once, Sindy Guadeloupe, MD  Physical exam:  Vitals:   07/08/21 1149  BP: 112/90  Pulse: (!) 129  Temp: 99.8 F (37.7 C)  TempSrc: Tympanic  SpO2: 99%  Weight: 220 lb (99.8 kg)   Physical Exam Constitutional:      General: He is not in acute distress. Cardiovascular:     Rate and Rhythm: Regular rhythm. Tachycardia present.     Heart sounds: Normal heart sounds.  Pulmonary:     Effort: Pulmonary effort is normal.     Breath sounds: Normal breath sounds.  Abdominal:     General: Bowel sounds are normal.     Palpations: Abdomen is soft.  Lymphadenopathy:     Comments: No palpable cervical or axillary adenopathy  Skin:    General: Skin is warm and dry.  Neurological:     Mental Status: He is alert and oriented to person, place, and time.     CMP Latest Ref Rng & Units 07/08/2021  Glucose 70 - 99 mg/dL 118(H)  BUN 6 - 20 mg/dL 11  Creatinine 0.61 - 1.24 mg/dL 0.86  Sodium 135 - 145 mmol/L 132(L)  Potassium 3.5 - 5.1 mmol/L 3.8  Chloride 98 - 111 mmol/L 97(L)  CO2 22 - 32 mmol/L 23  Calcium 8.9 - 10.3  mg/dL 8.5(L)  Total Protein 6.5 - 8.1 g/dL 6.5  Total Bilirubin 0.3 - 1.2 mg/dL 1.0  Alkaline Phos 38 - 126 U/L 121  AST 15 - 41 U/L 31  ALT 0 - 44 U/L 16   CBC Latest Ref Rng & Units 07/08/2021  WBC 4.0 - 10.5 K/uL 5.0  Hemoglobin 13.0 - 17.0 g/dL 14.2  Hematocrit 39.0 - 52.0 % 38.9(L)  Platelets 150 - 400 K/uL 138(L)     Assessment and plan- Patient is a 60 y.o. male with stage IV grade 1 follicular lymphoma here for on treatment assessment prior to cycle 4 of Bendamustine Rituxan chemotherapy  Patient is due for his chemotherapy today but he did not take his premedications and we will move his chemo out by 1 week.  He also needs interim PET CT scan to assess response to treatment which we will schedule.  Patient knows to take premedications starting 2 days prior to treatment including prednisone Benadryl Tylenol Singulair and Pepcid given that he reacted to Rituxan.  He will continue to receive Rituxan as a split dose over 3 days.  I will tentatively see him back in 5 weeks for cycle 5 of Bendamustine Rituxan chemotherapy   Visit Diagnosis 1. 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 ([place])   2. Encounter for antineoplastic chemotherapy   3. Encounter for monitoring rituximab therapy      Dr. Randa Evens, MD, MPH [facility] [phone] 07/08/2021 4:58 PM

## 2021-07-09 ENCOUNTER — Telehealth: Payer: Self-pay | Admitting: Internal Medicine

## 2021-07-09 NOTE — Telephone Encounter (Signed)
[name] have Russell Simpson scheudle pt for Urgent Cardiology referral for this, wasn't aware of pts diagnosis.  First time seeingn pt last visit.  Thanks

## 2021-07-09 NOTE — Telephone Encounter (Signed)
Secure chat sent to [facility] notifying her of Dr. Levada Dy response.   Also notified referral coordinator of urgent referral.   Will try to contact patient again later to notify him of referral.

## 2021-07-09 NOTE — Telephone Encounter (Signed)
Received callback from [facility] with Dr. Janese Banks. She states that Dr. Janese Banks would like to know what Dr. Levada Dy plan of care is as far as the patient's sinus tachycardia and elevated HR. States that we can send a secure chart with a response.

## 2021-07-09 NOTE — Telephone Encounter (Signed)
Tried calling pt, no response, urgent referral placed to cardiology, of note Pt is a [facility] pt who was seen for a hospital follow up and was asymtpomatic from a cardiac standpoint last visit..  Will need to be seen by cards asap.

## 2021-07-09 NOTE — Telephone Encounter (Signed)
Called and LVM asking for Antony Madura to please return my call.

## 2021-07-09 NOTE — Telephone Encounter (Signed)
Received message from pts oncologist that he has had persistent tahcycardia.  Pt was here for a hospital fu and was asymptomatic then, He new to the practice, on chart review, pt is on a calcium and a b blocker. He was d/c sec to sepsis from the hospital .  Given that he has persistent tachycardia per onc notes, he will need to see Cards asap.  Tried calling pt, no answer, will have staff reach out to him reg above  Russell Simpson

## 2021-07-10 ENCOUNTER — Ambulatory Visit: Payer: Medicare HMO | Admitting: Dermatology

## 2021-07-10 ENCOUNTER — Telehealth: Payer: Self-pay | Admitting: Cardiology

## 2021-07-10 ENCOUNTER — Telehealth: Payer: Self-pay

## 2021-07-10 NOTE — Telephone Encounter (Signed)
Attempted to schedule.  LMOV to call office.    Urgent referral holding slot with Dr. Mylo Red next week.

## 2021-07-10 NOTE — Telephone Encounter (Signed)
Spoke with patient 07/08/21 and scheduled him for follow up and possible biopsies 07/10/21./js

## 2021-07-14 ENCOUNTER — Other Ambulatory Visit: Payer: Self-pay

## 2021-07-14 ENCOUNTER — Inpatient Hospital Stay: Payer: Medicare HMO

## 2021-07-14 ENCOUNTER — Other Ambulatory Visit: Payer: Self-pay | Admitting: Oncology

## 2021-07-14 VITALS — BP 130/82 | HR 106 | Temp 97.1°F | Resp 18 | Wt 223.4 lb

## 2021-07-14 DIAGNOSIS — D352 Benign neoplasm of pituitary gland: Secondary | ICD-10-CM | POA: Diagnosis not present

## 2021-07-14 DIAGNOSIS — E291 Testicular hypofunction: Secondary | ICD-10-CM | POA: Diagnosis not present

## 2021-07-14 DIAGNOSIS — Z86011 Personal history of benign neoplasm of the brain: Secondary | ICD-10-CM | POA: Diagnosis not present

## 2021-07-14 DIAGNOSIS — C8202 Follicular lymphoma grade I, intrathoracic lymph nodes: Secondary | ICD-10-CM

## 2021-07-14 DIAGNOSIS — Z5111 Encounter for antineoplastic chemotherapy: Secondary | ICD-10-CM | POA: Diagnosis not present

## 2021-07-14 DIAGNOSIS — C8208 Follicular lymphoma grade I, lymph nodes of multiple sites: Secondary | ICD-10-CM | POA: Diagnosis not present

## 2021-07-14 DIAGNOSIS — Z5189 Encounter for other specified aftercare: Secondary | ICD-10-CM | POA: Diagnosis not present

## 2021-07-14 DIAGNOSIS — Z85828 Personal history of other malignant neoplasm of skin: Secondary | ICD-10-CM | POA: Diagnosis not present

## 2021-07-14 DIAGNOSIS — E785 Hyperlipidemia, unspecified: Secondary | ICD-10-CM | POA: Diagnosis not present

## 2021-07-14 DIAGNOSIS — I10 Essential (primary) hypertension: Secondary | ICD-10-CM | POA: Diagnosis not present

## 2021-07-14 LAB — CBC WITH DIFFERENTIAL/PLATELET
Abs Immature Granulocytes: 0.09 10*3/uL — ABNORMAL HIGH (ref 0.00–0.07)
Basophils Absolute: 0 10*3/uL (ref 0.0–0.1)
Basophils Relative: 0 %
Eosinophils Absolute: 0 10*3/uL (ref 0.0–0.5)
Eosinophils Relative: 0 %
HCT: 38.2 % — ABNORMAL LOW (ref 39.0–52.0)
Hemoglobin: 13.7 g/dL (ref 13.0–17.0)
Immature Granulocytes: 3 %
Lymphocytes Relative: 39 %
Lymphs Abs: 1.2 10*3/uL (ref 0.7–4.0)
MCH: 32.4 pg (ref 26.0–34.0)
MCHC: 35.9 g/dL (ref 30.0–36.0)
MCV: 90.3 fL (ref 80.0–100.0)
Monocytes Absolute: 0.5 10*3/uL (ref 0.1–1.0)
Monocytes Relative: 16 %
Neutro Abs: 1.3 10*3/uL — ABNORMAL LOW (ref 1.7–7.7)
Neutrophils Relative %: 42 %
Platelets: 211 10*3/uL (ref 150–400)
RBC: 4.23 MIL/uL (ref 4.22–5.81)
RDW: 14.1 % (ref 11.5–15.5)
Smear Review: NORMAL
WBC: 3.1 10*3/uL — ABNORMAL LOW (ref 4.0–10.5)
nRBC: 0 % (ref 0.0–0.2)

## 2021-07-14 LAB — COMPREHENSIVE METABOLIC PANEL
ALT: 19 U/L (ref 0–44)
AST: 31 U/L (ref 15–41)
Albumin: 4 g/dL (ref 3.5–5.0)
Alkaline Phosphatase: 104 U/L (ref 38–126)
Anion gap: 11 (ref 5–15)
BUN: 8 mg/dL (ref 6–20)
CO2: 24 mmol/L (ref 22–32)
Calcium: 8.9 mg/dL (ref 8.9–10.3)
Chloride: 103 mmol/L (ref 98–111)
Creatinine, Ser: 0.75 mg/dL (ref 0.61–1.24)
GFR, Estimated: >60 mL/min (ref >60)
Glucose, Bld: 134 mg/dL — ABNORMAL HIGH (ref 70–99)
Potassium: 3.6 mmol/L (ref 3.5–5.1)
Sodium: 138 mmol/L (ref 135–145)
Total Bilirubin: 0.8 mg/dL (ref 0.3–1.2)
Total Protein: 6.8 g/dL (ref 6.5–8.1)

## 2021-07-14 MED ORDER — PALONOSETRON HCL INJECTION 0.25 MG/5ML
0.2500 mg | Freq: Once | INTRAVENOUS | Status: AC
  Administered 2021-07-14: 0.25 mg via INTRAVENOUS
  Filled 2021-07-14: qty 5

## 2021-07-14 MED ORDER — SODIUM CHLORIDE 0.9 % IV SOLN
300.0000 mg | Freq: Once | INTRAVENOUS | Status: AC
  Administered 2021-07-14: 300 mg via INTRAVENOUS
  Filled 2021-07-14: qty 30

## 2021-07-14 MED ORDER — ACETAMINOPHEN 325 MG PO TABS
650.0000 mg | ORAL_TABLET | Freq: Once | ORAL | Status: AC
  Administered 2021-07-14: 650 mg via ORAL
  Filled 2021-07-14: qty 2

## 2021-07-14 MED ORDER — MONTELUKAST SODIUM 10 MG PO TABS
10.0000 mg | ORAL_TABLET | Freq: Once | ORAL | Status: AC
  Administered 2021-07-14: 10 mg via ORAL
  Filled 2021-07-14: qty 1

## 2021-07-14 MED ORDER — SODIUM CHLORIDE 0.9 % IV SOLN
90.0000 mg/m2 | Freq: Once | INTRAVENOUS | Status: AC
  Administered 2021-07-14: 225 mg via INTRAVENOUS
  Filled 2021-07-14: qty 9

## 2021-07-14 MED ORDER — HEPARIN SOD (PORK) LOCK FLUSH 100 UNIT/ML IV SOLN
500.0000 [IU] | Freq: Once | INTRAVENOUS | Status: AC | PRN
  Administered 2021-07-14: 500 [IU]
  Filled 2021-07-14: qty 5

## 2021-07-14 MED ORDER — SODIUM CHLORIDE 0.9 % IV SOLN
10.0000 mg | Freq: Once | INTRAVENOUS | Status: AC
  Administered 2021-07-14: 10 mg via INTRAVENOUS
  Filled 2021-07-14: qty 10

## 2021-07-14 MED ORDER — HEPARIN SOD (PORK) LOCK FLUSH 100 UNIT/ML IV SOLN
500.0000 [IU] | Freq: Once | INTRAVENOUS | Status: AC
  Filled 2021-07-14: qty 5

## 2021-07-14 MED ORDER — DIPHENHYDRAMINE HCL 50 MG/ML IJ SOLN
50.0000 mg | Freq: Once | INTRAMUSCULAR | Status: AC
  Administered 2021-07-14: 50 mg via INTRAVENOUS
  Filled 2021-07-14: qty 1

## 2021-07-14 MED ORDER — SODIUM CHLORIDE 0.9 % IV SOLN
Freq: Once | INTRAVENOUS | Status: AC
Start: 2021-07-14 — End: 2021-07-14
  Filled 2021-07-14: qty 250

## 2021-07-14 MED ORDER — SODIUM CHLORIDE 0.9% FLUSH
10.0000 mL | INTRAVENOUS | Status: AC | PRN
  Filled 2021-07-14: qty 10

## 2021-07-14 MED ORDER — FAMOTIDINE 20 MG IN NS 100 ML IVPB
20.0000 mg | Freq: Once | INTRAVENOUS | Status: AC
  Administered 2021-07-14: 20 mg via INTRAVENOUS
  Filled 2021-07-14: qty 20

## 2021-07-14 NOTE — Progress Notes (Signed)
Per MD ok to treat with HR 108 and ANC 1.3.

## 2021-07-14 NOTE — Patient Instructions (Signed)
[facility]  Discharge Instructions: Thank you for choosing [facility] to provide your oncology and hematology care.  If you have a lab appointment with the [facility], please go directly to the [facility] and check in at the registration area.  Wear comfortable clothing and clothing appropriate for easy access to any Portacath or PICC line.   We strive to give you quality time with your provider. You may need to reschedule your appointment if you arrive late (15 or more minutes).  Arriving late affects you and other patients whose appointments are after yours.  Also, if you miss three or more appointments without notifying the office, you may be dismissed from the clinic at the provider's discretion.      For prescription refill requests, have your pharmacy contact our office and allow 72 hours for refills to be completed.    Today you received the following chemotherapy and/or immunotherapy agents : Ruxience / Bendeka     To help prevent nausea and vomiting after your treatment, we encourage you to take your nausea medication as directed.  BELOW ARE SYMPTOMS THAT SHOULD BE REPORTED IMMEDIATELY: *FEVER GREATER THAN 100.4 F (38 C) OR HIGHER *CHILLS OR SWEATING *NAUSEA AND VOMITING THAT IS NOT CONTROLLED WITH YOUR NAUSEA MEDICATION *UNUSUAL SHORTNESS OF BREATH *UNUSUAL BRUISING OR BLEEDING *URINARY PROBLEMS (pain or burning when urinating, or frequent urination) *BOWEL PROBLEMS (unusual diarrhea, constipation, pain near the anus) TENDERNESS IN MOUTH AND THROAT WITH OR WITHOUT PRESENCE OF ULCERS (sore throat, sores in mouth, or a toothache) UNUSUAL RASH, SWELLING OR PAIN  UNUSUAL VAGINAL DISCHARGE OR ITCHING   Items with * indicate a potential emergency and should be followed up as soon as possible or go to the Emergency Department if any problems should occur.  Please show the CHEMOTHERAPY ALERT CARD or IMMUNOTHERAPY ALERT CARD at  check-in to the Emergency Department and triage nurse.  Should you have questions after your visit or need to cancel or reschedule your appointment, please contact [facility]  [phone] and follow the prompts.  Office hours are 8:00 a.m. to 4:30 p.m. Monday - Friday. Please note that voicemails left after 4:00 p.m. may not be returned until the following business day.  We are closed weekends and major holidays. You have access to a nurse at all times for urgent questions. Please call the main number to the clinic [phone] and follow the prompts.  For any non-urgent questions, you may also contact your provider using MyChart. We now offer e-Visits for anyone 45 and older to request care online for non-urgent symptoms. For details visit [email or web address].   Also download the MyChart app! Go to the app store, search "MyChart", open the app, select [facility], and log in with your MyChart username and password.  Due to Covid, a mask is required upon entering the hospital/clinic. If you do not have a mask, one will be given to you upon arrival. For doctor visits, patients may have 1 support person aged 29 or older with them. For treatment visits, patients cannot have anyone with them due to current Covid guidelines and our immunocompromised population.

## 2021-07-14 NOTE — Progress Notes (Signed)
Pt states took oral pre-meds at home last night- but not day before.  Ok per MD to tx. Per MD- ok to tx with HR 108 and ANC 1.3.

## 2021-07-15 ENCOUNTER — Inpatient Hospital Stay: Payer: Medicare HMO

## 2021-07-15 ENCOUNTER — Ambulatory Visit: Payer: Medicare HMO

## 2021-07-15 VITALS — BP 132/91 | HR 96 | Temp 97.6°F | Resp 18

## 2021-07-15 DIAGNOSIS — E291 Testicular hypofunction: Secondary | ICD-10-CM | POA: Diagnosis not present

## 2021-07-15 DIAGNOSIS — C8202 Follicular lymphoma grade I, intrathoracic lymph nodes: Secondary | ICD-10-CM

## 2021-07-15 DIAGNOSIS — Z5189 Encounter for other specified aftercare: Secondary | ICD-10-CM | POA: Diagnosis not present

## 2021-07-15 DIAGNOSIS — Z5111 Encounter for antineoplastic chemotherapy: Secondary | ICD-10-CM | POA: Diagnosis not present

## 2021-07-15 DIAGNOSIS — E785 Hyperlipidemia, unspecified: Secondary | ICD-10-CM | POA: Diagnosis not present

## 2021-07-15 DIAGNOSIS — D352 Benign neoplasm of pituitary gland: Secondary | ICD-10-CM | POA: Diagnosis not present

## 2021-07-15 DIAGNOSIS — Z86011 Personal history of benign neoplasm of the brain: Secondary | ICD-10-CM | POA: Diagnosis not present

## 2021-07-15 DIAGNOSIS — Z85828 Personal history of other malignant neoplasm of skin: Secondary | ICD-10-CM | POA: Diagnosis not present

## 2021-07-15 DIAGNOSIS — C8208 Follicular lymphoma grade I, lymph nodes of multiple sites: Secondary | ICD-10-CM | POA: Diagnosis not present

## 2021-07-15 DIAGNOSIS — I10 Essential (primary) hypertension: Secondary | ICD-10-CM | POA: Diagnosis not present

## 2021-07-15 MED ORDER — FAMOTIDINE 20 MG IN NS 100 ML IVPB
20.0000 mg | Freq: Once | INTRAVENOUS | Status: AC
  Administered 2021-07-15: 20 mg via INTRAVENOUS
  Filled 2021-07-15: qty 20

## 2021-07-15 MED ORDER — HEPARIN SOD (PORK) LOCK FLUSH 100 UNIT/ML IV SOLN
INTRAVENOUS | Status: AC
  Administered 2021-07-15: 500 [IU]
  Filled 2021-07-15: qty 5

## 2021-07-15 MED ORDER — SODIUM CHLORIDE 0.9 % IV SOLN
300.0000 mg | Freq: Once | INTRAVENOUS | Status: AC
  Administered 2021-07-15: 300 mg via INTRAVENOUS
  Filled 2021-07-15: qty 30

## 2021-07-15 MED ORDER — SODIUM CHLORIDE 0.9 % IV SOLN
Freq: Once | INTRAVENOUS | Status: AC
  Filled 2021-07-15: qty 250

## 2021-07-15 MED ORDER — MONTELUKAST SODIUM 10 MG PO TABS
10.0000 mg | ORAL_TABLET | Freq: Once | ORAL | Status: AC
  Administered 2021-07-15: 10 mg via ORAL
  Filled 2021-07-15: qty 1

## 2021-07-15 MED ORDER — SODIUM CHLORIDE 0.9 % IV SOLN
10.0000 mg | Freq: Once | INTRAVENOUS | Status: AC
  Administered 2021-07-15: 10 mg via INTRAVENOUS
  Filled 2021-07-15: qty 10

## 2021-07-15 MED ORDER — ACETAMINOPHEN 325 MG PO TABS
650.0000 mg | ORAL_TABLET | Freq: Once | ORAL | Status: AC
  Administered 2021-07-15: 650 mg via ORAL
  Filled 2021-07-15: qty 2

## 2021-07-15 MED ORDER — DIPHENHYDRAMINE HCL 50 MG/ML IJ SOLN
50.0000 mg | Freq: Once | INTRAMUSCULAR | Status: AC
  Administered 2021-07-15: 50 mg via INTRAVENOUS
  Filled 2021-07-15: qty 1

## 2021-07-15 MED ORDER — SODIUM CHLORIDE 0.9 % IV SOLN
90.0000 mg/m2 | Freq: Once | INTRAVENOUS | Status: AC
  Administered 2021-07-15: 225 mg via INTRAVENOUS
  Filled 2021-07-15: qty 9

## 2021-07-15 MED ORDER — PALONOSETRON HCL INJECTION 0.25 MG/5ML
0.2500 mg | Freq: Once | INTRAVENOUS | Status: AC
  Administered 2021-07-15: 0.25 mg via INTRAVENOUS
  Filled 2021-07-15: qty 5

## 2021-07-15 NOTE — Patient Instructions (Signed)
[facility]  Discharge Instructions: Thank you for choosing [facility] to provide your oncology and hematology care.  If you have a lab appointment with the [facility], please go directly to the [facility] and check in at the registration area.  Wear comfortable clothing and clothing appropriate for easy access to any Portacath or PICC line.   We strive to give you quality time with your provider. You may need to reschedule your appointment if you arrive late (15 or more minutes).  Arriving late affects you and other patients whose appointments are after yours.  Also, if you miss three or more appointments without notifying the office, you may be dismissed from the clinic at the provider's discretion.      For prescription refill requests, have your pharmacy contact our office and allow 72 hours for refills to be completed.    Today you received the following chemotherapy and/or immunotherapy agents: Bendamustine, Ruxience      To help prevent nausea and vomiting after your treatment, we encourage you to take your nausea medication as directed.  BELOW ARE SYMPTOMS THAT SHOULD BE REPORTED IMMEDIATELY: *FEVER GREATER THAN 100.4 F (38 C) OR HIGHER *CHILLS OR SWEATING *NAUSEA AND VOMITING THAT IS NOT CONTROLLED WITH YOUR NAUSEA MEDICATION *UNUSUAL SHORTNESS OF BREATH *UNUSUAL BRUISING OR BLEEDING *URINARY PROBLEMS (pain or burning when urinating, or frequent urination) *BOWEL PROBLEMS (unusual diarrhea, constipation, pain near the anus) TENDERNESS IN MOUTH AND THROAT WITH OR WITHOUT PRESENCE OF ULCERS (sore throat, sores in mouth, or a toothache) UNUSUAL RASH, SWELLING OR PAIN  UNUSUAL VAGINAL DISCHARGE OR ITCHING   Items with * indicate a potential emergency and should be followed up as soon as possible or go to the Emergency Department if any problems should occur.  Please show the CHEMOTHERAPY ALERT CARD or IMMUNOTHERAPY ALERT CARD  at check-in to the Emergency Department and triage nurse.  Should you have questions after your visit or need to cancel or reschedule your appointment, please contact [facility]  [phone] and follow the prompts.  Office hours are 8:00 a.m. to 4:30 p.m. Monday - Friday. Please note that voicemails left after 4:00 p.m. may not be returned until the following business day.  We are closed weekends and major holidays. You have access to a nurse at all times for urgent questions. Please call the main number to the clinic [phone] and follow the prompts.  For any non-urgent questions, you may also contact your provider using MyChart. We now offer e-Visits for anyone 38 and older to request care online for non-urgent symptoms. For details visit [email or web address].   Also download the MyChart app! Go to the app store, search "MyChart", open the app, select [facility], and log in with your MyChart username and password.  Due to Covid, a mask is required upon entering the hospital/clinic. If you do not have a mask, one will be given to you upon arrival. For doctor visits, patients may have 1 support person aged 15 or older with them. For treatment visits, patients cannot have anyone with them due to current Covid guidelines and our immunocompromised population. Bendamustine Injection What is this medication? BENDAMUSTINE (BEN da MUS teen) is a chemotherapy drug. It is used to treatchronic lymphocytic leukemia and non-Hodgkin lymphoma. This medicine may be used for other purposes; ask your health care provider orpharmacist if you have questions. COMMON BRAND NAME(S): Kristine Royal, Treanda What should I tell my care team  before I take this medication? They need to know if you have any of these conditions: infection (especially a virus infection such as chickenpox, cold sores, or herpes) kidney disease liver disease an unusual or allergic reaction to  bendamustine, mannitol, other medicines, foods, dyes, or preservatives pregnant or trying to get pregnant breast-feeding How should I use this medication? This medicine is for infusion into a vein. It is given by a health careprofessional in a hospital or clinic setting. Talk to your pediatrician regarding the use of this medicine in children.Special care may be needed. Overdosage: If you think you have taken too much of this medicine contact apoison control center or emergency room at once. NOTE: This medicine is only for you. Do not share this medicine with others. What if I miss a dose? It is important not to miss your dose. Call your doctor or health careprofessional if you are unable to keep an appointment. What may interact with this medication? Do not take this medicine with any of the following medications: clozapine This medicine may also interact with the following medications: atazanavir cimetidine ciprofloxacin enoxacin fluvoxamine medicines for seizures like carbamazepine and phenobarbital mexiletine rifampin tacrine thiabendazole zileuton This list may not describe all possible interactions. Give your health care provider a list of all the medicines, herbs, non-prescription drugs, or dietary supplements you use. Also tell them if you smoke, drink alcohol, or use illegaldrugs. Some items may interact with your medicine. What should I watch for while using this medication? This drug may make you feel generally unwell. This is not uncommon, as chemotherapy can affect healthy cells as well as cancer cells. Report any side effects. Continue your course of treatment even though you feel ill unless yourdoctor tells you to stop. You may need blood work done while you are taking this medicine. Call your doctor or healthcare provider for advice if you get a fever, chills or sore throat, or other symptoms of a cold or flu. Do not treat yourself. This drug decreases your body's ability  to fight infections. Try to avoid beingaround people who are sick. This medicine may cause serious skin reactions. They can happen weeks to months after starting the medicine. Contact your healthcare provider right away if you notice fevers or flu-like symptoms with a rash. The rash may be red or purple and then turn into blisters or peeling of the skin. Or, you might notice a red rash with swelling of the face, lips or lymph nodes in your neck or under yourarms. In some patients, this medicine may cause a serious brain infection that may cause death. If you have any problems seeing, thinking, speaking, walking, or standing, tell your health care provider right away. If you cannot reach yourhealth care provider, urgently seek other source of medical care. This medicine may increase your risk to bruise or bleed. Call your doctor orhealthcare provider if you notice any unusual bleeding. Talk to your doctor about your risk of cancer. You may be more at risk forcertain types of cancers if you take this medicine. This medicine may increase your risk of skin cancer. Check your skin for changes to moles or for new growths while taking this medicine. Call yourhealth care provider if you notice any of these skin changes. Do not become pregnant while taking this medicine or for at least 6 months after stopping it. Women should inform their doctor if they wish to become pregnant or think they might be pregnant. Men should not father a child  while taking this medicine and for at least 3 months after stopping it. There is a potential for serious side effects to an unborn child. Talk to your healthcare provider or pharmacist for more information. Do not breast-feed an infant whiletaking this medicine or for at least 1 week after stopping it. This medicine may make it more difficult to father a child. You should talk with your doctor or healthcare provider if you are concerned about yourfertility. What side effects may I  notice from receiving this medication? Side effects that you should report to your doctor or health care professionalas soon as possible: allergic reactions like skin rash, itching or hives, swelling of the face, lips, or tongue low blood counts - this medicine may decrease the number of white blood cells, red blood cells and platelets. You may be at increased risk for infections and bleeding. rash, fever, and swollen lymph nodes redness, blistering, peeling, or loosening of the skin, including inside the mouth signs of infection like fever or chills, cough, sore throat, pain or difficulty passing urine signs of decreased platelets or bleeding like bruising, pinpoint red spots on the skin, black, tarry stools, blood in the urine signs of decreased red blood cells like being unusually weak or tired, fainting spells, lightheadedness signs and symptoms of kidney injury like trouble passing urine or change in the amount of urine signs and symptoms of liver injury like dark yellow or brown urine; general ill feeling or flu-like symptoms; light-colored stools; loss of appetite; nausea; right upper belly pain; unusually weak or tired; yellowing of the eyes or skin Side effects that usually do not require medical attention (report to yourdoctor or health care professional if they continue or are bothersome): constipation decreased appetite diarrhea headache mouth sores nausea, vomiting tiredness This list may not describe all possible side effects. Call your doctor for medical advice about side effects. You may report side effects to FDA at1-800-FDA-1088. Where should I keep my medication? This drug is given in a hospital or clinic and will not be stored at home. NOTE: This sheet is a summary. It may not cover all possible information. If you have questions about this medicine, talk to your doctor, pharmacist, orhealth care provider.  2022 Elsevier/Gold Standard (2020-05-13 12:11:43) Rituximab  Injection What is this medication? RITUXIMAB (ri TUX i mab) is a monoclonal antibody. It is used to treat certain types of cancer like non-Hodgkin lymphoma and chronic lymphocytic leukemia. It is also used to treat rheumatoid arthritis, granulomatosis with polyangiitis,microscopic polyangiitis, and pemphigus vulgaris. This medicine may be used for other purposes; ask your health care provider orpharmacist if you have questions. COMMON BRAND NAME(S): RIABNI, Rituxan, RUXIENCE What should I tell my care team before I take this medication? They need to know if you have any of these conditions: chest pain heart disease infection especially a viral infection such as chickenpox, cold sores, hepatitis B, or herpes immune system problems irregular heartbeat or rhythm kidney disease low blood counts (white cells, platelets, or red cells) lung disease recent or upcoming vaccine an unusual or allergic reaction to rituximab, other medicines, foods, dyes, or preservatives pregnant or trying to get pregnant breast-feeding How should I use this medication? This medicine is injected into a vein. It is given by a health care provider ina hospital or clinic setting. A special MedGuide will be given to you before each treatment. Be sure to readthis information carefully each time. Talk to your health care provider about the use of this medicine  in children. While this drug may be prescribed for children as young as 6 months forselected conditions, precautions do apply. Overdosage: If you think you have taken too much of this medicine contact apoison control center or emergency room at once. NOTE: This medicine is only for you. Do not share this medicine with others. What if I miss a dose? Keep appointments for follow-up doses. It is important not to miss your dose.Call your health care provider if you are unable to keep an appointment. What may interact with this medication? Do not take this medicine with  any of the following medicines: live vaccines This medicine may also interact with the following medicines: cisplatin This list may not describe all possible interactions. Give your health care provider a list of all the medicines, herbs, non-prescription drugs, or dietary supplements you use. Also tell them if you smoke, drink alcohol, or use illegaldrugs. Some items may interact with your medicine. What should I watch for while using this medication? Your condition will be monitored carefully while you are receiving thismedicine. You may need blood work done while you are taking this medicine. This medicine can cause serious infusion reactions. To reduce the risk your health care provider may give you other medicines to take before receiving thisone. Be sure to follow the directions from your health care provider. This medicine may increase your risk of getting an infection. Call your health care provider for advice if you get a fever, chills, sore throat, or other symptoms of a cold or flu. Do not treat yourself. Try to avoid being aroundpeople who are sick. Call your health care provider if you are around anyone with measles,chickenpox, or if you develop sores or blisters that do not heal properly. Avoid taking medicines that contain aspirin, acetaminophen, ibuprofen, naproxen, or ketoprofen unless instructed by your health care provider. Thesemedicines may hide a fever. This medicine may cause serious skin reactions. They can happen weeks to months after starting the medicine. Contact your health care provider right away if you notice fevers or flu-like symptoms with a rash. The rash may be red or purple and then turn into blisters or peeling of the skin. Or, you might notice a red rash with swelling of the face, lips or lymph nodes in your neck or underyour arms. In some patients, this medicine may cause a serious brain infection that may cause death. If you have any problems seeing, thinking,  speaking, walking, or standing, tell your healthcare professional right away. If you cannot reachyour healthcare professional, urgently seek other source of medical care. Do not become pregnant while taking this medicine or for at least 12 months after stopping it. Women should inform their health care provider if they wish to become pregnant or think they might be pregnant. There is potential for serious harm to an unborn child. Talk to your health care provider for more information. Women should use a reliable form of birth control while taking this medicine and for 12 months after stopping it. Do not breast-feed whiletaking this medicine or for at least 6 months after stopping it. What side effects may I notice from receiving this medication? Side effects that you should report to your health care provider as soon aspossible: allergic reactions (skin rash, itching or hives; swelling of the face, lips, or tongue) diarrhea edema (sudden weight gain; swelling of the ankles, feet, hands or other unusual swelling; trouble breathing) fast, irregular heartbeat heart attack (trouble breathing; pain or tightness in the chest, neck, back  or arms; unusually weak or tired) infection (fever, chills, cough, sore throat, pain or trouble passing urine) kidney injury (trouble passing urine or change in the amount of urine) liver injury (dark yellow or brown urine; general ill feeling or flu-like symptoms; loss of appetite, right upper belly pain; unusually weak or tired, yellowing of the eyes or skin) low blood pressure (dizziness; feeling faint or lightheaded, falls; unusually weak or tired) low red blood cell counts (trouble breathing; feeling faint; lightheaded, falls; unusually weak or tired) mouth sores redness, blistering, peeling, or loosening of the skin, including inside the mouth stomach pain unusual bruising or bleeding wheezing (trouble breathing with loud or whistling sounds) vomiting Side effects  that usually do not require medical attention (report to yourhealth care provider if they continue or are bothersome): headache joint pain muscle cramps, pain nausea This list may not describe all possible side effects. Call your doctor for medical advice about side effects. You may report side effects to FDA at1-800-FDA-1088. Where should I keep my medication? This medicine is given in a hospital or clinic. It will not be stored at home. NOTE: This sheet is a summary. It may not cover all possible information. If you have questions about this medicine, talk to your doctor, pharmacist, orhealth care provider.  2022 Elsevier/Gold Standard (2020-11-07 15:47:26)

## 2021-07-16 ENCOUNTER — Inpatient Hospital Stay: Payer: Medicare HMO

## 2021-07-16 ENCOUNTER — Ambulatory Visit: Payer: Medicare HMO | Admitting: Internal Medicine

## 2021-07-16 ENCOUNTER — Other Ambulatory Visit: Payer: Self-pay

## 2021-07-16 ENCOUNTER — Other Ambulatory Visit: Payer: Self-pay | Admitting: Oncology

## 2021-07-16 VITALS — BP 143/90 | HR 91 | Temp 96.6°F | Resp 18

## 2021-07-16 DIAGNOSIS — Z86011 Personal history of benign neoplasm of the brain: Secondary | ICD-10-CM | POA: Diagnosis not present

## 2021-07-16 DIAGNOSIS — Z85828 Personal history of other malignant neoplasm of skin: Secondary | ICD-10-CM | POA: Diagnosis not present

## 2021-07-16 DIAGNOSIS — Z5189 Encounter for other specified aftercare: Secondary | ICD-10-CM | POA: Diagnosis not present

## 2021-07-16 DIAGNOSIS — C8208 Follicular lymphoma grade I, lymph nodes of multiple sites: Secondary | ICD-10-CM | POA: Diagnosis not present

## 2021-07-16 DIAGNOSIS — D352 Benign neoplasm of pituitary gland: Secondary | ICD-10-CM | POA: Diagnosis not present

## 2021-07-16 DIAGNOSIS — C8202 Follicular lymphoma grade I, intrathoracic lymph nodes: Secondary | ICD-10-CM

## 2021-07-16 DIAGNOSIS — I10 Essential (primary) hypertension: Secondary | ICD-10-CM | POA: Diagnosis not present

## 2021-07-16 DIAGNOSIS — E291 Testicular hypofunction: Secondary | ICD-10-CM | POA: Diagnosis not present

## 2021-07-16 DIAGNOSIS — E785 Hyperlipidemia, unspecified: Secondary | ICD-10-CM | POA: Diagnosis not present

## 2021-07-16 DIAGNOSIS — Z5111 Encounter for antineoplastic chemotherapy: Secondary | ICD-10-CM | POA: Diagnosis not present

## 2021-07-16 MED ORDER — PEGFILGRASTIM-CBQV 6 MG/0.6ML ~~LOC~~ SOSY
6.0000 mg | PREFILLED_SYRINGE | Freq: Once | SUBCUTANEOUS | Status: AC
  Administered 2021-07-16: 6 mg via SUBCUTANEOUS
  Filled 2021-07-16: qty 0.6

## 2021-07-16 MED ORDER — SODIUM CHLORIDE 0.9 % IV SOLN
Freq: Once | INTRAVENOUS | Status: AC
  Filled 2021-07-16: qty 250

## 2021-07-16 MED ORDER — ACETAMINOPHEN 325 MG PO TABS
650.0000 mg | ORAL_TABLET | Freq: Once | ORAL | Status: AC
  Administered 2021-07-16: 650 mg via ORAL
  Filled 2021-07-16: qty 2

## 2021-07-16 MED ORDER — DIPHENHYDRAMINE HCL 50 MG/ML IJ SOLN
50.0000 mg | Freq: Once | INTRAMUSCULAR | Status: AC
  Administered 2021-07-16: 50 mg via INTRAVENOUS
  Filled 2021-07-16: qty 1

## 2021-07-16 MED ORDER — MONTELUKAST SODIUM 10 MG PO TABS
10.0000 mg | ORAL_TABLET | Freq: Once | ORAL | Status: AC
  Administered 2021-07-16: 10 mg via ORAL
  Filled 2021-07-16: qty 1

## 2021-07-16 MED ORDER — SODIUM CHLORIDE 0.9% FLUSH
10.0000 mL | INTRAVENOUS | Status: DC | PRN
  Administered 2021-07-16: 10 mL
  Filled 2021-07-16: qty 10

## 2021-07-16 MED ORDER — SODIUM CHLORIDE 0.9 % IV SOLN
300.0000 mg | Freq: Once | INTRAVENOUS | Status: AC
  Administered 2021-07-16: 300 mg via INTRAVENOUS
  Filled 2021-07-16: qty 30

## 2021-07-16 MED ORDER — HEPARIN SOD (PORK) LOCK FLUSH 100 UNIT/ML IV SOLN
500.0000 [IU] | Freq: Once | INTRAVENOUS | Status: AC | PRN
  Administered 2021-07-16: 500 [IU]
  Filled 2021-07-16: qty 5

## 2021-07-16 MED ORDER — PALONOSETRON HCL INJECTION 0.25 MG/5ML: 0.2500 mg | Freq: Once | INTRAVENOUS | Status: DC

## 2021-07-16 MED ORDER — HEPARIN SOD (PORK) LOCK FLUSH 100 UNIT/ML IV SOLN
INTRAVENOUS | Status: AC
  Filled 2021-07-16: qty 5

## 2021-07-16 MED ORDER — FAMOTIDINE 20 MG IN NS 100 ML IVPB
20.0000 mg | Freq: Once | INTRAVENOUS | Status: AC
  Administered 2021-07-16: 20 mg via INTRAVENOUS
  Filled 2021-07-16: qty 100
  Filled 2021-07-16: qty 20

## 2021-07-16 MED ORDER — SODIUM CHLORIDE 0.9 % IV SOLN
10.0000 mg | Freq: Once | INTRAVENOUS | Status: AC
  Administered 2021-07-16: 10 mg via INTRAVENOUS
  Filled 2021-07-16: qty 10

## 2021-07-16 NOTE — Progress Notes (Signed)
Per MD, Dr. Janese Banks, order: patient should receive Udenyca injection today; order is in treatment plan.

## 2021-07-16 NOTE — Patient Instructions (Signed)
[facility]   Discharge Instructions: Thank you for choosing [facility] to provide your oncology and hematology care.  If you have a lab appointment with the [facility], please go directly to the [facility] and check in at the registration area.  Wear comfortable clothing and clothing appropriate for easy access to any Portacath or PICC line.   We strive to give you quality time with your provider. You may need to reschedule your appointment if you arrive late (15 or more minutes).  Arriving late affects you and other patients whose appointments are after yours.  Also, if you miss three or more appointments without notifying the office, you may be dismissed from the clinic at the provider's discretion.      For prescription refill requests, have your pharmacy contact our office and allow 72 hours for refills to be completed.    Today you received the following chemotherapy and/or immunotherapy agents: Ruxience.      To help prevent nausea and vomiting after your treatment, we encourage you to take your nausea medication as directed.  BELOW ARE SYMPTOMS THAT SHOULD BE REPORTED IMMEDIATELY: *FEVER GREATER THAN 100.4 F (38 C) OR HIGHER *CHILLS OR SWEATING *NAUSEA AND VOMITING THAT IS NOT CONTROLLED WITH YOUR NAUSEA MEDICATION *UNUSUAL SHORTNESS OF BREATH *UNUSUAL BRUISING OR BLEEDING *URINARY PROBLEMS (pain or burning when urinating, or frequent urination) *BOWEL PROBLEMS (unusual diarrhea, constipation, pain near the anus) TENDERNESS IN MOUTH AND THROAT WITH OR WITHOUT PRESENCE OF ULCERS (sore throat, sores in mouth, or a toothache) UNUSUAL RASH, SWELLING OR PAIN  UNUSUAL VAGINAL DISCHARGE OR ITCHING   Items with * indicate a potential emergency and should be followed up as soon as possible or go to the Emergency Department if any problems should occur.  Please show the CHEMOTHERAPY ALERT CARD or IMMUNOTHERAPY ALERT CARD at  check-in to the Emergency Department and triage nurse.  Should you have questions after your visit or need to cancel or reschedule your appointment, please contact [facility]  [phone] and follow the prompts.  Office hours are 8:00 a.m. to 4:30 p.m. Monday - Friday. Please note that voicemails left after 4:00 p.m. may not be returned until the following business day.  We are closed weekends and major holidays. You have access to a nurse at all times for urgent questions. Please call the main number to the clinic [phone] and follow the prompts.  For any non-urgent questions, you may also contact your provider using MyChart. We now offer e-Visits for anyone 77 and older to request care online for non-urgent symptoms. For details visit [email or web address].   Also download the MyChart app! Go to the app store, search "MyChart", open the app, select [facility], and log in with your MyChart username and password.  Due to Covid, a mask is required upon entering the hospital/clinic. If you do not have a mask, one will be given to you upon arrival. For doctor visits, patients may have 1 support person aged 73 or older with them. For treatment visits, patients cannot have anyone with them due to current Covid guidelines and our immunocompromised population.

## 2021-07-21 NOTE — Telephone Encounter (Signed)
Can you please offer patient another appointment as soon as we can fit him in? Thank you!

## 2021-07-21 NOTE — Telephone Encounter (Signed)
There is an 8 am available this Wednesday. Can you please see if he can come then? Thank you!

## 2021-07-22 ENCOUNTER — Encounter
Admission: RE | Admit: 2021-07-22 | Discharge: 2021-07-22 | Disposition: A | Discharge status: Home / Self Care | Payer: Medicare HMO | Source: Ambulatory Visit | Attending: Oncology | Admitting: Oncology

## 2021-07-22 ENCOUNTER — Other Ambulatory Visit: Payer: Self-pay

## 2021-07-22 DIAGNOSIS — C8590 Non-Hodgkin lymphoma, unspecified, unspecified site: Secondary | ICD-10-CM | POA: Diagnosis not present

## 2021-07-22 DIAGNOSIS — Z79899 Other long term (current) drug therapy: Secondary | ICD-10-CM | POA: Diagnosis not present

## 2021-07-22 DIAGNOSIS — I2510 Atherosclerotic heart disease of native coronary artery without angina pectoris: Secondary | ICD-10-CM | POA: Diagnosis not present

## 2021-07-22 DIAGNOSIS — C8202 Follicular lymphoma grade I, intrathoracic lymph nodes: Secondary | ICD-10-CM | POA: Insufficient documentation

## 2021-07-22 DIAGNOSIS — K760 Fatty (change of) liver, not elsewhere classified: Secondary | ICD-10-CM | POA: Diagnosis not present

## 2021-07-22 DIAGNOSIS — I700 Atherosclerosis of aorta: Secondary | ICD-10-CM | POA: Diagnosis not present

## 2021-07-22 LAB — GLUCOSE, CAPILLARY: Glucose-Capillary: 139 mg/dL — ABNORMAL HIGH (ref 70–99)

## 2021-07-22 MED ORDER — FLUDEOXYGLUCOSE F - 18 (FDG) INJECTION
11.6000 | Freq: Once | INTRAVENOUS | Status: AC | PRN
  Administered 2021-07-22: 12.03 via INTRAVENOUS

## 2021-07-22 NOTE — Telephone Encounter (Signed)
We are opening regular slots next Tuesday August 30 between 11-12. Can you please offer him a slot then or during one of my pump breaks that's opened? Thank you!

## 2021-07-22 NOTE — Telephone Encounter (Signed)
Correction - August 30 between 11-12 we are opening for regular appointments. I also see 3 spots open in the next few day just checking the regular calendar. I could also see him during one of my pump breaks (10-10:30) on a day no one else is scheduled. Thank you!

## 2021-07-23 ENCOUNTER — Ambulatory Visit
Admission: RE | Admit: 2021-07-23 | Discharge: 2021-07-23 | Disposition: A | Discharge status: Home / Self Care | Payer: Medicare HMO | Source: Ambulatory Visit | Attending: Gastroenterology | Admitting: Gastroenterology

## 2021-07-23 DIAGNOSIS — R748 Abnormal levels of other serum enzymes: Secondary | ICD-10-CM | POA: Insufficient documentation

## 2021-07-23 DIAGNOSIS — K760 Fatty (change of) liver, not elsewhere classified: Secondary | ICD-10-CM | POA: Diagnosis not present

## 2021-07-24 ENCOUNTER — Ambulatory Visit: Payer: Medicare HMO

## 2021-07-28 ENCOUNTER — Other Ambulatory Visit: Payer: Self-pay | Admitting: Family Medicine

## 2021-07-28 ENCOUNTER — Other Ambulatory Visit: Payer: Self-pay | Admitting: Urology

## 2021-07-28 NOTE — Telephone Encounter (Signed)
Requested medications are due for refill today.  unknown  Requested medications are on the active medications list.  no  Last refill. 02/19/2021  Future visit scheduled.   yes  Notes to clinic.  Medication was discontinued 06/05/2021

## 2021-07-29 ENCOUNTER — Other Ambulatory Visit: Payer: Self-pay

## 2021-07-29 ENCOUNTER — Encounter: Payer: Self-pay | Admitting: Dermatology

## 2021-07-29 ENCOUNTER — Ambulatory Visit (INDEPENDENT_AMBULATORY_CARE_PROVIDER_SITE_OTHER): Payer: Medicare HMO | Admitting: Dermatology

## 2021-07-29 ENCOUNTER — Encounter: Payer: Self-pay | Admitting: Oncology

## 2021-07-29 DIAGNOSIS — Z85828 Personal history of other malignant neoplasm of skin: Secondary | ICD-10-CM

## 2021-07-29 DIAGNOSIS — D045 Carcinoma in situ of skin of trunk: Secondary | ICD-10-CM | POA: Diagnosis not present

## 2021-07-29 DIAGNOSIS — L821 Other seborrheic keratosis: Secondary | ICD-10-CM | POA: Diagnosis not present

## 2021-07-29 DIAGNOSIS — L570 Actinic keratosis: Secondary | ICD-10-CM | POA: Diagnosis not present

## 2021-07-29 DIAGNOSIS — C44622 Squamous cell carcinoma of skin of right upper limb, including shoulder: Secondary | ICD-10-CM

## 2021-07-29 DIAGNOSIS — D485 Neoplasm of uncertain behavior of skin: Secondary | ICD-10-CM

## 2021-07-29 MED ORDER — MUPIROCIN 2 % EX OINT: 1.0000 "application " | TOPICAL_OINTMENT | Freq: Every day | CUTANEOUS | 0 refills | Status: DC

## 2021-07-29 NOTE — Patient Instructions (Addendum)
Cryotherapy Aftercare  Wash gently with soap and water everyday.   Apply Vaseline and Band-Aid daily until healed.   Prior to procedure, discussed risks of blister formation, small wound, skin dyspigmentation, or rare scar following cryotherapy. Recommend Vaseline ointment to treated areas while healing.   Wound Care Instructions  Cleanse wound gently with soap and water once a day then pat dry with clean gauze. Apply a thing coat of Petrolatum (petroleum jelly, "Vaseline") over the wound (unless you have an allergy to this). We recommend that you use a new, sterile tube of Vaseline. Do not pick or remove scabs. Do not remove the yellow or white "healing tissue" from the base of the wound.  Cover the wound with fresh, clean, nonstick gauze and secure with paper tape. You may use Band-Aids in place of gauze and tape if the would is small enough, but would recommend trimming much of the tape off as there is often too much. Sometimes Band-Aids can irritate the skin.  You should call the office for your biopsy report after 1 week if you have not already been contacted.  If you experience any problems, such as abnormal amounts of bleeding, swelling, significant bruising, significant pain, or evidence of infection, please call the office immediately.  FOR ADULT SURGERY PATIENTS: If you need something for pain relief you may take 1 extra strength Tylenol (acetaminophen) AND 2 Ibuprofen ('200mg'$  each) together every 4 hours as needed for pain. (do not take these if you are allergic to them or if you have a reason you should not take them.) Typically, you may only need pain medication for 1 to 3 days.   If you have any questions or concerns for your doctor, please call our main line at [phone] and press option 4 to reach your doctor's medical assistant. If no one answers, please leave a voicemail as directed and we will return your call as soon as possible. Messages left after 4 pm will be answered the  following business day.   You may also send [place] a message via [place]. We typically respond to MyChart messages within 1-2 business days.  For prescription refills, please ask your pharmacy to contact our office. Our fax number is [phone].  If you have an urgent issue when the clinic is closed that cannot wait until the next business day, you can page your doctor at the number below.    Please note that while we do our best to be available for urgent issues outside of office hours, we are not available 24/7.   If you have an urgent issue and are unable to reach [place], you may choose to seek medical care at your doctor's office, retail clinic, urgent care center, or emergency room.  If you have a medical emergency, please immediately call 911 or go to the emergency department.  Pager Numbers  - Dr. Nehemiah Massed: [phone]  - Dr. Laurence Ferrari: [phone]  - Dr. Nicole Kindred: [phone]  In the event of inclement weather, please call our main line at [phone] for an update on the status of any delays or closures.  Dermatology Medication Tips: Please keep the boxes that topical medications come in in order to help keep track of the instructions about where and how to use these. Pharmacies typically print the medication instructions only on the boxes and not directly on the medication tubes.   If your medication is too expensive, please contact our office at [phone] option 4 or send [place] a message through [place].  We are unable to tell what your co-pay for medications will be in advance as this is different depending on your insurance coverage. However, we may be able to find a substitute medication at lower cost or fill out paperwork to get insurance to cover a needed medication.   If a prior authorization is required to get your medication covered by your insurance company, please allow [place] 1-2 business days to complete this process.  Drug prices often vary depending on where the  prescription is filled and some pharmacies may offer cheaper prices.  The website www.goodrx.com contains coupons for medications through different pharmacies. The prices here do not account for what the cost may be with help from insurance (it may be cheaper with your insurance), but the website can give you the price if you did not use any insurance.  - You can print the associated coupon and take it with your prescription to the pharmacy.  - You may also stop by our office during regular business hours and pick up a GoodRx coupon card.  - If you need your prescription sent electronically to a different pharmacy, notify our office through [facility] or by phone at [phone] option 4.

## 2021-07-29 NOTE — Progress Notes (Signed)
Follow-Up Visit   Subjective  Russell Simpson is a 60 y.o. male who presents for the following: Follow-up (Patient here today for biopsy at left upper abdomen. Patient also has a spot at right shoulder, several for a few months and won't heal. ) and Rash (Patient also with rash at chest. Patient was given TMC 0.1% cream and tacrolimus earlier this year and advises the [facility] : [facility]  does seem to help. ).    The following portions of the chart were reviewed this encounter and updated as appropriate:   Tobacco  Allergies  Meds  Problems  Med Hx  Surg Hx  Fam Hx      Review of Systems:  No other skin or systemic complaints except as noted in HPI or Assessment and Plan.  Objective  Well appearing patient in no apparent distress; mood and affect are within normal limits.  A focused examination was performed including face, chest, neck. Relevant physical exam findings are noted in the Assessment and Plan.  Left upper abdomen 0.9cm scaly pink papule     Right anterior shoulder 1.3cm pink plaque     Mid chest x 1, left jaw x 1, right ant shoulder x 1, right forehead x 1 (4) Erythematous thin papules/macules with gritty scale.       Assessment & Plan  Neoplasm of uncertain behavior of skin (2) Left upper abdomen  Skin / nail biopsy Type of biopsy: tangential   Informed consent: discussed and consent obtained   Timeout: patient name, date of birth, surgical site, and procedure verified   Patient was prepped and draped in usual sterile fashion: Area prepped with isopropyl alcohol. Anesthesia: the lesion was anesthetized in a standard fashion   Anesthetic:  1% lidocaine w/ epinephrine 1-100,000 buffered w/ 8.4% NaHCO3 Instrument used: flexible razor blade   Hemostasis achieved with: aluminum chloride   Outcome: patient tolerated procedure well   Post-procedure details: wound care instructions given   Additional details:  Mupirocin and a bandage applied  mupirocin ointment  (BACTROBAN) 2 % Apply 1 application topically daily. With dressing changes  Specimen 1 - Surgical pathology Differential Diagnosis: R/o SCC  Check Margins: No 0.9cm scaly pink papule  Right anterior shoulder  Skin / nail biopsy Type of biopsy: tangential   Informed consent: discussed and consent obtained   Timeout: patient name, date of birth, surgical site, and procedure verified   Patient was prepped and draped in usual sterile fashion: Area prepped with isopropyl alcohol. Anesthesia: the lesion was anesthetized in a standard fashion   Anesthetic:  1% lidocaine w/ epinephrine 1-100,000 buffered w/ 8.4% NaHCO3 Instrument used: flexible razor blade   Hemostasis achieved with: aluminum chloride   Outcome: patient tolerated procedure well   Post-procedure details: wound care instructions given   Additional details:  Mupirocin and a bandage applied  Specimen 2 - Surgical pathology Differential Diagnosis: R/o SCCis  Check Margins: No 1.3cm pink plaque  AK (actinic keratosis) (4) Mid chest x 1, left jaw x 1, right ant shoulder x 1, right forehead x 1  Hypertrophic at mid chest  Destruction of lesion - Mid chest x 1, left jaw x 1, right ant shoulder x 1, right forehead x 1  Destruction method: cryotherapy   Informed consent: discussed and consent obtained   Lesion destroyed using liquid nitrogen: Yes   Cryotherapy cycles:  2 Outcome: patient tolerated procedure well with no complications   Post-procedure details: wound care instructions given    Seborrheic Keratoses - Stuck-on,  waxy, tan-brown papules and/or plaques  - Benign-appearing - Discussed benign etiology and prognosis. 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  - Call if  any new or changing lesions are noted between office visits     Return for TBSE, as scheduled.  Graciella Belton, RMA, am acting as scribe for Forest Gleason, MD .  Documentation: I have reviewed the above documentation for accuracy and completeness, and I agree with the above.  Forest Gleason, MD

## 2021-07-30 ENCOUNTER — Telehealth: Payer: Self-pay

## 2021-07-30 NOTE — Telephone Encounter (Signed)
Pt advised since no longer a pt at [facility] we can not complete form. Pt verbally stated understanding and stated he will come by office to get form and take to new PCP.

## 2021-07-30 NOTE — Telephone Encounter (Signed)
He is going to try imodium more frequently since he doesn't want to come in. If he is not better he will call.

## 2021-07-30 NOTE — Telephone Encounter (Signed)
This patient is no longer a patient at our office.  Patient established care at [place] on 06/04/21      Copied from [place] [id number]. Topic: General - Other >> Jul 30, 2021  3:52 PM Pawlus, Brayton Layman A wrote: Reason for CRM: Pt stated he came by last Wednesday and gave a form to [place], pt called back regarding the status of this form he dropped off,  please advise.

## 2021-07-30 NOTE — Telephone Encounter (Signed)
Please call him? Consider stool sample for c diff and prn lomotil

## 2021-07-30 NOTE — Telephone Encounter (Signed)
Reached out to pt to gather details about "diarrhea episodes" Every time he has the urge to go he needs to be near the bathroom, taking augmentin- helps some but it continues to happen scared to go anywhere afraid he might have an accident. States he is also taking imodium 2x daily. Informed him to go ahead and take it q2 hours as needed up to 8 doses per Dr. Elroy Channel suggestion. He said he will try that and if it does not help he will return the call.

## 2021-07-31 ENCOUNTER — Telehealth: Payer: Self-pay

## 2021-07-31 NOTE — Telephone Encounter (Signed)
-----   Message from Alfonso Patten, MD sent at 07/31/2021 10:22 AM EDT ----- 1. Skin , left upper abdomen SQUAMOUS CELL CARCINOMA IN SITU ARISING IN A SEBORRHEIC KERATOSIS, BASE INVOLVED --> ED&C  2. Skin , right anterior shoulder WELL DIFFERENTIATED SQUAMOUS CELL CARCINOMA WITH SUPERFICIAL INFILTRATION, PERIPHERAL MARGIN INVOLVED, SEE DESCRIPTION --> ED&C and recheck soon after  Dr. Jerilynn Mages left voicemail 07/31/2021 at 10:20 am  MAs please give patient results if he calls and call starting next business day to schedule for ED&Cs. Thank you!

## 2021-07-31 NOTE — Telephone Encounter (Signed)
Patient advised of BX results and EDCs scheduled.aw

## 2021-07-31 NOTE — Telephone Encounter (Signed)
Patient dropped off disability form to be completed for tax exclusion.  Upon completion patient would like to be called at [phone] to pick up.  Placed in provider's folder for completion.

## 2021-08-01 NOTE — Telephone Encounter (Signed)
It appears patient was seen 1x for [facility]. 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 We will not be able to fill out this form as he is not an established patient here.

## 2021-08-01 NOTE — Telephone Encounter (Signed)
Patient came into office and picked up the tax paperwork, he is aware that we can not fill out paperwork at this time due to being a new patient.  Patient will bring the tax paperwork in to his appointment 08/25/2021.

## 2021-08-01 NOTE — Telephone Encounter (Signed)
Called and left a detailed message for patient letting him know' \\what'$  Dr.Johnson said.

## 2021-08-05 ENCOUNTER — Inpatient Hospital Stay: Payer: Medicare HMO

## 2021-08-05 ENCOUNTER — Inpatient Hospital Stay (HOSPITAL_BASED_OUTPATIENT_CLINIC_OR_DEPARTMENT_OTHER): Payer: Medicare HMO | Admitting: Oncology

## 2021-08-05 ENCOUNTER — Encounter: Payer: Self-pay | Admitting: Oncology

## 2021-08-05 ENCOUNTER — Inpatient Hospital Stay: Payer: Medicare HMO | Attending: Hospice and Palliative Medicine

## 2021-08-05 VITALS — BP 134/97 | HR 117 | Temp 99.2°F | Resp 18 | Wt 221.0 lb

## 2021-08-05 DIAGNOSIS — Z79899 Other long term (current) drug therapy: Secondary | ICD-10-CM

## 2021-08-05 DIAGNOSIS — Z5189 Encounter for other specified aftercare: Secondary | ICD-10-CM | POA: Insufficient documentation

## 2021-08-05 DIAGNOSIS — R197 Diarrhea, unspecified: Secondary | ICD-10-CM | POA: Diagnosis not present

## 2021-08-05 DIAGNOSIS — Z5111 Encounter for antineoplastic chemotherapy: Secondary | ICD-10-CM | POA: Insufficient documentation

## 2021-08-05 DIAGNOSIS — F32A Depression, unspecified: Secondary | ICD-10-CM | POA: Insufficient documentation

## 2021-08-05 DIAGNOSIS — R000 Tachycardia, unspecified: Secondary | ICD-10-CM | POA: Insufficient documentation

## 2021-08-05 DIAGNOSIS — I10 Essential (primary) hypertension: Secondary | ICD-10-CM | POA: Insufficient documentation

## 2021-08-05 DIAGNOSIS — C8208 Follicular lymphoma grade I, lymph nodes of multiple sites: Secondary | ICD-10-CM | POA: Insufficient documentation

## 2021-08-05 DIAGNOSIS — E785 Hyperlipidemia, unspecified: Secondary | ICD-10-CM | POA: Diagnosis not present

## 2021-08-05 DIAGNOSIS — C8202 Follicular lymphoma grade I, intrathoracic lymph nodes: Secondary | ICD-10-CM

## 2021-08-05 DIAGNOSIS — Z5181 Encounter for therapeutic drug level monitoring: Secondary | ICD-10-CM

## 2021-08-05 LAB — CBC WITH DIFFERENTIAL/PLATELET
Abs Immature Granulocytes: 0.41 10*3/uL — ABNORMAL HIGH (ref 0.00–0.07)
Basophils Absolute: 0 10*3/uL (ref 0.0–0.1)
Basophils Relative: 1 %
Eosinophils Absolute: 0.1 10*3/uL (ref 0.0–0.5)
Eosinophils Relative: 2 %
HCT: 36.6 % — ABNORMAL LOW (ref 39.0–52.0)
Hemoglobin: 13.3 g/dL (ref 13.0–17.0)
Immature Granulocytes: 7 %
Lymphocytes Relative: 23 %
Lymphs Abs: 1.3 10*3/uL (ref 0.7–4.0)
MCH: 32.4 pg (ref 26.0–34.0)
MCHC: 36.3 g/dL — ABNORMAL HIGH (ref 30.0–36.0)
MCV: 89.1 fL (ref 80.0–100.0)
Monocytes Absolute: 0.8 10*3/uL (ref 0.1–1.0)
Monocytes Relative: 15 %
Neutro Abs: 2.9 10*3/uL (ref 1.7–7.7)
Neutrophils Relative %: 52 %
Platelets: 155 10*3/uL (ref 150–400)
RBC: 4.11 MIL/uL — ABNORMAL LOW (ref 4.22–5.81)
RDW: 14.3 % (ref 11.5–15.5)
Smear Review: NORMAL
WBC: 5.6 10*3/uL (ref 4.0–10.5)
nRBC: 0 % (ref 0.0–0.2)

## 2021-08-05 LAB — COMPREHENSIVE METABOLIC PANEL
ALT: 17 U/L (ref 0–44)
AST: 34 U/L (ref 15–41)
Albumin: 3.9 g/dL (ref 3.5–5.0)
Alkaline Phosphatase: 111 U/L (ref 38–126)
Anion gap: 9 (ref 5–15)
BUN: 5 mg/dL — ABNORMAL LOW (ref 6–20)
CO2: 25 mmol/L (ref 22–32)
Calcium: 8.7 mg/dL — ABNORMAL LOW (ref 8.9–10.3)
Chloride: 98 mmol/L (ref 98–111)
Creatinine, Ser: 0.55 mg/dL — ABNORMAL LOW (ref 0.61–1.24)
GFR, Estimated: >60 mL/min (ref >60)
Glucose, Bld: 128 mg/dL — ABNORMAL HIGH (ref 70–99)
Potassium: 3.8 mmol/L (ref 3.5–5.1)
Sodium: 132 mmol/L — ABNORMAL LOW (ref 135–145)
Total Bilirubin: 0.6 mg/dL (ref 0.3–1.2)
Total Protein: 6.1 g/dL — ABNORMAL LOW (ref 6.5–8.1)

## 2021-08-05 LAB — LACTATE DEHYDROGENASE: LDH: 426 U/L — ABNORMAL HIGH (ref 98–192)

## 2021-08-05 MED ORDER — HEPARIN SOD (PORK) LOCK FLUSH 100 UNIT/ML IV SOLN
500.0000 [IU] | Freq: Once | INTRAVENOUS | Status: AC
  Administered 2021-08-05: 500 [IU] via INTRAVENOUS
  Filled 2021-08-05: qty 5

## 2021-08-05 MED ORDER — SODIUM CHLORIDE 0.9% FLUSH
10.0000 mL | Freq: Once | INTRAVENOUS | Status: AC
  Administered 2021-08-05: 10 mL via INTRAVENOUS
  Filled 2021-08-05: qty 10

## 2021-08-05 NOTE — Progress Notes (Signed)
Patient here for oncology follow-up appointment,  concerns extressive diarrhea, elevated temp/HR, and urinary frequency

## 2021-08-05 NOTE — Progress Notes (Signed)
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 [name]  He sustained a left anterior frontal sinus as well as supraorbital rim fracture on 11/21/2019 which was managed conservatively.  He then presented to the ER at [facility] with symptoms of right-sided chest wall pain this led to a CT PE which did not show any evidence of pulmonary embolism.  He was noted to have bilateral symmetric axillary adenopathy measuring up to 1.8 cm.  Scattered mediastinal adenopathy.  Right paratracheal node measuring up to 1.2 cm.  Prominent right costophrenic lymph node measuring up to 1 cm.  Findings are nonspecific but could be seen with lymphoma versus systemic inflammatory disease such as sarcoidosis.  Of note patient has had a prior CT scan for lung screening back in 2015 when he was not noted to have this adenopathy.    Repeat CT chest in August 2021 showed persistent mild nonbulky axillary and mediastinal adenopathy which had not changed significantly as compared to his prior CT in December 2020.  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   Repeat CT in March 2022 showed Progression and multistation adenopathy as well as significant splenomegaly measuring 20.4 cm as compared to 14.5 cm  on CT scan September 2021.  CT scan showed multistation adenopathy with SUVs ranging between 6-8.9.  Patient underwent another core biopsy of right inguinal lymph node which was consistent with follicular lymphoma grade 1-2.  Bone marrow biopsy also showed moderate involvement with non-Hodgkin's B-cell lymphoma.  Baseline hepatitis B testing showed core antibody positivity.  Seen by GI and started on tenofovir   Patient had symptoms of nausea and sweating during cycle 1 of Rituxan which resolved with additional premedications.  He developed symptoms of itching over neck chest back and bilateral eyes with second dose of Rituxan early and had to get more premedications and was not rechallenged on the same day.  Patient subsequently received cycle 3 of Bendamustine Rituxan chemotherapy after Rituxan was given as a split dose over 3 days  Scans after 4 cycles of Bendamustine Rituxan chemotherapy showedSignificant improvement with therapy.  Lymphadenopathy in his neck and mediastinum resolved Deauville 2.  Decrease in the splenic size and hypermetabolism.  No new lesions.  FDG accumulation within the skeleton presumably due to stimulatory effects of treatment.   Interval history-patient reports that he had diarrhea which lasted for about 4 days and got better after he started taking as needed Imodium.  He then ate out at his daughter's place yesterday night and again started having diarrhea since this morning.  He is drinking Pedialyte regularly.  Denies any significant abdominal pain but feels that his belly has a rumbling sensation.  He has also developed 2 new areas on his chest wall which are concerning for skin cancer and will require excision by dermatology.  ECOG PS- 1  Pain scale- 0   Review of systems- Review of Systems  Constitutional:  Negative for chills, fever, malaise/fatigue and weight loss.  HENT:  Negative for congestion, ear discharge and nosebleeds.   Eyes:  Negative for blurred vision.   Respiratory:  Negative for cough, hemoptysis, sputum production, shortness of breath and wheezing.   Cardiovascular:  Negative for chest pain, palpitations, orthopnea and claudication.  Gastrointestinal:  Negative for abdominal pain, blood in stool, constipation, diarrhea, heartburn, melena, nausea and vomiting.  Genitourinary:  Negative for dysuria, flank pain, frequency, hematuria and urgency.  Musculoskeletal:  Negative for back pain, joint pain and myalgias.  Skin:  Negative for rash.  Neurological:  Negative for dizziness, tingling, focal weakness, seizures, weakness and headaches.  Endo/Heme/Allergies:  Does not bruise/bleed easily.  Psychiatric/Behavioral:  Negative for depression and suicidal ideas. The patient does not have insomnia.      Allergies  Allergen Reactions   Penicillins Anaphylaxis    Tolerates cefdinir, cephalexin and amoxicillin/clavulonate so true PCN allergy unlikely   Tetanus Toxoids Swelling   Lisinopril Cough   Losartan      Other reaction(s): Muscle Pain     Tetanus Toxoid    Codeine Nausea Only and Nausea And Vomiting   Doxycycline Rash   Ruxience [Rituximab-Pvvr] Rash    05/06/21 pt w/ worsening rash during Ruxience infusion.  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[place])    Arthritis    Asthma    Basal cell carcinoma 01/28/2021   R upper arm, EDC 03/04/2021   Brain 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   Chronic pain    right arm   Depression    Environmental and seasonal allergies    Follicular lymphoma ([place])    Hypertension    Pneumonia    Sleep apnea    does not wear CPAP ; uses humidifier instead   Squamous cell carcinoma of skin 02/19/2021   L inferior mandible, treated with EDC / new tumor found 07/2021     Past Surgical History:  Procedure Laterality Date   ANTERIOR CERVICAL DECOMP/DISCECTOMY FUSION N/A 06/17/2016   Procedure: ANTERIOR CERVICAL  DECOMPRESSION FUSION, CERVICAL 3-4, CERVICAL 4-5 WITH INSTRUMENTATION AND ALLOGRAFT;  Surgeon: Phylliss Bob, MD;  Location: [facility];  Service: Orthopedics;  Laterality: N/A;  ANTERIOR CERVICAL DECOMPRESSION FUSION, CERVICAL 3-4, CERVICAL 4-5 WITH INSTRUMENTATION AND ALLOGRAFT   BACK SURGERY     NECK SURGERY  2009   PORTA CATH INSERTION N/A 04/03/2021   Procedure: PORTA CATH INSERTION;  Surgeon: Algernon Huxley, MD;  Location: [place] [facility];  Service: Cardiovascular;  Laterality: N/A;    Social History   Socioeconomic History   Marital status: Divorced    Spouse name: Not on file   Number of children: Not on file   Years of education: Not on file   Highest education level: Not on file  Occupational History   Not on file  Tobacco Use   Smoking status: Every Day    Types: Cigars   Smokeless tobacco: Never   Tobacco comments:    5-6 cigars a day  Vaping Use   Vaping Use: Former   Start date: 04/30/2017   Quit date: 11/30/2017  Substance and Sexual Activity   Alcohol use: Not Currently    Alcohol/week: 0.0 standard drinks   Drug use: No   Sexual activity: Not Currently  Other Topics Concern  Not on file  Social History Narrative   Not on file   Social Determinants of Health   Financial Resource Strain: Not on file  Food Insecurity: Not on file  Transportation Needs: Not on file  Physical Activity: Not on file  Stress: Not on file  Social Connections: Not on file  Intimate Partner Violence: Not on file    Family History  Problem Relation Age of Onset   Diabetes Mother    Diabetes Other      Current Outpatient Medications:    acetaminophen (TYLENOL) 325 MG tablet, Take 2 tablets (650 mg total) by mouth daily. for 5 days starting 2 days prior to treatment, Disp: 30 tablet, Rfl: 0   acyclovir (ZOVIRAX) 400 MG tablet, TAKE 1 TABLET BY MOUTH TWICE DAILY, Disp: 60 tablet, Rfl: 2   allopurinol (ZYLOPRIM) 300 MG tablet, Take 1 tablet (300 mg total) by mouth daily., Disp: 30  tablet, Rfl: 3   amLODipine (NORVASC) 5 MG tablet, Take 1 tablet (5 mg total) by mouth daily. Please schedule an office visit before anymore refills., Disp: 30 tablet, Rfl: 0   atorvastatin (LIPITOR) 40 MG tablet, Take 1 tablet (40 mg total) by mouth daily., Disp: 90 tablet, Rfl: 4   buPROPion (WELLBUTRIN XL) 150 MG 24 hr tablet, Take 1 tablet by mouth every morning., Disp: , Rfl:    clonazePAM (KLONOPIN) 0.5 MG tablet, Take 0.5 mg by mouth daily., Disp: , Rfl:    diphenhydrAMINE (BENADRYL) 25 mg capsule, Take 1 capsule (25 mg total) by mouth daily for 1 dose. Take 1 capsule daily for 5 days starting 2 days prior to treatment (Patient not taking: Reported on 07/01/2021), Disp: 30 capsule, Rfl: 0   DULoxetine (CYMBALTA) 60 MG capsule, TAKE 1 CAPSULE EVERY DAY, Disp: 90 capsule, Rfl: 0   famotidine (PEPCID) 20 MG tablet, TAKE 1 TABLET TWICE DAILY, Disp: 180 tablet, Rfl: 0   finasteride (PROSCAR) 5 MG tablet, TAKE 1 TABLET BY MOUTH ONCE DAILY, Disp: 90 tablet, Rfl: 2   HYDROcodone bit-homatropine (HYCODAN) 5-1.5 MG/5ML syrup, Take 5 mLs by mouth every 4 (four) hours as needed for cough., Disp: 120 mL, Rfl: 0   lansoprazole (PREVACID) 30 MG capsule, TAKE 1 CAPSULE BY MOUTH ONCE DAILY AT NOON, Disp: 30 capsule, Rfl: 2   lidocaine-prilocaine (EMLA) cream, Apply to affected area once, Disp: 30 g, Rfl: 3   loratadine (CLARITIN) 10 MG tablet, Take 10 mg by mouth daily. , Disp: , Rfl:    metoprolol succinate (TOPROL-XL) 25 MG 24 hr tablet, Take 1 tablet (25 mg total) by mouth daily., Disp: 90 tablet, Rfl: 1   mirtazapine (REMERON) 15 MG tablet, Take 15 mg by mouth at bedtime., Disp: , Rfl:    modafinil (PROVIGIL) 200 MG tablet, Take 200 mg by mouth daily., Disp: , Rfl:    mupirocin ointment (BACTROBAN) 2 %, Apply 1 application topically 2 (two) times daily., Disp: 22 g, Rfl: 0   mupirocin ointment (BACTROBAN) 2 %, Apply 1 application topically daily. With dressing changes, Disp: 22 g, Rfl: 0   naltrexone  (DEPADE) 50 MG tablet, , Disp: , Rfl:    OLANZapine (ZYPREXA) 7.5 MG tablet, Take 7.5 mg by mouth at bedtime., Disp: , Rfl:    ondansetron (ZOFRAN) 8 MG tablet, Take 1 tablet (8 mg total) by mouth 2 (two) times daily as needed for refractory nausea / vomiting. Start on day 2 after bendamustine chemo., Disp: 30 tablet, Rfl: 1   predniSONE (DELTASONE) 50 MG  tablet, Take 1 tablet daily starting 2 days prior to treatment, no prednisone on the days of treatment, then take 1 tablet a day after chemo for 2 days with all treatments, Disp: 30 tablet, Rfl: 0   prochlorperazine (COMPAZINE) 10 MG tablet, TAKE 1 TABLET BY MOUTH EVERY 6 HOURS AS NEEDED NAUSEA AND VOMITING, Disp: 30 tablet, Rfl: 1   sildenafil (REVATIO) 20 MG tablet, TAKE 3-5 TABLETS BY MOUTH ONCE DAILY AS NEEDED FOR ERECTILE DYSFUNCTION, Disp: 30 tablet, Rfl: 0   tacrolimus (PROTOPIC) 0.1 % ointment, Apply 1 application topically 2 (two) times daily., Disp: , Rfl:    tadalafil (CIALIS) 20 MG tablet, 1 tab 1 hour prior to intercourse, Disp: 10 tablet, Rfl: 0   testosterone cypionate (DEPOTESTOSTERONE CYPIONATE) 200 MG/ML injection, Inject 1 mL (200 mg total) into the muscle every 14 (fourteen) days., Disp: 10 mL, Rfl: 0   traMADol (ULTRAM) 50 MG tablet, Take 50 mg by mouth every 6 (six) hours as needed., Disp: , Rfl:    triamcinolone (KENALOG) 0.1 %, Apply to affected areas 1-2 times a day until rash improved. Avoid face, groin, underarms., Disp: 80 g, Rfl: 1 No current facility-administered medications for this visit.  Facility-Administered Medications Ordered in Other Visits:    heparin lock flush 100 unit/mL, 500 Units, Intravenous, Once, Sindy Guadeloupe, MD   heparin lock flush 100 unit/mL, 500 Units, Intravenous, Once, Sindy Guadeloupe, MD   sodium chloride flush (NS) 0.9 % injection 10 mL, 10 mL, Intravenous, PRN, Sindy Guadeloupe, MD  Physical exam:  Vitals:   08/05/21 0902  BP: (!) 134/97  Pulse: (!) 117  Resp: 18  Temp: 99.2 F (37.3  C)  SpO2: 98%  Weight: 221 lb (100.2 kg)   Physical Exam Constitutional:      General: He is not in acute distress. Cardiovascular:     Rate and Rhythm: Regular rhythm. Tachycardia present.     Heart sounds: Normal heart sounds.  Pulmonary:     Effort: Pulmonary effort is normal.     Breath sounds: Normal breath sounds.  Abdominal:     General: Bowel sounds are normal. There is no distension.     Palpations: Abdomen is soft.     Tenderness: no abdominal tenderness  Skin:    General: Skin is warm and dry.  Neurological:     Mental Status: He is alert and oriented to person, place, and time.     CMP Latest Ref Rng & Units 08/05/2021  Glucose 70 - 99 mg/dL 128(H)  BUN 6 - 20 mg/dL 5(L)  Creatinine 0.61 - 1.24 mg/dL 0.55(L)  Sodium 135 - 145 mmol/L 132(L)  Potassium 3.5 - 5.1 mmol/L 3.8  Chloride 98 - 111 mmol/L 98  CO2 22 - 32 mmol/L 25  Calcium 8.9 - 10.3 mg/dL 8.7(L)  Total Protein 6.5 - 8.1 g/dL 6.1(L)  Total Bilirubin 0.3 - 1.2 mg/dL 0.6  Alkaline Phos 38 - 126 U/L 111  AST 15 - 41 U/L 34  ALT 0 - 44 U/L 17   CBC Latest Ref Rng & Units 08/05/2021  WBC 4.0 - 10.5 K/uL 5.6  Hemoglobin 13.0 - 17.0 g/dL 13.3  Hematocrit 39.0 - 52.0 % 36.6(L)  Platelets 150 - 400 K/uL 155    No images are attached to the encounter.  NM PET Image Restag (PS) Skull Base To Thigh  Result Date: 07/23/2021 CLINICAL DATA:  Subsequent treatment strategy for non-Hodgkin's lymphoma. EXAM: NUCLEAR MEDICINE PET SKULL BASE TO THIGH  TECHNIQUE: 12.0 mCi F-18 FDG was injected intravenously. Full-ring PET imaging was performed from the skull base to thigh after the radiotracer. CT data was obtained and used for attenuation correction and anatomic localization. Fasting blood glucose: 139 mg/dl COMPARISON:  03/10/2021 FINDINGS: Mediastinal blood pool activity: SUV max 2.2 Liver activity: SUV max 4.3 NECK: Hypermetabolic lymphadenopathy in the neck seen previously has resolved in the interval. Left-sided level  2 index node on the previous study was 13 mm short axis (remeasured) which compares to 4 mm short axis today on 35/3. SUV max = 1.2, decreased from 5.5. Right level II lymph node identified on the previous study has decreased from 12 mm short axis (remeasured) to 6 mm on today's study. SUV max = 1.0 today, decreased from 7.2 previously. Incidental CT findings: none CHEST: Similar marked decrease in thoracic lymphadenopathy. Index right paratracheal node is 6 mm short axis today, decreased from 12 mm previously. SUV max = 1.0 today, decreased from 6.2 previously. Index right axillary node measured previously at 19 mm short axis is 6 mm short axis today (89/3). SUV max = 0.7 today decreased from 6.8 previously. Left axillary index node is 6 mm today, decreased from 16 mm previously. SUV max = 0.6, decreased from 7.3. Incidental CT findings: Coronary artery calcification is evident. Mild atherosclerotic calcification is noted in the wall of the thoracic aorta. Right Port-A-Cath is new in the interval. The line is looped in the right internal jugular vein with the tip directed centrally in the right innominate vein, just proximal to the confluence. ABDOMEN/PELVIS: Splenomegaly seen previously has resolved. Splenic activity today is below liver at SUV max = 3.1. Abdominal lymphadenopathy is similarly decreased in the interval. Index 2.2 cm aortocaval node measured previously is 0.8 cm today (196/3). SUV max = 2.5 today decreased from 8.9 previously. Index 2.2 cm right external iliac node measured previously is 0.6 cm today. SUV max = 1.3 today, decreased from 10.3 previously. The index left external iliac node measured previously at 2.5 cm is now 0.7 cm with SUV max = 0.9, decreased from 10.9 previously. Incidental CT findings: There is moderate atherosclerotic calcification of the abdominal aorta without aneurysm. Insert fatty liver diverticular changes noted left colon without diverticulitis. SKELETON: Diffuse  radiotracer accumulation noted within the marrow space, new in the interval. Incidental CT findings: none IMPRESSION: 1. Marked interval response to therapy. Lymphadenopathy has resolved on CT imaging with no residual hypermetabolism. Index nodes on today's study are Deauville 2 category. 2. Interval decrease in splenic size and hypermetabolism. Splenic uptake is below liver levels on today's study. 3. No new hypermetabolic soft tissue disease on today's study. 4. Interval development of diffuse FDG accumulation within the skeleton, presumably related to stimulatory effects of therapy. 5.  Aortic Atherosclerois (ICD10-170.0) Electronically Signed   By: Misty Stanley M.D.   On: 07/23/2021 10:24   US ABDOMEN LIMITED RUQ (LIVER/GB)  Result Date: 07/23/2021 CLINICAL DATA:  Elevated alkaline phosphatase level EXAM: ULTRASOUND ABDOMEN LIMITED RIGHT UPPER QUADRANT COMPARISON:  PET-CT from the previous day. FINDINGS: Gallbladder: No gallstones or wall thickening visualized. No sonographic Murphy sign noted by sonographer. Common bile duct: Diameter: 6 mm Liver: Diffuse increased echogenicity is noted likely related to fatty infiltration. No focal mass is noted. Portal vein is patent on color Doppler imaging with normal direction of blood flow towards the liver. Other: None. IMPRESSION: Fatty infiltration of the liver. No other focal abnormality is noted. Electronically Signed   By: Linus Mako.D.  On: 07/23/2021 23:07     Assessment and plan- Patient is a 60 y.o. male with stage IV follicular lymphoma grade 1 here for on treatment assessment prior to cycle 5 of Bendamustine Rituxan chemotherapy  I have reviewed PET/CT scan images independently and discussed findings with the patient which shows excellent response to treatment with reduction in the spleen size and hypermetabolism as well as reduction in the size ofHypermetabolic lymph nodes Deauville 2.  Plan is to complete 2 more cycles of chemotherapy and you  supposed to receive cycle 5 today.  However patient has had ongoing diarrhea for the last 1 week.  It got better after taking as needed Imodium but then patient again began to have diarrhea this morning.  Would like to check stool for C. difficile as well as GI panel PCR.  Patient unable to give a stool sample while at the clinic and he is agreeable to dropping it off later today.  Kidney function and potassium is normal.  He does not overtly appear dehydrated.  He has baseline tachycardia even before his diarrhea.  Patient will continue Pedialyte and does not wish to receive any IV fluids today.  Depending on how his diarrhea does over the next week we could potentially give him treatment next week.  I will see him back in 5 weeks from now for cycle 6 of Bendamustine Rituxan chemotherapy   Visit Diagnosis 1. Diarrhea, unspecified type   2. 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 ([place])   3. Encounter for antineoplastic chemotherapy   4. Encounter for monitoring rituximab therapy      Dr. Randa Evens, MD, MPH [facility] [phone] 08/05/2021 12:47 PM

## 2021-08-05 NOTE — Progress Notes (Signed)
Patient port deaccessed. No treatment today per MD. No concerns voiced, patient discharged, stable.

## 2021-08-06 ENCOUNTER — Inpatient Hospital Stay: Payer: Medicare HMO

## 2021-08-07 ENCOUNTER — Inpatient Hospital Stay: Payer: Medicare HMO

## 2021-08-08 ENCOUNTER — Other Ambulatory Visit: Payer: Self-pay

## 2021-08-08 ENCOUNTER — Telehealth: Payer: Self-pay | Admitting: *Deleted

## 2021-08-08 DIAGNOSIS — R197 Diarrhea, unspecified: Secondary | ICD-10-CM | POA: Diagnosis not present

## 2021-08-08 DIAGNOSIS — Z5111 Encounter for antineoplastic chemotherapy: Secondary | ICD-10-CM | POA: Diagnosis not present

## 2021-08-08 DIAGNOSIS — E785 Hyperlipidemia, unspecified: Secondary | ICD-10-CM | POA: Diagnosis not present

## 2021-08-08 DIAGNOSIS — I10 Essential (primary) hypertension: Secondary | ICD-10-CM | POA: Diagnosis not present

## 2021-08-08 DIAGNOSIS — Z5189 Encounter for other specified aftercare: Secondary | ICD-10-CM | POA: Diagnosis not present

## 2021-08-08 DIAGNOSIS — R000 Tachycardia, unspecified: Secondary | ICD-10-CM | POA: Diagnosis not present

## 2021-08-08 DIAGNOSIS — C8208 Follicular lymphoma grade I, lymph nodes of multiple sites: Secondary | ICD-10-CM | POA: Diagnosis not present

## 2021-08-08 DIAGNOSIS — F32A Depression, unspecified: Secondary | ICD-10-CM | POA: Diagnosis not present

## 2021-08-08 LAB — GASTROINTESTINAL PANEL BY PCR, STOOL (REPLACES STOOL CULTURE)

## 2021-08-08 LAB — C DIFFICILE QUICK SCREEN W PCR REFLEX
C Diff antigen: NEGATIVE
C Diff interpretation: NOT DETECTED
C Diff toxin: NEGATIVE

## 2021-08-08 MED ORDER — AZITHROMYCIN 500 MG PO TABS: 500.0000 mg | ORAL_TABLET | Freq: Every day | ORAL | 0 refills | Status: DC

## 2021-08-08 NOTE — Telephone Encounter (Signed)
Called results for GI panel positive for Campylobacter.

## 2021-08-08 NOTE — Telephone Encounter (Signed)
I called and spoke with patient daughter who will call patient and call me back

## 2021-08-08 NOTE — Telephone Encounter (Signed)
See secure chat message

## 2021-08-08 NOTE — Telephone Encounter (Signed)
Patient called and reports that he doses in fact still have diarrhea. I informed him that antibiotics was being sent to [name] to take 2 a day for 3 days ad if on Monday his diarrhea is not better to call back and we would send more antibiotics in. He thanked me and said that he is tired of having diarrhea and that he will call back Monday if not better

## 2021-08-08 NOTE — Telephone Encounter (Signed)
Per Dr Janese Banks, call patient to see if he is still having diarrhea, if so send in prescription for azithromycin 500 mg daily for 3 days; if diarrhea no better after 3 days, he should call and we will extend prescription. I called patient and got his voice mail and left message for him to please call me back regarding results

## 2021-08-11 ENCOUNTER — Telehealth: Payer: Self-pay | Admitting: *Deleted

## 2021-08-11 MED ORDER — AZITHROMYCIN 500 MG PO TABS: 500.0000 mg | ORAL_TABLET | Freq: Every day | ORAL | 0 refills | Status: DC

## 2021-08-11 NOTE — Telephone Encounter (Signed)
Yes for 4 more days

## 2021-08-11 NOTE — Telephone Encounter (Signed)
yes

## 2021-08-11 NOTE — Telephone Encounter (Signed)
Patient called back as instructed and reports that he continues to have diarrhea, but not as bad. Do you want to extend his antibiotics Azithromycin as discussed 9/9?

## 2021-08-11 NOTE — Telephone Encounter (Signed)
He is asking if he should keep his appointment for treatment tomorrow.

## 2021-08-11 NOTE — Telephone Encounter (Signed)
Patient informed that doctor does want him to keep his appointment tomorrow. He has agreed to come and will pick up additional antibiotics sent to pharmacy as well

## 2021-08-12 ENCOUNTER — Inpatient Hospital Stay: Payer: Medicare HMO

## 2021-08-12 ENCOUNTER — Other Ambulatory Visit: Payer: Self-pay | Admitting: Oncology

## 2021-08-12 VITALS — BP 116/76 | HR 97 | Temp 97.2°F | Resp 16 | Wt 220.6 lb

## 2021-08-12 DIAGNOSIS — R197 Diarrhea, unspecified: Secondary | ICD-10-CM | POA: Diagnosis not present

## 2021-08-12 DIAGNOSIS — C8202 Follicular lymphoma grade I, intrathoracic lymph nodes: Secondary | ICD-10-CM

## 2021-08-12 DIAGNOSIS — Z5189 Encounter for other specified aftercare: Secondary | ICD-10-CM | POA: Diagnosis not present

## 2021-08-12 DIAGNOSIS — Z5111 Encounter for antineoplastic chemotherapy: Secondary | ICD-10-CM | POA: Diagnosis not present

## 2021-08-12 DIAGNOSIS — I10 Essential (primary) hypertension: Secondary | ICD-10-CM | POA: Diagnosis not present

## 2021-08-12 DIAGNOSIS — C8208 Follicular lymphoma grade I, lymph nodes of multiple sites: Secondary | ICD-10-CM | POA: Diagnosis not present

## 2021-08-12 DIAGNOSIS — F32A Depression, unspecified: Secondary | ICD-10-CM | POA: Diagnosis not present

## 2021-08-12 DIAGNOSIS — E785 Hyperlipidemia, unspecified: Secondary | ICD-10-CM | POA: Diagnosis not present

## 2021-08-12 DIAGNOSIS — R000 Tachycardia, unspecified: Secondary | ICD-10-CM | POA: Diagnosis not present

## 2021-08-12 LAB — CBC WITH DIFFERENTIAL/PLATELET
Abs Immature Granulocytes: 0.05 10*3/uL (ref 0.00–0.07)
Basophils Absolute: 0 10*3/uL (ref 0.0–0.1)
Basophils Relative: 1 %
Eosinophils Absolute: 0.1 10*3/uL (ref 0.0–0.5)
Eosinophils Relative: 2 %
HCT: 36.9 % — ABNORMAL LOW (ref 39.0–52.0)
Hemoglobin: 13.5 g/dL (ref 13.0–17.0)
Immature Granulocytes: 1 %
Lymphocytes Relative: 47 %
Lymphs Abs: 1.9 10*3/uL (ref 0.7–4.0)
MCH: 32.4 pg (ref 26.0–34.0)
MCHC: 36.6 g/dL — ABNORMAL HIGH (ref 30.0–36.0)
MCV: 88.5 fL (ref 80.0–100.0)
Monocytes Absolute: 0.7 10*3/uL (ref 0.1–1.0)
Monocytes Relative: 18 %
Neutro Abs: 1.2 10*3/uL — ABNORMAL LOW (ref 1.7–7.7)
Neutrophils Relative %: 31 %
Platelets: 149 10*3/uL — ABNORMAL LOW (ref 150–400)
RBC: 4.17 MIL/uL — ABNORMAL LOW (ref 4.22–5.81)
RDW: 14.9 % (ref 11.5–15.5)
WBC: 4 10*3/uL (ref 4.0–10.5)
nRBC: 0 % (ref 0.0–0.2)

## 2021-08-12 LAB — COMPREHENSIVE METABOLIC PANEL
ALT: 16 U/L (ref 0–44)
AST: 29 U/L (ref 15–41)
Albumin: 3.9 g/dL (ref 3.5–5.0)
Alkaline Phosphatase: 112 U/L (ref 38–126)
Anion gap: 9 (ref 5–15)
BUN: 8 mg/dL (ref 6–20)
CO2: 25 mmol/L (ref 22–32)
Calcium: 8.9 mg/dL (ref 8.9–10.3)
Chloride: 99 mmol/L (ref 98–111)
Creatinine, Ser: 0.84 mg/dL (ref 0.61–1.24)
GFR, Estimated: >60 mL/min (ref >60)
Glucose, Bld: 137 mg/dL — ABNORMAL HIGH (ref 70–99)
Potassium: 3.7 mmol/L (ref 3.5–5.1)
Sodium: 133 mmol/L — ABNORMAL LOW (ref 135–145)
Total Bilirubin: 0.8 mg/dL (ref 0.3–1.2)
Total Protein: 6.3 g/dL — ABNORMAL LOW (ref 6.5–8.1)

## 2021-08-12 MED ORDER — HEPARIN SOD (PORK) LOCK FLUSH 100 UNIT/ML IV SOLN
500.0000 [IU] | Freq: Once | INTRAVENOUS | Status: AC | PRN
  Filled 2021-08-12: qty 5

## 2021-08-12 MED ORDER — SODIUM CHLORIDE 0.9% FLUSH
10.0000 mL | INTRAVENOUS | Status: DC | PRN
  Administered 2021-08-12: 10 mL via INTRAVENOUS
  Filled 2021-08-12: qty 10

## 2021-08-12 MED ORDER — DIPHENHYDRAMINE HCL 50 MG/ML IJ SOLN
50.0000 mg | Freq: Once | INTRAMUSCULAR | Status: AC
  Administered 2021-08-12: 50 mg via INTRAVENOUS
  Filled 2021-08-12: qty 1

## 2021-08-12 MED ORDER — RITUXIMAB-PVVR CHEMO 500 MG/50ML IV SOLN
300.0000 mg | Freq: Once | INTRAVENOUS | Status: AC
  Administered 2021-08-12: 300 mg via INTRAVENOUS
  Filled 2021-08-12: qty 30

## 2021-08-12 MED ORDER — HEPARIN SOD (PORK) LOCK FLUSH 100 UNIT/ML IV SOLN
INTRAVENOUS | Status: AC
  Administered 2021-08-12: 500 [IU]
  Filled 2021-08-12: qty 5

## 2021-08-12 MED ORDER — SODIUM CHLORIDE 0.9 % IV SOLN
Freq: Once | INTRAVENOUS | Status: AC
  Filled 2021-08-12: qty 250

## 2021-08-12 MED ORDER — DEXAMETHASONE SODIUM PHOSPHATE 100 MG/10ML IJ SOLN
10.0000 mg | Freq: Once | INTRAMUSCULAR | Status: AC
  Administered 2021-08-12: 10 mg via INTRAVENOUS
  Filled 2021-08-12: qty 10

## 2021-08-12 MED ORDER — SODIUM CHLORIDE 0.9 % IV SOLN
200.0000 mg | Freq: Once | INTRAVENOUS | Status: AC
  Administered 2021-08-12: 200 mg via INTRAVENOUS
  Filled 2021-08-12: qty 8

## 2021-08-12 MED ORDER — ACETAMINOPHEN 325 MG PO TABS
650.0000 mg | ORAL_TABLET | Freq: Once | ORAL | Status: AC
  Administered 2021-08-12: 650 mg via ORAL
  Filled 2021-08-12: qty 2

## 2021-08-12 MED ORDER — FAMOTIDINE 20 MG IN NS 100 ML IVPB
20.0000 mg | Freq: Once | INTRAVENOUS | Status: AC
  Administered 2021-08-12: 20 mg via INTRAVENOUS
  Filled 2021-08-12: qty 20

## 2021-08-12 MED ORDER — MONTELUKAST SODIUM 10 MG PO TABS
10.0000 mg | ORAL_TABLET | Freq: Once | ORAL | Status: AC
  Administered 2021-08-12: 10 mg via ORAL
  Filled 2021-08-12: qty 1

## 2021-08-12 MED ORDER — PALONOSETRON HCL INJECTION 0.25 MG/5ML
0.2500 mg | Freq: Once | INTRAVENOUS | Status: AC
  Administered 2021-08-12: 0.25 mg via INTRAVENOUS
  Filled 2021-08-12: qty 5

## 2021-08-12 MED ORDER — HEPARIN SOD (PORK) LOCK FLUSH 100 UNIT/ML IV SOLN
500.0000 [IU] | Freq: Once | INTRAVENOUS | Status: DC
  Filled 2021-08-12: qty 5

## 2021-08-12 NOTE — Progress Notes (Signed)
Will adjust bendamustine dose down due to pt weight loss per MD

## 2021-08-12 NOTE — Progress Notes (Signed)
HR 109. Per Dr. Janese Banks, okay to proceed with treatment.

## 2021-08-12 NOTE — Patient Instructions (Addendum)
[facility]  Discharge Instructions: Thank you for choosing [facility] to provide your oncology and hematology care.  If you have a lab appointment with the [facility], please go directly to the [facility] and check in at the registration area.  Wear comfortable clothing and clothing appropriate for easy access to any Portacath or PICC line.   We strive to give you quality time with your provider. You may need to reschedule your appointment if you arrive late (15 or more minutes).  Arriving late affects you and other patients whose appointments are after yours.  Also, if you miss three or more appointments without notifying the office, you may be dismissed from the clinic at the provider's discretion.      For prescription refill requests, have your pharmacy contact our office and allow 72 hours for refills to be completed.    Today you received the following chemotherapy and/or immunotherapy agents Ruxience & Bendeka      To help prevent nausea and vomiting after your treatment, we encourage you to take your nausea medication as directed.  BELOW ARE SYMPTOMS THAT SHOULD BE REPORTED IMMEDIATELY: *FEVER GREATER THAN 100.4 F (38 C) OR HIGHER *CHILLS OR SWEATING *NAUSEA AND VOMITING THAT IS NOT CONTROLLED WITH YOUR NAUSEA MEDICATION *UNUSUAL SHORTNESS OF BREATH *UNUSUAL BRUISING OR BLEEDING *URINARY PROBLEMS (pain or burning when urinating, or frequent urination) *BOWEL PROBLEMS (unusual diarrhea, constipation, pain near the anus) TENDERNESS IN MOUTH AND THROAT WITH OR WITHOUT PRESENCE OF ULCERS (sore throat, sores in mouth, or a toothache) UNUSUAL RASH, SWELLING OR PAIN  UNUSUAL VAGINAL DISCHARGE OR ITCHING   Items with * indicate a potential emergency and should be followed up as soon as possible or go to the Emergency Department if any problems should occur.  Please show the CHEMOTHERAPY ALERT CARD or IMMUNOTHERAPY ALERT CARD at  check-in to the Emergency Department and triage nurse.  Should you have questions after your visit or need to cancel or reschedule your appointment, please contact [facility]  [phone] and follow the prompts.  Office hours are 8:00 a.m. to 4:30 p.m. Monday - Friday. Please note that voicemails left after 4:00 p.m. may not be returned until the following business day.  We are closed weekends and major holidays. You have access to a nurse at all times for urgent questions. Please call the main number to the clinic [phone] and follow the prompts.  For any non-urgent questions, you may also contact your provider using MyChart. We now offer e-Visits for anyone 26 and older to request care online for non-urgent symptoms. For details visit [email or web address].   Also download the MyChart app! Go to the app store, search "MyChart", open the app, select [facility], and log in with your MyChart username and password.  Due to Covid, a mask is required upon entering the hospital/clinic. If you do not have a mask, one will be given to you upon arrival. For doctor visits, patients may have 1 support person aged 56 or older with them. For treatment visits, patients cannot have anyone with them due to current Covid guidelines and our immunocompromised population.

## 2021-08-12 NOTE — Progress Notes (Addendum)
Cardiology Office Note:    Date:  08/18/2021   ID:  Russell Simpson, DOB June 04, 1961, MRN [id number]  PCP:  Pcp, No  [facility] Cardiologist:  Russell Sable, MD  [facility] Electrophysiologist:  None   Referring MD: Russell Cousins, MD   Chief Complaint: tachycardia follow-up  History of Present Illness:    Russell Simpson is a 60 y.o. male with a hx of HTN, asthma, tobacco use x15, lymphoma years who presents for follow-up. Patient was previously seen for syncopal episodes. Etiology suspected from cough. Cardiac monitor and echo were ordered. All syncopal episodes were related to coughing. He was referred to pulmonology. Cardiac monitor showed no significant arrhythmias, no high degree AV block. Echo showed EF 60-65%, impaired relaxation.   More recently he reports heart rate has been elevated. He has been told this multiple times at the cancer. He is undergoing chemo three sessions a week, He will do it for 6 months   Today, he was referred for sinus tachycardia. Today EKG shows heart rate of 98bpm. Looking back at prior EKGs it seems heart rate has been elevated for many years. He had stopped Toprol a while ago, but recently started it a couple months. He has no palpitations. No chest pain or shortness of breath, LLE, pnd, orthopnea.   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   Chronic pain    right arm   Depression    Environmental and seasonal allergies    Follicular lymphoma ([place])    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   left neck, Moh's 05/29/21   Hypertension    Pneumonia    Sleep apnea    does not wear CPAP ; uses humidifier instead   Squamous cell carcinoma of skin 02/19/2021   L inferior mandible, treated with [facility]    Past Surgical History:  Procedure Laterality  Date   ANTERIOR CERVICAL DECOMP/DISCECTOMY FUSION N/A 06/17/2016   Procedure: ANTERIOR CERVICAL DECOMPRESSION FUSION, CERVICAL 3-4, CERVICAL 4-5 WITH INSTRUMENTATION AND ALLOGRAFT;  Surgeon: Phylliss Bob, MD;  Location: [place];  Service: Orthopedics;  Laterality: N/A;  ANTERIOR CERVICAL DECOMPRESSION FUSION, CERVICAL 3-4, CERVICAL 4-5 WITH INSTRUMENTATION AND ALLOGRAFT   BACK SURGERY     NECK SURGERY  2009   PORTA CATH INSERTION N/A 04/03/2021   Procedure: PORTA CATH INSERTION;  Surgeon: Algernon Huxley, MD;  Location: [place] [facility];  Service: Cardiovascular;  Laterality: N/A;    Current Medications: Current Meds  Medication Sig   acetaminophen (TYLENOL) 325 MG tablet Take 2 tablets (650 mg total) by mouth daily. for 5 days starting 2 days prior to treatment   acyclovir (ZOVIRAX) 400 MG tablet TAKE 1 TABLET BY MOUTH TWICE DAILY   allopurinol (ZYLOPRIM) 300 MG tablet Take 1 tablet (300 mg total) by mouth daily.   amLODipine (NORVASC) 5 MG tablet Take 1 tablet (5 mg total) by mouth daily. Please schedule an office visit before anymore refills.   atorvastatin (LIPITOR) 40 MG tablet Take 1 tablet (40 mg total) by mouth daily.   azithromycin (ZITHROMAX) 500 MG tablet Take 1 tablet (500 mg total) by mouth daily.   buPROPion (WELLBUTRIN XL) 150 MG 24 hr tablet Take 1 tablet by mouth every morning.   clonazePAM (KLONOPIN) 0.5  MG tablet Take 0.5 mg by mouth daily.   DULoxetine (CYMBALTA) 60 MG capsule TAKE 1 CAPSULE EVERY DAY   famotidine (PEPCID) 20 MG tablet TAKE 1 TABLET TWICE DAILY   finasteride (PROSCAR) 5 MG tablet TAKE 1 TABLET BY MOUTH ONCE DAILY   HYDROcodone bit-homatropine (HYCODAN) 5-1.5 MG/5ML syrup Take 5 mLs by mouth every 4 (four) hours as needed for cough.   lansoprazole (PREVACID) 30 MG capsule TAKE 1 CAPSULE BY MOUTH ONCE DAILY AT NOON   lidocaine-prilocaine (EMLA) cream Apply to affected area once   loratadine (CLARITIN) 10 MG tablet Take 10 mg by mouth daily.    metoprolol  succinate (TOPROL-XL) 50 MG 24 hr tablet Take 1 tablet (50 mg total) by mouth daily. Take with or immediately following a meal.   mirtazapine (REMERON) 15 MG tablet Take 15 mg by mouth at bedtime.   modafinil (PROVIGIL) 200 MG tablet Take 200 mg by mouth daily.   mupirocin ointment (BACTROBAN) 2 % Apply 1 application topically 2 (two) times daily.   mupirocin ointment (BACTROBAN) 2 % Apply 1 application topically daily. With dressing changes   naltrexone (DEPADE) 50 MG tablet    OLANZapine (ZYPREXA) 7.5 MG tablet Take 7.5 mg by mouth at bedtime.   ondansetron (ZOFRAN) 8 MG tablet Take 1 tablet (8 mg total) by mouth 2 (two) times daily as needed for refractory nausea / vomiting. Start on day 2 after bendamustine chemo.   predniSONE (DELTASONE) 50 MG tablet Take 1 tablet daily starting 2 days prior to treatment, no prednisone on the days of treatment, then take 1 tablet a day after chemo for 2 days with all treatments   prochlorperazine (COMPAZINE) 10 MG tablet TAKE 1 TABLET BY MOUTH EVERY 6 HOURS AS NEEDED NAUSEA AND VOMITING   sildenafil (REVATIO) 20 MG tablet TAKE 3-5 TABLETS BY MOUTH ONCE DAILY AS NEEDED FOR ERECTILE DYSFUNCTION   tacrolimus (PROTOPIC) 0.1 % ointment Apply 1 application topically 2 (two) times daily.   tadalafil (CIALIS) 20 MG tablet 1 tab 1 hour prior to intercourse   testosterone cypionate (DEPOTESTOSTERONE CYPIONATE) 200 MG/ML injection Inject 1 mL (200 mg total) into the muscle every 14 (fourteen) days. (Patient taking differently: Inject 200 mg into the muscle once a week.)   traMADol (ULTRAM) 50 MG tablet Take 50 mg by mouth every 6 (six) hours as needed.   triamcinolone (KENALOG) 0.1 % Apply to affected areas 1-2 times a day until rash improved. Avoid face, groin, underarms.   [DISCONTINUED] metoprolol succinate (TOPROL-XL) 25 MG 24 hr tablet Take 1 tablet (25 mg total) by mouth daily.     Allergies:   Penicillins, Tetanus toxoids, Lisinopril, Losartan, Tetanus toxoid,  Codeine, Doxycycline, and Ruxience [rituximab-pvvr]   Social History   Socioeconomic History   Marital status: Divorced    Spouse name: Not on file   Number of children: Not on file   Years of education: Not on file   Highest education level: Not on file  Occupational History   Not on file  Tobacco Use   Smoking status: Every Day    Types: Cigars   Smokeless tobacco: Never   Tobacco comments:    5-6 cigars a day  Vaping Use   Vaping Use: Former   Start date: 04/30/2017   Quit date: 11/30/2017  Substance and Sexual Activity   Alcohol use: Not Currently    Alcohol/week: 0.0 standard drinks   Drug use: No   Sexual activity: Not Currently  Other Topics Concern  Not on file  Social History Narrative   Not on file   Social Determinants of Health   Financial Resource Strain: Not on file  Food Insecurity: Not on file  Transportation Needs: Not on file  Physical Activity: Not on file  Stress: Not on file  Social Connections: Not on file     Family History: The patient's family history includes Diabetes in his mother and another family member.  ROS:   Please see the history of present illness.     All other systems reviewed and are negative.  EKGs/Labs/Other Studies Reviewed:    The following studies were reviewed today:  Echo 07/2020  1. Left ventricular ejection fraction, by estimation, is 60 to 65%. The  left ventricle has normal function. The left ventricle has no regional  wall motion abnormalities. Left ventricular diastolic parameters are  consistent with Grade I diastolic  dysfunction (impaired relaxation). The average left ventricular global  longitudinal strain is -14.0 %. The global longitudinal strain is normal.   2. Right ventricular systolic function is normal. The right ventricular  size is normal. Tricuspid regurgitation signal is inadequate for assessing  PA pressure.   Heart monitor 06/2020 Occasional paroxysmal SVT noted. No significant  arrhythmias, heart blocks to account for syncope or collapse noted. Overall benign cardiac monitor.   EKG:  EKG is  ordered today.  The ekg ordered today demonstrates SR, 98bpm, nonspecific T wave changes  Recent Labs: 05/14/2021: B Natriuretic Peptide 11.4 05/20/2021: Magnesium 1.9 08/12/2021: ALT 16; BUN 8; Creatinine, Ser 0.84; Hemoglobin 13.5; Platelets 149; Potassium 3.7; Sodium 133 08/15/2021: TSH 4.070  Recent Lipid Panel    Component Value Date/Time   CHOL 255 (H) 02/14/2019 1513   TRIG 155 (H) 02/14/2019 1513   HDL 53 02/14/2019 1513   CHOLHDL 4.8 02/14/2019 1513   CHOLHDL 2.6 10/15/2017 0906   LDLCALC 171 (H) 02/14/2019 1513   LDLCALC 108 (H) 10/15/2017 0906     Physical Exam:    VS:  BP 128/82 (BP Location: Left Arm, Patient Position: Sitting, Cuff Size: Normal)   Pulse 98   Ht '6\' 2"'$  (1.88 m)   Wt 222 lb (100.7 kg)   SpO2 97%   BMI 28.50 kg/m     Wt Readings from Last 3 Encounters:  08/15/21 222 lb (100.7 kg)  08/12/21 220 lb 9.6 oz (100.1 kg)  08/05/21 221 lb (100.2 kg)     GEN:  Well nourished, well developed in no acute distress HEENT: Normal NECK: No JVD; No carotid bruits LYMPHATICS: No lymphadenopathy CARDIAC: RRR, no murmurs, rubs, gallops RESPIRATORY:  Clear to auscultation without rales, wheezing or rhonchi  ABDOMEN: Soft, non-tender, non-distended MUSCULOSKELETAL:  No edema; No deformity  SKIN: Warm and dry NEUROLOGIC:  Alert and oriented x 3 PSYCHIATRIC:  Normal affect   ASSESSMENT:    1. Paroxysmal tachycardia (HCC)   2. Smoking   3. Essential hypertension   4. Hyperlipidemia, mixed    PLAN:    In order of problems listed above:  Tobacco use He is still smoking cigars, 5 a day. Also smokes marijuana 3-4 hits a day. Cessation encouraged.  Sinus tachycardia Prior EKGs back to 2017 show ST with rates up to 117. Also, he is tachycardic in the setting of chemotherapy. He restarted Toprol a couple months ago and it seems to have helped,  today heart rate 98bpm. Echo from 07/2020 showed LVEF 60-65%, no WMA, G1DD. He had recent labs. I will check a TSH. Will also check  a 2 week heart monitor. Increase Toprol to '50mg'$  daily. We will see him back after the monitor.  HTN Patient restarted amlodipine '5mg'$  daily and Toprol '25mg'$  dilay a couple months ago. Will increase Toprol as above.   HLD LDL 171. Continue statin. Can check lipids at follow-up  Disposition: Follow up in 4-6 week(s) with Md/APP    Signed, Russell Ambrocio Ninfa Meeker, PA-C  08/18/2021 9:00 AM    [facility]

## 2021-08-13 ENCOUNTER — Inpatient Hospital Stay: Payer: Medicare HMO

## 2021-08-13 VITALS — BP 124/84 | HR 97 | Temp 96.1°F | Resp 16

## 2021-08-13 DIAGNOSIS — C8202 Follicular lymphoma grade I, intrathoracic lymph nodes: Secondary | ICD-10-CM

## 2021-08-13 DIAGNOSIS — R197 Diarrhea, unspecified: Secondary | ICD-10-CM | POA: Diagnosis not present

## 2021-08-13 DIAGNOSIS — F32A Depression, unspecified: Secondary | ICD-10-CM | POA: Diagnosis not present

## 2021-08-13 DIAGNOSIS — C8208 Follicular lymphoma grade I, lymph nodes of multiple sites: Secondary | ICD-10-CM | POA: Diagnosis not present

## 2021-08-13 DIAGNOSIS — R000 Tachycardia, unspecified: Secondary | ICD-10-CM | POA: Diagnosis not present

## 2021-08-13 DIAGNOSIS — Z5111 Encounter for antineoplastic chemotherapy: Secondary | ICD-10-CM | POA: Diagnosis not present

## 2021-08-13 DIAGNOSIS — I10 Essential (primary) hypertension: Secondary | ICD-10-CM | POA: Diagnosis not present

## 2021-08-13 DIAGNOSIS — Z5189 Encounter for other specified aftercare: Secondary | ICD-10-CM | POA: Diagnosis not present

## 2021-08-13 DIAGNOSIS — E785 Hyperlipidemia, unspecified: Secondary | ICD-10-CM | POA: Diagnosis not present

## 2021-08-13 MED ORDER — ACETAMINOPHEN 325 MG PO TABS
650.0000 mg | ORAL_TABLET | Freq: Once | ORAL | Status: AC
  Administered 2021-08-13: 650 mg via ORAL
  Filled 2021-08-13: qty 2

## 2021-08-13 MED ORDER — HEPARIN SOD (PORK) LOCK FLUSH 100 UNIT/ML IV SOLN
500.0000 [IU] | Freq: Once | INTRAVENOUS | Status: AC | PRN
  Administered 2021-08-13: 500 [IU]
  Filled 2021-08-13: qty 5

## 2021-08-13 MED ORDER — DIPHENHYDRAMINE HCL 50 MG/ML IJ SOLN
50.0000 mg | Freq: Once | INTRAMUSCULAR | Status: AC
  Administered 2021-08-13: 50 mg via INTRAVENOUS
  Filled 2021-08-13: qty 1

## 2021-08-13 MED ORDER — SODIUM CHLORIDE 0.9 % IV SOLN
Freq: Once | INTRAVENOUS | Status: AC
  Filled 2021-08-13: qty 250

## 2021-08-13 MED ORDER — MONTELUKAST SODIUM 10 MG PO TABS
10.0000 mg | ORAL_TABLET | Freq: Once | ORAL | Status: AC
  Administered 2021-08-13: 10 mg via ORAL
  Filled 2021-08-13: qty 1

## 2021-08-13 MED ORDER — FAMOTIDINE 20 MG IN NS 100 ML IVPB
20.0000 mg | Freq: Once | INTRAVENOUS | Status: AC
  Administered 2021-08-13: 20 mg via INTRAVENOUS
  Filled 2021-08-13: qty 20

## 2021-08-13 MED ORDER — SODIUM CHLORIDE 0.9 % IV SOLN
300.0000 mg | Freq: Once | INTRAVENOUS | Status: AC
  Administered 2021-08-13: 300 mg via INTRAVENOUS
  Filled 2021-08-13: qty 30

## 2021-08-13 MED ORDER — SODIUM CHLORIDE 0.9 % IV SOLN
10.0000 mg | Freq: Once | INTRAVENOUS | Status: AC
  Administered 2021-08-13: 10 mg via INTRAVENOUS
  Filled 2021-08-13: qty 10

## 2021-08-13 MED ORDER — SODIUM CHLORIDE 0.9 % IV SOLN
84.0000 mg/m2 | Freq: Once | INTRAVENOUS | Status: AC
  Administered 2021-08-13: 200 mg via INTRAVENOUS
  Filled 2021-08-13: qty 8

## 2021-08-14 ENCOUNTER — Inpatient Hospital Stay: Payer: Medicare HMO

## 2021-08-14 ENCOUNTER — Other Ambulatory Visit: Payer: Self-pay | Admitting: Urology

## 2021-08-14 ENCOUNTER — Other Ambulatory Visit: Payer: Self-pay

## 2021-08-14 VITALS — BP 139/90 | HR 95 | Temp 96.3°F | Resp 17

## 2021-08-14 DIAGNOSIS — I10 Essential (primary) hypertension: Secondary | ICD-10-CM | POA: Diagnosis not present

## 2021-08-14 DIAGNOSIS — R000 Tachycardia, unspecified: Secondary | ICD-10-CM | POA: Diagnosis not present

## 2021-08-14 DIAGNOSIS — E785 Hyperlipidemia, unspecified: Secondary | ICD-10-CM | POA: Diagnosis not present

## 2021-08-14 DIAGNOSIS — F32A Depression, unspecified: Secondary | ICD-10-CM | POA: Diagnosis not present

## 2021-08-14 DIAGNOSIS — Z5189 Encounter for other specified aftercare: Secondary | ICD-10-CM | POA: Diagnosis not present

## 2021-08-14 DIAGNOSIS — R197 Diarrhea, unspecified: Secondary | ICD-10-CM | POA: Diagnosis not present

## 2021-08-14 DIAGNOSIS — C8202 Follicular lymphoma grade I, intrathoracic lymph nodes: Secondary | ICD-10-CM

## 2021-08-14 DIAGNOSIS — C8208 Follicular lymphoma grade I, lymph nodes of multiple sites: Secondary | ICD-10-CM | POA: Diagnosis not present

## 2021-08-14 DIAGNOSIS — Z5111 Encounter for antineoplastic chemotherapy: Secondary | ICD-10-CM | POA: Diagnosis not present

## 2021-08-14 MED ORDER — SODIUM CHLORIDE 0.9 % IV SOLN
300.0000 mg | Freq: Once | INTRAVENOUS | Status: AC
  Administered 2021-08-14: 300 mg via INTRAVENOUS
  Filled 2021-08-14: qty 30

## 2021-08-14 MED ORDER — SODIUM CHLORIDE 0.9 % IV SOLN
10.0000 mg | Freq: Once | INTRAVENOUS | Status: AC
  Administered 2021-08-14: 10 mg via INTRAVENOUS
  Filled 2021-08-14: qty 10

## 2021-08-14 MED ORDER — DIPHENHYDRAMINE HCL 50 MG/ML IJ SOLN
50.0000 mg | Freq: Once | INTRAMUSCULAR | Status: AC
  Administered 2021-08-14: 50 mg via INTRAVENOUS
  Filled 2021-08-14: qty 1

## 2021-08-14 MED ORDER — SODIUM CHLORIDE 0.9 % IV SOLN
Freq: Once | INTRAVENOUS | Status: AC
  Filled 2021-08-14: qty 250

## 2021-08-14 MED ORDER — FAMOTIDINE 20 MG IN NS 100 ML IVPB
20.0000 mg | Freq: Once | INTRAVENOUS | Status: AC
  Administered 2021-08-14: 20 mg via INTRAVENOUS
  Filled 2021-08-14: qty 20

## 2021-08-14 MED ORDER — ACETAMINOPHEN 325 MG PO TABS
650.0000 mg | ORAL_TABLET | Freq: Once | ORAL | Status: AC
  Administered 2021-08-14: 650 mg via ORAL
  Filled 2021-08-14: qty 2

## 2021-08-14 MED ORDER — PEGFILGRASTIM-CBQV 6 MG/0.6ML ~~LOC~~ SOSY
6.0000 mg | PREFILLED_SYRINGE | Freq: Once | SUBCUTANEOUS | Status: AC
  Administered 2021-08-14: 6 mg via SUBCUTANEOUS
  Filled 2021-08-14: qty 0.6

## 2021-08-14 MED ORDER — HEPARIN SOD (PORK) LOCK FLUSH 100 UNIT/ML IV SOLN
INTRAVENOUS | Status: AC
  Administered 2021-08-14: 500 [IU]
  Filled 2021-08-14: qty 5

## 2021-08-14 MED ORDER — HEPARIN SOD (PORK) LOCK FLUSH 100 UNIT/ML IV SOLN
500.0000 [IU] | Freq: Once | INTRAVENOUS | Status: AC | PRN
  Filled 2021-08-14: qty 5

## 2021-08-14 MED ORDER — MONTELUKAST SODIUM 10 MG PO TABS
10.0000 mg | ORAL_TABLET | Freq: Once | ORAL | Status: AC
  Administered 2021-08-14: 10 mg via ORAL
  Filled 2021-08-14: qty 1

## 2021-08-14 NOTE — Patient Instructions (Signed)
[facility]  Discharge Instructions: Thank you for choosing [facility] to provide your oncology and hematology care.  If you have a lab appointment with the [facility], please go directly to the [facility] and check in at the registration area.  Wear comfortable clothing and clothing appropriate for easy access to any Portacath or PICC line.   We strive to give you quality time with your provider. You may need to reschedule your appointment if you arrive late (15 or more minutes).  Arriving late affects you and other patients whose appointments are after yours.  Also, if you miss three or more appointments without notifying the office, you may be dismissed from the clinic at the provider's discretion.      For prescription refill requests, have your pharmacy contact our office and allow 72 hours for refills to be completed.    Today you received the following chemotherapy and/or immunotherapy agents Ruxience       To help prevent nausea and vomiting after your treatment, we encourage you to take your nausea medication as directed.  BELOW ARE SYMPTOMS THAT SHOULD BE REPORTED IMMEDIATELY: *FEVER GREATER THAN 100.4 F (38 C) OR HIGHER *CHILLS OR SWEATING *NAUSEA AND VOMITING THAT IS NOT CONTROLLED WITH YOUR NAUSEA MEDICATION *UNUSUAL SHORTNESS OF BREATH *UNUSUAL BRUISING OR BLEEDING *URINARY PROBLEMS (pain or burning when urinating, or frequent urination) *BOWEL PROBLEMS (unusual diarrhea, constipation, pain near the anus) TENDERNESS IN MOUTH AND THROAT WITH OR WITHOUT PRESENCE OF ULCERS (sore throat, sores in mouth, or a toothache) UNUSUAL RASH, SWELLING OR PAIN  UNUSUAL VAGINAL DISCHARGE OR ITCHING   Items with * indicate a potential emergency and should be followed up as soon as possible or go to the Emergency Department if any problems should occur.  Please show the CHEMOTHERAPY ALERT CARD or IMMUNOTHERAPY ALERT CARD at check-in  to the Emergency Department and triage nurse.  Should you have questions after your visit or need to cancel or reschedule your appointment, please contact [facility]  [phone] and follow the prompts.  Office hours are 8:00 a.m. to 4:30 p.m. Monday - Friday. Please note that voicemails left after 4:00 p.m. may not be returned until the following business day.  We are closed weekends and major holidays. You have access to a nurse at all times for urgent questions. Please call the main number to the clinic [phone] and follow the prompts.  For any non-urgent questions, you may also contact your provider using MyChart. We now offer e-Visits for anyone 38 and older to request care online for non-urgent symptoms. For details visit [email or web address].   Also download the MyChart app! Go to the app store, search "MyChart", open the app, select [facility], and log in with your MyChart username and password.  Due to Covid, a mask is required upon entering the hospital/clinic. If you do not have a mask, one will be given to you upon arrival. For doctor visits, patients may have 1 support person aged 34 or older with them. For treatment visits, patients cannot have anyone with them due to current Covid guidelines and our immunocompromised population.

## 2021-08-15 ENCOUNTER — Other Ambulatory Visit: Payer: Self-pay | Admitting: Family Medicine

## 2021-08-15 ENCOUNTER — Encounter: Payer: Self-pay | Admitting: Medical

## 2021-08-15 ENCOUNTER — Ambulatory Visit (INDEPENDENT_AMBULATORY_CARE_PROVIDER_SITE_OTHER): Payer: Medicare HMO

## 2021-08-15 ENCOUNTER — Ambulatory Visit (INDEPENDENT_AMBULATORY_CARE_PROVIDER_SITE_OTHER): Payer: Medicare HMO | Admitting: Medical

## 2021-08-15 VITALS — BP 128/82 | HR 98 | Ht 74.0 in | Wt 222.0 lb

## 2021-08-15 DIAGNOSIS — I479 Paroxysmal tachycardia, unspecified: Secondary | ICD-10-CM

## 2021-08-15 DIAGNOSIS — IMO0001 Reserved for inherently not codable concepts without codable children: Secondary | ICD-10-CM

## 2021-08-15 DIAGNOSIS — E782 Mixed hyperlipidemia: Secondary | ICD-10-CM | POA: Diagnosis not present

## 2021-08-15 DIAGNOSIS — F17200 Nicotine dependence, unspecified, uncomplicated: Secondary | ICD-10-CM

## 2021-08-15 DIAGNOSIS — I10 Essential (primary) hypertension: Secondary | ICD-10-CM

## 2021-08-15 MED ORDER — METOPROLOL SUCCINATE ER 50 MG PO TB24: 50.0000 mg | ORAL_TABLET | Freq: Every day | ORAL | 3 refills | Status: DC

## 2021-08-15 NOTE — Patient Instructions (Signed)
Medication Instructions:   Please START Toprol 50 mg daily  *If you need a refill on your cardiac medications before your next appointment, please call your pharmacy*  Lab Work: TSH LABS WILL APPEAR ON MYCHART, ABNORMAL RESULTS WILL BE CALLED  Testing/Procedures: Heart monitor (Zio patch) for 2 weeks (14 days)   Your physician has recommended that you wear a Zio monitor. This monitor is a medical device that records the heart's electrical activity. Doctors most often use these monitors to diagnose arrhythmias. Arrhythmias are problems with the speed or rhythm of the heartbeat. The monitor is a small device applied to your chest. You can wear one while you do your normal daily activities. While wearing this monitor if you have any symptoms to push the button and record what you felt. Once you have worn this monitor for the period of time provider prescribed (Usually 14 days), you will return the monitor device in the postage paid box. Once it is returned they will download the data collected and provide [place] with a report which the provider will then review and we will call you with those results. Important tips:  Avoid showering during the first 24 hours of wearing the monitor. Avoid excessive sweating to help maximize wear time. Do not submerge the device, no hot tubs, and no swimming pools. Keep any lotions or oils away from the patch. After 24 hours you may shower with the patch on. Take brief showers with your back facing the shower head.  Do not remove patch once it has been placed because that will interrupt data and decrease adhesive wear time. Push the button when you have any symptoms and write down what you were feeling. Once you have completed wearing your monitor, remove and place into box which has postage paid and place in your outgoing mailbox.  If for some reason you have misplaced your box then call our office and we can provide another box and/or mail it off for  you.   Follow-Up: At [facility], you and your health needs are our priority.  As part of our continuing mission to provide you with exceptional heart care, we have created designated Provider Care Teams.  These Care Teams include your primary Cardiologist (physician) and Advanced Practice Providers (APPs -  Physician Assistants and Nurse Practitioners) who all work together to provide you with the care you need, when you need it.   Your next appointment:   1 month(s)  The format for your next appointment:   In Person  Provider:   Cadence Kathlen Mody, PA-C

## 2021-08-16 LAB — TSH: TSH: 4.07 u[IU]/mL (ref 0.450–4.500)

## 2021-08-20 ENCOUNTER — Other Ambulatory Visit: Payer: Self-pay | Admitting: Family Medicine

## 2021-08-20 ENCOUNTER — Other Ambulatory Visit: Payer: Self-pay | Admitting: *Deleted

## 2021-08-20 ENCOUNTER — Telehealth: Payer: Self-pay | Admitting: *Deleted

## 2021-08-20 MED ORDER — DIPHENOXYLATE-ATROPINE 2.5-0.025 MG PO TABS: 1.0000 | ORAL_TABLET | Freq: Four times a day (QID) | ORAL | 0 refills | Status: DC | PRN

## 2021-08-20 NOTE — Telephone Encounter (Signed)
Called pt and asked about his diarrhea. He states that he had a diarrhea stool every 3 hours yest. None today. He is still in bed but not had any stools so far. Drinking 1 pedialyte a day and 32 oz. of water a day. I asked him if he felt like he needs fluids from the diarrhea. He said not today. I asked him if he wanted me to send in rx for diarrhea and he said yes. The order has been entered and pt knows he can get it at his pharmacy

## 2021-08-21 ENCOUNTER — Ambulatory Visit: Payer: Self-pay | Admitting: Urology

## 2021-08-25 ENCOUNTER — Other Ambulatory Visit: Payer: Medicare HMO

## 2021-08-25 ENCOUNTER — Encounter: Payer: Self-pay | Admitting: Internal Medicine

## 2021-08-25 ENCOUNTER — Encounter: Payer: Self-pay | Admitting: Urology

## 2021-08-25 ENCOUNTER — Other Ambulatory Visit: Payer: Self-pay

## 2021-08-25 ENCOUNTER — Ambulatory Visit (INDEPENDENT_AMBULATORY_CARE_PROVIDER_SITE_OTHER): Payer: Medicare HMO | Admitting: Internal Medicine

## 2021-08-25 VITALS — BP 113/81 | HR 112 | Temp 100.2°F | Ht 72.64 in | Wt 218.0 lb

## 2021-08-25 DIAGNOSIS — E291 Testicular hypofunction: Secondary | ICD-10-CM | POA: Diagnosis not present

## 2021-08-25 DIAGNOSIS — F4310 Post-traumatic stress disorder, unspecified: Secondary | ICD-10-CM | POA: Diagnosis not present

## 2021-08-25 DIAGNOSIS — E785 Hyperlipidemia, unspecified: Secondary | ICD-10-CM

## 2021-08-25 DIAGNOSIS — C8202 Follicular lymphoma grade I, intrathoracic lymph nodes: Secondary | ICD-10-CM

## 2021-08-25 DIAGNOSIS — R739 Hyperglycemia, unspecified: Secondary | ICD-10-CM | POA: Diagnosis not present

## 2021-08-25 NOTE — Progress Notes (Signed)
BP 113/81   Pulse (!) 112   Temp 100.2 F (37.9 C) (Oral)   Ht 6' 0.64" (1.845 m)   Wt 218 lb (98.9 kg)   SpO2 96%   BMI 29.05 kg/m    Subjective:    Patient ID: Russell Simpson, male    DOB: 01-16-1961, 60 y.o.   MRN: [id number]  Chief Complaint  Patient presents with   New Patient (Initial Visit)    HPI: Russell Simpson is a 60 y.o. male  Was diagnosed with SCC of the neck and was sent to the ER sec to OP failure of cellulitis tx, was IP x 7 days.  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 Janese Banks for such.   Persistent Tachycardia noted seen by cards had a holter monitor last syncope 11/2 months ago. Echo showed EF 60-65%, impaired relaxation.  Chief Complaint  Patient presents with   New Patient (Initial Visit)    Relevant past medical, surgical, family and social history reviewed and updated as indicated. Interim medical history since our last visit reviewed. Allergies and medications reviewed and updated.  Review of Systems  Constitutional:  Negative for activity change, appetite change, chills, fatigue and fever.  HENT:  Negative for congestion, ear discharge, ear pain and facial swelling.   Eyes:  Positive for pain and itching.  Respiratory:  Negative for cough, chest tightness, shortness of breath and wheezing.   Cardiovascular:  Negative for chest pain, palpitations and leg swelling.  Gastrointestinal:  Negative for abdominal distention, abdominal pain, blood in stool, constipation, diarrhea, nausea and vomiting.  Endocrine: Negative for polyuria.  Genitourinary:  Negative for difficulty urinating, dysuria, flank pain, frequency, hematuria and urgency.  Musculoskeletal:  Negative for arthralgias, gait problem, joint swelling and myalgias.  Skin:  Negative for color change, rash and wound.  Neurological:  Negative for dizziness, tremors, speech difficulty, weakness, light-headedness, numbness and headaches.  Hematological:  Does not  bruise/bleed easily.  Psychiatric/Behavioral:  Negative for agitation, confusion and decreased concentration.    Per HPI unless specifically indicated above     Objective:    BP 113/81   Pulse (!) 112   Temp 100.2 F (37.9 C) (Oral)   Ht 6' 0.64" (1.845 m)   Wt 218 lb (98.9 kg)   SpO2 96%   BMI 29.05 kg/m   Wt Readings from Last 3 Encounters:  08/25/21 218 lb (98.9 kg)  08/15/21 222 lb (100.7 kg)  08/12/21 220 lb 9.6 oz (100.1 kg)    Physical Exam Vitals and nursing note reviewed.  Constitutional:      General: He is not in acute distress.    Appearance: Normal appearance. He is not ill-appearing or diaphoretic.  HENT:     Head: Normocephalic and atraumatic.     Right Ear: Tympanic membrane and external ear normal. There is no impacted cerumen.     Left Ear: External ear normal.     Nose: No congestion or rhinorrhea.     Mouth/Throat:     Pharynx: No oropharyngeal exudate or posterior oropharyngeal erythema.  Eyes:     Conjunctiva/sclera: Conjunctivae normal.     Pupils: Pupils are equal, round, and reactive to light.  Cardiovascular:     Rate and Rhythm: Normal rate and regular rhythm.     Heart sounds: No murmur heard.   No friction rub. No gallop.  Pulmonary:     Effort: No respiratory distress.     Breath sounds:  No wheezing.  Abdominal:     General: Abdomen is flat. Bowel sounds are normal.     Palpations: Abdomen is soft. There is no mass.     Tenderness: There is no abdominal tenderness.  Musculoskeletal:     Cervical back: Normal range of motion and neck supple. No rigidity or tenderness.     Left lower leg: No edema.  Skin:    General: Skin is warm and dry.  Neurological:     Mental Status: He is alert.     Cranial Nerves: No cranial nerve deficit.     Sensory: No sensory deficit.     Motor: No weakness.     Coordination: Coordination normal.  Psychiatric:        Mood and Affect: Mood normal.        Behavior: Behavior normal.        Thought  Content: Thought content normal.        Judgment: Judgment normal.    Results for orders placed or performed in visit on 08/25/21  Hematocrit  Result Value Ref Range   Hematocrit 45.4 37.5 - 51.0 %  Testosterone  Result Value Ref Range   Testosterone 549 264 - 916 ng/dL        Current Outpatient Medications:    acetaminophen (TYLENOL) 325 MG tablet, Take 2 tablets (650 mg total) by mouth daily. for 5 days starting 2 days prior to treatment, Disp: 30 tablet, Rfl: 0   acyclovir (ZOVIRAX) 400 MG tablet, TAKE 1 TABLET BY MOUTH TWICE DAILY, Disp: 60 tablet, Rfl: 2   allopurinol (ZYLOPRIM) 300 MG tablet, Take 1 tablet (300 mg total) by mouth daily., Disp: 30 tablet, Rfl: 3   amLODipine (NORVASC) 5 MG tablet, Take 1 tablet (5 mg total) by mouth daily. Please schedule an office visit before anymore refills., Disp: 30 tablet, Rfl: 0   atorvastatin (LIPITOR) 40 MG tablet, Take 1 tablet (40 mg total) by mouth daily., Disp: 90 tablet, Rfl: 4   buPROPion (WELLBUTRIN XL) 150 MG 24 hr tablet, Take 1 tablet by mouth every morning., Disp: , Rfl:    clonazePAM (KLONOPIN) 0.5 MG tablet, Take 0.5 mg by mouth daily., Disp: , Rfl:    diphenoxylate-atropine (LOMOTIL) 2.5-0.025 MG tablet, Take 1 tablet by mouth 4 (four) times daily as needed for diarrhea or loose stools., Disp: 30 tablet, Rfl: 0   DULoxetine (CYMBALTA) 60 MG capsule, TAKE 1 CAPSULE EVERY DAY, Disp: 90 capsule, Rfl: 0   famotidine (PEPCID) 20 MG tablet, TAKE 1 TABLET TWICE DAILY, Disp: 180 tablet, Rfl: 0   finasteride (PROSCAR) 5 MG tablet, TAKE 1 TABLET BY MOUTH ONCE DAILY, Disp: 90 tablet, Rfl: 2   lansoprazole (PREVACID) 30 MG capsule, TAKE 1 CAPSULE BY MOUTH ONCE DAILY AT NOON, Disp: 30 capsule, Rfl: 2   loratadine (CLARITIN) 10 MG tablet, Take 10 mg by mouth daily. , Disp: , Rfl:    metoprolol succinate (TOPROL-XL) 50 MG 24 hr tablet, Take 1 tablet (50 mg total) by mouth daily. Take with or immediately following a meal., Disp: 90 tablet,  Rfl: 3   mirtazapine (REMERON) 15 MG tablet, Take 15 mg by mouth at bedtime., Disp: , Rfl:    modafinil (PROVIGIL) 200 MG tablet, Take 200 mg by mouth daily., Disp: , Rfl:    mupirocin ointment (BACTROBAN) 2 %, Apply 1 application topically 2 (two) times daily., Disp: 22 g, Rfl: 0   naltrexone (DEPADE) 50 MG tablet, , Disp: , Rfl:  OLANZapine (ZYPREXA) 7.5 MG tablet, Take 7.5 mg by mouth at bedtime., Disp: , Rfl:    ondansetron (ZOFRAN) 8 MG tablet, Take 1 tablet (8 mg total) by mouth 2 (two) times daily as needed for refractory nausea / vomiting. Start on day 2 after bendamustine chemo., Disp: 30 tablet, Rfl: 1   prochlorperazine (COMPAZINE) 10 MG tablet, TAKE 1 TABLET BY MOUTH EVERY 6 HOURS AS NEEDED NAUSEA AND VOMITING, Disp: 30 tablet, Rfl: 1   sildenafil (REVATIO) 20 MG tablet, TAKE 3-5 TABLETS BY MOUTH ONCE DAILY AS NEEDED FOR ERECTILE DYSFUNCTION, Disp: 30 tablet, Rfl: 0   tacrolimus (PROTOPIC) 0.1 % ointment, Apply 1 application topically 2 (two) times daily., Disp: , Rfl:    testosterone cypionate (DEPOTESTOSTERONE CYPIONATE) 200 MG/ML injection, Inject 1 mL (200 mg total) into the muscle every 14 (fourteen) days., Disp: 10 mL, Rfl: 0   traMADol (ULTRAM) 50 MG tablet, Take 50 mg by mouth every 6 (six) hours as needed., Disp: , Rfl:    triamcinolone (KENALOG) 0.1 %, Apply to affected areas 1-2 times a day until rash improved. Avoid face, groin, underarms., Disp: 80 g, Rfl: 1   diphenhydrAMINE (BENADRYL) 25 mg capsule, Take 1 capsule (25 mg total) by mouth daily for 1 dose. Take 1 capsule daily for 5 days starting 2 days prior to treatment (Patient not taking: Reported on 07/01/2021), Disp: 30 capsule, Rfl: 0   tadalafil (CIALIS) 20 MG tablet, 1 tab 1 hour prior to intercourse (Patient not taking: Reported on 08/25/2021), Disp: 10 tablet, Rfl: 0 No current facility-administered medications for this visit.  Facility-Administered Medications Ordered in Other Visits:    heparin lock flush 100  unit/mL, 500 Units, Intravenous, Once, Sindy Guadeloupe, MD   heparin lock flush 100 unit/mL, 500 Units, Intravenous, Once, Sindy Guadeloupe, MD   sodium chloride flush (NS) 0.9 % injection 10 mL, 10 mL, Intravenous, PRN, Sindy Guadeloupe, MD    Assessment & Plan:  Sinus tachycardia : Is on toprol xl 50 mg daily for such. Stable.   2. Lymphoma on Chemotherapy 3 times a week T / W/ T per pt x 2 weeeks then stop and start again. 2 more cyucles  Sees heme onc mx per such   3.  Hyperprolactinemia and history of a pituitary adenoma.  Diagnosis was in 2014s/p bromocriptine and cabergoline in 2018 . PRL wnl . Will need to fu with endocrinology for further mx.    4. 2 more spots of SCC ? 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    Follow up plan: Return in about 4 months (around 12/25/2021).  Labs next visit : CBC, CMP, FLP, HBA1C, TSH, PSA, urine microalbumin Labs 1 week prior to next visit.

## 2021-08-26 DIAGNOSIS — R739 Hyperglycemia, unspecified: Secondary | ICD-10-CM | POA: Insufficient documentation

## 2021-08-26 LAB — TESTOSTERONE: Testosterone: 549 ng/dL (ref 264–916)

## 2021-08-26 LAB — HEMATOCRIT: Hematocrit: 45.4 % (ref 37.5–51.0)

## 2021-08-27 ENCOUNTER — Encounter: Payer: Self-pay | Admitting: *Deleted

## 2021-08-28 ENCOUNTER — Encounter: Payer: Self-pay | Admitting: Dermatology

## 2021-08-28 ENCOUNTER — Ambulatory Visit (INDEPENDENT_AMBULATORY_CARE_PROVIDER_SITE_OTHER): Payer: Medicare HMO | Admitting: Dermatology

## 2021-08-28 ENCOUNTER — Other Ambulatory Visit: Payer: Self-pay

## 2021-08-28 DIAGNOSIS — L578 Other skin changes due to chronic exposure to nonionizing radiation: Secondary | ICD-10-CM | POA: Diagnosis not present

## 2021-08-28 DIAGNOSIS — D045 Carcinoma in situ of skin of trunk: Secondary | ICD-10-CM | POA: Diagnosis not present

## 2021-08-28 DIAGNOSIS — C44529 Squamous cell carcinoma of skin of other part of trunk: Secondary | ICD-10-CM

## 2021-08-28 DIAGNOSIS — C4492 Squamous cell carcinoma of skin, unspecified: Secondary | ICD-10-CM

## 2021-08-28 DIAGNOSIS — L570 Actinic keratosis: Secondary | ICD-10-CM | POA: Diagnosis not present

## 2021-08-28 DIAGNOSIS — D099 Carcinoma in situ, unspecified: Secondary | ICD-10-CM

## 2021-08-28 DIAGNOSIS — C44622 Squamous cell carcinoma of skin of right upper limb, including shoulder: Secondary | ICD-10-CM

## 2021-08-28 DIAGNOSIS — D485 Neoplasm of uncertain behavior of skin: Secondary | ICD-10-CM

## 2021-08-28 HISTORY — DX: Squamous cell carcinoma of skin, unspecified: C44.92

## 2021-08-28 NOTE — Patient Instructions (Addendum)
If you have any questions or concerns for your doctor, please call our main line at [phone] and press option 4 to reach your doctor's medical assistant. If no one answers, please leave a voicemail as directed and we will return your call as soon as possible. Messages left after 4 pm will be answered the following business day.   You may also send [place] a message via [place]. We typically respond to MyChart messages within 1-2 business days.  For prescription refills, please ask your pharmacy to contact our office. Our fax number is [phone].  If you have an urgent issue when the clinic is closed that cannot wait until the next business day, you can page your doctor at the number below.    Please note that while we do our best to be available for urgent issues outside of office hours, we are not available 24/7.   If you have an urgent issue and are unable to reach [place], you may choose to seek medical care at your doctor's office, retail clinic, urgent care center, or emergency room.  If you have a medical emergency, please immediately call 911 or go to the emergency department.  Pager Numbers  - Dr. Nehemiah Massed: [phone]  - Dr. Laurence Ferrari: [phone]  - Dr. Nicole Kindred: [phone]  In the event of inclement weather, please call our main line at [phone] for an update on the status of any delays or closures.  Dermatology Medication Tips: Please keep the boxes that topical medications come in in order to help keep track of the instructions about where and how to use these. Pharmacies typically print the medication instructions only on the boxes and not directly on the medication tubes.   If your medication is too expensive, please contact our office at [phone] option 4 or send [place] a message through [place].   We are unable to tell what your co-pay for medications will be in advance as this is different depending on your insurance coverage. However, we may be able to find a substitute  medication at lower cost or fill out paperwork to get insurance to cover a needed medication.   If a prior authorization is required to get your medication covered by your insurance company, please allow [place] 1-2 business days to complete this process.  Drug prices often vary depending on where the prescription is filled and some pharmacies may offer cheaper prices.  The website www.goodrx.com contains coupons for medications through different pharmacies. The prices here do not account for what the cost may be with help from insurance (it may be cheaper with your insurance), but the website can give you the price if you did not use any insurance.  - You can print the associated coupon and take it with your prescription to the pharmacy.  - You may also stop by our office during regular business hours and pick up a GoodRx coupon card.  - If you need your prescription sent electronically to a different pharmacy, notify our office through [facility] or by phone at [phone] option 4. Electrodesiccation and Curettage ("Scrape and Burn") Wound Care Instructions  Leave the original bandage on for 24 hours if possible.  If the bandage becomes soaked or soiled before that time, it is OK to remove it and examine the wound.  A small amount of post-operative bleeding is normal.  If excessive bleeding occurs, remove the bandage, place gauze over the site and apply continuous pressure (no peeking) over the area for 30 minutes. If  this does not work, please call our clinic as soon as possible or page your doctor if it is after hours.   Once a day, cleanse the wound with soap and water. It is fine to shower. If a thick crust develops you may use a Q-tip dipped into dilute hydrogen peroxide (mix 1:1 with water) to dissolve it.  Hydrogen peroxide can slow the healing process, so use it only as needed.    After washing, apply petroleum jelly (Vaseline) or an antibiotic ointment if your doctor prescribed one  for you, followed by a bandage.    For best healing, the wound should be covered with a layer of ointment at all times. If you are not able to keep the area covered with a bandage to hold the ointment in place, this may mean re-applying the ointment several times a day.  Continue this wound care until the wound has healed and is no longer open. It may take several weeks for the wound to heal and close.  Itching and mild discomfort is normal during the healing process.  If you have any discomfort, you can take Tylenol (acetaminophen) or ibuprofen as directed on the bottle. (Please do not take these if you have an allergy to them or cannot take them for another reason).  Some redness, tenderness and white or yellow material in the wound is normal healing.  If the area becomes very sore and red, or develops a thick yellow-green material (pus), it may be infected; please notify us.    Wound healing continues for up to one year following surgery. It is not unusual to experience pain in the scar from time to time during the interval.  If the pain becomes severe or the scar thickens, you should notify the office.    A slight amount of redness in a scar is expected for the first six months.  After six months, the redness will fade and the scar will soften and fade.  The color difference becomes less noticeable with time.  If there are any problems, return for a post-op surgery check at your earliest convenience.  To improve the appearance of the scar, you can use silicone scar gel, cream, or sheets (such as Mederma or Serica) every night for up to one year. These are available over the counter (without a prescription).  Please call our office at [phone] for any questions or concerns.

## 2021-08-28 NOTE — Progress Notes (Signed)
Follow-Up Visit   Subjective  Russell Simpson is a 60 y.o. male who presents for the following: Bx proven SCC (Of the R ant shoulder and L upper abdomen - patient is here today for [facility] ). He has other irritated skin lesions on his chest he would like checked today as well.    The following portions of the chart were reviewed this encounter and updated as appropriate:   Tobacco  Allergies  Meds  Problems  Med Hx  Surg Hx  Fam Hx      Review of Systems:  No other skin or systemic complaints except as noted in HPI or Assessment and Plan.  Objective  Well appearing patient in no apparent distress; mood and affect are within normal limits.  A focused examination was performed including the chest, face, ears, scalp, hands Relevant physical exam findings are noted in the Assessment and Plan.  L upper abdomen Pink biopsy site.  R ant shoulder Pink plaque  upper chest R of midline 0.6 cm Scaly firm pink papule.     upper chest L of midline x 1, chest x 9, R shoulder x 2, L dorsal hand x 2, R dorsal hand x 3 (17) Erythematous thin papules/macules with gritty scale.    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  Destruction of lesion Complexity: extensive   Destruction method: electrodesiccation and curettage   Informed consent: discussed and consent obtained   Timeout:  patient name, date of birth, surgical site, and procedure verified Procedure prep:  Patient was prepped and draped in usual sterile fashion Prep type:  Isopropyl alcohol Anesthesia: the lesion was anesthetized in a standard fashion   Anesthetic:  1% lidocaine w/ epinephrine 1-100,000 buffered w/ 8.4% NaHCO3 Curettage performed in three different directions: Yes   Electrodesiccation performed over the curetted area: Yes   Final wound size (cm):  1.7 Hemostasis achieved with:  pressure, aluminum chloride and electrodesiccation Outcome: patient tolerated procedure well with no  complications   Post-procedure details: sterile dressing applied and wound care instructions given   Dressing type: bandage and petrolatum    Bx proven -   Squamous cell carcinoma of skin R ant shoulder  Destruction of lesion Complexity: extensive   Destruction method: electrodesiccation and curettage   Informed consent: discussed and consent obtained   Timeout:  patient name, date of birth, surgical site, and procedure verified Procedure prep:  Patient was prepped and draped in usual sterile fashion Prep type:  Isopropyl alcohol Anesthesia: the lesion was anesthetized in a standard fashion   Anesthetic:  1% lidocaine w/ epinephrine 1-100,000 buffered w/ 8.4% NaHCO3 Curettage performed in three different directions: Yes   Electrodesiccation performed over the curetted area: Yes   Final wound size (cm):  2.1 Hemostasis achieved with:  pressure, aluminum chloride and electrodesiccation Outcome: patient tolerated procedure well with no complications   Post-procedure details: sterile dressing applied and wound care instructions given   Dressing type: bandage and petrolatum    Bx proven - Discussed excision vs ED&C including higher cure rate with excision. Patient prefers ED&C today.  Will monitor for signs of recurrence.  Neoplasm of uncertain behavior of skin upper chest R of midline  Skin / nail biopsy Type of biopsy: tangential   Informed consent: discussed and consent obtained   Timeout: patient name, date of birth, surgical site, and procedure verified   Procedure prep:  Patient was prepped and draped in usual sterile fashion Prep  type:  Isopropyl alcohol Anesthesia: the lesion was anesthetized in a standard fashion   Anesthetic:  1% lidocaine w/ epinephrine 1-100,000 buffered w/ 8.4% NaHCO3 Instrument used: flexible razor blade   Hemostasis achieved with: pressure, aluminum chloride and electrodesiccation   Outcome: patient tolerated procedure well   Post-procedure  details: sterile dressing applied and wound care instructions given   Dressing type: bandage and petrolatum    Specimen 1 - Surgical pathology Differential Diagnosis: D48.5 r/o SCC vs other  Check Margins: No  AK (actinic keratosis) (17) upper chest L of midline x 1, chest x 9, R shoulder x 2, L dorsal hand x 2, R dorsal hand x 3  Prior to procedure, discussed risks of blister formation, small wound, skin dyspigmentation, or rare scar following cryotherapy. Recommend Vaseline ointment to treated areas while healing.   Destruction of lesion - upper chest L of midline x 1, chest x 9, R shoulder x 2, L dorsal hand x 2, R dorsal hand x 3 Complexity: simple   Destruction method: cryotherapy   Informed consent: discussed and consent obtained   Timeout:  patient name, date of birth, surgical site, and procedure verified Lesion destroyed using liquid nitrogen: Yes   Region frozen until ice ball extended beyond lesion: Yes   Outcome: patient tolerated procedure well with no complications   Post-procedure details: wound care instructions given    Actinic Damage - chronic, secondary to cumulative UV radiation exposure/sun exposure over time - diffuse scaly erythematous macules with underlying dyspigmentation - Recommend daily broad spectrum sunscreen SPF 30+ to sun-exposed areas, reapply every 2 hours as needed.  - Recommend staying in the shade or wearing long sleeves, sun glasses (UVA+UVB protection) and wide brim hats (4-inch brim around the entire circumference of the hat). - Call for new or changing lesions.  Return for appointment as scheduled. For FBSE, AK f/u, possible ED&C, possible discussion of PDT to chest  I, Rudell Cobb, CMA, am acting as scribe for Forest Gleason, MD .  Documentation: I have reviewed the above documentation for accuracy and completeness, and I agree with the above.  Forest Gleason, MD

## 2021-09-01 ENCOUNTER — Telehealth: Payer: Self-pay | Admitting: Internal Medicine

## 2021-09-01 NOTE — Telephone Encounter (Signed)
Patient advised that Dr. Neomia Dear requested documention that confirms he is disabled and then she will be able to fill out forms requested prior.  He dropped off a social security disability insurance paper to be given to Dr. Neomia Dear.  He has asked to be called at [phone] when completed or if there are any questions.  Placed in provider's folder.  See notes below.  Previous notes- Patient came into office and picked up the tax paperwork, he is aware that we can not fill out paperwork at this time due to being a new patient.  Patient will bring the tax paperwork in to his appointment 08/25/2021.

## 2021-09-02 ENCOUNTER — Telehealth: Payer: Self-pay

## 2021-09-02 ENCOUNTER — Other Ambulatory Visit: Payer: Medicare HMO

## 2021-09-02 ENCOUNTER — Ambulatory Visit: Payer: Medicare HMO

## 2021-09-02 ENCOUNTER — Ambulatory Visit: Payer: Medicare HMO | Admitting: Nurse Practitioner

## 2021-09-02 NOTE — Telephone Encounter (Signed)
-----   Message from Alfonso Patten, MD sent at 09/02/2021  2:53 PM EDT ----- Skin , upper chest R of midline WELL DIFFERENTIATED SQUAMOUS CELL CARCINOMA --> ED&C at f/u appt (already planned extra time)  MAs please call. Thank you!

## 2021-09-03 ENCOUNTER — Other Ambulatory Visit: Payer: Self-pay | Admitting: Oncology

## 2021-09-03 ENCOUNTER — Ambulatory Visit: Payer: Medicare HMO

## 2021-09-03 DIAGNOSIS — I479 Paroxysmal tachycardia, unspecified: Secondary | ICD-10-CM | POA: Diagnosis not present

## 2021-09-03 DIAGNOSIS — C8202 Follicular lymphoma grade I, intrathoracic lymph nodes: Secondary | ICD-10-CM

## 2021-09-03 NOTE — Telephone Encounter (Signed)
Called patient to inform that paperwork was complete and ready to pick up, no answer, left a voicemail for patientto return my call.    O.

## 2021-09-03 NOTE — Telephone Encounter (Signed)
patient

## 2021-09-04 ENCOUNTER — Ambulatory Visit: Payer: Medicare HMO

## 2021-09-04 ENCOUNTER — Encounter: Payer: Self-pay | Admitting: Oncology

## 2021-09-05 ENCOUNTER — Telehealth: Payer: Self-pay | Admitting: Medical

## 2021-09-05 MED ORDER — METOPROLOL SUCCINATE ER 50 MG PO TB24: 100.0000 mg | ORAL_TABLET | Freq: Every day | ORAL | 3 refills | Status: DC

## 2021-09-05 NOTE — Telephone Encounter (Signed)
Cadence Ninfa Meeker, PA-C  09/05/2021  2:24 PM EDT     Heart monitor showed predominately NSR with average heart arte of 114bpm. I run NSVT lasting 7 beats, rare premature beats. Overall benign with no significant arrhythmias.  Heart is still high. Lets increase Toprol to 100mg  daily. Recommend monitor HR and BP at home.

## 2021-09-05 NOTE — Telephone Encounter (Signed)
I spoke with the patient regarding his heart monitor results. I have advised him of Cadence Kathlen Mody, PA's recommendations to: 1) increase metoprolol succinate to 100 mg once daily  2) monitor BP/ HR readings at home  The patient voices understanding and is agreeable. He advised he has enough of the metoprolol succinate 50 mg tablets to double up on this for the next 2 weeks until he sees Dr. Garen Lah back in the office.  I have asked that he bring his BP/ HR readings from home when he comes to that appointment.  The patient is agreeable.

## 2021-09-09 ENCOUNTER — Other Ambulatory Visit: Payer: Self-pay

## 2021-09-09 ENCOUNTER — Encounter: Payer: Self-pay | Admitting: Oncology

## 2021-09-09 ENCOUNTER — Inpatient Hospital Stay: Payer: Medicare HMO

## 2021-09-09 ENCOUNTER — Inpatient Hospital Stay (HOSPITAL_BASED_OUTPATIENT_CLINIC_OR_DEPARTMENT_OTHER): Payer: Medicare HMO | Admitting: Oncology

## 2021-09-09 ENCOUNTER — Inpatient Hospital Stay: Payer: Medicare HMO | Attending: Oncology

## 2021-09-09 VITALS — BP 110/80 | HR 107 | Temp 98.4°F | Resp 18 | Wt 226.7 lb

## 2021-09-09 VITALS — BP 130/86 | HR 95 | Resp 16

## 2021-09-09 DIAGNOSIS — Z888 Allergy status to other drugs, medicaments and biological substances status: Secondary | ICD-10-CM | POA: Insufficient documentation

## 2021-09-09 DIAGNOSIS — C8202 Follicular lymphoma grade I, intrathoracic lymph nodes: Secondary | ICD-10-CM

## 2021-09-09 DIAGNOSIS — C8204 Follicular lymphoma grade I, lymph nodes of axilla and upper limb: Secondary | ICD-10-CM | POA: Diagnosis not present

## 2021-09-09 DIAGNOSIS — E785 Hyperlipidemia, unspecified: Secondary | ICD-10-CM | POA: Insufficient documentation

## 2021-09-09 DIAGNOSIS — I472 Ventricular tachycardia, unspecified: Secondary | ICD-10-CM | POA: Insufficient documentation

## 2021-09-09 DIAGNOSIS — Z5112 Encounter for antineoplastic immunotherapy: Secondary | ICD-10-CM | POA: Diagnosis not present

## 2021-09-09 DIAGNOSIS — Z881 Allergy status to other antibiotic agents status: Secondary | ICD-10-CM | POA: Diagnosis not present

## 2021-09-09 DIAGNOSIS — Z5111 Encounter for antineoplastic chemotherapy: Secondary | ICD-10-CM

## 2021-09-09 DIAGNOSIS — Z833 Family history of diabetes mellitus: Secondary | ICD-10-CM | POA: Diagnosis not present

## 2021-09-09 DIAGNOSIS — F17210 Nicotine dependence, cigarettes, uncomplicated: Secondary | ICD-10-CM | POA: Insufficient documentation

## 2021-09-09 DIAGNOSIS — E291 Testicular hypofunction: Secondary | ICD-10-CM | POA: Diagnosis not present

## 2021-09-09 DIAGNOSIS — I10 Essential (primary) hypertension: Secondary | ICD-10-CM | POA: Insufficient documentation

## 2021-09-09 DIAGNOSIS — Z5181 Encounter for therapeutic drug level monitoring: Secondary | ICD-10-CM | POA: Diagnosis not present

## 2021-09-09 DIAGNOSIS — Z79620 Long term (current) use of immunosuppressive biologic: Secondary | ICD-10-CM | POA: Diagnosis not present

## 2021-09-09 DIAGNOSIS — Z86018 Personal history of other benign neoplasm: Secondary | ICD-10-CM | POA: Insufficient documentation

## 2021-09-09 DIAGNOSIS — R0789 Other chest pain: Secondary | ICD-10-CM | POA: Diagnosis not present

## 2021-09-09 DIAGNOSIS — R5383 Other fatigue: Secondary | ICD-10-CM | POA: Diagnosis not present

## 2021-09-09 DIAGNOSIS — Z880 Allergy status to penicillin: Secondary | ICD-10-CM | POA: Diagnosis not present

## 2021-09-09 DIAGNOSIS — Z885 Allergy status to narcotic agent status: Secondary | ICD-10-CM | POA: Insufficient documentation

## 2021-09-09 DIAGNOSIS — Z887 Allergy status to serum and vaccine status: Secondary | ICD-10-CM | POA: Insufficient documentation

## 2021-09-09 DIAGNOSIS — Z79899 Other long term (current) drug therapy: Secondary | ICD-10-CM | POA: Diagnosis not present

## 2021-09-09 DIAGNOSIS — Z5189 Encounter for other specified aftercare: Secondary | ICD-10-CM | POA: Diagnosis not present

## 2021-09-09 LAB — COMPREHENSIVE METABOLIC PANEL
ALT: 35 U/L (ref 0–44)
AST: 43 U/L — ABNORMAL HIGH (ref 15–41)
Albumin: 4.4 g/dL (ref 3.5–5.0)
Alkaline Phosphatase: 136 U/L — ABNORMAL HIGH (ref 38–126)
Anion gap: 9 (ref 5–15)
BUN: 8 mg/dL (ref 6–20)
CO2: 23 mmol/L (ref 22–32)
Calcium: 9.2 mg/dL (ref 8.9–10.3)
Chloride: 101 mmol/L (ref 98–111)
Creatinine, Ser: 0.96 mg/dL (ref 0.61–1.24)
GFR, Estimated: >60 mL/min (ref >60)
Glucose, Bld: 155 mg/dL — ABNORMAL HIGH (ref 70–99)
Potassium: 3.8 mmol/L (ref 3.5–5.1)
Sodium: 133 mmol/L — ABNORMAL LOW (ref 135–145)
Total Bilirubin: 0.5 mg/dL (ref 0.3–1.2)
Total Protein: 6.9 g/dL (ref 6.5–8.1)

## 2021-09-09 LAB — CBC WITH DIFFERENTIAL/PLATELET
Abs Immature Granulocytes: 0.1 10*3/uL — ABNORMAL HIGH (ref 0.00–0.07)
Basophils Absolute: 0 10*3/uL (ref 0.0–0.1)
Basophils Relative: 0 %
Eosinophils Absolute: 0 10*3/uL (ref 0.0–0.5)
Eosinophils Relative: 1 %
HCT: 38.3 % — ABNORMAL LOW (ref 39.0–52.0)
Hemoglobin: 14.1 g/dL (ref 13.0–17.0)
Immature Granulocytes: 2 %
Lymphocytes Relative: 25 %
Lymphs Abs: 1.5 10*3/uL (ref 0.7–4.0)
MCH: 33.7 pg (ref 26.0–34.0)
MCHC: 36.8 g/dL — ABNORMAL HIGH (ref 30.0–36.0)
MCV: 91.4 fL (ref 80.0–100.0)
Monocytes Absolute: 0.7 10*3/uL (ref 0.1–1.0)
Monocytes Relative: 12 %
Neutro Abs: 3.7 10*3/uL (ref 1.7–7.7)
Neutrophils Relative %: 60 %
Platelets: 178 10*3/uL (ref 150–400)
RBC: 4.19 MIL/uL — ABNORMAL LOW (ref 4.22–5.81)
RDW: 14.6 % (ref 11.5–15.5)
WBC: 6.1 10*3/uL (ref 4.0–10.5)
nRBC: 0 % (ref 0.0–0.2)

## 2021-09-09 MED ORDER — HEPARIN SOD (PORK) LOCK FLUSH 100 UNIT/ML IV SOLN
500.0000 [IU] | Freq: Once | INTRAVENOUS | Status: DC
  Filled 2021-09-09: qty 5

## 2021-09-09 MED ORDER — PALONOSETRON HCL INJECTION 0.25 MG/5ML
0.2500 mg | Freq: Once | INTRAVENOUS | Status: AC
  Administered 2021-09-09: 0.25 mg via INTRAVENOUS
  Filled 2021-09-09: qty 5

## 2021-09-09 MED ORDER — HEPARIN SOD (PORK) LOCK FLUSH 100 UNIT/ML IV SOLN
500.0000 [IU] | Freq: Once | INTRAVENOUS | Status: AC | PRN
  Filled 2021-09-09: qty 5

## 2021-09-09 MED ORDER — SODIUM CHLORIDE 0.9 % IV SOLN
84.0000 mg/m2 | Freq: Once | INTRAVENOUS | Status: AC
  Administered 2021-09-09: 200 mg via INTRAVENOUS
  Filled 2021-09-09: qty 8

## 2021-09-09 MED ORDER — FAMOTIDINE 20 MG IN NS 100 ML IVPB
20.0000 mg | Freq: Once | INTRAVENOUS | Status: AC
  Administered 2021-09-09: 20 mg via INTRAVENOUS
  Filled 2021-09-09: qty 20

## 2021-09-09 MED ORDER — DIPHENHYDRAMINE HCL 50 MG/ML IJ SOLN
50.0000 mg | Freq: Once | INTRAMUSCULAR | Status: AC
  Administered 2021-09-09: 50 mg via INTRAVENOUS
  Filled 2021-09-09: qty 1

## 2021-09-09 MED ORDER — SODIUM CHLORIDE 0.9% FLUSH
10.0000 mL | INTRAVENOUS | Status: DC | PRN
  Filled 2021-09-09: qty 10

## 2021-09-09 MED ORDER — SODIUM CHLORIDE 0.9 % IV SOLN
300.0000 mg | Freq: Once | INTRAVENOUS | Status: AC
  Administered 2021-09-09: 300 mg via INTRAVENOUS
  Filled 2021-09-09: qty 30

## 2021-09-09 MED ORDER — ACETAMINOPHEN 325 MG PO TABS
650.0000 mg | ORAL_TABLET | Freq: Once | ORAL | Status: AC
  Administered 2021-09-09: 650 mg via ORAL
  Filled 2021-09-09: qty 2

## 2021-09-09 MED ORDER — SODIUM CHLORIDE 0.9 % IV SOLN
Freq: Once | INTRAVENOUS | Status: AC
  Filled 2021-09-09: qty 250

## 2021-09-09 MED ORDER — SODIUM CHLORIDE 0.9% FLUSH
10.0000 mL | INTRAVENOUS | Status: DC | PRN
  Administered 2021-09-09: 10 mL via INTRAVENOUS
  Filled 2021-09-09: qty 10

## 2021-09-09 MED ORDER — SODIUM CHLORIDE 0.9 % IV SOLN
10.0000 mg | Freq: Once | INTRAVENOUS | Status: AC
  Administered 2021-09-09: 10 mg via INTRAVENOUS
  Filled 2021-09-09: qty 10

## 2021-09-09 MED ORDER — HEPARIN SOD (PORK) LOCK FLUSH 100 UNIT/ML IV SOLN
INTRAVENOUS | Status: AC
  Administered 2021-09-09: 500 [IU]
  Filled 2021-09-09: qty 5

## 2021-09-09 MED ORDER — MONTELUKAST SODIUM 10 MG PO TABS
10.0000 mg | ORAL_TABLET | Freq: Once | ORAL | Status: AC
  Administered 2021-09-09: 10 mg via ORAL
  Filled 2021-09-09: qty 1

## 2021-09-09 NOTE — Progress Notes (Signed)
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 [name]  He sustained a left anterior frontal sinus as well as supraorbital rim fracture on 11/21/2019 which was managed conservatively.  He then presented to the ER at [facility] with symptoms of right-sided chest wall pain this led to a CT PE which did not show any evidence of pulmonary embolism.  He was noted to have bilateral symmetric axillary adenopathy measuring up to 1.8 cm.  Scattered mediastinal adenopathy.  Right paratracheal node measuring up to 1.2 cm.  Prominent right costophrenic lymph node measuring up to 1 cm.  Findings are nonspecific but could be seen with lymphoma versus systemic inflammatory disease such as sarcoidosis.  Of note patient has had a prior CT scan for lung screening back in 2015 when he was not noted to have this adenopathy.    Repeat CT chest in August 2021 showed persistent mild nonbulky axillary and mediastinal adenopathy which had not changed significantly as compared to his prior CT in December 2020.  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   Repeat CT in March 2022 showed Progression and multistation adenopathy as well as significant splenomegaly measuring 20.4  cm as compared to 14.5 cm on CT scan September 2021.  CT scan showed multistation adenopathy with SUVs ranging between 6-8.9.  Patient underwent another core biopsy of right inguinal lymph node which was consistent with follicular lymphoma grade 1-2.  Bone marrow biopsy also showed moderate involvement with non-Hodgkin's B-cell lymphoma.  Baseline hepatitis B testing showed core antibody positivity.  Seen by GI and started on tenofovir   Patient had symptoms of nausea and sweating during cycle 1 of Rituxan which resolved with additional premedications.  He developed symptoms of itching over neck chest back and bilateral eyes with second dose of Rituxan early and had to get more premedications and was not rechallenged on the same day.  Patient subsequently received cycle 3 of Bendamustine Rituxan chemotherapy after Rituxan was given as a split dose over 3 days   Scans after 4 cycles of Bendamustine Rituxan chemotherapy showedSignificant improvement with therapy.  Lymphadenopathy in his neck and mediastinum resolved [place] 2.  Decrease in the splenic size and hypermetabolism.  No new lesions.  FDG accumulation within the skeleton presumably due to stimulatory effects of treatment.    Interval history-patient denies any diarrhea presently.  He drinks about 6 beers every day.  He has 3 other spots on his chest wall which are potentially concerning for squamous cell carcinoma and will require excision by dermatology soon.  ECOG PS- 1 Pain scale- 0   Review of systems- Review of Systems  Constitutional:  Positive for malaise/fatigue. Negative for chills, fever and weight loss.  HENT:  Negative for congestion, ear discharge and nosebleeds.   Eyes:  Negative for blurred vision.  Respiratory:  Negative for cough, hemoptysis, sputum production, shortness of breath and wheezing.   Cardiovascular:  Negative for chest pain, palpitations, orthopnea and claudication.  Gastrointestinal:  Negative for abdominal  pain, blood in stool, constipation, diarrhea, heartburn, melena, nausea and vomiting.  Genitourinary:  Negative for dysuria, flank pain, frequency, hematuria and urgency.  Musculoskeletal:  Negative for back pain, joint pain and myalgias.  Skin:  Negative for rash.  Neurological:  Negative for dizziness, tingling, focal weakness, seizures, weakness and headaches.  Endo/Heme/Allergies:  Does not bruise/bleed easily.  Psychiatric/Behavioral:  Negative for depression and suicidal ideas. The patient does not have insomnia.      Allergies  Allergen Reactions   Penicillins Anaphylaxis    Tolerates cefdinir, cephalexin and amoxicillin/clavulonate so true PCN allergy unlikely   Tetanus Toxoids Swelling   Lisinopril Cough   Losartan      Other reaction(s): Muscle Pain     Tetanus Toxoid    Codeine Nausea Only and Nausea And Vomiting   Doxycycline Rash   Ruxience [Rituximab-Pvvr] Rash    05/06/21 pt w/ worsening rash during Ruxience infusion.  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[place])    Arthritis    Asthma    Basal cell carcinoma 01/28/2021   R upper arm, [facility] 03/04/2021   Brain 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   Chronic pain    right arm   Depression    Environmental and seasonal allergies    Follicular lymphoma ([place])    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   left neck, Moh's 05/29/21   Hypertension    Pneumonia    SCC (squamous cell carcinoma) 08/28/2021   upper chest right of midline, EDC   Sleep apnea    does not wear CPAP ; uses humidifier instead   Squamous cell carcinoma of skin 02/19/2021   L inferior mandible, treated with EDC   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/28/2021   R ant shoulder - ED&C   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/28/2021   L upper abdomen - ED&C     Past Surgical History:  Procedure Laterality Date   ANTERIOR CERVICAL  DECOMP/DISCECTOMY FUSION N/A 06/17/2016   Procedure: ANTERIOR CERVICAL DECOMPRESSION FUSION, CERVICAL 3-4, CERVICAL 4-5 WITH INSTRUMENTATION AND ALLOGRAFT;  Surgeon: Phylliss Bob, MD;  Location: [facility];  Service: Orthopedics;  Laterality: N/A;  ANTERIOR CERVICAL DECOMPRESSION FUSION, CERVICAL 3-4, CERVICAL 4-5 WITH INSTRUMENTATION AND ALLOGRAFT   BACK SURGERY     NECK SURGERY  2009   PORTA CATH INSERTION N/A 04/03/2021   Procedure: PORTA CATH INSERTION;  Surgeon: Algernon Huxley, MD;  Location: [facility];  Service: Cardiovascular;  Laterality: N/A;    Social History   Socioeconomic History   Marital status: Divorced    Spouse name: Not on file   Number of children: Not on file   Years of education: Not on file   Highest education level: Not on file  Occupational History   Not on file  Tobacco Use   Smoking status: Every Day    Types: Cigars   Smokeless tobacco: Never   Tobacco comments:    5-6 cigars a day  Vaping Use   Vaping Use: Former   Start date: 04/30/2017   Quit date: 11/30/2017  Substance and Sexual Activity   Alcohol use: Not Currently    Alcohol/week: 0.0 standard drinks  Drug use: No   Sexual activity: Not Currently  Other Topics Concern   Not on file  Social History Narrative   Not on file   Social Determinants of Health   Financial Resource Strain: Not on file  Food Insecurity: Not on file  Transportation Needs: Not on file  Physical Activity: Not on file  Stress: Not on file  Social Connections: Not on file  Intimate Partner Violence: Not on file    Family History  Problem Relation Age of Onset   Diabetes Mother    Diabetes Other      Current Outpatient Medications:    acetaminophen (TYLENOL) 325 MG tablet, Take 2 tablets (650 mg total) by mouth daily. for 5 days starting 2 days prior to treatment, Disp: 30 tablet, Rfl: 0   acyclovir (ZOVIRAX) 400 MG tablet, TAKE 1 TABLET BY MOUTH TWICE DAILY, Disp: 60 tablet, Rfl: 2   allopurinol (ZYLOPRIM)  300 MG tablet, TAKE 1 TABLET BY MOUTH ONCE DAILY, Disp: 30 tablet, Rfl: 3   amLODipine (NORVASC) 5 MG tablet, Take 1 tablet (5 mg total) by mouth daily. Please schedule an office visit before anymore refills., Disp: 30 tablet, Rfl: 0   atorvastatin (LIPITOR) 40 MG tablet, Take 1 tablet (40 mg total) by mouth daily., Disp: 90 tablet, Rfl: 4   buPROPion (WELLBUTRIN XL) 150 MG 24 hr tablet, Take 1 tablet by mouth every morning., Disp: , Rfl:    clonazePAM (KLONOPIN) 0.5 MG tablet, Take 0.5 mg by mouth daily., Disp: , Rfl:    diphenhydrAMINE (BENADRYL) 25 mg capsule, Take 1 capsule (25 mg total) by mouth daily for 1 dose. Take 1 capsule daily for 5 days starting 2 days prior to treatment, Disp: 30 capsule, Rfl: 0   diphenoxylate-atropine (LOMOTIL) 2.5-0.025 MG tablet, Take 1 tablet by mouth 4 (four) times daily as needed for diarrhea or loose stools., Disp: 30 tablet, Rfl: 0   DULoxetine (CYMBALTA) 60 MG capsule, TAKE 1 CAPSULE EVERY DAY, Disp: 90 capsule, Rfl: 0   famotidine (PEPCID) 20 MG tablet, TAKE 1 TABLET TWICE DAILY, Disp: 180 tablet, Rfl: 0   finasteride (PROSCAR) 5 MG tablet, TAKE 1 TABLET BY MOUTH ONCE DAILY, Disp: 90 tablet, Rfl: 2   lansoprazole (PREVACID) 30 MG capsule, TAKE 1 CAPSULE BY MOUTH ONCE DAILY AT NOON, Disp: 30 capsule, Rfl: 2   loratadine (CLARITIN) 10 MG tablet, Take 10 mg by mouth daily. , Disp: , Rfl:    metoprolol succinate (TOPROL-XL) 50 MG 24 hr tablet, Take 2 tablets (100 mg total) by mouth daily. Take with or immediately following a meal., Disp: 90 tablet, Rfl: 3   mirtazapine (REMERON) 15 MG tablet, Take 15 mg by mouth at bedtime., Disp: , Rfl:    modafinil (PROVIGIL) 200 MG tablet, Take 200 mg by mouth daily., Disp: , Rfl:    mupirocin ointment (BACTROBAN) 2 %, Apply 1 application topically 2 (two) times daily., Disp: 22 g, Rfl: 0   naltrexone (DEPADE) 50 MG tablet, , Disp: , Rfl:    OLANZapine (ZYPREXA) 7.5 MG tablet, Take 7.5 mg by mouth at bedtime., Disp: , Rfl:     ondansetron (ZOFRAN) 8 MG tablet, Take 1 tablet (8 mg total) by mouth 2 (two) times daily as needed for refractory nausea / vomiting. Start on day 2 after bendamustine chemo., Disp: 30 tablet, Rfl: 1   prochlorperazine (COMPAZINE) 10 MG tablet, TAKE 1 TABLET BY MOUTH EVERY 6 HOURS AS NEEDED NAUSEA AND VOMITING, Disp: 30 tablet, Rfl: 1  sildenafil (REVATIO) 20 MG tablet, TAKE 3-5 TABLETS BY MOUTH ONCE DAILY AS NEEDED FOR ERECTILE DYSFUNCTION, Disp: 30 tablet, Rfl: 0   tacrolimus (PROTOPIC) 0.1 % ointment, Apply 1 application topically 2 (two) times daily., Disp: , Rfl:    testosterone cypionate (DEPOTESTOSTERONE CYPIONATE) 200 MG/ML injection, Inject 1 mL (200 mg total) into the muscle every 14 (fourteen) days., Disp: 10 mL, Rfl: 0   traMADol (ULTRAM) 50 MG tablet, Take 50 mg by mouth every 6 (six) hours as needed., Disp: , Rfl:    triamcinolone (KENALOG) 0.1 %, Apply to affected areas 1-2 times a day until rash improved. Avoid face, groin, underarms., Disp: 80 g, Rfl: 1   tadalafil (CIALIS) 20 MG tablet, 1 tab 1 hour prior to intercourse (Patient not taking: No sig reported), Disp: 10 tablet, Rfl: 0 No current facility-administered medications for this visit.  Facility-Administered Medications Ordered in Other Visits:    bendamustine (BENDEKA) 200 mg in sodium chloride 0.9 % 50 mL (3.4483 mg/mL) chemo infusion, 84 mg/m2 (Treatment Plan Recorded), Intravenous, Once, Sindy Guadeloupe, MD   heparin lock flush 100 UNIT/ML injection, , , ,    heparin lock flush 100 unit/mL, 500 Units, Intravenous, Once, Sindy Guadeloupe, MD   heparin lock flush 100 unit/mL, 500 Units, Intravenous, Once, Sindy Guadeloupe, MD   heparin lock flush 100 unit/mL, 500 Units, Intravenous, Once, Sindy Guadeloupe, MD   heparin lock flush 100 unit/mL, 500 Units, Intracatheter, Once PRN, Sindy Guadeloupe, MD   sodium chloride flush (NS) 0.9 % injection 10 mL, 10 mL, Intravenous, PRN, Sindy Guadeloupe, MD   sodium chloride flush (NS) 0.9 %  injection 10 mL, 10 mL, Intravenous, PRN, Sindy Guadeloupe, MD, 10 mL at 09/09/21 0827   sodium chloride flush (NS) 0.9 % injection 10 mL, 10 mL, Intracatheter, PRN, Sindy Guadeloupe, MD  Physical exam:  Vitals:   09/09/21 0854  BP: 110/80  Pulse: (!) 107  Resp: 18  Temp: 98.4 F (36.9 C)  TempSrc: Tympanic  SpO2: 97%  Weight: 226 lb 11.2 oz (102.8 kg)   Physical Exam Cardiovascular:     Rate and Rhythm: Normal rate and regular rhythm.     Heart sounds: Normal heart sounds.  Pulmonary:     Effort: Pulmonary effort is normal.     Breath sounds: Normal breath sounds.  Abdominal:     General: Bowel sounds are normal.     Palpations: Abdomen is soft.     Comments: No palpable splenomegaly  Lymphadenopathy:     Comments: No palpable cervical adenopathy  Skin:    General: Skin is warm and dry.  Neurological:     Mental Status: He is alert and oriented to person, place, and time.     CMP Latest Ref Rng & Units 09/09/2021  Glucose 70 - 99 mg/dL 155(H)  BUN 6 - 20 mg/dL 8  Creatinine 0.61 - 1.24 mg/dL 0.96  Sodium 135 - 145 mmol/L 133(L)  Potassium 3.5 - 5.1 mmol/L 3.8  Chloride 98 - 111 mmol/L 101  CO2 22 - 32 mmol/L 23  Calcium 8.9 - 10.3 mg/dL 9.2  Total Protein 6.5 - 8.1 g/dL 6.9  Total Bilirubin 0.3 - 1.2 mg/dL 0.5  Alkaline Phos 38 - 126 U/L 136(H)  AST 15 - 41 U/L 43(H)  ALT 0 - 44 U/L 35   CBC Latest Ref Rng & Units 09/09/2021  WBC 4.0 - 10.5 K/uL 6.1  Hemoglobin 13.0 - 17.0 g/dL 14.1  Hematocrit 39.0 - 52.0 % 38.3(L)  Platelets 150 - 400 K/uL 178    No images are attached to the encounter.  LONG TERM MONITOR (3-14 DAYS)  Result Date: 09/04/2021 Patch Wear Time:  12 days and 17 hours (2022-09-16T15:54:57-0400 to 2022-09-29T09:17:35-0400) Patient had a min HR of 78 bpm, max HR of 156 bpm, and avg HR of 114 bpm. Predominant underlying rhythm was Sinus Rhythm. 1 run of Ventricular Tachycardia occurred lasting 7 beats with a max rate of 135 bpm (avg 119 bpm).  Isolated SVEs were rare (<1.0%),  SVE Couplets were rare (<1.0%), and SVE Triplets were rare (<1.0%). Isolated VEs were rare (<1.0%), VE Couplets were rare (<1.0%), and no VE Triplets were present. Overall benign cardiac monitor with no significant arrhythmias.    Assessment and plan- Patient is a 60 y.o. male with stage IV follicular lymphoma here for on treatment assessment prior to cycle 6 of Bendamustine Rituxan chemotherapy  Counts okay to proceed with cycle 6 of Bendamustine Rituxan chemotherapy today.  He will receive BR tomorrow as well and Rituxan is being given as a split dose over 3 days given his prior infusion reactions.  This will be his last cycle of treatment.  He receives [place] on day 3 as well.  PET CT scan in about 2 weeks time and I will see him thereafter   Visit Diagnosis 1. 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 ([place])   2. Encounter for antineoplastic chemotherapy   3. Encounter for monitoring rituximab therapy      Dr. Randa Evens, MD, MPH [facility] at [facility] [phone] 09/09/2021 12:30 PM

## 2021-09-09 NOTE — Progress Notes (Signed)
HR 107 - ok to treat per Dr. Janese Banks

## 2021-09-09 NOTE — Patient Instructions (Signed)
[facility]  Discharge Instructions: Thank you for choosing [facility] to provide your oncology and hematology care.  If you have a lab appointment with the [facility], please go directly to the [facility] and check in at the registration area.  Wear comfortable clothing and clothing appropriate for easy access to any Portacath or PICC line.   We strive to give you quality time with your provider. You may need to reschedule your appointment if you arrive late (15 or more minutes).  Arriving late affects you and other patients whose appointments are after yours.  Also, if you miss three or more appointments without notifying the office, you may be dismissed from the clinic at the provider's discretion.      For prescription refill requests, have your pharmacy contact our office and allow 72 hours for refills to be completed.    Today you received the following chemotherapy and/or immunotherapy agents - rituxan, bendamustine      To help prevent nausea and vomiting after your treatment, we encourage you to take your nausea medication as directed.  BELOW ARE SYMPTOMS THAT SHOULD BE REPORTED IMMEDIATELY: *FEVER GREATER THAN 100.4 F (38 C) OR HIGHER *CHILLS OR SWEATING *NAUSEA AND VOMITING THAT IS NOT CONTROLLED WITH YOUR NAUSEA MEDICATION *UNUSUAL SHORTNESS OF BREATH *UNUSUAL BRUISING OR BLEEDING *URINARY PROBLEMS (pain or burning when urinating, or frequent urination) *BOWEL PROBLEMS (unusual diarrhea, constipation, pain near the anus) TENDERNESS IN MOUTH AND THROAT WITH OR WITHOUT PRESENCE OF ULCERS (sore throat, sores in mouth, or a toothache) UNUSUAL RASH, SWELLING OR PAIN  UNUSUAL VAGINAL DISCHARGE OR ITCHING   Items with * indicate a potential emergency and should be followed up as soon as possible or go to the Emergency Department if any problems should occur.  Please show the CHEMOTHERAPY ALERT CARD or IMMUNOTHERAPY ALERT CARD  at check-in to the Emergency Department and triage nurse.  Should you have questions after your visit or need to cancel or reschedule your appointment, please contact [facility]  [phone] and follow the prompts.  Office hours are 8:00 a.m. to 4:30 p.m. Monday - Friday. Please note that voicemails left after 4:00 p.m. may not be returned until the following business day.  We are closed weekends and major holidays. You have access to a nurse at all times for urgent questions. Please call the main number to the clinic [phone] and follow the prompts.  For any non-urgent questions, you may also contact your provider using MyChart. We now offer e-Visits for anyone 36 and older to request care online for non-urgent symptoms. For details visit [email or web address].   Also download the MyChart app! Go to the app store, search "MyChart", open the app, select [facility], and log in with your MyChart username and password.  Due to Covid, a mask is required upon entering the hospital/clinic. If you do not have a mask, one will be given to you upon arrival. For doctor visits, patients may have 1 support person aged 19 or older with them. For treatment visits, patients cannot have anyone with them due to current Covid guidelines and our immunocompromised population.   Rituximab Injection What is this medication? RITUXIMAB (ri TUX i mab) is a monoclonal antibody. It is used to treat certain types of cancer like non-Hodgkin lymphoma and chronic lymphocytic leukemia. It is also used to treat rheumatoid arthritis, granulomatosis with polyangiitis, microscopic polyangiitis, and pemphigus vulgaris. This medicine may be used for  other purposes; ask your health care provider or pharmacist if you have questions. COMMON BRAND NAME(S): RIABNI, Rituxan, RUXIENCE What should I tell my care team before I take this medication? They need to know if you have any of these  conditions: chest pain heart disease infection especially a viral infection such as chickenpox, cold sores, hepatitis B, or herpes immune system problems irregular heartbeat or rhythm kidney disease low blood counts (white cells, platelets, or red cells) lung disease recent or upcoming vaccine an unusual or allergic reaction to rituximab, other medicines, foods, dyes, or preservatives pregnant or trying to get pregnant breast-feeding How should I use this medication? This medicine is injected into a vein. It is given by a health care provider in a hospital or clinic setting. A special MedGuide will be given to you before each treatment. Be sure to read this information carefully each time. Talk to your health care provider about the use of this medicine in children. While this drug may be prescribed for children as young as 6 months for selected conditions, precautions do apply. Overdosage: If you think you have taken too much of this medicine contact a poison control center or emergency room at once. NOTE: This medicine is only for you. Do not share this medicine with others. What if I miss a dose? Keep appointments for follow-up doses. It is important not to miss your dose. Call your health care provider if you are unable to keep an appointment. What may interact with this medication? Do not take this medicine with any of the following medicines: live vaccines This medicine may also interact with the following medicines: cisplatin This list may not describe all possible interactions. Give your health care provider a list of all the medicines, herbs, non-prescription drugs, or dietary supplements you use. Also tell them if you smoke, drink alcohol, or use illegal drugs. Some items may interact with your medicine. What should I watch for while using this medication? Your condition will be monitored carefully while you are receiving this medicine. You may need blood work done while you are  taking this medicine. This medicine can cause serious infusion reactions. To reduce the risk your health care provider may give you other medicines to take before receiving this one. Be sure to follow the directions from your health care provider. This medicine may increase your risk of getting an infection. Call your health care provider for advice if you get a fever, chills, sore throat, or other symptoms of a cold or flu. Do not treat yourself. Try to avoid being around people who are sick. Call your health care provider if you are around anyone with measles, chickenpox, or if you develop sores or blisters that do not heal properly. Avoid taking medicines that contain aspirin, acetaminophen, ibuprofen, naproxen, or ketoprofen unless instructed by your health care provider. These medicines may hide a fever. This medicine may cause serious skin reactions. They can happen weeks to months after starting the medicine. Contact your health care provider right away if you notice fevers or flu-like symptoms with a rash. The rash may be red or purple and then turn into blisters or peeling of the skin. Or, you might notice a red rash with swelling of the face, lips or lymph nodes in your neck or under your arms. In some patients, this medicine may cause a serious brain infection that may cause death. If you have any problems seeing, thinking, speaking, walking, or standing, tell your healthcare professional right away.  If you cannot reach your healthcare professional, urgently seek other source of medical care. Do not become pregnant while taking this medicine or for at least 12 months after stopping it. Women should inform their health care provider if they wish to become pregnant or think they might be pregnant. There is potential for serious harm to an unborn child. Talk to your health care provider for more information. Women should use a reliable form of birth control while taking this medicine and for 12 months  after stopping it. Do not breast-feed while taking this medicine or for at least 6 months after stopping it. What side effects may I notice from receiving this medication? Side effects that you should report to your health care provider as soon as possible: allergic reactions (skin rash, itching or hives; swelling of the face, lips, or tongue) diarrhea edema (sudden weight gain; swelling of the ankles, feet, hands or other unusual swelling; trouble breathing) fast, irregular heartbeat heart attack (trouble breathing; pain or tightness in the chest, neck, back or arms; unusually weak or tired) infection (fever, chills, cough, sore throat, pain or trouble passing urine) kidney injury (trouble passing urine or change in the amount of urine) liver injury (dark yellow or brown urine; general ill feeling or flu-like symptoms; loss of appetite, right upper belly pain; unusually weak or tired, yellowing of the eyes or skin) low blood pressure (dizziness; feeling faint or lightheaded, falls; unusually weak or tired) low red blood cell counts (trouble breathing; feeling faint; lightheaded, falls; unusually weak or tired) mouth sores redness, blistering, peeling, or loosening of the skin, including inside the mouth stomach pain unusual bruising or bleeding wheezing (trouble breathing with loud or whistling sounds) vomiting Side effects that usually do not require medical attention (report to your health care provider if they continue or are bothersome): headache joint pain muscle cramps, pain nausea This list may not describe all possible side effects. Call your doctor for medical advice about side effects. You may report side effects to FDA at 1-800-FDA-1088. Where should I keep my medication? This medicine is given in a hospital or clinic. It will not be stored at home. NOTE: This sheet is a summary. It may not cover all possible information. If you have questions about this medicine, talk to your  doctor, pharmacist, or health care provider.  2022 Elsevier/Gold Standard (2020-11-07 15:47:26) Bendamustine Injection What is this medication? BENDAMUSTINE (BEN da MUS teen) is a chemotherapy drug. It is used to treat chronic lymphocytic leukemia and non-Hodgkin lymphoma. This medicine may be used for other purposes; ask your health care provider or pharmacist if you have questions. COMMON BRAND NAME(S): Kristine Royal, Treanda What should I tell my care team before I take this medication? They need to know if you have any of these conditions: infection (especially a virus infection such as chickenpox, cold sores, or herpes) kidney disease liver disease an unusual or allergic reaction to bendamustine, mannitol, other medicines, foods, dyes, or preservatives pregnant or trying to get pregnant breast-feeding How should I use this medication? This medicine is for infusion into a vein. It is given by a health care professional in a hospital or clinic setting. Talk to your pediatrician regarding the use of this medicine in children. Special care may be needed. Overdosage: If you think you have taken too much of this medicine contact a poison control center or emergency room at once. NOTE: This medicine is only for you. Do not share this medicine with others. What if  I miss a dose? It is important not to miss your dose. Call your doctor or health care professional if you are unable to keep an appointment. What may interact with this medication? Do not take this medicine with any of the following medications: clozapine This medicine may also interact with the following medications: atazanavir cimetidine ciprofloxacin enoxacin fluvoxamine medicines for seizures like carbamazepine and phenobarbital mexiletine rifampin tacrine thiabendazole zileuton This list may not describe all possible interactions. Give your health care provider a list of all the medicines, herbs, non-prescription  drugs, or dietary supplements you use. Also tell them if you smoke, drink alcohol, or use illegal drugs. Some items may interact with your medicine. What should I watch for while using this medication? This drug may make you feel generally unwell. This is not uncommon, as chemotherapy can affect healthy cells as well as cancer cells. Report any side effects. Continue your course of treatment even though you feel ill unless your doctor tells you to stop. You may need blood work done while you are taking this medicine. Call your doctor or healthcare provider for advice if you get a fever, chills or sore throat, or other symptoms of a cold or flu. Do not treat yourself. This drug decreases your body's ability to fight infections. Try to avoid being around people who are sick. This medicine may cause serious skin reactions. They can happen weeks to months after starting the medicine. Contact your healthcare provider right away if you notice fevers or flu-like symptoms with a rash. The rash may be red or purple and then turn into blisters or peeling of the skin. Or, you might notice a red rash with swelling of the face, lips or lymph nodes in your neck or under your arms. In some patients, this medicine may cause a serious brain infection that may cause death. If you have any problems seeing, thinking, speaking, walking, or standing, tell your health care provider right away. If you cannot reach your health care provider, urgently seek other source of medical care. This medicine may increase your risk to bruise or bleed. Call your doctor or healthcare provider if you notice any unusual bleeding. Talk to your doctor about your risk of cancer. You may be more at risk for certain types of cancers if you take this medicine. This medicine may increase your risk of skin cancer. Check your skin for changes to moles or for new growths while taking this medicine. Call your health care provider if you notice any of these  skin changes. Do not become pregnant while taking this medicine or for at least 6 months after stopping it. Women should inform their doctor if they wish to become pregnant or think they might be pregnant. Men should not father a child while taking this medicine and for at least 3 months after stopping it. There is a potential for serious side effects to an unborn child. Talk to your healthcare provider or pharmacist for more information. Do not breast-feed an infant while taking this medicine or for at least 1 week after stopping it. This medicine may make it more difficult to father a child. You should talk with your doctor or healthcare provider if you are concerned about your fertility. What side effects may I notice from receiving this medication? Side effects that you should report to your doctor or health care professional as soon as possible: allergic reactions like skin rash, itching or hives, swelling of the face, lips, or tongue low blood  counts - this medicine may decrease the number of white blood cells, red blood cells and platelets. You may be at increased risk for infections and bleeding. rash, fever, and swollen lymph nodes redness, blistering, peeling, or loosening of the skin, including inside the mouth signs of infection like fever or chills, cough, sore throat, pain or difficulty passing urine signs of decreased platelets or bleeding like bruising, pinpoint red spots on the skin, black, tarry stools, blood in the urine signs of decreased red blood cells like being unusually weak or tired, fainting spells, lightheadedness signs and symptoms of kidney injury like trouble passing urine or change in the amount of urine signs and symptoms of liver injury like dark yellow or brown urine; general ill feeling or flu-like symptoms; light-colored stools; loss of appetite; nausea; right upper belly pain; unusually weak or tired; yellowing of the eyes or skin Side effects that usually do not  require medical attention (report to your doctor or health care professional if they continue or are bothersome): constipation decreased appetite diarrhea headache mouth sores nausea, vomiting tiredness This list may not describe all possible side effects. Call your doctor for medical advice about side effects. You may report side effects to FDA at 1-800-FDA-1088. Where should I keep my medication? This drug is given in a hospital or clinic and will not be stored at home. NOTE: This sheet is a summary. It may not cover all possible information. If you have questions about this medicine, talk to your doctor, pharmacist, or health care provider.  2022 [employer]vier/Gold Standard (2020-05-13 12:11:43)

## 2021-09-10 ENCOUNTER — Inpatient Hospital Stay: Payer: Medicare HMO

## 2021-09-10 VITALS — BP 137/91 | HR 94 | Temp 97.9°F | Resp 20

## 2021-09-10 DIAGNOSIS — E291 Testicular hypofunction: Secondary | ICD-10-CM | POA: Diagnosis not present

## 2021-09-10 DIAGNOSIS — Z5111 Encounter for antineoplastic chemotherapy: Secondary | ICD-10-CM | POA: Diagnosis not present

## 2021-09-10 DIAGNOSIS — E785 Hyperlipidemia, unspecified: Secondary | ICD-10-CM | POA: Diagnosis not present

## 2021-09-10 DIAGNOSIS — I10 Essential (primary) hypertension: Secondary | ICD-10-CM | POA: Diagnosis not present

## 2021-09-10 DIAGNOSIS — R0789 Other chest pain: Secondary | ICD-10-CM | POA: Diagnosis not present

## 2021-09-10 DIAGNOSIS — R5383 Other fatigue: Secondary | ICD-10-CM | POA: Diagnosis not present

## 2021-09-10 DIAGNOSIS — C8204 Follicular lymphoma grade I, lymph nodes of axilla and upper limb: Secondary | ICD-10-CM | POA: Diagnosis not present

## 2021-09-10 DIAGNOSIS — C8202 Follicular lymphoma grade I, intrathoracic lymph nodes: Secondary | ICD-10-CM

## 2021-09-10 DIAGNOSIS — Z5112 Encounter for antineoplastic immunotherapy: Secondary | ICD-10-CM | POA: Diagnosis not present

## 2021-09-10 DIAGNOSIS — Z5189 Encounter for other specified aftercare: Secondary | ICD-10-CM | POA: Diagnosis not present

## 2021-09-10 MED ORDER — SODIUM CHLORIDE 0.9% FLUSH
10.0000 mL | INTRAVENOUS | Status: DC | PRN
  Administered 2021-09-10: 10 mL
  Filled 2021-09-10: qty 10

## 2021-09-10 MED ORDER — SODIUM CHLORIDE 0.9 % IV SOLN
10.0000 mg | Freq: Once | INTRAVENOUS | Status: AC
  Administered 2021-09-10: 10 mg via INTRAVENOUS
  Filled 2021-09-10: qty 10

## 2021-09-10 MED ORDER — ACETAMINOPHEN 325 MG PO TABS
650.0000 mg | ORAL_TABLET | Freq: Once | ORAL | Status: AC
  Administered 2021-09-10: 650 mg via ORAL
  Filled 2021-09-10: qty 2

## 2021-09-10 MED ORDER — FAMOTIDINE 20 MG IN NS 100 ML IVPB
20.0000 mg | Freq: Once | INTRAVENOUS | Status: AC
  Administered 2021-09-10: 20 mg via INTRAVENOUS
  Filled 2021-09-10: qty 20

## 2021-09-10 MED ORDER — DIPHENHYDRAMINE HCL 50 MG/ML IJ SOLN
50.0000 mg | Freq: Once | INTRAMUSCULAR | Status: AC
  Administered 2021-09-10: 50 mg via INTRAVENOUS
  Filled 2021-09-10: qty 1

## 2021-09-10 MED ORDER — SODIUM CHLORIDE 0.9 % IV SOLN
Freq: Once | INTRAVENOUS | Status: AC
  Filled 2021-09-10: qty 250

## 2021-09-10 MED ORDER — HEPARIN SOD (PORK) LOCK FLUSH 100 UNIT/ML IV SOLN
500.0000 [IU] | Freq: Once | INTRAVENOUS | Status: AC | PRN
  Administered 2021-09-10: 500 [IU]
  Filled 2021-09-10: qty 5

## 2021-09-10 MED ORDER — SODIUM CHLORIDE 0.9 % IV SOLN
84.0000 mg/m2 | Freq: Once | INTRAVENOUS | Status: AC
  Administered 2021-09-10: 200 mg via INTRAVENOUS
  Filled 2021-09-10: qty 8

## 2021-09-10 MED ORDER — SODIUM CHLORIDE 0.9 % IV SOLN
300.0000 mg | Freq: Once | INTRAVENOUS | Status: AC
  Administered 2021-09-10: 300 mg via INTRAVENOUS
  Filled 2021-09-10: qty 30

## 2021-09-10 MED ORDER — MONTELUKAST SODIUM 10 MG PO TABS
10.0000 mg | ORAL_TABLET | Freq: Once | ORAL | Status: AC
  Administered 2021-09-10: 10 mg via ORAL
  Filled 2021-09-10: qty 1

## 2021-09-10 NOTE — Patient Instructions (Signed)
[facility]  Discharge Instructions: Thank you for choosing [facility] to provide your oncology and hematology care.  If you have a lab appointment with the [facility], please go directly to the [facility] and check in at the registration area.  Wear comfortable clothing and clothing appropriate for easy access to any Portacath or PICC line.   We strive to give you quality time with your provider. You may need to reschedule your appointment if you arrive late (15 or more minutes).  Arriving late affects you and other patients whose appointments are after yours.  Also, if you miss three or more appointments without notifying the office, you may be dismissed from the clinic at the provider's discretion.      For prescription refill requests, have your pharmacy contact our office and allow 72 hours for refills to be completed.    Today you received the following chemotherapy and/or immunotherapy agents Rituxan, Bendeka      To help prevent nausea and vomiting after your treatment, we encourage you to take your nausea medication as directed.  BELOW ARE SYMPTOMS THAT SHOULD BE REPORTED IMMEDIATELY: *FEVER GREATER THAN 100.4 F (38 C) OR HIGHER *CHILLS OR SWEATING *NAUSEA AND VOMITING THAT IS NOT CONTROLLED WITH YOUR NAUSEA MEDICATION *UNUSUAL SHORTNESS OF BREATH *UNUSUAL BRUISING OR BLEEDING *URINARY PROBLEMS (pain or burning when urinating, or frequent urination) *BOWEL PROBLEMS (unusual diarrhea, constipation, pain near the anus) TENDERNESS IN MOUTH AND THROAT WITH OR WITHOUT PRESENCE OF ULCERS (sore throat, sores in mouth, or a toothache) UNUSUAL RASH, SWELLING OR PAIN  UNUSUAL VAGINAL DISCHARGE OR ITCHING   Items with * indicate a potential emergency and should be followed up as soon as possible or go to the Emergency Department if any problems should occur.  Please show the CHEMOTHERAPY ALERT CARD or IMMUNOTHERAPY ALERT CARD at  check-in to the Emergency Department and triage nurse.  Should you have questions after your visit or need to cancel or reschedule your appointment, please contact [facility]  [phone] and follow the prompts.  Office hours are 8:00 a.m. to 4:30 p.m. Monday - Friday. Please note that voicemails left after 4:00 p.m. may not be returned until the following business day.  We are closed weekends and major holidays. You have access to a nurse at all times for urgent questions. Please call the main number to the clinic [phone] and follow the prompts.  For any non-urgent questions, you may also contact your provider using MyChart. We now offer e-Visits for anyone 21 and older to request care online for non-urgent symptoms. For details visit [email or web address].   Also download the MyChart app! Go to the app store, search "MyChart", open the app, select [facility], and log in with your MyChart username and password.  Due to Covid, a mask is required upon entering the hospital/clinic. If you do not have a mask, one will be given to you upon arrival. For doctor visits, patients may have 1 support person aged 13 or older with them. For treatment visits, patients cannot have anyone with them due to current Covid guidelines and our immunocompromised population. Bendamustine Injection What is this medication? BENDAMUSTINE (BEN da MUS teen) is a chemotherapy drug. It is used to treat chronic lymphocytic leukemia and non-Hodgkin lymphoma. This medicine may be used for other purposes; ask your health care provider or pharmacist if you have questions. COMMON BRAND NAME(S): BELRAPZO, BENDEKA, Treanda What should I tell my  care team before I take this medication? They need to know if you have any of these conditions: infection (especially a virus infection such as chickenpox, cold sores, or herpes) kidney disease liver disease an unusual or allergic reaction to  bendamustine, mannitol, other medicines, foods, dyes, or preservatives pregnant or trying to get pregnant breast-feeding How should I use this medication? This medicine is for infusion into a vein. It is given by a health care professional in a hospital or clinic setting. Talk to your pediatrician regarding the use of this medicine in children. Special care may be needed. Overdosage: If you think you have taken too much of this medicine contact a poison control center or emergency room at once. NOTE: This medicine is only for you. Do not share this medicine with others. What if I miss a dose? It is important not to miss your dose. Call your doctor or health care professional if you are unable to keep an appointment. What may interact with this medication? Do not take this medicine with any of the following medications: clozapine This medicine may also interact with the following medications: atazanavir cimetidine ciprofloxacin enoxacin fluvoxamine medicines for seizures like carbamazepine and phenobarbital mexiletine rifampin tacrine thiabendazole zileuton This list may not describe all possible interactions. Give your health care provider a list of all the medicines, herbs, non-prescription drugs, or dietary supplements you use. Also tell them if you smoke, drink alcohol, or use illegal drugs. Some items may interact with your medicine. What should I watch for while using this medication? This drug may make you feel generally unwell. This is not uncommon, as chemotherapy can affect healthy cells as well as cancer cells. Report any side effects. Continue your course of treatment even though you feel ill unless your doctor tells you to stop. You may need blood work done while you are taking this medicine. Call your doctor or healthcare provider for advice if you get a fever, chills or sore throat, or other symptoms of a cold or flu. Do not treat yourself. This drug decreases your body's  ability to fight infections. Try to avoid being around people who are sick. This medicine may cause serious skin reactions. They can happen weeks to months after starting the medicine. Contact your healthcare provider right away if you notice fevers or flu-like symptoms with a rash. The rash may be red or purple and then turn into blisters or peeling of the skin. Or, you might notice a red rash with swelling of the face, lips or lymph nodes in your neck or under your arms. In some patients, this medicine may cause a serious brain infection that may cause death. If you have any problems seeing, thinking, speaking, walking, or standing, tell your health care provider right away. If you cannot reach your health care provider, urgently seek other source of medical care. This medicine may increase your risk to bruise or bleed. Call your doctor or healthcare provider if you notice any unusual bleeding. Talk to your doctor about your risk of cancer. You may be more at risk for certain types of cancers if you take this medicine. This medicine may increase your risk of skin cancer. Check your skin for changes to moles or for new growths while taking this medicine. Call your health care provider if you notice any of these skin changes. Do not become pregnant while taking this medicine or for at least 6 months after stopping it. Women should inform their doctor if they wish to  become pregnant or think they might be pregnant. Men should not father a child while taking this medicine and for at least 3 months after stopping it. There is a potential for serious side effects to an unborn child. Talk to your healthcare provider or pharmacist for more information. Do not breast-feed an infant while taking this medicine or for at least 1 week after stopping it. This medicine may make it more difficult to father a child. You should talk with your doctor or healthcare provider if you are concerned about your fertility. What side  effects may I notice from receiving this medication? Side effects that you should report to your doctor or health care professional as soon as possible: allergic reactions like skin rash, itching or hives, swelling of the face, lips, or tongue low blood counts - this medicine may decrease the number of white blood cells, red blood cells and platelets. You may be at increased risk for infections and bleeding. rash, fever, and swollen lymph nodes redness, blistering, peeling, or loosening of the skin, including inside the mouth signs of infection like fever or chills, cough, sore throat, pain or difficulty passing urine signs of decreased platelets or bleeding like bruising, pinpoint red spots on the skin, black, tarry stools, blood in the urine signs of decreased red blood cells like being unusually weak or tired, fainting spells, lightheadedness signs and symptoms of kidney injury like trouble passing urine or change in the amount of urine signs and symptoms of liver injury like dark yellow or brown urine; general ill feeling or flu-like symptoms; light-colored stools; loss of appetite; nausea; right upper belly pain; unusually weak or tired; yellowing of the eyes or skin Side effects that usually do not require medical attention (report to your doctor or health care professional if they continue or are bothersome): constipation decreased appetite diarrhea headache mouth sores nausea, vomiting tiredness This list may not describe all possible side effects. Call your doctor for medical advice about side effects. You may report side effects to FDA at 1-800-FDA-1088. Where should I keep my medication? This drug is given in a hospital or clinic and will not be stored at home. NOTE: This sheet is a summary. It may not cover all possible information. If you have questions about this medicine, talk to your doctor, pharmacist, or health care provider.  2022 Elsevier/Gold Standard (2020-05-13  12:11:43) Rituximab Injection What is this medication? RITUXIMAB (ri TUX i mab) is a monoclonal antibody. It is used to treat certain types of cancer like non-Hodgkin lymphoma and chronic lymphocytic leukemia. It is also used to treat rheumatoid arthritis, granulomatosis with polyangiitis, microscopic polyangiitis, and pemphigus vulgaris. This medicine may be used for other purposes; ask your health care provider or pharmacist if you have questions. COMMON BRAND NAME(S): RIABNI, Rituxan, RUXIENCE What should I tell my care team before I take this medication? They need to know if you have any of these conditions: chest pain heart disease infection especially a viral infection such as chickenpox, cold sores, hepatitis B, or herpes immune system problems irregular heartbeat or rhythm kidney disease low blood counts (white cells, platelets, or red cells) lung disease recent or upcoming vaccine an unusual or allergic reaction to rituximab, other medicines, foods, dyes, or preservatives pregnant or trying to get pregnant breast-feeding How should I use this medication? This medicine is injected into a vein. It is given by a health care provider in a hospital or clinic setting. A special MedGuide will be given to you  before each treatment. Be sure to read this information carefully each time. Talk to your health care provider about the use of this medicine in children. While this drug may be prescribed for children as young as 6 months for selected conditions, precautions do apply. Overdosage: If you think you have taken too much of this medicine contact a poison control center or emergency room at once. NOTE: This medicine is only for you. Do not share this medicine with others. What if I miss a dose? Keep appointments for follow-up doses. It is important not to miss your dose. Call your health care provider if you are unable to keep an appointment. What may interact with this medication? Do  not take this medicine with any of the following medicines: live vaccines This medicine may also interact with the following medicines: cisplatin This list may not describe all possible interactions. Give your health care provider a list of all the medicines, herbs, non-prescription drugs, or dietary supplements you use. Also tell them if you smoke, drink alcohol, or use illegal drugs. Some items may interact with your medicine. What should I watch for while using this medication? Your condition will be monitored carefully while you are receiving this medicine. You may need blood work done while you are taking this medicine. This medicine can cause serious infusion reactions. To reduce the risk your health care provider may give you other medicines to take before receiving this one. Be sure to follow the directions from your health care provider. This medicine may increase your risk of getting an infection. Call your health care provider for advice if you get a fever, chills, sore throat, or other symptoms of a cold or flu. Do not treat yourself. Try to avoid being around people who are sick. Call your health care provider if you are around anyone with measles, chickenpox, or if you develop sores or blisters that do not heal properly. Avoid taking medicines that contain aspirin, acetaminophen, ibuprofen, naproxen, or ketoprofen unless instructed by your health care provider. These medicines may hide a fever. This medicine may cause serious skin reactions. They can happen weeks to months after starting the medicine. Contact your health care provider right away if you notice fevers or flu-like symptoms with a rash. The rash may be red or purple and then turn into blisters or peeling of the skin. Or, you might notice a red rash with swelling of the face, lips or lymph nodes in your neck or under your arms. In some patients, this medicine may cause a serious brain infection that may cause death. If you have  any problems seeing, thinking, speaking, walking, or standing, tell your healthcare professional right away. If you cannot reach your healthcare professional, urgently seek other source of medical care. Do not become pregnant while taking this medicine or for at least 12 months after stopping it. Women should inform their health care provider if they wish to become pregnant or think they might be pregnant. There is potential for serious harm to an unborn child. Talk to your health care provider for more information. Women should use a reliable form of birth control while taking this medicine and for 12 months after stopping it. Do not breast-feed while taking this medicine or for at least 6 months after stopping it. What side effects may I notice from receiving this medication? Side effects that you should report to your health care provider as soon as possible: allergic reactions (skin rash, itching or hives; swelling of  the face, lips, or tongue) diarrhea edema (sudden weight gain; swelling of the ankles, feet, hands or other unusual swelling; trouble breathing) fast, irregular heartbeat heart attack (trouble breathing; pain or tightness in the chest, neck, back or arms; unusually weak or tired) infection (fever, chills, cough, sore throat, pain or trouble passing urine) kidney injury (trouble passing urine or change in the amount of urine) liver injury (dark yellow or brown urine; general ill feeling or flu-like symptoms; loss of appetite, right upper belly pain; unusually weak or tired, yellowing of the eyes or skin) low blood pressure (dizziness; feeling faint or lightheaded, falls; unusually weak or tired) low red blood cell counts (trouble breathing; feeling faint; lightheaded, falls; unusually weak or tired) mouth sores redness, blistering, peeling, or loosening of the skin, including inside the mouth stomach pain unusual bruising or bleeding wheezing (trouble breathing with loud or  whistling sounds) vomiting Side effects that usually do not require medical attention (report to your health care provider if they continue or are bothersome): headache joint pain muscle cramps, pain nausea This list may not describe all possible side effects. Call your doctor for medical advice about side effects. You may report side effects to FDA at 1-800-FDA-1088. Where should I keep my medication? This medicine is given in a hospital or clinic. It will not be stored at home. NOTE: This sheet is a summary. It may not cover all possible information. If you have questions about this medicine, talk to your doctor, pharmacist, or health care provider.  2022 Elsevier/Gold Standard (2020-11-07 15:47:26)

## 2021-09-11 ENCOUNTER — Other Ambulatory Visit: Payer: Self-pay

## 2021-09-11 ENCOUNTER — Inpatient Hospital Stay: Payer: Medicare HMO

## 2021-09-11 VITALS — BP 126/82 | HR 86 | Temp 97.0°F | Resp 18

## 2021-09-11 DIAGNOSIS — C8204 Follicular lymphoma grade I, lymph nodes of axilla and upper limb: Secondary | ICD-10-CM | POA: Diagnosis not present

## 2021-09-11 DIAGNOSIS — Z5112 Encounter for antineoplastic immunotherapy: Secondary | ICD-10-CM | POA: Diagnosis not present

## 2021-09-11 DIAGNOSIS — E291 Testicular hypofunction: Secondary | ICD-10-CM | POA: Diagnosis not present

## 2021-09-11 DIAGNOSIS — Z5111 Encounter for antineoplastic chemotherapy: Secondary | ICD-10-CM | POA: Diagnosis not present

## 2021-09-11 DIAGNOSIS — R0789 Other chest pain: Secondary | ICD-10-CM | POA: Diagnosis not present

## 2021-09-11 DIAGNOSIS — R5383 Other fatigue: Secondary | ICD-10-CM | POA: Diagnosis not present

## 2021-09-11 DIAGNOSIS — I10 Essential (primary) hypertension: Secondary | ICD-10-CM | POA: Diagnosis not present

## 2021-09-11 DIAGNOSIS — C8202 Follicular lymphoma grade I, intrathoracic lymph nodes: Secondary | ICD-10-CM

## 2021-09-11 DIAGNOSIS — Z5189 Encounter for other specified aftercare: Secondary | ICD-10-CM | POA: Diagnosis not present

## 2021-09-11 DIAGNOSIS — E785 Hyperlipidemia, unspecified: Secondary | ICD-10-CM | POA: Diagnosis not present

## 2021-09-11 MED ORDER — SODIUM CHLORIDE 0.9 % IV SOLN
300.0000 mg | Freq: Once | INTRAVENOUS | Status: AC
  Administered 2021-09-11: 300 mg via INTRAVENOUS
  Filled 2021-09-11: qty 30

## 2021-09-11 MED ORDER — SODIUM CHLORIDE 0.9 % IV SOLN
10.0000 mg | Freq: Once | INTRAVENOUS | Status: AC
  Administered 2021-09-11: 10 mg via INTRAVENOUS
  Filled 2021-09-11: qty 10

## 2021-09-11 MED ORDER — MONTELUKAST SODIUM 10 MG PO TABS
10.0000 mg | ORAL_TABLET | Freq: Once | ORAL | Status: AC
  Administered 2021-09-11: 10 mg via ORAL
  Filled 2021-09-11: qty 1

## 2021-09-11 MED ORDER — SODIUM CHLORIDE 0.9% FLUSH
10.0000 mL | INTRAVENOUS | Status: DC | PRN
  Administered 2021-09-11: 10 mL
  Filled 2021-09-11: qty 10

## 2021-09-11 MED ORDER — PEGFILGRASTIM-CBQV 6 MG/0.6ML ~~LOC~~ SOSY
6.0000 mg | PREFILLED_SYRINGE | Freq: Once | SUBCUTANEOUS | Status: AC
  Administered 2021-09-11: 6 mg via SUBCUTANEOUS
  Filled 2021-09-11: qty 0.6

## 2021-09-11 MED ORDER — FAMOTIDINE 20 MG IN NS 100 ML IVPB
20.0000 mg | Freq: Once | INTRAVENOUS | Status: AC
  Administered 2021-09-11: 20 mg via INTRAVENOUS
  Filled 2021-09-11: qty 20

## 2021-09-11 MED ORDER — DIPHENHYDRAMINE HCL 50 MG/ML IJ SOLN
50.0000 mg | Freq: Once | INTRAMUSCULAR | Status: AC
  Administered 2021-09-11: 50 mg via INTRAVENOUS
  Filled 2021-09-11: qty 1

## 2021-09-11 MED ORDER — HEPARIN SOD (PORK) LOCK FLUSH 100 UNIT/ML IV SOLN
INTRAVENOUS | Status: AC
  Administered 2021-09-11: 500 [IU]
  Filled 2021-09-11: qty 5

## 2021-09-11 MED ORDER — ACETAMINOPHEN 325 MG PO TABS
650.0000 mg | ORAL_TABLET | Freq: Once | ORAL | Status: AC
  Administered 2021-09-11: 650 mg via ORAL
  Filled 2021-09-11: qty 2

## 2021-09-11 MED ORDER — HEPARIN SOD (PORK) LOCK FLUSH 100 UNIT/ML IV SOLN
500.0000 [IU] | Freq: Once | INTRAVENOUS | Status: AC | PRN
  Filled 2021-09-11: qty 5

## 2021-09-11 MED ORDER — SODIUM CHLORIDE 0.9 % IV SOLN
Freq: Once | INTRAVENOUS | Status: AC
Start: 2021-09-11 — End: 2021-09-11
  Filled 2021-09-11: qty 250

## 2021-09-11 NOTE — Patient Instructions (Signed)
[facility]   Discharge Instructions: Thank you for choosing [facility] to provide your oncology and hematology care.  If you have a lab appointment with the [facility], please go directly to the [facility] and check in at the registration area.  Wear comfortable clothing and clothing appropriate for easy access to any Portacath or PICC line.   We strive to give you quality time with your provider. You may need to reschedule your appointment if you arrive late (15 or more minutes).  Arriving late affects you and other patients whose appointments are after yours.  Also, if you miss three or more appointments without notifying the office, you may be dismissed from the clinic at the provider's discretion.      For prescription refill requests, have your pharmacy contact our office and allow 72 hours for refills to be completed.    Today you received the following chemotherapy and/or immunotherapy agents: Ruxience. Today you also received the following: Udenyca.      To help prevent nausea and vomiting after your treatment, we encourage you to take your nausea medication as directed.  BELOW ARE SYMPTOMS THAT SHOULD BE REPORTED IMMEDIATELY: *FEVER GREATER THAN 100.4 F (38 C) OR HIGHER *CHILLS OR SWEATING *NAUSEA AND VOMITING THAT IS NOT CONTROLLED WITH YOUR NAUSEA MEDICATION *UNUSUAL SHORTNESS OF BREATH *UNUSUAL BRUISING OR BLEEDING *URINARY PROBLEMS (pain or burning when urinating, or frequent urination) *BOWEL PROBLEMS (unusual diarrhea, constipation, pain near the anus) TENDERNESS IN MOUTH AND THROAT WITH OR WITHOUT PRESENCE OF ULCERS (sore throat, sores in mouth, or a toothache) UNUSUAL RASH, SWELLING OR PAIN  UNUSUAL VAGINAL DISCHARGE OR ITCHING   Items with * indicate a potential emergency and should be followed up as soon as possible or go to the Emergency Department if any problems should occur.  Please show the CHEMOTHERAPY  ALERT CARD or IMMUNOTHERAPY ALERT CARD at check-in to the Emergency Department and triage nurse.  Should you have questions after your visit or need to cancel or reschedule your appointment, please contact [facility]  [phone] and follow the prompts.  Office hours are 8:00 a.m. to 4:30 p.m. Monday - Friday. Please note that voicemails left after 4:00 p.m. may not be returned until the following business day.  We are closed weekends and major holidays. You have access to a nurse at all times for urgent questions. Please call the main number to the clinic [phone] and follow the prompts.  For any non-urgent questions, you may also contact your provider using MyChart. We now offer e-Visits for anyone 58 and older to request care online for non-urgent symptoms. For details visit [email or web address].   Also download the MyChart app! Go to the app store, search "MyChart", open the app, select [facility], and log in with your MyChart username and password.  Due to Covid, a mask is required upon entering the hospital/clinic. If you do not have a mask, one will be given to you upon arrival. For doctor visits, patients may have 1 support person aged 20 or older with them. For treatment visits, patients cannot have anyone with them due to current Covid guidelines and our immunocompromised population.

## 2021-09-16 ENCOUNTER — Ambulatory Visit: Payer: Self-pay | Admitting: Dermatology

## 2021-09-17 ENCOUNTER — Other Ambulatory Visit: Payer: Self-pay

## 2021-09-17 ENCOUNTER — Encounter: Payer: Self-pay | Admitting: Dermatology

## 2021-09-17 ENCOUNTER — Ambulatory Visit (INDEPENDENT_AMBULATORY_CARE_PROVIDER_SITE_OTHER): Payer: Medicare HMO | Admitting: Dermatology

## 2021-09-17 DIAGNOSIS — L578 Other skin changes due to chronic exposure to nonionizing radiation: Secondary | ICD-10-CM | POA: Diagnosis not present

## 2021-09-17 DIAGNOSIS — D1801 Hemangioma of skin and subcutaneous tissue: Secondary | ICD-10-CM | POA: Diagnosis not present

## 2021-09-17 DIAGNOSIS — C4492 Squamous cell carcinoma of skin, unspecified: Secondary | ICD-10-CM

## 2021-09-17 DIAGNOSIS — L821 Other seborrheic keratosis: Secondary | ICD-10-CM | POA: Diagnosis not present

## 2021-09-17 DIAGNOSIS — D229 Melanocytic nevi, unspecified: Secondary | ICD-10-CM | POA: Diagnosis not present

## 2021-09-17 DIAGNOSIS — Z1283 Encounter for screening for malignant neoplasm of skin: Secondary | ICD-10-CM

## 2021-09-17 DIAGNOSIS — C44529 Squamous cell carcinoma of skin of other part of trunk: Secondary | ICD-10-CM | POA: Diagnosis not present

## 2021-09-17 DIAGNOSIS — L814 Other melanin hyperpigmentation: Secondary | ICD-10-CM | POA: Diagnosis not present

## 2021-09-17 DIAGNOSIS — L570 Actinic keratosis: Secondary | ICD-10-CM

## 2021-09-17 DIAGNOSIS — Z85828 Personal history of other malignant neoplasm of skin: Secondary | ICD-10-CM

## 2021-09-17 NOTE — Patient Instructions (Addendum)
Electrodesiccation and Curettage ("Scrape and Burn") Wound Care Instructions  Leave the original bandage on for 24 hours if possible.  If the bandage becomes soaked or soiled before that time, it is OK to remove it and examine the wound.  A small amount of post-operative bleeding is normal.  If excessive bleeding occurs, remove the bandage, place gauze over the site and apply continuous pressure (no peeking) over the area for 30 minutes. If this does not work, please call our clinic as soon as possible or page your doctor if it is after hours.   Once a day, cleanse the wound with soap and water. It is fine to shower. If a thick crust develops you may use a Q-tip dipped into dilute hydrogen peroxide (mix 1:1 with water) to dissolve it.  Hydrogen peroxide can slow the healing process, so use it only as needed.    After washing, apply petroleum jelly (Vaseline) or an antibiotic ointment if your doctor prescribed one for you, followed by a bandage.    For best healing, the wound should be covered with a layer of ointment at all times. If you are not able to keep the area covered with a bandage to hold the ointment in place, this may mean re-applying the ointment several times a day.  Continue this wound care until the wound has healed and is no longer open. It may take several weeks for the wound to heal and close.  Itching and mild discomfort is normal during the healing process.  If you have any discomfort, you can take Tylenol (acetaminophen) or ibuprofen as directed on the bottle. (Please do not take these if you have an allergy to them or cannot take them for another reason).  Some redness, tenderness and white or yellow material in the wound is normal healing.  If the area becomes very sore and red, or develops a thick yellow-green material (pus), it may be infected; please notify us.    Wound healing continues for up to one year following surgery. It is not unusual to experience pain in the scar  from time to time during the interval.  If the pain becomes severe or the scar thickens, you should notify the office.    A slight amount of redness in a scar is expected for the first six months.  After six months, the redness will fade and the scar will soften and fade.  The color difference becomes less noticeable with time.  If there are any problems, return for a post-op surgery check at your earliest convenience.  To improve the appearance of the scar, you can use silicone scar gel, cream, or sheets (such as Mederma or Serica) every night for up to one year. These are available over the counter (without a prescription).  Please call our office at [phone] for any questions or concerns.   Actinic keratoses are precancerous spots that appear secondary to cumulative UV radiation exposure/sun exposure over time. They are chronic with expected duration over 1 year. A portion of actinic keratoses will progress to squamous cell carcinoma of the skin. It is not possible to reliably predict which spots will progress to skin cancer and so treatment is recommended to prevent development of skin cancer.  Recommend daily broad spectrum sunscreen SPF 30+ to sun-exposed areas, reapply every 2 hours as needed.  Recommend staying in the shade or wearing long sleeves, sun glasses (UVA+UVB protection) and wide brim hats (4-inch brim around the entire circumference of the hat). Call  for new or changing lesions.   Cryotherapy Aftercare  Wash gently with soap and water everyday.   Apply Vaseline and Band-Aid daily until healed.      Melanoma ABCDEs  Melanoma is the most dangerous type of skin cancer, and is the leading cause of death from skin disease.  You are more likely to develop melanoma if you: Have light-colored skin, light-colored eyes, or red or blond hair Spend a lot of time in the sun Tan regularly, either outdoors or in a tanning bed Have had blistering sunburns, especially during  childhood Have a close family member who has had a melanoma Have atypical moles or large birthmarks  Early detection of melanoma is key since treatment is typically straightforward and cure rates are extremely high if we catch it early.   The first sign of melanoma is often a change in a mole or a new dark spot.  The ABCDE system is a way of remembering the signs of melanoma.  A for asymmetry:  The two halves do not match. B for border:  The edges of the growth are irregular. C for color:  A mixture of colors are present instead of an even brown color. D for diameter:  Melanomas are usually (but not always) greater than 7mm - the size of a pencil eraser. E for evolution:  The spot keeps changing in size, shape, and color.  Please check your skin once per month between visits. You can use a small mirror in front and a large mirror behind you to keep an eye on the back side or your body.   If you see any new or changing lesions before your next follow-up, please call to schedule a visit.  Please continue daily skin protection including broad spectrum sunscreen SPF 30+ to sun-exposed areas, reapplying every 2 hours as needed when you're outdoors.   Staying in the shade or wearing long sleeves, sun glasses (UVA+UVB protection) and wide brim hats (4-inch brim around the entire circumference of the hat) are also recommended for sun protection.    If you have any questions or concerns for your doctor, please call our main line at [phone] and press option 4 to reach your doctor's medical assistant. If no one answers, please leave a voicemail as directed and we will return your call as soon as possible. Messages left after 4 pm will be answered the following business day.   You may also send [place] a message via [facility]. We typically respond to MyChart messages within 1-2 business days.  For prescription refills, please ask your pharmacy to contact our office. Our fax number is [phone].  If  you have an urgent issue when the clinic is closed that cannot wait until the next business day, you can page your doctor at the number below.    Please note that while we do our best to be available for urgent issues outside of office hours, we are not available 24/7.   If you have an urgent issue and are unable to reach [place], you may choose to seek medical care at your doctor's office, retail clinic, urgent care center, or emergency room.  If you have a medical emergency, please immediately call 911 or go to the emergency department.  Pager Numbers  - Dr. Nehemiah Massed: [phone]  - Dr. Laurence Ferrari: [phone]  - Dr. Nicole Kindred: [phone]  In the event of inclement weather, please call our main line at [phone] for an update on the status of any delays or  closures.  Dermatology Medication Tips: Please keep the boxes that topical medications come in in order to help keep track of the instructions about where and how to use these. Pharmacies typically print the medication instructions only on the boxes and not directly on the medication tubes.   If your medication is too expensive, please contact our office at [phone] option 4 or send [place] a message through [place] [facility].   We are unable to tell what your co-pay for medications will be in advance as this is different depending on your insurance coverage. However, we may be able to find a substitute medication at lower cost or fill out paperwork to get insurance to cover a needed medication.   If a prior authorization is required to get your medication covered by your insurance company, please allow [place] 1-2 business days to complete this process.  Drug prices often vary depending on where the prescription is filled and some pharmacies may offer cheaper prices.  The website www.goodrx.com contains coupons for medications through different pharmacies. The prices here do not account for what the cost may be with help from insurance (it may be cheaper  with your insurance), but the website can give you the price if you did not use any insurance.  - You can print the associated coupon and take it with your prescription to the pharmacy.  - You may also stop by our office during regular business hours and pick up a GoodRx coupon card.  - If you need your prescription sent electronically to a different pharmacy, notify our office through [facility] or by phone at [phone] option 4.

## 2021-09-17 NOTE — Progress Notes (Signed)
Follow-Up Visit   Subjective  Russell Simpson is a 60 y.o. male who presents for the following: Follow-up (Patient here today for full body exam. Patient reports a small spot at left cheek that he has scratched off but comes back. Patient also here today to have area treated with ED&C at chest).  Patient here for full body skin exam and skin cancer screening.  The following portions of the chart were reviewed this encounter and updated as appropriate:  Tobacco  Allergies  Meds  Problems  Med Hx  Surg Hx  Fam Hx      Review of Systems: No other skin or systemic complaints except as noted in HPI or Assessment and Plan.   Objective  Well appearing patient in no apparent distress; mood and affect are within normal limits.  A full examination was performed including scalp, head, eyes, ears, nose, lips, neck, chest, axillae, abdomen, back, buttocks, bilateral upper extremities, bilateral lower extremities, hands, feet, fingers, toes, fingernails, and toenails. All findings within normal limits unless otherwise noted below.  left cheek x 1, left forehead x 1, left jaw x 1, left scapha x 1, left hand x 3 , left forearm x 1, upper mid abdomen x 3, left upper abdomen x1, right upper arm x 1, left medial calf x 1 (14) Erythematous thin papules/macules with gritty scale.   upper chest right of midline Healing biopsy   Assessment & Plan  Actinic keratosis (14) left cheek x 1, left forehead x 1, left jaw x 1, left scapha x 1, left hand x 3 , left forearm x 1, upper mid abdomen x 3, left upper abdomen x1, right upper arm x 1, left medial calf x 1  Actinic keratoses are precancerous spots that appear secondary to cumulative UV radiation exposure/sun exposure over time. They are chronic with expected duration over 1 year. A portion of actinic keratoses will progress to squamous cell carcinoma of the skin. It is not possible to reliably predict which spots will progress to skin cancer and so  treatment is recommended to prevent development of skin cancer.  Recommend daily broad spectrum sunscreen SPF 30+ to sun-exposed areas, reapply every 2 hours as needed.  Recommend staying in the shade or wearing long sleeves, sun glasses (UVA+UVB protection) and wide brim hats (4-inch brim around the entire circumference of the hat). Call for new or changing lesions.  Left calf - hypertrophic ak  Prior to procedure, discussed risks of blister formation, small wound, skin dyspigmentation, or rare scar following cryotherapy. Recommend Vaseline ointment to treated areas while healing.   Destruction of lesion - left cheek x 1, left forehead x 1, left jaw x 1, left scapha x 1, left hand x 3 , left forearm x 1, upper mid abdomen x 3, left upper abdomen x1, right upper arm x 1, left medial calf x 1  Destruction method: cryotherapy   Informed consent: discussed and consent obtained   Lesion destroyed using liquid nitrogen: Yes   Cryotherapy cycles:  2 Outcome: patient tolerated procedure well with no complications   Post-procedure details: wound care instructions given   Additional details:  Prior to procedure, discussed risks of blister formation, small wound, skin dyspigmentation, or rare scar following cryotherapy. Recommend Vaseline ointment to treated areas while healing.   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 Anesthesia: the lesion was anesthetized in a standard fashion   Anesthetic:  1% lidocaine w/ epinephrine 1-100,000 buffered w/ 8.4% NaHCO3 Curettage performed in three different directions: Yes   Electrodesiccation performed over the curetted area: Yes    Curettage cycles:  3 Final wound size (cm):  1.3 Hemostasis achieved with:  electrodesiccation Outcome: patient tolerated procedure well with no complications   Post-procedure details: sterile dressing applied and wound care instructions given   Dressing type: petrolatum   Additional details:  Mupirocin and a pressure dressing applied  Biopsy proven well differentiated squamous cell carcinoma at upper chest right of midline  Treat today with ED&C  Lentigines - Scattered tan macules - Due to sun exposure - Benign-appearing, observe - Recommend daily broad spectrum sunscreen SPF 30+ to sun-exposed areas, reapply every 2 hours as needed. - Call for any changes  Seborrheic Keratoses - Stuck-on, waxy, tan-brown papules and/or plaques  - Benign-appearing - Discussed benign etiology and prognosis. - Observe - Call for any changes  Melanocytic Nevi - Tan-brown and/or pink-flesh-colored symmetric macules and papules - Benign appearing on exam today - Observation - Call clinic for new or changing moles - Recommend daily use of broad spectrum spf 30+ sunscreen to sun-exposed areas.   Hemangiomas - Red papules - Discussed benign nature - Observe - Call for any changes  Actinic Damage - Chronic condition, secondary to cumulative UV/sun exposure - diffuse scaly erythematous macules with underlying dyspigmentation - Recommend daily broad spectrum sunscreen SPF 30+ to sun-exposed areas, reapply every 2 hours as needed.  - Staying in the shade or wearing long sleeves, sun glasses (UVA+UVB protection) and wide brim hats (4-inch brim around the entire circumference of the hat) are also recommended for sun protection.  - Call for new or changing lesions.  History of Squamous Cell Carcinoma of the Skin - No evidence of recurrence today at multiple locations see history  - No lymphadenopathy - Recommend regular full body skin exams - Recommend daily broad spectrum sunscreen SPF 30+ to  sun-exposed areas, reapply every 2 hours as needed.  - Call if any new or changing lesions are noted between office visits  Skin cancer screening performed today.  Return in about 6 months (around 03/18/2022) for fbse and history of scc . I, Ruthell Rummage, CMA, am acting as scribe for Forest Gleason, MD.  Documentation: I have reviewed the above documentation for accuracy and completeness, and I agree with the above.  Forest Gleason, MD

## 2021-09-19 ENCOUNTER — Encounter: Payer: Self-pay | Admitting: Cardiology

## 2021-09-19 ENCOUNTER — Ambulatory Visit (INDEPENDENT_AMBULATORY_CARE_PROVIDER_SITE_OTHER): Payer: Medicare HMO | Admitting: Cardiology

## 2021-09-19 ENCOUNTER — Other Ambulatory Visit: Payer: Self-pay

## 2021-09-19 VITALS — BP 110/84 | HR 117 | Ht 74.0 in | Wt 224.0 lb

## 2021-09-19 DIAGNOSIS — E782 Mixed hyperlipidemia: Secondary | ICD-10-CM

## 2021-09-19 DIAGNOSIS — I10 Essential (primary) hypertension: Secondary | ICD-10-CM | POA: Diagnosis not present

## 2021-09-19 DIAGNOSIS — F17200 Nicotine dependence, unspecified, uncomplicated: Secondary | ICD-10-CM | POA: Diagnosis not present

## 2021-09-19 DIAGNOSIS — R000 Tachycardia, unspecified: Secondary | ICD-10-CM

## 2021-09-19 MED ORDER — DILTIAZEM HCL ER COATED BEADS 120 MG PO CP24: 120.0000 mg | ORAL_CAPSULE | Freq: Every day | ORAL | 5 refills | Status: DC

## 2021-09-19 NOTE — Progress Notes (Signed)
Cardiology Office Note:    Date:  09/19/2021   ID:  Russell Simpson, DOB [birth year], MRN [id number]  PCP:  Charlynne Cousins, MD   [facility] Cardiologist:  Kate Sable, MD     Referring MD: No ref. provider found   Chief Complaint  Patient presents with   Follow-up    History of Present Illness:    Russell Simpson is a 60 y.o. male with a hx of hypertension, hyperlipidemia, asthma, current smoker x15 years, lymphoma who presents for follow-up.  Patient initially seen due to cough induced syncope.  Work-up with echocardiogram and cardiac monitor did not reveal any structural abnormalities or arrhythmias.  He was diagnosed with lymphoma, has been on chemotherapy over the past 6 months.  During chemo treatments, his heart rates were noted to be elevated.  He was started on Toprol-XL, titrated to 100 mg after last visit a month ago.  He states heart rates have improved, usually in the 90s.  He otherwise feels well, no concerns at this time.  He still smokes cigarettes, is working on quitting.  Prior notes Echo 07/2020 EF 60 to 65%, impaired relaxation. Cardiac monitor 08/2021.  No significant arrhythmias.  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[place])    Arthritis    Asthma    Basal cell carcinoma 01/28/2021   [facility] 03/04/2021   Brain 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   Chronic pain    right arm   Depression    Environmental and seasonal allergies    Follicular lymphoma ([place])    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   left neck, Moh's 05/29/21   Hypertension    Pneumonia    SCC (squamous cell carcinoma) 08/28/2021   upper chest right of midline, EDC done 09/17/2021   Sleep apnea    does not wear CPAP ; uses humidifier instead   Squamous cell carcinoma of skin 02/19/2021   L inferior mandible, treated with EDC   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/28/2021   R ant shoulder - ED&C    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/28/2021   L upper abdomen - ED&C    Past Surgical History:  Procedure Laterality Date   ANTERIOR CERVICAL DECOMP/DISCECTOMY FUSION N/A 06/17/2016   Procedure: ANTERIOR CERVICAL DECOMPRESSION FUSION, CERVICAL 3-4, CERVICAL 4-5 WITH INSTRUMENTATION AND ALLOGRAFT;  Surgeon: Phylliss Bob, MD;  Location: [facility];  Service: Orthopedics;  Laterality: N/A;  ANTERIOR CERVICAL DECOMPRESSION FUSION, CERVICAL 3-4, CERVICAL 4-5 WITH INSTRUMENTATION AND ALLOGRAFT   BACK SURGERY     NECK SURGERY  2009   PORTA CATH INSERTION N/A 04/03/2021   Procedure: PORTA CATH INSERTION;  Surgeon: Algernon Huxley, MD;  Location: [facility];  Service: Cardiovascular;  Laterality: N/A;    Current Medications: Current Meds  Medication Sig   acetaminophen (TYLENOL) 325 MG tablet Take 2 tablets (650 mg total) by mouth daily. for 5 days starting 2 days prior to treatment   acyclovir (ZOVIRAX) 400 MG tablet TAKE 1 TABLET BY MOUTH TWICE DAILY   allopurinol (ZYLOPRIM) 300 MG tablet TAKE 1 TABLET BY MOUTH ONCE DAILY   atorvastatin (LIPITOR) 40 MG tablet Take 1 tablet (40 mg total) by mouth daily.   buPROPion (WELLBUTRIN XL) 150 MG 24 hr tablet Take 1 tablet by mouth every morning.   clonazePAM (KLONOPIN) 0.5 MG tablet Take 0.5 mg by mouth daily.   diltiazem (CARDIZEM CD) 120  MG 24 hr capsule Take 1 capsule (120 mg total) by mouth daily.   diphenhydrAMINE (BENADRYL) 25 mg capsule Take 1 capsule (25 mg total) by mouth daily for 1 dose. Take 1 capsule daily for 5 days starting 2 days prior to treatment   diphenoxylate-atropine (LOMOTIL) 2.5-0.025 MG tablet Take 1 tablet by mouth 4 (four) times daily as needed for diarrhea or loose stools.   DULoxetine (CYMBALTA) 60 MG capsule TAKE 1 CAPSULE EVERY DAY   famotidine (PEPCID) 20 MG tablet TAKE 1 TABLET TWICE DAILY   finasteride (PROSCAR) 5 MG tablet TAKE 1 TABLET BY MOUTH ONCE DAILY   lansoprazole (PREVACID) 30 MG capsule TAKE 1 CAPSULE BY MOUTH  ONCE DAILY AT NOON   loratadine (CLARITIN) 10 MG tablet Take 10 mg by mouth daily.    metoprolol succinate (TOPROL-XL) 50 MG 24 hr tablet Take 2 tablets (100 mg total) by mouth daily. Take with or immediately following a meal.   mirtazapine (REMERON) 15 MG tablet Take 15 mg by mouth at bedtime.   modafinil (PROVIGIL) 200 MG tablet Take 200 mg by mouth daily.   mupirocin ointment (BACTROBAN) 2 % Apply 1 application topically 2 (two) times daily.   OLANZapine (ZYPREXA) 7.5 MG tablet Take 7.5 mg by mouth at bedtime.   ondansetron (ZOFRAN) 8 MG tablet Take 1 tablet (8 mg total) by mouth 2 (two) times daily as needed for refractory nausea / vomiting. Start on day 2 after bendamustine chemo.   prochlorperazine (COMPAZINE) 10 MG tablet TAKE 1 TABLET BY MOUTH EVERY 6 HOURS AS NEEDED NAUSEA AND VOMITING   sildenafil (REVATIO) 20 MG tablet TAKE 3-5 TABLETS BY MOUTH ONCE DAILY AS NEEDED FOR ERECTILE DYSFUNCTION   tacrolimus (PROTOPIC) 0.1 % ointment Apply 1 application topically 2 (two) times daily.   testosterone cypionate (DEPOTESTOSTERONE CYPIONATE) 200 MG/ML injection Inject 1 mL (200 mg total) into the muscle every 14 (fourteen) days.   traMADol (ULTRAM) 50 MG tablet Take 50 mg by mouth every 6 (six) hours as needed.   triamcinolone (KENALOG) 0.1 % Apply to affected areas 1-2 times a day until rash improved. Avoid face, groin, underarms.   [DISCONTINUED] amLODipine (NORVASC) 5 MG tablet Take 1 tablet (5 mg total) by mouth daily. Please schedule an office visit before anymore refills.     Allergies:   Penicillins, Tetanus toxoids, Lisinopril, Losartan, Tetanus toxoid, Codeine, Doxycycline, and Ruxience [rituximab-pvvr]   Social History   Socioeconomic History   Marital status: Divorced    Spouse name: Not on file   Number of children: Not on file   Years of education: Not on file   Highest education level: Not on file  Occupational History   Not on file  Tobacco Use   Smoking status: Every Day     Types: Cigars   Smokeless tobacco: Never   Tobacco comments:    5-6 cigars a day  Vaping Use   Vaping Use: Former   Start date: 04/30/2017   Quit date: 11/30/2017  Substance and Sexual Activity   Alcohol use: Not Currently    Alcohol/week: 0.0 standard drinks   Drug use: No   Sexual activity: Not Currently  Other Topics Concern   Not on file  Social History Narrative   Not on file   Social Determinants of Health   Financial Resource Strain: Not on file  Food Insecurity: Not on file  Transportation Needs: Not on file  Physical Activity: Not on file  Stress: Not on file  Social Connections:  Not on file     Family History: The patient's family history includes Diabetes in his mother and another family member.  ROS:   Please see the history of present illness.     All other systems reviewed and are negative.  EKGs/Labs/Other Studies Reviewed:    The following studies were reviewed today:   EKG:  EKG is  ordered today.  The ekg ordered today demonstrates sinus tachycardia, heart rate 117  Recent Labs: 05/14/2021: B Natriuretic Peptide 11.4 05/20/2021: Magnesium 1.9 08/15/2021: TSH 4.070 09/09/2021: ALT 35; BUN 8; Creatinine, Ser 0.96; Hemoglobin 14.1; Platelets 178; Potassium 3.8; Sodium 133  Recent Lipid Panel    Component Value Date/Time   CHOL 255 (H) 02/14/2019 1513   TRIG 155 (H) 02/14/2019 1513   HDL 53 02/14/2019 1513   CHOLHDL 4.8 02/14/2019 1513   CHOLHDL 2.6 10/15/2017 0906   LDLCALC 171 (H) 02/14/2019 1513   LDLCALC 108 (H) 10/15/2017 0906     Risk Assessment/Calculations:          Physical Exam:    VS:  BP 110/84 (BP Location: Left Arm, Patient Position: Sitting, Cuff Size: Large)   Pulse (!) 117   Ht 6\' 2"  (1.88 m)   Wt 224 lb (101.6 kg)   SpO2 97%   BMI 28.76 kg/m     Wt Readings from Last 3 Encounters:  09/19/21 224 lb (101.6 kg)  09/09/21 226 lb 11.2 oz (102.8 kg)  08/25/21 218 lb (98.9 kg)     GEN:  Well nourished, well  developed in no acute distress HEENT: Normal NECK: No JVD; No carotid bruits LYMPHATICS: No lymphadenopathy CARDIAC: Tachycardia, regular, no murmurs, rubs, gallops RESPIRATORY:  Clear to auscultation without rales, wheezing or rhonchi  ABDOMEN: Soft, non-tender, non-distended MUSCULOSKELETAL:  No edema; No deformity  SKIN: Warm and dry NEUROLOGIC:  Alert and oriented x 3 PSYCHIATRIC:  Normal affect   ASSESSMENT:    1. Inappropriate sinus tachycardia   2. Primary hypertension   3. Hyperlipidemia, mixed   4. Smoking    PLAN:    In order of problems listed above:  Patient with inappropriate sinus tach, overall improved with beta-blocker, EKG showing heart rate 117 today.  Can start Cardizem 120 mg daily.  Continue Toprol-XL. Hypertension, stop Norvasc, start Cardizem possible due to tachycardia.  Continue Toprol-XL. Hyperlipidemia, continue Lipitor, repeat fasting lipid profile prior to follow-up visit. Current smoker, cessation advised.  Follow-up in 3 months.     Medication Adjustments/Labs and Tests Ordered: Current medicines are reviewed at length with the patient today.  Concerns regarding medicines are outlined above.  Orders Placed This Encounter  Procedures   Lipid panel   EKG 12-Lead   Meds ordered this encounter  Medications   diltiazem (CARDIZEM CD) 120 MG 24 hr capsule    Sig: Take 1 capsule (120 mg total) by mouth daily.    Dispense:  30 capsule    Refill:  5    Stop Amlodipine    Patient Instructions  Medication Instructions:  Your physician has recommended you make the following change in your medication:    STOP Amlodipine  START Cardizem CD 120 mg daily. An Rx has been sent to your pharmacy.   *If you need a refill on your cardiac medications before your next appointment, please call your pharmacy*   Lab Work: Your physician recommends that you return for a FASTING lipid profile: in 3 months  If you have labs (blood work) drawn today and  your  tests are completely normal, you will receive your results only by: MyChart Message (if you have MyChart) OR A paper copy in the mail If you have any lab test that is abnormal or we need to change your treatment, we will call you to review the results.   Testing/Procedures: None ordered   Follow-Up: At [facility], you and your health needs are our priority.  As part of our continuing mission to provide you with exceptional heart care, we have created designated Provider Care Teams.  These Care Teams include your primary Cardiologist (physician) and Advanced Practice Providers (APPs -  Physician Assistants and Nurse Practitioners) who all work together to provide you with the care you need, when you need it.  We recommend signing up for the patient portal called "MyChart".  Sign up information is provided on this After Visit Summary.  MyChart is used to connect with patients for Virtual Visits (Telemedicine).  Patients are able to view lab/test results, encounter notes, upcoming appointments, etc.  Non-urgent messages can be sent to your provider as well.   To learn more about what you can do with MyChart, go to [email or web address].    Your next appointment:   3 month(s)  (Lab to be scheduled prior to the appt)  The format for your next appointment:   In Person  Provider:   You may see Kate Sable, MD or one of the following Advanced Practice Providers on your designated Care Team:   Murray Hodgkins, NP Christell Faith, PA-C Marrianne Mood, PA-C Cadence [place], [place]   Other Instructions N/A   Signed, Kate Sable, MD  09/19/2021 5:14 PM    [place]

## 2021-09-19 NOTE — Patient Instructions (Signed)
Medication Instructions:  Your physician has recommended you make the following change in your medication:    STOP Amlodipine  START Cardizem CD 120 mg daily. An Rx has been sent to your pharmacy.   *If you need a refill on your cardiac medications before your next appointment, please call your pharmacy*   Lab Work: Your physician recommends that you return for a FASTING lipid profile: in 3 months  If you have labs (blood work) drawn today and your tests are completely normal, you will receive your results only by: [place] (if you have MyChart) OR A paper copy in the mail If you have any lab test that is abnormal or we need to change your treatment, we will call you to review the results.   Testing/Procedures: None ordered   Follow-Up: At [facility], you and your health needs are our priority.  As part of our continuing mission to provide you with exceptional heart care, we have created designated Provider Care Teams.  These Care Teams include your primary Cardiologist (physician) and Advanced Practice Providers (APPs -  Physician Assistants and Nurse Practitioners) who all work together to provide you with the care you need, when you need it.  We recommend signing up for the patient portal called "MyChart".  Sign up information is provided on this After Visit Summary.  MyChart is used to connect with patients for Virtual Visits (Telemedicine).  Patients are able to view lab/test results, encounter notes, upcoming appointments, etc.  Non-urgent messages can be sent to your provider as well.   To learn more about what you can do with MyChart, go to [email or web address].    Your next appointment:   3 month(s)  (Lab to be scheduled prior to the appt)  The format for your next appointment:   In Person  Provider:   You may see Kate Sable, MD or one of the following Advanced Practice Providers on your designated Care Team:   Murray Hodgkins, NP Christell Faith,  PA-C Marrianne Mood, PA-C Cadence Kathlen [place], [place]   Other Instructions N/A

## 2021-09-22 ENCOUNTER — Other Ambulatory Visit: Payer: Self-pay

## 2021-09-22 ENCOUNTER — Ambulatory Visit
Admission: RE | Admit: 2021-09-22 | Discharge: 2021-09-22 | Disposition: A | Discharge status: Home / Self Care | Payer: Medicare HMO | Source: Ambulatory Visit | Attending: Oncology | Admitting: Oncology

## 2021-09-22 DIAGNOSIS — C8290 Follicular lymphoma, unspecified, unspecified site: Secondary | ICD-10-CM | POA: Diagnosis not present

## 2021-09-22 DIAGNOSIS — C8202 Follicular lymphoma grade I, intrathoracic lymph nodes: Secondary | ICD-10-CM

## 2021-09-22 DIAGNOSIS — R918 Other nonspecific abnormal finding of lung field: Secondary | ICD-10-CM | POA: Insufficient documentation

## 2021-09-22 LAB — GLUCOSE, CAPILLARY: Glucose-Capillary: 183 mg/dL — ABNORMAL HIGH (ref 70–99)

## 2021-09-22 MED ORDER — FLUDEOXYGLUCOSE F - 18 (FDG) INJECTION
11.7000 | Freq: Once | INTRAVENOUS | Status: AC | PRN
  Administered 2021-09-22: 12.3 via INTRAVENOUS

## 2021-09-23 NOTE — Telephone Encounter (Signed)
Copied from [place] [id number]. Topic: General - Other >> Sep 22, 2021 11:44 AM Greggory Keen D wrote: Reason for CRM: Pt called asking if Dr. Neomia Dear could prescribe him something to help him cup back on his smoking.  CB#  [phone]

## 2021-09-24 ENCOUNTER — Inpatient Hospital Stay: Payer: Medicare HMO

## 2021-09-24 ENCOUNTER — Encounter: Payer: Self-pay | Admitting: Oncology

## 2021-09-24 ENCOUNTER — Inpatient Hospital Stay (HOSPITAL_BASED_OUTPATIENT_CLINIC_OR_DEPARTMENT_OTHER): Payer: Medicare HMO | Admitting: Oncology

## 2021-09-24 ENCOUNTER — Other Ambulatory Visit: Payer: Self-pay

## 2021-09-24 VITALS — BP 120/90 | HR 100 | Temp 99.3°F | Resp 16 | Ht 74.0 in | Wt 229.0 lb

## 2021-09-24 DIAGNOSIS — C8204 Follicular lymphoma grade I, lymph nodes of axilla and upper limb: Secondary | ICD-10-CM | POA: Diagnosis not present

## 2021-09-24 DIAGNOSIS — Z87891 Personal history of nicotine dependence: Secondary | ICD-10-CM

## 2021-09-24 DIAGNOSIS — E785 Hyperlipidemia, unspecified: Secondary | ICD-10-CM | POA: Diagnosis not present

## 2021-09-24 DIAGNOSIS — C8202 Follicular lymphoma grade I, intrathoracic lymph nodes: Secondary | ICD-10-CM

## 2021-09-24 DIAGNOSIS — I10 Essential (primary) hypertension: Secondary | ICD-10-CM | POA: Diagnosis not present

## 2021-09-24 DIAGNOSIS — R5383 Other fatigue: Secondary | ICD-10-CM | POA: Diagnosis not present

## 2021-09-24 DIAGNOSIS — R0789 Other chest pain: Secondary | ICD-10-CM | POA: Diagnosis not present

## 2021-09-24 DIAGNOSIS — Z5111 Encounter for antineoplastic chemotherapy: Secondary | ICD-10-CM | POA: Diagnosis not present

## 2021-09-24 DIAGNOSIS — E291 Testicular hypofunction: Secondary | ICD-10-CM | POA: Diagnosis not present

## 2021-09-24 DIAGNOSIS — Z5112 Encounter for antineoplastic immunotherapy: Secondary | ICD-10-CM | POA: Diagnosis not present

## 2021-09-24 DIAGNOSIS — Z5189 Encounter for other specified aftercare: Secondary | ICD-10-CM | POA: Diagnosis not present

## 2021-09-24 LAB — COMPREHENSIVE METABOLIC PANEL
ALT: 35 U/L (ref 0–44)
AST: 35 U/L (ref 15–41)
Albumin: 4.2 g/dL (ref 3.5–5.0)
Alkaline Phosphatase: 128 U/L — ABNORMAL HIGH (ref 38–126)
Anion gap: 11 (ref 5–15)
BUN: 8 mg/dL (ref 6–20)
CO2: 24 mmol/L (ref 22–32)
Calcium: 9.1 mg/dL (ref 8.9–10.3)
Chloride: 99 mmol/L (ref 98–111)
Creatinine, Ser: 0.7 mg/dL (ref 0.61–1.24)
GFR, Estimated: >60 mL/min (ref >60)
Glucose, Bld: 122 mg/dL — ABNORMAL HIGH (ref 70–99)
Potassium: 4.4 mmol/L (ref 3.5–5.1)
Sodium: 134 mmol/L — ABNORMAL LOW (ref 135–145)
Total Bilirubin: 1.2 mg/dL (ref 0.3–1.2)
Total Protein: 6.6 g/dL (ref 6.5–8.1)

## 2021-09-24 LAB — CBC WITH DIFFERENTIAL/PLATELET
Abs Immature Granulocytes: 0.27 10*3/uL — ABNORMAL HIGH (ref 0.00–0.07)
Basophils Absolute: 0.1 10*3/uL (ref 0.0–0.1)
Basophils Relative: 2 %
Eosinophils Absolute: 0.1 10*3/uL (ref 0.0–0.5)
Eosinophils Relative: 3 %
HCT: 38.9 % — ABNORMAL LOW (ref 39.0–52.0)
Hemoglobin: 14.1 g/dL (ref 13.0–17.0)
Immature Granulocytes: 7 %
Lymphocytes Relative: 13 %
Lymphs Abs: 0.5 10*3/uL — ABNORMAL LOW (ref 0.7–4.0)
MCH: 34.1 pg — ABNORMAL HIGH (ref 26.0–34.0)
MCHC: 36.2 g/dL — ABNORMAL HIGH (ref 30.0–36.0)
MCV: 94.2 fL (ref 80.0–100.0)
Monocytes Absolute: 0.8 10*3/uL (ref 0.1–1.0)
Monocytes Relative: 22 %
Neutro Abs: 2.1 10*3/uL (ref 1.7–7.7)
Neutrophils Relative %: 53 %
Platelets: 125 10*3/uL — ABNORMAL LOW (ref 150–400)
RBC: 4.13 MIL/uL — ABNORMAL LOW (ref 4.22–5.81)
RDW: 14.5 % (ref 11.5–15.5)
Smear Review: NORMAL
WBC: 3.9 10*3/uL — ABNORMAL LOW (ref 4.0–10.5)
nRBC: 0 % (ref 0.0–0.2)

## 2021-09-24 MED ORDER — NICOTINE 10 MG IN INHA: 1.0000 | RESPIRATORY_TRACT | 0 refills | Status: DC | PRN

## 2021-09-24 MED ORDER — NICOTINE 21 MG/24HR TD PT24: 21.0000 mg | MEDICATED_PATCH | Freq: Every day | TRANSDERMAL | 0 refills | Status: DC

## 2021-09-24 NOTE — Progress Notes (Signed)
Pt doing ok. He is wanting to try to stop cigars and they are small ones and he smokes 10 a day, and drinks 12 beers a day.

## 2021-09-24 NOTE — Progress Notes (Signed)
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 [name]  He sustained a left anterior frontal sinus as well as supraorbital rim fracture on 11/21/2019 which was managed conservatively.  He then presented to the ER at [facility] with symptoms of right-sided chest wall pain this led to a CT PE which did not show any evidence of pulmonary embolism.  He was noted to have bilateral symmetric axillary adenopathy measuring up to 1.8 cm.  Scattered mediastinal adenopathy.  Right paratracheal node measuring up to 1.2 cm.  Prominent right costophrenic lymph node measuring up to 1 cm.  Findings are nonspecific but could be seen with lymphoma versus systemic inflammatory disease such as sarcoidosis.  Of note patient has had a prior CT scan for lung screening back in 2015 when he was not noted to have this adenopathy.    Repeat CT chest in August 2021 showed persistent mild nonbulky axillary and mediastinal adenopathy which had not changed significantly as compared to his prior CT in December 2020.  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   Repeat CT in March 2022 showed Progression and multistation adenopathy as well as significant splenomegaly measuring 20.4 cm as compared to 14.5 cm  on CT scan September 2021.  CT scan showed multistation adenopathy with SUVs ranging between 6-8.9.  Patient underwent another core biopsy of right inguinal lymph node which was consistent with follicular lymphoma grade 1-2.  Bone marrow biopsy also showed moderate involvement with non-Hodgkin's B-cell lymphoma.  Baseline hepatitis B testing showed core antibody positivity.  Seen by GI and started on tenofovir   Patient had symptoms of nausea and sweating during cycle 1 of Rituxan which resolved with additional premedications.  He developed symptoms of itching over neck chest back and bilateral eyes with second dose of Rituxan early and had to get more premedications and was not rechallenged on the same day.  Patient subsequently received cycle 3 of Bendamustine Rituxan chemotherapy after Rituxan was given as a split dose over 3 days   Scans after 4 cycles of Bendamustine Rituxan chemotherapy showedSignificant improvement with therapy.  Lymphadenopathy in his neck and mediastinum resolved Deauville 2.  Decrease in the splenic size and hypermetabolism.  No new lesions.  FDG accumulation within the skeleton presumably due to stimulatory effects of treatment.  Interval history-patient reports doing well although he is still struggling with his smoking and drinking habits.  He would like to try to quit smoking and cut down his alcohol intake.  ECOG PS- 1 Pain scale- 0   Review of systems- Review of Systems  Constitutional:  Negative for chills, fever, malaise/fatigue and weight loss.  HENT:  Negative for congestion, ear discharge and nosebleeds.   Eyes:  Negative for blurred vision.  Respiratory:  Negative for cough, hemoptysis, sputum production, shortness of breath and wheezing.  Cardiovascular:  Negative for chest pain, palpitations, orthopnea and claudication.  Gastrointestinal:  Negative for abdominal pain, blood in stool, constipation, diarrhea, heartburn, melena, nausea and vomiting.   Genitourinary:  Negative for dysuria, flank pain, frequency, hematuria and urgency.  Musculoskeletal:  Negative for back pain, joint pain and myalgias.  Skin:  Negative for rash.  Neurological:  Negative for dizziness, tingling, focal weakness, seizures, weakness and headaches.  Endo/Heme/Allergies:  Does not bruise/bleed easily.  Psychiatric/Behavioral:  Negative for depression and suicidal ideas. The patient does not have insomnia.      Allergies  Allergen Reactions   Penicillins Anaphylaxis    Tolerates cefdinir, cephalexin and amoxicillin/clavulonate so true PCN allergy unlikely   Tetanus Toxoids Swelling   Lisinopril Cough   Losartan      Other reaction(s): Muscle Pain     Tetanus Toxoid    Codeine Nausea Only and Nausea And Vomiting   Doxycycline Rash   Ruxience [Rituximab-Pvvr] Rash    05/06/21 pt w/ worsening rash during Ruxience infusion.  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[place])    Arthritis    Asthma    Basal cell carcinoma 01/28/2021   R upper arm, [facility] 03/04/2021   Brain 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   Chronic pain    right arm   Depression    Environmental and seasonal allergies    Follicular lymphoma ([place])    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   left neck, Moh's 05/29/21   Hypertension    Pneumonia    SCC (squamous cell carcinoma) 08/28/2021   upper chest right of midline, EDC done 09/17/2021   Sleep apnea    does not wear CPAP ; uses humidifier instead   Squamous cell carcinoma of skin 02/19/2021   L inferior mandible, treated with EDC   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/28/2021   R ant shoulder - ED&C   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/28/2021   L upper abdomen - ED&C     Past Surgical History:  Procedure Laterality Date   ANTERIOR CERVICAL DECOMP/DISCECTOMY FUSION N/A 06/17/2016   Procedure: ANTERIOR CERVICAL DECOMPRESSION  FUSION, CERVICAL 3-4, CERVICAL 4-5 WITH INSTRUMENTATION AND ALLOGRAFT;  Surgeon: Phylliss Bob, MD;  Location: [facility];  Service: Orthopedics;  Laterality: N/A;  ANTERIOR CERVICAL DECOMPRESSION FUSION, CERVICAL 3-4, CERVICAL 4-5 WITH INSTRUMENTATION AND ALLOGRAFT   BACK SURGERY     NECK SURGERY  2009   PORTA CATH INSERTION N/A 04/03/2021   Procedure: PORTA CATH INSERTION;  Surgeon: Algernon Huxley, MD;  Location: [place] [facility];  Service: Cardiovascular;  Laterality: N/A;    Social History   Socioeconomic History   Marital status: Divorced    Spouse name: Not on file   Number of children: Not on file   Years of education: Not on file   Highest education level: Not on file  Occupational History   Not on file  Tobacco Use   Smoking status: Every Day    Types: Cigars   Smokeless tobacco: Never   Tobacco comments:    5-6 cigars a day  Vaping Use   Vaping Use: Former   Start date: 04/30/2017   Quit date: 11/30/2017  Substance and Sexual Activity   Alcohol use: Not Currently    Comment: beers 12 a day   Drug use: No   Sexual activity: Not on file  Other Topics Concern   Not on file  Social History Narrative   Not on file   Social Determinants of Health   Financial Resource Strain: Not on file  Food Insecurity: Not on file  Transportation Needs: Not on file  Physical Activity: Not on file  Stress: Not on file  Social Connections: Not on file  Intimate Partner Violence: Not on file    Family History  Problem Relation Age of Onset   Diabetes Mother    Diabetes Other      Current Outpatient Medications:    acetaminophen (TYLENOL) 325 MG tablet, Take 2 tablets (650 mg total) by mouth daily. for 5 days starting 2 days prior to treatment, Disp: 30 tablet, Rfl: 0   acyclovir (ZOVIRAX) 400 MG tablet, TAKE 1 TABLET BY MOUTH TWICE DAILY, Disp: 60 tablet, Rfl: 2   allopurinol (ZYLOPRIM) 300 MG tablet, TAKE 1 TABLET BY MOUTH ONCE DAILY, Disp: 30 tablet, Rfl: 3   atorvastatin  (LIPITOR) 40 MG tablet, Take 1 tablet (40 mg total) by mouth daily., Disp: 90 tablet, Rfl: 4   buPROPion (WELLBUTRIN XL) 150 MG 24 hr tablet, Take 1 tablet by mouth every morning., Disp: , Rfl:    clonazePAM (KLONOPIN) 0.5 MG tablet, Take 0.5 mg by mouth daily., Disp: , Rfl:    diltiazem (CARDIZEM CD) 120 MG 24 hr capsule, Take 1 capsule (120 mg total) by mouth daily., Disp: 30 capsule, Rfl: 5   diphenhydrAMINE (BENADRYL) 25 mg capsule, Take 1 capsule (25 mg total) by mouth daily for 1 dose. Take 1 capsule daily for 5 days starting 2 days prior to treatment, Disp: 30 capsule, Rfl: 0   diphenoxylate-atropine (LOMOTIL) 2.5-0.025 MG tablet, Take 1 tablet by mouth 4 (four) times daily as needed for diarrhea or loose stools., Disp: 30 tablet, Rfl: 0   DULoxetine (CYMBALTA) 60 MG capsule, TAKE 1 CAPSULE EVERY DAY, Disp: 90 capsule, Rfl: 0   famotidine (PEPCID) 20 MG tablet, TAKE 1 TABLET TWICE DAILY, Disp: 180 tablet, Rfl: 0   finasteride (PROSCAR) 5 MG tablet, TAKE 1 TABLET BY MOUTH ONCE DAILY, Disp: 90 tablet, Rfl: 2   lansoprazole (PREVACID) 30 MG capsule, TAKE 1 CAPSULE BY MOUTH ONCE DAILY AT NOON, Disp: 30 capsule, Rfl: 2   loratadine (CLARITIN) 10 MG tablet, Take 10 mg by mouth daily. , Disp: , Rfl:    metoprolol succinate (TOPROL-XL) 50 MG 24 hr tablet, Take 2 tablets (100 mg total) by mouth daily. Take with or immediately following a meal., Disp: 90 tablet, Rfl: 3   mirtazapine (REMERON) 15 MG tablet, Take 15 mg by mouth at bedtime., Disp: , Rfl:    mupirocin ointment (BACTROBAN) 2 %, Apply 1 application topically 2 (two) times daily., Disp: 22 g, Rfl: 0   OLANZapine (ZYPREXA) 7.5 MG tablet, Take 7.5 mg by mouth at bedtime., Disp: , Rfl:    sildenafil (REVATIO) 20 MG tablet, TAKE 3-5 TABLETS BY MOUTH ONCE DAILY AS NEEDED FOR ERECTILE DYSFUNCTION, Disp: 30 tablet, Rfl: 0   tacrolimus (PROTOPIC) 0.1 % ointment, Apply 1 application topically 2 (two) times daily., Disp: , Rfl:    testosterone  cypionate (DEPOTESTOSTERONE CYPIONATE) 200 MG/ML injection, Inject 1 mL (200 mg total) into the muscle every 14 (fourteen) days., Disp: 10 mL, Rfl: 0   triamcinolone (KENALOG) 0.1 %, Apply to affected areas 1-2 times a day until rash improved. Avoid face, groin, underarms., Disp: 80 g, Rfl: 1   nicotine (NICODERM CQ) 21 mg/24hr patch, Place 1 patch (  21 mg total) onto the skin daily., Disp: 28 patch, Rfl: 0   nicotine (NICOTROL) 10 MG inhaler, Inhale 1 Cartridge (1 continuous puffing total) into the lungs as needed for smoking cessation., Disp: 42 each, Rfl: 0   ondansetron (ZOFRAN) 8 MG tablet, Take 1 tablet (8 mg total) by mouth 2 (two) times daily as needed for refractory nausea / vomiting. Start on day 2 after bendamustine chemo. (Patient not taking: Reported on 09/24/2021), Disp: 30 tablet, Rfl: 1   prochlorperazine (COMPAZINE) 10 MG tablet, TAKE 1 TABLET BY MOUTH EVERY 6 HOURS AS NEEDED NAUSEA AND VOMITING (Patient not taking: Reported on 09/24/2021), Disp: 30 tablet, Rfl: 1 No current facility-administered medications for this visit.  Facility-Administered Medications Ordered in Other Visits:    heparin lock flush 100 unit/mL, 500 Units, Intravenous, Once, Sindy Guadeloupe, MD   heparin lock flush 100 unit/mL, 500 Units, Intravenous, Once, Sindy Guadeloupe, MD   sodium chloride flush (NS) 0.9 % injection 10 mL, 10 mL, Intravenous, PRN, Sindy Guadeloupe, MD  Physical exam:  Vitals:   09/24/21 1102  BP: 120/90  Pulse: 100  Resp: 16  Temp: 99.3 F (37.4 C)  TempSrc: Oral  Weight: 229 lb (103.9 kg)  Height: _0  (1.88 m)   Physical Exam Cardiovascular:     Rate and Rhythm: Regular rhythm. Tachycardia present.     Heart sounds: Normal heart sounds.  Pulmonary:     Effort: Pulmonary effort is normal.     Breath sounds: Normal breath sounds.  Skin:    General: Skin is warm and dry.  Neurological:     Mental Status: He is alert and oriented to person, place, and time.     CMP Latest  Ref Rng & Units 09/24/2021  Glucose 70 - 99 mg/dL 122(H)  BUN 6 - 20 mg/dL 8  Creatinine 0.61 - 1.24 mg/dL 0.70  Sodium 135 - 145 mmol/L 134(L)  Potassium 3.5 - 5.1 mmol/L 4.4  Chloride 98 - 111 mmol/L 99  CO2 22 - 32 mmol/L 24  Calcium 8.9 - 10.3 mg/dL 9.1  Total Protein 6.5 - 8.1 g/dL 6.6  Total Bilirubin 0.3 - 1.2 mg/dL 1.2  Alkaline Phos 38 - 126 U/L 128(H)  AST 15 - 41 U/L 35  ALT 0 - 44 U/L 35   CBC Latest Ref Rng & Units 09/24/2021  WBC 4.0 - 10.5 K/uL 3.9(L)  Hemoglobin 13.0 - 17.0 g/dL 14.1  Hematocrit 39.0 - 52.0 % 38.9(L)  Platelets 150 - 400 K/uL 125(L)    No images are attached to the encounter.  NM PET Image Restag (PS) Skull Base To Thigh  Result Date: 09/24/2021 CLINICAL DATA:  Subsequent treatment strategy for low-grade follicular lymphoma. EXAM: NUCLEAR MEDICINE PET SKULL BASE TO THIGH TECHNIQUE: 12.3 mCi F-18 FDG was injected intravenously. Full-ring PET imaging was performed from the skull base to thigh after the radiotracer. CT data was obtained and used for attenuation correction and anatomic localization. Fasting blood glucose: 180 mg/dl COMPARISON:  07/22/2021 FINDINGS: Mediastinal blood pool activity: SUV max 1.0 Liver activity: SUV max 2.3 NECK: No hypermetabolic lymph nodes in the neck. Incidental CT findings: Port in the anterior chest wall with tip in distal SVC. CHEST: No hypermetabolic mediastinal or hilar nodes. Incidental CT findings: 9 mm nodule in the LEFT lower lobe (image 146/series 3) compares to 9 mm on prior. No metabolic activity. Similar nodule at the RIGHT lung base measuring 5 mm (image 141) also unchanged and without metabolic  activity. ABDOMEN/PELVIS: No abnormal hypermetabolic activity within the liver, pancreas, adrenal glands, or spleen. No hypermetabolic lymph nodes in the abdomen or pelvis. Spleen is normal size and normal metabolic activity. Incidental CT findings: none SKELETON: No focal hypermetabolic activity to suggest skeletal  metastasis. Incidental CT findings: Physiologic muscle activity in gluteus muscles. IMPRESSION: 1. No evidence of lymphoma recurrence on skull base to thigh FDG PET scan. 2. No enlarged lymph nodes or metabolically active lymph nodes. Deauville 1 3. Pulmonary nodules in the in the medial lung bases not changed from prior and without metabolic activity. Electronically Signed   By: Suzy Bouchard M.D.   On: 09/24/2021 08:25   LONG TERM MONITOR (3-14 DAYS)  Result Date: 09/04/2021 Patch Wear Time:  12 days and 17 hours (2022-09-16T15:54:57-0400 to 2022-09-29T09:17:35-0400) Patient had a min HR of 78 bpm, max HR of 156 bpm, and avg HR of 114 bpm. Predominant underlying rhythm was Sinus Rhythm. 1 run of Ventricular Tachycardia occurred lasting 7 beats with a max rate of 135 bpm (avg 119 bpm). Isolated SVEs were rare (<1.0%),  SVE Couplets were rare (<1.0%), and SVE Triplets were rare (<1.0%). Isolated VEs were rare (<1.0%), VE Couplets were rare (<1.0%), and no VE Triplets were present. Overall benign cardiac monitor with no significant arrhythmias.    Assessment and plan- Patient is a 60 y.o. male with stage IV follicular lymphoma grade 1 s/p 6 cycles of Bendamustine Rituxan chemotherapy here to discuss PET CT scan results and further management  PET/CT after 6 cycles of chemotherapy shows no evidence of active lymphoma.  PreviouslySeen splenomegaly has now normalized with no hypermetabolism noted in the spleen.  He is currently in remission.  I do not plan to offer him any maintenance Rituxan at this time.  Surveillance will be CT chest abdomen and pelvis every 6 months for up to 2 years.  I will also see him back in 3 months with port labs CBC with differential CMP and LDH.  I will leave his port in for now.  Discussed with patient and his mother that follicular lymphoma can recur in the future as well but treatment would be warranted only if he has bulky adenopathy or B symptoms.    With regards to  smoking cessation patient will be seen in smoking cessation clinic today.   Visit Diagnosis 1. 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 Randa Evens, MD, MPH [facility] at [facility] [phone] 09/24/2021 1:52 PM

## 2021-09-24 NOTE — Progress Notes (Signed)
Smoking Cessation [place]  [phone] [phone]  Patient Care Team: Charlynne Cousins, MD as PCP - General (Internal Medicine) Kate Sable, MD as PCP - Cardiology (Cardiology) Sindy Guadeloupe, MD as Consulting Physician (Oncology)   Name of the patient: Russell Simpson  [id number]  16-Apr-1961   Date of visit: 09/24/2021   Diagnosis- Lung Nodule  Chief complaint/ Reason for visit- Smoking Cessation counseling  PMH-Russell Simpson is a 60 year old male with past medical history significant for hypertension, hyperlipidemia, hypogonad is him, pituitary adenoma who sustained a fall in December 2020 sustaining frontal sinus and suborbital rim fracture which was managed conservatively.  He was evaluated for right chest wall pain shortly after his fall prompting additional imaging which noted bilateral axillary adenopathy with scattered mediastinal adenopathy worrisome for lymphoma versus inflammation.  Repeat imaging from August 2021 showed persistent mild nonbulky axillary mediastinal adenopathy that not significantly changed compared to December 2020.  Biopsy of axillary lymph nodes were consistent with low-grade follicular lymphoma.  Imaging in March 2022 showed progression with splenomegaly.  Bone marrow showed moderate involvement with non-Hodgkin's B-cell lymphoma.  He is status post 4 cycles of Bendamustine and Rituxan chemotherapy with improvement of lymphadenopathy and decrease in spleen and hypermetabolism.   Interval history- Russell Simpson is a 60 yo patient who presents to smoking cessation clinic to discuss strategies and techniques to quit smoking.  The goal of this program is to implement proven ineffective interventions in a clinical setting to successfully quit smoking.   During our visit today, we will discuss strategies for tobacco cessation.   Smoking history: Tobacco Use: High Risk   Smoking Tobacco Use: Every Day    Smokeless Tobacco Use: Never   Passive Exposure: Not on file     Type of tobacco:   Cigars-8-10 cigarillos daily over the past 20 years.   Approximate date of last quit attempt:  He has tried quitting intermittently and has been most successful quitting cold [place].  He has been started on Wellbutrin for smoking cessation without significant   Readiness to quit: would like to quit now or soon  Other smokers in household: No   Plan: Quit date: Undetermined  Today, patient is doing well.  He is interested in cutting back to quit over the next month or so.  Denies any concerns at this time.  ECOG FS:0 - Asymptomatic  Review of systems- Review of Systems  All other systems reviewed and are negative.    Allergies  Allergen Reactions   Penicillins Anaphylaxis    Tolerates cefdinir, cephalexin and amoxicillin/clavulonate so true PCN allergy unlikely   Tetanus Toxoids Swelling   Lisinopril Cough   Losartan      Other reaction(s): Muscle Pain     Tetanus Toxoid    Codeine Nausea Only and Nausea And Vomiting   Doxycycline Rash   Ruxience [Rituximab-Pvvr] Rash    05/06/21 pt w/ worsening rash during Ruxience infusion.  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[place])    Arthritis    Asthma    Basal cell carcinoma 01/28/2021   R upper arm, EDC 03/04/2021   Brain 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   Chronic pain    right arm   Depression    Environmental and seasonal allergies    Follicular lymphoma ([place])    History of SCC (squamous cell  carcinoma) of skin 05/29/2021   left neck, Moh's 05/29/21   Hypertension    Pneumonia    SCC (squamous cell carcinoma) 08/28/2021   upper chest right of midline, EDC done 09/17/2021   Sleep apnea    does not wear CPAP ; uses humidifier instead   Squamous cell carcinoma of skin 02/19/2021   L inferior mandible, treated with EDC   Squamous cell carcinoma  of skin 08/28/2021   R ant shoulder - ED&C   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/28/2021   L upper abdomen - ED&C     Past Surgical History:  Procedure Laterality Date   ANTERIOR CERVICAL DECOMP/DISCECTOMY FUSION N/A 06/17/2016   Procedure: ANTERIOR CERVICAL DECOMPRESSION FUSION, CERVICAL 3-4, CERVICAL 4-5 WITH INSTRUMENTATION AND ALLOGRAFT;  Surgeon: Phylliss Bob, MD;  Location: [facility];  Service: Orthopedics;  Laterality: N/A;  ANTERIOR CERVICAL DECOMPRESSION FUSION, CERVICAL 3-4, CERVICAL 4-5 WITH INSTRUMENTATION AND ALLOGRAFT   BACK SURGERY     NECK SURGERY  2009   PORTA CATH INSERTION N/A 04/03/2021   Procedure: PORTA CATH INSERTION;  Surgeon: Algernon Huxley, MD;  Location: [facility];  Service: Cardiovascular;  Laterality: N/A;    Social History   Socioeconomic History   Marital status: Divorced    Spouse name: Not on file   Number of children: Not on file   Years of education: Not on file   Highest education level: Not on file  Occupational History   Not on file  Tobacco Use   Smoking status: Every Day    Types: Cigars   Smokeless tobacco: Never   Tobacco comments:    5-6 cigars a day  Vaping Use   Vaping Use: Former   Start date: 04/30/2017   Quit date: 11/30/2017  Substance and Sexual Activity   Alcohol use: Not Currently    Comment: beers 12 a day   Drug use: No   Sexual activity: Not on file  Other Topics Concern   Not on file  Social History Narrative   Not on file   Social Determinants of Health   Financial Resource Strain: Not on file  Food Insecurity: Not on file  Transportation Needs: Not on file  Physical Activity: Not on file  Stress: Not on file  Social Connections: Not on file  Intimate Partner Violence: Not on file    Family History  Problem Relation Age of Onset   Diabetes Mother    Diabetes Other      Current Outpatient Medications:    acetaminophen (TYLENOL) 325 MG tablet, Take 2 tablets (650 mg total) by mouth daily. for 5 days  starting 2 days prior to treatment, Disp: 30 tablet, Rfl: 0   acyclovir (ZOVIRAX) 400 MG tablet, TAKE 1 TABLET BY MOUTH TWICE DAILY, Disp: 60 tablet, Rfl: 2   allopurinol (ZYLOPRIM) 300 MG tablet, TAKE 1 TABLET BY MOUTH ONCE DAILY, Disp: 30 tablet, Rfl: 3   atorvastatin (LIPITOR) 40 MG tablet, Take 1 tablet (40 mg total) by mouth daily., Disp: 90 tablet, Rfl: 4   buPROPion (WELLBUTRIN XL) 150 MG 24 hr tablet, Take 1 tablet by mouth every morning., Disp: , Rfl:    clonazePAM (KLONOPIN) 0.5 MG tablet, Take 0.5 mg by mouth daily., Disp: , Rfl:    diltiazem (CARDIZEM CD) 120 MG 24 hr capsule, Take 1 capsule (120 mg total) by mouth daily., Disp: 30 capsule, Rfl: 5   diphenhydrAMINE (BENADRYL) 25 mg capsule, Take 1 capsule (25 mg total) by mouth daily  for 1 dose. Take 1 capsule daily for 5 days starting 2 days prior to treatment, Disp: 30 capsule, Rfl: 0   diphenoxylate-atropine (LOMOTIL) 2.5-0.025 MG tablet, Take 1 tablet by mouth 4 (four) times daily as needed for diarrhea or loose stools., Disp: 30 tablet, Rfl: 0   DULoxetine (CYMBALTA) 60 MG capsule, TAKE 1 CAPSULE EVERY DAY, Disp: 90 capsule, Rfl: 0   famotidine (PEPCID) 20 MG tablet, TAKE 1 TABLET TWICE DAILY, Disp: 180 tablet, Rfl: 0   finasteride (PROSCAR) 5 MG tablet, TAKE 1 TABLET BY MOUTH ONCE DAILY, Disp: 90 tablet, Rfl: 2   lansoprazole (PREVACID) 30 MG capsule, TAKE 1 CAPSULE BY MOUTH ONCE DAILY AT NOON, Disp: 30 capsule, Rfl: 2   loratadine (CLARITIN) 10 MG tablet, Take 10 mg by mouth daily. , Disp: , Rfl:    metoprolol succinate (TOPROL-XL) 50 MG 24 hr tablet, Take 2 tablets (100 mg total) by mouth daily. Take with or immediately following a meal., Disp: 90 tablet, Rfl: 3   mirtazapine (REMERON) 15 MG tablet, Take 15 mg by mouth at bedtime., Disp: , Rfl:    mupirocin ointment (BACTROBAN) 2 %, Apply 1 application topically 2 (two) times daily., Disp: 22 g, Rfl: 0   OLANZapine (ZYPREXA) 7.5 MG tablet, Take 7.5 mg by mouth at bedtime., Disp:  , Rfl:    ondansetron (ZOFRAN) 8 MG tablet, Take 1 tablet (8 mg total) by mouth 2 (two) times daily as needed for refractory nausea / vomiting. Start on day 2 after bendamustine chemo. (Patient not taking: Reported on 09/24/2021), Disp: 30 tablet, Rfl: 1   prochlorperazine (COMPAZINE) 10 MG tablet, TAKE 1 TABLET BY MOUTH EVERY 6 HOURS AS NEEDED NAUSEA AND VOMITING (Patient not taking: Reported on 09/24/2021), Disp: 30 tablet, Rfl: 1   sildenafil (REVATIO) 20 MG tablet, TAKE 3-5 TABLETS BY MOUTH ONCE DAILY AS NEEDED FOR ERECTILE DYSFUNCTION, Disp: 30 tablet, Rfl: 0   tacrolimus (PROTOPIC) 0.1 % ointment, Apply 1 application topically 2 (two) times daily., Disp: , Rfl:    testosterone cypionate (DEPOTESTOSTERONE CYPIONATE) 200 MG/ML injection, Inject 1 mL (200 mg total) into the muscle every 14 (fourteen) days., Disp: 10 mL, Rfl: 0   triamcinolone (KENALOG) 0.1 %, Apply to affected areas 1-2 times a day until rash improved. Avoid face, groin, underarms., Disp: 80 g, Rfl: 1 No current facility-administered medications for this visit.  Facility-Administered Medications Ordered in Other Visits:    heparin lock flush 100 unit/mL, 500 Units, Intravenous, Once, Sindy Guadeloupe, MD   heparin lock flush 100 unit/mL, 500 Units, Intravenous, Once, Sindy Guadeloupe, MD   sodium chloride flush (NS) 0.9 % injection 10 mL, 10 mL, Intravenous, PRN, Sindy Guadeloupe, MD  Physical exam: There were no vitals filed for this visit. Physical Exam Constitutional:      Appearance: Normal appearance. He is obese.  Cardiovascular:     Rate and Rhythm: Normal rate and regular rhythm.  Pulmonary:     Breath sounds: Normal breath sounds.  Abdominal:     General: Bowel sounds are normal.  Musculoskeletal:        General: Normal range of motion.  Neurological:     Mental Status: He is alert.     CMP Latest Ref Rng & Units 09/24/2021  Glucose 70 - 99 mg/dL 122(H)  BUN 6 - 20 mg/dL 8  Creatinine 0.61 - 1.24 mg/dL 0.70   Sodium 135 - 145 mmol/L 134(L)  Potassium 3.5 - 5.1 mmol/L 4.4  Chloride 98 -  111 mmol/L 99  CO2 22 - 32 mmol/L 24  Calcium 8.9 - 10.3 mg/dL 9.1  Total Protein 6.5 - 8.1 g/dL 6.6  Total Bilirubin 0.3 - 1.2 mg/dL 1.2  Alkaline Phos 38 - 126 U/L 128(H)  AST 15 - 41 U/L 35  ALT 0 - 44 U/L 35   CBC Latest Ref Rng & Units 09/24/2021  WBC 4.0 - 10.5 K/uL 3.9(L)  Hemoglobin 13.0 - 17.0 g/dL 14.1  Hematocrit 39.0 - 52.0 % 38.9(L)  Platelets 150 - 400 K/uL 125(L)    No images are attached to the encounter.  NM PET Image Restag (PS) Skull Base To Thigh  Result Date: 09/24/2021 CLINICAL DATA:  Subsequent treatment strategy for low-grade follicular lymphoma. EXAM: NUCLEAR MEDICINE PET SKULL BASE TO THIGH TECHNIQUE: 12.3 mCi F-18 FDG was injected intravenously. Full-ring PET imaging was performed from the skull base to thigh after the radiotracer. CT data was obtained and used for attenuation correction and anatomic localization. Fasting blood glucose: 180 mg/dl COMPARISON:  07/22/2021 FINDINGS: Mediastinal blood pool activity: SUV max 1.0 Liver activity: SUV max 2.3 NECK: No hypermetabolic lymph nodes in the neck. Incidental CT findings: Port in the anterior chest wall with tip in distal SVC. CHEST: No hypermetabolic mediastinal or hilar nodes. Incidental CT findings: 9 mm nodule in the LEFT lower lobe (image 146/series 3) compares to 9 mm on prior. No metabolic activity. Similar nodule at the RIGHT lung base measuring 5 mm (image 141) also unchanged and without metabolic activity. ABDOMEN/PELVIS: No abnormal hypermetabolic activity within the liver, pancreas, adrenal glands, or spleen. No hypermetabolic lymph nodes in the abdomen or pelvis. Spleen is normal size and normal metabolic activity. Incidental CT findings: none SKELETON: No focal hypermetabolic activity to suggest skeletal metastasis. Incidental CT findings: Physiologic muscle activity in gluteus muscles. IMPRESSION: 1. No evidence of  lymphoma recurrence on skull base to thigh FDG PET scan. 2. No enlarged lymph nodes or metabolically active lymph nodes. [place] 1 3. Pulmonary nodules in the in the medial lung bases not changed from prior and without metabolic activity. Electronically Signed   By: Suzy Bouchard M.D.   On: 09/24/2021 08:25   LONG TERM MONITOR (3-14 DAYS)  Result Date: 09/04/2021 Patch Wear Time:  12 days and 17 hours (2022-09-16T15:54:57-0400 to 2022-09-29T09:17:35-0400) Patient had a min HR of 78 bpm, max HR of 156 bpm, and avg HR of 114 bpm. Predominant underlying rhythm was Sinus Rhythm. 1 run of Ventricular Tachycardia occurred lasting 7 beats with a max rate of 135 bpm (avg 119 bpm). Isolated SVEs were rare (<1.0%),  SVE Couplets were rare (<1.0%), and SVE Triplets were rare (<1.0%). Isolated VEs were rare (<1.0%), VE Couplets were rare (<1.0%), and no VE Triplets were present. Overall benign cardiac monitor with no significant arrhythmias.    Assessment and plan- Patient is a 60 y.o. malewho presents to smoking cessation clinic to discuss strategies, medications and interventions needed to quit smoking.  Topics covered: Tobacco improve having car Change in daily routines Dealing with urges to smoke Getting support Anticipating/avoiding triggers Secondhand smoke Behavioral skills Reinforced benefits  Reviewed pharmacotherapy: OTC medications include:  Nicotine replacement therapy (NRT) gum  NRT lozenge  NRT patch  NRT patch plus combination of gum or lozenge  Medical treatment:  NRT nasal spray: Dosing 1-2 doses per hour (8-40 doses per day); 1 dose equals 1 spray in each nostril; each spray level 0.5 mg of nicotine  NRT oral inhaler: Dosing 6-16 cartridges per day; initially  use 1 cartridge every 1-2 hours  Bupropion SR: Dosing: Begin 1 to 2 weeks prior to quit date; 150 mg by mouth every a.m. x3 days, then increase to 150 mg by mouth twice daily.  Contraindications: Head injury, seizures,  eating disorders, MAOI inhibitor.  Verenicline: Dosing: Begin 1 week prior to quit date; days 1 through 3 0.5 mg by mouth every a.m.; days 4 through 7; 0.5 mg p.o. twice daily; weeks 2 through 12: 1 mg p.o. twice daily.  Monitor for neuropsychiatric symptoms.  Follow-up plan: Based on our discussion today, we have decided to initiate a nicotine patch and nicotine oral inhaler.  Both prescriptions were sent to his pharmacy.  We will touch base in 3 to 4 weeks to see if he has made progress on reducing the amount of cigarettes he is smoking.  In the past, Wellbutrin has not worked for him.   Disposition: RTC in 3 to 4 weeks for telephone call to assess tolerability of nicotine patch and inhaler.   Visit Diagnosis 1. Personal history of tobacco use, presenting hazards to health     Patient expressed understanding and was in agreement with this plan. He also understands that He can call clinic at any time with any questions, concerns, or complaints.   Greater than 50% was spent in counseling and coordination of care with this patient including but not limited to discussion of the relevant topics above (See A&P) including, but not limited to diagnosis and management of acute and chronic medical conditions.   Thank you for allowing me to participate in the care of this very pleasant patient.    Jacquelin Hawking, NP [place] [facility] Cell - [phone] Pager- [phone] 09/24/2021 1:03 PM

## 2021-09-26 ENCOUNTER — Telehealth: Payer: Self-pay | Admitting: *Deleted

## 2021-09-26 NOTE — Telephone Encounter (Signed)
Pharmacy called reporting that patient pharmacy will not cover the Nicotrol inhaler, but will cover the nasal spray. Also insurance will not cover the Nicoderm patches, but will cover Chantix. Please return their call or send new prescription for this to them

## 2021-09-29 ENCOUNTER — Encounter: Payer: Self-pay | Admitting: Oncology

## 2021-09-29 ENCOUNTER — Other Ambulatory Visit: Payer: Self-pay | Admitting: Oncology

## 2021-09-29 MED ORDER — NICOTINE 10 MG/ML NA SOLN: 1.0000 mg | Freq: Four times a day (QID) | NASAL | 2 refills | Status: DC | PRN

## 2021-09-29 MED ORDER — NICOTINE 10 MG IN INHA: 1.0000 | RESPIRATORY_TRACT | 0 refills | Status: DC | PRN

## 2021-09-29 NOTE — Telephone Encounter (Signed)
Okay I will see what I did.   Faythe Casa, NP 09/29/2021 2:06 PM

## 2021-09-29 NOTE — Telephone Encounter (Signed)
New RX sent for nasal spray. Can try OTC nicotine patches initially.   Faythe Casa, NP 09/29/2021 12:34 PM

## 2021-09-29 NOTE — Telephone Encounter (Signed)
Incoming message from answering service that pharmacy called and said wrong prescription was sent and patient needs Nicotrol Nasal cartridges not inhaler. Please resubmit prescription.

## 2021-10-03 DIAGNOSIS — F3162 Bipolar disorder, current episode mixed, moderate: Secondary | ICD-10-CM | POA: Diagnosis not present

## 2021-10-03 DIAGNOSIS — F410 Panic disorder [episodic paroxysmal anxiety] without agoraphobia: Secondary | ICD-10-CM | POA: Diagnosis not present

## 2021-10-03 DIAGNOSIS — F1010 Alcohol abuse, uncomplicated: Secondary | ICD-10-CM | POA: Diagnosis not present

## 2021-10-18 ENCOUNTER — Other Ambulatory Visit: Payer: Self-pay | Admitting: Internal Medicine

## 2021-10-18 DIAGNOSIS — I10 Essential (primary) hypertension: Secondary | ICD-10-CM

## 2021-10-18 NOTE — Telephone Encounter (Signed)
called [name] Spoke with Sam (Pharmacist) that dose was dc'd 08/15/21- refused refill

## 2021-10-21 ENCOUNTER — Other Ambulatory Visit: Payer: Self-pay | Admitting: Family Medicine

## 2021-10-21 DIAGNOSIS — R059 Cough, unspecified: Secondary | ICD-10-CM

## 2021-10-21 DIAGNOSIS — I10 Essential (primary) hypertension: Secondary | ICD-10-CM

## 2021-10-21 DIAGNOSIS — F410 Panic disorder [episodic paroxysmal anxiety] without agoraphobia: Secondary | ICD-10-CM

## 2021-10-21 DIAGNOSIS — F3160 Bipolar disorder, current episode mixed, unspecified: Secondary | ICD-10-CM

## 2021-10-21 DIAGNOSIS — K219 Gastro-esophageal reflux disease without esophagitis: Secondary | ICD-10-CM

## 2021-10-21 DIAGNOSIS — F4310 Post-traumatic stress disorder, unspecified: Secondary | ICD-10-CM

## 2021-10-22 NOTE — Telephone Encounter (Signed)
Patient is no longer a patient of [facility]. He has established care  with [facility].

## 2021-10-24 ENCOUNTER — Other Ambulatory Visit: Payer: Self-pay | Admitting: Pulmonary Disease

## 2021-10-27 ENCOUNTER — Ambulatory Visit (INDEPENDENT_AMBULATORY_CARE_PROVIDER_SITE_OTHER): Payer: Medicare HMO | Admitting: *Deleted

## 2021-10-27 ENCOUNTER — Telehealth (INDEPENDENT_AMBULATORY_CARE_PROVIDER_SITE_OTHER): Payer: Medicare HMO | Admitting: Internal Medicine

## 2021-10-27 ENCOUNTER — Telehealth: Payer: Self-pay | Admitting: Internal Medicine

## 2021-10-27 DIAGNOSIS — R0981 Nasal congestion: Secondary | ICD-10-CM

## 2021-10-27 DIAGNOSIS — Z0000 Encounter for general adult medical examination without abnormal findings: Secondary | ICD-10-CM

## 2021-10-27 DIAGNOSIS — J209 Acute bronchitis, unspecified: Secondary | ICD-10-CM | POA: Diagnosis not present

## 2021-10-27 MED ORDER — ALBUTEROL SULFATE HFA 108 (90 BASE) MCG/ACT IN AERS: 2.0000 | INHALATION_SPRAY | Freq: Four times a day (QID) | RESPIRATORY_TRACT | 0 refills | Status: DC | PRN

## 2021-10-27 MED ORDER — METHYLPREDNISOLONE 4 MG PO TBPK: ORAL_TABLET | ORAL | 0 refills | Status: DC

## 2021-10-27 MED ORDER — AZITHROMYCIN 250 MG PO TABS: ORAL_TABLET | ORAL | 0 refills | Status: AC

## 2021-10-27 MED ORDER — FEXOFENADINE HCL 180 MG PO TABS: 180.0000 mg | ORAL_TABLET | Freq: Every day | ORAL | 1 refills | Status: DC

## 2021-10-27 NOTE — Telephone Encounter (Signed)
Patient needs telephone call instead of video..today at 4:20

## 2021-10-27 NOTE — Progress Notes (Signed)
There were no vitals taken for this visit.   Subjective:    Patient ID: Russell Simpson, male    DOB: Apr 06, 1961, 60 y.o.   MRN: [id number]  No chief complaint on file.   HPI: Russell Simpson is a 60 y.o. male   This visit was completed via telephone due to the restrictions of the COVID-19 pandemic. All issues as above were discussed and addressed but no physical exam was performed. If it was felt that the patient should be evaluated in the office, they were directed there. The patient verbally consented to this visit. Patient was unable to complete an audio/visual visit due to Technical difficulties. Due to the catastrophic nature of the COVID-19 pandemic, this visit was done through audio contact only. Location of the patient: home Location of the provider: work Those involved with this call:  Provider: Charlynne Cousins, MD CMA: Frazier Butt, [place] Desk/Registration: Myrlene Broker  Time spent on call: 10 minutes on the phone discussing health concerns. 10 minutes total spent in review of patient's record and preparation of their chart.    Cough This is a new (started friday last night was worse and coughing up phlegm per pt has had a post nasal drip.) problem. The current episode started in the past 7 days. Associated symptoms include nasal congestion, postnasal drip and rhinorrhea. Pertinent negatives include no chest pain, chills, ear congestion, ear pain, fever, headaches, heartburn, hemoptysis, myalgias, sore throat, shortness of breath, sweats, weight loss or wheezing. Associated symptoms comments: rUnny nose has been taking otc mucinex. No fever .   No chief complaint on file.   Relevant past medical, surgical, family and social history reviewed and updated as indicated. Interim medical history since our last visit reviewed. Allergies and medications reviewed and updated.  Review of Systems  Constitutional:  Negative for chills, fever and weight loss.  HENT:  Positive for  postnasal drip and rhinorrhea. Negative for ear pain and sore throat.   Respiratory:  Positive for cough. Negative for hemoptysis, shortness of breath and wheezing.   Cardiovascular:  Negative for chest pain.  Gastrointestinal:  Negative for heartburn.  Musculoskeletal:  Negative for myalgias.  Neurological:  Negative for headaches.   Per HPI unless specifically indicated above     Objective:    There were no vitals taken for this visit.  Wt Readings from Last 3 Encounters:  09/24/21 229 lb (103.9 kg)  09/19/21 224 lb (101.6 kg)  09/09/21 226 lb 11.2 oz (102.8 kg)    Physical Exam  Unable to peform sec to virtual visit.   Results for orders placed or performed in visit on 09/24/21  Comprehensive metabolic panel  Result Value Ref Range   Sodium 134 (L) 135 - 145 mmol/L   Potassium 4.4 3.5 - 5.1 mmol/L   Chloride 99 98 - 111 mmol/L   CO2 24 22 - 32 mmol/L   Glucose, Bld 122 (H) 70 - 99 mg/dL   BUN 8 6 - 20 mg/dL   Creatinine, Ser 0.70 0.61 - 1.24 mg/dL   Calcium 9.1 8.9 - 10.3 mg/dL   Total Protein 6.6 6.5 - 8.1 g/dL   Albumin 4.2 3.5 - 5.0 g/dL   AST 35 15 - 41 U/L   ALT 35 0 - 44 U/L   Alkaline Phosphatase 128 (H) 38 - 126 U/L   Total Bilirubin 1.2 0.3 - 1.2 mg/dL   GFR, Estimated >60 >60 mL/min   Anion gap 11 5 - 15  CBC with  Differential  Result Value Ref Range   WBC 3.9 (L) 4.0 - 10.5 K/uL   RBC 4.13 (L) 4.22 - 5.81 MIL/uL   Hemoglobin 14.1 13.0 - 17.0 g/dL   HCT 38.9 (L) 39.0 - 52.0 %   MCV 94.2 80.0 - 100.0 fL   MCH 34.1 (H) 26.0 - 34.0 pg   MCHC 36.2 (H) 30.0 - 36.0 g/dL   RDW 14.5 11.5 - 15.5 %   Platelets 125 (L) 150 - 400 K/uL   nRBC 0.0 0.0 - 0.2 %   Neutrophils Relative % 53 %   Neutro Abs 2.1 1.7 - 7.7 K/uL   Lymphocytes Relative 13 %   Lymphs Abs 0.5 (L) 0.7 - 4.0 K/uL   Monocytes Relative 22 %   Monocytes Absolute 0.8 0.1 - 1.0 K/uL   Eosinophils Relative 3 %   Eosinophils Absolute 0.1 0.0 - 0.5 K/uL   Basophils Relative 2 %   Basophils  Absolute 0.1 0.0 - 0.1 K/uL   WBC Morphology MILD LEFT SHIFT (1-5% METAS, OCC MYELO, OCC BANDS)    RBC Morphology MORPHOLOGY UNREMARKABLE    Smear Review Normal platelet morphology    Immature Granulocytes 7 %   Abs Immature Granulocytes 0.27 (H) 0.00 - 0.07 K/uL        Current Outpatient Medications:  .  acetaminophen (TYLENOL) 325 MG tablet, Take 2 tablets (650 mg total) by mouth daily. for 5 days starting 2 days prior to treatment, Disp: 30 tablet, Rfl: 0 .  acyclovir (ZOVIRAX) 400 MG tablet, TAKE 1 TABLET BY MOUTH TWICE DAILY, Disp: 60 tablet, Rfl: 2 .  allopurinol (ZYLOPRIM) 300 MG tablet, TAKE 1 TABLET BY MOUTH ONCE DAILY, Disp: 30 tablet, Rfl: 3 .  atorvastatin (LIPITOR) 40 MG tablet, Take 1 tablet (40 mg total) by mouth daily., Disp: 90 tablet, Rfl: 4 .  buPROPion (WELLBUTRIN XL) 150 MG 24 hr tablet, Take 1 tablet by mouth every morning., Disp: , Rfl:  .  clonazePAM (KLONOPIN) 0.5 MG tablet, Take 0.5 mg by mouth daily., Disp: , Rfl:  .  diltiazem (CARDIZEM CD) 120 MG 24 hr capsule, Take 1 capsule (120 mg total) by mouth daily., Disp: 30 capsule, Rfl: 5 .  diphenhydrAMINE (BENADRYL) 25 mg capsule, Take 1 capsule (25 mg total) by mouth daily for 1 dose. Take 1 capsule daily for 5 days starting 2 days prior to treatment, Disp: 30 capsule, Rfl: 0 .  diphenoxylate-atropine (LOMOTIL) 2.5-0.025 MG tablet, Take 1 tablet by mouth 4 (four) times daily as needed for diarrhea or loose stools., Disp: 30 tablet, Rfl: 0 .  DULoxetine (CYMBALTA) 60 MG capsule, TAKE 1 CAPSULE EVERY DAY, Disp: 90 capsule, Rfl: 0 .  famotidine (PEPCID) 20 MG tablet, TAKE 1 TABLET TWICE DAILY, Disp: 180 tablet, Rfl: 0 .  finasteride (PROSCAR) 5 MG tablet, TAKE 1 TABLET BY MOUTH ONCE DAILY, Disp: 90 tablet, Rfl: 2 .  lansoprazole (PREVACID) 30 MG capsule, TAKE 1 CAPSULE BY MOUTH ONCE DAILY AT NOON, Disp: 30 capsule, Rfl: 2 .  loratadine (CLARITIN) 10 MG tablet, Take 10 mg by mouth daily. , Disp: , Rfl:  .  metoprolol  succinate (TOPROL-XL) 50 MG 24 hr tablet, Take 2 tablets (100 mg total) by mouth daily. Take with or immediately following a meal., Disp: 90 tablet, Rfl: 3 .  mirtazapine (REMERON) 15 MG tablet, Take 15 mg by mouth at bedtime., Disp: , Rfl:  .  mupirocin ointment (BACTROBAN) 2 %, Apply 1 application topically 2 (two) times daily.,  Disp: 22 g, Rfl: 0 .  nicotine (NICODERM CQ) 21 mg/24hr patch, Place 1 patch (21 mg total) onto the skin daily., Disp: 28 patch, Rfl: 0 .  Nicotine 10 MG/ML SOLN, Place 0.1 mLs (1 mg total) into the nose 4 (four) times daily as needed., Disp: 10 mL, Rfl: 2 .  OLANZapine (ZYPREXA) 7.5 MG tablet, Take 7.5 mg by mouth at bedtime., Disp: , Rfl:  .  ondansetron (ZOFRAN) 8 MG tablet, Take 1 tablet (8 mg total) by mouth 2 (two) times daily as needed for refractory nausea / vomiting. Start on day 2 after bendamustine chemo. (Patient not taking: Reported on 09/24/2021), Disp: 30 tablet, Rfl: 1 .  prochlorperazine (COMPAZINE) 10 MG tablet, TAKE 1 TABLET BY MOUTH EVERY 6 HOURS AS NEEDED NAUSEA AND VOMITING (Patient not taking: Reported on 09/24/2021), Disp: 30 tablet, Rfl: 1 .  sildenafil (REVATIO) 20 MG tablet, TAKE 3-5 TABLETS BY MOUTH ONCE DAILY AS NEEDED FOR ERECTILE DYSFUNCTION, Disp: 30 tablet, Rfl: 0 .  tacrolimus (PROTOPIC) 0.1 % ointment, Apply 1 application topically 2 (two) times daily., Disp: , Rfl:  .  testosterone cypionate (DEPOTESTOSTERONE CYPIONATE) 200 MG/ML injection, Inject 1 mL (200 mg total) into the muscle every 14 (fourteen) days., Disp: 10 mL, Rfl: 0 .  triamcinolone (KENALOG) 0.1 %, Apply to affected areas 1-2 times a day until rash improved. Avoid face, groin, underarms., Disp: 80 g, Rfl: 1 No current facility-administered medications for this visit.  Facility-Administered Medications Ordered in Other Visits:  .  heparin lock flush 100 unit/mL, 500 Units, Intravenous, Once, Sindy Guadeloupe, MD .  heparin lock flush 100 unit/mL, 500 Units, Intravenous, Once,  Sindy Guadeloupe, MD .  sodium chloride flush (NS) 0.9 % injection 10 mL, 10 mL, Intravenous, PRN, Sindy Guadeloupe, MD    Assessment & Plan:  Acute bronchitis with acute sinusitis :  Will start pt on zpak/ medrol dose pack,. Allergra Has a ho Lymphoma to monitor his temp at home and if he feels his symptoms including wheezing arent better he will need to go to the ER>  Ppt advised to take Tylenol q 4- 6 hourly as needed. pt to take allegra q pm as needed and to call office if symptoms worsened pt verbalised understanding of such.     Problem List Items Addressed This Visit   None    No orders of the defined types were placed in this encounter.    Meds ordered this encounter  Medications  . fexofenadine (ALLEGRA ALLERGY) 180 MG tablet    Sig: Take 1 tablet (180 mg total) by mouth daily.    Dispense:  10 tablet    Refill:  1  . albuterol (VENTOLIN HFA) 108 (90 Base) MCG/ACT inhaler    Sig: Inhale 2 puffs into the lungs every 6 (six) hours as needed for wheezing or shortness of breath.    Dispense:  8 g    Refill:  0  . azithromycin (ZITHROMAX) 250 MG tablet    Sig: Take 2 tablets on day 1, then 1 tablet daily on days 2 through 5    Dispense:  6 tablet    Refill:  0  . methylPREDNISolone (MEDROL DOSEPAK) 4 MG TBPK tablet    Sig: As directed    Dispense:  1 each    Refill:  0     Follow up plan:

## 2021-10-27 NOTE — Progress Notes (Signed)
Patient not able to complete visit at thsi time. He is currently asleep. I've asked our schedulers to reach out to him to reschedule.

## 2021-10-27 NOTE — Progress Notes (Signed)
Subjective:   Russell Simpson is a 60 y.o. male who presents for Medicare Annual/Subsequent preventive examination.  I connected with  [name] on 10/27/21 by a telephone enabled telemedicine application and verified that I am speaking with the correct person using two identifiers.   I discussed the limitations of evaluation and management by telemedicine. The patient expressed understanding and agreed to proceed.  Patient location: home  Provider location:  Tele-Health  not in office    Review of Systems     Cardiac Risk Factors include: advanced age (>76men, >57 women);hypertension;male gender;smoking/ tobacco exposure (going through chemotherapy)     Objective:    Today's Vitals   There is no height or weight on file to calculate BMI.  Advanced Directives 10/27/2021 09/24/2021 07/09/2021 05/14/2021 05/14/2021 05/06/2021 04/08/2021  Does Patient Have a Medical Advance Directive? No Yes Yes No Yes Yes Yes  Type of Advance Directive - [place];Living will Living will;Healthcare Power of [place];Living will [employer];Living will [place];Living will  Does patient want to make changes to medical advance directive? - - - No - Patient declined - No - Patient declined No - Patient declined  Copy of [place] in Chart? - Yes - validated most recent copy scanned in chart (See row information) Yes - validated most recent copy scanned in chart (See row information) - - - Yes - validated most recent copy scanned in chart (See row information)  Would patient like information on creating a medical advance directive? No - Patient declined No - Patient declined - No - Patient declined - - No - Patient declined    Current Medications (verified) Outpatient Encounter Medications as of 10/27/2021  Medication Sig   acetaminophen (TYLENOL) 325 MG tablet Take 2 tablets (650 mg total) by mouth  daily. for 5 days starting 2 days prior to treatment   acyclovir (ZOVIRAX) 400 MG tablet TAKE 1 TABLET BY MOUTH TWICE DAILY   allopurinol (ZYLOPRIM) 300 MG tablet TAKE 1 TABLET BY MOUTH ONCE DAILY   atorvastatin (LIPITOR) 40 MG tablet Take 1 tablet (40 mg total) by mouth daily.   buPROPion (WELLBUTRIN XL) 150 MG 24 hr tablet Take 1 tablet by mouth every morning.   clonazePAM (KLONOPIN) 0.5 MG tablet Take 0.5 mg by mouth daily.   diltiazem (CARDIZEM CD) 120 MG 24 hr capsule Take 1 capsule (120 mg total) by mouth daily.   diphenhydrAMINE (BENADRYL) 25 mg capsule Take 1 capsule (25 mg total) by mouth daily for 1 dose. Take 1 capsule daily for 5 days starting 2 days prior to treatment   diphenoxylate-atropine (LOMOTIL) 2.5-0.025 MG tablet Take 1 tablet by mouth 4 (four) times daily as needed for diarrhea or loose stools.   DULoxetine (CYMBALTA) 60 MG capsule TAKE 1 CAPSULE EVERY DAY   famotidine (PEPCID) 20 MG tablet TAKE 1 TABLET TWICE DAILY   finasteride (PROSCAR) 5 MG tablet TAKE 1 TABLET BY MOUTH ONCE DAILY   lansoprazole (PREVACID) 30 MG capsule TAKE 1 CAPSULE BY MOUTH ONCE DAILY AT NOON   loratadine (CLARITIN) 10 MG tablet Take 10 mg by mouth daily.    metoprolol succinate (TOPROL-XL) 50 MG 24 hr tablet Take 2 tablets (100 mg total) by mouth daily. Take with or immediately following a meal.   mirtazapine (REMERON) 15 MG tablet Take 15 mg by mouth at bedtime.   mupirocin ointment (BACTROBAN) 2 % Apply 1 application topically 2 (two)  times daily.   nicotine (NICODERM CQ) 21 mg/24hr patch Place 1 patch (21 mg total) onto the skin daily.   Nicotine 10 MG/ML SOLN Place 0.1 mLs (1 mg total) into the nose 4 (four) times daily as needed.   OLANZapine (ZYPREXA) 7.5 MG tablet Take 7.5 mg by mouth at bedtime.   sildenafil (REVATIO) 20 MG tablet TAKE 3-5 TABLETS BY MOUTH ONCE DAILY AS NEEDED FOR ERECTILE DYSFUNCTION   tacrolimus (PROTOPIC) 0.1 % ointment Apply 1 application topically 2 (two) times daily.    testosterone cypionate (DEPOTESTOSTERONE CYPIONATE) 200 MG/ML injection Inject 1 mL (200 mg total) into the muscle every 14 (fourteen) days.   triamcinolone (KENALOG) 0.1 % Apply to affected areas 1-2 times a day until rash improved. Avoid face, groin, underarms.   ondansetron (ZOFRAN) 8 MG tablet Take 1 tablet (8 mg total) by mouth 2 (two) times daily as needed for refractory nausea / vomiting. Start on day 2 after bendamustine chemo. 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[place])    Arthritis    Asthma    Basal cell carcinoma 01/28/2021   R upper arm, EDC 03/04/2021   Brain 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   Chronic pain    right arm   Depression    Environmental and seasonal allergies    Follicular lymphoma ([place])    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   left neck, Moh's 05/29/21   Hypertension    Pneumonia    SCC (squamous cell carcinoma) 08/28/2021   upper chest right of midline, EDC done 09/17/2021   Sleep apnea    does not wear CPAP ; uses humidifier instead   Squamous cell carcinoma of skin 02/19/2021   L inferior mandible, treated with EDC   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/28/2021   R ant shoulder - ED&C   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/28/2021   L upper abdomen - ED&C   Past Surgical History:  Procedure Laterality Date   ANTERIOR CERVICAL  DECOMP/DISCECTOMY FUSION N/A 06/17/2016   Procedure: ANTERIOR CERVICAL DECOMPRESSION FUSION, CERVICAL 3-4, CERVICAL 4-5 WITH INSTRUMENTATION AND ALLOGRAFT;  Surgeon: Phylliss Bob, MD;  Location: [facility];  Service: Orthopedics;  Laterality: N/A;  ANTERIOR CERVICAL DECOMPRESSION FUSION, CERVICAL 3-4, CERVICAL 4-5 WITH INSTRUMENTATION AND ALLOGRAFT   BACK SURGERY     NECK SURGERY  2009   PORTA CATH INSERTION N/A 04/03/2021   Procedure: PORTA CATH INSERTION;  Surgeon: Algernon Huxley, MD;  Location: [facility];  Service: Cardiovascular;  Laterality: N/A;   Family History  Problem Relation Age of Onset   Diabetes Mother    Diabetes Other    Social History   Socioeconomic History   Marital status: Divorced    Spouse name: Not on file   Number of children: Not on file   Years of education: Not on file   Highest education level: Not on file  Occupational History  Not on file  Tobacco Use   Smoking status: Every Day    Types: Cigars   Smokeless tobacco: Never   Tobacco comments:    5-6 cigars a day  Vaping Use   Vaping Use: Former   Start date: 04/30/2017   Quit date: 11/30/2017  Substance and Sexual Activity   Alcohol use: Not Currently    Comment: beers 12 a day   Drug use: No   Sexual activity: Not on file  Other Topics Concern   Not on file  Social History Narrative   Not on file   Social Determinants of Health   Financial Resource Strain: High Risk   Difficulty of Paying Living Expenses: Hard  Food Insecurity: Food Insecurity Present   Worried About Charity fundraiser in the Last Year: Often true   Ran Out of Food in the Last Year: Often true  Transportation Needs: No Transportation Needs   Lack of Transportation (Medical): No   Lack of Transportation (Non-Medical): No  Physical Activity: Inactive   Days of Exercise per Week: 0 days   Minutes of Exercise per Session: 0 min  Stress: No Stress Concern Present   Feeling of Stress : Only a little  Social Connections:  Socially Isolated   Frequency of Communication with Friends and Family: More than three times a week   Frequency of Social Gatherings with Friends and Family: More than three times a week   Attends Religious Services: Never   Marine scientist or Organizations: No   Attends Music therapist: Never   Marital Status: Divorced    Tobacco Counseling Ready to quit: Not Answered Counseling given: Not Answered Tobacco comments: 5-6 cigars a day   Clinical Intake:  Pre-visit preparation completed: Yes  Pain : No/denies pain     Diabetes: No  How often do you need to have someone help you when you read instructions, pamphlets, or other written materials from your doctor or pharmacy?: 1 - Never  Diabetic?no  Interpreter Needed?: No  Information entered by :: Leroy Kennedy LPN   Activities of Daily Living In your present state of health, do you have any difficulty performing the following activities: 10/27/2021 05/14/2021  Hearing? N N  Vision? N N  Difficulty concentrating or making decisions? N N  Walking or climbing stairs? N N  Dressing or bathing? N N  Doing errands, shopping? N N  Preparing Food and eating ? N -  Using the Toilet? N -  In the past six months, have you accidently leaked urine? N -  Do you have problems with loss of bowel control? N -  Managing your Medications? N -  Managing your Finances? N -  Housekeeping or managing your Housekeeping? N -  Some recent data might be hidden    Patient Care Team: Charlynne Cousins, MD as PCP - General (Internal Medicine) Kate Sable, MD as PCP - Cardiology (Cardiology) Sindy Guadeloupe, MD as Consulting Physician (Oncology)  Indicate any recent Medical Services you may have received from other than [facility] providers in the past year (date may be approximate).     Assessment:   This is a routine wellness examination for Russell Simpson.  Hearing/Vision screen No results found.  Dietary issues and exercise  activities discussed: Current Exercise Habits: The patient does not participate in regular exercise at present, Intensity: Not Applicable   Goals Addressed             This Visit's Progress  Patient Stated       To get health under control       Depression Screen PHQ 2/9 Scores 10/27/2021 08/25/2021 06/04/2021 01/27/2021 05/23/2020 03/27/2019 02/14/2019  PHQ - 2 Score 6 3 3 4 6 2 5   PHQ- 9 Score 10 9 11 14 19 10 18     Fall Risk Fall Risk  10/27/2021 08/25/2021 06/04/2021 01/27/2021 05/23/2020  Falls in the past year? 0 0 0 1 1  Number falls in past yr: 0 0 0 1 1  Injury with Fall? 0 0 0 1 1  Risk for fall due to : - No Fall Risks No Fall Risks History of fall(s) -  Follow up Falls evaluation completed;Falls prevention discussed Falls evaluation completed Falls evaluation completed Falls evaluation completed;Education provided;Falls prevention discussed -    FALL RISK PREVENTION PERTAINING TO THE HOME:  Any stairs in or around the home? No  If so, are there any without handrails? No  Home free of loose throw rugs in walkways, pet beds, electrical cords, etc? Yes  Adequate lighting in your home to reduce risk of falls? Yes   ASSISTIVE DEVICES UTILIZED TO PREVENT FALLS:  Life alert? No  Use of a cane, walker or w/c? No  Grab bars in the bathroom? No  Shower chair or bench in shower? No  Elevated toilet seat or a handicapped toilet? No   TIMED UP AND GO:  Was the test performed? No .    Cognitive Function:  Normal cognitive status assessed by direct observation by this Nurse Health Advisor. No abnormalities found.          Immunizations Immunization History  Administered Date(s) Administered   Influenza Split 08/05/2010, 09/30/2011   Influenza,inj,Quad PF,6+ Mos 09/25/2015, 08/31/2018, 08/21/2019, 08/30/2019, 09/12/2020   PFIZER Comirnaty(Gray Top)Covid-19 Tri-Sucrose Vaccine 02/27/2021   Tdap 05/29/2009    TDAP status: Due, Education has been provided regarding  the importance of this vaccine. Advised may receive this vaccine at local pharmacy or Health Dept. Aware to provide a copy of the vaccination record if obtained from local pharmacy or Health Dept. Verbalized acceptance and understanding.  Flu Vaccine status: Due, Education has been provided regarding the importance of this vaccine. Advised may receive this vaccine at local pharmacy or Health Dept. Aware to provide a copy of the vaccination record if obtained from local pharmacy or Health Dept. Verbalized acceptance and understanding.    Covid-19 vaccine status: Information provided on how to obtain vaccines.   Qualifies for Shingles Vaccine? Yes   Zostavax completed No   Shingrix Completed?: No.    Education has been provided regarding the importance of this vaccine. Patient has been advised to call insurance company to determine out of pocket expense if they have not yet received this vaccine. Advised may also receive vaccine at local pharmacy or Health Dept. Verbalized acceptance and understanding.  Screening Tests Health Maintenance  Topic Date Due   Zoster Vaccines- Shingrix (1 of 2) Never done   COVID-19 Vaccine (2 - Pfizer risk series) 03/20/2021   INFLUENZA VACCINE  06/30/2021   Pneumococcal Vaccine 46-37 Years old (1 - PCV) 06/04/2022 (Originally 08/04/1967)   TETANUS/TDAP  06/04/2022 (Originally 05/30/2019)   COLONOSCOPY (Pts 45-76yrs Insurance coverage will need to be confirmed)  01/04/2023   Hepatitis C Screening  Completed   HIV Screening  Completed   HPV VACCINES  Aged Out    Health Maintenance  Health Maintenance Due  Topic Date Due   Zoster Vaccines- Shingrix (1 of  2) Never done   COVID-19 Vaccine (2 - Pfizer risk series) 03/20/2021   INFLUENZA VACCINE  06/30/2021    Colorectal cancer screening: Type of screening: Colonoscopy. Completed 2014. Repeat every 10 years  Lung Cancer Screening: (Low Dose CT Chest recommended if Age 45-80 years, 30 pack-year currently smoking  OR have quit w/in 15years.) does not qualify.   Lung Cancer Screening Referral:   Additional Screening:  Hepatitis C Screening: does not qualify; Completed   Vision Screening: Recommended annual ophthalmology exams for early detection of glaucoma and other disorders of the eye. Is the patient up to date with their annual eye exam?  No  Who is the provider or what is the name of the office in which the patient attends annual eye exams? unsure If pt is not established with a provider, would they like to be referred to a provider to establish care? No .   Dental Screening: Recommended annual dental exams for proper oral hygiene  Community Resource Referral / Chronic Care Management: CRR required this visit?  No   CCM required this visit?  No      Plan:     I have personally reviewed and noted the following in the patient's chart:   Medical and social history Use of alcohol, tobacco or illicit drugs  Current medications and supplements including opioid prescriptions. Patient is currently taking opioid prescriptions. Information provided to patient regarding non-opioid alternatives. Patient advised to discuss non-opioid treatment plan with their provider. Functional ability and status Nutritional status Physical activity Advanced directives List of other physicians Hospitalizations, surgeries, and ER visits in previous 12 months Vitals Screenings to include cognitive, depression, and falls Referrals and appointments  In addition, I have reviewed and discussed with patient certain preventive protocols, quality metrics, and best practice recommendations. A written personalized care plan for preventive services as well as general preventive health recommendations were provided to patient.     Leroy Kennedy, LPN   27/01/5008   Nurse Notes: patient is scheduled today at 4:20 for his cough

## 2021-10-27 NOTE — Telephone Encounter (Signed)
Visit noted.

## 2021-10-27 NOTE — Patient Instructions (Signed)
[name] , Thank you for taking time to come for your Medicare Wellness Visit. I appreciate your ongoing commitment to your health goals. Please review the following plan we discussed and let me know if I can assist you in the future.   Screening recommendations/referrals: Colonoscopy: up to date Recommended yearly ophthalmology/optometry visit for glaucoma screening and checkup Recommended yearly dental visit for hygiene and checkup  Vaccinations: Influenza vaccine: Education provided Pneumococcal vaccine: not indicated Tdap vaccine: Education provided Shingles vaccine: Education provided    Advanced directives: Education provided  Conditions/risks identified:   Next appointment: 10-27-2021 @ 4:20 [place]    Preventive Care 40-64 Years, Male Preventive care refers tolifestyle choices and visits with your health care provider that can promote health and wellness. What does preventive care include? A yearly physical exam. This is also called an annual well check. Dental exams once or twice a year. Routine eye exams. Ask your health care provider how often you should have your eyes checked. Personal lifestyle choices, including: Daily care of your teeth and gums. Regular physical activity. Eating a healthy diet. Avoiding tobacco and drug use. Limiting alcohol use. Practicing safe sex. Taking low-dose aspirin every day starting at age 49. What happens during an annual well check? The services and screenings done by your health care provider during your annual well check will depend on your age, overall health, lifestyle risk factors, and family history of disease. Counseling  Your health care provider may ask you questions about your: Alcohol use. Tobacco use. Drug use. Emotional well-being. Home and relationship well-being. Sexual activity. Eating habits. Work and work Statistician. Screening  You may have the following tests or measurements: Height, weight, and  BMI. Blood pressure. Lipid and cholesterol levels. These may be checked every 5 years, or more frequently if you are over 55 years old. Skin check. Lung cancer screening. You may have this screening every year starting at age 51 if you have a 30-pack-year history of smoking and currently smoke or have quit within the past 15 years. Fecal occult blood test (FOBT) of the stool. You may have this test every year starting at age 79. Flexible sigmoidoscopy or colonoscopy. You may have a sigmoidoscopy every 5 years or a colonoscopy every 10 years starting at age 20. Prostate cancer screening. Recommendations will vary depending on your family history and other risks. Hepatitis C blood test. Hepatitis B blood test. Sexually transmitted disease (STD) testing. Diabetes screening. This is done by checking your blood sugar (glucose) after you have not eaten for a while (fasting). You may have this done every 1-3 years. Discuss your test results, treatment options, and if necessary, the need for more tests with your health care provider. Vaccines  Your health care provider may recommend certain vaccines, such as: Influenza vaccine. This is recommended every year. Tetanus, diphtheria, and acellular pertussis (Tdap, Td) vaccine. You may need a Td booster every 10 years. Zoster vaccine. You may need this after age 31. Pneumococcal 13-valent conjugate (PCV13) vaccine. You may need this if you have certain conditions and have not been vaccinated. Pneumococcal polysaccharide (PPSV23) vaccine. You may need one or two doses if you smoke cigarettes or if you have certain conditions. Talk to your health care provider about which screenings and vaccines you need and how often you need them. This information is not intended to replace advice given to you by your health care provider. Make sure you discuss any questions you have with your health care provider. Document Released:  12/13/2015 Document Revised: 08/05/2016  Document Reviewed: 09/17/2015 Elsevier Interactive Patient Education  2017 [place] Prevention in the Home Falls can cause injuries. They can happen to people of all ages. There are many things you can do to make your home safe and to help prevent falls. What can I do on the outside of my home? Regularly fix the edges of walkways and driveways and fix any cracks. Remove anything that might make you trip as you walk through a door, such as a raised step or threshold. Trim any bushes or trees on the path to your home. Use bright outdoor lighting. Clear any walking paths of anything that might make someone trip, such as rocks or tools. Regularly check to see if handrails are loose or broken. Make sure that both sides of any steps have handrails. Any raised decks and porches should have guardrails on the edges. Have any leaves, snow, or ice cleared regularly. Use sand or salt on walking paths during winter. Clean up any spills in your garage right away. This includes oil or grease spills. What can I do in the bathroom? Use night lights. Install grab bars by the toilet and in the tub and shower. Do not use towel bars as grab bars. Use non-skid mats or decals in the tub or shower. If you need to sit down in the shower, use a plastic, non-slip stool. Keep the floor dry. Clean up any water that spills on the floor as soon as it happens. Remove soap buildup in the tub or shower regularly. Attach bath mats securely with double-sided non-slip rug tape. Do not have throw rugs and other things on the floor that can make you trip. What can I do in the bedroom? Use night lights. Make sure that you have a light by your bed that is easy to reach. Do not use any sheets or blankets that are too big for your bed. They should not hang down onto the floor. Have a firm chair that has side arms. You can use this for support while you get dressed. Do not have throw rugs and other things on the floor  that can make you trip. What can I do in the kitchen? Clean up any spills right away. Avoid walking on wet floors. Keep items that you use a lot in easy-to-reach places. If you need to reach something above you, use a strong step stool that has a grab bar. Keep electrical cords out of the way. Do not use floor polish or wax that makes floors slippery. If you must use wax, use non-skid floor wax. Do not have throw rugs and other things on the floor that can make you trip. What can I do with my stairs? Do not leave any items on the stairs. Make sure that there are handrails on both sides of the stairs and use them. Fix handrails that are broken or loose. Make sure that handrails are as long as the stairways. Check any carpeting to make sure that it is firmly attached to the stairs. Fix any carpet that is loose or worn. Avoid having throw rugs at the top or bottom of the stairs. If you do have throw rugs, attach them to the floor with carpet tape. Make sure that you have a light switch at the top of the stairs and the bottom of the stairs. If you do not have them, ask someone to add them for you. What else can I do to help prevent  falls? Wear shoes that: Do not have high heels. Have rubber bottoms. Are comfortable and fit you well. Are closed at the toe. Do not wear sandals. If you use a stepladder: Make sure that it is fully opened. Do not climb a closed stepladder. Make sure that both sides of the stepladder are locked into place. Ask someone to hold it for you, if possible. Clearly mark and make sure that you can see: Any grab bars or handrails. First and last steps. Where the edge of each step is. Use tools that help you move around (mobility aids) if they are needed. These include: Canes. Walkers. Scooters. Crutches. Turn on the lights when you go into a dark area. Replace any light bulbs as soon as they burn out. Set up your furniture so you have a clear path. Avoid moving your  furniture around. If any of your floors are uneven, fix them. If there are any pets around you, be aware of where they are. Review your medicines with your doctor. Some medicines can make you feel dizzy. This can increase your chance of falling. Ask your doctor what other things that you can do to help prevent falls. This information is not intended to replace advice given to you by your health care provider. Make sure you discuss any questions you have with your health care provider. Document Released: 09/12/2009 Document Revised: 04/23/2016 Document Reviewed: 12/21/2014 Elsevier Interactive Patient Education  2017 [employer].

## 2021-10-28 ENCOUNTER — Ambulatory Visit: Payer: Self-pay

## 2021-10-28 ENCOUNTER — Inpatient Hospital Stay: Payer: Medicare HMO | Attending: Nurse Practitioner | Admitting: Nurse Practitioner

## 2021-10-28 ENCOUNTER — Telehealth: Payer: Self-pay | Admitting: Internal Medicine

## 2021-10-28 DIAGNOSIS — Z87891 Personal history of nicotine dependence: Secondary | ICD-10-CM

## 2021-10-28 NOTE — Telephone Encounter (Signed)
Routing to provider as an FYI

## 2021-10-28 NOTE — Telephone Encounter (Signed)
Patient is having a bad cough and would like a prescription for cough medicine called into the pharmacy.   Pt is requesting a cough medicine with codeine as this is the only cough medicine that works for him.   His chart states that he is allergic codeine, but this is not true.  Patent is asking for med to be sent in four cough to, [place], [place].  Phone: [phone]  Fax: [phone]

## 2021-10-28 NOTE — Telephone Encounter (Signed)
Patient called in just to let Dr Neomia Dear know that he doesn't need to come in for the covid test. He took a home test and is negative

## 2021-10-29 ENCOUNTER — Encounter: Payer: Self-pay | Admitting: Oncology

## 2021-10-29 MED ORDER — CHERATUSSIN AC 100-10 MG/5ML PO SOLN: 5.0000 mL | Freq: Every evening | ORAL | 0 refills | Status: DC

## 2021-10-29 NOTE — Telephone Encounter (Signed)
I just saw his 2 days ago will send in meds pl let him knwo thnx

## 2021-10-29 NOTE — Telephone Encounter (Signed)
Patient notified of medications being sent in.

## 2021-10-29 NOTE — Telephone Encounter (Signed)
Just sent in meds

## 2021-10-29 NOTE — Addendum Note (Signed)
Addended by: Charlynne Cousins on: 10/29/2021 10:03 AM   Modules accepted: Orders

## 2021-10-30 ENCOUNTER — Encounter: Payer: Self-pay | Admitting: Internal Medicine

## 2021-10-30 DIAGNOSIS — R0981 Nasal congestion: Secondary | ICD-10-CM | POA: Insufficient documentation

## 2021-11-03 ENCOUNTER — Telehealth (INDEPENDENT_AMBULATORY_CARE_PROVIDER_SITE_OTHER): Payer: Medicare HMO | Admitting: Internal Medicine

## 2021-11-03 ENCOUNTER — Ambulatory Visit: Payer: Self-pay

## 2021-11-03 ENCOUNTER — Encounter: Payer: Self-pay | Admitting: Internal Medicine

## 2021-11-03 DIAGNOSIS — R052 Subacute cough: Secondary | ICD-10-CM

## 2021-11-03 MED ORDER — CHERATUSSIN AC 100-10 MG/5ML PO SOLN: 5.0000 mL | Freq: Every evening | ORAL | 0 refills | Status: DC

## 2021-11-03 MED ORDER — ALBUTEROL SULFATE HFA 108 (90 BASE) MCG/ACT IN AERS: 2.0000 | INHALATION_SPRAY | Freq: Four times a day (QID) | RESPIRATORY_TRACT | 2 refills | Status: DC | PRN

## 2021-11-03 MED ORDER — AZITHROMYCIN 250 MG PO TABS: ORAL_TABLET | ORAL | 0 refills | Status: AC

## 2021-11-03 NOTE — Telephone Encounter (Signed)
Sinus congestion and blocked nose for 2 weeks. Clear nasal drainage. No facial pain. Pt also with fatigue and with cough.  Virtual visit appt for today with PCP. Care advise given and pt verbalized understanding. Transferred pt to MyChart help center. Pt is already active but stated he needed to change password and has been having trouble with MyChart. Pt stated last visit was via telephone.    Reason for Disposition  [1] Sinus congestion (pressure, fullness) AND [2] present > 10 days  Answer Assessment - Initial Assessment Questions 1. LOCATION: "Where does it hurt?"      no 2. ONSET: "When did the sinus pain start?"  (e.g., hours, days)      no 3. SEVERITY: "How bad is the pain?"   (Scale 1-10; mild, moderate or severe)   - MILD (1-3): doesn't interfere with normal activities    - MODERATE (4-7): interferes with normal activities (e.g., work or school) or awakens from sleep   - SEVERE (8-10): excruciating pain and patient unable to do any normal activities        No pain 4. RECURRENT SYMPTOM: "Have you ever had sinus problems before?" If Yes, ask: "When was the last time?" and "What happened that time?"      Yes-last Fall- 5. NASAL CONGESTION: "Is the nose blocked?" If Yes, ask: "Can you open it or must you breathe through your mouth?"     Blocked  6. NASAL DISCHARGE: "Do you have discharge from your nose?" If so ask, "What color?"     clear 7. FEVER: "Do you have a fever?" If Yes, ask: "What is it, how was it measured, and when did it start?"      no 8. OTHER SYMPTOMS: "Do you have any other symptoms?" (e.g., sore throat, cough, earache, difficulty breathing)     Coughing, sore muscles of chest and abdominal 9. PREGNANCY: "Is there any chance you are pregnant?" "When was your last menstrual period?"     N/a  Protocols used: Sinus Pain or Congestion-A-AH

## 2021-11-03 NOTE — Progress Notes (Addendum)
There were no vitals taken for this visit.   Subjective:    Patient ID: Russell Simpson, male    DOB: Jul 29, 1961, 60 y.o.   MRN: [id number]  Chief Complaint  Patient presents with   chest congestions   Nasal Congestion   Cough    Has all for the past week, has been taking OTC antihistamine. Was given Medrol Dose pak and Cherratussin at last visit on 10/27/21 for Bronchitis. Patient states that he has finished these and is still feeling bad    HPI: Russell Simpson is a 60 y.o. male  Cough This is a recurrent (wheezing + cough persists) problem. The problem has been gradually worsening. Associated symptoms include chills and wheezing. Pertinent negatives include no ear congestion, ear pain, fever, myalgias, nasal congestion, postnasal drip, rhinorrhea, sore throat or shortness of breath.   Chief Complaint  Patient presents with   chest congestions   Nasal Congestion   Cough    Has all for the past week, has been taking OTC antihistamine. Was given Medrol Dose pak and Cherratussin at last visit on 10/27/21 for Bronchitis. Patient states that he has finished these and is still feeling bad    Relevant past medical, surgical, family and social history reviewed and updated as indicated. Interim medical history since our last visit reviewed. Allergies and medications reviewed and updated.  Review of Systems  Constitutional:  Positive for chills. Negative for fever.  HENT:  Negative for ear pain, postnasal drip, rhinorrhea and sore throat.   Respiratory:  Positive for cough and wheezing. Negative for shortness of breath.   Musculoskeletal:  Negative for myalgias.   Per HPI unless specifically indicated above     Objective:    There were no vitals taken for this visit.  Wt Readings from Last 3 Encounters:  09/24/21 229 lb (103.9 kg)  09/19/21 224 lb (101.6 kg)  09/09/21 226 lb 11.2 oz (102.8 kg)    Physical Exam Unable to peform sec to virtual visit.   Results for orders placed  or performed in visit on 09/24/21  Comprehensive metabolic panel  Result Value Ref Range   Sodium 134 (L) 135 - 145 mmol/L   Potassium 4.4 3.5 - 5.1 mmol/L   Chloride 99 98 - 111 mmol/L   CO2 24 22 - 32 mmol/L   Glucose, Bld 122 (H) 70 - 99 mg/dL   BUN 8 6 - 20 mg/dL   Creatinine, Ser 0.70 0.61 - 1.24 mg/dL   Calcium 9.1 8.9 - 10.3 mg/dL   Total Protein 6.6 6.5 - 8.1 g/dL   Albumin 4.2 3.5 - 5.0 g/dL   AST 35 15 - 41 U/L   ALT 35 0 - 44 U/L   Alkaline Phosphatase 128 (H) 38 - 126 U/L   Total Bilirubin 1.2 0.3 - 1.2 mg/dL   GFR, Estimated >60 >60 mL/min   Anion gap 11 5 - 15  CBC with Differential  Result Value Ref Range   WBC 3.9 (L) 4.0 - 10.5 K/uL   RBC 4.13 (L) 4.22 - 5.81 MIL/uL   Hemoglobin 14.1 13.0 - 17.0 g/dL   HCT 38.9 (L) 39.0 - 52.0 %   MCV 94.2 80.0 - 100.0 fL   MCH 34.1 (H) 26.0 - 34.0 pg   MCHC 36.2 (H) 30.0 - 36.0 g/dL   RDW 14.5 11.5 - 15.5 %   Platelets 125 (L) 150 - 400 K/uL   nRBC 0.0 0.0 - 0.2 %   Neutrophils Relative %  53 %   Neutro Abs 2.1 1.7 - 7.7 K/uL   Lymphocytes Relative 13 %   Lymphs Abs 0.5 (L) 0.7 - 4.0 K/uL   Monocytes Relative 22 %   Monocytes Absolute 0.8 0.1 - 1.0 K/uL   Eosinophils Relative 3 %   Eosinophils Absolute 0.1 0.0 - 0.5 K/uL   Basophils Relative 2 %   Basophils Absolute 0.1 0.0 - 0.1 K/uL   WBC Morphology MILD LEFT SHIFT (1-5% METAS, OCC MYELO, OCC BANDS)    RBC Morphology MORPHOLOGY UNREMARKABLE    Smear Review Normal platelet morphology    Immature Granulocytes 7 %   Abs Immature Granulocytes 0.27 (H) 0.00 - 0.07 K/uL        Current Outpatient Medications:    acetaminophen (TYLENOL) 325 MG tablet, Take 2 tablets (650 mg total) by mouth daily. for 5 days starting 2 days prior to treatment, Disp: 30 tablet, Rfl: 0   albuterol (VENTOLIN HFA) 108 (90 Base) MCG/ACT inhaler, Inhale 2 puffs into the lungs every 6 (six) hours as needed for wheezing or shortness of breath., Disp: 8 g, Rfl: 0   allopurinol (ZYLOPRIM) 300 MG  tablet, TAKE 1 TABLET BY MOUTH ONCE DAILY, Disp: 30 tablet, Rfl: 3   atorvastatin (LIPITOR) 40 MG tablet, Take 1 tablet (40 mg total) by mouth daily., Disp: 90 tablet, Rfl: 4   buPROPion (WELLBUTRIN XL) 150 MG 24 hr tablet, Take 1 tablet by mouth every morning., Disp: , Rfl:    clonazePAM (KLONOPIN) 0.5 MG tablet, Take 0.5 mg by mouth daily., Disp: , Rfl:    diltiazem (CARDIZEM CD) 120 MG 24 hr capsule, Take 1 capsule (120 mg total) by mouth daily., Disp: 30 capsule, Rfl: 5   diphenhydrAMINE (BENADRYL) 25 mg capsule, Take 1 capsule (25 mg total) by mouth daily for 1 dose. Take 1 capsule daily for 5 days starting 2 days prior to treatment, Disp: 30 capsule, Rfl: 0   diphenoxylate-atropine (LOMOTIL) 2.5-0.025 MG tablet, Take 1 tablet by mouth 4 (four) times daily as needed for diarrhea or loose stools., Disp: 30 tablet, Rfl: 0   DULoxetine (CYMBALTA) 60 MG capsule, TAKE 1 CAPSULE EVERY DAY, Disp: 90 capsule, Rfl: 0   famotidine (PEPCID) 20 MG tablet, TAKE 1 TABLET TWICE DAILY, Disp: 180 tablet, Rfl: 0   fexofenadine (ALLEGRA ALLERGY) 180 MG tablet, Take 1 tablet (180 mg total) by mouth daily., Disp: 10 tablet, Rfl: 1   finasteride (PROSCAR) 5 MG tablet, TAKE 1 TABLET BY MOUTH ONCE DAILY, Disp: 90 tablet, Rfl: 2   lansoprazole (PREVACID) 30 MG capsule, TAKE 1 CAPSULE BY MOUTH ONCE DAILY AT NOON, Disp: 30 capsule, Rfl: 2   mirtazapine (REMERON) 15 MG tablet, Take 15 mg by mouth at bedtime., Disp: , Rfl:    mupirocin ointment (BACTROBAN) 2 %, Apply 1 application topically 2 (two) times daily., Disp: 22 g, Rfl: 0   OLANZapine (ZYPREXA) 7.5 MG tablet, Take 7.5 mg by mouth at bedtime., Disp: , Rfl:    ondansetron (ZOFRAN) 8 MG tablet, Take 1 tablet (8 mg total) by mouth 2 (two) times daily as needed for refractory nausea / vomiting. Start on day 2 after bendamustine chemo., Disp: 30 tablet, Rfl: 1   prochlorperazine (COMPAZINE) 10 MG tablet, TAKE 1 TABLET BY MOUTH EVERY 6 HOURS AS NEEDED NAUSEA AND VOMITING,  Disp: 30 tablet, Rfl: 1   sildenafil (REVATIO) 20 MG tablet, TAKE 3-5 TABLETS BY MOUTH ONCE DAILY AS NEEDED FOR ERECTILE DYSFUNCTION, Disp: 30 tablet, Rfl: 0  tacrolimus (PROTOPIC) 0.1 % ointment, Apply 1 application topically 2 (two) times daily., Disp: , Rfl:    testosterone cypionate (DEPOTESTOSTERONE CYPIONATE) 200 MG/ML injection, Inject 1 mL (200 mg total) into the muscle every 14 (fourteen) days., Disp: 10 mL, Rfl: 0   triamcinolone (KENALOG) 0.1 %, Apply to affected areas 1-2 times a day until rash improved. Avoid face, groin, underarms., Disp: 80 g, Rfl: 1   acyclovir (ZOVIRAX) 400 MG tablet, TAKE 1 TABLET BY MOUTH TWICE DAILY (Patient not taking: Reported on 11/03/2021), Disp: 60 tablet, Rfl: 2   guaiFENesin-codeine (CHERATUSSIN AC) 100-10 MG/5ML syrup, Take 5 mLs by mouth every evening. (Patient not taking: Reported on 11/03/2021), Disp: 120 mL, Rfl: 0   methylPREDNISolone (MEDROL DOSEPAK) 4 MG TBPK tablet, As directed (Patient not taking: Reported on 11/03/2021), Disp: 1 each, Rfl: 0   metoprolol succinate (TOPROL-XL) 50 MG 24 hr tablet, Take 2 tablets (100 mg total) by mouth daily. Take with or immediately following a meal. (Patient not taking: Reported on 11/03/2021), Disp: 90 tablet, Rfl: 3   nicotine (NICODERM CQ) 21 mg/24hr patch, Place 1 patch (21 mg total) onto the skin daily. (Patient not taking: Reported on 11/03/2021), Disp: 28 patch, Rfl: 0   Nicotine 10 MG/ML SOLN, Place 0.1 mLs (1 mg total) into the nose 4 (four) times daily as needed. (Patient not taking: Reported on 11/03/2021), Disp: 10 mL, Rfl: 2 No current facility-administered medications for this visit.  Facility-Administered Medications Ordered in Other Visits:    heparin lock flush 100 unit/mL, 500 Units, Intravenous, Once, Sindy Guadeloupe, MD   heparin lock flush 100 unit/mL, 500 Units, Intravenous, Once, Sindy Guadeloupe, MD   sodium chloride flush (NS) 0.9 % injection 10 mL, 10 mL, Intravenous, PRN, Sindy Guadeloupe, MD     Assessment & Plan:  Persistent cough :  - start zpak, albuterol q 4- 6 hrly  Home COVID test -ve. Symptoms persistent x last week. Check CXR. Advised to call the office or go to the ER if she develops chest pain ,  shortness of breath, fevers Pt verbalized understanding of such.   Problem List Items Addressed This Visit   None    No orders of the defined types were placed in this encounter.    No orders of the defined types were placed in this encounter.    Follow up plan: No follow-ups on file.   This visit was completed via telephone due to the restrictions of the COVID-19 pandemic. All issues as above were discussed and addressed but no physical exam was performed. If it was felt that the patient should be evaluated in the office, they were directed there. The patient verbally consented to this visit. Patient was unable to complete an audio/visual visit due to Technical difficulties", "Lack of internet. Due to the catastrophic nature of the COVID-19 pandemic, this visit was done through audio contact only. Location of the patient: home Location of the provider: work Those involved with this call:  Provider: Charlynne Cousins, MD CMA: Frazier Butt, [place] Desk/Registration: [employer]  Time spent on call:  10 minutes on the phone discussing health concerns. 10 minutes total spent in review of patient's record and preparation of their chart.

## 2021-11-04 ENCOUNTER — Ambulatory Visit
Admission: RE | Admit: 2021-11-04 | Discharge: 2021-11-04 | Disposition: A | Discharge status: Home / Self Care | Payer: Medicare HMO | Source: Home / Self Care | Attending: Internal Medicine | Admitting: Internal Medicine

## 2021-11-04 ENCOUNTER — Other Ambulatory Visit: Payer: Self-pay

## 2021-11-04 ENCOUNTER — Ambulatory Visit
Admission: RE | Admit: 2021-11-04 | Discharge: 2021-11-04 | Disposition: A | Discharge status: Home / Self Care | Payer: Medicare HMO | Source: Ambulatory Visit | Attending: Internal Medicine | Admitting: Internal Medicine

## 2021-11-04 DIAGNOSIS — R052 Subacute cough: Secondary | ICD-10-CM | POA: Insufficient documentation

## 2021-11-04 DIAGNOSIS — R059 Cough, unspecified: Secondary | ICD-10-CM | POA: Diagnosis not present

## 2021-11-05 ENCOUNTER — Telehealth: Payer: Self-pay | Admitting: Internal Medicine

## 2021-11-05 NOTE — Telephone Encounter (Signed)
Copied from [facility] [phone] [phone]. Topic: General - Other >> Nov 05, 2021  8:14 AM Valere Dross wrote: Reason for CRM: Pt called in wanting to get his imaging results, pt stated he couldn't sign in to mychart to see them, and requested if someone could call and tell them to him, please advise.

## 2021-11-05 NOTE — Telephone Encounter (Signed)
Called and read result note to patient that is in chart.

## 2021-11-06 ENCOUNTER — Other Ambulatory Visit: Payer: Self-pay | Admitting: Family Medicine

## 2021-11-06 DIAGNOSIS — F4310 Post-traumatic stress disorder, unspecified: Secondary | ICD-10-CM

## 2021-11-06 DIAGNOSIS — F410 Panic disorder [episodic paroxysmal anxiety] without agoraphobia: Secondary | ICD-10-CM

## 2021-11-06 DIAGNOSIS — F3160 Bipolar disorder, current episode mixed, unspecified: Secondary | ICD-10-CM

## 2021-11-10 DIAGNOSIS — R052 Subacute cough: Secondary | ICD-10-CM | POA: Insufficient documentation

## 2021-11-14 ENCOUNTER — Telehealth: Payer: Self-pay | Admitting: *Deleted

## 2021-11-14 NOTE — Telephone Encounter (Signed)
° °  Telephone encounter was:  Unsuccessful.  11/14/2021 Name: Russell Simpson MRN: [id number] DOB: [birth year]  Unsuccessful outbound call made today to assist with:  Food Insecurity  Outreach Attempt:  1st Attempt  A HIPAA compliant voice message was left requesting a return call.  Instructed patient to call back at   Instructed patient to call back at [phone]  at their earliest convenience. . [facility], Care Management  [phone] [address] , [place] [address] Email : [email or web address]

## 2021-11-19 ENCOUNTER — Telehealth: Payer: Self-pay | Admitting: *Deleted

## 2021-11-19 NOTE — Telephone Encounter (Signed)
° °  Telephone encounter was:  Successful.  11/19/2021 Name: Mayson Mcneish MRN: [id number] DOB: 11-09-1961  Calyn Rubi is a 60 y.o. year old male who is a primary care patient of Vigg, Avanti, MD . The community resource team was consulted for assistance with [place] guide performed the following interventions: Patient provided with information about care guide support team and interviewed to confirm resource needs.  Follow Up Plan:  Care guide will follow up with patient by phone over the next day  [facility], Care Management  [phone] [address] , [place] [address] Email : [email or web address]

## 2021-11-19 NOTE — Telephone Encounter (Signed)
° °  Telephone encounter was:  Unsuccessful.  11/19/2021 Name: Russell Simpson MRN: [id number] DOB: 1960/12/06  Unsuccessful outbound call made today to assist with:   housing   Outreach Attempt:  2nd Attempt  A HIPAA compliant voice message was left requesting a return call.  Instructed patient to call back at   Instructed patient to call back at [phone]  at their earliest convenience. .  [facility], Care Management  [phone] [address] , [place] [address] Email : [email or web address]

## 2021-11-30 DIAGNOSIS — S7292XA Unspecified fracture of left femur, initial encounter for closed fracture: Secondary | ICD-10-CM

## 2021-11-30 HISTORY — DX: Unspecified fracture of left femur, initial encounter for closed fracture: S72.92XA

## 2021-12-05 ENCOUNTER — Other Ambulatory Visit: Payer: Self-pay

## 2021-12-05 ENCOUNTER — Ambulatory Visit (INDEPENDENT_AMBULATORY_CARE_PROVIDER_SITE_OTHER): Payer: Medicare HMO | Admitting: Nurse Practitioner

## 2021-12-05 ENCOUNTER — Encounter: Payer: Self-pay | Admitting: Nurse Practitioner

## 2021-12-05 VITALS — BP 128/84 | HR 108 | Temp 98.0°F | Wt 234.0 lb

## 2021-12-05 DIAGNOSIS — R051 Acute cough: Secondary | ICD-10-CM | POA: Diagnosis not present

## 2021-12-05 DIAGNOSIS — J069 Acute upper respiratory infection, unspecified: Secondary | ICD-10-CM

## 2021-12-05 LAB — VERITOR FLU A/B WAIVED
Influenza A: NEGATIVE
Influenza B: NEGATIVE

## 2021-12-05 MED ORDER — METHYLPREDNISOLONE 4 MG PO TBPK: ORAL_TABLET | ORAL | 0 refills | Status: DC

## 2021-12-05 MED ORDER — AZITHROMYCIN 250 MG PO TABS: ORAL_TABLET | ORAL | 0 refills | Status: AC

## 2021-12-05 MED ORDER — CHERATUSSIN AC 100-10 MG/5ML PO SOLN: 5.0000 mL | Freq: Every evening | ORAL | 0 refills | Status: DC

## 2021-12-05 NOTE — Progress Notes (Signed)
Results discussed with patient during visit.

## 2021-12-05 NOTE — Progress Notes (Signed)
BP 128/84    Pulse (!) 108    Temp 98 F (36.7 C) (Oral)    Wt 234 lb (106.1 kg)    SpO2 96%    BMI 30.04 kg/m    Subjective:    Patient ID: Russell Simpson, male    DOB: 1961-01-25, 61 y.o.   MRN: [id number]  HPI: Russell Simpson is a 61 y.o. male  Chief Complaint  Patient presents with   Cough    Pt states he has been having congestion, cough, chills, and sinus pressure that started on Monday. States he has been taking Mucinex and Nyquil, little to no relief with OTC medications   UPPER RESPIRATORY TRACT INFECTION Worst symptom: COUGH Fever: no Cough: yes Shortness of breath: yes Wheezing: yes Chest pain: no Chest tightness: no Chest congestion: yes Nasal congestion: yes Runny nose: yes Post nasal drip: yes Sneezing: yes Sore throat: yes Swollen glands: no Sinus pressure: yes Headache: yes Face pain: no Toothache: no Ear pain: no bilateral Ear pressure: yes bilateral Eyes red/itching:no Eye drainage/crusting: no  Vomiting: yes Rash: no Fatigue: yes Sick contacts: yes Strep contacts: no  Context: stable Recurrent sinusitis: no Relief with OTC cold/cough medications: no  Treatments attempted: mucinex and cough syrup     Relevant past medical, surgical, family and social history reviewed and updated as indicated. Interim medical history since our last visit reviewed. Allergies and medications reviewed and updated.  Review of Systems  Constitutional:  Positive for fatigue. Negative for fever.  HENT:  Positive for congestion, postnasal drip, rhinorrhea, sinus pressure, sinus pain and sneezing. Negative for ear pain and sore throat.   Respiratory:  Positive for cough, chest tightness, shortness of breath and wheezing.   Gastrointestinal:  Positive for vomiting.  Skin:  Negative for rash.  Neurological:  Positive for headaches.   Per HPI unless specifically indicated above     Objective:    BP 128/84    Pulse (!) 108    Temp 98 F (36.7 C) (Oral)    Wt 234  lb (106.1 kg)    SpO2 96%    BMI 30.04 kg/m   Wt Readings from Last 3 Encounters:  12/05/21 234 lb (106.1 kg)  09/24/21 229 lb (103.9 kg)  09/19/21 224 lb (101.6 kg)    Physical Exam Vitals and nursing note reviewed.  Constitutional:      General: He is not in acute distress.    Appearance: Normal appearance. He is not ill-appearing, toxic-appearing or diaphoretic.  HENT:     Head: Normocephalic.     Right Ear: Tympanic membrane and external ear normal.     Left Ear: Tympanic membrane and external ear normal.     Nose: Nose normal. No congestion or rhinorrhea.     Mouth/Throat:     Mouth: Mucous membranes are moist.     Pharynx: Posterior oropharyngeal erythema present. No oropharyngeal exudate.  Eyes:     General:        Right eye: No discharge.        Left eye: No discharge.     Extraocular Movements: Extraocular movements intact.     Conjunctiva/sclera: Conjunctivae normal.     Pupils: Pupils are equal, round, and reactive to light.  Cardiovascular:     Rate and Rhythm: Normal rate and regular rhythm.     Heart sounds: No murmur heard. Pulmonary:     Effort: Pulmonary effort is normal. No respiratory distress.     Breath sounds: Wheezing present.  No rhonchi or rales.  Abdominal:     General: Abdomen is flat. Bowel sounds are normal.  Musculoskeletal:     Cervical back: Normal range of motion and neck supple.  Skin:    General: Skin is warm and dry.     Capillary Refill: Capillary refill takes less than 2 seconds.  Neurological:     General: No focal deficit present.     Mental Status: He is alert and oriented to person, place, and time.  Psychiatric:        Mood and Affect: Mood normal.        Behavior: Behavior normal.        Thought Content: Thought content normal.        Judgment: Judgment normal.    Results for orders placed or performed in visit on 09/24/21  Comprehensive metabolic panel  Result Value Ref Range   Sodium 134 (L) 135 - 145 mmol/L    Potassium 4.4 3.5 - 5.1 mmol/L   Chloride 99 98 - 111 mmol/L   CO2 24 22 - 32 mmol/L   Glucose, Bld 122 (H) 70 - 99 mg/dL   BUN 8 6 - 20 mg/dL   Creatinine, Ser 0.70 0.61 - 1.24 mg/dL   Calcium 9.1 8.9 - 10.3 mg/dL   Total Protein 6.6 6.5 - 8.1 g/dL   Albumin 4.2 3.5 - 5.0 g/dL   AST 35 15 - 41 U/L   ALT 35 0 - 44 U/L   Alkaline Phosphatase 128 (H) 38 - 126 U/L   Total Bilirubin 1.2 0.3 - 1.2 mg/dL   GFR, Estimated >60 >60 mL/min   Anion gap 11 5 - 15  CBC with Differential  Result Value Ref Range   WBC 3.9 (L) 4.0 - 10.5 K/uL   RBC 4.13 (L) 4.22 - 5.81 MIL/uL   Hemoglobin 14.1 13.0 - 17.0 g/dL   HCT 38.9 (L) 39.0 - 52.0 %   MCV 94.2 80.0 - 100.0 fL   MCH 34.1 (H) 26.0 - 34.0 pg   MCHC 36.2 (H) 30.0 - 36.0 g/dL   RDW 14.5 11.5 - 15.5 %   Platelets 125 (L) 150 - 400 K/uL   nRBC 0.0 0.0 - 0.2 %   Neutrophils Relative % 53 %   Neutro Abs 2.1 1.7 - 7.7 K/uL   Lymphocytes Relative 13 %   Lymphs Abs 0.5 (L) 0.7 - 4.0 K/uL   Monocytes Relative 22 %   Monocytes Absolute 0.8 0.1 - 1.0 K/uL   Eosinophils Relative 3 %   Eosinophils Absolute 0.1 0.0 - 0.5 K/uL   Basophils Relative 2 %   Basophils Absolute 0.1 0.0 - 0.1 K/uL   WBC Morphology MILD LEFT SHIFT (1-5% METAS, OCC MYELO, OCC BANDS)    RBC Morphology MORPHOLOGY UNREMARKABLE    Smear Review Normal platelet morphology    Immature Granulocytes 7 %   Abs Immature Granulocytes 0.27 (H) 0.00 - 0.07 K/uL      Assessment & Plan:   Problem List Items Addressed This Visit   None Visit Diagnoses     Viral upper respiratory tract infection    -  Primary   Wheezing on exam. Complete course of prednisone and azithromycin. Cough medication sent for symptom managment. Stay hydrated. FU if symptoms do not improve.   Relevant Medications   azithromycin (ZITHROMAX) 250 MG tablet   Acute cough       Continue with albuterol PRN for wheezing. Sent cough medicine with codeine to  help with symptoms. Discussed how to properly use  medication.   Relevant Orders   Veritor Flu A/B Waived   Novel Coronavirus, NAA ([facility])        Follow up plan: Return if symptoms worsen or fail to improve.

## 2021-12-06 LAB — NOVEL CORONAVIRUS, NAA: SARS-CoV-2, NAA: NOT DETECTED

## 2021-12-06 LAB — SARS-COV-2, NAA 2 DAY TAT

## 2021-12-08 NOTE — Progress Notes (Signed)
Hi Russell Simpson.  Your COVID test was negative.

## 2021-12-11 ENCOUNTER — Telehealth (INDEPENDENT_AMBULATORY_CARE_PROVIDER_SITE_OTHER): Payer: Medicare HMO | Admitting: Internal Medicine

## 2021-12-11 ENCOUNTER — Ambulatory Visit: Payer: Self-pay | Admitting: *Deleted

## 2021-12-11 ENCOUNTER — Encounter: Payer: Self-pay | Admitting: Internal Medicine

## 2021-12-11 DIAGNOSIS — J069 Acute upper respiratory infection, unspecified: Secondary | ICD-10-CM | POA: Diagnosis not present

## 2021-12-11 MED ORDER — CHERATUSSIN AC 100-10 MG/5ML PO SOLN: 5.0000 mL | Freq: Every evening | ORAL | 0 refills | Status: DC

## 2021-12-11 MED ORDER — SPIRIVA RESPIMAT 2.5 MCG/ACT IN AERS: 2.0000 | INHALATION_SPRAY | Freq: Every day | RESPIRATORY_TRACT | 1 refills | Status: DC

## 2021-12-11 MED ORDER — ALBUTEROL SULFATE HFA 108 (90 BASE) MCG/ACT IN AERS: 2.0000 | INHALATION_SPRAY | Freq: Four times a day (QID) | RESPIRATORY_TRACT | 2 refills | Status: DC | PRN

## 2021-12-11 NOTE — Progress Notes (Signed)
There were no vitals taken for this visit.   Subjective:    Patient ID: Russell Simpson, male    DOB: [birth year], 61 y.o.   MRN: [id number]  Chief Complaint  Patient presents with   Cough   Nasal Congestion    Runny nose All started on Sunday     HPI: Russell Simpson is a 61 y.o. male   This visit was completed via telephone due to the restrictions of the COVID-19 pandemic. All issues as above were discussed and addressed but no physical exam was performed. If it was felt that the patient should be evaluated in the office, they were directed there. The patient verbally consented to this visit. Patient was unable to complete an audio/visual visit due to Technical difficulties. Due to the catastrophic nature of the COVID-19 pandemic, this visit was done through audio contact only. Location of the patient: home Location of the provider: work Those involved with this call:  Provider: Charlynne Cousins, MD CMA: Frazier Butt, [place] Desk/Registration: [employer]  Time spent on call: 10 minutes on the phone discussing health concerns. 10 minutes total spent in review of patient's record and preparation of their chart. Dad passed away from a massive heart attack - on tuesdays.     Cough This is a new (still coughing congestion in head,) problem. The current episode started in the past 7 days. Associated symptoms include nasal congestion, postnasal drip, shortness of breath and wheezing. Pertinent negatives include no chest pain, chills, ear congestion, ear pain, fever, headaches, heartburn, myalgias, rhinorrhea, sore throat, sweats or weight loss.   Chief Complaint  Patient presents with   Cough   Nasal Congestion    Runny nose All started on Sunday     Relevant past medical, surgical, family and social history reviewed and updated as indicated. Interim medical history since our last visit reviewed. Allergies and medications reviewed and updated.  Review of Systems  Constitutional:   Negative for chills, fever and weight loss.  HENT:  Positive for postnasal drip. Negative for ear pain, rhinorrhea and sore throat.   Respiratory:  Positive for cough, shortness of breath and wheezing.   Cardiovascular:  Negative for chest pain.  Gastrointestinal:  Negative for heartburn.  Musculoskeletal:  Negative for myalgias.  Neurological:  Negative for headaches.   Per HPI unless specifically indicated above     Objective:    There were no vitals taken for this visit.  Wt Readings from Last 3 Encounters:  12/05/21 234 lb (106.1 kg)  09/24/21 229 lb (103.9 kg)  09/19/21 224 lb (101.6 kg)    Physical Exam  Unable to peform sec to virtual visit.   Results for orders placed or performed in visit on 12/05/21  Novel Coronavirus, NAA ([facility])   Specimen: Nasopharyngeal(NP) swabs in vial transport medium  Result Value Ref Range   SARS-CoV-2, NAA Not Detected Not Detected  SARS-COV-2, NAA 2 DAY TAT  Result Value Ref Range   SARS-CoV-2, NAA 2 DAY TAT Performed   Veritor Flu A/B Waived  Result Value Ref Range   Influenza A Negative Negative   Influenza B Negative Negative        Current Outpatient Medications:    acetaminophen (TYLENOL) 325 MG tablet, Take 2 tablets (650 mg total) by mouth daily. for 5 days starting 2 days prior to treatment, Disp: 30 tablet, Rfl: 0   allopurinol (ZYLOPRIM) 300 MG tablet, TAKE 1 TABLET BY MOUTH ONCE DAILY, Disp: 30 tablet, Rfl: 3  atorvastatin (LIPITOR) 40 MG tablet, Take 1 tablet (40 mg total) by mouth daily., Disp: 90 tablet, Rfl: 4   buPROPion (WELLBUTRIN XL) 150 MG 24 hr tablet, Take 1 tablet by mouth every morning., Disp: , Rfl:    clonazePAM (KLONOPIN) 0.5 MG tablet, Take 0.5 mg by mouth daily., Disp: , Rfl:    diltiazem (CARDIZEM CD) 120 MG 24 hr capsule, Take 1 capsule (120 mg total) by mouth daily., Disp: 30 capsule, Rfl: 5   DULoxetine (CYMBALTA) 60 MG capsule, TAKE 1 CAPSULE EVERY DAY, Disp: 90 capsule, Rfl: 0   famotidine  (PEPCID) 20 MG tablet, TAKE 1 TABLET TWICE DAILY, Disp: 180 tablet, Rfl: 0   fexofenadine (ALLEGRA ALLERGY) 180 MG tablet, Take 1 tablet (180 mg total) by mouth daily., Disp: 10 tablet, Rfl: 1   finasteride (PROSCAR) 5 MG tablet, TAKE 1 TABLET BY MOUTH ONCE DAILY, Disp: 90 tablet, Rfl: 2   guaiFENesin-codeine (CHERATUSSIN AC) 100-10 MG/5ML syrup, Take 5 mLs by mouth every evening., Disp: 120 mL, Rfl: 0   lansoprazole (PREVACID) 30 MG capsule, TAKE 1 CAPSULE BY MOUTH ONCE DAILY AT NOON, Disp: 30 capsule, Rfl: 2   methylPREDNISolone (MEDROL DOSEPAK) 4 MG TBPK tablet, Take as directed, Disp: 21 tablet, Rfl: 0   mirtazapine (REMERON) 15 MG tablet, Take 15 mg by mouth at bedtime., Disp: , Rfl:    mupirocin ointment (BACTROBAN) 2 %, Apply 1 application topically 2 (two) times daily., Disp: 22 g, Rfl: 0   sildenafil (REVATIO) 20 MG tablet, TAKE 3-5 TABLETS BY MOUTH ONCE DAILY AS NEEDED FOR ERECTILE DYSFUNCTION, Disp: 30 tablet, Rfl: 0   tacrolimus (PROTOPIC) 0.1 % ointment, Apply 1 application topically 2 (two) times daily., Disp: , Rfl:    testosterone cypionate (DEPOTESTOSTERONE CYPIONATE) 200 MG/ML injection, Inject 1 mL (200 mg total) into the muscle every 14 (fourteen) days., Disp: 10 mL, Rfl: 0   triamcinolone (KENALOG) 0.1 %, Apply to affected areas 1-2 times a day until rash improved. Avoid face, groin, underarms., Disp: 80 g, Rfl: 1   albuterol (VENTOLIN HFA) 108 (90 Base) MCG/ACT inhaler, Inhale 2 puffs into the lungs every 6 (six) hours as needed for wheezing or shortness of breath. (Patient not taking: Reported on 12/11/2021), Disp: 8 g, Rfl: 0   albuterol (VENTOLIN HFA) 108 (90 Base) MCG/ACT inhaler, Inhale 2 puffs into the lungs every 6 (six) hours as needed for wheezing or shortness of breath. (Patient not taking: Reported on 12/11/2021), Disp: 8 g, Rfl: 2   diphenhydrAMINE (BENADRYL) 25 mg capsule, Take 1 capsule (25 mg total) by mouth daily for 1 dose. Take 1 capsule daily for 5 days starting 2  days prior to treatment (Patient not taking: Reported on 12/11/2021), Disp: 30 capsule, Rfl: 0   diphenoxylate-atropine (LOMOTIL) 2.5-0.025 MG tablet, Take 1 tablet by mouth 4 (four) times daily as needed for diarrhea or loose stools. (Patient not taking: Reported on 12/11/2021), Disp: 30 tablet, Rfl: 0   metoprolol succinate (TOPROL-XL) 50 MG 24 hr tablet, Take 2 tablets (100 mg total) by mouth daily. Take with or immediately following a meal. (Patient not taking: Reported on 11/03/2021), Disp: 90 tablet, Rfl: 3   OLANZapine (ZYPREXA) 7.5 MG tablet, Take 7.5 mg by mouth at bedtime. (Patient not taking: Reported on 12/11/2021), Disp: , Rfl:    ondansetron (ZOFRAN) 8 MG tablet, Take 1 tablet (8 mg total) by mouth 2 (two) times daily as needed for refractory nausea / vomiting. Start on day 2 after bendamustine chemo. (Patient  not taking: Reported on 12/11/2021), Disp: 30 tablet, Rfl: 1   prochlorperazine (COMPAZINE) 10 MG tablet, TAKE 1 TABLET BY MOUTH EVERY 6 HOURS AS NEEDED NAUSEA AND VOMITING (Patient not taking: Reported on 12/11/2021), Disp: 30 tablet, Rfl: 1 No current facility-administered medications for this visit.  Facility-Administered Medications Ordered in Other Visits:    heparin lock flush 100 unit/mL, 500 Units, Intravenous, Once, Sindy Guadeloupe, MD   heparin lock flush 100 unit/mL, 500 Units, Intravenous, Once, Sindy Guadeloupe, MD   sodium chloride flush (NS) 0.9 % injection 10 mL, 10 mL, Intravenous, PRN, Sindy Guadeloupe, MD    Assessment & Plan:    URI: Flu and strep ordered at this visit, both negative. pt advised to take Tylenol q 4- 6 hourly as needed. pt to take allegra q pm as needed and to call office if symptoms worsened pt verbalised understanding of such.    Will start pt on spiriva and continue Albuterol for such  Check CXR STAT  To use cheratussin prn q nightly  S/p zpak x 1 week ago  No abx unless CXR shows PNA Pt verbalized understanding of such  Advised to call the  office or go to the ER if she develops chest pain , any shortness of breath  Pt verbalized understanding of such.

## 2021-12-11 NOTE — Telephone Encounter (Signed)
Seen in office on 12/05/21 with URI and treated with azithromycin and prednisone. Completed abx yesterday and to complete prednisone today. Stated cough syrup given for nights has not helped.  Chief Complaint: coughing spells Symptoms: coughing worse at night, SOB with activity, wheezing Frequency:  Pertinent Negatives: Patient denies fever Disposition: [] ED /[] Urgent Care (no appt availability in office) / [x] Appointment(In office/virtual)/ []  [facility]/ [] Home Care/ [] Refused Recommended Disposition /[] [facility]/ []  Follow-up with PCP Additional Notes: called requesting another antibiotic-scheduled follow-up visit from 12/05/21.    Reason for Disposition  SEVERE coughing spells (e.g., whooping sound after coughing, vomiting after coughing)  Answer Assessment - Initial Assessment Questions 1. ONSET: "When did the cough begin?"      Coughing spells and keeping up at night 2. SEVERITY: "How bad is the cough today?"      Coughing already this morning 3. SPUTUM: "Describe the color of your sputum" (none, dry cough; clear, white, yellow, green)     productive 4. HEMOPTYSIS: "Are you coughing up any blood?" If so ask: "How much?" (flecks, streaks, tablespoons, etc.)     none 5. DIFFICULTY BREATHING: "Are you having difficulty breathing?" If Yes, ask: "How bad is it?" (e.g., mild, moderate, severe)    - MILD: No SOB at rest, mild SOB with walking, speaks normally in sentences, can lie down, no retractions, pulse < 100.    - MODERATE: SOB at rest, SOB with minimal exertion and prefers to sit, cannot lie down flat, speaks in phrases, mild retractions, audible wheezing, pulse 100-120.    - SEVERE: Very SOB at rest, speaks in single words, struggling to breathe, sitting hunched forward, retractions, pulse > 120      Moderate with activity 6. FEVER: "Do you have a fever?" If Yes, ask: "What is your temperature, how was it measured, and when did it start?"     None reported 7.  CARDIAC HISTORY: "Do you have any history of heart disease?" (e.g., heart attack, congestive heart failure)       8. LUNG HISTORY: "Do you have any history of lung disease?"  (e.g., pulmonary embolus, asthma, emphysema)      9. PE RISK FACTORS: "Do you have a history of blood clots?" (or: recent major surgery, recent prolonged travel, bedridden)      10. OTHER SYMPTOMS: "Do you have any other symptoms?" (e.g., runny nose, wheezing, chest pain)       Wheezing with activity 11. PREGNANCY: "Is there any chance you are pregnant?" "When was your last menstrual period?"       na 12. TRAVEL: "Have you traveled out of the country in the last month?" (e.g., travel history, exposures)       na  Protocols used: Cough - Acute Productive-A-AH

## 2021-12-12 ENCOUNTER — Ambulatory Visit
Admission: RE | Admit: 2021-12-12 | Discharge: 2021-12-12 | Disposition: A | Discharge status: Home / Self Care | Payer: Medicare HMO | Attending: Internal Medicine | Admitting: Internal Medicine

## 2021-12-12 ENCOUNTER — Ambulatory Visit
Admission: RE | Admit: 2021-12-12 | Discharge: 2021-12-12 | Disposition: A | Discharge status: Home / Self Care | Payer: Medicare HMO | Source: Ambulatory Visit | Attending: Internal Medicine | Admitting: Internal Medicine

## 2021-12-12 ENCOUNTER — Other Ambulatory Visit: Payer: Self-pay

## 2021-12-12 DIAGNOSIS — J069 Acute upper respiratory infection, unspecified: Secondary | ICD-10-CM

## 2021-12-12 DIAGNOSIS — F1010 Alcohol abuse, uncomplicated: Secondary | ICD-10-CM | POA: Diagnosis not present

## 2021-12-12 DIAGNOSIS — F410 Panic disorder [episodic paroxysmal anxiety] without agoraphobia: Secondary | ICD-10-CM | POA: Diagnosis not present

## 2021-12-12 DIAGNOSIS — R059 Cough, unspecified: Secondary | ICD-10-CM | POA: Diagnosis not present

## 2021-12-12 DIAGNOSIS — F3162 Bipolar disorder, current episode mixed, moderate: Secondary | ICD-10-CM | POA: Diagnosis not present

## 2021-12-12 LAB — VERITOR FLU A/B WAIVED
Influenza A: NEGATIVE
Influenza B: NEGATIVE

## 2021-12-12 NOTE — Progress Notes (Signed)
Please let pt know this was normal, no change in mx.  needs COVID / FLU swab today pl let him know to come get these done thnx.

## 2021-12-12 NOTE — Addendum Note (Signed)
Addended by: Donzetta Kohut A on: 12/12/2021 03:58 PM   Modules accepted: Orders

## 2021-12-13 LAB — SARS-COV-2, NAA 2 DAY TAT

## 2021-12-13 LAB — NOVEL CORONAVIRUS, NAA: SARS-CoV-2, NAA: NOT DETECTED

## 2021-12-16 ENCOUNTER — Other Ambulatory Visit: Payer: Self-pay | Admitting: Oncology

## 2021-12-16 ENCOUNTER — Other Ambulatory Visit: Payer: Self-pay | Admitting: Urology

## 2021-12-16 NOTE — Telephone Encounter (Signed)
Pt said [name] sent request for Testosterone last week and haven't heard back from [place] yet.  Pt was following up and would like a return call.

## 2021-12-19 MED ORDER — TESTOSTERONE CYPIONATE 200 MG/ML IM SOLN: 200.0000 mg | INTRAMUSCULAR | 0 refills | Status: DC

## 2021-12-24 ENCOUNTER — Telehealth: Payer: Self-pay | Admitting: Internal Medicine

## 2021-12-24 ENCOUNTER — Other Ambulatory Visit: Payer: Self-pay

## 2021-12-24 ENCOUNTER — Other Ambulatory Visit: Payer: Self-pay | Admitting: Oncology

## 2021-12-24 NOTE — Telephone Encounter (Signed)
Pt called in for assistance. Pt says that he takes Singular 10MG , pt say that it was last prescribed by his previous provider Dr. Caryn Section. Pt would like to have Rx sent to his pharmacy    Pharmacy:  [facility] [place]. Phone:  [phone]  Fax:  [phone]      Last ov: 12/11/20  Future ov:  12/30/21

## 2021-12-24 NOTE — Telephone Encounter (Signed)
Patient requesting a refill of Singular 10mg .   Patient last seen by video on 12/11/21 for URI  Up coming appt on 12/30/21

## 2021-12-24 NOTE — Telephone Encounter (Signed)
Refill request has been sent to PCP for approval

## 2021-12-25 ENCOUNTER — Encounter: Payer: Self-pay | Admitting: Dermatology

## 2021-12-25 ENCOUNTER — Ambulatory Visit: Payer: Medicare HMO | Admitting: Internal Medicine

## 2021-12-25 ENCOUNTER — Ambulatory Visit: Payer: Medicare HMO | Admitting: Dermatology

## 2021-12-25 ENCOUNTER — Other Ambulatory Visit: Payer: Self-pay

## 2021-12-25 DIAGNOSIS — L570 Actinic keratosis: Secondary | ICD-10-CM | POA: Diagnosis not present

## 2021-12-25 DIAGNOSIS — L578 Other skin changes due to chronic exposure to nonionizing radiation: Secondary | ICD-10-CM

## 2021-12-25 NOTE — Progress Notes (Signed)
Follow-Up Visit   Subjective  Russell Simpson is a 61 y.o. male who presents for the following: Actinic Keratosis (Patient with some new spots at face and chest).   The following portions of the chart were reviewed this encounter and updated as appropriate:   Tobacco   Allergies   Meds   Problems   Med Hx   Surg Hx   Fam Hx       Review of Systems:  No other skin or systemic complaints except as noted in HPI or Assessment and Plan.  Objective  Well appearing patient in no apparent distress; mood and affect are within normal limits.  A focused examination was performed including chest, face. Relevant physical exam findings are noted in the Assessment and Plan.  left chest, left forehead Erythematous thin papules/macules with thicker scale.     Assessment & Plan  AK (actinic keratosis) (2) left forehead; left chest  Hypertrophic AK  Actinic keratoses are precancerous spots that appear secondary to cumulative UV radiation exposure/sun exposure over time. They are chronic with expected duration over 1 year. A portion of actinic keratoses will progress to squamous cell carcinoma of the skin. It is not possible to reliably predict which spots will progress to skin cancer and so treatment is recommended to prevent development of skin cancer.  Recommend daily broad spectrum sunscreen SPF 30+ to sun-exposed areas, reapply every 2 hours as needed.  Recommend staying in the shade or wearing long sleeves, sun glasses (UVA+UVB protection) and wide brim hats (4-inch brim around the entire circumference of the hat). Call for new or changing lesions.  Prior to procedure, discussed risks of blister formation, small wound, skin dyspigmentation, or rare scar following cryotherapy. Recommend Vaseline ointment to treated areas while healing.    Destruction of lesion - left chest, left forehead  Destruction method: cryotherapy   Informed consent: discussed and consent obtained   Lesion  destroyed using liquid nitrogen: Yes   Cryotherapy cycles:  2 Outcome: patient tolerated procedure well with no complications   Post-procedure details: wound care instructions given     Actinic Damage - Severe, confluent actinic changes with pre-cancerous actinic keratoses  - Severe, chronic, not at goal, secondary to cumulative UV radiation exposure over time - diffuse scaly erythematous macules and papules with underlying dyspigmentation - Discussed Prescription "Field Treatment" for Severe, Chronic Confluent Actinic Changes with Pre-Cancerous Actinic Keratoses Field treatment involves treatment of an entire area of skin that has confluent Actinic Changes (Sun/ Ultraviolet light damage) and PreCancerous Actinic Keratoses by method of PhotoDynamic Therapy (PDT) and/or prescription Topical Chemotherapy agents such as 5-fluorouracil, 5-fluorouracil/calcipotriene, and/or imiquimod.  The purpose is to decrease the number of clinically evident and subclinical PreCancerous lesions to prevent progression to development of skin cancer by chemically destroying early precancer changes that may or may not be visible.  It has been shown to reduce the risk of developing skin cancer in the treated area. As a result of treatment, redness, scaling, crusting, and open sores may occur during treatment course. One or more than one of these methods may be used and may have to be used several times to control, suppress and eliminate the PreCancerous changes. Discussed treatment course, expected reaction, and possible side effects. - Recommend daily broad spectrum sunscreen SPF 30+ to sun-exposed areas, reapply every 2 hours as needed.  - Staying in the shade or wearing long sleeves, sun glasses (UVA+UVB protection) and wide brim hats (4-inch brim around the entire circumference of the  hat) are also recommended. - Call for new or changing lesions. Pt prefers PDT - plan PDT to face and chest/lower neck  Return for TBSE,  AK follow up, as scheduled and PDT to chest, face.  Graciella Belton, RMA, am acting as scribe for Forest Gleason, MD .  Documentation: I have reviewed the above documentation for accuracy and completeness, and I agree with the above.  Forest Gleason, MD

## 2021-12-25 NOTE — Patient Instructions (Signed)
Photodynamic Therapy/Blue Light Therapy  Actinic keratoses are the dry, red scaly spots on the skin caused by sun damage. A portion of these spots can turn into skin cancer with time, and treating them can help prevent development of skin cancer.   Treatment of these spots requires removal of the defective skin cells. There are various ways to remove actinic keratoses, including freezing with liquid nitrogen, treatment with creams, or treatment with a blue light procedure in the office.   Photodynamic Therapy (PDT), also known as "blue light therapy" is an in office procedure used to treat actinic keratoses. It works by targeting precancerous cells. After treatment, these cells peel off and are replaced by healthy ones.   For your phototherapy appointment, you will have two appointments on the day of your treatment. The first appointment will be to apply a cream to the treatment area. You will leave this cream on for 1-2 hours depending on the area being treated. The second appointment will be to shine a blue light on the area for 16 minutes to kill off the precancer cells. It is common to experience a burning sensation during the treatment.  After your treatment, it will be important to keep the treated areas of skin out of the sun completely for 48-72 hours (2-3 days) to prevent having a reaction.   Common side effects include: - Burning or stinging, which may be severe and can last up to 24-72 hours after your treatment - Scaling and crusting which may last up to 2 weeks - Redness, swelling and/or peeling which can last up to 4 weeks  To Care for Your Skin After PDT/Blue Light Therapy: - Wash with soap, water and shampoo as normal. - If needed, you can use cold compresses (e.g. ice packs) for comfort - If okay with your primary care doctor, you may use analgesics such as acetaminophen (tylenol) every 4-6 hours, not to exceed recommended dose - You may apply Cerave Healing Ointment, Vaseline  or Aquaphor as needed - If you have a lot of swelling you may take a Benadryl to help with this (this may cause drowsiness), not to exceed recommended dose. This may increase the risk of falls in people over 65 and may slow reaction time while driving, so it is not recommended to take before driving or operating machinery. - Sun Precautions - Wear a wide brim hat for the next week if outside  - Wear a sunblock with zinc or titanium dioxide at least SPF 50 daily  If you have any questions or concerns, please call the office and ask to speak with a nurse.   --------------------------------------------------------------------------------------------------------------  Cryotherapy Aftercare  Wash gently with soap and water everyday.   Apply Vaseline and Band-Aid daily until healed.    If You Need Anything After Your Visit  If you have any questions or concerns for your doctor, please call our main line at [phone] and press option 4 to reach your doctor's medical assistant. If no one answers, please leave a voicemail as directed and we will return your call as soon as possible. Messages left after 4 pm will be answered the following business day.   You may also send [place] a message via [place]. We typically respond to MyChart messages within 1-2 business days.  For prescription refills, please ask your pharmacy to contact our office. Our fax number is [phone].  If you have an urgent issue when the clinic is closed that cannot wait until the next business day,  you can page your doctor at the number below.    Please note that while we do our best to be available for urgent issues outside of office hours, we are not available 24/7.   If you have an urgent issue and are unable to reach [place], you may choose to seek medical care at your doctor's office, retail clinic, urgent care center, or emergency room.  If you have a medical emergency, please immediately call 911 or go to the emergency  department.  Pager Numbers  - Dr. Nehemiah Massed: [phone]  - Dr. Laurence Ferrari: [phone]  - Dr. Nicole Kindred: [phone]  In the event of inclement weather, please call our main line at [phone] for an update on the status of any delays or closures.  Dermatology Medication Tips: Please keep the boxes that topical medications come in in order to help keep track of the instructions about where and how to use these. Pharmacies typically print the medication instructions only on the boxes and not directly on the medication tubes.   If your medication is too expensive, please contact our office at [phone] option 4 or send [place] a message through [place].   We are unable to tell what your co-pay for medications will be in advance as this is different depending on your insurance coverage. However, we may be able to find a substitute medication at lower cost or fill out paperwork to get insurance to cover a needed medication.   If a prior authorization is required to get your medication covered by your insurance company, please allow [place] 1-2 business days to complete this process.  Drug prices often vary depending on where the prescription is filled and some pharmacies may offer cheaper prices.  The website www.goodrx.com contains coupons for medications through different pharmacies. The prices here do not account for what the cost may be with help from insurance (it may be cheaper with your insurance), but the website can give you the price if you did not use any insurance.  - You can print the associated coupon and take it with your prescription to the pharmacy.  - You may also stop by our office during regular business hours and pick up a GoodRx coupon card.  - If you need your prescription sent electronically to a different pharmacy, notify our office through [facility] or by phone at [phone] option 4.     Si Usted Necesita Algo Despus de Su Visita  Tambin puede enviarnos un  mensaje a travs de Pharmacist, community. Por lo general respondemos a los mensajes de MyChart en el transcurso de 1 a 2 das hbiles.  Para renovar recetas, por favor pida a su farmacia que se ponga en contacto con nuestra oficina. Harland Dingwall de fax es [place] [phone].  Si tiene un asunto urgente cuando la clnica est cerrada y que no puede esperar hasta el siguiente da hbil, puede llamar/localizar a su doctor(a) al nmero que aparece a continuacin.   Por favor, tenga en cuenta que aunque hacemos todo lo posible para estar disponibles para asuntos urgentes fuera del horario de [place], no estamos disponibles las 24 horas del da, los 7 das de la [place].   Si tiene un problema urgente y no puede comunicarse con nosotros, puede optar por buscar atencin mdica  en el consultorio de su doctor(a), en una clnica privada, en un centro de atencin urgente o en una sala de emergencias.  Si tiene una emergencia mdica, por favor llame inmediatamente al 911 o vaya a  la sala de emergencias.  Nmeros de bper  - Dr. Nehemiah Massed: [phone]  - Dra. Debie Ashline: [phone]  - Dra. Nicole Kindred: [phone]  En caso de inclemencias del [place], por favor llame a Johnsie Kindred principal al [phone] para una actualizacin sobre el [place] de cualquier retraso o cierre.  Consejos para la medicacin en dermatologa: Por favor, guarde las cajas en las que vienen los medicamentos de uso tpico para ayudarle a seguir las instrucciones sobre dnde y cmo usarlos. Las farmacias generalmente imprimen las instrucciones del medicamento slo en las cajas y no directamente en los tubos del [place].   Si su medicamento es muy caro, por favor, pngase en contacto con Zigmund Daniel llamando al [phone] y presione la opcin 4 o envenos un mensaje a travs de Pharmacist, community.   No podemos decirle cul ser su copago por los medicamentos por adelantado ya que esto es diferente dependiendo de la cobertura de su seguro. Sin embargo,  es posible que podamos encontrar un medicamento sustituto a Electrical engineer un formulario para que el seguro cubra el medicamento que se considera necesario.   Si se requiere una autorizacin previa para que su compaa de seguros [place] su medicamento, por favor permtanos de 1 a 2 das hbiles para completar este proceso.  Los precios de los medicamentos varan con frecuencia dependiendo del Environmental consultant de dnde se surte la receta y alguna farmacias pueden ofrecer precios ms baratos.  El sitio web www.goodrx.com tiene cupones para medicamentos de Airline pilot. Los precios aqu no tienen en cuenta lo que podra costar con la ayuda del seguro (puede ser ms barato con su seguro), pero el sitio web puede darle el precio si no utiliz Research scientist (physical sciences).  - Puede imprimir el cupn correspondiente y llevarlo con su receta a la farmacia.  - Tambin puede pasar por nuestra oficina durante el horario de atencin regular y Charity fundraiser una tarjeta de cupones de GoodRx.  - Si necesita que su receta se enve electrnicamente a una farmacia diferente, informe a nuestra oficina a travs de MyChart de [place] o por telfono llamando al [phone] y presione la opcin 4.

## 2021-12-26 ENCOUNTER — Encounter: Payer: Self-pay | Admitting: Cardiology

## 2021-12-26 ENCOUNTER — Ambulatory Visit: Payer: Medicare HMO | Admitting: Cardiology

## 2021-12-26 VITALS — BP 122/74 | HR 105 | Ht 74.0 in | Wt 230.0 lb

## 2021-12-26 DIAGNOSIS — I10 Essential (primary) hypertension: Secondary | ICD-10-CM | POA: Diagnosis not present

## 2021-12-26 DIAGNOSIS — E782 Mixed hyperlipidemia: Secondary | ICD-10-CM | POA: Diagnosis not present

## 2021-12-26 DIAGNOSIS — R000 Tachycardia, unspecified: Secondary | ICD-10-CM | POA: Diagnosis not present

## 2021-12-26 MED ORDER — MONTELUKAST SODIUM 10 MG PO TABS: 10.0000 mg | ORAL_TABLET | Freq: Every day | ORAL | 3 refills | Status: DC

## 2021-12-26 MED ORDER — DILTIAZEM HCL ER COATED BEADS 180 MG PO CP24: 180.0000 mg | ORAL_CAPSULE | Freq: Every day | ORAL | 5 refills | Status: DC

## 2021-12-26 NOTE — Addendum Note (Signed)
Addended by: Kavin Leech on: 12/26/2021 03:19 PM   Modules accepted: Orders

## 2021-12-26 NOTE — Progress Notes (Signed)
Cardiology Office Note:    Date:  12/26/2021   ID:  Russell Simpson, DOB 02-11-1961, MRN [id number]  PCP:  Charlynne Cousins, MD   [place] Providers Cardiologist:  Kate Sable, MD     Referring MD: Charlynne Cousins, MD   Chief Complaint  Patient presents with   OTher    3 month follow up. Meds reviewed verbally with patient.     History of Present Illness:    Russell Simpson is a 61 y.o. male with a hx of hypertension, hyperlipidemia, asthma, current smoker x15 years, lymphoma, inappropriate sinus tach who presents for follow-up.  Being seen for sinus tachycardia and hypertension.  Tachycardia began after starting chemo treatment for lymphoma.  Amlodipine was stopped, Cardizem was started after last visit.  Heart rates have improved, usually in the high 90s.  Denies palpitations, dizziness.  Prior notes Echo 07/2020 EF 60 to 65%, impaired relaxation. Cardiac monitor 08/2021.  No significant arrhythmias.  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[place])    Arthritis    Asthma    Basal cell carcinoma 01/28/2021   R upper arm, [facility] 03/04/2021   Brain 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   Chronic pain    right arm   Depression    Environmental and seasonal allergies    Follicular lymphoma ([place])    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   left neck, Moh's 05/29/21   Hypertension    Pneumonia    SCC (squamous cell carcinoma) 08/28/2021   upper chest right of midline, EDC done 09/17/2021   Sleep apnea    does not wear CPAP ; uses humidifier instead   Squamous cell carcinoma of skin 02/19/2021   L inferior mandible, treated with EDC   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/28/2021   R ant shoulder - ED&C   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/28/2021   L upper abdomen - ED&C    Past Surgical History:  Procedure Laterality Date   ANTERIOR CERVICAL DECOMP/DISCECTOMY FUSION N/A 06/17/2016   Procedure: ANTERIOR CERVICAL  DECOMPRESSION FUSION, CERVICAL 3-4, CERVICAL 4-5 WITH INSTRUMENTATION AND ALLOGRAFT;  Surgeon: Phylliss Bob, MD;  Location: [facility];  Service: Orthopedics;  Laterality: N/A;  ANTERIOR CERVICAL DECOMPRESSION FUSION, CERVICAL 3-4, CERVICAL 4-5 WITH INSTRUMENTATION AND ALLOGRAFT   BACK SURGERY     NECK SURGERY  2009   PORTA CATH INSERTION N/A 04/03/2021   Procedure: PORTA CATH INSERTION;  Surgeon: Algernon Huxley, MD;  Location: [facility];  Service: Cardiovascular;  Laterality: N/A;    Current Medications: Current Meds  Medication Sig   acetaminophen (TYLENOL) 325 MG tablet Take 2 tablets (650 mg total) by mouth daily. for 5 days starting 2 days prior to treatment   albuterol (VENTOLIN HFA) 108 (90 Base) MCG/ACT inhaler Inhale 2 puffs into the lungs every 6 (six) hours as needed for wheezing or shortness of breath.   allopurinol (ZYLOPRIM) 300 MG tablet TAKE 1 TABLET BY MOUTH ONCE DAILY   atorvastatin (LIPITOR) 40 MG tablet Take 1 tablet (40 mg total) by mouth daily.   buPROPion (WELLBUTRIN XL) 150 MG 24 hr tablet Take 1 tablet by mouth every morning.   clonazePAM (KLONOPIN) 0.5 MG tablet Take 0.5 mg by mouth daily.   diphenhydrAMINE (BENADRYL) 25 mg capsule Take 1 capsule (25 mg total) by mouth daily for 1 dose. Take 1 capsule daily for 5 days starting 2 days prior to treatment  diphenoxylate-atropine (LOMOTIL) 2.5-0.025 MG tablet Take 1 tablet by mouth 4 (four) times daily as needed for diarrhea or loose stools.   DULoxetine (CYMBALTA) 60 MG capsule TAKE 1 CAPSULE EVERY DAY   famotidine (PEPCID) 20 MG tablet TAKE 1 TABLET TWICE DAILY   fexofenadine (ALLEGRA ALLERGY) 180 MG tablet Take 1 tablet (180 mg total) by mouth daily.   finasteride (PROSCAR) 5 MG tablet TAKE 1 TABLET BY MOUTH ONCE DAILY   guaiFENesin-codeine (CHERATUSSIN AC) 100-10 MG/5ML syrup Take 5 mLs by mouth every evening.   lansoprazole (PREVACID) 30 MG capsule TAKE 1 CAPSULE BY MOUTH ONCE DAILY AT NOON   metoprolol  succinate (TOPROL-XL) 50 MG 24 hr tablet Take 2 tablets (100 mg total) by mouth daily. Take with or immediately following a meal.   mirtazapine (REMERON) 15 MG tablet Take 15 mg by mouth at bedtime.   mupirocin ointment (BACTROBAN) 2 % Apply 1 application topically 2 (two) times daily.   OLANZapine (ZYPREXA) 7.5 MG tablet Take 7.5 mg by mouth at bedtime.   ondansetron (ZOFRAN) 8 MG tablet Take 1 tablet (8 mg total) by mouth 2 (two) times daily as needed for refractory nausea / vomiting. Start on day 2 after bendamustine chemo.   prochlorperazine (COMPAZINE) 10 MG tablet TAKE 1 TABLET BY MOUTH EVERY 6 HOURS AS NEEDED NAUSEA AND VOMITING   sildenafil (REVATIO) 20 MG tablet TAKE 3-5 TABLETS BY MOUTH ONCE DAILY AS NEEDED FOR ERECTILE DYSFUNCTION   tacrolimus (PROTOPIC) 0.1 % ointment Apply 1 application topically 2 (two) times daily.   testosterone cypionate (DEPOTESTOSTERONE CYPIONATE) 200 MG/ML injection Inject 1 mL (200 mg total) into the muscle every 14 (fourteen) days.   Tiotropium Bromide Monohydrate (SPIRIVA RESPIMAT) 2.5 MCG/ACT AERS Inhale 2 puffs into the lungs daily.   triamcinolone (KENALOG) 0.1 % Apply to affected areas 1-2 times a day until rash improved. Avoid face, groin, underarms.   [DISCONTINUED] diltiazem (CARDIZEM CD) 120 MG 24 hr capsule Take 1 capsule (120 mg total) by mouth daily.     Allergies:   Penicillins, Tetanus toxoids, Lisinopril, Losartan, Tetanus toxoid, Codeine, Doxycycline, and Ruxience [rituximab-pvvr]   Social History   Socioeconomic History   Marital status: Divorced    Spouse name: Not on file   Number of children: Not on file   Years of education: Not on file   Highest education level: Not on file  Occupational History   Not on file  Tobacco Use   Smoking status: Every Day    Types: Cigars   Smokeless tobacco: Never   Tobacco comments:    5-6 cigars a day  Vaping Use   Vaping Use: Former   Start date: 04/30/2017   Quit date: 11/30/2017  Substance  and Sexual Activity   Alcohol use: Not Currently    Comment: beers 12 a day   Drug use: No   Sexual activity: Not on file  Other Topics Concern   Not on file  Social History Narrative   Not on file   Social Determinants of Health   Financial Resource Strain: High Risk   Difficulty of Paying Living Expenses: Hard  Food Insecurity: Food Insecurity Present   Worried About Charity fundraiser in the Last Year: Often true   Ran Out of Food in the Last Year: Often true  Transportation Needs: No Transportation Needs   Lack of Transportation (Medical): No   Lack of Transportation (Non-Medical): No  Physical Activity: Inactive   Days of Exercise per Week: 0 days  Minutes of Exercise per Session: 0 min  Stress: No Stress Concern Present   Feeling of Stress : Only a little  Social Connections: Socially Isolated   Frequency of Communication with Friends and Family: More than three times a week   Frequency of Social Gatherings with Friends and Family: More than three times a week   Attends Religious Services: Never   Marine scientist or Organizations: No   Attends Music therapist: Never   Marital Status: Divorced     Family History: The patient's family history includes Diabetes in his mother and another family member.  ROS:   Please see the history of present illness.     All other systems reviewed and are negative.  EKGs/Labs/Other Studies Reviewed:    The following studies were reviewed today:   EKG:  EKG is  ordered today.  The ekg ordered today demonstrates sinus tachycardia, heart rate 105  Recent Labs: 05/14/2021: B Natriuretic Peptide 11.4 05/20/2021: Magnesium 1.9 08/15/2021: TSH 4.070 09/24/2021: ALT 35; BUN 8; Creatinine, Ser 0.70; Hemoglobin 14.1; Platelets 125; Potassium 4.4; Sodium 134  Recent Lipid Panel    Component Value Date/Time   CHOL 255 (H) 02/14/2019 1513   TRIG 155 (H) 02/14/2019 1513   HDL 53 02/14/2019 1513   CHOLHDL 4.8  02/14/2019 1513   CHOLHDL 2.6 10/15/2017 0906   LDLCALC 171 (H) 02/14/2019 1513   LDLCALC 108 (H) 10/15/2017 0906     Risk Assessment/Calculations:          Physical Exam:    VS:  BP 122/74 (BP Location: Left Arm, Patient Position: Sitting, Cuff Size: Normal)    Pulse (!) 105    Ht 6\' 2"  (1.88 m)    Wt 230 lb (104.3 kg)    SpO2 95%    BMI 29.53 kg/m     Wt Readings from Last 3 Encounters:  12/26/21 230 lb (104.3 kg)  12/05/21 234 lb (106.1 kg)  09/24/21 229 lb (103.9 kg)     GEN:  Well nourished, well developed in no acute distress HEENT: Normal NECK: No JVD; No carotid bruits LYMPHATICS: No lymphadenopathy CARDIAC: Tachycardia, regular, no murmurs, rubs, gallops RESPIRATORY:  Clear to auscultation without rales, wheezing or rhonchi  ABDOMEN: Soft, non-tender, non-distended MUSCULOSKELETAL:  No edema; No deformity  SKIN: Warm and dry NEUROLOGIC:  Alert and oriented x 3 PSYCHIATRIC:  Normal affect   ASSESSMENT:    1. Inappropriate sinus tachycardia   2. Primary hypertension   3. Hyperlipidemia, mixed     PLAN:    In order of problems listed above:  Patient with inappropriate sinus tach, overall improved with beta-blocker, EKG showing heart rate 105 today.  Increase Cardizem to 180 mg daily.  Continue Toprol-XL. Hypertension, BP controlled. Cardizem, Toprol-XL as above. Hyperlipidemia, continue Lipitor, repeat fasting lipid profile prior to follow-up visit.  Follow-up in 6 months.     Medication Adjustments/Labs and Tests Ordered: Current medicines are reviewed at length with the patient today.  Concerns regarding medicines are outlined above.  Orders Placed This Encounter  Procedures   EKG 12-Lead   Meds ordered this encounter  Medications   diltiazem (CARDIZEM CD) 180 MG 24 hr capsule    Sig: Take 1 capsule (180 mg total) by mouth daily.    Dispense:  30 capsule    Refill:  5    Stop Amlodipine    Patient Instructions  Medication Instructions:    Your physician has recommended you make the following change  in your medication:   INCREASE your Diltiazem to 180 MG once a day. (You may take 1.5 tablets of your 120 mg tablets to use up what you have)    *If you need a refill on your cardiac medications before your next appointment, please call your pharmacy*   Lab Work: None ordered If you have labs (blood work) drawn today and your tests are completely normal, you will receive your results only by: [place] (if you have MyChart) OR A paper copy in the mail If you have any lab test that is abnormal or we need to change your treatment, we will call you to review the results.   Testing/Procedures: None Ordered   Follow-Up: At [facility], you and your health needs are our priority.  As part of our continuing mission to provide you with exceptional heart care, we have created designated Provider Care Teams.  These Care Teams include your primary Cardiologist (physician) and Advanced Practice Providers (APPs -  Physician Assistants and Nurse Practitioners) who all work together to provide you with the care you need, when you need it.  We recommend signing up for the patient portal called "MyChart".  Sign up information is provided on this After Visit Summary.  MyChart is used to connect with patients for Virtual Visits (Telemedicine).  Patients are able to view lab/test results, encounter notes, upcoming appointments, etc.  Non-urgent messages can be sent to your provider as well.   To learn more about what you can do with MyChart, go to [email or web address].    Your next appointment:   6 month(s)  The format for your next appointment:   In Person  Provider:   You may see Kate Sable, MD or one of the following Advanced Practice Providers on your designated Care Team:   Murray Hodgkins, NP Christell Faith, PA-C Cadence Kathlen Mody, [place]    Other Instructions     Signed, Kate Sable, MD  12/26/2021 2:49 PM     [place]

## 2021-12-26 NOTE — Patient Instructions (Addendum)
Medication Instructions:   Your physician has recommended you make the following change in your medication:   INCREASE your Diltiazem to 180 MG once a day. (You may take 1.5 tablets of your 120 mg tablets to use up what you have)    *If you need a refill on your cardiac medications before your next appointment, please call your pharmacy*   Lab Work:  Your physician recommends that you return for a FASTING lipid profile: The week prior to your 6 month follow up.  - You will need to be fasting. Please do not have anything to eat or drink after midnight the morning you have the lab work. You may only have water or black coffee with no cream or sugar.   We will call you closer to your appointment time and schedule you in our office for this lab draw.   Testing/Procedures: None Ordered   Follow-Up: At [facility], you and your health needs are our priority.  As part of our continuing mission to provide you with exceptional heart care, we have created designated Provider Care Teams.  These Care Teams include your primary Cardiologist (physician) and Advanced Practice Providers (APPs -  Physician Assistants and Nurse Practitioners) who all work together to provide you with the care you need, when you need it.  We recommend signing up for the patient portal called "MyChart".  Sign up information is provided on this After Visit Summary.  MyChart is used to connect with patients for Virtual Visits (Telemedicine).  Patients are able to view lab/test results, encounter notes, upcoming appointments, etc.  Non-urgent messages can be sent to your provider as well.   To learn more about what you can do with MyChart, go to [email or web address].    Your next appointment:   6 month(s)  The format for your next appointment:   In Person  Provider:   You may see Kate Sable, MD or one of the following Advanced Practice Providers on your designated Care Team:   Murray Hodgkins, NP Christell Faith, PA-C Cadence Kathlen Mody, [place]    Other Instructions

## 2021-12-29 ENCOUNTER — Other Ambulatory Visit: Payer: Medicare HMO

## 2021-12-29 ENCOUNTER — Ambulatory Visit: Payer: Medicare HMO | Admitting: Oncology

## 2021-12-29 ENCOUNTER — Other Ambulatory Visit: Payer: Self-pay

## 2021-12-29 DIAGNOSIS — K219 Gastro-esophageal reflux disease without esophagitis: Secondary | ICD-10-CM

## 2021-12-29 DIAGNOSIS — F3160 Bipolar disorder, current episode mixed, unspecified: Secondary | ICD-10-CM

## 2021-12-29 DIAGNOSIS — F410 Panic disorder [episodic paroxysmal anxiety] without agoraphobia: Secondary | ICD-10-CM

## 2021-12-29 DIAGNOSIS — R059 Cough, unspecified: Secondary | ICD-10-CM

## 2021-12-29 DIAGNOSIS — F4310 Post-traumatic stress disorder, unspecified: Secondary | ICD-10-CM

## 2021-12-29 NOTE — Telephone Encounter (Signed)
Please see if he has enough of his medicine to last until 2/6

## 2021-12-30 ENCOUNTER — Ambulatory Visit: Payer: Medicare HMO | Admitting: Internal Medicine

## 2021-12-30 ENCOUNTER — Inpatient Hospital Stay: Payer: Medicare HMO

## 2021-12-30 ENCOUNTER — Inpatient Hospital Stay: Payer: Medicare HMO | Attending: Oncology | Admitting: Oncology

## 2021-12-30 ENCOUNTER — Encounter: Payer: Self-pay | Admitting: Oncology

## 2021-12-30 ENCOUNTER — Other Ambulatory Visit: Payer: Self-pay

## 2021-12-30 VITALS — BP 119/87 | HR 120 | Temp 97.5°F | Resp 16 | Ht 74.0 in | Wt 228.0 lb

## 2021-12-30 DIAGNOSIS — Z08 Encounter for follow-up examination after completed treatment for malignant neoplasm: Secondary | ICD-10-CM

## 2021-12-30 DIAGNOSIS — C8202 Follicular lymphoma grade I, intrathoracic lymph nodes: Secondary | ICD-10-CM

## 2021-12-30 DIAGNOSIS — C8200 Follicular lymphoma grade I, unspecified site: Secondary | ICD-10-CM | POA: Insufficient documentation

## 2021-12-30 DIAGNOSIS — R000 Tachycardia, unspecified: Secondary | ICD-10-CM

## 2021-12-30 DIAGNOSIS — E291 Testicular hypofunction: Secondary | ICD-10-CM | POA: Insufficient documentation

## 2021-12-30 DIAGNOSIS — F17210 Nicotine dependence, cigarettes, uncomplicated: Secondary | ICD-10-CM | POA: Diagnosis not present

## 2021-12-30 DIAGNOSIS — T451X5A Adverse effect of antineoplastic and immunosuppressive drugs, initial encounter: Secondary | ICD-10-CM | POA: Insufficient documentation

## 2021-12-30 DIAGNOSIS — Z8572 Personal history of non-Hodgkin lymphomas: Secondary | ICD-10-CM

## 2021-12-30 DIAGNOSIS — D696 Thrombocytopenia, unspecified: Secondary | ICD-10-CM | POA: Insufficient documentation

## 2021-12-30 DIAGNOSIS — R768 Other specified abnormal immunological findings in serum: Secondary | ICD-10-CM

## 2021-12-30 DIAGNOSIS — Z79899 Other long term (current) drug therapy: Secondary | ICD-10-CM | POA: Diagnosis not present

## 2021-12-30 DIAGNOSIS — I10 Essential (primary) hypertension: Secondary | ICD-10-CM | POA: Diagnosis not present

## 2021-12-30 DIAGNOSIS — D702 Other drug-induced agranulocytosis: Secondary | ICD-10-CM

## 2021-12-30 DIAGNOSIS — D701 Agranulocytosis secondary to cancer chemotherapy: Secondary | ICD-10-CM | POA: Insufficient documentation

## 2021-12-30 LAB — COMPREHENSIVE METABOLIC PANEL
ALT: 31 U/L (ref 0–44)
AST: 40 U/L (ref 15–41)
Albumin: 3.7 g/dL (ref 3.5–5.0)
Alkaline Phosphatase: 133 U/L — ABNORMAL HIGH (ref 38–126)
Anion gap: 12 (ref 5–15)
BUN: 6 mg/dL (ref 6–20)
CO2: 25 mmol/L (ref 22–32)
Calcium: 8.8 mg/dL — ABNORMAL LOW (ref 8.9–10.3)
Chloride: 99 mmol/L (ref 98–111)
Creatinine, Ser: 0.91 mg/dL (ref 0.61–1.24)
GFR, Estimated: >60 mL/min (ref >60)
Glucose, Bld: 109 mg/dL — ABNORMAL HIGH (ref 70–99)
Potassium: 3.9 mmol/L (ref 3.5–5.1)
Sodium: 136 mmol/L (ref 135–145)
Total Bilirubin: 0.5 mg/dL (ref 0.3–1.2)
Total Protein: 6.3 g/dL — ABNORMAL LOW (ref 6.5–8.1)

## 2021-12-30 LAB — CBC WITH DIFFERENTIAL/PLATELET
Abs Immature Granulocytes: 0.15 10*3/uL — ABNORMAL HIGH (ref 0.00–0.07)
Basophils Absolute: 0 10*3/uL (ref 0.0–0.1)
Basophils Relative: 0 %
Eosinophils Absolute: 0.2 10*3/uL (ref 0.0–0.5)
Eosinophils Relative: 5 %
HCT: 40.7 % (ref 39.0–52.0)
Hemoglobin: 14.7 g/dL (ref 13.0–17.0)
Immature Granulocytes: 5 %
Lymphocytes Relative: 42 %
Lymphs Abs: 1.4 10*3/uL (ref 0.7–4.0)
MCH: 33.3 pg (ref 26.0–34.0)
MCHC: 36.1 g/dL — ABNORMAL HIGH (ref 30.0–36.0)
MCV: 92.3 fL (ref 80.0–100.0)
Monocytes Absolute: 0.7 10*3/uL (ref 0.1–1.0)
Monocytes Relative: 22 %
Neutro Abs: 0.9 10*3/uL — ABNORMAL LOW (ref 1.7–7.7)
Neutrophils Relative %: 26 %
Platelets: 141 10*3/uL — ABNORMAL LOW (ref 150–400)
RBC: 4.41 MIL/uL (ref 4.22–5.81)
RDW: 12.6 % (ref 11.5–15.5)
WBC: 3.3 10*3/uL — ABNORMAL LOW (ref 4.0–10.5)
nRBC: 0 % (ref 0.0–0.2)

## 2021-12-30 LAB — LACTATE DEHYDROGENASE: LDH: 191 U/L (ref 98–192)

## 2021-12-30 MED ORDER — DULOXETINE HCL 60 MG PO CPEP: 60.0000 mg | ORAL_CAPSULE | Freq: Every day | ORAL | 0 refills | Status: DC

## 2021-12-30 MED ORDER — FAMOTIDINE 20 MG PO TABS: 20.0000 mg | ORAL_TABLET | Freq: Two times a day (BID) | ORAL | 0 refills | Status: DC

## 2021-12-30 MED ORDER — METOPROLOL SUCCINATE ER 50 MG PO TB24: 100.0000 mg | ORAL_TABLET | Freq: Every day | ORAL | 0 refills | Status: DC

## 2021-12-30 MED ORDER — HEPARIN SOD (PORK) LOCK FLUSH 100 UNIT/ML IV SOLN
500.0000 [IU] | Freq: Once | INTRAVENOUS | Status: AC
  Administered 2021-12-30: 500 [IU] via INTRAVENOUS
  Filled 2021-12-30: qty 5

## 2021-12-30 MED ORDER — SODIUM CHLORIDE 0.9% FLUSH
10.0000 mL | Freq: Once | INTRAVENOUS | Status: AC
  Administered 2021-12-30: 10 mL via INTRAVENOUS
  Filled 2021-12-30: qty 10

## 2021-12-30 NOTE — Addendum Note (Signed)
Addended by: Luella Cook on: 12/30/2021 08:35 PM   Modules accepted: Orders

## 2021-12-30 NOTE — Progress Notes (Signed)
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 [name]  He sustained a left anterior frontal sinus as well as supraorbital rim fracture on 11/21/2019 which was managed conservatively.  He then presented to the ER at [facility] with symptoms of right-sided chest wall pain this led to a CT PE which did not show any evidence of pulmonary embolism.  He was noted to have bilateral symmetric axillary adenopathy measuring up to 1.8 cm.  Scattered mediastinal adenopathy.  Right paratracheal node measuring up to 1.2 cm.  Prominent right costophrenic lymph node measuring up to 1 cm.  Findings are nonspecific but could be seen with lymphoma versus systemic inflammatory disease such as sarcoidosis.  Of note patient has had a prior CT scan for lung screening back in 2015 when he was not noted to have this adenopathy.    Repeat CT chest in August 2021 showed persistent mild nonbulky axillary and mediastinal adenopathy which had not changed significantly as compared to his prior CT in December 2020.  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   Repeat CT in March 2022 showed Progression and multistation adenopathy as well as significant splenomegaly measuring 20.4 cm as compared to 14.5 cm on CT scan  September 2021.  CT scan showed multistation adenopathy with SUVs ranging between 6-8.9.  Patient underwent another core biopsy of right inguinal lymph node which was consistent with follicular lymphoma grade 1-2.  Bone marrow biopsy also showed moderate involvement with non-Hodgkin's B-cell lymphoma.  Baseline hepatitis B testing showed core antibody positivity.  Seen by GI and started on tenofovir   Patient had symptoms of nausea and sweating during cycle 1 of Rituxan which resolved with additional premedications.  He developed symptoms of itching over neck chest back and bilateral eyes with second dose of Rituxan early and had to get more premedications and was not rechallenged on the same day.  Patient subsequently received cycle 3 of Bendamustine Rituxan chemotherapy after Rituxan was given as a split dose over 3 days   Scans after 4 cycles of Bendamustine Rituxan chemotherapy showedSignificant improvement with therapy.  Lymphadenopathy in his neck and mediastinum resolved Deauville 2.  Decrease in the splenic size and hypermetabolism.  No new lesions.  FDG accumulation within the skeleton presumably due to stimulatory effects of treatment.    Interval history-he saw psychiatry recently per patient after his dad passed away and was prescribed antianxiety medication.  He continues to drink alcohol although he is trying to cut down.  He has not been successful in quitting smoking so far.  ECOG PS- 1 Pain scale- 0  Review of systems- Review of Systems  Constitutional:  Positive for malaise/fatigue.  Psychiatric/Behavioral:  The patient is nervous/anxious.       Allergies  Allergen Reactions   Penicillins Anaphylaxis    Tolerates cefdinir, cephalexin and amoxicillin/clavulonate so true PCN  allergy unlikely   Tetanus Toxoids Swelling   Lisinopril Cough   Losartan      Other reaction(s): Muscle Pain     Tetanus Toxoid    Codeine Nausea Only and Nausea And Vomiting   Doxycycline Rash    Ruxience [Rituximab-Pvvr] Rash    05/06/21 pt w/ worsening rash during Ruxience infusion.  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[place])    Arthritis    Asthma    Basal cell carcinoma 01/28/2021   R upper arm, [facility] 03/04/2021   Brain 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   Chronic pain    right arm   Depression    Environmental and seasonal allergies    Follicular lymphoma ([place])    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   left neck, Moh's 05/29/21   Hypertension    Pneumonia    SCC (squamous cell carcinoma) 08/28/2021   upper chest right of midline, EDC done 09/17/2021   Sleep apnea    does not wear CPAP ; uses humidifier instead   Squamous cell carcinoma of skin 02/19/2021   L inferior mandible, treated with EDC   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/28/2021   R ant shoulder - ED&C   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/28/2021   L upper abdomen - ED&C     Past Surgical History:  Procedure Laterality Date   ANTERIOR CERVICAL DECOMP/DISCECTOMY FUSION N/A 06/17/2016   Procedure: ANTERIOR CERVICAL DECOMPRESSION FUSION, CERVICAL 3-4, CERVICAL 4-5 WITH INSTRUMENTATION AND ALLOGRAFT;  Surgeon: Phylliss Bob, MD;  Location: [place];  Service: Orthopedics;  Laterality: N/A;  ANTERIOR CERVICAL DECOMPRESSION FUSION, CERVICAL 3-4, CERVICAL 4-5 WITH INSTRUMENTATION AND ALLOGRAFT   BACK SURGERY     NECK SURGERY  2009   PORTA CATH INSERTION N/A 04/03/2021   Procedure: PORTA CATH INSERTION;  Surgeon: Algernon Huxley, MD;  Location: [place] [facility];  Service: Cardiovascular;  Laterality: N/A;    Social History   Socioeconomic History   Marital status: Divorced    Spouse name: Not on file   Number of children: Not on file   Years of education: Not on file   Highest education level: Not on file  Occupational History   Not on file  Tobacco Use   Smoking status: Every Day    Types: Cigars    Smokeless tobacco: Never   Tobacco comments:    8 a day  Vaping Use   Vaping Use: Former   Start date: 04/30/2017   Quit date: 11/30/2017  Substance and Sexual Activity   Alcohol use: Yes    Comment: beers 12 a day   Drug use: No   Sexual activity: Not Currently  Other Topics Concern   Not on file  Social History Narrative   Not on file   Social Determinants of Health   Financial Resource Strain: High Risk   Difficulty of Paying Living Expenses: Hard  Food Insecurity: Food Insecurity Present   Worried About [place] in the Last Year: Often true   Ran Out of Food in the Last Year: Often true  Transportation Needs: No Transportation Needs   Lack of Transportation (Medical): No   Lack of Transportation (Non-Medical): No  Physical Activity: Inactive   Days of Exercise per Week: 0 days   Minutes of Exercise per Session: 0 min  Stress: No Stress Concern  Present   Feeling of Stress : Only a little  Social Connections: Socially Isolated   Frequency of Communication with Friends and Family: More than three times a week   Frequency of Social Gatherings with Friends and Family: More than three times a week   Attends Religious Services: Never   Marine scientist or Organizations: No   Attends Music therapist: Never   Marital Status: Divorced  Human resources officer Violence: Not At Risk   Fear of Current or Ex-Partner: No   Emotionally Abused: No   Physically Abused: No   Sexually Abused: No    Family History  Problem Relation Age of Onset   Diabetes Mother    Heart attack Father    Emphysema Father    Diabetes Other      Current Outpatient Medications:    acetaminophen (TYLENOL) 325 MG tablet, Take 2 tablets (650 mg total) by mouth daily. for 5 days starting 2 days prior to treatment, Disp: 30 tablet, Rfl: 0   albuterol (VENTOLIN HFA) 108 (90 Base) MCG/ACT inhaler, Inhale 2 puffs into the lungs every 6 (six) hours as needed for wheezing or  shortness of breath., Disp: 8 g, Rfl: 2   allopurinol (ZYLOPRIM) 300 MG tablet, TAKE 1 TABLET BY MOUTH ONCE DAILY, Disp: 30 tablet, Rfl: 3   atorvastatin (LIPITOR) 40 MG tablet, Take 1 tablet (40 mg total) by mouth daily., Disp: 90 tablet, Rfl: 4   buPROPion (WELLBUTRIN XL) 150 MG 24 hr tablet, Take 1 tablet by mouth every morning., Disp: , Rfl:    clonazePAM (KLONOPIN) 0.5 MG tablet, Take 0.5 mg by mouth daily., Disp: , Rfl:    diltiazem (CARDIZEM CD) 180 MG 24 hr capsule, Take 1 capsule (180 mg total) by mouth daily., Disp: 30 capsule, Rfl: 5   diphenhydrAMINE (BENADRYL) 25 mg capsule, Take 1 capsule (25 mg total) by mouth daily for 1 dose. Take 1 capsule daily for 5 days starting 2 days prior to treatment (Patient taking differently: Take 25 mg by mouth at bedtime as needed.), Disp: 30 capsule, Rfl: 0   DULoxetine (CYMBALTA) 60 MG capsule, Take 1 capsule (60 mg total) by mouth daily., Disp: 90 capsule, Rfl: 0   famotidine (PEPCID) 20 MG tablet, Take 1 tablet (20 mg total) by mouth 2 (two) times daily., Disp: 180 tablet, Rfl: 0   fexofenadine (ALLEGRA ALLERGY) 180 MG tablet, Take 1 tablet (180 mg total) by mouth daily., Disp: 10 tablet, Rfl: 1   finasteride (PROSCAR) 5 MG tablet, TAKE 1 TABLET BY MOUTH ONCE DAILY, Disp: 90 tablet, Rfl: 2   lansoprazole (PREVACID) 30 MG capsule, TAKE 1 CAPSULE BY MOUTH ONCE DAILY AT NOON, Disp: 30 capsule, Rfl: 2   metoprolol succinate (TOPROL-XL) 50 MG 24 hr tablet, Take 2 tablets (100 mg total) by mouth daily. Take with or immediately following a meal., Disp: 90 tablet, Rfl: 0   mirtazapine (REMERON) 15 MG tablet, Take 15 mg by mouth at bedtime., Disp: , Rfl:    montelukast (SINGULAIR) 10 MG tablet, Take 1 tablet (10 mg total) by mouth at bedtime., Disp: 30 tablet, Rfl: 3   sildenafil (REVATIO) 20 MG tablet, TAKE 3-5 TABLETS BY MOUTH ONCE DAILY AS NEEDED FOR ERECTILE DYSFUNCTION, Disp: 30 tablet, Rfl: 0   testosterone cypionate (DEPOTESTOSTERONE CYPIONATE) 200  MG/ML injection, Inject 1 mL (200 mg total) into the muscle every 14 (fourteen) days., Disp: 10 mL, Rfl: 0   Tiotropium Bromide Monohydrate (SPIRIVA RESPIMAT) 2.5 MCG/ACT AERS, Inhale  2 puffs into the lungs daily., Disp: 1 each, Rfl: 1   diphenoxylate-atropine (LOMOTIL) 2.5-0.025 MG tablet, Take 1 tablet by mouth 4 (four) times daily as needed for diarrhea or loose stools. (Patient not taking: Reported on 12/30/2021), Disp: 30 tablet, Rfl: 0 No current facility-administered medications for this visit.  Facility-Administered Medications Ordered in Other Visits:    heparin lock flush 100 unit/mL, 500 Units, Intravenous, Once, Sindy Guadeloupe, MD   heparin lock flush 100 unit/mL, 500 Units, Intravenous, Once, Sindy Guadeloupe, MD   sodium chloride flush (NS) 0.9 % injection 10 mL, 10 mL, Intravenous, PRN, Sindy Guadeloupe, MD  Physical exam:  Vitals:   12/30/21 1458  BP: 119/87  Pulse: (!) 120  Resp: 16  Temp: (!) 97.5 F (36.4 C)  TempSrc: Oral  Weight: 228 lb (103.4 kg)  Height: $Remove'6\' 2"'CCSQzuD$  (1.88 m)   Physical Exam Constitutional:      General: He is not in acute distress. Cardiovascular:     Rate and Rhythm: Regular rhythm. Tachycardia present.     Heart sounds: Normal heart sounds.  Pulmonary:     Effort: Pulmonary effort is normal.     Breath sounds: Normal breath sounds.  Abdominal:     General: Bowel sounds are normal.     Palpations: Abdomen is soft.     Comments: No palpable hepatosplenomegaly  Lymphadenopathy:     Comments: No palpable cervical, supraclavicular, axillary or inguinal adenopathy    Skin:    General: Skin is warm and dry.  Neurological:     Mental Status: He is alert and oriented to person, place, and time.     CMP Latest Ref Rng & Units 12/30/2021  Glucose 70 - 99 mg/dL 109(H)  BUN 6 - 20 mg/dL 6  Creatinine 0.61 - 1.24 mg/dL 0.91  Sodium 135 - 145 mmol/L 136  Potassium 3.5 - 5.1 mmol/L 3.9  Chloride 98 - 111 mmol/L 99  CO2 22 - 32 mmol/L 25  Calcium 8.9  - 10.3 mg/dL 8.8(L)  Total Protein 6.5 - 8.1 g/dL 6.3(L)  Total Bilirubin 0.3 - 1.2 mg/dL 0.5  Alkaline Phos 38 - 126 U/L 133(H)  AST 15 - 41 U/L 40  ALT 0 - 44 U/L 31   CBC Latest Ref Rng & Units 12/30/2021  WBC 4.0 - 10.5 K/uL 3.3(L)  Hemoglobin 13.0 - 17.0 g/dL 14.7  Hematocrit 39.0 - 52.0 % 40.7  Platelets 150 - 400 K/uL 141(L)    No images are attached to the encounter.  DG Chest 2 View  Result Date: 12/12/2021 CLINICAL DATA:  Cough and congestion.  History of COPD. EXAM: CHEST - 2 VIEW COMPARISON:  11/04/2021 FINDINGS: Lateral view degraded by patient arm position. Right Port-A-Cath tip at high SVC. Midline trachea. Normal heart size and mediastinal contours. No pleural effusion or pneumothorax. Clear lungs. IMPRESSION: No active cardiopulmonary disease. Electronically Signed   By: Abigail Miyamoto M.D.   On: 12/12/2021 09:31     Assessment and plan- Patient is a 61 y.o. male  with stage IV follicular lymphoma grade 1 s/p 6 cycles of Bendamustine Rituxan chemotherapy.  He is here for routine follow-up  Clinically patient is doing well with no concerning signs and symptoms of recurrence based on today's exam.  His white count is 3.3 with a neutrophil count of 0.9.  Sometimes delayed neutropenia can be seen with Rituxan I will plan to repeat his CBC with differential in 6 weeks time.  Thrombocytopenia can also  be secondary to Rituxan.  With regards to his follicular lymphoma we will plan to repeat CBC with differential CMP LDH and systemic scans in 3 months time and see him thereafter.  Sinus tachycardia: Chronic.  He sees Dr. Garen Lah from cardiology.  Smoking cessation: I will have NP Beckey Rutter reach out to him since his present measures have not been helping him quit smoking.  Patient also had history of hepatitis core antibody positive and was supposed to be takingHepatitis prophylaxis post Rituxan and during Rituxan treatment.  Patient states that he has been out of his  medications for a week now and has not received any refills.  I will reach out to Dr. Vicente Males about this.   Visit Diagnosis 1. Encounter for follow-up surveillance of lymphoma   2. Hepatitis B core antibody positive   3. Sinus tachycardia   4. Drug-induced neutropenia ([place])      Dr. Randa Evens, MD, MPH [facility] at [facility] [phone] 12/30/2021 4:41 PM

## 2021-12-30 NOTE — Progress Notes (Signed)
Pt states doing ok. He has been trying to stop cigars and used the inhaler for it and it burned in his nose and it made him feel like smoking more not less. He had been sick with sinuses and finally better. His md gave him some med to help with depression because of dad passing away

## 2021-12-30 NOTE — Progress Notes (Signed)
Survivorship Care Plan visit completed.  Treatment summary reviewed and given to patient.  ASCO answers booklet reviewed and given to patient.  CARE program and Cancer Transitions discussed with patient along with other resources cancer center offers to patients and caregivers.  Patient verbalized understanding.    

## 2022-01-01 ENCOUNTER — Telehealth: Payer: Self-pay

## 2022-01-01 NOTE — Telephone Encounter (Signed)
Called patient but had to leave him a voicemail letting him know that Dr. Vicente Males wants him to schedule a virtual visit within this week or next week. Waiting for a call back.

## 2022-01-02 ENCOUNTER — Other Ambulatory Visit: Payer: Self-pay | Admitting: Internal Medicine

## 2022-01-02 DIAGNOSIS — F3160 Bipolar disorder, current episode mixed, unspecified: Secondary | ICD-10-CM

## 2022-01-02 DIAGNOSIS — F4310 Post-traumatic stress disorder, unspecified: Secondary | ICD-10-CM

## 2022-01-02 DIAGNOSIS — R059 Cough, unspecified: Secondary | ICD-10-CM

## 2022-01-02 DIAGNOSIS — F410 Panic disorder [episodic paroxysmal anxiety] without agoraphobia: Secondary | ICD-10-CM

## 2022-01-02 DIAGNOSIS — I10 Essential (primary) hypertension: Secondary | ICD-10-CM

## 2022-01-02 DIAGNOSIS — K219 Gastro-esophageal reflux disease without esophagitis: Secondary | ICD-10-CM

## 2022-01-02 MED ORDER — FAMOTIDINE 20 MG PO TABS: 20.0000 mg | ORAL_TABLET | Freq: Two times a day (BID) | ORAL | 0 refills | Status: DC

## 2022-01-02 MED ORDER — METOPROLOL SUCCINATE ER 50 MG PO TB24: 100.0000 mg | ORAL_TABLET | Freq: Every day | ORAL | 0 refills | Status: DC

## 2022-01-02 MED ORDER — DULOXETINE HCL 60 MG PO CPEP: 60.0000 mg | ORAL_CAPSULE | Freq: Every day | ORAL | 0 refills | Status: DC

## 2022-01-02 MED ORDER — MONTELUKAST SODIUM 10 MG PO TABS: 10.0000 mg | ORAL_TABLET | Freq: Every day | ORAL | 0 refills | Status: DC

## 2022-01-02 NOTE — Telephone Encounter (Signed)
Sending to different pharmacy, mail order

## 2022-01-02 NOTE — Telephone Encounter (Signed)
Copied from [place] [id number]. Topic: Quick Communication - Rx Refill/Question >> Jan 02, 2022  1:19 PM Leward Quan A wrote: Medication: DULoxetine (CYMBALTA) 60 MG capsule, famotidine (PEPCID) 20 MG tablet, metoprolol succinate (TOPROL-XL) 50 MG 24 hr tablet, montelukast (SINGULAIR) 10 MG tablet amLODipine (NORVASC) 5 MG tablet  Has the patient contacted their pharmacy? Yes.   (Agent: If no, request that the patient contact the pharmacy for the refill. If patient does not wish to contact the pharmacy document the reason why and proceed with request.) (Agent: If yes, when and what did the pharmacy advise?)  Preferred Pharmacy (with phone number or street name): [place], [place]  Phone:  [phone] Fax:  [phone]    Has the patient been seen for an appointment in the last year OR does the patient have an upcoming appointment? Yes.    Agent: Please be advised that RX refills may take up to 3 business days. We ask that you follow-up with your pharmacy.

## 2022-01-02 NOTE — Telephone Encounter (Signed)
Requested medication (s) are due for refill today: yes  Requested medication (s) are on the active medication list: no  Last refill:  unsure  Future visit scheduled: yes  Notes to clinic:  med was dc'd on 09/19/21. Please advise     Requested Prescriptions  Pending Prescriptions Disp Refills   amLODipine (NORVASC) 5 MG tablet 30 tablet 0    Sig: Take 1 tablet (5 mg total) by mouth daily. Please schedule an office visit before anymore refills.     Cardiovascular: Calcium Channel Blockers 2 Failed - 01/02/2022  1:39 PM      Failed - Last Heart Rate in normal range    Pulse Readings from Last 1 Encounters:  12/30/21 (!) 120          Passed - Last BP in normal range    BP Readings from Last 1 Encounters:  12/30/21 119/87          Passed - Valid encounter within last 6 months    Recent Outpatient Visits           3 weeks ago Upper respiratory tract infection, unspecified type   [facility] Vigg, Avanti, MD   4 weeks ago Viral upper respiratory tract infection   [facility] Jon Billings, NP   2 months ago Subacute cough   [facility] Vigg, Avanti, MD   2 months ago Acute bronchitis, unspecified organism   [place] Vigg, Avanti, MD   4 months ago Hyperglycemia   [place] Vigg, Avanti, MD       Future Appointments             In 3 days Vigg, Avanti, MD [facility], [facility]   In 1 week Verlon Au, NP [facility] [place]   In 1 month Stoioff, Ronda Fairly, MD [facility]            Signed Prescriptions Disp Refills   DULoxetine (CYMBALTA) 60 MG capsule 90 capsule 0    Sig: Take 1 capsule (60 mg total) by mouth daily.     Psychiatry: Antidepressants - SNRI - duloxetine Passed - 01/02/2022  1:39 PM      Passed - Cr in normal range and within 360 days    Creat  Date Value Ref Range Status  10/15/2017 1.14 0.70 - 1.33 mg/dL Final     Comment:    For patients >24 years of age, the reference limit for Creatinine is approximately 13% higher for people identified as African-American. .    Creatinine, Ser  Date Value Ref Range Status  12/30/2021 0.91 0.61 - 1.24 mg/dL Final          Passed - eGFR is 30 or above and within 360 days    GFR, Est African American  Date Value Ref Range Status  10/15/2017 83 > OR = 60 mL/min/1.54m2 Final   GFR calc Af Amer  Date Value Ref Range Status  07/25/2020 >60 >60 mL/min Final   GFR, Est Non African American  Date Value Ref Range Status  10/15/2017 72 > OR = 60 mL/min/1.87m2 Final   GFR, Estimated  Date Value Ref Range Status  12/30/2021 >60 >60 mL/min Final    Comment:    (NOTE) Calculated using the CKD-EPI Creatinine Equation (2021)           Passed - Completed PHQ-2 or PHQ-9 in the last 360 days      Passed -  Last BP in normal range    BP Readings from Last 1 Encounters:  12/30/21 119/87          Passed - Valid encounter within last 6 months    Recent Outpatient Visits           3 weeks ago Upper respiratory tract infection, unspecified type   [facility] Vigg, Avanti, MD   4 weeks ago Viral upper respiratory tract infection   [facility] Jon Billings, NP   2 months ago Subacute cough   [facility] Vigg, Avanti, MD   2 months ago Acute bronchitis, unspecified organism   [place] Vigg, Customer service manager, MD   4 months ago Hyperglycemia   [place] Vigg, Customer service manager, MD       Future Appointments             In 3 days Vigg, Avanti, MD [facility], [facility]   In 1 week Verlon Au, NP [facility] [place]   In 1 month Stoioff, Ronda Fairly, MD  [facility]             famotidine (PEPCID) 20 MG tablet 180 tablet 0    Sig: Take 1 tablet (20 mg total) by mouth 2 (two) times daily.     Gastroenterology:  H2 Antagonists Passed - 01/02/2022   1:39 PM      Passed - Valid encounter within last 12 months    Recent Outpatient Visits           3 weeks ago Upper respiratory tract infection, unspecified type   [facility] Vigg, Avanti, MD   4 weeks ago Viral upper respiratory tract infection   [place] [facility] Jon Billings, NP   2 months ago Subacute cough   [facility] Vigg, Avanti, MD   2 months ago Acute bronchitis, unspecified organism   [place] Vigg, Customer service manager, MD   4 months ago Hyperglycemia   [place] Vigg, Customer service manager, MD       Future Appointments             In 3 days Vigg, Avanti, MD [facility], [facility]   In 1 week Verlon Au, NP [facility] [place]   In 1 month Stoioff, Ronda Fairly, MD [facility]             metoprolol succinate (TOPROL-XL) 50 MG 24 hr tablet 90 tablet 0    Sig: Take 2 tablets (100 mg total) by mouth daily. Take with or immediately following a meal.     Cardiovascular:  Beta Blockers Failed - 01/02/2022  1:39 PM      Failed - Last Heart Rate in normal range    Pulse Readings from Last 1 Encounters:  12/30/21 (!) 120          Passed - Last BP in normal range    BP Readings from Last 1 Encounters:  12/30/21 119/87          Passed - Valid encounter within last 6 months    Recent Outpatient Visits           3 weeks ago Upper respiratory tract infection, unspecified type   [facility] Vigg, Avanti, MD   4 weeks ago Viral upper respiratory tract infection   [place], NP   2 months ago Subacute cough   [facility] Vigg, Loman [place], MD  2 months ago Acute bronchitis, unspecified organism   [place] Vigg, Avanti, MD   4 months ago Hyperglycemia   [facility] Vigg, Avanti, MD       Future Appointments             In 3 days Vigg, Avanti, MD [facility], [facility]    In 1 week Verlon Au, NP [facility] [place]   In 1 month Stoioff, Ronda Fairly, MD [place] [facility]             montelukast (SINGULAIR) 10 MG tablet 90 tablet 0    Sig: Take 1 tablet (10 mg total) by mouth at bedtime.     Pulmonology:  Leukotriene Inhibitors Passed - 01/02/2022  1:39 PM      Passed - Valid encounter within last 12 months    Recent Outpatient Visits           3 weeks ago Upper respiratory tract infection, unspecified type   [facility] Vigg, Avanti, MD   4 weeks ago Viral upper respiratory tract infection   [facility] Jon Billings, NP   2 months ago Subacute cough   [facility] Vigg, Avanti, MD   2 months ago Acute bronchitis, unspecified organism   [place] Vigg, Avanti, MD   4 months ago Hyperglycemia   [place], MD       Future Appointments             In 3 days Vigg, Avanti, MD [facility] [place]   In 1 week Verlon Au, NP [facility] [place]   In 1 month [place], Ronda Fairly, MD [place]            cf

## 2022-01-05 ENCOUNTER — Other Ambulatory Visit: Payer: Self-pay

## 2022-01-05 ENCOUNTER — Ambulatory Visit (INDEPENDENT_AMBULATORY_CARE_PROVIDER_SITE_OTHER): Payer: Medicare HMO | Admitting: Internal Medicine

## 2022-01-05 ENCOUNTER — Encounter: Payer: Self-pay | Admitting: Internal Medicine

## 2022-01-05 VITALS — BP 120/90 | HR 90 | Temp 98.9°F | Ht 74.02 in | Wt 230.8 lb

## 2022-01-05 DIAGNOSIS — F1020 Alcohol dependence, uncomplicated: Secondary | ICD-10-CM | POA: Diagnosis not present

## 2022-01-05 DIAGNOSIS — C8590 Non-Hodgkin lymphoma, unspecified, unspecified site: Secondary | ICD-10-CM | POA: Diagnosis not present

## 2022-01-05 DIAGNOSIS — M109 Gout, unspecified: Secondary | ICD-10-CM

## 2022-01-05 DIAGNOSIS — J449 Chronic obstructive pulmonary disease, unspecified: Secondary | ICD-10-CM | POA: Diagnosis not present

## 2022-01-05 DIAGNOSIS — Z720 Tobacco use: Secondary | ICD-10-CM | POA: Insufficient documentation

## 2022-01-05 DIAGNOSIS — F339 Major depressive disorder, recurrent, unspecified: Secondary | ICD-10-CM | POA: Diagnosis not present

## 2022-01-05 DIAGNOSIS — E785 Hyperlipidemia, unspecified: Secondary | ICD-10-CM

## 2022-01-05 NOTE — Progress Notes (Signed)
BP 120/90    Pulse 90    Temp 98.9 F (37.2 C) (Oral)    Ht 6' 2.02" (1.88 m)    Wt 230 lb 12.8 oz (104.7 kg)    SpO2 96%    BMI 29.62 kg/m    Subjective:    Patient ID: Russell Simpson, male    DOB: March 08, 1961, 61 y.o.   MRN: [id number]  Chief Complaint  Patient presents with   URI    Patient states he is feeling much better.    Depression   Anxiety   COPD    HPI: Russell Simpson is a 61 y.o. male  Pt is here for a fu he has a ho NHL, sp chemo finished last course last month, to have a repeat PET ho ETOh and tobacco abuse, continues to drink a 6 pack of beer a day and smokes cigars. Of note he was on welbutrin in th epast unsure if on this currently to clarify with psych sec to mulitple psych meds he is on currently.   Depression        This is a chronic problem.  Associated symptoms include no decreased concentration, no restlessness and no suicidal ideas.  Past medical history includes anxiety.   Anxiety Presents for follow-up visit. Patient reports no compulsions, confusion, decreased concentration, depressed mood, dizziness, dry mouth, feeling of choking, irritability, malaise, muscle tension, nervous/anxious behavior, obsessions, palpitations, panic, restlessness, shortness of breath or suicidal ideas.    COPD There is no chest tightness, difficulty breathing, frequent throat clearing, hoarse voice, shortness of breath or sputum production. His past medical history is significant for COPD.   Chief Complaint  Patient presents with   URI    Patient states he is feeling much better.    Depression   Anxiety   COPD    Relevant past medical, surgical, family and social history reviewed and updated as indicated. Interim medical history since our last visit reviewed. Allergies and medications reviewed and updated.  Review of Systems  Constitutional:  Negative for irritability.  HENT:  Negative for hoarse voice.   Respiratory:  Negative for sputum production and shortness of  breath.   Cardiovascular:  Negative for palpitations.  Neurological:  Negative for dizziness.  Psychiatric/Behavioral:  Positive for depression. Negative for confusion, decreased concentration and suicidal ideas. The patient is not nervous/anxious.    Per HPI unless specifically indicated above     Objective:    BP 120/90    Pulse 90    Temp 98.9 F (37.2 C) (Oral)    Ht 6' 2.02" (1.88 m)    Wt 230 lb 12.8 oz (104.7 kg)    SpO2 96%    BMI 29.62 kg/m   Wt Readings from Last 3 Encounters:  01/05/22 230 lb 12.8 oz (104.7 kg)  12/30/21 228 lb (103.4 kg)  12/26/21 230 lb (104.3 kg)    Physical Exam Vitals and nursing note reviewed.  Constitutional:      General: He is not in acute distress.    Appearance: Normal appearance. He is not ill-appearing or diaphoretic.  HENT:     Head: Normocephalic and atraumatic.     Right Ear: Tympanic membrane and external ear normal. There is no impacted cerumen.     Left Ear: External ear normal.     Nose: No congestion or rhinorrhea.     Mouth/Throat:     Pharynx: No oropharyngeal exudate or posterior oropharyngeal erythema.  Eyes:     Conjunctiva/sclera: Conjunctivae  normal.     Pupils: Pupils are equal, round, and reactive to light.  Cardiovascular:     Rate and Rhythm: Normal rate and regular rhythm.     Heart sounds: No murmur heard.   No friction rub. No gallop.  Pulmonary:     Effort: No respiratory distress.     Breath sounds: No stridor. No wheezing or rhonchi.  Chest:     Chest wall: No tenderness.  Abdominal:     General: Abdomen is flat. Bowel sounds are normal. There is no distension.     Palpations: Abdomen is soft. There is no mass.     Tenderness: There is no abdominal tenderness. There is no right CVA tenderness, left CVA tenderness or rebound.     Hernia: No hernia is present.  Musculoskeletal:        General: No swelling.     Cervical back: Normal range of motion and neck supple. No rigidity or tenderness.     Left  lower leg: No edema.  Skin:    General: Skin is warm and dry.  Neurological:     General: No focal deficit present.     Mental Status: He is alert and oriented to person, place, and time. Mental status is at baseline.     Cranial Nerves: No cranial nerve deficit.  Psychiatric:        Mood and Affect: Mood normal.        Behavior: Behavior normal.    Results for orders placed or performed in visit on 12/30/21  Lactate dehydrogenase  Result Value Ref Range   LDH 191 98 - 192 U/L  Comprehensive metabolic panel  Result Value Ref Range   Sodium 136 135 - 145 mmol/L   Potassium 3.9 3.5 - 5.1 mmol/L   Chloride 99 98 - 111 mmol/L   CO2 25 22 - 32 mmol/L   Glucose, Bld 109 (H) 70 - 99 mg/dL   BUN 6 6 - 20 mg/dL   Creatinine, Ser 0.91 0.61 - 1.24 mg/dL   Calcium 8.8 (L) 8.9 - 10.3 mg/dL   Total Protein 6.3 (L) 6.5 - 8.1 g/dL   Albumin 3.7 3.5 - 5.0 g/dL   AST 40 15 - 41 U/L   ALT 31 0 - 44 U/L   Alkaline Phosphatase 133 (H) 38 - 126 U/L   Total Bilirubin 0.5 0.3 - 1.2 mg/dL   GFR, Estimated >60 >60 mL/min   Anion gap 12 5 - 15  CBC with Differential/Platelet  Result Value Ref Range   WBC 3.3 (L) 4.0 - 10.5 K/uL   RBC 4.41 4.22 - 5.81 MIL/uL   Hemoglobin 14.7 13.0 - 17.0 g/dL   HCT 40.7 39.0 - 52.0 %   MCV 92.3 80.0 - 100.0 fL   MCH 33.3 26.0 - 34.0 pg   MCHC 36.1 (H) 30.0 - 36.0 g/dL   RDW 12.6 11.5 - 15.5 %   Platelets 141 (L) 150 - 400 K/uL   nRBC 0.0 0.0 - 0.2 %   Neutrophils Relative % 26 %   Neutro Abs 0.9 (L) 1.7 - 7.7 K/uL   Lymphocytes Relative 42 %   Lymphs Abs 1.4 0.7 - 4.0 K/uL   Monocytes Relative 22 %   Monocytes Absolute 0.7 0.1 - 1.0 K/uL   Eosinophils Relative 5 %   Eosinophils Absolute 0.2 0.0 - 0.5 K/uL   Basophils Relative 0 %   Basophils Absolute 0.0 0.0 - 0.1 K/uL   Immature Granulocytes 5 %  Abs Immature Granulocytes 0.15 (H) 0.00 - 0.07 K/uL        Current Outpatient Medications:    acetaminophen (TYLENOL) 325 MG tablet, Take 2 tablets (650  mg total) by mouth daily. for 5 days starting 2 days prior to treatment, Disp: 30 tablet, Rfl: 0   albuterol (VENTOLIN HFA) 108 (90 Base) MCG/ACT inhaler, Inhale 2 puffs into the lungs every 6 (six) hours as needed for wheezing or shortness of breath., Disp: 8 g, Rfl: 2   allopurinol (ZYLOPRIM) 300 MG tablet, TAKE 1 TABLET BY MOUTH ONCE DAILY, Disp: 30 tablet, Rfl: 3   atorvastatin (LIPITOR) 40 MG tablet, Take 1 tablet (40 mg total) by mouth daily., Disp: 90 tablet, Rfl: 4   buPROPion (WELLBUTRIN XL) 150 MG 24 hr tablet, Take 1 tablet by mouth every morning., Disp: , Rfl:    clonazePAM (KLONOPIN) 0.5 MG tablet, Take 0.5 mg by mouth daily., Disp: , Rfl:    diltiazem (CARDIZEM CD) 180 MG 24 hr capsule, Take 1 capsule (180 mg total) by mouth daily., Disp: 30 capsule, Rfl: 5   diphenhydrAMINE (BENADRYL) 25 mg capsule, Take 1 capsule (25 mg total) by mouth daily for 1 dose. Take 1 capsule daily for 5 days starting 2 days prior to treatment (Patient taking differently: Take 25 mg by mouth at bedtime as needed.), Disp: 30 capsule, Rfl: 0   DULoxetine (CYMBALTA) 60 MG capsule, Take 1 capsule (60 mg total) by mouth daily., Disp: 90 capsule, Rfl: 0   famotidine (PEPCID) 20 MG tablet, Take 1 tablet (20 mg total) by mouth 2 (two) times daily., Disp: 180 tablet, Rfl: 0   fexofenadine (ALLEGRA ALLERGY) 180 MG tablet, Take 1 tablet (180 mg total) by mouth daily., Disp: 10 tablet, Rfl: 1   finasteride (PROSCAR) 5 MG tablet, TAKE 1 TABLET BY MOUTH ONCE DAILY, Disp: 90 tablet, Rfl: 2   lansoprazole (PREVACID) 30 MG capsule, TAKE 1 CAPSULE BY MOUTH ONCE DAILY AT NOON, Disp: 30 capsule, Rfl: 2   metoprolol succinate (TOPROL-XL) 50 MG 24 hr tablet, Take 2 tablets (100 mg total) by mouth daily. Take with or immediately following a meal., Disp: 90 tablet, Rfl: 0   mirtazapine (REMERON) 15 MG tablet, Take 15 mg by mouth at bedtime., Disp: , Rfl:    montelukast (SINGULAIR) 10 MG tablet, Take 1 tablet (10 mg total) by mouth at  bedtime., Disp: 90 tablet, Rfl: 0   sildenafil (REVATIO) 20 MG tablet, TAKE 3-5 TABLETS BY MOUTH ONCE DAILY AS NEEDED FOR ERECTILE DYSFUNCTION, Disp: 30 tablet, Rfl: 0   testosterone cypionate (DEPOTESTOSTERONE CYPIONATE) 200 MG/ML injection, Inject 1 mL (200 mg total) into the muscle every 14 (fourteen) days., Disp: 10 mL, Rfl: 0   Tiotropium Bromide Monohydrate (SPIRIVA RESPIMAT) 2.5 MCG/ACT AERS, Inhale 2 puffs into the lungs daily., Disp: 1 each, Rfl: 1   diphenoxylate-atropine (LOMOTIL) 2.5-0.025 MG tablet, Take 1 tablet by mouth 4 (four) times daily as needed for diarrhea or loose stools. (Patient not taking: Reported on 12/30/2021), Disp: 30 tablet, Rfl: 0 No current facility-administered medications for this visit.  Facility-Administered Medications Ordered in Other Visits:    heparin lock flush 100 unit/mL, 500 Units, Intravenous, Once, Sindy Guadeloupe, MD   heparin lock flush 100 unit/mL, 500 Units, Intravenous, Once, Sindy Guadeloupe, MD   sodium chloride flush (NS) 0.9 % injection 10 mL, 10 mL, Intravenous, PRN, Sindy Guadeloupe, MD    Assessment & Plan:  Sinus tachycardia stable, new onset sees cardiology for  such  Is on an increased dose of  Diltiazem to 180 MG   NHL : stable,s/p chemo, over a month ago, had a port flushed last Friday  Sees dr. Janese Banks heme onc for such  PET scan x 1 month now. Has neutropenia at 4.   ETOH - 6 pack a day of beer now.  Was doing 18 - 20 beers a day  Elevated alkaline phosphatase.  Sees GI for such in the past. Diffuse increased echogenicity is noted likely related to fatty infiltration. No focal mass is noted. Portal vein is patent on color Doppler imaging with normal direction of blood flow towards the liver   Other: None.  IMPRESSION: Fatty infiltration of the liver.  No other focal abnormality is noted.   GOUT stable on allopurinol for such   Depression is on Cymbalta for such, dad just passed away and GF broke up with him  Feels ok  considering.  Sees psychiarty for such   Tobacco abuse  Smoking cessation : 10 cigars a day  failed nicotine patches in the past. continues to smoke. more than > 5 - 10 mins of time was spent with pt regarding smoking cessation and complications.        Problem List Items Addressed This Visit   None    No orders of the defined types were placed in this encounter.    No orders of the defined types were placed in this encounter.    Follow up plan: No follow-ups on file.

## 2022-01-07 DIAGNOSIS — F17200 Nicotine dependence, unspecified, uncomplicated: Secondary | ICD-10-CM | POA: Diagnosis not present

## 2022-01-07 DIAGNOSIS — F3162 Bipolar disorder, current episode mixed, moderate: Secondary | ICD-10-CM | POA: Diagnosis not present

## 2022-01-07 DIAGNOSIS — F410 Panic disorder [episodic paroxysmal anxiety] without agoraphobia: Secondary | ICD-10-CM | POA: Diagnosis not present

## 2022-01-07 DIAGNOSIS — F1010 Alcohol abuse, uncomplicated: Secondary | ICD-10-CM | POA: Diagnosis not present

## 2022-01-08 ENCOUNTER — Ambulatory Visit: Payer: Medicare HMO

## 2022-01-08 ENCOUNTER — Other Ambulatory Visit: Payer: Self-pay

## 2022-01-08 DIAGNOSIS — L570 Actinic keratosis: Secondary | ICD-10-CM

## 2022-01-08 MED ORDER — AMINOLEVULINIC ACID HCL 20 % EX SOLR
1.0000 "application " | Freq: Once | CUTANEOUS | Status: AC
  Administered 2022-01-08: 354 mg via TOPICAL

## 2022-01-08 NOTE — Progress Notes (Signed)
Patient completed PDT therapy today.  1. AK (actinic keratosis) Head - Anterior (Face)  Photodynamic therapy - Head - Anterior (Face) Procedure discussed: discussed risks, benefits, side effects. and alternatives   Prep: site scrubbed/prepped with acetone   Location:  Face Number of lesions:  Multiple Type of treatment:  Blue light Aminolevulinic Acid (see MAR for details): Levulan Number of Levulan sticks used:  1 Incubation time (minutes):  60 Number of minutes under lamp:  16 Number of seconds under lamp:  40 Cooling:  Floor fan Outcome: patient tolerated procedure well with no complications   Post-procedure details: sunscreen applied     

## 2022-01-08 NOTE — Patient Instructions (Signed)

## 2022-01-09 ENCOUNTER — Inpatient Hospital Stay (HOSPITAL_BASED_OUTPATIENT_CLINIC_OR_DEPARTMENT_OTHER): Payer: Medicare HMO | Admitting: Nurse Practitioner

## 2022-01-09 DIAGNOSIS — Z87891 Personal history of nicotine dependence: Secondary | ICD-10-CM

## 2022-01-13 ENCOUNTER — Inpatient Hospital Stay: Payer: Medicare HMO | Attending: Nurse Practitioner | Admitting: Oncology

## 2022-01-13 ENCOUNTER — Encounter: Payer: Self-pay | Admitting: Oncology

## 2022-01-13 DIAGNOSIS — Z720 Tobacco use: Secondary | ICD-10-CM

## 2022-01-13 NOTE — Progress Notes (Signed)
° °   Attempted to contact but unable to reach.   Will get rescheduled.   Faythe Casa, NP 01/13/2022 3:38 PM

## 2022-01-14 ENCOUNTER — Encounter: Payer: Self-pay | Admitting: Oncology

## 2022-01-14 NOTE — Progress Notes (Signed)
Appt cancelled. Patient will call to reschedule.

## 2022-01-15 NOTE — Telephone Encounter (Signed)
Called patient again and left him a voicemail to call us back. I will also send him a MyChart message and hopefully he will reach out to [place] to schedule a MyChart virtual visit with Dr. Vicente Males.

## 2022-01-16 ENCOUNTER — Telehealth: Payer: Self-pay | Admitting: *Deleted

## 2022-01-16 NOTE — Telephone Encounter (Signed)
Patient returning missed call regarding smoking cessation and asks to be called again

## 2022-01-19 ENCOUNTER — Encounter: Payer: Self-pay | Admitting: Oncology

## 2022-01-23 ENCOUNTER — Telehealth: Payer: Self-pay

## 2022-01-23 NOTE — Telephone Encounter (Signed)
-----   Message from [name], MD sent at 12/30/2021  7:41 PM EST ----- Hi   Looks like he has not taken his meds for a long time - I had only given meds for 9 months - he has likely run out 2 months back   Russell Simpson  Patient needs to come in to see me to discuss this - can see him on Thursday , can be video visit if he prefers.Need to find out why he didn't call for refills- will need labs too   Russell Simpson   Dr [name] MD,MRCP [facility]) Gastroenterology/Hepatology Pager: [phone]    ----- Message ----- From: Sindy Guadeloupe, MD Sent: 12/30/2021   4:47 PM EST To: [name], MD, Charlynne Cousins, MD

## 2022-01-23 NOTE — Telephone Encounter (Signed)
Called patient again and had to leave him a voicemail to call us back. I had also called him at the beginning of February and sent him MyChart message and patient have not reached out to [place] to schedule a follow up appointment with Dr. Vicente Males. I will also send him a letter nd hopefully he will reach out to [place],

## 2022-01-29 ENCOUNTER — Inpatient Hospital Stay: Payer: Medicare HMO | Attending: Oncology | Admitting: Oncology

## 2022-01-29 NOTE — Progress Notes (Signed)
error 

## 2022-02-09 NOTE — Telephone Encounter (Signed)
Patient finally replied back through MyChart and I called him this morning to schedule him an appointment with Dr. Vicente Males. However, he did not answer his phone again. I will try to call him again later on and hopefully we could schedule him an appointment. ?

## 2022-02-10 ENCOUNTER — Inpatient Hospital Stay: Payer: Medicare HMO

## 2022-02-10 NOTE — Telephone Encounter (Signed)
Patient after receiving our letter called [place] back. I told him that Dr. Vicente Males wanted to follow up with him. Therefore, I scheduled him an appointment for 03/05/2022 at 2:15 PM. Patient agreed and had no further questions. ?

## 2022-02-11 ENCOUNTER — Other Ambulatory Visit: Payer: Self-pay

## 2022-02-11 ENCOUNTER — Ambulatory Visit (INDEPENDENT_AMBULATORY_CARE_PROVIDER_SITE_OTHER): Payer: Medicare HMO | Admitting: Dermatology

## 2022-02-11 DIAGNOSIS — L570 Actinic keratosis: Secondary | ICD-10-CM

## 2022-02-11 MED ORDER — AMINOLEVULINIC ACID HCL 20 % EX SOLR
1.0000 "application " | Freq: Once | CUTANEOUS | Status: AC
  Administered 2022-02-11: 354 mg via TOPICAL

## 2022-02-11 NOTE — Progress Notes (Signed)
Patient completed PDT therapy today. ? ?1. AK (actinic keratosis) ?Chest ? ?Photodynamic therapy - Chest ?Procedure discussed: discussed risks, benefits, side effects. and alternatives   ?Prep: site scrubbed/prepped with acetone   ?Location:  Chest ?Number of lesions:  Multiple ?Type of treatment:  Blue light ?Aminolevulinic Acid (see MAR for details): Levulan ?Number of Levulan sticks used:  1 ?Incubation time (minutes):  120 ?Number of minutes under lamp:  16 ?Number of seconds under lamp:  40 ?Cooling:  Floor fan ?Outcome: patient tolerated procedure well with no complications   ?Post-procedure details: sunscreen applied   ? ?Aminolevulinic Acid HCl 20 % SOLR 354 mg - Chest ? ? ?Patient provided samples of Solbar Sunscreen and Vanicream face wash and moisturizer.  ? ?Johnsie Kindred, RMA ? ?Documentation: I have reviewed the above documentation for accuracy and completeness, and I agree with the above. ? ?Brendolyn Patty MD  ?

## 2022-02-11 NOTE — Patient Instructions (Signed)

## 2022-02-19 ENCOUNTER — Other Ambulatory Visit: Payer: Self-pay

## 2022-02-19 DIAGNOSIS — E291 Testicular hypofunction: Secondary | ICD-10-CM

## 2022-02-19 DIAGNOSIS — N5201 Erectile dysfunction due to arterial insufficiency: Secondary | ICD-10-CM

## 2022-02-20 ENCOUNTER — Other Ambulatory Visit: Payer: Self-pay | Admitting: Urology

## 2022-02-20 ENCOUNTER — Other Ambulatory Visit: Payer: Self-pay

## 2022-02-23 ENCOUNTER — Ambulatory Visit: Payer: Self-pay | Admitting: Urology

## 2022-02-25 ENCOUNTER — Encounter: Payer: Self-pay | Admitting: Urology

## 2022-02-25 ENCOUNTER — Ambulatory Visit: Payer: Medicare HMO | Admitting: Urology

## 2022-02-25 VITALS — BP 126/82 | HR 116 | Ht 74.0 in | Wt 233.0 lb

## 2022-02-25 DIAGNOSIS — E291 Testicular hypofunction: Secondary | ICD-10-CM | POA: Diagnosis not present

## 2022-02-25 DIAGNOSIS — N5201 Erectile dysfunction due to arterial insufficiency: Secondary | ICD-10-CM | POA: Diagnosis not present

## 2022-02-25 DIAGNOSIS — N401 Enlarged prostate with lower urinary tract symptoms: Secondary | ICD-10-CM | POA: Diagnosis not present

## 2022-02-25 MED ORDER — FINASTERIDE 5 MG PO TABS: 5.0000 mg | ORAL_TABLET | Freq: Every day | ORAL | 2 refills | Status: DC

## 2022-02-25 MED ORDER — AMBULATORY NON FORMULARY MEDICATION: 0 refills | Status: DC

## 2022-02-25 NOTE — Progress Notes (Signed)
? ?02/25/2022 ?2:33 PM  ? ?Russell Simpson ?[birth year] ?[id number] ? ?Referring provider: Trinna Post, PA-C ?No address on file ? ?Chief Complaint  ?Patient presents with  ? Hypogonadism  ? ? ?Urologic history: ?  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  ?2.  Erectile dysfunction ?-On sildenafil ?  ?3.  BPH with lower urinary tract symptoms ? ? ?HPI: ?Remains on testosterone cypionate and is injecting 200 mg every 2 weeks ?Still complains of significant ED not improved with a PDE 5 inhibitor.  We discussed intracavernosal injections at last years visit ?Stable LUTS on finasteride; he has discontinued tamsulosin ?Denies dysuria, gross hematuria ?No flank, abdominal or pelvic pain ?Annual labs have not yet been drawn ? ? ?PMH: ?Past Medical History:  ?Diagnosis Date  ? 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 Arthritis   ? Asthma   ? Basal cell carcinoma 01/28/2021  ? R upper arm, EDC 03/04/2021  ? Brain tumor (benign) ([place])   ? benign pituitary neoplasm  ? Chronic pain   ? right arm  ? Depression   ? Environmental and seasonal allergies   ? Follicular lymphoma ([place])   ? 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  ? left neck, Moh's 05/29/21  ? Hypertension   ? Pneumonia   ? SCC (squamous cell carcinoma) 08/28/2021  ? upper chest right of midline, EDC done 09/17/2021  ? Sleep apnea   ? does not wear CPAP ; uses humidifier instead  ? Squamous cell carcinoma of skin 02/19/2021  ? L inferior mandible, treated with EDC  ? 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/28/2021  ? R ant shoulder - ED&C  ? 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/28/2021  ? L upper abdomen - ED&C  ? ? ?Surgical History: ?Past Surgical History:  ?Procedure Laterality Date  ? ANTERIOR CERVICAL DECOMP/DISCECTOMY FUSION N/A 06/17/2016  ? Procedure: ANTERIOR CERVICAL DECOMPRESSION FUSION, CERVICAL 3-4, CERVICAL 4-5 WITH INSTRUMENTATION AND ALLOGRAFT;   Surgeon: Phylliss Bob, MD;  Location: [facility];  Service: Orthopedics;  Laterality: N/A;  ANTERIOR CERVICAL DECOMPRESSION FUSION, CERVICAL 3-4, CERVICAL 4-5 WITH INSTRUMENTATION AND ALLOGRAFT  ? BACK SURGERY    ? NECK SURGERY  2009  ? PORTA CATH INSERTION N/A 04/03/2021  ? Procedure: PORTA CATH INSERTION;  Surgeon: Algernon Huxley, MD;  Location: [facility];  Service: Cardiovascular;  Laterality: N/A;  ? ? ?Home Medications:  ?Allergies as of 02/25/2022   ? ?   Reactions  ? Penicillins Anaphylaxis  ? Tolerates cefdinir, cephalexin and amoxicillin/clavulonate so true PCN allergy unlikely  ? Tetanus Toxoids Swelling  ? Lisinopril Cough  ? Losartan   ? Other reaction(s): Muscle Pain  ? Tetanus Toxoid   ? Codeine Nausea Only, Nausea And Vomiting  ? Doxycycline Rash  ? Ruxience [rituximab-pvvr] Rash  ? 05/06/21 pt w/ worsening rash during Ruxience infusion.  Face, neck and chest flushing redness  ? ?  ? ?  ?Medication List  ?  ? ?  ? Accurate as of February 25, 2022  2:33 PM. If you have any questions, ask your nurse or doctor.  ?  ?  ? ?  ? ?acetaminophen 325 MG tablet ?Commonly known as: Tylenol ?Take 2 tablets (650 mg total) by mouth daily. for 5 days starting 2 days prior to treatment ?  ?albuterol 108 (90 Base) MCG/ACT inhaler ?Commonly known as: VENTOLIN HFA ?Inhale 2 puffs into the lungs every 6 (  six) hours as needed for wheezing or shortness of breath. ?  ?allopurinol 300 MG tablet ?Commonly known as: ZYLOPRIM ?TAKE 1 TABLET BY MOUTH ONCE DAILY ?  ?AMBULATORY NON FORMULARY MEDICATION ?Trimix (30/1/10)-(Pap/Phent/PGE) ? ?Test Dose  26m vial  ? ?Qty #3 Refills 0 ? ?[facility] ?[phone]?Fax [phone]?Started by: SAbbie Sons MD ?  ?atorvastatin 40 MG tablet ?Commonly known as: LIPITOR ?Take 1 tablet (40 mg total) by mouth daily. ?  ?buPROPion 150 MG 24 hr tablet ?Commonly known as: WELLBUTRIN XL ?Take 1 tablet by mouth every morning. ?  ?clonazePAM 0.5 MG tablet ?Commonly known as:  KLONOPIN ?Take 0.5 mg by mouth daily. ?  ?diltiazem 180 MG 24 hr capsule ?Commonly known as: CARDIZEM CD ?Take 1 capsule (180 mg total) by mouth daily. ?  ?DULoxetine 60 MG capsule ?Commonly known as: CYMBALTA ?Take 1 capsule (60 mg total) by mouth daily. ?  ?famotidine 20 MG tablet ?Commonly known as: PEPCID ?Take 1 tablet (20 mg total) by mouth 2 (two) times daily. ?  ?fexofenadine 180 MG tablet ?Commonly known as: Allegra Allergy ?Take 1 tablet (180 mg total) by mouth daily. ?  ?finasteride 5 MG tablet ?Commonly known as: PROSCAR ?Take 1 tablet (5 mg total) by mouth daily. ?  ?lansoprazole 30 MG capsule ?Commonly known as: PREVACID ?TAKE 1 CAPSULE BY MOUTH ONCE DAILY AT NOON ?  ?metoprolol succinate 50 MG 24 hr tablet ?Commonly known as: TOPROL-XL ?Take 2 tablets (100 mg total) by mouth daily. Take with or immediately following a meal. ?  ?mirtazapine 15 MG tablet ?Commonly known as: REMERON ?Take 15 mg by mouth at bedtime. ?  ?montelukast 10 MG tablet ?Commonly known as: SINGULAIR ?Take 1 tablet (10 mg total) by mouth at bedtime. ?  ?sildenafil 20 MG tablet ?Commonly known as: REVATIO ?TAKE 3-5 TABLETS BY MOUTH ONCE DAILY AS NEEDED FOR ERECTILE DYSFUNCTION ?  ?Spiriva Respimat 2.5 MCG/ACT Aers ?Generic drug: Tiotropium Bromide Monohydrate ?Inhale 2 puffs into the lungs daily. ?  ?testosterone cypionate 200 MG/ML injection ?Commonly known as: DEPOTESTOSTERONE CYPIONATE ?Inject 1 mL (200 mg total) into the muscle every 14 (fourteen) days. ?  ? ?  ? ? ?Allergies:  ?Allergies  ?Allergen Reactions  ? Penicillins Anaphylaxis  ?  Tolerates cefdinir, cephalexin and amoxicillin/clavulonate so true PCN allergy unlikely  ? Tetanus Toxoids Swelling  ? Lisinopril Cough  ? Losartan   ?   ?Other reaction(s): Muscle Pain ? ?  ? Tetanus Toxoid   ? Codeine Nausea Only and Nausea And Vomiting  ? Doxycycline Rash  ? Ruxience [Rituximab-Pvvr] Rash  ?  05/06/21 pt w/ worsening rash during Ruxience infusion.  Face, neck and chest  flushing redness  ? ? ?Family History: ?Family History  ?Problem Relation Age of Onset  ? Diabetes Mother   ? Heart attack Father   ? Emphysema Father   ? Diabetes Other   ? ? ?Social History:  reports that he has been smoking cigars. He has never used smokeless tobacco. He reports current alcohol use. He reports that he does not use drugs. ? ? ?Physical Exam: ?BP 126/82   Pulse (!) 116   Ht '6\' 2"'$  (1.88 m)   Wt 233 lb (105.7 kg)   BMI 29.92 kg/m?   ?Constitutional:  Alert and oriented, No acute distress. ?HEENT: [place] AT, moist mucus membranes.  Trachea midline, no masses. ?Cardiovascular: No clubbing, cyanosis, or edema. ?Respiratory: Normal respiratory effort, no increased work of breathing. ?GU: Prostate 40 g,  smooth without nodules ?Neurologic: Grossly intact, no focal deficits, moving all 4 extremities. ?Psychiatric: Normal mood and affect. ? ? ?Assessment & Plan:   ? ?1.  Hypogonadism ?Stable on TRT ?75-monthfollow-up testosterone, hematocrit ?1 year office visit testosterone, hematocrit, DRE ? ?2.  Erectile dysfunction ?He would like to schedule an intracavernosal injection PA training visit ? ?3.  BPH with LUTS ?Stable ?Finasteride refilled ? ? ?SAbbie Sons MD ? ?[facility]?[address] ?[place] [address]?[phone]? ?

## 2022-02-26 ENCOUNTER — Encounter: Payer: Self-pay | Admitting: Urology

## 2022-02-26 LAB — HEMATOCRIT: Hematocrit: 44.9 % (ref 37.5–51.0)

## 2022-02-26 LAB — PSA: Prostate Specific Ag, Serum: 1.8 ng/mL (ref 0.0–4.0)

## 2022-02-26 LAB — TESTOSTERONE: Testosterone: 180 ng/dL — ABNORMAL LOW (ref 264–916)

## 2022-03-02 ENCOUNTER — Encounter: Payer: Self-pay | Admitting: *Deleted

## 2022-03-05 ENCOUNTER — Encounter: Payer: Self-pay | Admitting: Gastroenterology

## 2022-03-05 ENCOUNTER — Other Ambulatory Visit: Payer: Self-pay

## 2022-03-05 ENCOUNTER — Ambulatory Visit: Payer: Medicare HMO | Admitting: Gastroenterology

## 2022-03-05 VITALS — BP 131/95 | HR 120 | Temp 98.7°F | Wt 230.8 lb

## 2022-03-05 DIAGNOSIS — R768 Other specified abnormal immunological findings in serum: Secondary | ICD-10-CM | POA: Diagnosis not present

## 2022-03-05 DIAGNOSIS — Z7960 Long term (current) use of unspecified immunomodulators and immunosuppressants: Secondary | ICD-10-CM | POA: Diagnosis not present

## 2022-03-05 MED ORDER — TENOFOVIR DISOPROXIL FUMARATE 300 MG PO TABS: 300.0000 mg | ORAL_TABLET | Freq: Every day | ORAL | 3 refills | Status: AC

## 2022-03-05 NOTE — Progress Notes (Signed)
?  ?Russell Bellows MD, MRCP(U.K) ?[address]  ?[address]  ?[place], [place] [address]  ?Main: [phone]  ?Fax: [phone] ? ? ?Primary Care Physician: Charlynne Cousins, MD ? ?Primary Gastroenterologist:  Dr. Jonathon Simpson  ? ?Chief Complaint  ?Patient presents with  ? Hepatitis B  ? ? ?HPI: Russell Simpson is a 61 y.o. male ? ?Initially referred and seen on 03/31/2021 for a positive hepatitis B core antibody in the setting of lymphoma requiring Rituxan.  On 03/10/2021 was noted to have positive hepatitis B core antibody with surface antigen negative and surface antibody strongly positive.CT abdomen on 02/26/2021 showed no evidence of portal hypertension ?  ?02/27/2021 platelet count is 91 ?  ?He denies any prior tattoos, military service, blood transfusions, illegal drug use.  He does collect receiving vaccination while at school from the vaccination gun.  ?  ?  ?Interval history 07/01/2021-03/05/2022 ?He follows with oncology for follicular lymphoma.  He is on Rituxan.  Significant improvement with therapy.  Had been drinking alcohol trying to cut.  He had been out of his tenofovir.  Not followed up with [place].  We had offered a virtual visit in February did not hear back from him. ? ?02/25/2022 hemoglobin normal. ?12/30/2021 CMP shows normal transaminases and total bilirubin. ? ?Unclear if he has continued uninterrupted his tenofovir I see that at some point it was discontinued by various doctors.  When I asked him if he was taking the medication he said yes but he could not name the medication he was taking nor did it sound familiar when I mentioned to him the medication name.  He states he is still on Rituxan.  Drinking some alcohol but trying to stop. ? ? ?Current Outpatient Medications  ?Medication Sig Dispense Refill  ? acetaminophen (TYLENOL) 325 MG tablet Take 2 tablets (650 mg total) by mouth daily. for 5 days starting 2 days prior to treatment 30 tablet 0  ? albuterol (VENTOLIN HFA) 108 (90 Base) MCG/ACT inhaler Inhale  2 puffs into the lungs every 6 (six) hours as needed for wheezing or shortness of breath. 8 g 2  ? allopurinol (ZYLOPRIM) 300 MG tablet TAKE 1 TABLET BY MOUTH ONCE DAILY 30 tablet 3  ? AMBULATORY NON FORMULARY MEDICATION Trimix (30/1/10)-(Pap/Phent/PGE) ? ?Test Dose  52m vial  ? ?Qty #3 Refills 0 ? ?[facility] ?[phone]?Fax [phone] mL 0  ? atorvastatin (LIPITOR) 40 MG tablet Take 1 tablet (40 mg total) by mouth daily. 90 tablet 4  ? buPROPion (WELLBUTRIN XL) 150 MG 24 hr tablet Take 1 tablet by mouth every morning.    ? clonazePAM (KLONOPIN) 0.5 MG tablet Take 0.5 mg by mouth daily.    ? desvenlafaxine (PRISTIQ) 25 MG 24 hr tablet Take 25 mg by mouth daily.    ? diltiazem (CARDIZEM CD) 180 MG 24 hr capsule Take 1 capsule (180 mg total) by mouth daily. 30 capsule 5  ? DULoxetine (CYMBALTA) 60 MG capsule Take 1 capsule (60 mg total) by mouth daily. 90 capsule 0  ? famotidine (PEPCID) 20 MG tablet Take 1 tablet (20 mg total) by mouth 2 (two) times daily. 180 tablet 0  ? fexofenadine (ALLEGRA ALLERGY) 180 MG tablet Take 1 tablet (180 mg total) by mouth daily. 10 tablet 1  ? finasteride (PROSCAR) 5 MG tablet Take 1 tablet (5 mg total) by mouth daily. 90 tablet 2  ? lansoprazole (PREVACID) 30 MG capsule TAKE 1 CAPSULE BY MOUTH ONCE DAILY AT NOON 30  capsule 2  ? metoprolol succinate (TOPROL-XL) 50 MG 24 hr tablet Take 2 tablets (100 mg total) by mouth daily. Take with or immediately following a meal. 90 tablet 0  ? mirtazapine (REMERON) 15 MG tablet Take 15 mg by mouth at bedtime.    ? montelukast (SINGULAIR) 10 MG tablet Take 1 tablet (10 mg total) by mouth at bedtime. 90 tablet 0  ? OLANZapine (ZYPREXA) 5 MG tablet Take 5 mg by mouth at bedtime.    ? sildenafil (REVATIO) 20 MG tablet TAKE 3-5 TABLETS BY MOUTH ONCE DAILY AS NEEDED FOR ERECTILE DYSFUNCTION 30 tablet 0  ? testosterone cypionate (DEPOTESTOSTERONE CYPIONATE) 200 MG/ML injection Inject 1 mL (200 mg total) into the muscle every 14  (fourteen) days. 10 mL 0  ? Tiotropium Bromide Monohydrate (SPIRIVA RESPIMAT) 2.5 MCG/ACT AERS Inhale 2 puffs into the lungs daily. 1 each 1  ? tenofovir (VIREAD) 300 MG tablet Take 1 tablet (300 mg total) by mouth daily. 90 tablet 3  ? ?No current facility-administered medications for this visit.  ? ?Facility-Administered Medications Ordered in Other Visits  ?Medication Dose Route Frequency Provider Last Rate Last Admin  ? heparin lock flush 100 unit/mL  500 Units Intravenous Once Sindy Guadeloupe, MD      ? heparin lock flush 100 unit/mL  500 Units Intravenous Once Sindy Guadeloupe, MD      ? sodium chloride flush (NS) 0.9 % injection 10 mL  10 mL Intravenous PRN Sindy Guadeloupe, MD      ? ? ?Allergies as of 03/05/2022 - Review Complete 03/05/2022  ?Allergen Reaction Noted  ? Penicillins Anaphylaxis 09/03/2015  ? Tetanus toxoids Swelling 09/03/2015  ? Lisinopril Cough 10/15/2015  ? Losartan  10/15/2015  ? Tetanus toxoid    ? Codeine Nausea Only and Nausea And Vomiting 12/05/2020  ? Doxycycline Rash 01/07/2019  ? Ruxience [rituximab-pvvr] Rash 05/08/2021  ? ? ?ROS: ? ?General: Negative for anorexia, weight loss, fever, chills, fatigue, weakness. ?ENT: Negative for hoarseness, difficulty swallowing , nasal congestion. ?CV: Negative for chest pain, angina, palpitations, dyspnea on exertion, peripheral edema.  ?Respiratory: Negative for dyspnea at rest, dyspnea on exertion, cough, sputum, wheezing.  ?GI: See history of present illness. ?GU:  Negative for dysuria, hematuria, urinary incontinence, urinary frequency, nocturnal urination.  ?Endo: Negative for unusual weight change.  ?  ?Physical Examination: ? ? BP (!) 131/95   Pulse (!) 120   Temp 98.7 ?F (37.1 ?C) (Oral)   Wt 230 lb 12.8 oz (104.7 kg)   BMI 29.63 kg/m?  ? ?General: Well-nourished, well-developed in no acute distress.  ?Eyes: No icterus. Conjunctivae pink. ?Neuro: Alert and oriented x 3.  Grossly intact. ?Skin: Warm and dry, no jaundice.   ?Psych: Alert  and cooperative, normal mood and affect. ? ? ?Imaging Studies: ?No results found. ? ?Assessment and Plan:  ? ?Russell Simpson is a 61 y.o. y/o male  here to follow-up for positive hepatitis B core antibody with a negative surface antigen in the setting of treatment of a lymphoma with Rituxan.  Commenced on tenofovir prior to commencing his chemotherapy.  Unclear if he has been taking it.  He said he had been taking it but could not recollect what exactly he was taking ?  ?Plan ?1.  Check hepatitis B viral load. ?2.  Continue on tenofovir for for  at least 12 months after stopping anti-CD20 agents since there is a lag in the recovery of B cell function among such patients.  I  will give him a refill of his tenofovir.  He has been clearly instructed that this medication should not be stopped until I suggested him to do so.  Cessation of this medication while on Rituxan can cause him to develop acute flare of hepatitis B and potentially even liver failure ? ? ?Dr Russell Bellows  MD,MRCP [facility]) ?Follow up in 4 months   ?

## 2022-03-07 LAB — HEPATITIS B DNA, ULTRAQUANTITATIVE, PCR: HBV DNA SERPL PCR-ACNC: NOT DETECTED IU/mL

## 2022-03-17 NOTE — Progress Notes (Signed)
? ? ?03/18/2022 ?9:43 AM  ? ?Russell Simpson ?August 31, 1961 ?[id number] ? ?Referring provider: Charlynne Cousins, MD ?[address] [place],  [place] [address] ? ?Chief Complaint  ?Patient presents with  ? Erectile Dysfunction  ? ?Urological history: ?1.  Hypogonadotrophic hypogonadism ?-Contributing factors of a prolactin secreting pituitary tumor and age ?-Testosterone level 180 on February 25, 2022 ?-Hematocrit 44.9% on February 25, 2022 ?-Managed with testosterone cypionate 200 mg/milliliters, 1 cc every 14 days ?-next appointment is scheduled for September 2023 ? ?2.  Erectile dysfunction ?-Contributing factors of hypogonadism, BPH, smoking, sleep apnea, depression, hypertension and COPD and alcohol consumption ?-failed PDE5i's ? ?3. BPH with LU TS ?-PSA 1.8 ?-Managed with finasteride 5 mg daily ? ?HPI: ?Russell Simpson is a 61 y.o. male who presents today for ICI titration after failing PDE 5 inhibitors. ? ?He states he gets no response when he takes PDE 5 inhibitors.  He is no longer having spontaneous erections.  He denies any pain or curvature with previous erections. ? ?He also has questions regarding wave therapy.   ? ?PMH: ?Past Medical History:  ?Diagnosis Date  ? 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 Arthritis   ? Asthma   ? Basal cell carcinoma 01/28/2021  ? R upper arm, EDC 03/04/2021  ? Brain tumor (benign) ([place])   ? benign pituitary neoplasm  ? Chronic pain   ? right arm  ? Depression   ? Environmental and seasonal allergies   ? Follicular lymphoma ([place])   ? 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  ? left neck, Moh's 05/29/21  ? Hypertension   ? Pneumonia   ? SCC (squamous cell carcinoma) 08/28/2021  ? upper chest right of midline, EDC done 09/17/2021  ? Sleep apnea   ? does not wear CPAP ; uses humidifier instead  ? Squamous cell carcinoma of skin 02/19/2021  ? L inferior mandible, treated with EDC  ? 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/28/2021  ? R ant shoulder - ED&C  ? Squamous cell carcinoma of skin  08/28/2021  ? L upper abdomen - ED&C  ? ? ?Surgical History: ?Past Surgical History:  ?Procedure Laterality Date  ? ANTERIOR CERVICAL DECOMP/DISCECTOMY FUSION N/A 06/17/2016  ? Procedure: ANTERIOR CERVICAL DECOMPRESSION FUSION, CERVICAL 3-4, CERVICAL 4-5 WITH INSTRUMENTATION AND ALLOGRAFT;  Surgeon: Phylliss Bob, MD;  Location: [facility];  Service: Orthopedics;  Laterality: N/A;  ANTERIOR CERVICAL DECOMPRESSION FUSION, CERVICAL 3-4, CERVICAL 4-5 WITH INSTRUMENTATION AND ALLOGRAFT  ? BACK SURGERY    ? NECK SURGERY  2009  ? PORTA CATH INSERTION N/A 04/03/2021  ? Procedure: PORTA CATH INSERTION;  Surgeon: Algernon Huxley, MD;  Location: [facility];  Service: Cardiovascular;  Laterality: N/A;  ? ? ?Home Medications:  ?Allergies as of 03/18/2022   ? ?   Reactions  ? Penicillins Anaphylaxis  ? Tolerates cefdinir, cephalexin and amoxicillin/clavulonate so true PCN allergy unlikely  ? Tetanus Toxoids Swelling  ? Lisinopril Cough  ? Losartan   ? Other reaction(s): Muscle Pain  ? Tetanus Toxoid   ? Codeine Nausea Only, Nausea And Vomiting  ? Doxycycline Rash  ? Ruxience [rituximab-pvvr] Rash  ? 05/06/21 pt w/ worsening rash during Ruxience infusion.  Face, neck and chest flushing redness  ? ?  ? ?  ?Medication List  ?  ? ?  ? Accurate as of March 18, 2022  9:43 AM. If you have any questions, ask your nurse or doctor.  ?  ?  ? ?  ? ?  acetaminophen 325 MG tablet ?Commonly known as: Tylenol ?Take 2 tablets (650 mg total) by mouth daily. for 5 days starting 2 days prior to treatment ?  ?albuterol 108 (90 Base) MCG/ACT inhaler ?Commonly known as: VENTOLIN HFA ?Inhale 2 puffs into the lungs every 6 (six) hours as needed for wheezing or shortness of breath. ?  ?allopurinol 300 MG tablet ?Commonly known as: ZYLOPRIM ?TAKE 1 TABLET BY MOUTH ONCE DAILY ?  ?AMBULATORY NON FORMULARY MEDICATION ?Trimix (30/1/10)-(Pap/Phent/PGE) ? ?Test Dose  18m vial  ? ?Qty #3 Refills 0 ? ?[facility] ?[phone]?Fax [phone]?   ?atorvastatin 40 MG tablet ?Commonly known as: LIPITOR ?Take 1 tablet (40 mg total) by mouth daily. ?  ?buPROPion 150 MG 24 hr tablet ?Commonly known as: WELLBUTRIN XL ?Take 1 tablet by mouth every morning. ?  ?clonazePAM 0.5 MG tablet ?Commonly known as: KLONOPIN ?Take 0.5 mg by mouth daily. ?  ?desvenlafaxine 25 MG 24 hr tablet ?Commonly known as: PRISTIQ ?Take 25 mg by mouth daily. ?  ?diltiazem 180 MG 24 hr capsule ?Commonly known as: CARDIZEM CD ?Take 1 capsule (180 mg total) by mouth daily. ?  ?DULoxetine 60 MG capsule ?Commonly known as: CYMBALTA ?Take 1 capsule (60 mg total) by mouth daily. ?  ?famotidine 20 MG tablet ?Commonly known as: PEPCID ?Take 1 tablet (20 mg total) by mouth 2 (two) times daily. ?  ?fexofenadine 180 MG tablet ?Commonly known as: Allegra Allergy ?Take 1 tablet (180 mg total) by mouth daily. ?  ?finasteride 5 MG tablet ?Commonly known as: PROSCAR ?Take 1 tablet (5 mg total) by mouth daily. ?  ?lansoprazole 30 MG capsule ?Commonly known as: PREVACID ?TAKE 1 CAPSULE BY MOUTH ONCE DAILY AT NOON ?  ?metoprolol succinate 50 MG 24 hr tablet ?Commonly known as: TOPROL-XL ?Take 2 tablets (100 mg total) by mouth daily. Take with or immediately following a meal. ?  ?mirtazapine 15 MG tablet ?Commonly known as: REMERON ?Take 15 mg by mouth at bedtime. ?  ?montelukast 10 MG tablet ?Commonly known as: SINGULAIR ?Take 1 tablet (10 mg total) by mouth at bedtime. ?  ?OLANZapine 5 MG tablet ?Commonly known as: ZYPREXA ?Take 5 mg by mouth at bedtime. ?  ?sildenafil 20 MG tablet ?Commonly known as: REVATIO ?TAKE 3-5 TABLETS BY MOUTH ONCE DAILY AS NEEDED FOR ERECTILE DYSFUNCTION ?  ?Spiriva Respimat 2.5 MCG/ACT Aers ?Generic drug: Tiotropium Bromide Monohydrate ?Inhale 2 puffs into the lungs daily. ?  ?tenofovir 300 MG tablet ?Commonly known as: VIREAD ?Take 1 tablet (300 mg total) by mouth daily. ?  ?testosterone cypionate 200 MG/ML injection ?Commonly known as: DEPOTESTOSTERONE CYPIONATE ?Inject 1 mL  (200 mg total) into the muscle every 14 (fourteen) days. ?  ? ?  ? ? ?Allergies:  ?Allergies  ?Allergen Reactions  ? Penicillins Anaphylaxis  ?  Tolerates cefdinir, cephalexin and amoxicillin/clavulonate so true PCN allergy unlikely  ? Tetanus Toxoids Swelling  ? Lisinopril Cough  ? Losartan   ?   ?Other reaction(s): Muscle Pain ? ?  ? Tetanus Toxoid   ? Codeine Nausea Only and Nausea And Vomiting  ? Doxycycline Rash  ? Ruxience [Rituximab-Pvvr] Rash  ?  05/06/21 pt w/ worsening rash during Ruxience infusion.  Face, neck and chest flushing redness  ? ? ?Family History: ?Family History  ?Problem Relation Age of Onset  ? Diabetes Mother   ? Heart attack Father   ? Emphysema Father   ? Diabetes Other   ? ? ?Social History:  reports  that he has been smoking cigars. He has never used smokeless tobacco. He reports current alcohol use. He reports that he does not use drugs. ? ?ROS: ?Pertinent ROS in HPI ? ?Physical Exam: ?BP 107/73   Pulse 91   Ht '6\' 2"'$  (1.88 m)   Wt 238 lb (108 kg)   BMI 30.56 kg/m?   ?Constitutional:  Well nourished. Alert and oriented, No acute distress. ?HEENT: [place] AT, moist mucus membranes.  Trachea midline, no masses. ?Cardiovascular: No clubbing, cyanosis, or edema. ?Respiratory: Normal respiratory effort, no increased work of breathing. ?GU: No CVA tenderness.  No bladder fullness or masses.  Patient with circumcised phallus.  Urethral meatus is patent.  No penile discharge. No penile lesions or rashes. Scrotum without lesions, cysts, rashes and/or edema.   ?Neurologic: Grossly intact, no focal deficits, moving all 4 extremities. ?Psychiatric: Normal mood and affect. ? ?Laboratory Data: ?Lab Results  ?Component Value Date  ? WBC 3.3 (L) 12/30/2021  ? HGB 14.7 12/30/2021  ? HCT 44.9 02/25/2022  ? MCV 92.3 12/30/2021  ? PLT 141 (L) 12/30/2021  ? ? ?Lab Results  ?Component Value Date  ? CREATININE 0.91 12/30/2021  ? ? ?Lab Results  ?Component Value Date  ? TESTOSTERONE 180 (L) 02/25/2022  ? ? ?Lab  Results  ?Component Value Date  ? TSH 4.070 08/15/2021  ? ? ?Lab Results  ?Component Value Date  ? AST 40 12/30/2021  ? ?Lab Results  ?Component Value Date  ? ALT 31 12/30/2021  ? ?Urinalysis ?   ?Component Value Date/Time  ? CO

## 2022-03-18 ENCOUNTER — Other Ambulatory Visit: Payer: Self-pay | Admitting: Internal Medicine

## 2022-03-18 ENCOUNTER — Other Ambulatory Visit: Payer: Self-pay | Admitting: Urology

## 2022-03-18 ENCOUNTER — Encounter: Payer: Self-pay | Admitting: Urology

## 2022-03-18 ENCOUNTER — Ambulatory Visit: Payer: Medicare HMO | Admitting: Urology

## 2022-03-18 VITALS — BP 107/73 | HR 91 | Ht 74.0 in | Wt 238.0 lb

## 2022-03-18 DIAGNOSIS — N401 Enlarged prostate with lower urinary tract symptoms: Secondary | ICD-10-CM | POA: Diagnosis not present

## 2022-03-18 DIAGNOSIS — E291 Testicular hypofunction: Secondary | ICD-10-CM | POA: Diagnosis not present

## 2022-03-18 DIAGNOSIS — N5201 Erectile dysfunction due to arterial insufficiency: Secondary | ICD-10-CM | POA: Diagnosis not present

## 2022-03-18 NOTE — Patient Instructions (Addendum)
TRIMIX SELF-INJECTION INSTRUCTIONS  ?  ?DETAILED PROCEDURE  ?1. GETTING SET UP  ?A. Proper hygiene is important. Wash your hands and keep the penis clean.  ?B. Assemble the following:  ?- Bottle of Trimix  ?- Alcohol pad  ?- Syringe  ?C. Keep the Trimix cold by returning the bottle to the refrigerator, or by placing the bottle in a cup of ice.  ? ?2. PREPARE THE SYRINGE  ?A. Wipe the rubber top of the vial with an alcohol pad.  ?B. After removing the cap of the needle, pull the plunger back to the desired dosage, filling this volume with air. Use a new needle and syringe each time.  ?C. Insert the needle through the rubber top and inject the air into the vial.  ?D. Turn the vial with needle and syringe inserted upside down. Pull back on the syringe plunger in a slow and steady motion until the desired dosage is achieved. (Start with 5 units) ?E. Tap the side of the syringe (1cc tuberculin syringe with a 29 gauge needle) to allow any air bubbles to float towards the needle. Avoid having these air bubbles in the syringe when self-injecting by first injecting out the collected bubbles that may form.  ?F. Remove the needle from the bottle and replace the protective cap on the needle. ? ?  ?3. SELECT AND PREPARE THE SITE FOR INJECTION  ?A. The proper location for injection is at the 9-11 and 1-3 o'clock positions, between the base and mid-portion of the penis.(see diagram) Avoid the midline because of potential for injury to the urethra (6 o'clock; for urinary passage) and the penile arteries and nerves (near 12 o'clock). Avoid any visible veins or arteries on the surface.  ?B. Grasp and pull the head of the penis toward the side of your leg with the index finger and thumb (use the left hand, if right handed). While maintaining light tension, select a site for injection.  ?C. Clean the site with an alcohol pad.  ? ?4: INJECT TRIMIX AND APPLY COMPRESSION  ?A. With a steady and continuous motion, penetrate the skin with  the needle at a 90 o angle. The needle should then be advanced to the hub. Slight resistance is encountered as the needle passes into the proper position within the erectile tissue (corporeal body).  ?B. Inject the Trimix over approximately 4 seconds. Withdraw the needle from the penis and apply compression to the injection site for approximately 1 minute. Several minutes of compression may be required to avoid bleeding, especially if you are an aspirin user.  ?C. Replace the cap on the needle and dispose of properly.  ? ? ? ? ?Wave therapy ?[facility] ?[phone] ?

## 2022-03-18 NOTE — Telephone Encounter (Signed)
Requested Prescriptions  ?Pending Prescriptions Disp Refills  ?? [place] 2.5 MCG/ACT AERS [Pharmacy Med Name: SPIRIVA RESPIMAT 2.5 MCG/ACT INH AE] 4 g 1  ?  Sig: INHALE 2 PUFFS INTO THE LUNGS ONCE DAILY. *RINSE MOUTH AFTER USE*  ?  ? Pulmonology:  Anticholinergic Agents Passed - 03/18/2022  9:44 AM  ?  ?  Passed - Valid encounter within last 12 months  ?  Recent Outpatient Visits   ?      ? 2 months ago Alcohol use disorder, severe, dependence ([place])  ? [facility] Vigg, Avanti, MD  ? 3 months ago Upper respiratory tract infection, unspecified type  ? [facility] Vigg, Avanti, MD  ? 3 months ago Viral upper respiratory tract infection  ? [facility], NP  ? 4 months ago Subacute cough  ? [facility] Vigg, Avanti, MD  ? 4 months ago Acute bronchitis, unspecified organism  ? [facility] Vigg, Avanti, MD  ?  ?  ?Future Appointments   ?        ? In 2 weeks Vigg, Avanti, MD [facility]  ? In 4 months [name], MD [facility]  ?  ? ?  ?  ?  ? ?

## 2022-03-23 ENCOUNTER — Encounter: Payer: Self-pay | Admitting: Internal Medicine

## 2022-03-23 ENCOUNTER — Ambulatory Visit
Admission: RE | Admit: 2022-03-23 | Discharge: 2022-03-23 | Disposition: A | Discharge status: Home / Self Care | Payer: Medicare HMO | Source: Ambulatory Visit | Attending: Oncology | Admitting: Oncology

## 2022-03-23 DIAGNOSIS — Z08 Encounter for follow-up examination after completed treatment for malignant neoplasm: Secondary | ICD-10-CM | POA: Insufficient documentation

## 2022-03-23 DIAGNOSIS — Z8572 Personal history of non-Hodgkin lymphomas: Secondary | ICD-10-CM | POA: Diagnosis not present

## 2022-03-23 DIAGNOSIS — K760 Fatty (change of) liver, not elsewhere classified: Secondary | ICD-10-CM | POA: Diagnosis not present

## 2022-03-23 DIAGNOSIS — S2231XA Fracture of one rib, right side, initial encounter for closed fracture: Secondary | ICD-10-CM | POA: Diagnosis not present

## 2022-03-23 LAB — POCT I-STAT CREATININE: Creatinine, Ser: 1.1 mg/dL (ref 0.61–1.24)

## 2022-03-23 MED ORDER — IOHEXOL 300 MG/ML  SOLN
100.0000 mL | Freq: Once | INTRAMUSCULAR | Status: AC | PRN
Start: 2022-03-23 — End: 2022-03-23
  Administered 2022-03-23: 100 mL via INTRAVENOUS

## 2022-03-24 ENCOUNTER — Encounter: Payer: Self-pay | Admitting: Oncology

## 2022-03-24 NOTE — Telephone Encounter (Signed)
He needs to wait until 5/3

## 2022-03-31 DIAGNOSIS — N529 Male erectile dysfunction, unspecified: Secondary | ICD-10-CM | POA: Diagnosis not present

## 2022-03-31 DIAGNOSIS — E221 Hyperprolactinemia: Secondary | ICD-10-CM | POA: Diagnosis not present

## 2022-04-01 ENCOUNTER — Inpatient Hospital Stay: Payer: Medicare HMO | Attending: Oncology

## 2022-04-01 ENCOUNTER — Inpatient Hospital Stay (HOSPITAL_BASED_OUTPATIENT_CLINIC_OR_DEPARTMENT_OTHER): Payer: Medicare HMO | Admitting: Oncology

## 2022-04-01 ENCOUNTER — Encounter: Payer: Self-pay | Admitting: Oncology

## 2022-04-01 VITALS — BP 128/89 | HR 87 | Temp 96.7°F | Resp 18 | Wt 232.2 lb

## 2022-04-01 DIAGNOSIS — F1010 Alcohol abuse, uncomplicated: Secondary | ICD-10-CM | POA: Diagnosis not present

## 2022-04-01 DIAGNOSIS — D702 Other drug-induced agranulocytosis: Secondary | ICD-10-CM | POA: Diagnosis not present

## 2022-04-01 DIAGNOSIS — Z9221 Personal history of antineoplastic chemotherapy: Secondary | ICD-10-CM | POA: Diagnosis not present

## 2022-04-01 DIAGNOSIS — Z08 Encounter for follow-up examination after completed treatment for malignant neoplasm: Secondary | ICD-10-CM | POA: Diagnosis not present

## 2022-04-01 DIAGNOSIS — R768 Other specified abnormal immunological findings in serum: Secondary | ICD-10-CM

## 2022-04-01 DIAGNOSIS — C8200 Follicular lymphoma grade I, unspecified site: Secondary | ICD-10-CM | POA: Diagnosis not present

## 2022-04-01 DIAGNOSIS — F3162 Bipolar disorder, current episode mixed, moderate: Secondary | ICD-10-CM | POA: Diagnosis not present

## 2022-04-01 DIAGNOSIS — Z8572 Personal history of non-Hodgkin lymphomas: Secondary | ICD-10-CM | POA: Diagnosis not present

## 2022-04-01 DIAGNOSIS — R918 Other nonspecific abnormal finding of lung field: Secondary | ICD-10-CM | POA: Diagnosis not present

## 2022-04-01 DIAGNOSIS — F410 Panic disorder [episodic paroxysmal anxiety] without agoraphobia: Secondary | ICD-10-CM | POA: Diagnosis not present

## 2022-04-01 DIAGNOSIS — C8202 Follicular lymphoma grade I, intrathoracic lymph nodes: Secondary | ICD-10-CM

## 2022-04-01 LAB — COMPREHENSIVE METABOLIC PANEL
ALT: 39 U/L (ref 0–44)
AST: 38 U/L (ref 15–41)
Albumin: 4.1 g/dL (ref 3.5–5.0)
Alkaline Phosphatase: 159 U/L — ABNORMAL HIGH (ref 38–126)
Anion gap: 10 (ref 5–15)
BUN: 13 mg/dL (ref 6–20)
CO2: 25 mmol/L (ref 22–32)
Calcium: 9 mg/dL (ref 8.9–10.3)
Chloride: 96 mmol/L — ABNORMAL LOW (ref 98–111)
Creatinine, Ser: 1.09 mg/dL (ref 0.61–1.24)
GFR, Estimated: >60 mL/min (ref >60)
Glucose, Bld: 118 mg/dL — ABNORMAL HIGH (ref 70–99)
Potassium: 3.9 mmol/L (ref 3.5–5.1)
Sodium: 131 mmol/L — ABNORMAL LOW (ref 135–145)
Total Bilirubin: 1 mg/dL (ref 0.3–1.2)
Total Protein: 6.8 g/dL (ref 6.5–8.1)

## 2022-04-01 LAB — CBC WITH DIFFERENTIAL/PLATELET
Abs Immature Granulocytes: 0.11 10*3/uL — ABNORMAL HIGH (ref 0.00–0.07)
Basophils Absolute: 0 10*3/uL (ref 0.0–0.1)
Basophils Relative: 1 %
Eosinophils Absolute: 0.1 10*3/uL (ref 0.0–0.5)
Eosinophils Relative: 4 %
HCT: 47.5 % (ref 39.0–52.0)
Hemoglobin: 17 g/dL (ref 13.0–17.0)
Immature Granulocytes: 3 %
Lymphocytes Relative: 49 %
Lymphs Abs: 1.9 10*3/uL (ref 0.7–4.0)
MCH: 33.6 pg (ref 26.0–34.0)
MCHC: 35.8 g/dL (ref 30.0–36.0)
MCV: 93.9 fL (ref 80.0–100.0)
Monocytes Absolute: 0.8 10*3/uL (ref 0.1–1.0)
Monocytes Relative: 20 %
Neutro Abs: 0.9 10*3/uL — ABNORMAL LOW (ref 1.7–7.7)
Neutrophils Relative %: 23 %
Platelets: 152 10*3/uL (ref 150–400)
RBC: 5.06 MIL/uL (ref 4.22–5.81)
RDW: 12.8 % (ref 11.5–15.5)
WBC: 3.8 10*3/uL — ABNORMAL LOW (ref 4.0–10.5)
nRBC: 0 % (ref 0.0–0.2)

## 2022-04-01 LAB — LACTATE DEHYDROGENASE: LDH: 200 U/L — ABNORMAL HIGH (ref 98–192)

## 2022-04-01 MED ORDER — HEPARIN SOD (PORK) LOCK FLUSH 100 UNIT/ML IV SOLN
500.0000 [IU] | Freq: Once | INTRAVENOUS | Status: AC
  Administered 2022-04-01: 500 [IU] via INTRAVENOUS
  Filled 2022-04-01: qty 5

## 2022-04-01 MED ORDER — SODIUM CHLORIDE 0.9% FLUSH
10.0000 mL | Freq: Once | INTRAVENOUS | Status: AC
  Administered 2022-04-01: 10 mL via INTRAVENOUS
  Filled 2022-04-01: qty 10

## 2022-04-05 ENCOUNTER — Encounter: Payer: Self-pay | Admitting: Oncology

## 2022-04-05 NOTE — Progress Notes (Signed)
? ? ? ?Hematology/Oncology Consult note ?[place]  ?[phone] [phone] ? ?Patient Care Team: ?Charlynne Cousins, MD as PCP - General (Internal Medicine) ?Kate Sable, MD as PCP - Cardiology (Cardiology) ?Sindy Guadeloupe, MD as Consulting Physician (Oncology)  ? ?Name of the patient: Russell Simpson  ?[id number]  ?14-Oct-1961  ? ?Date of visit: 04/05/22 ? 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  ? ?Chief complaint/ Reason for visit-routine follow-up of follicular lymphoma ? ?Heme/Onc history: patient is a 61 year old male with a past medical history significant for hypertension, hyperlipidemia, hypogonadism, pituitary adenoma who recently had a fall on in December 2020. [name]  He sustained a left anterior frontal sinus as well as supraorbital rim fracture on 11/21/2019 which was managed conservatively.  He then presented to the ER at [facility] with symptoms of right-sided chest wall pain this led to a CT PE which did not show any evidence of pulmonary embolism.  He was noted to have bilateral symmetric axillary adenopathy measuring up to 1.8 cm.  Scattered mediastinal adenopathy.  Right paratracheal node measuring up to 1.2 cm.  Prominent right costophrenic lymph node measuring up to 1 cm.  Findings are nonspecific but could be seen with lymphoma versus systemic inflammatory disease such as sarcoidosis.  Of note patient has had a prior CT scan for lung screening back in 2015 when he was not noted to have this adenopathy.  ?  ?Repeat CT chest in August 2021 showed persistent mild nonbulky axillary and mediastinal adenopathy which had not changed significantly as compared to his prior CT in December 2020.  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 ?  ?Repeat CT in March 2022 showed Progression and multistation adenopathy as well as significant splenomegaly measuring 20.4 cm as compared to 14.5 cm on CT  scan September 2021.  CT scan showed multistation adenopathy with SUVs ranging between 6-8.9.  Patient underwent another core biopsy of right inguinal lymph node which was consistent with follicular lymphoma grade 1-2.  Bone marrow biopsy also showed moderate involvement with non-Hodgkin's B-cell lymphoma.  Baseline hepatitis B testing showed core antibody positivity.  Seen by GI and started on tenofovir ?  ?Patient had symptoms of nausea and sweating during cycle 1 of Rituxan which resolved with additional premedications.  He developed symptoms of itching over neck chest back and bilateral eyes with second dose of Rituxan early and had to get more premedications and was not rechallenged on the same day.  Patient subsequently received cycle 3 of Bendamustine Rituxan chemotherapy after Rituxan was given as a split dose over 3 days ?  ?Scans after 4 cycles of Bendamustine Rituxan chemotherapy showedSignificant improvement with therapy.  Lymphadenopathy in his neck and mediastinum resolved Deauville 2.  Decrease in the splenic size and hypermetabolism.  No new lesions.  FDG accumulation within the skeleton presumably due to stimulatory effects of treatment. ?  ?  ? ?Interval history-reports that he is currently taking tenofovir for hepatitis prophylaxis.  He continues to drink daily about 3-4 beers per day.  He is still smoking but trying to quit and cut down. ? ?ECOG PS- 1 ?Pain scale- 0 ? ? ?Review of systems- Review of Systems  ?Constitutional:  Positive for malaise/fatigue. Negative for chills, fever and weight loss.  ?HENT:  Negative for congestion, ear discharge and nosebleeds.   ?Eyes:  Negative for blurred vision.  ?Respiratory:  Negative for cough, hemoptysis, sputum production,  shortness of breath and wheezing.   ?Cardiovascular:  Negative for chest pain, palpitations, orthopnea and claudication.  ?Gastrointestinal:  Negative for abdominal pain, blood in stool, constipation, diarrhea, heartburn, melena,  nausea and vomiting.  ?Genitourinary:  Negative for dysuria, flank pain, frequency, hematuria and urgency.  ?Musculoskeletal:  Negative for back pain, joint pain and myalgias.  ?Skin:  Negative for rash.  ?Neurological:  Negative for dizziness, tingling, focal weakness, seizures, weakness and headaches.  ?Endo/Heme/Allergies:  Does not bruise/bleed easily.  ?Psychiatric/Behavioral:  Negative for depression and suicidal ideas. The patient does not have insomnia.    ? ? ? ?Allergies  ?Allergen Reactions  ? Penicillins Anaphylaxis  ?  Tolerates cefdinir, cephalexin and amoxicillin/clavulonate so true PCN allergy unlikely  ? Tetanus Toxoids Swelling  ? Lisinopril Cough  ? Losartan   ?   ?Other reaction(s): Muscle Pain ? ?  ? Tetanus Toxoid   ? Codeine Nausea Only and Nausea And Vomiting  ? Doxycycline Rash  ? Ruxience [Rituximab-Pvvr] Rash  ?  05/06/21 pt w/ worsening rash during Ruxience infusion.  Face, neck and chest flushing redness  ? ? ? ?Past Medical History:  ?Diagnosis Date  ? 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 Arthritis   ? Asthma   ? Basal cell carcinoma 01/28/2021  ? R upper arm, EDC 03/04/2021  ? Brain tumor (benign) ([place])   ? benign pituitary neoplasm  ? Chronic pain   ? right arm  ? Depression   ? Environmental and seasonal allergies   ? Follicular lymphoma ([place])   ? 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  ? left neck, Moh's 05/29/21  ? Hypertension   ? Pneumonia   ? SCC (squamous cell carcinoma) 08/28/2021  ? upper chest right of midline, EDC done 09/17/2021  ? Sleep apnea   ? does not wear CPAP ; uses humidifier instead  ? Squamous cell carcinoma of skin 02/19/2021  ? L inferior mandible, treated with EDC  ? 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/28/2021  ? R ant shoulder - ED&C  ? 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/28/2021  ? L upper abdomen - ED&C  ? ? ? ?Past Surgical History:  ?Procedure Laterality Date  ? ANTERIOR CERVICAL DECOMP/DISCECTOMY FUSION N/A 06/17/2016  ? Procedure: ANTERIOR  CERVICAL DECOMPRESSION FUSION, CERVICAL 3-4, CERVICAL 4-5 WITH INSTRUMENTATION AND ALLOGRAFT;  Surgeon: Phylliss Bob, MD;  Location: [facility];  Service: Orthopedics;  Laterality: N/A;  ANTERIOR CERVICAL DECOMPRESSION FUSION, CERVICAL 3-4, CERVICAL 4-5 WITH INSTRUMENTATION AND ALLOGRAFT  ? BACK SURGERY    ? NECK SURGERY  2009  ? PORTA CATH INSERTION N/A 04/03/2021  ? Procedure: PORTA CATH INSERTION;  Surgeon: Algernon Huxley, MD;  Location: [facility];  Service: Cardiovascular;  Laterality: N/A;  ? ? ?Social History  ? ?Socioeconomic History  ? Marital status: Divorced  ?  Spouse name: Not on file  ? Number of children: Not on file  ? Years of education: Not on file  ? Highest education level: Not on file  ?Occupational History  ? Not on file  ?Tobacco Use  ? Smoking status: Every Day  ?  Types: Cigars  ? Smokeless tobacco: Never  ? Tobacco comments:  ?  8 a day  ?Vaping Use  ? Vaping Use: Former  ? Start date: 04/30/2017  ? Quit date: 11/30/2017  ?Substance and Sexual Activity  ? Alcohol use: Yes  ?  Comment: beers 12 a day  ? Drug use: No  ?  Sexual activity: Not Currently  ?Other Topics Concern  ? Not on file  ?Social History Narrative  ? Not on file  ? ?Social Determinants of Health  ? ?Financial Resource Strain: High Risk  ? Difficulty of Paying Living Expenses: Hard  ?Food Insecurity: Food Insecurity Present  ? Worried About Charity fundraiser in the Last Year: Often true  ? Ran Out of Food in the Last Year: Often true  ?Transportation Needs: No Transportation Needs  ? Lack of Transportation (Medical): No  ? Lack of Transportation (Non-Medical): No  ?Physical Activity: Inactive  ? Days of Exercise per Week: 0 days  ? Minutes of Exercise per Session: 0 min  ?Stress: No Stress Concern Present  ? Feeling of Stress : Only a little  ?Social Connections: Socially Isolated  ? Frequency of Communication with Friends and Family: More than three times a week  ? Frequency of Social Gatherings with Friends and Family: More  than three times a week  ? Attends Religious Services: Never  ? Active Member of Clubs or Organizations: No  ? Attends Archivist Meetings: Never  ? Marital Status: Divorced  ?Intimate Partner Vi

## 2022-04-06 ENCOUNTER — Ambulatory Visit: Payer: Medicare HMO | Admitting: Internal Medicine

## 2022-04-07 ENCOUNTER — Encounter: Payer: Self-pay | Admitting: Internal Medicine

## 2022-04-07 ENCOUNTER — Ambulatory Visit (INDEPENDENT_AMBULATORY_CARE_PROVIDER_SITE_OTHER): Payer: Medicare HMO | Admitting: Internal Medicine

## 2022-04-07 VITALS — BP 105/76 | HR 80 | Temp 97.9°F | Ht 74.02 in | Wt 233.2 lb

## 2022-04-07 DIAGNOSIS — F329 Major depressive disorder, single episode, unspecified: Secondary | ICD-10-CM

## 2022-04-07 DIAGNOSIS — C8590 Non-Hodgkin lymphoma, unspecified, unspecified site: Secondary | ICD-10-CM

## 2022-04-07 DIAGNOSIS — E785 Hyperlipidemia, unspecified: Secondary | ICD-10-CM

## 2022-04-07 LAB — URINALYSIS, ROUTINE W REFLEX MICROSCOPIC
Bilirubin, UA: NEGATIVE
Glucose, UA: NEGATIVE
Leukocytes,UA: NEGATIVE
Nitrite, UA: NEGATIVE
Protein,UA: NEGATIVE
RBC, UA: NEGATIVE
Specific Gravity, UA: 1.015 (ref 1.005–1.030)
Urobilinogen, Ur: 0.2 mg/dL (ref 0.2–1.0)
pH, UA: 6 (ref 5.0–7.5)

## 2022-04-07 MED ORDER — BUPROPION HCL ER (XL) 300 MG PO TB24: 300.0000 mg | ORAL_TABLET | Freq: Every day | ORAL | 4 refills | Status: DC

## 2022-04-07 NOTE — Progress Notes (Signed)
BP 105/76   Pulse 80   Temp 97.9 F (36.6 C) (Oral)   Ht 6' 2.02" (1.88 m)   Wt 233 lb 3.2 oz (105.8 kg)   SpO2 97%   BMI 29.93 kg/m    Subjective:    Patient ID: Russell Simpson, male    DOB: 16-Dec-1960, 61 y.o.   MRN: [id number]  Chief Complaint  Patient presents with  . Hyperglycemia  . alcohol use disorder    HPI: Russell Simpson is a 61 y.o. male  Hyperglycemia  Depression        This is a chronic problem. Nicotine Dependence Presents for follow-up visit. His urge triggers include company of smokers. The symptoms have been improving.   Chief Complaint  Patient presents with  . Hyperglycemia  . alcohol use disorder    Relevant past medical, surgical, family and social history reviewed and updated as indicated. Interim medical history since our last visit reviewed. Allergies and medications reviewed and updated.  Review of Systems  Psychiatric/Behavioral:  Positive for depression.    Per HPI unless specifically indicated above     Objective:    BP 105/76   Pulse 80   Temp 97.9 F (36.6 C) (Oral)   Ht 6' 2.02" (1.88 m)   Wt 233 lb 3.2 oz (105.8 kg)   SpO2 97%   BMI 29.93 kg/m   Wt Readings from Last 3 Encounters:  04/07/22 233 lb 3.2 oz (105.8 kg)  04/01/22 232 lb 3.2 oz (105.3 kg)  03/18/22 238 lb (108 kg)    Physical Exam Vitals and nursing note reviewed.  Constitutional:      General: He is not in acute distress.    Appearance: Normal appearance. He is not ill-appearing or diaphoretic.  HENT:     Head: Normocephalic and atraumatic.     Right Ear: Tympanic membrane and external ear normal. There is no impacted cerumen.     Left Ear: External ear normal.     Nose: No congestion or rhinorrhea.     Mouth/Throat:     Pharynx: No oropharyngeal exudate or posterior oropharyngeal erythema.  Eyes:     Conjunctiva/sclera: Conjunctivae normal.     Pupils: Pupils are equal, round, and reactive to light.  Cardiovascular:     Rate and Rhythm: Normal  rate and regular rhythm.     Heart sounds: No murmur heard.    No friction rub. No gallop.  Pulmonary:     Effort: No respiratory distress.     Breath sounds: No stridor. No wheezing or rhonchi.  Chest:     Chest wall: No tenderness.  Abdominal:     General: Abdomen is flat. Bowel sounds are normal.     Palpations: Abdomen is soft. There is no mass.     Tenderness: There is no abdominal tenderness.  Musculoskeletal:        General: No swelling, tenderness or deformity.     Cervical back: Normal range of motion and neck supple. No rigidity or tenderness.     Left lower leg: No edema.  Skin:    General: Skin is warm.  Neurological:     Mental Status: He is alert.     Cranial Nerves: No cranial nerve deficit.     Sensory: No sensory deficit.     Motor: No weakness.     Coordination: Coordination normal.     Gait: Gait normal.     Deep Tendon Reflexes: Reflexes normal.  Psychiatric:  Mood and Affect: Mood normal.        Behavior: Behavior normal.        Thought Content: Thought content normal.        Judgment: Judgment normal.    Results for orders placed or performed in visit on 04/07/22  Urinalysis, Routine w reflex microscopic  Result Value Ref Range   Specific Gravity, UA 1.015 1.005 - 1.030   pH, UA 6.0 5.0 - 7.5   Color, UA Yellow Yellow   Appearance Ur Clear Clear   Leukocytes,UA Negative Negative   Protein,UA Negative Negative/Trace   Glucose, UA Negative Negative   Ketones, UA Trace (A) Negative   RBC, UA Negative Negative   Bilirubin, UA Negative Negative   Urobilinogen, Ur 0.2 0.2 - 1.0 mg/dL   Nitrite, UA Negative Negative  Lipid panel  Result Value Ref Range   Cholesterol, Total 142 100 - 199 mg/dL   Triglycerides 101 0 - 149 mg/dL   HDL 35 (L) >39 mg/dL   VLDL Cholesterol Cal 19 5 - 40 mg/dL   LDL Chol Calc (NIH) 88 0 - 99 mg/dL   Chol/HDL Ratio 4.1 0.0 - 5.0 ratio        Current Outpatient Medications:  .  acetaminophen (TYLENOL) 325 MG  tablet, Take 2 tablets (650 mg total) by mouth daily. for 5 days starting 2 days prior to treatment, Disp: 30 tablet, Rfl: 0 .  albuterol (VENTOLIN HFA) 108 (90 Base) MCG/ACT inhaler, Inhale 2 puffs into the lungs every 6 (six) hours as needed for wheezing or shortness of breath., Disp: 8 g, Rfl: 2 .  allopurinol (ZYLOPRIM) 300 MG tablet, TAKE 1 TABLET BY MOUTH ONCE DAILY, Disp: 30 tablet, Rfl: 3 .  atorvastatin (LIPITOR) 40 MG tablet, Take 1 tablet (40 mg total) by mouth daily., Disp: 90 tablet, Rfl: 4 .  celecoxib (CELEBREX) 200 MG capsule, every 12 (twelve) hours, Disp: , Rfl:  .  clonazePAM (KLONOPIN) 0.5 MG tablet, Take 0.5 mg by mouth daily., Disp: , Rfl:  .  desvenlafaxine (PRISTIQ) 25 MG 24 hr tablet, Take 25 mg by mouth daily., Disp: , Rfl:  .  diltiazem (CARDIZEM CD) 180 MG 24 hr capsule, Take 1 capsule (180 mg total) by mouth daily., Disp: 30 capsule, Rfl: 5 .  famotidine (PEPCID) 20 MG tablet, Take 1 tablet (20 mg total) by mouth 2 (two) times daily., Disp: 180 tablet, Rfl: 0 .  fexofenadine (ALLEGRA ALLERGY) 180 MG tablet, Take 1 tablet (180 mg total) by mouth daily., Disp: 10 tablet, Rfl: 1 .  finasteride (PROSCAR) 5 MG tablet, Take 1 tablet (5 mg total) by mouth daily., Disp: 90 tablet, Rfl: 2 .  lansoprazole (PREVACID) 30 MG capsule, TAKE 1 CAPSULE BY MOUTH ONCE DAILY AT NOON, Disp: 30 capsule, Rfl: 2 .  montelukast (SINGULAIR) 10 MG tablet, Take 1 tablet (10 mg total) by mouth at bedtime., Disp: 90 tablet, Rfl: 0 .  OLANZapine (ZYPREXA) 5 MG tablet, Take 5 mg by mouth at bedtime., Disp: , Rfl:  .  sildenafil (REVATIO) 20 MG tablet, TAKE 3-5 TABLETS BY MOUTH ONCE DAILY AS NEEDED FOR ERECTILE DYSFUNCTION, Disp: 30 tablet, Rfl: 0 .  SPIRIVA RESPIMAT 2.5 MCG/ACT AERS, INHALE 2 PUFFS INTO THE LUNGS ONCE DAILY. *RINSE MOUTH AFTER USE*, Disp: 4 g, Rfl: 1 .  tenofovir (VIREAD) 300 MG tablet, Take 1 tablet (300 mg total) by mouth daily., Disp: 90 tablet, Rfl: 3 .  testosterone cypionate  (DEPOTESTOSTERONE CYPIONATE) 200 MG/ML injection, Inject  1 mL (200 mg total) into the muscle every 14 (fourteen) days., Disp: 10 mL, Rfl: 0 .  AMBULATORY NON FORMULARY MEDICATION, Trimix (30/1/10)-(Pap/Phent/PGE)  Test Dose  16m vial   [address] [place] [phone] [phone](Patient not taking: Reported on 04/01/2022), Disp: 3 mL, Rfl: 0 .  buPROPion (WELLBUTRIN XL) 300 MG 24 hr tablet, Take 1 tablet (300 mg total) by mouth daily., Disp: 30 tablet, Rfl: 4 .  metoprolol succinate (TOPROL-XL) 50 MG 24 hr tablet, Take 2 tablets (100 mg total) by mouth daily. Take with or immediately following a meal., Disp: 90 tablet, Rfl: 0 No current facility-administered medications for this visit.  Facility-Administered Medications Ordered in Other Visits:  .  heparin lock flush 100 unit/mL, 500 Units, Intravenous, Once, RRanda EvensC, MD .  heparin lock flush 100 unit/mL, 500 Units, Intravenous, Once, RSindy Guadeloupe MD .  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 Recurrence  Lung nodules increasing in size  To fu with pulmonology reg such  ED sees urology for such  Has had injection  Fu with urology .  Smoking cessation sec to ling nodules to d/w psych  Increase welbutrin xl 300 mg.  Smoking cessation advised. pt refuses chantix. failed nicotine patches in the past. continues to smoke. more than > 5 - 10 mins of time was spent with pt regarding smoking cessation and complications.   Depression: increase Wellbutrin on pristiq and cymbalta - stop this.  Is on remeron for such ? Still taking, is on multiple meds to d/w psych  Pt to call back office and inform u[place]orea[place] meds he is currently taking.     Problem List Items Addressed This Visit       Other   Hyperlipidemia - Primary   Relevant Orders   Lipid panel (Completed)    Urinalysis, Routine w reflex microscopic (Completed)   Depression   Relevant Medications   buPROPion (WELLBUTRIN XL) 300 MG 24 hr tablet   Lymphoma ([place]     Orders Placed This Encounter  Procedures  . Lipid panel  . Urinalysis, Routine w reflex microscopic     Meds ordered this encounter  Medications  . buPROPion (WELLBUTRIN XL) 300 MG 24 hr tablet    Sig: Take 1 tablet (300 mg total) by mouth daily.    Dispense:  30 tablet    Refill:  4     Follow up plan: Return in about 3 months (around 07/08/2022).

## 2022-04-08 LAB — LIPID PANEL
Chol/HDL Ratio: 4.1 ratio (ref 0.0–5.0)
Cholesterol, Total: 142 mg/dL (ref 100–199)
HDL: 35 mg/dL — ABNORMAL LOW (ref >39)
LDL Chol Calc (NIH): 88 mg/dL (ref 0–99)
Triglycerides: 101 mg/dL (ref 0–149)
VLDL Cholesterol Cal: 19 mg/dL (ref 5–40)

## 2022-04-13 ENCOUNTER — Other Ambulatory Visit: Payer: Self-pay

## 2022-04-13 MED ORDER — DILTIAZEM HCL ER COATED BEADS 180 MG PO CP24: 180.0000 mg | ORAL_CAPSULE | Freq: Every day | ORAL | 0 refills | Status: DC

## 2022-05-01 DIAGNOSIS — F410 Panic disorder [episodic paroxysmal anxiety] without agoraphobia: Secondary | ICD-10-CM | POA: Diagnosis not present

## 2022-05-01 DIAGNOSIS — F3162 Bipolar disorder, current episode mixed, moderate: Secondary | ICD-10-CM | POA: Diagnosis not present

## 2022-05-01 DIAGNOSIS — F1010 Alcohol abuse, uncomplicated: Secondary | ICD-10-CM | POA: Diagnosis not present

## 2022-05-07 ENCOUNTER — Ambulatory Visit: Payer: Self-pay

## 2022-05-07 DIAGNOSIS — M79662 Pain in left lower leg: Secondary | ICD-10-CM | POA: Diagnosis not present

## 2022-05-07 DIAGNOSIS — S82832A Other fracture of upper and lower end of left fibula, initial encounter for closed fracture: Secondary | ICD-10-CM | POA: Diagnosis not present

## 2022-05-07 NOTE — Telephone Encounter (Signed)
  Chief Complaint: severe pain above left ankle Symptoms: severe pain, hurts to bear  weight or move ankle, pt could not sleep because of the pain Frequency: since last night Pertinent Negatives: Patient denies swelling Disposition: '[]'$ ED /'[x]'$ Urgent Care (no appt availability in office) / '[]'$ Appointment(In office/virtual)/ '[]'$  [facility]/ '[]'$ Home Care/ '[]'$ Refused Recommended Disposition /'[]'$ [facility]/ '[]'$  Follow-up with PCP Additional Notes: pt agreeable to go to UC     Reason for Disposition  [1] SEVERE pain (e.g., excruciating, unable to do any normal activities) AND [2] not improved after 2 hours of pain medicine  Answer Assessment - Initial Assessment Questions 1. ONSET: "When did the pain start?"      yesterday 2. LOCATION: "Where is the pain located?"      Above ankle left 3. PAIN: "How bad is the pain?"    (Scale 1-10; or mild, moderate, severe)   -  MILD (1-3): doesn't interfere with normal activities    -  MODERATE (4-7): interferes with normal activities (e.g., work or school) or awakens from sleep, limping    -  SEVERE (8-10): excruciating pain, unable to do any normal activities, unable to walk     severe 4. WORK OR EXERCISE: "Has there been any recent work or exercise that involved this part of the body?"      Tripped and fell yesterday 5. CAUSE: "What do you think is causing the leg pain?"     fall 6. OTHER SYMPTOMS: "Do you have any other symptoms?" (e.g., chest pain, back pain, breathing difficulty, swelling, rash, fever, numbness, weakness)     no 7. PREGNANCY: "Is there any chance you are pregnant?" "When was your last menstrual period?"     N/a  Protocols used: Leg Pain-A-AH

## 2022-05-12 ENCOUNTER — Ambulatory Visit: Payer: Medicare HMO | Admitting: Pulmonary Disease

## 2022-05-13 DIAGNOSIS — S82422A Displaced transverse fracture of shaft of left fibula, initial encounter for closed fracture: Secondary | ICD-10-CM | POA: Diagnosis not present

## 2022-05-21 ENCOUNTER — Encounter: Payer: Medicare HMO | Admitting: Dermatology

## 2022-05-22 ENCOUNTER — Other Ambulatory Visit: Payer: Self-pay | Admitting: Internal Medicine

## 2022-05-22 DIAGNOSIS — R059 Cough, unspecified: Secondary | ICD-10-CM

## 2022-05-22 DIAGNOSIS — F3160 Bipolar disorder, current episode mixed, unspecified: Secondary | ICD-10-CM

## 2022-05-22 DIAGNOSIS — F410 Panic disorder [episodic paroxysmal anxiety] without agoraphobia: Secondary | ICD-10-CM

## 2022-05-22 DIAGNOSIS — F4310 Post-traumatic stress disorder, unspecified: Secondary | ICD-10-CM

## 2022-05-22 DIAGNOSIS — K219 Gastro-esophageal reflux disease without esophagitis: Secondary | ICD-10-CM

## 2022-05-27 ENCOUNTER — Ambulatory Visit (INDEPENDENT_AMBULATORY_CARE_PROVIDER_SITE_OTHER): Payer: Medicare HMO | Admitting: Dermatology

## 2022-05-27 DIAGNOSIS — Z85828 Personal history of other malignant neoplasm of skin: Secondary | ICD-10-CM | POA: Diagnosis not present

## 2022-05-27 DIAGNOSIS — D045 Carcinoma in situ of skin of trunk: Secondary | ICD-10-CM | POA: Diagnosis not present

## 2022-05-27 DIAGNOSIS — L578 Other skin changes due to chronic exposure to nonionizing radiation: Secondary | ICD-10-CM

## 2022-05-27 DIAGNOSIS — L821 Other seborrheic keratosis: Secondary | ICD-10-CM

## 2022-05-27 DIAGNOSIS — D1800 Hemangioma unspecified site: Secondary | ICD-10-CM

## 2022-05-27 DIAGNOSIS — T148XXA Other injury of unspecified body region, initial encounter: Secondary | ICD-10-CM

## 2022-05-27 DIAGNOSIS — L814 Other melanin hyperpigmentation: Secondary | ICD-10-CM

## 2022-05-27 DIAGNOSIS — D099 Carcinoma in situ, unspecified: Secondary | ICD-10-CM

## 2022-05-27 DIAGNOSIS — L570 Actinic keratosis: Secondary | ICD-10-CM | POA: Diagnosis not present

## 2022-05-27 DIAGNOSIS — D229 Melanocytic nevi, unspecified: Secondary | ICD-10-CM

## 2022-05-27 DIAGNOSIS — D489 Neoplasm of uncertain behavior, unspecified: Secondary | ICD-10-CM

## 2022-05-27 DIAGNOSIS — Z1283 Encounter for screening for malignant neoplasm of skin: Secondary | ICD-10-CM

## 2022-05-27 HISTORY — DX: Carcinoma in situ, unspecified: D09.9

## 2022-05-27 MED ORDER — FLUOROURACIL 5 % EX CREA: TOPICAL_CREAM | Freq: Two times a day (BID) | CUTANEOUS | 1 refills | Status: DC

## 2022-05-27 MED ORDER — MUPIROCIN 2 % EX OINT: 1.0000 | TOPICAL_OINTMENT | Freq: Every day | CUTANEOUS | 3 refills | Status: DC

## 2022-05-27 MED ORDER — CALCIPOTRIENE 0.005 % EX CREA: TOPICAL_CREAM | Freq: Two times a day (BID) | CUTANEOUS | 1 refills | Status: DC

## 2022-05-27 NOTE — Patient Instructions (Addendum)
Start 5-fluorouracil cream followed by calcipotriene cream twice a day to affected areas. Treat the following areas one area at a time. After finishing treatment with these two creams, apply vaseline 2-3 times per day to affected areas while they are healing.  Chest and front of shoulders: apply twice a day for 7 days  Face (avoiding eyes, mouth, and creases around nose): apply twice a day for 4 days  Repeat treatment for chest once the chest is completely healed  Right arm and back of right hand: apply twice a day for 7 days  Left arm and back of left hand: apply twice a day for 7 days  DO NOT TREAT MULTIPLE AREAS AT ONCE  Prescription sent to [facility]. Patient provided with contact information for pharmacy and advised the pharmacy will mail the prescription to their home. Patient provided with handout reviewing treatment course and side effects and advised to call or message [place] on MyChart with any concerns.    5-Fluorouracil/Calcipotriene Patient Education   Actinic keratoses are the dry, red scaly spots on the skin caused by sun damage. A portion of these spots can turn into skin cancer with time, and treating them can help prevent development of skin cancer.   Treatment of these spots requires removal of the defective skin cells. There are various ways to remove actinic keratoses, including freezing with liquid nitrogen, treatment with creams, or treatment with a blue light procedure in the office.   5-fluorouracil cream is a topical cream used to treat actinic keratoses. It works by interfering with the growth of abnormal fast-growing skin cells, such as actinic keratoses. These cells peel off and are replaced by healthy ones.   5-fluorouracil/calcipotriene is a combination of the 5-fluorouracil cream with a vitamin D analog cream called calcipotriene. The calcipotriene alone does not treat actinic keratoses. However, when it is combined with 5-fluorouracil, it helps the  5-fluorouracil treat the actinic keratoses much faster so that the same results can be achieved with a much shorter treatment time.  INSTRUCTIONS FOR 5-FLUOROURACIL/CALCIPOTRIENE CREAM:   5-fluorouracil/calcipotriene cream typically only needs to be used for 4-7 days as directed. A thin layer should be applied twice a day to the treatment areas recommended by your physician.   If your physician prescribed you separate tubes of 5-fluourouracil and calcipotriene, apply a thin layer of 5-fluorouracil followed by a thin layer of calcipotriene.   Avoid contact with your eyes, nostrils, and mouth. Do not use 5-fluorouracil/calcipotriene cream on infected or open wounds.   You will develop redness, irritation and some crusting at areas where you have pre-cancer damage/actinic keratoses. IF YOU DEVELOP PAIN, BLEEDING, OR SIGNIFICANT CRUSTING, STOP THE TREATMENT EARLY - you have already gotten a good response and the actinic keratoses should clear up well.  Wash your hands after applying 5-fluorouracil 5% cream on your skin.   A moisturizer or sunscreen with a minimum SPF 30 should be applied each morning.   Once you have finished the treatment, you can apply a thin layer of Vaseline twice a day to irritated areas to soothe and calm the areas more quickly. If you experience significant discomfort, contact your physician.  For some patients it is necessary to repeat the treatment for best results.  SIDE EFFECTS: When using 5-fluorouracil/calcipotriene cream, you may have mild irritation, such as redness, dryness, swelling, or a mild burning sensation. This usually resolves within 2 weeks. The more actinic keratoses you have, the more redness and inflammation you can expect during treatment.  Eye irritation has been reported rarely. If this occurs, please let us know.  If you have any trouble using this cream, please call the office. If you have any other questions about this information, please do not  hesitate to ask me before you leave the office.  Actinic keratoses are precancerous spots that appear secondary to cumulative UV radiation exposure/sun exposure over time. They are chronic with expected duration over 1 year. A portion of actinic keratoses will progress to squamous cell carcinoma of the skin. It is not possible to reliably predict which spots will progress to skin cancer and so treatment is recommended to prevent development of skin cancer.  Recommend daily broad spectrum sunscreen SPF 30+ to sun-exposed areas, reapply every 2 hours as needed.  Recommend staying in the shade or wearing long sleeves, sun glasses (UVA+UVB protection) and wide brim hats (4-inch brim around the entire circumference of the hat). Call for new or changing lesions.   Cryotherapy Aftercare  Wash gently with soap and water everyday.   Apply Vaseline and Band-Aid daily until healed.     Melanoma ABCDEs  Melanoma is the most dangerous type of skin cancer, and is the leading cause of death from skin disease.  You are more likely to develop melanoma if you: Have light-colored skin, light-colored eyes, or red or blond hair Spend a lot of time in the sun Tan regularly, either outdoors or in a tanning bed Have had blistering sunburns, especially during childhood Have a close family member who has had a melanoma Have atypical moles or large birthmarks  Early detection of melanoma is key since treatment is typically straightforward and cure rates are extremely high if we catch it early.   The first sign of melanoma is often a change in a mole or a new dark spot.  The ABCDE system is a way of remembering the signs of melanoma.  A for asymmetry:  The two halves do not match. B for border:  The edges of the growth are irregular. C for color:  A mixture of colors are present instead of an even brown color. D for diameter:  Melanomas are usually (but not always) greater than 72m - the size of a pencil  eraser. E for evolution:  The spot keeps changing in size, shape, and color.  Please check your skin once per month between visits. You can use a small mirror in front and a large mirror behind you to keep an eye on the back side or your body.   If you see any new or changing lesions before your next follow-up, please call to schedule a visit.  Please continue daily skin protection including broad spectrum sunscreen SPF 30+ to sun-exposed areas, reapplying every 2 hours as needed when you're outdoors.   Staying in the shade or wearing long sleeves, sun glasses (UVA+UVB protection) and wide brim hats (4-inch brim around the entire circumference of the hat) are also recommended for sun protection.    Due to recent changes in healthcare laws, you may see results of your pathology and/or laboratory studies on MyChart before the doctors have had a chance to review them. We understand that in some cases there may be results that are confusing or concerning to you. Please understand that not all results are received at the same time and often the doctors may need to interpret multiple results in order to provide you with the best plan of care or course of treatment. Therefore, we ask that you  please give [place] 2 business days to thoroughly review all your results before contacting the office for clarification. Should we see a critical lab result, you will be contacted sooner.   If You Need Anything After Your Visit  If you have any questions or concerns for your doctor, please call our main line at [phone] and press option 4 to reach your doctor's medical assistant. If no one answers, please leave a voicemail as directed and we will return your call as soon as possible. Messages left after 4 pm will be answered the following business day.   You may also send [place] a message via [place]. We typically respond to MyChart messages within 1-2 business days.  For prescription refills, please ask your pharmacy  to contact our office. Our fax number is [phone].  If you have an urgent issue when the clinic is closed that cannot wait until the next business day, you can page your doctor at the number below.    Please note that while we do our best to be available for urgent issues outside of office hours, we are not available 24/7.   If you have an urgent issue and are unable to reach [place], you may choose to seek medical care at your doctor's office, retail clinic, urgent care center, or emergency room.  If you have a medical emergency, please immediately call 911 or go to the emergency department.  Pager Numbers  - Dr. Nehemiah Massed: [phone]  - Dr. Laurence Ferrari: [phone]  - Dr. Nicole Kindred: [phone]  In the event of inclement weather, please call our main line at [phone] for an update on the status of any delays or closures.  Dermatology Medication Tips: Please keep the boxes that topical medications come in in order to help keep track of the instructions about where and how to use these. Pharmacies typically print the medication instructions only on the boxes and not directly on the medication tubes.   If your medication is too expensive, please contact our office at [phone] option 4 or send [place] a message through [place].   We are unable to tell what your co-pay for medications will be in advance as this is different depending on your insurance coverage. However, we may be able to find a substitute medication at lower cost or fill out paperwork to get insurance to cover a needed medication.   If a prior authorization is required to get your medication covered by your insurance company, please allow [place] 1-2 business days to complete this process.  Drug prices often vary depending on where the prescription is filled and some pharmacies may offer cheaper prices.  The website www.goodrx.com contains coupons for medications through different pharmacies. The prices here do not account for  what the cost may be with help from insurance (it may be cheaper with your insurance), but the website can give you the price if you did not use any insurance.  - You can print the associated coupon and take it with your prescription to the pharmacy.  - You may also stop by our office during regular business hours and pick up a GoodRx coupon card.  - If you need your prescription sent electronically to a different pharmacy, notify our office through [facility] or by phone at [phone] option 4.     Si Usted Necesita Algo Despus de Su Visita  Tambin puede enviarnos un mensaje a travs de Pharmacist, community. Por lo general respondemos a los mensajes de MyChart en el transcurso de  1 a 2 das hbiles.  Para renovar recetas, por favor pida a su farmacia que se ponga en contacto con nuestra oficina. Harland Dingwall de fax es [place] [phone].  Si tiene un asunto urgente cuando la clnica est cerrada y que no puede esperar hasta el siguiente da hbil, puede llamar/localizar a su doctor(a) al nmero que aparece a continuacin.   Por favor, tenga en cuenta que aunque hacemos todo lo posible para estar disponibles para asuntos urgentes fuera del horario de [place], no estamos disponibles las 24 horas del da, los 7 das de la [place].   Si tiene un problema urgente y no puede comunicarse con nosotros, puede optar por buscar atencin mdica  en el consultorio de su doctor(a), en una clnica privada, en un centro de atencin urgente o en una sala de emergencias.  Si tiene Engineering geologist, por favor llame inmediatamente al 911 o vaya a la sala de emergencias.  Nmeros de bper  - Dr. Nehemiah Massed: [phone]  - Dra. Moye: [phone]  - Dra. Nicole Kindred: [phone]  En caso de inclemencias del [place], por favor llame a Johnsie Kindred principal al [phone] para una actualizacin sobre el [place] de cualquier retraso o cierre.  Consejos para la medicacin en dermatologa: Por favor, guarde  las cajas en las que vienen los medicamentos de uso tpico para ayudarle a seguir las instrucciones sobre dnde y cmo usarlos. Las farmacias generalmente imprimen las instrucciones del medicamento slo en las cajas y no directamente en los tubos del [place].   Si su medicamento es muy caro, por favor, pngase en contacto con Zigmund Daniel llamando al [phone] y presione la opcin 4 o envenos un mensaje a travs de Pharmacist, community.   No podemos decirle cul ser su copago por los medicamentos por adelantado ya que esto es diferente dependiendo de la cobertura de su seguro. Sin embargo, es posible que podamos encontrar un medicamento sustituto a Electrical engineer un formulario para que el seguro cubra el medicamento que se considera necesario.   Si se requiere una autorizacin previa para que su compaa de seguros [place] su medicamento, por favor permtanos de 1 a 2 das hbiles para completar este proceso.  Los precios de los medicamentos varan con frecuencia dependiendo del Environmental consultant de dnde se surte la receta y alguna farmacias pueden ofrecer precios ms baratos.  El sitio web www.goodrx.com tiene cupones para medicamentos de Airline pilot. Los precios aqu no tienen en cuenta lo que podra costar con la ayuda del seguro (puede ser ms barato con su seguro), pero el sitio web puede darle el precio si no utiliz Research scientist (physical sciences).  - Puede imprimir el cupn correspondiente y llevarlo con su receta a la farmacia.  - Tambin puede pasar por nuestra oficina durante el horario de atencin regular y Charity fundraiser una tarjeta de cupones de GoodRx.  - Si necesita que su receta se enve electrnicamente a una farmacia diferente, informe a nuestra oficina a travs de MyChart de [place] o por telfono llamando al [phone] y presione la opcin 4.

## 2022-05-27 NOTE — Progress Notes (Signed)
Follow-Up Visit   Subjective  Russell Simpson is a 61 y.o. male who presents for the following: Annual Exam (6 mth fbse. Hx of aks, hx of scc, bumps at chest getting pronounced, bumps at forearm that itch, place at left temple that keeps going back, right hairline, dark spots at right side of face. ).  The patient presents for Total-Body Skin Exam (TBSE) for skin cancer screening and mole check.  The patient has spots, moles and lesions to be evaluated, some may be new or changing and the patient has concerns that these could be cancer.   The following portions of the chart were reviewed this encounter and updated as appropriate:  Tobacco  Allergies  Meds  Problems  Med Hx  Surg Hx  Fam Hx      Review of Systems: No other skin or systemic complaints except as noted in HPI or Assessment and Plan.   Objective  Well appearing patient in no apparent distress; mood and affect are within normal limits.  A full examination was performed including scalp, head, eyes, ears, nose, lips, neck, chest, axillae, abdomen, back, buttocks, bilateral upper extremities, bilateral lower extremities, hands, feet, fingers, toes, fingernails, and toenails. All findings within normal limits unless otherwise noted below.  Right Upper Back 0.6 cm shiny red papule   right forehead x 1, right frontal hairline x 2, right temple x 1, left preauricular x 1, mid upper abdomen x 1, left chest x 1 (7) Erythematous papules with gritty scale.    Assessment & Plan  Open wound  Related Medications mupirocin ointment (BACTROBAN) 2 % Apply 1 Application topically daily. Apply to any open wounds until healed.  Neoplasm of uncertain behavior Right Upper Back  Skin / nail biopsy Type of biopsy: tangential   Informed consent: discussed and consent obtained   Timeout: patient name, date of birth, surgical site, and procedure verified   Patient was prepped and draped in usual sterile fashion: Area prepped with  isopropyl alcohol. Anesthesia: the lesion was anesthetized in a standard fashion   Anesthetic:  1% lidocaine w/ epinephrine 1-100,000 buffered w/ 8.4% NaHCO3 Instrument used: flexible razor blade   Hemostasis achieved with: aluminum chloride   Outcome: patient tolerated procedure well   Post-procedure details: wound care instructions given   Additional details:  Mupirocin and a bandage applied  Specimen 1 - Surgical pathology Differential Diagnosis: r/o scc   Check Margins: No  R/o scc   Hypertrophic actinic keratosis (7) right forehead x 1, right frontal hairline x 2, right temple x 1, left preauricular x 1, mid upper abdomen x 1, left chest x 1  Actinic keratoses are precancerous spots that appear secondary to cumulative UV radiation exposure/sun exposure over time. They are chronic with expected duration over 1 year. A portion of actinic keratoses will progress to squamous cell carcinoma of the skin. It is not possible to reliably predict which spots will progress to skin cancer and so treatment is recommended to prevent development of skin cancer.  Recommend daily broad spectrum sunscreen SPF 30+ to sun-exposed areas, reapply every 2 hours as needed.  Recommend staying in the shade or wearing long sleeves, sun glasses (UVA+UVB protection) and wide brim hats (4-inch brim around the entire circumference of the hat). Call for new or changing lesions.  Destruction of lesion - right forehead x 1, right frontal hairline x 2, right temple x 1, left preauricular x 1, mid upper abdomen x 1, left chest x 1  Destruction  method: cryotherapy   Informed consent: discussed and consent obtained   Lesion destroyed using liquid nitrogen: Yes   Cryotherapy cycles:  2 Outcome: patient tolerated procedure well with no complications   Post-procedure details: wound care instructions given   Additional details:  Prior to procedure, discussed risks of blister formation, small wound, skin dyspigmentation,  or rare scar following cryotherapy. Recommend Vaseline ointment to treated areas while healing.   fluorouracil (EFUDEX) 5 % cream - right forehead x 1, right frontal hairline x 2, right temple x 1, left preauricular x 1, mid upper abdomen x 1, left chest x 1 Apply topically 2 (two) times daily. Apply twice a day to affected areas as directed in handout.  calcipotriene (DOVONOX) 0.005 % cream - right forehead x 1, right frontal hairline x 2, right temple x 1, left preauricular x 1, mid upper abdomen x 1, left chest x 1 Apply topically 2 (two) times daily. Apply twice a day after fluorouracil to affected areas as directed in handout.  Lentigines - Scattered tan macules - Due to sun exposure - Benign-appearing, observe - Recommend daily broad spectrum sunscreen SPF 30+ to sun-exposed areas, reapply every 2 hours as needed. - Call for any changes  Seborrheic Keratoses - Stuck-on, waxy, tan-brown papules and/or plaques  - Benign-appearing - Discussed benign etiology and prognosis. - Observe - Call for any changes  Melanocytic Nevi - Tan-brown and/or pink-flesh-colored symmetric macules and papules - Benign appearing on exam today - Observation - Call clinic for new or changing moles - Recommend daily use of broad spectrum spf 30+ sunscreen to sun-exposed areas.   Hemangiomas - Red papules - Discussed benign nature - Observe - Call for any changes  Actinic Damage - Severe, confluent actinic changes with pre-cancerous actinic keratoses  - Severe, chronic, not at goal, secondary to cumulative UV radiation exposure over time - diffuse scaly erythematous macules and papules with underlying dyspigmentation - Discussed Prescription "Field Treatment" for Severe, Chronic Confluent Actinic Changes with Pre-Cancerous Actinic Keratoses Field treatment involves treatment of an entire area of skin that has confluent Actinic Changes (Sun/ Ultraviolet light damage) and PreCancerous Actinic  Keratoses by method of PhotoDynamic Therapy (PDT) and/or prescription Topical Chemotherapy agents such as 5-fluorouracil, 5-fluorouracil/calcipotriene, and/or imiquimod.  The purpose is to decrease the number of clinically evident and subclinical PreCancerous lesions to prevent progression to development of skin cancer by chemically destroying early precancer changes that may or may not be visible.  It has been shown to reduce the risk of developing skin cancer in the treated area. As a result of treatment, redness, scaling, crusting, and open sores may occur during treatment course. One or more than one of these methods may be used and may have to be used several times to control, suppress and eliminate the PreCancerous changes. Discussed treatment course, expected reaction, and possible side effects. - Recommend daily broad spectrum sunscreen SPF 30+ to sun-exposed areas, reapply every 2 hours as needed.  - Staying in the shade or wearing long sleeves, sun glasses (UVA+UVB protection) and wide brim hats (4-inch brim around the entire circumference of the hat) are also recommended. - Call for new or changing lesions. Start 5-fluorouracil cream followed by calcipotriene cream twice a day to affected areas. Treat the following areas one area at a time. After finishing treatment with these two creams, apply vaseline 2-3 times per day to affected areas while they are healing. Chest and front of shoulders: apply twice a day for 7 days Face (  avoiding eyes, mouth, and creases around nose): apply twice a day for 4 days Repeat treatment for chest once the chest is completely healed Right arm and back of right hand: apply twice a day for 7 days Left arm and back of left hand: apply twice a day for 7 days  History of Squamous Cell Carcinoma of the Skin Multiple areas  see history  - No evidence of recurrence today - No lymphadenopathy - Recommend regular full body skin exams - Recommend daily broad spectrum  sunscreen SPF 30+ to sun-exposed areas, reapply every 2 hours as needed.  - Call if any new or changing lesions are noted between office visits  History of Basal Cell Carcinoma of the Skin at right upper 01/2021 treated with ED&C - No evidence of recurrence today - Recommend regular full body skin exams - Recommend daily broad spectrum sunscreen SPF 30+ to sun-exposed areas, reapply every 2 hours as needed.  - Call if any new or changing lesions are noted between office visits  Skin cancer screening performed today. Return in about 4 months (around 09/26/2022) for f/u 62f/calcipotriene to multiple areas. I, MRuthell Rummage CMA, am acting as scribe for VForest Gleason MD.   Documentation: I have reviewed the above documentation for accuracy and completeness, and I agree with the above.  VForest Gleason MD

## 2022-05-28 ENCOUNTER — Encounter: Payer: Self-pay | Admitting: Internal Medicine

## 2022-05-28 DIAGNOSIS — S82832D Other fracture of upper and lower end of left fibula, subsequent encounter for closed fracture with routine healing: Secondary | ICD-10-CM | POA: Diagnosis not present

## 2022-05-28 DIAGNOSIS — S82422A Displaced transverse fracture of shaft of left fibula, initial encounter for closed fracture: Secondary | ICD-10-CM | POA: Diagnosis not present

## 2022-05-28 DIAGNOSIS — S82839A Other fracture of upper and lower end of unspecified fibula, initial encounter for closed fracture: Secondary | ICD-10-CM | POA: Insufficient documentation

## 2022-06-02 ENCOUNTER — Encounter: Payer: Self-pay | Admitting: Dermatology

## 2022-06-03 ENCOUNTER — Telehealth: Payer: Self-pay

## 2022-06-03 NOTE — Telephone Encounter (Addendum)
Tried calling patient regarding results and need to be scheduled for treatment. No answer. LMOM for patient to return call.    ----- Message from Alfonso Patten, MD sent at 06/03/2022 12:25 PM EDT ----- Skin , right upper back SQUAMOUS CELL CARCINOMA IN SITU, ULCERATED, INFLAMED --> ED&C in clinic  MAs please call and schedule. Thank you!

## 2022-06-04 ENCOUNTER — Telehealth: Payer: Self-pay

## 2022-06-04 NOTE — Telephone Encounter (Addendum)
Tried calling patient regarding bx results and need to schedule ED&C. No answer. LMOM for patient to call office.   ----- Message from Alfonso Patten, MD sent at 06/03/2022 12:25 PM EDT ----- Skin , right upper back SQUAMOUS CELL CARCINOMA IN SITU, ULCERATED, INFLAMED --> ED&C in clinic  MAs please call and schedule. Thank you!

## 2022-06-08 ENCOUNTER — Telehealth: Payer: Self-pay

## 2022-06-08 NOTE — Telephone Encounter (Addendum)
Tried to call patient regarding results and need to schedule treatment. Patient did not answer. LMOM for patient to return call to office.    ----- Message from Alfonso Patten, MD sent at 06/03/2022 12:25 PM EDT ----- Skin , right upper back SQUAMOUS CELL CARCINOMA IN SITU, ULCERATED, INFLAMED --> ED&C in clinic  MAs please call and schedule. Thank you!

## 2022-06-09 ENCOUNTER — Other Ambulatory Visit: Payer: Self-pay

## 2022-06-09 ENCOUNTER — Telehealth: Payer: Self-pay

## 2022-06-09 DIAGNOSIS — E785 Hyperlipidemia, unspecified: Secondary | ICD-10-CM

## 2022-06-09 NOTE — Telephone Encounter (Signed)
Patient advised of BX results and scheduled for [facility] 07/18. aw

## 2022-06-09 NOTE — Telephone Encounter (Signed)
-----   Message from Alfonso Patten, MD sent at 06/03/2022 12:25 PM EDT ----- Skin , right upper back SQUAMOUS CELL CARCINOMA IN SITU, ULCERATED, INFLAMED --> ED&C in clinic  MAs please call and schedule. Thank you!

## 2022-06-09 NOTE — Progress Notes (Signed)
Changed patient lipid lab order to [facility] mall.

## 2022-06-11 DIAGNOSIS — S82832D Other fracture of upper and lower end of left fibula, subsequent encounter for closed fracture with routine healing: Secondary | ICD-10-CM | POA: Diagnosis not present

## 2022-06-11 DIAGNOSIS — S82422A Displaced transverse fracture of shaft of left fibula, initial encounter for closed fracture: Secondary | ICD-10-CM | POA: Diagnosis not present

## 2022-06-16 ENCOUNTER — Ambulatory Visit: Payer: Medicare HMO | Admitting: Dermatology

## 2022-06-18 ENCOUNTER — Ambulatory Visit: Payer: Medicare HMO | Admitting: Dermatology

## 2022-06-18 ENCOUNTER — Encounter: Payer: Self-pay | Admitting: Dermatology

## 2022-06-18 DIAGNOSIS — D099 Carcinoma in situ, unspecified: Secondary | ICD-10-CM

## 2022-06-18 DIAGNOSIS — D045 Carcinoma in situ of skin of trunk: Secondary | ICD-10-CM

## 2022-06-18 NOTE — Patient Instructions (Addendum)
Wound Care Instructions  Cleanse wound gently with soap and water once a day then pat dry with clean gauze. Apply a thing coat of Petrolatum (petroleum jelly, "Vaseline") over the wound (unless you have an allergy to this). We recommend that you use a new, sterile tube of Vaseline. Do not pick or remove scabs. Do not remove the yellow or white "healing tissue" from the base of the wound.  Cover the wound with fresh, clean, nonstick gauze and secure with paper tape. You may use Band-Aids in place of gauze and tape if the would is small enough, but would recommend trimming much of the tape off as there is often too much. Sometimes Band-Aids can irritate the skin.  You should call the office for your biopsy report after 1 week if you have not already been contacted.  If you experience any problems, such as abnormal amounts of bleeding, swelling, significant bruising, significant pain, or evidence of infection, please call the office immediately.  FOR ADULT SURGERY PATIENTS: If you need something for pain relief you may take 1 extra strength Tylenol (acetaminophen) AND 2 Ibuprofen (200mg each) together every 4 hours as needed for pain. (do not take these if you are allergic to them or if you have a reason you should not take them.) Typically, you may only need pain medication for 1 to 3 days.    Due to recent changes in healthcare laws, you may see results of your pathology and/or laboratory studies on MyChart before the doctors have had a chance to review them. We understand that in some cases there may be results that are confusing or concerning to you. Please understand that not all results are received at the same time and often the doctors may need to interpret multiple results in order to provide you with the best plan of care or course of treatment. Therefore, we ask that you please give us 2 business days to thoroughly review all your results before contacting the office for clarification. Should we  see a critical lab result, you will be contacted sooner.   If You Need Anything After Your Visit  If you have any questions or concerns for your doctor, please call our main line at [phone] and press option 4 to reach your doctor's medical assistant. If no one answers, please leave a voicemail as directed and we will return your call as soon as possible. Messages left after 4 pm will be answered the following business day.   You may also send us a message via MyChart. We typically respond to MyChart messages within 1-2 business days.  For prescription refills, please ask your pharmacy to contact our office. Our fax number is [phone].  If you have an urgent issue when the clinic is closed that cannot wait until the next business day, you can page your doctor at the number below.    Please note that while we do our best to be available for urgent issues outside of office hours, we are not available 24/7.   If you have an urgent issue and are unable to reach us, you may choose to seek medical care at your doctor's office, retail clinic, urgent care center, or emergency room.  If you have a medical emergency, please immediately call 911 or go to the emergency department.  Pager Numbers  - Dr. Kowalski: [phone]  - Dr. Moye: [phone]  - Dr. Stewart: [phone]  In the event of inclement weather, please call our main line at [phone]   for an update on the status of any delays or closures.  Dermatology Medication Tips: Please keep the boxes that topical medications come in in order to help keep track of the instructions about where and how to use these. Pharmacies typically print the medication instructions only on the boxes and not directly on the medication tubes.   If your medication is too expensive, please contact our office at [phone] option 4 or send us a message through MyChart.   We are unable to tell what your co-pay for medications will be in advance  as this is different depending on your insurance coverage. However, we may be able to find a substitute medication at lower cost or fill out paperwork to get insurance to cover a needed medication.   If a prior authorization is required to get your medication covered by your insurance company, please allow us 1-2 business days to complete this process.  Drug prices often vary depending on where the prescription is filled and some pharmacies may offer cheaper prices.  The website www.goodrx.com contains coupons for medications through different pharmacies. The prices here do not account for what the cost may be with help from insurance (it may be cheaper with your insurance), but the website can give you the price if you did not use any insurance.  - You can print the associated coupon and take it with your prescription to the pharmacy.  - You may also stop by our office during regular business hours and pick up a GoodRx coupon card.  - If you need your prescription sent electronically to a different pharmacy, notify our office through [facility] MyChart or by phone at [phone] option 4.     Si Usted Necesita Algo Despus de Su Visita  Tambin puede enviarnos un mensaje a travs de MyChart. Por lo general respondemos a los mensajes de MyChart en el transcurso de 1 a 2 das hbiles.  Para renovar recetas, por favor pida a su farmacia que se ponga en contacto con nuestra oficina. Nuestro nmero de fax es el [phone].  Si tiene un asunto urgente cuando la clnica est cerrada y que no puede esperar hasta el siguiente da hbil, puede llamar/localizar a su doctor(a) al nmero que aparece a continuacin.   Por favor, tenga en cuenta que aunque hacemos todo lo posible para estar disponibles para asuntos urgentes fuera del horario de oficina, no estamos disponibles las 24 horas del da, los 7 das de la semana.   Si tiene un problema urgente y no puede comunicarse con nosotros, puede optar  por buscar atencin mdica  en el consultorio de su doctor(a), en una clnica privada, en un centro de atencin urgente o en una sala de emergencias.  Si tiene una emergencia mdica, por favor llame inmediatamente al 911 o vaya a la sala de emergencias.  Nmeros de bper  - Dr. Kowalski: [phone]  - Dra. Moye: [phone]  - Dra. Stewart: [phone]  En caso de inclemencias del tiempo, por favor llame a nuestra lnea principal al [phone] para una actualizacin sobre el estado de cualquier retraso o cierre.  Consejos para la medicacin en dermatologa: Por favor, guarde las cajas en las que vienen los medicamentos de uso tpico para ayudarle a seguir las instrucciones sobre dnde y cmo usarlos. Las farmacias generalmente imprimen las instrucciones del medicamento slo en las cajas y no directamente en los tubos del medicamento.   Si su medicamento es muy caro, por favor, pngase en contacto con nuestra   oficina llamando al [phone] y presione la opcin 4 o envenos un mensaje a travs de MyChart.   No podemos decirle cul ser su copago por los medicamentos por adelantado ya que esto es diferente dependiendo de la cobertura de su seguro. Sin embargo, es posible que podamos encontrar un medicamento sustituto a menor costo o llenar un formulario para que el seguro cubra el medicamento que se considera necesario.   Si se requiere una autorizacin previa para que su compaa de seguros cubra su medicamento, por favor permtanos de 1 a 2 das hbiles para completar este proceso.  Los precios de los medicamentos varan con frecuencia dependiendo del lugar de dnde se surte la receta y alguna farmacias pueden ofrecer precios ms baratos.  El sitio web www.goodrx.com tiene cupones para medicamentos de diferentes farmacias. Los precios aqu no tienen en cuenta lo que podra costar con la ayuda del seguro (puede ser ms barato con su seguro), pero el sitio web puede darle el precio si  no utiliz ningn seguro.  - Puede imprimir el cupn correspondiente y llevarlo con su receta a la farmacia.  - Tambin puede pasar por nuestra oficina durante el horario de atencin regular y recoger una tarjeta de cupones de GoodRx.  - Si necesita que su receta se enve electrnicamente a una farmacia diferente, informe a nuestra oficina a travs de MyChart de [place] o por telfono llamando al [phone] y presione la opcin 4.  

## 2022-06-18 NOTE — Progress Notes (Signed)
   Follow-Up Visit   Subjective  Russell Simpson is a 61 y.o. male who presents for the following: Procedure (Patient here today to treat bx proven SCC at right upper back. ).   The following portions of the chart were reviewed this encounter and updated as appropriate:   Tobacco  Allergies  Meds  Problems  Med Hx  Surg Hx  Fam Hx      Review of Systems:  No other skin or systemic complaints except as noted in HPI or Assessment and Plan.  Objective  Well appearing patient in no apparent distress; mood and affect are within normal limits.  A focused examination was performed including back. Relevant physical exam findings are noted in the Assessment and Plan.  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  Destruction of lesion  Destruction method: electrodesiccation and curettage   Informed consent: discussed and consent obtained   Timeout:  patient name, date of birth, surgical site, and procedure verified Anesthesia: the lesion was anesthetized in a standard fashion   Anesthetic:  1% lidocaine w/ epinephrine 1-100,000 buffered w/ 8.4% NaHCO3 Curettage performed in three different directions: Yes   Electrodesiccation performed over the curetted area: Yes   Curettage cycles:  3 Final wound size (cm):  0.9 Hemostasis achieved with:  electrodesiccation Outcome: patient tolerated procedure well with no complications   Post-procedure details: sterile dressing applied and wound care instructions given   Dressing type: petrolatum     Return for as scheduled.  Graciella Belton, RMA, am acting as scribe for Forest Gleason, MD .  Documentation: I have reviewed the above documentation for accuracy and completeness, and I agree with the above.  Forest Gleason, MD

## 2022-06-22 ENCOUNTER — Other Ambulatory Visit: Payer: Self-pay

## 2022-06-23 ENCOUNTER — Other Ambulatory Visit
Admission: RE | Admit: 2022-06-23 | Discharge: 2022-06-23 | Disposition: A | Discharge status: Home / Self Care | Payer: Medicare HMO | Attending: Cardiology | Admitting: Cardiology

## 2022-06-23 DIAGNOSIS — E785 Hyperlipidemia, unspecified: Secondary | ICD-10-CM | POA: Insufficient documentation

## 2022-06-23 LAB — LIPID PANEL
Cholesterol: 164 mg/dL (ref 0–200)
HDL: 43 mg/dL (ref >40)
LDL Cholesterol: 102 mg/dL — ABNORMAL HIGH (ref 0–99)
Total CHOL/HDL Ratio: 3.8 RATIO
Triglycerides: 94 mg/dL (ref <150)
VLDL: 19 mg/dL (ref 0–40)

## 2022-06-24 ENCOUNTER — Telehealth: Payer: Self-pay | Admitting: Internal Medicine

## 2022-06-24 NOTE — Telephone Encounter (Signed)
Called and spoke with pharmacy. Stated that Dr Neomia Dear was no longer in practice and was told that they would cancel the refill request and let the patient know that they would need to speak with office for an appt to have these medication

## 2022-06-24 NOTE — Telephone Encounter (Signed)
Medication Refill - Medication: SPIRIVA RESPIMAT 2.5 MCG/ACT AERS, Duloxetine 60 MG tablet  Jona from pharmacy requesting 90 day refills for both.   Has the patient contacted their pharmacy? Yes.   Pharmacy is calling.   (Agent: If yes, when and what did the pharmacy advise?)  Preferred Pharmacy (with phone number or street name):  [facility], [address]  [place] [address]  Phone: [phone] Fax: [phone]  Hours: Not open 24 hours   Has the patient been seen for an appointment in the last year OR does the patient have an upcoming appointment? Yes.    Agent: Please be advised that RX refills may take up to 3 business days. We ask that you follow-up with your pharmacy.

## 2022-06-26 ENCOUNTER — Telehealth: Payer: Self-pay | Admitting: *Deleted

## 2022-06-26 DIAGNOSIS — E785 Hyperlipidemia, unspecified: Secondary | ICD-10-CM

## 2022-06-26 MED ORDER — ATORVASTATIN CALCIUM 80 MG PO TABS: 80.0000 mg | ORAL_TABLET | Freq: Every day | ORAL | 3 refills | Status: DC

## 2022-06-26 NOTE — Telephone Encounter (Signed)
Pt notified of lab results and Dr. Thereasa Solo recc.  Pt voiced understanding.  Pt will incr atorvastatin to 80 mg daily. Pt will double current dose of 40 mg and new Rx sent to [name].  Lab orders placed and instructions given to have fasting [facility] the end of October for 3 months.   Pt has no further questions at this time.

## 2022-06-26 NOTE — Telephone Encounter (Signed)
-----   Message from Kate Sable, MD sent at 06/25/2022  4:35 PM EDT ----- Cholesterol improved but still elevated, increase Lipitor to 80 mg daily.  Repeat fasting lipid profile in 3 months.

## 2022-06-30 ENCOUNTER — Other Ambulatory Visit: Payer: Self-pay

## 2022-07-01 DIAGNOSIS — F3162 Bipolar disorder, current episode mixed, moderate: Secondary | ICD-10-CM | POA: Diagnosis not present

## 2022-07-01 DIAGNOSIS — F410 Panic disorder [episodic paroxysmal anxiety] without agoraphobia: Secondary | ICD-10-CM | POA: Diagnosis not present

## 2022-07-01 DIAGNOSIS — F1010 Alcohol abuse, uncomplicated: Secondary | ICD-10-CM | POA: Diagnosis not present

## 2022-07-02 ENCOUNTER — Encounter: Payer: Self-pay | Admitting: Family

## 2022-07-09 DIAGNOSIS — S82422A Displaced transverse fracture of shaft of left fibula, initial encounter for closed fracture: Secondary | ICD-10-CM | POA: Diagnosis not present

## 2022-07-14 ENCOUNTER — Ambulatory Visit: Payer: Medicare HMO | Admitting: Unknown Physician Specialty

## 2022-07-20 ENCOUNTER — Ambulatory Visit: Payer: Medicare HMO | Admitting: Gastroenterology

## 2022-07-20 ENCOUNTER — Other Ambulatory Visit: Payer: Self-pay

## 2022-07-20 NOTE — Progress Notes (Deleted)
[name] MD, MRCP(U.K) [address]  [place]  [place], [place] [address]  Main: [phone]  Fax: [phone]   Primary Care Physician: [facility]  Primary Gastroenterologist:  Dr. [name]   No chief complaint on file.   HPI: Russell Simpson is a 61 y.o. 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  On 03/10/2021 was noted to have positive hepatitis B core antibody with surface antigen negative and surface antibody strongly positive.CT abdomen on 02/26/2021 showed no evidence of portal hypertension   02/27/2021 platelet count is 91   He denies any prior tattoos, military service, blood transfusions, illegal drug use.  He does collect receiving vaccination while at school from the vaccination gun.   12/30/2021 CMP shows normal transaminases and total bilirubin.   Unclear if he has continued uninterrupted his tenofovir I see that at some point it was discontinued by various doctors.  When I asked him if he was taking the medication he said yes but he could not name the medication he was taking nor did it sound familiar when I mentioned to him the medication name.  He states he is still on Rituxan.  Drinking some alcohol but trying to stop.   Interval history 03/05/2022-07/20/2022  03/05/2022: Hepatitis B virus not detected  04/01/2022: Alkaline phosphatase 159 but normal ALT and AST.   He follows with oncology for follicular lymphoma.  He is on Rituxan.  Significant improvement with therapy.  Had been drinking alcohol trying to cut.  He had been out of his tenofovir.  Not followed up with [place].  We had offered a virtual visit in February did not hear back from him.     Current Outpatient Medications  Medication Sig Dispense Refill   acetaminophen (TYLENOL) 325 MG tablet Take 2 tablets (650 mg total) by mouth daily. for 5 days starting 2 days prior to  treatment 30 tablet 0   albuterol (VENTOLIN HFA) 108 (90 Base) MCG/ACT inhaler Inhale 2 puffs into the lungs every 6 (six) hours as needed for wheezing or shortness of breath. 8 g 2   allopurinol (ZYLOPRIM) 300 MG tablet TAKE 1 TABLET BY MOUTH ONCE DAILY 30 tablet 3   AMBULATORY NON FORMULARY MEDICATION Trimix (30/1/10)-(Pap/Phent/PGE)  Test Dose  68m vial   Qty #3 [id number] [phone](Patient not taking: Reported on 04/01/2022) 3 mL 0   amLODipine (NORVASC) 5 MG tablet Take 1 tablet by mouth daily.     atorvastatin (LIPITOR) 80 MG tablet Take 1 tablet (80 mg total) by mouth daily. 90 tablet 3   buPROPion (WELLBUTRIN XL) 300 MG 24 hr tablet Take 1 tablet (300 mg total) by mouth daily. 30 tablet 4   calcipotriene (DOVONOX) 0.005 % cream Apply topically 2 (two) times daily. Apply twice a day after fluorouracil to affected areas as directed in handout. 60 g 1   celecoxib (CELEBREX) 200 MG capsule every 12 (twelve) hours     clonazePAM (KLONOPIN) 0.5 MG tablet Take 0.5 mg by mouth daily.     DULoxetine (CYMBALTA) 60 MG capsule Take 60 mg by mouth daily.     famotidine (PEPCID) 20 MG tablet TAKE 1 TABLET TWICE DAILY 180 tablet 3   fexofenadine (ALLEGRA ALLERGY) 180 MG tablet Take 1 tablet (180 mg total) by mouth daily. 10 tablet 1   finasteride (PROSCAR)  5 MG tablet Take 1 tablet (5 mg total) by mouth daily. 90 tablet 2   fluorouracil (EFUDEX) 5 % cream Apply topically 2 (two) times daily. Apply twice a day to affected areas as directed in handout. 40 g 1   lansoprazole (PREVACID) 30 MG capsule TAKE 1 CAPSULE BY MOUTH ONCE DAILY AT NOON 30 capsule 2   metoprolol succinate (TOPROL-XL) 50 MG 24 hr tablet Take 2 tablets (100 mg total) by mouth daily. Take with or immediately following a meal. 90 tablet 0   mirtazapine (REMERON) 15 MG tablet Take 15 mg by mouth at bedtime.     modafinil (PROVIGIL) 200 MG tablet Take 1 tablet by mouth daily.     montelukast  (SINGULAIR) 10 MG tablet TAKE 1 TABLET AT BEDTIME 90 tablet 3   mupirocin ointment (BACTROBAN) 2 % Apply 1 Application topically daily. Apply to any open wounds until healed. 22 g 3   Nicotine (NICOTROL NS) 10 MG/ML SOLN 2 sprays daily.     OLANZapine (ZYPREXA) 5 MG tablet Take 5 mg by mouth at bedtime.     sildenafil (REVATIO) 20 MG tablet TAKE 3-5 TABLETS BY MOUTH ONCE DAILY AS NEEDED FOR ERECTILE DYSFUNCTION 30 tablet 0   SPIRIVA RESPIMAT 2.5 MCG/ACT AERS INHALE 2 PUFFS INTO THE LUNGS ONCE DAILY. *RINSE MOUTH AFTER USE* 4 g 1   tenofovir (VIREAD) 300 MG tablet Take 300 mg by mouth daily.     testosterone cypionate (DEPOTESTOSTERONE CYPIONATE) 200 MG/ML injection Inject 1 mL (200 mg total) into the muscle every 14 (fourteen) days. 10 mL 0   traMADol (ULTRAM) 50 MG tablet Take 1 tablet by mouth daily.     No current facility-administered medications for this visit.   Facility-Administered Medications Ordered in Other Visits  Medication Dose Route Frequency Provider Last Rate Last Admin   heparin lock flush 100 unit/mL  500 Units Intravenous Once Sindy Guadeloupe, MD       heparin lock flush 100 unit/mL  500 Units Intravenous Once Sindy Guadeloupe, MD       sodium chloride flush (NS) 0.9 % injection 10 mL  10 mL Intravenous PRN Sindy Guadeloupe, MD        Allergies as of 07/20/2022 - Review Complete 06/18/2022  Allergen Reaction Noted   Penicillins Anaphylaxis 09/03/2015   Tetanus toxoids Swelling 09/03/2015   Lisinopril Cough 10/15/2015   Losartan  10/15/2015   Tetanus toxoid     Codeine Nausea Only and Nausea And Vomiting 12/05/2020   Doxycycline Rash 01/07/2019   Ruxience [rituximab-pvvr] Rash 05/08/2021    ROS:  General: Negative for anorexia, weight loss, fever, chills, fatigue, weakness. ENT: Negative for hoarseness, difficulty swallowing , nasal congestion. CV: Negative for chest pain, angina, palpitations, dyspnea on exertion, peripheral edema.  Respiratory: Negative for dyspnea  at rest, dyspnea on exertion, cough, sputum, wheezing.  GI: See history of present illness. GU:  Negative for dysuria, hematuria, urinary incontinence, urinary frequency, nocturnal urination.  Endo: Negative for unusual weight change.    Physical Examination:   There were no vitals taken for this visit.  General: Well-nourished, well-developed in no acute distress.  Eyes: No icterus. Conjunctivae pink. Mouth: Oropharyngeal mucosa moist and pink , no lesions erythema or exudate. Lungs: Clear to auscultation bilaterally. Non-labored. Heart: Regular rate and rhythm, no murmurs rubs or gallops.  Abdomen: Bowel sounds are normal, nontender, nondistended, no hepatosplenomegaly or masses, no abdominal bruits or hernia , no rebound or guarding.   Extremities: No  lower extremity edema. No clubbing or deformities. Neuro: Alert and oriented x 3.  Grossly intact. Skin: Warm and dry, no jaundice.   Psych: Alert and cooperative, normal mood and affect.   Imaging Studies: No results found.  Assessment and Plan:   Russell Simpson is a 61 y.o. y/o male  here to follow-up for positive hepatitis B core antibody with a negative surface antigen in the setting of treatment of a lymphoma with Rituxan.  Commenced on tenofovir prior to commencing his chemotherapy.  Unclear if he has been taking it.  He said he had been taking it but could not recollect what exactly he was taking   Plan 1.  Check hepatitis B viral load. 2.  Continue on tenofovir for for  at least 12 months after stopping anti-CD20 agents since there is a lag in the recovery of B cell function among such patients.  I will give him a refill of his tenofovir.  He has been clearly instructed that this medication should not be stopped until I suggested him to do so.  Cessation of this medication while on Rituxan can cause him to develop acute flare of hepatitis B and potentially even liver failure  Dr [name]  MD,MRCP [facility]) Follow up in ***

## 2022-07-21 ENCOUNTER — Ambulatory Visit: Payer: Medicare HMO | Admitting: Pulmonary Disease

## 2022-07-28 ENCOUNTER — Other Ambulatory Visit: Payer: Self-pay

## 2022-07-28 MED ORDER — FEXOFENADINE HCL 180 MG PO TABS: 180.0000 mg | ORAL_TABLET | Freq: Every day | ORAL | 1 refills | Status: DC

## 2022-07-28 NOTE — Telephone Encounter (Signed)
LOV 04/27/22  Future visit 08/11/22

## 2022-07-29 ENCOUNTER — Ambulatory Visit: Payer: Medicare HMO | Admitting: Dermatology

## 2022-07-29 DIAGNOSIS — L570 Actinic keratosis: Secondary | ICD-10-CM

## 2022-07-29 DIAGNOSIS — S82832A Other fracture of upper and lower end of left fibula, initial encounter for closed fracture: Secondary | ICD-10-CM | POA: Diagnosis not present

## 2022-07-29 DIAGNOSIS — L578 Other skin changes due to chronic exposure to nonionizing radiation: Secondary | ICD-10-CM | POA: Diagnosis not present

## 2022-07-29 DIAGNOSIS — L989 Disorder of the skin and subcutaneous tissue, unspecified: Secondary | ICD-10-CM | POA: Diagnosis not present

## 2022-07-29 MED ORDER — TRIAMCINOLONE ACETONIDE 0.025 % EX OINT: 1.0000 | TOPICAL_OINTMENT | Freq: Two times a day (BID) | CUTANEOUS | 0 refills | Status: DC

## 2022-07-29 NOTE — Patient Instructions (Addendum)
Start triamcinolone ointment to affected areas of rash twice daily for 1 week Avoid applying to face, groin, and axilla. Use as directed. Long-term use can cause thinning of the skin.  Topical steroids (such as triamcinolone, fluocinolone, fluocinonide, mometasone, clobetasol, halobetasol, betamethasone, hydrocortisone) can cause thinning and lightening of the skin if they are used for too long in the same area. Your physician has selected the right strength medicine for your problem and area affected on the body. Please use your medication only as directed by your physician to prevent side effects.   Once completely healed can restart - Start 5-fluorouracil/calcipotriene cream twice a day for 7 days to affected areas including chest and shoulders.  Stop once red and crusty   5-Fluorouracil/Calcipotriene Patient Education   Actinic keratoses are the dry, red scaly spots on the skin caused by sun damage. A portion of these spots can turn into skin cancer with time, and treating them can help prevent development of skin cancer.   Treatment of these spots requires removal of the defective skin cells. There are various ways to remove actinic keratoses, including freezing with liquid nitrogen, treatment with creams, or treatment with a blue light procedure in the office.   5-fluorouracil cream is a topical cream used to treat actinic keratoses. It works by interfering with the growth of abnormal fast-growing skin cells, such as actinic keratoses. These cells peel off and are replaced by healthy ones.   5-fluorouracil/calcipotriene is a combination of the 5-fluorouracil cream with a vitamin D analog cream called calcipotriene. The calcipotriene alone does not treat actinic keratoses. However, when it is combined with 5-fluorouracil, it helps the 5-fluorouracil treat the actinic keratoses much faster so that the same results can be achieved with a much shorter treatment time.  INSTRUCTIONS FOR  5-FLUOROURACIL/CALCIPOTRIENE CREAM:   5-fluorouracil/calcipotriene cream typically only needs to be used for 4-7 days. A thin layer should be applied twice a day to the treatment areas recommended by your physician.   If your physician prescribed you separate tubes of 5-fluourouracil and calcipotriene, apply a thin layer of 5-fluorouracil followed by a thin layer of calcipotriene.   Avoid contact with your eyes, nostrils, and mouth. Do not use 5-fluorouracil/calcipotriene cream on infected or open wounds.   You will develop redness, irritation and some crusting at areas where you have pre-cancer damage/actinic keratoses. IF YOU DEVELOP PAIN, BLEEDING, OR SIGNIFICANT CRUSTING, STOP THE TREATMENT EARLY - you have already gotten a good response and the actinic keratoses should clear up well.  Wash your hands after applying 5-fluorouracil 5% cream on your skin.   A moisturizer or sunscreen with a minimum SPF 30 should be applied each morning.   Once you have finished the treatment, you can apply a thin layer of Vaseline twice a day to irritated areas to soothe and calm the areas more quickly. If you experience significant discomfort, contact your physician.  For some patients it is necessary to repeat the treatment for best results.  SIDE EFFECTS: When using 5-fluorouracil/calcipotriene cream, you may have mild irritation, such as redness, dryness, swelling, or a mild burning sensation. This usually resolves within 2 weeks. The more actinic keratoses you have, the more redness and inflammation you can expect during treatment. Eye irritation has been reported rarely. If this occurs, please let us know.  If you have any trouble using this cream, please call the office. If you have any other questions about this information, please do not hesitate to ask me before you leave  the office.    Due to recent changes in healthcare laws, you may see results of your pathology and/or laboratory studies on  MyChart before the doctors have had a chance to review them. We understand that in some cases there may be results that are confusing or concerning to you. Please understand that not all results are received at the same time and often the doctors may need to interpret multiple results in order to provide you with the best plan of care or course of treatment. Therefore, we ask that you please give [place] 2 business days to thoroughly review all your results before contacting the office for clarification. Should we see a critical lab result, you will be contacted sooner.   If You Need Anything After Your Visit  If you have any questions or concerns for your doctor, please call our main line at [phone] and press option 4 to reach your doctor's medical assistant. If no one answers, please leave a voicemail as directed and we will return your call as soon as possible. Messages left after 4 pm will be answered the following business day.   You may also send [place] a message via [place]. We typically respond to MyChart messages within 1-2 business days.  For prescription refills, please ask your pharmacy to contact our office. Our fax number is [phone].  If you have an urgent issue when the clinic is closed that cannot wait until the next business day, you can page your doctor at the number below.    Please note that while we do our best to be available for urgent issues outside of office hours, we are not available 24/7.   If you have an urgent issue and are unable to reach [place], you may choose to seek medical care at your doctor's office, retail clinic, urgent care center, or emergency room.  If you have a medical emergency, please immediately call 911 or go to the emergency department.  Pager Numbers  - Dr. Nehemiah Massed: [phone]  - Dr. Laurence Ferrari: [phone]  - Dr. Nicole Kindred: [phone]  In the event of inclement weather, please call our main line at [phone] for an update on the status of  any delays or closures.  Dermatology Medication Tips: Please keep the boxes that topical medications come in in order to help keep track of the instructions about where and how to use these. Pharmacies typically print the medication instructions only on the boxes and not directly on the medication tubes.   If your medication is too expensive, please contact our office at [phone] option 4 or send [place] a message through [place].   We are unable to tell what your co-pay for medications will be in advance as this is different depending on your insurance coverage. However, we may be able to find a substitute medication at lower cost or fill out paperwork to get insurance to cover a needed medication.   If a prior authorization is required to get your medication covered by your insurance company, please allow [place] 1-2 business days to complete this process.  Drug prices often vary depending on where the prescription is filled and some pharmacies may offer cheaper prices.  The website www.goodrx.com contains coupons for medications through different pharmacies. The prices here do not account for what the cost may be with help from insurance (it may be cheaper with your insurance), but the website can give you the price if you did not use any insurance.  - You can print the  associated coupon and take it with your prescription to the pharmacy.  - You may also stop by our office during regular business hours and pick up a GoodRx coupon card.  - If you need your prescription sent electronically to a different pharmacy, notify our office through [facility] or by phone at [phone] option 4.     Si Usted Necesita Algo Despus de Su Visita  Tambin puede enviarnos un mensaje a travs de Pharmacist, community. Por lo general respondemos a los mensajes de MyChart en el transcurso de 1 a 2 das hbiles.  Para renovar recetas, por favor pida a su farmacia que se ponga en contacto con nuestra oficina. Harland Dingwall de fax es [place] [phone].  Si tiene un asunto urgente cuando la clnica est cerrada y que no puede esperar hasta el siguiente da hbil, puede llamar/localizar a su doctor(a) al nmero que aparece a continuacin.   Por favor, tenga en cuenta que aunque hacemos todo lo posible para estar disponibles para asuntos urgentes fuera del horario de [place], no estamos disponibles las 24 horas del da, los 7 das de la [place].   Si tiene un problema urgente y no puede comunicarse con nosotros, puede optar por buscar atencin mdica  en el consultorio de su doctor(a), en una clnica privada, en un centro de atencin urgente o en una sala de emergencias.  Si tiene Engineering geologist, por favor llame inmediatamente al 911 o vaya a la sala de emergencias.  Nmeros de bper  - Dr. Nehemiah Massed: [phone]  - Dra. Moye: [phone]  - Dra. Nicole Kindred: [phone]  En caso de inclemencias del [place], por favor llame a Johnsie Kindred principal al [phone] para una actualizacin sobre el [place] de cualquier retraso o cierre.  Consejos para la medicacin en dermatologa: Por favor, guarde las cajas en las que vienen los medicamentos de uso tpico para ayudarle a seguir las instrucciones sobre dnde y cmo usarlos. Las farmacias generalmente imprimen las instrucciones del medicamento slo en las cajas y no directamente en los tubos del [place].   Si su medicamento es muy caro, por favor, pngase en contacto con Zigmund Daniel llamando al [phone] y presione la opcin 4 o envenos un mensaje a travs de Pharmacist, community.   No podemos decirle cul ser su copago por los medicamentos por adelantado ya que esto es diferente dependiendo de la cobertura de su seguro. Sin embargo, es posible que podamos encontrar un medicamento sustituto a Electrical engineer un formulario para que el seguro cubra el medicamento que se considera necesario.   Si se requiere una autorizacin previa para que su compaa de  seguros [place] su medicamento, por favor permtanos de 1 a 2 das hbiles para completar este proceso.  Los precios de los medicamentos varan con frecuencia dependiendo del Environmental consultant de dnde se surte la receta y alguna farmacias pueden ofrecer precios ms baratos.  El sitio web www.goodrx.com tiene cupones para medicamentos de Airline pilot. Los precios aqu no tienen en cuenta lo que podra costar con la ayuda del seguro (puede ser ms barato con su seguro), pero el sitio web puede darle el precio si no utiliz Research scientist (physical sciences).  - Puede imprimir el cupn correspondiente y llevarlo con su receta a la farmacia.  - Tambin puede pasar por nuestra oficina durante el horario de atencin regular y Charity fundraiser una tarjeta de cupones de GoodRx.  - Si necesita que su receta se enve electrnicamente a Chiropodist, informe a nuestra oficina a  Lawerance Cruel de MyChart de [place] o por telfono llamando al [phone] y presione la opcin 4.

## 2022-07-29 NOTE — Progress Notes (Signed)
error 

## 2022-07-29 NOTE — Progress Notes (Signed)
Follow-Up Visit   Subjective  Russell Simpson is a 61 y.o. male who presents for the following: Rash (Patient here today concerning painful rash he got after using 5 fluorouracil cream to affected areas of chest and shoulder 4 to 5 days ago to treat precancers. Patient states he used cream for 4 days to areas and got really inflamed and stop cream. He is now using mupirocin ointment at areas but would like to know if he needed more treatment like antibiotics to help heal areas.).   The following portions of the chart were reviewed this encounter and updated as appropriate:  Tobacco  Allergies  Meds  Problems  Med Hx  Surg Hx  Fam Hx      Review of Systems: No other skin or systemic complaints except as noted in HPI or Assessment and Plan.   Objective  Well appearing patient in no apparent distress; mood and affect are within normal limits.  A focused examination was performed including chest and shoulders. Relevant physical exam findings are noted in the Assessment and Plan.  chest and shoulders Widespread erythematous scaly papules, plaques with erosions   Assessment & Plan     Skin erosion chest and shoulders  Secondary to good/strong reaction to 5-FU/calcipotriene for severe actinic damage  Start tmc 0.25 % ointment - apply topically to aa bid for rash. Use for up to 1 week. Avoid applying to face, groin, and axilla. Use as directed. Long-term use can cause thinning of the skin.  Topical steroids (such as triamcinolone, fluocinolone, fluocinonide, mometasone, clobetasol, halobetasol, betamethasone, hydrocortisone) can cause thinning and lightening of the skin if they are used for too long in the same area. Your physician has selected the right strength medicine for your problem and area affected on the body. Please use your medication only as directed by your physician to prevent side effects.    triamcinolone (KENALOG) 0.025 % ointment - chest and shoulders Apply 1  Application topically 2 (two) times daily. Apply to affected areas of rash for up to 1 week. Avoid applying to face, groin, and axilla. Use as directed.  Actinic Damage - Severe, confluent actinic changes with pre-cancerous actinic keratoses  - Severe, chronic, not at goal, secondary to cumulative UV radiation exposure over time - diffuse scaly erythematous macules and papules with underlying dyspigmentation - Discussed Prescription "Field Treatment" for Severe, Chronic Confluent Actinic Changes with Pre-Cancerous Actinic Keratoses Field treatment involves treatment of an entire area of skin that has confluent Actinic Changes (Sun/ Ultraviolet light damage) and PreCancerous Actinic Keratoses by method of PhotoDynamic Therapy (PDT) and/or prescription Topical Chemotherapy agents such as 5-fluorouracil, 5-fluorouracil/calcipotriene, and/or imiquimod.  The purpose is to decrease the number of clinically evident and subclinical PreCancerous lesions to prevent progression to development of skin cancer by chemically destroying early precancer changes that may or may not be visible.  It has been shown to reduce the risk of developing skin cancer in the treated area. As a result of treatment, redness, scaling, crusting, and open sores may occur during treatment course. One or more than one of these methods may be used and may have to be used several times to control, suppress and eliminate the PreCancerous changes. Discussed treatment course, expected reaction, and possible side effects. - Recommend daily broad spectrum sunscreen SPF 30+ to sun-exposed areas, reapply every 2 hours as needed.  - Staying in the shade or wearing long sleeves, sun glasses (UVA+UVB protection) and wide brim hats (4-inch brim around the entire  circumference of the hat) are also recommended. - Call for new or changing lesions.  - Once completely healed at chest can retreat with cream to address residual damage.  - Start  5-fluorouracil/calcipotriene cream twice a day for up to 7 days to affected areas including chest and shoulders.  Stop once red and crusty even if this is sooner than 7 days.   Return for keep follow up as scheduled . I, Ruthell Rummage, CMA, am acting as scribe for Forest Gleason, MD.  Documentation: I have reviewed the above documentation for accuracy and completeness, and I agree with the above.  Forest Gleason, MD

## 2022-07-30 ENCOUNTER — Telehealth: Payer: Self-pay

## 2022-07-30 NOTE — Telephone Encounter (Signed)
Prior authorization was initiated via CoverMyMeds for prescription Fexofenadine HCI 180 MG tablets. Patient was denied as medication is not covered under patient Medicare Part D coverage.

## 2022-07-30 NOTE — Telephone Encounter (Signed)
Spoke with patient and notified him of recent recommendations. Patient says he no longer uses the medication.

## 2022-07-30 NOTE — Telephone Encounter (Signed)
PA for [name] has been denied, Medicare does not cover OTC medications

## 2022-08-05 DIAGNOSIS — F40240 Claustrophobia: Secondary | ICD-10-CM | POA: Insufficient documentation

## 2022-08-07 ENCOUNTER — Inpatient Hospital Stay: Payer: Medicare HMO | Attending: Oncology

## 2022-08-07 ENCOUNTER — Inpatient Hospital Stay (HOSPITAL_BASED_OUTPATIENT_CLINIC_OR_DEPARTMENT_OTHER): Payer: Medicare HMO | Admitting: Oncology

## 2022-08-07 ENCOUNTER — Encounter: Payer: Self-pay | Admitting: Oncology

## 2022-08-07 VITALS — BP 106/81 | HR 78 | Resp 20 | Wt 237.7 lb

## 2022-08-07 DIAGNOSIS — E871 Hypo-osmolality and hyponatremia: Secondary | ICD-10-CM | POA: Insufficient documentation

## 2022-08-07 DIAGNOSIS — Z8572 Personal history of non-Hodgkin lymphomas: Secondary | ICD-10-CM

## 2022-08-07 DIAGNOSIS — Z08 Encounter for follow-up examination after completed treatment for malignant neoplasm: Secondary | ICD-10-CM

## 2022-08-07 DIAGNOSIS — F17210 Nicotine dependence, cigarettes, uncomplicated: Secondary | ICD-10-CM | POA: Diagnosis not present

## 2022-08-07 DIAGNOSIS — R7989 Other specified abnormal findings of blood chemistry: Secondary | ICD-10-CM | POA: Diagnosis not present

## 2022-08-07 LAB — CBC WITH DIFFERENTIAL/PLATELET
Abs Immature Granulocytes: 0.19 10*3/uL — ABNORMAL HIGH (ref 0.00–0.07)
Basophils Absolute: 0.1 10*3/uL (ref 0.0–0.1)
Basophils Relative: 1 %
Eosinophils Absolute: 0.1 10*3/uL (ref 0.0–0.5)
Eosinophils Relative: 2 %
HCT: 40.2 % (ref 39.0–52.0)
Hemoglobin: 14.5 g/dL (ref 13.0–17.0)
Immature Granulocytes: 4 %
Lymphocytes Relative: 35 %
Lymphs Abs: 1.9 10*3/uL (ref 0.7–4.0)
MCH: 32.4 pg (ref 26.0–34.0)
MCHC: 36.1 g/dL — ABNORMAL HIGH (ref 30.0–36.0)
MCV: 89.9 fL (ref 80.0–100.0)
Monocytes Absolute: 0.9 10*3/uL (ref 0.1–1.0)
Monocytes Relative: 16 %
Neutro Abs: 2.3 10*3/uL (ref 1.7–7.7)
Neutrophils Relative %: 42 %
Platelets: 180 10*3/uL (ref 150–400)
RBC: 4.47 MIL/uL (ref 4.22–5.81)
RDW: 12.7 % (ref 11.5–15.5)
WBC: 5.4 10*3/uL (ref 4.0–10.5)
nRBC: 0 % (ref 0.0–0.2)

## 2022-08-07 LAB — COMPREHENSIVE METABOLIC PANEL
ALT: 49 U/L — ABNORMAL HIGH (ref 0–44)
AST: 53 U/L — ABNORMAL HIGH (ref 15–41)
Albumin: 4.2 g/dL (ref 3.5–5.0)
Alkaline Phosphatase: 225 U/L — ABNORMAL HIGH (ref 38–126)
Anion gap: 10 (ref 5–15)
BUN: 12 mg/dL (ref 6–20)
CO2: 22 mmol/L (ref 22–32)
Calcium: 8.8 mg/dL — ABNORMAL LOW (ref 8.9–10.3)
Chloride: 96 mmol/L — ABNORMAL LOW (ref 98–111)
Creatinine, Ser: 0.95 mg/dL (ref 0.61–1.24)
GFR, Estimated: >60 mL/min (ref >60)
Glucose, Bld: 119 mg/dL — ABNORMAL HIGH (ref 70–99)
Potassium: 4.4 mmol/L (ref 3.5–5.1)
Sodium: 128 mmol/L — ABNORMAL LOW (ref 135–145)
Total Bilirubin: 0.6 mg/dL (ref 0.3–1.2)
Total Protein: 6.7 g/dL (ref 6.5–8.1)

## 2022-08-07 LAB — LACTATE DEHYDROGENASE: LDH: 158 U/L (ref 98–192)

## 2022-08-07 MED ORDER — SODIUM CHLORIDE 0.9% FLUSH
10.0000 mL | INTRAVENOUS | Status: DC | PRN
  Administered 2022-08-07: 10 mL via INTRAVENOUS
  Filled 2022-08-07: qty 10

## 2022-08-07 MED ORDER — HEPARIN SOD (PORK) LOCK FLUSH 100 UNIT/ML IV SOLN
INTRAVENOUS | Status: AC
  Administered 2022-08-07: 500 [IU] via INTRAVENOUS
  Filled 2022-08-07: qty 5

## 2022-08-07 MED ORDER — HEPARIN SOD (PORK) LOCK FLUSH 100 UNIT/ML IV SOLN
500.0000 [IU] | Freq: Once | INTRAVENOUS | Status: AC
  Filled 2022-08-07: qty 5

## 2022-08-07 NOTE — Progress Notes (Signed)
Hematology/Oncology Consult note 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 [name]  He sustained a left anterior frontal sinus as well as supraorbital rim fracture on 11/21/2019 which was managed conservatively.  He then presented to the ER at [facility] with symptoms of right-sided chest wall pain this led to a CT PE which did not show any evidence of pulmonary embolism.  He was noted to have bilateral symmetric axillary adenopathy measuring up to 1.8 cm.  Scattered mediastinal adenopathy.  Right paratracheal node measuring up to 1.2 cm.  Prominent right costophrenic lymph node measuring up to 1 cm.  Findings are nonspecific but could be seen with lymphoma versus systemic inflammatory disease such as sarcoidosis.  Of note patient has had a prior CT scan for lung screening back in 2015 when he was not noted to have this adenopathy.    Repeat CT chest in August 2021 showed persistent mild nonbulky axillary and mediastinal adenopathy which had not changed significantly as compared to his prior CT in December 2020.  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   Repeat CT in March 2022 showed Progression and multistation adenopathy as well as significant splenomegaly measuring 20.4 cm as compared to 14.5 cm on CT scan September  2021.  CT scan showed multistation adenopathy with SUVs ranging between 6-8.9.  Patient underwent another core biopsy of right inguinal lymph node which was consistent with follicular lymphoma grade 1-2.  Bone marrow biopsy also showed moderate involvement with non-Hodgkin's B-cell lymphoma.  Baseline hepatitis B testing showed core antibody positivity.  Seen by GI and started on tenofovir   Patient had symptoms of nausea and sweating during cycle 1 of Rituxan which resolved with additional premedications.  He developed symptoms of itching over neck chest back and bilateral eyes with second dose of Rituxan early and had to get more premedications and was not rechallenged on the same day.  Patient subsequently received cycle 3 of Bendamustine Rituxan chemotherapy after Rituxan was given as a split dose over 3 days   Scans after 4 cycles of Bendamustine Rituxan chemotherapy showedSignificant improvement with therapy.  Lymphadenopathy in his neck and mediastinum resolved Deauville 2.  Decrease in the splenic size and hypermetabolism.  No new lesions.  FDG accumulation within the skeleton presumably due to stimulatory effects of treatment.      Interval history-patient recently tripped over his dog and fractured his left fibula which is under cast.  Continues to drink alcohol on a regular basis.  ECOG PS- 1 Pain scale- 0   Review of systems- Review of Systems  Constitutional:  Negative for chills, fever, malaise/fatigue and weight loss.  HENT:  Negative for congestion, ear discharge and nosebleeds.   Eyes:  Negative for blurred vision.  Respiratory:  Negative for cough, hemoptysis, sputum production, shortness of breath and wheezing.   Cardiovascular:  Negative for chest pain, palpitations, orthopnea  and claudication.  Gastrointestinal:  Negative for abdominal pain, blood in stool, constipation, diarrhea, heartburn, melena, nausea and vomiting.  Genitourinary:  Negative for dysuria, flank pain,  frequency, hematuria and urgency.  Musculoskeletal:  Negative for back pain, joint pain and myalgias.  Skin:  Negative for rash.  Neurological:  Negative for dizziness, tingling, focal weakness, seizures, weakness and headaches.  Endo/Heme/Allergies:  Does not bruise/bleed easily.  Psychiatric/Behavioral:  Negative for depression and suicidal ideas. The patient does not have insomnia.       Allergies  Allergen Reactions   Penicillins Anaphylaxis    Tolerates cefdinir, cephalexin and amoxicillin/clavulonate so true PCN allergy unlikely   Tetanus Toxoids Swelling   Lisinopril Cough   Losartan      Other reaction(s): Muscle Pain     Tetanus Toxoid    Codeine Nausea Only and Nausea And Vomiting   Doxycycline Rash   Ruxience [Rituximab-Pvvr] Rash    05/06/21 pt w/ worsening rash during Ruxience infusion.  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[place])    Arthritis    Asthma    Basal cell carcinoma 01/28/2021   R upper arm, [facility] 03/04/2021   Brain 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   Chronic pain    right arm   Depression    Environmental and seasonal allergies    Follicular lymphoma ([place])    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   left neck, Moh's 05/29/21   Hypertension    Pneumonia    SCC (squamous cell carcinoma) 08/28/2021   upper chest right of midline, EDC done 09/17/2021   Sleep apnea    does not wear CPAP ; uses humidifier instead   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   right upper back, ED&C 06/18/2022   Squamous cell carcinoma of skin 02/19/2021   L inferior mandible, treated with EDC   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/28/2021   R ant shoulder - ED&C   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/28/2021   L upper abdomen - ED&C     Past Surgical History:  Procedure Laterality Date   ANTERIOR CERVICAL DECOMP/DISCECTOMY FUSION N/A 06/17/2016    Procedure: ANTERIOR CERVICAL DECOMPRESSION FUSION, CERVICAL 3-4, CERVICAL 4-5 WITH INSTRUMENTATION AND ALLOGRAFT;  Surgeon: Phylliss Bob, MD;  Location: [facility];  Service: Orthopedics;  Laterality: N/A;  ANTERIOR CERVICAL DECOMPRESSION FUSION, CERVICAL 3-4, CERVICAL 4-5 WITH INSTRUMENTATION AND ALLOGRAFT   BACK SURGERY     NECK SURGERY  2009   PORTA CATH INSERTION N/A 04/03/2021   Procedure: PORTA CATH INSERTION;  Surgeon: Algernon Huxley, MD;  Location: [facility];  Service: Cardiovascular;  Laterality: N/A;    Social History   Socioeconomic History   Marital status: Divorced    Spouse name: Not on file   Number of children: Not on file   Years of education: Not on file   Highest education level: Not on file  Occupational History   Not on file  Tobacco Use   Smoking status: Every Day    Types: Cigars   Smokeless tobacco: Never   Tobacco comments:    8 a day  Vaping Use   Vaping Use: Former   Start date: 04/30/2017   Quit date: 11/30/2017  Substance and Sexual Activity   Alcohol use: Yes    Comment: beers 12 a day   Drug use: No  Sexual activity: Not Currently  Other Topics Concern   Not on file  Social History Narrative   Not on file   Social Determinants of Health   Financial Resource Strain: High Risk (10/27/2021)   Overall Financial Resource Strain (CARDIA)    Difficulty of Paying Living Expenses: Hard  Food Insecurity: Food Insecurity Present (10/27/2021)   Hunger Vital Sign    Worried About Running Out of Food in the Last Year: Often true    Ran Out of Food in the Last Year: Often true  Transportation Needs: No Transportation Needs (10/27/2021)   PRAPARE - Hydrologist (Medical): No    Lack of Transportation (Non-Medical): No  Physical Activity: Inactive (10/27/2021)   Exercise Vital Sign    Days of Exercise per Week: 0 days    Minutes of Exercise per Session: 0 min  Stress: No Stress Concern Present (10/27/2021)   [place]    Feeling of Stress : Only a little  Social Connections: Socially Isolated (10/27/2021)   Social Connection and Isolation Panel [NHANES]    Frequency of Communication with Friends and Family: More than three times a week    Frequency of Social Gatherings with Friends and Family: More than three times a week    Attends Religious Services: Never    Marine scientist or Organizations: No    Attends Archivist Meetings: Never    Marital Status: Divorced  Human resources officer Violence: Not At Risk (10/27/2021)   Humiliation, Afraid, Rape, and Kick questionnaire    Fear of Current or Ex-Partner: No    Emotionally Abused: No    Physically Abused: No    Sexually Abused: No    Family History  Problem Relation Age of Onset   Diabetes Mother    Heart attack Father    Emphysema Father    Diabetes Other      Current Outpatient Medications:    albuterol (VENTOLIN HFA) 108 (90 Base) MCG/ACT inhaler, Inhale 2 puffs into the lungs every 6 (six) hours as needed for wheezing or shortness of breath., Disp: 8 g, Rfl: 2   allopurinol (ZYLOPRIM) 300 MG tablet, TAKE 1 TABLET BY MOUTH ONCE DAILY, Disp: 30 tablet, Rfl: 3   amLODipine (NORVASC) 5 MG tablet, Take 1 tablet by mouth daily., Disp: , Rfl:    atorvastatin (LIPITOR) 80 MG tablet, Take 1 tablet (80 mg total) by mouth daily., Disp: 90 tablet, Rfl: 3   buPROPion (WELLBUTRIN XL) 300 MG 24 hr tablet, Take 1 tablet (300 mg total) by mouth daily., Disp: 30 tablet, Rfl: 4   diazepam (VALIUM) 5 MG tablet, Take 5 mg by mouth daily as needed., Disp: , Rfl:    DULoxetine (CYMBALTA) 60 MG capsule, Take 60 mg by mouth daily., Disp: , Rfl:    famotidine (PEPCID) 20 MG tablet, TAKE 1 TABLET TWICE DAILY, Disp: 180 tablet, Rfl: 3   fexofenadine (ALLEGRA ALLERGY) 180 MG tablet, Take 1 tablet (180 mg total) by mouth daily., Disp: 10 tablet, Rfl: 1   finasteride (PROSCAR) 5 MG tablet,  Take 1 tablet (5 mg total) by mouth daily., Disp: 90 tablet, Rfl: 2   metoprolol succinate (TOPROL-XL) 50 MG 24 hr tablet, Take 2 tablets (100 mg total) by mouth daily. Take with or immediately following a meal., Disp: 90 tablet, Rfl: 0   mirtazapine (REMERON) 15 MG tablet, Take 15 mg by mouth at bedtime., Disp: , Rfl:  montelukast (SINGULAIR) 10 MG tablet, TAKE 1 TABLET AT BEDTIME, Disp: 90 tablet, Rfl: 3   OLANZapine (ZYPREXA) 5 MG tablet, Take 5 mg by mouth at bedtime., Disp: , Rfl:    sildenafil (REVATIO) 20 MG tablet, TAKE 3-5 TABLETS BY MOUTH ONCE DAILY AS NEEDED FOR ERECTILE DYSFUNCTION, Disp: 30 tablet, Rfl: 0   SPIRIVA RESPIMAT 2.5 MCG/ACT AERS, INHALE 2 PUFFS INTO THE LUNGS ONCE DAILY. *RINSE MOUTH AFTER USE*, Disp: 4 g, Rfl: 1   tenofovir (VIREAD) 300 MG tablet, Take 300 mg by mouth daily., Disp: , Rfl:    testosterone cypionate (DEPOTESTOSTERONE CYPIONATE) 200 MG/ML injection, Inject 1 mL (200 mg total) into the muscle every 14 (fourteen) days., Disp: 10 mL, Rfl: 0   acetaminophen (TYLENOL) 325 MG tablet, Take 2 tablets (650 mg total) by mouth daily. for 5 days starting 2 days prior to treatment (Patient not taking: Reported on 08/07/2022), Disp: 30 tablet, Rfl: 0   AMBULATORY NON FORMULARY MEDICATION, Trimix (30/1/10)-(Pap/Phent/PGE)  Test Dose  77ml vial   Qty #3 Refills 0  [facility] [phone] Fax [phone] (Patient not taking: Reported on 04/01/2022), Disp: 3 mL, Rfl: 0   calcipotriene (DOVONOX) 0.005 % cream, Apply topically 2 (two) times daily. Apply twice a day after fluorouracil to affected areas as directed in handout. (Patient not taking: Reported on 08/07/2022), Disp: 60 g, Rfl: 1   celecoxib (CELEBREX) 200 MG capsule, every 12 (twelve) hours (Patient not taking: Reported on 08/07/2022), Disp: , Rfl:    clonazePAM (KLONOPIN) 0.5 MG tablet, Take 0.5 mg by mouth daily. (Patient not taking: Reported on 08/07/2022), Disp: , Rfl:    fluorouracil (EFUDEX) 5 % cream, Apply  topically 2 (two) times daily. Apply twice a day to affected areas as directed in handout. (Patient not taking: Reported on 08/07/2022), Disp: 40 g, Rfl: 1   lansoprazole (PREVACID) 30 MG capsule, TAKE 1 CAPSULE BY MOUTH ONCE DAILY AT NOON (Patient not taking: Reported on 08/07/2022), Disp: 30 capsule, Rfl: 2   modafinil (PROVIGIL) 200 MG tablet, Take 1 tablet by mouth daily. (Patient not taking: Reported on 08/07/2022), Disp: , Rfl:    mupirocin ointment (BACTROBAN) 2 %, Apply 1 Application topically daily. Apply to any open wounds until healed. (Patient not taking: Reported on 08/07/2022), Disp: 22 g, Rfl: 3   Nicotine (NICOTROL NS) 10 MG/ML SOLN, 2 sprays daily. (Patient not taking: Reported on 08/07/2022), Disp: , Rfl:    traMADol (ULTRAM) 50 MG tablet, Take 1 tablet by mouth daily. (Patient not taking: Reported on 08/07/2022), Disp: , Rfl:    triamcinolone (KENALOG) 0.025 % ointment, Apply 1 Application topically 2 (two) times daily. Apply to affected areas of rash for up to 1 week. Avoid applying to face, groin, and axilla. Use as directed. (Patient not taking: Reported on 08/07/2022), Disp: 30 g, Rfl: 0 No current facility-administered medications for this visit.  Facility-Administered Medications Ordered in Other Visits:    heparin lock flush 100 unit/mL, 500 Units, Intravenous, Once, Creig Hines, MD   heparin lock flush 100 unit/mL, 500 Units, Intravenous, Once, Creig Hines, MD   sodium chloride flush (NS) 0.9 % injection 10 mL, 10 mL, Intravenous, PRN, Creig Hines, MD  Physical exam:  Vitals:   08/07/22 1439  BP: 106/81  Pulse: 78  Resp: 20  SpO2: 97%  Weight: 237 lb 11.2 oz (107.8 kg)   Physical Exam Constitutional:      General: He is not in acute distress. Cardiovascular:  Rate and Rhythm: Normal rate and regular rhythm.     Heart sounds: Normal heart sounds.  Pulmonary:     Effort: Pulmonary effort is normal.     Breath sounds: Normal breath sounds.  Abdominal:      General: Bowel sounds are normal.     Palpations: Abdomen is soft.     Comments: No palpable hepatosplenomegaly  Musculoskeletal:     Comments: Cast in place over left lower extremity  Lymphadenopathy:     Comments: No palpable cervical, supraclavicular, axillary or inguinal adenopathy    Skin:    General: Skin is warm and dry.  Neurological:     Mental Status: He is alert and oriented to person, place, and time.         Latest Ref Rng & Units 08/07/2022    2:06 PM  CMP  Glucose 70 - 99 mg/dL 119   BUN 6 - 20 mg/dL 12   Creatinine 0.61 - 1.24 mg/dL 0.95   Sodium 135 - 145 mmol/L 128   Potassium 3.5 - 5.1 mmol/L 4.4   Chloride 98 - 111 mmol/L 96   CO2 22 - 32 mmol/L 22   Calcium 8.9 - 10.3 mg/dL 8.8   Total Protein 6.5 - 8.1 g/dL 6.7   Total Bilirubin 0.3 - 1.2 mg/dL 0.6   Alkaline Phos 38 - 126 U/L 225   AST 15 - 41 U/L 53   ALT 0 - 44 U/L 49       Latest Ref Rng & Units 08/07/2022    2:06 PM  CBC  WBC 4.0 - 10.5 K/uL 5.4   Hemoglobin 13.0 - 17.0 g/dL 14.5   Hematocrit 39.0 - 52.0 % 40.2   Platelets 150 - 400 K/uL 180      Assessment and plan- Patient is a 61 y.o. male  with history of stage IV grade 1 follicular lymphoma s/p 6 cycles of Bendamustine Rituxan chemotherapy.  He is here for routine follow-up  Clinically patient is doing well with no concerning signs and symptoms of recurrence based on today's exam.  He was found to have nonspecific bilateral lung nodules for which I had referred him to pulmonary.  He missed that appointment and has an appointment coming up in 2 weeks.  I will see him back in 3 to 4 months time with repeat CT chest abdomen and pelvis with contrast for continued surveillance of his follicular lymphoma.  Hyponatremia and abnormal LFTs: Likely secondary to ongoing alcohol dependence.  Continue to monitor   Visit Diagnosis 1. Encounter for follow-up surveillance of lymphoma      Dr. Randa Evens, MD, MPH [facility] [phone] 08/07/2022 4:26 PM

## 2022-08-07 NOTE — Progress Notes (Signed)
Pt states that he tripped over his dog in the middle of the night in July and fractured his leg and ankle. Pt feels somewhat depressed due to not being able to do what he usually does due to leg pain. Possible surgery waiting to hear back for a MRI appt.

## 2022-08-10 ENCOUNTER — Encounter: Payer: Self-pay | Admitting: Dermatology

## 2022-08-11 ENCOUNTER — Ambulatory Visit (INDEPENDENT_AMBULATORY_CARE_PROVIDER_SITE_OTHER): Payer: Medicare HMO | Admitting: Unknown Physician Specialty

## 2022-08-11 ENCOUNTER — Encounter: Payer: Self-pay | Admitting: Unknown Physician Specialty

## 2022-08-11 DIAGNOSIS — E785 Hyperlipidemia, unspecified: Secondary | ICD-10-CM

## 2022-08-11 DIAGNOSIS — F3160 Bipolar disorder, current episode mixed, unspecified: Secondary | ICD-10-CM

## 2022-08-11 DIAGNOSIS — I10 Essential (primary) hypertension: Secondary | ICD-10-CM | POA: Diagnosis not present

## 2022-08-11 MED ORDER — AMLODIPINE BESYLATE 5 MG PO TABS: 5.0000 mg | ORAL_TABLET | Freq: Every day | ORAL | 1 refills | Status: DC

## 2022-08-11 MED ORDER — METOPROLOL SUCCINATE ER 50 MG PO TB24: 100.0000 mg | ORAL_TABLET | Freq: Every day | ORAL | 1 refills | Status: DC

## 2022-08-11 NOTE — Assessment & Plan Note (Signed)
Treatment through psychiatry

## 2022-08-11 NOTE — Progress Notes (Signed)
BP (!) 132/90   Pulse 73   Temp 97.9 F (36.6 C) (Oral)   SpO2 96%    Subjective:    Patient ID: Russell Simpson, male    DOB: 03-21-1961, 61 y.o.   MRN: [id number]  HPI: Russell Simpson is a 61 y.o. male  Chief Complaint  Patient presents with   Hypertension   Pt is here with a boot on and walking with a cane.  About 2 weeks s/p ankle fracture.  Under care of [facility].  Not painful walking on it, but a great deal of pain at night.  Having trouble controlling it with Tramadol  Hypertension Using medications without difficulty Average home BPs not checkng   No problems or lightheadedness No chest pain with exertion or shortness of breath No Edema   Hyperlipidemia Using medications without problems: Review of notes show Atorvastatin increased to 80 mg No Muscle aches  Diet compliance: Eating a lot of fast food Exercise:Fx leg and not exercising      08/11/2022    2:47 PM 04/07/2022    2:13 PM 10/27/2021   12:15 PM 08/25/2021    1:21 PM 06/04/2021    1:59 PM  Depression screen PHQ 2/9  Decreased Interest '2 3 3 3 3  '$ Down, Depressed, Hopeless '2 1 3 '$ 0 0  PHQ - 2 Score '4 4 6 3 3  '$ Altered sleeping '2 1 2 3 1  '$ Tired, decreased energy '2 1 2 3 3  '$ Change in appetite 2 0 0 0 3  Feeling bad or failure about yourself  2 0 0 0 0  Trouble concentrating 0 0 0 0 0  Moving slowly or fidgety/restless 0 0 0 0 1  Suicidal thoughts 0 0 0 0 0  PHQ-9 Score '12 6 10 9 11  '$ Difficult doing work/chores  Not difficult at all Somewhat difficult Not difficult at all      Relevant past medical, surgical, family and social history reviewed and updated as indicated. Interim medical history since our last visit reviewed. Allergies and medications reviewed and updated.  Review of Systems  Per HPI unless specifically indicated above     Objective:    BP (!) 132/90   Pulse 73   Temp 97.9 F (36.6 C) (Oral)   SpO2 96%   Wt Readings from Last 3 Encounters:  08/07/22 237 lb 11.2 oz  (107.8 kg)  04/07/22 233 lb 3.2 oz (105.8 kg)  04/01/22 232 lb 3.2 oz (105.3 kg)    Physical Exam Constitutional:      General: He is not in acute distress.    Appearance: Normal appearance. He is well-developed.  HENT:     Head: Normocephalic and atraumatic.  Eyes:     General: Lids are normal. No scleral icterus.       Right eye: No discharge.        Left eye: No discharge.     Conjunctiva/sclera: Conjunctivae normal.  Neck:     Vascular: No carotid bruit or JVD.  Cardiovascular:     Rate and Rhythm: Normal rate and regular rhythm.     Heart sounds: Normal heart sounds.  Pulmonary:     Effort: Pulmonary effort is normal. No respiratory distress.     Breath sounds: Normal breath sounds.  Abdominal:     Palpations: There is no hepatomegaly or splenomegaly.  Musculoskeletal:        General: Normal range of motion.     Cervical back: Normal range of  motion and neck supple.  Skin:    General: Skin is warm and dry.     Coloration: Skin is not pale.     Findings: No rash.  Neurological:     Mental Status: He is alert and oriented to person, place, and time.  Psychiatric:        Behavior: Behavior normal.        Thought Content: Thought content normal.        Judgment: Judgment normal.     Results for orders placed or performed in visit on 08/07/22  Lactate dehydrogenase  Result Value Ref Range   LDH 158 98 - 192 U/L  Comprehensive metabolic panel  Result Value Ref Range   Sodium 128 (L) 135 - 145 mmol/L   Potassium 4.4 3.5 - 5.1 mmol/L   Chloride 96 (L) 98 - 111 mmol/L   CO2 22 22 - 32 mmol/L   Glucose, Bld 119 (H) 70 - 99 mg/dL   BUN 12 6 - 20 mg/dL   Creatinine, Ser 0.95 0.61 - 1.24 mg/dL   Calcium 8.8 (L) 8.9 - 10.3 mg/dL   Total Protein 6.7 6.5 - 8.1 g/dL   Albumin 4.2 3.5 - 5.0 g/dL   AST 53 (H) 15 - 41 U/L   ALT 49 (H) 0 - 44 U/L   Alkaline Phosphatase 225 (H) 38 - 126 U/L   Total Bilirubin 0.6 0.3 - 1.2 mg/dL   GFR, Estimated >60 >60 mL/min   Anion gap  10 5 - 15  CBC with Differential/Platelet  Result Value Ref Range   WBC 5.4 4.0 - 10.5 K/uL   RBC 4.47 4.22 - 5.81 MIL/uL   Hemoglobin 14.5 13.0 - 17.0 g/dL   HCT 40.2 39.0 - 52.0 %   MCV 89.9 80.0 - 100.0 fL   MCH 32.4 26.0 - 34.0 pg   MCHC 36.1 (H) 30.0 - 36.0 g/dL   RDW 12.7 11.5 - 15.5 %   Platelets 180 150 - 400 K/uL   nRBC 0.0 0.0 - 0.2 %   Neutrophils Relative % 42 %   Neutro Abs 2.3 1.7 - 7.7 K/uL   Lymphocytes Relative 35 %   Lymphs Abs 1.9 0.7 - 4.0 K/uL   Monocytes Relative 16 %   Monocytes Absolute 0.9 0.1 - 1.0 K/uL   Eosinophils Relative 2 %   Eosinophils Absolute 0.1 0.0 - 0.5 K/uL   Basophils Relative 1 %   Basophils Absolute 0.1 0.0 - 0.1 K/uL   Immature Granulocytes 4 %   Abs Immature Granulocytes 0.19 (H) 0.00 - 0.07 K/uL      Assessment & Plan:   Problem List Items Addressed This Visit       Unprioritized   BP (high blood pressure)    Stable, continue present medications.        Relevant Medications   metoprolol succinate (TOPROL-XL) 50 MG 24 hr tablet   amLODipine (NORVASC) 5 MG tablet   Hyperlipidemia    On 80 mg Atorvastatin.  Continue present medications.       Relevant Medications   metoprolol succinate (TOPROL-XL) 50 MG 24 hr tablet   amLODipine (NORVASC) 5 MG tablet   Mixed bipolar I disorder ([place])    Treatment through psychiatry       Gets labs done at cancer center on a regular basis.  Defer labs today  Follow up plan: Return in about 6 months (around 02/09/2023).

## 2022-08-11 NOTE — Assessment & Plan Note (Signed)
Stable, continue present medications.   

## 2022-08-11 NOTE — Assessment & Plan Note (Signed)
On 80 mg Atorvastatin.  Continue present medications.

## 2022-08-12 ENCOUNTER — Other Ambulatory Visit: Payer: Self-pay | Admitting: Orthopedic Surgery

## 2022-08-12 DIAGNOSIS — M25572 Pain in left ankle and joints of left foot: Secondary | ICD-10-CM

## 2022-08-14 IMAGING — CT NM PET TUM IMG RESTAG (PS) SKULL BASE T - THIGH
9 series · 21 of 25 positions shown · non-contrast
Comparison: 03/10/2021

CLINICAL DATA: Subsequent treatment strategy for non-Hodgkin's
lymphoma.

EXAM:
NUCLEAR MEDICINE PET SKULL BASE TO THIGH
TECHNIQUE: 12.0 mCi F-18 FDG was injected intravenously. Full-ring PET imaging
was performed from the skull base to thigh after the radiotracer. CT
data was obtained and used for attenuation correction and anatomic
localization.
Fasting blood glucose: 139 mg/dl

[Series 3: ct wb 5.0 b30f · axial · 5.0mm · 0.98mm/px · z∈[-280,+704]mm · 4 of 329 slices shown]
[im 1/329]
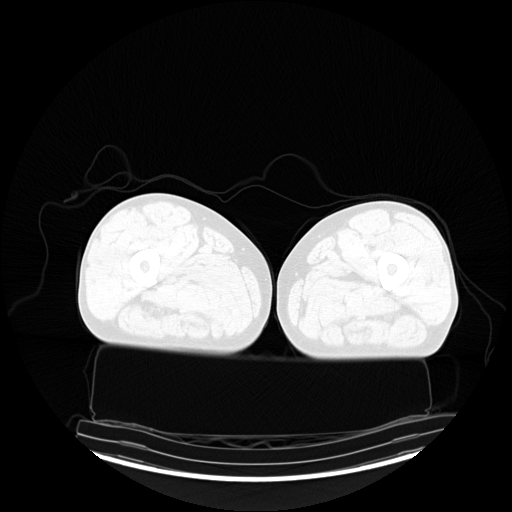
[im 83/329]
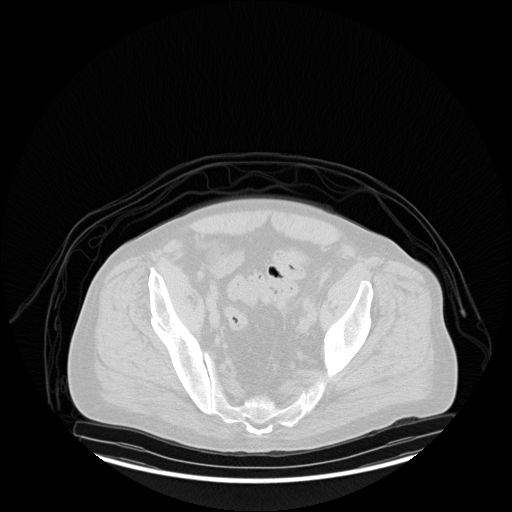
[im 165/329]
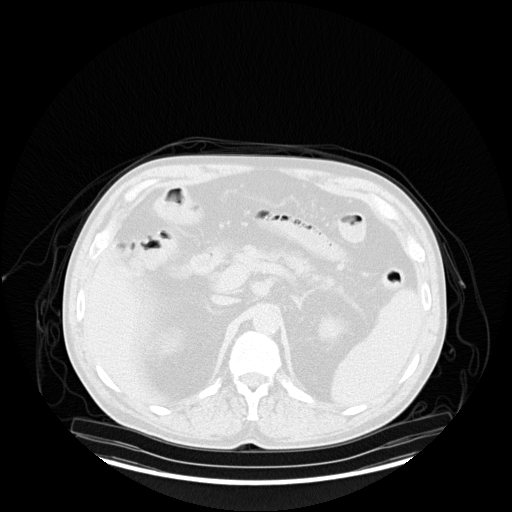
[im 329/329  brain]
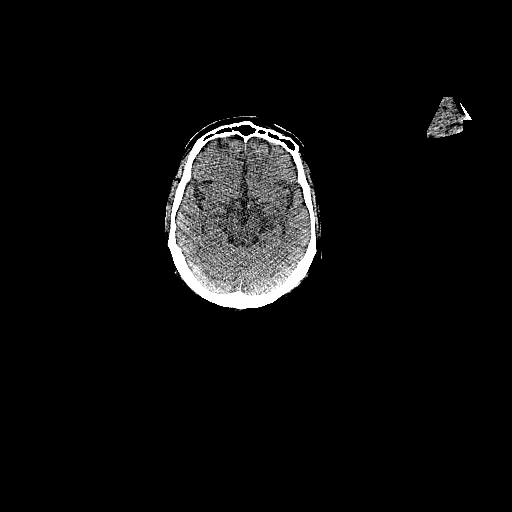

[Series 5: pet wb uncorrected (nac) · axial · 5.0mm · 4.07mm/px · z∈[-280,+704]mm · 4 of 329 slices shown]
[im 1/329]
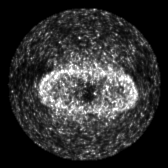
[im 110/329]
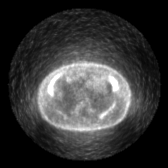
[im 219/329]
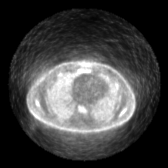
[im 329/329]
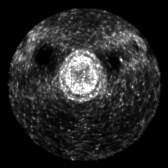

[Series 603: pet_ct axial fused · 3 of 326 slices shown]
[im 109/326]
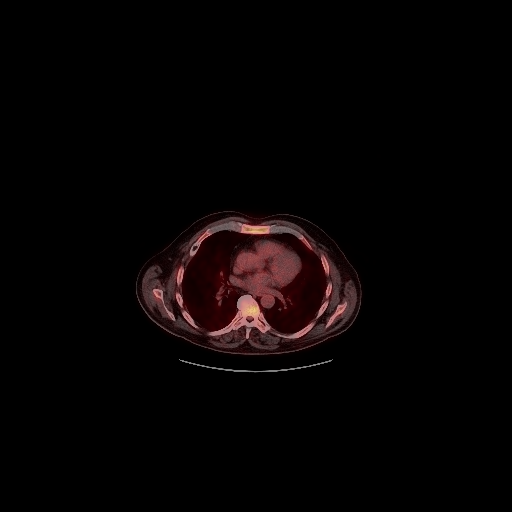
[im 217/326]
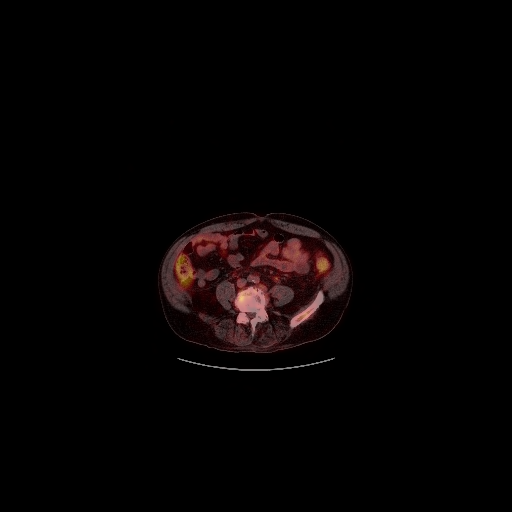
[im 326/326]
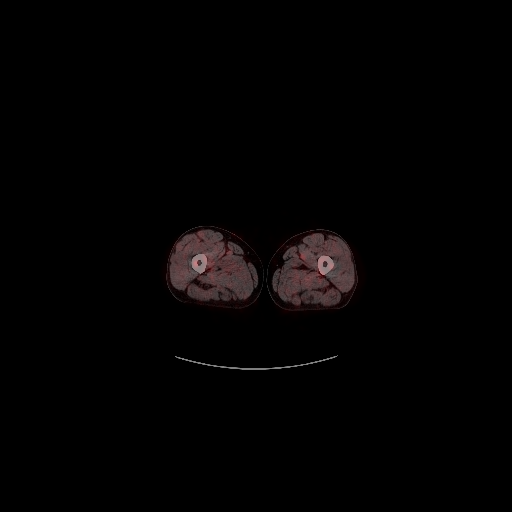

[Series 604: pet_ct coronal fused · 1 of 86 slices shown]
[im 1/86]
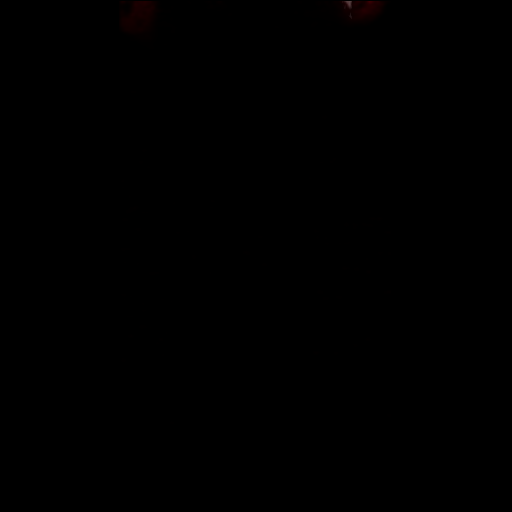

[Series 605: pet_ct sagittal fused · 1 of 173 slices shown]
[im 1/173]
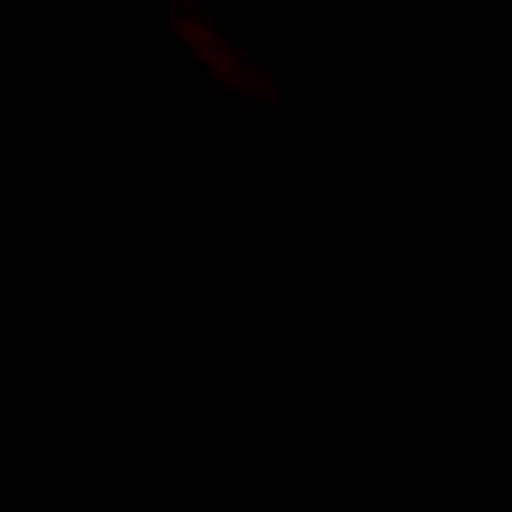

[Series 606: pet axial · 4 of 327 slices shown]
[im 1/327]
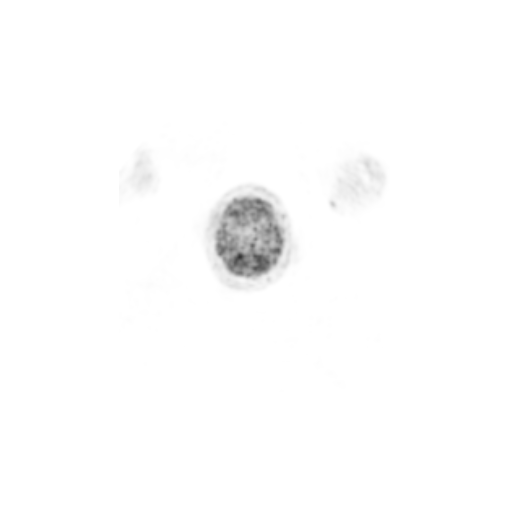
[im 109/327]
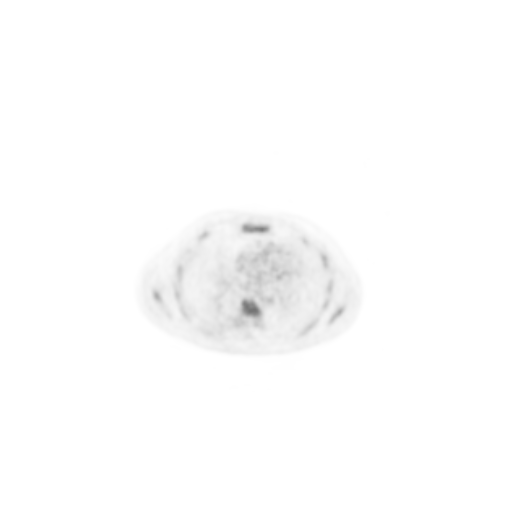
[im 218/327]
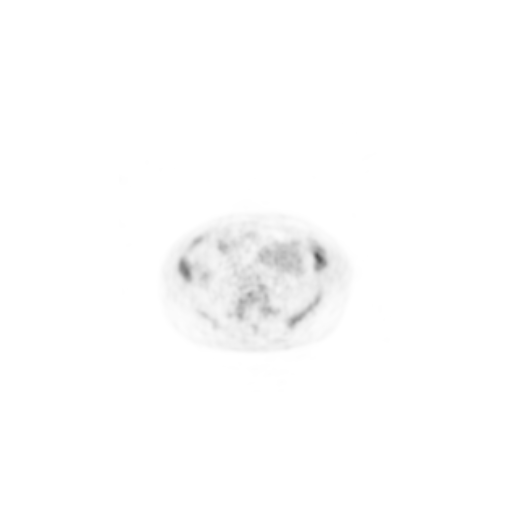
[im 327/327]
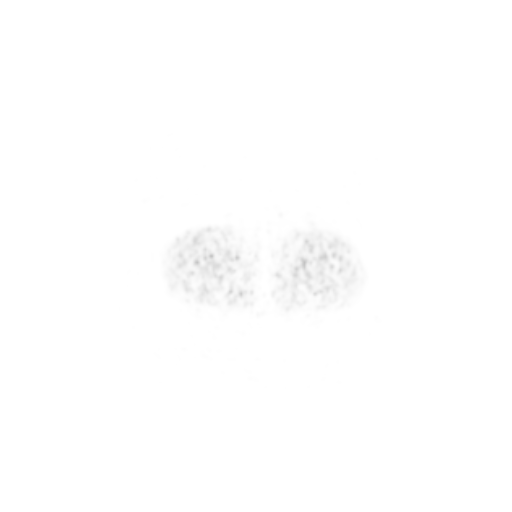

[Series 607: pet coronal · 1 of 134 slices shown]
[im 1/134]
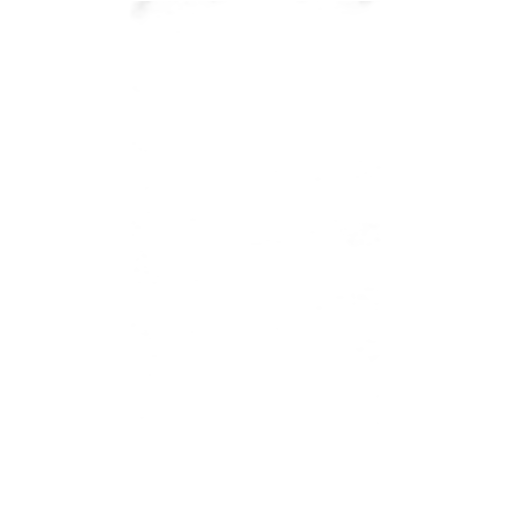

[Series 608: pet sagittal · 2 of 176 slices shown]
[im 1/176]
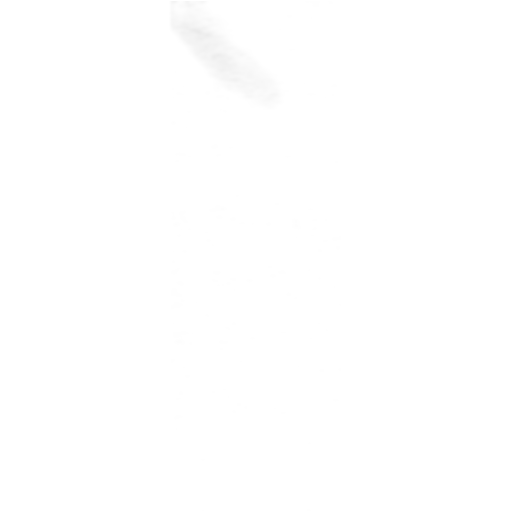
[im 176/176]
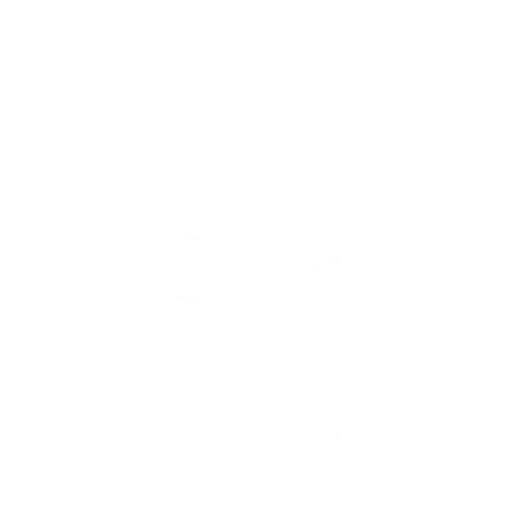

[Series 8092: results mm oncology reading · 1.0mm · 1.59mm/px · 1 of 12 slices shown]
[im 1/12]
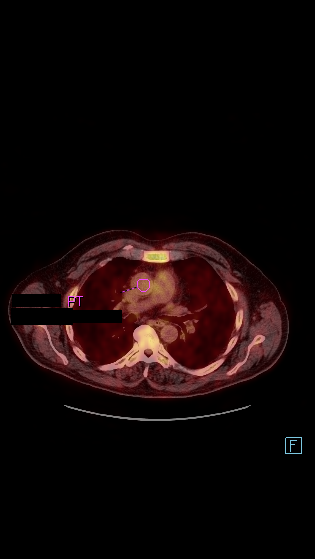

[21 of 25 positions shown; findings below may reference images not displayed]

FINDINGS: Mediastinal blood pool activity: SUV max

Liver activity: SUV max

NECK: Hypermetabolic lymphadenopathy in the neck seen previously has
resolved in the interval. Left-sided level 2 index node on the
previous study was 13 mm short axis (remeasured) which compares to 4
mm short axis today on 35/3. SUV max = 1.2, decreased from 5.5.

Right level II lymph node identified on the previous study has
decreased from 12 mm short axis (remeasured) to 6 mm on today's
study. SUV max = 1.0 today, decreased from 7.2 previously.

Incidental CT findings: none

CHEST: Similar marked decrease in thoracic lymphadenopathy.

Index right paratracheal node is 6 mm short axis today, decreased
from 12 mm previously. SUV max = 1.0 today, decreased from
previously.

Index right axillary node measured previously at 19 mm short axis is
6 mm short axis today (89/3). SUV max = 0.7 today decreased from
previously.

Left axillary index node is 6 mm today, decreased from 16 mm
previously. SUV max = 0.6, decreased from 7.3.

Incidental CT findings: Coronary artery calcification is evident.
Mild atherosclerotic calcification is noted in the wall of the
thoracic aorta. Right Port-A-Cath is new in the interval. The line
is looped in the right internal jugular vein with the tip directed
centrally in the right innominate vein, just proximal to the
confluence.

ABDOMEN/PELVIS: Splenomegaly seen previously has resolved. Splenic
activity today is below liver at SUV max = 3.1.

Abdominal lymphadenopathy is similarly decreased in the interval.
Index 2.2 cm aortocaval node measured previously is 0.8 cm today
(196/3). SUV max = 2.5 today decreased from 8.9 previously.

Index 2.2 cm right external iliac node measured previously is 0.6 cm
today. SUV max = 1.3 today, decreased from 10.3 previously.

The index left external iliac node measured previously at 2.5 cm is
now 0.7 cm with SUV max = 0.9, decreased from 10.9 previously.

Incidental CT findings: There is moderate atherosclerotic
calcification of the abdominal aorta without aneurysm. Insert fatty
liver diverticular changes noted left colon without diverticulitis.

SKELETON: Diffuse radiotracer accumulation noted within the marrow
space, new in the interval.

Incidental CT findings: none
IMPRESSION: 1. Marked interval response to therapy. Lymphadenopathy has resolved
on CT imaging with no residual hypermetabolism. Index nodes on
today's study are [HOSPITAL] 2 category.
2. Interval decrease in splenic size and hypermetabolism. Splenic
uptake is below liver levels on today's study.
3. No new hypermetabolic soft tissue disease on today's study.
4. Interval development of diffuse FDG accumulation within the
skeleton, presumably related to stimulatory effects of therapy.
5.  Aortic Atherosclerois ([id number]-170.0)

## 2022-08-17 ENCOUNTER — Other Ambulatory Visit: Payer: Self-pay

## 2022-08-17 NOTE — Telephone Encounter (Signed)
Medication refill for Spriva last ov 08/11/22, upcoming ov none . Please advise

## 2022-08-25 ENCOUNTER — Ambulatory Visit
Admission: RE | Admit: 2022-08-25 | Discharge: 2022-08-25 | Disposition: A | Discharge status: Home / Self Care | Payer: Medicare HMO | Source: Ambulatory Visit | Attending: Orthopedic Surgery | Admitting: Orthopedic Surgery

## 2022-08-25 DIAGNOSIS — M7989 Other specified soft tissue disorders: Secondary | ICD-10-CM | POA: Diagnosis not present

## 2022-08-25 DIAGNOSIS — M25572 Pain in left ankle and joints of left foot: Secondary | ICD-10-CM

## 2022-08-28 ENCOUNTER — Other Ambulatory Visit: Payer: Medicare HMO

## 2022-09-01 DIAGNOSIS — S92309A Fracture of unspecified metatarsal bone(s), unspecified foot, initial encounter for closed fracture: Secondary | ICD-10-CM | POA: Insufficient documentation

## 2022-09-01 DIAGNOSIS — Z8571 Personal history of Hodgkin lymphoma: Secondary | ICD-10-CM | POA: Diagnosis not present

## 2022-09-01 DIAGNOSIS — S92215A Nondisplaced fracture of cuboid bone of left foot, initial encounter for closed fracture: Secondary | ICD-10-CM | POA: Diagnosis not present

## 2022-09-01 DIAGNOSIS — S92322A Displaced fracture of second metatarsal bone, left foot, initial encounter for closed fracture: Secondary | ICD-10-CM | POA: Diagnosis not present

## 2022-09-01 DIAGNOSIS — S8262XD Displaced fracture of lateral malleolus of left fibula, subsequent encounter for closed fracture with routine healing: Secondary | ICD-10-CM | POA: Diagnosis not present

## 2022-09-01 DIAGNOSIS — S92332A Displaced fracture of third metatarsal bone, left foot, initial encounter for closed fracture: Secondary | ICD-10-CM | POA: Diagnosis not present

## 2022-09-01 DIAGNOSIS — S93622A Sprain of tarsometatarsal ligament of left foot, initial encounter: Secondary | ICD-10-CM | POA: Diagnosis not present

## 2022-09-01 DIAGNOSIS — S8265XA Nondisplaced fracture of lateral malleolus of left fibula, initial encounter for closed fracture: Secondary | ICD-10-CM | POA: Diagnosis not present

## 2022-09-01 DIAGNOSIS — S8263XA Displaced fracture of lateral malleolus of unspecified fibula, initial encounter for closed fracture: Secondary | ICD-10-CM | POA: Insufficient documentation

## 2022-09-01 DIAGNOSIS — S93629A Sprain of tarsometatarsal ligament of unspecified foot, initial encounter: Secondary | ICD-10-CM | POA: Insufficient documentation

## 2022-09-09 ENCOUNTER — Telehealth: Payer: Self-pay

## 2022-09-09 NOTE — Telephone Encounter (Signed)
Pt's financial assistance paperwork has been forwarded to [employer] for PCP's signature since it is asking for Dr. Rosanna Randy and he is now retired. Reached out to [place] and front desk stated to fax paperwork over and received a fax confirmation.

## 2022-09-11 ENCOUNTER — Ambulatory Visit: Payer: Medicare HMO | Attending: Cardiology | Admitting: Cardiology

## 2022-09-11 ENCOUNTER — Encounter: Payer: Self-pay | Admitting: Cardiology

## 2022-09-11 VITALS — BP 110/78 | HR 91 | Ht 74.0 in | Wt 235.8 lb

## 2022-09-11 DIAGNOSIS — R054 Cough syncope: Secondary | ICD-10-CM | POA: Diagnosis not present

## 2022-09-11 DIAGNOSIS — R55 Syncope and collapse: Secondary | ICD-10-CM

## 2022-09-11 DIAGNOSIS — I4711 Inappropriate sinus tachycardia, so stated: Secondary | ICD-10-CM

## 2022-09-11 DIAGNOSIS — E782 Mixed hyperlipidemia: Secondary | ICD-10-CM | POA: Diagnosis not present

## 2022-09-11 DIAGNOSIS — F17200 Nicotine dependence, unspecified, uncomplicated: Secondary | ICD-10-CM

## 2022-09-11 DIAGNOSIS — I10 Essential (primary) hypertension: Secondary | ICD-10-CM | POA: Diagnosis not present

## 2022-09-11 NOTE — Progress Notes (Signed)
Cardiology Office Note:    Date:  09/11/2022   ID:  Russell Simpson, DOB September 22, 1961, MRN [id number]  PCP:  Jon Billings, NP   [facility] Providers Cardiologist:  Kate Sable, MD     Referring MD: [facility], [facility]*   No chief complaint on file.   History of Present Illness:    Russell Simpson is a 61 y.o. male with a hx of hypertension, hyperlipidemia, asthma, current smoker x20+ years, lymphoma, inappropriate sinus tach who presents for follow-up.  Being seen for inappropriate sinus tach and hypertension.  Blood pressures have been adequately controlled, heart rates also controlled on Toprol-XL.  Feels well, had an episode of persistent coughing leading to passing out while at [facility].  He still smokes, working on quitting.  No other concerns at this time.  Prior notes Echo 07/2020 EF 60 to 65%, impaired relaxation. Cardiac monitor 08/2021.  No significant arrhythmias.  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[place])    Arthritis    Asthma    Basal cell carcinoma 01/28/2021   R upper arm, [facility] 03/04/2021   Brain 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   Chronic pain    right arm   Depression    Environmental and seasonal allergies    Follicular lymphoma ([place])    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   left neck, Moh's 05/29/21   Hypertension    Pneumonia    SCC (squamous cell carcinoma) 08/28/2021   upper chest right of midline, EDC done 09/17/2021   Sleep apnea    does not wear CPAP ; uses humidifier instead   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   right upper back, ED&C 06/18/2022   Squamous cell carcinoma of skin 02/19/2021   L inferior mandible, treated with EDC   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/28/2021   R ant shoulder - ED&C   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/28/2021   L upper abdomen - ED&C    Past Surgical History:  Procedure Laterality Date   ANTERIOR  CERVICAL DECOMP/DISCECTOMY FUSION N/A 06/17/2016   Procedure: ANTERIOR CERVICAL DECOMPRESSION FUSION, CERVICAL 3-4, CERVICAL 4-5 WITH INSTRUMENTATION AND ALLOGRAFT;  Surgeon: Phylliss Bob, MD;  Location: [place];  Service: Orthopedics;  Laterality: N/A;  ANTERIOR CERVICAL DECOMPRESSION FUSION, CERVICAL 3-4, CERVICAL 4-5 WITH INSTRUMENTATION AND ALLOGRAFT   BACK SURGERY     NECK SURGERY  2009   PORTA CATH INSERTION N/A 04/03/2021   Procedure: PORTA CATH INSERTION;  Surgeon: Algernon Huxley, MD;  Location: [place] [facility];  Service: Cardiovascular;  Laterality: N/A;    Current Medications: Current Meds  Medication Sig   albuterol (VENTOLIN HFA) 108 (90 Base) MCG/ACT inhaler Inhale 2 puffs into the lungs every 6 (six) hours as needed for wheezing or shortness of breath.   allopurinol (ZYLOPRIM) 300 MG tablet TAKE 1 TABLET BY MOUTH ONCE DAILY   AMBULATORY NON FORMULARY MEDICATION Trimix (30/1/10)-(Pap/Phent/PGE)  Test Dose  52m vial   Qty #3 Refills 0  [facility] [phone] [phone]  amLODipine (NORVASC) 5 MG tablet Take 1 tablet (5 mg total) by mouth daily.   atorvastatin (LIPITOR) 80 MG tablet Take 1 tablet (80 mg total) by mouth daily.   buPROPion (WELLBUTRIN XL) 300 MG 24 hr tablet Take 1 tablet (300 mg total) by mouth daily.   celecoxib (CELEBREX) 200 MG capsule    clonazePAM (KLONOPIN) 0.5 MG tablet Take  0.5 mg by mouth daily.   diazepam (VALIUM) 5 MG tablet Take 5 mg by mouth daily as needed.   DULoxetine (CYMBALTA) 60 MG capsule Take 60 mg by mouth daily.   fexofenadine (ALLEGRA ALLERGY) 180 MG tablet Take 1 tablet (180 mg total) by mouth daily.   finasteride (PROSCAR) 5 MG tablet Take 1 tablet (5 mg total) by mouth daily.   fluorouracil (EFUDEX) 5 % cream Apply topically 2 (two) times daily. Apply twice a day to affected areas as directed in handout.   metoprolol succinate (TOPROL-XL) 50 MG 24 hr tablet Take 2 tablets (100 mg total) by mouth daily. Take with or  immediately following a meal.   mirtazapine (REMERON) 15 MG tablet Take 15 mg by mouth at bedtime.   modafinil (PROVIGIL) 200 MG tablet Take 1 tablet by mouth daily.   montelukast (SINGULAIR) 10 MG tablet TAKE 1 TABLET AT BEDTIME   mupirocin ointment (BACTROBAN) 2 % Apply 1 Application topically daily. Apply to any open wounds until healed.   Nicotine (NICOTROL NS) 10 MG/ML SOLN 2 sprays daily.   OLANZapine (ZYPREXA) 5 MG tablet Take 5 mg by mouth at bedtime.   sildenafil (REVATIO) 20 MG tablet TAKE 3-5 TABLETS BY MOUTH ONCE DAILY AS NEEDED FOR ERECTILE DYSFUNCTION   SPIRIVA RESPIMAT 2.5 MCG/ACT AERS INHALE 2 PUFFS INTO THE LUNGS ONCE DAILY. *RINSE MOUTH AFTER USE*   tenofovir (VIREAD) 300 MG tablet Take 300 mg by mouth daily.   testosterone cypionate (DEPOTESTOSTERONE CYPIONATE) 200 MG/ML injection Inject 1 mL (200 mg total) into the muscle every 14 (fourteen) days.   traMADol (ULTRAM) 50 MG tablet Take 1 tablet by mouth daily.   triamcinolone (KENALOG) 0.025 % ointment Apply 1 Application topically 2 (two) times daily. Apply to affected areas of rash for up to 1 week. Avoid applying to face, groin, and axilla. Use as directed.     Allergies:   Penicillins, Tetanus toxoids, Lisinopril, Losartan, Tetanus toxoid, Codeine, Doxycycline, and Ruxience [rituximab-pvvr]   Social History   Socioeconomic History   Marital status: Divorced    Spouse name: Not on file   Number of children: Not on file   Years of education: Not on file   Highest education level: Not on file  Occupational History   Not on file  Tobacco Use   Smoking status: Every Day    Types: Cigars   Smokeless tobacco: Never   Tobacco comments:    8 a day  Vaping Use   Vaping Use: Former   Start date: 04/30/2017   Quit date: 11/30/2017  Substance and Sexual Activity   Alcohol use: Yes    Comment: beers 12 a day   Drug use: No   Sexual activity: Not Currently  Other Topics Concern   Not on file  Social History Narrative    Not on file   Social Determinants of Health   Financial Resource Strain: High Risk (10/27/2021)   Overall Financial Resource Strain (CARDIA)    Difficulty of Paying Living Expenses: Hard  Food Insecurity: Food Insecurity Present (10/27/2021)   Hunger Vital Sign    Worried About Running Out of Food in the Last Year: Often true    Ran Out of Food in the Last Year: Often true  Transportation Needs: No Transportation Needs (10/27/2021)   PRAPARE - Hydrologist (Medical): No    Lack of Transportation (Non-Medical): No  Physical Activity: Inactive (10/27/2021)   Exercise Vital Sign  Days of Exercise per Week: 0 days    Minutes of Exercise per Session: 0 min  Stress: No Stress Concern Present (10/27/2021)   [place]    Feeling of Stress : Only a little  Social Connections: Socially Isolated (10/27/2021)   Social Connection and Isolation Panel [NHANES]    Frequency of Communication with Friends and Family: More than three times a week    Frequency of Social Gatherings with Friends and Family: More than three times a week    Attends Religious Services: Never    Marine scientist or Organizations: No    Attends Music therapist: Never    Marital Status: Divorced     Family History: The patient's family history includes Diabetes in his mother and another family member; Emphysema in his father; Heart attack in his father.  ROS:   Please see the history of present illness.     All other systems reviewed and are negative.  EKGs/Labs/Other Studies Reviewed:    The following studies were reviewed today:   EKG:  EKG is  ordered today.  The ekg ordered today demonstrates normal sinus rhythm, normal ECG  Recent Labs: 08/07/2022: ALT 49; BUN 12; Creatinine, Ser 0.95; Hemoglobin 14.5; Platelets 180; Potassium 4.4; Sodium 128  Recent Lipid Panel    Component Value Date/Time    CHOL 164 06/23/2022 1327   CHOL 142 04/07/2022 1442   TRIG 94 06/23/2022 1327   HDL 43 06/23/2022 1327   HDL 35 (L) 04/07/2022 1442   CHOLHDL 3.8 06/23/2022 1327   VLDL 19 06/23/2022 1327   LDLCALC 102 (H) 06/23/2022 1327   LDLCALC 88 04/07/2022 1442   LDLCALC 108 (H) 10/15/2017 0906     Risk Assessment/Calculations:          Physical Exam:    VS:  BP 110/78   Pulse 91   Ht '6\' 2"'$  (1.88 m)   Wt 235 lb 12.8 oz (107 kg)   SpO2 95%   BMI 30.27 kg/m     Wt Readings from Last 3 Encounters:  09/11/22 235 lb 12.8 oz (107 kg)  08/07/22 237 lb 11.2 oz (107.8 kg)  04/07/22 233 lb 3.2 oz (105.8 kg)     GEN:  Well nourished, well developed in no acute distress HEENT: Normal NECK: No JVD; No carotid bruits CARDIAC: Tachycardia, regular, no murmurs, rubs, gallops RESPIRATORY:  Clear to auscultation without rales, wheezing or rhonchi  ABDOMEN: Soft, non-tender, non-distended MUSCULOSKELETAL:  No edema; No deformity  SKIN: Warm and dry NEUROLOGIC:  Alert and oriented x 3 PSYCHIATRIC:  Normal affect   ASSESSMENT:    1. Inappropriate sinus tachycardia   2. Primary hypertension   3. Hyperlipidemia, mixed   4. Cough syncope   5. Smoking    PLAN:    In order of problems listed above:  Patient with inappropriate sinus tach, overall improved with beta-blocker.  Continue Toprol-XL. Hypertension, BP controlled.  Norvasc, Toprol-XL as above. Hyperlipidemia, cholesterol controlled continue Lipitor 80 mg daily. Cough induced syncope, vasovagal etiology.  Made aware, reassured, prior echo and cardiac monitor was unrevealing.  Smoking cessation advised.  Follow-up as needed   Medication Adjustments/Labs and Tests Ordered: Current medicines are reviewed at length with the patient today.  Concerns regarding medicines are outlined above.  No orders of the defined types were placed in this encounter.  No orders of the defined types were placed in this encounter.   There are  no  Patient Instructions on file for this visit.   Signed, Kate Sable, MD  09/11/2022 4:23 PM    [place]

## 2022-09-11 NOTE — Patient Instructions (Signed)
Medication Instructions:   Your physician recommends that you continue on your current medications as directed. Please refer to the Current Medication list given to you today.  *If you need a refill on your cardiac medications before your next appointment, please call your pharmacy*    Follow-Up: At [facility], you and your health needs are our priority.  As part of our continuing mission to provide you with exceptional heart care, we have created designated Provider Care Teams.  These Care Teams include your primary Cardiologist (physician) and Advanced Practice Providers (APPs -  Physician Assistants and Nurse Practitioners) who all work together to provide you with the care you need, when you need it.  We recommend signing up for the patient portal called "MyChart".  Sign up information is provided on this After Visit Summary.  MyChart is used to connect with patients for Virtual Visits (Telemedicine).  Patients are able to view lab/test results, encounter notes, upcoming appointments, etc.  Non-urgent messages can be sent to your provider as well.   To learn more about what you can do with MyChart, go to [email or web address].    Your next appointment:   Follow up as needed   The format for your next appointment:   In Person  Provider:   Brian Agbor-Etang, MD    Other Instructions   Important Information About Sugar       

## 2022-09-14 ENCOUNTER — Other Ambulatory Visit: Payer: Medicare HMO

## 2022-09-16 ENCOUNTER — Encounter: Payer: Self-pay | Admitting: Urology

## 2022-09-17 ENCOUNTER — Other Ambulatory Visit: Payer: Medicare HMO

## 2022-09-17 ENCOUNTER — Ambulatory Visit: Payer: Medicare HMO | Admitting: Pulmonary Disease

## 2022-09-17 ENCOUNTER — Encounter: Payer: Self-pay | Admitting: Pulmonary Disease

## 2022-09-17 VITALS — BP 110/70 | HR 78 | Temp 98.1°F | Ht 74.0 in | Wt 246.6 lb

## 2022-09-17 DIAGNOSIS — F17290 Nicotine dependence, other tobacco product, uncomplicated: Secondary | ICD-10-CM | POA: Diagnosis not present

## 2022-09-17 DIAGNOSIS — J449 Chronic obstructive pulmonary disease, unspecified: Secondary | ICD-10-CM

## 2022-09-17 DIAGNOSIS — R0602 Shortness of breath: Secondary | ICD-10-CM | POA: Diagnosis not present

## 2022-09-17 DIAGNOSIS — C8200 Follicular lymphoma grade I, unspecified site: Secondary | ICD-10-CM

## 2022-09-17 DIAGNOSIS — R918 Other nonspecific abnormal finding of lung field: Secondary | ICD-10-CM

## 2022-09-17 DIAGNOSIS — N5201 Erectile dysfunction due to arterial insufficiency: Secondary | ICD-10-CM

## 2022-09-17 DIAGNOSIS — E291 Testicular hypofunction: Secondary | ICD-10-CM | POA: Diagnosis not present

## 2022-09-17 LAB — NITRIC OXIDE: Nitric Oxide: 5

## 2022-09-17 NOTE — Progress Notes (Signed)
Subjective:    Patient ID: Russell Simpson, male    DOB: [birth year], 61 y.o.   MRN: [id number] Patient Care Team: Jon Billings, NP as PCP - General (Nurse Practitioner) Kate Sable, MD as PCP - Cardiology (Cardiology) Sindy Guadeloupe, MD as Consulting Physician (Oncology)  Chief Complaint  Patient presents with   Follow-up    COPD. SOB with exertion. No wheezing. Cough at night with clear sputum.    HPI Russell Simpson is a 61 year old current smoker (little cigars daily) who was initially evaluated on 25 June 2020 for issues with cough and gastroesophageal reflux..  He had issues with severe cough at times causing syncope.  He was having issues with severe gastroesophageal reflux.  At his initial visit we instituted a trial of Breztri 2 inhalations twice a day, PFTs were obtained to have confirmed moderate obstructive defect consistent with moderate COPD.  He had no bronchodilator response.  He was given lansoprazole for his GERD.  Patient was last seen on 12 September 2020 and then was lost to follow-up.  Today he presents as a kind referral by Dr. Randa Evens.  Since his last visit here the patient has been diagnosed with follicular lymphoma stage IV, grade 1.  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  On surveillance CT chest performed on 23 March 2022 the patient was right to have bibasilar lung nodules that were suspicious for metastatic disease however uncertain whether related to lymphoma or squamous cell skin cancer.  Both of this circumstances would be exceedingly rare however no other area of primary malignancy is noted.  Patient has been treated for his follicular lymphoma with 4 cycles of Bendamustine Rituxan.  He is lymphadenopathy responded to this.  Treatment was necessary due to severe splenomegaly.  Dr. Janese Banks discussed the findings with the patient on 3 May, however the patient kept postponing follow-up here until now.  Currently the patient does not  endorse any cough, sputum production or hemoptysis.  Does note wheezing and this of breath on occasion.  Wheezing responsive to albuterol.  He unfortunately continues to engage in smoking to small cigars.  He is smoking anywhere between 6 to 8 cigars a day.  He does inhale.  He has not had any fevers, chills or sweats.  No dyspnea.  No hemoptysis.  No other concerns or complaints.  Overall, he feels well and looks well.   Review of Systems A 10 point review of systems was performed and it is as noted above otherwise negative.  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[place])    Arthritis    Asthma    Basal cell carcinoma 01/28/2021   R upper arm, [facility] 03/04/2021   Brain 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   Chronic pain    right arm   Depression    Environmental and seasonal allergies    Follicular lymphoma ([place])    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   left neck, Moh's 05/29/21   Hypertension    Pneumonia    SCC (squamous cell carcinoma) 08/28/2021   upper chest right of midline, EDC done 09/17/2021   Sleep apnea    does not wear CPAP ; uses humidifier instead   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   right upper back, ED&C 06/18/2022   Squamous cell carcinoma of skin 02/19/2021   L inferior mandible, [facility] with [facility]  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/28/2021   R ant shoulder - ED&C   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/28/2021   L upper abdomen - ED&C   Past Surgical History:  Procedure Laterality Date   ANTERIOR CERVICAL DECOMP/DISCECTOMY FUSION N/A 06/17/2016   Procedure: ANTERIOR CERVICAL DECOMPRESSION FUSION, CERVICAL 3-4, CERVICAL 4-5 WITH INSTRUMENTATION AND ALLOGRAFT;  Surgeon: Phylliss Bob, MD;  Location: [facility];  Service: Orthopedics;  Laterality: N/A;  ANTERIOR CERVICAL DECOMPRESSION FUSION, CERVICAL 3-4, CERVICAL 4-5 WITH INSTRUMENTATION AND ALLOGRAFT   BACK SURGERY     NECK SURGERY  2009    PORTA CATH INSERTION N/A 04/03/2021   Procedure: PORTA CATH INSERTION;  Surgeon: Algernon Huxley, MD;  Location: [facility];  Service: Cardiovascular;  Laterality: N/A;   Patient Active Problem List   Diagnosis Date Noted   Closed fracture of metatarsal bone 09/01/2022   Sprain of ligament of tarsometatarsal joint 09/01/2022   Closed fracture of lateral malleolus 09/01/2022   Claustrophobia 08/05/2022   Closed fracture of distal fibula 05/28/2022   Lymphoma ([place]) 04/07/2022   Non-Hodgkin's lymphoma ([place]) 01/05/2022   Depression, recurrent ([place]) 01/05/2022   Tobacco abuse 01/05/2022   Gout 01/05/2022   Subacute cough 11/10/2021   Sinus congestion 10/30/2021   Hyperglycemia 08/26/2021   Squamous cell carcinoma of neck    Chemotherapy induced neutropenia (HCC)    Stage 2 moderate COPD by GOLD classification ([place]) 05/14/2021   Cellulitis 05/14/2021   Cellulitis, neck 05/14/2021   COVID-19 virus infection 05/14/2021   Alcohol use disorder, severe, dependence ([place]) 50/93/2671   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 ([place]) 04/03/2021   Aortic atherosclerosis ([place]) 02/27/2021   High risk medication use 03/21/2020   Noncompliance with treatment regimen 02/19/2020   Erectile dysfunction due to arterial insufficiency 08/10/2019   Mixed bipolar I disorder ([place]) 07/19/2019   PTSD (post-traumatic stress disorder) 07/19/2019   Panic disorder 07/19/2019   Tobacco use disorder 07/19/2019   Moderate episode of recurrent major depressive disorder ([place]) 05/25/2019   S/P lumbar fusion 02/22/2019   Alcohol use disorder, severe, in early remission ([place]) 01/01/2019   Disorder of bursae of shoulder region 07/21/2017   Full thickness rotator cuff tear 07/21/2017   Localized, primary osteoarthritis 07/21/2017   Osteoarthritis of elbow 07/21/2017   Osteoarthritis of knee 07/21/2017   Anxiety 01/10/2016   BP (high blood pressure) 01/10/2016   Apnea, sleep 01/10/2016    Hyperlipidemia 10/15/2015   Pituitary adenoma ([place]) 10/15/2015   Hypogonadism in male 10/15/2015   Depression 10/15/2015   Impotence of organic origin 10/15/2015   Allergic rhinitis 10/15/2015   Incomplete bladder emptying 05/30/2015   Hernia, inguinal, left 09/21/2014   Benign prostatic hyperplasia with lower urinary tract symptoms 10/24/2012   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[place]) 09/05/2012   OSTEOMYELITIS, ACUTE, OTHER SPEC SITE 06/09/2006   Social History   Tobacco Use   Smoking status: Every Day    Types: Cigars   Smokeless tobacco: Never   Tobacco comments:    8 cigars a day 09/17/2022- khj  Substance Use Topics   Alcohol use: Yes    Comment: beers 12 a day   Allergies  Allergen Reactions   Penicillins Anaphylaxis    Tolerates cefdinir, cephalexin and amoxicillin/clavulonate so true PCN allergy unlikely   Tetanus Toxoids Swelling   Lisinopril Cough   Losartan      Other reaction(s): Muscle Pain     Tetanus Toxoid    Codeine Nausea Only and Nausea And  Vomiting   Doxycycline Rash   Ruxience [Rituximab-Pvvr] Rash    05/06/21 pt w/ worsening rash during Ruxience infusion.  Face, neck and chest flushing redness   Current Meds  Medication Sig   albuterol (VENTOLIN HFA) 108 (90 Base) MCG/ACT inhaler Inhale 2 puffs into the lungs every 6 (six) hours as needed for wheezing or shortness of breath.   allopurinol (ZYLOPRIM) 300 MG tablet TAKE 1 TABLET BY MOUTH ONCE DAILY   AMBULATORY NON FORMULARY MEDICATION Trimix (30/1/10)-(Pap/Phent/PGE)  Test Dose  59m vial   Qty #3 Refills 0  [facility] [phone] [phone]  amLODipine (NORVASC) 5 MG tablet Take 1 tablet (5 mg total) by mouth daily.   atorvastatin (LIPITOR) 80 MG tablet Take 1 tablet (80 mg total) by mouth daily.   buPROPion (WELLBUTRIN XL) 300 MG 24 hr tablet Take 1 tablet (300 mg total) by mouth daily.   celecoxib (CELEBREX) 200 MG capsule Take 200 mg by mouth daily.   clonazePAM (KLONOPIN)  0.5 MG tablet Take 0.5 mg by mouth daily.   diazepam (VALIUM) 5 MG tablet Take 5 mg by mouth daily as needed.   DULoxetine (CYMBALTA) 60 MG capsule Take 60 mg by mouth daily.   fexofenadine (ALLEGRA ALLERGY) 180 MG tablet Take 1 tablet (180 mg total) by mouth daily.   finasteride (PROSCAR) 5 MG tablet Take 1 tablet (5 mg total) by mouth daily.   fluorouracil (EFUDEX) 5 % cream Apply topically 2 (two) times daily. Apply twice a day to affected areas as directed in handout.   metoprolol succinate (TOPROL-XL) 50 MG 24 hr tablet Take 2 tablets (100 mg total) by mouth daily. Take with or immediately following a meal.   mirtazapine (REMERON) 15 MG tablet Take 15 mg by mouth at bedtime.   modafinil (PROVIGIL) 200 MG tablet Take 1 tablet by mouth daily.   montelukast (SINGULAIR) 10 MG tablet TAKE 1 TABLET AT BEDTIME   mupirocin ointment (BACTROBAN) 2 % Apply 1 Application topically daily. Apply to any open wounds until healed.   Nicotine (NICOTROL NS) 10 MG/ML SOLN 2 sprays daily.   OLANZapine (ZYPREXA) 5 MG tablet Take 5 mg by mouth at bedtime.   sildenafil (REVATIO) 20 MG tablet TAKE 3-5 TABLETS BY MOUTH ONCE DAILY AS NEEDED FOR ERECTILE DYSFUNCTION   SPIRIVA RESPIMAT 2.5 MCG/ACT AERS INHALE 2 PUFFS INTO THE LUNGS ONCE DAILY. *RINSE MOUTH AFTER USE*   tenofovir (VIREAD) 300 MG tablet Take 300 mg by mouth daily.   testosterone cypionate (DEPOTESTOSTERONE CYPIONATE) 200 MG/ML injection Inject 1 mL (200 mg total) into the muscle every 14 (fourteen) days.   traMADol (ULTRAM) 50 MG tablet Take 1 tablet by mouth daily.   triamcinolone (KENALOG) 0.025 % ointment Apply 1 Application topically 2 (two) times daily. Apply to affected areas of rash for up to 1 week. Avoid applying to face, groin, and axilla. Use as directed.   Immunization History  Administered Date(s) Administered   Influenza Split 08/05/2010, 09/30/2011   Influenza,inj,Quad PF,6+ Mos 09/25/2015, 08/31/2018, 08/21/2019, 08/30/2019, 09/12/2020    PFIZER Comirnaty(Gray Top)Covid-19 Tri-Sucrose Vaccine 02/27/2021   Tdap 05/29/2009       Objective:   Physical Exam BP 110/70 (BP Location: Left Arm, Cuff Size: Normal)   Pulse 78   SpO2 90%  GENERAL: Well-developed, overweight male fully ambulatory, no acute distress.  Plethoric appearing HEAD: Normocephalic, atraumatic.  EYES: Pupils equal, round, reactive to light.  No scleral icterus.  MOUTH: Nose/mouth/throat not examined due to masking requirements  for COVID 19. NECK: Supple. No thyromegaly. Trachea midline. No JVD.  No adenopathy. PULMONARY: Good air entry bilaterally.  No adventitious sounds. CARDIOVASCULAR: S1 and S2. Regular rate and rhythm.  No rubs, murmurs or gallops heard. ABDOMEN: Benign. MUSCULOSKELETAL: No joint deformity, no clubbing, no edema.  NEUROLOGIC: No overt focal deficit, no gait disturbance, speech is fluent. SKIN: Intact,warm,dry.  Plethoric. PSYCH: Somewhat impulsive, cooperative.  Representative images from CT chest performed 23 March 2022 showing basilar lung nodules:     Lab Results  Component Value Date   NITRICOXIDE 5 09/17/2022  Nitric oxide not consistent with type II inflammation.      Assessment & Plan:     ICD-10-CM   1. Multiple lung nodules on CT  R91.8 CT SUPER D CHEST WO [facility] PILOT    NM PET Image Restage (PS) Skull Base to Thigh (F-18 FDG)   First order of business is to update imaging [facility] protocol CT/PET/CT Patient advised will need biopsy if nodules enlarged    2. Follicular lymphoma grade I, unspecified body region ([place])  C82.00 CT SUPER D CHEST WO [facility] PILOT    NM PET Image Restage (PS) Skull Base to Thigh (F-18 FDG)   Issue adds complexity to his management Status post Bendamustine Rituxan Managed by oncology    3. Stage 2 moderate COPD by GOLD classification ([place])  J44.9 Pulmonary Function Test [facility] Only   Reassess with PFTs Continue Spiriva and albuterol for now    4. SOB (shortness of breath)   R06.02 Nitric oxide    Pulmonary Function Test [facility] Only   Likely related to uncompensated COPD    5. Cigar smoker unmotivated to quit  F17.290    Patient was counseled regards to discontinuation of cigar smoking Total counseling time 3 to 5 minutes     Orders Placed This Encounter  Procedures   CT SUPER D CHEST WO [facility] PILOT    Standing Status:   Future    Number of Occurrences:   1    Standing Expiration Date:   10/18/2022    Order Specific Question:   Preferred imaging location?    Answer:   [facility]   NM PET Image Restage (PS) Skull Base to Thigh (F-18 FDG)    Standing Status:   Future    Number of Occurrences:   1    Standing Expiration Date:   10/18/2022    Order Specific Question:   If indicated for the ordered procedure, I authorize the administration of a radiopharmaceutical per Radiology protocol    Answer:   Yes    Order Specific Question:   Preferred imaging location?    Answer:   [facility]   Nitric oxide   Pulmonary Function Test [facility] Only    Standing Status:   Future    Standing Expiration Date:   12/18/2022    Order Specific Question:   Full PFT: includes the following: basic spirometry, spirometry pre & post bronchodilator, diffusion capacity (DLCO), lung volumes    Answer:   Full PFT    Order Specific Question:   This test can only be performed at    Answer:   [facility]   Patient was advised that he will need recent imaging given that latest imaging was in April.  He will need repeat CT chest in PET/CT.  He understands that he may likely need biopsy of these lesions in his lung.  We will coordinate this pending the results of the testing.  Is that the modality for biopsy would be robotic assisted navigational bronchoscopy.  The potential benefits, limitations and complications of the procedure were explained to him today.  Patient will be seen in follow-up in 4 to 6 weeks time however we will contact him in the interim should a biopsy  need to be performed prior to that.  [name], MD Advanced Bronchoscopy P[facility]    *This note was dictated using voice recognition software/Dragon.  Despite best efforts to proofread, errors can occur which can change the meaning. Any transcriptional errors that result from this process are unintentional and may not be fully corrected at the time of dictation.

## 2022-09-17 NOTE — H&P (View-Only) (Signed)
Subjective:    Patient ID: Russell Simpson, male    DOB: Nov 16, 1961, 61 y.o.   MRN: [id number] Patient Care Team: Jon Billings, NP as PCP - General (Nurse Practitioner) Kate Sable, MD as PCP - Cardiology (Cardiology) Sindy Guadeloupe, MD as Consulting Physician (Oncology)  Chief Complaint  Patient presents with   Follow-up    COPD. SOB with exertion. No wheezing. Cough at night with clear sputum.    HPI Russell Simpson is a 61 year old current smoker (little cigars daily) who was initially evaluated on 25 June 2020 for issues with cough and gastroesophageal reflux..  He had issues with severe cough at times causing syncope.  He was having issues with severe gastroesophageal reflux.  At his initial visit we instituted a trial of Breztri 2 inhalations twice a day, PFTs were obtained to have confirmed moderate obstructive defect consistent with moderate COPD.  He had no bronchodilator response.  He was given lansoprazole for his GERD.  Patient was last seen on 12 September 2020 and then was lost to follow-up.  Today he presents as a kind referral by Dr. Randa Evens.  Since his last visit here the patient has been diagnosed with follicular lymphoma stage IV, grade 1.  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  On surveillance CT chest performed on 23 March 2022 the patient was right to have bibasilar lung nodules that were suspicious for metastatic disease however uncertain whether related to lymphoma or squamous cell skin cancer.  Both of this circumstances would be exceedingly rare however no other area of primary malignancy is noted.  Patient has been treated for his follicular lymphoma with 4 cycles of Bendamustine Rituxan.  He is lymphadenopathy responded to this.  Treatment was necessary due to severe splenomegaly.  Dr. Janese Banks discussed the findings with the patient on 3 May, however the patient kept postponing follow-up here until now.  Currently the patient does not  endorse any cough, sputum production or hemoptysis.  Does note wheezing and this of breath on occasion.  Wheezing responsive to albuterol.  He unfortunately continues to engage in smoking to small cigars.  He is smoking anywhere between 6 to 8 cigars a day.  He does inhale.  He has not had any fevers, chills or sweats.  No dyspnea.  No hemoptysis.  No other concerns or complaints.  Overall, he feels well and looks well.   Review of Systems A 10 point review of systems was performed and it is as noted above otherwise negative.  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[place])    Arthritis    Asthma    Basal cell carcinoma 01/28/2021   R upper arm, [facility] 03/04/2021   Brain 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   Chronic pain    right arm   Depression    Environmental and seasonal allergies    Follicular lymphoma ([place])    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   left neck, Moh's 05/29/21   Hypertension    Pneumonia    SCC (squamous cell carcinoma) 08/28/2021   upper chest right of midline, EDC done 09/17/2021   Sleep apnea    does not wear CPAP ; uses humidifier instead   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   right upper back, ED&C 06/18/2022   Squamous cell carcinoma of skin 02/19/2021   L inferior mandible, treated with [facility]  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/28/2021   R ant shoulder - ED&C   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/28/2021   L upper abdomen - ED&C   Past Surgical History:  Procedure Laterality Date   ANTERIOR CERVICAL DECOMP/DISCECTOMY FUSION N/A 06/17/2016   Procedure: ANTERIOR CERVICAL DECOMPRESSION FUSION, CERVICAL 3-4, CERVICAL 4-5 WITH INSTRUMENTATION AND ALLOGRAFT;  Surgeon: Phylliss Bob, MD;  Location: [place];  Service: Orthopedics;  Laterality: N/A;  ANTERIOR CERVICAL DECOMPRESSION FUSION, CERVICAL 3-4, CERVICAL 4-5 WITH INSTRUMENTATION AND ALLOGRAFT   BACK SURGERY     NECK SURGERY  2009    PORTA CATH INSERTION N/A 04/03/2021   Procedure: PORTA CATH INSERTION;  Surgeon: Algernon Huxley, MD;  Location: [place] [facility];  Service: Cardiovascular;  Laterality: N/A;   Patient Active Problem List   Diagnosis Date Noted   Closed fracture of metatarsal bone 09/01/2022   Sprain of ligament of tarsometatarsal joint 09/01/2022   Closed fracture of lateral malleolus 09/01/2022   Claustrophobia 08/05/2022   Closed fracture of distal fibula 05/28/2022   Lymphoma ([place]) 04/07/2022   Non-Hodgkin's lymphoma ([place]) 01/05/2022   Depression, recurrent ([place]) 01/05/2022   Tobacco abuse 01/05/2022   Gout 01/05/2022   Subacute cough 11/10/2021   Sinus congestion 10/30/2021   Hyperglycemia 08/26/2021   Squamous cell carcinoma of neck    Chemotherapy induced neutropenia (HCC)    Stage 2 moderate COPD by GOLD classification ([place]) 05/14/2021   Cellulitis 05/14/2021   Cellulitis, neck 05/14/2021   COVID-19 virus infection 05/14/2021   Alcohol use disorder, severe, dependence ([place]) 16/11/930   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 ([place]) 04/03/2021   Aortic atherosclerosis ([place]) 02/27/2021   High risk medication use 03/21/2020   Noncompliance with treatment regimen 02/19/2020   Erectile dysfunction due to arterial insufficiency 08/10/2019   Mixed bipolar I disorder ([place]) 07/19/2019   PTSD (post-traumatic stress disorder) 07/19/2019   Panic disorder 07/19/2019   Tobacco use disorder 07/19/2019   Moderate episode of recurrent major depressive disorder ([place]) 05/25/2019   S/P lumbar fusion 02/22/2019   Alcohol use disorder, severe, in early remission ([place]) 01/01/2019   Disorder of bursae of shoulder region 07/21/2017   Full thickness rotator cuff tear 07/21/2017   Localized, primary osteoarthritis 07/21/2017   Osteoarthritis of elbow 07/21/2017   Osteoarthritis of knee 07/21/2017   Anxiety 01/10/2016   BP (high blood pressure) 01/10/2016   Apnea, sleep 01/10/2016    Hyperlipidemia 10/15/2015   Pituitary adenoma ([place]) 10/15/2015   Hypogonadism in male 10/15/2015   Depression 10/15/2015   Impotence of organic origin 10/15/2015   Allergic rhinitis 10/15/2015   Incomplete bladder emptying 05/30/2015   Hernia, inguinal, left 09/21/2014   Benign prostatic hyperplasia with lower urinary tract symptoms 10/24/2012   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[place]) 09/05/2012   OSTEOMYELITIS, ACUTE, OTHER SPEC SITE 06/09/2006   Social History   Tobacco Use   Smoking status: Every Day    Types: Cigars   Smokeless tobacco: Never   Tobacco comments:    8 cigars a day 09/17/2022- khj  Substance Use Topics   Alcohol use: Yes    Comment: beers 12 a day   Allergies  Allergen Reactions   Penicillins Anaphylaxis    Tolerates cefdinir, cephalexin and amoxicillin/clavulonate so true PCN allergy unlikely   Tetanus Toxoids Swelling   Lisinopril Cough   Losartan      Other reaction(s): Muscle Pain     Tetanus Toxoid    Codeine Nausea Only and Nausea And  Vomiting   Doxycycline Rash   Ruxience [Rituximab-Pvvr] Rash    05/06/21 pt w/ worsening rash during Ruxience infusion.  Face, neck and chest flushing redness   Current Meds  Medication Sig   albuterol (VENTOLIN HFA) 108 (90 Base) MCG/ACT inhaler Inhale 2 puffs into the lungs every 6 (six) hours as needed for wheezing or shortness of breath.   allopurinol (ZYLOPRIM) 300 MG tablet TAKE 1 TABLET BY MOUTH ONCE DAILY   AMBULATORY NON FORMULARY MEDICATION Trimix (30/1/10)-(Pap/Phent/PGE)  Test Dose  52m vial   Qty #3 Refills 0  [facility] [phone] [phone]  amLODipine (NORVASC) 5 MG tablet Take 1 tablet (5 mg total) by mouth daily.   atorvastatin (LIPITOR) 80 MG tablet Take 1 tablet (80 mg total) by mouth daily.   buPROPion (WELLBUTRIN XL) 300 MG 24 hr tablet Take 1 tablet (300 mg total) by mouth daily.   celecoxib (CELEBREX) 200 MG capsule Take 200 mg by mouth daily.   clonazePAM (KLONOPIN)  0.5 MG tablet Take 0.5 mg by mouth daily.   diazepam (VALIUM) 5 MG tablet Take 5 mg by mouth daily as needed.   DULoxetine (CYMBALTA) 60 MG capsule Take 60 mg by mouth daily.   fexofenadine (ALLEGRA ALLERGY) 180 MG tablet Take 1 tablet (180 mg total) by mouth daily.   finasteride (PROSCAR) 5 MG tablet Take 1 tablet (5 mg total) by mouth daily.   fluorouracil (EFUDEX) 5 % cream Apply topically 2 (two) times daily. Apply twice a day to affected areas as directed in handout.   metoprolol succinate (TOPROL-XL) 50 MG 24 hr tablet Take 2 tablets (100 mg total) by mouth daily. Take with or immediately following a meal.   mirtazapine (REMERON) 15 MG tablet Take 15 mg by mouth at bedtime.   modafinil (PROVIGIL) 200 MG tablet Take 1 tablet by mouth daily.   montelukast (SINGULAIR) 10 MG tablet TAKE 1 TABLET AT BEDTIME   mupirocin ointment (BACTROBAN) 2 % Apply 1 Application topically daily. Apply to any open wounds until healed.   Nicotine (NICOTROL NS) 10 MG/ML SOLN 2 sprays daily.   OLANZapine (ZYPREXA) 5 MG tablet Take 5 mg by mouth at bedtime.   sildenafil (REVATIO) 20 MG tablet TAKE 3-5 TABLETS BY MOUTH ONCE DAILY AS NEEDED FOR ERECTILE DYSFUNCTION   SPIRIVA RESPIMAT 2.5 MCG/ACT AERS INHALE 2 PUFFS INTO THE LUNGS ONCE DAILY. *RINSE MOUTH AFTER USE*   tenofovir (VIREAD) 300 MG tablet Take 300 mg by mouth daily.   testosterone cypionate (DEPOTESTOSTERONE CYPIONATE) 200 MG/ML injection Inject 1 mL (200 mg total) into the muscle every 14 (fourteen) days.   traMADol (ULTRAM) 50 MG tablet Take 1 tablet by mouth daily.   triamcinolone (KENALOG) 0.025 % ointment Apply 1 Application topically 2 (two) times daily. Apply to affected areas of rash for up to 1 week. Avoid applying to face, groin, and axilla. Use as directed.   Immunization History  Administered Date(s) Administered   Influenza Split 08/05/2010, 09/30/2011   Influenza,inj,Quad PF,6+ Mos 09/25/2015, 08/31/2018, 08/21/2019, 08/30/2019, 09/12/2020    PFIZER Comirnaty(Gray Top)Covid-19 Tri-Sucrose Vaccine 02/27/2021   Tdap 05/29/2009       Objective:   Physical Exam BP 110/70 (BP Location: Left Arm, Cuff Size: Normal)   Pulse 78   SpO2 90%  GENERAL: Well-developed, overweight male fully ambulatory, no acute distress.  Plethoric appearing HEAD: Normocephalic, atraumatic.  EYES: Pupils equal, round, reactive to light.  No scleral icterus.  MOUTH: Nose/mouth/throat not examined due to masking requirements  for COVID 19. NECK: Supple. No thyromegaly. Trachea midline. No JVD.  No adenopathy. PULMONARY: Good air entry bilaterally.  No adventitious sounds. CARDIOVASCULAR: S1 and S2. Regular rate and rhythm.  No rubs, murmurs or gallops heard. ABDOMEN: Benign. MUSCULOSKELETAL: No joint deformity, no clubbing, no edema.  NEUROLOGIC: No overt focal deficit, no gait disturbance, speech is fluent. SKIN: Intact,warm,dry.  Plethoric. PSYCH: Somewhat impulsive, cooperative.  Representative images from CT chest performed 23 March 2022 showing basilar lung nodules:     Lab Results  Component Value Date   NITRICOXIDE 5 09/17/2022  Nitric oxide not consistent with type II inflammation.      Assessment & Plan:     ICD-10-CM   1. Multiple lung nodules on CT  R91.8 CT SUPER D CHEST WO [facility] PILOT    NM PET Image Restage (PS) Skull Base to Thigh (F-18 FDG)   First order of business is to update imaging [facility] protocol CT/PET/CT Patient advised will need biopsy if nodules enlarged    2. Follicular lymphoma grade I, unspecified body region ([place])  C82.00 CT SUPER D CHEST WO [facility] PILOT    NM PET Image Restage (PS) Skull Base to Thigh (F-18 FDG)   Issue adds complexity to his management Status post Bendamustine Rituxan Managed by oncology    3. Stage 2 moderate COPD by GOLD classification ([place])  J44.9 Pulmonary Function Test [facility] Only   Reassess with PFTs Continue Spiriva and albuterol for now    4. SOB (shortness of breath)   R06.02 Nitric oxide    Pulmonary Function Test [facility] Only   Likely related to uncompensated COPD    5. Cigar smoker unmotivated to quit  F17.290    Patient was counseled regards to discontinuation of cigar smoking Total counseling time 3 to 5 minutes     Orders Placed This Encounter  Procedures   CT SUPER D CHEST WO [facility] PILOT    Standing Status:   Future    Number of Occurrences:   1    Standing Expiration Date:   10/18/2022    Order Specific Question:   Preferred imaging location?    Answer:   [facility]   NM PET Image Restage (PS) Skull Base to Thigh (F-18 FDG)    Standing Status:   Future    Number of Occurrences:   1    Standing Expiration Date:   10/18/2022    Order Specific Question:   If indicated for the ordered procedure, I authorize the administration of a radiopharmaceutical per Radiology protocol    Answer:   Yes    Order Specific Question:   Preferred imaging location?    Answer:   [facility]   Nitric oxide   Pulmonary Function Test [facility] Only    Standing Status:   Future    Standing Expiration Date:   12/18/2022    Order Specific Question:   Full PFT: includes the following: basic spirometry, spirometry pre & post bronchodilator, diffusion capacity (DLCO), lung volumes    Answer:   Full PFT    Order Specific Question:   This test can only be performed at    Answer:   [facility]   Patient was advised that he will need recent imaging given that latest imaging was in April.  He will need repeat CT chest in PET/CT.  He understands that he may likely need biopsy of these lesions in his lung.  We will coordinate this pending the results of the testing.  Is that the modality for biopsy would be robotic assisted navigational bronchoscopy.  The potential benefits, limitations and complications of the procedure were explained to him today.  Patient will be seen in follow-up in 4 to 6 weeks time however we will contact him in the interim should a biopsy  need to be performed prior to that.  [name], MD Advanced Bronchoscopy PCCM [facility]    *This note was dictated using voice recognition software/Dragon.  Despite best efforts to proofread, errors can occur which can change the meaning. Any transcriptional errors that result from this process are unintentional and may not be fully corrected at the time of dictation.

## 2022-09-17 NOTE — Patient Instructions (Signed)
We have ordered breathing test, a PET/CT and a specialized CT of the chest.  This will allow [place] to determine what is going on with these nodules in your chest.  We will see you in follow-up in 4 to 6 weeks time.  We will call you if we need to schedule a biopsy in the interim.

## 2022-09-18 LAB — HEMATOCRIT: Hematocrit: 42.4 % (ref 37.5–51.0)

## 2022-09-18 LAB — TESTOSTERONE: Testosterone: 386 ng/dL (ref 264–916)

## 2022-09-22 DIAGNOSIS — F3162 Bipolar disorder, current episode mixed, moderate: Secondary | ICD-10-CM | POA: Diagnosis not present

## 2022-09-22 DIAGNOSIS — F1010 Alcohol abuse, uncomplicated: Secondary | ICD-10-CM | POA: Diagnosis not present

## 2022-09-22 DIAGNOSIS — F410 Panic disorder [episodic paroxysmal anxiety] without agoraphobia: Secondary | ICD-10-CM | POA: Diagnosis not present

## 2022-09-25 ENCOUNTER — Telehealth: Payer: Self-pay | Admitting: Pulmonary Disease

## 2022-09-25 ENCOUNTER — Other Ambulatory Visit: Payer: Self-pay | Admitting: Dermatology

## 2022-09-25 DIAGNOSIS — L989 Disorder of the skin and subcutaneous tissue, unspecified: Secondary | ICD-10-CM

## 2022-09-25 DIAGNOSIS — H1045 Other chronic allergic conjunctivitis: Secondary | ICD-10-CM | POA: Diagnosis not present

## 2022-09-25 DIAGNOSIS — H5213 Myopia, bilateral: Secondary | ICD-10-CM | POA: Diagnosis not present

## 2022-09-25 NOTE — Telephone Encounter (Signed)
I have spoke with Russell Simpson and his PFT has been scheduled on 10/15/22 @ 4:00pm [facility]

## 2022-09-28 ENCOUNTER — Encounter (INDEPENDENT_AMBULATORY_CARE_PROVIDER_SITE_OTHER): Payer: Self-pay

## 2022-09-29 ENCOUNTER — Ambulatory Visit
Admission: RE | Admit: 2022-09-29 | Discharge: 2022-09-29 | Disposition: A | Discharge status: Home / Self Care | Payer: Medicare HMO | Source: Ambulatory Visit | Attending: Pulmonary Disease | Admitting: Pulmonary Disease

## 2022-09-29 DIAGNOSIS — K760 Fatty (change of) liver, not elsewhere classified: Secondary | ICD-10-CM | POA: Insufficient documentation

## 2022-09-29 DIAGNOSIS — Z01818 Encounter for other preprocedural examination: Secondary | ICD-10-CM | POA: Diagnosis not present

## 2022-09-29 DIAGNOSIS — C8200 Follicular lymphoma grade I, unspecified site: Secondary | ICD-10-CM | POA: Insufficient documentation

## 2022-09-29 DIAGNOSIS — I2510 Atherosclerotic heart disease of native coronary artery without angina pectoris: Secondary | ICD-10-CM | POA: Diagnosis not present

## 2022-09-29 DIAGNOSIS — R918 Other nonspecific abnormal finding of lung field: Secondary | ICD-10-CM | POA: Insufficient documentation

## 2022-09-29 DIAGNOSIS — I700 Atherosclerosis of aorta: Secondary | ICD-10-CM | POA: Insufficient documentation

## 2022-09-29 LAB — GLUCOSE, CAPILLARY: Glucose-Capillary: 109 mg/dL — ABNORMAL HIGH (ref 70–99)

## 2022-09-29 MED ORDER — FLUDEOXYGLUCOSE F - 18 (FDG) INJECTION
12.7000 | Freq: Once | INTRAVENOUS | Status: AC | PRN
  Administered 2022-09-29: 13.64 via INTRAVENOUS

## 2022-09-30 ENCOUNTER — Ambulatory Visit: Payer: Medicare HMO | Admitting: Dermatology

## 2022-09-30 DIAGNOSIS — D492 Neoplasm of unspecified behavior of bone, soft tissue, and skin: Secondary | ICD-10-CM

## 2022-09-30 DIAGNOSIS — L821 Other seborrheic keratosis: Secondary | ICD-10-CM | POA: Diagnosis not present

## 2022-09-30 DIAGNOSIS — L578 Other skin changes due to chronic exposure to nonionizing radiation: Secondary | ICD-10-CM | POA: Diagnosis not present

## 2022-09-30 DIAGNOSIS — C44529 Squamous cell carcinoma of skin of other part of trunk: Secondary | ICD-10-CM | POA: Diagnosis not present

## 2022-09-30 DIAGNOSIS — D045 Carcinoma in situ of skin of trunk: Secondary | ICD-10-CM | POA: Diagnosis not present

## 2022-09-30 DIAGNOSIS — L570 Actinic keratosis: Secondary | ICD-10-CM | POA: Diagnosis not present

## 2022-09-30 DIAGNOSIS — Z86007 Personal history of in-situ neoplasm of skin: Secondary | ICD-10-CM

## 2022-09-30 DIAGNOSIS — D229 Melanocytic nevi, unspecified: Secondary | ICD-10-CM

## 2022-09-30 HISTORY — DX: Personal history of in-situ neoplasm of skin: Z86.007

## 2022-09-30 NOTE — Patient Instructions (Addendum)
- Start 5-fluorouracil cream twice a day for 7 days to affected areas including arms up to elbows and dorsal hands, once healed can treat arms from elbows up, then once that is healed may treat chest.  Reviewed course of treatment and expected reaction.  Patient advised to expect inflammation and crusting and advised that erosions are possible.  Patient advised to be diligent with sun protection during and after treatment. Handout with details of how to apply medication and what to expect provided. Counseled to keep medication out of reach of children and pets.  Cryotherapy Aftercare  Wash gently with soap and water everyday.   Apply Vaseline and Band-Aid daily until healed.   Wound Care Instructions  Cleanse wound gently with soap and water once a day then pat dry with clean gauze. Apply a thin coat of Petrolatum (petroleum jelly, "Vaseline") over the wound (unless you have an allergy to this). We recommend that you use a new, sterile tube of Vaseline. Do not pick or remove scabs. Do not remove the yellow or white "healing tissue" from the base of the wound.  Cover the wound with fresh, clean, nonstick gauze and secure with paper tape. You may use Band-Aids in place of gauze and tape if the wound is small enough, but would recommend trimming much of the tape off as there is often too much. Sometimes Band-Aids can irritate the skin.  You should call the office for your biopsy report after 1 week if you have not already been contacted.  If you experience any problems, such as abnormal amounts of bleeding, swelling, significant bruising, significant pain, or evidence of infection, please call the office immediately.  FOR ADULT SURGERY PATIENTS: If you need something for pain relief you may take 1 extra strength Tylenol (acetaminophen) AND 2 Ibuprofen ('200mg'$  each) together every 4 hours as needed for pain. (do not take these if you are allergic to them or if you have a reason you should not take them.)  Typically, you may only need pain medication for 1 to 3 days.    Due to recent changes in healthcare laws, you may see results of your pathology and/or laboratory studies on MyChart before the doctors have had a chance to review them. We understand that in some cases there may be results that are confusing or concerning to you. Please understand that not all results are received at the same time and often the doctors may need to interpret multiple results in order to provide you with the best plan of care or course of treatment. Therefore, we ask that you please give [place] 2 business days to thoroughly review all your results before contacting the office for clarification. Should we see a critical lab result, you will be contacted sooner.   If You Need Anything After Your Visit  If you have any questions or concerns for your doctor, please call our main line at [phone] and press option 4 to reach your doctor's medical assistant. If no one answers, please leave a voicemail as directed and we will return your call as soon as possible. Messages left after 4 pm will be answered the following business day.   You may also send [place] a message via [place]. We typically respond to MyChart messages within 1-2 business days.  For prescription refills, please ask your pharmacy to contact our office. Our fax number is [phone].  If you have an urgent issue when the clinic is closed that cannot wait until the next business  day, you can page your doctor at the number below.    Please note that while we do our best to be available for urgent issues outside of office hours, we are not available 24/7.   If you have an urgent issue and are unable to reach [place], you may choose to seek medical care at your doctor's office, retail clinic, urgent care center, or emergency room.  If you have a medical emergency, please immediately call 911 or go to the emergency department.  Pager Numbers  - Dr. Nehemiah Massed:  [phone]  - Dr. Laurence Ferrari: [phone]  - Dr. Nicole Kindred: [phone]  In the event of inclement weather, please call our main line at [phone] for an update on the status of any delays or closures.  Dermatology Medication Tips: Please keep the boxes that topical medications come in in order to help keep track of the instructions about where and how to use these. Pharmacies typically print the medication instructions only on the boxes and not directly on the medication tubes.   If your medication is too expensive, please contact our office at [phone] option 4 or send [place] a message through [place].   We are unable to tell what your co-pay for medications will be in advance as this is different depending on your insurance coverage. However, we may be able to find a substitute medication at lower cost or fill out paperwork to get insurance to cover a needed medication.   If a prior authorization is required to get your medication covered by your insurance company, please allow [place] 1-2 business days to complete this process.  Drug prices often vary depending on where the prescription is filled and some pharmacies may offer cheaper prices.  The website www.goodrx.com contains coupons for medications through different pharmacies. The prices here do not account for what the cost may be with help from insurance (it may be cheaper with your insurance), but the website can give you the price if you did not use any insurance.  - You can print the associated coupon and take it with your prescription to the pharmacy.  - You may also stop by our office during regular business hours and pick up a GoodRx coupon card.  - If you need your prescription sent electronically to a different pharmacy, notify our office through [facility] or by phone at [phone] option 4.     Si Usted Necesita Algo Despus de Su Visita  Tambin puede enviarnos un mensaje a travs de Pharmacist, community. Por lo general  respondemos a los mensajes de MyChart en el transcurso de 1 a 2 das hbiles.  Para renovar recetas, por favor pida a su farmacia que se ponga en contacto con nuestra oficina. Harland Dingwall de fax es [place] [phone].  Si tiene un asunto urgente cuando la clnica est cerrada y que no puede esperar hasta el siguiente da hbil, puede llamar/localizar a su doctor(a) al nmero que aparece a continuacin.   Por favor, tenga en cuenta que aunque hacemos todo lo posible para estar disponibles para asuntos urgentes fuera del horario de [place], no estamos disponibles las 24 horas del da, los 7 das de la [place].   Si tiene un problema urgente y no puede comunicarse con nosotros, puede optar por buscar atencin mdica  en el consultorio de su doctor(a), en una clnica privada, en un centro de atencin urgente o en una sala de emergencias.  Si tiene una emergencia mdica, por favor llame inmediatamente al 911 o vaya  a la sala de Multimedia programmer.  Nmeros de bper  - Dr. Nehemiah Massed: [phone]  - Dra. Moye: [phone]  - Dra. Nicole Kindred: [phone]  En caso de inclemencias del [place], por favor llame a Johnsie Kindred principal al [phone] para una actualizacin sobre el [place] de cualquier retraso o cierre.  Consejos para la medicacin en dermatologa: Por favor, guarde las cajas en las que vienen los medicamentos de uso tpico para ayudarle a seguir las instrucciones sobre dnde y cmo usarlos. Las farmacias generalmente imprimen las instrucciones del medicamento slo en las cajas y no directamente en los tubos del [place].   Si su medicamento es muy caro, por favor, pngase en contacto con Zigmund Daniel llamando al [phone] y presione la opcin 4 o envenos un mensaje a travs de Pharmacist, community.   No podemos decirle cul ser su copago por los medicamentos por adelantado ya que esto es diferente dependiendo de la cobertura de su seguro. Sin embargo, es posible que podamos encontrar un  medicamento sustituto a Electrical engineer un formulario para que el seguro cubra el medicamento que se considera necesario.   Si se requiere una autorizacin previa para que su compaa de seguros [place] su medicamento, por favor permtanos de 1 a 2 das hbiles para completar este proceso.  Los precios de los medicamentos varan con frecuencia dependiendo del Environmental consultant de dnde se surte la receta y alguna farmacias pueden ofrecer precios ms baratos.  El sitio web www.goodrx.com tiene cupones para medicamentos de Airline pilot. Los precios aqu no tienen en cuenta lo que podra costar con la ayuda del seguro (puede ser ms barato con su seguro), pero el sitio web puede darle el precio si no utiliz Research scientist (physical sciences).  - Puede imprimir el cupn correspondiente y llevarlo con su receta a la farmacia.  - Tambin puede pasar por nuestra oficina durante el horario de atencin regular y Charity fundraiser una tarjeta de cupones de GoodRx.  - Si necesita que su receta se enve electrnicamente a una farmacia diferente, informe a nuestra oficina a travs de MyChart de [place] o por telfono llamando al [phone] y presione la opcin 4.

## 2022-09-30 NOTE — Progress Notes (Unsigned)
Follow-Up Visit   Subjective  Russell Simpson is a 61 y.o. male who presents for the following: Follow-up (Patient here today for AK follow up at chest and shoulders. Patient used 5FU/calcipotriene with good results. Patient would like back checked too. Advises there is one spot at left chest that isn't healing. ).  The following portions of the chart were reviewed this encounter and updated as appropriate:   Tobacco  Allergies  Meds  Problems  Med Hx  Surg Hx  Fam Hx      Review of Systems:  No other skin or systemic complaints except as noted in HPI or Assessment and Plan.  Objective  Well appearing patient in no apparent distress; mood and affect are within normal limits.  All skin waist up examined.  Upper Mid Back x 1, hypertrophic at L forearm x 1, hypertrophic at L forehead x 1 (3) Erythematous thin papules/macules with gritty scale.   Right Upper Back 0.4 cm pink scaly papule     Left Upper Back 0.4 cm pink papule     Left Chest 0.9 cm pink papule       Assessment & Plan  AK (actinic keratosis) (3) Upper Mid Back x 1, hypertrophic at L forearm x 1, hypertrophic at L forehead x 1  Actinic keratoses are precancerous spots that appear secondary to cumulative UV radiation exposure/sun exposure over time. They are chronic with expected duration over 1 year. A portion of actinic keratoses will progress to squamous cell carcinoma of the skin. It is not possible to reliably predict which spots will progress to skin cancer and so treatment is recommended to prevent development of skin cancer.  Recommend daily broad spectrum sunscreen SPF 30+ to sun-exposed areas, reapply every 2 hours as needed.  Recommend staying in the shade or wearing long sleeves, sun glasses (UVA+UVB protection) and wide brim hats (4-inch brim around the entire circumference of the hat). Call for new or changing lesions.  Prior to procedure, discussed risks of blister formation, small  wound, skin dyspigmentation, or rare scar following cryotherapy. Recommend Vaseline ointment to treated areas while healing.  If left forehead not resolved, consider biopsy.  Destruction of lesion - Upper Mid Back x 1, hypertrophic at L forearm x 1, hypertrophic at L forehead x 1  Destruction method: cryotherapy   Informed consent: discussed and consent obtained   Lesion destroyed using liquid nitrogen: Yes   Cryotherapy cycles:  2 Outcome: patient tolerated procedure well with no complications   Post-procedure details: wound care instructions given    Neoplasm of skin (3) Right Upper Back  Epidermal / dermal shaving  Lesion diameter (cm):  0.4 Informed consent: discussed and consent obtained   Timeout: patient name, date of birth, surgical site, and procedure verified   Anesthesia: the lesion was anesthetized in a standard fashion   Anesthetic:  1% lidocaine w/ epinephrine 1-100,000 local infiltration Instrument used: flexible razor blade   Hemostasis achieved with: aluminum chloride   Outcome: patient tolerated procedure well   Post-procedure details: wound care instructions given   Additional details:  Mupirocin and a bandage applied  Destruction of lesion  Destruction method: electrodesiccation and curettage   Informed consent: discussed and consent obtained   Timeout:  patient name, date of birth, surgical site, and procedure verified Anesthesia: the lesion was anesthetized in a standard fashion   Anesthetic:  1% lidocaine w/ epinephrine 1-100,000 buffered w/ 8.4% NaHCO3 Curettage performed in three different directions: Yes   Electrodesiccation performed  over the curetted area: Yes   Curettage cycles:  3 Final wound size (cm):  0.6 Hemostasis achieved with:  electrodesiccation Outcome: patient tolerated procedure well with no complications   Post-procedure details: sterile dressing applied and wound care instructions given   Dressing type: petrolatum    Specimen 1 -  Surgical pathology Differential Diagnosis: r/o SCC  Check Margins: No 0.4 cm pink excoriated papule  Left Upper Back  Epidermal / dermal shaving  Lesion diameter (cm):  0.5 Informed consent: discussed and consent obtained   Timeout: patient name, date of birth, surgical site, and procedure verified   Anesthesia: the lesion was anesthetized in a standard fashion   Anesthetic:  1% lidocaine w/ epinephrine 1-100,000 local infiltration Instrument used: flexible razor blade   Hemostasis achieved with: aluminum chloride   Outcome: patient tolerated procedure well   Post-procedure details: wound care instructions given   Additional details:  Mupirocin and a bandage applied  Destruction of lesion  Destruction method: electrodesiccation and curettage   Informed consent: discussed and consent obtained   Timeout:  patient name, date of birth, surgical site, and procedure verified Anesthesia: the lesion was anesthetized in a standard fashion   Anesthetic:  1% lidocaine w/ epinephrine 1-100,000 buffered w/ 8.4% NaHCO3 Curettage performed in three different directions: Yes   Electrodesiccation performed over the curetted area: Yes   Curettage cycles:  3 Final wound size (cm):  0.5 Hemostasis achieved with:  electrodesiccation Outcome: patient tolerated procedure well with no complications   Post-procedure details: sterile dressing applied and wound care instructions given   Dressing type: petrolatum    Specimen 2 - Surgical pathology Differential Diagnosis: r/o SCC  Check Margins: No 0.5 cm pink papule   Left Chest  Epidermal / dermal shaving  Lesion diameter (cm):  0.9 Informed consent: discussed and consent obtained   Timeout: patient name, date of birth, surgical site, and procedure verified   Anesthesia: the lesion was anesthetized in a standard fashion   Anesthetic:  1% lidocaine w/ epinephrine 1-100,000 local infiltration Instrument used: flexible razor blade    Hemostasis achieved with: aluminum chloride   Outcome: patient tolerated procedure well   Post-procedure details: wound care instructions given   Additional details:  Mupirocin and a bandage applied  Destruction of lesion  Destruction method: electrodesiccation and curettage   Informed consent: discussed and consent obtained   Timeout:  patient name, date of birth, surgical site, and procedure verified Anesthesia: the lesion was anesthetized in a standard fashion   Anesthetic:  1% lidocaine w/ epinephrine 1-100,000 buffered w/ 8.4% NaHCO3 Curettage performed in three different directions: Yes   Electrodesiccation performed over the curetted area: Yes   Curettage cycles:  3 Final wound size (cm):  1.5 Hemostasis achieved with:  electrodesiccation Outcome: patient tolerated procedure well with no complications   Post-procedure details: sterile dressing applied and wound care instructions given   Dressing type: petrolatum    Specimen 3 - Surgical pathology Differential Diagnosis: r/o SCC  Check Margins: No 0.9 cm pink papule  Seborrheic Keratoses - Stuck-on, waxy, tan-brown papules and/or plaques  - Benign-appearing - Discussed benign etiology and prognosis. - Observe - Call for any changes  Melanocytic Nevi - Tan-brown and/or pink-flesh-colored symmetric macules and papules - Benign appearing on exam today - Observation - Call clinic for new or changing moles - Recommend daily use of broad spectrum spf 30+ sunscreen to sun-exposed areas.   Actinic Damage with PreCancerous Actinic Keratoses Counseling for Topical Chemotherapy Management: Patient  exhibits: - Severe, confluent actinic changes with pre-cancerous actinic keratoses that is secondary to cumulative UV radiation exposure over time - Condition that is severe; chronic, not at goal. - diffuse scaly erythematous macules and papules with underlying dyspigmentation - Discussed Prescription "Field Treatment" topical  Chemotherapy for Severe, Chronic Confluent Actinic Changes with Pre-Cancerous Actinic Keratoses Field treatment involves treatment of an entire area of skin that has confluent Actinic Changes (Sun/ Ultraviolet light damage) and PreCancerous Actinic Keratoses by method of PhotoDynamic Therapy (PDT) and/or prescription Topical Chemotherapy agents such as 5-fluorouracil, 5-fluorouracil/calcipotriene, and/or imiquimod.  The purpose is to decrease the number of clinically evident and subclinical PreCancerous lesions to prevent progression to development of skin cancer by chemically destroying early precancer changes that may or may not be visible.  It has been shown to reduce the risk of developing skin cancer in the treated area. As a result of treatment, redness, scaling, crusting, and open sores may occur during treatment course. One or more than one of these methods may be used and may have to be used several times to control, suppress and eliminate the PreCancerous changes. Discussed treatment course, expected reaction, and possible side effects. - Recommend daily broad spectrum sunscreen SPF 30+ to sun-exposed areas, reapply every 2 hours as needed.  - Staying in the shade or wearing long sleeves, sun glasses (UVA+UVB protection) and wide brim hats (4-inch brim around the entire circumference of the hat) are also recommended. - Call for new or changing lesions. - Start 5-fluorouracil cream twice a day for 7 days to affected areas including arms up to elbows and dorsal hands, once healed can treat arms from elbows up, then once that is healed may treat chest.  Reviewed course of treatment and expected reaction.  Patient advised to expect inflammation and crusting and advised that erosions are possible.  Patient advised to be diligent with sun protection during and after treatment. Handout with details of how to apply medication and what to expect provided. Counseled to keep medication out of reach of children  and pets.  Return for TBSE, AK follow up 1-3 months.  Graciella Belton, RMA, am acting as scribe for Forest Gleason, MD .  Documentation: I have reviewed the above documentation for accuracy and completeness, and I agree with the above.  Forest Gleason, MD

## 2022-10-02 ENCOUNTER — Telehealth: Payer: Self-pay

## 2022-10-02 NOTE — Telephone Encounter (Signed)
For the codes 31627, [id number], [phone] Prior Auth Not Required Refer # [id number]

## 2022-10-02 NOTE — Telephone Encounter (Signed)
Robotic bronchoscopy with navigation and EBUS scheduled 10/14/2022 at 12:30. CPT:31627,31652,31653 BR:AXEN nodule.   Rodena Piety, please see bronch info. Thanks

## 2022-10-05 ENCOUNTER — Encounter: Payer: Self-pay | Admitting: Dermatology

## 2022-10-05 ENCOUNTER — Encounter: Payer: Self-pay | Admitting: Pulmonary Disease

## 2022-10-05 NOTE — Telephone Encounter (Signed)
Phone pre admit visit 10/09/2022 between 8-1 and covid test 10/12/2022 between 8-12.   Lm for patient.

## 2022-10-05 NOTE — Telephone Encounter (Signed)
Spoke to patient's daughter, Lindsey(EC). She stated that she would contact patient and request that he call our office.

## 2022-10-05 NOTE — Telephone Encounter (Signed)
Spoke to patient and relayed below dates/times. He voiced his understanding and had no further questions. Nothing further needed.

## 2022-10-07 ENCOUNTER — Telehealth: Payer: Self-pay

## 2022-10-07 NOTE — Telephone Encounter (Addendum)
Tried calling patient regarding bx results. No answer. LMOM for patient to return call.  ----- Message from Alfonso Patten, MD sent at 10/07/2022  8:23 AM EST ----- 1. 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 Skin , left upper back SQUAMOUS CELL CARCINOMA IN SITU 3. 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 Recheck at follow-up.  MAs please call. Thank you!

## 2022-10-07 NOTE — Telephone Encounter (Signed)
-----   Message from Alfonso Patten, MD sent at 10/07/2022  8:23 AM EST ----- 1. 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 Skin , left upper back SQUAMOUS CELL CARCINOMA IN SITU 3. 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 Recheck at follow-up.  MAs please call. Thank you!

## 2022-10-09 ENCOUNTER — Encounter
Admission: RE | Admit: 2022-10-09 | Discharge: 2022-10-09 | Disposition: A | Discharge status: Home / Self Care | Payer: Medicare HMO | Source: Ambulatory Visit | Attending: Pulmonary Disease | Admitting: Pulmonary Disease

## 2022-10-09 VITALS — Ht 74.0 in | Wt 246.7 lb

## 2022-10-09 DIAGNOSIS — I700 Atherosclerosis of aorta: Secondary | ICD-10-CM

## 2022-10-09 DIAGNOSIS — E871 Hypo-osmolality and hyponatremia: Secondary | ICD-10-CM

## 2022-10-09 DIAGNOSIS — R748 Abnormal levels of other serum enzymes: Secondary | ICD-10-CM

## 2022-10-09 DIAGNOSIS — Z01812 Encounter for preprocedural laboratory examination: Secondary | ICD-10-CM

## 2022-10-09 DIAGNOSIS — I10 Essential (primary) hypertension: Secondary | ICD-10-CM

## 2022-10-09 DIAGNOSIS — J449 Chronic obstructive pulmonary disease, unspecified: Secondary | ICD-10-CM

## 2022-10-09 HISTORY — DX: Hypo-osmolality and hyponatremia: E87.1

## 2022-10-09 HISTORY — DX: Personal history of Methicillin resistant Staphylococcus aureus infection: Z86.14

## 2022-10-09 HISTORY — DX: Inappropriate sinus tachycardia, so stated: I47.11

## 2022-10-09 HISTORY — DX: Dyspnea, unspecified: R06.00

## 2022-10-09 HISTORY — DX: Chronic obstructive pulmonary disease, unspecified: J44.9

## 2022-10-09 HISTORY — DX: Abnormal levels of other serum enzymes: R74.8

## 2022-10-09 NOTE — Patient Instructions (Signed)
Your procedure is scheduled on:10-14-22 Wednesday Report to the Registration Desk on the 1st floor of the [facility].Then proceed to the 2nd floor Surgery Desk To find out your arrival time, please call [phone] between 1PM - 3PM on:10-13-22 Tuesday If your arrival time is 6:00 am, do not arrive prior to that time as the [facility] entrance doors do not open until 6:00 am.  REMEMBER: Instructions that are not followed completely may result in serious medical risk, up to and including death; or upon the discretion of your surgeon and anesthesiologist your surgery may need to be rescheduled.  Do not eat food OR drink any liquids after midnight the night before surgery.  No gum chewing, lozengers or hard candies.  Do NOT take any medication the day of surgery  Use your Albuterol Inhaler the day of surgery and bring your Albuterol Inhaler to the hospital  One week prior to surgery: Stop Anti-inflammatories (NSAIDS) such as Advil, Aleve, Ibuprofen, Motrin, Naproxen, Naprosyn and Aspirin based products such as Excedrin, Goodys Powder, BC Powder.You may however,  take Tylenol/Tramadol if needed for pain up until the day of surgery.  Stop ANY OVER THE COUNTER supplements/vitamins NOW (10-09-22) until after surgery.  No Alcohol for 24 hours before or after surgery.  No Smoking including e-cigarettes for 24 hours prior to surgery.  No chewable tobacco products for at least 6 hours prior to surgery.  No nicotine patches on the day of surgery.  Do not use any "recreational" drugs for at least a week prior to your surgery.  Please be advised that the combination of cocaine and anesthesia may have negative outcomes, up to and including death. If you test positive for cocaine, your surgery will be cancelled.  On the morning of surgery brush your teeth with toothpaste and water, you may rinse your mouth with mouthwash if you wish. Do not swallow any toothpaste or mouthwash.  Do not wear  jewelry, make-up, hairpins, clips or nail polish.  Do not wear lotions, powders, or perfumes.   Do not shave body from the neck down 48 hours prior to surgery just in case you cut yourself which could leave a site for infection.  Also, freshly shaved skin may become irritated if using the CHG soap.  Contact lenses, hearing aids and dentures may not be worn into surgery.  Do not bring valuables to the hospital. [facility] is not responsible for any missing/lost belongings or valuables.    Notify your doctor if there is any change in your medical condition (cold, fever, infection).  Wear comfortable clothing (specific to your surgery type) to the hospital.  After surgery, you can help prevent lung complications by doing breathing exercises.  Take deep breaths and cough every 1-2 hours. Your doctor may order a device called an Incentive Spirometer to help you take deep breaths. When coughing or sneezing, hold a pillow firmly against your incision with both hands. This is called "splinting." Doing this helps protect your incision. It also decreases belly discomfort.  If you are being admitted to the hospital overnight, leave your suitcase in the car. After surgery it may be brought to your room.  If you are being discharged the day of surgery, you will not be allowed to drive home. You will need a responsible adult (18 years or older) to drive you home and stay with you that night.   If you are taking public transportation, you will need to have a responsible adult (18 years or  older) with you. Please confirm with your physician that it is acceptable to use public transportation.   Please call the [facility] at [phone] if you have any questions about these instructions.  Surgery Visitation Policy:  Patients undergoing a surgery or procedure may have two family members or support persons with them as long as the person is not COVID-19 positive or experiencing its  symptoms.

## 2022-10-12 ENCOUNTER — Encounter
Admission: RE | Admit: 2022-10-12 | Discharge: 2022-10-12 | Disposition: A | Discharge status: Home / Self Care | Payer: Medicare HMO | Source: Ambulatory Visit | Attending: Pulmonary Disease | Admitting: Pulmonary Disease

## 2022-10-12 ENCOUNTER — Encounter: Payer: Self-pay | Admitting: Urgent Care

## 2022-10-12 DIAGNOSIS — Z1152 Encounter for screening for COVID-19: Secondary | ICD-10-CM | POA: Diagnosis not present

## 2022-10-12 DIAGNOSIS — E871 Hypo-osmolality and hyponatremia: Secondary | ICD-10-CM | POA: Diagnosis not present

## 2022-10-12 DIAGNOSIS — I10 Essential (primary) hypertension: Secondary | ICD-10-CM

## 2022-10-12 DIAGNOSIS — I700 Atherosclerosis of aorta: Secondary | ICD-10-CM | POA: Diagnosis not present

## 2022-10-12 DIAGNOSIS — R748 Abnormal levels of other serum enzymes: Secondary | ICD-10-CM

## 2022-10-12 DIAGNOSIS — Z01812 Encounter for preprocedural laboratory examination: Secondary | ICD-10-CM | POA: Diagnosis not present

## 2022-10-12 DIAGNOSIS — J449 Chronic obstructive pulmonary disease, unspecified: Secondary | ICD-10-CM | POA: Diagnosis not present

## 2022-10-12 DIAGNOSIS — Z20822 Contact with and (suspected) exposure to covid-19: Secondary | ICD-10-CM

## 2022-10-12 LAB — COMPREHENSIVE METABOLIC PANEL
ALT: 36 U/L (ref 0–44)
AST: 34 U/L (ref 15–41)
Albumin: 3.9 g/dL (ref 3.5–5.0)
Alkaline Phosphatase: 187 U/L — ABNORMAL HIGH (ref 38–126)
Anion gap: 10 (ref 5–15)
BUN: 9 mg/dL (ref 6–20)
CO2: 26 mmol/L (ref 22–32)
Calcium: 9.2 mg/dL (ref 8.9–10.3)
Chloride: 99 mmol/L (ref 98–111)
Creatinine, Ser: 1.03 mg/dL (ref 0.61–1.24)
GFR, Estimated: >60 mL/min (ref >60)
Glucose, Bld: 134 mg/dL — ABNORMAL HIGH (ref 70–99)
Potassium: 4.2 mmol/L (ref 3.5–5.1)
Sodium: 135 mmol/L (ref 135–145)
Total Bilirubin: 0.8 mg/dL (ref 0.3–1.2)
Total Protein: 6.6 g/dL (ref 6.5–8.1)

## 2022-10-12 LAB — SARS CORONAVIRUS 2 (TAT 6-24 HRS): SARS Coronavirus 2: NEGATIVE

## 2022-10-12 NOTE — Telephone Encounter (Signed)
Discussed biopsy results with pt  °

## 2022-10-13 DIAGNOSIS — S93622D Sprain of tarsometatarsal ligament of left foot, subsequent encounter: Secondary | ICD-10-CM | POA: Diagnosis not present

## 2022-10-13 DIAGNOSIS — Z8571 Personal history of Hodgkin lymphoma: Secondary | ICD-10-CM | POA: Diagnosis not present

## 2022-10-13 DIAGNOSIS — S8262XD Displaced fracture of lateral malleolus of left fibula, subsequent encounter for closed fracture with routine healing: Secondary | ICD-10-CM | POA: Diagnosis not present

## 2022-10-13 DIAGNOSIS — S8265XD Nondisplaced fracture of lateral malleolus of left fibula, subsequent encounter for closed fracture with routine healing: Secondary | ICD-10-CM | POA: Diagnosis not present

## 2022-10-13 DIAGNOSIS — S92215D Nondisplaced fracture of cuboid bone of left foot, subsequent encounter for fracture with routine healing: Secondary | ICD-10-CM | POA: Diagnosis not present

## 2022-10-14 ENCOUNTER — Ambulatory Visit
Admission: RE | Admit: 2022-10-14 | Discharge: 2022-10-14 | Disposition: A | Discharge status: Home / Self Care | Payer: Medicare HMO | Attending: Pulmonary Disease | Admitting: Pulmonary Disease

## 2022-10-14 ENCOUNTER — Ambulatory Visit: Payer: Medicare HMO

## 2022-10-14 ENCOUNTER — Encounter
Admission: RE | Disposition: A | Discharge status: Home / Self Care | Payer: Self-pay | Source: Home / Self Care | Attending: Pulmonary Disease

## 2022-10-14 ENCOUNTER — Ambulatory Visit: Payer: Medicare HMO | Admitting: Anesthesiology

## 2022-10-14 ENCOUNTER — Other Ambulatory Visit: Payer: Self-pay

## 2022-10-14 DIAGNOSIS — F319 Bipolar disorder, unspecified: Secondary | ICD-10-CM | POA: Diagnosis not present

## 2022-10-14 DIAGNOSIS — Z6831 Body mass index (BMI) 31.0-31.9, adult: Secondary | ICD-10-CM | POA: Insufficient documentation

## 2022-10-14 DIAGNOSIS — I10 Essential (primary) hypertension: Secondary | ICD-10-CM | POA: Insufficient documentation

## 2022-10-14 DIAGNOSIS — Z981 Arthrodesis status: Secondary | ICD-10-CM | POA: Insufficient documentation

## 2022-10-14 DIAGNOSIS — R590 Localized enlarged lymph nodes: Secondary | ICD-10-CM

## 2022-10-14 DIAGNOSIS — R918 Other nonspecific abnormal finding of lung field: Secondary | ICD-10-CM | POA: Diagnosis not present

## 2022-10-14 DIAGNOSIS — J449 Chronic obstructive pulmonary disease, unspecified: Secondary | ICD-10-CM | POA: Insufficient documentation

## 2022-10-14 DIAGNOSIS — R911 Solitary pulmonary nodule: Secondary | ICD-10-CM | POA: Insufficient documentation

## 2022-10-14 DIAGNOSIS — C8590 Non-Hodgkin lymphoma, unspecified, unspecified site: Secondary | ICD-10-CM | POA: Diagnosis not present

## 2022-10-14 DIAGNOSIS — Z79899 Other long term (current) drug therapy: Secondary | ICD-10-CM | POA: Diagnosis not present

## 2022-10-14 DIAGNOSIS — Z85828 Personal history of other malignant neoplasm of skin: Secondary | ICD-10-CM | POA: Diagnosis not present

## 2022-10-14 DIAGNOSIS — E669 Obesity, unspecified: Secondary | ICD-10-CM | POA: Insufficient documentation

## 2022-10-14 DIAGNOSIS — C8202 Follicular lymphoma grade I, intrathoracic lymph nodes: Secondary | ICD-10-CM | POA: Diagnosis not present

## 2022-10-14 DIAGNOSIS — F419 Anxiety disorder, unspecified: Secondary | ICD-10-CM | POA: Diagnosis not present

## 2022-10-14 DIAGNOSIS — Z8614 Personal history of Methicillin resistant Staphylococcus aureus infection: Secondary | ICD-10-CM | POA: Insufficient documentation

## 2022-10-14 DIAGNOSIS — R599 Enlarged lymph nodes, unspecified: Secondary | ICD-10-CM | POA: Diagnosis not present

## 2022-10-14 DIAGNOSIS — F17290 Nicotine dependence, other tobacco product, uncomplicated: Secondary | ICD-10-CM | POA: Insufficient documentation

## 2022-10-14 DIAGNOSIS — F17200 Nicotine dependence, unspecified, uncomplicated: Secondary | ICD-10-CM | POA: Diagnosis not present

## 2022-10-14 HISTORY — PX: VIDEO BRONCHOSCOPY WITH ENDOBRONCHIAL ULTRASOUND: SHX6177

## 2022-10-14 SURGERY — BRONCHOSCOPY, WITH BIOPSY USING ELECTROMAGNETIC NAVIGATION
Anesthesia: General | Laterality: Bilateral

## 2022-10-14 MED ORDER — ROCURONIUM BROMIDE 100 MG/10ML IV SOLN
INTRAVENOUS | Status: DC | PRN
  Administered 2022-10-14 (×2): 10 mg via INTRAVENOUS
  Administered 2022-10-14: 50 mg via INTRAVENOUS

## 2022-10-14 MED ORDER — PROPOFOL 10 MG/ML IV BOLUS
INTRAVENOUS | Status: AC
  Filled 2022-10-14: qty 20

## 2022-10-14 MED ORDER — MIDAZOLAM HCL 2 MG/2ML IJ SOLN
INTRAMUSCULAR | Status: DC | PRN
  Administered 2022-10-14: 2 mg via INTRAVENOUS

## 2022-10-14 MED ORDER — EPHEDRINE SULFATE (PRESSORS) 50 MG/ML IJ SOLN
INTRAMUSCULAR | Status: DC | PRN
  Administered 2022-10-14: 5 mg via INTRAVENOUS
  Administered 2022-10-14 (×2): 10 mg via INTRAVENOUS

## 2022-10-14 MED ORDER — IPRATROPIUM-ALBUTEROL 0.5-2.5 (3) MG/3ML IN SOLN: 3.0000 mL | Freq: Once | RESPIRATORY_TRACT | Status: AC

## 2022-10-14 MED ORDER — DEXAMETHASONE SODIUM PHOSPHATE 10 MG/ML IJ SOLN
INTRAMUSCULAR | Status: DC | PRN
  Administered 2022-10-14: 5 mg via INTRAVENOUS

## 2022-10-14 MED ORDER — PHENYLEPHRINE 80 MCG/ML (10ML) SYRINGE FOR IV PUSH (FOR BLOOD PRESSURE SUPPORT)
PREFILLED_SYRINGE | INTRAVENOUS | Status: DC | PRN
  Administered 2022-10-14: 160 ug via INTRAVENOUS

## 2022-10-14 MED ORDER — LIDOCAINE HCL (CARDIAC) PF 100 MG/5ML IV SOSY
PREFILLED_SYRINGE | INTRAVENOUS | Status: DC | PRN
  Administered 2022-10-14: 100 mg via INTRAVENOUS

## 2022-10-14 MED ORDER — IPRATROPIUM-ALBUTEROL 0.5-2.5 (3) MG/3ML IN SOLN
RESPIRATORY_TRACT | Status: AC
  Administered 2022-10-14: 3 mL via RESPIRATORY_TRACT
  Filled 2022-10-14: qty 3

## 2022-10-14 MED ORDER — FENTANYL CITRATE (PF) 100 MCG/2ML IJ SOLN
INTRAMUSCULAR | Status: AC
  Filled 2022-10-14: qty 2

## 2022-10-14 MED ORDER — MIDAZOLAM HCL 2 MG/2ML IJ SOLN
INTRAMUSCULAR | Status: AC
  Filled 2022-10-14: qty 2

## 2022-10-14 MED ORDER — LACTATED RINGERS IV SOLN: INTRAVENOUS | Status: DC

## 2022-10-14 MED ORDER — FENTANYL CITRATE (PF) 100 MCG/2ML IJ SOLN
INTRAMUSCULAR | Status: DC | PRN
  Administered 2022-10-14: 50 ug via INTRAVENOUS

## 2022-10-14 MED ORDER — KETAMINE HCL 50 MG/ML IJ SOLN
INTRAMUSCULAR | Status: DC | PRN
  Administered 2022-10-14: 25 mg via INTRAMUSCULAR

## 2022-10-14 MED ORDER — ORAL CARE MOUTH RINSE: 15.0000 mL | Freq: Once | OROMUCOSAL | Status: AC

## 2022-10-14 MED ORDER — FENTANYL CITRATE (PF) 100 MCG/2ML IJ SOLN: 25.0000 ug | INTRAMUSCULAR | Status: DC | PRN

## 2022-10-14 MED ORDER — FAMOTIDINE 20 MG PO TABS
ORAL_TABLET | ORAL | Status: AC
  Administered 2022-10-14: 20 mg via ORAL
  Filled 2022-10-14: qty 1

## 2022-10-14 MED ORDER — IPRATROPIUM-ALBUTEROL 0.5-2.5 (3) MG/3ML IN SOLN
RESPIRATORY_TRACT | Status: AC
  Filled 2022-10-14: qty 3

## 2022-10-14 MED ORDER — CHLORHEXIDINE GLUCONATE 0.12 % MT SOLN
OROMUCOSAL | Status: AC
  Administered 2022-10-14: 15 mL via OROMUCOSAL
  Filled 2022-10-14: qty 15

## 2022-10-14 MED ORDER — FAMOTIDINE 20 MG PO TABS: 20.0000 mg | ORAL_TABLET | Freq: Once | ORAL | Status: AC

## 2022-10-14 MED ORDER — CHLORHEXIDINE GLUCONATE 0.12 % MT SOLN: 15.0000 mL | Freq: Once | OROMUCOSAL | Status: AC

## 2022-10-14 MED ORDER — EPHEDRINE 5 MG/ML INJ
INTRAVENOUS | Status: AC
  Filled 2022-10-14: qty 10

## 2022-10-14 MED ORDER — PROPOFOL 10 MG/ML IV BOLUS
INTRAVENOUS | Status: DC | PRN
  Administered 2022-10-14: 180 mg via INTRAVENOUS

## 2022-10-14 MED ORDER — SODIUM CHLORIDE 0.9 % IV SOLN: Freq: Once | INTRAVENOUS | Status: AC

## 2022-10-14 MED ORDER — DEXMEDETOMIDINE HCL IN NACL 80 MCG/20ML IV SOLN
INTRAVENOUS | Status: DC | PRN
  Administered 2022-10-14: 12 ug via BUCCAL

## 2022-10-14 MED ORDER — ONDANSETRON HCL 4 MG/2ML IJ SOLN
INTRAMUSCULAR | Status: DC | PRN
  Administered 2022-10-14: 4 mg via INTRAVENOUS

## 2022-10-14 MED ORDER — OXYCODONE HCL 5 MG PO TABS: 5.0000 mg | ORAL_TABLET | Freq: Once | ORAL | Status: DC | PRN

## 2022-10-14 MED ORDER — SUGAMMADEX SODIUM 500 MG/5ML IV SOLN
INTRAVENOUS | Status: DC | PRN
  Administered 2022-10-14: 300 mg via INTRAVENOUS

## 2022-10-14 MED ORDER — LACTATED RINGERS IV SOLN: INTRAVENOUS | Status: DC | PRN

## 2022-10-14 MED ORDER — PHENYLEPHRINE HCL (PRESSORS) 10 MG/ML IV SOLN
INTRAVENOUS | Status: DC | PRN
  Administered 2022-10-14 (×2): 160 ug via INTRAVENOUS

## 2022-10-14 MED ORDER — OXYCODONE HCL 5 MG/5ML PO SOLN: 5.0000 mg | Freq: Once | ORAL | Status: DC | PRN

## 2022-10-14 MED ORDER — KETAMINE HCL 50 MG/ML IJ SOLN
INTRAMUSCULAR | Status: AC
  Filled 2022-10-14: qty 1

## 2022-10-14 MED ORDER — SODIUM CHLORIDE 0.9 % IV SOLN: INTRAVENOUS | Status: DC | PRN

## 2022-10-14 MED ORDER — IPRATROPIUM-ALBUTEROL 0.5-2.5 (3) MG/3ML IN SOLN
3.0000 mL | Freq: Once | RESPIRATORY_TRACT | Status: AC
  Administered 2022-10-14: 3 mL via RESPIRATORY_TRACT

## 2022-10-14 NOTE — Op Note (Signed)
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  Preoperative Diagnosis: Bilateral lung nodules, possible mediastinal adenopathy, rule out metastatic disease Post Procedure Diagnosis: Same as above Consent: Verbal/Written: obtained  Benefits, limitations and potential complications of the procedure were discussed with the patient/family.  Complications from bronchoscopy are rare and most often minor, but if they occur they may include breathing difficulty, vocal cord spasm, hoarseness, slight fever, vomiting, dizziness, bronchospasm, infection, low blood oxygen, bleeding from biopsy site, or an allergic reaction to medications.  It is uncommon for patients to experience other more serious complications for example: Collapsed lung requiring chest tube placement, respiratory failure, heart attack and/or cardiac arrhythmia.  Patient understood the potential complications and agreed to proceed.  Surgeon: [name], MD Assistant/Scrub: Liborio Nixon, RRT Circulator: N/A Anesthesiologist/CRNA: Liliane Shi Johnson/Jane Nymberg [employer]: Maryan Puls, team lead  Fluoroscopy technician:  Representatives:   Type of Anesthesia: General endotracheal  Procedures Performed:   Robotic bronchoscopy: Procedure consists of robotic navigation comprised of electromagnetics, optical pattern recognition and robotic kinematic data - to triangulate bronchoscope location during the procedure and provide accurate positional data to biopsy a lesion. Cellvizio probe based confocal laser endomicroscopy (pCLE) utilizing blue laser endomicroscopy. Augmented fluoroscopy with Body Vision. Endobronchial ultrasound for inspection of the mediastinal  structures  Description of Procedure:  Robotic bronchoscopy: The patient was brought to Procedure Room 2 (Bronchoscopy Suite) in the OR area where appropriate timeout was taken with the staff after the patient was inducted under general anesthesia.  The patient was inducted under general anesthesia and intubated by the anesthesia team.  Patient was intubated with a 9.0 ET tube without difficulty.  Tube was secured at 4 cm above the carina.  A Portex adapter was placed on the ET tube flange.  Once the patient was under adequate general anesthesia the Olympus therapeutic video bronchoscope was advanced and an anatomic airway tour and surveillance bronchoscopy was performed.The distal trachea appeared unremarkable. The main carina was sharp.  No secretions were seen in either right or left mainstem bronchi. The RUL, RLL, RML appeared to be free of endobronchial masses, lesions, or purulent secretions. Likewise, the LLL/LUL appeared to be free of endobronchial masses, lesions, or purulent secretions.  There were some scant benign appearing secretions on the right upper lobe bronchi that were suctioned until cleared.  Once the survey bronchoscopy was completed, registration for the augmented fluoroscopy (Body Vision) was then performed with the fluoroscopic C arm.  Once this was completed, the robotic bronchoscope ET tube adapter was placed and ETT was cut to proper length and secured on the mid plane.  [facility] robotic scope was then advanced through the ETT and registration was performed successfully.  There was good correlation between the robotic mapping and bronchoscopic mapping. With the assistance of fused navigation, the bronchoscope was advanced to the first targeted nodule/mass. The tip of the working channel sat within 12 mm of the nodule  Positioning was confirmed with augmented fluoroscopy.  At this point Cellvizio probe based confocal laser endomicroscopy (pCLE) was utilized to confirm target  acquisition.  However, the Cellvizio probe could not be passed further.   Augmented fluoroscopy via Body Vision was utilized to optimize the position most favorable for biopsies, then the robotic bronchoscope was anchored to maintain position.  The body vision images showed significant  atelectasis obscuring the chosen target.  At this point, it was elected to go to target #2 is good positioning could not be corroborated with Body Vision.  At this point the navigation was performed to target 2 in the right lower lobe, with the assistance of Body Vision augmented fluoroscopy, the Cellvizio probe was advanced and there was inflammation noted, the probe however was able to be moved to target confirmed by Cellvizio.  At this point anchoring the robotic scope 8 transbronchial biopsies were obtained at this point, unfortunately tissue acquisition was scant.  Because of the proximity of the lesion to the pleura brushes and needles were not utilized due to risk for pneumothorax.  The biopsies were placed in formalin.  After confirmation of excellent hemostasis, a gated BAL was also obtained at this segment, which sent for flow cytometry studies.  For BAL sample acquisition purposes, 20 ml of normal saline were instilled, and approximately 6 ml were recovered/trapped and sent for analysis.   The robotic bronchoscope was then retracted all the way out after confirmation of excellent hemostasis.  At this point an endobronchial ultrasound scope was then utilized to examine the mediastinum and hilar areas.  There were no enlarged nodes noted examination of paratracheal, precarinal, subcarinal and hilar areas bilaterally.  The lymph nodes noted where less than 0.5 cm in diameter.  Lymph nodes noted were also few.  Point the endobronchial ultrasound scope was then removed.  And the regular therapeutic bronchoscope was reinserted for final airway inspection.  Hemostasis was excellent,the patient then received bronchial lavage with  9 mL of 1% lidocaine via the ET tube. The patient tolerated the procedure well. No significant bleeding was observed at the conclusion of the procedure.  At this point, the patient was allowed to emerge from general anesthesia, and was extubated in the procedure room without incident.  The patient  was taken to the PACU in satisfactory condition.  Auscultation of the lungs showed no change from pre bronchoscopy examination.  Patient tolerated the procedure very well with no untoward effects of anesthesia noted.   Specimens Obtained:  Transbronchial Forceps Biopsy: X 8, RLL  Transbronchial Brush: N/A  Targeted BAL: 6 mL, RLL  Fluoroscopy: Augmented fluoroscopy (Body Vision) was utilized during the course of this procedure to assure that biopsies were taken in a safe manner under fluoroscopic guidance with spot films required.  Total fluoroscopy time: 6 minutes 12 seconds total dose 128.73 mGy.  Intraoperative images:  Fluoroscopic image during procedure:    Cellvizio image of target lesion in right lower lobe:    Cellvizio image of inflammatory change adjacent to lesion:    Robotic bronchoscope at target #2:    Representative image of small lymph node seen measuring 0.5 x 0.4 cm:      Complications:None, no pneumothorax on post film:   Estimated Blood Loss: Nil    Assessment and Plan/Additional Comments: Bilateral pulmonary nodules, status post biopsy right lower lobe nodule Rule out metastatic lymphoma Patient tolerated procedure well Await pathology reports Appropriate pulmonary and oncology follow-ups are scheduled     C. Derrill Kay, MD Advanced Bronchoscopy PCCM  [facility]    *This note was dictated using voice recognition software/Dragon.  Despite best efforts to proofread, errors can occur which can change the meaning.  Any change was purely unintentional.

## 2022-10-14 NOTE — Anesthesia Preprocedure Evaluation (Addendum)
Anesthesia Evaluation  Patient identified by MRN, date of birth, ID band Patient awake    Reviewed: Allergy & Precautions, NPO status , Patient's Chart, lab work & pertinent test results, reviewed documented beta blocker date and time   Airway Mallampati: III  TM Distance: >3 FB Neck ROM: full    Dental no notable dental hx.    Pulmonary shortness of breath and with exertion, sleep apnea , COPD (moderate), Current Smoker and Patient abstained from smoking. Multiple lung nodules on CT  current smoker x20+ years   Pulmonary exam normal        Cardiovascular Exercise Tolerance: Poor hypertension, Pt. on medications and Pt. on home beta blockers Normal cardiovascular exam  H/o Cough induced syncope, vasovagal etiology. prior echo and cardiac monitor was unrevealing. H/o sinus tachycardia treated with BB   Neuro/Psych  PSYCHIATRIC DISORDERS Anxiety Depression Bipolar Disorder   benign pituitary neoplasm negative neurological ROS     GI/Hepatic negative GI ROS, Neg liver ROS,,,  Endo/Other  negative endocrine ROS    Renal/GU      Musculoskeletal   Abdominal  (+) + obese  Peds  Hematology negative hematology ROS (+)   Anesthesia Other Findings H/o Follicular lymphoma grade I  Past Medical History: No date: 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HCC) No date: Arthritis No date: Asthma 01/28/2021: Basal cell carcinoma     Comment:  R upper arm, EDC 03/04/2021 No date: Brain tumor (benign) ([place])     Comment:  benign pituitary neoplasm No date: Chronic pain     Comment:  right arm No date: COPD (chronic obstructive pulmonary disease) (HCC) No date: Depression No date: Dyspnea No date: Elevated liver enzymes No date: Environmental and seasonal allergies 2023: Femur fracture, left ([facility]) 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[place]) No date: History of methicillin resistant staphylococcus aureus (MRSA)     Comment:  years ago 05/29/2021:  History of SCC (squamous cell carcinoma) of skin     Comment:  left neck, Moh's 05/29/21 No date: History of SCC (squamous cell carcinoma) of skin 09/30/2022: History of squamous cell carcinoma in situ (SCCIS)     Comment:  left upper back ED&C done No date: Hypertension No date: Hyponatremia No date: Inappropriate sinus tachycardia No date: Pneumonia 08/28/2021: SCC (squamous cell carcinoma)     Comment:  upper chest right of midline, EDC done 09/17/2021 09/30/2022: SCC (squamous cell carcinoma)     Comment:  left chest  ED&C done No date: Sleep apnea     Comment:  does not wear CPAP ; uses humidifier instead 05/27/2022: Squamous cell carcinoma in situ (SCCIS)     Comment:  right upper back, ED&C 06/18/2022 09/30/2022: Squamous cell carcinoma in situ (SCCIS)     Comment:  right upper back ED&C done 02/19/2021: Squamous cell carcinoma of skin     Comment:  L inferior mandible, treated with [facility] 08/28/2021: Squamous cell carcinoma of skin     Comment:  R ant shoulder - ED&C 08/28/2021: Squamous cell carcinoma of skin     Comment:  L upper abdomen - ED&C  Past Surgical History: 06/17/2016: ANTERIOR CERVICAL DECOMP/DISCECTOMY FUSION; N/A     Comment:  Procedure: ANTERIOR CERVICAL DECOMPRESSION FUSION,               CERVICAL 3-4, CERVICAL 4-5 WITH INSTRUMENTATION AND               ALLOGRAFT;  Surgeon: Phylliss Bob, MD;  Location: [facility];  Service: Orthopedics;  Laterality: N/A;  ANTERIOR               CERVICAL DECOMPRESSION FUSION, CERVICAL 3-4, CERVICAL 4-5              WITH INSTRUMENTATION AND ALLOGRAFT No date: BACK SURGERY     Comment:  x3 12/01/2007: NECK SURGERY 04/03/2021: PORTA CATH INSERTION; N/A     Comment:  Procedure: PORTA CATH INSERTION;  Surgeon: Algernon Huxley,              MD;  Location: [facility];  Service:               Cardiovascular;  Laterality: N/A;     Reproductive/Obstetrics negative OB ROS                              Anesthesia Physical Anesthesia Plan  ASA: 3  Anesthesia Plan: General   Post-op Pain Management: Minimal or no pain anticipated   Induction: Intravenous  PONV Risk Score and Plan: 1 and Ondansetron, Dexamethasone and Midazolam  Airway Management Planned: Oral ETT  Additional Equipment:   Intra-op Plan:   Post-operative Plan: Extubation in OR  Informed Consent: I have reviewed the patients History and Physical, chart, labs and discussed the procedure including the risks, benefits and alternatives for the proposed anesthesia with the patient or authorized representative who has indicated his/her understanding and acceptance.     Dental Advisory Given  Plan Discussed with: Anesthesiologist, CRNA and Surgeon  Anesthesia Plan Comments:         Anesthesia Quick Evaluation

## 2022-10-14 NOTE — Discharge Instructions (Signed)
AMBULATORY SURGERY  ?DISCHARGE INSTRUCTIONS ? ? ?The drugs that you were given will stay in your system until tomorrow so for the next 24 hours you should not: ? ?Drive an automobile ?Make any legal decisions ?Drink any alcoholic beverage ? ? ?You may resume regular meals tomorrow.  Today it is better to start with liquids and gradually work up to solid foods. ? ?You may eat anything you prefer, but it is better to start with liquids, then soup and crackers, and gradually work up to solid foods. ? ? ?Please notify your doctor immediately if you have any unusual bleeding, trouble breathing, redness and pain at the surgery site, drainage, fever, or pain not relieved by medication. ? ? ? ?Additional Instructions: ? ? ? ?Please contact your physician with any problems or Same Day Surgery at [phone], Monday through Friday 6 am to 4 pm, or [facility] [place] Main number at [phone].  ?

## 2022-10-14 NOTE — Interval H&P Note (Signed)
Russell Simpson has presented today for surgery, with the diagnosis of BILATERAL LUNG NODULES AND HILAR ADENOPATHY.  The various methods of treatment have been discussed with the patient and family. After consideration of risks, benefits and other options for treatment, the patient has consented to  Procedure(s): ROBOTIC [place] as a surgical intervention.  The patient's history has been reviewed, patient examined, no change in status, stable for surgery.  I have reviewed the patient's chart and labs.  Questions were answered to the patient's satisfaction.  Benefits, limitations and potential complications of the procedure were discussed with the patient/family (daughter present).  Complications from bronchoscopy are rare and most often minor, but if they occur they may include breathing difficulty, vocal cord spasm, hoarseness, slight fever, vomiting, dizziness, bronchospasm, infection, low blood oxygen, bleeding from biopsy site, or an allergic reaction to medications.  It is uncommon for patients to experience other more serious complications for example: Collapsed lung requiring chest tube placement, respiratory failure, heart attack and/or cardiac arrhythmia.  Patient agrees to proceed.  [name], MD Advanced Bronchoscopy PCCM [facility]    *This note was dictated using voice recognition software/Dragon.  Despite best efforts to proofread, errors can occur which can change the meaning. Any transcriptional errors that result from this process are unintentional and may not be fully corrected at the time of dictation.

## 2022-10-14 NOTE — Anesthesia Procedure Notes (Signed)
Procedure Name: Intubation Date/Time: 10/14/2022 12:54 PM  Performed by: Beverely Low, CRNAPre-anesthesia Checklist: Patient identified, Patient being monitored, Timeout performed, Emergency Drugs available and Suction available Patient Re-evaluated:Patient Re-evaluated prior to induction Oxygen Delivery Method: Circle system utilized Preoxygenation: Pre-oxygenation with 100% oxygen Induction Type: IV induction Ventilation: Mask ventilation without difficulty Laryngoscope Size: McGraph and 4 Grade View: Grade I Tube type: Oral Tube size: 9.0 mm Number of attempts: 1 Airway Equipment and Method: Stylet Placement Confirmation: ETT inserted through vocal cords under direct vision, positive ETCO2 and breath sounds checked- equal and bilateral Secured at: 21 cm Tube secured with: Tape Dental Injury: Teeth and Oropharynx as per pre-operative assessment

## 2022-10-14 NOTE — Transfer of Care (Signed)
Immediate Anesthesia Transfer of Care Note  Patient: Russell Simpson  Procedure(s) Performed: ROBOTIC ASSISTED NAVIGATIONAL BRONCHOSCOPY (Bilateral) VIDEO BRONCHOSCOPY WITH ENDOBRONCHIAL ULTRASOUND (Bilateral)  Patient Location: PACU  Anesthesia Type:General  Level of Consciousness: awake and oriented  Airway & Oxygen Therapy: Patient Spontanous Breathing and Patient connected to face mask oxygen  Post-op Assessment: Report given to RN and Post -op Vital signs reviewed and stable  Post vital signs: Reviewed and stable  Last Vitals:  Vitals Value Taken Time  BP 128/93 10/14/22 1447  Temp 35.9 C 10/14/22 1447  Pulse 88 10/14/22 1459  Resp 20 10/14/22 1459  SpO2 100 % 10/14/22 1459  Vitals shown include unvalidated device data.  Last Pain:  Vitals:   10/14/22 1447  TempSrc:   PainSc: 0-No pain         Complications: No notable events documented.

## 2022-10-15 ENCOUNTER — Ambulatory Visit: Payer: Medicare HMO

## 2022-10-15 ENCOUNTER — Encounter: Payer: Self-pay | Admitting: Pulmonary Disease

## 2022-10-15 ENCOUNTER — Telehealth: Payer: Self-pay

## 2022-10-15 LAB — SURGICAL PATHOLOGY

## 2022-10-15 MED ORDER — AZITHROMYCIN 500 MG PO TABS: 500.0000 mg | ORAL_TABLET | Freq: Every day | ORAL | 0 refills | Status: AC

## 2022-10-15 MED ORDER — ALBUTEROL SULFATE HFA 108 (90 BASE) MCG/ACT IN AERS: 2.0000 | INHALATION_SPRAY | Freq: Four times a day (QID) | RESPIRATORY_TRACT | 2 refills | Status: DC | PRN

## 2022-10-15 NOTE — Telephone Encounter (Signed)
Received voicemail from patient regarding bronch results.  He stated that he reviewed mychart and was under the impression that all results were negative. He is aware that it take days for bx results to come back. He also reports of prod cough with clear mucus and lower bilateral back discomfort. Denies bloody sputum, wheezing, SOB, f/c/s.  He stated that he has tried tessalon in the past and it was not effective for him. He stated that an inhaler was to be called in but it was not received at pharmacy.  Dr. Patsey Berthold, please advise. Thanks

## 2022-10-15 NOTE — Anesthesia Postprocedure Evaluation (Signed)
Anesthesia Post Note  Patient: Russell Simpson  Procedure(s) Performed: ROBOTIC ASSISTED NAVIGATIONAL BRONCHOSCOPY (Bilateral) VIDEO BRONCHOSCOPY WITH ENDOBRONCHIAL ULTRASOUND (Bilateral)  Patient location during evaluation: PACU Anesthesia Type: General Level of consciousness: awake and alert Pain management: pain level controlled Vital Signs Assessment: post-procedure vital signs reviewed and stable Respiratory status: spontaneous breathing, nonlabored ventilation and respiratory function stable Cardiovascular status: blood pressure returned to baseline and stable Postop Assessment: no apparent nausea or vomiting Anesthetic complications: no   No notable events documented.   Last Vitals:  Vitals:   10/14/22 1530 10/14/22 1535  BP: 102/75 133/78  Pulse: 91 93  Resp: 16 16  Temp: 36.7 C 36.4 C  SpO2: 99% 95%    Last Pain:  Vitals:   10/15/22 0858  TempSrc:   PainSc: 0-No pain                 [place] Ouch

## 2022-10-15 NOTE — Telephone Encounter (Signed)
Patient is aware of below recommendations and voiced his understanding.  Azithromycin and ventolin sent to preferred pharmacy.  Nothing further needed.

## 2022-10-15 NOTE — Telephone Encounter (Signed)
As I explained to him after the bronchoscopy, the results will be back till either Friday or the latest Monday.  We will call him as soon as we know the results.  Some issues with cough prior to the procedure he had a lot of congestion noted during the bronchoscopy.  We will treat him with Azithromycin 500 mg daily x3 days.  Also we can send him an albuterol inhaler to use as needed.

## 2022-10-20 ENCOUNTER — Encounter: Payer: Self-pay | Admitting: Pulmonary Disease

## 2022-10-20 ENCOUNTER — Ambulatory Visit: Payer: Medicare HMO | Admitting: Pulmonary Disease

## 2022-10-20 VITALS — BP 128/86 | HR 104 | Temp 97.9°F | Ht 74.0 in | Wt 246.4 lb

## 2022-10-20 DIAGNOSIS — Z23 Encounter for immunization: Secondary | ICD-10-CM | POA: Diagnosis not present

## 2022-10-20 DIAGNOSIS — J449 Chronic obstructive pulmonary disease, unspecified: Secondary | ICD-10-CM

## 2022-10-20 DIAGNOSIS — C8200 Follicular lymphoma grade I, unspecified site: Secondary | ICD-10-CM

## 2022-10-20 DIAGNOSIS — R918 Other nonspecific abnormal finding of lung field: Secondary | ICD-10-CM | POA: Diagnosis not present

## 2022-10-20 MED ORDER — TRELEGY ELLIPTA 100-62.5-25 MCG/ACT IN AEPB: 1.0000 | INHALATION_SPRAY | Freq: Every day | RESPIRATORY_TRACT | 11 refills | Status: DC

## 2022-10-20 NOTE — Patient Instructions (Signed)
Your new inhaler is called Trelegy this is 1 puff daily.  Make sure you rinse your mouth well after use.  Do not use the Spiriva any longer.  The Trelegy is your new inhaler.  You can still use your albuterol (rescue inhaler) if needed for shortness of breath.  We will see you in follow-up in 6 to 8 weeks time call sooner should any new problems arise.

## 2022-10-20 NOTE — Progress Notes (Unsigned)
Subjective:    Patient ID: Russell Simpson, male    DOB: 03-03-1961, 61 y.o.   MRN: [id number] Patient Care Team: Jon Billings, NP as PCP - General (Nurse Practitioner) Kate Sable, MD as PCP - Cardiology (Cardiology) Sindy Guadeloupe, MD as Consulting Physician (Oncology) Tyler Pita, MD as Consulting Physician (Pulmonary Disease)  Chief Complaint  Patient presents with   Follow-up    Prod cough with clear to white mucus.     HPI  Russell Simpson is a 61 year old current smoker (little cigars daily, however per patient's daughter he "chain smokes") who was initially evaluated on 25 June 2020 for issues with cough and gastroesophageal reflux..  He had issues with severe cough at times causing syncope.  He was having issues with severe gastroesophageal reflux.  At his initial visit we instituted a trial of Breztri 2 inhalations twice a day, PFTs were obtained to have confirmed moderate obstructive defect consistent with moderate COPD.  He had no bronchodilator response.  He was given lansoprazole for his GERD.  Patient was last seen on 12 September 2020 and then was lost to follow-up.  He was then evaluated on 17 September 2022 for lung nodules in the setting of follicular lymphoma and recurrent squamous cell cancer of the skin.  On surveillance CT chest performed on 23 March 2022 the patient was noted to have bibasilar lung nodules that were suspicious for metastatic disease however uncertain whether related to lymphoma or squamous cell skin cancer.  Both of this circumstances would be exceedingly rare however no other area of primary malignancy is noted.  Patient has been treated for his follicular lymphoma with 4 cycles of Bendamustine Rituxan.  He is lymphadenopathy responded to this.  Treatment was necessary due to severe splenomegaly.  Dr. Janese Banks discussed the findings with the patient on 3 May, however the patient kept postponing follow-up here October.  At that point the patient underwent  repeat CT chest and PET/CT.  The findings were consistent with possible follicular lymphoma.  However biopsy was warranted.  The patient underwent robotic assisted navigational bronchoscopy on 15 November however, the results were nondiagnostic.  The flow cytometry specimen was delayed in processing and the cells were not viable.  There was significant atelectasis during the procedure as these lesions were all posterior.  The patient presents today postprocedure.  He understands that he will be continued on surveillance between Dr. Janese Banks and myself.  We will continue to follow-up with CT scans of the chest.  Next CT scan of the chest will be done on 29 January.  Currently the patient any cough, sputum production or hemoptysis.  The patient however does note wheezing and shortness of breath.  He has been maintained on Spiriva Respimat for his COPD however he feels that it could be "stronger".  Wheezing is responsive to albuterol.  He unfortunately continues to engage in smoking he states he smokes small cigars however, his daughter previously has noted that he "chain smokes" cigarettes. He has not had any fevers, chills or sweats.  No dyspnea.  No hemoptysis.  No other concerns or complaints.  He has not had any difficulties after his bronchoscopy.  He requests flu vaccine today.  Review of Systems A 10 point review of systems was performed and it is as noted above otherwise negative.  Patient Active Problem List   Diagnosis Date Noted   Multiple lung nodules on CT 10/14/2022   Hilar adenopathy 10/14/2022   Closed fracture of metatarsal bone 09/01/2022  Sprain of ligament of tarsometatarsal joint 09/01/2022   Closed fracture of lateral malleolus 09/01/2022   Claustrophobia 08/05/2022   Closed fracture of distal fibula 05/28/2022   Lymphoma ([place]) 04/07/2022   Non-Hodgkin's lymphoma ([place]) 01/05/2022   Depression, recurrent ([place]) 01/05/2022   Tobacco abuse 01/05/2022   Gout 01/05/2022   Subacute  cough 11/10/2021   Sinus congestion 10/30/2021   Hyperglycemia 08/26/2021   Squamous cell carcinoma of neck    Chemotherapy induced neutropenia (HCC)    Stage 2 moderate COPD by GOLD classification ([place]) 05/14/2021   Cellulitis 05/14/2021   Cellulitis, neck 05/14/2021   COVID-19 virus infection 05/14/2021   Alcohol use disorder, severe, dependence ([place]) 98/33/8250   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 ([place]) 04/03/2021   Aortic atherosclerosis ([place]) 02/27/2021   High risk medication use 03/21/2020   Noncompliance with treatment regimen 02/19/2020   Erectile dysfunction due to arterial insufficiency 08/10/2019   Mixed bipolar I disorder ([place]) 07/19/2019   PTSD (post-traumatic stress disorder) 07/19/2019   Panic disorder 07/19/2019   Tobacco use disorder 07/19/2019   Moderate episode of recurrent major depressive disorder ([place]) 05/25/2019   S/P lumbar fusion 02/22/2019   Alcohol use disorder, severe, in early remission ([place]) 01/01/2019   Disorder of bursae of shoulder region 07/21/2017   Full thickness rotator cuff tear 07/21/2017   Localized, primary osteoarthritis 07/21/2017   Osteoarthritis of elbow 07/21/2017   Osteoarthritis of knee 07/21/2017   Anxiety 01/10/2016   BP (high blood pressure) 01/10/2016   Apnea, sleep 01/10/2016   Hyperlipidemia 10/15/2015   Pituitary adenoma ([place]) 10/15/2015   Hypogonadism in male 10/15/2015   Depression 10/15/2015   Impotence of organic origin 10/15/2015   Allergic rhinitis 10/15/2015   Incomplete bladder emptying 05/30/2015   Hernia, inguinal, left 09/21/2014   Benign prostatic hyperplasia with lower urinary tract symptoms 10/24/2012   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[place]) 09/05/2012   OSTEOMYELITIS, ACUTE, OTHER SPEC SITE 06/09/2006   Social History   Tobacco Use   Smoking status: Every Day    Types: Cigars   Smokeless tobacco: Never   Tobacco comments:    8 cigars a day 09/17/2022- khj  Substance Use Topics    Alcohol use: Yes    Comment: beers 6 a day   Allergies  Allergen Reactions   Penicillins Anaphylaxis    Tolerates cefdinir, cephalexin and amoxicillin/clavulonate so true PCN allergy unlikely   Tetanus Toxoids Swelling   Lisinopril Cough   Losartan      Other reaction(s): Muscle Pain     Codeine Nausea Only and Nausea And Vomiting   Doxycycline Rash   Ruxience [Rituximab-Pvvr] Rash    05/06/21 pt w/ worsening rash during Ruxience infusion.  Face, neck and chest flushing redness   Current Meds  Medication Sig   albuterol (VENTOLIN HFA) 108 (90 Base) MCG/ACT inhaler Inhale 2 puffs into the lungs every 6 (six) hours as needed for wheezing or shortness of breath.   allopurinol (ZYLOPRIM) 300 MG tablet TAKE 1 TABLET BY MOUTH ONCE DAILY (Patient taking differently: Take 300 mg by mouth every evening.)   amLODipine (NORVASC) 5 MG tablet Take 1 tablet (5 mg total) by mouth daily. (Patient taking differently: Take 5 mg by mouth every evening.)   atorvastatin (LIPITOR) 80 MG tablet Take 1 tablet (80 mg total) by mouth daily. (Patient taking differently: Take 80 mg by mouth every evening.)   buPROPion (WELLBUTRIN XL) 300 MG 24 hr tablet Take 1 tablet (300 mg total) by mouth  daily. (Patient taking differently: Take 300 mg by mouth every evening.)   calcipotriene (DOVONOX) 0.005 % cream Apply topically 2 (two) times daily. Apply twice a day after fluorouracil to affected areas as directed in handout. (Patient taking differently: Apply 1 Application topically daily. Apply twice a day after fluorouracil to affected areas as directed in handout.)   clonazePAM (KLONOPIN) 0.5 MG tablet Take 0.5 mg by mouth every evening.   DULoxetine (CYMBALTA) 60 MG capsule Take 60 mg by mouth every evening.   finasteride (PROSCAR) 5 MG tablet Take 1 tablet (5 mg total) by mouth daily. (Patient taking differently: Take 5 mg by mouth every evening.)   fluorouracil (EFUDEX) 5 % cream Apply topically 2 (two) times daily.  Apply twice a day to affected areas as directed in handout. (Patient taking differently: Apply 1 Application topically daily. Apply twice a day to affected areas as directed in handout.)   Fluticasone-Umeclidin-Vilant (TRELEGY ELLIPTA) 100-62.5-25 MCG/ACT AEPB Inhale 1 puff into the lungs daily.   loratadine (CLARITIN) 10 MG tablet Take 10 mg by mouth every evening.   metoprolol succinate (TOPROL-XL) 50 MG 24 hr tablet Take 2 tablets (100 mg total) by mouth daily. Take with or immediately following a meal. (Patient taking differently: Take 100 mg by mouth every evening. Take with or immediately following a meal.)   mirtazapine (REMERON) 15 MG tablet Take 15 mg by mouth at bedtime.   mupirocin ointment (BACTROBAN) 2 % Apply 1 Application topically daily. Apply to any open wounds until healed. (Patient taking differently: Apply 1 Application topically as needed. Apply to any open wounds until healed.)   OLANZapine (ZYPREXA) 5 MG tablet Take 5 mg by mouth at bedtime.   sildenafil (REVATIO) 20 MG tablet TAKE 3-5 TABLETS BY MOUTH ONCE DAILY AS NEEDED FOR ERECTILE DYSFUNCTION (Patient taking differently: Take 20 mg by mouth as needed.)   tenofovir (VIREAD) 300 MG tablet Take 300 mg by mouth every evening.   testosterone cypionate (DEPOTESTOSTERONE CYPIONATE) 200 MG/ML injection Inject 1 mL (200 mg total) into the muscle every 14 (fourteen) days.   traMADol (ULTRAM) 50 MG tablet Take 1 tablet by mouth every 6 (six) hours as needed.   [DISCONTINUED] albuterol (VENTOLIN HFA) 108 (90 Base) MCG/ACT inhaler Inhale 2 puffs into the lungs every 6 (six) hours as needed for wheezing or shortness of breath.   [DISCONTINUED] SPIRIVA RESPIMAT 2.5 MCG/ACT AERS INHALE 2 PUFFS INTO THE LUNGS ONCE DAILY. *RINSE MOUTH AFTER USE*   Immunization History  Administered Date(s) Administered   Influenza Split 08/05/2010, 09/30/2011   Influenza,inj,Quad PF,6+ Mos 09/25/2015, 08/31/2018, 08/21/2019, 08/30/2019, 09/12/2020    PFIZER Comirnaty(Gray Top)Covid-19 Tri-Sucrose Vaccine 02/27/2021   Tdap 05/29/2009       Objective:   Physical Exam BP 128/86 (BP Location: Left Arm, Cuff Size: Normal)   Pulse (!) 104   Temp 97.9 F (36.6 C) (Temporal)   Ht '6\' 2"'$  (1.88 m)   Wt 246 lb 6.4 oz (111.8 kg)   SpO2 95%   BMI 31.64 kg/m  GENERAL: Well-developed, overweight male fully ambulatory, no acute distress.  Plethoric appearing HEAD: Normocephalic, atraumatic.  EYES: Pupils equal, round, reactive to light.  No scleral icterus.  MOUTH: Nose/mouth/throat not examined due to masking requirements for COVID 19. NECK: Supple. No thyromegaly. Trachea midline. No JVD.  No adenopathy. PULMONARY: Good air entry bilaterally.  No adventitious sounds. CARDIOVASCULAR: S1 and S2. Regular rate and rhythm.  No rubs, murmurs or gallops heard. ABDOMEN: Benign. MUSCULOSKELETAL: No joint deformity, no  clubbing, no edema.  NEUROLOGIC: No overt focal deficit, no gait disturbance, speech is fluent. SKIN: Intact,warm,dry.  Plethoric. PSYCH: Somewhat impulsive, cooperative.       Assessment & Plan:     ICD-10-CM   1. Stage 2 moderate COPD by GOLD classification ([place])  J44.9    Patient has had PFTs ordered previously, these are pending Will switch Spiriva to Trelegy Ellipta 100, 1 puff daily Continue as needed albuterol    2. Multiple lung nodules on CT  R91.8    Will need to be placed on active surveillance Next CT chest on 28 December 2022 Continue follow-up with Dr. Janese Banks as well    3. Follicular lymphoma grade I, unspecified body region [facility])  C82.00    This issue adds complexity to his management Continue follow-up with Dr. Janese Banks    4. Need for immunization against influenza  Z23 Flu Vaccine QUAD 69moIM (Fluarix, Fluzone & Alfiuria Quad PF)   Patient received flu vaccine today     Orders Placed This Encounter  Procedures   Flu Vaccine QUAD 610moM (Fluarix, Fluzone & Alfiuria Quad PF)   Meds ordered this encounter   Medications   Fluticasone-Umeclidin-Vilant (TRELEGY ELLIPTA) 100-62.5-25 MCG/ACT AEPB    Sig: Inhale 1 puff into the lungs daily.    Dispense:  28 each    Refill:  11    Will see the patient in follow-up in 6 to 8 weeks time he is to call sooner should any new problems arise.  C.Renold DonMD Advanced Bronchoscopy PCCM  [facility]    *This note was dictated using voice recognition software/Dragon.  Despite best efforts to proofread, errors can occur which can change the meaning. Any transcriptional errors that result from this process are unintentional and may not be fully corrected at the time of dictation.

## 2022-10-21 ENCOUNTER — Encounter: Payer: Self-pay | Admitting: Pulmonary Disease

## 2022-10-29 ENCOUNTER — Ambulatory Visit (INDEPENDENT_AMBULATORY_CARE_PROVIDER_SITE_OTHER): Payer: Medicare HMO | Admitting: Physician Assistant

## 2022-10-29 VITALS — BP 123/87 | HR 101 | Temp 98.5°F | Wt 246.0 lb

## 2022-10-29 DIAGNOSIS — B9689 Other specified bacterial agents as the cause of diseases classified elsewhere: Secondary | ICD-10-CM

## 2022-10-29 DIAGNOSIS — J449 Chronic obstructive pulmonary disease, unspecified: Secondary | ICD-10-CM | POA: Diagnosis not present

## 2022-10-29 DIAGNOSIS — J019 Acute sinusitis, unspecified: Secondary | ICD-10-CM

## 2022-10-29 MED ORDER — CEFDINIR 300 MG PO CAPS: 300.0000 mg | ORAL_CAPSULE | Freq: Two times a day (BID) | ORAL | 0 refills | Status: DC

## 2022-10-29 MED ORDER — BENZONATATE 200 MG PO CAPS: 200.0000 mg | ORAL_CAPSULE | Freq: Two times a day (BID) | ORAL | 0 refills | Status: DC | PRN

## 2022-10-29 MED ORDER — CLINDAMYCIN HCL 300 MG PO CAPS: 300.0000 mg | ORAL_CAPSULE | Freq: Three times a day (TID) | ORAL | 0 refills | Status: DC

## 2022-10-29 MED ORDER — PREDNISONE 20 MG PO TABS: ORAL_TABLET | ORAL | 0 refills | Status: DC

## 2022-10-29 NOTE — Progress Notes (Signed)
Acute Office Visit   Patient: Russell Simpson   DOB: Nov 14, 1961   61 y.o. Male  MRN: [id number] Visit Date: 10/29/2022  Today's healthcare provider: Dani Gobble Estanislao Harmon, PA-C  Introduced myself to the patient as a Journalist, newspaper and provided education on APPs in clinical practice.    Chief Complaint  Patient presents with   Cough    Patient states he has been coughing, and has runny nose for about a week.    Subjective    Cough Associated symptoms include chills, a fever, myalgias, postnasal drip, rhinorrhea, a sore throat, shortness of breath and wheezing.   HPI     Cough    Additional comments: Patient states he has been coughing, and has runny nose for about a week.       Last edited by Louanna Raw, [place] on 10/29/2022  2:28 PM.      URI -type symptoms Reports nagging cough, rhinorrhea, wheezing,   Onset: gradual  Duration: over a week  Productive cough: intermittently productive but mostly dry cough  Sick contacts: not to his knowledge  Alleviating: sleeping  Aggravating: laying down seems to make it worse  Intervention: Nyquil  COVID testing at home: he has not tested for COVID at home   Result: NA   He has been using albuterol inhaler once per day  He has been using Trelegy as directed      Medications: Outpatient Medications Prior to Visit  Medication Sig   albuterol (VENTOLIN HFA) 108 (90 Base) MCG/ACT inhaler Inhale 2 puffs into the lungs every 6 (six) hours as needed for wheezing or shortness of breath.   allopurinol (ZYLOPRIM) 300 MG tablet TAKE 1 TABLET BY MOUTH ONCE DAILY (Patient taking differently: Take 300 mg by mouth every evening.)   amLODipine (NORVASC) 5 MG tablet Take 1 tablet (5 mg total) by mouth daily. (Patient taking differently: Take 5 mg by mouth every evening.)   atorvastatin (LIPITOR) 80 MG tablet Take 1 tablet (80 mg total) by mouth daily. (Patient taking differently: Take 80 mg by mouth every evening.)   buPROPion (WELLBUTRIN XL) 300 MG  24 hr tablet Take 1 tablet (300 mg total) by mouth daily. (Patient taking differently: Take 300 mg by mouth every evening.)   calcipotriene (DOVONOX) 0.005 % cream Apply topically 2 (two) times daily. Apply twice a day after fluorouracil to affected areas as directed in handout. (Patient taking differently: Apply 1 Application topically daily. Apply twice a day after fluorouracil to affected areas as directed in handout.)   clonazePAM (KLONOPIN) 0.5 MG tablet Take 0.5 mg by mouth every evening.   diazepam (VALIUM) 5 MG tablet Take 5 mg by mouth daily as needed.   DULoxetine (CYMBALTA) 60 MG capsule Take 60 mg by mouth every evening.   finasteride (PROSCAR) 5 MG tablet Take 1 tablet (5 mg total) by mouth daily. (Patient taking differently: Take 5 mg by mouth every evening.)   fluorouracil (EFUDEX) 5 % cream Apply topically 2 (two) times daily. Apply twice a day to affected areas as directed in handout. (Patient taking differently: Apply 1 Application topically daily. Apply twice a day to affected areas as directed in handout.)   Fluticasone-Umeclidin-Vilant (TRELEGY ELLIPTA) 100-62.5-25 MCG/ACT AEPB Inhale 1 puff into the lungs daily.   loratadine (CLARITIN) 10 MG tablet Take 10 mg by mouth every evening.   metoprolol succinate (TOPROL-XL) 50 MG 24 hr tablet Take 2 tablets (100 mg total) by mouth daily. Take  with or immediately following a meal. (Patient taking differently: Take 100 mg by mouth every evening. Take with or immediately following a meal.)   mirtazapine (REMERON) 15 MG tablet Take 15 mg by mouth at bedtime.   mupirocin ointment (BACTROBAN) 2 % Apply 1 Application topically daily. Apply to any open wounds until healed. (Patient taking differently: Apply 1 Application topically as needed. Apply to any open wounds until healed.)   OLANZapine (ZYPREXA) 5 MG tablet Take 5 mg by mouth at bedtime.   sildenafil (REVATIO) 20 MG tablet TAKE 3-5 TABLETS BY MOUTH ONCE DAILY AS NEEDED FOR ERECTILE  DYSFUNCTION (Patient taking differently: Take 20 mg by mouth as needed.)   tenofovir (VIREAD) 300 MG tablet Take 300 mg by mouth every evening.   testosterone cypionate (DEPOTESTOSTERONE CYPIONATE) 200 MG/ML injection Inject 1 mL (200 mg total) into the muscle every 14 (fourteen) days.   traMADol (ULTRAM) 50 MG tablet Take 1 tablet by mouth every 6 (six) hours as needed.   Facility-Administered Medications Prior to Visit  Medication Dose Route Frequency Provider   heparin lock flush 100 unit/mL  500 Units Intravenous Once Sindy Guadeloupe, MD   heparin lock flush 100 unit/mL  500 Units Intravenous Once Sindy Guadeloupe, MD   sodium chloride flush (NS) 0.9 % injection 10 mL  10 mL Intravenous PRN Sindy Guadeloupe, MD    Review of Systems  Constitutional:  Positive for chills and fever.  HENT:  Positive for postnasal drip, rhinorrhea and sore throat. Negative for congestion, sinus pressure and sinus pain.   Respiratory:  Positive for cough, shortness of breath and wheezing.   Gastrointestinal:  Negative for diarrhea, nausea and vomiting.  Musculoskeletal:  Positive for myalgias.       Objective    BP 123/87   Pulse (!) 101   Temp 98.5 F (36.9 C)   Wt 246 lb (111.6 kg)   SpO2 96%   BMI 31.58 kg/m    Physical Exam Vitals reviewed.  Constitutional:      General: He is awake.     Appearance: Normal appearance. He is well-developed and well-groomed.  HENT:     Head: Normocephalic and atraumatic.     Right Ear: Tympanic membrane, ear canal and external ear normal.     Left Ear: Tympanic membrane, ear canal and external ear normal.     Mouth/Throat:     Mouth: Mucous membranes are moist. Mucous membranes are pale.     Pharynx: Uvula midline. Posterior oropharyngeal erythema present. No pharyngeal swelling, oropharyngeal exudate or uvula swelling.  Cardiovascular:     Rate and Rhythm: Normal rate and regular rhythm.     Heart sounds: Normal heart sounds. Heart sounds not distant. No  murmur heard.    No friction rub.  Pulmonary:     Effort: Pulmonary effort is normal.     Breath sounds: Normal breath sounds. Decreased air movement present. No decreased breath sounds, wheezing, rhonchi or rales.  Lymphadenopathy:     Head:     Right side of head: No submental, submandibular or preauricular adenopathy.     Left side of head: No submental, submandibular or preauricular adenopathy.     Cervical:     Right cervical: No superficial or posterior cervical adenopathy.    Left cervical: No superficial or posterior cervical adenopathy.  Neurological:     General: No focal deficit present.     Mental Status: He is alert and oriented to person, place, and time.  Psychiatric:        Mood and Affect: Mood normal.        Behavior: Behavior normal. Behavior is cooperative.        Thought Content: Thought content normal.        Judgment: Judgment normal.       No results found for any visits on 10/29/22.  Assessment & Plan      No follow-ups on file.      Problem List Items Addressed This Visit       Respiratory   Stage 2 moderate COPD by GOLD classification ([place]) - Primary    Chronic, ongoing, concerned for acute exacerbation given current sinusitis and wheezing Reviewed use of inhalers and will send in Prednisone burst to assist with breathing  Will also send in script for Omnicef to assist with reducing exacerbation Sent in Tessalon pearls as well to assist with coughing, he can also take OTC medications to further assist with symptoms per preference Follow up as needed       Relevant Medications   predniSONE (DELTASONE) 20 MG tablet   benzonatate (TESSALON) 200 MG capsule   Other Visit Diagnoses     Acute bacterial rhinosinusitis     Acute, new concern Suspect bacterial rhinosinusitis at this time given HPI and PE  He is outside antiviral window for COVID and Flu - no testing today as it will not change treatment plan Omnicef and Prednisone sent in to  assist with symptoms  Reviewed ED and return precautions  Follow up as needed for persistent or progressing symptoms    Relevant Medications   cefdinir (OMNICEF) 300 MG capsule   clindamycin (CLEOCIN) 300 MG capsule   predniSONE (DELTASONE) 20 MG tablet   benzonatate (TESSALON) 200 MG capsule        No follow-ups on file.   I, Rhilynn Preyer E Rc Amison, PA-C, have reviewed all documentation for this visit. The documentation on 10/30/22 for the exam, diagnosis, procedures, and orders are all accurate and complete.   Talitha Givens, MHS, PA-C [facility]

## 2022-10-30 NOTE — Assessment & Plan Note (Signed)
Chronic, ongoing, concerned for acute exacerbation given current sinusitis and wheezing Reviewed use of inhalers and will send in Prednisone burst to assist with breathing  Will also send in script for Omnicef to assist with reducing exacerbation Sent in Tessalon pearls as well to assist with coughing, he can also take OTC medications to further assist with symptoms per preference Follow up as needed

## 2022-11-01 ENCOUNTER — Emergency Department: Payer: Medicare HMO

## 2022-11-01 DIAGNOSIS — A419 Sepsis, unspecified organism: Secondary | ICD-10-CM | POA: Diagnosis not present

## 2022-11-01 DIAGNOSIS — Z885 Allergy status to narcotic agent status: Secondary | ICD-10-CM | POA: Diagnosis not present

## 2022-11-01 DIAGNOSIS — R059 Cough, unspecified: Secondary | ICD-10-CM | POA: Diagnosis not present

## 2022-11-01 DIAGNOSIS — Z85828 Personal history of other malignant neoplasm of skin: Secondary | ICD-10-CM | POA: Diagnosis not present

## 2022-11-01 DIAGNOSIS — B338 Other specified viral diseases: Secondary | ICD-10-CM | POA: Diagnosis not present

## 2022-11-01 DIAGNOSIS — Z8572 Personal history of non-Hodgkin lymphomas: Secondary | ICD-10-CM

## 2022-11-01 DIAGNOSIS — Z20822 Contact with and (suspected) exposure to covid-19: Secondary | ICD-10-CM | POA: Diagnosis present

## 2022-11-01 DIAGNOSIS — Z888 Allergy status to other drugs, medicaments and biological substances status: Secondary | ICD-10-CM

## 2022-11-01 DIAGNOSIS — F419 Anxiety disorder, unspecified: Secondary | ICD-10-CM | POA: Diagnosis present

## 2022-11-01 DIAGNOSIS — J441 Chronic obstructive pulmonary disease with (acute) exacerbation: Secondary | ICD-10-CM | POA: Diagnosis present

## 2022-11-01 DIAGNOSIS — E669 Obesity, unspecified: Secondary | ICD-10-CM | POA: Diagnosis not present

## 2022-11-01 DIAGNOSIS — S0219XA Other fracture of base of skull, initial encounter for closed fracture: Secondary | ICD-10-CM | POA: Diagnosis not present

## 2022-11-01 DIAGNOSIS — F17290 Nicotine dependence, other tobacco product, uncomplicated: Secondary | ICD-10-CM | POA: Diagnosis present

## 2022-11-01 DIAGNOSIS — I5032 Chronic diastolic (congestive) heart failure: Secondary | ICD-10-CM | POA: Diagnosis not present

## 2022-11-01 DIAGNOSIS — R6520 Severe sepsis without septic shock: Secondary | ICD-10-CM | POA: Diagnosis present

## 2022-11-01 DIAGNOSIS — R0602 Shortness of breath: Secondary | ICD-10-CM | POA: Diagnosis not present

## 2022-11-01 DIAGNOSIS — F1020 Alcohol dependence, uncomplicated: Secondary | ICD-10-CM | POA: Diagnosis present

## 2022-11-01 DIAGNOSIS — Z86008 Personal history of in-situ neoplasm of other site: Secondary | ICD-10-CM

## 2022-11-01 DIAGNOSIS — I110 Hypertensive heart disease with heart failure: Secondary | ICD-10-CM | POA: Diagnosis present

## 2022-11-01 DIAGNOSIS — F32A Depression, unspecified: Secondary | ICD-10-CM | POA: Diagnosis present

## 2022-11-01 DIAGNOSIS — Z887 Allergy status to serum and vaccine status: Secondary | ICD-10-CM

## 2022-11-01 DIAGNOSIS — B974 Respiratory syncytial virus as the cause of diseases classified elsewhere: Secondary | ICD-10-CM | POA: Diagnosis not present

## 2022-11-01 DIAGNOSIS — Z6831 Body mass index (BMI) 31.0-31.9, adult: Secondary | ICD-10-CM

## 2022-11-01 DIAGNOSIS — Z881 Allergy status to other antibiotic agents status: Secondary | ICD-10-CM

## 2022-11-01 DIAGNOSIS — Z8249 Family history of ischemic heart disease and other diseases of the circulatory system: Secondary | ICD-10-CM

## 2022-11-01 DIAGNOSIS — J329 Chronic sinusitis, unspecified: Secondary | ICD-10-CM | POA: Diagnosis not present

## 2022-11-01 DIAGNOSIS — I6782 Cerebral ischemia: Secondary | ICD-10-CM | POA: Diagnosis not present

## 2022-11-01 DIAGNOSIS — J9601 Acute respiratory failure with hypoxia: Secondary | ICD-10-CM | POA: Diagnosis present

## 2022-11-01 DIAGNOSIS — N400 Enlarged prostate without lower urinary tract symptoms: Secondary | ICD-10-CM | POA: Diagnosis present

## 2022-11-01 DIAGNOSIS — M109 Gout, unspecified: Secondary | ICD-10-CM | POA: Diagnosis present

## 2022-11-01 DIAGNOSIS — E871 Hypo-osmolality and hyponatremia: Secondary | ICD-10-CM | POA: Diagnosis present

## 2022-11-01 DIAGNOSIS — Z833 Family history of diabetes mellitus: Secondary | ICD-10-CM

## 2022-11-01 DIAGNOSIS — R55 Syncope and collapse: Secondary | ICD-10-CM | POA: Diagnosis not present

## 2022-11-01 DIAGNOSIS — Z825 Family history of asthma and other chronic lower respiratory diseases: Secondary | ICD-10-CM

## 2022-11-01 DIAGNOSIS — Z880 Allergy status to penicillin: Secondary | ICD-10-CM | POA: Diagnosis not present

## 2022-11-01 DIAGNOSIS — Z79899 Other long term (current) drug therapy: Secondary | ICD-10-CM

## 2022-11-01 DIAGNOSIS — E860 Dehydration: Secondary | ICD-10-CM | POA: Diagnosis present

## 2022-11-01 DIAGNOSIS — E785 Hyperlipidemia, unspecified: Secondary | ICD-10-CM | POA: Diagnosis present

## 2022-11-01 DIAGNOSIS — F418 Other specified anxiety disorders: Secondary | ICD-10-CM | POA: Diagnosis not present

## 2022-11-01 DIAGNOSIS — Z7951 Long term (current) use of inhaled steroids: Secondary | ICD-10-CM

## 2022-11-01 LAB — RESP PANEL BY RT-PCR (RSV, FLU A&B, COVID)  RVPGX2
Influenza A by PCR: NEGATIVE
Influenza B by PCR: NEGATIVE
Resp Syncytial Virus by PCR: POSITIVE — AB
SARS Coronavirus 2 by RT PCR: NEGATIVE

## 2022-11-01 LAB — COMPREHENSIVE METABOLIC PANEL
ALT: 56 U/L — ABNORMAL HIGH (ref 0–44)
AST: 69 U/L — ABNORMAL HIGH (ref 15–41)
Albumin: 4.3 g/dL (ref 3.5–5.0)
Alkaline Phosphatase: 163 U/L — ABNORMAL HIGH (ref 38–126)
Anion gap: 12 (ref 5–15)
BUN: 12 mg/dL (ref 6–20)
CO2: 18 mmol/L — ABNORMAL LOW (ref 22–32)
Calcium: 8.5 mg/dL — ABNORMAL LOW (ref 8.9–10.3)
Chloride: 97 mmol/L — ABNORMAL LOW (ref 98–111)
Creatinine, Ser: 1.14 mg/dL (ref 0.61–1.24)
GFR, Estimated: >60 mL/min (ref >60)
Glucose, Bld: 104 mg/dL — ABNORMAL HIGH (ref 70–99)
Potassium: 3.8 mmol/L (ref 3.5–5.1)
Sodium: 127 mmol/L — ABNORMAL LOW (ref 135–145)
Total Bilirubin: 0.9 mg/dL (ref 0.3–1.2)
Total Protein: 6.9 g/dL (ref 6.5–8.1)

## 2022-11-01 LAB — CBC
HCT: 43.2 % (ref 39.0–52.0)
Hemoglobin: 15.6 g/dL (ref 13.0–17.0)
MCH: 33 pg (ref 26.0–34.0)
MCHC: 36.1 g/dL — ABNORMAL HIGH (ref 30.0–36.0)
MCV: 91.3 fL (ref 80.0–100.0)
Platelets: 143 10*3/uL — ABNORMAL LOW (ref 150–400)
RBC: 4.73 MIL/uL (ref 4.22–5.81)
RDW: 12.3 % (ref 11.5–15.5)
WBC: 8.4 10*3/uL (ref 4.0–10.5)
nRBC: 0 % (ref 0.0–0.2)

## 2022-11-01 LAB — GROUP A STREP BY PCR: Group A Strep by PCR: NOT DETECTED

## 2022-11-01 NOTE — ED Provider Triage Note (Signed)
Emergency Medicine Provider Triage Evaluation Note  Russell Simpson, a 61 y.o. male  was evaluated in triage.  Pt complains of presents to the ED with concern for possible RSV.  With a history of chronic COPD and supplemental oxygen use, reports his roommate was diagnosed with RSV earlier this week.  Review of Systems  Positive: Cough and sore throat Negative: FCS  Physical Exam  BP (!) 136/90 (BP Location: Left Arm)   Pulse (!) 105   Temp 98.5 F (36.9 C) (Oral)   Resp (!) 21   Ht '6\' 2"'$  (1.88 m)   Wt 111.6 kg   SpO2 93%   BMI 31.58 kg/m  Gen:   Awake, no distress  NAD Resp:  Normal effort CTA MSK:   Moves extremities without difficulty  Other:    Medical Decision Making  Medically screening exam initiated at 9:13 PM.  Appropriate orders placed.  Russell Simpson was informed that the remainder of the evaluation will be completed by another provider, this initial triage assessment does not replace that evaluation, and the importance of remaining in the ED until their evaluation is complete.  Patient to the ED for evaluation of cough and congestion, with concern for possible RSV exposure.   Melvenia Needles, PA-C 11/01/22 2115

## 2022-11-01 NOTE — ED Triage Notes (Signed)
Pt reports SOB since last Monday, roommate was dx with RSV, pt suspected he has it as well. Pt reports sore throat, cough, nasal congestion, and generalized body aches. Denies fevers, but reports chills. Afebrile at current, sats 89% on RA, pt has smoking hx and COPD, unsure of oxygen baseline. Oxygen applied in triage. GCS 15.

## 2022-11-02 ENCOUNTER — Inpatient Hospital Stay: Payer: Medicare HMO

## 2022-11-02 ENCOUNTER — Other Ambulatory Visit: Payer: Self-pay

## 2022-11-02 ENCOUNTER — Inpatient Hospital Stay
Admission: EM | Admit: 2022-11-02 | Discharge: 2022-11-04 | DRG: 871 | Disposition: A | Discharge status: Home / Self Care | Payer: Medicare HMO | Attending: Family Medicine | Admitting: Family Medicine

## 2022-11-02 DIAGNOSIS — B338 Other specified viral diseases: Secondary | ICD-10-CM | POA: Diagnosis not present

## 2022-11-02 DIAGNOSIS — J441 Chronic obstructive pulmonary disease with (acute) exacerbation: Secondary | ICD-10-CM | POA: Diagnosis present

## 2022-11-02 DIAGNOSIS — Z885 Allergy status to narcotic agent status: Secondary | ICD-10-CM | POA: Diagnosis not present

## 2022-11-02 DIAGNOSIS — J329 Chronic sinusitis, unspecified: Secondary | ICD-10-CM | POA: Diagnosis not present

## 2022-11-02 DIAGNOSIS — R55 Syncope and collapse: Secondary | ICD-10-CM | POA: Diagnosis not present

## 2022-11-02 DIAGNOSIS — Z8249 Family history of ischemic heart disease and other diseases of the circulatory system: Secondary | ICD-10-CM | POA: Diagnosis not present

## 2022-11-02 DIAGNOSIS — S0219XA Other fracture of base of skull, initial encounter for closed fracture: Secondary | ICD-10-CM | POA: Diagnosis not present

## 2022-11-02 DIAGNOSIS — Z887 Allergy status to serum and vaccine status: Secondary | ICD-10-CM | POA: Diagnosis not present

## 2022-11-02 DIAGNOSIS — Z85828 Personal history of other malignant neoplasm of skin: Secondary | ICD-10-CM | POA: Diagnosis not present

## 2022-11-02 DIAGNOSIS — F418 Other specified anxiety disorders: Secondary | ICD-10-CM | POA: Diagnosis not present

## 2022-11-02 DIAGNOSIS — Z720 Tobacco use: Secondary | ICD-10-CM | POA: Diagnosis present

## 2022-11-02 DIAGNOSIS — N400 Enlarged prostate without lower urinary tract symptoms: Secondary | ICD-10-CM | POA: Diagnosis present

## 2022-11-02 DIAGNOSIS — I6782 Cerebral ischemia: Secondary | ICD-10-CM | POA: Diagnosis not present

## 2022-11-02 DIAGNOSIS — M109 Gout, unspecified: Secondary | ICD-10-CM | POA: Diagnosis present

## 2022-11-02 DIAGNOSIS — Z880 Allergy status to penicillin: Secondary | ICD-10-CM | POA: Diagnosis not present

## 2022-11-02 DIAGNOSIS — E860 Dehydration: Secondary | ICD-10-CM | POA: Diagnosis present

## 2022-11-02 DIAGNOSIS — I10 Essential (primary) hypertension: Secondary | ICD-10-CM | POA: Diagnosis present

## 2022-11-02 DIAGNOSIS — R054 Cough syncope: Secondary | ICD-10-CM

## 2022-11-02 DIAGNOSIS — E785 Hyperlipidemia, unspecified: Secondary | ICD-10-CM | POA: Diagnosis present

## 2022-11-02 DIAGNOSIS — R6520 Severe sepsis without septic shock: Secondary | ICD-10-CM | POA: Diagnosis present

## 2022-11-02 DIAGNOSIS — F32A Depression, unspecified: Secondary | ICD-10-CM | POA: Diagnosis present

## 2022-11-02 DIAGNOSIS — A419 Sepsis, unspecified organism: Secondary | ICD-10-CM | POA: Diagnosis present

## 2022-11-02 DIAGNOSIS — E871 Hypo-osmolality and hyponatremia: Secondary | ICD-10-CM | POA: Diagnosis present

## 2022-11-02 DIAGNOSIS — J449 Chronic obstructive pulmonary disease, unspecified: Secondary | ICD-10-CM

## 2022-11-02 DIAGNOSIS — I5032 Chronic diastolic (congestive) heart failure: Secondary | ICD-10-CM | POA: Diagnosis present

## 2022-11-02 DIAGNOSIS — F1020 Alcohol dependence, uncomplicated: Secondary | ICD-10-CM | POA: Diagnosis present

## 2022-11-02 DIAGNOSIS — E669 Obesity, unspecified: Secondary | ICD-10-CM | POA: Diagnosis present

## 2022-11-02 DIAGNOSIS — F17290 Nicotine dependence, other tobacco product, uncomplicated: Secondary | ICD-10-CM | POA: Diagnosis present

## 2022-11-02 DIAGNOSIS — J9601 Acute respiratory failure with hypoxia: Secondary | ICD-10-CM | POA: Diagnosis not present

## 2022-11-02 DIAGNOSIS — Z881 Allergy status to other antibiotic agents status: Secondary | ICD-10-CM | POA: Diagnosis not present

## 2022-11-02 DIAGNOSIS — R7989 Other specified abnormal findings of blood chemistry: Secondary | ICD-10-CM | POA: Diagnosis present

## 2022-11-02 DIAGNOSIS — R0602 Shortness of breath: Secondary | ICD-10-CM | POA: Diagnosis present

## 2022-11-02 DIAGNOSIS — B974 Respiratory syncytial virus as the cause of diseases classified elsewhere: Secondary | ICD-10-CM | POA: Diagnosis present

## 2022-11-02 DIAGNOSIS — I110 Hypertensive heart disease with heart failure: Secondary | ICD-10-CM | POA: Diagnosis present

## 2022-11-02 DIAGNOSIS — F419 Anxiety disorder, unspecified: Secondary | ICD-10-CM | POA: Diagnosis present

## 2022-11-02 DIAGNOSIS — Z20822 Contact with and (suspected) exposure to covid-19: Secondary | ICD-10-CM | POA: Diagnosis present

## 2022-11-02 LAB — OSMOLALITY, URINE: Osmolality, Ur: 164 mOsm/kg — ABNORMAL LOW (ref 300–900)

## 2022-11-02 LAB — BASIC METABOLIC PANEL
Anion gap: 11 (ref 5–15)
Anion gap: 6 (ref 5–15)
BUN: 15 mg/dL (ref 6–20)
BUN: 16 mg/dL (ref 6–20)
CO2: 20 mmol/L — ABNORMAL LOW (ref 22–32)
CO2: 26 mmol/L (ref 22–32)
Calcium: 8.2 mg/dL — ABNORMAL LOW (ref 8.9–10.3)
Calcium: 8.3 mg/dL — ABNORMAL LOW (ref 8.9–10.3)
Chloride: 99 mmol/L (ref 98–111)
Chloride: 100 mmol/L (ref 98–111)
Creatinine, Ser: 1.11 mg/dL (ref 0.61–1.24)
Creatinine, Ser: 1.11 mg/dL (ref 0.61–1.24)
GFR, Estimated: >60 mL/min (ref >60)
GFR, Estimated: >60 mL/min (ref >60)
Glucose, Bld: 250 mg/dL — ABNORMAL HIGH (ref 70–99)
Glucose, Bld: 227 mg/dL — ABNORMAL HIGH (ref 70–99)
Potassium: 3.9 mmol/L (ref 3.5–5.1)
Potassium: 3.8 mmol/L (ref 3.5–5.1)
Sodium: 130 mmol/L — ABNORMAL LOW (ref 135–145)
Sodium: 132 mmol/L — ABNORMAL LOW (ref 135–145)

## 2022-11-02 LAB — CBC
HCT: 43.3 % (ref 39.0–52.0)
Hemoglobin: 15.1 g/dL (ref 13.0–17.0)
MCH: 32.1 pg (ref 26.0–34.0)
MCHC: 34.9 g/dL (ref 30.0–36.0)
MCV: 91.9 fL (ref 80.0–100.0)
Platelets: 131 10*3/uL — ABNORMAL LOW (ref 150–400)
RBC: 4.71 MIL/uL (ref 4.22–5.81)
RDW: 12.4 % (ref 11.5–15.5)
WBC: 4.5 10*3/uL (ref 4.0–10.5)
nRBC: 0 % (ref 0.0–0.2)

## 2022-11-02 LAB — TROPONIN I (HIGH SENSITIVITY): Troponin I (High Sensitivity): 9 ng/L (ref <18)

## 2022-11-02 LAB — LACTIC ACID, PLASMA
Lactic Acid, Venous: 3 mmol/L (ref 0.5–1.9)
Lactic Acid, Venous: 2.6 mmol/L (ref 0.5–1.9)

## 2022-11-02 LAB — PHOSPHORUS: Phosphorus: 4.2 mg/dL (ref 2.5–4.6)

## 2022-11-02 LAB — HIV ANTIBODY (ROUTINE TESTING W REFLEX): HIV Screen 4th Generation wRfx: NONREACTIVE

## 2022-11-02 LAB — SODIUM, URINE, RANDOM: Sodium, Ur: 15 mmol/L

## 2022-11-02 LAB — PROCALCITONIN: Procalcitonin: <0.1 ng/mL

## 2022-11-02 LAB — MAGNESIUM: Magnesium: 2.1 mg/dL (ref 1.7–2.4)

## 2022-11-02 LAB — BRAIN NATRIURETIC PEPTIDE: B Natriuretic Peptide: 91.6 pg/mL (ref 0.0–100.0)

## 2022-11-02 LAB — OSMOLALITY: Osmolality: 283 mOsm/kg (ref 275–295)

## 2022-11-02 MED ORDER — HYDROCOD POLI-CHLORPHE POLI ER 10-8 MG/5ML PO SUER
5.0000 mL | Freq: Once | ORAL | Status: AC
  Administered 2022-11-02: 5 mL via ORAL
  Filled 2022-11-02: qty 5

## 2022-11-02 MED ORDER — BUPROPION HCL ER (XL) 150 MG PO TB24
300.0000 mg | ORAL_TABLET | Freq: Every evening | ORAL | Status: DC
  Administered 2022-11-02 – 2022-11-03 (×2): 300 mg via ORAL
  Filled 2022-11-02 (×2): qty 2

## 2022-11-02 MED ORDER — THIAMINE HCL 100 MG/ML IJ SOLN
100.0000 mg | Freq: Every day | INTRAMUSCULAR | Status: DC
  Filled 2022-11-02: qty 2

## 2022-11-02 MED ORDER — BENZONATATE 100 MG PO CAPS
200.0000 mg | ORAL_CAPSULE | Freq: Two times a day (BID) | ORAL | Status: DC | PRN
  Administered 2022-11-02 (×2): 200 mg via ORAL
  Filled 2022-11-02 (×2): qty 2

## 2022-11-02 MED ORDER — LORAZEPAM 2 MG/ML IJ SOLN
0.0000 mg | Freq: Four times a day (QID) | INTRAMUSCULAR | Status: AC
  Administered 2022-11-02: 1 mg via INTRAVENOUS
  Administered 2022-11-04: 2 mg via INTRAVENOUS
  Administered 2022-11-04: 4 mg via INTRAVENOUS
  Filled 2022-11-02: qty 1
  Filled 2022-11-02: qty 2
  Filled 2022-11-02: qty 1

## 2022-11-02 MED ORDER — DIAZEPAM 5 MG PO TABS: 5.0000 mg | ORAL_TABLET | Freq: Every day | ORAL | Status: DC | PRN

## 2022-11-02 MED ORDER — TRAMADOL HCL 50 MG PO TABS
50.0000 mg | ORAL_TABLET | Freq: Four times a day (QID) | ORAL | Status: DC | PRN
  Administered 2022-11-03: 50 mg via ORAL
  Filled 2022-11-02: qty 1

## 2022-11-02 MED ORDER — TRAZODONE HCL 50 MG PO TABS
25.0000 mg | ORAL_TABLET | Freq: Every evening | ORAL | Status: DC | PRN
  Administered 2022-11-02 – 2022-11-03 (×2): 25 mg via ORAL
  Filled 2022-11-02 (×2): qty 1

## 2022-11-02 MED ORDER — SODIUM CHLORIDE 0.9 % IV BOLUS
500.0000 mL | Freq: Once | INTRAVENOUS | Status: AC
  Administered 2022-11-02: 500 mL via INTRAVENOUS

## 2022-11-02 MED ORDER — ALLOPURINOL 300 MG PO TABS
300.0000 mg | ORAL_TABLET | Freq: Every evening | ORAL | Status: DC
  Administered 2022-11-02 – 2022-11-03 (×2): 300 mg via ORAL
  Filled 2022-11-02 (×2): qty 1

## 2022-11-02 MED ORDER — ONDANSETRON HCL 4 MG/2ML IJ SOLN
4.0000 mg | Freq: Once | INTRAMUSCULAR | Status: AC
  Administered 2022-11-02: 4 mg via INTRAVENOUS
  Filled 2022-11-02: qty 2

## 2022-11-02 MED ORDER — SODIUM CHLORIDE 0.9 % IV BOLUS: 1000.0000 mL | Freq: Once | INTRAVENOUS | Status: DC

## 2022-11-02 MED ORDER — OLANZAPINE 5 MG PO TABS
5.0000 mg | ORAL_TABLET | Freq: Every day | ORAL | Status: DC
  Administered 2022-11-02 – 2022-11-03 (×2): 5 mg via ORAL
  Filled 2022-11-02 (×2): qty 1

## 2022-11-02 MED ORDER — ACETAMINOPHEN 325 MG PO TABS: 650.0000 mg | ORAL_TABLET | Freq: Four times a day (QID) | ORAL | Status: DC | PRN

## 2022-11-02 MED ORDER — METHYLPREDNISOLONE SODIUM SUCC 40 MG IJ SOLR: 40.0000 mg | Freq: Two times a day (BID) | INTRAMUSCULAR | Status: DC

## 2022-11-02 MED ORDER — FOLIC ACID 1 MG PO TABS
1.0000 mg | ORAL_TABLET | Freq: Every day | ORAL | Status: DC
  Administered 2022-11-02 – 2022-11-04 (×3): 1 mg via ORAL
  Filled 2022-11-02 (×3): qty 1

## 2022-11-02 MED ORDER — METHYLPREDNISOLONE SODIUM SUCC 125 MG IJ SOLR
125.0000 mg | Freq: Once | INTRAMUSCULAR | Status: AC
  Administered 2022-11-02: 125 mg via INTRAVENOUS
  Filled 2022-11-02: qty 2

## 2022-11-02 MED ORDER — PREDNISONE 20 MG PO TABS: 40.0000 mg | ORAL_TABLET | Freq: Every day | ORAL | Status: DC

## 2022-11-02 MED ORDER — METOPROLOL SUCCINATE ER 50 MG PO TB24
100.0000 mg | ORAL_TABLET | Freq: Every evening | ORAL | Status: DC
  Administered 2022-11-02 – 2022-11-03 (×2): 100 mg via ORAL
  Filled 2022-11-02 (×2): qty 2

## 2022-11-02 MED ORDER — IPRATROPIUM-ALBUTEROL 0.5-2.5 (3) MG/3ML IN SOLN
3.0000 mL | Freq: Once | RESPIRATORY_TRACT | Status: AC
  Administered 2022-11-02: 3 mL via RESPIRATORY_TRACT
  Filled 2022-11-02: qty 3

## 2022-11-02 MED ORDER — CALCIPOTRIENE 0.005 % EX CREA: 1.0000 | TOPICAL_CREAM | Freq: Every day | CUTANEOUS | Status: DC

## 2022-11-02 MED ORDER — IBUPROFEN 400 MG PO TABS: 200.0000 mg | ORAL_TABLET | Freq: Four times a day (QID) | ORAL | Status: DC | PRN

## 2022-11-02 MED ORDER — FLUOROURACIL 5 % EX CREA: 1.0000 | TOPICAL_CREAM | Freq: Every day | CUTANEOUS | Status: DC

## 2022-11-02 MED ORDER — ONDANSETRON HCL 4 MG/2ML IJ SOLN: 4.0000 mg | Freq: Four times a day (QID) | INTRAMUSCULAR | Status: DC | PRN

## 2022-11-02 MED ORDER — ALBUTEROL SULFATE (2.5 MG/3ML) 0.083% IN NEBU: 2.5000 mg | INHALATION_SOLUTION | RESPIRATORY_TRACT | Status: DC | PRN

## 2022-11-02 MED ORDER — HYDROCOD POLI-CHLORPHE POLI ER 10-8 MG/5ML PO SUER: 5.0000 mL | Freq: Two times a day (BID) | ORAL | Status: DC | PRN

## 2022-11-02 MED ORDER — ENOXAPARIN SODIUM 60 MG/0.6ML IJ SOSY
0.5000 mg/kg | PREFILLED_SYRINGE | INTRAMUSCULAR | Status: DC
  Administered 2022-11-02 – 2022-11-04 (×3): 55 mg via SUBCUTANEOUS
  Filled 2022-11-02 (×3): qty 0.6

## 2022-11-02 MED ORDER — MAGNESIUM HYDROXIDE 400 MG/5ML PO SUSP: 30.0000 mL | Freq: Every day | ORAL | Status: DC | PRN

## 2022-11-02 MED ORDER — LORATADINE 10 MG PO TABS
10.0000 mg | ORAL_TABLET | Freq: Every evening | ORAL | Status: DC
  Administered 2022-11-02 – 2022-11-03 (×2): 10 mg via ORAL
  Filled 2022-11-02 (×2): qty 1

## 2022-11-02 MED ORDER — METHYLPREDNISOLONE SODIUM SUCC 40 MG IJ SOLR
40.0000 mg | Freq: Two times a day (BID) | INTRAMUSCULAR | Status: DC
  Administered 2022-11-02 – 2022-11-04 (×5): 40 mg via INTRAVENOUS
  Filled 2022-11-02 (×5): qty 1

## 2022-11-02 MED ORDER — ADULT MULTIVITAMIN W/MINERALS CH
1.0000 | ORAL_TABLET | Freq: Every day | ORAL | Status: DC
  Administered 2022-11-02 – 2022-11-04 (×3): 1 via ORAL
  Filled 2022-11-02 (×3): qty 1

## 2022-11-02 MED ORDER — NICOTINE 21 MG/24HR TD PT24
21.0000 mg | MEDICATED_PATCH | Freq: Every day | TRANSDERMAL | Status: DC
  Administered 2022-11-02 – 2022-11-04 (×3): 21 mg via TRANSDERMAL
  Filled 2022-11-02 (×3): qty 1

## 2022-11-02 MED ORDER — DM-GUAIFENESIN ER 30-600 MG PO TB12
1.0000 | ORAL_TABLET | Freq: Two times a day (BID) | ORAL | Status: DC | PRN
  Administered 2022-11-02 (×2): 1 via ORAL
  Filled 2022-11-02 (×2): qty 1

## 2022-11-02 MED ORDER — MIRTAZAPINE 15 MG PO TABS
15.0000 mg | ORAL_TABLET | Freq: Every day | ORAL | Status: DC
  Administered 2022-11-02 – 2022-11-03 (×2): 15 mg via ORAL
  Filled 2022-11-02 (×2): qty 1

## 2022-11-02 MED ORDER — HYDROCOD POLI-CHLORPHE POLI ER 10-8 MG/5ML PO SUER
5.0000 mL | Freq: Two times a day (BID) | ORAL | Status: DC
  Administered 2022-11-02 – 2022-11-04 (×4): 5 mL via ORAL
  Filled 2022-11-02 (×4): qty 5

## 2022-11-02 MED ORDER — LORAZEPAM 1 MG PO TABS: 1.0000 mg | ORAL_TABLET | ORAL | Status: DC | PRN

## 2022-11-02 MED ORDER — ONDANSETRON HCL 4 MG PO TABS: 4.0000 mg | ORAL_TABLET | Freq: Four times a day (QID) | ORAL | Status: DC | PRN

## 2022-11-02 MED ORDER — AMLODIPINE BESYLATE 5 MG PO TABS
5.0000 mg | ORAL_TABLET | Freq: Every evening | ORAL | Status: DC
  Administered 2022-11-02 – 2022-11-03 (×2): 5 mg via ORAL
  Filled 2022-11-02 (×2): qty 1

## 2022-11-02 MED ORDER — DULOXETINE HCL 30 MG PO CPEP
60.0000 mg | ORAL_CAPSULE | Freq: Every evening | ORAL | Status: DC
  Administered 2022-11-02 – 2022-11-03 (×2): 60 mg via ORAL
  Filled 2022-11-02: qty 1
  Filled 2022-11-02: qty 2

## 2022-11-02 MED ORDER — MUPIROCIN 2 % EX OINT: 1.0000 | TOPICAL_OINTMENT | CUTANEOUS | Status: DC | PRN

## 2022-11-02 MED ORDER — SODIUM CHLORIDE 0.9 % IV SOLN: INTRAVENOUS | Status: DC

## 2022-11-02 MED ORDER — HYDRALAZINE HCL 20 MG/ML IJ SOLN: 5.0000 mg | INTRAMUSCULAR | Status: DC | PRN

## 2022-11-02 MED ORDER — CLONAZEPAM 0.5 MG PO TABS
0.5000 mg | ORAL_TABLET | Freq: Every evening | ORAL | Status: DC
  Administered 2022-11-02 – 2022-11-03 (×2): 0.5 mg via ORAL
  Filled 2022-11-02 (×2): qty 1

## 2022-11-02 MED ORDER — ACETAMINOPHEN 650 MG RE SUPP: 650.0000 mg | Freq: Four times a day (QID) | RECTAL | Status: DC | PRN

## 2022-11-02 MED ORDER — FINASTERIDE 5 MG PO TABS
5.0000 mg | ORAL_TABLET | Freq: Every evening | ORAL | Status: DC
  Administered 2022-11-02 – 2022-11-03 (×2): 5 mg via ORAL
  Filled 2022-11-02 (×2): qty 1

## 2022-11-02 MED ORDER — LORAZEPAM 2 MG/ML IJ SOLN: 0.0000 mg | Freq: Two times a day (BID) | INTRAMUSCULAR | Status: DC

## 2022-11-02 MED ORDER — TENOFOVIR DISOPROXIL FUMARATE 300 MG PO TABS
300.0000 mg | ORAL_TABLET | Freq: Every evening | ORAL | Status: DC
  Administered 2022-11-02 – 2022-11-03 (×2): 300 mg via ORAL
  Filled 2022-11-02 (×4): qty 1

## 2022-11-02 MED ORDER — ATORVASTATIN CALCIUM 80 MG PO TABS
80.0000 mg | ORAL_TABLET | Freq: Every evening | ORAL | Status: DC
  Administered 2022-11-02 – 2022-11-03 (×2): 80 mg via ORAL
  Filled 2022-11-02: qty 4
  Filled 2022-11-02 (×2): qty 1

## 2022-11-02 MED ORDER — LORAZEPAM 2 MG/ML IJ SOLN: 1.0000 mg | INTRAMUSCULAR | Status: DC | PRN

## 2022-11-02 MED ORDER — THIAMINE MONONITRATE 100 MG PO TABS
100.0000 mg | ORAL_TABLET | Freq: Every day | ORAL | Status: DC
  Administered 2022-11-02 – 2022-11-04 (×3): 100 mg via ORAL
  Filled 2022-11-02 (×3): qty 1

## 2022-11-02 MED ORDER — IPRATROPIUM-ALBUTEROL 0.5-2.5 (3) MG/3ML IN SOLN
3.0000 mL | RESPIRATORY_TRACT | Status: DC
  Administered 2022-11-02 – 2022-11-03 (×8): 3 mL via RESPIRATORY_TRACT
  Filled 2022-11-02 (×8): qty 3

## 2022-11-02 NOTE — ED Notes (Signed)
Pt to be admitted to 145, report given to [place], [place]. Belongings sent with pt. VSS at this time.

## 2022-11-02 NOTE — ED Notes (Signed)
Dr. Blaine Hamper notified of elevated lactid level 3.0. no new orders received will continue to monitor.

## 2022-11-02 NOTE — ED Provider Notes (Addendum)
[facility] Provider Note    Event Date/Time   First MD Initiated Contact with Patient 11/02/22 0303     (approximate)   History   Shortness of Breath   HPI  Russell Simpson is a 61 y.o. male with history of COPD not on oxygen, hypertension who presents emergency department several days of cough, body aches, congestion, shortness of breath, wheezing.  Hypoxic to 89% on room air here.  Does not wear oxygen at home.  Denies any chest pain, vomiting.  Reports multiple syncopal events after coughing.  States he feels terrible.  Not having any active chest pain.  Patient was started on cefdinir, clindamycin and prednisone by his outpatient provider on 10/29/2022.  History provided by patient.    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   left neck, Moh's 05/29/21   History of SCC (squamous cell carcinoma) of skin    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   left upper back ED&C done   Hypertension    Hyponatremia    Inappropriate sinus tachycardia    Pneumonia    SCC (squamous cell carcinoma) 08/28/2021   upper chest right of midline, EDC done 09/17/2021   SCC (squamous cell carcinoma) 09/30/2022   left chest  ED&C done   Sleep apnea    does not wear CPAP ; uses humidifier instead   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   right upper back, ED&C 06/18/2022    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   right upper back ED&C done   Squamous cell carcinoma of skin 02/19/2021   L inferior mandible, treated with EDC   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/28/2021   R ant shoulder - ED&C   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/28/2021   L upper abdomen - ED&C    Past Surgical History:  Procedure Laterality Date   ANTERIOR CERVICAL DECOMP/DISCECTOMY FUSION N/A 06/17/2016   Procedure: ANTERIOR CERVICAL DECOMPRESSION FUSION, CERVICAL 3-4, CERVICAL 4-5 WITH INSTRUMENTATION AND ALLOGRAFT;  Surgeon: Phylliss Bob, MD;  Location: [place];  Service: Orthopedics;  Laterality: N/A;  ANTERIOR CERVICAL DECOMPRESSION FUSION, CERVICAL 3-4, CERVICAL 4-5 WITH INSTRUMENTATION AND ALLOGRAFT   BACK SURGERY     x3   NECK SURGERY  12/01/2007   PORTA CATH INSERTION N/A 04/03/2021   Procedure: PORTA CATH INSERTION;  Surgeon: Algernon Huxley, MD;  Location: [place] [facility];  Service: Cardiovascular;  Laterality: N/A;   VIDEO BRONCHOSCOPY WITH ENDOBRONCHIAL ULTRASOUND Bilateral 10/14/2022   Procedure: VIDEO BRONCHOSCOPY WITH ENDOBRONCHIAL ULTRASOUND;  Surgeon: Tyler Pita, MD;  Location: [facility];  Service: Pulmonary;  Laterality: Bilateral;    MEDICATIONS:  Prior to Admission medications   Medication Sig Start Date End Date Taking? Authorizing Provider  albuterol (VENTOLIN HFA) 108 (90 Base)  MCG/ACT inhaler Inhale 2 puffs into the lungs every 6 (six) hours as needed for wheezing or shortness of breath. 10/15/22   Tyler Pita, MD  allopurinol (ZYLOPRIM) 300 MG tablet TAKE 1 TABLET BY MOUTH ONCE DAILY Patient taking differently: Take 300 mg by mouth every evening. 09/04/21   Sindy Guadeloupe, MD  amLODipine (NORVASC) 5 MG tablet Take 1 tablet (5 mg total) by mouth daily. Patient taking differently: Take 5 mg by mouth every evening. 08/11/22   Kathrine Haddock, NP  atorvastatin (LIPITOR) 80 MG tablet Take 1 tablet (80 mg total) by mouth daily. Patient taking  differently: Take 80 mg by mouth every evening. 06/26/22 06/21/23  Kate Sable, MD  benzonatate (TESSALON) 200 MG capsule Take 1 capsule (200 mg total) by mouth 2 (two) times daily as needed for cough. 10/29/22   Mecum, Erin E, PA-C  buPROPion (WELLBUTRIN XL) 300 MG 24 hr tablet Take 1 tablet (300 mg total) by mouth daily. Patient taking differently: Take 300 mg by mouth every evening. 04/07/22   Vigg, Avanti, MD  calcipotriene (DOVONOX) 0.005 % cream Apply topically 2 (two) times daily. Apply twice a day after fluorouracil to affected areas as directed in handout. Patient taking differently: Apply 1 Application topically daily. Apply twice a day after fluorouracil to affected areas as directed in handout. 05/27/22   Moye, [place], MD  cefdinir (OMNICEF) 300 MG capsule Take 1 capsule (300 mg total) by mouth 2 (two) times daily for 7 days. 10/29/22 11/05/22  Mecum, Erin E, PA-C  clindamycin (CLEOCIN) 300 MG capsule Take 1 capsule (300 mg total) by mouth 3 (three) times daily for 7 days. 10/29/22 11/05/22  Mecum, Erin E, PA-C  clonazePAM (KLONOPIN) 0.5 MG tablet Take 0.5 mg by mouth every evening.    Ander Slade, PA-C  diazepam (VALIUM) 5 MG tablet Take 5 mg by mouth daily as needed. 08/05/22   [provider]  DULoxetine (CYMBALTA) 60 MG capsule Take 60 mg by mouth every evening. 07/01/22   [provider]  finasteride (PROSCAR) 5 MG tablet Take 1 tablet (5 mg total) by mouth daily. Patient taking differently: Take 5 mg by mouth every evening. 02/25/22   Stoioff, Ronda Fairly, MD  fluorouracil (EFUDEX) 5 % cream Apply topically 2 (two) times daily. Apply twice a day to affected areas as directed in handout. Patient taking differently: Apply 1 Application topically daily. Apply twice a day to affected areas as directed in handout. 05/27/22   Moye, [place], MD  Fluticasone-Umeclidin-Vilant (TRELEGY ELLIPTA) 100-62.5-25 MCG/ACT AEPB Inhale 1 puff into the lungs daily. 10/20/22   Tyler Pita, MD  loratadine (CLARITIN) 10 MG tablet Take 10 mg by mouth every evening.    [provider]  metoprolol succinate (TOPROL-XL) 50 MG 24 hr tablet Take 2 tablets (100 mg total) by mouth daily. Take with or immediately following a meal. Patient taking differently: Take 100 mg by mouth every evening. Take with or immediately following a meal. 08/11/22 11/09/22  Kathrine Haddock, NP  mirtazapine (REMERON) 15 MG tablet Take 15 mg by mouth at bedtime. 06/30/22   [provider]  mupirocin ointment (BACTROBAN) 2 % Apply 1 Application topically daily. Apply to any open wounds until healed. Patient taking differently: Apply 1 Application topically as needed. Apply to any open wounds until healed. 05/27/22   Moye, [place], MD  OLANZapine (ZYPREXA) 5 MG tablet Take 5 mg by mouth at bedtime. 01/28/22   [provider]  predniSONE (DELTASONE) 20  MG tablet Take '60mg'$  PO daily x 2 days, then'40mg'$  PO daily x 2 days, then '20mg'$  PO daily x 3 days 10/29/22   Mecum, Erin E, PA-C  sildenafil (REVATIO) 20 MG tablet TAKE 3-5 TABLETS BY MOUTH ONCE DAILY AS NEEDED FOR ERECTILE DYSFUNCTION Patient taking differently: Take 20 mg by mouth as needed. 07/29/21   Stoioff, Ronda Fairly, MD  tenofovir (VIREAD) 300 MG tablet Take 300 mg by mouth every evening.    [provider]  testosterone cypionate (DEPOTESTOSTERONE CYPIONATE) 200 MG/ML injection Inject 1 mL (200 mg total) into the muscle every 14 (fourteen) days. 12/19/21   Stoioff, Ronda Fairly, MD  traMADol (ULTRAM) 50 MG tablet Take 1 tablet by mouth every 6 (six) hours as needed.    [provider]    Physical Exam   Triage Vital Signs: ED Triage Vitals  Enc Vitals Group     BP 11/01/22 2110 (!) 136/90     Pulse Rate 11/01/22 2110 (!) 105     Resp 11/01/22 2110 (!) 21     Temp 11/01/22 2110 98.5 F (36.9 C)     Temp Source 11/01/22 2110 Oral     SpO2 11/01/22 2110 93 %     Weight 11/01/22 2111 246 lb (111.6 kg)     Height  11/01/22 2111 '6\' 2"'$  (1.88 m)     Head Circumference --      Peak Flow --      Pain Score 11/01/22 2111 10     Pain Loc --      Pain Edu? --      Excl. in [place]? --     Most recent vital signs: Vitals:   11/01/22 2110 11/02/22 0134  BP: (!) 136/90 (!) 159/97  Pulse: (!) 105 91  Resp: (!) 21 (!) 21  Temp: 98.5 F (36.9 C) 98.2 F (36.8 C)  SpO2: 93% 93%    CONSTITUTIONAL: Alert and oriented and responds appropriately to questions.  Chronically ill-appearing HEAD: Normocephalic, atraumatic EYES: Conjunctivae clear, pupils appear equal, sclera nonicteric ENT: normal nose; moist mucous membranes NECK: Supple, normal ROM CARD: Regular and tachycardic; S1 and S2 appreciated; no murmurs, no clicks, no rubs, no gallops RESP: Normal chest excursion without splinting or tachypnea; breath sounds equal bilaterally.  Scattered expiratory wheezes.  No rhonchi or rales.  Hypoxic to 89% on room air at rest.  No respiratory distress. ABD/GI: Normal bowel sounds; non-distended; soft, non-tender, no rebound, no guarding, no peritoneal signs BACK: The back appears normal EXT: Normal ROM in all joints; no deformity noted, no edema; no cyanosis, no calf tenderness or calf swelling SKIN: Normal color for age and race; warm; no rash on exposed skin NEURO: Moves all extremities equally, normal speech PSYCH: The patient's mood and manner are appropriate.   ED Results / Procedures / Treatments   LABS: (all labs ordered are listed, but only abnormal results are displayed) Labs Reviewed  RESP PANEL BY RT-PCR (RSV, FLU A&B, COVID)  RVPGX2 - Abnormal; Notable for the following components:      Result Value   Resp Syncytial Virus by PCR POSITIVE (*)    All other components within normal limits  COMPREHENSIVE METABOLIC PANEL - Abnormal; Notable for the following components:   Sodium 127 (*)    Chloride 97 (*)    CO2 18 (*)    Glucose, Bld 104 (*)    Calcium 8.5 (*)    AST 69 (*)    ALT 56 (*)  Alkaline Phosphatase 163 (*)    All other components within normal limits  CBC - Abnormal; Notable for the following components:   MCHC 36.1 (*)    Platelets 143 (*)    All other components within normal limits  GROUP A STREP BY PCR  TROPONIN I (HIGH SENSITIVITY)     EKG:  Date: 11/02/2022 3:50 AM  Rate: 96  Rhythm: normal sinus rhythm  QRS Axis: normal  Intervals: normal  ST/T Wave abnormalities: normal  Conduction Disutrbances: none  Narrative Interpretation: unremarkable       RADIOLOGY: My personal review and interpretation of imaging: Chest x-ray shows no opacity or edema.  I have personally reviewed all radiology reports.   DG Chest 2 View  Result Date: 11/01/2022 CLINICAL DATA:  Shortness of breath. EXAM: CHEST - 2 VIEW COMPARISON:  10/14/2022 and prior studies FINDINGS: A RIGHT IJ Port-A-Cath is again noted with tip overlying the UPPER SVC. The cardiomediastinal silhouette is unremarkable. Mild interstitial prominence is unchanged. Nodules identified on prior CTs are difficult to definitely visualize radiographically. There is no evidence of focal airspace disease, pulmonary edema, pleural effusion, or pneumothorax. No acute bony abnormalities are identified. IMPRESSION: 1. No evidence of acute cardiopulmonary disease. 2. Unchanged mild interstitial prominence. No new pulmonary nodules are difficult to definitely identify radiographically. Electronically Signed   By: Margarette Canada M.D.   On: 11/01/2022 21:35     PROCEDURES:  Critical Care performed: Yes, see critical care procedure note(s)   CRITICAL CARE Performed by: Cyril Mourning Cristofer Yaffe   Total critical care time: 40 minutes  Critical care time was exclusive of separately billable procedures and treating other patients.  Critical care was necessary to treat or prevent imminent or life-threatening deterioration.  Critical care was time spent personally by me on the following activities: development of treatment plan  with patient and/or surrogate as well as nursing, discussions with consultants, evaluation of patient's response to treatment, examination of patient, obtaining history from patient or surrogate, ordering and performing treatments and interventions, ordering and review of laboratory studies, ordering and review of radiographic studies, pulse oximetry and re-evaluation of patient's condition.   [name]1-3 Lead EKG Interpretation  Performed by: Jamaury Gumz, Delice Bison, DO Authorized by: Rivky Clendenning, Delice Bison, DO     Interpretation: normal     ECG rate:  91   ECG rate assessment: normal     Rhythm: sinus rhythm     Ectopy: none     Conduction: normal       IMPRESSION / MDM / ASSESSMENT AND PLAN / ED COURSE  I reviewed the triage vital signs and the nursing notes.    Patient here with shortness of breath, wheezing, hypoxia, cough and body aches.  The patient is on the cardiac monitor to evaluate for evidence of arrhythmia and/or significant heart rate changes.   DIFFERENTIAL DIAGNOSIS (includes but not limited to):   Viral URI, pneumonia, COPD exacerbation, PE, less likely ACS, CHF   Patient's presentation is most consistent with acute presentation with potential threat to life or bodily function.   PLAN: Will Obtain CBC, BMP, troponin, chest x-ray, COVID and flu swabs.  Will give breathing treatments, steroids.  Will give Tussionex for symptomatic relief.  Reports multiple syncopal events with coughing.  EKG shows no arrhythmia or ischemic change.  Suspect that these are from cough syncope rather than arrhythmia, ACS.  MEDICATIONS GIVEN IN ED: Medications  ipratropium-albuterol (DUONEB) 0.5-2.5 (3) MG/3ML nebulizer solution 3 mL (has no administration in time range)  methylPREDNISolone sodium succinate (SOLU-MEDROL) 125 mg/2 mL injection 125 mg (has no administration in time range)  chlorpheniramine-HYDROcodone (TUSSIONEX) 10-8 MG/5ML suspension 5 mL (has no administration in time range)   ondansetron (ZOFRAN) injection 4 mg (has no administration in time range)     ED COURSE: Labs show mild hyponatremia.  Slightly elevated liver function test which she has had previously.  No leukocytosis.  Troponin negative.  Positive for RSV.  Negative for flu, COVID and strep.  Chest x-ray reviewed and interpreted by myself and the radiologist and shows no infiltrate or edema.  Given his new oxygen requirement here, patient will need admission.  Will discuss with hospitalist.   CONSULTS:  Consulted and discussed patient's case with hospitalist, Dr. Sidney Ace.  I have recommended admission and consulting physician agrees and will place admission orders.  Patient (and family if present) agree with this plan.   I reviewed all nursing notes, vitals, pertinent previous records.  All labs, EKGs, imaging ordered have been independently reviewed and interpreted by myself.    OUTSIDE RECORDS REVIEWED: Reviewed patient's last bronchoscopy with Dr. Patsey Berthold on 10/14/2022 and office note on 10/29/2022.       FINAL CLINICAL IMPRESSION(S) / ED DIAGNOSES   Final diagnoses:  RSV (respiratory syncytial virus infection)  COPD exacerbation ([place])  Acute respiratory failure with hypoxia ([place])  Cough syncope syndrome     Rx / DC Orders   ED Discharge Orders     None        Note:  This document was prepared using Dragon voice recognition software and may include unintentional dictation errors.       Kavontae Pritchard, Delice Bison, DO 11/02/22 [phone]

## 2022-11-02 NOTE — H&P (Signed)
History and Physical    Russell Simpson QQV:[id number] DOB: 1961-04-08 DOA: 11/02/2022  Referring MD/NP/PA:   PCP: Jon Billings, NP   Patient coming from:  The patient is coming from home.  At baseline, pt is independent for most of ADL.        Chief Complaint: Shortness breath  HPI: Russell Simpson is a 61 y.o. male with medical history significant of tobacco abuse, alcohol abuse, hypertension, hyperlipidemia, COPD not oxygen, asthma, gout, depression with anxiety, BPH, skin cancer, OSA not on CPAP, diastolic CHF, PTSD, bipolar, obesity with BMI 31.58, who presents with shortness breath.  Patient states that he has cough with clear mucus production, and shortness of breath for more than a week.  Patient was started on clindamycin, Omnicef and prednisone by MD on 11/30, but symptoms have not improved.  He continues to have shortness breath which has been progressively worsening.  He has diarrhea, 3 times of watery diarrhea each day, no nausea, vomiting or abdominal pain.  Denies symptoms of UTI.  He states that he has severe cough that caused him passing out several times.  Denies any injury.  No urinary numbness or tinglings extremities with no facial droop or slurred speech.   Patient is normally not using oxygen, but was found to have oxygen desaturation to 89% on room air.  Patient has difficulty speaking in full sentence.  Patient is seen acute respiratory distress.  Data reviewed independently and ED Course: pt was found to have positive RSV, negative COVID PCR, negative rapid strep, WBC 8.4, troponin level 9, sodium 127, magnesium 2.1, phosphorus of 4.2, GFR >60, mild abnormal liver function (ALP 163, AST 69, ALT 56, total bilirubin 0.9), temperature normal, blood pressure 112/71, heart rate 105, RR 22, oxygen saturation 89% on room air, which improved to 99% on 3 L oxygen.  Chest x-ray negative.  CT of head is negative for acute intra-abdominal issues.  Patient is admitted to telemetry bed  as inpatient.   CT-head: No evidence of acute intracranial abnormality.   Mild-to-moderate chronic small vessel ischemic changes within the cerebral white matter.   Chronic fracture deformities of the left frontal calvarium and medial left orbit.  EKG: Not done in ED, will get one.     Review of Systems:   General: no fevers, chills, no body weight gain, has poor appetite, has fatigue HEENT: no blurry vision, hearing changes or sore throat Respiratory: has dyspnea, coughing, wheezing CV: no chest pain, no palpitations GI: no nausea, vomiting, abdominal pain, has diarrhea, no constipation GU: no dysuria, burning on urination, increased urinary frequency, hematuria  Ext: no leg edema Neuro: no unilateral weakness, numbness, or tingling, no vision change or hearing loss. Has syncope Skin: no rash, no skin tear. MSK: No muscle spasm, no deformity, no limitation of range of movement in spin Heme: No easy bruising.  Travel history: No recent long distant travel.   Allergy:  Allergies  Allergen Reactions   Penicillins Anaphylaxis    Tolerates cefdinir, cephalexin and amoxicillin/clavulonate so true PCN allergy unlikely   Tetanus Toxoids Swelling   Lisinopril Cough   Losartan      Other reaction(s): Muscle Pain     Codeine Nausea Only and Nausea And Vomiting   Doxycycline Rash   Ruxience [Rituximab-Pvvr] Rash    05/06/21 pt w/ worsening rash during Ruxience infusion.  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[place])    Arthritis  Asthma    Basal cell carcinoma 01/28/2021   R upper arm, [facility] 03/04/2021   Brain 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   Chronic pain    right arm   COPD (chronic obstructive pulmonary disease) ([place])    Depression    Dyspnea    Elevated liver enzymes    Environmental and seasonal allergies    Femur fracture, left ([place]) 5638   Follicular lymphoma ([place])     History of methicillin resistant staphylococcus aureus (MRSA)    years ago   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   left neck, Moh's 05/29/21   History of SCC (squamous cell carcinoma) of skin    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   left upper back ED&C done   Hypertension    Hyponatremia    Inappropriate sinus tachycardia    Pneumonia    SCC (squamous cell carcinoma) 08/28/2021   upper chest right of midline, EDC done 09/17/2021   SCC (squamous cell carcinoma) 09/30/2022   left chest  ED&C done   Sleep apnea    does not wear CPAP ; uses humidifier instead   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   right upper back, ED&C 06/18/2022   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   right upper back ED&C done   Squamous cell carcinoma of skin 02/19/2021   L inferior mandible, treated with EDC   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/28/2021   R ant shoulder - ED&C   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/28/2021   L upper abdomen - ED&C    Past Surgical History:  Procedure Laterality Date   ANTERIOR CERVICAL DECOMP/DISCECTOMY FUSION N/A 06/17/2016   Procedure: ANTERIOR CERVICAL DECOMPRESSION FUSION, CERVICAL 3-4, CERVICAL 4-5 WITH INSTRUMENTATION AND ALLOGRAFT;  Surgeon: Phylliss Bob, MD;  Location: [facility];  Service: Orthopedics;  Laterality: N/A;  ANTERIOR CERVICAL DECOMPRESSION FUSION, CERVICAL 3-4, CERVICAL 4-5 WITH INSTRUMENTATION AND ALLOGRAFT   BACK SURGERY     x3   NECK SURGERY  12/01/2007   PORTA CATH INSERTION N/A 04/03/2021   Procedure: PORTA CATH INSERTION;  Surgeon: Algernon Huxley, MD;  Location: [facility];  Service: Cardiovascular;  Laterality: N/A;   VIDEO BRONCHOSCOPY WITH ENDOBRONCHIAL ULTRASOUND Bilateral 10/14/2022   Procedure: VIDEO BRONCHOSCOPY WITH ENDOBRONCHIAL ULTRASOUND;  Surgeon: Tyler Pita, MD;  Location: [facility];  Service: Pulmonary;  Laterality: Bilateral;    Social History:   reports that he has been smoking cigars. He has never used smokeless tobacco. He reports current alcohol use. He reports that he does not use drugs.  Family History:  Family History  Problem Relation Age of Onset   Diabetes Mother    Heart attack Father    Emphysema Father    Diabetes Other      Prior to Admission medications   Medication Sig Start Date End Date Taking? Authorizing Provider  albuterol (VENTOLIN HFA) 108 (90 Base) MCG/ACT inhaler Inhale 2 puffs into the lungs every 6 (six) hours as needed for wheezing or shortness of breath. 10/15/22  Yes Tyler Pita, MD  allopurinol (ZYLOPRIM) 300 MG tablet TAKE 1 TABLET BY MOUTH ONCE DAILY Patient taking differently: Take 300 mg by mouth every evening. 09/04/21  Yes Sindy Guadeloupe, MD  amLODipine (NORVASC) 5 MG tablet Take 1 tablet (5 mg total) by mouth daily. Patient taking differently: Take 5 mg by mouth every evening. 08/11/22  Yes Wicker,  Malachy Mood, NP  atorvastatin (LIPITOR) 80 MG tablet Take 1 tablet (80 mg total) by mouth daily. Patient taking differently: Take 80 mg by mouth every evening. 06/26/22 06/21/23 Yes Agbor-Etang, Aaron Edelman, MD  benzonatate (TESSALON) 200 MG capsule Take 1 capsule (200 mg total) by mouth 2 (two) times daily as needed for cough. 10/29/22  Yes Mecum, Erin E, PA-C  buPROPion (WELLBUTRIN XL) 300 MG 24 hr tablet Take 1 tablet (300 mg total) by mouth daily. Patient taking differently: Take 300 mg by mouth every evening. 04/07/22  Yes Vigg, Avanti, MD  calcipotriene (DOVONOX) 0.005 % cream Apply topically 2 (two) times daily. Apply twice a day after fluorouracil to affected areas as directed in handout. Patient taking differently: Apply 1 Application topically daily. Apply twice a day after fluorouracil to affected areas as directed in handout. 05/27/22  Yes Moye, [place], MD  cefdinir (OMNICEF) 300 MG capsule Take 1 capsule (300 mg total) by mouth 2 (two) times daily for 7 days. 10/29/22 11/05/22 Yes Mecum, Erin E,  PA-C  clindamycin (CLEOCIN) 300 MG capsule Take 1 capsule (300 mg total) by mouth 3 (three) times daily for 7 days. 10/29/22 11/05/22 Yes Mecum, Erin E, PA-C  clonazePAM (KLONOPIN) 0.5 MG tablet Take 0.5 mg by mouth every evening.   Yes Ander Slade, PA-C  DULoxetine (CYMBALTA) 60 MG capsule Take 60 mg by mouth every evening. 07/01/22  Yes [provider]  finasteride (PROSCAR) 5 MG tablet Take 1 tablet (5 mg total) by mouth daily. Patient taking differently: Take 5 mg by mouth every evening. 02/25/22  Yes Stoioff, Ronda [place]ly, MD  fluorouracil (EFUDEX) 5 % cream Apply topically 2 (two) times daily. Apply twice a day to affected areas as directed in handout. Patient taking differently: Apply 1 Application topically daily. Apply twice a day to affected areas as directed in handout. 05/27/22  Yes Moye, [place], MD  Fluticasone-Umeclidin-Vilant (TRELEGY ELLIPTA) 100-62.5-25 MCG/ACT AEPB Inhale 1 puff into the lungs daily. 10/20/22  Yes Tyler Pita, MD  loratadine (CLARITIN) 10 MG tablet Take 10 mg by mouth every evening.   Yes [provider]  metoprolol succinate (TOPROL-XL) 50 MG 24 hr tablet Take 2 tablets (100 mg total) by mouth daily. Take with or immediately following a meal. Patient taking differently: Take 100 mg by mouth every evening. Take with or immediately following a meal. 08/11/22 11/09/22 Yes Kathrine Haddock, NP  mirtazapine (REMERON) 15 MG tablet Take 15 mg by mouth at bedtime. 06/30/22  Yes [provider]  OLANZapine (ZYPREXA) 5 MG tablet Take 5 mg by mouth at bedtime. 01/28/22  Yes [provider]  predniSONE (DELTASONE) 20 MG tablet Take '60mg'$  PO daily x 2 days, then'40mg'$  PO daily x 2 days, then '20mg'$  PO daily x 3 days 10/29/22  Yes Mecum, Erin E, PA-C  tenofovir (VIREAD) 300 MG tablet Take 300 mg by mouth every evening.   Yes [provider]  diazepam (VALIUM) 5 MG tablet Take 5 mg by mouth daily as needed. Patient not taking: Reported on  11/02/2022 08/05/22   [provider]  mupirocin ointment (BACTROBAN) 2 % Apply 1 Application topically daily. Apply to any open wounds until healed. Patient taking differently: Apply 1 Application topically as needed. Apply to any open wounds until healed. 05/27/22   Moye, [place], MD  sildenafil (REVATIO) 20 MG tablet TAKE 3-5 TABLETS BY MOUTH ONCE DAILY AS NEEDED FOR ERECTILE DYSFUNCTION Patient taking differently: Take 20 mg by mouth as needed. 07/29/21   Stoioff,  Ronda [place], MD  testosterone cypionate (DEPOTESTOSTERONE CYPIONATE) 200 MG/ML injection Inject 1 mL (200 mg total) into the muscle every 14 (fourteen) days. Patient not taking: Reported on 11/02/2022 12/19/21   Abbie Sons, MD  traMADol (ULTRAM) 50 MG tablet Take 1 tablet by mouth every 6 (six) hours as needed.    [provider]    Physical Exam: Vitals:   11/02/22 0426 11/02/22 0430 11/02/22 0945 11/02/22 1100  BP: 112/71  (!) 144/93 (!) 134/104  Pulse: (!) 102  92 (!) 103  Resp: (!) 22  20   Temp:   97.9 F (36.6 C)   TempSrc:   Oral   SpO2: (!) 89% 99% 94%   Weight:      Height:       General: In acute respiratory distress HEENT:       Eyes: PERRL, EOMI, no scleral icterus.       ENT: No discharge from the ears and nose, no pharynx injection, no tonsillar enlargement.        Neck: No JVD, no bruit, no mass felt. Heme: No neck lymph node enlargement. Cardiac: S1/S2, RRR, No murmurs, No gallops or rubs. Respiratory: Has wheezing bilaterally GI: Soft, nondistended, nontender, no rebound pain, no organomegaly, BS present. GU: No hematuria Ext: No pitting leg edema bilaterally. 1+DP/PT pulse bilaterally. Musculoskeletal: No joint deformities, No joint redness or warmth, no limitation of ROM in spin. Skin: No rashes.  Neuro: Alert, oriented X3, cranial nerves II-XII grossly intact, moves all extremities normally. Psych: Patient is not psychotic, no suicidal or hemocidal ideation.  Labs on Admission: I  have personally reviewed following labs and imaging studies  CBC: Recent Labs  Lab 11/01/22 2116 11/02/22 1101  WBC 8.4 4.5  HGB 15.6 15.1  HCT 43.2 43.3  MCV 91.3 91.9  PLT 143* 703*   Basic Metabolic Panel: Recent Labs  Lab 11/01/22 2116 11/02/22 1101  NA 127* 130*  K 3.8 3.9  CL 97* 99  CO2 18* 20*  GLUCOSE 104* 250*  BUN 12 15  CREATININE 1.14 1.11  CALCIUM 8.5* 8.2*  MG  --  2.1  PHOS  --  4.2   GFR: Estimated Creatinine Clearance: 94.1 mL/min (by C-G formula based on SCr of 1.11 mg/dL). Liver Function Tests: Recent Labs  Lab 11/01/22 2116  AST 69*  ALT 56*  ALKPHOS 163*  BILITOT 0.9  PROT 6.9  ALBUMIN 4.3   No results for input(s): "LIPASE", "AMYLASE" in the last 168 hours. No results for input(s): "AMMONIA" in the last 168 hours. Coagulation Profile: No results for input(s): "INR", "PROTIME" in the last 168 hours. Cardiac Enzymes: No results for input(s): "CKTOTAL", "CKMB", "CKMBINDEX", "TROPONINI" in the last 168 hours. BNP (last 3 results) No results for input(s): "PROBNP" in the last 8760 hours. HbA1C: No results for input(s): "HGBA1C" in the last 72 hours. CBG: No results for input(s): "GLUCAP" in the last 168 hours. Lipid Profile: No results for input(s): "CHOL", "HDL", "LDLCALC", "TRIG", "CHOLHDL", "LDLDIRECT" in the last 72 hours. Thyroid Function Tests: No results for input(s): "TSH", "T4TOTAL", "FREET4", "T3FREE", "THYROIDAB" in the last 72 hours. Anemia Panel: No results for input(s): "VITAMINB12", "FOLATE", "FERRITIN", "TIBC", "IRON", "RETICCTPCT" in the last 72 hours. Urine analysis:    Component Value Date/Time   COLORURINE YELLOW (A) 05/14/2021 1059   APPEARANCEUR Clear 04/07/2022 1440   LABSPEC 1.003 (L) 05/14/2021 1059   LABSPEC 1.017 07/19/2013 0635   PHURINE 6.0 05/14/2021 1059   GLUCOSEU Negative 04/07/2022  1440   GLUCOSEU Negative 07/19/2013 0635   HGBUR NEGATIVE 05/14/2021 1059   BILIRUBINUR Negative 04/07/2022 1440    BILIRUBINUR Negative 07/19/2013 0635   KETONESUR 5 (A) 05/14/2021 1059   PROTEINUR Negative 04/07/2022 1440   PROTEINUR NEGATIVE 05/14/2021 1059   UROBILINOGEN 0.2 10/02/2019 1818   NITRITE Negative 04/07/2022 1440   NITRITE NEGATIVE 05/14/2021 1059   LEUKOCYTESUR Negative 04/07/2022 1440   LEUKOCYTESUR NEGATIVE 05/14/2021 1059   LEUKOCYTESUR Negative 07/19/2013 0635   Sepsis Labs: '@LABRCNTIP'$ (procalcitonin:4,lacticidven:4) ) Recent Results (from the past 240 hour(s))  Resp panel by RT-PCR (RSV, Flu A&B, Covid) Anterior Nasal Swab     Status: Abnormal   Collection Time: 11/01/22  9:16 PM   Specimen: Anterior Nasal Swab  Result Value Ref Range Status   SARS Coronavirus 2 by RT PCR NEGATIVE NEGATIVE Final    Comment: (NOTE) SARS-CoV-2 target nucleic acids are NOT DETECTED.  The SARS-CoV-2 RNA is generally detectable in upper respiratory specimens during the acute phase of infection. The lowest concentration of SARS-CoV-2 viral copies this assay can detect is 138 copies/mL. A negative result does not preclude SARS-Cov-2 infection and should not be used as the sole basis for treatment or other patient management decisions. A negative result may occur with  improper specimen collection/handling, submission of specimen other than nasopharyngeal swab, presence of viral mutation(s) within the areas targeted by this assay, and inadequate number of viral copies(<138 copies/mL). A negative result must be combined with clinical observations, patient history, and epidemiological information. The expected result is Negative.  Fact Sheet for Patients:  EntrepreneurPulse.com.au  Fact Sheet for Healthcare Providers:  IncredibleEmployment.be  This test is no t yet approved or cleared by the [place] FDA and  has been authorized for detection and/or diagnosis of SARS-CoV-2 by FDA under an Emergency Use Authorization (EUA). This EUA will remain  in  effect (meaning this test can be used) for the duration of the COVID-19 declaration under Section 564(b)(1) of the Act, 21 U.S.C.section 360bbb-3(b)(1), unless the authorization is terminated  or revoked sooner.       Influenza A by PCR NEGATIVE NEGATIVE Final   Influenza B by PCR NEGATIVE NEGATIVE Final    Comment: (NOTE) The Xpert Xpress SARS-CoV-2/FLU/RSV plus assay is intended as an aid in the diagnosis of influenza from Nasopharyngeal swab specimens and should not be used as a sole basis for treatment. Nasal washings and aspirates are unacceptable for Xpert Xpress SARS-CoV-2/FLU/RSV testing.  Fact Sheet for Patients: EntrepreneurPulse.com.au  Fact Sheet for Healthcare Providers: IncredibleEmployment.be  This test is not yet approved or cleared by the [place] FDA and has been authorized for detection and/or diagnosis of SARS-CoV-2 by FDA under an Emergency Use Authorization (EUA). This EUA will remain in effect (meaning this test can be used) for the duration of the COVID-19 declaration under Section 564(b)(1) of the Act, 21 U.S.C. section 360bbb-3(b)(1), unless the authorization is terminated or revoked.     Resp Syncytial Virus by PCR POSITIVE (A) NEGATIVE Final    Comment: (NOTE) Fact Sheet for Patients: EntrepreneurPulse.com.au  Fact Sheet for Healthcare Providers: IncredibleEmployment.be  This test is not yet approved or cleared by the [place] FDA and has been authorized for detection and/or diagnosis of SARS-CoV-2 by FDA under an Emergency Use Authorization (EUA). This EUA will remain in effect (meaning this test can be used) for the duration of the COVID-19 declaration under Section 564(b)(1) of the Act, 21 U.S.C. section 360bbb-3(b)(1), unless the authorization is terminated or revoked.  Performed at [facility], [address] [place], [place] [address]   Group  A Strep by PCR     Status: None   Collection Time: 11/01/22  9:16 PM   Specimen: Anterior Nasal Swab; Sterile Swab  Result Value Ref Range Status   Group A Strep by PCR NOT DETECTED NOT DETECTED Final    Comment: Performed at [facility], [address], [place], [place] [address]     Radiological Exams on Admission: CT HEAD WO CONTRAST (5MM)  Result Date: 11/02/2022 CLINICAL DATA:  Provided history: Syncope/presyncope, cerebrovascular cause suspected. Additional history provided: Shortness of breath, sore throat, cough, nasal congestion, body aches, possible RSV. EXAM: CT HEAD WITHOUT CONTRAST TECHNIQUE: Contiguous axial images were obtained from the base of the skull through the vertex without intravenous contrast. RADIATION DOSE REDUCTION: This exam was performed according to the departmental dose-optimization program which includes automated exposure control, adjustment of the mA and/or kV according to patient size and/or use of iterative reconstruction technique. COMPARISON:  CT 11/21/2019. FINDINGS: Brain: Moderate generalized cerebral atrophy. Mild-to-moderate patchy and ill-defined hypoattenuation within the cerebral white matter, nonspecific but compatible with chronic small vessel ischemic disease. There is no acute intracranial hemorrhage. No demarcated cortical infarct. No extra-axial fluid collection. No evidence of an intracranial mass. No midline shift. Vascular: No hyperdense vessel. Skull: Chronic fracture deformity of the left frontal calvarium (with involvement of the outer table of the left frontal sinus). No acute calvarial fracture or aggressive osseous lesion. Sinuses/Orbits: No mass or acute finding within the imaged orbits. Chronic medially displaced fracture deformity of the left lamina papyracea. Mild-to-moderate mucosal thickening within the bilateral maxillary sinuses. Mild mucosal thickening within the right sphenoid sinus. Mild-to-moderate mucosal thickening  within the left sphenoid sinus. Mild mucosal thickening within the bilateral ethmoid air cells. IMPRESSION: No evidence of acute intracranial abnormality. Mild-to-moderate chronic small vessel ischemic changes within the cerebral white matter. Chronic fracture deformities of the left frontal calvarium and medial left orbit. Paranasal sinus disease, as described. Electronically Signed   By: Kellie Simmering D.O.   On: 11/02/2022 10:07   DG Chest 2 View  Result Date: 11/01/2022 CLINICAL DATA:  Shortness of breath. EXAM: CHEST - 2 VIEW COMPARISON:  10/14/2022 and prior studies FINDINGS: A RIGHT IJ Port-A-Cath is again noted with tip overlying the UPPER SVC. The cardiomediastinal silhouette is unremarkable. Mild interstitial prominence is unchanged. Nodules identified on prior CTs are difficult to definitely visualize radiographically. There is no evidence of focal airspace disease, pulmonary edema, pleural effusion, or pneumothorax. No acute bony abnormalities are identified. IMPRESSION: 1. No evidence of acute cardiopulmonary disease. 2. Unchanged mild interstitial prominence. No new pulmonary nodules are difficult to definitely identify radiographically. Electronically Signed   By: Margarette Canada M.D.   On: 11/01/2022 21:35      Assessment/Plan Principal Problem:   Acute respiratory failure with hypoxia (HCC) Active Problems:   COPD exacerbation (HCC)   Severe sepsis (HCC)   RSV infection   Hyponatremia   Abnormal LFTs   HLD (hyperlipidemia)   Syncope   Gout   Depression with anxiety   Alcohol use disorder, severe, dependence (HCC)   Tobacco abuse   BPH (benign prostatic hyperplasia)   Chronic diastolic CHF (congestive heart failure) (HCC)   Obesity with body mass index (BMI) of 30.0 to 39.9   HTN (hypertension)   Assessment and Plan:   Acute respiratory failure with hypoxia and severe sepsis due to  COPD exacerbation 2/2 RSV infection:  pt meets criteria for severe sepsis heart rate of 105,  heart 2, patient has 3 L new oxygen requirement.   -will admit to tele bed as inpatient -Bronchodilators -Solu-Medrol 40 mg IV bid -Mucinex for cough  -Incentive spirometry -sputum culture -Nasal cannula oxygen as needed to maintain O2 saturation 93% or greater -will get Procalcitonin and trend lactic acid levels per sepsis protocol. -IVF: 500 ml x 2 of NS bolus, followed by 100 cc/h (patient has diastolic CHF, limiting aggressive IV fluid resuscitation)  Hyponatremia: Na 127. likely due to poor oral intake and dehydration and potomania - Will check urine sodium, urine osmolality, serum osmolality. - Fluid restriction - IVF: 1L NS in ED, will continue with IV normal saline at 100 mL/h - f/u by BMP q8h - avoid over correction too fast due to risk of central pontine myelinolysis  Abnormal LFTs: mild, likely due to Alcohol alcohol use -Avoid using Tylenol  HLD (hyperlipidemia) -Lipitor  Syncope: Possibly due to dehydration versus vasovagal syncope.  CT head negative for acute intracranial abnormalities. -Frequent neurocheck -IV fluid as above -Check orthostatic vital sign  Gout -Allopurinol  Depression with anxiety -Continue home medications  Alcohol use disorder, severe, dependence ([place]) and Tobacco abuse -Did counseling about importance of quitting alcohol abuse and tobacco use -Nicotine patch -CIWA protocol  BPH (benign prostatic hyperplasia) -Proscar  Hypertension: -IV hydralazine as needed -Metoprolol, amlodipine  Chronic diastolic CHF: 2D echo 04/07/3266 showed EF of 60 to 65% with grade 1 diastolic dysfunction.  Patient does not have leg edema.  Does not seem to have CHF exacerbation -Check BNP  Obesity with body mass index (BMI) of 30.0 to 39.9: BMI 31.58, body weight 111.6 kg -Diet and exercise.   -Encourage to lose weight.       DVT ppx: SQ Lovenox  Code Status: Full code  Family Communication: not done, no family member is at bed side.        Disposition Plan:  Anticipate discharge back to previous environment  Consults called:  none  Admission status and Level of care: Telemetry Medical:    as inpt       Dispo: The patient is from: Home              Anticipated d/c is to: Home              Anticipated d/c date is: 2 days              Patient currently is not medically stable to d/c.    Severity of Illness:  The appropriate patient status for this patient is INPATIENT. Inpatient status is judged to be reasonable and necessary in order to provide the required intensity of service to ensure the patient's safety. The patient's presenting symptoms, physical exam findings, and initial radiographic and laboratory data in the context of their chronic comorbidities is felt to place them at high risk for further clinical deterioration. Furthermore, it is not anticipated that the patient will be medically stable for discharge from the hospital within 2 midnights of admission.   * I certify that at the point of admission it is my clinical judgment that the patient will require inpatient hospital care spanning beyond 2 midnights from the point of admission due to high intensity of service, high risk for further deterioration and high frequency of surveillance required.*       Date of Service 11/02/2022    Ivor Costa [facility]   If 7PM-7AM, please contact night-coverage [email or web address]  11/02/2022, 1:22 PM

## 2022-11-02 NOTE — Progress Notes (Signed)
PHARMACIST - PHYSICIAN COMMUNICATION  CONCERNING:  Enoxaparin (Lovenox) for DVT Prophylaxis    RECOMMENDATION: Patient was prescribed enoxaprin '40mg'$  q24 hours for VTE prophylaxis.   Filed Weights   11/01/22 2111  Weight: 111.6 kg (246 lb)    Body mass index is 31.58 kg/m.  Estimated Creatinine Clearance: 91.6 mL/min (by C-G formula based on SCr of 1.14 mg/dL).   Based on [place] patient is candidate for enoxaparin 0.'5mg'$ /kg TBW SQ every 24 hours based on BMI being >30.  DESCRIPTION: Pharmacy has adjusted enoxaparin dose per [facility] policy.  Patient is now receiving enoxaparin 0.5 mg/kg every 24 hours   Renda Rolls, PharmD, [facility] 11/02/2022 4:59 AM

## 2022-11-03 ENCOUNTER — Other Ambulatory Visit: Payer: Self-pay

## 2022-11-03 DIAGNOSIS — J9601 Acute respiratory failure with hypoxia: Secondary | ICD-10-CM | POA: Diagnosis not present

## 2022-11-03 LAB — GLUCOSE, CAPILLARY
Glucose-Capillary: 91 mg/dL (ref 70–99)
Glucose-Capillary: 96 mg/dL (ref 70–99)

## 2022-11-03 LAB — BASIC METABOLIC PANEL WITH GFR
Anion gap: 6 (ref 5–15)
Anion gap: 10 (ref 5–15)
BUN: 18 mg/dL (ref 6–20)
BUN: 18 mg/dL (ref 6–20)
CO2: 27 mmol/L (ref 22–32)
CO2: 24 mmol/L (ref 22–32)
Calcium: 7.9 mg/dL — ABNORMAL LOW (ref 8.9–10.3)
Calcium: 8.2 mg/dL — ABNORMAL LOW (ref 8.9–10.3)
Chloride: 102 mmol/L (ref 98–111)
Chloride: 102 mmol/L (ref 98–111)
Creatinine, Ser: 0.9 mg/dL (ref 0.61–1.24)
Creatinine, Ser: 0.99 mg/dL (ref 0.61–1.24)
GFR, Estimated: >60 mL/min
GFR, Estimated: >60 mL/min
Glucose, Bld: 155 mg/dL — ABNORMAL HIGH (ref 70–99)
Glucose, Bld: 257 mg/dL — ABNORMAL HIGH (ref 70–99)
Potassium: 4 mmol/L (ref 3.5–5.1)
Potassium: 4.1 mmol/L (ref 3.5–5.1)
Sodium: 135 mmol/L (ref 135–145)
Sodium: 136 mmol/L (ref 135–145)

## 2022-11-03 MED ORDER — MENTHOL 3 MG MT LOZG
1.0000 | LOZENGE | OROMUCOSAL | Status: DC | PRN
  Administered 2022-11-03: 3 mg via ORAL
  Filled 2022-11-03: qty 9

## 2022-11-03 MED ORDER — IPRATROPIUM-ALBUTEROL 0.5-2.5 (3) MG/3ML IN SOLN
3.0000 mL | Freq: Two times a day (BID) | RESPIRATORY_TRACT | Status: DC
  Administered 2022-11-03 – 2022-11-04 (×2): 3 mL via RESPIRATORY_TRACT
  Filled 2022-11-03 (×2): qty 3

## 2022-11-03 MED ORDER — INSULIN ASPART 100 UNIT/ML IJ SOLN
0.0000 [IU] | Freq: Three times a day (TID) | INTRAMUSCULAR | Status: DC
  Administered 2022-11-04: 2 [IU] via SUBCUTANEOUS
  Filled 2022-11-03: qty 1

## 2022-11-03 NOTE — Progress Notes (Signed)
PROGRESS NOTE  Baden Betsch  XHB:[id number] DOB: 12-01-1960 DOA: 11/02/2022 PCP: Jon Billings, NP   Brief Narrative:  Patient is a 61 year old male with history of  alcohol abuse/smoker, hypertension, hyperlipidemia, COPD not on oxygen, asthma, gout, depression, skin cancer, OSA not on CPAP, diastolic CHF, morbid obesity who presented to the emergency department with shortness of breath, sputum production.  He was recently treated by his PCP with Omnicef, clindamycin but symptoms did not improve.  Continued to have shortness of breath with progressive worsening.  Also report of diarrhea.  On presentation he was hypoxic on room air. RSV PCR came out to be positive.  Patient was put on 3 L of oxygen.  Currently managed with IV steroids  Assessment & Plan:  Principal Problem:   Acute respiratory failure with hypoxia (HCC) Active Problems:   COPD exacerbation (HCC)   Severe sepsis (HCC)   RSV infection   Hyponatremia   Abnormal LFTs   HLD (hyperlipidemia)   Syncope   Gout   Depression with anxiety   Alcohol use disorder, severe, dependence (HCC)   Tobacco abuse   BPH (benign prostatic hyperplasia)   Chronic diastolic CHF (congestive heart failure) (HCC)   Obesity with body mass index (BMI) of 30.0 to 39.9   HTN (hypertension)   Acute hypoxic respiratory failure: Hypoxic on presentation.  Not on oxygen at home.  Currently on 3 L of oxygen per minute.  Secondary to RSV causing COPD exacerbation.  Continue to wean the oxygen  COPD exacerbation: Secondary to RSV.  Started on IV steroids.  Continue incentive spirometer, bronchodilators.  Wean the oxygen as tolerated.  Hyponatremia: resolved  with IV fluid.  He drinks 12 bottles of 12 ounce beer every day so most likely secondary to beer potomania  Hyperlipidemia: On Lipitor  Syncope: Reported of passing out at home.  Most likely from dehydration.  CT head negative for acute findings. Continue gentle IV fluids  Gout: On  Allopurinol at home  Depression/anxiety: Continue home medications  Chronic alcohol use/tobacco use: Counseled cessation.  Also smokes.  Continue nicotine patch, started on CIWA protocol.  Continue thiamine folic acid  BPH:On  Proscar  Hypertension: Monitor blood pressure.  Continue amlodipine, metoprolol  Diastolic CHF: Last echo on 08/09/2020 showed EF of 60 to 81%, grade 1 diastolic dysfunction.  Currently not in exacerbation  Obesity: BMI of 31.5        DVT prophylaxis:Lovenox     Code Status: Full Code  Family Communication: None at bedside  Patient status:Inpatient  Patient is from :Home  Anticipated discharge OF:BPZW  Estimated DC date:1-2 days   Consultants: None  Procedures:None  Antimicrobials:  Anti-infectives (From admission, onward)    Start     Dose/Rate Route Frequency Ordered Stop   11/02/22 1800  tenofovir (VIREAD) tablet 300 mg        300 mg Oral Every evening 11/02/22 0452         Subjective: Patient seen and examined at bedside this morning.  Hemodynamically stable.  This morning he was on 3 L of oxygen but he took the oxygen off by himself.  He was not in respitory distress.  Auscultation revealed severe bilateral expiratory wheezing.  Objective: Vitals:   11/03/22 0415 11/03/22 0500 11/03/22 0700 11/03/22 0716  BP: (!) 140/94 (!) 146/106 (!) 141/69   Pulse: 82 85 89   Resp: 17  (!) 22   Temp:    98.2 F (36.8 C)  TempSrc:  SpO2: 98% 98% 92%   Weight:      Height:        Intake/Output Summary (Last 24 hours) at 11/03/2022 0850 Last data filed at 11/03/2022 0423 Gross per 24 hour  Intake 2000 ml  Output 3300 ml  Net -1300 ml   Filed Weights   11/01/22 2111  Weight: 111.6 kg    Examination:  General exam: Overall comfortable, not in distress,obese HEENT: PERRL Respiratory system: Bilateral expiratory wheezing Cardiovascular system: S1 & S2 heard, RRR.  Gastrointestinal system: Abdomen is nondistended, soft and  nontender. Central nervous system: Alert and oriented Extremities: No edema, no clubbing ,no cyanosis Skin: No rashes, no ulcers,no icterus     Data Reviewed: I have personally reviewed following labs and imaging studies  CBC: Recent Labs  Lab 11/01/22 2116 11/02/22 1101  WBC 8.4 4.5  HGB 15.6 15.1  HCT 43.2 43.3  MCV 91.3 91.9  PLT 143* 790*   Basic Metabolic Panel: Recent Labs  Lab 11/01/22 2116 11/02/22 1101 11/02/22 1906 11/03/22 0041  NA 127* 130* 132* 135  K 3.8 3.9 3.8 4.0  CL 97* 99 100 102  CO2 18* 20* 26 27  GLUCOSE 104* 250* 227* 155*  BUN '12 15 16 18  '$ CREATININE 1.14 1.11 1.11 0.90  CALCIUM 8.5* 8.2* 8.3* 7.9*  MG  --  2.1  --   --   PHOS  --  4.2  --   --      Recent Results (from the past 240 hour(s))  Resp panel by RT-PCR (RSV, Flu A&B, Covid) Anterior Nasal Swab     Status: Abnormal   Collection Time: 11/01/22  9:16 PM   Specimen: Anterior Nasal Swab  Result Value Ref Range Status   SARS Coronavirus 2 by RT PCR NEGATIVE NEGATIVE Final    Comment: (NOTE) SARS-CoV-2 target nucleic acids are NOT DETECTED.  The SARS-CoV-2 RNA is generally detectable in upper respiratory specimens during the acute phase of infection. The lowest concentration of SARS-CoV-2 viral copies this assay can detect is 138 copies/mL. A negative result does not preclude SARS-Cov-2 infection and should not be used as the sole basis for treatment or other patient management decisions. A negative result may occur with  improper specimen collection/handling, submission of specimen other than nasopharyngeal swab, presence of viral mutation(s) within the areas targeted by this assay, and inadequate number of viral copies(<138 copies/mL). A negative result must be combined with clinical observations, patient history, and epidemiological information. The expected result is Negative.  Fact Sheet for Patients:  EntrepreneurPulse.com.au  Fact Sheet for Healthcare  Providers:  IncredibleEmployment.be  This test is no t yet approved or cleared by the [place] FDA and  has been authorized for detection and/or diagnosis of SARS-CoV-2 by FDA under an Emergency Use Authorization (EUA). This EUA will remain  in effect (meaning this test can be used) for the duration of the COVID-19 declaration under Section 564(b)(1) of the Act, 21 U.S.C.section 360bbb-3(b)(1), unless the authorization is terminated  or revoked sooner.       Influenza A by PCR NEGATIVE NEGATIVE Final   Influenza B by PCR NEGATIVE NEGATIVE Final    Comment: (NOTE) The Xpert Xpress SARS-CoV-2/FLU/RSV plus assay is intended as an aid in the diagnosis of influenza from Nasopharyngeal swab specimens and should not be used as a sole basis for treatment. Nasal washings and aspirates are unacceptable for Xpert Xpress SARS-CoV-2/FLU/RSV testing.  Fact Sheet for Patients: EntrepreneurPulse.com.au  Fact Sheet for Healthcare  Providers: IncredibleEmployment.be  This test is not yet approved or cleared by the [place] and has been authorized for detection and/or diagnosis of SARS-CoV-2 by FDA under an Emergency Use Authorization (EUA). This EUA will remain in effect (meaning this test can be used) for the duration of the COVID-19 declaration under Section 564(b)(1) of the Act, 21 U.S.C. section 360bbb-3(b)(1), unless the authorization is terminated or revoked.     Resp Syncytial Virus by PCR POSITIVE (A) NEGATIVE Final    Comment: (NOTE) Fact Sheet for Patients: EntrepreneurPulse.com.au  Fact Sheet for Healthcare Providers: IncredibleEmployment.be  This test is not yet approved or cleared by the [place] FDA and has been authorized for detection and/or diagnosis of SARS-CoV-2 by FDA under an Emergency Use Authorization (EUA). This EUA will remain in effect (meaning this test  can be used) for the duration of the COVID-19 declaration under Section 564(b)(1) of the Act, 21 U.S.C. section 360bbb-3(b)(1), unless the authorization is terminated or revoked.  Performed at [facility], [address] [place], [place] [address]   Group A Strep by PCR     Status: None   Collection Time: 11/01/22  9:16 PM   Specimen: Anterior Nasal Swab; Sterile Swab  Result Value Ref Range Status   Group A Strep by PCR NOT DETECTED NOT DETECTED Final    Comment: Performed at [facility], [address] [place],  [address]     Radiology Studies: CT HEAD WO CONTRAST (5MM)  Result Date: 11/02/2022 CLINICAL DATA:  Provided history: Syncope/presyncope, cerebrovascular cause suspected. Additional history provided: Shortness of breath, sore throat, cough, nasal congestion, body aches, possible RSV. EXAM: CT HEAD WITHOUT CONTRAST TECHNIQUE: Contiguous axial images were obtained from the base of the skull through the vertex without intravenous contrast. RADIATION DOSE REDUCTION: This exam was performed according to the departmental dose-optimization program which includes automated exposure control, adjustment of the mA and/or kV according to patient size and/or use of iterative reconstruction technique. COMPARISON:  CT 11/21/2019. FINDINGS: Brain: Moderate generalized cerebral atrophy. Mild-to-moderate patchy and ill-defined hypoattenuation within the cerebral white matter, nonspecific but compatible with chronic small vessel ischemic disease. There is no acute intracranial hemorrhage. No demarcated cortical infarct. No extra-axial fluid collection. No evidence of an intracranial mass. No midline shift. Vascular: No hyperdense vessel. Skull: Chronic fracture deformity of the left frontal calvarium (with involvement of the outer table of the left frontal sinus). No acute calvarial fracture or aggressive osseous lesion. Sinuses/Orbits: No mass or acute finding within the  imaged orbits. Chronic medially displaced fracture deformity of the left lamina papyracea. Mild-to-moderate mucosal thickening within the bilateral maxillary sinuses. Mild mucosal thickening within the right sphenoid sinus. Mild-to-moderate mucosal thickening within the left sphenoid sinus. Mild mucosal thickening within the bilateral ethmoid air cells. IMPRESSION: No evidence of acute intracranial abnormality. Mild-to-moderate chronic small vessel ischemic changes within the cerebral white matter. Chronic fracture deformities of the left frontal calvarium and medial left orbit. Paranasal sinus disease, as described. Electronically Signed   By: Kellie Simmering D.O.   On: 11/02/2022 10:07   DG Chest 2 View  Result Date: 11/01/2022 CLINICAL DATA:  Shortness of breath. EXAM: CHEST - 2 VIEW COMPARISON:  10/14/2022 and prior studies FINDINGS: A RIGHT IJ Port-A-Cath is again noted with tip overlying the UPPER SVC. The cardiomediastinal silhouette is unremarkable. Mild interstitial prominence is unchanged. Nodules identified on prior CTs are difficult to definitely visualize radiographically. There is no evidence of focal airspace disease, pulmonary edema,  pleural effusion, or pneumothorax. No acute bony abnormalities are identified. IMPRESSION: 1. No evidence of acute cardiopulmonary disease. 2. Unchanged mild interstitial prominence. No new pulmonary nodules are difficult to definitely identify radiographically. Electronically Signed   By: Margarette Canada M.D.   On: 11/01/2022 21:35    Scheduled Meds:  allopurinol  300 mg Oral QPM   amLODipine  5 mg Oral QPM   atorvastatin  80 mg Oral QPM   buPROPion  300 mg Oral QPM   chlorpheniramine-HYDROcodone  5 mL Oral Q12H   clonazePAM  0.5 mg Oral QPM   DULoxetine  60 mg Oral QPM   enoxaparin (LOVENOX) injection  0.5 mg/kg Subcutaneous Q24H   finasteride  5 mg Oral QPM   folic acid  1 mg Oral Daily   ipratropium-albuterol  3 mL Nebulization Q4H   loratadine  10 mg Oral  QPM   LORazepam  0-4 mg Intravenous Q6H   Followed by   Derrill Memo ON 11/04/2022] LORazepam  0-4 mg Intravenous Q12H   methylPREDNISolone (SOLU-MEDROL) injection  40 mg Intravenous Q12H   metoprolol succinate  100 mg Oral QPM   mirtazapine  15 mg Oral QHS   multivitamin with minerals  1 tablet Oral Daily   nicotine  21 mg Transdermal Daily   OLANZapine  5 mg Oral QHS   tenofovir  300 mg Oral QPM   thiamine  100 mg Oral Daily   Or   thiamine  100 mg Intravenous Daily   Continuous Infusions:  sodium chloride 100 mL/hr at 11/03/22 0416     LOS: 1 day   Shelly Coss, MD [facility] P12/03/2022, 8:50 AM

## 2022-11-03 NOTE — Plan of Care (Signed)
  Problem: Activity: Goal: Ability to tolerate increased activity will improve Outcome: Progressing   Problem: Respiratory: Goal: Ability to maintain a clear airway will improve Outcome: Progressing   Problem: Activity: Goal: Risk for activity intolerance will decrease Outcome: Progressing   Problem: Pain Managment: Goal: General experience of comfort will improve Outcome: Progressing   Problem: Safety: Goal: Ability to remain free from injury will improve Outcome: Progressing

## 2022-11-04 LAB — CBC
HCT: 42.8 % (ref 39.0–52.0)
Hemoglobin: 14.5 g/dL (ref 13.0–17.0)
MCH: 31.9 pg (ref 26.0–34.0)
MCHC: 33.9 g/dL (ref 30.0–36.0)
MCV: 94.1 fL (ref 80.0–100.0)
Platelets: 138 10*3/uL — ABNORMAL LOW (ref 150–400)
RBC: 4.55 MIL/uL (ref 4.22–5.81)
RDW: 12.6 % (ref 11.5–15.5)
WBC: 8.4 10*3/uL (ref 4.0–10.5)
nRBC: 0 % (ref 0.0–0.2)

## 2022-11-04 LAB — COMPREHENSIVE METABOLIC PANEL
ALT: 56 U/L — ABNORMAL HIGH (ref 0–44)
AST: 69 U/L — ABNORMAL HIGH (ref 15–41)
Albumin: 3.7 g/dL (ref 3.5–5.0)
Alkaline Phosphatase: 131 U/L — ABNORMAL HIGH (ref 38–126)
Anion gap: 7 (ref 5–15)
BUN: 18 mg/dL (ref 6–20)
CO2: 26 mmol/L (ref 22–32)
Calcium: 8 mg/dL — ABNORMAL LOW (ref 8.9–10.3)
Chloride: 104 mmol/L (ref 98–111)
Creatinine, Ser: 0.84 mg/dL (ref 0.61–1.24)
GFR, Estimated: >60 mL/min (ref >60)
Glucose, Bld: 106 mg/dL — ABNORMAL HIGH (ref 70–99)
Potassium: 4 mmol/L (ref 3.5–5.1)
Sodium: 137 mmol/L (ref 135–145)
Total Bilirubin: 0.5 mg/dL (ref 0.3–1.2)
Total Protein: 5.8 g/dL — ABNORMAL LOW (ref 6.5–8.1)

## 2022-11-04 LAB — HEMOGLOBIN A1C
Hgb A1c MFr Bld: 6.2 % — ABNORMAL HIGH (ref 4.8–5.6)
Mean Plasma Glucose: 131 mg/dL

## 2022-11-04 LAB — GLUCOSE, CAPILLARY
Glucose-Capillary: 120 mg/dL — ABNORMAL HIGH (ref 70–99)
Glucose-Capillary: 128 mg/dL — ABNORMAL HIGH (ref 70–99)

## 2022-11-04 MED ORDER — PREDNISONE 20 MG PO TABS: 40.0000 mg | ORAL_TABLET | Freq: Every day | ORAL | 0 refills | Status: AC

## 2022-11-04 MED ORDER — HYDROCOD POLI-CHLORPHE POLI ER 10-8 MG/5ML PO SUER: 5.0000 mL | Freq: Two times a day (BID) | ORAL | 0 refills | Status: AC | PRN

## 2022-11-04 NOTE — Progress Notes (Signed)
SATURATION QUALIFICATIONS: (This note is used to comply with regulatory documentation for home oxygen)  Patient Saturations on Room Air at Rest = 94 %  Patient Saturations on Room Air while Ambulating = 91 %  Patient Saturations on 2 Liters of oxygen while Ambulating = 95%  Please briefly explain why patient needs home oxygen: Patient oxygen on RA while   ambulating desaturated to 91%, and when administered Oxygen at 2L/m increased to   95%.

## 2022-11-04 NOTE — Discharge Summary (Signed)
Physician Discharge Summary   Patient: Russell Simpson MRN: [id number] DOB: 12-22-1960  Admit date:     11/02/2022  Discharge date: 11/04/2022  Discharge Physician: Cordelia Poche, MD   PCP: Jon Billings, NP   Recommendations at discharge:  PCP follow-up  Discharge Diagnoses: Principal Problem:   Acute respiratory failure with hypoxia [facility]) Active Problems:   COPD exacerbation ([place])   Severe sepsis ([place])   RSV infection   Hyponatremia   Abnormal LFTs   HLD (hyperlipidemia)   Syncope   Gout   Depression with anxiety   Alcohol use disorder, severe, dependence ([place])   Tobacco abuse   BPH (benign prostatic hyperplasia)   Chronic diastolic CHF (congestive heart failure) (HCC)   Obesity with body mass index (BMI) of 30.0 to 39.9   HTN (hypertension)  Resolved Problems:   * No resolved hospital problems. [facility] Course: No notes on file  Assessment and Plan:  Acute respiratory failure with hypoxia Associated dyspnea. Secondary to COPD exacerbation. Patient required 3 L/min on admission and was successfully weaned to room air prior to discharge.  COPD exacerbation Secondary to RSV infection. Patient managed on Solumedrol IV which was transitioned to prednisone. Patient also managed with Duonebs. Overall, symptoms and functional capacity significantly improved prior to discharge. Patient discharged with recommendations to continue prednisone. Resume home COPD regimen.  RSV infection Noted on testing. Supportive care.  Hyperlipidemia Continue Lipitor  Syncope Presumed secondary to dehydration and possible vasovagal episode. CT head negative.  Gout Continue allopurinol.  Depression Anxiety Zyprexa, Cymbalta and Klonopin  Chronic alcohol use Patient placed on CIWA protocol while inpatient.  Tobacco use Counseled this admission.  BPH Continue finasteride.  Primary hypertension Continue amlodipine and metoprolol  Diastolic heart failure Stable.    Severe sepsis Present on admission and secondary to RSV infection.  Obesity Estimated body mass index is 31.58 kg/m as calculated from the following:   Height as of this encounter: '6\' 2"'$  (1.88 m).   Weight as of this encounter: 111.6 kg.  Consultants: None Procedures performed: None  Disposition: Home Diet recommendation: Regular diet  DISCHARGE MEDICATION: Allergies as of 11/04/2022       Reactions   Penicillins Anaphylaxis   Tolerates cefdinir, cephalexin and amoxicillin/clavulonate so true PCN allergy unlikely   Tetanus Toxoids Swelling   Lisinopril Cough   Losartan    Other reaction(s): Muscle Pain   Codeine Nausea Only, Nausea And Vomiting   Doxycycline Rash   Ruxience [rituximab-pvvr] Rash   05/06/21 pt w/ worsening rash during Ruxience infusion.  Face, neck and chest flushing redness        Medication List     STOP taking these medications    cefdinir 300 MG capsule Commonly known as: OMNICEF   clindamycin 300 MG capsule Commonly known as: Cleocin   diazepam 5 MG tablet Commonly known as: VALIUM       TAKE these medications    albuterol 108 (90 Base) MCG/ACT inhaler Commonly known as: VENTOLIN HFA Inhale 2 puffs into the lungs every 6 (six) hours as needed for wheezing or shortness of breath.   allopurinol 300 MG tablet Commonly known as: ZYLOPRIM TAKE 1 TABLET BY MOUTH ONCE DAILY What changed: when to take this   amLODipine 5 MG tablet Commonly known as: NORVASC Take 1 tablet (5 mg total) by mouth daily. What changed: when to take this   atorvastatin 80 MG tablet Commonly known as: LIPITOR Take 1 tablet (80 mg total) by mouth daily. What  changed: when to take this   benzonatate 200 MG capsule Commonly known as: TESSALON Take 1 capsule (200 mg total) by mouth 2 (two) times daily as needed for cough.   buPROPion 300 MG 24 hr tablet Commonly known as: WELLBUTRIN XL Take 1 tablet (300 mg total) by mouth daily. What changed: when to  take this   calcipotriene 0.005 % cream Commonly known as: DOVONOX Apply topically 2 (two) times daily. Apply twice a day after fluorouracil to affected areas as directed in handout. What changed:  how much to take when to take this   chlorpheniramine-HYDROcodone 10-8 MG/5ML Commonly known as: TUSSIONEX Take 5 mLs by mouth every 12 (twelve) hours as needed for up to 5 days for cough.   clonazePAM 0.5 MG tablet Commonly known as: KLONOPIN Take 0.5 mg by mouth every evening.   DULoxetine 60 MG capsule Commonly known as: CYMBALTA Take 60 mg by mouth every evening.   finasteride 5 MG tablet Commonly known as: PROSCAR Take 1 tablet (5 mg total) by mouth daily. What changed: when to take this   fluorouracil 5 % cream Commonly known as: EFUDEX Apply topically 2 (two) times daily. Apply twice a day to affected areas as directed in handout. What changed:  how much to take when to take this   loratadine 10 MG tablet Commonly known as: CLARITIN Take 10 mg by mouth every evening.   metoprolol succinate 50 MG 24 hr tablet Commonly known as: TOPROL-XL Take 2 tablets (100 mg total) by mouth daily. Take with or immediately following a meal. What changed: when to take this   mirtazapine 15 MG tablet Commonly known as: REMERON Take 15 mg by mouth at bedtime.   mupirocin ointment 2 % Commonly known as: BACTROBAN Apply 1 Application topically daily. Apply to any open wounds until healed. What changed:  when to take this reasons to take this   OLANZapine 5 MG tablet Commonly known as: ZYPREXA Take 5 mg by mouth at bedtime.   predniSONE 20 MG tablet Commonly known as: DELTASONE Take 2 tablets (40 mg total) by mouth daily for 3 days. What changed:  how much to take how to take this when to take this additional instructions   sildenafil 20 MG tablet Commonly known as: REVATIO TAKE 3-5 TABLETS BY MOUTH ONCE DAILY AS NEEDED FOR ERECTILE DYSFUNCTION What changed: See the new  instructions.   tenofovir 300 MG tablet Commonly known as: VIREAD Take 300 mg by mouth every evening.   testosterone cypionate 200 MG/ML injection Commonly known as: DEPOTESTOSTERONE CYPIONATE Inject 1 mL (200 mg total) into the muscle every 14 (fourteen) days.   traMADol 50 MG tablet Commonly known as: ULTRAM Take 1 tablet by mouth every 6 (six) hours as needed.   Trelegy Ellipta 100-62.5-25 MCG/ACT Aepb Generic drug: Fluticasone-Umeclidin-Vilant Inhale 1 puff into the lungs daily.        Follow-up Information     Jon Billings, NP. Schedule an appointment as soon as possible for a visit in 1 week(s).   Specialty: Nurse Practitioner Why: For hospital follow-up Contact information: [place] [address] [phone]                Discharge Exam: BP (!) 141/94 (BP Location: Left Arm)   Pulse 81   Temp (!) 97.5 F (36.4 C)   Resp 16   Ht '6\' 2"'$  (1.88 m)   Wt 111.6 kg   SpO2 92%   BMI 31.58 kg/m  General exam: Appears calm and comfortable Respiratory system: Diminished. Respiratory effort normal. Cardiovascular system: S1 & S2 heard, RRR. Gastrointestinal system: Abdomen is nondistended, soft and nontender. Central nervous system: Alert and oriented. No focal neurological deficits. Musculoskeletal: No calf tenderness Psychiatry: Judgement and insight appear normal. Mood & affect appropriate.   Condition at discharge: stable  The results of significant diagnostics from this hospitalization (including imaging, microbiology, ancillary and laboratory) are listed below for reference.   Imaging Studies: CT HEAD WO CONTRAST (5MM)  Result Date: 11/02/2022 CLINICAL DATA:  Provided history: Syncope/presyncope, cerebrovascular cause suspected. Additional history provided: Shortness of breath, sore throat, cough, nasal congestion, body aches, possible RSV. EXAM: CT HEAD WITHOUT CONTRAST TECHNIQUE: Contiguous axial images were obtained from the  base of the skull through the vertex without intravenous contrast. RADIATION DOSE REDUCTION: This exam was performed according to the departmental dose-optimization program which includes automated exposure control, adjustment of the mA and/or kV according to patient size and/or use of iterative reconstruction technique. COMPARISON:  CT 11/21/2019. FINDINGS: Brain: Moderate generalized cerebral atrophy. Mild-to-moderate patchy and ill-defined hypoattenuation within the cerebral white matter, nonspecific but compatible with chronic small vessel ischemic disease. There is no acute intracranial hemorrhage. No demarcated cortical infarct. No extra-axial fluid collection. No evidence of an intracranial mass. No midline shift. Vascular: No hyperdense vessel. Skull: Chronic fracture deformity of the left frontal calvarium (with involvement of the outer table of the left frontal sinus). No acute calvarial fracture or aggressive osseous lesion. Sinuses/Orbits: No mass or acute finding within the imaged orbits. Chronic medially displaced fracture deformity of the left lamina papyracea. Mild-to-moderate mucosal thickening within the bilateral maxillary sinuses. Mild mucosal thickening within the right sphenoid sinus. Mild-to-moderate mucosal thickening within the left sphenoid sinus. Mild mucosal thickening within the bilateral ethmoid air cells. IMPRESSION: No evidence of acute intracranial abnormality. Mild-to-moderate chronic small vessel ischemic changes within the cerebral white matter. Chronic fracture deformities of the left frontal calvarium and medial left orbit. Paranasal sinus disease, as described. Electronically Signed   By: Kellie Simmering D.O.   On: 11/02/2022 10:07   DG Chest 2 View  Result Date: 11/01/2022 CLINICAL DATA:  Shortness of breath. EXAM: CHEST - 2 VIEW COMPARISON:  10/14/2022 and prior studies FINDINGS: A RIGHT IJ Port-A-Cath is again noted with tip overlying the UPPER SVC. The cardiomediastinal  silhouette is unremarkable. Mild interstitial prominence is unchanged. Nodules identified on prior CTs are difficult to definitely visualize radiographically. There is no evidence of focal airspace disease, pulmonary edema, pleural effusion, or pneumothorax. No acute bony abnormalities are identified. IMPRESSION: 1. No evidence of acute cardiopulmonary disease. 2. Unchanged mild interstitial prominence. No new pulmonary nodules are difficult to definitely identify radiographically. Electronically Signed   By: Margarette Canada M.D.   On: 11/01/2022 21:35   DG Chest Port 1 View  Result Date: 10/14/2022 CLINICAL DATA:  Status post bronchoscopy with biopsy. EXAM: PORTABLE CHEST 1 VIEW COMPARISON:  Chest radiographs 12/12/2021.  Chest CT 09/29/2022. FINDINGS: A right jugular Port-A-Cath remains in place, terminating over the upper SVC. The cardiomediastinal silhouette is within normal limits. Known bilateral lung nodules are not well seen radiographically. No acute airspace consolidation, sizeable pleural effusion, or pneumothorax is identified, with note made of incomplete imaging of the right lateral costophrenic angle. No acute osseous abnormality is seen. IMPRESSION: No evidence of pneumothorax or acute cardiopulmonary disease. Electronically Signed   By: Logan Bores M.D.   On: 10/14/2022 15:18   DG C-Arm 1-60 Min-No Report  Result Date: 10/14/2022 Fluoroscopy was utilized by the requesting physician.  No radiographic interpretation.    Microbiology: Results for orders placed or performed during the hospital encounter of 11/02/22  Resp panel by RT-PCR (RSV, Flu A&B, Covid) Anterior Nasal Swab     Status: Abnormal   Collection Time: 11/01/22  9:16 PM   Specimen: Anterior Nasal Swab  Result Value Ref Range Status   SARS Coronavirus 2 by RT PCR NEGATIVE NEGATIVE Final    Comment: (NOTE) SARS-CoV-2 target nucleic acids are NOT DETECTED.  The SARS-CoV-2 RNA is generally detectable in upper  respiratory specimens during the acute phase of infection. The lowest concentration of SARS-CoV-2 viral copies this assay can detect is 138 copies/mL. A negative result does not preclude SARS-Cov-2 infection and should not be used as the sole basis for treatment or other patient management decisions. A negative result may occur with  improper specimen collection/handling, submission of specimen other than nasopharyngeal swab, presence of viral mutation(s) within the areas targeted by this assay, and inadequate number of viral copies(<138 copies/mL). A negative result must be combined with clinical observations, patient history, and epidemiological information. The expected result is Negative.  Fact Sheet for Patients:  EntrepreneurPulse.com.au  Fact Sheet for Healthcare Providers:  IncredibleEmployment.be  This test is no t yet approved or cleared by the [place] FDA and  has been authorized for detection and/or diagnosis of SARS-CoV-2 by FDA under an Emergency Use Authorization (EUA). This EUA will remain  in effect (meaning this test can be used) for the duration of the COVID-19 declaration under Section 564(b)(1) of the Act, 21 U.S.C.section 360bbb-3(b)(1), unless the authorization is terminated  or revoked sooner.       Influenza A by PCR NEGATIVE NEGATIVE Final   Influenza B by PCR NEGATIVE NEGATIVE Final    Comment: (NOTE) The Xpert Xpress SARS-CoV-2/FLU/RSV plus assay is intended as an aid in the diagnosis of influenza from Nasopharyngeal swab specimens and should not be used as a sole basis for treatment. Nasal washings and aspirates are unacceptable for Xpert Xpress SARS-CoV-2/FLU/RSV testing.  Fact Sheet for Patients: EntrepreneurPulse.com.au  Fact Sheet for Healthcare Providers: IncredibleEmployment.be  This test is not yet approved or cleared by the [place] FDA and has been  authorized for detection and/or diagnosis of SARS-CoV-2 by FDA under an Emergency Use Authorization (EUA). This EUA will remain in effect (meaning this test can be used) for the duration of the COVID-19 declaration under Section 564(b)(1) of the Act, 21 U.S.C. section 360bbb-3(b)(1), unless the authorization is terminated or revoked.     Resp Syncytial Virus by PCR POSITIVE (A) NEGATIVE Final    Comment: (NOTE) Fact Sheet for Patients: EntrepreneurPulse.com.au  Fact Sheet for Healthcare Providers: IncredibleEmployment.be  This test is not yet approved or cleared by the [place] FDA and has been authorized for detection and/or diagnosis of SARS-CoV-2 by FDA under an Emergency Use Authorization (EUA). This EUA will remain in effect (meaning this test can be used) for the duration of the COVID-19 declaration under Section 564(b)(1) of the Act, 21 U.S.C. section 360bbb-3(b)(1), unless the authorization is terminated or revoked.  Performed at [facility], [address] [place], [place] [address]   Group A Strep by PCR     Status: None   Collection Time: 11/01/22  9:16 PM   Specimen: Anterior Nasal Swab; Sterile Swab  Result Value Ref Range Status   Group A Strep by PCR NOT DETECTED NOT DETECTED Final  Comment: Performed at [facility], [address] [place], [place] [address]    Labs: CBC: Recent Labs  Lab 11/01/22 2116 11/02/22 1101 11/04/22 0614  WBC 8.4 4.5 8.4  HGB 15.6 15.1 14.5  HCT 43.2 43.3 42.8  MCV 91.3 91.9 94.1  PLT 143* 131* 833*   Basic Metabolic Panel: Recent Labs  Lab 11/02/22 1101 11/02/22 1906 11/03/22 0041 11/03/22 1021 11/04/22 0614  NA 130* 132* 135 136 137  K 3.9 3.8 4.0 4.1 4.0  CL 99 100 102 102 104  CO2 20* '26 27 24 26  '$ GLUCOSE 250* 227* 155* 257* 106*  BUN '15 16 18 18 18  '$ CREATININE 1.11 1.11 0.90 0.99 0.84  CALCIUM 8.2* 8.3* 7.9* 8.2* 8.0*  MG 2.1  --   --   --    --   PHOS 4.2  --   --   --   --    Liver Function Tests: Recent Labs  Lab 11/01/22 2116 11/04/22 0614  AST 69* 69*  ALT 56* 56*  ALKPHOS 163* 131*  BILITOT 0.9 0.5  PROT 6.9 5.8*  ALBUMIN 4.3 3.7   CBG: Recent Labs  Lab 11/03/22 1751 11/03/22 2103 11/04/22 0805 11/04/22 1217  GLUCAP 91 96 120* 128*    Discharge time spent: 35 minutes.  Signed: Cordelia Poche, MD [facility] 11/04/2022

## 2022-11-04 NOTE — Plan of Care (Signed)
Patient discharged per MD orders at this time.All discharge instructions,education and medications reviewed with the patient.Pt expressed understanding and will comply with dc instructions.f/u appointments was also communicated to the patient.no verbal c/o or any ssx of distress.Pt was discharged home with self-care per order.Pt was transported home by girlfriend in a privately owned vehicle.

## 2022-11-04 NOTE — Discharge Instructions (Signed)
Russell Simpson,  You were in the hospital because of a COPD exacerbation which was due to an infection with RSV. You have improved. Please continue taking the steroids as prescribed and your cough medication. Please follow-up with your primary care physician.

## 2022-11-04 NOTE — TOC Initial Note (Signed)
Transition of Care [facility]) - Initial/Assessment Note    Patient Details  Name: Russell Simpson MRN: [id number] Date of Birth: August 24, 1961  Transition of Care [facility]) CM/SW Contact:    Conception Oms, RN Phone Number: 11/04/2022, 8:54 AM  Clinical Narrative:      Transition of Care [facility]) Screening Note   Patient Details  Name: Russell Simpson Date of Birth: Oct 17, 1961   Transition of Care (TOC) CM/SW Contact:    Conception Oms, RN Phone Number: 11/04/2022, 8:54 AM  oxygen saturation 89% on room air, which improved to 99% on 3 L oxygen   89% will not qualify for home oxygen  PCP [place] drug  Transition of Care Department [facility]) has reviewed patient and no TOC needs have been identified at this time. We will continue to monitor patient advancement through interdisciplinary progression rounds. If new patient transition needs arise, please place a TOC consult.                Expected Discharge Plan: Home/Self Care Barriers to Discharge: No Barriers Identified   Patient Goals and CMS Choice        Expected Discharge Plan and Services Expected Discharge Plan: Home/Self Care                                              Prior Living Arrangements/Services                       Activities of Daily Living Home Assistive Devices/Equipment: Cane (specify quad or straight) ADL Screening (condition at time of admission) Patient's cognitive ability adequate to safely complete daily activities?: Yes Is the patient deaf or have difficulty hearing?: No Does the patient have difficulty seeing, even when wearing glasses/contacts?: No Does the patient have difficulty concentrating, remembering, or making decisions?: No Patient able to express need for assistance with ADLs?: Yes Does the patient have difficulty dressing or bathing?: No Independently performs ADLs?: Yes (appropriate for developmental age) Does the patient have difficulty  walking or climbing stairs?: No Weakness of Legs: None Weakness of Arms/Hands: None  Permission Sought/Granted                  Emotional Assessment              Admission diagnosis:  Cough syncope syndrome [R55, R05.4] RSV (respiratory syncytial virus infection) [B33.8] COPD exacerbation ([place]) [J44.1] Acute respiratory failure with hypoxia ([place]) [J96.01] Patient Active Problem List   Diagnosis Date Noted   Acute respiratory failure with hypoxia ([place]) 11/02/2022   COPD exacerbation ([place]) 11/02/2022   RSV infection 11/02/2022   Severe sepsis ([place]) 11/02/2022   HLD (hyperlipidemia) 11/02/2022   Depression with anxiety 11/02/2022   BPH (benign prostatic hyperplasia) 11/02/2022   Chronic diastolic CHF (congestive heart failure) ([place]) 11/02/2022   Hyponatremia 11/02/2022   Abnormal LFTs 11/02/2022   Obesity with body mass index (BMI) of 30.0 to 39.9 11/02/2022   Syncope 11/02/2022   HTN (hypertension) 11/02/2022   Multiple lung nodules on CT 10/14/2022   Hilar adenopathy 10/14/2022   Closed fracture of metatarsal bone 09/01/2022   Sprain of ligament of tarsometatarsal joint 09/01/2022   Closed fracture of lateral malleolus 09/01/2022   Claustrophobia 08/05/2022   Closed fracture of distal fibula 05/28/2022   Lymphoma ([place]) 04/07/2022   Non-Hodgkin's lymphoma ([place]) 01/05/2022  Depression, recurrent ([place]) 01/05/2022   Tobacco abuse 01/05/2022   Gout 01/05/2022   Subacute cough 11/10/2021   Sinus congestion 10/30/2021   Hyperglycemia 08/26/2021   Squamous cell carcinoma of neck    Chemotherapy induced neutropenia (HCC)    Stage 2 moderate COPD by GOLD classification ([place]) 05/14/2021   Cellulitis 05/14/2021   Cellulitis, neck 05/14/2021   COVID-19 virus infection 05/14/2021   Alcohol use disorder, severe, dependence ([place]) 97/41/6384   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/03/2021   Aortic atherosclerosis ([place]) 02/27/2021   High risk  medication use 03/21/2020   Noncompliance with treatment regimen 02/19/2020   Erectile dysfunction due to arterial insufficiency 08/10/2019   Mixed bipolar I disorder ([place]) 07/19/2019   PTSD (post-traumatic stress disorder) 07/19/2019   Panic disorder 07/19/2019   Tobacco use disorder 07/19/2019   Moderate episode of recurrent major depressive disorder ([place]) 05/25/2019   S/P lumbar fusion 02/22/2019   Alcohol use disorder, severe, in early remission ([place]) 01/01/2019   Disorder of bursae of shoulder region 07/21/2017   Full thickness rotator cuff tear 07/21/2017   Localized, primary osteoarthritis 07/21/2017   Osteoarthritis of elbow 07/21/2017   Osteoarthritis of knee 07/21/2017   Anxiety 01/10/2016   BP (high blood pressure) 01/10/2016   Apnea, sleep 01/10/2016   Hyperlipidemia 10/15/2015   Pituitary adenoma ([place]) 10/15/2015   Hypogonadism in male 10/15/2015   Depression 10/15/2015   Impotence of organic origin 10/15/2015   Allergic rhinitis 10/15/2015   Incomplete bladder emptying 05/30/2015   Hernia, inguinal, left 09/21/2014   Benign prostatic hyperplasia with lower urinary tract symptoms 10/24/2012   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 [place] [address] Phone: [phone] Fax: [phone]     Social Determinants of Health (SDOH) Interventions    Readmission Risk Interventions     No data to display

## 2022-11-04 NOTE — Plan of Care (Signed)
  Problem: Activity: Goal: Ability to tolerate increased activity will improve Outcome: Progressing   Problem: Respiratory: Goal: Ability to maintain a clear airway will improve Outcome: Progressing   Problem: Education: Goal: Knowledge of disease or condition will improve Outcome: Progressing   Problem: Health Behavior/Discharge Planning: Goal: Ability to manage health-related needs will improve Outcome: Progressing   Problem: Clinical Measurements: Goal: Will remain free from infection Outcome: Progressing   Problem: Nutrition: Goal: Adequate nutrition will be maintained Outcome: Progressing   Problem: Coping: Goal: Level of anxiety will decrease Outcome: Progressing   Problem: Pain Managment: Goal: General experience of comfort will improve Outcome: Progressing   Problem: Safety: Goal: Ability to remain free from injury will improve Outcome: Progressing   Problem: Skin Integrity: Goal: Risk for impaired skin integrity will decrease Outcome: Progressing   Problem: Metabolic: Goal: Ability to maintain appropriate glucose levels will improve Outcome: Progressing   Problem: Nutritional: Goal: Maintenance of adequate nutrition will improve Outcome: Progressing

## 2022-11-05 ENCOUNTER — Telehealth: Payer: Self-pay | Admitting: *Deleted

## 2022-11-05 NOTE — Patient Outreach (Signed)
  Care Coordination [facility] Note Transition Care Management Unsuccessful Follow-up Telephone Call  Date of discharge and from where:  [facility] [phone]  Attempts:  1st Attempt  Reason for unsuccessful TCM follow-up call:  Left voice message  [place] Care Management [phone]

## 2022-11-06 ENCOUNTER — Other Ambulatory Visit: Payer: Medicare HMO

## 2022-11-06 ENCOUNTER — Telehealth: Payer: Self-pay | Admitting: *Deleted

## 2022-11-06 NOTE — Patient Outreach (Signed)
  Care Coordination [facility] Note Transition Care Management Follow-up Telephone Call Date of discharge and from where: [facility] [phone] How have you been since you were released from the hospital? Kake has a cough but the Tussionex is helping Any questions or concerns? No  Items Reviewed: Did the pt receive and understand the discharge instructions provided? Yes  Medications obtained and verified? Yes  Other? No  Any new allergies since your discharge? No  Dietary orders reviewed? No Do you have support at home? Yes   Home Care and Equipment/Supplies: Were home health services ordered? no If so, what is the name of the agency? N/a  Has the agency set up a time to come to the patient's home? not applicable Were any new equipment or medical supplies ordered?  No What is the name of the medical supply agency? N  Were you able to get the supplies/equipment? not applicable Do you have any questions related to the use of the equipment or supplies? No  Functional Questionnaire: (I = Independent and D = Dependent) ADLs: I  Bathing/Dressing- I  Meal Prep- I  Eating- I  Maintaining continence- I  Transferring/Ambulation- I  Managing Meds- I  Follow up appointments reviewed:  PCP Hospital f/u appt confirmed? Yes  PCP [id number] 12:00 Specialist Hospital f/u appt confirmed? Yes  Labs [id number] 2:00 , Dr Janese Banks [id number] .2:15.  Pulmonary function test [id number] 4:00 Are transportation arrangements needed? No  If their condition worsens, is the pt aware to call PCP or go to the Emergency Dept.? Yes Was the patient provided with contact information for the PCP's office or ED? Yes Was to pt encouraged to call back with questions or concerns? Yes  SDOH assessments and interventions completed:   Yes SDOH Interventions Today    Flowsheet Row Most Recent Value  SDOH Interventions   Food Insecurity Interventions Intervention Not Indicated  Housing Interventions Intervention Not Indicated   Transportation Interventions Intervention Not Indicated       Care Coordination Interventions:  No Care Coordination interventions needed at this time.   Encounter Outcome:  Pt. Visit Completed    Peter Management [phone]

## 2022-11-09 ENCOUNTER — Inpatient Hospital Stay (HOSPITAL_BASED_OUTPATIENT_CLINIC_OR_DEPARTMENT_OTHER): Payer: Medicare HMO | Admitting: Oncology

## 2022-11-09 ENCOUNTER — Encounter: Payer: Self-pay | Admitting: Oncology

## 2022-11-09 ENCOUNTER — Inpatient Hospital Stay: Payer: Medicare HMO | Attending: Oncology

## 2022-11-09 VITALS — BP 123/86 | HR 104 | Resp 18 | Ht 74.0 in | Wt 238.0 lb

## 2022-11-09 DIAGNOSIS — Z8572 Personal history of non-Hodgkin lymphomas: Secondary | ICD-10-CM | POA: Diagnosis not present

## 2022-11-09 DIAGNOSIS — C8202 Follicular lymphoma grade I, intrathoracic lymph nodes: Secondary | ICD-10-CM

## 2022-11-09 DIAGNOSIS — Z79899 Other long term (current) drug therapy: Secondary | ICD-10-CM | POA: Diagnosis not present

## 2022-11-09 DIAGNOSIS — E291 Testicular hypofunction: Secondary | ICD-10-CM | POA: Insufficient documentation

## 2022-11-09 DIAGNOSIS — I10 Essential (primary) hypertension: Secondary | ICD-10-CM | POA: Insufficient documentation

## 2022-11-09 DIAGNOSIS — E785 Hyperlipidemia, unspecified: Secondary | ICD-10-CM | POA: Diagnosis not present

## 2022-11-09 DIAGNOSIS — J219 Acute bronchiolitis, unspecified: Secondary | ICD-10-CM | POA: Insufficient documentation

## 2022-11-09 DIAGNOSIS — D369 Benign neoplasm, unspecified site: Secondary | ICD-10-CM | POA: Insufficient documentation

## 2022-11-09 DIAGNOSIS — R7989 Other specified abnormal findings of blood chemistry: Secondary | ICD-10-CM | POA: Insufficient documentation

## 2022-11-09 DIAGNOSIS — F17210 Nicotine dependence, cigarettes, uncomplicated: Secondary | ICD-10-CM | POA: Insufficient documentation

## 2022-11-09 DIAGNOSIS — Z08 Encounter for follow-up examination after completed treatment for malignant neoplasm: Secondary | ICD-10-CM

## 2022-11-09 LAB — CBC WITH DIFFERENTIAL/PLATELET
Abs Immature Granulocytes: 0.15 10*3/uL — ABNORMAL HIGH (ref 0.00–0.07)
Basophils Absolute: 0 10*3/uL (ref 0.0–0.1)
Basophils Relative: 0 %
Eosinophils Absolute: 0.1 10*3/uL (ref 0.0–0.5)
Eosinophils Relative: 1 %
HCT: 45.4 % (ref 39.0–52.0)
Hemoglobin: 15.8 g/dL (ref 13.0–17.0)
Immature Granulocytes: 2 %
Lymphocytes Relative: 33 %
Lymphs Abs: 3.2 10*3/uL (ref 0.7–4.0)
MCH: 32.2 pg (ref 26.0–34.0)
MCHC: 34.8 g/dL (ref 30.0–36.0)
MCV: 92.7 fL (ref 80.0–100.0)
Monocytes Absolute: 1.1 10*3/uL — ABNORMAL HIGH (ref 0.1–1.0)
Monocytes Relative: 12 %
Neutro Abs: 5.2 10*3/uL (ref 1.7–7.7)
Neutrophils Relative %: 52 %
Platelets: 184 10*3/uL (ref 150–400)
RBC: 4.9 MIL/uL (ref 4.22–5.81)
RDW: 12.4 % (ref 11.5–15.5)
WBC: 9.7 10*3/uL (ref 4.0–10.5)
nRBC: 0 % (ref 0.0–0.2)

## 2022-11-09 LAB — COMPREHENSIVE METABOLIC PANEL
ALT: 69 U/L — ABNORMAL HIGH (ref 0–44)
AST: 58 U/L — ABNORMAL HIGH (ref 15–41)
Albumin: 3.7 g/dL (ref 3.5–5.0)
Alkaline Phosphatase: 123 U/L (ref 38–126)
Anion gap: 11 (ref 5–15)
BUN: 17 mg/dL (ref 6–20)
CO2: 25 mmol/L (ref 22–32)
Calcium: 8.1 mg/dL — ABNORMAL LOW (ref 8.9–10.3)
Chloride: 101 mmol/L (ref 98–111)
Creatinine, Ser: 1.07 mg/dL (ref 0.61–1.24)
GFR, Estimated: >60 mL/min (ref >60)
Glucose, Bld: 126 mg/dL — ABNORMAL HIGH (ref 70–99)
Potassium: 3.4 mmol/L — ABNORMAL LOW (ref 3.5–5.1)
Sodium: 137 mmol/L (ref 135–145)
Total Bilirubin: 0.8 mg/dL (ref 0.3–1.2)
Total Protein: 5.8 g/dL — ABNORMAL LOW (ref 6.5–8.1)

## 2022-11-09 LAB — LACTATE DEHYDROGENASE: LDH: 141 U/L (ref 98–192)

## 2022-11-09 NOTE — Progress Notes (Signed)
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 [name]  He sustained a left anterior frontal sinus as well as supraorbital rim fracture on 11/21/2019 which was managed conservatively.  He then presented to the ER at [facility] with symptoms of right-sided chest wall pain this led to a CT PE which did not show any evidence of pulmonary embolism.  He was noted to have bilateral symmetric axillary adenopathy measuring up to 1.8 cm.  Scattered mediastinal adenopathy.  Right paratracheal node measuring up to 1.2 cm.  Prominent right costophrenic lymph node measuring up to 1 cm.  Findings are nonspecific but could be seen with lymphoma versus systemic inflammatory disease such as sarcoidosis.  Of note patient has had a prior CT scan for lung screening back in 2015 when he was not noted to have this adenopathy.    Repeat CT chest in August 2021 showed persistent mild nonbulky axillary and mediastinal adenopathy which had not changed significantly as compared to his prior CT in December 2020.  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   Repeat CT in March 2022 showed Progression and multistation adenopathy as well as  significant splenomegaly measuring 20.4 cm as compared to 14.5 cm on CT scan September 2021.  CT scan showed multistation adenopathy with SUVs ranging between 6-8.9.  Patient underwent another core biopsy of right inguinal lymph node which was consistent with follicular lymphoma grade 1-2.  Bone marrow biopsy also showed moderate involvement with non-Hodgkin's B-cell lymphoma.  Baseline hepatitis B testing showed core antibody positivity.  Seen by GI and started on tenofovir   Patient had symptoms of nausea and sweating during cycle 1 of Rituxan which resolved with additional premedications.  He developed symptoms of itching over neck chest back and bilateral eyes with second dose of Rituxan early and had to get more premedications and was not rechallenged on the same day.  Patient subsequently received cycle 3 of Bendamustine Rituxan chemotherapy after Rituxan was given as a split dose over 3 days   Scans after 4 cycles of Bendamustine Rituxan chemotherapy showedSignificant improvement with therapy.  Lymphadenopathy in his neck and mediastinum resolved Deauville 2.  Decrease in the splenic size and hypermetabolism.  No new lesions.  FDG accumulation within the skeleton presumably due to stimulatory effects of treatment.  Scans in April 2023 showed pulmonary nodules requiring further assessment and patient was seen by pulmonary.  He also had a PET CT scan in October 2023 which showed gradual enlargement and hypermetabolic some noted in the lung nodules.  Patient also has intra-abdominal lymph nodes ranging from 9 to 1.2 cm with an SUV less than 3.  Patient had right lower lobe lung biopsy which was negative for malignancy.  Interval history-patient was recently discharged from the hospital for RSV infection and  is gradually getting better.  He still has ongoing nonproductive cough.  ECOG PS- 1 Pain scale- 0   Review of systems- Review of Systems  Constitutional:  Negative for chills, fever,  malaise/fatigue and weight loss.  HENT:  Negative for congestion, ear discharge and nosebleeds.   Eyes:  Negative for blurred vision.  Respiratory:  Positive for cough. Negative for hemoptysis, sputum production, shortness of breath and wheezing.   Cardiovascular:  Negative for chest pain, palpitations, orthopnea and claudication.  Gastrointestinal:  Negative for abdominal pain, blood in stool, constipation, diarrhea, heartburn, melena, nausea and vomiting.  Genitourinary:  Negative for dysuria, flank pain, frequency, hematuria and urgency.  Musculoskeletal:  Negative for back pain, joint pain and myalgias.  Skin:  Negative for rash.  Neurological:  Negative for dizziness, tingling, focal weakness, seizures, weakness and headaches.  Endo/Heme/Allergies:  Does not bruise/bleed easily.  Psychiatric/Behavioral:  Negative for depression and suicidal ideas. The patient does not have insomnia.       Allergies  Allergen Reactions   Penicillins Anaphylaxis    Tolerates cefdinir, cephalexin and amoxicillin/clavulonate so true PCN allergy unlikely   Tetanus Toxoids Swelling   Lisinopril Cough   Losartan      Other reaction(s): Muscle Pain     Codeine Nausea Only and Nausea And Vomiting   Doxycycline Rash   Ruxience [Rituximab-Pvvr] Rash    05/06/21 pt w/ worsening rash during Ruxience infusion.  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[place])    Arthritis    Asthma    Basal cell carcinoma 01/28/2021   R upper arm, [facility] 03/04/2021   Brain 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   Chronic pain    right arm   COPD (chronic obstructive pulmonary disease) ([place])    Depression    Dyspnea    Elevated liver enzymes    Environmental and seasonal allergies    Femur fracture, left (HCC) 8502   Follicular lymphoma ([place])    History of methicillin resistant staphylococcus aureus (MRSA)    years ago   History of SCC  (squamous cell carcinoma) of skin 05/29/2021   left neck, Moh's 05/29/21   History of SCC (squamous cell carcinoma) of skin    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   left upper back ED&C done   Hypertension    Hyponatremia    Inappropriate sinus tachycardia    Pneumonia    SCC (squamous cell carcinoma) 08/28/2021   upper chest right of midline, EDC done 09/17/2021   SCC (squamous cell carcinoma) 09/30/2022   left chest  ED&C done   Sleep apnea    does not wear CPAP ; uses humidifier instead   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   right upper back, ED&C 06/18/2022   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   right upper back ED&C done   Squamous cell carcinoma of skin 02/19/2021   L inferior mandible, treated with EDC   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/28/2021   R ant shoulder - ED&C   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/28/2021   L upper abdomen - ED&C     Past Surgical History:  Procedure Laterality Date   ANTERIOR CERVICAL DECOMP/DISCECTOMY FUSION N/A 06/17/2016   Procedure: ANTERIOR CERVICAL DECOMPRESSION FUSION, CERVICAL 3-4, CERVICAL 4-5 WITH INSTRUMENTATION AND ALLOGRAFT;  Surgeon: Phylliss Bob, MD;  Location: [facility];  Service: Orthopedics;  Laterality: N/A;  ANTERIOR CERVICAL DECOMPRESSION FUSION, CERVICAL 3-4, CERVICAL 4-5 WITH INSTRUMENTATION AND ALLOGRAFT   BACK SURGERY     x3   NECK SURGERY  12/01/2007   PORTA CATH INSERTION N/A 04/03/2021   Procedure: PORTA CATH INSERTION;  Surgeon: Algernon Huxley, MD;  Location: [facility];  Service: Cardiovascular;  Laterality: N/A;   VIDEO BRONCHOSCOPY WITH ENDOBRONCHIAL ULTRASOUND Bilateral 10/14/2022   Procedure: VIDEO BRONCHOSCOPY WITH ENDOBRONCHIAL ULTRASOUND;  Surgeon: Tyler Pita, MD;  Location: [facility];  Service: Pulmonary;  Laterality: Bilateral;    Social History   Socioeconomic History   Marital status: Divorced    Spouse name: Not on file   Number  of children: Not on file   Years of education: Not on file   Highest education level: Not on file  Occupational History   Not on file  Tobacco Use   Smoking status: Every Day    Types: Cigars   Smokeless tobacco: Never   Tobacco comments:    8 cigars a day 09/17/2022- khj  Vaping Use   Vaping Use: Former   Start date: 04/30/2017   Quit date: 11/30/2017  Substance and Sexual Activity   Alcohol use: Yes    Comment: beers 6 a day   Drug use: No   Sexual activity: Not Currently  Other Topics Concern   Not on file  Social History Narrative   Not on file   Social Determinants of Health   Financial Resource Strain: High Risk (10/27/2021)   Overall Financial Resource Strain (CARDIA)    Difficulty of Paying Living Expenses: Hard  Food Insecurity: No Food Insecurity (11/06/2022)   Hunger Vital Sign    Worried About Running Out of Food in the Last Year: Never true    Ran Out of Food in the Last Year: Never true  Transportation Needs: No Transportation Needs (11/06/2022)   PRAPARE - Hydrologist (Medical): No    Lack of Transportation (Non-Medical): No  Physical Activity: Inactive (10/27/2021)   Exercise Vital Sign    Days of Exercise per Week: 0 days    Minutes of Exercise per Session: 0 min  Stress: No Stress Concern Present (10/27/2021)   [place]    Feeling of Stress : Only a little  Social Connections: Socially Isolated (10/27/2021)   Social Connection and Isolation Panel [NHANES]    Frequency of Communication with Friends and Family: More than three times a week    Frequency of Social Gatherings with Friends and Family: More than three times a week    Attends Religious Services: Never    Marine scientist or Organizations: No    Attends Archivist Meetings: Never    Marital Status: Divorced  Human resources officer Violence: Not At Risk (11/03/2022)   Humiliation, Afraid,  Rape, and Kick questionnaire    Fear of Current or Ex-Partner: No    Emotionally Abused: No    Physically Abused: No    Sexually Abused: No    Family History  Problem Relation Age of Onset   Diabetes Mother    Heart attack Father    Emphysema Father    Diabetes Other      Current Outpatient Medications:    albuterol (VENTOLIN HFA) 108 (90 Base) MCG/ACT inhaler, Inhale 2 puffs into the lungs every 6 (six) hours as needed for wheezing or shortness of breath., Disp: 18 g, Rfl:  2   allopurinol (ZYLOPRIM) 300 MG tablet, TAKE 1 TABLET BY MOUTH ONCE DAILY (Patient taking differently: Take 300 mg by mouth every evening.), Disp: 30 tablet, Rfl: 3   amLODipine (NORVASC) 5 MG tablet, Take 1 tablet (5 mg total) by mouth daily. (Patient taking differently: Take 5 mg by mouth every evening.), Disp: 90 tablet, Rfl: 1   atorvastatin (LIPITOR) 80 MG tablet, Take 1 tablet (80 mg total) by mouth daily. (Patient taking differently: Take 80 mg by mouth every evening.), Disp: 90 tablet, Rfl: 3   benzonatate (TESSALON) 200 MG capsule, Take 1 capsule (200 mg total) by mouth 2 (two) times daily as needed for cough., Disp: 20 capsule, Rfl: 0   buPROPion (WELLBUTRIN SR) 150 MG 12 hr tablet, Take 150 mg by mouth 2 (two) times daily., Disp: , Rfl:    buPROPion (WELLBUTRIN XL) 300 MG 24 hr tablet, Take 1 tablet (300 mg total) by mouth daily. (Patient taking differently: Take 300 mg by mouth every evening.), Disp: 30 tablet, Rfl: 4   calcipotriene (DOVONOX) 0.005 % cream, Apply topically 2 (two) times daily. Apply twice a day after fluorouracil to affected areas as directed in handout. (Patient taking differently: Apply 1 Application topically daily. Apply twice a day after fluorouracil to affected areas as directed in handout.), Disp: 60 g, Rfl: 1   chlorpheniramine-HYDROcodone (TUSSIONEX) 10-8 MG/5ML, Take 5 mLs by mouth every 12 (twelve) hours as needed for up to 5 days for cough., Disp: 50 mL, Rfl: 0   clonazePAM  (KLONOPIN) 0.5 MG tablet, Take 0.5 mg by mouth every evening., Disp: , Rfl:    DULoxetine (CYMBALTA) 60 MG capsule, Take 60 mg by mouth every evening., Disp: , Rfl:    finasteride (PROSCAR) 5 MG tablet, Take 1 tablet (5 mg total) by mouth daily. (Patient taking differently: Take 5 mg by mouth every evening.), Disp: 90 tablet, Rfl: 2   fluorouracil (EFUDEX) 5 % cream, Apply topically 2 (two) times daily. Apply twice a day to affected areas as directed in handout. (Patient taking differently: Apply 1 Application topically daily. Apply twice a day to affected areas as directed in handout.), Disp: 40 g, Rfl: 1   Fluticasone-Umeclidin-Vilant (TRELEGY ELLIPTA) 100-62.5-25 MCG/ACT AEPB, Inhale 1 puff into the lungs daily., Disp: 28 each, Rfl: 11   loratadine (CLARITIN) 10 MG tablet, Take 10 mg by mouth every evening., Disp: , Rfl:    metoprolol succinate (TOPROL-XL) 50 MG 24 hr tablet, Take 2 tablets (100 mg total) by mouth daily. Take with or immediately following a meal. (Patient taking differently: Take 100 mg by mouth every evening. Take with or immediately following a meal.), Disp: 90 tablet, Rfl: 1   mirtazapine (REMERON) 15 MG tablet, Take 15 mg by mouth at bedtime., Disp: , Rfl:    naltrexone (DEPADE) 50 MG tablet, Take 50 mg by mouth daily., Disp: , Rfl:    OLANZapine (ZYPREXA) 5 MG tablet, Take 5 mg by mouth at bedtime., Disp: , Rfl:    PREDNISONE PO, Take by mouth. For recent RSV infection, Disp: , Rfl:    sildenafil (REVATIO) 20 MG tablet, TAKE 3-5 TABLETS BY MOUTH ONCE DAILY AS NEEDED FOR ERECTILE DYSFUNCTION (Patient taking differently: Take 20 mg by mouth as needed.), Disp: 30 tablet, Rfl: 0   tenofovir (VIREAD) 300 MG tablet, Take 300 mg by mouth every evening., Disp: , Rfl:    traMADol (ULTRAM) 50 MG tablet, Take 1 tablet by mouth every 6 (six) hours as needed., Disp: ,  Rfl:    mupirocin ointment (BACTROBAN) 2 %, Apply 1 Application topically daily. Apply to any open wounds until healed.  (Patient not taking: Reported on 11/09/2022), Disp: 22 g, Rfl: 3   testosterone cypionate (DEPOTESTOSTERONE CYPIONATE) 200 MG/ML injection, Inject 1 mL (200 mg total) into the muscle every 14 (fourteen) days. (Patient not taking: Reported on 11/02/2022), Disp: 10 mL, Rfl: 0 No current facility-administered medications for this visit.  Facility-Administered Medications Ordered in Other Visits:    heparin lock flush 100 unit/mL, 500 Units, Intravenous, Once, Sindy Guadeloupe, MD   heparin lock flush 100 unit/mL, 500 Units, Intravenous, Once, Sindy Guadeloupe, MD   sodium chloride flush (NS) 0.9 % injection 10 mL, 10 mL, Intravenous, PRN, Sindy Guadeloupe, MD  Physical exam:  Vitals:   11/09/22 1335 11/09/22 1341  BP:  123/86  Pulse:  (!) 104  Resp:  18  SpO2:  97%  Weight: 238 lb (108 kg)   Height:  _0  (1.88 m)   Physical Exam Cardiovascular:     Rate and Rhythm: Normal rate and regular rhythm.     Heart sounds: Normal heart sounds.  Pulmonary:     Effort: Pulmonary effort is normal.     Breath sounds: Normal breath sounds.  Abdominal:     General: Bowel sounds are normal.     Palpations: Abdomen is soft.  Lymphadenopathy:     Comments: No palpable cervical, supraclavicular, axillary or inguinal adenopathy    Skin:    General: Skin is warm and dry.  Neurological:     Mental Status: He is alert and oriented to person, place, and time.         Latest Ref Rng & Units 11/09/2022    1:23 PM  CMP  Glucose 70 - 99 mg/dL 126   BUN 6 - 20 mg/dL 17   Creatinine 0.61 - 1.24 mg/dL 1.07   Sodium 135 - 145 mmol/L 137   Potassium 3.5 - 5.1 mmol/L 3.4   Chloride 98 - 111 mmol/L 101   CO2 22 - 32 mmol/L 25   Calcium 8.9 - 10.3 mg/dL 8.1   Total Protein 6.5 - 8.1 g/dL 5.8   Total Bilirubin 0.3 - 1.2 mg/dL 0.8   Alkaline Phos 38 - 126 U/L 123   AST 15 - 41 U/L 58   ALT 0 - 44 U/L 69       Latest Ref Rng & Units 11/09/2022    1:23 PM  CBC  WBC 4.0 - 10.5 K/uL 9.7   Hemoglobin  13.0 - 17.0 g/dL 15.8   Hematocrit 39.0 - 52.0 % 45.4   Platelets 150 - 400 K/uL 184     No images are attached to the encounter.  CT HEAD WO CONTRAST (5MM)  Result Date: 11/02/2022 CLINICAL DATA:  Provided history: Syncope/presyncope, cerebrovascular cause suspected. Additional history provided: Shortness of breath, sore throat, cough, nasal congestion, body aches, possible RSV. EXAM: CT HEAD WITHOUT CONTRAST TECHNIQUE: Contiguous axial images were obtained from the base of the skull through the vertex without intravenous contrast. RADIATION DOSE REDUCTION: This exam was performed according to the departmental dose-optimization program which includes automated exposure control, adjustment of the mA and/or kV according to patient size and/or use of iterative reconstruction technique. COMPARISON:  CT 11/21/2019. FINDINGS: Brain: Moderate generalized cerebral atrophy. Mild-to-moderate patchy and ill-defined hypoattenuation within the cerebral white matter, nonspecific but compatible with chronic small vessel ischemic disease. There is no acute intracranial hemorrhage. No demarcated  cortical infarct. No extra-axial fluid collection. No evidence of an intracranial mass. No midline shift. Vascular: No hyperdense vessel. Skull: Chronic fracture deformity of the left frontal calvarium (with involvement of the outer table of the left frontal sinus). No acute calvarial fracture or aggressive osseous lesion. Sinuses/Orbits: No mass or acute finding within the imaged orbits. Chronic medially displaced fracture deformity of the left lamina papyracea. Mild-to-moderate mucosal thickening within the bilateral maxillary sinuses. Mild mucosal thickening within the right sphenoid sinus. Mild-to-moderate mucosal thickening within the left sphenoid sinus. Mild mucosal thickening within the bilateral ethmoid air cells. IMPRESSION: No evidence of acute intracranial abnormality. Mild-to-moderate chronic small vessel ischemic  changes within the cerebral white matter. Chronic fracture deformities of the left frontal calvarium and medial left orbit. Paranasal sinus disease, as described. Electronically Signed   By: Kellie Simmering D.O.   On: 11/02/2022 10:07   DG Chest 2 View  Result Date: 11/01/2022 CLINICAL DATA:  Shortness of breath. EXAM: CHEST - 2 VIEW COMPARISON:  10/14/2022 and prior studies FINDINGS: A RIGHT IJ Port-A-Cath is again noted with tip overlying the UPPER SVC. The cardiomediastinal silhouette is unremarkable. Mild interstitial prominence is unchanged. Nodules identified on prior CTs are difficult to definitely visualize radiographically. There is no evidence of focal airspace disease, pulmonary edema, pleural effusion, or pneumothorax. No acute bony abnormalities are identified. IMPRESSION: 1. No evidence of acute cardiopulmonary disease. 2. Unchanged mild interstitial prominence. No new pulmonary nodules are difficult to definitely identify radiographically. Electronically Signed   By: Margarette Canada M.D.   On: 11/01/2022 21:35   DG Chest Port 1 View  Result Date: 10/14/2022 CLINICAL DATA:  Status post bronchoscopy with biopsy. EXAM: PORTABLE CHEST 1 VIEW COMPARISON:  Chest radiographs 12/12/2021.  Chest CT 09/29/2022. FINDINGS: A right jugular Port-A-Cath remains in place, terminating over the upper SVC. The cardiomediastinal silhouette is within normal limits. Known bilateral lung nodules are not well seen radiographically. No acute airspace consolidation, sizeable pleural effusion, or pneumothorax is identified, with note made of incomplete imaging of the right lateral costophrenic angle. No acute osseous abnormality is seen. IMPRESSION: No evidence of pneumothorax or acute cardiopulmonary disease. Electronically Signed   By: Logan Bores M.D.   On: 10/14/2022 15:18   DG C-Arm 1-60 Min-No Report  Result Date: 10/14/2022 Fluoroscopy was utilized by the requesting physician.  No radiographic interpretation.      Assessment and plan- Patient is a 61 y.o. male  with history of stage IV grade 1 follicular lymphoma s/p 6 cycles of Bendamustine Rituxan chemotherapy.  He is here for routine follow-up of follicular lymphoma  PET CT scan in October 2023 showed gradual increase in the size of lung nodules with low level of hypermetabolism.  Similarly he was noted to have mildly hypermetabolic abdominopelvic lymph nodes concerning for recurrent lymphoma.  Based on this PET scan there was no overt concern for transformation to high-grade lymphoma.  Treatment of recurrent follicular lymphoma is indicated if patient has any ongoing B symptoms cytopenias, organ compromise or worsening effusions which the patient does not have presently.  I plan to get a repeat PET scan in January 2024.  If there is further growth in the size of his lymph nodes I will attempt another biopsy.  Patient did undergo EBUS guided right lower lobe lung biopsy which was negative for malignancy.  For now I am continuing to monitor his lymphoma without any active treatment but I will have low threshold to initiate treatment if he has any  of the above indications.  Patient is interested in quitting smoking again and we have prescribed him nicotine patches.  Abnormal LFTs: He has had them on and off likely secondary to alcoholism.   Visit Diagnosis 1. 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 ([place])   2. Acute bronchiolitis due to unspecified organism      Dr. Randa Evens, MD, MPH [facility], [place] [phone] 11/09/2022 4:18 PM

## 2022-11-10 ENCOUNTER — Other Ambulatory Visit: Payer: Self-pay | Admitting: *Deleted

## 2022-11-10 ENCOUNTER — Ambulatory Visit: Payer: Medicare HMO | Admitting: Nurse Practitioner

## 2022-11-10 MED ORDER — NICOTINE 21 MG/24HR TD PT24: 21.0000 mg | MEDICATED_PATCH | Freq: Every day | TRANSDERMAL | 0 refills | Status: DC

## 2022-11-12 ENCOUNTER — Telehealth: Payer: Self-pay | Admitting: *Deleted

## 2022-11-12 NOTE — Telephone Encounter (Signed)
Filled out the info and wait for the results

## 2022-11-18 ENCOUNTER — Ambulatory Visit: Payer: Medicare HMO | Attending: Pulmonary Disease

## 2022-11-18 DIAGNOSIS — R0602 Shortness of breath: Secondary | ICD-10-CM

## 2022-11-18 DIAGNOSIS — J449 Chronic obstructive pulmonary disease, unspecified: Secondary | ICD-10-CM

## 2022-11-18 DIAGNOSIS — F17210 Nicotine dependence, cigarettes, uncomplicated: Secondary | ICD-10-CM | POA: Diagnosis not present

## 2022-11-18 MED ORDER — ALBUTEROL SULFATE (2.5 MG/3ML) 0.083% IN NEBU
2.5000 mg | INHALATION_SOLUTION | Freq: Once | RESPIRATORY_TRACT | Status: AC
  Administered 2022-11-18: 2.5 mg via RESPIRATORY_TRACT

## 2022-11-20 ENCOUNTER — Encounter: Payer: Self-pay | Admitting: *Deleted

## 2022-11-24 ENCOUNTER — Other Ambulatory Visit: Payer: Self-pay | Admitting: Unknown Physician Specialty

## 2022-11-25 NOTE — Telephone Encounter (Signed)
Unable to refill per protocol,request is too soon. Last refill 08/11/22 for 90 and 1 refill. Will refuse.  Requested Prescriptions  Pending Prescriptions Disp Refills   metoprolol succinate (TOPROL-XL) 100 MG 24 hr tablet [Pharmacy Med Name: METOPROLOL SUCCINATE ER 100 MG TAB] 90 tablet     Sig: TAKE 1 TABLET BY MOUTH ONCE DAILY WITH OR IMMEDIATELY FOLLOWING A MEAL     Cardiovascular:  Beta Blockers Passed - 11/24/2022  4:54 PM      Passed - Last BP in normal range    BP Readings from Last 1 Encounters:  11/09/22 123/86         Passed - Last Heart Rate in normal range    Pulse Readings from Last 1 Encounters:  11/09/22 (!) 104         Passed - Valid encounter within last 6 months    Recent Outpatient Visits           3 weeks ago Stage 2 moderate COPD by GOLD classification ([place])   [place], Dani Gobble, PA-C   3 months ago Primary hypertension   [facility] [place], Malachy Mood, NP   7 months ago Hyperlipidemia, unspecified hyperlipidemia type   [facility] Vigg, Avanti, MD   10 months ago Alcohol use disorder, severe, dependence ([place])   [facility] Vigg, Avanti, MD   11 months ago Upper respiratory tract infection, unspecified type   [place] Vigg, Avanti, MD       Future Appointments             In 2 months Jon Billings, NP [place], [facility]             amLODipine ([place]) 5 MG tablet [Pharmacy Med Name: AMLODIPINE BESYLATE 5 MG TAB] 90 tablet 1    Sig: TAKE 1 TABLET BY MOUTH ONCE DAILY     Cardiovascular: Calcium Channel Blockers 2 Passed - 11/24/2022  4:54 PM      Passed - Last BP in normal range    BP Readings from Last 1 Encounters:  11/09/22 123/86         Passed - Last Heart Rate in normal range    Pulse Readings from Last 1 Encounters:  11/09/22 (!) 104         Passed - Valid encounter within last 6 months    Recent Outpatient Visits           3 weeks ago Stage  2 moderate COPD by GOLD classification ([place])   [place], Dani Gobble, PA-C   3 months ago Primary hypertension   [facility] [place], Malachy Mood, NP   7 months ago Hyperlipidemia, unspecified hyperlipidemia type   [facility] Vigg, Avanti, MD   10 months ago Alcohol use disorder, severe, dependence ([place])   [facility] Vigg, Avanti, MD   11 months ago Upper respiratory tract infection, unspecified type   [place] Vigg, Avanti, MD       Future Appointments             In 2 months Jon Billings, NP [facility], [place]

## 2022-11-27 ENCOUNTER — Encounter: Payer: Self-pay | Admitting: Physician Assistant

## 2022-11-27 ENCOUNTER — Ambulatory Visit (INDEPENDENT_AMBULATORY_CARE_PROVIDER_SITE_OTHER): Payer: Medicare HMO | Admitting: Physician Assistant

## 2022-11-27 VITALS — BP 118/89 | HR 111 | Temp 98.2°F | Wt 241.5 lb

## 2022-11-27 DIAGNOSIS — J0100 Acute maxillary sinusitis, unspecified: Secondary | ICD-10-CM

## 2022-11-27 DIAGNOSIS — J205 Acute bronchitis due to respiratory syncytial virus: Secondary | ICD-10-CM

## 2022-11-27 MED ORDER — PREDNISONE 20 MG PO TABS: 40.0000 mg | ORAL_TABLET | Freq: Every day | ORAL | 0 refills | Status: DC

## 2022-11-27 MED ORDER — CEFDINIR 300 MG PO CAPS: 300.0000 mg | ORAL_CAPSULE | Freq: Two times a day (BID) | ORAL | 0 refills | Status: AC

## 2022-11-27 NOTE — Progress Notes (Signed)
Acute Office Visit   Patient: Russell Simpson   DOB: 1961/10/30   61 y.o. Male  MRN: [id number] Visit Date: 11/27/2022  Today's healthcare provider: Oswaldo Conroy Melony Tenpas, PA-C  Introduced myself to the patient as a Secondary school teacher and provided education on APPs in clinical practice.    Chief Complaint  Patient presents with   Nasal Congestion   Subjective    HPI    Reports he has had a productive cough, sinus pressure and pain, decreased sleep due to coughing, chest tightness and wheezing Onset: gradual  Duration: About a week - symptoms are subjectively worsening  States laying down seems to make coughing worse  He reports he had RSV infection about 3 weeks ago- was hospitalized for 2 days  Interventions: started taking Prednisone 50 mg which was leftover - he reports this improved his symptoms, Delsym  He denies fevers, body aches  He has been using his inhalers as directed  He reports his roommate is also sick and was dx with RSV a few days after he was hospitalized  COVID testing at home: he tested himself last week  Results: negative      Medications: Outpatient Medications Prior to Visit  Medication Sig   albuterol (VENTOLIN HFA) 108 (90 Base) MCG/ACT inhaler Inhale 2 puffs into the lungs every 6 (six) hours as needed for wheezing or shortness of breath.   allopurinol (ZYLOPRIM) 300 MG tablet TAKE 1 TABLET BY MOUTH ONCE DAILY (Patient taking differently: Take 300 mg by mouth every evening.)   amLODipine (NORVASC) 5 MG tablet Take 1 tablet (5 mg total) by mouth daily. (Patient taking differently: Take 5 mg by mouth every evening.)   atorvastatin (LIPITOR) 80 MG tablet Take 1 tablet (80 mg total) by mouth daily. (Patient taking differently: Take 80 mg by mouth every evening.)   buPROPion (WELLBUTRIN SR) 150 MG 12 hr tablet Take 150 mg by mouth 2 (two) times daily.   buPROPion (WELLBUTRIN XL) 300 MG 24 hr tablet Take 1 tablet (300 mg total) by mouth daily. (Patient taking  differently: Take 300 mg by mouth every evening.)   calcipotriene (DOVONOX) 0.005 % cream Apply topically 2 (two) times daily. Apply twice a day after fluorouracil to affected areas as directed in handout. (Patient taking differently: Apply 1 Application topically daily. Apply twice a day after fluorouracil to affected areas as directed in handout.)   clonazePAM (KLONOPIN) 0.5 MG tablet Take 0.5 mg by mouth every evening.   DULoxetine (CYMBALTA) 60 MG capsule Take 60 mg by mouth every evening.   finasteride (PROSCAR) 5 MG tablet Take 1 tablet (5 mg total) by mouth daily. (Patient taking differently: Take 5 mg by mouth every evening.)   fluorouracil (EFUDEX) 5 % cream Apply topically 2 (two) times daily. Apply twice a day to affected areas as directed in handout. (Patient taking differently: Apply 1 Application topically daily. Apply twice a day to affected areas as directed in handout.)   Fluticasone-Umeclidin-Vilant (TRELEGY ELLIPTA) 100-62.5-25 MCG/ACT AEPB Inhale 1 puff into the lungs daily.   loratadine (CLARITIN) 10 MG tablet Take 10 mg by mouth every evening.   mirtazapine (REMERON) 15 MG tablet Take 15 mg by mouth at bedtime.   mupirocin ointment (BACTROBAN) 2 % Apply 1 Application topically daily. Apply to any open wounds until healed.   naltrexone (DEPADE) 50 MG tablet Take 50 mg by mouth daily.   nicotine (NICODERM CQ - DOSED IN MG/24 HOURS) 21 mg/24hr  patch Place 1 patch (21 mg total) onto the skin daily.   OLANZapine (ZYPREXA) 5 MG tablet Take 5 mg by mouth at bedtime.   sildenafil (REVATIO) 20 MG tablet TAKE 3-5 TABLETS BY MOUTH ONCE DAILY AS NEEDED FOR ERECTILE DYSFUNCTION (Patient taking differently: Take 20 mg by mouth as needed.)   testosterone cypionate (DEPOTESTOSTERONE CYPIONATE) 200 MG/ML injection Inject 1 mL (200 mg total) into the muscle every 14 (fourteen) days.   traMADol (ULTRAM) 50 MG tablet Take 1 tablet by mouth every 6 (six) hours as needed.   [DISCONTINUED] PREDNISONE  PO Take 50 mg by mouth daily. For recent RSV infection   metoprolol succinate (TOPROL-XL) 50 MG 24 hr tablet Take 2 tablets (100 mg total) by mouth daily. Take with or immediately following a meal. (Patient taking differently: Take 100 mg by mouth every evening. Take with or immediately following a meal.)   tenofovir (VIREAD) 300 MG tablet Take 300 mg by mouth every evening.   [DISCONTINUED] benzonatate (TESSALON) 200 MG capsule Take 1 capsule (200 mg total) by mouth 2 (two) times daily as needed for cough.   Facility-Administered Medications Prior to Visit  Medication Dose Route Frequency Provider   heparin lock flush 100 unit/mL  500 Units Intravenous Once Creig Hines, MD   heparin lock flush 100 unit/mL  500 Units Intravenous Once Creig Hines, MD   sodium chloride flush (NS) 0.9 % injection 10 mL  10 mL Intravenous PRN Creig Hines, MD    Review of Systems  Constitutional:  Positive for diaphoresis. Negative for chills, fatigue and fever.  HENT:  Positive for congestion, postnasal drip, sinus pressure and sinus pain. Negative for ear pain and sore throat.   Respiratory:  Positive for cough, chest tightness, shortness of breath and wheezing.   Gastrointestinal:  Negative for diarrhea, nausea and vomiting.  Musculoskeletal:  Negative for myalgias.  Neurological:  Negative for dizziness, light-headedness and headaches.       Objective    BP 118/89   Pulse (!) 111   Temp 98.2 F (36.8 C) (Oral)   Wt 241 lb 8 oz (109.5 kg)   SpO2 98%   BMI 31.01 kg/m    Physical Exam Vitals reviewed.  Constitutional:      General: He is awake.     Appearance: Normal appearance. He is well-developed and well-groomed.  HENT:     Head: Normocephalic and atraumatic.     Right Ear: Hearing, tympanic membrane, ear canal and external ear normal.     Left Ear: Hearing, tympanic membrane, ear canal and external ear normal.     Mouth/Throat:     Lips: Pink.     Pharynx: Oropharynx is clear.  Uvula midline. No pharyngeal swelling, oropharyngeal exudate, posterior oropharyngeal erythema or uvula swelling.  Eyes:     General: Lids are normal. Gaze aligned appropriately.     Extraocular Movements: Extraocular movements intact.     Conjunctiva/sclera: Conjunctivae normal.  Pulmonary:     Effort: Pulmonary effort is normal.     Breath sounds: Normal breath sounds. No decreased air movement. No decreased breath sounds, wheezing, rhonchi or rales.  Musculoskeletal:     Cervical back: Normal range of motion and neck supple.  Skin:    General: Skin is warm.  Neurological:     General: No focal deficit present.     Mental Status: He is alert and oriented to person, place, and time.  Psychiatric:  Mood and Affect: Mood normal.        Behavior: Behavior normal. Behavior is cooperative.        Thought Content: Thought content normal.        Judgment: Judgment normal.       No results found for any visits on 11/27/22.  Assessment & Plan      No follow-ups on file.       Problem List Items Addressed This Visit   None Visit Diagnoses     Acute maxillary sinusitis, recurrence not specified    -  Primary Acute, ongoing Symptoms are consistent with bacterial sinusitis at this time  Reviewed Allergy list but patient is not sure of abx allergies today- will go by the notes and send in script for Omnicef 300 mg PO BID x 7 days  along with Prednisone given his hx of lung disease Reviewed ED and return precautions OTC medication recommendations discussed and outlined in AVS  Follow up as needed for persistent or progressing symptoms     Relevant Medications   cefdinir (OMNICEF) 300 MG capsule   predniSONE (DELTASONE) 20 MG tablet   Acute bronchitis due to respiratory syncytial virus (RSV)   Reviewed ED - hospital notes from visit earlier in Dec as this is likely contributory to current illness    See Acute Maxillary Sinusitis for A&P     Relevant Medications   cefdinir  (OMNICEF) 300 MG capsule   predniSONE (DELTASONE) 20 MG tablet        No follow-ups on file.   I, Dennys Guin E Yashua Bracco, PA-C, have reviewed all documentation for this visit. The documentation on 11/27/22 for the exam, diagnosis, procedures, and orders are all accurate and complete.   Jacquelin Hawking, MHS, PA-C [facility]

## 2022-11-27 NOTE — Patient Instructions (Addendum)
Based on your symptoms and duration of illness, I believe you may have a bacterial sinus infection  These typically resolve with antibiotic therapy along with at-home comfort measures  Today I have sent in a prescription for Omnicef 300 mg to be taken by mouth twice per day for 7 days  FINISH THE ENTIRE COURSE unless you develop an allergic reaction or are instructed to discontinue.  It can take a few days for the antibiotic to kick in so I recommend symptomatic relief with over the counter medication such as the following:  Dayquil/ Nyquil Theraflu Alkaseltzer  Coricidin - if you have high blood pressure even if it is well managed with medications  These medications typically have Tylenol in them already so you can take Ibuprofen as needed for further pain/ discomfort and fever management/ do not need to supplement with more outside of those medications  I highly recommend taking Mucinex daily for the congestion- make sure to drink plenty of water while taking this  Stay well hydrated with at least 75 oz of water per day to help with recovery  If you notice any of the following please let us know: increased fever not responding to Tylenol or Ibuprofen, swelling around your nose or eyes, difficulty seeing

## 2022-11-30 ENCOUNTER — Emergency Department: Payer: Medicare HMO

## 2022-11-30 ENCOUNTER — Inpatient Hospital Stay: Payer: Medicare HMO

## 2022-11-30 ENCOUNTER — Other Ambulatory Visit: Payer: Self-pay

## 2022-11-30 ENCOUNTER — Inpatient Hospital Stay
Admission: EM | Admit: 2022-11-30 | Discharge: 2022-12-03 | DRG: 493 | Disposition: A | Discharge status: Home / Self Care | Payer: Medicare HMO | Attending: Orthopedic Surgery | Admitting: Orthopedic Surgery

## 2022-11-30 DIAGNOSIS — Z888 Allergy status to other drugs, medicaments and biological substances status: Secondary | ICD-10-CM | POA: Diagnosis not present

## 2022-11-30 DIAGNOSIS — E785 Hyperlipidemia, unspecified: Secondary | ICD-10-CM | POA: Diagnosis present

## 2022-11-30 DIAGNOSIS — E8729 Other acidosis: Secondary | ICD-10-CM | POA: Diagnosis not present

## 2022-11-30 DIAGNOSIS — Z880 Allergy status to penicillin: Secondary | ICD-10-CM | POA: Diagnosis not present

## 2022-11-30 DIAGNOSIS — Y92009 Unspecified place in unspecified non-institutional (private) residence as the place of occurrence of the external cause: Secondary | ICD-10-CM | POA: Diagnosis not present

## 2022-11-30 DIAGNOSIS — G8929 Other chronic pain: Secondary | ICD-10-CM | POA: Diagnosis present

## 2022-11-30 DIAGNOSIS — Z885 Allergy status to narcotic agent status: Secondary | ICD-10-CM | POA: Diagnosis not present

## 2022-11-30 DIAGNOSIS — F17290 Nicotine dependence, other tobacco product, uncomplicated: Secondary | ICD-10-CM | POA: Diagnosis present

## 2022-11-30 DIAGNOSIS — G4730 Sleep apnea, unspecified: Secondary | ICD-10-CM | POA: Diagnosis present

## 2022-11-30 DIAGNOSIS — S82851A Displaced trimalleolar fracture of right lower leg, initial encounter for closed fracture: Secondary | ICD-10-CM | POA: Diagnosis not present

## 2022-11-30 DIAGNOSIS — F32A Depression, unspecified: Secondary | ICD-10-CM | POA: Diagnosis present

## 2022-11-30 DIAGNOSIS — Z7951 Long term (current) use of inhaled steroids: Secondary | ICD-10-CM | POA: Diagnosis not present

## 2022-11-30 DIAGNOSIS — W1830XA Fall on same level, unspecified, initial encounter: Secondary | ICD-10-CM | POA: Diagnosis present

## 2022-11-30 DIAGNOSIS — S82431A Displaced oblique fracture of shaft of right fibula, initial encounter for closed fracture: Secondary | ICD-10-CM | POA: Diagnosis not present

## 2022-11-30 DIAGNOSIS — S93421A Sprain of deltoid ligament of right ankle, initial encounter: Secondary | ICD-10-CM | POA: Diagnosis not present

## 2022-11-30 DIAGNOSIS — J449 Chronic obstructive pulmonary disease, unspecified: Secondary | ICD-10-CM | POA: Diagnosis not present

## 2022-11-30 DIAGNOSIS — Z825 Family history of asthma and other chronic lower respiratory diseases: Secondary | ICD-10-CM

## 2022-11-30 DIAGNOSIS — R7989 Other specified abnormal findings of blood chemistry: Secondary | ICD-10-CM

## 2022-11-30 DIAGNOSIS — S93431A Sprain of tibiofibular ligament of right ankle, initial encounter: Secondary | ICD-10-CM | POA: Diagnosis present

## 2022-11-30 DIAGNOSIS — I5032 Chronic diastolic (congestive) heart failure: Secondary | ICD-10-CM | POA: Diagnosis not present

## 2022-11-30 DIAGNOSIS — S8290XA Unspecified fracture of unspecified lower leg, initial encounter for closed fracture: Secondary | ICD-10-CM | POA: Diagnosis not present

## 2022-11-30 DIAGNOSIS — Z86011 Personal history of benign neoplasm of the brain: Secondary | ICD-10-CM

## 2022-11-30 DIAGNOSIS — E872 Acidosis, unspecified: Secondary | ICD-10-CM | POA: Diagnosis not present

## 2022-11-30 DIAGNOSIS — W010XXA Fall on same level from slipping, tripping and stumbling without subsequent striking against object, initial encounter: Secondary | ICD-10-CM | POA: Diagnosis not present

## 2022-11-30 DIAGNOSIS — Z8572 Personal history of non-Hodgkin lymphomas: Secondary | ICD-10-CM

## 2022-11-30 DIAGNOSIS — S82891A Other fracture of right lower leg, initial encounter for closed fracture: Secondary | ICD-10-CM | POA: Diagnosis not present

## 2022-11-30 DIAGNOSIS — Z7982 Long term (current) use of aspirin: Secondary | ICD-10-CM | POA: Diagnosis not present

## 2022-11-30 DIAGNOSIS — Z6830 Body mass index (BMI) 30.0-30.9, adult: Secondary | ICD-10-CM | POA: Diagnosis not present

## 2022-11-30 DIAGNOSIS — S93432A Sprain of tibiofibular ligament of left ankle, initial encounter: Secondary | ICD-10-CM | POA: Diagnosis not present

## 2022-11-30 DIAGNOSIS — Z5989 Other problems related to housing and economic circumstances: Secondary | ICD-10-CM

## 2022-11-30 DIAGNOSIS — Z85828 Personal history of other malignant neoplasm of skin: Secondary | ICD-10-CM | POA: Diagnosis not present

## 2022-11-30 DIAGNOSIS — Z1152 Encounter for screening for COVID-19: Secondary | ICD-10-CM | POA: Diagnosis not present

## 2022-11-30 DIAGNOSIS — R918 Other nonspecific abnormal finding of lung field: Secondary | ICD-10-CM | POA: Diagnosis not present

## 2022-11-30 DIAGNOSIS — S82831A Other fracture of upper and lower end of right fibula, initial encounter for closed fracture: Secondary | ICD-10-CM | POA: Diagnosis not present

## 2022-11-30 DIAGNOSIS — E669 Obesity, unspecified: Secondary | ICD-10-CM | POA: Diagnosis not present

## 2022-11-30 DIAGNOSIS — S82899A Other fracture of unspecified lower leg, initial encounter for closed fracture: Secondary | ICD-10-CM

## 2022-11-30 DIAGNOSIS — Z8249 Family history of ischemic heart disease and other diseases of the circulatory system: Secondary | ICD-10-CM

## 2022-11-30 DIAGNOSIS — F10929 Alcohol use, unspecified with intoxication, unspecified: Secondary | ICD-10-CM | POA: Diagnosis not present

## 2022-11-30 DIAGNOSIS — S8251XA Displaced fracture of medial malleolus of right tibia, initial encounter for closed fracture: Secondary | ICD-10-CM | POA: Diagnosis not present

## 2022-11-30 DIAGNOSIS — F1020 Alcohol dependence, uncomplicated: Secondary | ICD-10-CM | POA: Diagnosis not present

## 2022-11-30 DIAGNOSIS — I10 Essential (primary) hypertension: Secondary | ICD-10-CM | POA: Diagnosis present

## 2022-11-30 DIAGNOSIS — Z9221 Personal history of antineoplastic chemotherapy: Secondary | ICD-10-CM

## 2022-11-30 DIAGNOSIS — I509 Heart failure, unspecified: Secondary | ICD-10-CM | POA: Diagnosis not present

## 2022-11-30 DIAGNOSIS — Z79899 Other long term (current) drug therapy: Secondary | ICD-10-CM | POA: Diagnosis not present

## 2022-11-30 DIAGNOSIS — E871 Hypo-osmolality and hyponatremia: Secondary | ICD-10-CM | POA: Diagnosis present

## 2022-11-30 DIAGNOSIS — T7840XA Allergy, unspecified, initial encounter: Secondary | ICD-10-CM | POA: Diagnosis not present

## 2022-11-30 DIAGNOSIS — W19XXXA Unspecified fall, initial encounter: Secondary | ICD-10-CM | POA: Diagnosis not present

## 2022-11-30 DIAGNOSIS — I110 Hypertensive heart disease with heart failure: Secondary | ICD-10-CM | POA: Diagnosis not present

## 2022-11-30 DIAGNOSIS — Z981 Arthrodesis status: Secondary | ICD-10-CM

## 2022-11-30 DIAGNOSIS — R059 Cough, unspecified: Secondary | ICD-10-CM | POA: Diagnosis not present

## 2022-11-30 DIAGNOSIS — Z833 Family history of diabetes mellitus: Secondary | ICD-10-CM

## 2022-11-30 DIAGNOSIS — Z9889 Other specified postprocedural states: Secondary | ICD-10-CM | POA: Diagnosis not present

## 2022-11-30 LAB — TROPONIN I (HIGH SENSITIVITY): Troponin I (High Sensitivity): 6 ng/L (ref <18)

## 2022-11-30 LAB — BASIC METABOLIC PANEL
Anion gap: 16 — ABNORMAL HIGH (ref 5–15)
BUN: 15 mg/dL (ref 6–20)
CO2: 18 mmol/L — ABNORMAL LOW (ref 22–32)
Calcium: 8.3 mg/dL — ABNORMAL LOW (ref 8.9–10.3)
Chloride: 96 mmol/L — ABNORMAL LOW (ref 98–111)
Creatinine, Ser: 1.29 mg/dL — ABNORMAL HIGH (ref 0.61–1.24)
GFR, Estimated: >60 mL/min (ref >60)
Glucose, Bld: 192 mg/dL — ABNORMAL HIGH (ref 70–99)
Potassium: 4 mmol/L (ref 3.5–5.1)
Sodium: 130 mmol/L — ABNORMAL LOW (ref 135–145)

## 2022-11-30 LAB — HEPATIC FUNCTION PANEL
ALT: 59 U/L — ABNORMAL HIGH (ref 0–44)
AST: 45 U/L — ABNORMAL HIGH (ref 15–41)
Albumin: 3.9 g/dL (ref 3.5–5.0)
Alkaline Phosphatase: 165 U/L — ABNORMAL HIGH (ref 38–126)
Bilirubin, Direct: <0.1 mg/dL (ref 0.0–0.2)
Total Bilirubin: 0.8 mg/dL (ref 0.3–1.2)
Total Protein: 6.3 g/dL — ABNORMAL LOW (ref 6.5–8.1)

## 2022-11-30 LAB — LACTIC ACID, PLASMA
Lactic Acid, Venous: 5.9 mmol/L (ref 0.5–1.9)
Lactic Acid, Venous: 1.8 mmol/L (ref 0.5–1.9)

## 2022-11-30 LAB — PHOSPHORUS: Phosphorus: 4.9 mg/dL — ABNORMAL HIGH (ref 2.5–4.6)

## 2022-11-30 LAB — URINALYSIS, ROUTINE W REFLEX MICROSCOPIC
Bilirubin Urine: NEGATIVE
Glucose, UA: NEGATIVE mg/dL
Hgb urine dipstick: NEGATIVE
Ketones, ur: NEGATIVE mg/dL
Leukocytes,Ua: NEGATIVE
Nitrite: NEGATIVE
Protein, ur: NEGATIVE mg/dL
Specific Gravity, Urine: 1.005 (ref 1.005–1.030)
pH: 5 (ref 5.0–8.0)

## 2022-11-30 LAB — PROTIME-INR
INR: 1 (ref 0.8–1.2)
Prothrombin Time: 13.3 seconds (ref 11.4–15.2)

## 2022-11-30 LAB — CBC
HCT: 43.4 % (ref 39.0–52.0)
Hemoglobin: 14.9 g/dL (ref 13.0–17.0)
MCH: 32.2 pg (ref 26.0–34.0)
MCHC: 34.3 g/dL (ref 30.0–36.0)
MCV: 93.7 fL (ref 80.0–100.0)
Platelets: 175 10*3/uL (ref 150–400)
RBC: 4.63 MIL/uL (ref 4.22–5.81)
RDW: 12.9 % (ref 11.5–15.5)
WBC: 13.2 10*3/uL — ABNORMAL HIGH (ref 4.0–10.5)
nRBC: 0 % (ref 0.0–0.2)

## 2022-11-30 LAB — RESP PANEL BY RT-PCR (RSV, FLU A&B, COVID)  RVPGX2
Influenza A by PCR: NEGATIVE
Influenza B by PCR: NEGATIVE
Resp Syncytial Virus by PCR: NEGATIVE
SARS Coronavirus 2 by RT PCR: NEGATIVE

## 2022-11-30 LAB — APTT: aPTT: 24 seconds (ref 24–36)

## 2022-11-30 LAB — CK: Total CK: 90 U/L (ref 49–397)

## 2022-11-30 LAB — MAGNESIUM: Magnesium: 2.3 mg/dL (ref 1.7–2.4)

## 2022-11-30 MED ORDER — SODIUM CHLORIDE 0.9 % IV SOLN: INTRAVENOUS | Status: DC

## 2022-11-30 MED ORDER — FENTANYL CITRATE PF 50 MCG/ML IJ SOSY
100.0000 ug | PREFILLED_SYRINGE | Freq: Once | INTRAMUSCULAR | Status: AC
  Administered 2022-11-30: 100 ug via INTRAVENOUS
  Filled 2022-11-30: qty 2

## 2022-11-30 MED ORDER — UMECLIDINIUM BROMIDE 62.5 MCG/ACT IN AEPB
1.0000 | INHALATION_SPRAY | Freq: Every day | RESPIRATORY_TRACT | Status: DC
  Administered 2022-12-02 – 2022-12-03 (×2): 1 via RESPIRATORY_TRACT
  Filled 2022-11-30: qty 7

## 2022-11-30 MED ORDER — CLONAZEPAM 0.5 MG PO TABS
0.5000 mg | ORAL_TABLET | Freq: Every evening | ORAL | Status: DC | PRN
  Administered 2022-11-30 – 2022-12-02 (×2): 0.5 mg via ORAL
  Filled 2022-11-30 (×2): qty 1

## 2022-11-30 MED ORDER — FINASTERIDE 5 MG PO TABS
5.0000 mg | ORAL_TABLET | Freq: Every evening | ORAL | Status: DC
  Administered 2022-12-01 – 2022-12-02 (×2): 5 mg via ORAL

## 2022-11-30 MED ORDER — MONTELUKAST SODIUM 10 MG PO TABS
10.0000 mg | ORAL_TABLET | Freq: Every day | ORAL | Status: DC
  Administered 2022-12-02 – 2022-12-03 (×2): 10 mg via ORAL
  Filled 2022-11-30 (×2): qty 1

## 2022-11-30 MED ORDER — FLEET ENEMA 7-19 GM/118ML RE ENEM: 1.0000 | ENEMA | Freq: Once | RECTAL | Status: DC | PRN

## 2022-11-30 MED ORDER — ALBUTEROL SULFATE (2.5 MG/3ML) 0.083% IN NEBU: 3.0000 mL | INHALATION_SOLUTION | Freq: Four times a day (QID) | RESPIRATORY_TRACT | Status: DC | PRN

## 2022-11-30 MED ORDER — FLUTICASONE FUROATE-VILANTEROL 100-25 MCG/ACT IN AEPB
1.0000 | INHALATION_SPRAY | Freq: Every day | RESPIRATORY_TRACT | Status: DC
  Administered 2022-12-02 – 2022-12-03 (×2): 1 via RESPIRATORY_TRACT
  Filled 2022-11-30: qty 28

## 2022-11-30 MED ORDER — LACTATED RINGERS IV BOLUS
1000.0000 mL | Freq: Once | INTRAVENOUS | Status: AC
  Administered 2022-11-30: 1000 mL via INTRAVENOUS

## 2022-11-30 MED ORDER — OXYCODONE HCL 5 MG PO TABS
5.0000 mg | ORAL_TABLET | ORAL | Status: DC | PRN
  Administered 2022-11-30: 10 mg via ORAL
  Filled 2022-11-30: qty 2

## 2022-11-30 MED ORDER — METOPROLOL SUCCINATE ER 25 MG PO TB24
100.0000 mg | ORAL_TABLET | Freq: Every day | ORAL | Status: DC
  Administered 2022-12-01 – 2022-12-03 (×3): 100 mg via ORAL
  Filled 2022-11-30: qty 2
  Filled 2022-11-30 (×2): qty 4

## 2022-11-30 MED ORDER — DOCUSATE SODIUM 100 MG PO CAPS
100.0000 mg | ORAL_CAPSULE | Freq: Two times a day (BID) | ORAL | Status: DC
  Administered 2022-11-30: 100 mg via ORAL
  Filled 2022-11-30: qty 1

## 2022-11-30 MED ORDER — FAMOTIDINE 20 MG PO TABS
20.0000 mg | ORAL_TABLET | Freq: Two times a day (BID) | ORAL | Status: DC
  Administered 2022-11-30 – 2022-12-02 (×3): 20 mg via ORAL
  Filled 2022-11-30: qty 1

## 2022-11-30 MED ORDER — AMLODIPINE BESYLATE 5 MG PO TABS
5.0000 mg | ORAL_TABLET | Freq: Every evening | ORAL | Status: DC
  Administered 2022-11-30 – 2022-12-01 (×2): 5 mg via ORAL
  Filled 2022-11-30: qty 1

## 2022-11-30 MED ORDER — FOLIC ACID 1 MG PO TABS
1.0000 mg | ORAL_TABLET | Freq: Every day | ORAL | Status: DC
  Administered 2022-11-30 – 2022-12-03 (×2): 1 mg via ORAL
  Filled 2022-11-30 (×3): qty 1

## 2022-11-30 MED ORDER — DULOXETINE HCL 60 MG PO CPEP
60.0000 mg | ORAL_CAPSULE | Freq: Every evening | ORAL | Status: DC
  Administered 2022-11-30 – 2022-12-02 (×3): 60 mg via ORAL
  Filled 2022-11-30 (×4): qty 1

## 2022-11-30 MED ORDER — OLANZAPINE 5 MG PO TABS
5.0000 mg | ORAL_TABLET | Freq: Every day | ORAL | Status: DC
  Administered 2022-12-01 – 2022-12-02 (×2): 5 mg via ORAL
  Filled 2022-11-30 (×2): qty 1

## 2022-11-30 MED ORDER — MORPHINE SULFATE (PF) 2 MG/ML IV SOLN
2.0000 mg | INTRAVENOUS | Status: DC | PRN
  Administered 2022-11-30 – 2022-12-01 (×3): 2 mg via INTRAVENOUS
  Filled 2022-11-30 (×3): qty 1

## 2022-11-30 MED ORDER — LORATADINE 10 MG PO TABS
10.0000 mg | ORAL_TABLET | Freq: Every evening | ORAL | Status: DC
  Administered 2022-12-02: 10 mg via ORAL

## 2022-11-30 MED ORDER — METHOCARBAMOL 1000 MG/10ML IJ SOLN: 500.0000 mg | Freq: Four times a day (QID) | INTRAVENOUS | Status: DC | PRN

## 2022-11-30 MED ORDER — METHOCARBAMOL 500 MG PO TABS
500.0000 mg | ORAL_TABLET | Freq: Four times a day (QID) | ORAL | Status: DC | PRN
  Administered 2022-11-30 – 2022-12-03 (×3): 500 mg via ORAL
  Filled 2022-11-30 (×2): qty 1

## 2022-11-30 MED ORDER — THIAMINE HCL 100 MG/ML IJ SOLN
100.0000 mg | Freq: Every day | INTRAMUSCULAR | Status: DC
  Administered 2022-11-30: 100 mg via INTRAVENOUS
  Filled 2022-11-30: qty 1
  Filled 2022-11-30: qty 2
  Filled 2022-11-30: qty 1

## 2022-11-30 MED ORDER — NALTREXONE HCL 50 MG PO TABS
50.0000 mg | ORAL_TABLET | Freq: Every day | ORAL | Status: DC
  Administered 2022-12-03: 50 mg via ORAL
  Filled 2022-11-30 (×3): qty 1

## 2022-11-30 MED ORDER — CALCIPOTRIENE 0.005 % EX CREA: TOPICAL_CREAM | Freq: Two times a day (BID) | CUTANEOUS | Status: DC

## 2022-11-30 MED ORDER — BISACODYL 5 MG PO TBEC: 5.0000 mg | DELAYED_RELEASE_TABLET | Freq: Every day | ORAL | Status: DC | PRN

## 2022-11-30 MED ORDER — CEFAZOLIN SODIUM-DEXTROSE 2-4 GM/100ML-% IV SOLN
2.0000 g | INTRAVENOUS | Status: AC
  Administered 2022-12-01: 2 g via INTRAVENOUS

## 2022-11-30 MED ORDER — TENOFOVIR DISOPROXIL FUMARATE 300 MG PO TABS
300.0000 mg | ORAL_TABLET | Freq: Every evening | ORAL | Status: DC
  Administered 2022-11-30 – 2022-12-02 (×3): 300 mg via ORAL
  Filled 2022-11-30 (×5): qty 1

## 2022-11-30 MED ORDER — BUPROPION HCL ER (SR) 150 MG PO TB12
150.0000 mg | ORAL_TABLET | Freq: Two times a day (BID) | ORAL | Status: DC
  Administered 2022-11-30 – 2022-12-03 (×5): 150 mg via ORAL
  Filled 2022-11-30 (×6): qty 1

## 2022-11-30 MED ORDER — SODIUM CHLORIDE 0.9 % IV BOLUS
1000.0000 mL | Freq: Once | INTRAVENOUS | Status: AC
  Administered 2022-11-30: 1000 mL via INTRAVENOUS

## 2022-11-30 MED ORDER — SENNOSIDES-DOCUSATE SODIUM 8.6-50 MG PO TABS: 1.0000 | ORAL_TABLET | Freq: Every evening | ORAL | Status: DC | PRN

## 2022-11-30 MED ORDER — MIRTAZAPINE 15 MG PO TABS
15.0000 mg | ORAL_TABLET | Freq: Every day | ORAL | Status: DC
  Administered 2022-11-30 – 2022-12-02 (×3): 15 mg via ORAL
  Filled 2022-11-30 (×3): qty 1

## 2022-11-30 NOTE — ED Notes (Signed)
Pt to be noted coughing, choking episodes, recently dx with RSV earlier in Dec.

## 2022-11-30 NOTE — Assessment & Plan Note (Signed)
In the setting of ground level fall while intoxicated.   - Management per primary team (Orthopedic Surgery)

## 2022-11-30 NOTE — Consult Note (Addendum)
Initial Consultation Note   Patient: Russell Simpson [name] DOB: Apr 13, 1961 PCP: Russell Billings, NP DOA: 11/30/2022 DOS: the patient was seen and examined on 11/30/2022 Primary service: [place], MD  Referring physician: Dr. Archie Balboa Reason for consult: AGMA; pre-op clearance   Assessment/Plan:  Assessment and Plan: High anion gap metabolic acidosis Suspect a combination of alcoholic ketoacidosis and lactic acidosis.  Lactic acid is elevated at 5.9.  Low suspicion for underlying infection. No abdominal pain, nausea or vomiting to suggest pancreatitis.   - Aggressive IV fluid resuscitation - Hepatic function panel, magnesium, phosphorus, CK pending - Repeat lactic acid after IVF  Ankle fracture In the setting of ground level fall while intoxicated.   - Management per primary team (Orthopedic Surgery)  COPD (chronic obstructive pulmonary disease) ([place]) No wheezing on examination today.   - Continue home bronchodilators - Guaifenesin for chronic cough  HTN (hypertension) - Continue home antihypertensives  Abnormal LFTs In the setting of active alcohol use disorder.  LFT elevations are chronic in nature and no history of cirrhosis.  -Monitor LFTs while admitted with daily CMP  Hyponatremia History of intermittent hyponatremia that is mild in nature likely beer potomania given high daily beer intake.   - Monitor BMP while admitted  Chronic diastolic CHF (congestive heart failure) (HCC) Euvolemic at this time. Last echo in 2021 with preserved EF with G1DD.   - Monitor fluid status while receiving IVF  Alcohol use disorder, severe, dependence ([name]) Patient states he currently drinks 6-12 beers per day. No history of alcohol withdrawal  - CIWA without Ativan - Thiamine and folic acid - Counseling on importance of cessation. Patient is motivated.    TRH will follow along.   HPI: Russell Simpson is a 62 y.o. male with medical history significant of  hypertension, hyperlipidemia, inappropriate sinus tachycardia, COPD, stage IV follicular lymphoma s/p chemotherapy, idiopathic hyperprolactinemia, bipolar disorder, panic disorder who presents to the ED after a ground-level fall with ankle pain.  Russell Simpson states he was in his usual state of health this morning at 4:30 AM when he tripping on the floor trying to hang up his jacket.  He states that he was up drinking and he went outside to have a cigarette.  He denies any symptoms prior to the fall, denies any loss of consciousness or hitting his head.  He notes that he drinks approximately 6-12 beers per day and he understands that his drinking has led to falls in the past.  He is motivated to quit but states that he drinks due to boredom.  He denies any fever, chills, nausea, vomiting, diarrhea, abdominal pain, shortness of breath, chest pain, palpitations, lower extremity swelling.  He notes a cough that has been ongoing for the last 4 weeks after contracting RSV.  ED course: On arrival to the ED, patient was normotensive at 120/81 with heart rate of 96.  He was saturating at 95% on room air. Initial workup remarkable for WBC of 13.2, sodium of 130, bicarb of 18 with lactic acid of 5.9 and anion gap of 16.  Creatinine elevated at 1.29.  Initial ankle x-ray with acute spiral fracture of the distal fibula with lateral dislocation.  Splint was placed by EDP.  Due to metabolic acidosis, orthopedic surgery requested medical consultation prior to operation today.  Review of Systems: As mentioned in the history of present illness. All other systems reviewed and are negative.  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[place])  Arthritis    Asthma    Basal cell carcinoma 01/28/2021   R upper arm, [facility] 03/04/2021   Brain 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   Chronic pain    right arm   COPD (chronic obstructive pulmonary disease) ([place])    Depression    Dyspnea     Elevated liver enzymes    Environmental and seasonal allergies    Femur fracture, left ([place]) 2202   Follicular lymphoma ([place])    History of methicillin resistant staphylococcus aureus (MRSA)    years ago   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   left neck, Moh's 05/29/21   History of SCC (squamous cell carcinoma) of skin    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   left upper back ED&C done   Hypertension    Hyponatremia    Inappropriate sinus tachycardia    Pneumonia    SCC (squamous cell carcinoma) 08/28/2021   upper chest right of midline, EDC done 09/17/2021   SCC (squamous cell carcinoma) 09/30/2022   left chest  ED&C done   Sleep apnea    does not wear CPAP ; uses humidifier instead   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   right upper back, ED&C 06/18/2022   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   right upper back ED&C done   Squamous cell carcinoma of skin 02/19/2021   L inferior mandible, treated with EDC   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/28/2021   R ant shoulder - ED&C   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/28/2021   L upper abdomen - ED&C   Past Surgical History:  Procedure Laterality Date   ANTERIOR CERVICAL DECOMP/DISCECTOMY FUSION N/A 06/17/2016   Procedure: ANTERIOR CERVICAL DECOMPRESSION FUSION, CERVICAL 3-4, CERVICAL 4-5 WITH INSTRUMENTATION AND ALLOGRAFT;  Surgeon: Phylliss Bob, MD;  Location: [facility];  Service: Orthopedics;  Laterality: N/A;  ANTERIOR CERVICAL DECOMPRESSION FUSION, CERVICAL 3-4, CERVICAL 4-5 WITH INSTRUMENTATION AND ALLOGRAFT   BACK SURGERY     x3   NECK SURGERY  12/01/2007   PORTA CATH INSERTION N/A 04/03/2021   Procedure: PORTA CATH INSERTION;  Surgeon: Algernon Huxley, MD;  Location: [facility];  Service: Cardiovascular;  Laterality: N/A;   VIDEO BRONCHOSCOPY WITH ENDOBRONCHIAL ULTRASOUND Bilateral 10/14/2022   Procedure: VIDEO BRONCHOSCOPY WITH ENDOBRONCHIAL  ULTRASOUND;  Surgeon: Tyler Pita, MD;  Location: [facility];  Service: Pulmonary;  Laterality: Bilateral;   Social History:  reports that he has been smoking cigars. He has never used smokeless tobacco. He reports current alcohol use. He reports that he does not use drugs.  Allergies  Allergen Reactions   Penicillins Anaphylaxis    Tolerates cefdinir, cephalexin and amoxicillin/clavulonate so true PCN allergy unlikely   Tetanus Toxoids Swelling   Lisinopril Cough   Losartan      Other reaction(s): Muscle Pain     Codeine Nausea Only and Nausea And Vomiting   Doxycycline Rash   Ruxience [Rituximab-Pvvr] Rash    05/06/21 pt w/ worsening rash during Ruxience infusion.  Face, neck and chest flushing redness    Family History  Problem Relation Age of Onset   Diabetes Mother    Heart attack Father    Emphysema Father    Diabetes Other     Prior to Admission medications   Medication Sig Start Date End Date Taking? Authorizing Provider  albuterol (VENTOLIN HFA) 108 (90 Base) MCG/ACT inhaler Inhale 2 puffs  into the lungs every 6 (six) hours as needed for wheezing or shortness of breath. 10/15/22   Tyler Pita, MD  allopurinol (ZYLOPRIM) 300 MG tablet TAKE 1 TABLET BY MOUTH ONCE DAILY Patient taking differently: Take 300 mg by mouth every evening. 09/04/21   Sindy Guadeloupe, MD  amLODipine (NORVASC) 5 MG tablet Take 1 tablet (5 mg total) by mouth daily. Patient taking differently: Take 5 mg by mouth every evening. 08/11/22   Kathrine Haddock, NP  atorvastatin (LIPITOR) 80 MG tablet Take 1 tablet (80 mg total) by mouth daily. Patient taking differently: Take 80 mg by mouth every evening. 06/26/22 06/21/23  Kate Sable, MD  buPROPion [facility] SR) 150 MG 12 hr tablet Take 150 mg by mouth 2 (two) times daily. 11/03/22   [provider]  buPROPion (WELLBUTRIN XL) 300 MG 24 hr tablet Take 1 tablet (300 mg total) by mouth daily. Patient taking differently: Take 300 mg by  mouth every evening. 04/07/22   Vigg, Avanti, MD  calcipotriene (DOVONOX) 0.005 % cream Apply topically 2 (two) times daily. Apply twice a day after fluorouracil to affected areas as directed in handout. Patient taking differently: Apply 1 Application topically daily. Apply twice a day after fluorouracil to affected areas as directed in handout. 05/27/22   Moye, [place], MD  cefdinir (OMNICEF) 300 MG capsule Take 1 capsule (300 mg total) by mouth 2 (two) times daily for 7 days. 11/27/22 12/04/22  Mecum, Erin E, PA-C  clonazePAM (KLONOPIN) 0.5 MG tablet Take 0.5 mg by mouth every evening.    Ander Slade, PA-C  DULoxetine (CYMBALTA) 60 MG capsule Take 60 mg by mouth every evening. 07/01/22   [provider]  finasteride (PROSCAR) 5 MG tablet Take 1 tablet (5 mg total) by mouth daily. Patient taking differently: Take 5 mg by mouth every evening. 02/25/22   Stoioff, Ronda Fairly, MD  fluorouracil (EFUDEX) 5 % cream Apply topically 2 (two) times daily. Apply twice a day to affected areas as directed in handout. Patient taking differently: Apply 1 Application topically daily. Apply twice a day to affected areas as directed in handout. 05/27/22   Moye, [place], MD  Fluticasone-Umeclidin-Vilant (TRELEGY ELLIPTA) 100-62.5-25 MCG/ACT AEPB Inhale 1 puff into the lungs daily. 10/20/22   Tyler Pita, MD  loratadine (CLARITIN) 10 MG tablet Take 10 mg by mouth every evening.    [provider]  metoprolol succinate (TOPROL-XL) 50 MG 24 hr tablet Take 2 tablets (100 mg total) by mouth daily. Take with or immediately following a meal. Patient taking differently: Take 100 mg by mouth every evening. Take with or immediately following a meal. 08/11/22 11/09/22  Kathrine Haddock, NP  mirtazapine (REMERON) 15 MG tablet Take 15 mg by mouth at bedtime. 06/30/22   [provider]  mupirocin ointment (BACTROBAN) 2 % Apply 1 Application topically daily. Apply to any open wounds until healed. 05/27/22   Moye,  [place], MD  naltrexone (DEPADE) 50 MG tablet Take 50 mg by mouth daily. 09/25/22   [provider]  nicotine (NICODERM CQ - DOSED IN MG/24 HOURS) 21 mg/24hr patch Place 1 patch (21 mg total) onto the skin daily. 11/10/22   Sindy Guadeloupe, MD  OLANZapine (ZYPREXA) 5 MG tablet Take 5 mg by mouth at bedtime. 01/28/22   [provider]  predniSONE (DELTASONE) 20 MG tablet Take 2 tablets (40 mg total) by mouth daily with breakfast for 5 days. 11/27/22 12/02/22  Mecum, Erin E, PA-C  sildenafil (REVATIO) 20  MG tablet TAKE 3-5 TABLETS BY MOUTH ONCE DAILY AS NEEDED FOR ERECTILE DYSFUNCTION Patient taking differently: Take 20 mg by mouth as needed. 07/29/21   Stoioff, Ronda Fairly, MD  tenofovir (VIREAD) 300 MG tablet Take 300 mg by mouth every evening.    [provider]  testosterone cypionate (DEPOTESTOSTERONE CYPIONATE) 200 MG/ML injection Inject 1 mL (200 mg total) into the muscle every 14 (fourteen) days. 12/19/21   Stoioff, Ronda Fairly, MD  traMADol (ULTRAM) 50 MG tablet Take 1 tablet by mouth every 6 (six) hours as needed.    [provider]    Physical Exam: Vitals:   11/30/22 1200 11/30/22 1300 11/30/22 1400 11/30/22 1500  BP: 130/76 125/79 (!) 143/85 (!) 142/95  Pulse: 100 96 95 95  Resp: '15 13 16 15  '$ Temp:    98.3 F (36.8 C)  TempSrc:    Oral  SpO2:      Weight:      Height:       Physical Exam Vitals and nursing note reviewed.  Constitutional:      Appearance: He is overweight.  HENT:     Head: Normocephalic and atraumatic.     Mouth/Throat:     Mouth: Mucous membranes are moist.     Pharynx: Oropharynx is clear.  Eyes:     General: No scleral icterus.    Conjunctiva/sclera: Conjunctivae normal.     Pupils: Pupils are equal, round, and reactive to light.  Cardiovascular:     Rate and Rhythm: Normal rate and regular rhythm.     Heart sounds: No murmur heard.    No gallop.  Pulmonary:     Effort: Pulmonary effort is normal. No respiratory distress.      Breath sounds: Normal breath sounds. No wheezing, rhonchi or rales.  Abdominal:     General: Bowel sounds are normal. There is no distension.     Palpations: Abdomen is soft.     Tenderness: There is no abdominal tenderness. There is no guarding.  Musculoskeletal:        General: Signs of injury (Right lower extremity wrapped with Ace wrap overlying a splint.) present.     Comments: Trace bilateral pitting edema  Skin:    General: Skin is warm and dry.  Neurological:     General: No focal deficit present.     Mental Status: He is alert and oriented to person, place, and time. Mental status is at baseline.     Comments: No tremors.  Psychiatric:        Mood and Affect: Mood normal.        Behavior: Behavior normal.        Thought Content: Thought content normal.        Judgment: Judgment normal.    Data Reviewed:  CBC with WBC of 13.2, hemoglobin of 14.9 and platelets of 175. BMP with sodium of 130, potassium of 4.0, chloride of 96, bicarb of 18, with anion gap of 16, glucose of 192, BUN of 15 and creatinine of 1.29 with GFR over 60. Lactic acid elevated at 5.9 Initial troponin negative at 6 Influenza, RSV and COVID-19 PCR negative  Echocardiogram obtained in 2021 with a EF of 60-65% with only grade 1 diastolic dysfunction.  EKG personally reviewed sinus rhythm with rate of 87.  No ST or T wave changes concerning for acute ischemia.  DG Ankle Complete Right  Result Date: 11/30/2022 CLINICAL DATA:  Splinting of the right ankle. EXAM: RIGHT ANKLE - COMPLETE  3+ VIEW COMPARISON:  Right ankle radiographs 11/30/2022 at 6:14 a.m. FINDINGS: Fiberglass splint is now in place posteriorly. Oblique fracture of the distal fibula and avulsion fracture of the medial malleolus are stable. Lightening of the tibiotalar space is stable. Posterior tibial fracture is better visualized on these images. IMPRESSION: 1. Interval placement of fiberglass splint. 2. Stable appearance of distal fibular and  medial malleolar fractures. 3. A posterior tibial fracture is also present, mildly displaced. Electronically Signed   By: San Morelle M.D.   On: 11/30/2022 13:17   DG Chest 2 View  Result Date: 11/30/2022 CLINICAL DATA:  62 year old male with history of cough. EXAM: CHEST - 2 VIEW COMPARISON:  Chest x-ray 11/01/2022. FINDINGS: Right internal jugular single-lumen Port-A-Cath with tip terminating in the proximal superior vena cava. Lung volumes are normal. Mild diffuse interstitial prominence and widespread peribronchial cuffing, stable compared to prior studies. No consolidative airspace disease. No pleural effusions. No pneumothorax. No pulmonary nodule or mass noted. Pulmonary vasculature and the cardiomediastinal silhouette are within normal limits. IMPRESSION: 1. Diffuse interstitial prominence and peribronchial cuffing, chronic and similar to prior studies, likely reflecting chronic bronchitis. No acute findings. Electronically Signed   By: Vinnie Langton M.D.   On: 11/30/2022 07:48   DG Ankle Complete Right  Result Date: 11/30/2022 CLINICAL DATA:  62 year old male status post fall. Pain and swelling. EXAM: RIGHT ANKLE - COMPLETE 3+ VIEW COMPARISON:  None Available. FINDINGS: Acute spiral fracture of the distal right fibula metadiaphysis. Associated avulsion fracture at the tip of the medial malleolus. And lateral dislocation of the right mortise joint by about 50%. Posterior malleolus and anterior to posterior alignment appear relatively maintained on the lateral view. No definite joint effusion. Calcaneus and talar dome intact. IMPRESSION: Acute right ankle lateral fracture-dislocation. - spiral fracture of the distal right fibula metadiaphysis. - small avulsion fracture of the medial malleolus. Electronically Signed   By: Genevie Ann M.D.   On: 11/30/2022 06:44    There are no new results to review at this time.   Family Communication: No family at bedside  Primary team communication: Will  update Orthopedic surgery  Thank you very much for involving [place] in the care of your patient.  Author: Jose Persia, MD 11/30/2022 3:59 PM  For on call review [email or web address].

## 2022-11-30 NOTE — H&P (Signed)
PREOPERATIVE H&P  Chief Complaint: Right ankle pain status post fall  HPI: Russell Simpson is a 62 y.o. male who presents to the [facility] ED today for complaint of severe right ankle pain after a fall at home in the setting of EtOH use overnight.  Patient states he injured his right ankle from a twisting injury when he slipped on laminate floor at home.  Pain is in the ED demonstrate a trimalleolar ankle fracture equivalent with increased medial clear space.  A closed reduction attempt was formed by Dr. Archie Balboa in the ER.  Postreduction films did not show significant improvement in the lateral translation of the ankle.  Orthopedics is consulted for management of his fracture.  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[place])    Arthritis    Asthma    Basal cell carcinoma 01/28/2021   R upper arm, [facility] 03/04/2021   Brain 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   Chronic pain    right arm   COPD (chronic obstructive pulmonary disease) ([place])    Depression    Dyspnea    Elevated liver enzymes    Environmental and seasonal allergies    Femur fracture, left ([place]) 1017   Follicular lymphoma ([place])    History of methicillin resistant staphylococcus aureus (MRSA)    years ago   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   left neck, Moh's 05/29/21   History of SCC (squamous cell carcinoma) of skin    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   left upper back ED&C done   Hypertension    Hyponatremia    Inappropriate sinus tachycardia    Pneumonia    SCC (squamous cell carcinoma) 08/28/2021   upper chest right of midline, EDC done 09/17/2021   SCC (squamous cell carcinoma) 09/30/2022   left chest  ED&C done   Sleep apnea    does not wear CPAP ; uses humidifier instead   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   right upper back, ED&C 06/18/2022   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   right upper  back ED&C done   Squamous cell carcinoma of skin 02/19/2021   L inferior mandible, treated with EDC   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/28/2021   R ant shoulder - ED&C   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/28/2021   L upper abdomen - ED&C   Past Surgical History:  Procedure Laterality Date   ANTERIOR CERVICAL DECOMP/DISCECTOMY FUSION N/A 06/17/2016   Procedure: ANTERIOR CERVICAL DECOMPRESSION FUSION, CERVICAL 3-4, CERVICAL 4-5 WITH INSTRUMENTATION AND ALLOGRAFT;  Surgeon: Phylliss Bob, MD;  Location: [place];  Service: Orthopedics;  Laterality: N/A;  ANTERIOR CERVICAL DECOMPRESSION FUSION, CERVICAL 3-4, CERVICAL 4-5 WITH INSTRUMENTATION AND ALLOGRAFT   BACK SURGERY     x3   NECK SURGERY  12/01/2007   PORTA CATH INSERTION N/A 04/03/2021   Procedure: PORTA CATH INSERTION;  Surgeon: Algernon Huxley, MD;  Location: [place] [facility];  Service: Cardiovascular;  Laterality: N/A;   VIDEO BRONCHOSCOPY WITH ENDOBRONCHIAL ULTRASOUND Bilateral 10/14/2022   Procedure: VIDEO BRONCHOSCOPY WITH ENDOBRONCHIAL ULTRASOUND;  Surgeon: Tyler Pita, MD;  Location: [facility];  Service: Pulmonary;  Laterality: Bilateral;   Social History   Socioeconomic History   Marital status: Divorced    Spouse name: Not on file   Number of children: Not on file   Years of education: Not on file  Highest education level: Not on file  Occupational History   Not on file  Tobacco Use   Smoking status: Every Day    Types: Cigars   Smokeless tobacco: Never   Tobacco comments:    8 cigars a day 09/17/2022- khj  Vaping Use   Vaping Use: Former   Start date: 04/30/2017   Quit date: 11/30/2017  Substance and Sexual Activity   Alcohol use: Yes    Comment: beers 6 a day   Drug use: No   Sexual activity: Not Currently  Other Topics Concern   Not on file  Social History Narrative   Not on file   Social Determinants of Health   Financial Resource Strain: High Risk (10/27/2021)   Overall Financial Resource Strain  (CARDIA)    Difficulty of Paying Living Expenses: Hard  Food Insecurity: No Food Insecurity (11/06/2022)   Hunger Vital Sign    Worried About Running Out of Food in the Last Year: Never true    Ran Out of Food in the Last Year: Never true  Transportation Needs: No Transportation Needs (11/06/2022)   PRAPARE - Hydrologist (Medical): No    Lack of Transportation (Non-Medical): No  Physical Activity: Inactive (10/27/2021)   Exercise Vital Sign    Days of Exercise per Week: 0 days    Minutes of Exercise per Session: 0 min  Stress: No Stress Concern Present (10/27/2021)   [place]    Feeling of Stress : Only a little  Social Connections: Socially Isolated (10/27/2021)   Social Connection and Isolation Panel [NHANES]    Frequency of Communication with Friends and Family: More than three times a week    Frequency of Social Gatherings with Friends and Family: More than three times a week    Attends Religious Services: Never    Marine scientist or Organizations: No    Attends Music therapist: Never    Marital Status: Divorced   Family History  Problem Relation Age of Onset   Diabetes Mother    Heart attack Father    Emphysema Father    Diabetes Other    Allergies  Allergen Reactions   Penicillins Anaphylaxis    Tolerates cefdinir, cephalexin and amoxicillin/clavulonate so true PCN allergy unlikely   Tetanus Toxoids Swelling   Lisinopril Cough   Losartan      Other reaction(s): Muscle Pain     Codeine Nausea Only and Nausea And Vomiting   Doxycycline Rash   Ruxience [Rituximab-Pvvr] Rash    05/06/21 pt w/ worsening rash during Ruxience infusion.  Face, neck and chest flushing redness   Prior to Admission medications   Medication Sig Start Date End Date Taking? Authorizing Provider  albuterol (VENTOLIN HFA) 108 (90 Base) MCG/ACT inhaler Inhale 2 puffs into the lungs  every 6 (six) hours as needed for wheezing or shortness of breath. 10/15/22   Tyler Pita, MD  allopurinol (ZYLOPRIM) 300 MG tablet TAKE 1 TABLET BY MOUTH ONCE DAILY Patient taking differently: Take 300 mg by mouth every evening. 09/04/21   Sindy Guadeloupe, MD  amLODipine (NORVASC) 5 MG tablet Take 1 tablet (5 mg total) by mouth daily. Patient taking differently: Take 5 mg by mouth every evening. 08/11/22   Kathrine Haddock, NP  atorvastatin (LIPITOR) 80 MG tablet Take 1 tablet (80 mg total) by mouth daily. Patient taking differently: Take 80 mg by mouth every evening. 06/26/22 06/21/23  Kate Sable, MD  buPROPion [facility] SR) 150 MG 12 hr tablet Take 150 mg by mouth 2 (two) times daily. 11/03/22   [provider]  buPROPion (WELLBUTRIN XL) 300 MG 24 hr tablet Take 1 tablet (300 mg total) by mouth daily. Patient taking differently: Take 300 mg by mouth every evening. 04/07/22   Vigg, Avanti, MD  calcipotriene (DOVONOX) 0.005 % cream Apply topically 2 (two) times daily. Apply twice a day after fluorouracil to affected areas as directed in handout. Patient taking differently: Apply 1 Application topically daily. Apply twice a day after fluorouracil to affected areas as directed in handout. 05/27/22   Moye, [place], MD  cefdinir (OMNICEF) 300 MG capsule Take 1 capsule (300 mg total) by mouth 2 (two) times daily for 7 days. 11/27/22 12/04/22  Mecum, Erin E, PA-C  clonazePAM (KLONOPIN) 0.5 MG tablet Take 0.5 mg by mouth every evening.    Ander Slade, PA-C  DULoxetine (CYMBALTA) 60 MG capsule Take 60 mg by mouth every evening. 07/01/22   [provider]  finasteride (PROSCAR) 5 MG tablet Take 1 tablet (5 mg total) by mouth daily. Patient taking differently: Take 5 mg by mouth every evening. 02/25/22   Stoioff, Ronda Fairly, MD  fluorouracil (EFUDEX) 5 % cream Apply topically 2 (two) times daily. Apply twice a day to affected areas as directed in handout. Patient taking differently:  Apply 1 Application topically daily. Apply twice a day to affected areas as directed in handout. 05/27/22   Moye, [place], MD  Fluticasone-Umeclidin-Vilant (TRELEGY ELLIPTA) 100-62.5-25 MCG/ACT AEPB Inhale 1 puff into the lungs daily. 10/20/22   Tyler Pita, MD  loratadine (CLARITIN) 10 MG tablet Take 10 mg by mouth every evening.    [provider]  metoprolol succinate (TOPROL-XL) 50 MG 24 hr tablet Take 2 tablets (100 mg total) by mouth daily. Take with or immediately following a meal. Patient taking differently: Take 100 mg by mouth every evening. Take with or immediately following a meal. 08/11/22 11/09/22  Kathrine Haddock, NP  mirtazapine (REMERON) 15 MG tablet Take 15 mg by mouth at bedtime. 06/30/22   [provider]  mupirocin ointment (BACTROBAN) 2 % Apply 1 Application topically daily. Apply to any open wounds until healed. 05/27/22   Moye, [place], MD  naltrexone (DEPADE) 50 MG tablet Take 50 mg by mouth daily. 09/25/22   [provider]  nicotine (NICODERM CQ - DOSED IN MG/24 HOURS) 21 mg/24hr patch Place 1 patch (21 mg total) onto the skin daily. 11/10/22   Sindy Guadeloupe, MD  OLANZapine (ZYPREXA) 5 MG tablet Take 5 mg by mouth at bedtime. 01/28/22   [provider]  predniSONE (DELTASONE) 20 MG tablet Take 2 tablets (40 mg total) by mouth daily with breakfast for 5 days. 11/27/22 12/02/22  Mecum, Erin E, PA-C  sildenafil (REVATIO) 20 MG tablet TAKE 3-5 TABLETS BY MOUTH ONCE DAILY AS NEEDED FOR ERECTILE DYSFUNCTION Patient taking differently: Take 20 mg by mouth as needed. 07/29/21   Stoioff, Ronda Fairly, MD  tenofovir (VIREAD) 300 MG tablet Take 300 mg by mouth every evening.    [provider]  testosterone cypionate (DEPOTESTOSTERONE CYPIONATE) 200 MG/ML injection Inject 1 mL (200 mg total) into the muscle every 14 (fourteen) days. 12/19/21   Stoioff, Ronda Fairly, MD  traMADol (ULTRAM) 50 MG tablet Take 1 tablet by mouth every 6 (six) hours as needed.     [provider]     Positive ROS: All other systems have  been reviewed and were otherwise negative with the exception of those mentioned in the HPI and as above.  Physical Exam: General: Alert, no acute distress Cardiovascular: Regular rate and rhythm, no murmurs rubs or gallops.  No pedal edema Respiratory: Clear to auscultation bilaterally, no wheezes rales or rhonchi. No cyanosis, no use of accessory musculature GI: No organomegaly, abdomen is soft and non-tender nondistended with positive bowel sounds. Skin: Skin intact, no lesions within the operative field. Neurologic: Sensation intact distally Psychiatric: Patient is competent for consent with normal mood and affect Lymphatic: No cervical lymphadenopathy  MUSCULOSKELETAL: Right ankle: An AO splint has been placed by the ED staff.  The patient has intact sensation to light touch in all 5 toes the first dorsal webspace.  His toes are well-perfused.  Can flex and extend his toes.  His compartments are soft compressible.  Assessment: Right trimalleolar ankle fracture  Plan: I personally reviewed the patient's x-rays.  I ordered additional views of his right tibia and fibula to ensure there was no proximal fracture as can be seen with [place] fractures.  There were no additional proximal fractures seen on these images.  I have discussed with the patient the nature of his fracture.  I recommended open reduction internal fixation.  The patient has an avulsion injury off the medial malleolus on his ankle films and I suspect there is soft tissue interposed in the medial gutter preventing close reduction.  The hospitalist service has been consulted for medical clearance.  Patient will be n.p.o. after midnight and should not receive any anticoagulation therapy in preparation for surgery tomorrow morning.  The patient understood and agreed with this plan.  Patient will be nonweightbearing on the right lower extremity should continue  strict elevation overnight.   Thornton Park, MD   11/30/2022 4:48 PM

## 2022-11-30 NOTE — Assessment & Plan Note (Signed)
Patient states he currently drinks 6-12 beers per day. No history of alcohol withdrawal  - CIWA without Ativan - Thiamine and folic acid - Counseling on importance of cessation. Patient is motivated.

## 2022-11-30 NOTE — Assessment & Plan Note (Signed)
Continue home antihypertensives 

## 2022-11-30 NOTE — Assessment & Plan Note (Signed)
Euvolemic at this time. Last echo in 2021 with preserved EF with G1DD.   - Monitor fluid status while receiving IVF

## 2022-11-30 NOTE — Assessment & Plan Note (Addendum)
Suspect a combination of alcoholic ketoacidosis and lactic acidosis.  Lactic acid is elevated at 5.9.  Low suspicion for underlying infection. No abdominal pain, nausea or vomiting to suggest pancreatitis.   - Aggressive IV fluid resuscitation - Hepatic function panel, magnesium, phosphorus, CK pending - Repeat lactic acid after IVF

## 2022-11-30 NOTE — ED Triage Notes (Signed)
FIRST NURSE NOTE:  Pt arrived via [facility] slipped out of bed, fell on ankle, + etoh use tonight 12 pack, obvious deformity  122/77 P80 100%RA

## 2022-11-30 NOTE — Assessment & Plan Note (Signed)
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   - Monitor BMP while admitted

## 2022-11-30 NOTE — Assessment & Plan Note (Signed)
In the setting of active alcohol use disorder.  LFT elevations are chronic in nature and no history of cirrhosis.  -Monitor LFTs while admitted with daily CMP

## 2022-11-30 NOTE — Assessment & Plan Note (Signed)
No wheezing on examination today.   - Continue home bronchodilators - Guaifenesin for chronic cough

## 2022-11-30 NOTE — ED Triage Notes (Addendum)
Pt arrived via [facility] states he was going to get in bed tonight and twisted the R ankle and fell, denies any head injury states he landed on the floor when he fell, pt states he drank a 12 pk tonight.   Pt alert and oriented at this time.   Pt also has abrasion noted to R wrist, pt has full ROM to right wrist denies any pain.

## 2022-11-30 NOTE — ED Provider Notes (Signed)
[facility] Provider Note    Event Date/Time   First MD Initiated Contact with Patient 11/30/22 1054     (approximate)   History   Ankle Pain and Fall   HPI  Russell Simpson is a 62 y.o. male  who presents to the emergency department today with primary complaint of right ankle pain. It is severe. The patient says he was going to bed when he fell. Twisted his right ankle. Also hit his right wrist, although not having any significant pain there.        Physical Exam   Triage Vital Signs: ED Triage Vitals  Enc Vitals Group     BP 11/30/22 0542 120/81     Pulse Rate 11/30/22 0542 96     Resp 11/30/22 0542 18     Temp 11/30/22 0542 97.6 F (36.4 C)     Temp Source 11/30/22 0542 Oral     SpO2 11/30/22 0542 95 %     Weight 11/30/22 0542 238 lb (108 kg)     Height 11/30/22 0542 '6\' 2"'$  (1.88 m)     Head Circumference --      Peak Flow --      Pain Score 11/30/22 0547 10     Pain Loc --      Pain Edu? --      Excl. in [place]? --     Most recent vital signs: Vitals:   11/30/22 0542  BP: 120/81  Pulse: 96  Resp: 18  Temp: 97.6 F (36.4 C)  SpO2: 95%   General: Awake, alert, oriented. CV:  Good peripheral perfusion. Regular rate and rhythm. Resp:  Normal effort. Lungs clear. Abd:  No distention.  Ext:  Deformity to right ankle. DP 2+. Able to move and sense in all toes.    ED Results / Procedures / Treatments   Labs (all labs ordered are listed, but only abnormal results are displayed) Labs Reviewed  BASIC METABOLIC PANEL - Abnormal; Notable for the following components:      Result Value   Sodium 130 (*)    Chloride 96 (*)    CO2 18 (*)    Glucose, Bld 192 (*)    Creatinine, Ser 1.29 (*)    Calcium 8.3 (*)    Anion gap 16 (*)    All other components within normal limits  CBC - Abnormal; Notable for the following components:   WBC 13.2 (*)    All other components within normal limits  LACTIC ACID, PLASMA - Abnormal; Notable for the  following components:   Lactic Acid, Venous 5.9 (*)    All other components within normal limits  RESP PANEL BY RT-PCR (RSV, FLU A&B, COVID)  [id number]  LACTIC ACID, PLASMA  TROPONIN I (HIGH SENSITIVITY)  TROPONIN I (HIGH SENSITIVITY)     RADIOLOGY I independently interpreted and visualized the CXR. My interpretation: No pneumonia Radiology interpretation:  IMPRESSION:  1. Diffuse interstitial prominence and peribronchial cuffing,  chronic and similar to prior studies, likely reflecting chronic  bronchitis. No acute findings.   I independently interpreted and visualized the right ankle. My interpretation: ankle fracture Radiology interpretation:  IMPRESSION:  Acute right ankle lateral fracture-dislocation.  - spiral fracture of the distal right fibula metadiaphysis.  - small avulsion fracture of the medial malleolus.      PROCEDURES:  Critical Care performed: No  Procedures   MEDICATIONS ORDERED IN ED: Medications - No data to display   IMPRESSION / MDM /  ASSESSMENT AND PLAN / ED COURSE  I reviewed the triage vital signs and the nursing notes.                              Differential diagnosis includes, but is not limited to, ankle fracture/dislocation, ankle sprain.  Patient's presentation is most consistent with acute presentation with potential threat to life or bodily function.  Patient presented to the emergency department today because of concern for right ankle pain after a fall. On exam there is deformity to the right ankle, however NV intact distally. Did attempt closed reduction in the ED without any significant improvement. Discussed with Dr. Mack Guise with orthopedic surgery who will plan on admission for open reduction/fixation.      FINAL CLINICAL IMPRESSION(S) / ED DIAGNOSES   Final diagnoses:  Closed fracture of right ankle, initial encounter     Note:  This document was prepared using Dragon voice recognition software and may include  unintentional dictation errors.Nance Pear, MD 12/01/22 [phone]

## 2022-12-01 ENCOUNTER — Inpatient Hospital Stay: Payer: Medicare HMO | Admitting: Anesthesiology

## 2022-12-01 ENCOUNTER — Encounter
Admission: EM | Disposition: A | Discharge status: Home / Self Care | Payer: Self-pay | Source: Home / Self Care | Attending: Orthopedic Surgery

## 2022-12-01 ENCOUNTER — Other Ambulatory Visit: Payer: Self-pay

## 2022-12-01 ENCOUNTER — Inpatient Hospital Stay: Payer: Medicare HMO

## 2022-12-01 ENCOUNTER — Encounter: Payer: Self-pay | Admitting: Orthopedic Surgery

## 2022-12-01 HISTORY — PX: ORIF ANKLE FRACTURE: SHX5408

## 2022-12-01 LAB — COMPREHENSIVE METABOLIC PANEL
ALT: 42 U/L (ref 0–44)
AST: 28 U/L (ref 15–41)
Albumin: 3.7 g/dL (ref 3.5–5.0)
Alkaline Phosphatase: 178 U/L — ABNORMAL HIGH (ref 38–126)
Anion gap: 10 (ref 5–15)
BUN: 10 mg/dL (ref 6–20)
CO2: 25 mmol/L (ref 22–32)
Calcium: 8.7 mg/dL — ABNORMAL LOW (ref 8.9–10.3)
Chloride: 100 mmol/L (ref 98–111)
Creatinine, Ser: 0.96 mg/dL (ref 0.61–1.24)
GFR, Estimated: >60 mL/min (ref >60)
Glucose, Bld: 161 mg/dL — ABNORMAL HIGH (ref 70–99)
Potassium: 4 mmol/L (ref 3.5–5.1)
Sodium: 135 mmol/L (ref 135–145)
Total Bilirubin: 1 mg/dL (ref 0.3–1.2)
Total Protein: 6.3 g/dL — ABNORMAL LOW (ref 6.5–8.1)

## 2022-12-01 LAB — CBC WITH DIFFERENTIAL/PLATELET
Abs Immature Granulocytes: 0.06 10*3/uL (ref 0.00–0.07)
Basophils Absolute: 0 10*3/uL (ref 0.0–0.1)
Basophils Relative: 0 %
Eosinophils Absolute: 0 10*3/uL (ref 0.0–0.5)
Eosinophils Relative: 1 %
HCT: 41.3 % (ref 39.0–52.0)
Hemoglobin: 14.1 g/dL (ref 13.0–17.0)
Immature Granulocytes: 1 %
Lymphocytes Relative: 32 %
Lymphs Abs: 1.9 10*3/uL (ref 0.7–4.0)
MCH: 31.6 pg (ref 26.0–34.0)
MCHC: 34.1 g/dL (ref 30.0–36.0)
MCV: 92.6 fL (ref 80.0–100.0)
Monocytes Absolute: 0.8 10*3/uL (ref 0.1–1.0)
Monocytes Relative: 13 %
Neutro Abs: 3.3 10*3/uL (ref 1.7–7.7)
Neutrophils Relative %: 53 %
Platelets: 134 10*3/uL — ABNORMAL LOW (ref 150–400)
RBC: 4.46 MIL/uL (ref 4.22–5.81)
RDW: 12.7 % (ref 11.5–15.5)
WBC: 6.1 10*3/uL (ref 4.0–10.5)
nRBC: 0 % (ref 0.0–0.2)

## 2022-12-01 LAB — MAGNESIUM: Magnesium: 1.9 mg/dL (ref 1.7–2.4)

## 2022-12-01 LAB — PHOSPHORUS: Phosphorus: 4 mg/dL (ref 2.5–4.6)

## 2022-12-01 LAB — TROPONIN I (HIGH SENSITIVITY): Troponin I (High Sensitivity): 9 ng/L (ref <18)

## 2022-12-01 SURGERY — OPEN REDUCTION INTERNAL FIXATION (ORIF) ANKLE FRACTURE
Anesthesia: Spinal | Site: Ankle | Laterality: Right

## 2022-12-01 MED ORDER — ACETAMINOPHEN 500 MG PO TABS
ORAL_TABLET | ORAL | Status: AC
  Filled 2022-12-01: qty 2

## 2022-12-01 MED ORDER — POLYETHYLENE GLYCOL 3350 17 G PO PACK
17.0000 g | PACK | Freq: Every day | ORAL | Status: DC | PRN
  Administered 2022-12-03: 17 g via ORAL
  Filled 2022-12-01: qty 1

## 2022-12-01 MED ORDER — CEFAZOLIN SODIUM-DEXTROSE 2-4 GM/100ML-% IV SOLN
INTRAVENOUS | Status: AC
  Filled 2022-12-01: qty 100

## 2022-12-01 MED ORDER — BUPIVACAINE HCL (PF) 0.25 % IJ SOLN
INTRAMUSCULAR | Status: AC
  Filled 2022-12-01: qty 30

## 2022-12-01 MED ORDER — CEFDINIR 300 MG PO CAPS
300.0000 mg | ORAL_CAPSULE | Freq: Two times a day (BID) | ORAL | Status: DC
  Administered 2022-12-01 – 2022-12-03 (×5): 300 mg via ORAL
  Filled 2022-12-01 (×5): qty 1

## 2022-12-01 MED ORDER — ACETAMINOPHEN 500 MG PO TABS
ORAL_TABLET | ORAL | Status: AC
  Filled 2022-12-01: qty 1

## 2022-12-01 MED ORDER — MUPIROCIN 2 % EX OINT
1.0000 | TOPICAL_OINTMENT | CUTANEOUS | Status: DC
  Filled 2022-12-01: qty 22

## 2022-12-01 MED ORDER — BISACODYL 10 MG RE SUPP: 10.0000 mg | Freq: Every day | RECTAL | Status: DC | PRN

## 2022-12-01 MED ORDER — SODIUM CHLORIDE 0.9 % IV SOLN: INTRAVENOUS | Status: DC

## 2022-12-01 MED ORDER — AMLODIPINE BESYLATE 5 MG PO TABS
ORAL_TABLET | ORAL | Status: AC
  Filled 2022-12-01: qty 1

## 2022-12-01 MED ORDER — ALUM & MAG HYDROXIDE-SIMETH 200-200-20 MG/5ML PO SUSP: 30.0000 mL | ORAL | Status: DC | PRN

## 2022-12-01 MED ORDER — METOPROLOL TARTRATE 5 MG/5ML IV SOLN
INTRAVENOUS | Status: DC | PRN
  Administered 2022-12-01: 3 mg via INTRAVENOUS
  Administered 2022-12-01: 2 mg via INTRAVENOUS

## 2022-12-01 MED ORDER — OXYCODONE HCL 5 MG PO TABS: 5.0000 mg | ORAL_TABLET | ORAL | Status: DC | PRN

## 2022-12-01 MED ORDER — MIDAZOLAM HCL 2 MG/2ML IJ SOLN
INTRAMUSCULAR | Status: AC
  Filled 2022-12-01: qty 2

## 2022-12-01 MED ORDER — DOCUSATE SODIUM 100 MG PO CAPS
ORAL_CAPSULE | ORAL | Status: AC
  Administered 2022-12-01: 100 mg via ORAL
  Filled 2022-12-01: qty 1

## 2022-12-01 MED ORDER — PHENOL 1.4 % MT LIQD: 1.0000 | OROMUCOSAL | Status: DC | PRN

## 2022-12-01 MED ORDER — ACETAMINOPHEN 500 MG PO TABS
1000.0000 mg | ORAL_TABLET | Freq: Four times a day (QID) | ORAL | Status: AC
  Administered 2022-12-01 – 2022-12-02 (×3): 1000 mg via ORAL

## 2022-12-01 MED ORDER — PHENYLEPHRINE HCL (PRESSORS) 10 MG/ML IV SOLN
INTRAVENOUS | Status: AC
  Filled 2022-12-01: qty 1

## 2022-12-01 MED ORDER — PHENYLEPHRINE HCL-NACL 20-0.9 MG/250ML-% IV SOLN
INTRAVENOUS | Status: DC | PRN
  Administered 2022-12-01: 30 ug/min via INTRAVENOUS
  Administered 2022-12-01: 50 ug/min via INTRAVENOUS

## 2022-12-01 MED ORDER — ATORVASTATIN CALCIUM 20 MG PO TABS
ORAL_TABLET | ORAL | Status: AC
  Administered 2022-12-01: 80 mg via ORAL
  Filled 2022-12-01: qty 4

## 2022-12-01 MED ORDER — FLUOROURACIL 5 % EX CREA: TOPICAL_CREAM | Freq: Two times a day (BID) | CUTANEOUS | Status: DC

## 2022-12-01 MED ORDER — OXYCODONE HCL 5 MG PO TABS
ORAL_TABLET | ORAL | Status: AC
  Filled 2022-12-01: qty 2

## 2022-12-01 MED ORDER — ATORVASTATIN CALCIUM 20 MG PO TABS: 80.0000 mg | ORAL_TABLET | Freq: Every day | ORAL | Status: DC

## 2022-12-01 MED ORDER — ACETAMINOPHEN 325 MG PO TABS: 325.0000 mg | ORAL_TABLET | Freq: Four times a day (QID) | ORAL | Status: DC | PRN

## 2022-12-01 MED ORDER — CEFAZOLIN SODIUM-DEXTROSE 2-4 GM/100ML-% IV SOLN: 2.0000 g | INTRAVENOUS | Status: DC

## 2022-12-01 MED ORDER — FENTANYL CITRATE (PF) 100 MCG/2ML IJ SOLN: 25.0000 ug | INTRAMUSCULAR | Status: DC | PRN

## 2022-12-01 MED ORDER — PROPOFOL 1000 MG/100ML IV EMUL
INTRAVENOUS | Status: AC
  Filled 2022-12-01: qty 100

## 2022-12-01 MED ORDER — PREDNISONE 10 MG PO TABS: 40.0000 mg | ORAL_TABLET | Freq: Every day | ORAL | Status: DC

## 2022-12-01 MED ORDER — ACETAMINOPHEN 500 MG PO TABS
ORAL_TABLET | ORAL | Status: AC
  Administered 2022-12-01: 1000 mg via ORAL
  Filled 2022-12-01: qty 2

## 2022-12-01 MED ORDER — HYDROMORPHONE HCL 1 MG/ML IJ SOLN
0.5000 mg | INTRAMUSCULAR | Status: DC | PRN
  Administered 2022-12-02: 0.5 mg via INTRAVENOUS

## 2022-12-01 MED ORDER — MIDAZOLAM HCL 5 MG/5ML IJ SOLN
INTRAMUSCULAR | Status: DC | PRN
  Administered 2022-12-01: 2 mg via INTRAVENOUS

## 2022-12-01 MED ORDER — DOCUSATE SODIUM 100 MG PO CAPS
100.0000 mg | ORAL_CAPSULE | Freq: Two times a day (BID) | ORAL | Status: DC
  Administered 2022-12-01: 100 mg via ORAL

## 2022-12-01 MED ORDER — ONDANSETRON HCL 4 MG/2ML IJ SOLN: 4.0000 mg | Freq: Four times a day (QID) | INTRAMUSCULAR | Status: DC | PRN

## 2022-12-01 MED ORDER — BUPIVACAINE HCL (PF) 0.5 % IJ SOLN
INTRAMUSCULAR | Status: DC | PRN
  Administered 2022-12-01: 3 mL

## 2022-12-01 MED ORDER — DOCUSATE SODIUM 100 MG PO CAPS
ORAL_CAPSULE | ORAL | Status: AC
  Filled 2022-12-01: qty 1

## 2022-12-01 MED ORDER — CEFAZOLIN SODIUM-DEXTROSE 2-4 GM/100ML-% IV SOLN
INTRAVENOUS | Status: AC
  Administered 2022-12-01: 2 g via INTRAVENOUS
  Filled 2022-12-01: qty 100

## 2022-12-01 MED ORDER — ONDANSETRON HCL 4 MG/2ML IJ SOLN: 4.0000 mg | Freq: Once | INTRAMUSCULAR | Status: DC | PRN

## 2022-12-01 MED ORDER — ONDANSETRON HCL 4 MG PO TABS: 4.0000 mg | ORAL_TABLET | Freq: Four times a day (QID) | ORAL | Status: DC | PRN

## 2022-12-01 MED ORDER — ENOXAPARIN SODIUM 40 MG/0.4ML IJ SOSY
40.0000 mg | PREFILLED_SYRINGE | INTRAMUSCULAR | Status: DC
  Filled 2022-12-01: qty 0.4

## 2022-12-01 MED ORDER — OXYCODONE HCL 5 MG PO TABS
ORAL_TABLET | ORAL | Status: AC
  Administered 2022-12-01: 10 mg via ORAL
  Filled 2022-12-01: qty 2

## 2022-12-01 MED ORDER — NICOTINE 21 MG/24HR TD PT24
21.0000 mg | MEDICATED_PATCH | Freq: Every day | TRANSDERMAL | Status: DC
  Administered 2022-12-01 – 2022-12-02 (×2): 21 mg via TRANSDERMAL
  Filled 2022-12-01 (×3): qty 1

## 2022-12-01 MED ORDER — ORAL CARE MOUTH RINSE: 15.0000 mL | OROMUCOSAL | Status: DC | PRN

## 2022-12-01 MED ORDER — FENTANYL CITRATE (PF) 100 MCG/2ML IJ SOLN
INTRAMUSCULAR | Status: AC
  Filled 2022-12-01: qty 2

## 2022-12-01 MED ORDER — FAMOTIDINE 20 MG PO TABS
ORAL_TABLET | ORAL | Status: AC
  Filled 2022-12-01: qty 1

## 2022-12-01 MED ORDER — FINASTERIDE 5 MG PO TABS
ORAL_TABLET | ORAL | Status: AC
  Filled 2022-12-01: qty 1

## 2022-12-01 MED ORDER — 0.9 % SODIUM CHLORIDE (POUR BTL) OPTIME
TOPICAL | Status: DC | PRN
  Administered 2022-12-01: 1000 mL

## 2022-12-01 MED ORDER — BUPIVACAINE HCL 0.25 % IJ SOLN
INTRAMUSCULAR | Status: DC | PRN
  Administered 2022-12-01: 30 mL

## 2022-12-01 MED ORDER — FENTANYL CITRATE (PF) 100 MCG/2ML IJ SOLN
INTRAMUSCULAR | Status: DC | PRN
  Administered 2022-12-01 (×2): 50 ug via INTRAVENOUS

## 2022-12-01 MED ORDER — MENTHOL 3 MG MT LOZG: 1.0000 | LOZENGE | OROMUCOSAL | Status: DC | PRN

## 2022-12-01 MED ORDER — PROPOFOL 10 MG/ML IV BOLUS
INTRAVENOUS | Status: AC
  Filled 2022-12-01: qty 20

## 2022-12-01 MED ORDER — NEOMYCIN-POLYMYXIN B GU 40-200000 IR SOLN
Status: AC
  Filled 2022-12-01: qty 20

## 2022-12-01 MED ORDER — OXYCODONE HCL 5 MG PO TABS
10.0000 mg | ORAL_TABLET | ORAL | Status: DC | PRN
  Administered 2022-12-01 – 2022-12-02 (×4): 10 mg via ORAL

## 2022-12-01 MED ORDER — PREDNISONE 10 MG PO TABS
40.0000 mg | ORAL_TABLET | Freq: Every day | ORAL | Status: AC
  Administered 2022-12-01: 40 mg via ORAL
  Filled 2022-12-01: qty 4

## 2022-12-01 MED ORDER — PROPOFOL 500 MG/50ML IV EMUL
INTRAVENOUS | Status: DC | PRN
  Administered 2022-12-01: 150 ug/kg/min via INTRAVENOUS

## 2022-12-01 MED ORDER — PHENYLEPHRINE HCL-NACL 20-0.9 MG/250ML-% IV SOLN
INTRAVENOUS | Status: AC
  Filled 2022-12-01: qty 250

## 2022-12-01 MED ORDER — GLYCOPYRROLATE 0.2 MG/ML IJ SOLN
INTRAMUSCULAR | Status: DC | PRN
  Administered 2022-12-01: .2 mg via INTRAVENOUS

## 2022-12-01 MED ORDER — CEFAZOLIN SODIUM-DEXTROSE 2-4 GM/100ML-% IV SOLN: 2.0000 g | Freq: Four times a day (QID) | INTRAVENOUS | Status: AC

## 2022-12-01 SURGICAL SUPPLY — 56 items
ANCH SUT NDL DX FBRTK STRL LF (Anchor) ×3 IMPLANT
ANCHOR SUT FBRTK 1.3 SUTTAP (Anchor) IMPLANT
BIT DRILL 2.5X110 QC LCP DISP (BIT) IMPLANT
BLADE SURG 15 STRL LF DISP TIS (BLADE) ×2 IMPLANT
BLADE SURG 15 STRL SS (BLADE) ×1
BNDG CMPR 5X4 CHSV STRCH STRL (GAUZE/BANDAGES/DRESSINGS) ×1
BNDG CMPR STD VLCR NS LF 5.8X4 (GAUZE/BANDAGES/DRESSINGS) ×2
BNDG COHESIVE 4X5 TAN STRL LF (GAUZE/BANDAGES/DRESSINGS) ×2 IMPLANT
BNDG ELASTIC 4X5.8 VLCR NS LF (GAUZE/BANDAGES/DRESSINGS) ×4 IMPLANT
BNDG ESMARK 6X12 TAN STRL LF (GAUZE/BANDAGES/DRESSINGS) ×2 IMPLANT
CUFF TOURN SGL QUICK 24 (TOURNIQUET CUFF)
CUFF TOURN SGL QUICK 34 (TOURNIQUET CUFF)
CUFF TRNQT CYL 24X4X16.5-23 (TOURNIQUET CUFF) IMPLANT
CUFF TRNQT CYL 34X4.125X (TOURNIQUET CUFF) IMPLANT
DRAPE FLUOR MINI C-ARM 54X84 (DRAPES) ×2 IMPLANT
DRAPE INCISE IOBAN 66X45 STRL (DRAPES) ×2 IMPLANT
DRAPE U-SHAPE 47X51 STRL (DRAPES) ×2 IMPLANT
DURAPREP 26ML APPLICATOR (WOUND CARE) ×4 IMPLANT
ELECT REM PT RETURN 9FT ADLT (ELECTROSURGICAL) ×1
ELECTRODE REM PT RTRN 9FT ADLT (ELECTROSURGICAL) ×2 IMPLANT
GAUZE SPONGE 4X4 12PLY STRL (GAUZE/BANDAGES/DRESSINGS) ×2 IMPLANT
GAUZE XEROFORM 1X8 LF (GAUZE/BANDAGES/DRESSINGS) ×2 IMPLANT
GLOVE BIOGEL PI IND STRL 9 (GLOVE) ×2 IMPLANT
GLOVE BIOGEL PI ORTHO SZ9 (GLOVE) ×8 IMPLANT
GOWN STRL REUS TWL 2XL XL LVL4 (GOWN DISPOSABLE) ×2 IMPLANT
GOWN STRL REUS W/ TWL LRG LVL3 (GOWN DISPOSABLE) ×2 IMPLANT
GOWN STRL REUS W/TWL LRG LVL3 (GOWN DISPOSABLE) ×1
KIT FIBERTAK DX 1.6 DISP (KITS) IMPLANT
KIT TURNOVER KIT A (KITS) ×2 IMPLANT
LABEL OR SOLS (LABEL) ×2 IMPLANT
MANIFOLD NEPTUNE II (INSTRUMENTS) ×2 IMPLANT
NS IRRIG 1000ML POUR BTL (IV SOLUTION) ×2 IMPLANT
PACK EXTREMITY ARMC (MISCELLANEOUS) ×2 IMPLANT
PAD ABD DERMACEA PRESS 5X9 (GAUZE/BANDAGES/DRESSINGS) ×4 IMPLANT
PAD CAST 4YDX4 CTTN HI CHSV (CAST SUPPLIES) ×6 IMPLANT
PADDING CAST COTTON 4X4 STRL (CAST SUPPLIES) ×3
PLATE LCP 3.5 1/3 TUB 10HX117 (Plate) IMPLANT
SCREW CANC FT 4.0X20 (Screw) IMPLANT
SCREW CANC FT 4.0X22 (Screw) IMPLANT
SCREW CANC FT ST SFS 4X16 (Screw) IMPLANT
SCREW CANC FT/18 4.0 (Screw) IMPLANT
SCREW LOCK CORT ST 3.5X14 (Screw) IMPLANT
SCREW LOCK CORT ST 3.5X16 (Screw) IMPLANT
SCREW LOCK CORT ST 3.5X20 (Screw) IMPLANT
SCREW LOCK CORT ST 3.5X26 (Screw) IMPLANT
SPLINT CAST 1 STEP 4X30 (MISCELLANEOUS) ×4 IMPLANT
SPONGE T-LAP 18X18 ~~LOC~~+RFID (SPONGE) ×2 IMPLANT
STAPLER SKIN PROX 35W (STAPLE) ×2 IMPLANT
STOCKINETTE STRL 6IN 960660 (GAUZE/BANDAGES/DRESSINGS) ×2 IMPLANT
STRIP CLOSURE SKIN 1/2X4 (GAUZE/BANDAGES/DRESSINGS) ×4 IMPLANT
SUT VIC AB 2-0 SH 27 (SUTURE) ×2
SUT VIC AB 2-0 SH 27XBRD (SUTURE) ×4 IMPLANT
SYR 30ML LL (SYRINGE) ×2 IMPLANT
TAPE PAPER 2X10 WHT MICROPORE (GAUZE/BANDAGES/DRESSINGS) ×2 IMPLANT
TRAP FLUID SMOKE EVACUATOR (MISCELLANEOUS) ×2 IMPLANT
WATER STERILE IRR 500ML POUR (IV SOLUTION) ×2 IMPLANT

## 2022-12-01 NOTE — Op Note (Signed)
12/01/2022  2:15 PM  PATIENT:  Russell Simpson    PRE-OPERATIVE DIAGNOSIS:  RIGHT ANKLE TRIMALLEOLAR FRACTURE EQUIVALENT  POST-OPERATIVE DIAGNOSIS:  Same  PROCEDURE:  OPEN REDUCTION INTERNAL FIXATION (ORIF) RIGHT ANKLE FRACTURE  SURGEON:  Thornton Park, MD  ANESTHESIA:   General  PREOPERATIVE INDICATIONS:  Russell Simpson is a  62 y.o. male with a diagnosis of RIGHT TRIMALLEOLAR ANKLE FRACTURE EQUIVALENT sustained after a fall at home.  Patient presented with a right ankle fracture dislocation to the ER.  A closed reduction attempt was performed for persistent medial clear space widening noted on postreduction films.  Patient was the admitted to the hospital on the orthopedic service and cleared for surgery by the hospitalist service.  I recommended open reduction internal fixation for the patient's fracture.  X-rays demonstrated a lateral malleolus fracture and posterior malleolar fractures.  Patient had an avulsion fracture of the medial malleolus suspicious for significant medial ligamentous injury.  I discussed the risks and benefits of surgery. The risks include but are not limited to infection, bleeding requiring blood transfusion, nerve or blood vessel injury, joint stiffness or loss of motion, persistent pain, weakness or instability, malunion, nonunion and hardware failure and the need for further surgery. Medical risks include but are not limited to DVT and pulmonary embolism, myocardial infarction, stroke, pneumonia, respiratory failure and death. Patient understood these risks and wished to proceed.   OPERATIVE IMPLANTS: Synthes 10 hole one third tubular plate for lateral fixation.  Arthrex FiberTak Anchors x 3 for medial deltoid ligament and anterolateral capsular repair.    OPERATIVE FINDINGS: Patient had a comminuted fracture of the lateral malleolus, with extensive anteromedial capsular stripping off the distal anteromedial tibia as well as avulsion of the deltoid ligament from the  tip of the medial malleolus.  The posterior malleolar fragment was observed to be nondisplaced after fixation of the lateral malleolus and medial soft tissue and did not require fixation.  OPERATIVE PROCEDURE: Patient was met in the preoperative area. The right leg was signed my initials and the word yes according the hospital's correct site of surgery protocol. He was brought to the operating room where he underwent cement of a spinal anesthetic by the anesthesia service given his history of COPD. The patient was placed supine on the operative table. A bump was placed under the right hip. A tourniquet was applied to the right thigh.  The lower extremity was prepped and draped in a sterile fashion. A timeout was performed to verify the patient's name, date of birth, medical record number, correct site of surgery and correct procedure to be performed. It was also used to verify the patient received antibiotics, and that all appropriate instruments, implants and radiographic studies were available in the room. Once all in attendance were in agreement, the case began.  The right lower extremity was exsanguinated with an Esmarch. The tourniquet was inflated to 275 mmHg. This was applied for a total of 125 minutes.  Attention was turned initially to the medial ankle.  A vertical incision was made over the medial malleolus.  The subcutaneous tissue was dissected with a Metzenbaum scissor and pickup.  The medial malleolus was identified.  The anterior and lateral capsule and deltoid ligament had avulsed off the distal anteromedial tibia and medial malleolus.  A small avulsion fragment, noted on x-ray, was identified and removed.  The decision was made to repair the medial ligamentous structures.  Three Arthrex Fibertak anchors were inserted into the anterior medial tibia and medial malleolus.  The sutures from these anchors were then used to repair the anterior medial capsule and deltoid ligament.  A lateral incision  was then made over the fibula. The subcutaneous tissues were dissected with the Metzenbaum scissor and pickup. Care was taken to avoid injury to the superficial peroneal nerve. The lateral malleolus fracture was identified and irrigated and fracture hematoma was removed. Soft tissue was removed from the fracture site using a periosteal elevator. A fracture reduction clamp was then used to reduce the comminuted fracture to an anatomic position.     The lateral malleolus was then drilled in an AP direction, perpendicular to the fracture site to allow for placement of the lag screw.   Two lag screw, 20 and 16 mm in length, were advanced across the fracture site by hand. This compressed the fracture.   A 10 hole, 1/3 tubular plate was then contoured and placed along the lateral fibula. Four bicortical screws were placed proximal to the fracture and four fully threaded cancellus screws were placed distal the fracture. The fracture reduction and hardware placement were confirmed on AP and lateral imaging.  The posterior malleolus was then examined under fluoroscopy. It was felt to be less than 20% of the articular surface and was in a near anatomic position. The decision was made made not to place an AP screw given its stability and small size.  A stress test of the right ankle was then performed under fluoroscopy.  This test did not reveal any syndesmotic injury or opening of the medial clear space.  The medial and lateral incisions were then copiously irrigated. The subcutaneous tissue was closed with 2-0 Vicryl and the skin approximated staples. A dry sterile dressing was applied along with an AO splint. The patient's ankle was positioned in neutral. The patient was then awoken from anesthesia, transferred to hospital bed and brought to the PACU in stable condition. I was scrubbed and present the entire case and all sharp, sponge and instrument counts were correct at conclusion the case.    Russell Gaul,  MD

## 2022-12-01 NOTE — Progress Notes (Signed)
Subjective:  POST OP CHECK s/p right ankle ORIF.   Patient reports right ankle pain as mild to moderate.  Foley catheter was aggravating the patient and has been removed.  Objective:   VITALS:   Vitals:   12/01/22 1200 12/01/22 1215 12/01/22 1230 12/01/22 1415  BP: 115/81 122/89 131/87 (!) 142/85  Pulse: 98 95 (!) 101 99  Resp: '16 16 16 18  '$ Temp:    98 F (36.7 C)  TempSrc:    Oral  SpO2: 97% 97% 95% 95%  Weight:      Height:        PHYSICAL EXAM: Right lower extremity: AO dressing is in place.  It is clean and dry. Patient's toes are well-perfused and he has intact sensation light touch can flex and extend his toes.   LABS  Results for orders placed or performed during the hospital encounter of 11/30/22 (from the past 24 hour(s))  Urinalysis, Routine w reflex microscopic     Status: Abnormal   Collection Time: 11/30/22  3:22 PM  Result Value Ref Range   Color, Urine STRAW (A) YELLOW   APPearance CLEAR (A) CLEAR   Specific Gravity, Urine 1.005 1.005 - 1.030   pH 5.0 5.0 - 8.0   Glucose, UA NEGATIVE NEGATIVE mg/dL   Hgb urine dipstick NEGATIVE NEGATIVE   Bilirubin Urine NEGATIVE NEGATIVE   Ketones, ur NEGATIVE NEGATIVE mg/dL   Protein, ur NEGATIVE NEGATIVE mg/dL   Nitrite NEGATIVE NEGATIVE   Leukocytes,Ua NEGATIVE NEGATIVE  APTT     Status: None   Collection Time: 11/30/22  3:50 PM  Result Value Ref Range   aPTT 24 24 - 36 seconds  Protime-INR     Status: None   Collection Time: 11/30/22  3:50 PM  Result Value Ref Range   Prothrombin Time 13.3 11.4 - 15.2 seconds   INR 1.0 0.8 - 1.2  Troponin I (High Sensitivity)     Status: None   Collection Time: 12/01/22  6:17 AM  Result Value Ref Range   Troponin I (High Sensitivity) 9 <18 ng/L  Comprehensive metabolic panel     Status: Abnormal   Collection Time: 12/01/22  6:17 AM  Result Value Ref Range   Sodium 135 135 - 145 mmol/L   Potassium 4.0 3.5 - 5.1 mmol/L   Chloride 100 98 - 111 mmol/L   CO2 25 22 - 32  mmol/L   Glucose, Bld 161 (H) 70 - 99 mg/dL   BUN 10 6 - 20 mg/dL   Creatinine, Ser 0.96 0.61 - 1.24 mg/dL   Calcium 8.7 (L) 8.9 - 10.3 mg/dL   Total Protein 6.3 (L) 6.5 - 8.1 g/dL   Albumin 3.7 3.5 - 5.0 g/dL   AST 28 15 - 41 U/L   ALT 42 0 - 44 U/L   Alkaline Phosphatase 178 (H) 38 - 126 U/L   Total Bilirubin 1.0 0.3 - 1.2 mg/dL   GFR, Estimated >60 >60 mL/min   Anion gap 10 5 - 15  CBC with Differential/Platelet     Status: Abnormal   Collection Time: 12/01/22  6:17 AM  Result Value Ref Range   WBC 6.1 4.0 - 10.5 K/uL   RBC 4.46 4.22 - 5.81 MIL/uL   Hemoglobin 14.1 13.0 - 17.0 g/dL   HCT 41.3 39.0 - 52.0 %   MCV 92.6 80.0 - 100.0 fL   MCH 31.6 26.0 - 34.0 pg   MCHC 34.1 30.0 - 36.0 g/dL   RDW 12.7 11.5 -  15.5 %   Platelets 134 (L) 150 - 400 K/uL   nRBC 0.0 0.0 - 0.2 %   Neutrophils Relative % 53 %   Neutro Abs 3.3 1.7 - 7.7 K/uL   Lymphocytes Relative 32 %   Lymphs Abs 1.9 0.7 - 4.0 K/uL   Monocytes Relative 13 %   Monocytes Absolute 0.8 0.1 - 1.0 K/uL   Eosinophils Relative 1 %   Eosinophils Absolute 0.0 0.0 - 0.5 K/uL   Basophils Relative 0 %   Basophils Absolute 0.0 0.0 - 0.1 K/uL   Immature Granulocytes 1 %   Abs Immature Granulocytes 0.06 0.00 - 0.07 K/uL  Magnesium     Status: None   Collection Time: 12/01/22  6:17 AM  Result Value Ref Range   Magnesium 1.9 1.7 - 2.4 mg/dL  Phosphorus     Status: None   Collection Time: 12/01/22  6:17 AM  Result Value Ref Range   Phosphorus 4.0 2.5 - 4.6 mg/dL    DG Ankle Right Port  Result Date: 12/01/2022 CLINICAL DATA:  ORIF fibular fracture EXAM: PORTABLE RIGHT ANKLE - 3 VIEW COMPARISON:  11/30/2022 FINDINGS: Overlying cast obscures some detail. Internal plate and screw fixation of the distal fibula is noted, traversing a distal fibular fracture which is in near anatomic alignment and position. Tibial fractures are difficult to visualized on this study. Mild widening of the MEDIAL tibiotalar joint is again noted.  IMPRESSION: ORIF distal fibular fracture. Electronically Signed   By: Margarette Canada M.D.   On: 12/01/2022 12:39   DG MINI C-ARM IMAGE ONLY  Result Date: 12/01/2022 There is no interpretation for this exam.  This order is for images obtained during a surgical procedure.  Please See "Surgeries" Tab for more information regarding the procedure.   DG Tibia/Fibula Right  Result Date: 11/30/2022 CLINICAL DATA:  Ankle fracture. EXAM: RIGHT TIBIA AND FIBULA - 2 VIEW COMPARISON:  Right ankle radiographs 11/30/2022 (multiple studies) FINDINGS: Normal bone mineralization. Overlying splint again limits evaluation of fine bony detail the distal tibia, fibula, and ankle. Redemonstration of oblique fracture of the distal fibular diaphysis and metaphysis with unchanged mild posterior displacement of the distal fracture component. There is slightly improved but persistent approximately 10 mm widening of the medial clear space. There is narrowing of the lateral tibiotalar joint space on frontal view. A small ossicle is again seen just medial to the talus, presumably avulsion from the distal medial malleolus where there is a similarly sized donor site. Mildly moderately displaced posterior malleolar fracture is unchanged. Minimal superior patellar degenerative osteophytosis. No additional acute fracture is seen within the more proximal tibia or fibula. IMPRESSION: Redemonstration of distal fibula, posterior malleolar, and distal medial malleolar fractures with persistent widening of the medial tibiotalar clear space. No additional more proximal acute fracture is seen. Electronically Signed   By: Yvonne Kendall M.D.   On: 11/30/2022 16:23   DG Ankle Complete Right  Result Date: 11/30/2022 CLINICAL DATA:  Splinting of the right ankle. EXAM: RIGHT ANKLE - COMPLETE 3+ VIEW COMPARISON:  Right ankle radiographs 11/30/2022 at 6:14 a.m. FINDINGS: Fiberglass splint is now in place posteriorly. Oblique fracture of the distal fibula and  avulsion fracture of the medial malleolus are stable. Lightening of the tibiotalar space is stable. Posterior tibial fracture is better visualized on these images. IMPRESSION: 1. Interval placement of fiberglass splint. 2. Stable appearance of distal fibular and medial malleolar fractures. 3. A posterior tibial fracture is also present, mildly displaced. Electronically Signed  By: San Morelle M.D.   On: 11/30/2022 13:17   DG Chest 2 View  Result Date: 11/30/2022 CLINICAL DATA:  62 year old male with history of cough. EXAM: CHEST - 2 VIEW COMPARISON:  Chest x-ray 11/01/2022. FINDINGS: Right internal jugular single-lumen Port-A-Cath with tip terminating in the proximal superior vena cava. Lung volumes are normal. Mild diffuse interstitial prominence and widespread peribronchial cuffing, stable compared to prior studies. No consolidative airspace disease. No pleural effusions. No pneumothorax. No pulmonary nodule or mass noted. Pulmonary vasculature and the cardiomediastinal silhouette are within normal limits. IMPRESSION: 1. Diffuse interstitial prominence and peribronchial cuffing, chronic and similar to prior studies, likely reflecting chronic bronchitis. No acute findings. Electronically Signed   By: Vinnie Langton M.D.   On: 11/30/2022 07:48   DG Ankle Complete Right  Result Date: 11/30/2022 CLINICAL DATA:  62 year old male status post fall. Pain and swelling. EXAM: RIGHT ANKLE - COMPLETE 3+ VIEW COMPARISON:  None Available. FINDINGS: Acute spiral fracture of the distal right fibula metadiaphysis. Associated avulsion fracture at the tip of the medial malleolus. And lateral dislocation of the right mortise joint by about 50%. Posterior malleolus and anterior to posterior alignment appear relatively maintained on the lateral view. No definite joint effusion. Calcaneus and talar dome intact. IMPRESSION: Acute right ankle lateral fracture-dislocation. - spiral fracture of the distal right fibula  metadiaphysis. - small avulsion fracture of the medial malleolus. Electronically Signed   By: Genevie Ann M.D.   On: 11/30/2022 06:44    Assessment/Plan: Day of Surgery   Principal Problem:   Closed right ankle fracture Active Problems:   Alcohol use disorder, severe, dependence (HCC)   Chronic diastolic CHF (congestive heart failure) (HCC)   Hyponatremia   Abnormal LFTs   HTN (hypertension)   High anion gap metabolic acidosis   Ankle fracture   COPD (chronic obstructive pulmonary disease) (HCC)  Reviewed the patient's postoperative x-rays.  There is persistent medial clear space widening.  This was not evident on FluoroScan imaging following medial soft tissue repair and fixation of the lateral malleolus.  I explained these findings to the patient.  I am recommending that we return to the OR tomorrow to place a tight rope for fixation of a syndesmotic disruption.  Patient understood and agreed with this plan.    Thornton Park , MD 12/01/2022, 3:03 PM

## 2022-12-01 NOTE — Progress Notes (Signed)
OT Cancellation Note  Patient Details Name: [name] MRN: [id number] DOB: 1961/10/12   Cancelled Treatment:    Reason Eval/Treat Not Completed: Patient not medically ready.OT order received and chart reviewed. Per chart review, Pt to return to OR 1/3.  Will sign off at this time and wait for new orders.  Darleen Crocker, MS, OTR/L , CBIS ascom [phone]  12/01/22, 3:39 PM

## 2022-12-01 NOTE — Progress Notes (Signed)
Notified Dr. Mack Guise that the foley is aggravating the patient and asking for it to be removed.  I also asked if the patient's prednisone could be started today instead of tomorrow.  He gave me orders to switch the prednisone order and that as long as the spinal had fully worn off we could remove the foley. I acknowledged.

## 2022-12-01 NOTE — Transfer of Care (Signed)
Immediate Anesthesia Transfer of Care Note  Patient: Russell Simpson  Procedure(s) Performed: OPEN REDUCTION INTERNAL FIXATION (ORIF) ANKLE FRACTURE (Right: Ankle)  Patient Location: PACU  Anesthesia Type:Spinal  Level of Consciousness: awake, alert , and oriented  Airway & Oxygen Therapy: Patient Spontanous Breathing and Patient connected to face mask oxygen  Post-op Assessment: Report given to RN and Post -op Vital signs reviewed and stable  Post vital signs: Reviewed and stable  Last Vitals:  Vitals Value Taken Time  BP 116/98 12/01/22 1046  Temp    Pulse 85 12/01/22 1052  Resp 12 12/01/22 1052  SpO2 91 % 12/01/22 1052  Vitals shown include unvalidated device data.  Last Pain:  Vitals:   11/30/22 1856  TempSrc:   PainSc: 9          Complications: No notable events documented.

## 2022-12-01 NOTE — Anesthesia Preprocedure Evaluation (Signed)
Anesthesia Evaluation  Patient identified by MRN, date of birth, ID band Patient awake    Reviewed: Allergy & Precautions, NPO status , Patient's Chart, lab work & pertinent test results  Airway Mallampati: III  TM Distance: >3 FB Neck ROM: full    Dental  (+) Teeth Intact   Pulmonary neg pulmonary ROS, shortness of breath, sleep apnea , COPD, Current Smoker and Patient abstained from smoking.   Pulmonary exam normal  + decreased breath sounds      Cardiovascular Exercise Tolerance: Poor hypertension, Pt. on medications +CHF  negative cardio ROS Normal cardiovascular exam Rhythm:Regular     Neuro/Psych   Anxiety  Bipolar Disorder   negative neurological ROS  negative psych ROS   GI/Hepatic negative GI ROS, Neg liver ROS,,,  Endo/Other  negative endocrine ROS  Morbid obesity  Renal/GU negative Renal ROS     Musculoskeletal   Abdominal  (+) + obese  Peds negative pediatric ROS (+)  Hematology negative hematology ROS (+)   Anesthesia Other Findings Past Medical History: No date: 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HCC) No date: Arthritis No date: Asthma 01/28/2021: Basal cell carcinoma     Comment:  R upper arm, EDC 03/04/2021 No date: Brain tumor (benign) (HCC)     Comment:  benign pituitary neoplasm No date: Chronic pain     Comment:  right arm No date: COPD (chronic obstructive pulmonary disease) (HCC) No date: Depression No date: Dyspnea No date: Elevated liver enzymes No date: Environmental and seasonal allergies 2023: Femur fracture, left ([place]) 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[place]) No date: History of methicillin resistant staphylococcus aureus (MRSA)     Comment:  years ago 05/29/2021: History of SCC (squamous cell carcinoma) of skin     Comment:  left neck, Moh's 05/29/21 No date: History of SCC (squamous cell carcinoma) of skin 09/30/2022: History of squamous cell carcinoma in situ (SCCIS)     Comment:  left  upper back ED&C done No date: Hypertension No date: Hyponatremia No date: Inappropriate sinus tachycardia No date: Pneumonia 08/28/2021: SCC (squamous cell carcinoma)     Comment:  upper chest right of midline, EDC done 09/17/2021 09/30/2022: SCC (squamous cell carcinoma)     Comment:  left chest  ED&C done No date: Sleep apnea     Comment:  does not wear CPAP ; uses humidifier instead 05/27/2022: Squamous cell carcinoma in situ (SCCIS)     Comment:  right upper back, ED&C 06/18/2022 09/30/2022: Squamous cell carcinoma in situ (SCCIS)     Comment:  right upper back ED&C done 02/19/2021: Squamous cell carcinoma of skin     Comment:  L inferior mandible, treated with [facility] 08/28/2021: Squamous cell carcinoma of skin     Comment:  R ant shoulder - ED&C 08/28/2021: Squamous cell carcinoma of skin     Comment:  L upper abdomen - ED&C  Past Surgical History: 06/17/2016: ANTERIOR CERVICAL DECOMP/DISCECTOMY FUSION; N/A     Comment:  Procedure: ANTERIOR CERVICAL DECOMPRESSION FUSION,               CERVICAL 3-4, CERVICAL 4-5 WITH INSTRUMENTATION AND               ALLOGRAFT;  Surgeon: Phylliss Bob, MD;  Location: [place];              Service: Orthopedics;  Laterality: N/A;  ANTERIOR               CERVICAL DECOMPRESSION FUSION, CERVICAL 3-4, CERVICAL 4-5  WITH INSTRUMENTATION AND ALLOGRAFT No date: BACK SURGERY     Comment:  x3 12/01/2007: NECK SURGERY 04/03/2021: PORTA CATH INSERTION; N/A     Comment:  Procedure: PORTA CATH INSERTION;  Surgeon: Algernon Huxley,              MD;  Location: [place] [facility];  Service:               Cardiovascular;  Laterality: N/A; 10/14/2022: VIDEO BRONCHOSCOPY WITH ENDOBRONCHIAL ULTRASOUND;  Bilateral     Comment:  Procedure: VIDEO BRONCHOSCOPY WITH ENDOBRONCHIAL               ULTRASOUND;  Surgeon: Tyler Pita, MD;  Location:               [facility];  Service: Pulmonary;  Laterality: Bilateral;  BMI    Body Mass Index: 30.56 kg/m       Reproductive/Obstetrics negative OB ROS                             Anesthesia Physical Anesthesia Plan  ASA: 3  Anesthesia Plan: Spinal   Post-op Pain Management:    Induction: Intravenous  PONV Risk Score and Plan:   Airway Management Planned: Natural Airway  Additional Equipment:   Intra-op Plan:   Post-operative Plan:   Informed Consent: I have reviewed the patients History and Physical, chart, labs and discussed the procedure including the risks, benefits and alternatives for the proposed anesthesia with the patient or authorized representative who has indicated his/her understanding and acceptance.     Dental Advisory Given  Plan Discussed with: CRNA and Surgeon  Anesthesia Plan Comments:        Anesthesia Quick Evaluation

## 2022-12-01 NOTE — Anesthesia Procedure Notes (Signed)
Spinal  Patient location during procedure: OR Start time: 12/01/2022 7:50 AM End time: 12/01/2022 7:57 AM Reason for block: surgical anesthesia Staffing Performed: resident/CRNA  Resident/CRNA: Nelda Marseille, CRNA Performed by: Nelda Marseille, CRNA Authorized by: [place] Service, Jane Canary, MD   Preanesthetic Checklist Completed: patient identified, IV checked, site marked, risks and benefits discussed, surgical consent, monitors and equipment checked, pre-op evaluation and timeout performed Spinal Block Patient position: sitting Prep: ChloraPrep Patient monitoring: heart rate, continuous pulse ox, blood pressure and cardiac monitor Approach: midline Location: L3-4 Injection technique: single-shot Needle Needle type: Whitacre and Introducer  Needle gauge: 25 G Needle length: 9 cm Assessment Sensory level: T10 Events: CSF return Additional Notes Sterile aseptic technique used throughout the procedure.  Negative paresthesia. Negative blood return. Positive free-flowing CSF. Expiration date of kit checked and confirmed. Patient tolerated procedure well, without complications.

## 2022-12-01 NOTE — Progress Notes (Signed)
PT Cancellation Note  Patient Details Name: Russell Simpson MRN: [id number] DOB: 12-17-1960   Cancelled Treatment:    Reason Eval/Treat Not Completed: Patient not medically ready (Per chart review, Pt to return to OR 1/3. This plan since orders received. Will sign off at this time and wait for new orders.)  3:33 PM, 12/01/22 Etta Grandchild, PT, DPT Physical Therapist - [facility]  [phone] ([place])     Atkins C 12/01/2022, 3:32 PM

## 2022-12-01 NOTE — Anesthesia Procedure Notes (Signed)
Date/Time: 12/01/2022 8:05 AM  Performed by: Nelda Marseille, CRNAPre-anesthesia Checklist: Patient identified, Emergency Drugs available, Suction available, Patient being monitored and Timeout performed Oxygen Delivery Method: Simple face mask

## 2022-12-01 NOTE — Progress Notes (Signed)
Thornton Park, MD Physician Orthopedics        Date of Service: 12/01/2022  7:44 AM      The patient has been re-examined, and the chart reviewed, and there have been no interval changes to the documented history and physical.     The risks, benefits, and alternatives have been discussed at length, and the patient is willing to proceed.

## 2022-12-01 NOTE — Op Note (Deleted)
The patient has been re-examined, and the chart reviewed, and there have been no interval changes to the documented history and physical.    The risks, benefits, and alternatives have been discussed at length, and the patient is willing to proceed.   

## 2022-12-01 NOTE — Progress Notes (Signed)
Patient awake/alert x4. Calm, no s/s of withdraw, no s/s seizure activity r/t etoh.

## 2022-12-02 ENCOUNTER — Inpatient Hospital Stay: Payer: Medicare HMO | Admitting: Anesthesiology

## 2022-12-02 ENCOUNTER — Inpatient Hospital Stay: Payer: Medicare HMO

## 2022-12-02 ENCOUNTER — Encounter: Payer: Self-pay | Admitting: Orthopedic Surgery

## 2022-12-02 ENCOUNTER — Encounter
Admission: EM | Disposition: A | Discharge status: Home / Self Care | Payer: Self-pay | Source: Home / Self Care | Attending: Orthopedic Surgery

## 2022-12-02 ENCOUNTER — Other Ambulatory Visit: Payer: Self-pay

## 2022-12-02 DIAGNOSIS — S82891A Other fracture of right lower leg, initial encounter for closed fracture: Secondary | ICD-10-CM | POA: Diagnosis not present

## 2022-12-02 HISTORY — PX: ORIF ANKLE FRACTURE: SHX5408

## 2022-12-02 LAB — BASIC METABOLIC PANEL
Anion gap: 9 (ref 5–15)
BUN: 11 mg/dL (ref 6–20)
CO2: 25 mmol/L (ref 22–32)
Calcium: 8.5 mg/dL — ABNORMAL LOW (ref 8.9–10.3)
Chloride: 103 mmol/L (ref 98–111)
Creatinine, Ser: 0.99 mg/dL (ref 0.61–1.24)
GFR, Estimated: >60 mL/min (ref >60)
Glucose, Bld: 138 mg/dL — ABNORMAL HIGH (ref 70–99)
Potassium: 3.6 mmol/L (ref 3.5–5.1)
Sodium: 137 mmol/L (ref 135–145)

## 2022-12-02 LAB — CBC
HCT: 36.6 % — ABNORMAL LOW (ref 39.0–52.0)
Hemoglobin: 12.8 g/dL — ABNORMAL LOW (ref 13.0–17.0)
MCH: 32.3 pg (ref 26.0–34.0)
MCHC: 35 g/dL (ref 30.0–36.0)
MCV: 92.4 fL (ref 80.0–100.0)
Platelets: 142 10*3/uL — ABNORMAL LOW (ref 150–400)
RBC: 3.96 MIL/uL — ABNORMAL LOW (ref 4.22–5.81)
RDW: 12.6 % (ref 11.5–15.5)
WBC: 6.8 10*3/uL (ref 4.0–10.5)
nRBC: 0 % (ref 0.0–0.2)

## 2022-12-02 LAB — HEPATIC FUNCTION PANEL
ALT: 27 U/L (ref 0–44)
AST: 19 U/L (ref 15–41)
Albumin: 3.4 g/dL — ABNORMAL LOW (ref 3.5–5.0)
Alkaline Phosphatase: 143 U/L — ABNORMAL HIGH (ref 38–126)
Bilirubin, Direct: 0.1 mg/dL (ref 0.0–0.2)
Indirect Bilirubin: 0.9 mg/dL (ref 0.3–0.9)
Total Bilirubin: 1 mg/dL (ref 0.3–1.2)
Total Protein: 5.8 g/dL — ABNORMAL LOW (ref 6.5–8.1)

## 2022-12-02 LAB — PHOSPHORUS: Phosphorus: 3.5 mg/dL (ref 2.5–4.6)

## 2022-12-02 LAB — MAGNESIUM: Magnesium: 2 mg/dL (ref 1.7–2.4)

## 2022-12-02 SURGERY — OPEN REDUCTION INTERNAL FIXATION (ORIF) ANKLE FRACTURE
Anesthesia: Spinal | Site: Ankle | Laterality: Right

## 2022-12-02 MED ORDER — PHENYLEPHRINE HCL (PRESSORS) 10 MG/ML IV SOLN
INTRAVENOUS | Status: DC | PRN
  Administered 2022-12-02 (×3): 120 ug via INTRAVENOUS

## 2022-12-02 MED ORDER — OXYCODONE HCL 5 MG PO TABS
ORAL_TABLET | ORAL | Status: AC
  Administered 2022-12-03: 15 mg via ORAL
  Filled 2022-12-02: qty 2

## 2022-12-02 MED ORDER — FAMOTIDINE 20 MG PO TABS
ORAL_TABLET | ORAL | Status: AC
  Administered 2022-12-02: 20 mg via ORAL
  Filled 2022-12-02: qty 1

## 2022-12-02 MED ORDER — PROPOFOL 10 MG/ML IV BOLUS
INTRAVENOUS | Status: DC | PRN
  Administered 2022-12-02: 50 mg via INTRAVENOUS
  Administered 2022-12-02: 30 mg via INTRAVENOUS

## 2022-12-02 MED ORDER — DOCUSATE SODIUM 100 MG PO CAPS
ORAL_CAPSULE | ORAL | Status: AC
  Administered 2022-12-02: 100 mg via ORAL
  Filled 2022-12-02: qty 1

## 2022-12-02 MED ORDER — FENTANYL CITRATE (PF) 100 MCG/2ML IJ SOLN
INTRAMUSCULAR | Status: AC
  Filled 2022-12-02: qty 2

## 2022-12-02 MED ORDER — HYDROMORPHONE HCL 1 MG/ML IJ SOLN
INTRAMUSCULAR | Status: AC
  Administered 2022-12-02: 1 mg via INTRAVENOUS
  Filled 2022-12-02: qty 1

## 2022-12-02 MED ORDER — OXYCODONE HCL 5 MG/5ML PO SOLN: 5.0000 mg | Freq: Once | ORAL | Status: DC | PRN

## 2022-12-02 MED ORDER — ONDANSETRON HCL 4 MG/2ML IJ SOLN
INTRAMUSCULAR | Status: AC
  Filled 2022-12-02: qty 2

## 2022-12-02 MED ORDER — BUPIVACAINE HCL (PF) 0.25 % IJ SOLN
INTRAMUSCULAR | Status: AC
  Filled 2022-12-02: qty 30

## 2022-12-02 MED ORDER — CEFAZOLIN SODIUM-DEXTROSE 1-4 GM/50ML-% IV SOLN
INTRAVENOUS | Status: DC | PRN
  Administered 2022-12-02: 2 g via INTRAVENOUS

## 2022-12-02 MED ORDER — ONDANSETRON HCL 4 MG/2ML IJ SOLN
INTRAMUSCULAR | Status: DC | PRN
  Administered 2022-12-02: 4 mg via INTRAVENOUS

## 2022-12-02 MED ORDER — BUPIVACAINE HCL (PF) 0.5 % IJ SOLN
INTRAMUSCULAR | Status: AC
  Filled 2022-12-02: qty 10

## 2022-12-02 MED ORDER — ACETAMINOPHEN 10 MG/ML IV SOLN: 1000.0000 mg | Freq: Once | INTRAVENOUS | Status: DC | PRN

## 2022-12-02 MED ORDER — LORATADINE 10 MG PO TABS
ORAL_TABLET | ORAL | Status: AC
  Filled 2022-12-02: qty 1

## 2022-12-02 MED ORDER — PROPOFOL 1000 MG/100ML IV EMUL
INTRAVENOUS | Status: AC
  Filled 2022-12-02: qty 100

## 2022-12-02 MED ORDER — FENTANYL CITRATE (PF) 100 MCG/2ML IJ SOLN: 25.0000 ug | INTRAMUSCULAR | Status: DC | PRN

## 2022-12-02 MED ORDER — HYDROMORPHONE HCL 1 MG/ML IJ SOLN
INTRAMUSCULAR | Status: AC
  Filled 2022-12-02: qty 0.5

## 2022-12-02 MED ORDER — GLYCOPYRROLATE 0.2 MG/ML IJ SOLN
INTRAMUSCULAR | Status: DC | PRN
  Administered 2022-12-02: .1 mg via INTRAVENOUS

## 2022-12-02 MED ORDER — PHENYLEPHRINE HCL-NACL 20-0.9 MG/250ML-% IV SOLN
INTRAVENOUS | Status: DC | PRN
  Administered 2022-12-02: 30 ug/min via INTRAVENOUS

## 2022-12-02 MED ORDER — ACETAMINOPHEN 500 MG PO TABS
ORAL_TABLET | ORAL | Status: AC
  Filled 2022-12-02: qty 2

## 2022-12-02 MED ORDER — LIDOCAINE HCL (PF) 2 % IJ SOLN
INTRAMUSCULAR | Status: AC
  Filled 2022-12-02: qty 5

## 2022-12-02 MED ORDER — ENOXAPARIN SODIUM 60 MG/0.6ML IJ SOSY
0.5000 mg/kg | PREFILLED_SYRINGE | INTRAMUSCULAR | Status: DC
  Administered 2022-12-02: 55 mg via SUBCUTANEOUS
  Filled 2022-12-02: qty 0.6

## 2022-12-02 MED ORDER — 0.9 % SODIUM CHLORIDE (POUR BTL) OPTIME
TOPICAL | Status: DC | PRN
  Administered 2022-12-02: 1000 mL

## 2022-12-02 MED ORDER — OXYCODONE HCL 5 MG PO TABS
ORAL_TABLET | ORAL | Status: AC
  Filled 2022-12-02: qty 2

## 2022-12-02 MED ORDER — GLYCOPYRROLATE 0.2 MG/ML IJ SOLN
INTRAMUSCULAR | Status: AC
  Filled 2022-12-02: qty 1

## 2022-12-02 MED ORDER — MIDAZOLAM HCL 5 MG/5ML IJ SOLN
INTRAMUSCULAR | Status: DC | PRN
  Administered 2022-12-02: 2 mg via INTRAVENOUS

## 2022-12-02 MED ORDER — ENOXAPARIN SODIUM 40 MG/0.4ML IJ SOSY: 40.0000 mg | PREFILLED_SYRINGE | INTRAMUSCULAR | Status: DC

## 2022-12-02 MED ORDER — BUPIVACAINE HCL (PF) 0.5 % IJ SOLN
INTRAMUSCULAR | Status: DC | PRN
  Administered 2022-12-02: 2.6 mL

## 2022-12-02 MED ORDER — CEFAZOLIN SODIUM 1 G IJ SOLR
INTRAMUSCULAR | Status: AC
  Filled 2022-12-02: qty 10

## 2022-12-02 MED ORDER — METHOCARBAMOL 500 MG PO TABS
ORAL_TABLET | ORAL | Status: AC
  Administered 2022-12-02: 500 mg via ORAL
  Filled 2022-12-02: qty 1

## 2022-12-02 MED ORDER — FAMOTIDINE 20 MG PO TABS
ORAL_TABLET | ORAL | Status: AC
  Filled 2022-12-02: qty 1

## 2022-12-02 MED ORDER — OXYCODONE HCL 5 MG PO TABS: 5.0000 mg | ORAL_TABLET | Freq: Once | ORAL | Status: DC | PRN

## 2022-12-02 MED ORDER — MIDAZOLAM HCL 2 MG/2ML IJ SOLN
INTRAMUSCULAR | Status: AC
  Filled 2022-12-02: qty 2

## 2022-12-02 MED ORDER — PHENYLEPHRINE 80 MCG/ML (10ML) SYRINGE FOR IV PUSH (FOR BLOOD PRESSURE SUPPORT)
PREFILLED_SYRINGE | INTRAVENOUS | Status: AC
  Filled 2022-12-02: qty 10

## 2022-12-02 MED ORDER — METHOCARBAMOL 500 MG PO TABS
ORAL_TABLET | ORAL | Status: AC
  Filled 2022-12-02: qty 1

## 2022-12-02 MED ORDER — PROPOFOL 500 MG/50ML IV EMUL
INTRAVENOUS | Status: DC | PRN
  Administered 2022-12-02: 125 ug/kg/min via INTRAVENOUS

## 2022-12-02 MED ORDER — FINASTERIDE 5 MG PO TABS
ORAL_TABLET | ORAL | Status: AC
  Filled 2022-12-02: qty 1

## 2022-12-02 MED ORDER — PHENYLEPHRINE HCL-NACL 20-0.9 MG/250ML-% IV SOLN
INTRAVENOUS | Status: AC
  Filled 2022-12-02: qty 250

## 2022-12-02 MED ORDER — SODIUM CHLORIDE 0.9 % IV SOLN: INTRAVENOUS | Status: DC | PRN

## 2022-12-02 MED ORDER — ONDANSETRON HCL 4 MG/2ML IJ SOLN: 4.0000 mg | Freq: Once | INTRAMUSCULAR | Status: DC | PRN

## 2022-12-02 MED ORDER — LIDOCAINE HCL (PF) 1 % IJ SOLN
INTRAMUSCULAR | Status: DC | PRN
  Administered 2022-12-02: 3 mL

## 2022-12-02 MED ORDER — FENTANYL CITRATE (PF) 100 MCG/2ML IJ SOLN
INTRAMUSCULAR | Status: DC | PRN
  Administered 2022-12-02: 25 ug via INTRAVENOUS

## 2022-12-02 MED ORDER — LACTATED RINGERS IV SOLN: INTRAVENOUS | Status: DC | PRN

## 2022-12-02 SURGICAL SUPPLY — 45 items
BIT DRILL Q/COUPLING 1 (BIT) IMPLANT
BLADE SURG 15 STRL LF DISP TIS (BLADE) ×2 IMPLANT
BLADE SURG 15 STRL SS (BLADE) ×1
BNDG CMPR 5X4 CHSV STRCH STRL (GAUZE/BANDAGES/DRESSINGS) ×1
BNDG CMPR STD VLCR NS LF 5.8X4 (GAUZE/BANDAGES/DRESSINGS) ×2
BNDG COHESIVE 4X5 TAN STRL LF (GAUZE/BANDAGES/DRESSINGS) ×2 IMPLANT
BNDG ELASTIC 4X5.8 VLCR NS LF (GAUZE/BANDAGES/DRESSINGS) ×4 IMPLANT
BNDG ESMARK 6X12 TAN STRL LF (GAUZE/BANDAGES/DRESSINGS) ×2 IMPLANT
CUFF TOURN SGL QUICK 24 (TOURNIQUET CUFF)
CUFF TOURN SGL QUICK 34 (TOURNIQUET CUFF)
CUFF TRNQT CYL 24X4X16.5-23 (TOURNIQUET CUFF) IMPLANT
CUFF TRNQT CYL 34X4.125X (TOURNIQUET CUFF) IMPLANT
DRAPE FLUOR MINI C-ARM 54X84 (DRAPES) ×2 IMPLANT
DRAPE INCISE IOBAN 66X45 STRL (DRAPES) ×2 IMPLANT
DRAPE U-SHAPE 47X51 STRL (DRAPES) ×2 IMPLANT
DURAPREP 26ML APPLICATOR (WOUND CARE) ×4 IMPLANT
ELECT REM PT RETURN 9FT ADLT (ELECTROSURGICAL) ×1
ELECTRODE REM PT RTRN 9FT ADLT (ELECTROSURGICAL) ×2 IMPLANT
GAUZE SPONGE 4X4 12PLY STRL (GAUZE/BANDAGES/DRESSINGS) ×2 IMPLANT
GAUZE XEROFORM 1X8 LF (GAUZE/BANDAGES/DRESSINGS) ×2 IMPLANT
GLOVE BIOGEL PI IND STRL 9 (GLOVE) ×2 IMPLANT
GLOVE BIOGEL PI ORTHO SZ9 (GLOVE) ×8 IMPLANT
GOWN STRL REUS TWL 2XL XL LVL4 (GOWN DISPOSABLE) ×2 IMPLANT
GOWN STRL REUS W/ TWL LRG LVL3 (GOWN DISPOSABLE) ×2 IMPLANT
GOWN STRL REUS W/TWL LRG LVL3 (GOWN DISPOSABLE) ×1
KIT TURNOVER KIT A (KITS) ×2 IMPLANT
LABEL OR SOLS (LABEL) ×2 IMPLANT
MANIFOLD NEPTUNE II (INSTRUMENTS) ×2 IMPLANT
NS IRRIG 1000ML POUR BTL (IV SOLUTION) ×2 IMPLANT
PACK EXTREMITY ARMC (MISCELLANEOUS) ×2 IMPLANT
PAD ABD DERMACEA PRESS 5X9 (GAUZE/BANDAGES/DRESSINGS) ×4 IMPLANT
PAD CAST 4YDX4 CTTN HI CHSV (CAST SUPPLIES) ×6 IMPLANT
PADDING CAST COTTON 4X4 STRL (CAST SUPPLIES) ×1
SCREW CORTEX ST 4.5X70 (Screw) IMPLANT
SPLINT CAST 1 STEP 4X30 (MISCELLANEOUS) ×4 IMPLANT
SPONGE T-LAP 18X18 ~~LOC~~+RFID (SPONGE) ×2 IMPLANT
STAPLER SKIN PROX 35W (STAPLE) ×2 IMPLANT
STOCKINETTE STRL 6IN 960660 (GAUZE/BANDAGES/DRESSINGS) ×2 IMPLANT
STRIP CLOSURE SKIN 1/2X4 (GAUZE/BANDAGES/DRESSINGS) ×4 IMPLANT
SUT VIC AB 2-0 SH 27 (SUTURE) ×1
SUT VIC AB 2-0 SH 27XBRD (SUTURE) ×4 IMPLANT
SYR 30ML LL (SYRINGE) ×2 IMPLANT
TAPE PAPER 2X10 WHT MICROPORE (GAUZE/BANDAGES/DRESSINGS) ×2 IMPLANT
TRAP FLUID SMOKE EVACUATOR (MISCELLANEOUS) ×2 IMPLANT
WATER STERILE IRR 500ML POUR (IV SOLUTION) ×2 IMPLANT

## 2022-12-02 NOTE — Anesthesia Procedure Notes (Signed)
Spinal  Patient location during procedure: OR Start time: 12/02/2022 1:25 PM End time: 12/02/2022 1:30 PM Reason for block: surgical anesthesia Staffing Performed: resident/CRNA  Anesthesiologist: Arita Miss, MD Resident/CRNA: Eilyn Polack, [place], CRNA Performed by: Monia Timmers, [place], CRNA Authorized by: Arita Miss, MD   Preanesthetic Checklist Completed: patient identified, IV checked, site marked, risks and benefits discussed, surgical consent, monitors and equipment checked, pre-op evaluation and timeout performed Spinal Block Patient position: sitting Prep: DuraPrep Patient monitoring: heart rate, cardiac monitor, continuous pulse ox and blood pressure Approach: midline Location: L3-4 Injection technique: single-shot Needle Needle type: Sprotte  Needle gauge: 24 G Needle length: 9 cm Assessment Sensory level: T4 Events: CSF return

## 2022-12-02 NOTE — Op Note (Signed)
12/02/2022  2:49 PM  PATIENT:  Russell Simpson    PRE-OPERATIVE DIAGNOSIS:  syndesmotic disruption of right ankle  POST-OPERATIVE DIAGNOSIS:  Same  PROCEDURE:  OPEN REDUCTION INTERNAL FIXATION (ORIF) RIGHT ANKLE SYNDESMOTIC DISRUPTION  SURGEON:  Thornton Park, MD  ANESTHESIA:   General  PREOPERATIVE INDICATIONS:  Russell Simpson is a  62 y.o. male with a diagnosis of syndesmotic disruption of right ankle who failed conservative measures and elected for surgical management.    I discussed the risks and benefits of surgery. The risks include but are not limited to infection, bleeding requiring blood transfusion, nerve or blood vessel injury, joint stiffness or loss of motion, persistent pain, weakness or instability, malunion, nonunion and hardware failure and the need for further surgery. Medical risks include but are not limited to DVT and pulmonary embolism, myocardial infarction, stroke, pneumonia, respiratory failure and death. Patient understood these risks and wished to proceed.    OPERATIVE IMPLANTS: Synthes 4.53m x 70 mm large fragment screw for stabilization of the syndesmosis   OPERATIVE FINDINGS: Widening of the right syndesmosis  OPERATIVE PROCEDURE: Patient was seen in the preoperative area today.  He remained neurovascularly intact.  His AO splint was in place.  Postoperative x-rays in the PACU yesterday following ORIF of the lateral malleolus fracture and medial ligament repair revealed persistent widening of the medial clear space and syndesmosis.  The decision was therefore made to take the patient back to the OR today to stabilize the syndesmosis.  Patient was brought to the operating room.  He underwent placement of a spinal anesthetic.  He was positioned supine on the operative table.  A bump was placed under his right hip.  A tourniquet was applied to his right thigh.  He was prepped and draped in a sterile fashion.  A timeout was performed to verify the patient's name,  date of birth from medical record number, correct site of surgery and correct procedure to be performed.  Patient received 2 g of Ancef prior to the onset of the case.  Once all in attendance were in agreement the case began.  Patient has right lower extremity exsanguinated with an Esmarch and the tourniquet inflated to 275 mmHg for 33 minutes.  FluoroScan imaging was used to mark the proximal most cancellous screw just below the patient's fibula fracture.  The skin staples overlying the screw were removed.  2-0 PDS suture knots overlying the screw were removed.  A small fragment screwdriver was used to remove the proximal most 4.0 mm cancellous screw.  A large fragment drill was then used to create a hole for a 4.5 mm large fragment screw parallel to the ankle joint.  The placement of the drill bit was confirmed on FluoroScan imaging.  A depth gauge was used to measure the length of the screw.  It was measured to 70 mm.  A 70 mm x 4.5 mm large fragment screw was then advanced by hand across the 4 cortices of the fibula and tibia.  FluoroScan images were taken which confirmed closure of the syndesmosis and medial clear space.  A stress test was performed which did not show instability of the syndesmosis or increased medial clear space.  The lateral wound was then copiously irrigated.  2-0 Vicryl and skin staples were used to close the wound.  A dry sterile dressing was applied along with an AO splint.  The tourniquet was deflated at 33 minutes.  Patient was answered to a hospital bed and brought to the recovery  room in stable condition.  Prescription present for the entire case.  All sharp, sponge and instrument counts were correct at conclusion the case.

## 2022-12-02 NOTE — Progress Notes (Signed)
[facility] Progress Note  Patient: Russell Simpson    HEN:[id number]  DOA: 11/30/2022     Date of Service: the patient was seen and examined on 12/02/2022  Chief Complaint  Patient presents with   Ankle Pain   Fall   Brief hospital course:  Russell Simpson is a 62 y.o. male with medical history significant of hypertension, hyperlipidemia, inappropriate sinus tachycardia, COPD, stage IV follicular lymphoma s/p chemotherapy, idiopathic hyperprolactinemia, bipolar disorder, panic disorder who presents to the ED after a ground-level fall with ankle pain. Patient had following issues so [facility] was consulted for comanagement.   Assessment and Plan: High anion gap metabolic acidosis, Resolved  Suspect a combination of alcoholic ketoacidosis and lactic acidosis.  Lactic acid is elevated at 5.9. Low suspicion for underlying infection. No abdominal pain, nausea or vomiting to suggest pancreatitis.  S/p IV fluid resuscitation - Alk phos 143 slightly elevated.  The LFTs within normal range  Magnesium and phosphorus wnl, CK 90 wnl    Ankle fracture In the setting of ground level fall while intoxicated.  - Management per primary team (Orthopedic Surgery)   COPD (chronic obstructive pulmonary disease) ([place]) No wheezing on examination today.   - Continue home bronchodilators - Guaifenesin for chronic cough   HTN (hypertension) - Continue home antihypertensives   Abnormal LFTs In the setting of active alcohol use disorder.  LFT elevations are chronic in nature and no history of cirrhosis.  -Monitor LFTs while admitted with daily CMP   Hyponatremia, Resolved  History of intermittent hyponatremia that is mild in nature likely beer potomania given high daily beer intake.  - Monitor BMP while admitted   Chronic diastolic CHF (congestive heart failure) (HCC) Euvolemic at this time. Last echo in 2021 with preserved EF with G1DD.  S/p IVF, currently euvolemic    Alcohol use disorder, severe,  dependence ([place]) Patient states he currently drinks 6-12 beers per day. No history of alcohol withdrawal - CIWA without Ativan - Thiamine and folic acid - Counseling on importance of cessation. Patient is motivated.    Body mass index is 30.56 kg/m.  Interventions:    [facility] will follow along.      Diet: Regular diet DVT Prophylaxis: SCD, pharmacological prophylaxis contraindicated due to surgical intervention   Defer to orthopedic surgery to start DVT prophylaxis  Advance goals of care discussion: Full code  Family Communication: family was not present at bedside, at the time of interview.  The pt provided permission to discuss medical plan with the family. Opportunity was given to ask question and all questions were answered satisfactorily.   Disposition:  Pt is from Home, admitted with ground-level fall and right ankle fracture, admitted under orthopedic surgery, had surgery on 1/2 and second surgery on 1/3.  Patient will need PT and OT eval for disposition plan and will be discharged by primary team orthopedic surgery when stable   Subjective: No significant overnight events, patient's pain is under control, patient is awaiting for second surgery.  No any other medical issues.  Physical Exam: General:  alert oriented to time, place, and person.  Appear in no distress, affect appropriate Eyes: PERRLA ENT: Oral Mucosa Clear, moist  Neck:  no JVD,  Cardiovascular: S1 and S2 Present, no Murmur,  Respiratory: good respiratory effort, Bilateral Air entry equal and Decreased, no Crackles, no wheezes Abdomen: Bowel Sound present, Soft and no tenderness,  Skin: no rashes Extremities: no Pedal edema, no calf tenderness Neurologic: without any new focal findings Gait not  checked due to patient safety concerns  Vitals:   12/02/22 1620 12/02/22 1630 12/02/22 1640 12/02/22 1650  BP: 122/86 121/78 (!) 123/97   Pulse: 76 85 89 90  Resp: _0 Temp:   (!) 97.4 F (36.3 C)    TempSrc:      SpO2: 95% 93% 92% 94%  Weight:      Height:        Intake/Output Summary (Last 24 hours) at 12/02/2022 1656 Last data filed at 12/02/2022 1441 Gross per 24 hour  Intake 3524.02 ml  Output 1752 ml  Net 1772.02 ml   Filed Weights   11/30/22 0542  Weight: 108 kg    Data Reviewed: I have personally reviewed and interpreted daily labs, tele strips, imagings as discussed above. I reviewed all nursing notes, pharmacy notes, vitals, pertinent old records I have discussed plan of care as described above with RN and patient/family.  CBC: Recent Labs  Lab 11/30/22 0700 12/01/22 0617 12/02/22 0626  WBC 13.2* 6.1 6.8  NEUTROABS  --  3.3  --   HGB 14.9 14.1 12.8*  HCT 43.4 41.3 36.6*  MCV 93.7 92.6 92.4  PLT 175 134* 631*   Basic Metabolic Panel: Recent Labs  Lab 11/30/22 0700 12/01/22 0617 12/02/22 0626  NA 130* 135 137  K 4.0 4.0 3.6  CL 96* 100 103  CO2 18* 25 25  GLUCOSE 192* 161* 138*  BUN _1 CREATININE 1.29* 0.96 0.99  CALCIUM 8.3* 8.7* 8.5*  MG 2.3 1.9 2.0  PHOS 4.9* 4.0 3.5    Studies: DG Ankle Complete Right  Result Date: 12/02/2022 CLINICAL DATA:  Postop EXAM: RIGHT ANKLE - COMPLETE 3+ VIEW COMPARISON:  11/30/2022 FINDINGS: Interval surgical plate and multiple screw fixation of the distal fibula with long fixating screw across the distal fibula and tibia for trimalleolar fracture. There is anatomic alignment. Reduction of previously noted laterally displaced talar dome. IMPRESSION: Interval surgical fixation of trimalleolar fracture with anatomic alignment. Electronically Signed   By: Donavan Foil M.D.   On: 12/02/2022 15:25   DG MINI C-ARM IMAGE ONLY  Result Date: 12/02/2022 There is no interpretation for this exam.  This order is for images obtained during a surgical procedure.  Please See "Surgeries" Tab for more information regarding the procedure.    Scheduled Meds:  acetaminophen       [MAR Hold] amLODipine  5 mg Oral QPM   [MAR  Hold] atorvastatin  80 mg Oral Daily   [MAR Hold] buPROPion  150 mg Oral BID   [MAR Hold] cefdinir  300 mg Oral BID   [MAR Hold] docusate sodium  100 mg Oral BID   [MAR Hold] DULoxetine  60 mg Oral QPM   famotidine       [MAR Hold] famotidine  20 mg Oral BID   [MAR Hold] finasteride  5 mg Oral QPM   [MAR Hold] fluticasone furoate-vilanterol  1 puff Inhalation Daily   And   [MAR Hold] umeclidinium bromide  1 puff Inhalation Daily   [MAR Hold] folic acid  1 mg Oral Daily   HYDROmorphone       [MAR Hold] loratadine  10 mg Oral QPM   [MAR Hold] metoprolol succinate  100 mg Oral Daily   [MAR Hold] mirtazapine  15 mg Oral QHS   [MAR Hold] montelukast  10 mg Oral Daily   [MAR Hold] mupirocin ointment  1 Application Topical See admin instructions   [MAR Hold] naltrexone  50 mg Oral Daily   [MAR Hold] nicotine  21 mg Transdermal Daily   [MAR Hold] OLANZapine  5 mg Oral QHS   oxyCODONE       [MAR Hold] tenofovir  300 mg Oral QPM   [MAR Hold] thiamine (VITAMIN B1) injection  100 mg Intravenous Daily   Continuous Infusions:  acetaminophen     [MAR Hold]  ceFAZolin (ANCEF) IV     [MAR Hold] methocarbamol (ROBAXIN) IV     PRN Meds: acetaminophen, acetaminophen, [MAR Hold] acetaminophen, [MAR Hold] albuterol, [MAR Hold] alum & mag hydroxide-simeth, [MAR Hold] bisacodyl, [MAR Hold] clonazePAM, famotidine, fentaNYL (SUBLIMAZE) injection, HYDROmorphone, [MAR Hold]  HYDROmorphone (DILAUDID) injection, [MAR Hold] menthol-cetylpyridinium **OR** [MAR Hold] phenol, [MAR Hold] methocarbamol **OR** [MAR Hold] methocarbamol (ROBAXIN) IV, [MAR Hold] ondansetron **OR** [MAR Hold] ondansetron (ZOFRAN) IV, ondansetron (ZOFRAN) IV, [MAR Hold] mouth rinse, oxyCODONE, [MAR Hold] oxyCODONE, oxyCODONE **OR** oxyCODONE, [MAR Hold] oxyCODONE, [MAR Hold] polyethylene glycol  Time spent: 35 minutes  Author: Val Riles. MD [facility] 12/02/2022 4:56 PM  To reach On-call, see care teams to locate the attending  and reach out to them via [email or web address]. If 7PM-7AM, please contact night-coverage If you still have difficulty reaching the attending provider, please page the [facility] (Director on Call) for [facility] on amion for assistance.

## 2022-12-02 NOTE — Progress Notes (Signed)
PHARMACIST - PHYSICIAN COMMUNICATION  CONCERNING:  Enoxaparin (Lovenox) for DVT Prophylaxis    RECOMMENDATION: Patient was prescribed enoxaprin '40mg'$  q24 hours for VTE prophylaxis.   Filed Weights   11/30/22 0542  Weight: 108 kg (238 lb)    Body mass index is 30.56 kg/m.  Estimated Creatinine Clearance: 103.8 mL/min (by C-G formula based on SCr of 0.99 mg/dL).   Based on [place] patient is candidate for enoxaparin 0.'5mg'$ /kg TBW SQ every 24 hours based on BMI being >30.  DESCRIPTION: Pharmacy has adjusted enoxaparin dose per [facility] policy.  Patient is now receiving enoxaparin 55 mg every 24 hours    Alison Murray, PharmD Clinical Pharmacist  12/02/2022 5:17 PM

## 2022-12-02 NOTE — Transfer of Care (Addendum)
Immediate Anesthesia Transfer of Care Note  Patient: Russell Simpson  Procedure(s) Performed: OPEN REDUCTION INTERNAL FIXATION (ORIF) ANKLE FRACTURE (Right: Ankle)  Patient Location: PACU  Anesthesia Type:General and Spinal  Level of Consciousness: awake, alert , and oriented  Airway & Oxygen Therapy: Patient Spontanous Breathing and Patient connected to nasal cannula oxygen  Post-op Assessment: Report given to RN and Post -op Vital signs reviewed and stable  Post vital signs: Reviewed and stable  Last Vitals:  Vitals Value Taken Time  BP 102/73 12/02/22 1450  Temp 36 C 12/02/22 1447  Pulse 79 12/02/22 1450  Resp 12 12/02/22 1450  SpO2 98 % 12/02/22 1450  Vitals shown include unvalidated device data.  Last Pain:  Vitals:   12/02/22 0955  TempSrc:   PainSc: Asleep         Complications: No notable events documented.

## 2022-12-02 NOTE — Anesthesia Preprocedure Evaluation (Signed)
Anesthesia Evaluation  Patient identified by MRN, date of birth, ID band Patient awake  General Assessment Comment:  Patient with active sinus infection / URI  Reviewed: Allergy & Precautions, NPO status , Patient's Chart, lab work & pertinent test results  Airway Mallampati: III  TM Distance: >3 FB Neck ROM: full    Dental  (+) Teeth Intact   Pulmonary shortness of breath, sleep apnea , COPD, Recent URI , Residual Cough, Current Smoker and Patient abstained from smoking.   Pulmonary exam normal  + decreased breath sounds      Cardiovascular Exercise Tolerance: Poor hypertension, Pt. on medications +CHF  Normal cardiovascular exam Rhythm:Regular  Grade I diastolic dysfunction   Neuro/Psych  PSYCHIATRIC DISORDERS Anxiety Depression Bipolar Disorder   negative neurological ROS     GI/Hepatic negative GI ROS,,,(+)     substance abuse  alcohol use  Endo/Other  negative endocrine ROS  Morbid obesity  Renal/GU negative Renal ROS     Musculoskeletal   Abdominal  (+) + obese  Peds negative pediatric ROS (+)  Hematology negative hematology ROS (+)   Anesthesia Other Findings Past Medical History: No date: 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HCC) No date: Arthritis No date: Asthma 01/28/2021: Basal cell carcinoma     Comment:  R upper arm, EDC 03/04/2021 No date: Brain tumor (benign) (HCC)     Comment:  benign pituitary neoplasm No date: Chronic pain     Comment:  right arm No date: COPD (chronic obstructive pulmonary disease) (HCC) No date: Depression No date: Dyspnea No date: Elevated liver enzymes No date: Environmental and seasonal allergies 2023: Femur fracture, left ([place]) 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[place]) No date: History of methicillin resistant staphylococcus aureus (MRSA)     Comment:  years ago 05/29/2021: History of SCC (squamous cell carcinoma) of skin     Comment:  left neck, Moh's 05/29/21 No date:  History of SCC (squamous cell carcinoma) of skin 09/30/2022: History of squamous cell carcinoma in situ (SCCIS)     Comment:  left upper back ED&C done No date: Hypertension No date: Hyponatremia No date: Inappropriate sinus tachycardia No date: Pneumonia 08/28/2021: SCC (squamous cell carcinoma)     Comment:  upper chest right of midline, EDC done 09/17/2021 09/30/2022: SCC (squamous cell carcinoma)     Comment:  left chest  ED&C done No date: Sleep apnea     Comment:  does not wear CPAP ; uses humidifier instead 05/27/2022: Squamous cell carcinoma in situ (SCCIS)     Comment:  right upper back, ED&C 06/18/2022 09/30/2022: Squamous cell carcinoma in situ (SCCIS)     Comment:  right upper back ED&C done 02/19/2021: Squamous cell carcinoma of skin     Comment:  L inferior mandible, treated with [facility] 08/28/2021: Squamous cell carcinoma of skin     Comment:  R ant shoulder - ED&C 08/28/2021: Squamous cell carcinoma of skin     Comment:  L upper abdomen - ED&C  Past Surgical History: 06/17/2016: ANTERIOR CERVICAL DECOMP/DISCECTOMY FUSION; N/A     Comment:  Procedure: ANTERIOR CERVICAL DECOMPRESSION FUSION,               CERVICAL 3-4, CERVICAL 4-5 WITH INSTRUMENTATION AND               ALLOGRAFT;  Surgeon: Phylliss Bob, MD;  Location: [place];              Service: Orthopedics;  Laterality: N/A;  ANTERIOR  CERVICAL DECOMPRESSION FUSION, CERVICAL 3-4, CERVICAL 4-5              WITH INSTRUMENTATION AND ALLOGRAFT No date: BACK SURGERY     Comment:  x3 12/01/2007: NECK SURGERY 04/03/2021: PORTA CATH INSERTION; N/A     Comment:  Procedure: PORTA CATH INSERTION;  Surgeon: Algernon Huxley,              MD;  Location: [place] [facility];  Service:               Cardiovascular;  Laterality: N/A; 10/14/2022: VIDEO BRONCHOSCOPY WITH ENDOBRONCHIAL ULTRASOUND;  Bilateral     Comment:  Procedure: VIDEO BRONCHOSCOPY WITH ENDOBRONCHIAL               ULTRASOUND;  Surgeon: Tyler Pita, MD;  Location:               [facility];  Service: Pulmonary;  Laterality: Bilateral;  BMI    Body Mass Index: 30.56 kg/m      Reproductive/Obstetrics negative OB ROS                              Anesthesia Physical Anesthesia Plan  ASA: 3 and emergent  Anesthesia Plan: Spinal   Post-op Pain Management:    Induction: Intravenous  PONV Risk Score and Plan: 1 and Ondansetron, Dexamethasone, Propofol infusion, TIVA and Midazolam  Airway Management Planned: Natural Airway  Additional Equipment: None  Intra-op Plan:   Post-operative Plan:   Informed Consent: I have reviewed the patients History and Physical, chart, labs and discussed the procedure including the risks, benefits and alternatives for the proposed anesthesia with the patient or authorized representative who has indicated his/her understanding and acceptance.     Dental Advisory Given  Plan Discussed with: CRNA and Surgeon  Anesthesia Plan Comments: (Patient tolerated spinal anesthesia well yesterday. Case yesterday, along with today's, unable to be postponed until resolution of URI due to urgent nature of fracture. Discussed R/B/A of neuraxial anesthesia technique with patient: - rare risks of spinal/epidural hematoma, nerve damage, infection - Risk of PDPH - Risk of nausea and vomiting - Risk of conversion to general anesthesia and its associated risks, including sore throat, damage to lips/eyes/teeth/oropharynx, and rare risks such as cardiac and respiratory events. - Risk of allergic reactions  Discussed the role of CRNA in patient's perioperative care.  Patient voiced understanding.)        Anesthesia Quick Evaluation

## 2022-12-02 NOTE — Progress Notes (Signed)
The patient has been re-examined, and the chart reviewed, and there have been no interval changes to the documented history and physical.    The risks, benefits, and alternatives have been discussed at length, and the patient is willing to proceed.   

## 2022-12-03 ENCOUNTER — Encounter: Payer: Self-pay | Admitting: Orthopedic Surgery

## 2022-12-03 ENCOUNTER — Ambulatory Visit: Payer: Medicare HMO | Admitting: Pulmonary Disease

## 2022-12-03 LAB — CBC
HCT: 38.2 % — ABNORMAL LOW (ref 39.0–52.0)
Hemoglobin: 12.8 g/dL — ABNORMAL LOW (ref 13.0–17.0)
MCH: 32.3 pg (ref 26.0–34.0)
MCHC: 33.5 g/dL (ref 30.0–36.0)
MCV: 96.5 fL (ref 80.0–100.0)
Platelets: 143 10*3/uL — ABNORMAL LOW (ref 150–400)
RBC: 3.96 MIL/uL — ABNORMAL LOW (ref 4.22–5.81)
RDW: 12.7 % (ref 11.5–15.5)
WBC: 7.1 10*3/uL (ref 4.0–10.5)
nRBC: 0 % (ref 0.0–0.2)

## 2022-12-03 LAB — BASIC METABOLIC PANEL
Anion gap: 9 (ref 5–15)
BUN: 10 mg/dL (ref 6–20)
CO2: 28 mmol/L (ref 22–32)
Calcium: 8.4 mg/dL — ABNORMAL LOW (ref 8.9–10.3)
Chloride: 95 mmol/L — ABNORMAL LOW (ref 98–111)
Creatinine, Ser: 0.99 mg/dL (ref 0.61–1.24)
GFR, Estimated: >60 mL/min (ref >60)
Glucose, Bld: 118 mg/dL — ABNORMAL HIGH (ref 70–99)
Potassium: 3.9 mmol/L (ref 3.5–5.1)
Sodium: 132 mmol/L — ABNORMAL LOW (ref 135–145)

## 2022-12-03 LAB — HEPATIC FUNCTION PANEL
ALT: 26 U/L (ref 0–44)
AST: 25 U/L (ref 15–41)
Albumin: 3.5 g/dL (ref 3.5–5.0)
Alkaline Phosphatase: 139 U/L — ABNORMAL HIGH (ref 38–126)
Bilirubin, Direct: 0.2 mg/dL (ref 0.0–0.2)
Indirect Bilirubin: 0.7 mg/dL (ref 0.3–0.9)
Total Bilirubin: 0.9 mg/dL (ref 0.3–1.2)
Total Protein: 6.1 g/dL — ABNORMAL LOW (ref 6.5–8.1)

## 2022-12-03 LAB — PHOSPHORUS: Phosphorus: 3.7 mg/dL (ref 2.5–4.6)

## 2022-12-03 LAB — MAGNESIUM: Magnesium: 1.7 mg/dL (ref 1.7–2.4)

## 2022-12-03 MED ORDER — METHOCARBAMOL 500 MG PO TABS
ORAL_TABLET | ORAL | Status: AC
  Filled 2022-12-03: qty 1

## 2022-12-03 MED ORDER — OXYCODONE HCL 5 MG PO TABS
ORAL_TABLET | ORAL | Status: AC
  Filled 2022-12-03: qty 3

## 2022-12-03 MED ORDER — OXYCODONE HCL 5 MG PO TABS: 5.0000 mg | ORAL_TABLET | ORAL | 0 refills | Status: DC | PRN

## 2022-12-03 MED ORDER — OXYCODONE HCL 5 MG PO TABS
ORAL_TABLET | ORAL | Status: AC
  Administered 2022-12-03: 15 mg via ORAL
  Filled 2022-12-03: qty 3

## 2022-12-03 MED ORDER — WHITE PETROLATUM EX OINT
TOPICAL_OINTMENT | CUTANEOUS | Status: AC
  Filled 2022-12-03: qty 5

## 2022-12-03 MED ORDER — METHOCARBAMOL 500 MG PO TABS
ORAL_TABLET | ORAL | Status: AC
  Administered 2022-12-03: 500 mg via ORAL
  Filled 2022-12-03: qty 1

## 2022-12-03 MED ORDER — ATORVASTATIN CALCIUM 20 MG PO TABS
ORAL_TABLET | ORAL | Status: AC
  Administered 2022-12-03: 80 mg via ORAL
  Filled 2022-12-03: qty 4

## 2022-12-03 MED ORDER — HYDROMORPHONE HCL 1 MG/ML IJ SOLN
INTRAMUSCULAR | Status: AC
  Administered 2022-12-03: 0.5 mg via INTRAVENOUS
  Filled 2022-12-03: qty 0.5

## 2022-12-03 MED ORDER — ASPIRIN 325 MG PO TBEC: 325.0000 mg | DELAYED_RELEASE_TABLET | Freq: Two times a day (BID) | ORAL | 0 refills | Status: DC

## 2022-12-03 MED ORDER — FAMOTIDINE 20 MG PO TABS
ORAL_TABLET | ORAL | Status: AC
  Administered 2022-12-03: 20 mg via ORAL
  Filled 2022-12-03: qty 1

## 2022-12-03 MED ORDER — DOCUSATE SODIUM 100 MG PO CAPS
ORAL_CAPSULE | ORAL | Status: AC
  Administered 2022-12-03: 100 mg via ORAL
  Filled 2022-12-03: qty 1

## 2022-12-03 NOTE — Evaluation (Signed)
Occupational Therapy Evaluation Patient Details Name: Russell Simpson MRN: [id number] DOB: 15-Jul-1961 Today's Date: 12/03/2022   History of Present Illness Russell Simpson is a 62 y.o. male with medical history significant of hypertension, hyperlipidemia, inappropriate sinus tachycardia, COPD, stage IV follicular lymphoma s/p chemotherapy, idiopathic hyperprolactinemia, bipolar disorder, panic disorder who presents to the ED after a ground-level fall with ankle pain. Found to have RIGHT TRIMALLEOLAR ANKLE FRACTURE EQUIVALENT, underwent ORIF on 12/01/22 and ORIF R ankle syndesmotic disruption on 12/02/22.   Clinical Impression   Mr Russell Simpson was seen for OT evaluation this date. Prior to hospital admission, pt was IND. Pt lives alone, plan for friend to assist with meals/errands daily. Pt presents to acute OT demonstrating impaired ADL performance and functional mobility 2/2 decreased activity tolerance and functional strength/ROM/balance deficits. Pt currently requires MIN A don pants, posterior LOBs with pt plopping back on to bed from standing. MIN A toileting standing, pt elects to make RLE TTWBing for balance, instructed in [place]. MOD I seated grooming tasks. MIN A + RW toielt t/f. Pt would benefit from skilled OT to address noted impairments and functional limitations (see below for any additional details). Upon hospital discharge, recommend HHOT to maximize pt safety and return to PLOF.    Recommendations for follow up therapy are one component of a multi-disciplinary discharge planning process, led by the attending physician.  Recommendations may be updated based on patient status, additional functional criteria and insurance authorization.   Follow Up Recommendations  Home health OT     Assistance Recommended at Discharge Intermittent Supervision/Assistance  Patient can return home with the following A little help with walking and/or transfers;A little help with bathing/dressing/bathroom;Help with  stairs or ramp for entrance    Functional Status Assessment  Patient has had a recent decline in their functional status and demonstrates the ability to make significant improvements in function in a reasonable and predictable amount of time.  Equipment Recommendations  Wheelchair (measurements OT)    Recommendations for Other Services       Precautions / Restrictions Precautions Precautions: Fall Restrictions Weight Bearing Restrictions: Yes RLE Weight Bearing: Non weight bearing      Mobility Bed Mobility Overal bed mobility: Needs Assistance Bed Mobility: Supine to Sit, Sit to Supine     Supine to sit: Supervision Sit to supine: Supervision        Transfers Overall transfer level: Needs assistance Equipment used: Rolling walker (2 wheels) Transfers: Sit to/from Stand Sit to Stand: Min assist           General transfer comment: poor eccentric control      Balance Overall balance assessment: Needs assistance Sitting-balance support: No upper extremity supported, Feet supported Sitting balance-Leahy Scale: Normal     Standing balance support: Single extremity supported, During functional activity Standing balance-Leahy Scale: Poor                             ADL either performed or assessed with clinical judgement   ADL Overall ADL's : Needs assistance/impaired                                       General ADL Comments: MIN A don pants, posterior LOBs with pt plopping back on to bed from standing. MIN A toileting standing, pt elects to make RLE TTWBing for balance, instructed in [place]. MOD I  seated grooming tasks. MIN A + RW toielt t/f      Pertinent Vitals/Pain Pain Assessment Pain Assessment: 0-10 Pain Score: 10-Worst pain ever Pain Location: R ankle Pain Descriptors / Indicators: Discomfort, Grimacing Pain Intervention(s): Limited activity within patient's tolerance, Patient requesting pain meds-RN notified      Hand Dominance     Extremity/Trunk Assessment Upper Extremity Assessment Upper Extremity Assessment: Overall WFL for tasks assessed   Lower Extremity Assessment Lower Extremity Assessment: Generalized weakness       Communication Communication Communication: No difficulties   Cognition Arousal/Alertness: Awake/alert Behavior During Therapy: WFL for tasks assessed/performed, Impulsive Overall Cognitive Status: Within Functional Limits for tasks assessed                                        Home Living Family/patient expects to be discharged to:: Private residence Living Arrangements: Alone Available Help at Discharge: Friend(s);Available PRN/intermittently Type of Home: House Home Access: Stairs to enter [employer] of Steps: 3 Entrance Stairs-Rails: None Home Layout: One level     Bathroom Shower/Tub: Tub/shower unit         Home Equipment: Other (comment) (knee scooter)          Prior Functioning/Environment Prior Level of Function : Independent/Modified Independent;Driving             Mobility Comments: 2 falls in last 3 months          OT Problem List: Decreased strength;Decreased activity tolerance;Impaired balance (sitting and/or standing);Decreased safety awareness      OT Treatment/Interventions: Self-care/ADL training;Therapeutic exercise;DME and/or AE instruction;Energy conservation;Therapeutic activities;Patient/family education;Balance training    OT Goals(Current goals can be found in the care plan section) Acute Rehab OT Goals Patient Stated Goal: to go home OT Goal Formulation: With patient Time For Goal Achievement: 12/17/22 Potential to Achieve Goals: Good ADL Goals Pt Will Perform Grooming: with modified independence;standing Pt Will Perform Lower Body Dressing: with modified independence;sit to/from stand Pt Will Transfer to Toilet: with modified independence;ambulating;regular height toilet  OT  Frequency: Min 2X/week    Co-evaluation              AM-PAC OT "6 Clicks" Daily Activity     Outcome Measure Help from another person eating meals?: None Help from another person taking care of personal grooming?: A Little Help from another person toileting, which includes using toliet, bedpan, or urinal?: A Little Help from another person bathing (including washing, rinsing, drying)?: A Little Help from another person to put on and taking off regular upper body clothing?: None Help from another person to put on and taking off regular lower body clothing?: A Little 6 Click Score: 20   End of Session Nurse Communication: Mobility status  Activity Tolerance: Patient tolerated treatment well Patient left: in chair;with call bell/phone within reach;with nursing/sitter in room;with family/visitor present  OT Visit Diagnosis: Other abnormalities of gait and mobility (R26.89);Muscle weakness (generalized) (M62.81)                Time: 0814-4818 OT Time Calculation (min): 50 min Charges:  OT General Charges $OT Visit: 1 Visit OT Evaluation $OT Eval Moderate Complexity: 1 Mod OT Treatments $Self Care/Home Management : 38-52 mins  Dessie Coma, M.S. OTR/L  12/03/22, 1:07 PM  ascom [phone]

## 2022-12-03 NOTE — Discharge Summary (Signed)
Physician Discharge Summary  Patient ID: Russell Simpson MRN: [id number] DOB/AGE: 61-Nov-1962 61 y.o.  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[place])    Arthritis    Asthma    Basal cell carcinoma 01/28/2021   R upper arm, EDC 03/04/2021   Brain 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   Chronic pain    right arm   COPD (chronic obstructive pulmonary disease) ([place])    Depression    Dyspnea    Elevated liver enzymes    Environmental and seasonal allergies    Femur fracture, left (HCC) 2458   Follicular lymphoma ([place])    History of methicillin resistant staphylococcus aureus (MRSA)    years ago   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   left neck, Moh's 05/29/21   History of SCC (squamous cell carcinoma) of skin    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   left upper back ED&C done   Hypertension    Hyponatremia    Inappropriate sinus tachycardia    Pneumonia    SCC (squamous cell carcinoma) 08/28/2021   upper chest right of midline, EDC done 09/17/2021   SCC (squamous cell carcinoma) 09/30/2022   left chest  ED&C done   Sleep apnea    does not wear CPAP ; uses humidifier instead   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 male who was admitted on 11/30/2022 for a right ankle trimalleolar fracture equivalent.  Patient was cleared by the medicine service for surgery.  Patient was brought to the OR on 12/01/22 for open reduction internal fixation of the comminuted lateral malleolus fracture with open fixation of avulsed medial capsule and deltoid ligaments.  Postoperative x-rays in the recovery room demonstrated some persistent widening of the medial clear space.  Patient was therefore brought back to the operating room on 12/02/2022 for placement of a syndesmotic screw for a diagnosis of  syndesmotic disruption of right ankle.  Patient was stable on 12/03/2022 and was prepared for discharge home.  He was given perioperative antibiotics:  Anti-infectives (From admission, onward)    Start     Dose/Rate Route Frequency Ordered Stop   12/02/22 0000  ceFAZolin (ANCEF) IVPB 2g/100 mL premix        2 g 200 mL/hr over  30 Minutes Intravenous 30 min pre-op 12/01/22 1508     12/01/22 1930  ceFAZolin (ANCEF) 2-4 GM/100ML-% IVPB       Note to Pharmacy: Marni Griffon K: cabinet override      12/01/22 1930 12/01/22 2003   12/01/22 1400  cefdinir (OMNICEF) capsule 300 mg        300 mg Oral 2 times daily 12/01/22 1229     12/01/22 1400  ceFAZolin (ANCEF) IVPB 2g/100 mL premix        2 g 200 mL/hr over 30 Minutes Intravenous Every 6 hours 12/01/22 1229 12/01/22 2004   12/01/22 0653  ceFAZolin (ANCEF) 2-4 GM/100ML-% IVPB       Note to Pharmacy: Jeanene Erb E:  cabinet override      12/01/22 0653 12/01/22 2004   12/01/22 0000  ceFAZolin (ANCEF) IVPB 2g/100 mL premix        2 g 200 mL/hr over 30 Minutes Intravenous 30 min pre-op 11/30/22 1512 12/01/22 0810   11/30/22 1955  tenofovir (VIREAD) tablet 300 mg        300 mg Oral Every evening 11/30/22 1949       .  He was given sequential compression devices, early ambulation, and lovenox for DVT prophylaxis.  He benefited maximally from the hospital stay and there were no complications.    Recent vital signs:  Vitals:   12/03/22 0547 12/03/22 1438  BP: 139/89 (!) 128/95  Pulse: 99 96  Resp: 18 18  Temp: 98.4 F (36.9 C) 98.6 F (37 C)  SpO2: 95% 93%    Recent laboratory studies:  Lab Results  Component Value Date   HGB 12.8 (L) 12/03/2022   HGB 12.8 (L) 12/02/2022   HGB 14.1 12/01/2022   Lab Results  Component Value Date   WBC 7.1 12/03/2022   PLT 143 (L) 12/03/2022   Lab Results  Component Value Date   INR 1.0 11/30/2022   Lab Results  Component Value Date   NA 132 (L) 12/03/2022   K 3.9 12/03/2022   CL 95 (L) 12/03/2022   CO2 28 12/03/2022   BUN 10 12/03/2022   CREATININE 0.99 12/03/2022   GLUCOSE 118 (H) 12/03/2022    Discharge Medications:   Allergies as of 12/03/2022       Reactions   Penicillins Anaphylaxis   Tolerates cefdinir, cephalexin and amoxicillin/clavulonate so true PCN allergy unlikely   Tetanus Toxoids Swelling   Lisinopril Cough   Losartan    Other reaction(s): Muscle Pain   Codeine Nausea Only, Nausea And Vomiting   Doxycycline Rash   Ruxience [rituximab-pvvr] Rash   05/06/21 pt w/ worsening rash during Ruxience infusion.  Face, neck and chest flushing redness        Medication List     STOP taking these medications    naltrexone 50 MG tablet Commonly known as: DEPADE   predniSONE 20 MG tablet Commonly known as: DELTASONE   testosterone cypionate 200 MG/ML injection Commonly known as: DEPOTESTOSTERONE CYPIONATE   traMADol 50 MG  tablet Commonly known as: ULTRAM       TAKE these medications    albuterol 108 (90 Base) MCG/ACT inhaler Commonly known as: VENTOLIN HFA Inhale 2 puffs into the lungs every 6 (six) hours as needed for wheezing or shortness of breath.   amLODipine 5 MG tablet Commonly known as: NORVASC Take 1 tablet (5 mg total) by mouth daily. What changed: when to take this   aspirin EC 325 MG tablet Take 1  tablet (325 mg total) by mouth 2 (two) times daily.   atorvastatin 80 MG tablet Commonly known as: LIPITOR Take 1 tablet (80 mg total) by mouth daily. What changed: when to take this   buPROPion 150 MG 12 hr tablet Commonly known as: WELLBUTRIN SR Take 150 mg by mouth 2 (two) times daily.   calcipotriene 0.005 % cream Commonly known as: DOVONOX Apply topically 2 (two) times daily. Apply twice a day after fluorouracil to affected areas as directed in handout. What changed:  how much to take when to take this   cefdinir 300 MG capsule Commonly known as: OMNICEF Take 1 capsule (300 mg total) by mouth 2 (two) times daily for 7 days.   clonazePAM 0.5 MG tablet Commonly known as: KLONOPIN Take 0.5 mg by mouth every evening.   DULoxetine 60 MG capsule Commonly known as: CYMBALTA Take 60 mg by mouth every evening.   famotidine 20 MG tablet Commonly known as: PEPCID Take 20 mg by mouth 2 (two) times daily.   finasteride 5 MG tablet Commonly known as: PROSCAR Take 1 tablet (5 mg total) by mouth daily. What changed: when to take this   fluorouracil 5 % cream Commonly known as: EFUDEX Apply topically 2 (two) times daily. Apply twice a day to affected areas as directed in handout. What changed:  how much to take when to take this   loratadine 10 MG tablet Commonly known as: CLARITIN Take 10 mg by mouth every evening.   metoprolol succinate 100 MG 24 hr tablet Commonly known as: TOPROL-XL Take 100 mg by mouth daily.   mirtazapine 15 MG tablet Commonly known as:  REMERON Take 15 mg by mouth at bedtime.   montelukast 10 MG tablet Commonly known as: SINGULAIR Take 10 mg by mouth daily.   mupirocin ointment 2 % Commonly known as: BACTROBAN Apply 1 Application topically daily. Apply to any open wounds until healed.   nicotine 21 mg/24hr patch Commonly known as: NICODERM CQ - dosed in mg/24 hours Place 1 patch (21 mg total) onto the skin daily.   OLANZapine 5 MG tablet Commonly known as: ZYPREXA Take 5 mg by mouth at bedtime.   oxyCODONE 5 MG immediate release tablet Commonly known as: Oxy IR/ROXICODONE Take 1 tablet (5 mg total) by mouth every 4 (four) hours as needed for severe pain.   sildenafil 20 MG tablet Commonly known as: REVATIO TAKE 3-5 TABLETS BY MOUTH ONCE DAILY AS NEEDED FOR ERECTILE DYSFUNCTION What changed: See the new instructions.   tenofovir 300 MG tablet Commonly known as: VIREAD Take 300 mg by mouth every evening.   Trelegy Ellipta 100-62.5-25 MCG/ACT Aepb Generic drug: Fluticasone-Umeclidin-Vilant Inhale 1 puff into the lungs daily.               Durable Medical Equipment  (From admission, onward)           Start     Ordered   12/03/22 1130  For home use only DME lightweight manual wheelchair with seat cushion  Once       Comments: Patient suffers from ankle fracture which impairs their ability to perform daily activities like bathing, dressing, and toileting in the home.  A walker will not resolve  issue with performing activities of daily living. A wheelchair will allow patient to safely perform daily activities. Patient is not able to propel themselves in the home using a standard weight wheelchair due to endurance and general weakness. Patient can self propel in the lightweight  wheelchair. Length of need 6 months . Accessories: elevating leg rests (ELRs), wheel locks, extensions and anti-tippers.   12/03/22 1129            Diagnostic Studies: DG Ankle Complete Right  Result Date:  12/02/2022 CLINICAL DATA:  Postop EXAM: RIGHT ANKLE - COMPLETE 3+ VIEW COMPARISON:  11/30/2022 FINDINGS: Interval surgical plate and multiple screw fixation of the distal fibula with long fixating screw across the distal fibula and tibia for trimalleolar fracture. There is anatomic alignment. Reduction of previously noted laterally displaced talar dome. IMPRESSION: Interval surgical fixation of trimalleolar fracture with anatomic alignment. Electronically Signed   By: Donavan Foil M.D.   On: 12/02/2022 15:25   DG MINI C-ARM IMAGE ONLY  Result Date: 12/02/2022 There is no interpretation for this exam.  This order is for images obtained during a surgical procedure.  Please See "Surgeries" Tab for more information regarding the procedure.   DG Ankle Right Port  Result Date: 12/01/2022 CLINICAL DATA:  ORIF fibular fracture EXAM: PORTABLE RIGHT ANKLE - 3 VIEW COMPARISON:  11/30/2022 FINDINGS: Overlying cast obscures some detail. Internal plate and screw fixation of the distal fibula is noted, traversing a distal fibular fracture which is in near anatomic alignment and position. Tibial fractures are difficult to visualized on this study. Mild widening of the MEDIAL tibiotalar joint is again noted. IMPRESSION: ORIF distal fibular fracture. Electronically Signed   By: Margarette Canada M.D.   On: 12/01/2022 12:39   DG MINI C-ARM IMAGE ONLY  Result Date: 12/01/2022 There is no interpretation for this exam.  This order is for images obtained during a surgical procedure.  Please See "Surgeries" Tab for more information regarding the procedure.   DG Tibia/Fibula Right  Result Date: 11/30/2022 CLINICAL DATA:  Ankle fracture. EXAM: RIGHT TIBIA AND FIBULA - 2 VIEW COMPARISON:  Right ankle radiographs 11/30/2022 (multiple studies) FINDINGS: Normal bone mineralization. Overlying splint again limits evaluation of fine bony detail the distal tibia, fibula, and ankle. Redemonstration of oblique fracture of the distal fibular  diaphysis and metaphysis with unchanged mild posterior displacement of the distal fracture component. There is slightly improved but persistent approximately 10 mm widening of the medial clear space. There is narrowing of the lateral tibiotalar joint space on frontal view. A small ossicle is again seen just medial to the talus, presumably avulsion from the distal medial malleolus where there is a similarly sized donor site. Mildly moderately displaced posterior malleolar fracture is unchanged. Minimal superior patellar degenerative osteophytosis. No additional acute fracture is seen within the more proximal tibia or fibula. IMPRESSION: Redemonstration of distal fibula, posterior malleolar, and distal medial malleolar fractures with persistent widening of the medial tibiotalar clear space. No additional more proximal acute fracture is seen. Electronically Signed   By: Yvonne Kendall M.D.   On: 11/30/2022 16:23   DG Ankle Complete Right  Result Date: 11/30/2022 CLINICAL DATA:  Splinting of the right ankle. EXAM: RIGHT ANKLE - COMPLETE 3+ VIEW COMPARISON:  Right ankle radiographs 11/30/2022 at 6:14 a.m. FINDINGS: Fiberglass splint is now in place posteriorly. Oblique fracture of the distal fibula and avulsion fracture of the medial malleolus are stable. Lightening of the tibiotalar space is stable. Posterior tibial fracture is better visualized on these images. IMPRESSION: 1. Interval placement of fiberglass splint. 2. Stable appearance of distal fibular and medial malleolar fractures. 3. A posterior tibial fracture is also present, mildly displaced. Electronically Signed   By: San Morelle M.D.   On: 11/30/2022 13:17  DG Chest 2 View  Result Date: 11/30/2022 CLINICAL DATA:  62 year old male with history of cough. EXAM: CHEST - 2 VIEW COMPARISON:  Chest x-ray 11/01/2022. FINDINGS: Right internal jugular single-lumen Port-A-Cath with tip terminating in the proximal superior vena cava. Lung volumes are  normal. Mild diffuse interstitial prominence and widespread peribronchial cuffing, stable compared to prior studies. No consolidative airspace disease. No pleural effusions. No pneumothorax. No pulmonary nodule or mass noted. Pulmonary vasculature and the cardiomediastinal silhouette are within normal limits. IMPRESSION: 1. Diffuse interstitial prominence and peribronchial cuffing, chronic and similar to prior studies, likely reflecting chronic bronchitis. No acute findings. Electronically Signed   By: Vinnie Langton M.D.   On: 11/30/2022 07:48   DG Ankle Complete Right  Result Date: 11/30/2022 CLINICAL DATA:  62 year old male status post fall. Pain and swelling. EXAM: RIGHT ANKLE - COMPLETE 3+ VIEW COMPARISON:  None Available. FINDINGS: Acute spiral fracture of the distal right fibula metadiaphysis. Associated avulsion fracture at the tip of the medial malleolus. And lateral dislocation of the right mortise joint by about 50%. Posterior malleolus and anterior to posterior alignment appear relatively maintained on the lateral view. No definite joint effusion. Calcaneus and talar dome intact. IMPRESSION: Acute right ankle lateral fracture-dislocation. - spiral fracture of the distal right fibula metadiaphysis. - small avulsion fracture of the medial malleolus. Electronically Signed   By: Genevie Ann M.D.   On: 11/30/2022 06:44    Disposition: Discharge disposition: 01-Home or Self Care       Discharge Instructions     Call MD / Call 911   Complete by: As directed    If you experience chest pain or shortness of breath, CALL 911 and be transported to the hospital emergency room.  If you develope a fever above 101 F, pus (white drainage) or increased drainage or redness at the wound, or calf pain, call your surgeon's office.   Constipation Prevention   Complete by: As directed    Drink plenty of fluids.  Prune juice may be helpful.  You may use a stool softener, such as Colace (over the counter) 100 mg  twice a day.  Use MiraLax (over the counter) for constipation as needed.   Diet general   Complete by: As directed    Discharge instructions   Complete by: As directed    The patient may contact [facility] in [place] at [phone] with any questions or concerns.  The urgent care at [facility] in [place] is open 9 AM to 9 PM daily for urgent evaluation.  The patient will keep his splint and dressings on until follow-up in 10 to 14 days at [facility] in [place].  He should cover his splint and dressings with a plastic bag for showering.  Patient needs to continue strict elevation of the right lower extremity for the next 1 to 2 weeks to reduce swelling.  The patient is nonweightbearing on the right lower extremity for a total of 6 to 8 weeks postoperatively.  Patient will use a walker and a wheelchair to assist with his nonweightbearing restriction.  Patient will be ordered for home health PT and OT.   Driving restrictions   Complete by: As directed    No driving for 6-8 weeks   Increase activity slowly as tolerated   Complete by: As directed    Lifting restrictions   Complete by: As directed    No lifting for 8-12 weeks   Post-operative opioid taper instructions:   Complete by: As directed  POST-OPERATIVE OPIOID TAPER INSTRUCTIONS: It is important to wean off of your opioid medication as soon as possible. If you do not need pain medication after your surgery it is ok to stop day one. Opioids include: Codeine, Hydrocodone(Norco, Vicodin), Oxycodone(Percocet, oxycontin) and hydromorphone amongst others.  Long term and even short term use of opiods can cause: Increased pain response Dependence Constipation Depression Respiratory depression And more.  Withdrawal symptoms can include Flu like symptoms Nausea, vomiting And more Techniques to manage these symptoms Hydrate well Eat regular healthy meals Stay active Use relaxation techniques(deep breathing,  meditating, yoga) Do Not substitute Alcohol to help with tapering If you have been on opioids for less than two weeks and do not have pain than it is ok to stop all together.  Plan to wean off of opioids This plan should start within one week post op of your joint replacement. Maintain the same interval or time between taking each dose and first decrease the dose.  Cut the total daily intake of opioids by one tablet each day Next start to increase the time between doses. The last dose that should be eliminated is the evening dose.           Follow-up Information     Thornton Park, MD Follow up in 2 week(s).   Specialty: Orthopedic Surgery Why: 12/16/22 '@1000'$  Contact information: [place] [address] [phone]                  Signed: Thornton Park ,MD 12/03/2022, 5:54 PM

## 2022-12-03 NOTE — Discharge Instructions (Addendum)
(  For wheelchair return) [employer] [address] [place]ina [place], [place]  [address] YOU MUST TAKE ASPIRIN '325MG'$  TWICE A DAY TO PREVENT BLOOD CLOTS

## 2022-12-03 NOTE — Anesthesia Postprocedure Evaluation (Signed)
Anesthesia Post Note  Patient: Russell Simpson  Procedure(s) Performed: OPEN REDUCTION INTERNAL FIXATION (ORIF) ANKLE FRACTURE (Right: Ankle)  Patient location during evaluation: Nursing Unit Anesthesia Type: Spinal Level of consciousness: awake, awake and alert and oriented Pain management: pain level controlled Vital Signs Assessment: post-procedure vital signs reviewed and stable Respiratory status: spontaneous breathing, nonlabored ventilation and respiratory function stable Cardiovascular status: blood pressure returned to baseline and stable Postop Assessment: no headache and no backache Anesthetic complications: no  No notable events documented.   Last Vitals:  Vitals:   12/02/22 1957 12/03/22 0547  BP: 123/87 139/89  Pulse: 97 99  Resp: 18 18  Temp: 36.8 C 36.9 C  SpO2: 95% 95%    Last Pain:  Vitals:   12/03/22 0900  TempSrc:   PainSc: 9                  [employer]

## 2022-12-03 NOTE — Progress Notes (Cosign Needed)
Patient suffers from ankle fracture which impairs their ability to perform daily activities like bathing, dressing, and toileting in the home.  A walker will not resolve issue with performing activities of daily living. A wheelchair will allow patient to safely perform daily activities. Patient is not able to propel themselves in the home using a standard weight wheelchair due to endurance and general weakness. Patient can self propel in the lightweight wheelchair. Length of need 6 months . Accessories: elevating leg rests (ELRs), wheel locks, extensions and anti-tippers.

## 2022-12-03 NOTE — Evaluation (Signed)
Physical Therapy Evaluation Patient Details Name: Russell Simpson MRN: [id number] DOB: 12-Nov-1961 Today's Date: 12/03/2022  History of Present Illness  Russell Simpson is a 62 y.o. male with medical history significant of hypertension, hyperlipidemia, inappropriate sinus tachycardia, COPD, stage IV follicular lymphoma s/p chemotherapy, idiopathic hyperprolactinemia, bipolar disorder, panic disorder who presents to the ED after a ground-level fall with ankle pain. Found to have RIGHT TRIMALLEOLAR ANKLE FRACTURE EQUIVALENT, underwent ORIF on 12/01/22 and ORIF R ankle syndesmotic disruption on 12/02/22.  Clinical Impression  Pt is a pleasant 62 year old male who was admitted for trimalleolar fx, s/p ORIF on 12/01/22 and then again on 12/02/22. Pt performs transfers/ambulation with min assist and RW. Pt able to maintain NWB. Pt demonstrates deficits with strength/mobility/pain. Educated and performed stair training. DME discussed via care team.  Would benefit from skilled PT to address above deficits and promote optimal return to PLOF. Recommend transition to [place] upon discharge from acute hospitalization.      Recommendations for follow up therapy are one component of a multi-disciplinary discharge planning process, led by the attending physician.  Recommendations may be updated based on patient status, additional functional criteria and insurance authorization.  Follow Up Recommendations Home health PT      Assistance Recommended at Discharge Frequent or constant Supervision/Assistance  Patient can return home with the following  A lot of help with walking and/or transfers;A lot of help with bathing/dressing/bathroom;Help with stairs or ramp for entrance;Assist for transportation    Equipment Recommendations Rolling walker (2 wheels);Wheelchair (measurements PT)  Recommendations for Other Services       Functional Status Assessment Patient has had a recent decline in their functional status and  demonstrates the ability to make significant improvements in function in a reasonable and predictable amount of time.     Precautions / Restrictions Precautions Precautions: Fall Restrictions Weight Bearing Restrictions: Yes RLE Weight Bearing: Non weight bearing      Mobility  Bed Mobility               General bed mobility comments: NT, received in seated position    Transfers Overall transfer level: Needs assistance Equipment used: Rolling walker (2 wheels) Transfers: Sit to/from Stand Sit to Stand: Min assist           General transfer comment: needs cues for pushing from seated surface. Once standing, good balance noted    Ambulation/Gait Ambulation/Gait assistance: Min assist Gait Distance (Feet): 10 Feet Assistive device: Rolling walker (2 wheels) Gait Pattern/deviations: Step-to pattern       General Gait Details: hop to gait pattern with RW and slightly unsteadiness. Cues for safety  Stairs Stairs: Yes Stairs assistance: Mod assist, +2 physical assistance Stair Management: No rails, Seated/boosting Number of Stairs: 3 General stair comments: demonstrated hopping using RW, however pt reported not feeling safe to hop up stairs therefore was agreeable to boosting up stairs. +2 for transfer on/off floor.  Wheelchair Mobility    Modified Rankin (Stroke Patients Only)       Balance Overall balance assessment: Needs assistance Sitting-balance support: No upper extremity supported, Feet supported Sitting balance-Leahy Scale: Normal     Standing balance support: During functional activity, Bilateral upper extremity supported Standing balance-Leahy Scale: Fair                               Pertinent Vitals/Pain Pain Assessment Pain Assessment: 0-10 Pain Score: 10-Worst pain ever Pain Location: R ankle  Pain Descriptors / Indicators: Discomfort, Grimacing Pain Intervention(s): Limited activity within patient's tolerance,  Repositioned    Home Living Family/patient expects to be discharged to:: Private residence Living Arrangements: Alone Available Help at Discharge: Friend(s);Available PRN/intermittently Type of Home: House Home Access: Stairs to enter Entrance Stairs-Rails: None Entrance Stairs-Number of Steps: 3   Home Layout: One level Home Equipment:  (knee scooter)      Prior Function Prior Level of Function : Independent/Modified Independent;Driving             Mobility Comments: 2 falls in last 3 months ADLs Comments: indep     Hand Dominance        Extremity/Trunk Assessment   Upper Extremity Assessment Upper Extremity Assessment: Overall WFL for tasks assessed    Lower Extremity Assessment Lower Extremity Assessment: Generalized weakness (R LE grossly 3+/5)       Communication   Communication: No difficulties  Cognition Arousal/Alertness: Awake/alert Behavior During Therapy: WFL for tasks assessed/performed, Impulsive Overall Cognitive Status: Within Functional Limits for tasks assessed                                 General Comments: slightly impulsive with activity        General Comments      Exercises Other Exercises Other Exercises: educated friend at bed side on safe transfers in/out of house. Educated on [employer] status   Assessment/Plan    PT Assessment Patient needs continued PT services  PT Problem List Decreased strength;Decreased balance;Decreased mobility;Pain       PT Treatment Interventions DME instruction;Gait training;Stair training;Therapeutic exercise;Balance training    PT Goals (Current goals can be found in the Care Plan section)  Acute Rehab PT Goals Patient Stated Goal: to go home PT Goal Formulation: With patient Time For Goal Achievement: 12/17/22 Potential to Achieve Goals: Good    Frequency 7X/week     Co-evaluation               AM-PAC PT "6 Clicks" Mobility  Outcome Measure Help needed turning  from your back to your side while in a flat bed without using bedrails?: A Little Help needed moving from lying on your back to sitting on the side of a flat bed without using bedrails?: A Little Help needed moving to and from a bed to a chair (including a wheelchair)?: A Little Help needed standing up from a chair using your arms (e.g., wheelchair or bedside chair)?: A Little Help needed to walk in hospital room?: A Little Help needed climbing 3-5 steps with a railing? : A Lot 6 Click Score: 17    End of Session Equipment Utilized During Treatment: Gait belt Activity Tolerance: Patient limited by pain Patient left: in chair Nurse Communication: Mobility status PT Visit Diagnosis: Pain;Unsteadiness on feet (R26.81);Muscle weakness (generalized) (M62.81);Difficulty in walking, not elsewhere classified (R26.2);History of falling (Z91.81) Pain - Right/Left: Right Pain - part of body: Ankle and joints of foot    Time: 6789-3810 PT Time Calculation (min) (ACUTE ONLY): 25 min   Charges:   PT Evaluation $PT Eval Low Complexity: 1 Low PT Treatments $Gait Training: 8-22 mins        Greggory Stallion, PT, DPT, GCS [phone]   Eddith Mentor 12/03/2022, 1:45 PM

## 2022-12-03 NOTE — Progress Notes (Signed)
Subjective:  POD #1/2 s/p ORIF left ankle fracture/placement of syndesmotic screw.   Patient reports left lateral ankle pain as moderate.    Objective:   VITALS:   Vitals:   12/02/22 1650 12/02/22 1710 12/02/22 1957 12/03/22 0547  BP:  108/69 123/87 139/89  Pulse: 90 90 97 99  Resp: '16 18 18 18  '$ Temp:  (!) 97.4 F (36.3 C) 98.2 F (36.8 C) 98.4 F (36.9 C)  TempSrc:   Oral   SpO2: 94% 96% 95% 95%  Weight:      Height:        PHYSICAL EXAM: Left lower extremity: Patient's AO splint is in place.  He can flex and extend his toes and has intact sensation light touch in all 5 toes.  Patient's toes are well-perfused.   LABS  Results for orders placed or performed during the hospital encounter of 11/30/22 (from the past 24 hour(s))  Basic metabolic panel     Status: Abnormal   Collection Time: 12/03/22  3:03 AM  Result Value Ref Range   Sodium 132 (L) 135 - 145 mmol/L   Potassium 3.9 3.5 - 5.1 mmol/L   Chloride 95 (L) 98 - 111 mmol/L   CO2 28 22 - 32 mmol/L   Glucose, Bld 118 (H) 70 - 99 mg/dL   BUN 10 6 - 20 mg/dL   Creatinine, Ser 0.99 0.61 - 1.24 mg/dL   Calcium 8.4 (L) 8.9 - 10.3 mg/dL   GFR, Estimated >60 >60 mL/min   Anion gap 9 5 - 15  CBC     Status: Abnormal   Collection Time: 12/03/22  3:03 AM  Result Value Ref Range   WBC 7.1 4.0 - 10.5 K/uL   RBC 3.96 (L) 4.22 - 5.81 MIL/uL   Hemoglobin 12.8 (L) 13.0 - 17.0 g/dL   HCT 38.2 (L) 39.0 - 52.0 %   MCV 96.5 80.0 - 100.0 fL   MCH 32.3 26.0 - 34.0 pg   MCHC 33.5 30.0 - 36.0 g/dL   RDW 12.7 11.5 - 15.5 %   Platelets 143 (L) 150 - 400 K/uL   nRBC 0.0 0.0 - 0.2 %  Hepatic function panel     Status: Abnormal   Collection Time: 12/03/22  3:03 AM  Result Value Ref Range   Total Protein 6.1 (L) 6.5 - 8.1 g/dL   Albumin 3.5 3.5 - 5.0 g/dL   AST 25 15 - 41 U/L   ALT 26 0 - 44 U/L   Alkaline Phosphatase 139 (H) 38 - 126 U/L   Total Bilirubin 0.9 0.3 - 1.2 mg/dL   Bilirubin, Direct 0.2 0.0 - 0.2 mg/dL   Indirect  Bilirubin 0.7 0.3 - 0.9 mg/dL  Magnesium     Status: None   Collection Time: 12/03/22  3:03 AM  Result Value Ref Range   Magnesium 1.7 1.7 - 2.4 mg/dL  Phosphorus     Status: None   Collection Time: 12/03/22  3:03 AM  Result Value Ref Range   Phosphorus 3.7 2.5 - 4.6 mg/dL    DG Ankle Complete Right  Result Date: 12/02/2022 CLINICAL DATA:  Postop EXAM: RIGHT ANKLE - COMPLETE 3+ VIEW COMPARISON:  11/30/2022 FINDINGS: Interval surgical plate and multiple screw fixation of the distal fibula with long fixating screw across the distal fibula and tibia for trimalleolar fracture. There is anatomic alignment. Reduction of previously noted laterally displaced talar dome. IMPRESSION: Interval surgical fixation of trimalleolar fracture with anatomic alignment. Electronically Signed  By: Donavan Foil M.D.   On: 12/02/2022 15:25   DG MINI C-ARM IMAGE ONLY  Result Date: 12/02/2022 There is no interpretation for this exam.  This order is for images obtained during a surgical procedure.  Please See "Surgeries" Tab for more information regarding the procedure.    Assessment/Plan: 1 Day Post-Op   Principal Problem:   Closed right ankle fracture Active Problems:   Alcohol use disorder, severe, dependence (HCC)   Chronic diastolic CHF (congestive heart failure) (HCC)   Hyponatremia   Abnormal LFTs   HTN (hypertension)   High anion gap metabolic acidosis   Ankle fracture   COPD (chronic obstructive pulmonary disease) ([place])  Patient will be discharged home with home health services including PT and OT.  Patient has been given a wheelchair.  He will also use a walker for assistance with ambulation.  Patient is nonweightbearing on the right lower extremity for 6 to 8 weeks postop.  Will need to continue strict elevation of the right lower extremity at home.  Patient will take enteric-coated aspirin 325 mg by mouth once a day for DVT prophylaxis.    Thornton Park , MD 12/03/2022, 1:28 PM

## 2022-12-04 ENCOUNTER — Telehealth: Payer: Self-pay | Admitting: *Deleted

## 2022-12-04 NOTE — Anesthesia Postprocedure Evaluation (Signed)
Anesthesia Post Note  Patient: Russell Simpson  Procedure(s) Performed: OPEN REDUCTION INTERNAL FIXATION (ORIF) ANKLE FRACTURE (Right: Ankle)  Patient location during evaluation: PACU Anesthesia Type: Spinal Level of consciousness: awake Pain management: satisfactory to patient Vital Signs Assessment: post-procedure vital signs reviewed and stable Respiratory status: spontaneous breathing Cardiovascular status: stable Anesthetic complications: no  No notable events documented.   Last Vitals:  Vitals:   12/03/22 0547 12/03/22 1438  BP: 139/89 (!) 128/95  Pulse: 99 96  Resp: 18 18  Temp: 36.9 C 37 C  SpO2: 95% 93%    Last Pain:  Vitals:   12/03/22 1438  TempSrc: Temporal  PainSc: 2                  VAN STAVEREN,Seena Ritacco

## 2022-12-04 NOTE — Patient Outreach (Signed)
  Care Coordination [facility] Note Transition Care Management Unsuccessful Follow-up Telephone Call  Date of discharge and from where:  [id number] [facility]   Attempts:  2nd Attempt  Reason for unsuccessful TCM follow-up call:  Left voice message  [place] Care Management [phone]

## 2022-12-04 NOTE — Patient Outreach (Signed)
  Care Coordination [facility] Note Transition Care Management Unsuccessful Follow-up Telephone Call  Date of discharge and from where:  [id number] [facility]  Attempts:  1st Attempt  Reason for unsuccessful TCM follow-up call:  Left voice message   [place] Care Management [phone]

## 2022-12-07 ENCOUNTER — Telehealth: Payer: Self-pay | Admitting: *Deleted

## 2022-12-07 DIAGNOSIS — I110 Hypertensive heart disease with heart failure: Secondary | ICD-10-CM | POA: Diagnosis not present

## 2022-12-07 DIAGNOSIS — S8263XD Displaced fracture of lateral malleolus of unspecified fibula, subsequent encounter for closed fracture with routine healing: Secondary | ICD-10-CM | POA: Diagnosis not present

## 2022-12-07 DIAGNOSIS — I4711 Inappropriate sinus tachycardia, so stated: Secondary | ICD-10-CM | POA: Diagnosis not present

## 2022-12-07 DIAGNOSIS — E8721 Acute metabolic acidosis: Secondary | ICD-10-CM | POA: Diagnosis not present

## 2022-12-07 DIAGNOSIS — I5032 Chronic diastolic (congestive) heart failure: Secondary | ICD-10-CM | POA: Diagnosis not present

## 2022-12-07 DIAGNOSIS — E871 Hypo-osmolality and hyponatremia: Secondary | ICD-10-CM | POA: Diagnosis not present

## 2022-12-07 DIAGNOSIS — G894 Chronic pain syndrome: Secondary | ICD-10-CM | POA: Diagnosis not present

## 2022-12-07 DIAGNOSIS — G4730 Sleep apnea, unspecified: Secondary | ICD-10-CM | POA: Diagnosis not present

## 2022-12-07 DIAGNOSIS — S82851D Displaced trimalleolar fracture of right lower leg, subsequent encounter for closed fracture with routine healing: Secondary | ICD-10-CM | POA: Diagnosis not present

## 2022-12-07 NOTE — Patient Outreach (Signed)
  Care Coordination [facility] Note Transition Care Management Follow-up Telephone Call Date of discharge and from where: [facility] [id number] [place] [facility] How have you been since you were released from the hospital? I am still hurting. My pain medication is not working.  Any questions or concerns? Yes  Items Reviewed: Did the pt receive and understand the discharge instructions provided? Yes  Medications obtained and verified? Yes  Other? No  Any new allergies since your discharge? No  Dietary orders reviewed? No Do you have support at home? Yes   Home Care and Equipment/Supplies: Were home health services ordered? Yes PT/ OT If so, what is the name of the agency? Patient didn't remember  Has the agency set up a time to come to the patient's home? yes Were any new equipment or medical supplies ordered?  Yes wheelchair What is the name of the medical supply agency? [employer]  Were you able to get the supplies/equipment? yes Do you have any questions related to the use of the equipment or supplies? No  Functional Questionnaire: (I = Independent and D = Dependent) ADLs: D  Bathing/Dressing- I  Meal Prep- D  Eating- I  Maintaining continence- I  Transferring/Ambulation- D  Managing Meds- I  Follow up appointments reviewed:  PCP Hospital f/u appt confirmed? No   Specialist Hospital f/u appt confirmed? Yes  Dr Mack Guise [phone] 10:00. Are transportation arrangements needed?  y If their condition worsens, is the pt aware to call PCP or go to the Emergency Dept.? Yes Was the patient provided with contact information for the PCP's office or ED? Yes Was to pt encouraged to call back with questions or concerns? Yes  SDOH assessments and interventions completed:   Yes SDOH Interventions Today    Flowsheet Row Most Recent Value  SDOH Interventions   Food Insecurity Interventions Intervention Not Indicated  Housing Interventions Intervention Not Indicated  Transportation Interventions  Intervention Not Indicated       Care Coordination Interventions:  Patient stated that his pain was not being controlled. RN told patient to call Dr Mack Guise and make him aware. Keep leg elevated to reduce swelling and use walker or wheelchair.     Encounter Outcome:  Pt. Visit Completed    [place] [facility] [phone]

## 2022-12-09 DIAGNOSIS — I5032 Chronic diastolic (congestive) heart failure: Secondary | ICD-10-CM | POA: Diagnosis not present

## 2022-12-09 DIAGNOSIS — I4711 Inappropriate sinus tachycardia, so stated: Secondary | ICD-10-CM | POA: Diagnosis not present

## 2022-12-09 DIAGNOSIS — E871 Hypo-osmolality and hyponatremia: Secondary | ICD-10-CM | POA: Diagnosis not present

## 2022-12-09 DIAGNOSIS — E8721 Acute metabolic acidosis: Secondary | ICD-10-CM | POA: Diagnosis not present

## 2022-12-09 DIAGNOSIS — I110 Hypertensive heart disease with heart failure: Secondary | ICD-10-CM | POA: Diagnosis not present

## 2022-12-09 DIAGNOSIS — S8263XD Displaced fracture of lateral malleolus of unspecified fibula, subsequent encounter for closed fracture with routine healing: Secondary | ICD-10-CM | POA: Diagnosis not present

## 2022-12-09 DIAGNOSIS — S82851D Displaced trimalleolar fracture of right lower leg, subsequent encounter for closed fracture with routine healing: Secondary | ICD-10-CM | POA: Diagnosis not present

## 2022-12-09 DIAGNOSIS — G4730 Sleep apnea, unspecified: Secondary | ICD-10-CM | POA: Diagnosis not present

## 2022-12-09 DIAGNOSIS — G894 Chronic pain syndrome: Secondary | ICD-10-CM | POA: Diagnosis not present

## 2022-12-10 ENCOUNTER — Encounter: Payer: Self-pay | Admitting: Orthopedic Surgery

## 2022-12-11 DIAGNOSIS — E8721 Acute metabolic acidosis: Secondary | ICD-10-CM | POA: Diagnosis not present

## 2022-12-11 DIAGNOSIS — G4730 Sleep apnea, unspecified: Secondary | ICD-10-CM | POA: Diagnosis not present

## 2022-12-11 DIAGNOSIS — I4711 Inappropriate sinus tachycardia, so stated: Secondary | ICD-10-CM | POA: Diagnosis not present

## 2022-12-11 DIAGNOSIS — G894 Chronic pain syndrome: Secondary | ICD-10-CM | POA: Diagnosis not present

## 2022-12-11 DIAGNOSIS — E871 Hypo-osmolality and hyponatremia: Secondary | ICD-10-CM | POA: Diagnosis not present

## 2022-12-11 DIAGNOSIS — I5032 Chronic diastolic (congestive) heart failure: Secondary | ICD-10-CM | POA: Diagnosis not present

## 2022-12-11 DIAGNOSIS — S8263XD Displaced fracture of lateral malleolus of unspecified fibula, subsequent encounter for closed fracture with routine healing: Secondary | ICD-10-CM | POA: Diagnosis not present

## 2022-12-11 DIAGNOSIS — S82851D Displaced trimalleolar fracture of right lower leg, subsequent encounter for closed fracture with routine healing: Secondary | ICD-10-CM | POA: Diagnosis not present

## 2022-12-11 DIAGNOSIS — I110 Hypertensive heart disease with heart failure: Secondary | ICD-10-CM | POA: Diagnosis not present

## 2022-12-14 ENCOUNTER — Telehealth: Payer: Self-pay | Admitting: *Deleted

## 2022-12-14 NOTE — Patient Outreach (Signed)
  Care Coordination   12/14/2022 Name: Russell Simpson MRN: [id number] DOB: 1961/07/07   Care Coordination Outreach Attempts:  An unsuccessful telephone outreach was attempted today to offer the patient information about available care coordination services as a benefit of their health plan.   Follow Up Plan:  Additional outreach attempts will be made to offer the patient care coordination information and services.   Encounter Outcome:  No Answer   Care Coordination Interventions:  No, not indicated    Valente David, RN, MSN, [facility] Care Management Care Management Coordinator [phone]

## 2022-12-15 DIAGNOSIS — G894 Chronic pain syndrome: Secondary | ICD-10-CM | POA: Diagnosis not present

## 2022-12-15 DIAGNOSIS — S8263XD Displaced fracture of lateral malleolus of unspecified fibula, subsequent encounter for closed fracture with routine healing: Secondary | ICD-10-CM | POA: Diagnosis not present

## 2022-12-15 DIAGNOSIS — E871 Hypo-osmolality and hyponatremia: Secondary | ICD-10-CM | POA: Diagnosis not present

## 2022-12-15 DIAGNOSIS — I4711 Inappropriate sinus tachycardia, so stated: Secondary | ICD-10-CM | POA: Diagnosis not present

## 2022-12-15 DIAGNOSIS — E8721 Acute metabolic acidosis: Secondary | ICD-10-CM | POA: Diagnosis not present

## 2022-12-15 DIAGNOSIS — I110 Hypertensive heart disease with heart failure: Secondary | ICD-10-CM | POA: Diagnosis not present

## 2022-12-15 DIAGNOSIS — I5032 Chronic diastolic (congestive) heart failure: Secondary | ICD-10-CM | POA: Diagnosis not present

## 2022-12-15 DIAGNOSIS — G4730 Sleep apnea, unspecified: Secondary | ICD-10-CM | POA: Diagnosis not present

## 2022-12-15 DIAGNOSIS — S82851D Displaced trimalleolar fracture of right lower leg, subsequent encounter for closed fracture with routine healing: Secondary | ICD-10-CM | POA: Diagnosis not present

## 2022-12-16 DIAGNOSIS — G894 Chronic pain syndrome: Secondary | ICD-10-CM | POA: Diagnosis not present

## 2022-12-16 DIAGNOSIS — S82851A Displaced trimalleolar fracture of right lower leg, initial encounter for closed fracture: Secondary | ICD-10-CM | POA: Diagnosis not present

## 2022-12-16 DIAGNOSIS — I4711 Inappropriate sinus tachycardia, so stated: Secondary | ICD-10-CM | POA: Diagnosis not present

## 2022-12-16 DIAGNOSIS — E8721 Acute metabolic acidosis: Secondary | ICD-10-CM | POA: Diagnosis not present

## 2022-12-16 DIAGNOSIS — I5032 Chronic diastolic (congestive) heart failure: Secondary | ICD-10-CM | POA: Diagnosis not present

## 2022-12-16 DIAGNOSIS — G4730 Sleep apnea, unspecified: Secondary | ICD-10-CM | POA: Diagnosis not present

## 2022-12-16 DIAGNOSIS — E871 Hypo-osmolality and hyponatremia: Secondary | ICD-10-CM | POA: Diagnosis not present

## 2022-12-16 DIAGNOSIS — S82851D Displaced trimalleolar fracture of right lower leg, subsequent encounter for closed fracture with routine healing: Secondary | ICD-10-CM | POA: Diagnosis not present

## 2022-12-16 DIAGNOSIS — S8263XD Displaced fracture of lateral malleolus of unspecified fibula, subsequent encounter for closed fracture with routine healing: Secondary | ICD-10-CM | POA: Diagnosis not present

## 2022-12-16 DIAGNOSIS — I110 Hypertensive heart disease with heart failure: Secondary | ICD-10-CM | POA: Diagnosis not present

## 2022-12-17 ENCOUNTER — Telehealth: Payer: Self-pay | Admitting: *Deleted

## 2022-12-17 DIAGNOSIS — I5032 Chronic diastolic (congestive) heart failure: Secondary | ICD-10-CM | POA: Diagnosis not present

## 2022-12-17 DIAGNOSIS — I4711 Inappropriate sinus tachycardia, so stated: Secondary | ICD-10-CM | POA: Diagnosis not present

## 2022-12-17 DIAGNOSIS — I110 Hypertensive heart disease with heart failure: Secondary | ICD-10-CM | POA: Diagnosis not present

## 2022-12-17 DIAGNOSIS — S8263XD Displaced fracture of lateral malleolus of unspecified fibula, subsequent encounter for closed fracture with routine healing: Secondary | ICD-10-CM | POA: Diagnosis not present

## 2022-12-17 DIAGNOSIS — G4730 Sleep apnea, unspecified: Secondary | ICD-10-CM | POA: Diagnosis not present

## 2022-12-17 DIAGNOSIS — S82851D Displaced trimalleolar fracture of right lower leg, subsequent encounter for closed fracture with routine healing: Secondary | ICD-10-CM | POA: Diagnosis not present

## 2022-12-17 DIAGNOSIS — G894 Chronic pain syndrome: Secondary | ICD-10-CM | POA: Diagnosis not present

## 2022-12-17 DIAGNOSIS — E871 Hypo-osmolality and hyponatremia: Secondary | ICD-10-CM | POA: Diagnosis not present

## 2022-12-17 DIAGNOSIS — E8721 Acute metabolic acidosis: Secondary | ICD-10-CM | POA: Diagnosis not present

## 2022-12-18 DIAGNOSIS — F3162 Bipolar disorder, current episode mixed, moderate: Secondary | ICD-10-CM | POA: Diagnosis not present

## 2022-12-18 DIAGNOSIS — F410 Panic disorder [episodic paroxysmal anxiety] without agoraphobia: Secondary | ICD-10-CM | POA: Diagnosis not present

## 2022-12-18 DIAGNOSIS — F1010 Alcohol abuse, uncomplicated: Secondary | ICD-10-CM | POA: Diagnosis not present

## 2022-12-21 DIAGNOSIS — G4730 Sleep apnea, unspecified: Secondary | ICD-10-CM | POA: Diagnosis not present

## 2022-12-21 DIAGNOSIS — S82851D Displaced trimalleolar fracture of right lower leg, subsequent encounter for closed fracture with routine healing: Secondary | ICD-10-CM | POA: Diagnosis not present

## 2022-12-21 DIAGNOSIS — G894 Chronic pain syndrome: Secondary | ICD-10-CM | POA: Diagnosis not present

## 2022-12-21 DIAGNOSIS — I110 Hypertensive heart disease with heart failure: Secondary | ICD-10-CM | POA: Diagnosis not present

## 2022-12-21 DIAGNOSIS — E871 Hypo-osmolality and hyponatremia: Secondary | ICD-10-CM | POA: Diagnosis not present

## 2022-12-21 DIAGNOSIS — I5032 Chronic diastolic (congestive) heart failure: Secondary | ICD-10-CM | POA: Diagnosis not present

## 2022-12-21 DIAGNOSIS — S8263XD Displaced fracture of lateral malleolus of unspecified fibula, subsequent encounter for closed fracture with routine healing: Secondary | ICD-10-CM | POA: Diagnosis not present

## 2022-12-21 DIAGNOSIS — E8721 Acute metabolic acidosis: Secondary | ICD-10-CM | POA: Diagnosis not present

## 2022-12-21 DIAGNOSIS — I4711 Inappropriate sinus tachycardia, so stated: Secondary | ICD-10-CM | POA: Diagnosis not present

## 2022-12-23 ENCOUNTER — Ambulatory Visit
Admission: RE | Admit: 2022-12-23 | Discharge: 2022-12-23 | Disposition: A | Discharge status: Home / Self Care | Payer: Medicare HMO | Source: Ambulatory Visit | Attending: Oncology | Admitting: Oncology

## 2022-12-23 DIAGNOSIS — J219 Acute bronchiolitis, unspecified: Secondary | ICD-10-CM | POA: Diagnosis not present

## 2022-12-23 DIAGNOSIS — I700 Atherosclerosis of aorta: Secondary | ICD-10-CM | POA: Diagnosis not present

## 2022-12-23 DIAGNOSIS — C8290 Follicular lymphoma, unspecified, unspecified site: Secondary | ICD-10-CM | POA: Diagnosis not present

## 2022-12-23 DIAGNOSIS — C8202 Follicular lymphoma grade I, intrathoracic lymph nodes: Secondary | ICD-10-CM | POA: Diagnosis not present

## 2022-12-23 DIAGNOSIS — R918 Other nonspecific abnormal finding of lung field: Secondary | ICD-10-CM | POA: Diagnosis not present

## 2022-12-23 DIAGNOSIS — I2510 Atherosclerotic heart disease of native coronary artery without angina pectoris: Secondary | ICD-10-CM | POA: Diagnosis not present

## 2022-12-23 LAB — GLUCOSE, CAPILLARY: Glucose-Capillary: 103 mg/dL — ABNORMAL HIGH (ref 70–99)

## 2022-12-23 MED ORDER — FLUDEOXYGLUCOSE F - 18 (FDG) INJECTION
12.3000 | Freq: Once | INTRAVENOUS | Status: AC | PRN
  Administered 2022-12-23: 13.37 via INTRAVENOUS

## 2022-12-24 DIAGNOSIS — I110 Hypertensive heart disease with heart failure: Secondary | ICD-10-CM | POA: Diagnosis not present

## 2022-12-24 DIAGNOSIS — S8263XD Displaced fracture of lateral malleolus of unspecified fibula, subsequent encounter for closed fracture with routine healing: Secondary | ICD-10-CM | POA: Diagnosis not present

## 2022-12-24 DIAGNOSIS — I5032 Chronic diastolic (congestive) heart failure: Secondary | ICD-10-CM | POA: Diagnosis not present

## 2022-12-24 DIAGNOSIS — I4711 Inappropriate sinus tachycardia, so stated: Secondary | ICD-10-CM | POA: Diagnosis not present

## 2022-12-24 DIAGNOSIS — E871 Hypo-osmolality and hyponatremia: Secondary | ICD-10-CM | POA: Diagnosis not present

## 2022-12-24 DIAGNOSIS — S82851D Displaced trimalleolar fracture of right lower leg, subsequent encounter for closed fracture with routine healing: Secondary | ICD-10-CM | POA: Diagnosis not present

## 2022-12-24 DIAGNOSIS — E8721 Acute metabolic acidosis: Secondary | ICD-10-CM | POA: Diagnosis not present

## 2022-12-24 DIAGNOSIS — G4730 Sleep apnea, unspecified: Secondary | ICD-10-CM | POA: Diagnosis not present

## 2022-12-24 DIAGNOSIS — G894 Chronic pain syndrome: Secondary | ICD-10-CM | POA: Diagnosis not present

## 2022-12-28 ENCOUNTER — Other Ambulatory Visit: Payer: Medicare HMO

## 2022-12-28 DIAGNOSIS — I5032 Chronic diastolic (congestive) heart failure: Secondary | ICD-10-CM | POA: Diagnosis not present

## 2022-12-28 DIAGNOSIS — S82851D Displaced trimalleolar fracture of right lower leg, subsequent encounter for closed fracture with routine healing: Secondary | ICD-10-CM | POA: Diagnosis not present

## 2022-12-28 DIAGNOSIS — I4711 Inappropriate sinus tachycardia, so stated: Secondary | ICD-10-CM | POA: Diagnosis not present

## 2022-12-28 DIAGNOSIS — S8263XD Displaced fracture of lateral malleolus of unspecified fibula, subsequent encounter for closed fracture with routine healing: Secondary | ICD-10-CM | POA: Diagnosis not present

## 2022-12-28 DIAGNOSIS — G894 Chronic pain syndrome: Secondary | ICD-10-CM | POA: Diagnosis not present

## 2022-12-28 DIAGNOSIS — G4730 Sleep apnea, unspecified: Secondary | ICD-10-CM | POA: Diagnosis not present

## 2022-12-28 DIAGNOSIS — E8721 Acute metabolic acidosis: Secondary | ICD-10-CM | POA: Diagnosis not present

## 2022-12-28 DIAGNOSIS — E871 Hypo-osmolality and hyponatremia: Secondary | ICD-10-CM | POA: Diagnosis not present

## 2022-12-28 DIAGNOSIS — I110 Hypertensive heart disease with heart failure: Secondary | ICD-10-CM | POA: Diagnosis not present

## 2022-12-29 ENCOUNTER — Other Ambulatory Visit: Payer: Self-pay | Admitting: Urology

## 2022-12-29 DIAGNOSIS — G894 Chronic pain syndrome: Secondary | ICD-10-CM | POA: Diagnosis not present

## 2022-12-29 DIAGNOSIS — E871 Hypo-osmolality and hyponatremia: Secondary | ICD-10-CM | POA: Diagnosis not present

## 2022-12-29 DIAGNOSIS — E8721 Acute metabolic acidosis: Secondary | ICD-10-CM | POA: Diagnosis not present

## 2022-12-29 DIAGNOSIS — I110 Hypertensive heart disease with heart failure: Secondary | ICD-10-CM | POA: Diagnosis not present

## 2022-12-29 DIAGNOSIS — S82851D Displaced trimalleolar fracture of right lower leg, subsequent encounter for closed fracture with routine healing: Secondary | ICD-10-CM | POA: Diagnosis not present

## 2022-12-29 DIAGNOSIS — I5032 Chronic diastolic (congestive) heart failure: Secondary | ICD-10-CM | POA: Diagnosis not present

## 2022-12-29 DIAGNOSIS — G4730 Sleep apnea, unspecified: Secondary | ICD-10-CM | POA: Diagnosis not present

## 2022-12-29 DIAGNOSIS — S8263XD Displaced fracture of lateral malleolus of unspecified fibula, subsequent encounter for closed fracture with routine healing: Secondary | ICD-10-CM | POA: Diagnosis not present

## 2022-12-29 DIAGNOSIS — I4711 Inappropriate sinus tachycardia, so stated: Secondary | ICD-10-CM | POA: Diagnosis not present

## 2022-12-30 ENCOUNTER — Other Ambulatory Visit: Payer: Self-pay | Admitting: Gastroenterology

## 2022-12-30 ENCOUNTER — Ambulatory Visit (INDEPENDENT_AMBULATORY_CARE_PROVIDER_SITE_OTHER): Payer: Medicare HMO | Admitting: Nurse Practitioner

## 2022-12-30 ENCOUNTER — Other Ambulatory Visit: Payer: Self-pay | Admitting: Urology

## 2022-12-30 ENCOUNTER — Encounter: Payer: Self-pay | Admitting: Nurse Practitioner

## 2022-12-30 VITALS — BP 113/73 | HR 106 | Temp 98.1°F

## 2022-12-30 DIAGNOSIS — R55 Syncope and collapse: Secondary | ICD-10-CM

## 2022-12-30 DIAGNOSIS — R7309 Other abnormal glucose: Secondary | ICD-10-CM

## 2022-12-30 MED ORDER — AMLODIPINE BESYLATE 2.5 MG PO TABS: 2.5000 mg | ORAL_TABLET | Freq: Every day | ORAL | 0 refills | Status: DC

## 2022-12-30 NOTE — Progress Notes (Signed)
BP 113/73   Pulse (!) 106   Temp 98.1 F (36.7 C) (Oral)   SpO2 98%    Subjective:    Patient ID: Russell Simpson, male    DOB: December 18, 1960, 62 y.o.   MRN: [id number]  HPI: Russell Simpson is a 62 y.o. male  Chief Complaint  Patient presents with   Loss of Consciousness    Pt states he has been dealing with fainting spells for a few years now. States he thinks he is over medicated that contributes to his and/or his BP is dropping.    Pt states he has been dealing with fainting spells for a few years now. States he thinks he is over medicated that contributes to his and/or his BP is dropping.   Patient states he will start twitching and then sometimes his symptoms go away and then other times he ends up on the floor. He has not been checking blood pressures at home.  Feels like he drinks about 3 bottles of water daily.  Does not drink any caffeine.  He does see a psychiatrist.    He broke his leg on new years day from passing out and hasn't had any more episodes since then.  Gets his cast off his leg in about 2 weeks.    Relevant past medical, surgical, family and social history reviewed and updated as indicated. Interim medical history since our last visit reviewed. Allergies and medications reviewed and updated.  Review of Systems  Neurological:  Positive for numbness.       Loss of consciousness    Per HPI unless specifically indicated above     Objective:    BP 113/73   Pulse (!) 106   Temp 98.1 F (36.7 C) (Oral)   SpO2 98%   Wt Readings from Last 3 Encounters:  11/30/22 238 lb (108 kg)  11/27/22 241 lb 8 oz (109.5 kg)  11/09/22 238 lb (108 kg)    Physical Exam Vitals and nursing note reviewed.  Constitutional:      General: He is not in acute distress.    Appearance: Normal appearance. He is not ill-appearing, toxic-appearing or diaphoretic.  HENT:     Head: Normocephalic.     Right Ear: External ear normal.     Left Ear: External ear normal.     Nose: Nose  normal. No congestion or rhinorrhea.     Mouth/Throat:     Mouth: Mucous membranes are moist.  Eyes:     General:        Right eye: No discharge.        Left eye: No discharge.     Extraocular Movements: Extraocular movements intact.     Conjunctiva/sclera: Conjunctivae normal.     Pupils: Pupils are equal, round, and reactive to light.  Cardiovascular:     Rate and Rhythm: Normal rate and regular rhythm.     Heart sounds: No murmur heard. Pulmonary:     Effort: Pulmonary effort is normal. No respiratory distress.     Breath sounds: Normal breath sounds. No wheezing, rhonchi or rales.  Abdominal:     General: Abdomen is flat. Bowel sounds are normal.  Musculoskeletal:     Cervical back: Normal range of motion and neck supple.  Skin:    General: Skin is warm and dry.     Capillary Refill: Capillary refill takes less than 2 seconds.  Neurological:     General: No focal deficit present.     Mental Status:  He is alert and oriented to person, place, and time.     Cranial Nerves: Cranial nerves 2-12 are intact.     Sensory: Sensation is intact.  Psychiatric:        Mood and Affect: Mood normal.        Behavior: Behavior normal.        Thought Content: Thought content normal.        Judgment: Judgment normal.     Results for orders placed or performed during the hospital encounter of 12/23/22  Glucose, capillary  Result Value Ref Range   Glucose-Capillary 103 (H) 70 - 99 mg/dL      Assessment & Plan:   Problem List Items Addressed This Visit       Other   Brief loss of consciousness - Primary    Ongoing concern. Suspect it is multifactorial.  Will decrease amlodipine '5mg'$  to 2.'5mg'$ .  CMP and CBC ordered today.  Recommend speaking with psychiatry about decreasing medications he is on.  If symptoms have not improved will send to neurology for further evaluation.  Follow up in 1 month.      Relevant Orders   Comp Met (CMET)   CBC w/Diff     Follow up plan: Return in  about 1 year (around 12/31/2023) for Follow up on passing out.

## 2022-12-30 NOTE — Assessment & Plan Note (Signed)
Ongoing concern. Suspect it is multifactorial.  Will decrease amlodipine '5mg'$  to 2.'5mg'$ .  CMP and CBC ordered today.  Recommend speaking with psychiatry about decreasing medications he is on.  If symptoms have not improved will send to neurology for further evaluation.  Follow up in 1 month.

## 2022-12-31 ENCOUNTER — Ambulatory Visit (INDEPENDENT_AMBULATORY_CARE_PROVIDER_SITE_OTHER): Payer: Medicare HMO | Admitting: Dermatology

## 2022-12-31 ENCOUNTER — Other Ambulatory Visit: Payer: Self-pay | Admitting: *Deleted

## 2022-12-31 VITALS — BP 116/79 | HR 113

## 2022-12-31 DIAGNOSIS — S8263XD Displaced fracture of lateral malleolus of unspecified fibula, subsequent encounter for closed fracture with routine healing: Secondary | ICD-10-CM | POA: Diagnosis not present

## 2022-12-31 DIAGNOSIS — E871 Hypo-osmolality and hyponatremia: Secondary | ICD-10-CM | POA: Diagnosis not present

## 2022-12-31 DIAGNOSIS — L814 Other melanin hyperpigmentation: Secondary | ICD-10-CM

## 2022-12-31 DIAGNOSIS — D229 Melanocytic nevi, unspecified: Secondary | ICD-10-CM

## 2022-12-31 DIAGNOSIS — C44319 Basal cell carcinoma of skin of other parts of face: Secondary | ICD-10-CM

## 2022-12-31 DIAGNOSIS — Z85828 Personal history of other malignant neoplasm of skin: Secondary | ICD-10-CM | POA: Diagnosis not present

## 2022-12-31 DIAGNOSIS — I4711 Inappropriate sinus tachycardia, so stated: Secondary | ICD-10-CM | POA: Diagnosis not present

## 2022-12-31 DIAGNOSIS — E8721 Acute metabolic acidosis: Secondary | ICD-10-CM | POA: Diagnosis not present

## 2022-12-31 DIAGNOSIS — L570 Actinic keratosis: Secondary | ICD-10-CM

## 2022-12-31 DIAGNOSIS — Z1283 Encounter for screening for malignant neoplasm of skin: Secondary | ICD-10-CM | POA: Diagnosis not present

## 2022-12-31 DIAGNOSIS — L578 Other skin changes due to chronic exposure to nonionizing radiation: Secondary | ICD-10-CM | POA: Diagnosis not present

## 2022-12-31 DIAGNOSIS — D485 Neoplasm of uncertain behavior of skin: Secondary | ICD-10-CM

## 2022-12-31 DIAGNOSIS — G894 Chronic pain syndrome: Secondary | ICD-10-CM | POA: Diagnosis not present

## 2022-12-31 DIAGNOSIS — L821 Other seborrheic keratosis: Secondary | ICD-10-CM | POA: Diagnosis not present

## 2022-12-31 DIAGNOSIS — C8202 Follicular lymphoma grade I, intrathoracic lymph nodes: Secondary | ICD-10-CM

## 2022-12-31 DIAGNOSIS — I110 Hypertensive heart disease with heart failure: Secondary | ICD-10-CM | POA: Diagnosis not present

## 2022-12-31 DIAGNOSIS — S82851D Displaced trimalleolar fracture of right lower leg, subsequent encounter for closed fracture with routine healing: Secondary | ICD-10-CM | POA: Diagnosis not present

## 2022-12-31 DIAGNOSIS — I5032 Chronic diastolic (congestive) heart failure: Secondary | ICD-10-CM | POA: Diagnosis not present

## 2022-12-31 DIAGNOSIS — G4730 Sleep apnea, unspecified: Secondary | ICD-10-CM | POA: Diagnosis not present

## 2022-12-31 LAB — COMPREHENSIVE METABOLIC PANEL
ALT: 28 IU/L (ref 0–44)
AST: 20 IU/L (ref 0–40)
Albumin/Globulin Ratio: 2.8 — ABNORMAL HIGH (ref 1.2–2.2)
Albumin: 4.2 g/dL (ref 3.8–4.9)
Alkaline Phosphatase: 204 IU/L — ABNORMAL HIGH (ref 44–121)
BUN/Creatinine Ratio: 8 — ABNORMAL LOW (ref 10–24)
BUN: 6 mg/dL — ABNORMAL LOW (ref 8–27)
Bilirubin Total: 0.3 mg/dL (ref 0.0–1.2)
CO2: 21 mmol/L (ref 20–29)
Calcium: 9 mg/dL (ref 8.6–10.2)
Chloride: 101 mmol/L (ref 96–106)
Creatinine, Ser: 0.78 mg/dL (ref 0.76–1.27)
Globulin, Total: 1.5 g/dL (ref 1.5–4.5)
Glucose: 110 mg/dL — ABNORMAL HIGH (ref 70–99)
Potassium: 4.5 mmol/L (ref 3.5–5.2)
Sodium: 139 mmol/L (ref 134–144)
Total Protein: 5.7 g/dL — ABNORMAL LOW (ref 6.0–8.5)
eGFR: 102 mL/min/{1.73_m2} (ref >59)

## 2022-12-31 LAB — CBC WITH DIFFERENTIAL/PLATELET
Basophils Absolute: 0 10*3/uL (ref 0.0–0.2)
Basos: 0 %
EOS (ABSOLUTE): 0.1 10*3/uL (ref 0.0–0.4)
Eos: 2 %
Hematocrit: 40.7 % (ref 37.5–51.0)
Hemoglobin: 14.5 g/dL (ref 13.0–17.7)
Immature Grans (Abs): 0 10*3/uL (ref 0.0–0.1)
Immature Granulocytes: 0 %
Lymphocytes Absolute: 2.1 10*3/uL (ref 0.7–3.1)
Lymphs: 42 %
MCH: 32.6 pg (ref 26.6–33.0)
MCHC: 35.6 g/dL (ref 31.5–35.7)
MCV: 92 fL (ref 79–97)
Monocytes Absolute: 0.5 10*3/uL (ref 0.1–0.9)
Monocytes: 9 %
Neutrophils Absolute: 2.2 10*3/uL (ref 1.4–7.0)
Neutrophils: 47 %
Platelets: 166 10*3/uL (ref 150–450)
RBC: 4.45 x10E6/uL (ref 4.14–5.80)
RDW: 12.4 % (ref 11.6–15.4)
WBC: 4.9 10*3/uL (ref 3.4–10.8)

## 2022-12-31 NOTE — Telephone Encounter (Signed)
Maritza tell him he can stop taking Tenofovir since he has stopped Rituxan for over a year. Suggest to check LFT's

## 2022-12-31 NOTE — Patient Instructions (Addendum)
Recommend Niacinamide or Nicotinamide '500mg'$  twice per day to lower risk of non-melanoma skin cancer by approximately 25%. This is usually available at [facility].  Wound Care Instructions  Cleanse wound gently with soap and water once a day then pat dry with clean gauze. Apply a thin coat of Petrolatum (petroleum jelly, "Vaseline") over the wound (unless you have an allergy to this). We recommend that you use a new, sterile tube of Vaseline. Do not pick or remove scabs. Do not remove the yellow or white "healing tissue" from the base of the wound.  Cover the wound with fresh, clean, nonstick gauze and secure with paper tape. You may use Band-Aids in place of gauze and tape if the wound is small enough, but would recommend trimming much of the tape off as there is often too much. Sometimes Band-Aids can irritate the skin.  You should call the office for your biopsy report after 1 week if you have not already been contacted.  If you experience any problems, such as abnormal amounts of bleeding, swelling, significant bruising, significant pain, or evidence of infection, please call the office immediately.  FOR ADULT SURGERY PATIENTS: If you need something for pain relief you may take 1 extra strength Tylenol (acetaminophen) AND 2 Ibuprofen ('200mg'$  each) together every 4 hours as needed for pain. (do not take these if you are allergic to them or if you have a reason you should not take them.) Typically, you may only need pain medication for 1 to 3 days.    Start Calcipotriene 0.005% cream followed by Fluorouracil 5% cream twice daily to the chest x 7 days to treat precancerous skin lesions and sun damage.  Due to recent changes in healthcare laws, you may see results of your pathology and/or laboratory studies on MyChart before the doctors have had a chance to review them. We understand that in some cases there may be results that are confusing or concerning to you. Please understand that not all  results are received at the same time and often the doctors may need to interpret multiple results in order to provide you with the best plan of care or course of treatment. Therefore, we ask that you please give [place] 2 business days to thoroughly review all your results before contacting the office for clarification. Should we see a critical lab result, you will be contacted sooner.   If You Need Anything After Your Visit  If you have any questions or concerns for your doctor, please call our main line at [phone] and press option 4 to reach your doctor's medical assistant. If no one answers, please leave a voicemail as directed and we will return your call as soon as possible. Messages left after 4 pm will be answered the following business day.   You may also send [place] a message via [place]. We typically respond to MyChart messages within 1-2 business days.  For prescription refills, please ask your pharmacy to contact our office. Our fax number is [phone].  If you have an urgent issue when the clinic is closed that cannot wait until the next business day, you can page your doctor at the number below.    Please note that while we do our best to be available for urgent issues outside of office hours, we are not available 24/7.   If you have an urgent issue and are unable to reach [place], you may choose to seek medical care at your doctor's office, retail clinic, urgent care center,  or emergency room.  If you have a medical emergency, please immediately call 911 or go to the emergency department.  Pager Numbers  - Dr. Nehemiah Massed: [phone]  - Dr. Laurence Ferrari: [phone]  - Dr. Nicole Kindred: [phone]  In the event of inclement weather, please call our main line at [phone] for an update on the status of any delays or closures.  Dermatology Medication Tips: Please keep the boxes that topical medications come in in order to help keep track of the instructions about where and how to use  these. Pharmacies typically print the medication instructions only on the boxes and not directly on the medication tubes.   If your medication is too expensive, please contact our office at [phone] option 4 or send [place] a message through [place].   We are unable to tell what your co-pay for medications will be in advance as this is different depending on your insurance coverage. However, we may be able to find a substitute medication at lower cost or fill out paperwork to get insurance to cover a needed medication.   If a prior authorization is required to get your medication covered by your insurance company, please allow [place] 1-2 business days to complete this process.  Drug prices often vary depending on where the prescription is filled and some pharmacies may offer cheaper prices.  The website www.goodrx.com contains coupons for medications through different pharmacies. The prices here do not account for what the cost may be with help from insurance (it may be cheaper with your insurance), but the website can give you the price if you did not use any insurance.  - You can print the associated coupon and take it with your prescription to the pharmacy.  - You may also stop by our office during regular business hours and pick up a GoodRx coupon card.  - If you need your prescription sent electronically to a different pharmacy, notify our office through [facility] or by phone at [phone] option 4.     Si Usted Necesita Algo Despus de Su Visita  Tambin puede enviarnos un mensaje a travs de Pharmacist, community. Por lo general respondemos a los mensajes de MyChart en el transcurso de 1 a 2 das hbiles.  Para renovar recetas, por favor pida a su farmacia que se ponga en contacto con nuestra oficina. Harland Dingwall de fax es [place] [phone].  Si tiene un asunto urgente cuando la clnica est cerrada y que no puede esperar hasta el siguiente da hbil, puede llamar/localizar a su doctor(a) al  nmero que aparece a continuacin.   Por favor, tenga en cuenta que aunque hacemos todo lo posible para estar disponibles para asuntos urgentes fuera del horario de [place], no estamos disponibles las 24 horas del da, los 7 das de la [place].   Si tiene un problema urgente y no puede comunicarse con nosotros, puede optar por buscar atencin mdica  en el consultorio de su doctor(a), en una clnica privada, en un centro de atencin urgente o en una sala de emergencias.  Si tiene Engineering geologist, por favor llame inmediatamente al 911 o vaya a la sala de emergencias.  Nmeros de bper  - Dr. Nehemiah Massed: [phone]  - Dra. Moye: [phone]  - Dra. Nicole Kindred: [phone]  En caso de inclemencias del [place], por favor llame a Johnsie Kindred principal al [phone] para una actualizacin sobre el [place] de cualquier retraso o cierre.  Consejos para la medicacin en dermatologa: Por favor, guarde las [employer]  vienen los medicamentos de uso tpico para ayudarle a seguir las instrucciones sobre dnde y cmo usarlos. Las farmacias generalmente imprimen las instrucciones del medicamento slo en las cajas y no directamente en los tubos del [place].   Si su medicamento es muy caro, por favor, pngase en contacto con Zigmund Daniel llamando al [phone] y presione la opcin 4 o envenos un mensaje a travs de Pharmacist, community.   No podemos decirle cul ser su copago por los medicamentos por adelantado ya que esto es diferente dependiendo de la cobertura de su seguro. Sin embargo, es posible que podamos encontrar un medicamento sustituto a Electrical engineer un formulario para que el seguro cubra el medicamento que se considera necesario.   Si se requiere una autorizacin previa para que su compaa de seguros [place] su medicamento, por favor permtanos de 1 a 2 das hbiles para completar este proceso.  Los precios de los medicamentos varan con frecuencia dependiendo del Environmental consultant de  dnde se surte la receta y alguna farmacias pueden ofrecer precios ms baratos.  El sitio web www.goodrx.com tiene cupones para medicamentos de Airline pilot. Los precios aqu no tienen en cuenta lo que podra costar con la ayuda del seguro (puede ser ms barato con su seguro), pero el sitio web puede darle el precio si no utiliz Research scientist (physical sciences).  - Puede imprimir el cupn correspondiente y llevarlo con su receta a la farmacia.  - Tambin puede pasar por nuestra oficina durante el horario de atencin regular y Charity fundraiser una tarjeta de cupones de GoodRx.  - Si necesita que su receta se enve electrnicamente a una farmacia diferente, informe a nuestra oficina a travs de MyChart de [place] o por telfono llamando al [phone] y presione la opcin 4.

## 2022-12-31 NOTE — Progress Notes (Signed)
Please let patient know that overall his lab work looks good.  His liver looks good.  He does look a little dehydrated. I recommend increasing water intake.  His sugar is also elevated so I have added on a lab to determine whether or not he is diabetic. I will let him know the results once they return.

## 2022-12-31 NOTE — Addendum Note (Signed)
Addended by: Jon Billings on: 12/31/2022 07:49 AM   Modules accepted: Orders

## 2022-12-31 NOTE — Progress Notes (Signed)
Follow-Up Visit   Subjective  Russell Simpson is a 62 y.o. male who presents for the following: Annual Exam (Hx BCC, SCC, Aks). The patient presents for Total-Body Skin Exam (TBSE) for skin cancer screening and mole check.  The patient has spots, moles and lesions to be evaluated, some may be new or changing.  The following portions of the chart were reviewed this encounter and updated as appropriate:   Tobacco  Allergies  Meds  Problems  Med Hx  Surg Hx  Fam Hx      Review of Systems:  No other skin or systemic complaints except as noted in HPI or Assessment and Plan.  Objective  Well appearing patient in no apparent distress; mood and affect are within normal limits.  A full examination was performed including scalp, head, eyes, ears, nose, lips, neck, chest, axillae, abdomen, back, buttocks, bilateral upper extremities, bilateral lower extremities, hands, feet, fingers, toes, fingernails, and toenails. All findings within normal limits unless otherwise noted below.  L pretibial x 3, R sup shoulder x 1 (4) Erythematous thin papules/macules with gritty scale.   R temporal hair line 0.3 cm pink papule.        Assessment & Plan  AK (actinic keratosis) (4) L pretibial x 3, R sup shoulder x 1  Hypertrophic   Destruction of lesion - L pretibial x 3, R sup shoulder x 1 Complexity: simple   Destruction method: cryotherapy   Informed consent: discussed and consent obtained   Timeout:  patient name, date of birth, surgical site, and procedure verified Lesion destroyed using liquid nitrogen: Yes   Region frozen until ice ball extended beyond lesion: Yes   Outcome: patient tolerated procedure well with no complications   Post-procedure details: wound care instructions given    Neoplasm of uncertain behavior of skin R temporal hair line  Skin / nail biopsy Type of biopsy: tangential   Informed consent: discussed and consent obtained   Timeout: patient name, date of  birth, surgical site, and procedure verified   Procedure prep:  Patient was prepped and draped in usual sterile fashion Prep type:  Isopropyl alcohol Anesthesia: the lesion was anesthetized in a standard fashion   Anesthetic:  1% lidocaine w/ epinephrine 1-100,000 buffered w/ 8.4% NaHCO3 Instrument used: flexible razor blade   Hemostasis achieved with: pressure, aluminum chloride and electrodesiccation   Outcome: patient tolerated procedure well   Post-procedure details: sterile dressing applied and wound care instructions given   Dressing type: bandage and petrolatum    Specimen 1 - Surgical pathology Differential Diagnosis: D48.5 r/o BCC Check Margins: No  If positive for [facility] recommend Mohs surgery. Discussed with patient and he agrees to Mohs.   Lentigines - Scattered tan macules - Due to sun exposure - Benign-appearing, observe - Recommend daily broad spectrum sunscreen SPF 30+ to sun-exposed areas, reapply every 2 hours as needed. - Call for any changes  Seborrheic Keratoses - Stuck-on, waxy, tan-brown papules and/or plaques  - Benign-appearing - Discussed benign etiology and prognosis. - Observe - Call for any changes  Melanocytic Nevi - Tan-brown and/or pink-flesh-colored symmetric macules and papules - Benign appearing on exam today - Observation - Call clinic for new or changing moles - Recommend daily use of broad spectrum spf 30+ sunscreen to sun-exposed areas.   Hemangiomas - Red papules - Discussed benign nature - Observe - Call for any changes  Actinic Damage with PreCancerous Actinic Keratoses Counseling for Topical Chemotherapy Management: Patient exhibits: - Severe, confluent actinic  changes with pre-cancerous actinic keratoses that is secondary to cumulative UV radiation exposure over time - Condition that is severe; chronic, not at goal. - diffuse scaly erythematous macules and papules with underlying dyspigmentation - Discussed Prescription  "Field Treatment" topical Chemotherapy for Severe, Chronic Confluent Actinic Changes with Pre-Cancerous Actinic Keratoses Field treatment involves treatment of an entire area of skin that has confluent Actinic Changes (Sun/ Ultraviolet light damage) and PreCancerous Actinic Keratoses by method of PhotoDynamic Therapy (PDT) and/or prescription Topical Chemotherapy agents such as 5-fluorouracil, 5-fluorouracil/calcipotriene, and/or imiquimod.  The purpose is to decrease the number of clinically evident and subclinical PreCancerous lesions to prevent progression to development of skin cancer by chemically destroying early precancer changes that may or may not be visible.  It has been shown to reduce the risk of developing skin cancer in the treated area. As a result of treatment, redness, scaling, crusting, and open sores may occur during treatment course. One or more than one of these methods may be used and may have to be used several times to control, suppress and eliminate the PreCancerous changes. Discussed treatment course, expected reaction, and possible side effects. - Recommend daily broad spectrum sunscreen SPF 30+ to sun-exposed areas, reapply every 2 hours as needed.  - Staying in the shade or wearing long sleeves, sun glasses (UVA+UVB protection) and wide brim hats (4-inch brim around the entire circumference of the hat) are also recommended. - Call for new or changing lesions. - Will hold off on biopsy results and treatment until after lesion has been treated if needed. Then plan retreating with 5FU/Calcipotriene mix at a later date to the face. Start 5FU/Calcipotriene BID x 7 days to the chest.    History of Basal Cell Carcinoma of the Skin - No evidence of recurrence today - Recommend regular full body skin exams - Recommend daily broad spectrum sunscreen SPF 30+ to sun-exposed areas, reapply every 2 hours as needed.  - Call if any new or changing lesions are noted between office  visits  History of Squamous Cell Carcinoma of the Skin - No evidence of recurrence today - No lymphadenopathy - Recommend regular full body skin exams - Recommend daily broad spectrum sunscreen SPF 30+ to sun-exposed areas, reapply every 2 hours as needed.  - Call if any new or changing lesions are noted between office visits - Recommend Niacinamide or Nicotinamide '500mg'$  twice per day to lower risk of non-melanoma skin cancer by approximately 25%. This is usually available at [facility].  Skin cancer screening performed today.  Return in about 6 months (around 07/01/2023) for UBSE.  Luther Redo, CMA, am acting as scribe for Forest Gleason, MD .  Documentation: I have reviewed the above documentation for accuracy and completeness, and I agree with the above.  Forest Gleason, MD

## 2022-12-31 NOTE — Telephone Encounter (Signed)
Archana Is he still on Rituxan?

## 2023-01-01 ENCOUNTER — Other Ambulatory Visit: Payer: Self-pay

## 2023-01-01 ENCOUNTER — Inpatient Hospital Stay (HOSPITAL_BASED_OUTPATIENT_CLINIC_OR_DEPARTMENT_OTHER): Payer: Medicare HMO | Admitting: Oncology

## 2023-01-01 ENCOUNTER — Encounter: Payer: Self-pay | Admitting: Oncology

## 2023-01-01 ENCOUNTER — Inpatient Hospital Stay: Payer: Medicare HMO | Attending: Oncology | Admitting: Licensed Clinical Social Worker

## 2023-01-01 ENCOUNTER — Inpatient Hospital Stay: Payer: Medicare HMO | Attending: Oncology

## 2023-01-01 ENCOUNTER — Telehealth: Payer: Self-pay | Admitting: Urology

## 2023-01-01 VITALS — BP 121/87 | HR 97 | Temp 97.9°F | Resp 16

## 2023-01-01 DIAGNOSIS — E291 Testicular hypofunction: Secondary | ICD-10-CM | POA: Insufficient documentation

## 2023-01-01 DIAGNOSIS — Z8572 Personal history of non-Hodgkin lymphomas: Secondary | ICD-10-CM | POA: Diagnosis not present

## 2023-01-01 DIAGNOSIS — C8202 Follicular lymphoma grade I, intrathoracic lymph nodes: Secondary | ICD-10-CM | POA: Diagnosis not present

## 2023-01-01 DIAGNOSIS — I10 Essential (primary) hypertension: Secondary | ICD-10-CM | POA: Diagnosis not present

## 2023-01-01 DIAGNOSIS — E785 Hyperlipidemia, unspecified: Secondary | ICD-10-CM | POA: Insufficient documentation

## 2023-01-01 DIAGNOSIS — Z79624 Long term (current) use of inhibitors of nucleotide synthesis: Secondary | ICD-10-CM | POA: Diagnosis not present

## 2023-01-01 DIAGNOSIS — Z7982 Long term (current) use of aspirin: Secondary | ICD-10-CM | POA: Diagnosis not present

## 2023-01-01 DIAGNOSIS — Z79899 Other long term (current) drug therapy: Secondary | ICD-10-CM | POA: Insufficient documentation

## 2023-01-01 DIAGNOSIS — Z87891 Personal history of nicotine dependence: Secondary | ICD-10-CM | POA: Diagnosis not present

## 2023-01-01 LAB — CBC WITH DIFFERENTIAL/PLATELET
Abs Immature Granulocytes: 0.03 10*3/uL (ref 0.00–0.07)
Basophils Absolute: 0 10*3/uL (ref 0.0–0.1)
Basophils Relative: 0 %
Eosinophils Absolute: 0.1 10*3/uL (ref 0.0–0.5)
Eosinophils Relative: 1 %
HCT: 40.5 % (ref 39.0–52.0)
Hemoglobin: 14.1 g/dL (ref 13.0–17.0)
Immature Granulocytes: 1 %
Lymphocytes Relative: 38 %
Lymphs Abs: 2.3 10*3/uL (ref 0.7–4.0)
MCH: 31.6 pg (ref 26.0–34.0)
MCHC: 34.8 g/dL (ref 30.0–36.0)
MCV: 90.8 fL (ref 80.0–100.0)
Monocytes Absolute: 0.7 10*3/uL (ref 0.1–1.0)
Monocytes Relative: 11 %
Neutro Abs: 3 10*3/uL (ref 1.7–7.7)
Neutrophils Relative %: 49 %
Platelets: 144 10*3/uL — ABNORMAL LOW (ref 150–400)
RBC: 4.46 MIL/uL (ref 4.22–5.81)
RDW: 12.9 % (ref 11.5–15.5)
WBC: 6.1 10*3/uL (ref 4.0–10.5)
nRBC: 0 % (ref 0.0–0.2)

## 2023-01-01 LAB — COMPREHENSIVE METABOLIC PANEL
ALT: 28 U/L (ref 0–44)
AST: 30 U/L (ref 15–41)
Albumin: 3.8 g/dL (ref 3.5–5.0)
Alkaline Phosphatase: 164 U/L — ABNORMAL HIGH (ref 38–126)
Anion gap: 12 (ref 5–15)
BUN: 7 mg/dL (ref 6–20)
CO2: 22 mmol/L (ref 22–32)
Calcium: 8.8 mg/dL — ABNORMAL LOW (ref 8.9–10.3)
Chloride: 102 mmol/L (ref 98–111)
Creatinine, Ser: 0.86 mg/dL (ref 0.61–1.24)
GFR, Estimated: >60 mL/min (ref >60)
Glucose, Bld: 126 mg/dL — ABNORMAL HIGH (ref 70–99)
Potassium: 4.6 mmol/L (ref 3.5–5.1)
Sodium: 136 mmol/L (ref 135–145)
Total Bilirubin: 0.5 mg/dL (ref 0.3–1.2)
Total Protein: 6.2 g/dL — ABNORMAL LOW (ref 6.5–8.1)

## 2023-01-01 LAB — SPECIMEN STATUS REPORT

## 2023-01-01 LAB — LACTATE DEHYDROGENASE: LDH: 127 U/L (ref 98–192)

## 2023-01-01 LAB — HEMOGLOBIN A1C
Est. average glucose Bld gHb Est-mCnc: 131 mg/dL
Hgb A1c MFr Bld: 6.2 % — ABNORMAL HIGH (ref 4.8–5.6)

## 2023-01-01 MED ORDER — ONETOUCH ULTRA VI STRP: 1.0000 | ORAL_STRIP | Freq: Every day | 3 refills | Status: DC

## 2023-01-01 MED ORDER — ONETOUCH ULTRA 2 W/DEVICE KIT: 1.0000 | PACK | Freq: Every day | 0 refills | Status: DC

## 2023-01-01 MED ORDER — ONETOUCH ULTRASOFT LANCETS MISC: 1.0000 | Freq: Every day | 3 refills | Status: DC

## 2023-01-01 NOTE — Telephone Encounter (Signed)
Not an emergency; defer management of the situation to either Sam or Dr. Bernardo Heater on Monday  Hollice Espy, MD

## 2023-01-01 NOTE — Telephone Encounter (Signed)
Dr. Bernardo Heater pt.  Pt called and is completely out of his Depotestosterone Cypionate 200 MG/ML injections.  Pharmacy is [name] in [place].  Pt states that can not come in for upcoming appt, he has a broken leg

## 2023-01-01 NOTE — Progress Notes (Unsigned)
Pt reports he blacked out last month and fell and broke his right leg, ankle and foot.  Pt reports MD thinks it is due to being a possible diabetic and he is currently check his blood sugar 3 times a day.  Pt reports he is also unable to work and is having difficulties paying some bills.  Referral to SW obtained.

## 2023-01-01 NOTE — Progress Notes (Signed)
[place] Work  Initial Assessment   Russell Simpson is a 62 y.o. year old male contacted by phone. Clinical Social Work was referred by medical provider for assessment of psychosocial needs.   SDOH (Social Determinants of Health) assessments performed: Yes SDOH Interventions    Flowsheet Row Clinical Support from 01/01/2023 in [place] at [facility] Visit from 12/30/2022 in [place] Telephone from 12/07/2022 in [place] Coordination Telephone from 11/06/2022 in [place] Coordination Office Visit from 08/11/2022 in [place] from 10/27/2021 in [place] Interventions        Food Insecurity Interventions -- -- Intervention Not Indicated Intervention Not Indicated -- Intervention Not Indicated  Housing Interventions -- -- Intervention Not Indicated Intervention Not Indicated -- Intervention Not Indicated  Transportation Interventions -- -- Intervention Not Indicated Intervention Not Indicated -- Intervention Not Indicated  Depression Interventions/Treatment  -- Medication, Currently on Treatment -- -- Currently on Treatment Currently on Treatment  Financial Strain Interventions Development worker, community, Other (Comment)  [Referral to Dance movement psychotherapist fund manager] -- -- -- -- [id number] Referral  Physical Activity Interventions Intervention Not Indicated -- -- -- -- --  Stress Interventions -- -- -- -- -- Intervention Not Indicated  Social Connections Interventions Intervention Not Indicated -- -- -- -- Intervention Not Indicated       SDOH Screenings   Food Insecurity: No Food Insecurity (12/07/2022)  Housing: Low Risk  (12/07/2022)  Transportation Needs: No Transportation Needs (12/07/2022)  Utilities: Not At Risk (12/01/2022)  Alcohol Screen: High Risk (01/01/2023)  Depression (PHQ2-9): Medium  Risk (12/30/2022)  Financial Resource Strain: High Risk (01/01/2023)  Physical Activity: Inactive (01/01/2023)  Social Connections: Socially Isolated (01/01/2023)  Stress: No Stress Concern Present (10/27/2021)  Tobacco Use: Medium Risk (01/01/2023)     Distress Screen completed: No     No data to display            Family/Social Information:  Housing Arrangement: patient lives alone, Odelia Gage (daughter) [phone] is the main contact. Family members/support persons in your life? Family, Friends, and Geophysical data processor concerns: no, immediate concerns, patient is currently unable to drive.  Patient stated he will contact [place] for transportation assistance to appointments, and if they are unable to assist he will contact [facility] to schedule transportation for appointments.  Employment: Disabled  .  Income source: Banker concerns: Yes, current concerns Type of concern: Utilities and Medical bills Food access concerns: no Religious or spiritual practice: Not known Services Currently in place:  [employer], Disability, Home Health PT/OT due to recent leg fracture  Coping/ Adjustment to diagnosis: Patient understands treatment plan and what happens next? yes Concerns about diagnosis and/or treatment: How I will pay for the services I need and How will I care for myself Patient reported stressors: Finances, Transportation, and Physical issues Hopes and/or priorities: to afford medical expenses Patient enjoys time with family/ friends Current coping skills/ strengths: Average or above average intelligence , Capable of independent living , Communication skills , Special hobby/interest , Supportive family/friends , and Work skills     SUMMARY: Current SDOH Barriers:  Financial constraints related to fixed income, Limited social support, ADL IADL limitations, Mental Health Concerns , and Substance abuse issues -  ETOH  - Patient is not  interested in ETOH reduction programs. Clinical Social Work Clinical Goal(s):  Patient  will follow up with [facility] for transportation needs* as directed by SW  Interventions: Discussed common feeling and emotions when being diagnosed with cancer, and the importance of support during treatment Informed patient of the support team roles and support services at [facility] Provided CSW contact information and encouraged patient to call with any questions or concerns Referred patient to financial navigator and Engineer, building services and Provided patient with information about CSW role inpatient care and other available resources.   Follow Up Plan: Patient will contact CSW with any support or resource needs Patient verbalizes understanding of plan: Yes    Hillary Schwegler, LCSW

## 2023-01-01 NOTE — Progress Notes (Signed)
Hi Russell Simpson. Your lab work shows that you are prediabetic.  This could also be contributing to your symptoms.  If he is willing, I would like to send in glucose testing supplies so he can keep track of his blood sugar.  The episodes could be caused by low blood sugar. If he agrees, I will send them in.

## 2023-01-02 NOTE — Progress Notes (Signed)
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 [name]  He sustained a left anterior frontal sinus as well as supraorbital rim fracture on 11/21/2019 which was managed conservatively.  He then presented to the ER at [facility] with symptoms of right-sided chest wall pain this led to a CT PE which did not show any evidence of pulmonary embolism.  He was noted to have bilateral symmetric axillary adenopathy measuring up to 1.8 cm.  Scattered mediastinal adenopathy.  Right paratracheal node measuring up to 1.2 cm.  Prominent right costophrenic lymph node measuring up to 1 cm.  Findings are nonspecific but could be seen with lymphoma versus systemic inflammatory disease such as sarcoidosis.  Of note patient has had a prior CT scan for lung screening back in 2015 when he was not noted to have this adenopathy.    Repeat CT chest in August 2021 showed persistent mild nonbulky axillary and mediastinal adenopathy which had not changed significantly as compared to his prior CT in December 2020.  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   Repeat CT in March 2022 showed Progression and multistation adenopathy as well as  significant splenomegaly measuring 20.4 cm as compared to 14.5 cm on CT scan September 2021.  CT scan showed multistation adenopathy with SUVs ranging between 6-8.9.  Patient underwent another core biopsy of right inguinal lymph node which was consistent with follicular lymphoma grade 1-2.  Bone marrow biopsy also showed moderate involvement with non-Hodgkin's B-cell lymphoma.  Baseline hepatitis B testing showed core antibody positivity.  Seen by GI and started on tenofovir   Patient had symptoms of nausea and sweating during cycle 1 of Rituxan which resolved with additional premedications.  He developed symptoms of itching over neck chest back and bilateral eyes with second dose of Rituxan early and had to get more premedications and was not rechallenged on the same day.  Patient subsequently received cycle 3 of Bendamustine Rituxan chemotherapy after Rituxan was given as a split dose over 3 days   Scans after 4 cycles of Bendamustine Rituxan chemotherapy showedSignificant improvement with therapy.  Lymphadenopathy in his neck and mediastinum resolved Deauville 2.  Decrease in the splenic size and hypermetabolism.  No new lesions.  FDG accumulation within the skeleton presumably due to stimulatory effects of treatment.   Scans in April 2023 showed pulmonary nodules requiring further assessment and patient was seen by pulmonary.  He also had a PET CT scan in October 2023 which showed gradual enlargement and hypermetabolic some noted in the lung nodules.  Patient also has intra-abdominal lymph nodes ranging from 9 to 1.2 cm with an SUV less than 3.  Patient had right lower lobe lung biopsy which was negative for malignancy.    Interval history-patient states that he has had a couple of  episodes where he passed out.  He sustained right ankle fracture.  At one time he was sitting in front of the TV and family member notes that he may have had a seizure-like episode.  Denies any chest pain or exertional  shortness of breath.He continues to drink alcohol regularly  ECOG PS- 1 Pain scale- 3   Review of systems- Review of Systems  Constitutional:  Positive for malaise/fatigue. Negative for chills, fever and weight loss.  HENT:  Negative for congestion, ear discharge and nosebleeds.   Eyes:  Negative for blurred vision.  Respiratory:  Negative for cough, hemoptysis, sputum production, shortness of breath and wheezing.   Cardiovascular:  Negative for chest pain, palpitations, orthopnea and claudication.  Gastrointestinal:  Negative for abdominal pain, blood in stool, constipation, diarrhea, heartburn, melena, nausea and vomiting.  Genitourinary:  Negative for dysuria, flank pain, frequency, hematuria and urgency.  Musculoskeletal:  Negative for back pain, joint pain and myalgias.       Right ankle pain  Skin:  Negative for rash.  Neurological:  Negative for dizziness, tingling, focal weakness, seizures, weakness and headaches.  Endo/Heme/Allergies:  Does not bruise/bleed easily.  Psychiatric/Behavioral:  Negative for depression and suicidal ideas. The patient does not have insomnia.       Allergies  Allergen Reactions   Penicillins Anaphylaxis    Tolerates cefdinir, cephalexin and amoxicillin/clavulonate so true PCN allergy unlikely   Tetanus Toxoids Swelling   Lisinopril Cough   Losartan      Other reaction(s): Muscle Pain     Codeine Nausea Only and Nausea And Vomiting   Doxycycline Rash   Ruxience [Rituximab-Pvvr] Rash    05/06/21 pt w/ worsening rash during Ruxience infusion.  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[place])    Arthritis    Asthma    Basal cell carcinoma 01/28/2021   R upper arm, [facility] 03/04/2021   Brain 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   Chronic pain    right arm   COPD (chronic obstructive pulmonary disease) ([place])    Depression    Dyspnea    Elevated liver enzymes     Environmental and seasonal allergies    Femur fracture, left ([place]) 7062   Follicular lymphoma ([place])    History of methicillin resistant staphylococcus aureus (MRSA)    years ago   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   left neck, Moh's 05/29/21   History of SCC (squamous cell carcinoma) of skin    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   left upper back ED&C done   Hypertension    Hyponatremia    Inappropriate sinus tachycardia    Pneumonia    SCC (squamous cell carcinoma) 08/28/2021   upper chest right of midline, EDC done 09/17/2021   SCC (squamous cell carcinoma) 09/30/2022   left chest  ED&C done   Sleep apnea    does not wear CPAP ; uses humidifier instead   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   right upper back, ED&C 06/18/2022   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   right upper back ED&C done   Squamous cell carcinoma of skin 02/19/2021   L inferior mandible, treated with EDC   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/28/2021   R ant shoulder - ED&C   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/28/2021   L  upper abdomen [facility]     Past Surgical History:  Procedure Laterality Date   ANTERIOR CERVICAL DECOMP/DISCECTOMY FUSION N/A 06/17/2016   Procedure: ANTERIOR CERVICAL DECOMPRESSION FUSION, CERVICAL 3-4, CERVICAL 4-5 WITH INSTRUMENTATION AND ALLOGRAFT;  Surgeon: Phylliss Bob, MD;  Location: [place];  Service: Orthopedics;  Laterality: N/A;  ANTERIOR CERVICAL DECOMPRESSION FUSION, CERVICAL 3-4, CERVICAL 4-5 WITH INSTRUMENTATION AND ALLOGRAFT   BACK SURGERY     x3   NECK SURGERY  12/01/2007   ORIF ANKLE FRACTURE Right 12/02/2022   Procedure: OPEN REDUCTION INTERNAL FIXATION (ORIF) ANKLE FRACTURE;  Surgeon: Thornton Park, MD;  Location: [facility];  Service: Orthopedics;  Laterality: Right;   ORIF ANKLE FRACTURE Right 12/01/2022   Procedure: OPEN REDUCTION INTERNAL FIXATION (ORIF) ANKLE FRACTURE;  Surgeon:  Thornton Park, MD;  Location: [facility];  Service: Orthopedics;  Laterality: Right;   PORTA CATH INSERTION N/A 04/03/2021   Procedure: PORTA CATH INSERTION;  Surgeon: Algernon Huxley, MD;  Location: [place] [facility];  Service: Cardiovascular;  Laterality: N/A;   VIDEO BRONCHOSCOPY WITH ENDOBRONCHIAL ULTRASOUND Bilateral 10/14/2022   Procedure: VIDEO BRONCHOSCOPY WITH ENDOBRONCHIAL ULTRASOUND;  Surgeon: Tyler Pita, MD;  Location: [facility];  Service: Pulmonary;  Laterality: Bilateral;    Social History   Socioeconomic History   Marital status: Divorced    Spouse name: Not on file   Number of children: Not on file   Years of education: Not on file   Highest education level: Not on file  Occupational History   Not on file  Tobacco Use   Smoking status: Former    Types: Cigars   Smokeless tobacco: Never   Tobacco comments:    8 cigars a day 09/17/2022- khj  Vaping Use   Vaping Use: Former   Start date: 04/30/2017   Quit date: 11/30/2017  Substance and Sexual Activity   Alcohol use: Yes    Comment: beers 6 a day   Drug use: No   Sexual activity: Not Currently  Other Topics Concern   Not on file  Social History Narrative   Not on file   Social Determinants of Health   Financial Resource Strain: High Risk (01/01/2023)   Overall Financial Resource Strain (CARDIA)    Difficulty of Paying Living Expenses: Hard  Food Insecurity: No Food Insecurity (12/07/2022)   Hunger Vital Sign    Worried About Running Out of Food in the Last Year: Never true    Ran Out of Food in the Last Year: Never true  Transportation Needs: No Transportation Needs (12/07/2022)   PRAPARE - Hydrologist (Medical): No    Lack of Transportation (Non-Medical): No  Physical Activity: Inactive (01/01/2023)   Exercise Vital Sign    Days of Exercise per Week: 0 days    Minutes of Exercise per Session: 0 min  Stress: No Stress Concern Present (10/27/2021)   [place]    Feeling of Stress : Only a little  Social Connections: Socially Isolated (01/01/2023)   Social Connection and Isolation Panel [NHANES]    Frequency of Communication with Friends and Family: Three times a week    Frequency of Social Gatherings with Friends and Family: Three times a week    Attends Religious Services: Never    Active Member of Clubs or Organizations: No    Attends Archivist Meetings: Not on file    Marital Status: Divorced  Intimate Partner Violence: Not At  Risk (12/01/2022)   Humiliation, Afraid, Rape, and Kick questionnaire    Fear of Current or Ex-Partner: No    Emotionally Abused: No    Physically Abused: No    Sexually Abused: No    Family History  Problem Relation Age of Onset   Diabetes Mother    Heart attack Father    Emphysema Father    Diabetes Other      Current Outpatient Medications:    albuterol (VENTOLIN HFA) 108 (90 Base) MCG/ACT inhaler, Inhale 2 puffs into the lungs every 6 (six) hours as needed for wheezing or shortness of breath., Disp: 18 g, Rfl: 2   amLODipine (NORVASC) 2.5 MG tablet, Take 1 tablet (2.5 mg total) by mouth daily., Disp: 90 tablet, Rfl: 0   aspirin EC 325 MG tablet, Take 1 tablet (325 mg total) by mouth 2 (two) times daily., Disp: 90 tablet, Rfl: 0   atorvastatin (LIPITOR) 80 MG tablet, Take 1 tablet (80 mg total) by mouth daily. (Patient taking differently: Take 80 mg by mouth every evening.), Disp: 90 tablet, Rfl: 3   Blood Glucose Monitoring Suppl (ONE TOUCH ULTRA 2) w/Device KIT, 1 each by Does not apply route daily., Disp: 1 kit, Rfl: 0   buPROPion (WELLBUTRIN SR) 150 MG 12 hr tablet, Take 150 mg by mouth 2 (two) times daily., Disp: , Rfl:    calcipotriene (DOVONOX) 0.005 % cream, Apply topically 2 (two) times daily. Apply twice a day after fluorouracil to affected areas as directed in handout., Disp: 60 g, Rfl: 1   clonazePAM (KLONOPIN) 0.5 MG tablet, Take 0.5  mg by mouth every evening., Disp: , Rfl:    DULoxetine (CYMBALTA) 60 MG capsule, Take 60 mg by mouth every evening., Disp: , Rfl:    famotidine (PEPCID) 20 MG tablet, Take 20 mg by mouth 2 (two) times daily., Disp: , Rfl:    finasteride (PROSCAR) 5 MG tablet, Take 1 tablet (5 mg total) by mouth every evening., Disp: 90 tablet, Rfl: 0   Fluticasone-Umeclidin-Vilant (TRELEGY ELLIPTA) 100-62.5-25 MCG/ACT AEPB, Inhale 1 puff into the lungs daily., Disp: 28 each, Rfl: 11   glucose blood (ONETOUCH ULTRA) test strip, 1 each by Other route daily. Use as instructed, Disp: 100 each, Rfl: 3   Lancets (ONETOUCH ULTRASOFT) lancets, 1 each by Other route daily. Use as instructed, Disp: 100 each, Rfl: 3   loratadine (CLARITIN) 10 MG tablet, Take 10 mg by mouth every evening., Disp: , Rfl:    metoprolol succinate (TOPROL-XL) 100 MG 24 hr tablet, Take 100 mg by mouth daily., Disp: , Rfl:    mirtazapine (REMERON) 15 MG tablet, Take 15 mg by mouth at bedtime., Disp: , Rfl:    montelukast (SINGULAIR) 10 MG tablet, Take 10 mg by mouth daily., Disp: , Rfl:    mupirocin ointment (BACTROBAN) 2 %, Apply 1 Application topically daily. Apply to any open wounds until healed., Disp: 22 g, Rfl: 3   naltrexone (DEPADE) 50 MG tablet, Take 50 mg by mouth daily., Disp: , Rfl:    nicotine (NICODERM CQ - DOSED IN MG/24 HOURS) 21 mg/24hr patch, Place 1 patch (21 mg total) onto the skin daily., Disp: 28 patch, Rfl: 0   OLANZapine (ZYPREXA) 5 MG tablet, Take 5 mg by mouth at bedtime., Disp: , Rfl:    tenofovir (VIREAD) 300 MG tablet, TAKE 1 TABLET BY MOUTH ONCE DAILY, Disp: 90 tablet, Rfl: 3   fluorouracil (EFUDEX) 5 % cream, Apply topically 2 (two) times daily. Apply twice  a day to affected areas as directed in handout. (Patient not taking: Reported on 12/31/2022), Disp: 40 g, Rfl: 1   sildenafil (REVATIO) 20 MG tablet, TAKE 3-5 TABLETS BY MOUTH ONCE DAILY AS NEEDED FOR ERECTILE DYSFUNCTION (Patient not taking: Reported on 12/31/2022),  Disp: 30 tablet, Rfl: 0 No current facility-administered medications for this visit.  Facility-Administered Medications Ordered in Other Visits:    heparin lock flush 100 unit/mL, 500 Units, Intravenous, Once, Sindy Guadeloupe, MD   heparin lock flush 100 unit/mL, 500 Units, Intravenous, Once, Sindy Guadeloupe, MD   sodium chloride flush (NS) 0.9 % injection 10 mL, 10 mL, Intravenous, PRN, Sindy Guadeloupe, MD  Physical exam:  Vitals:   01/01/23 1159  BP: 121/87  Pulse: 97  Resp: 16  Temp: 97.9 F (36.6 C)  TempSrc: Tympanic   Physical Exam Cardiovascular:     Rate and Rhythm: Normal rate and regular rhythm.     Heart sounds: Normal heart sounds.  Pulmonary:     Effort: Pulmonary effort is normal.     Breath sounds: Normal breath sounds.  Abdominal:     General: Bowel sounds are normal.     Palpations: Abdomen is soft.     Comments: No palpable splenomegaly  Lymphadenopathy:     Comments: No palpable cervical, supraclavicular, axillary or inguinal adenopathy    Skin:    General: Skin is warm and dry.  Neurological:     Mental Status: He is alert and oriented to person, place, and time.         Latest Ref Rng & Units 01/01/2023   11:22 AM  CMP  Glucose 70 - 99 mg/dL 126   BUN 6 - 20 mg/dL 7   Creatinine 0.61 - 1.24 mg/dL 0.86   Sodium 135 - 145 mmol/L 136   Potassium 3.5 - 5.1 mmol/L 4.6   Chloride 98 - 111 mmol/L 102   CO2 22 - 32 mmol/L 22   Calcium 8.9 - 10.3 mg/dL 8.8   Total Protein 6.5 - 8.1 g/dL 6.2   Total Bilirubin 0.3 - 1.2 mg/dL 0.5   Alkaline Phos 38 - 126 U/L 164   AST 15 - 41 U/L 30   ALT 0 - 44 U/L 28       Latest Ref Rng & Units 01/01/2023   11:22 AM  CBC  WBC 4.0 - 10.5 K/uL 6.1   Hemoglobin 13.0 - 17.0 g/dL 14.1   Hematocrit 39.0 - 52.0 % 40.5   Platelets 150 - 400 K/uL 144     No images are attached to the encounter.  NM PET Image Restag (PS) Skull Base To Thigh  Result Date: 12/24/2022 CLINICAL DATA:  Subsequent treatment strategy for  follicular. EXAM: NUCLEAR MEDICINE PET SKULL BASE TO THIGH TECHNIQUE: 13.37 mCi F-18 FDG was injected intravenously. Full-ring PET imaging was performed from the skull base to thigh after the radiotracer. CT data was obtained and used for attenuation correction and anatomic localization. Fasting blood glucose: 103 mg/dl COMPARISON:  Numerous prior examinations. The most recent PET-CT 09/29/2022. FINDINGS: Mediastinal blood pool activity: SUV max 1.43 Liver activity: SUV max 2.34 NECK: No hypermetabolic lymph nodes in the neck. Incidental CT findings: None. CHEST: Small cluster right subclavicular nodes are mildly hypermetabolic with SUV max of 5.99. Small cluster right paratracheal nodes demonstrate mild hypermetabolism. The largest node measures 10 mm on image 84/2 and has an SUV max of 2.84. Previously measured 7 mm and SUV max was 1.66. Persistent  but smaller bilateral lower lobe pulmonary nodules. Bilobed right lower lobe lesion measures 20 mm and previously measured 26 mm. Left lower lobe nodule on image 133/2 measures 17 mm and previously measured 20 mm. SUV max is 2.77. Incidental CT findings: Stable aortic and coronary artery calcifications. ABDOMEN/PELVIS: The spleen is now hypermetabolic with SUV max of 7.03 suggesting splenic lymphoma. No hepatic lesions. Interval slight enlargement retroperitoneal lymph nodes. Two adjacent L-shaped inter aortocaval nodes on image 194/2 measures 21 x 20 mm and had an SUV max of 3.56. This previously measured 18 x 16 mm and had an SUV max of 1.79. Right common iliac node measures 12 mm and has an SUV max of 3.14. This previously measured 7 mm and SUV max was 1.16. Right external iliac node measures 10 mm and has an SUV max of 4.11 this previously measured 6 mm and had an SUV max of 1.15. Right obturator node measures 16 mm and has an SUV max of 4.75. This previously measured 10 mm and had an SUV max of 1.90. Small bilateral inguinal lymph nodes are not hypermetabolic. The  left-sided node has an SUV max of 3.60 and was previously 1.78. Incidental CT findings: Stable aortic and branch vessel calcifications. SKELETON: Hypermetabolism is noted in the left fourth, fifth and sixth anterior ribs. No discrete lesions are identified and although I could not exclude the possibility of osseous lymphoma this is more likely posttraumatic change. Remote healed right-sided rib fractures are noted. Incidental CT findings: None. IMPRESSION: 1. New hypermetabolism involving the spleen and enlarging and hypermetabolic right subclavicular, mediastinal, retroperitoneal and pelvic lymph nodes consistent with recurrent lymphoma. 2. Persistent but smaller bilateral lower lobe pulmonary nodules. 3. Hypermetabolism in the left fourth, fifth and sixth anterior ribs is most likely posttraumatic change. 4. Stable atherosclerotic calcifications involving the aorta and coronary arteries. 5. Aortic atherosclerosis. Aortic Atherosclerosis (ICD10-I70.0). Electronically Signed   By: Marijo Sanes M.D.   On: 12/24/2022 11:39     Assessment and plan- Patient is a 62 y.o. male here for routine f/u of follicular lymphoma  Patient received 6 cycles of Bendamustine RituxanTherapy ending in October 2022.  Following treatment he had a repeat PET scan which showed 5 to 9 mm lung nodules with no significant metabolic activity.  Spleen was normal in size at that time with normal metabolic activity.  Lymph nodes in the abdomen were also not hypermetabolic.  Subsequent CT scan in April 2023 showed mild increase in the size of lung nodules spleen again was normal and lymph nodes appeared normal PET CT scan in October 2023 showed continued increase in the size of lung nodules with the right lower lobe lung nodule measuring 2.5 cm in the left lower lobe nodule measuring 1.7 cm mild hypermetabolism was noted in the intra-abdominal lymph nodes measuring from 8 mm to 1.2 cm concerning for progressive lymphoma.  I have reviewed  his recent PET CT scan images independently and discussed findings with the patient lung nodules at this time appears smaller than before with an SUV of 2.7.  However there continues to be growth in his intra-abdominal lymph nodes which measure up to 2 cm with an SUV max of 3.5.  Spleen also appears hypermetabolic with an SUV of 4.7.  Follicular lymphoma appears to be progressing again but he does not have any significant splenomegaly or bulky adenopathy that would warrant retreatment at this time.I am planning to repeat CT scans in 3 months and decide whether he needs treatment at  that time.   Syncope- etiology unclear. He does have significant alcohol history. I am refrring him to neurology at this time   Visit Diagnosis 1. 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 Randa Evens, MD, MPH [facility] [phone] 01/02/2023 11:32 AM

## 2023-01-03 ENCOUNTER — Other Ambulatory Visit: Payer: Self-pay | Admitting: *Deleted

## 2023-01-03 DIAGNOSIS — R55 Syncope and collapse: Secondary | ICD-10-CM

## 2023-01-03 DIAGNOSIS — C8202 Follicular lymphoma grade I, intrathoracic lymph nodes: Secondary | ICD-10-CM

## 2023-01-04 DIAGNOSIS — I4711 Inappropriate sinus tachycardia, so stated: Secondary | ICD-10-CM | POA: Diagnosis not present

## 2023-01-04 DIAGNOSIS — G4730 Sleep apnea, unspecified: Secondary | ICD-10-CM | POA: Diagnosis not present

## 2023-01-04 DIAGNOSIS — S8263XD Displaced fracture of lateral malleolus of unspecified fibula, subsequent encounter for closed fracture with routine healing: Secondary | ICD-10-CM | POA: Diagnosis not present

## 2023-01-04 DIAGNOSIS — I5032 Chronic diastolic (congestive) heart failure: Secondary | ICD-10-CM | POA: Diagnosis not present

## 2023-01-04 DIAGNOSIS — E871 Hypo-osmolality and hyponatremia: Secondary | ICD-10-CM | POA: Diagnosis not present

## 2023-01-04 DIAGNOSIS — S82851D Displaced trimalleolar fracture of right lower leg, subsequent encounter for closed fracture with routine healing: Secondary | ICD-10-CM | POA: Diagnosis not present

## 2023-01-04 DIAGNOSIS — E8721 Acute metabolic acidosis: Secondary | ICD-10-CM | POA: Diagnosis not present

## 2023-01-04 DIAGNOSIS — G894 Chronic pain syndrome: Secondary | ICD-10-CM | POA: Diagnosis not present

## 2023-01-04 DIAGNOSIS — I110 Hypertensive heart disease with heart failure: Secondary | ICD-10-CM | POA: Diagnosis not present

## 2023-01-04 MED ORDER — TESTOSTERONE CYPIONATE 200 MG/ML IM SOLN: 200.0000 mg | INTRAMUSCULAR | 0 refills | Status: DC

## 2023-01-04 NOTE — Telephone Encounter (Signed)
Sent in enough to last him until his next appointment

## 2023-01-05 DIAGNOSIS — E8721 Acute metabolic acidosis: Secondary | ICD-10-CM | POA: Diagnosis not present

## 2023-01-05 DIAGNOSIS — I4711 Inappropriate sinus tachycardia, so stated: Secondary | ICD-10-CM | POA: Diagnosis not present

## 2023-01-05 DIAGNOSIS — G894 Chronic pain syndrome: Secondary | ICD-10-CM | POA: Diagnosis not present

## 2023-01-05 DIAGNOSIS — S82851D Displaced trimalleolar fracture of right lower leg, subsequent encounter for closed fracture with routine healing: Secondary | ICD-10-CM | POA: Diagnosis not present

## 2023-01-05 DIAGNOSIS — S8263XD Displaced fracture of lateral malleolus of unspecified fibula, subsequent encounter for closed fracture with routine healing: Secondary | ICD-10-CM | POA: Diagnosis not present

## 2023-01-05 DIAGNOSIS — I5032 Chronic diastolic (congestive) heart failure: Secondary | ICD-10-CM | POA: Diagnosis not present

## 2023-01-05 DIAGNOSIS — I110 Hypertensive heart disease with heart failure: Secondary | ICD-10-CM | POA: Diagnosis not present

## 2023-01-05 DIAGNOSIS — E871 Hypo-osmolality and hyponatremia: Secondary | ICD-10-CM | POA: Diagnosis not present

## 2023-01-05 DIAGNOSIS — G4730 Sleep apnea, unspecified: Secondary | ICD-10-CM | POA: Diagnosis not present

## 2023-01-06 DIAGNOSIS — G894 Chronic pain syndrome: Secondary | ICD-10-CM | POA: Diagnosis not present

## 2023-01-06 DIAGNOSIS — E871 Hypo-osmolality and hyponatremia: Secondary | ICD-10-CM | POA: Diagnosis not present

## 2023-01-06 DIAGNOSIS — S82851D Displaced trimalleolar fracture of right lower leg, subsequent encounter for closed fracture with routine healing: Secondary | ICD-10-CM | POA: Diagnosis not present

## 2023-01-06 DIAGNOSIS — G4730 Sleep apnea, unspecified: Secondary | ICD-10-CM | POA: Diagnosis not present

## 2023-01-06 DIAGNOSIS — I110 Hypertensive heart disease with heart failure: Secondary | ICD-10-CM | POA: Diagnosis not present

## 2023-01-06 DIAGNOSIS — I4711 Inappropriate sinus tachycardia, so stated: Secondary | ICD-10-CM | POA: Diagnosis not present

## 2023-01-06 DIAGNOSIS — E8721 Acute metabolic acidosis: Secondary | ICD-10-CM | POA: Diagnosis not present

## 2023-01-06 DIAGNOSIS — S8263XD Displaced fracture of lateral malleolus of unspecified fibula, subsequent encounter for closed fracture with routine healing: Secondary | ICD-10-CM | POA: Diagnosis not present

## 2023-01-06 DIAGNOSIS — I5032 Chronic diastolic (congestive) heart failure: Secondary | ICD-10-CM | POA: Diagnosis not present

## 2023-01-07 ENCOUNTER — Encounter: Payer: Self-pay | Admitting: Dermatology

## 2023-01-07 NOTE — Progress Notes (Signed)
Called to speak with patient, left him a voicemail asking that he return my call.

## 2023-01-11 ENCOUNTER — Telehealth: Payer: Self-pay

## 2023-01-11 DIAGNOSIS — G894 Chronic pain syndrome: Secondary | ICD-10-CM | POA: Diagnosis not present

## 2023-01-11 DIAGNOSIS — E871 Hypo-osmolality and hyponatremia: Secondary | ICD-10-CM | POA: Diagnosis not present

## 2023-01-11 DIAGNOSIS — I4711 Inappropriate sinus tachycardia, so stated: Secondary | ICD-10-CM | POA: Diagnosis not present

## 2023-01-11 DIAGNOSIS — S82851D Displaced trimalleolar fracture of right lower leg, subsequent encounter for closed fracture with routine healing: Secondary | ICD-10-CM | POA: Diagnosis not present

## 2023-01-11 DIAGNOSIS — C44319 Basal cell carcinoma of skin of other parts of face: Secondary | ICD-10-CM

## 2023-01-11 DIAGNOSIS — E8721 Acute metabolic acidosis: Secondary | ICD-10-CM | POA: Diagnosis not present

## 2023-01-11 DIAGNOSIS — S8263XD Displaced fracture of lateral malleolus of unspecified fibula, subsequent encounter for closed fracture with routine healing: Secondary | ICD-10-CM | POA: Diagnosis not present

## 2023-01-11 DIAGNOSIS — I5032 Chronic diastolic (congestive) heart failure: Secondary | ICD-10-CM | POA: Diagnosis not present

## 2023-01-11 DIAGNOSIS — G4730 Sleep apnea, unspecified: Secondary | ICD-10-CM | POA: Diagnosis not present

## 2023-01-11 DIAGNOSIS — I110 Hypertensive heart disease with heart failure: Secondary | ICD-10-CM | POA: Diagnosis not present

## 2023-01-11 NOTE — Telephone Encounter (Signed)
-----   Message from Alfonso Patten, MD sent at 01/07/2023  5:43 PM EST ----- Skin , right temporal hair line BASAL CELL CARCINOMA, NODULAR PATTERN --> Mohs surgery at clinic of choice  MAs please call with results and refer. Please let me know if they have any questions. Thank you!

## 2023-01-11 NOTE — Telephone Encounter (Signed)
Patient advised of biopsy result and MOHs referral to [facility] per patient's request. aw

## 2023-01-11 NOTE — Telephone Encounter (Signed)
Called patient. N/A. LMOVM to return my call to discuss pathology results and Mohs clinic preference.

## 2023-01-11 NOTE — Addendum Note (Signed)
Addended by: Johnsie Kindred R on: 01/11/2023 04:47 PM   Modules accepted: Orders

## 2023-01-12 DIAGNOSIS — E871 Hypo-osmolality and hyponatremia: Secondary | ICD-10-CM | POA: Diagnosis not present

## 2023-01-12 DIAGNOSIS — S82851D Displaced trimalleolar fracture of right lower leg, subsequent encounter for closed fracture with routine healing: Secondary | ICD-10-CM | POA: Diagnosis not present

## 2023-01-12 DIAGNOSIS — S8263XD Displaced fracture of lateral malleolus of unspecified fibula, subsequent encounter for closed fracture with routine healing: Secondary | ICD-10-CM | POA: Diagnosis not present

## 2023-01-12 DIAGNOSIS — E8721 Acute metabolic acidosis: Secondary | ICD-10-CM | POA: Diagnosis not present

## 2023-01-12 DIAGNOSIS — I5032 Chronic diastolic (congestive) heart failure: Secondary | ICD-10-CM | POA: Diagnosis not present

## 2023-01-12 DIAGNOSIS — G894 Chronic pain syndrome: Secondary | ICD-10-CM | POA: Diagnosis not present

## 2023-01-12 DIAGNOSIS — G4730 Sleep apnea, unspecified: Secondary | ICD-10-CM | POA: Diagnosis not present

## 2023-01-12 DIAGNOSIS — I110 Hypertensive heart disease with heart failure: Secondary | ICD-10-CM | POA: Diagnosis not present

## 2023-01-12 DIAGNOSIS — I4711 Inappropriate sinus tachycardia, so stated: Secondary | ICD-10-CM | POA: Diagnosis not present

## 2023-01-13 DIAGNOSIS — S8261XD Displaced fracture of lateral malleolus of right fibula, subsequent encounter for closed fracture with routine healing: Secondary | ICD-10-CM | POA: Diagnosis not present

## 2023-01-14 DIAGNOSIS — C44319 Basal cell carcinoma of skin of other parts of face: Secondary | ICD-10-CM | POA: Diagnosis not present

## 2023-01-18 DIAGNOSIS — S8263XD Displaced fracture of lateral malleolus of unspecified fibula, subsequent encounter for closed fracture with routine healing: Secondary | ICD-10-CM | POA: Diagnosis not present

## 2023-01-18 DIAGNOSIS — G4730 Sleep apnea, unspecified: Secondary | ICD-10-CM | POA: Diagnosis not present

## 2023-01-18 DIAGNOSIS — I4711 Inappropriate sinus tachycardia, so stated: Secondary | ICD-10-CM | POA: Diagnosis not present

## 2023-01-18 DIAGNOSIS — G894 Chronic pain syndrome: Secondary | ICD-10-CM | POA: Diagnosis not present

## 2023-01-18 DIAGNOSIS — E871 Hypo-osmolality and hyponatremia: Secondary | ICD-10-CM | POA: Diagnosis not present

## 2023-01-18 DIAGNOSIS — S82851D Displaced trimalleolar fracture of right lower leg, subsequent encounter for closed fracture with routine healing: Secondary | ICD-10-CM | POA: Diagnosis not present

## 2023-01-18 DIAGNOSIS — E8721 Acute metabolic acidosis: Secondary | ICD-10-CM | POA: Diagnosis not present

## 2023-01-18 DIAGNOSIS — I5032 Chronic diastolic (congestive) heart failure: Secondary | ICD-10-CM | POA: Diagnosis not present

## 2023-01-18 DIAGNOSIS — I110 Hypertensive heart disease with heart failure: Secondary | ICD-10-CM | POA: Diagnosis not present

## 2023-01-25 DIAGNOSIS — S8263XD Displaced fracture of lateral malleolus of unspecified fibula, subsequent encounter for closed fracture with routine healing: Secondary | ICD-10-CM | POA: Diagnosis not present

## 2023-01-25 DIAGNOSIS — E8721 Acute metabolic acidosis: Secondary | ICD-10-CM | POA: Diagnosis not present

## 2023-01-25 DIAGNOSIS — S82851D Displaced trimalleolar fracture of right lower leg, subsequent encounter for closed fracture with routine healing: Secondary | ICD-10-CM | POA: Diagnosis not present

## 2023-01-25 DIAGNOSIS — I5032 Chronic diastolic (congestive) heart failure: Secondary | ICD-10-CM | POA: Diagnosis not present

## 2023-01-25 DIAGNOSIS — G4730 Sleep apnea, unspecified: Secondary | ICD-10-CM | POA: Diagnosis not present

## 2023-01-25 DIAGNOSIS — E871 Hypo-osmolality and hyponatremia: Secondary | ICD-10-CM | POA: Diagnosis not present

## 2023-01-25 DIAGNOSIS — I110 Hypertensive heart disease with heart failure: Secondary | ICD-10-CM | POA: Diagnosis not present

## 2023-01-25 DIAGNOSIS — G894 Chronic pain syndrome: Secondary | ICD-10-CM | POA: Diagnosis not present

## 2023-01-25 DIAGNOSIS — I4711 Inappropriate sinus tachycardia, so stated: Secondary | ICD-10-CM | POA: Diagnosis not present

## 2023-01-26 ENCOUNTER — Telehealth: Payer: Self-pay

## 2023-01-26 NOTE — Telephone Encounter (Signed)
Updated specimen tracking and history from [facility] progress notes. aw

## 2023-01-28 ENCOUNTER — Ambulatory Visit: Payer: Medicare HMO | Admitting: Pulmonary Disease

## 2023-01-28 ENCOUNTER — Encounter: Payer: Self-pay | Admitting: Pulmonary Disease

## 2023-01-28 VITALS — BP 124/84 | HR 96 | Temp 97.7°F | Ht 74.0 in | Wt 237.6 lb

## 2023-01-28 DIAGNOSIS — R053 Chronic cough: Secondary | ICD-10-CM

## 2023-01-28 DIAGNOSIS — C8200 Follicular lymphoma grade I, unspecified site: Secondary | ICD-10-CM

## 2023-01-28 DIAGNOSIS — R918 Other nonspecific abnormal finding of lung field: Secondary | ICD-10-CM | POA: Diagnosis not present

## 2023-01-28 DIAGNOSIS — J449 Chronic obstructive pulmonary disease, unspecified: Secondary | ICD-10-CM

## 2023-01-28 LAB — NITRIC OXIDE: Nitric Oxide: 14

## 2023-01-28 MED ORDER — ALBUTEROL SULFATE HFA 108 (90 BASE) MCG/ACT IN AERS: 2.0000 | INHALATION_SPRAY | Freq: Four times a day (QID) | RESPIRATORY_TRACT | 6 refills | Status: DC | PRN

## 2023-01-28 MED ORDER — TRELEGY ELLIPTA 200-62.5-25 MCG/ACT IN AEPB: 1.0000 | INHALATION_SPRAY | Freq: Every day | RESPIRATORY_TRACT | 11 refills | Status: DC

## 2023-01-28 MED ORDER — TRELEGY ELLIPTA 200-62.5-25 MCG/ACT IN AEPB: 1.0000 | INHALATION_SPRAY | Freq: Every day | RESPIRATORY_TRACT | 0 refills | Status: DC

## 2023-01-28 NOTE — Progress Notes (Signed)
Subjective:    Patient ID: Russell Simpson, male    DOB: 1961/02/07, 62 y.o.   MRN: [id number] Patient Care Team: Jon Billings, NP as PCP - General (Nurse Practitioner) Kate Sable, MD as PCP - Cardiology (Cardiology) Sindy Guadeloupe, MD as Consulting Physician (Oncology) Tyler Pita, MD as Consulting Physician (Pulmonary Disease)  Chief Complaint  Patient presents with   Follow-up    No SOB or wheezing. Cough with thick clear sputum when he lays down at night.     HPI Russell Simpson is a 62 year old recent former smoker with a history as noted below, who follows up on the issue of lung nodules and COPD.  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  On surveillance CT chest performed on 23 March 2022 the patient was noted to have bibasilar lung nodules that were suspicious for metastatic disease however uncertain whether related to lymphoma or squamous cell skin cancer.  Both of this circumstances would be exceedingly rare however no other area of primary malignancy is noted.  Patient has been treated for his follicular lymphoma with 4 cycles of Bendamustine Rituxan.  His lymphadenopathy responded to this.  Treatment was necessary due to severe splenomegaly.  Dr. Janese Banks discussed the findings with the patient on 3 May, however the patient kept postponing evaluation here until October.  At that point the patient underwent repeat CT chest and PET/CT.  The findings were consistent with possible follicular lymphoma.  However biopsy was warranted.  The patient underwent robotic assisted navigational bronchoscopy on 15 November however, the results were nondiagnostic.  The flow cytometry specimen was delayed in processing and the cells were not viable.  There was significant atelectasis during the procedure as these lesions were all posterior.  The patient has had follow-up PET/CT on 23 December 2022 and  this showed actually that the lung nodules previously noted are decreasing in size and with low FDG avidity.  The spleen was hypermetabolic concerning for recurrent lymphoma.  He also was noted to have enlarging hypermetabolic right supraclavicular, mediastinal and retroperitoneal pelvic lymph nodes consistent with recurrent lymphoma.  PFTs of 18 November 2022 actually showed marked improvement from prior.  Currently the patient states that he does well for the most part with Trelegy however he does get cough towards the middle of the day which is productive of clear sputum.  This responds to albuterol.  He does not note any wheezing, no shortness of breath.  He has not been having any reflux symptoms.  He quit smoking January 2024.  He has not had any fevers, chills or sweats.  No hemoptysis.  No other concerns or complaints.  Review of Systems A 10 point review of systems was performed and it is as noted above otherwise negative.  Patient Active Problem List   Diagnosis Date Noted   High anion gap metabolic acidosis 123456   Ankle fracture 11/30/2022   COPD (chronic obstructive pulmonary disease) ([place]) 11/30/2022   Closed right ankle fracture 11/30/2022   Acute respiratory failure with hypoxia ([place]) 11/02/2022   COPD exacerbation ([place]) 11/02/2022   RSV infection 11/02/2022   Severe sepsis ([place]) 11/02/2022   HLD (hyperlipidemia) 11/02/2022   Depression with anxiety 11/02/2022   BPH (benign prostatic hyperplasia) 11/02/2022   Chronic diastolic CHF (congestive heart failure) ([place]) 11/02/2022   Hyponatremia 11/02/2022   Abnormal LFTs 11/02/2022   Obesity with body mass index (BMI)  of 30.0 to 39.9 11/02/2022   Brief loss of consciousness 11/02/2022   HTN (hypertension) 11/02/2022   Multiple lung nodules on CT 10/14/2022   Hilar adenopathy 10/14/2022   Closed fracture of metatarsal bone 09/01/2022   Sprain of ligament of tarsometatarsal joint 09/01/2022   Closed fracture of lateral  malleolus 09/01/2022   Claustrophobia 08/05/2022   Closed fracture of distal fibula 05/28/2022   Lymphoma ([place]) 04/07/2022   Non-Hodgkin's lymphoma ([place]) 01/05/2022   Depression, recurrent ([place]) 01/05/2022   Tobacco abuse 01/05/2022   Gout 01/05/2022   Subacute cough 11/10/2021   Sinus congestion 10/30/2021   Hyperglycemia 08/26/2021   Squamous cell carcinoma of neck    Chemotherapy induced neutropenia (HCC)    Stage 2 moderate COPD by GOLD classification ([place]) 05/14/2021   Cellulitis 05/14/2021   Cellulitis, neck 05/14/2021   COVID-19 virus infection 05/14/2021   Alcohol use disorder, severe, dependence ([place]) [id number]   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 ([place]) 04/03/2021   Aortic atherosclerosis ([place]) 02/27/2021   High risk medication use 03/21/2020   Noncompliance with treatment regimen 02/19/2020   Erectile dysfunction due to arterial insufficiency 08/10/2019   Mixed bipolar I disorder ([place]) 07/19/2019   PTSD (post-traumatic stress disorder) 07/19/2019   Panic disorder 07/19/2019   Tobacco use disorder 07/19/2019   Moderate episode of recurrent major depressive disorder ([place]) 05/25/2019   S/P lumbar fusion 02/22/2019   Alcohol use disorder, severe, in early remission ([place]) 01/01/2019   Disorder of bursae of shoulder region 07/21/2017   Full thickness rotator cuff tear 07/21/2017   Localized, primary osteoarthritis 07/21/2017   Osteoarthritis of elbow 07/21/2017   Osteoarthritis of knee 07/21/2017   Anxiety 01/10/2016   BP (high blood pressure) 01/10/2016   Apnea, sleep 01/10/2016   Hyperlipidemia 10/15/2015   Pituitary adenoma ([place]) 10/15/2015   Hypogonadism in male 10/15/2015   Depression 10/15/2015   Impotence of organic origin 10/15/2015   Allergic rhinitis 10/15/2015   Incomplete bladder emptying 05/30/2015   Hernia, inguinal, left 09/21/2014   Benign prostatic hyperplasia with lower urinary tract symptoms 10/24/2012   Anterior pituitary  disorder ([place]) 09/05/2012   OSTEOMYELITIS, ACUTE, OTHER SPEC SITE 06/09/2006   Social History   Tobacco Use   Smoking status: Former    Types: Cigars    Quit date: 11/30/2022    Years since quitting: 0.1   Smokeless tobacco: Never  Substance Use Topics   Alcohol use: Yes    Comment: beers 6 a day   Allergies  Allergen Reactions   Penicillins Anaphylaxis    Tolerates cefdinir, cephalexin and amoxicillin/clavulonate so true PCN allergy unlikely   Tetanus Toxoids Swelling   Lisinopril Cough   Losartan      Other reaction(s): Muscle Pain     Codeine Nausea Only and Nausea And Vomiting   Doxycycline Rash   Ruxience [Rituximab-Pvvr] Rash    05/06/21 pt w/ worsening rash during Ruxience infusion.  Face, neck and chest flushing redness   Current Meds  Medication Sig   albuterol (VENTOLIN HFA) 108 (90 Base) MCG/ACT inhaler Inhale 2 puffs into the lungs every 6 (six) hours as needed for wheezing or shortness of breath.   amLODipine (NORVASC) 2.5 MG tablet Take 1 tablet (2.5 mg total) by mouth daily.   atorvastatin (LIPITOR) 80 MG tablet Take 1 tablet (80 mg total) by mouth daily. (Patient taking differently: Take 80 mg by mouth every evening.)   Blood Glucose Monitoring Suppl (ONE TOUCH ULTRA 2) w/Device KIT 1  each by Does not apply route daily.   buPROPion (WELLBUTRIN SR) 150 MG 12 hr tablet Take 150 mg by mouth 2 (two) times daily.   calcipotriene (DOVONOX) 0.005 % cream Apply topically 2 (two) times daily. Apply twice a day after fluorouracil to affected areas as directed in handout.   clonazePAM (KLONOPIN) 0.5 MG tablet Take 0.5 mg by mouth every evening.   DULoxetine (CYMBALTA) 60 MG capsule Take 60 mg by mouth every evening.   famotidine (PEPCID) 20 MG tablet Take 20 mg by mouth 2 (two) times daily.   finasteride (PROSCAR) 5 MG tablet Take 1 tablet (5 mg total) by mouth every evening.   Fluticasone-Umeclidin-Vilant (TRELEGY ELLIPTA) 100-62.5-25 MCG/ACT AEPB Inhale 1 puff into the  lungs daily.   glucose blood (ONETOUCH ULTRA) test strip 1 each by Other route daily. Use as instructed   Lancets (ONETOUCH ULTRASOFT) lancets 1 each by Other route daily. Use as instructed   loratadine (CLARITIN) 10 MG tablet Take 10 mg by mouth every evening.   metoprolol succinate (TOPROL-XL) 100 MG 24 hr tablet Take 100 mg by mouth daily.   mirtazapine (REMERON) 15 MG tablet Take 15 mg by mouth at bedtime.   montelukast (SINGULAIR) 10 MG tablet Take 10 mg by mouth daily.   mupirocin ointment (BACTROBAN) 2 % Apply 1 Application topically daily. Apply to any open wounds until healed.   naltrexone (DEPADE) 50 MG tablet Take 50 mg by mouth daily.   nicotine (NICODERM CQ - DOSED IN MG/24 HOURS) 21 mg/24hr patch Place 1 patch (21 mg total) onto the skin daily.   OLANZapine (ZYPREXA) 5 MG tablet Take 5 mg by mouth at bedtime.   sildenafil (REVATIO) 20 MG tablet TAKE 3-5 TABLETS BY MOUTH ONCE DAILY AS NEEDED FOR ERECTILE DYSFUNCTION   tenofovir (VIREAD) 300 MG tablet TAKE 1 TABLET BY MOUTH ONCE DAILY   testosterone cypionate (DEPOTESTOSTERONE CYPIONATE) 200 MG/ML injection Inject 1 mL (200 mg total) into the muscle every 14 (fourteen) days.   Immunization History  Administered Date(s) Administered   Influenza Split 08/05/2010, 09/30/2011   Influenza,inj,Quad PF,6+ Mos 09/25/2015, 08/31/2018, 08/21/2019, 08/30/2019, 09/12/2020, 10/20/2022   PFIZER Comirnaty(Gray Top)Covid-19 Tri-Sucrose Vaccine 02/27/2021   Tdap 05/29/2009       Objective:   Physical Exam BP 124/84 (BP Location: Left Wrist, Cuff Size: Normal)   Pulse 96   Temp 97.7 F (36.5 C)   Ht '6\' 2"'$  (1.88 m)   Wt 237 lb 9.6 oz (107.8 kg)   SpO2 96%   BMI 30.51 kg/m   SpO2: 96 % O2 Device: None (Room air)  GENERAL: Well-developed, overweight male fully ambulatory, no acute distress.  No conversational dyspnea. HEAD: Normocephalic, atraumatic.  EYES: Pupils equal, round, reactive to light.  No scleral icterus.  MOUTH:  Nose/mouth/throat not examined due to institutional masking requirements. NECK: Supple. No thyromegaly. Trachea midline. No JVD.  No adenopathy. PULMONARY: Good air entry bilaterally.  No adventitious sounds. CARDIOVASCULAR: S1 and S2. Regular rate and rhythm.  No rubs, murmurs or gallops heard. ABDOMEN: Benign. MUSCULOSKELETAL: No joint deformity, no clubbing, no edema.  NEUROLOGIC: No overt focal deficit, no gait disturbance, speech is fluent. SKIN: Intact,warm,dry.  Plethoric. PSYCH: Somewhat impulsive, cooperative.   Lab Results  Component Value Date   NITRICOXIDE 14 01/28/2023       Assessment & Plan:     ICD-10-CM   1. Stage 2 moderate COPD by GOLD classification ([place])  J44.9    Will increase Trelegy to the 200 strength  Refill albuterol    2. Chronic cough  R05.3 Nitric oxide   No evidence of type II inflammation in the airway Likely from poor control of COPD    3. Multiple lung nodules on CT  R91.8    These appear to be reducing in size Continue to follow expectantly    4. Follicular lymphoma grade I, unspecified body region ([place])  C82.00    This issue adds complexity to his management Follows with oncology     Orders Placed This Encounter  Procedures   Nitric oxide   Meds ordered this encounter  Medications   albuterol (VENTOLIN HFA) 108 (90 Base) MCG/ACT inhaler    Sig: Inhale 2 puffs into the lungs every 6 (six) hours as needed for wheezing or shortness of breath.    Dispense:  18 g    Refill:  6   Fluticasone-Umeclidin-Vilant (TRELEGY ELLIPTA) 200-62.5-25 MCG/ACT AEPB    Sig: Inhale 1 puff into the lungs daily.    Dispense:  14 each    Refill:  0    Order Specific Question:   Lot Number?    Answer:   [id number]    Order Specific Question:   Expiration Date?    Answer:   04/30/2024    Order Specific Question:   Quantity    Answer:   1   Fluticasone-Umeclidin-Vilant (TRELEGY ELLIPTA) 200-62.5-25 MCG/ACT AEPB    Sig: Inhale 1 puff into the lungs daily.     Dispense:  28 each    Refill:  11   The patient was instructed that we will continue to follow his lung nodules expectantly.  These appear to be reducing in size so they are likely due to inflammatory process.  We will see the patient in follow-up in 3 months time call sooner should any new problems arise.  [name], MD Advanced Bronchoscopy PCCM [facility]    *This note was dictated using voice recognition software/Dragon.  Despite best efforts to proofread, errors can occur which can change the meaning. Any transcriptional errors that result from this process are unintentional and may not be fully corrected at the time of dictation.

## 2023-01-28 NOTE — Patient Instructions (Signed)
We are going to increase the strength of your Trelegy to 200, this is 1 puff daily, make sure that you rinse your mouth well after you use it.  I sent prescription for your albuterol to the pharmacy.  The spots in your lung are getting smaller and they are not as active on the PET/CT as before so we will just have to keep an eye on them.  We will see you in follow-up in 3 months time call sooner should any new problems arise.

## 2023-01-29 DIAGNOSIS — I4711 Inappropriate sinus tachycardia, so stated: Secondary | ICD-10-CM | POA: Diagnosis not present

## 2023-01-29 DIAGNOSIS — S8263XD Displaced fracture of lateral malleolus of unspecified fibula, subsequent encounter for closed fracture with routine healing: Secondary | ICD-10-CM | POA: Diagnosis not present

## 2023-01-29 DIAGNOSIS — I110 Hypertensive heart disease with heart failure: Secondary | ICD-10-CM | POA: Diagnosis not present

## 2023-01-29 DIAGNOSIS — E871 Hypo-osmolality and hyponatremia: Secondary | ICD-10-CM | POA: Diagnosis not present

## 2023-01-29 DIAGNOSIS — E8721 Acute metabolic acidosis: Secondary | ICD-10-CM | POA: Diagnosis not present

## 2023-01-29 DIAGNOSIS — S82851D Displaced trimalleolar fracture of right lower leg, subsequent encounter for closed fracture with routine healing: Secondary | ICD-10-CM | POA: Diagnosis not present

## 2023-01-29 DIAGNOSIS — I5032 Chronic diastolic (congestive) heart failure: Secondary | ICD-10-CM | POA: Diagnosis not present

## 2023-01-29 DIAGNOSIS — G894 Chronic pain syndrome: Secondary | ICD-10-CM | POA: Diagnosis not present

## 2023-01-29 DIAGNOSIS — G4730 Sleep apnea, unspecified: Secondary | ICD-10-CM | POA: Diagnosis not present

## 2023-02-01 ENCOUNTER — Telehealth: Payer: Self-pay | Admitting: *Deleted

## 2023-02-01 ENCOUNTER — Encounter: Payer: Self-pay | Admitting: Nurse Practitioner

## 2023-02-01 ENCOUNTER — Ambulatory Visit (INDEPENDENT_AMBULATORY_CARE_PROVIDER_SITE_OTHER): Payer: Medicare HMO | Admitting: Nurse Practitioner

## 2023-02-01 VITALS — BP 95/68 | HR 110 | Temp 99.3°F | Wt 241.7 lb

## 2023-02-01 DIAGNOSIS — S8263XD Displaced fracture of lateral malleolus of unspecified fibula, subsequent encounter for closed fracture with routine healing: Secondary | ICD-10-CM | POA: Diagnosis not present

## 2023-02-01 DIAGNOSIS — G894 Chronic pain syndrome: Secondary | ICD-10-CM | POA: Diagnosis not present

## 2023-02-01 DIAGNOSIS — R55 Syncope and collapse: Secondary | ICD-10-CM

## 2023-02-01 DIAGNOSIS — I5032 Chronic diastolic (congestive) heart failure: Secondary | ICD-10-CM | POA: Diagnosis not present

## 2023-02-01 DIAGNOSIS — S82851D Displaced trimalleolar fracture of right lower leg, subsequent encounter for closed fracture with routine healing: Secondary | ICD-10-CM | POA: Diagnosis not present

## 2023-02-01 DIAGNOSIS — E871 Hypo-osmolality and hyponatremia: Secondary | ICD-10-CM | POA: Diagnosis not present

## 2023-02-01 DIAGNOSIS — G4730 Sleep apnea, unspecified: Secondary | ICD-10-CM | POA: Diagnosis not present

## 2023-02-01 DIAGNOSIS — I110 Hypertensive heart disease with heart failure: Secondary | ICD-10-CM | POA: Diagnosis not present

## 2023-02-01 DIAGNOSIS — I4711 Inappropriate sinus tachycardia, so stated: Secondary | ICD-10-CM | POA: Diagnosis not present

## 2023-02-01 DIAGNOSIS — E8721 Acute metabolic acidosis: Secondary | ICD-10-CM | POA: Diagnosis not present

## 2023-02-01 NOTE — Patient Outreach (Signed)
  Care Coordination   Follow Up Visit Note   02/01/2023 Name: Russell Simpson MRN: [id number] DOB: [birth year]  Russell Simpson is a 63 y.o. year old male who sees Jon Billings, NP for primary care. I spoke with  [name] by phone today.  What matters to the patients health and wellness today?  Continues to recover from ankle surgery, state he has follow up in the next couple weeks.  State he is to have PT, they called and left a message, he will call back.     Goals Addressed             This Visit's Progress    Attend PT to help with recovrey from ankle surgery       Care Coordination Interventions: Evaluation of current treatment plan related to recent ankle surgery and patient's adherence to plan as established by provider Advised patient to call PT back to schedule assessment.  Reviewed scheduled/upcoming provider appointments including today with PCP as well as 3/12, 3/15 at the cancer center, CT of chest and pelvis on 4/30, dermatology on 5/19, and pulmonary on 6/26 Discussed plans with patient for ongoing care management follow up and provided patient with direct contact information for care management team Does not know how to use blood glucose machine, advised to take kit to PCP appointment today to be educated on use. Discussed ways to decrease pain in ankle, elevation when not walking helps to relieve pain/discomfort        Interventions Today    Flowsheet Row Most Recent Value  Chronic Disease   Chronic disease during today's visit Other  [ankle fracture]  General Interventions   General Interventions Discussed/Reviewed General Interventions Reviewed, Doctor Visits  Doctor Visits Discussed/Reviewed Doctor Visits Discussed, PCP  PCP/Specialist Visits Compliance with follow-up visit  Exercise Interventions   Exercise Discussed/Reviewed Physical Activity  Physical Activity Discussed/Reviewed --  [PT ordered]        SDOH assessments and interventions  completed:  No     Care Coordination Interventions:  Yes, provided   Follow up plan: Follow up call scheduled for 4/1    Encounter Outcome:  Pt. Visit Completed   Valente David, RN, MSN, [facility] Care Management Care Management Coordinator [phone]

## 2023-02-01 NOTE — Assessment & Plan Note (Signed)
Improved.  Has not had any episodes since our last visit.  Improved with decrease clonazepam and Amlodipine 2.'5mg'$ .  Continue with current medication regimen.  Follow up in 3 months.  Call sooner if concerns arise.

## 2023-02-01 NOTE — Patient Instructions (Signed)
Visit Information  Thank you for taking time to visit with me today. Please don't hesitate to contact me if I can be of assistance to you before our next scheduled telephone appointment.  Following are the goals we discussed today:  Call PT back to schedule date/time for initial assessment. Take glucose meter with you to doctor's appointment to be educated on proper use.  Our next appointment is by telephone on 4/1  Please call the care guide team at [phone] if you need to cancel or reschedule your appointment.   Please call the Suicide and Crisis Lifeline: 988 call the [place] National Suicide Prevention Lifeline: 414-234-5496 or TTY: 423-205-7186 TTY 920 773 6553) to talk to a trained counselor call 1-800-273-TALK (toll free, 24 hour hotline) call 911 if you are experiencing a Mental Health or [place] or need someone to talk to.  Patient verbalizes understanding of instructions and care plan provided today and agrees to view in [place]. Active MyChart status and patient understanding of how to access instructions and care plan via MyChart confirmed with patient.     The patient has been provided with contact information for the care management team and has been advised to call with any health related questions or concerns.   [facility] Management Care Management Coordinator [phone]

## 2023-02-01 NOTE — Progress Notes (Signed)
BP 95/68   Pulse (!) 110   Temp 99.3 F (37.4 C) (Oral)   Wt 241 lb 11.2 oz (109.6 kg)   SpO2 97%   BMI 31.03 kg/m    Subjective:    Patient ID: Russell Simpson, male    DOB: 10/29/1961, 62 y.o.   MRN: [id number]  HPI: Russell Simpson is a 62 y.o. male  Chief Complaint  Patient presents with   Loss of Consciousness    1 month f/up   Pt states he hasn't had any more falls or episodes since our last visit.  States he had a pet scan and his cancer is still gone.  Last visit his amlodipine was decreased to 2.'5mg'$  and patient states his psychiatrist decrease his clonazepam usage.  Denies HA, CP, SOB, dizziness, palpitations, visual changes, and lower extremity swelling.     Relevant past medical, surgical, family and social history reviewed and updated as indicated. Interim medical history since our last visit reviewed. Allergies and medications reviewed and updated.  Review of Systems  Eyes:  Negative for visual disturbance.  Respiratory:  Negative for shortness of breath.   Cardiovascular:  Negative for chest pain and leg swelling.  Neurological:  Positive for numbness. Negative for light-headedness and headaches.       Loss of consciousness    Per HPI unless specifically indicated above     Objective:    BP 95/68   Pulse (!) 110   Temp 99.3 F (37.4 C) (Oral)   Wt 241 lb 11.2 oz (109.6 kg)   SpO2 97%   BMI 31.03 kg/m   Wt Readings from Last 3 Encounters:  02/01/23 241 lb 11.2 oz (109.6 kg)  01/28/23 237 lb 9.6 oz (107.8 kg)  11/30/22 238 lb (108 kg)    Physical Exam Vitals and nursing note reviewed.  Constitutional:      General: He is not in acute distress.    Appearance: Normal appearance. He is not ill-appearing, toxic-appearing or diaphoretic.  HENT:     Head: Normocephalic.     Right Ear: External ear normal.     Left Ear: External ear normal.     Nose: Nose normal. No congestion or rhinorrhea.     Mouth/Throat:     Mouth: Mucous membranes are moist.   Eyes:     General:        Right eye: No discharge.        Left eye: No discharge.     Extraocular Movements: Extraocular movements intact.     Conjunctiva/sclera: Conjunctivae normal.     Pupils: Pupils are equal, round, and reactive to light.  Cardiovascular:     Rate and Rhythm: Normal rate and regular rhythm.     Heart sounds: No murmur heard. Pulmonary:     Effort: Pulmonary effort is normal. No respiratory distress.     Breath sounds: Normal breath sounds. No wheezing, rhonchi or rales.  Abdominal:     General: Abdomen is flat. Bowel sounds are normal.  Musculoskeletal:     Cervical back: Normal range of motion and neck supple.  Skin:    General: Skin is warm and dry.     Capillary Refill: Capillary refill takes less than 2 seconds.  Neurological:     General: No focal deficit present.     Mental Status: He is alert and oriented to person, place, and time.     Cranial Nerves: Cranial nerves 2-12 are intact.     Sensory: Sensation is  intact.  Psychiatric:        Mood and Affect: Mood normal.        Behavior: Behavior normal.        Thought Content: Thought content normal.        Judgment: Judgment normal.     Results for orders placed or performed in visit on 01/28/23  Nitric oxide  Result Value Ref Range   Nitric Oxide 14       Assessment & Plan:   Problem List Items Addressed This Visit       Other   Brief loss of consciousness - Primary    Improved.  Has not had any episodes since our last visit.  Improved with decrease clonazepam and Amlodipine 2.'5mg'$ .  Continue with current medication regimen.  Follow up in 3 months.  Call sooner if concerns arise.         Follow up plan: Return in about 3 months (around 05/04/2023) for HTN, HLD, DM2 FU.

## 2023-02-02 DIAGNOSIS — I5032 Chronic diastolic (congestive) heart failure: Secondary | ICD-10-CM | POA: Diagnosis not present

## 2023-02-02 DIAGNOSIS — S82851D Displaced trimalleolar fracture of right lower leg, subsequent encounter for closed fracture with routine healing: Secondary | ICD-10-CM | POA: Diagnosis not present

## 2023-02-02 DIAGNOSIS — E871 Hypo-osmolality and hyponatremia: Secondary | ICD-10-CM | POA: Diagnosis not present

## 2023-02-02 DIAGNOSIS — I4711 Inappropriate sinus tachycardia, so stated: Secondary | ICD-10-CM | POA: Diagnosis not present

## 2023-02-02 DIAGNOSIS — E8721 Acute metabolic acidosis: Secondary | ICD-10-CM | POA: Diagnosis not present

## 2023-02-02 DIAGNOSIS — I110 Hypertensive heart disease with heart failure: Secondary | ICD-10-CM | POA: Diagnosis not present

## 2023-02-02 DIAGNOSIS — G4730 Sleep apnea, unspecified: Secondary | ICD-10-CM | POA: Diagnosis not present

## 2023-02-02 DIAGNOSIS — S8263XD Displaced fracture of lateral malleolus of unspecified fibula, subsequent encounter for closed fracture with routine healing: Secondary | ICD-10-CM | POA: Diagnosis not present

## 2023-02-02 DIAGNOSIS — G894 Chronic pain syndrome: Secondary | ICD-10-CM | POA: Diagnosis not present

## 2023-02-09 ENCOUNTER — Ambulatory Visit: Payer: Medicare HMO | Admitting: Nurse Practitioner

## 2023-02-12 ENCOUNTER — Telehealth: Payer: Self-pay | Admitting: Internal Medicine

## 2023-02-12 ENCOUNTER — Inpatient Hospital Stay: Payer: Medicare HMO | Admitting: Internal Medicine

## 2023-02-12 NOTE — Telephone Encounter (Signed)
Pt requested to reschedule appt today. He is not feeling well. I called and left message asking him to call us back to reschedule.

## 2023-02-12 NOTE — Telephone Encounter (Signed)
This patient sent a message through our answering service that he wasn't feeling well and has been up all night. He wants to rechedule his appt with Dr Mickeal Skinner today, 02/12/2023. Dr Mickeal Skinner and scheduling team were notified.

## 2023-02-19 DIAGNOSIS — S82851A Displaced trimalleolar fracture of right lower leg, initial encounter for closed fracture: Secondary | ICD-10-CM | POA: Diagnosis not present

## 2023-02-23 ENCOUNTER — Other Ambulatory Visit: Payer: Self-pay | Admitting: Family Medicine

## 2023-02-23 DIAGNOSIS — N401 Enlarged prostate with lower urinary tract symptoms: Secondary | ICD-10-CM

## 2023-02-23 DIAGNOSIS — E291 Testicular hypofunction: Secondary | ICD-10-CM

## 2023-02-24 ENCOUNTER — Other Ambulatory Visit: Payer: Medicare HMO

## 2023-02-24 ENCOUNTER — Encounter: Payer: Self-pay | Admitting: Urology

## 2023-02-26 ENCOUNTER — Other Ambulatory Visit: Payer: Medicare HMO

## 2023-03-01 ENCOUNTER — Telehealth: Payer: Self-pay | Admitting: *Deleted

## 2023-03-01 ENCOUNTER — Other Ambulatory Visit: Payer: Medicare HMO

## 2023-03-01 ENCOUNTER — Ambulatory Visit: Payer: Self-pay | Admitting: *Deleted

## 2023-03-01 DIAGNOSIS — M25671 Stiffness of right ankle, not elsewhere classified: Secondary | ICD-10-CM | POA: Diagnosis not present

## 2023-03-01 DIAGNOSIS — M25571 Pain in right ankle and joints of right foot: Secondary | ICD-10-CM | POA: Diagnosis not present

## 2023-03-01 NOTE — Telephone Encounter (Signed)
   Telephone encounter was:  Unsuccessful.  03/01/2023 Name: Russell Simpson MRN: [id number] DOB: Sep 27, 1961  Unsuccessful outbound call made today to assist with:  Patient called the [facility] general phone line and sent 'Patient received a voice mail stating that he has an appointment, but doesn know why. ( called and left voicemail looks like he is scheduled with care coordination if he returns my call i will informa him of the appt   Outreach Attempt:  1st Attempt  A HIPAA compliant voice message was left requesting a return call.  Instructed patient to call back at [phone].  [facility] [phone] [address] , [place] [address] Email : [email or web address]

## 2023-03-01 NOTE — Patient Outreach (Signed)
  Care Coordination   03/01/2023 Name: Russell Simpson MRN: [id number] DOB: [birth year]   Care Coordination Outreach Attempts:  An unsuccessful telephone outreach was attempted for a scheduled appointment today.  Follow Up Plan:  Additional outreach attempts will be made to offer the patient care coordination information and services.   Encounter Outcome:  No Answer   Care Coordination Interventions:  No, not indicated    Valente David, RN, MSN, [facility] Care Management Care Management Coordinator [phone]

## 2023-03-02 ENCOUNTER — Telehealth: Payer: Self-pay | Admitting: Urology

## 2023-03-02 ENCOUNTER — Other Ambulatory Visit: Payer: Medicare HMO

## 2023-03-02 DIAGNOSIS — N401 Enlarged prostate with lower urinary tract symptoms: Secondary | ICD-10-CM

## 2023-03-02 DIAGNOSIS — E291 Testicular hypofunction: Secondary | ICD-10-CM

## 2023-03-02 NOTE — Telephone Encounter (Signed)
Pt was in office today for lab work asking to speak to [facility].  I told pt Larene Beach was in clinic, but I could put in a phone message for her to give him a call.  Pt didn't state what he needed to speak with her about.

## 2023-03-03 LAB — PSA: Prostate Specific Ag, Serum: 2.3 ng/mL (ref 0.0–4.0)

## 2023-03-03 LAB — TESTOSTERONE: Testosterone: 390 ng/dL (ref 264–916)

## 2023-03-03 LAB — HEMATOCRIT: Hematocrit: 45.7 % (ref 37.5–51.0)

## 2023-03-05 ENCOUNTER — Telehealth: Payer: Self-pay | Admitting: *Deleted

## 2023-03-05 ENCOUNTER — Telehealth: Payer: Self-pay | Admitting: Urology

## 2023-03-05 NOTE — Progress Notes (Signed)
  Care Coordination Note  03/05/2023 Name: Russell Simpson MRN: [id number] DOB: September 08, 1961  Russell Simpson is a 62 y.o. year old male who is a primary care patient of [name], NP and is actively engaged with the care management team. I reached out to [name] by phone today to assist with re-scheduling a follow up visit with the RN Case Manager  Follow up plan: Unsuccessful telephone outreach attempt made. A HIPAA compliant phone message was left for the patient providing contact information and requesting a return call.   Burman Nieves, CCMA Care Coordination Care Guide Direct Dial: [phone]

## 2023-03-05 NOTE — Telephone Encounter (Signed)
Patient called and requested that Sildenafil be increased to 100 mg. He said current dosage is not working. Pharmacy is [name] in [place].

## 2023-03-07 NOTE — Telephone Encounter (Signed)
Missed last visit was 1 year ago.  Needs follow-up visit with Carollee Herter

## 2023-03-08 ENCOUNTER — Telehealth: Payer: Self-pay | Admitting: Nurse Practitioner

## 2023-03-08 ENCOUNTER — Encounter: Payer: Self-pay | Admitting: *Deleted

## 2023-03-08 ENCOUNTER — Encounter: Payer: Self-pay | Admitting: Nurse Practitioner

## 2023-03-08 ENCOUNTER — Ambulatory Visit (INDEPENDENT_AMBULATORY_CARE_PROVIDER_SITE_OTHER): Payer: Medicare HMO | Admitting: Nurse Practitioner

## 2023-03-08 VITALS — BP 135/84 | HR 98 | Temp 98.1°F | Wt 239.0 lb

## 2023-03-08 DIAGNOSIS — R051 Acute cough: Secondary | ICD-10-CM | POA: Diagnosis not present

## 2023-03-08 DIAGNOSIS — J301 Allergic rhinitis due to pollen: Secondary | ICD-10-CM | POA: Diagnosis not present

## 2023-03-08 MED ORDER — FLUTICASONE PROPIONATE 50 MCG/ACT NA SUSP: 2.0000 | Freq: Every day | NASAL | 6 refills | Status: DC

## 2023-03-08 MED ORDER — CETIRIZINE HCL 10 MG PO TABS: 10.0000 mg | ORAL_TABLET | Freq: Every day | ORAL | 11 refills | Status: DC

## 2023-03-08 NOTE — Patient Instructions (Signed)
Singulair- Montelukast

## 2023-03-08 NOTE — Telephone Encounter (Signed)
Pt is calling back to inform Clydie Braun that he is taking montelukast (SINGULAIR) 10 MG tablet [[id number]]

## 2023-03-08 NOTE — Progress Notes (Signed)
BP 135/84   Pulse 98   Temp 98.1 F (36.7 C) (Oral)   Wt 239 lb (108.4 kg)   SpO2 94%   BMI 30.69 kg/m    Subjective:    Patient ID: Russell Simpson, male    DOB: 11/29/1961, 62 y.o.   MRN: [id number]  HPI: Russell Simpson is a 62 y.o. male  Chief Complaint  Patient presents with   Cough   COUGH Duration: months (1) Circumstances of initial development of cough: unknown Cough severity: severe Cough description: hacking and dry Aggravating factors:  worse at night Alleviating factors: nothing Status:  worse Treatments attempted: mucinex Wheezing: no Shortness of breath: no Chest pain: no Chest tightness:no Nasal congestion: no Runny nose: yes Postnasal drip: yes Frequent throat clearing or swallowing: no Hemoptysis: no Fevers: no Night sweats: no Weight loss: no Heartburn: no Recent foreign travel: no Tuberculosis contacts: no Having his eyes crusted over in the morning when he wakes up. Cough is only at night when he goes to lay down.  Nasal passages are draining.   Relevant past medical, surgical, family and social history reviewed and updated as indicated. Interim medical history since our last visit reviewed. Allergies and medications reviewed and updated.  Review of Systems  Constitutional:  Negative for fever.  HENT:  Positive for congestion, postnasal drip and rhinorrhea.   Eyes:  Positive for discharge.  Respiratory:  Positive for cough. Negative for shortness of breath and wheezing.     Per HPI unless specifically indicated above     Objective:    BP 135/84   Pulse 98   Temp 98.1 F (36.7 C) (Oral)   Wt 239 lb (108.4 kg)   SpO2 94%   BMI 30.69 kg/m   Wt Readings from Last 3 Encounters:  03/08/23 239 lb (108.4 kg)  02/01/23 241 lb 11.2 oz (109.6 kg)  01/28/23 237 lb 9.6 oz (107.8 kg)    Physical Exam Vitals and nursing note reviewed.  Constitutional:      General: He is not in acute distress.    Appearance: Normal appearance. He is  not ill-appearing, toxic-appearing or diaphoretic.  HENT:     Head: Normocephalic.     Right Ear: External ear normal. A middle ear effusion is present.     Left Ear: External ear normal. A middle ear effusion is present.     Nose: Nose normal. No congestion or rhinorrhea.     Mouth/Throat:     Mouth: Mucous membranes are moist.     Pharynx: Posterior oropharyngeal erythema present.  Eyes:     General:        Right eye: No discharge.        Left eye: No discharge.     Extraocular Movements: Extraocular movements intact.     Conjunctiva/sclera: Conjunctivae normal.     Pupils: Pupils are equal, round, and reactive to light.  Cardiovascular:     Rate and Rhythm: Normal rate and regular rhythm.     Heart sounds: No murmur heard. Pulmonary:     Effort: Pulmonary effort is normal. No respiratory distress.     Breath sounds: Normal breath sounds. No wheezing, rhonchi or rales.  Abdominal:     General: Abdomen is flat. Bowel sounds are normal.  Musculoskeletal:     Cervical back: Normal range of motion and neck supple.  Skin:    General: Skin is warm and dry.     Capillary Refill: Capillary refill takes less than 2 seconds.  Neurological:     General: No focal deficit present.     Mental Status: He is alert and oriented to person, place, and time.  Psychiatric:        Mood and Affect: Mood normal.        Behavior: Behavior normal.        Thought Content: Thought content normal.        Judgment: Judgment normal.     Results for orders placed or performed in visit on 03/02/23  PSA  Result Value Ref Range   Prostate Specific Ag, Serum 2.3 0.0 - 4.0 ng/mL  Testosterone  Result Value Ref Range   Testosterone 390 264 - 916 ng/dL  Hematocrit  Result Value Ref Range   Hematocrit 45.7 37.5 - 51.0 %      Assessment & Plan:   Problem List Items Addressed This Visit       Respiratory   Allergic rhinitis - Primary    Suspect symptoms are related to allergies.  Patient unsure  whether he is taking Singulair. Recommend he let me know so I can represcribe it if he is not.  Recommend changing from claritin to Zyrtec.  Flonase sent to the pharmacy. Chest xray ordered for evaluation.       Other Visit Diagnoses     Acute cough       Relevant Orders   DG Chest 2 View        Follow up plan: Return if symptoms worsen or fail to improve.

## 2023-03-08 NOTE — Assessment & Plan Note (Signed)
Suspect symptoms are related to allergies.  Patient unsure whether he is taking Singulair. Recommend he let me know so I can represcribe it if he is not.  Recommend changing from claritin to Zyrtec.  Flonase sent to the pharmacy. Chest xray ordered for evaluation.

## 2023-03-09 NOTE — Telephone Encounter (Signed)
I recommend he continue taking the Singulair and pick up the Zyrtec and Flonase sent to the pharmacy.

## 2023-03-09 NOTE — Telephone Encounter (Signed)
Called and LVM notifying patient of [name]'s message. Asked for patient to please call back with any questions or concerns.

## 2023-03-09 NOTE — Progress Notes (Signed)
  Care Coordination Note  03/09/2023 Name: Russell Simpson MRN: [id number] DOB: 1961-04-30  Russell Simpson is a 62 y.o. year old male who is a primary care patient of [name], NP and is actively engaged with the care management team. I reached out to [name] by phone today to assist with re-scheduling a follow up visit with the RN Case Manager  Follow up plan: We have been unable to make contact with the patient for follow up.   Burman Nieves, CCMA Care Coordination Care Guide Direct Dial: [phone]

## 2023-03-12 ENCOUNTER — Ambulatory Visit: Payer: Medicare HMO | Admitting: Internal Medicine

## 2023-03-14 NOTE — Progress Notes (Deleted)
03/15/2023 6:29 PM   Russell Simpson 03/17/1961 [id number]  Referring provider: [name], NP [address] [place],  [place] [address]  Urological history: 1. Hypogonadotrophic hypogonadism -Contributing factors of a prolactin secreting pituitary tumor, alcohol consumption, sleep apnea and age -Testosterone (03/2023) 390 -Hematocrit (03/2023) 45.7 -testosterone cypionate 200 mg/milliliters, 1 cc every 14 days   2.  Erectile dysfunction -Contributing factors of hypogonadism, BPH, smoking, sleep apnea, depression, hypertension and COPD and alcohol consumption -failed PDE5i's   3. BPH with LU TS -PSA (03/2023) 2.3 -finasteride 5 mg daily    No chief complaint on file.   HPI: Russell Simpson is a 62 y.o. male who presents today for follow up.      Score: 1-7 Severe ED 8-11 Moderate ED 12-16 Mild-Moderate ED 17-21 Mild ED 22-25 No ED      Score:  1-7 Mild 8-19 Moderate 20-35 Severe    PMH: Past Medical History:  Diagnosis Date   Anterior pituitary disorder    Arthritis    Asthma    Basal cell carcinoma 01/28/2021   R upper arm, EDC 03/04/2021   Basal cell carcinoma 12/31/2022   Right temporal hair line. Nodular. 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   left upper back ED&C done   Hypertension    Hyponatremia    Inappropriate sinus tachycardia    Pneumonia    SCC (squamous cell carcinoma) 08/28/2021   upper chest right  of midline, EDC done 09/17/2021   SCC (squamous cell carcinoma) 09/30/2022   left chest  ED&C done   Sleep apnea    does not wear CPAP ; uses humidifier instead   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   right upper back, ED&C 06/18/2022   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   right upper back ED&C done   Squamous cell carcinoma of skin 02/19/2021   L inferior mandible, treated with EDC   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/28/2021   R ant shoulder - ED&C   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/28/2021   L upper abdomen - ED&C    Surgical History: Past Surgical History:  Procedure Laterality Date   ANTERIOR CERVICAL DECOMP/DISCECTOMY FUSION N/A 06/17/2016   Procedure: ANTERIOR CERVICAL DECOMPRESSION FUSION, CERVICAL 3-4, CERVICAL 4-5 WITH INSTRUMENTATION AND ALLOGRAFT;  Surgeon: Estill Bamberg, MD;  Location: [facility];  Service: Orthopedics;  Laterality: N/A;  ANTERIOR CERVICAL DECOMPRESSION FUSION, CERVICAL 3-4, CERVICAL 4-5 WITH INSTRUMENTATION AND ALLOGRAFT   BACK SURGERY     x3   NECK SURGERY  12/01/2007   ORIF ANKLE FRACTURE Right 12/02/2022   Procedure: OPEN REDUCTION INTERNAL FIXATION (ORIF) ANKLE FRACTURE;  Surgeon: Juanell Fairly, MD;  Location: [facility] ORS;  Service: Orthopedics;  Laterality: Right;   ORIF ANKLE FRACTURE Right 12/01/2022   Procedure: OPEN REDUCTION INTERNAL FIXATION (ORIF) ANKLE FRACTURE;  Surgeon:  Juanell Fairly, MD;  Location: [facility] ORS;  Service: Orthopedics;  Laterality: Right;   PORTA CATH INSERTION N/A 04/03/2021   Procedure: PORTA CATH INSERTION;  Surgeon: Annice Needy, MD;  Location: [facility];  Service: Cardiovascular;  Laterality: N/A;   VIDEO BRONCHOSCOPY WITH ENDOBRONCHIAL ULTRASOUND Bilateral 10/14/2022   Procedure: VIDEO BRONCHOSCOPY WITH ENDOBRONCHIAL ULTRASOUND;  Surgeon: Salena Saner, MD;  Location: [facility] OR[facility];  Service: Pulmonary;  Laterality: Bilateral;    Home Medications:  Allergies as of 03/15/2023        Reactions   Penicillins Anaphylaxis   Tolerates cefdinir, cephalexin and amoxicillin/clavulonate so true PCN allergy unlikely   Tetanus Toxoids Swelling   Lisinopril Cough   Losartan    Other reaction(s): Muscle Pain   Codeine Nausea Only, Nausea And Vomiting   Doxycycline Rash   Ruxience [rituximab-pvvr] Rash   05/06/21 pt w/ worsening rash during Ruxience infusion.  Face, neck and chest flushing redness        Medication List        Accurate as of March 14, 2023  6:29 PM. If you have any questions, ask your nurse or doctor.          albuterol 108 (90 Base) MCG/ACT inhaler Commonly known as: VENTOLIN HFA Inhale 2 puffs into the lungs every 6 (six) hours as needed for wheezing or shortness of breath.   amLODipine 2.5 MG tablet Commonly known as: NORVASC Take 1 tablet (2.5 mg total) by mouth daily.   atorvastatin 80 MG tablet Commonly known as: LIPITOR Take 1 tablet (80 mg total) by mouth daily. What changed: when to take this   buPROPion 150 MG 12 hr tablet Commonly known as: WELLBUTRIN SR Take 150 mg by mouth 2 (two) times daily.   calcipotriene 0.005 % cream Commonly known as: DOVONOX Apply topically 2 (two) times daily. Apply twice a day after fluorouracil to affected areas as directed in handout.   cetirizine 10 MG tablet Commonly known as: ZYRTEC Take 1 tablet (10 mg total) by mouth daily.   clonazePAM 0.5 MG tablet Commonly known as: KLONOPIN Take 0.5 mg by mouth every evening.   DULoxetine 60 MG capsule Commonly known as: CYMBALTA Take 60 mg by mouth every evening.   famotidine 20 MG tablet Commonly known as: PEPCID Take 20 mg by mouth 2 (two) times daily.   finasteride 5 MG tablet Commonly known as: PROSCAR Take 1 tablet (5 mg total) by mouth every evening.   fluticasone 50 MCG/ACT nasal spray Commonly known as: FLONASE Place 2 sprays into both nostrils daily.   metoprolol succinate 100 MG 24 hr tablet Commonly known as: TOPROL-XL Take  100 mg by mouth daily.   mirtazapine 15 MG tablet Commonly known as: REMERON Take 15 mg by mouth at bedtime.   montelukast 10 MG tablet Commonly known as: SINGULAIR Take 10 mg by mouth daily.   mupirocin ointment 2 % Commonly known as: BACTROBAN Apply 1 Application topically daily. Apply to any open wounds until healed.   naltrexone 50 MG tablet Commonly known as: DEPADE Take 50 mg by mouth daily.   nicotine 21 mg/24hr patch Commonly known as: NICODERM CQ - dosed in mg/24 hours Place 1 patch (21 mg total) onto the skin daily.   OLANZapine 5 MG tablet Commonly known as: ZYPREXA Take 5 mg by mouth at bedtime.   ONE TOUCH ULTRA 2 w/Device Kit 1 each by Does not apply route daily.   OneTouch Ultra test strip Generic drug:  glucose blood 1 each by Other route daily. Use as instructed   onetouch ultrasoft lancets 1 each by Other route daily. Use as instructed   sildenafil 20 MG tablet Commonly known as: REVATIO TAKE 3-5 TABLETS BY MOUTH ONCE DAILY AS NEEDED FOR ERECTILE DYSFUNCTION   tenofovir 300 MG tablet Commonly known as: VIREAD TAKE 1 TABLET BY MOUTH ONCE DAILY   testosterone cypionate 200 MG/ML injection Commonly known as: DEPOTESTOSTERONE CYPIONATE Inject 1 mL (200 mg total) into the muscle every 14 (fourteen) days.   traMADol 50 MG tablet Commonly known as: ULTRAM Take 50 mg by mouth every 6 (six) hours as needed.   Trelegy Ellipta 200-62.5-25 MCG/ACT Aepb Generic drug: Fluticasone-Umeclidin-Vilant Inhale 1 puff into the lungs daily.   Trelegy Ellipta 200-62.5-25 MCG/ACT Aepb Generic drug: Fluticasone-Umeclidin-Vilant Inhale 1 puff into the lungs daily.        Allergies:  Allergies  Allergen Reactions   Penicillins Anaphylaxis    Tolerates cefdinir, cephalexin and amoxicillin/clavulonate so true PCN allergy unlikely   Tetanus Toxoids Swelling   Lisinopril Cough   Losartan      Other reaction(s): Muscle Pain     Codeine Nausea Only and  Nausea And Vomiting   Doxycycline Rash   Ruxience [Rituximab-Pvvr] Rash    05/06/21 pt w/ worsening rash during Ruxience infusion.  Face, neck and chest flushing redness    Family History: Family History  Problem Relation Age of Onset   Diabetes Mother    Heart attack Father    Emphysema Father    Diabetes Other     Social History:  reports that he quit smoking about 3 months ago. His smoking use included cigars. He has never used smokeless tobacco. He reports current alcohol use. He reports that he does not use drugs.  ROS: Pertinent ROS in HPI  Physical Exam: There were no vitals taken for this visit.  Constitutional:  Well nourished. Alert and oriented, No acute distress. HEENT: [place] AT, moist mucus membranes.  Trachea midline, no masses. Cardiovascular: No clubbing, cyanosis, or edema. Respiratory: Normal respiratory effort, no increased work of breathing. GI: Abdomen is soft, non tender, non distended, no abdominal masses. Liver and spleen not palpable.  No hernias appreciated.  Stool sample for occult testing is not indicated.   GU: No CVA tenderness.  No bladder fullness or masses.  Patient with circumcised/uncircumcised phallus. ***Foreskin easily retracted***  Urethral meatus is patent.  No penile discharge. No penile lesions or rashes. Scrotum without lesions, cysts, rashes and/or edema.  Testicles are located scrotally bilaterally. No masses are appreciated in the testicles. Left and right epididymis are normal. Rectal: Patient with  normal sphincter tone. Anus and perineum without scarring or rashes. No rectal masses are appreciated. Prostate is approximately *** grams, *** nodules are appreciated. Seminal vesicles are normal. Skin: No rashes, bruises or suspicious lesions. Lymph: No cervical or inguinal adenopathy. Neurologic: Grossly intact, no focal deficits, moving all 4 extremities. Psychiatric: Normal mood and affect.  Laboratory Data: Lab Results  Component Value  Date   WBC 6.1 01/01/2023   HGB 14.1 01/01/2023   HCT 45.7 03/02/2023   MCV 90.8 01/01/2023   PLT 144 (L) 01/01/2023    Lab Results  Component Value Date   CREATININE 0.86 01/01/2023     Lab Results  Component Value Date   TESTOSTERONE 390 03/02/2023    Lab Results  Component Value Date   HGBA1C 6.2 (H) 12/30/2022        Component Value Date/Time  CHOL 164 06/23/2022 1327   CHOL 142 04/07/2022 1442   HDL 43 06/23/2022 1327   HDL 35 (L) 04/07/2022 1442   CHOLHDL 3.8 06/23/2022 1327   VLDL 19 06/23/2022 1327   LDLCALC 102 (H) 06/23/2022 1327   LDLCALC 88 04/07/2022 1442   LDLCALC 108 (H) 10/15/2017 0906    Lab Results  Component Value Date   AST 30 01/01/2023   Lab Results  Component Value Date   ALT 28 01/01/2023    Urinalysis    Component Value Date/Time   COLORURINE STRAW (A) 11/30/2022 1522   APPEARANCEUR CLEAR (A) 11/30/2022 1522   APPEARANCEUR Clear 04/07/2022 1440   LABSPEC 1.005 11/30/2022 1522   LABSPEC 1.017 07/19/2013 0635   PHURINE 5.0 11/30/2022 1522   GLUCOSEU NEGATIVE 11/30/2022 1522   GLUCOSEU Negative 07/19/2013 0635   HGBUR NEGATIVE 11/30/2022 1522   BILIRUBINUR NEGATIVE 11/30/2022 1522   BILIRUBINUR Negative 04/07/2022 1440   BILIRUBINUR Negative 07/19/2013 0635   KETONESUR NEGATIVE 11/30/2022 1522   PROTEINUR NEGATIVE 11/30/2022 1522   UROBILINOGEN 0.2 10/02/2019 1818   NITRITE NEGATIVE 11/30/2022 1522   LEUKOCYTESUR NEGATIVE 11/30/2022 1522   LEUKOCYTESUR Negative 07/19/2013 0635  I have reviewed the labs.   Pertinent Imaging: N/A    Assessment & Plan:  ***  1. Hypogonadism -testosterone level therapeutic -HCT normal -continue testosterone cypionate 200 mg/mL, 1 cc every 14 days  2. BPH with LU TS -PSA stable -continue finasteride 5 mg daily  3. ED -continue ***   No follow-ups on file.  These notes generated with voice recognition software. I apologize for typographical errors.  Cloretta Ned  [facility] [address]  [address] [place], [place] [address] [phone]

## 2023-03-15 ENCOUNTER — Ambulatory Visit: Payer: Medicare HMO | Admitting: Urology

## 2023-03-15 DIAGNOSIS — E291 Testicular hypofunction: Secondary | ICD-10-CM

## 2023-03-15 DIAGNOSIS — N401 Enlarged prostate with lower urinary tract symptoms: Secondary | ICD-10-CM

## 2023-03-15 DIAGNOSIS — N5201 Erectile dysfunction due to arterial insufficiency: Secondary | ICD-10-CM

## 2023-03-16 ENCOUNTER — Telehealth: Payer: Self-pay | Admitting: Nurse Practitioner

## 2023-03-16 NOTE — Telephone Encounter (Signed)
Copied from CRM [id number]. Topic: Medicare AWV >> Mar 16, 2023  2:54 PM Payton Doughty wrote: Reason for CRM: Called patient to schedule Medicare Annual Wellness Visit (AWV). Left message for patient to call back and schedule Medicare Annual Wellness Visit (AWV).  Last date of AWV: 10/27/2021   Please schedule an appointment at any time with Kennedy Bucker, LPN  .  If any questions, please contact me.  Thank you ,  Verlee Rossetti; Care Guide Ambulatory Clinical Support [facility] Direct Dial: [phone]

## 2023-03-23 NOTE — Progress Notes (Signed)
  Care Coordination Note  03/23/2023 Name: Russell Simpson MRN: [id number] DOB: 18-Mar-1961  Russell Simpson is a 62 y.o. year old male who is a primary care patient of [name], NP and is actively engaged with the care management team. I reached out to [name] by phone today to assist with re-scheduling a follow up visit with the RN Case Manager  Follow up plan: Telephone appointment with care management team member scheduled for: 03/31/2023  Burman Nieves, [facility] Care Coordination Care Guide Direct Dial: [phone]

## 2023-03-24 DIAGNOSIS — M25671 Stiffness of right ankle, not elsewhere classified: Secondary | ICD-10-CM | POA: Insufficient documentation

## 2023-03-24 DIAGNOSIS — M25571 Pain in right ankle and joints of right foot: Secondary | ICD-10-CM | POA: Diagnosis not present

## 2023-03-24 DIAGNOSIS — F1010 Alcohol abuse, uncomplicated: Secondary | ICD-10-CM | POA: Diagnosis not present

## 2023-03-24 DIAGNOSIS — F3162 Bipolar disorder, current episode mixed, moderate: Secondary | ICD-10-CM | POA: Diagnosis not present

## 2023-03-24 DIAGNOSIS — F410 Panic disorder [episodic paroxysmal anxiety] without agoraphobia: Secondary | ICD-10-CM | POA: Diagnosis not present

## 2023-03-25 NOTE — Progress Notes (Signed)
03/26/2023 12:00 PM   Russell Simpson 1961/11/15 [id number]  Referring provider: [name], NP [address] [place],  [place] [address]  Urological history: 1. Hypogonadotrophic hypogonadism -Contributing factors of a prolactin secreting pituitary tumor, alcohol consumption, sleep apnea and age -Testosterone (03/2023) 390 -Hematocrit (03/2023) 45.7 -testosterone cypionate 200 mg/milliliters, 1 cc every 14 days   2.  Erectile dysfunction -Contributing factors of hypogonadism, BPH, smoking, sleep apnea, depression, hypertension and COPD and alcohol consumption -failed PDE5i's   3. BPH with LU TS -PSA (03/2023) 2.3 -finasteride 5 mg daily    Chief Complaint  Patient presents with   Benign Prostatic Hypertrophy   Erectile Dysfunction    HPI: Russell Simpson is a 62 y.o. male who presents today for follow up.    SHIM 5  He states he has not had an erection in 2 years.  Patient is not having spontaneous erections.  He denies any pain or curvature with erections.   Failed PDE5i's and failed ICI.     SHIM     Row Name 03/26/23 1124         SHIM: Over the last 6 months:   How do you rate your confidence that you could get and keep an erection? Very Low     When you had erections with sexual stimulation, how often were your erections hard enough for penetration (entering your partner)? Almost Never or Never     During sexual intercourse, how often were you able to maintain your erection after you had penetrated (entered) your partner? Almost Never or Never     During sexual intercourse, how difficult was it to maintain your erection to completion of intercourse? Extremely Difficult     When you attempted sexual intercourse, how often was it satisfactory for you? Almost Never or Never       SHIM Total Score   SHIM 5              Score: 1-7 Severe ED 8-11 Moderate ED 12-16 Mild-Moderate ED 17-21 Mild ED 22-25 No ED   I PSS 3/2.  Patient denies any  modifying or aggravating factors.  Patient denies any gross hematuria, dysuria or suprapubic/flank pain.  Patient denies any fevers, chills, nausea or vomiting.     IPSS     Row Name 03/26/23 1100         International Prostate Symptom Score   How often have you had the sensation of not emptying your bladder? Not at All     How often have you had to urinate less than every two hours? Not at All     How often have you found you stopped and started again several times when you urinated? Not at All     How often have you found it difficult to postpone urination? Not at All     How often have you had a weak urinary stream? Not at All     How often have you had to strain to start urination? Not at All     How many times did you typically get up at night to urinate? 3 Times     Total IPSS Score 3       Quality of Life due to urinary symptoms   If you were to spend the rest of your life with your urinary condition just the way it is now how would you feel about that? Mostly Satisfied  Score:  1-7 Mild 8-19 Moderate 20-35 Severe    PMH: 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HCC)    Arthritis    Asthma    Basal cell carcinoma 01/28/2021   R upper arm, EDC 03/04/2021   Basal cell carcinoma 12/31/2022   Right temporal hair line. Nodular. 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   left  upper back ED&C done   Hypertension    Hyponatremia    Inappropriate sinus tachycardia    Pneumonia    SCC (squamous cell carcinoma) 08/28/2021   upper chest right of midline, EDC done 09/17/2021   SCC (squamous cell carcinoma) 09/30/2022   left chest  ED&C done   Sleep apnea    does not wear CPAP ; uses humidifier instead   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   right upper back, ED&C 06/18/2022   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   right upper back ED&C done   Squamous cell carcinoma of skin 02/19/2021   L inferior mandible, treated with EDC   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/28/2021   R ant shoulder - ED&C   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/28/2021   L upper abdomen - ED&C    Surgical History: Past Surgical History:  Procedure Laterality Date   ANTERIOR CERVICAL DECOMP/DISCECTOMY FUSION N/A 06/17/2016   Procedure: ANTERIOR CERVICAL DECOMPRESSION FUSION, CERVICAL 3-4, CERVICAL 4-5 WITH INSTRUMENTATION AND ALLOGRAFT;  Surgeon: Estill Bamberg, MD;  Location: [facility];  Service: Orthopedics;  Laterality: N/A;  ANTERIOR CERVICAL DECOMPRESSION FUSION, CERVICAL 3-4, CERVICAL 4-5 WITH INSTRUMENTATION AND ALLOGRAFT   BACK SURGERY     x3   NECK SURGERY  12/01/2007   ORIF ANKLE FRACTURE Right 12/02/2022   Procedure: OPEN REDUCTION INTERNAL FIXATION (ORIF) ANKLE FRACTURE;  Surgeon: Juanell Fairly, MD;  Location: [facility];  Service: Orthopedics;  Laterality: Right;   ORIF ANKLE FRACTURE Right 12/01/2022   Procedure: OPEN REDUCTION INTERNAL FIXATION (ORIF) ANKLE FRACTURE;  Surgeon: Juanell Fairly, MD;  Location: [facility];  Service: Orthopedics;  Laterality: Right;   PORTA CATH INSERTION N/A 04/03/2021   Procedure: PORTA CATH INSERTION;  Surgeon: Annice Needy, MD;  Location: [facility];  Service: Cardiovascular;  Laterality: N/A;   VIDEO BRONCHOSCOPY WITH ENDOBRONCHIAL ULTRASOUND Bilateral 10/14/2022   Procedure: VIDEO BRONCHOSCOPY WITH ENDOBRONCHIAL  ULTRASOUND;  Surgeon: Salena Saner, MD;  Location: [facility];  Service: Pulmonary;  Laterality: Bilateral;    Home Medications:  Allergies as of 03/26/2023       Reactions   Penicillins Anaphylaxis   Tolerates cefdinir, cephalexin and amoxicillin/clavulonate so true PCN allergy unlikely   Tetanus Toxoids Swelling   Lisinopril Cough   Losartan    Other reaction(s): Muscle Pain   Codeine Nausea Only, Nausea And Vomiting   Doxycycline Rash  Ruxience [rituximab-pvvr] Rash   05/06/21 pt w/ worsening rash during Ruxience infusion.  Face, neck and chest flushing redness        Medication List        Accurate as of March 26, 2023 12:00 PM. If you have any questions, ask your nurse or doctor.          STOP taking these medications    amLODipine 2.5 MG tablet Commonly known as: NORVASC Stopped by: Henreitta Leber, MD   sildenafil 20 MG tablet Commonly known as: REVATIO Stopped by: Michiel Cowboy, PA-C       TAKE these medications    albuterol 108 (90 Base) MCG/ACT inhaler Commonly known as: VENTOLIN HFA Inhale 2 puffs into the lungs every 6 (six) hours as needed for wheezing or shortness of breath.   atorvastatin 80 MG tablet Commonly known as: LIPITOR Take 1 tablet (80 mg total) by mouth daily. What changed: when to take this   buPROPion 150 MG 12 hr tablet Commonly known as: WELLBUTRIN SR Take 150 mg by mouth 2 (two) times daily.   calcipotriene 0.005 % cream Commonly known as: DOVONOX Apply topically 2 (two) times daily. Apply twice a day after fluorouracil to affected areas as directed in handout.   cetirizine 10 MG tablet Commonly known as: ZYRTEC Take 1 tablet (10 mg total) by mouth daily.   clonazePAM 0.5 MG tablet Commonly known as: KLONOPIN Take 0.5 mg by mouth every evening.   DULoxetine 60 MG capsule Commonly known as: CYMBALTA Take 60 mg by mouth every evening.   famotidine 20 MG tablet Commonly known as: PEPCID Take 20 mg by mouth 2  (two) times daily.   finasteride 5 MG tablet Commonly known as: PROSCAR Take 1 tablet (5 mg total) by mouth every evening.   fluticasone 50 MCG/ACT nasal spray Commonly known as: FLONASE Place 2 sprays into both nostrils daily.   metoprolol succinate 100 MG 24 hr tablet Commonly known as: TOPROL-XL Take 100 mg by mouth daily.   mirtazapine 15 MG tablet Commonly known as: REMERON Take 15 mg by mouth at bedtime.   montelukast 10 MG tablet Commonly known as: SINGULAIR Take 10 mg by mouth daily.   mupirocin ointment 2 % Commonly known as: BACTROBAN Apply 1 Application topically daily. Apply to any open wounds until healed.   naltrexone 50 MG tablet Commonly known as: DEPADE Take 50 mg by mouth daily.   nicotine 21 mg/24hr patch Commonly known as: NICODERM CQ - dosed in mg/24 hours Place 1 patch (21 mg total) onto the skin daily.   OLANZapine 5 MG tablet Commonly known as: ZYPREXA Take 5 mg by mouth at bedtime.   ONE TOUCH ULTRA 2 w/Device Kit 1 each by Does not apply route daily.   OneTouch Ultra test strip Generic drug: glucose blood 1 each by Other route daily. Use as instructed   onetouch ultrasoft lancets 1 each by Other route daily. Use as instructed   sildenafil 100 MG tablet Commonly known as: VIAGRA Take 1 tablet (100 mg total) by mouth daily as needed for erectile dysfunction. Take two hours prior to intercourse on an empty stomach Started by: Michiel Cowboy, PA-C   tenofovir 300 MG tablet Commonly known as: VIREAD TAKE 1 TABLET BY MOUTH ONCE DAILY   testosterone cypionate 200 MG/ML injection Commonly known as: DEPOTESTOSTERONE CYPIONATE Inject 1 mL (200 mg total) into the muscle every 14 (fourteen) days.   traMADol 50 MG tablet Commonly known as: Janean Sark  Take 50 mg by mouth every 6 (six) hours as needed.   Trelegy Ellipta 200-62.5-25 MCG/ACT Aepb Generic drug: Fluticasone-Umeclidin-Vilant Inhale 1 puff into the lungs daily.         Allergies:  Allergies  Allergen Reactions   Penicillins Anaphylaxis    Tolerates cefdinir, cephalexin and amoxicillin/clavulonate so true PCN allergy unlikely   Tetanus Toxoids Swelling   Lisinopril Cough   Losartan      Other reaction(s): Muscle Pain     Codeine Nausea Only and Nausea And Vomiting   Doxycycline Rash   Ruxience [Rituximab-Pvvr] Rash    05/06/21 pt w/ worsening rash during Ruxience infusion.  Face, neck and chest flushing redness    Family History: Family History  Problem Relation Age of Onset   Diabetes Mother    Heart attack Father    Emphysema Father    Diabetes Other     Social History:  reports that he quit smoking about 3 months ago. His smoking use included cigars. He has never used smokeless tobacco. He reports current alcohol use. He reports that he does not use drugs.  ROS: Pertinent ROS in HPI  Physical Exam: BP 108/72   Pulse 98   Ht 6\' 2"  (1.88 m)   Wt 246 lb (111.6 kg)   BMI 31.58 kg/m   Constitutional:  Well nourished. Alert and oriented, No acute distress. HEENT: [place] AT, moist mucus membranes.  Trachea midline, no masses. Cardiovascular: No clubbing, cyanosis, or edema. Respiratory: Normal respiratory effort, no increased work of breathing. GI: Abdomen is soft, non tender, non distended, no abdominal masses. Liver and spleen not palpable.  No hernias appreciated.  Stool sample for occult testing is not indicated.   GU: No CVA tenderness.  No bladder fullness or masses.  Patient with circumcised phallus.  Urethral meatus is patent.  No penile discharge. No penile lesions or rashes. Scrotum without lesions, cysts, rashes and/or edema.  Testicles are located scrotally bilaterally. No masses are appreciated in the testicles. Left and right epididymis are normal. Rectal: Patient with  normal sphincter tone. Anus and perineum without scarring or rashes. No rectal masses are appreciated. Prostate is approximately 50 grams, could palpate the  apex and midportion of the gland, no nodules are appreciated. Seminal vesicles could not be palpated. Skin: No rashes, bruises or suspicious lesions. Lymph: No cervical or inguinal adenopathy. Neurologic: Grossly intact, no focal deficits, moving all 4 extremities. Psychiatric: Normal mood and affect.  Laboratory Data: Lab Results  Component Value Date   WBC 6.1 01/01/2023   HGB 14.1 01/01/2023   HCT 45.7 03/02/2023   MCV 90.8 01/01/2023   PLT 144 (L) 01/01/2023    Lab Results  Component Value Date   CREATININE 0.86 01/01/2023     Lab Results  Component Value Date   TESTOSTERONE 390 03/02/2023    Lab Results  Component Value Date   HGBA1C 6.2 (H) 12/30/2022        Component Value Date/Time   CHOL 164 06/23/2022 1327   CHOL 142 04/07/2022 1442   HDL 43 06/23/2022 1327   HDL 35 (L) 04/07/2022 1442   CHOLHDL 3.8 06/23/2022 1327   VLDL 19 06/23/2022 1327   LDLCALC 102 (H) 06/23/2022 1327   LDLCALC 88 04/07/2022 1442   LDLCALC 108 (H) 10/15/2017 0906    Lab Results  Component Value Date   AST 30 01/01/2023   Lab Results  Component Value Date   ALT 28 01/01/2023    Urinalysis  Component Value Date/Time   COLORURINE STRAW (A) 11/30/2022 1522   APPEARANCEUR CLEAR (A) 11/30/2022 1522   APPEARANCEUR Clear 04/07/2022 1440   LABSPEC 1.005 11/30/2022 1522   LABSPEC 1.017 07/19/2013 0635   PHURINE 5.0 11/30/2022 1522   GLUCOSEU NEGATIVE 11/30/2022 1522   GLUCOSEU Negative 07/19/2013 0635   HGBUR NEGATIVE 11/30/2022 1522   BILIRUBINUR NEGATIVE 11/30/2022 1522   BILIRUBINUR Negative 04/07/2022 1440   BILIRUBINUR Negative 07/19/2013 0635   KETONESUR NEGATIVE 11/30/2022 1522   PROTEINUR NEGATIVE 11/30/2022 1522   UROBILINOGEN 0.2 10/02/2019 1818   NITRITE NEGATIVE 11/30/2022 1522   LEUKOCYTESUR NEGATIVE 11/30/2022 1522   LEUKOCYTESUR Negative 07/19/2013 0635  I have reviewed the labs.   Pertinent Imaging: N/A    Assessment & Plan:    1.  Hypogonadism -testosterone level therapeutic -HCT normal -continue testosterone cypionate 200 mg/mL, 1 cc every 14 days -Return to clinic in 3 months for testosterone, hemoglobin hematocrit  2. BPH with LU TS -PSA stable -continue finasteride 5 mg daily  3. ED -Since he has not been responding to the sildenafil 20 mg on-demand dosing and he did not respond to ICI, I discussed referral to a high-volume implanter for penile prosthesis, he deferred -He would like to try sildenafil 100 mg on-demand dosing   Return in about 3 months (around 06/25/2023) for testosterone (one week after injection) H & H.  These notes generated with voice recognition software. I apologize for typographical errors.  Cloretta Ned  [facility] [address]  [address] [place], [place] [address] [phone]

## 2023-03-26 ENCOUNTER — Ambulatory Visit (INDEPENDENT_AMBULATORY_CARE_PROVIDER_SITE_OTHER): Payer: Medicare HMO | Admitting: Urology

## 2023-03-26 ENCOUNTER — Inpatient Hospital Stay: Payer: Medicare HMO | Attending: Oncology | Admitting: Internal Medicine

## 2023-03-26 ENCOUNTER — Encounter: Payer: Self-pay | Admitting: Internal Medicine

## 2023-03-26 ENCOUNTER — Other Ambulatory Visit: Payer: Self-pay | Admitting: Unknown Physician Specialty

## 2023-03-26 ENCOUNTER — Encounter: Payer: Self-pay | Admitting: Urology

## 2023-03-26 VITALS — BP 108/72 | HR 98 | Ht 74.0 in | Wt 246.0 lb

## 2023-03-26 VITALS — BP 131/86 | HR 89 | Temp 97.2°F | Resp 16 | Wt 246.0 lb

## 2023-03-26 DIAGNOSIS — N401 Enlarged prostate with lower urinary tract symptoms: Secondary | ICD-10-CM

## 2023-03-26 DIAGNOSIS — N5201 Erectile dysfunction due to arterial insufficiency: Secondary | ICD-10-CM | POA: Diagnosis not present

## 2023-03-26 DIAGNOSIS — R55 Syncope and collapse: Secondary | ICD-10-CM | POA: Diagnosis not present

## 2023-03-26 DIAGNOSIS — R059 Cough, unspecified: Secondary | ICD-10-CM | POA: Diagnosis not present

## 2023-03-26 DIAGNOSIS — E291 Testicular hypofunction: Secondary | ICD-10-CM | POA: Diagnosis not present

## 2023-03-26 DIAGNOSIS — Z8572 Personal history of non-Hodgkin lymphomas: Secondary | ICD-10-CM | POA: Insufficient documentation

## 2023-03-26 MED ORDER — TESTOSTERONE CYPIONATE 200 MG/ML IM SOLN: 200.0000 mg | INTRAMUSCULAR | 0 refills | Status: DC

## 2023-03-26 MED ORDER — SILDENAFIL CITRATE 100 MG PO TABS
100.0000 mg | ORAL_TABLET | Freq: Every day | ORAL | 3 refills | Status: DC | PRN
Start: 2023-03-26 — End: 2024-05-25

## 2023-03-26 NOTE — Progress Notes (Signed)
[facility] [address]  [place], [place] [address] [phone]   New Patient Evaluation  Date of Service: 03/26/23 Patient Name: Russell Simpson Patient MRN: [id number] Patient DOB: [birth year] Provider: Henreitta Leber, Simpson  Identifying Statement:  Russell Simpson is a 62 y.o. male with Brief loss of consciousness who presents for initial consultation and evaluation regarding cancer associated neurologic deficits.    Referring Provider: Creig Hines, Simpson 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  Hines Kloss presents to review episodes of loss of consciousness.  He describes ~5 episodes over the past '3-4 years': lightheaded or lack of prodrome, brief jerking of "whole body", then "passing out for a few seconds" before waking up.  There is no post event confusion or drowsiness, downtime is always seconds.  No other neurologic symptoms.  Most recent event on Jan 1st led to a fall and broken leg.  No history of frank seizures, following with Dr. Smith Praneeth for lymphoma, surveillance only at this time.  Medications: Current Outpatient Medications on File Prior to Visit  Medication Sig Dispense Refill    albuterol (VENTOLIN HFA) 108 (90 Base) MCG/ACT inhaler Inhale 2 puffs into the lungs every 6 (six) hours as needed for wheezing or shortness of breath. 18 g 6   atorvastatin (LIPITOR) 80 MG tablet Take 1 tablet (80 mg total) by mouth daily. (Patient taking differently: Take 80 mg by mouth every evening.) 90 tablet 3   Blood Glucose Monitoring Suppl (ONE TOUCH ULTRA 2) w/Device KIT 1 each by Does not apply route daily. 1 kit 0   buPROPion (WELLBUTRIN SR) 150 MG 12 hr tablet Take 150 mg by mouth 2 (two) times daily.     calcipotriene (DOVONOX) 0.005 % cream Apply topically 2 (two) times daily. Apply twice a day after fluorouracil to affected areas as directed in handout. 60 g 1   cetirizine (ZYRTEC) 10 MG tablet Take 1 tablet (10 mg total) by mouth daily. 30 tablet 11   clonazePAM (KLONOPIN) 0.5 MG tablet Take 0.5 mg by mouth every evening.     DULoxetine (CYMBALTA) 60 MG capsule Take 60 mg by mouth every evening.     famotidine (PEPCID) 20 MG tablet Take 20 mg by mouth 2 (two) times daily.     finasteride (PROSCAR) 5 MG tablet Take 1 tablet (5 mg total) by mouth every evening. 90 tablet 0   fluticasone (FLONASE) 50 MCG/ACT nasal spray Place 2 sprays into both nostrils daily. 16 g 6   Fluticasone-Umeclidin-Vilant (TRELEGY ELLIPTA) 200-62.5-25 MCG/ACT AEPB Inhale 1 puff into the lungs daily. 14 each 0   glucose blood (ONETOUCH ULTRA) test strip 1 each by Other route daily. Use as instructed 100 each 3   Lancets (  ONETOUCH ULTRASOFT) lancets 1 each by Other route daily. Use as instructed 100 each 3   metoprolol succinate (TOPROL-XL) 100 MG 24 hr tablet Take 100 mg by mouth daily.     mirtazapine (REMERON) 15 MG tablet Take 15 mg by mouth at bedtime.     montelukast (SINGULAIR) 10 MG tablet Take 10 mg by mouth daily.     mupirocin ointment (BACTROBAN) 2 % Apply 1 Application topically daily. Apply to any open wounds until healed. 22 g 3   OLANZapine (ZYPREXA) 5 MG tablet Take 5 mg by mouth at bedtime.      sildenafil (REVATIO) 20 MG tablet TAKE 3-5 TABLETS BY MOUTH ONCE DAILY AS NEEDED FOR ERECTILE DYSFUNCTION 30 tablet 0   tenofovir (VIREAD) 300 MG tablet TAKE 1 TABLET BY MOUTH ONCE DAILY 90 tablet 3   testosterone cypionate (DEPOTESTOSTERONE CYPIONATE) 200 MG/ML injection Inject 1 mL (200 mg total) into the muscle every 14 (fourteen) days. 4 mL 0   traMADol (ULTRAM) 50 MG tablet Take 50 mg by mouth every 6 (six) hours as needed.     naltrexone (DEPADE) 50 MG tablet Take 50 mg by mouth daily. (Patient not taking: Reported on 03/26/2023)     nicotine (NICODERM CQ - DOSED IN MG/24 HOURS) 21 mg/24hr patch Place 1 patch (21 mg total) onto the skin daily. (Patient not taking: Reported on 03/26/2023) 28 patch 0   Current Facility-Administered Medications on File Prior to Visit  Medication Dose Route Frequency Provider Last Rate Last Admin   heparin lock flush 100 unit/mL  500 Units Intravenous Once Russell Simpson       heparin lock flush 100 unit/mL  500 Units Intravenous Once Russell Simpson       sodium chloride flush (NS) 0.9 % injection 10 mL  10 mL Intravenous PRN Russell Simpson        Allergies:  Allergies  Allergen Reactions   Penicillins Anaphylaxis    Tolerates cefdinir, cephalexin and amoxicillin/clavulonate so true PCN allergy unlikely   Tetanus Toxoids Swelling   Lisinopril Cough   Losartan      Other reaction(s): Muscle Pain     Codeine Nausea Only and Nausea And Vomiting   Doxycycline Rash   Ruxience [Rituximab-Pvvr] Rash    05/06/21 pt w/ worsening rash during Ruxience infusion.  Face, neck and chest flushing redness   Past Medical History:  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HCC)    Arthritis    Asthma    Basal cell carcinoma 01/28/2021   R upper arm, EDC 03/04/2021   Basal cell carcinoma 12/31/2022   Right temporal hair line. Nodular. 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   left upper back ED&C done   Hypertension    Hyponatremia  Inappropriate sinus tachycardia    Pneumonia    SCC (squamous cell carcinoma) 08/28/2021   upper chest right of midline, EDC done 09/17/2021   SCC (squamous cell carcinoma) 09/30/2022   left chest  ED&C done   Sleep apnea    does not wear CPAP ; uses humidifier instead   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   right upper back, [facility] 06/18/2022   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   right upper back ED&C done   Squamous cell carcinoma of skin 02/19/2021   L inferior mandible, treated with EDC   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/28/2021   R ant shoulder - ED&C   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/28/2021   L upper abdomen - ED&C   Past Surgical History:  Past Surgical History:  Procedure Laterality Date   ANTERIOR CERVICAL DECOMP/DISCECTOMY FUSION N/A 06/17/2016   Procedure: ANTERIOR CERVICAL DECOMPRESSION FUSION, CERVICAL 3-4, CERVICAL 4-5 WITH INSTRUMENTATION AND ALLOGRAFT;  Surgeon: Estill Bamberg, Simpson;  Location: [facility] OR;  Service: Orthopedics;  Laterality: N/A;  ANTERIOR CERVICAL DECOMPRESSION FUSION, CERVICAL 3-4, CERVICAL 4-5 WITH INSTRUMENTATION AND ALLOGRAFT   BACK SURGERY     x3   NECK SURGERY  12/01/2007   ORIF ANKLE FRACTURE Right 12/02/2022   Procedure: OPEN REDUCTION INTERNAL FIXATION (ORIF) ANKLE FRACTURE;  Surgeon: Juanell Fairly, Simpson;  Location: [facility] ORS;  Service: Orthopedics;   Laterality: Right;   ORIF ANKLE FRACTURE Right 12/01/2022   Procedure: OPEN REDUCTION INTERNAL FIXATION (ORIF) ANKLE FRACTURE;  Surgeon: Juanell Fairly, Simpson;  Location: [facility] ORS;  Service: Orthopedics;  Laterality: Right;   PORTA CATH INSERTION N/A 04/03/2021   Procedure: PORTA CATH INSERTION;  Surgeon: Annice Needy, Simpson;  Location: [facility] INVASIVE CV LAB;  Service: Cardiovascular;  Laterality: N/A;   VIDEO BRONCHOSCOPY WITH ENDOBRONCHIAL ULTRASOUND Bilateral 10/14/2022   Procedure: VIDEO BRONCHOSCOPY WITH ENDOBRONCHIAL ULTRASOUND;  Surgeon: Salena Saner, Simpson;  Location: [facility] ORS;  Service: Pulmonary;  Laterality: Bilateral;   Social History:  Social History   Socioeconomic History   Marital status: Divorced    Spouse name: Not on file   Number of children: Not on file   Years of education: Not on file   Highest education level: Not on file  Occupational History   Not on file  Tobacco Use   Smoking status: Former    Types: Cigars    Quit date: 11/30/2022    Years since quitting: 0.3   Smokeless tobacco: Never  Vaping Use   Vaping Use: Former   Start date: 04/30/2017   Quit date: 11/30/2017  Substance and Sexual Activity   Alcohol use: Yes    Comment: beers 6 a day   Drug use: No   Sexual activity: Not Currently  Other Topics Concern   Not on file  Social History Narrative   Not on file   Social Determinants of Health   Financial Resource Strain: High Risk (01/01/2023)   Overall Financial Resource Strain (CARDIA)    Difficulty of Paying Living Expenses: Hard  Food Insecurity: No Food Insecurity (12/07/2022)   Hunger Vital Sign    Worried About Running Out of Food in the Last Year: Never true    Ran Out of Food in the Last Year: Never true  Transportation Needs: No Transportation Needs (12/07/2022)   PRAPARE - Administrator, Civil Service (Medical): No    Lack of Transportation (Non-Medical): No  Physical Activity: Inactive (01/01/2023)   Exercise Vital Sign  Days  of Exercise per Week: 0 days    Minutes of Exercise per Session: 0 min  Stress: No Stress Concern Present (10/27/2021)   [name] of Occupational Health - Occupational Stress Questionnaire    Feeling of Stress : Only a little  Social Connections: Socially Isolated (01/01/2023)   Social Connection and Isolation Panel [NHANES]    Frequency of Communication with Friends and Family: Three times a week    Frequency of Social Gatherings with Friends and Family: Three times a week    Attends Religious Services: Never    Active Member of Clubs or Organizations: No    Attends Banker Meetings: Not on file    Marital Status: Divorced  Intimate Partner Violence: Not At Risk (12/01/2022)   Humiliation, Afraid, Rape, and Kick questionnaire    Fear of Current or Ex-Partner: No    Emotionally Abused: No    Physically Abused: No    Sexually Abused: No   Family History:  Family History  Problem Relation Age of Onset   Diabetes Mother    Heart attack Father    Emphysema Father    Diabetes Other     Review of Systems: Constitutional: Doesn't report fevers, chills or abnormal weight loss Eyes: Doesn't report blurriness of vision Ears, nose, mouth, throat, and face: Doesn't report sore throat Respiratory: Doesn't report cough, dyspnea or wheezes Cardiovascular: Doesn't report palpitation, chest discomfort  Gastrointestinal:  Doesn't report nausea, constipation, diarrhea GU: Doesn't report incontinence Skin: Doesn't report skin rashes Neurological: Per HPI Musculoskeletal: Doesn't report joint pain Behavioral/Psych: Doesn't report anxiety  Physical Exam: Vitals:   03/26/23 1004  BP: 131/86  Pulse: 89  Resp: 16  Temp: (!) 97.2 F (36.2 C)   KPS: 90. General: Alert, cooperative, pleasant, in no acute distress Head: Normal EENT: No conjunctival injection or scleral icterus.  Lungs: Resp effort normal Cardiac: Regular rate Abdomen: Non-distended abdomen Skin: No  rashes cyanosis or petechiae. Extremities: No clubbing or edema  Neurologic Exam: Mental Status: Awake, alert, attentive to examiner. Oriented to self and environment. Language is fluent with intact comprehension.  Cranial Nerves: Visual acuity is grossly normal. Visual fields are full. Extra-ocular movements intact. No ptosis. Face is symmetric Motor: Tone and bulk are normal. Power is full in both arms and legs. Reflexes are symmetric, no pathologic reflexes present.  Sensory: Intact to light touch Gait: Normal.   Labs: I have reviewed the data as listed    Component Value Date/Time   NA 136 01/01/2023 1122   NA 139 12/30/2022 1335   NA 139 07/19/2013 0548   K 4.6 01/01/2023 1122   K 3.7 07/19/2013 0548   CL 102 01/01/2023 1122   CL 105 07/19/2013 0548   CO2 22 01/01/2023 1122   CO2 24 07/19/2013 0548   GLUCOSE 126 (H) 01/01/2023 1122   GLUCOSE 98 07/19/2013 0548   BUN 7 01/01/2023 1122   BUN 6 (L) 12/30/2022 1335   BUN 12 07/19/2013 0548   CREATININE 0.86 01/01/2023 1122   CREATININE 1.14 10/15/2017 0906   CALCIUM 8.8 (L) 01/01/2023 1122   CALCIUM 8.8 07/19/2013 0548   PROT 6.2 (L) 01/01/2023 1122   PROT 5.7 (L) 12/30/2022 1335   PROT 7.4 07/19/2013 0548   ALBUMIN 3.8 01/01/2023 1122   ALBUMIN 4.2 12/30/2022 1335   ALBUMIN 4.2 07/19/2013 0548   AST 30 01/01/2023 1122   AST 40 (H) 07/19/2013 0548   ALT 28 01/01/2023 1122   ALT 39 07/19/2013  0548   ALKPHOS 164 (H) 01/01/2023 1122   ALKPHOS 87 07/19/2013 0548   BILITOT 0.5 01/01/2023 1122   BILITOT 0.3 12/30/2022 1335   BILITOT 0.3 07/19/2013 0548   GFRNONAA >60 01/01/2023 1122   GFRNONAA 72 10/15/2017 0906   GFRAA >60 07/25/2020 0937   GFRAA 83 10/15/2017 0906   Lab Results  Component Value Date   WBC 6.1 01/01/2023   NEUTROABS 3.0 01/01/2023   HGB 14.1 01/01/2023   HCT 45.7 03/02/2023   MCV 90.8 01/01/2023   PLT 144 (L) 01/01/2023    Imaging: CLINICAL DATA:  Provided history:  Syncope/presyncope, cerebrovascular cause suspected. Additional history provided: Shortness of breath, sore throat, cough, nasal congestion, body aches, possible RSV.   EXAM: CT HEAD WITHOUT CONTRAST   TECHNIQUE: Contiguous axial images were obtained from the base of the skull through the vertex without intravenous contrast.   RADIATION DOSE REDUCTION: This exam was performed according to the departmental dose-optimization program which includes automated exposure control, adjustment of the mA and/or kV according to patient size and/or use of iterative reconstruction technique.   COMPARISON:  CT 11/21/2019.   FINDINGS: Brain:   Moderate generalized cerebral atrophy.   Mild-to-moderate patchy and ill-defined hypoattenuation within the cerebral white matter, nonspecific but compatible with chronic small vessel ischemic disease.   There is no acute intracranial hemorrhage.   No demarcated cortical infarct.   No extra-axial fluid collection.   No evidence of an intracranial mass.   No midline shift.   Vascular: No hyperdense vessel.   Skull: Chronic fracture deformity of the left frontal calvarium (with involvement of the outer table of the left frontal sinus). No acute calvarial fracture or aggressive osseous lesion.   Sinuses/Orbits: No mass or acute finding within the imaged orbits. Chronic medially displaced fracture deformity of the left lamina papyracea. Mild-to-moderate mucosal thickening within the bilateral maxillary sinuses. Mild mucosal thickening within the right sphenoid sinus. Mild-to-moderate mucosal thickening within the left sphenoid sinus. Mild mucosal thickening within the bilateral ethmoid air cells.   IMPRESSION: No evidence of acute intracranial abnormality.   Mild-to-moderate chronic small vessel ischemic changes within the cerebral white matter.   Chronic fracture deformities of the left frontal calvarium and medial left orbit.    Paranasal sinus disease, as described.     Electronically Signed   By: Jackey Loge D.O.   On: 11/02/2022 10:07  Assessment/Plan Brief loss of consciousness  Domanik Rainville presents with clinical syndrome most consistent with cardiovascular syncope rather than seizure.  Given common association with coughing fits, there is likely a vasovagal component.  This may be exacerbated by his dosing beta blocker, metoprolol.   We did not recommend any further neurologic workup at this time.  Cardiac workup, including telemetry, echo, were completed in the past year.  He should continue to follow with PCP and cardiology for ongoing management.  We spent twenty additional minutes teaching regarding the natural history, biology, and historical experience in the treatment of neurologic complications of cancer.   We appreciate the opportunity to participate in the care of Dwayn Moravek.   All questions were answered. The patient knows to call the clinic with any problems, questions or concerns. No barriers to learning were detected.  The total time spent in the encounter was 40 minutes and more than 50% was on counseling and review of test results   Henreitta Leber, Simpson Medical Director of Neuro-Oncology [facility] 03/26/23 10:50 AM

## 2023-03-26 NOTE — Progress Notes (Signed)
Pt referred by Dr Smith Arles for Syncope, pt reports "no black out spells since Nov 30, 2022.

## 2023-03-26 NOTE — Telephone Encounter (Signed)
Requested medication (s) are due for refill today - unknown  Requested medication (s) are on the active medication list -yes  Future visit scheduled -yes  Last refill: 11/30/22  Notes to clinic: listed as historical provider- sent for review   Requested Prescriptions  Pending Prescriptions Disp Refills   metoprolol succinate (TOPROL-XL) 100 MG 24 hr tablet [Pharmacy Med Name: METOPROLOL SUCCINATE ER 100 MG TAB] 90 tablet     Sig: TAKE 1 TABLET BY MOUTH ONCE DAILY WITH OR IMMEDIATELY FOLLOWING A MEAL     Cardiovascular:  Beta Blockers Passed - 03/26/2023  1:16 PM      Passed - Last BP in normal range    BP Readings from Last 1 Encounters:  03/26/23 108/72         Passed - Last Heart Rate in normal range    Pulse Readings from Last 1 Encounters:  03/26/23 98         Passed - Valid encounter within last 6 months    Recent Outpatient Visits           2 weeks ago Seasonal allergic rhinitis due to pollen   [facility] [name], NP   1 month ago Brief loss of consciousness   [place] [facility] [name], NP   2 months ago Brief loss of consciousness   [place] [facility] [name], NP   3 months ago Acute maxillary sinusitis, recurrence not specified   [place] [facility] Mecum, Oswaldo Conroy, PA-C   4 months ago Stage 2 moderate COPD by GOLD classification (HCC)   [place] [facility] Mecum, Oswaldo Conroy, PA-C       Future Appointments             In 1 week [place]ale Burly, [place], MD [facility]   In 1 month [place], Clydie Braun, NP [place] [facility]               Requested Prescriptions  Pending Prescriptions Disp Refills   metoprolol succinate (TOPROL-XL) 100 MG 24 hr tablet [Pharmacy Med Name: METOPROLOL SUCCINATE ER 100 MG TAB] 90 tablet     Sig: TAKE 1 TABLET BY MOUTH ONCE DAILY WITH OR IMMEDIATELY FOLLOWING A MEAL      Cardiovascular:  Beta Blockers Passed - 03/26/2023  1:16 PM      Passed - Last BP in normal range    BP Readings from Last 1 Encounters:  03/26/23 108/72         Passed - Last Heart Rate in normal range    Pulse Readings from Last 1 Encounters:  03/26/23 98         Passed - Valid encounter within last 6 months    Recent Outpatient Visits           2 weeks ago Seasonal allergic rhinitis due to pollen   [facility] [name], NP   1 month ago Brief loss of consciousness   [place] [facility] [name], NP   2 months ago Brief loss of consciousness   [place] [facility] [name], NP   3 months ago Acute maxillary sinusitis, recurrence not specified   [facility] Mecum, Erin E, PA-C   4 months ago Stage 2 moderate COPD by GOLD classification [facility])   [place] [facility] Mecum, Oswaldo Conroy, PA-C  Future Appointments             In 1 week [place], [place], MD [facility]   In 1 month [name], NP [facility]

## 2023-03-26 NOTE — Addendum Note (Signed)
Addended by: Consuella Lose on: 03/26/2023 12:06 PM   Modules accepted: Orders

## 2023-03-29 ENCOUNTER — Other Ambulatory Visit: Payer: Self-pay

## 2023-03-29 MED ORDER — FAMOTIDINE 20 MG PO TABS: 20.0000 mg | ORAL_TABLET | Freq: Two times a day (BID) | ORAL | 1 refills | Status: DC

## 2023-03-30 ENCOUNTER — Ambulatory Visit
Admission: RE | Admit: 2023-03-30 | Discharge: 2023-03-30 | Disposition: A | Discharge status: Home / Self Care | Payer: Medicare HMO | Source: Ambulatory Visit | Attending: Oncology | Admitting: Oncology

## 2023-03-30 DIAGNOSIS — C8202 Follicular lymphoma grade I, intrathoracic lymph nodes: Secondary | ICD-10-CM | POA: Diagnosis not present

## 2023-03-30 DIAGNOSIS — C8200 Follicular lymphoma grade I, unspecified site: Secondary | ICD-10-CM | POA: Diagnosis not present

## 2023-03-30 DIAGNOSIS — R918 Other nonspecific abnormal finding of lung field: Secondary | ICD-10-CM | POA: Diagnosis not present

## 2023-03-30 MED ORDER — BARIUM SULFATE 2 % PO SUSP
450.0000 mL | Freq: Two times a day (BID) | ORAL | Status: DC | PRN
  Administered 2023-03-30: 900 mL via ORAL

## 2023-03-30 MED ORDER — IOHEXOL 300 MG/ML  SOLN
100.0000 mL | Freq: Once | INTRAMUSCULAR | Status: AC | PRN
  Administered 2023-03-30: 100 mL via INTRAVENOUS

## 2023-03-31 ENCOUNTER — Ambulatory Visit: Payer: Self-pay | Admitting: *Deleted

## 2023-03-31 NOTE — Patient Outreach (Signed)
  Care Coordination   Follow Up Visit Note   03/31/2023 Name: Russell Simpson MRN: [id number] DOB: 06/12/1961  Russell Simpson is a 62 y.o. year old male who sees [name], NP for primary care. I spoke with  [name] by phone today.  What matters to the patients health and wellness today?  Fully recovery from ankle surgery, complete PT sessions.    Goals Addressed             This Visit's Progress    Attend PT to help with recovrey from ankle surgery   On track    Care Coordination Interventions: Evaluation of current treatment plan related to recent ankle surgery and patient's adherence to plan as established by provider Reviewed scheduled/upcoming provider appointments including dermatology on 5/19, and pulmonary on 6/26 Discussed plans with patient for ongoing care management follow up and provided patient with direct contact information for care management team Does not know how to use blood glucose machine, reminded to take kit to PCP appointment today to be educated on use. Discussed ways to decrease pain in ankle, elevation when not walking helps to relieve pain/discomfort         SDOH assessments and interventions completed:  No     Care Coordination Interventions:  Yes, provided   Interventions Today    Flowsheet Row Most Recent Value  Chronic Disease   Chronic disease during today's visit Other  [ankle surgery recovery]  General Interventions   General Interventions Discussed/Reviewed General Interventions Reviewed, Health Screening, Doctor Visits  Doctor Visits Discussed/Reviewed Doctor Visits Reviewed, Annual Wellness Visits, PCP, Specialist  [AWV on 5/6, Dermatology on 5/9, Cancer center on 5/10, and PCP on 6/4]  Health Screening --  [CT of chest done yesterday, history of cancer, waiting for results]  PCP/Specialist Visits Compliance with follow-up visit  Education Interventions   Education Provided Provided Education  Provided Verbal Education On  Foot Care, Blood Sugar Monitoring, When to see the doctor, Other  [Active with outpatient PT, next session is 5/3, attends weekly. Reports symptoms of neuropathy, advised to discuss with PCP office]        Follow up plan: Follow up call scheduled for 6/6    Encounter Outcome:  Pt. Visit Completed   Kemper Durie, RN, MSN, [facility] Care Management Care Management Coordinator [phone]

## 2023-04-02 DIAGNOSIS — M25571 Pain in right ankle and joints of right foot: Secondary | ICD-10-CM | POA: Diagnosis not present

## 2023-04-02 DIAGNOSIS — M25671 Stiffness of right ankle, not elsewhere classified: Secondary | ICD-10-CM | POA: Diagnosis not present

## 2023-04-05 ENCOUNTER — Ambulatory Visit (INDEPENDENT_AMBULATORY_CARE_PROVIDER_SITE_OTHER): Payer: Medicare HMO

## 2023-04-05 VITALS — Ht 74.0 in | Wt 246.0 lb

## 2023-04-05 DIAGNOSIS — Z0000 Encounter for general adult medical examination without abnormal findings: Secondary | ICD-10-CM

## 2023-04-05 NOTE — Progress Notes (Signed)
I connected with  [name] on 04/05/23 by a audio enabled telemedicine application and verified that I am speaking with the correct person using two identifiers.  Patient Location: Home  Provider Location: Office/Clinic  I discussed the limitations of evaluation and management by telemedicine. The patient expressed understanding and agreed to proceed.  Subjective:   Russell Simpson is a 62 y.o. male who presents for Medicare Annual/Subsequent preventive examination.  Review of Systems      Cardiac Risk Factors include: advanced age (>89men, >28 women);hypertension;male gender;smoking/ tobacco exposure     Objective:    There were no vitals filed for this visit. There is no height or weight on file to calculate BMI.     04/05/2023    3:37 PM 03/26/2023    9:59 AM 01/01/2023   11:48 AM 12/01/2022    6:54 AM 11/30/2022    5:48 AM 11/09/2022    1:33 PM 11/03/2022   11:08 AM  Advanced Directives  Does Patient Have a Medical Advance Directive? Yes Yes Yes Yes Yes Yes Yes  Type of Estate agent of [place];Living will Healthcare Power of [place];Living will Living will;Healthcare Power of [employer] Power of [place];Living will Healthcare Power of [place];Living will Healthcare Power of [place];Living will Healthcare Power of Attorney  Does patient want to make changes to medical advance directive? No - Patient declined   No - Patient declined No - Patient declined No - Patient declined No - Patient declined  Copy of Healthcare Power of Attorney in Chart? Yes - validated most recent copy scanned in chart (See row information)   Yes - validated most recent copy scanned in chart (See row information) Yes - validated most recent copy scanned in chart (See row information) Yes - validated most recent copy scanned in chart (See row information) No - copy requested    Current Medications (verified) Outpatient Encounter Medications as of 04/05/2023  Medication Sig    albuterol (VENTOLIN HFA) 108 (90 Base) MCG/ACT inhaler Inhale 2 puffs into the lungs every 6 (six) hours as needed for wheezing or shortness of breath.   atorvastatin (LIPITOR) 80 MG tablet Take 1 tablet (80 mg total) by mouth daily. (Patient taking differently: Take 80 mg by mouth every evening.)   Blood Glucose Monitoring Suppl (ONE TOUCH ULTRA 2) w/Device KIT 1 each by Does not apply route daily.   buPROPion (WELLBUTRIN SR) 150 MG 12 hr tablet Take 150 mg by mouth 2 (two) times daily.   calcipotriene (DOVONOX) 0.005 % cream Apply topically 2 (two) times daily. Apply twice a day after fluorouracil to affected areas as directed in handout.   cetirizine (ZYRTEC) 10 MG tablet Take 1 tablet (10 mg total) by mouth daily.   clonazePAM (KLONOPIN) 0.5 MG tablet Take 0.5 mg by mouth every evening.   DULoxetine (CYMBALTA) 60 MG capsule Take 60 mg by mouth every evening.   famotidine (PEPCID) 20 MG tablet Take 1 tablet (20 mg total) by mouth 2 (two) times daily.   finasteride (PROSCAR) 5 MG tablet Take 1 tablet (5 mg total) by mouth every evening.   fluticasone (FLONASE) 50 MCG/ACT nasal spray Place 2 sprays into both nostrils daily.   Fluticasone-Umeclidin-Vilant (TRELEGY ELLIPTA) 200-62.5-25 MCG/ACT AEPB Inhale 1 puff into the lungs daily.   glucose blood (ONETOUCH ULTRA) test strip 1 each by Other route daily. Use as instructed   Lancets (ONETOUCH ULTRASOFT) lancets 1 each by Other route daily. Use as instructed   metoprolol succinate (TOPROL-XL) 100  MG 24 hr tablet TAKE 1 TABLET BY MOUTH ONCE DAILY WITH OR IMMEDIATELY FOLLOWING A MEAL   mirtazapine (REMERON) 15 MG tablet Take 15 mg by mouth at bedtime.   montelukast (SINGULAIR) 10 MG tablet Take 10 mg by mouth daily.   mupirocin ointment (BACTROBAN) 2 % Apply 1 Application topically daily. Apply to any open wounds until healed.   naltrexone (DEPADE) 50 MG tablet Take 50 mg by mouth daily.   nicotine (NICODERM CQ - DOSED IN MG/24 HOURS) 21 mg/24hr  patch Place 1 patch (21 mg total) onto the skin daily.   OLANZapine (ZYPREXA) 5 MG tablet Take 5 mg by mouth at bedtime.   sildenafil (VIAGRA) 100 MG tablet Take 1 tablet (100 mg total) by mouth daily as needed for erectile dysfunction. Take two hours prior to intercourse on an empty stomach   tenofovir (VIREAD) 300 MG tablet TAKE 1 TABLET BY MOUTH ONCE DAILY   testosterone cypionate (DEPOTESTOSTERONE CYPIONATE) 200 MG/ML injection Inject 1 mL (200 mg total) into the muscle every 14 (fourteen) days.   traMADol (ULTRAM) 50 MG tablet Take 50 mg by mouth every 6 (six) hours as needed.   Facility-Administered Encounter Medications as of 04/05/2023  Medication   heparin lock flush 100 unit/mL   heparin lock flush 100 unit/mL   sodium chloride flush (NS) 0.9 % injection 10 mL    Allergies (verified) Penicillins, Tetanus toxoids, Lisinopril, Losartan, Codeine, Doxycycline, and Ruxience [rituximab-pvvr]   History: 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HCC)    Arthritis    Asthma    Basal cell carcinoma 01/28/2021   R upper arm, EDC 03/04/2021   Basal cell carcinoma 12/31/2022   Right temporal hair line. Nodular. 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   left upper back ED&C done   Hypertension    Hyponatremia    Inappropriate sinus tachycardia    Pneumonia    SCC (squamous cell carcinoma) 08/28/2021   upper  chest right of midline, EDC done 09/17/2021   SCC (squamous cell carcinoma) 09/30/2022   left chest  ED&C done   Sleep apnea    does not wear CPAP ; uses humidifier instead   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   right upper back, ED&C 06/18/2022   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   right upper back ED&C done   Squamous cell carcinoma of skin 02/19/2021   L inferior mandible, treated with EDC   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/28/2021   R ant shoulder - ED&C   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/28/2021   L upper abdomen - ED&C   Past Surgical History:  Procedure Laterality Date   ANTERIOR CERVICAL DECOMP/DISCECTOMY FUSION N/A 06/17/2016   Procedure: ANTERIOR CERVICAL DECOMPRESSION FUSION, CERVICAL 3-4, CERVICAL 4-5 WITH INSTRUMENTATION AND  ALLOGRAFT;  Surgeon: Estill Bamberg, MD;  [facility];  Service: Orthopedics;  Laterality: N/A;  ANTERIOR CERVICAL DECOMPRESSION FUSION, CERVICAL 3-4, CERVICAL 4-5 WITH INSTRUMENTATION AND ALLOGRAFT   BACK SURGERY     x3   NECK SURGERY  12/01/2007   ORIF ANKLE FRACTURE Right 12/02/2022   Procedure: OPEN REDUCTION INTERNAL FIXATION (ORIF) ANKLE FRACTURE;  Surgeon: Juanell Fairly, MD;  Location: [facility] ORS;  Service: Orthopedics;  Laterality: Right;   ORIF ANKLE FRACTURE Right 12/01/2022   Procedure: OPEN REDUCTION INTERNAL FIXATION (ORIF) ANKLE FRACTURE;  Surgeon: Juanell Fairly, MD;  Location: [facility] ORS;  Service: Orthopedics;  Laterality: Right;   PORTA CATH INSERTION N/A 04/03/2021   Procedure: PORTA CATH INSERTION;  Surgeon: Annice Needy, MD;  Location: [facility] INVASIVE CV LAB;  Service: Cardiovascular;  Laterality: N/A;   VIDEO BRONCHOSCOPY WITH ENDOBRONCHIAL ULTRASOUND Bilateral 10/14/2022   Procedure: VIDEO BRONCHOSCOPY WITH ENDOBRONCHIAL ULTRASOUND;  Surgeon: Salena Saner, MD;  Location: [facility] ORS;  Service: Pulmonary;  Laterality: Bilateral;   Family History  Problem Relation Age of Onset   Diabetes  Mother    Heart attack Father    Emphysema Father    Diabetes Other    Social History   Socioeconomic History   Marital status: Divorced    Spouse name: Not on file   Number of children: Not on file   Years of education: Not on file   Highest education level: Not on file  Occupational History   Not on file  Tobacco Use   Smoking status: Former    Types: Cigars    Quit date: 11/30/2022    Years since quitting: 0.3   Smokeless tobacco: Never  Vaping Use   Vaping Use: Former   Start date: 04/30/2017   Quit date: 11/30/2017  Substance and Sexual Activity   Alcohol use: Yes    Comment: beers 6 a day   Drug use: No   Sexual activity: Not Currently  Other Topics Concern   Not on file  Social History Narrative   Not on file   Social Determinants of Health   Financial Resource Strain: Medium Risk (04/05/2023)   Overall Financial Resource Strain (CARDIA)    Difficulty of Paying Living Expenses: Somewhat hard  Food Insecurity: No Food Insecurity (04/05/2023)   Hunger Vital Sign    Worried About Running Out of Food in the Last Year: Never true    Ran Out of Food in the Last Year: Never true  Transportation Needs: No Transportation Needs (04/05/2023)   PRAPARE - Administrator, Civil Service (Medical): No    Lack of Transportation (Non-Medical): No  Physical Activity: Inactive (01/01/2023)   Exercise Vital Sign    Days of Exercise per Week: 0 days    Minutes of Exercise per Session: 0 min  Stress: No Stress Concern Present (04/05/2023)   [name] of Occupational Health - Occupational Stress Questionnaire    Feeling of Stress : Only a little  Social Connections: Socially Isolated (04/05/2023)   Social Connection and Isolation Panel [NHANES]    Frequency of Communication with Friends and Family: More than three times a week    Frequency of Social Gatherings with Friends and Family: Twice a week    Attends Religious Services: Never    Database administrator or  Organizations: No    Attends Banker Meetings: Never    Marital Status: Divorced    Tobacco Counseling Counseling given: Not Answered   Clinical Intake:  Pre-visit preparation completed: Yes  Pain : No/denies pain     Nutritional Risks: None Diabetes: No  How often do you need to have someone help you when you read instructions, pamphlets, or other written materials from your doctor or pharmacy?: 1 - Never  Diabetic?no     Information entered by :: Kennedy Bucker, LPN   Activities of Daily Living    04/05/2023    3:38 PM 12/01/2022   11:00 AM  In your present state of health, do you have any difficulty performing the following activities:  Hearing? 0 0  Vision? 0 0  Difficulty concentrating or making decisions? 0 0  Walking or climbing stairs? 1 1  Dressing or bathing? 0 0  Doing errands, shopping? 0 0  Preparing Food and eating ? N   Using the Toilet? N   In the past six months, have you accidently leaked urine? N   Do you have problems with loss of bowel control? N   Managing your Medications? N   Managing your Finances? N   Housekeeping or managing your Housekeeping? N     Patient Care Team: [name], NP as PCP - General (Nurse Practitioner) Debbe Odea, MD as PCP - Cardiology (Cardiology) Creig Hines, MD as Consulting Physician (Oncology) Salena Saner, MD as Consulting Physician (Pulmonary Disease) Kemper Durie, RN as [facility] Care Management  Indicate any recent Medical Services you may have received from other than [facility] providers in the past year (date may be approximate).     Assessment:   This is a routine wellness examination for [place].  Hearing/Vision screen Hearing Screening - Comments:: No aids Vision Screening - Comments:: Wears glasses- Dr. Clydene Pugh  Dietary issues and exercise activities discussed: Current Exercise Habits: Home exercise routine, Type of exercise: walking, Time  (Minutes): 30, Frequency (Times/Week): 2, Weekly Exercise (Minutes/Week): 60, Intensity: Mild   Goals Addressed             This Visit's Progress    DIET - EAT MORE FRUITS AND VEGETABLES         Depression Screen    04/05/2023    3:35 PM 03/08/2023    4:27 PM 02/01/2023    1:58 PM 12/30/2022    1:23 PM 10/29/2022    2:31 PM 08/11/2022    2:47 PM 04/07/2022    2:13 PM  PHQ 2/9 Scores  PHQ - 2 Score 2 4 1 4 4 4 4   PHQ- 9 Score 5 11 7 6 10 12 6     Fall Risk    04/05/2023    3:38 PM 02/01/2023    1:58 PM 12/30/2022    1:22 PM 08/11/2022    2:41 PM 04/07/2022    2:13 PM  Fall Risk   Falls in the past year? 1 0 1 1 0  Number falls in past yr: 0 0 0 1 0  Injury with Fall? 1 0 1 1 0  Risk for fall due to : History of fall(s);Impaired balance/gait;Orthopedic patient No Fall Risks No Fall Risks Impaired balance/gait;Impaired mobility;Orthopedic patient No Fall Risks  Follow up Education provided;Falls prevention discussed;Falls evaluation completed Falls evaluation completed Falls evaluation completed Falls evaluation completed Falls evaluation completed    FALL RISK PREVENTION PERTAINING TO THE HOME:  Any stairs in or around the home? Yes  If so, are there any without handrails? No  Home free of loose throw rugs in walkways, pet beds, electrical cords, etc? Yes  Adequate lighting in  your home to reduce risk of falls? Yes   ASSISTIVE DEVICES UTILIZED TO PREVENT FALLS:  Life alert? No  Use of a cane, walker or w/c? No  Grab bars in the bathroom? No  Shower chair or bench in shower? No  Elevated toilet seat or a handicapped toilet? No   Cognitive Function:        04/05/2023    3:42 PM  6CIT Screen  What Year? 0 points  What month? 0 points  What time? 0 points  Count back from 20 2 points  Months in reverse 0 points  Repeat phrase 0 points  Total Score 2 points    Immunizations Immunization History  Administered Date(s) Administered   Influenza Split 08/05/2010,  09/30/2011   Influenza,inj,Quad PF,6+ Mos 09/25/2015, 08/31/2018, 08/21/2019, 08/30/2019, 09/12/2020, 10/20/2022   PFIZER Comirnaty(Gray Top)Covid-19 Tri-Sucrose Vaccine 02/27/2021   Tdap 05/29/2009    TDAP status: Due, Education has been provided regarding the importance of this vaccine. Advised may receive this vaccine at local pharmacy or Health Dept. Aware to provide a copy of the vaccination record if obtained from local pharmacy or Health Dept. Verbalized acceptance and understanding.  Flu Vaccine status: Up to date  Pneumococcal vaccine status: Declined,  Education has been provided regarding the importance of this vaccine but patient still declined. Advised may receive this vaccine at local pharmacy or Health Dept. Aware to provide a copy of the vaccination record if obtained from local pharmacy or Health Dept. Verbalized acceptance and understanding.   Covid-19 vaccine status: Completed vaccines  Qualifies for Shingles Vaccine? Yes   Zostavax completed No   Shingrix Completed?: No.    Education has been provided regarding the importance of this vaccine. Patient has been advised to call insurance company to determine out of pocket expense if they have not yet received this vaccine. Advised may also receive vaccine at local pharmacy or Health Dept. Verbalized acceptance and understanding.  Screening Tests Health Maintenance  Topic Date Due   Zoster Vaccines- Shingrix (1 of 2) Never done   COVID-19 Vaccine (2 - Pfizer risk series) 03/20/2021   COLONOSCOPY (Pts 45-46yrs Insurance coverage will need to be confirmed)  01/04/2023   INFLUENZA VACCINE  07/01/2023   Medicare Annual Wellness (AWV)  04/04/2024   Hepatitis C Screening  Completed   HIV Screening  Completed   HPV VACCINES  Aged Out   DTaP/Tdap/Td  Discontinued    Health Maintenance  Health Maintenance Due  Topic Date Due   Zoster Vaccines- Shingrix (1 of 2) Never done   COVID-19 Vaccine (2 - Pfizer risk series)  03/20/2021   COLONOSCOPY (Pts 45-54yrs Insurance coverage will need to be confirmed)  01/04/2023    Colorectal cancer screening: Type of screening: Colonoscopy. Completed last week per pt. Repeat every 10 years  Lung Cancer Screening: (Low Dose CT Chest recommended if Age 18-80 years, 30 pack-year currently smoking OR have quit w/in 15years.) does not qualify.   Additional Screening:  Hepatitis C Screening: does qualify; Completed 07/01/21  Vision Screening: Recommended annual ophthalmology exams for early detection of glaucoma and other disorders of the eye. Is the patient up to date with their annual eye exam?  Yes  Who is the provider or what is the name of the office in which the patient attends annual eye exams? Dr.Woodard If pt is not established with a provider, would they like to be referred to a provider to establish care? No .   Dental Screening: Recommended annual dental exams  for proper oral hygiene  Community Resource Referral / Chronic Care Management: CRR required this visit?  No   CCM required this visit?  No      Plan:     I have personally reviewed and noted the following in the patient's chart:   Medical and social history Use of alcohol, tobacco or illicit drugs  Current medications and supplements including opioid prescriptions. Patient is not currently taking opioid prescriptions. Functional ability and status Nutritional status Physical activity Advanced directives List of other physicians Hospitalizations, surgeries, and ER visits in previous 12 months Vitals Screenings to include cognitive, depression, and falls Referrals and appointments  In addition, I have reviewed and discussed with patient certain preventive protocols, quality metrics, and best practice recommendations. A written personalized care plan for preventive services as well as general preventive health recommendations were provided to patient.     Hal Hope, LPN   07/06/5783    Nurse Notes: none

## 2023-04-05 NOTE — Patient Instructions (Signed)
Russell Simpson , Thank you for taking time to come for your Medicare Wellness Visit. I appreciate your ongoing commitment to your health goals. Please review the following plan we discussed and let me know if I can assist you in the future.   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 Social Isolation  Clinical Social Work Clinical Goal(s):  Over the next 30 days, patient will work with CCM social worker  to address needs related to his anxiety and depression  Interventions:  Patient interviewed and appropriate assessments performed Provided mental health counseling with regard to patient's symptoms of anxiety and depression (mental health diagnosis or concern) Discussed plans with patient for ongoing care management follow up and provided patient with direct contact information for care management team  Assisted patient with identifying positive coping related to his anxiety and depression    Patient Self Care Activities:  Self administers medications as prescribed Attends all scheduled provider appointments Performs ADL's independently Performs IADL's independently Calls provider office for new concerns or questions  Initial goal documentation       Attend PT to help with recovrey from ankle surgery      Care Coordination Interventions: Evaluation of current treatment plan related to recent ankle surgery and patient's adherence to plan as established by provider Reviewed scheduled/upcoming provider appointments including dermatology on 5/19, and pulmonary on 6/26 Discussed plans with patient for ongoing care management follow up and provided patient with direct contact information for care management team Does not  know how to use blood glucose machine, reminded to take kit to PCP appointment today to be educated on use. Discussed ways to decrease pain in ankle, elevation when not walking helps to relieve pain/discomfort       DIET - EAT MORE FRUITS AND VEGETABLES      Patient Stated      To get health under control        This is a list of the screening recommended for you and due dates:  Health Maintenance  Topic Date Due   Zoster (Shingles) Vaccine (1 of 2) Never done   COVID-19 Vaccine (2 - Pfizer risk series) 03/20/2021   Colon Cancer Screening  01/04/2023   Flu Shot  07/01/2023   Medicare Annual Wellness Visit  04/04/2024   Hepatitis C Screening: USPSTF Recommendation to screen - Ages 18-79 yo.  Completed   HIV Screening  Completed   HPV Vaccine  Aged Out   DTaP/Tdap/Td vaccine  Discontinued    Advanced directives: no  Conditions/risks identified: none  Next appointment: Follow up in one year for your annual wellness visit 04/10/24 @ 2:00 pm by phone  Preventive Care 40-64 Years, Male Preventive care refers to lifestyle choices and visits with your health care provider that can promote health and wellness. What does preventive care include? A yearly physical exam. This is also called an annual well check. Dental exams once or twice a year. Routine eye exams. Ask your health care provider how often you should have your eyes checked. Personal lifestyle choices, including: Daily care of your teeth and gums. Regular physical activity. Eating a healthy diet. Avoiding tobacco and drug use. Limiting alcohol use.  Practicing safe sex. Taking low-dose aspirin every day starting at age 6. What happens during an annual well check? The services and screenings done by your health care provider during your annual well check will depend on your age, overall health, lifestyle risk factors, and family history of disease. Counseling  Your health care provider may ask you questions about  your: Alcohol use. Tobacco use. Drug use. Emotional well-being. Home and relationship well-being. Sexual activity. Eating habits. Work and work Astronomer. Screening  You may have the following tests or measurements: Height, weight, and BMI. Blood pressure. Lipid and cholesterol levels. These may be checked every 5 years, or more frequently if you are over 24 years old. Skin check. Lung cancer screening. You may have this screening every year starting at age 69 if you have a 30-pack-year history of smoking and currently smoke or have quit within the past 15 years. Fecal occult blood test (FOBT) of the stool. You may have this test every year starting at age 20. Flexible sigmoidoscopy or colonoscopy. You may have a sigmoidoscopy every 5 years or a colonoscopy every 10 years starting at age 86. Prostate cancer screening. Recommendations will vary depending on your family history and other risks. Hepatitis C blood test. Hepatitis B blood test. Sexually transmitted disease (STD) testing. Diabetes screening. This is done by checking your blood sugar (glucose) after you have not eaten for a while (fasting). You may have this done every 1-3 years. Discuss your test results, treatment options, and if necessary, the need for more tests with your health care provider. Vaccines  Your health care provider may recommend certain vaccines, such as: Influenza vaccine. This is recommended every year. Tetanus, diphtheria, and acellular pertussis (Tdap, Td) vaccine. You may need a Td booster every 10 years. Zoster vaccine. You may need this after age 50. Pneumococcal 13-valent conjugate (PCV13) vaccine. You may need this if you have certain conditions and have not been vaccinated. Pneumococcal polysaccharide (PPSV23) vaccine. You may need one or two doses if you smoke cigarettes or if you have certain conditions. Talk to your health care provider about which screenings and vaccines you need and how  often you need them. This information is not intended to replace advice given to you by your health care provider. Make sure you discuss any questions you have with your health care provider. Document Released: 12/13/2015 Document Revised: 08/05/2016 Document Reviewed: 09/17/2015 Elsevier Interactive Patient Education  2017 [employer].  Fall Prevention in the Home Falls can cause injuries. They can happen to people of all ages. There are many things you can do to make your home safe and to help prevent falls. What can I do on the outside of my home? Regularly fix the edges of walkways and driveways and fix any cracks. Remove anything that might make you trip as you walk through a door, such as a raised step or threshold. Trim any bushes or trees on the path to your home. Use bright outdoor lighting. Clear any walking paths of anything that might make someone trip, such as rocks or tools. Regularly check to see if handrails are loose or broken. Make sure that both sides of any steps have handrails. Any raised decks and porches should have guardrails on the edges. Have any leaves, snow, or ice cleared regularly. Use sand or salt on walking paths during winter. Clean up any spills in your garage right away. This includes oil or grease spills. What can I do in the bathroom? Use night  lights. Install grab bars by the toilet and in the tub and shower. Do not use towel bars as grab bars. Use non-skid mats or decals in the tub or shower. If you need to sit down in the shower, use a plastic, non-slip stool. Keep the floor dry. Clean up any water that spills on the floor as soon as it happens. Remove soap buildup in the tub or shower regularly. Attach bath mats securely with double-sided non-slip rug tape. Do not have throw rugs and other things on the floor that can make you trip. What can I do in the bedroom? Use night lights. Make sure that you have a light by your bed that is easy to  reach. Do not use any sheets or blankets that are too big for your bed. They should not hang down onto the floor. Have a firm chair that has side arms. You can use this for support while you get dressed. Do not have throw rugs and other things on the floor that can make you trip. What can I do in the kitchen? Clean up any spills right away. Avoid walking on wet floors. Keep items that you use a lot in easy-to-reach places. If you need to reach something above you, use a strong step stool that has a grab bar. Keep electrical cords out of the way. Do not use floor polish or wax that makes floors slippery. If you must use wax, use non-skid floor wax. Do not have throw rugs and other things on the floor that can make you trip. What can I do with my stairs? Do not leave any items on the stairs. Make sure that there are handrails on both sides of the stairs and use them. Fix handrails that are broken or loose. Make sure that handrails are as long as the stairways. Check any carpeting to make sure that it is firmly attached to the stairs. Fix any carpet that is loose or worn. Avoid having throw rugs at the top or bottom of the stairs. If you do have throw rugs, attach them to the floor with carpet tape. Make sure that you have a light switch at the top of the stairs and the bottom of the stairs. If you do not have them, ask someone to add them for you. What else can I do to help prevent falls? Wear shoes that: Do not have high heels. Have rubber bottoms. Are comfortable and fit you well. Are closed at the toe. Do not wear sandals. If you use a stepladder: Make sure that it is fully opened. Do not climb a closed stepladder. Make sure that both sides of the stepladder are locked into place. Ask someone to hold it for you, if possible. Clearly mark and make sure that you can see: Any grab bars or handrails. First and last steps. Where the edge of each step is. Use tools that help you move  around (mobility aids) if they are needed. These include: Canes. Walkers. Scooters. Crutches. Turn on the lights when you go into a dark area. Replace any light bulbs as soon as they burn out. Set up your furniture so you have a clear path. Avoid moving your furniture around. If any of your floors are uneven, fix them. If there are any pets around you, be aware of where they are. Review your medicines with your doctor. Some medicines can make you feel dizzy. This can increase your chance of falling. Ask your doctor what other things that  you can do to help prevent falls. This information is not intended to replace advice given to you by your health care provider. Make sure you discuss any questions you have with your health care provider. Document Released: 09/12/2009 Document Revised: 04/23/2016 Document Reviewed: 12/21/2014 Elsevier Interactive Patient Education  2017 [employer].

## 2023-04-06 ENCOUNTER — Ambulatory Visit
Admission: RE | Admit: 2023-04-06 | Discharge: 2023-04-06 | Disposition: A | Discharge status: Home / Self Care | Payer: Medicare HMO | Source: Home / Self Care | Attending: Nurse Practitioner | Admitting: Nurse Practitioner

## 2023-04-06 ENCOUNTER — Ambulatory Visit
Admission: RE | Admit: 2023-04-06 | Discharge: 2023-04-06 | Disposition: A | Discharge status: Home / Self Care | Payer: Medicare HMO | Source: Ambulatory Visit | Attending: Nurse Practitioner | Admitting: Nurse Practitioner

## 2023-04-06 ENCOUNTER — Other Ambulatory Visit: Admission: RE | Admit: 2023-04-06 | Payer: Medicare HMO | Source: Home / Self Care

## 2023-04-06 DIAGNOSIS — R051 Acute cough: Secondary | ICD-10-CM | POA: Insufficient documentation

## 2023-04-06 DIAGNOSIS — R059 Cough, unspecified: Secondary | ICD-10-CM | POA: Diagnosis not present

## 2023-04-06 DIAGNOSIS — R911 Solitary pulmonary nodule: Secondary | ICD-10-CM | POA: Diagnosis not present

## 2023-04-07 DIAGNOSIS — M25571 Pain in right ankle and joints of right foot: Secondary | ICD-10-CM | POA: Diagnosis not present

## 2023-04-07 DIAGNOSIS — S82832A Other fracture of upper and lower end of left fibula, initial encounter for closed fracture: Secondary | ICD-10-CM | POA: Diagnosis not present

## 2023-04-07 DIAGNOSIS — T8484XA Pain due to internal orthopedic prosthetic devices, implants and grafts, initial encounter: Secondary | ICD-10-CM | POA: Diagnosis not present

## 2023-04-07 DIAGNOSIS — M25671 Stiffness of right ankle, not elsewhere classified: Secondary | ICD-10-CM | POA: Diagnosis not present

## 2023-04-08 ENCOUNTER — Ambulatory Visit: Payer: Medicare HMO | Admitting: Dermatology

## 2023-04-08 VITALS — BP 125/76 | HR 95

## 2023-04-08 DIAGNOSIS — X32XXXA Exposure to sunlight, initial encounter: Secondary | ICD-10-CM | POA: Diagnosis not present

## 2023-04-08 DIAGNOSIS — W908XXA Exposure to other nonionizing radiation, initial encounter: Secondary | ICD-10-CM

## 2023-04-08 DIAGNOSIS — B079 Viral wart, unspecified: Secondary | ICD-10-CM | POA: Diagnosis not present

## 2023-04-08 DIAGNOSIS — L309 Dermatitis, unspecified: Secondary | ICD-10-CM

## 2023-04-08 DIAGNOSIS — Z85828 Personal history of other malignant neoplasm of skin: Secondary | ICD-10-CM

## 2023-04-08 DIAGNOSIS — T498X5A Adverse effect of other topical agents, initial encounter: Secondary | ICD-10-CM

## 2023-04-08 DIAGNOSIS — L578 Other skin changes due to chronic exposure to nonionizing radiation: Secondary | ICD-10-CM | POA: Diagnosis not present

## 2023-04-08 DIAGNOSIS — L570 Actinic keratosis: Secondary | ICD-10-CM

## 2023-04-08 DIAGNOSIS — Z5111 Encounter for antineoplastic chemotherapy: Secondary | ICD-10-CM

## 2023-04-08 MED ORDER — TRIAMCINOLONE ACETONIDE 0.1 % EX OINT: TOPICAL_OINTMENT | CUTANEOUS | 0 refills | Status: DC

## 2023-04-08 NOTE — Patient Instructions (Signed)
Due to recent changes in healthcare laws, you may see results of your pathology and/or laboratory studies on MyChart before the doctors have had a chance to review them. We understand that in some cases there may be results that are confusing or concerning to you. Please understand that not all results are received at the same time and often the doctors may need to interpret multiple results in order to provide you with the best plan of care or course of treatment. Therefore, we ask that you please give us 2 business days to thoroughly review all your results before contacting the office for clarification. Should we see a critical lab result, you will be contacted sooner.   If You Need Anything After Your Visit  If you have any questions or concerns for your doctor, please call our main line at [phone] and press option 4 to reach your doctor's medical assistant. If no one answers, please leave a voicemail as directed and we will return your call as soon as possible. Messages left after 4 pm will be answered the following business day.   You may also send us a message via MyChart. We typically respond to MyChart messages within 1-2 business days.  For prescription refills, please ask your pharmacy to contact our office. Our fax number is [phone].  If you have an urgent issue when the clinic is closed that cannot wait until the next business day, you can page your doctor at the number below.    Please note that while we do our best to be available for urgent issues outside of office hours, we are not available 24/7.   If you have an urgent issue and are unable to reach us, you may choose to seek medical care at your doctor's office, retail clinic, urgent care center, or emergency room.  If you have a medical emergency, please immediately call 911 or go to the emergency department.  Pager Numbers  - Dr. Kowalski: [phone]  - Dr. Moye: [phone]  - Dr. Stewart:  [phone]  In the event of inclement weather, please call our main line at [phone] for an update on the status of any delays or closures.  Dermatology Medication Tips: Please keep the boxes that topical medications come in in order to help keep track of the instructions about where and how to use these. Pharmacies typically print the medication instructions only on the boxes and not directly on the medication tubes.   If your medication is too expensive, please contact our office at [phone] option 4 or send us a message through MyChart.   We are unable to tell what your co-pay for medications will be in advance as this is different depending on your insurance coverage. However, we may be able to find a substitute medication at lower cost or fill out paperwork to get insurance to cover a needed medication.   If a prior authorization is required to get your medication covered by your insurance company, please allow us 1-2 business days to complete this process.  Drug prices often vary depending on where the prescription is filled and some pharmacies may offer cheaper prices.  The website www.goodrx.com contains coupons for medications through different pharmacies. The prices here do not account for what the cost may be with help from insurance (it may be cheaper with your insurance), but the website can give you the price if you did not use any insurance.  - You can print the associated coupon and take it with   your prescription to the pharmacy.  - You may also stop by our office during regular business hours and pick up a GoodRx coupon card.  - If you need your prescription sent electronically to a different pharmacy, notify our office through [facility] MyChart or by phone at [phone] option 4.     Si Usted Necesita Algo Despus de Su Visita  Tambin puede enviarnos un mensaje a travs de MyChart. Por lo general respondemos a los mensajes de MyChart en el transcurso de 1 a 2  das hbiles.  Para renovar recetas, por favor pida a su farmacia que se ponga en contacto con nuestra oficina. Nuestro nmero de fax es el [phone].  Si tiene un asunto urgente cuando la clnica est cerrada y que no puede esperar hasta el siguiente da hbil, puede llamar/localizar a su doctor(a) al nmero que aparece a continuacin.   Por favor, tenga en cuenta que aunque hacemos todo lo posible para estar disponibles para asuntos urgentes fuera del horario de oficina, no estamos disponibles las 24 horas del da, los 7 das de la semana.   Si tiene un problema urgente y no puede comunicarse con nosotros, puede optar por buscar atencin mdica  en el consultorio de su doctor(a), en una clnica privada, en un centro de atencin urgente o en una sala de emergencias.  Si tiene una emergencia mdica, por favor llame inmediatamente al 911 o vaya a la sala de emergencias.  Nmeros de bper  - Dr. Kowalski: [phone]  - Dra. Moye: [phone]  - Dra. Stewart: [phone]  En caso de inclemencias del tiempo, por favor llame a nuestra lnea principal al [phone] para una actualizacin sobre el estado de cualquier retraso o cierre.  Consejos para la medicacin en dermatologa: Por favor, guarde las cajas en las que vienen los medicamentos de uso tpico para ayudarle a seguir las instrucciones sobre dnde y cmo usarlos. Las farmacias generalmente imprimen las instrucciones del medicamento slo en las cajas y no directamente en los tubos del medicamento.   Si su medicamento es muy caro, por favor, pngase en contacto con nuestra oficina llamando al [phone] y presione la opcin 4 o envenos un mensaje a travs de MyChart.   No podemos decirle cul ser su copago por los medicamentos por adelantado ya que esto es diferente dependiendo de la cobertura de su seguro. Sin embargo, es posible que podamos encontrar un medicamento sustituto a menor costo o llenar un formulario para que el  seguro cubra el medicamento que se considera necesario.   Si se requiere una autorizacin previa para que su compaa de seguros cubra su medicamento, por favor permtanos de 1 a 2 das hbiles para completar este proceso.  Los precios de los medicamentos varan con frecuencia dependiendo del lugar de dnde se surte la receta y alguna farmacias pueden ofrecer precios ms baratos.  El sitio web www.goodrx.com tiene cupones para medicamentos de diferentes farmacias. Los precios aqu no tienen en cuenta lo que podra costar con la ayuda del seguro (puede ser ms barato con su seguro), pero el sitio web puede darle el precio si no utiliz ningn seguro.  - Puede imprimir el cupn correspondiente y llevarlo con su receta a la farmacia.  - Tambin puede pasar por nuestra oficina durante el horario de atencin regular y recoger una tarjeta de cupones de GoodRx.  - Si necesita que su receta se enve electrnicamente a una farmacia diferente, informe a nuestra oficina a travs de MyChart de [place]   o por telfono llamando al [phone] y presione la opcin 4.  

## 2023-04-08 NOTE — Progress Notes (Signed)
Follow-Up Visit   Subjective  Russell Simpson is a 62 y.o. male who presents for the following: Spots - on the arms from using topical 5FU cream a couple of weeks ago, patient would like to discuss treatment options to help the redness fade quicker. Pt has noticed a discoloration on the sides of his feet as well as numbness and tingling, and is concerned he may have neuropathy.   The patient has spots, moles and lesions to be evaluated, some may be new or changing and the patient has concerns that these could be cancer.  The following portions of the chart were reviewed this encounter and updated as appropriate: medications, allergies, medical history  Review of Systems:  No other skin or systemic complaints except as noted in HPI or Assessment and Plan.  Objective  Well appearing patient in no apparent distress; mood and affect are within normal limits.  A focused examination was performed of the following areas: The face, arms, and feet   Relevant physical exam findings are noted in the Assessment and Plan.  R mid back x 1 Verrucous papules -- Discussed viral etiology and contagion.    Assessment & Plan   Viral warts, unspecified type R mid back x 1  Verruca within SK   Viral Wart (HPV) Counseling  Discussed viral / HPV (Human Papilloma Virus) etiology and risk of spread /infectivity to other areas of body as well as to other people.  Multiple treatments and methods may be required to clear warts and it is possible treatment may not be successful.  Treatment risks include discoloration; scarring and there is still potential for wart recurrence.   Destruction of lesion - R mid back x 1 Complexity: simple   Destruction method: cryotherapy   Informed consent: discussed and consent obtained   Timeout:  patient name, date of birth, surgical site, and procedure verified Lesion destroyed using liquid nitrogen: Yes   Region frozen until ice ball extended beyond lesion: Yes    Outcome: patient tolerated procedure well with no complications   Post-procedure details: wound care instructions given   Additional details:  Prior to procedure, discussed risks of blister formation, small wound, skin dyspigmentation, or rare scar following cryotherapy. Recommend Vaseline ointment to treated areas while healing.    Dermatitis secondary to topical 5FU/Calcipotriene mix on the arms  Exam: Scaly pink papules coalescing to plaques  Treatment Plan:  Start TMC 0.1% ointment BID up to two weeks. Topical steroids (such as triamcinolone, fluocinolone, fluocinonide, mometasone, clobetasol, halobetasol, betamethasone, hydrocortisone) can cause thinning and lightening of the skin if they are used for too long in the same area. Your physician has selected the right strength medicine for your problem and area affected on the body. Please use your medication only as directed by your physician to prevent side effects.    POSSIBLE NEUROPATHY AT FEET Patient to follow up with PCP for evaluation and treatment. Can try OTC Gold Bond anti-itch lotion in the meantime.   ACTINIC DAMAGE WITH PRECANCEROUS ACTINIC KERATOSES Counseling for Topical Chemotherapy Management: Patient exhibits: - Severe, confluent actinic changes with pre-cancerous actinic keratoses that is secondary to cumulative UV radiation exposure over time - Condition that is severe; chronic, not at goal. - diffuse scaly erythematous macules and papules with underlying dyspigmentation - Discussed Prescription "Field Treatment" topical Chemotherapy for Severe, Chronic Confluent Actinic Changes with Pre-Cancerous Actinic Keratoses Field treatment involves treatment of an entire area of skin that has confluent Actinic Changes (Sun/ Ultraviolet light damage)  and PreCancerous Actinic Keratoses by method of PhotoDynamic Therapy (PDT) and/or prescription Topical Chemotherapy agents such as 5-fluorouracil, 5-fluorouracil/calcipotriene,  and/or imiquimod.  The purpose is to decrease the number of clinically evident and subclinical PreCancerous lesions to prevent progression to development of skin cancer by chemically destroying early precancer changes that may or may not be visible.  It has been shown to reduce the risk of developing skin cancer in the treated area. As a result of treatment, redness, scaling, crusting, and open sores may occur during treatment course. One or more than one of these methods may be used and may have to be used several times to control, suppress and eliminate the PreCancerous changes. Discussed treatment course, expected reaction, and possible side effects. - Recommend daily broad spectrum sunscreen SPF 30+ to sun-exposed areas, reapply every 2 hours as needed.  - Staying in the shade or wearing long sleeves, sun glasses (UVA+UVB protection) and wide brim hats (4-inch brim around the entire circumference of the hat) are also recommended. - Call for new or changing lesions. - Start 5FU/Calcipotriene mix BID x 4 days. Reviewed course of treatment and expected reaction.  Patient advised to expect inflammation and crusting and advised that erosions are possible.  Patient advised to be diligent with sun protection during and after treatment. Handout with details of how to apply medication and what to expect provided. Counseled to keep medication out of reach of children and pets.  HISTORY OF BASAL CELL CARCINOMA OF THE SKIN - No evidence of recurrence today - Recommend regular full body skin exams - Recommend daily broad spectrum sunscreen SPF 30+ to sun-exposed areas, reapply every 2 hours as needed.  - Call if any new or changing lesions are noted between office visits  Return in about 3 months (around 07/09/2023) for TBSE with Dr. Katrinka Blazing.  Maylene Roes, CMA, am acting as scribe for Darden Dates, MD .  Documentation: I have reviewed the above documentation for accuracy and completeness, and I agree with  the above.  Darden Dates, MD

## 2023-04-09 ENCOUNTER — Inpatient Hospital Stay: Payer: Medicare HMO

## 2023-04-09 ENCOUNTER — Inpatient Hospital Stay: Payer: Medicare HMO | Attending: Oncology | Admitting: Oncology

## 2023-04-09 ENCOUNTER — Encounter: Payer: Self-pay | Admitting: Oncology

## 2023-04-09 VITALS — BP 123/86 | HR 93 | Temp 96.2°F | Resp 18 | Ht 74.0 in | Wt 238.0 lb

## 2023-04-09 DIAGNOSIS — Z7189 Other specified counseling: Secondary | ICD-10-CM

## 2023-04-09 DIAGNOSIS — Z95828 Presence of other vascular implants and grafts: Secondary | ICD-10-CM

## 2023-04-09 DIAGNOSIS — Z87891 Personal history of nicotine dependence: Secondary | ICD-10-CM | POA: Diagnosis not present

## 2023-04-09 DIAGNOSIS — C8202 Follicular lymphoma grade I, intrathoracic lymph nodes: Secondary | ICD-10-CM

## 2023-04-09 DIAGNOSIS — Z8572 Personal history of non-Hodgkin lymphomas: Secondary | ICD-10-CM | POA: Insufficient documentation

## 2023-04-09 DIAGNOSIS — Z452 Encounter for adjustment and management of vascular access device: Secondary | ICD-10-CM | POA: Diagnosis not present

## 2023-04-09 LAB — CBC WITH DIFFERENTIAL/PLATELET
Abs Immature Granulocytes: 0.04 10*3/uL (ref 0.00–0.07)
Basophils Absolute: 0 10*3/uL (ref 0.0–0.1)
Basophils Relative: 0 %
Eosinophils Absolute: 0.2 10*3/uL (ref 0.0–0.5)
Eosinophils Relative: 2 %
HCT: 44.3 % (ref 39.0–52.0)
Hemoglobin: 15.2 g/dL (ref 13.0–17.0)
Immature Granulocytes: 1 %
Lymphocytes Relative: 57 %
Lymphs Abs: 4.7 10*3/uL — ABNORMAL HIGH (ref 0.7–4.0)
MCH: 31.5 pg (ref 26.0–34.0)
MCHC: 34.3 g/dL (ref 30.0–36.0)
MCV: 91.9 fL (ref 80.0–100.0)
Monocytes Absolute: 0.7 10*3/uL (ref 0.1–1.0)
Monocytes Relative: 8 %
Neutro Abs: 2.7 10*3/uL (ref 1.7–7.7)
Neutrophils Relative %: 32 %
Platelets: 105 10*3/uL — ABNORMAL LOW (ref 150–400)
RBC: 4.82 MIL/uL (ref 4.22–5.81)
RDW: 12.5 % (ref 11.5–15.5)
WBC: 8.3 10*3/uL (ref 4.0–10.5)
nRBC: 0 % (ref 0.0–0.2)

## 2023-04-09 LAB — COMPREHENSIVE METABOLIC PANEL
ALT: 27 U/L (ref 0–44)
AST: 36 U/L (ref 15–41)
Albumin: 4.3 g/dL (ref 3.5–5.0)
Alkaline Phosphatase: 160 U/L — ABNORMAL HIGH (ref 38–126)
Anion gap: 14 (ref 5–15)
BUN: 11 mg/dL (ref 6–20)
CO2: 22 mmol/L (ref 22–32)
Calcium: 8.8 mg/dL — ABNORMAL LOW (ref 8.9–10.3)
Chloride: 97 mmol/L — ABNORMAL LOW (ref 98–111)
Creatinine, Ser: 1.15 mg/dL (ref 0.61–1.24)
GFR, Estimated: >60 mL/min (ref >60)
Glucose, Bld: 169 mg/dL — ABNORMAL HIGH (ref 70–99)
Potassium: 4.2 mmol/L (ref 3.5–5.1)
Sodium: 133 mmol/L — ABNORMAL LOW (ref 135–145)
Total Bilirubin: 0.6 mg/dL (ref 0.3–1.2)
Total Protein: 6.7 g/dL (ref 6.5–8.1)

## 2023-04-09 MED ORDER — SODIUM CHLORIDE 0.9% FLUSH
10.0000 mL | Freq: Once | INTRAVENOUS | Status: AC
  Administered 2023-04-09: 10 mL via INTRAVENOUS
  Filled 2023-04-09: qty 10

## 2023-04-09 MED ORDER — HEPARIN SOD (PORK) LOCK FLUSH 100 UNIT/ML IV SOLN
500.0000 [IU] | Freq: Once | INTRAVENOUS | Status: AC
  Administered 2023-04-09: 500 [IU] via INTRAVENOUS
  Filled 2023-04-09: qty 5

## 2023-04-11 NOTE — Progress Notes (Signed)
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 [name]  He sustained a left anterior frontal sinus as well as supraorbital rim fracture on 11/21/2019 which was managed conservatively.  He then presented to the ER at [facility] with symptoms of right-sided chest wall pain this led to a CT PE which did not show any evidence of pulmonary embolism.  He was noted to have bilateral symmetric axillary adenopathy measuring up to 1.8 cm.  Scattered mediastinal adenopathy.  Right paratracheal node measuring up to 1.2 cm.  Prominent right costophrenic lymph node measuring up to 1 cm.  Findings are nonspecific but could be seen with lymphoma versus systemic inflammatory disease such as sarcoidosis.  Of note patient has had a prior CT scan for lung screening back in 2015 when he was not noted to have this adenopathy.    Repeat CT chest in August 2021 showed persistent mild nonbulky axillary and mediastinal adenopathy which had not changed significantly as compared to his prior CT in December 2020.  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   Repeat CT in March  2022 showed Progression and multistation adenopathy as well as significant splenomegaly measuring 20.4 cm as compared to 14.5 cm on CT scan September 2021.  CT scan showed multistation adenopathy with SUVs ranging between 6-8.9.  Patient underwent another core biopsy of right inguinal lymph node which was consistent with follicular lymphoma grade 1-2.  Bone marrow biopsy also showed moderate involvement with non-Hodgkin's B-cell lymphoma.  Baseline hepatitis B testing showed core antibody positivity.  Seen by GI and started on tenofovir   Patient had symptoms of nausea and sweating during cycle 1 of Rituxan which resolved with additional premedications.  He developed symptoms of itching over neck chest back and bilateral eyes with second dose of Rituxan early and had to get more premedications and was not rechallenged on the same day.  Patient subsequently received cycle 3 of Bendamustine Rituxan chemotherapy after Rituxan was given as a split dose over 3 days   Scans after 6 cycles of Bendamustine Rituxan chemotherapy in October 2022 showed significant improvement with therapy.  Lymphadenopathy in his neck and mediastinum resolved Deauville 2.  Decrease in the splenic size and hypermetabolism.  No new lesions.  FDG accumulation within the skeleton presumably due to stimulatory effects of treatment.   Scans in April 2023 showed pulmonary nodules requiring further assessment and patient was seen by pulmonary.  He also had a PET CT scan in October 2023 which showed gradual enlargement and hypermetabolic some noted in the lung nodules.  Patient also has intra-abdominal lymph nodes ranging from 9 to 1.2 cm with an SUV less than 3.  Patient had right lower lobe lung biopsy which was negative  for malignancy.  Interval history- Drinks at least 6 beers daily.  States that his appetite is good and denies any significant fatigue or unintentional weight loss or drenching night sweats.  Denies any shortness of  breath   ECOG PS- 1 Pain scale- 0   Review of systems- Review of Systems  Constitutional:  Negative for chills, fever, malaise/fatigue and weight loss.  HENT:  Negative for congestion, ear discharge and nosebleeds.   Eyes:  Negative for blurred vision.  Respiratory:  Negative for cough, hemoptysis, sputum production, shortness of breath and wheezing.   Cardiovascular:  Negative for chest pain, palpitations, orthopnea and claudication.  Gastrointestinal:  Negative for abdominal pain, blood in stool, constipation, diarrhea, heartburn, melena, nausea and vomiting.  Genitourinary:  Negative for dysuria, flank pain, frequency, hematuria and urgency.  Musculoskeletal:  Negative for back pain, joint pain and myalgias.  Skin:  Negative for rash.  Neurological:  Negative for dizziness, tingling, focal weakness, seizures, weakness and headaches.  Endo/Heme/Allergies:  Does not bruise/bleed easily.  Psychiatric/Behavioral:  Negative for depression and suicidal ideas. The patient does not have insomnia.       Allergies  Allergen Reactions   Penicillins Anaphylaxis    Tolerates cefdinir, cephalexin and amoxicillin/clavulonate so true PCN allergy unlikely   Tetanus Toxoids Swelling   Lisinopril Cough   Losartan      Other reaction(s): Muscle Pain     Codeine Nausea Only and Nausea And Vomiting   Doxycycline Rash   Ruxience [Rituximab-Pvvr] Rash    05/06/21 pt w/ worsening rash during Ruxience infusion.  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HCC)    Arthritis    Asthma    Basal cell carcinoma 01/28/2021   R upper arm, EDC 03/04/2021   Basal cell carcinoma 12/31/2022   Right temporal hair line. Nodular. 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   left upper back ED&C done   Hypertension    Hyponatremia    Inappropriate sinus tachycardia    Pneumonia    SCC (squamous cell carcinoma) 08/28/2021   upper chest right of midline, EDC done 09/17/2021   SCC (squamous cell carcinoma) 09/30/2022   left chest  ED&C done   Sleep apnea    does not wear CPAP ; uses humidifier instead   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   right upper back, ED&C 06/18/2022   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   right upper back ED&C done   Squamous cell carcinoma of skin 02/19/2021   L inferior mandible, treated with EDC   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/28/2021   R ant shoulder - ED&C   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/28/2021   L upper abdomen - ED&C  Past Surgical History:  Procedure Laterality Date   ANTERIOR CERVICAL DECOMP/DISCECTOMY FUSION N/A 06/17/2016   Procedure: ANTERIOR CERVICAL DECOMPRESSION FUSION, CERVICAL 3-4, CERVICAL 4-5 WITH INSTRUMENTATION AND ALLOGRAFT;  Surgeon: Estill Bamberg, MD;  Location: [facility] OR;  Service: Orthopedics;  Laterality: N/A;  ANTERIOR CERVICAL DECOMPRESSION FUSION, CERVICAL 3-4, CERVICAL 4-5 WITH INSTRUMENTATION AND ALLOGRAFT   BACK SURGERY     x3   NECK SURGERY  12/01/2007   ORIF ANKLE FRACTURE Right 12/02/2022   Procedure: OPEN REDUCTION INTERNAL FIXATION (ORIF) ANKLE FRACTURE;  Surgeon: Juanell Fairly, MD;  Location: [facility] ORS;  Service: Orthopedics;  Laterality: Right;   ORIF ANKLE FRACTURE Right 12/01/2022   Procedure: OPEN REDUCTION INTERNAL FIXATION (ORIF) ANKLE FRACTURE;  Surgeon:  Juanell Fairly, MD;  Location: [facility] ORS;  Service: Orthopedics;  Laterality: Right;   PORTA CATH INSERTION N/A 04/03/2021   Procedure: PORTA CATH INSERTION;  Surgeon: Annice Needy, MD;  Location: [facility] INVASIVE [facility];  Service: Cardiovascular;  Laterality: N/A;   VIDEO BRONCHOSCOPY WITH ENDOBRONCHIAL ULTRASOUND Bilateral 10/14/2022   Procedure: VIDEO BRONCHOSCOPY WITH ENDOBRONCHIAL ULTRASOUND;  Surgeon: Salena Saner, MD;  Location: [facility] ORS;  Service: Pulmonary;  Laterality: Bilateral;    Social History   Socioeconomic History   Marital status: Divorced    Spouse name: Not on file   Number of children: Not on file   Years of education: Not on file   Highest education level: Not on file  Occupational History   Not on file  Tobacco Use   Smoking status: Former    Types: Cigars    Quit date: 11/30/2022    Years since quitting: 0.3   Smokeless tobacco: Never  Vaping Use   Vaping Use: Former   Start date: 04/30/2017   Quit date: 11/30/2017  Substance and Sexual Activity   Alcohol use: Yes    Comment: beers 6 a day   Drug use: No   Sexual activity: Not Currently  Other Topics Concern   Not on file  Social History Narrative   Not on file   Social Determinants of Health   Financial Resource Strain: Medium Risk (04/05/2023)   Overall Financial Resource Strain (CARDIA)    Difficulty of Paying Living Expenses: Somewhat hard  Food Insecurity: No Food Insecurity (04/05/2023)   Hunger Vital Sign    Worried About Running Out of Food in the Last Year: Never true    Ran Out of Food in the Last Year: Never true  Transportation Needs: No Transportation Needs (04/05/2023)   PRAPARE - Administrator, Civil Service (Medical): No    Lack of Transportation (Non-Medical): No  Physical Activity: Inactive (01/01/2023)   Exercise Vital Sign    Days of Exercise per Week: 0 days    Minutes of Exercise per Session: 0 min  Stress: No Stress Concern Present (04/05/2023)   [name]  of Occupational Health - Occupational Stress Questionnaire    Feeling of Stress : Only a little  Social Connections: Socially Isolated (04/05/2023)   Social Connection and Isolation Panel [NHANES]    Frequency of Communication with Friends and Family: More than three times a week    Frequency of Social Gatherings with Friends and Family: Twice a week    Attends Religious Services: Never    Database administrator or Organizations: No    Attends Banker Meetings: Never    Marital Status: Divorced  Catering manager Violence: Not At Risk (04/05/2023)   Humiliation, Afraid, Rape, and  Kick questionnaire    Fear of Current or Ex-Partner: No    Emotionally Abused: No    Physically Abused: No    Sexually Abused: No    Family History  Problem Relation Age of Onset   Diabetes Mother    Heart attack Father    Emphysema Father    Diabetes Other      Current Outpatient Medications:    albuterol (VENTOLIN HFA) 108 (90 Base) MCG/ACT inhaler, Inhale 2 puffs into the lungs every 6 (six) hours as needed for wheezing or shortness of breath., Disp: 18 g, Rfl: 6   atorvastatin (LIPITOR) 80 MG tablet, Take 1 tablet (80 mg total) by mouth daily. (Patient taking differently: Take 80 mg by mouth every evening.), Disp: 90 tablet, Rfl: 3   Blood Glucose Monitoring Suppl (ONE TOUCH ULTRA 2) w/Device KIT, 1 each by Does not apply route daily., Disp: 1 kit, Rfl: 0   buPROPion (WELLBUTRIN SR) 150 MG 12 hr tablet, Take 150 mg by mouth 2 (two) times daily., Disp: , Rfl:    calcipotriene (DOVONOX) 0.005 % cream, Apply topically 2 (two) times daily. Apply twice a day after fluorouracil to affected areas as directed in handout., Disp: 60 g, Rfl: 1   cetirizine (ZYRTEC) 10 MG tablet, Take 1 tablet (10 mg total) by mouth daily., Disp: 30 tablet, Rfl: 11   clonazePAM (KLONOPIN) 0.5 MG tablet, Take 0.5 mg by mouth every evening., Disp: , Rfl:    DULoxetine (CYMBALTA) 60 MG capsule, Take 60 mg by mouth every  evening., Disp: , Rfl:    famotidine (PEPCID) 20 MG tablet, Take 1 tablet (20 mg total) by mouth 2 (two) times daily., Disp: 180 tablet, Rfl: 1   finasteride (PROSCAR) 5 MG tablet, Take 1 tablet (5 mg total) by mouth every evening., Disp: 90 tablet, Rfl: 0   fluticasone (FLONASE) 50 MCG/ACT nasal spray, Place 2 sprays into both nostrils daily., Disp: 16 g, Rfl: 6   Fluticasone-Umeclidin-Vilant (TRELEGY ELLIPTA) 200-62.5-25 MCG/ACT AEPB, Inhale 1 puff into the lungs daily., Disp: 14 each, Rfl: 0   glucose blood (ONETOUCH ULTRA) test strip, 1 each by Other route daily. Use as instructed, Disp: 100 each, Rfl: 3   Lancets (ONETOUCH ULTRASOFT) lancets, 1 each by Other route daily. Use as instructed, Disp: 100 each, Rfl: 3   metoprolol succinate (TOPROL-XL) 100 MG 24 hr tablet, TAKE 1 TABLET BY MOUTH ONCE DAILY WITH OR IMMEDIATELY FOLLOWING A MEAL, Disp: 90 tablet, Rfl: 1   mirtazapine (REMERON) 15 MG tablet, Take 15 mg by mouth at bedtime., Disp: , Rfl:    montelukast (SINGULAIR) 10 MG tablet, Take 10 mg by mouth daily., Disp: , Rfl:    mupirocin ointment (BACTROBAN) 2 %, Apply 1 Application topically daily. Apply to any open wounds until healed., Disp: 22 g, Rfl: 3   naltrexone (DEPADE) 50 MG tablet, Take 50 mg by mouth daily., Disp: , Rfl:    nicotine (NICODERM CQ - DOSED IN MG/24 HOURS) 21 mg/24hr patch, Place 1 patch (21 mg total) onto the skin daily., Disp: 28 patch, Rfl: 0   OLANZapine (ZYPREXA) 5 MG tablet, Take 5 mg by mouth at bedtime., Disp: , Rfl:    sildenafil (VIAGRA) 100 MG tablet, Take 1 tablet (100 mg total) by mouth daily as needed for erectile dysfunction. Take two hours prior to intercourse on an empty stomach, Disp: 30 tablet, Rfl: 3   tenofovir (VIREAD) 300 MG tablet, TAKE 1 TABLET BY MOUTH ONCE DAILY, Disp: 90  tablet, Rfl: 3   testosterone cypionate (DEPOTESTOSTERONE CYPIONATE) 200 MG/ML injection, Inject 1 mL (200 mg total) into the muscle every 14 (fourteen) days., Disp: 4 mL, Rfl:  0   traMADol (ULTRAM) 50 MG tablet, Take 50 mg by mouth every 6 (six) hours as needed., Disp: , Rfl:    triamcinolone ointment (KENALOG) 0.1 %, Apply to aa's arms BID up to two weeks. Avoid applying to face, groin, and axilla. Use as directed. Long-term use can cause thinning of the skin., Disp: 30 g, Rfl: 0 No current facility-administered medications for this visit.  Facility-Administered Medications Ordered in Other Visits:    heparin lock flush 100 unit/mL, 500 Units, Intravenous, Once, Creig Hines, MD   heparin lock flush 100 unit/mL, 500 Units, Intravenous, Once, Creig Hines, MD   sodium chloride flush (NS) 0.9 % injection 10 mL, 10 mL, Intravenous, PRN, Creig Hines, MD  Physical exam:  Vitals:   04/09/23 1514  BP: 123/86  Pulse: 93  Resp: 18  Temp: (!) 96.2 F (35.7 C)  TempSrc: Tympanic  SpO2: 98%  Weight: 238 lb (108 kg)  Height: 6\' 2"  (1.88 m)   Physical Exam Constitutional:      General: He is not in acute distress. Cardiovascular:     Rate and Rhythm: Normal rate and regular rhythm.     Heart sounds: Normal heart sounds.  Pulmonary:     Effort: Pulmonary effort is normal.     Breath sounds: Normal breath sounds.  Abdominal:     General: Bowel sounds are normal.     Palpations: Abdomen is soft.     Comments: Splenic tip is palpable  Lymphadenopathy:     Comments: No palpable cervical, supraclavicular, axillary or inguinal adenopathy    Skin:    General: Skin is warm and dry.  Neurological:     Mental Status: He is alert and oriented to person, place, and time.         Latest Ref Rng & Units 04/09/2023    3:04 PM  CMP  Glucose 70 - 99 mg/dL 161   BUN 6 - 20 mg/dL 11   Creatinine 0.96 - 1.24 mg/dL 0.45   Sodium 409 - 811 mmol/L 133   Potassium 3.5 - 5.1 mmol/L 4.2   Chloride 98 - 111 mmol/L 97   CO2 22 - 32 mmol/L 22   Calcium 8.9 - 10.3 mg/dL 8.8   Total Protein 6.5 - 8.1 g/dL 6.7   Total Bilirubin 0.3 - 1.2 mg/dL 0.6   Alkaline Phos 38 -  126 U/L 160   AST 15 - 41 U/L 36   ALT 0 - 44 U/L 27       Latest Ref Rng & Units 04/09/2023    3:04 PM  CBC  WBC 4.0 - 10.5 K/uL 8.3   Hemoglobin 13.0 - 17.0 g/dL 91.4   Hematocrit 78.2 - 52.0 % 44.3   Platelets 150 - 400 K/uL 105     No images are attached to the encounter.  DG Chest 2 View  Result Date: 04/10/2023 CLINICAL DATA:  cough EXAM: CHEST - 2 VIEW COMPARISON:  11/30/2022. FINDINGS: The heart size and mediastinal contours are within normal limits. Numerous lung nodules overlie the lung bases best visualized on the lateral view. No pneumothorax or pleural effusion. Aorta is calcified. There are thoracic degenerative changes. Right-sided Port-A-Cath tip overlies proximal SVC. IMPRESSION: Lung nodules seen on the lateral view. Otherwise no acute cardiopulmonary process. Electronically Signed  By: Layla Maw M.D.   On: 04/10/2023 12:27   CT CHEST ABDOMEN PELVIS W CONTRAST  Result Date: 04/01/2023 CLINICAL DATA:  Grade 1 follicular lymphoma. Post chemotherapy. Assess response to treatment. * Tracking Code: BO *. EXAM: CT CHEST, ABDOMEN, AND PELVIS WITH CONTRAST TECHNIQUE: Multidetector CT imaging of the chest, abdomen and pelvis was performed following the standard protocol during bolus administration of intravenous contrast. RADIATION DOSE REDUCTION: This exam was performed according to the departmental dose-optimization program which includes automated exposure control, adjustment of the mA and/or kV according to patient size and/or use of iterative reconstruction technique. CONTRAST:  OMNIPAQUE IOHEXOL 300 MG/ML  SOLN COMPARISON:  PET-CT scan 12/23/2022 FINDINGS: CT CHEST FINDINGS Cardiovascular: Coronary artery calcification and aortic atherosclerotic calcification. No acute findings. Mediastinum/Nodes: Moderate enlarged mediastinal lymph nodes again noted. Example RIGHT paratracheal node measures 13 mm short axis (image 15/series 2) compared to 11 mm on most recent PET  scan. RIGHT lower paratracheal node measures 10 mm (image 24) compared to 8 mm. Subcarinal node measures 14 mm (image 35) compared to 9 mm. No axillary adenopathy. Lungs/Pleura: Bilateral lower lobe pulmonary nodules again noted. Oblong nodule in the RIGHT lower lobe measures 24 mm (image 123/series 3) compared to 20 mm. LEFT lobe nodule on same image measures 12 mm compared to 14 mm. Peripheral nodule in the RIGHT lower lobe measures 15 mm (image 112) compared to 11 mm. LEFT lower lobe nodule measuring 6 mm (image 72) compares to 5 mm. Musculoskeletal: No aggressive osseous lesion. CT ABDOMEN AND PELVIS FINDINGS Hepatobiliary: No focal hepatic lesion. No biliary ductal dilatation. Gallbladder is normal. Common bile duct is normal. Pancreas: Pancreas is normal. No ductal dilatation. No pancreatic inflammation. Spleen: Spleen is enlarged measuring 14.6 x 7.9 x 17.3 cm (volume equal 1038 cc). This comparable to 12.6 x 6.2 by 14.5 cm ( volume equal 589 cc) on recent FDG PET scan. Adrenals/urinary tract: Adrenal glands and kidneys are normal. The ureters and bladder normal. Stomach/Bowel: Stomach, small bowel, appendix, and cecum are normal. The colon and rectosigmoid colon are normal. Vascular/Lymphatic: Abdominal normal caliber. Again demonstrated periaortic retroperitoneal adenopathy. Lymph nodes are similar to mildly increased in size. Example lymph node LEFT of the aorta beneath the renal artery measures 14 mm compared to 11 mm (image 82/2). Lymph node on the LEFT just above the bifurcation measures 14 mm compared to 8 mm. LEFT common iliac lymph node measures 11 mm (image 101) compared to 11 mm. LEFT external iliac lymph node measures 12 mm (image 114/2) compared to 11 mm. Reproductive: Prostate small Other: No free fluid. Musculoskeletal: No aggressive osseous lesion. IMPRESSION: CHEST IMPRESSION: 1. Mild increase in size of mediastinal lymph nodes. 2. Bilateral lower lobe pulmonary nodules are increased in size.  PELVIS IMPRESSION: 1. Mild increase in size of periaortic retroperitoneal lymph nodes. 2. Splenomegaly increased from prior. 3. No skeletal metastasis. 4. Aortic Atherosclerosis (ICD10-I70.0). Electronically Signed   By: Genevive Bi M.D.   On: 04/01/2023 09:01     Assessment and plan- Patient is a 62 y.o. male with history of stage IV follicular lymphoma here for CT scan discussions  Patient had complete response to treatment after 6 cycles of Bendamustine Rituxan in October 2022.  Subsequently there has been waxing and waning noted especially in his lung nodules.  I have reviewed his present CT chest abdomen and pelvis images independently and discussed findings with the patient which shows continued increase in the size of lung nodules as well as  mild increase in the size of retroperitoneal adenopathy.Spleen has increased in size from 14.5 cm to 17.3 cm with an increase in volume from 589 cc to 1038 cc.  This is concerning for recurrence of his follicular lymphoma.  Although he does not have any obstructive symptoms from his adenopathy I am concerned about his splenomegaly lung nodules as well as worsening thrombocytopenia.  I would recommend getting a repeat PET CT scan and biopsying the most SUV avid site to rule out any large cell transformation although based on his prior PET scan in January 2024 this all appears to be recurrence of his follicular lymphoma.  We could consider a biopsy of the retroperitoneal lymph node and retreatment with Revlimid and obinutuzumab.  I did reach out to Dr. Jayme Cloud from pulmonary to see if we could attempt lung biopsy again.  In the meanwhile I will refer the patient to [facility] for second opinion   Visit Diagnosis 1. 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)   2. Goals of care, counseling/discussion      Dr. Owens Shark, MD, MPH [facility] [phone] 04/11/2023 5:54 PM

## 2023-04-12 ENCOUNTER — Telehealth: Payer: Self-pay | Admitting: *Deleted

## 2023-04-12 NOTE — Telephone Encounter (Signed)
Fax the referral for second opinion to [facility] with Dr. Noralee Stain  fax [phone]. Fax went thrugh

## 2023-04-12 NOTE — Progress Notes (Signed)
Hi Russell Simpson.  No explanation for your symptoms.  Your lung nodules are still present.  Keep following up with Oncology.

## 2023-04-14 DIAGNOSIS — M25671 Stiffness of right ankle, not elsewhere classified: Secondary | ICD-10-CM | POA: Diagnosis not present

## 2023-04-14 DIAGNOSIS — Z0100 Encounter for examination of eyes and vision without abnormal findings: Secondary | ICD-10-CM | POA: Diagnosis not present

## 2023-04-14 DIAGNOSIS — M25571 Pain in right ankle and joints of right foot: Secondary | ICD-10-CM | POA: Diagnosis not present

## 2023-04-20 ENCOUNTER — Encounter: Payer: Self-pay | Admitting: Nurse Practitioner

## 2023-04-26 ENCOUNTER — Inpatient Hospital Stay: Admission: RE | Admit: 2023-04-26 | Payer: Medicare HMO | Source: Ambulatory Visit

## 2023-04-27 ENCOUNTER — Telehealth: Payer: Self-pay | Admitting: *Deleted

## 2023-04-27 ENCOUNTER — Other Ambulatory Visit: Payer: Self-pay | Admitting: Orthopedic Surgery

## 2023-04-27 NOTE — Telephone Encounter (Signed)
Got email that pt was spoke to 5/15. The appt to see MD is 6/6 at [facility]

## 2023-04-28 ENCOUNTER — Encounter
Admission: RE | Admit: 2023-04-28 | Discharge: 2023-04-28 | Disposition: A | Discharge status: Home / Self Care | Payer: Medicare HMO | Source: Ambulatory Visit | Attending: Orthopedic Surgery | Admitting: Orthopedic Surgery

## 2023-04-28 ENCOUNTER — Other Ambulatory Visit: Payer: Self-pay

## 2023-04-28 HISTORY — DX: Gastro-esophageal reflux disease without esophagitis: K21.9

## 2023-04-28 HISTORY — DX: COVID-19: U07.1

## 2023-04-28 HISTORY — DX: Non-Hodgkin lymphoma, unspecified, unspecified site: C85.90

## 2023-04-28 HISTORY — DX: Other specified viral diseases: B33.8

## 2023-04-28 NOTE — Patient Instructions (Addendum)
Your procedure is scheduled on: Tuesday 05/04/23 To find out your arrival time, please call [phone] between 1PM - 3PM on:  Monday 05/03/23  Report to the Registration Desk on the 1st floor of the [facility]. Valet parking is available.  If your arrival time is 6:00 am, do not arrive before that time as the [facility] entrance doors do not open until 6:00 am.  REMEMBER: Instructions that are not followed completely may result in serious medical risk, up to and including death; or upon the discretion of your surgeon and anesthesiologist your surgery may need to be rescheduled.  Do not eat food or drink any liquids after midnight the night before surgery.   One week prior to surgery: Stop Anti-inflammatories (NSAIDS) such as Advil, Aleve, Ibuprofen, Motrin, Naproxen, Naprosyn and Aspirin based products such as Excedrin, Goody's Powder, BC Powder. You may however, continue to take Tylenol if needed for pain up until the day of surgery.  Stop ANY OVER THE COUNTER supplements until after surgery.  Continue taking all prescribed medications.  TAKE ONLY THESE MEDICATIONS THE MORNING OF SURGERY WITH A SIP OF WATER:  buPROPion (WELLBUTRIN SR)  cetirizine (ZYRTEC)  famotidine (PEPCID) Antacid (take one the night before and one on the morning of surgery - helps to prevent nausea after surgery.) metoprolol succinate (TOPROL-XL)  tenofovir (VIREAD   Use inhalers on the day of surgery and bring to the hospital. You may also use your nasal spray.  No Alcohol for 24 hours before or after surgery.  No Smoking including e-cigarettes for 24 hours before surgery.  No chewable tobacco products for at least 6 hours before surgery.  No nicotine patches on the day of surgery.  Do not use any "recreational" drugs for at least a week (preferably 2 weeks) before your surgery.  Please be advised that the combination of cocaine and anesthesia may have negative outcomes, up to and including death. If  you test positive for cocaine, your surgery will be cancelled.  On the morning of surgery brush your teeth with toothpaste and water, you may rinse your mouth with mouthwash if you wish. Do not swallow any toothpaste or mouthwash.  Use CHG Soap or wipes as directed on instruction sheet. This can be picked up at our office at [facility] [address] 1st floor [address] Pre Admit Testing  Do not wear lotions, powders, or perfumes.   Do not shave body hair from the neck down 48 hours before surgery.  Wear comfortable clothing (specific to your surgery type) to the hospital.  Do not wear jewelry, make-up, hairpins, clips or nail polish.  Contact lenses, hearing aids and dentures may not be worn into surgery.  Do not bring valuables to the hospital. [facility] is not responsible for any missing/lost belongings or valuables.   Notify your doctor if there is any change in your medical condition (cold, fever, infection).  If you are being discharged the day of surgery, you will not be allowed to drive home. You will need a responsible individual to drive you home and stay with you for 24 hours after surgery.   If you are taking public transportation, you will need to have a responsible individual with you.  If you are being admitted to the hospital overnight, leave your suitcase in the car. After surgery it may be brought to your room.  In case of increased patient census, it may be necessary for you, the patient, to continue your postoperative care in  the Same Day Surgery department.  After surgery, you can help prevent lung complications by doing breathing exercises.  Take deep breaths and cough every 1-2 hours. Your doctor may order a device called an Incentive Spirometer to help you take deep breaths. When coughing or sneezing, hold a pillow firmly against your incision with both hands. This is called "splinting." Doing this helps protect your incision. It also decreases belly  discomfort.  Surgery Visitation Policy:  Patients undergoing a surgery or procedure may have two family members or support persons with them as long as the person is not COVID-19 positive or experiencing its symptoms.   Inpatient Visitation:    Visiting hours are 7 a.m. to 8 p.m. Up to four visitors are allowed at one time in a patient room. The visitors may rotate out with other people during the day. One designated support person (adult) may remain overnight.  Please call the Pre-admissions Testing Dept. at [phone] if you have any questions about these instructions.     Preparing for Surgery with CHLORHEXIDINE GLUCONATE (CHG) Soap  Chlorhexidine Gluconate (CHG) Soap  o An antiseptic cleaner that kills germs and bonds with the skin to continue killing germs even after washing  o Used for showering the night before surgery and morning of surgery  Before surgery, you can play an important role by reducing the number of germs on your skin.  CHG (Chlorhexidine gluconate) soap is an antiseptic cleanser which kills germs and bonds with the skin to continue killing germs even after washing.  Please do not use if you have an allergy to CHG or antibacterial soaps. If your skin becomes reddened/irritated stop using the CHG.  1. Shower the NIGHT BEFORE SURGERY and the MORNING OF SURGERY with CHG soap.  2. If you choose to wash your hair, wash your hair first as usual with your normal shampoo.  3. After shampooing, rinse your hair and body thoroughly to remove the shampoo.  4. Use CHG as you would any other liquid soap. You can apply CHG directly to the skin and wash gently with a scrungie or a clean washcloth.  5. Apply the CHG soap to your body only from the neck down. Do not use on open wounds or open sores. Avoid contact with your eyes, ears, mouth, and genitals (private parts). Wash face and genitals (private parts) with your normal soap.  6. Wash thoroughly, paying special  attention to the area where your surgery will be performed.  7. Thoroughly rinse your body with warm water.  8. Do not shower/wash with your normal soap after using and rinsing off the CHG soap.  9. Pat yourself dry with a clean towel.  10. Wear clean pajamas to bed the night before surgery.  12. Place clean sheets on your bed the night of your first shower and do not sleep with pets.  13. Shower again with the CHG soap on the day of surgery prior to arriving at the hospital.  14. Do not apply any deodorants/lotions/powders.  15. Please wear clean clothes to the hospital.

## 2023-05-03 ENCOUNTER — Other Ambulatory Visit: Payer: Self-pay | Admitting: Nurse Practitioner

## 2023-05-03 MED ORDER — CHLORHEXIDINE GLUCONATE CLOTH 2 % EX PADS: 6.0000 | MEDICATED_PAD | Freq: Once | CUTANEOUS | Status: DC

## 2023-05-03 MED ORDER — LACTATED RINGERS IV SOLN: INTRAVENOUS | Status: DC

## 2023-05-03 MED ORDER — CLINDAMYCIN PHOSPHATE 900 MG/50ML IV SOLN
900.0000 mg | INTRAVENOUS | Status: AC
  Administered 2023-05-04: 900 mg via INTRAVENOUS

## 2023-05-03 MED ORDER — CHLORHEXIDINE GLUCONATE 0.12 % MT SOLN
15.0000 mL | Freq: Once | OROMUCOSAL | Status: AC
  Administered 2023-05-04: 15 mL via OROMUCOSAL

## 2023-05-03 MED ORDER — ORAL CARE MOUTH RINSE: 15.0000 mL | Freq: Once | OROMUCOSAL | Status: AC

## 2023-05-03 NOTE — Telephone Encounter (Signed)
We don't send his wellbutrin.  He gets it from his psychiatrist.

## 2023-05-03 NOTE — Telephone Encounter (Signed)
Patient came into the office asking for refill for Wellbutrin 150mg . States that [name] has been sending refill request, but no response. Patient states he has been without a couple of days and can tell the difference. Informed I would send a message back to provider [name], NP. Informed patient it may take 48-72 hours to respond.

## 2023-05-04 ENCOUNTER — Ambulatory Visit: Payer: Medicare HMO | Admitting: Nurse Practitioner

## 2023-05-04 ENCOUNTER — Ambulatory Visit
Admission: RE | Admit: 2023-05-04 | Discharge: 2023-05-04 | Disposition: A | Discharge status: Home / Self Care | Payer: Medicare HMO | Attending: Orthopedic Surgery | Admitting: Orthopedic Surgery

## 2023-05-04 ENCOUNTER — Ambulatory Visit: Payer: Medicare HMO | Admitting: Urgent Care

## 2023-05-04 ENCOUNTER — Other Ambulatory Visit: Payer: Self-pay

## 2023-05-04 ENCOUNTER — Encounter: Payer: Self-pay | Admitting: Orthopedic Surgery

## 2023-05-04 ENCOUNTER — Ambulatory Visit: Payer: Medicare HMO | Admitting: Certified Registered"

## 2023-05-04 ENCOUNTER — Encounter
Admission: RE | Disposition: A | Discharge status: Home / Self Care | Payer: Self-pay | Source: Home / Self Care | Attending: Orthopedic Surgery

## 2023-05-04 ENCOUNTER — Ambulatory Visit: Payer: Medicare HMO

## 2023-05-04 DIAGNOSIS — Z8249 Family history of ischemic heart disease and other diseases of the circulatory system: Secondary | ICD-10-CM | POA: Diagnosis not present

## 2023-05-04 DIAGNOSIS — I509 Heart failure, unspecified: Secondary | ICD-10-CM | POA: Insufficient documentation

## 2023-05-04 DIAGNOSIS — Z87891 Personal history of nicotine dependence: Secondary | ICD-10-CM | POA: Diagnosis not present

## 2023-05-04 DIAGNOSIS — Y798 Miscellaneous orthopedic devices associated with adverse incidents, not elsewhere classified: Secondary | ICD-10-CM | POA: Diagnosis not present

## 2023-05-04 DIAGNOSIS — I110 Hypertensive heart disease with heart failure: Secondary | ICD-10-CM | POA: Insufficient documentation

## 2023-05-04 DIAGNOSIS — F419 Anxiety disorder, unspecified: Secondary | ICD-10-CM | POA: Diagnosis not present

## 2023-05-04 DIAGNOSIS — J449 Chronic obstructive pulmonary disease, unspecified: Secondary | ICD-10-CM | POA: Insufficient documentation

## 2023-05-04 DIAGNOSIS — Z79899 Other long term (current) drug therapy: Secondary | ICD-10-CM | POA: Insufficient documentation

## 2023-05-04 DIAGNOSIS — Z5986 Financial insecurity: Secondary | ICD-10-CM | POA: Insufficient documentation

## 2023-05-04 DIAGNOSIS — I5032 Chronic diastolic (congestive) heart failure: Secondary | ICD-10-CM | POA: Diagnosis not present

## 2023-05-04 DIAGNOSIS — K219 Gastro-esophageal reflux disease without esophagitis: Secondary | ICD-10-CM | POA: Diagnosis not present

## 2023-05-04 DIAGNOSIS — G4730 Sleep apnea, unspecified: Secondary | ICD-10-CM | POA: Diagnosis not present

## 2023-05-04 DIAGNOSIS — S82891D Other fracture of right lower leg, subsequent encounter for closed fracture with routine healing: Secondary | ICD-10-CM

## 2023-05-04 DIAGNOSIS — F319 Bipolar disorder, unspecified: Secondary | ICD-10-CM | POA: Diagnosis not present

## 2023-05-04 DIAGNOSIS — T8484XA Pain due to internal orthopedic prosthetic devices, implants and grafts, initial encounter: Secondary | ICD-10-CM | POA: Insufficient documentation

## 2023-05-04 DIAGNOSIS — Z6830 Body mass index (BMI) 30.0-30.9, adult: Secondary | ICD-10-CM | POA: Diagnosis not present

## 2023-05-04 DIAGNOSIS — S8261XS Displaced fracture of lateral malleolus of right fibula, sequela: Secondary | ICD-10-CM | POA: Diagnosis not present

## 2023-05-04 HISTORY — PX: HARDWARE REMOVAL: SHX979

## 2023-05-04 SURGERY — REMOVAL, HARDWARE
Anesthesia: General | Site: Ankle | Laterality: Right

## 2023-05-04 MED ORDER — BUPIVACAINE HCL (PF) 0.5 % IJ SOLN
INTRAMUSCULAR | Status: AC
  Filled 2023-05-04: qty 30

## 2023-05-04 MED ORDER — EPHEDRINE SULFATE (PRESSORS) 50 MG/ML IJ SOLN
INTRAMUSCULAR | Status: DC | PRN
  Administered 2023-05-04: 10 mg via INTRAVENOUS

## 2023-05-04 MED ORDER — ONDANSETRON HCL 4 MG/2ML IJ SOLN
INTRAMUSCULAR | Status: DC | PRN
  Administered 2023-05-04: 4 mg via INTRAVENOUS

## 2023-05-04 MED ORDER — MIDAZOLAM HCL 2 MG/2ML IJ SOLN
INTRAMUSCULAR | Status: DC | PRN
  Administered 2023-05-04: 2 mg via INTRAVENOUS

## 2023-05-04 MED ORDER — NEOMYCIN-POLYMYXIN B GU 40-200000 IR SOLN
Status: AC
  Filled 2023-05-04: qty 20

## 2023-05-04 MED ORDER — ACETAMINOPHEN 10 MG/ML IV SOLN
INTRAVENOUS | Status: AC
  Filled 2023-05-04: qty 100

## 2023-05-04 MED ORDER — GLYCOPYRROLATE 0.2 MG/ML IJ SOLN
INTRAMUSCULAR | Status: AC
  Filled 2023-05-04: qty 1

## 2023-05-04 MED ORDER — LIDOCAINE HCL (PF) 2 % IJ SOLN
INTRAMUSCULAR | Status: AC
  Filled 2023-05-04: qty 5

## 2023-05-04 MED ORDER — ACETAMINOPHEN 10 MG/ML IV SOLN
INTRAVENOUS | Status: DC | PRN
  Administered 2023-05-04: 1000 mg via INTRAVENOUS

## 2023-05-04 MED ORDER — KETAMINE HCL 50 MG/5ML IJ SOSY
PREFILLED_SYRINGE | INTRAMUSCULAR | Status: AC
  Filled 2023-05-04: qty 5

## 2023-05-04 MED ORDER — CHLORHEXIDINE GLUCONATE 0.12 % MT SOLN
OROMUCOSAL | Status: AC
  Filled 2023-05-04: qty 15

## 2023-05-04 MED ORDER — OXYCODONE HCL 5 MG PO TABS: 5.0000 mg | ORAL_TABLET | Freq: Once | ORAL | Status: DC | PRN

## 2023-05-04 MED ORDER — CLINDAMYCIN PHOSPHATE 900 MG/50ML IV SOLN
INTRAVENOUS | Status: AC
  Filled 2023-05-04: qty 50

## 2023-05-04 MED ORDER — 0.9 % SODIUM CHLORIDE (POUR BTL) OPTIME
TOPICAL | Status: DC | PRN
  Administered 2023-05-04: 500 mL

## 2023-05-04 MED ORDER — NEOMYCIN-POLYMYXIN B GU 40-200000 IR SOLN
Status: DC | PRN
  Administered 2023-05-04: 2 mL

## 2023-05-04 MED ORDER — PHENYLEPHRINE 80 MCG/ML (10ML) SYRINGE FOR IV PUSH (FOR BLOOD PRESSURE SUPPORT)
PREFILLED_SYRINGE | INTRAVENOUS | Status: AC
  Filled 2023-05-04: qty 10

## 2023-05-04 MED ORDER — MIDAZOLAM HCL 2 MG/2ML IJ SOLN
INTRAMUSCULAR | Status: AC
  Filled 2023-05-04: qty 2

## 2023-05-04 MED ORDER — PROPOFOL 10 MG/ML IV BOLUS
INTRAVENOUS | Status: AC
  Filled 2023-05-04: qty 20

## 2023-05-04 MED ORDER — TRAMADOL HCL 50 MG PO TABS: 50.0000 mg | ORAL_TABLET | Freq: Four times a day (QID) | ORAL | 0 refills | Status: DC | PRN

## 2023-05-04 MED ORDER — OXYCODONE HCL 5 MG/5ML PO SOLN: 5.0000 mg | Freq: Once | ORAL | Status: DC | PRN

## 2023-05-04 MED ORDER — KETOROLAC TROMETHAMINE 30 MG/ML IJ SOLN
INTRAMUSCULAR | Status: DC | PRN
  Administered 2023-05-04: 30 mg via INTRAVENOUS

## 2023-05-04 MED ORDER — FENTANYL CITRATE (PF) 100 MCG/2ML IJ SOLN
INTRAMUSCULAR | Status: AC
  Filled 2023-05-04: qty 2

## 2023-05-04 MED ORDER — KETAMINE HCL 10 MG/ML IJ SOLN
INTRAMUSCULAR | Status: DC | PRN
  Administered 2023-05-04: 30 mg via INTRAVENOUS

## 2023-05-04 MED ORDER — FENTANYL CITRATE (PF) 100 MCG/2ML IJ SOLN
INTRAMUSCULAR | Status: DC | PRN
  Administered 2023-05-04 (×2): 50 ug via INTRAVENOUS

## 2023-05-04 MED ORDER — FENTANYL CITRATE (PF) 100 MCG/2ML IJ SOLN: 25.0000 ug | INTRAMUSCULAR | Status: DC | PRN

## 2023-05-04 MED ORDER — PROPOFOL 10 MG/ML IV BOLUS
INTRAVENOUS | Status: DC | PRN
  Administered 2023-05-04: 200 mg via INTRAVENOUS

## 2023-05-04 MED ORDER — LIDOCAINE HCL (CARDIAC) PF 100 MG/5ML IV SOSY
PREFILLED_SYRINGE | INTRAVENOUS | Status: DC | PRN
  Administered 2023-05-04: 100 mg via INTRAVENOUS

## 2023-05-04 MED ORDER — LACTATED RINGERS IV SOLN: INTRAVENOUS | Status: DC | PRN

## 2023-05-04 MED ORDER — ONDANSETRON HCL 4 MG/2ML IJ SOLN
INTRAMUSCULAR | Status: AC
  Filled 2023-05-04: qty 2

## 2023-05-04 MED ORDER — BUPIVACAINE HCL 0.5 % IJ SOLN
INTRAMUSCULAR | Status: DC | PRN
  Administered 2023-05-04: 20 mL

## 2023-05-04 MED ORDER — EPHEDRINE 5 MG/ML INJ
INTRAVENOUS | Status: AC
  Filled 2023-05-04: qty 5

## 2023-05-04 MED ORDER — DEXAMETHASONE SODIUM PHOSPHATE 10 MG/ML IJ SOLN
INTRAMUSCULAR | Status: DC | PRN
  Administered 2023-05-04: 5 mg via INTRAVENOUS

## 2023-05-04 MED ORDER — PHENYLEPHRINE HCL (PRESSORS) 10 MG/ML IV SOLN
INTRAVENOUS | Status: DC | PRN
  Administered 2023-05-04 (×3): 80 ug via INTRAVENOUS

## 2023-05-04 MED ORDER — GLYCOPYRROLATE 0.2 MG/ML IJ SOLN
INTRAMUSCULAR | Status: DC | PRN
  Administered 2023-05-04: .1 mg via INTRAVENOUS

## 2023-05-04 MED ORDER — KETOROLAC TROMETHAMINE 30 MG/ML IJ SOLN
INTRAMUSCULAR | Status: AC
  Filled 2023-05-04: qty 1

## 2023-05-04 SURGICAL SUPPLY — 43 items
BLADE SURG 15 STRL LF DISP TIS (BLADE) ×2 IMPLANT
BLADE SURG 15 STRL SS (BLADE) ×1
BNDG CMPR 5X4 CHSV STRCH STRL (GAUZE/BANDAGES/DRESSINGS)
BNDG COHESIVE 4X5 TAN STRL LF (GAUZE/BANDAGES/DRESSINGS) IMPLANT
BNDG ESMARCH 6 X 12 STRL LF (GAUZE/BANDAGES/DRESSINGS) ×1
BNDG ESMARCH 6X12 STRL LF (GAUZE/BANDAGES/DRESSINGS) ×2 IMPLANT
CUFF TOURN SGL QUICK 18X4 (TOURNIQUET CUFF) IMPLANT
CUFF TOURN SGL QUICK 24 (TOURNIQUET CUFF)
CUFF TRNQT CYL 24X4X16.5-23 (TOURNIQUET CUFF) IMPLANT
DRAPE FLUOR MINI C-ARM 54X84 (DRAPES) ×2 IMPLANT
DRAPE INCISE IOBAN 66X45 STRL (DRAPES) ×2 IMPLANT
DRAPE U-SHAPE 47X51 STRL (DRAPES) ×2 IMPLANT
DURAPREP 26ML APPLICATOR (WOUND CARE) ×2 IMPLANT
ELECT REM PT RETURN 9FT ADLT (ELECTROSURGICAL) ×1
ELECTRODE REM PT RTRN 9FT ADLT (ELECTROSURGICAL) ×2 IMPLANT
GAUZE SPONGE 4X4 12PLY STRL (GAUZE/BANDAGES/DRESSINGS) ×2 IMPLANT
GAUZE XEROFORM 1X8 LF (GAUZE/BANDAGES/DRESSINGS) ×2 IMPLANT
GLOVE BIOGEL PI IND STRL 9 (GLOVE) ×2 IMPLANT
GLOVE BIOGEL PI ORTHO SZ9 (GLOVE) ×4 IMPLANT
GOWN STRL REUS TWL 2XL XL LVL4 (GOWN DISPOSABLE) ×2 IMPLANT
GOWN STRL REUS W/ TWL LRG LVL3 (GOWN DISPOSABLE) ×2 IMPLANT
GOWN STRL REUS W/TWL LRG LVL3 (GOWN DISPOSABLE) ×1
IMPL TIGHTROP W/DRV K-LESS (Anchor) IMPLANT
IMPLANT TIGHTROPE W/DRV K-LESS (Anchor) ×1 IMPLANT
KIT TURNOVER KIT A (KITS) ×2 IMPLANT
LABEL OR SOLS (LABEL) ×2 IMPLANT
MANIFOLD NEPTUNE II (INSTRUMENTS) ×2 IMPLANT
NS IRRIG 1000ML POUR BTL (IV SOLUTION) ×2 IMPLANT
PACK EXTREMITY ARMC (MISCELLANEOUS) ×2 IMPLANT
PAD CAST 4YDX4 CTTN HI CHSV (CAST SUPPLIES) ×2 IMPLANT
PADDING CAST COTTON 4X4 STRL (CAST SUPPLIES) ×1
SPLINT CAST 1 STEP 4X30 (MISCELLANEOUS) ×2 IMPLANT
SPONGE T-LAP 18X18 ~~LOC~~+RFID (SPONGE) ×2 IMPLANT
STAPLER SKIN PROX 35W (STAPLE) ×2 IMPLANT
STOCKINETTE IMPERVIOUS 9X36 MD (GAUZE/BANDAGES/DRESSINGS) IMPLANT
STOCKINETTE STRL 6IN 960660 (GAUZE/BANDAGES/DRESSINGS) ×2 IMPLANT
STRIP CLOSURE SKIN 1/2X4 (GAUZE/BANDAGES/DRESSINGS) ×2 IMPLANT
SUT VIC AB 2-0 SH 27 (SUTURE) ×1
SUT VIC AB 2-0 SH 27XBRD (SUTURE) ×2 IMPLANT
SYR 30ML LL (SYRINGE) ×2 IMPLANT
TAPE PAPER 2X10 WHT MICROPORE (GAUZE/BANDAGES/DRESSINGS) ×2 IMPLANT
TRAP FLUID SMOKE EVACUATOR (MISCELLANEOUS) ×2 IMPLANT
WATER STERILE IRR 500ML POUR (IV SOLUTION) ×2 IMPLANT

## 2023-05-04 NOTE — Anesthesia Procedure Notes (Signed)
Procedure Name: LMA Insertion Date/Time: 05/04/2023 1:50 PM  Performed by: Morene Crocker, CRNAPre-anesthesia Checklist: Patient identified, Patient being monitored, Timeout performed, Emergency Drugs available and Suction available Patient Re-evaluated:Patient Re-evaluated prior to induction Oxygen Delivery Method: Circle system utilized Preoxygenation: Pre-oxygenation with 100% oxygen Induction Type: IV induction Ventilation: Mask ventilation without difficulty LMA: LMA inserted LMA Size: 4.0 Tube type: Oral Number of attempts: 1 Placement Confirmation: positive ETCO2 and breath sounds checked- equal and bilateral Tube secured with: Tape Dental Injury: Teeth and Oropharynx as per pre-operative assessment  Comments: Smooth, atraumatic LMA placement. No complications noted

## 2023-05-04 NOTE — Op Note (Signed)
05/04/2023  2:49 PM  PATIENT:  Russell Simpson    PRE-OPERATIVE DIAGNOSIS:  Right Ankle Syndesmotic screw  POST-OPERATIVE DIAGNOSIS:  Same  PROCEDURE:  RIGHT ANKLE SYNDESMOTIC SCREW REMOVAL AND PLACEMENT OF ANKLE TIGHTROPE WIRE  SURGEON:  Juanell Fairly, MD  ANESTHESIA:   General  PREOPERATIVE INDICATIONS:  Russell Simpson is a  62 y.o. male with a diagnosis of Right Ankle Painful Hardware who failed conservative measures and elected for surgical management.    I discussed the risks and benefits of surgery. The risks include but are not limited to infection, bleeding requiring blood transfusion, nerve or blood vessel injury, joint stiffness or loss of motion, persistent pain, weakness or instability, malunion, nonunion and hardware failure and the need for further surgery. Medical risks include but are not limited to DVT and pulmonary embolism, myocardial infarction, stroke, pneumonia, respiratory failure and death. Patient understood these risks and wished to proceed.    OPERATIVE IMPLANTS: Arthrex ankle stainless steel tight rope  OPERATIVE FINDINGS: No syndesmotic disruption or instability.  Lateral malleolus fracture appears well-healed.  OPERATIVE PROCEDURE: Patient was met in the preoperative area.  The right ankle was marked according to hospital's correct site of surgery protocol.  A preop history and physical was performed at the bedside.  Patient was brought to the operating room where he was placed supine on the operative table and underwent general anesthesia.  Tourniquet was applied to the right thigh.  Patient was prepped and draped in a sterile fashion.  A timeout was performed to verify the patient's name, date of birth, medical record number, correct site of surgery and correct procedure to be performed.  Once all in attendance were in agreement case began.  A FluoroScan was used to identify the level of the syndesmotic screw.  The screw head was palpable through the skin.   An incision was made over the screw head.  A Bovie was used to expose the screw head.  A screwdriver was used to back out the syndesmotic screw.  The drill for the Arthrex tight rope was then advanced through the same screw hole that the syndesmotic screw had been removed from.  An Arthrex stainless steel tight rope was then placed through the drill hole.  The button was flipped on the cortex of the medial tibia.  FluoroScan imaging was used to confirm that the button was flipped medially.  Counter traction was applied and the lateral button was advanced into position along the one third tubular plate providing support to the syndesmosis.  FluoroScan imaging was used to confirm the tight rope was well-positioned.  The FluoroScan also showed the mortise was symmetric.    Both incisions were copiously irrigated.  The 2 small incisions were closed with 4-0 nylon.  Steri-Strips were applied.  Patient had both incisions injected with half percent Marcaine plain.  The Marcaine was also injected intra-articularly for postop pain relief.  A dry sterile dressing was applied.  The patient was awoken and brought to PACU in stable condition.  I was scrubbed and present for the entire case.  All sharp, instrument and sponge counts were correct at the conclusion of the case.

## 2023-05-04 NOTE — Discharge Instructions (Signed)

## 2023-05-04 NOTE — H&P (Signed)
PREOPERATIVE H&P  Chief Complaint: Right Ankle Painful Hardware  HPI: Russell Simpson is a 62 y.o. male who presents for preoperative history and physical for removal of syndesmotic screw after undergoing ORIF of a right ankle fracture with syndesmotic fixation on 12/02/2022.  Patient syndesmotic screw has been prophylactically removed to avoid breakage.    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HCC)    Arthritis    Asthma    Basal cell carcinoma 01/28/2021   R upper arm, EDC 03/04/2021   Basal cell carcinoma 12/31/2022   Right temporal hair line. Nodular. 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   left upper back ED&C done   Hypertension    Hyponatremia    Inappropriate sinus tachycardia    Non Hodgkin's lymphoma (HCC)    Pneumonia    RSV (respiratory syncytial virus infection)    SCC (squamous cell carcinoma) 08/28/2021   upper chest right of midline, EDC done 09/17/2021   SCC (squamous cell carcinoma) 09/30/2022   left chest  ED&C done   Sleep apnea    does not wear CPAP ; uses humidifier instead   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   right upper back, ED&C 06/18/2022   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   right upper back ED&C done   Squamous cell carcinoma of skin 02/19/2021   L inferior mandible, treated with EDC    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/28/2021   R ant shoulder - ED&C   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/28/2021   L upper abdomen - ED&C   Past Surgical History:  Procedure Laterality Date   ANTERIOR CERVICAL DECOMP/DISCECTOMY FUSION N/A 06/17/2016   Procedure: ANTERIOR CERVICAL DECOMPRESSION FUSION, CERVICAL 3-4, CERVICAL 4-5 WITH INSTRUMENTATION AND ALLOGRAFT;  Surgeon: Estill Bamberg, MD;  Location: [facility] OR;  Service: Orthopedics;  Laterality: N/A;  ANTERIOR CERVICAL DECOMPRESSION FUSION, CERVICAL 3-4, CERVICAL 4-5 WITH INSTRUMENTATION AND ALLOGRAFT   BACK SURGERY     x3   NECK SURGERY  12/01/2007   ORIF ANKLE FRACTURE Right 12/02/2022   Procedure: OPEN REDUCTION INTERNAL FIXATION (ORIF) ANKLE FRACTURE;  Surgeon: Juanell Fairly, MD;  Location: [facility] ORS;  Service: Orthopedics;  Laterality: Right;   ORIF ANKLE FRACTURE Right 12/01/2022   Procedure: OPEN REDUCTION INTERNAL FIXATION (ORIF) ANKLE FRACTURE;  Surgeon: Juanell Fairly, MD;  Location: [facility] ORS;  Service: Orthopedics;  Laterality: Right;   PORTA CATH INSERTION N/A 04/03/2021   Procedure: PORTA CATH INSERTION;  Surgeon: Annice Needy, MD;  Location: [facility];  Service: Cardiovascular;  Laterality: N/A;   VIDEO BRONCHOSCOPY WITH ENDOBRONCHIAL ULTRASOUND Bilateral 10/14/2022   Procedure: VIDEO BRONCHOSCOPY WITH ENDOBRONCHIAL ULTRASOUND;  Surgeon: Salena Saner, MD;  Location: [facility] ORS;  Service: Pulmonary;  Laterality: Bilateral;   Social History   Socioeconomic History   Marital status: Divorced    Spouse name: Not on file   Number of children: Not on file   Years of education: Not on file   Highest education level: Not on file  Occupational History   Not on file  Tobacco Use   Smoking status: Former    Types: Cigars    Quit date: 11/30/2022    Years since quitting: 0.4   Smokeless tobacco: Never  Vaping Use   Vaping Use: Former   Start date: 04/30/2017   Quit date: 11/30/2017  Substance and Sexual Activity    Alcohol use: Yes    Comment: beers 6 a day   Drug use: No   Sexual activity: Not Currently  Other Topics Concern   Not on file  Social History Narrative   Not on file   Social Determinants of Health   Financial Resource Strain: Medium Risk (04/05/2023)   Overall Financial Resource Strain (CARDIA)    Difficulty of Paying Living Expenses: Somewhat hard  Food Insecurity: No Food Insecurity (04/05/2023)   Hunger Vital Sign    Worried About Running Out of Food in the Last Year: Never true    Ran Out of Food in the Last Year: Never true  Transportation Needs: No Transportation Needs (04/05/2023)   PRAPARE - Administrator, Civil Service (Medical): No    Lack of Transportation (Non-Medical): No  Physical Activity: Inactive (01/01/2023)   Exercise Vital Sign    Days of Exercise per Week: 0 days    Minutes of Exercise per Session: 0 min  Stress: No Stress Concern Present (04/05/2023)   [name] of Occupational Health - Occupational Stress Questionnaire    Feeling of Stress : Only a little  Social Connections: Socially Isolated (04/05/2023)   Social Connection and Isolation Panel [NHANES]    Frequency of Communication with Friends and Family: More than three times a week    Frequency of Social Gatherings with Friends and Family: Twice a week    Attends Religious Services: Never    Database administrator or Organizations: No    Attends Engineer, structural: Never    Marital Status: Divorced   Family History  Problem Relation Age of Onset   Diabetes Mother    Heart attack Father    Emphysema Father    Diabetes Other    Allergies  Allergen Reactions   Penicillins Anaphylaxis    Tolerates cefdinir, cephalexin and amoxicillin/clavulonate so true PCN allergy unlikely   Tetanus Toxoids Swelling   Lisinopril Cough   Losartan      Other reaction(s): Muscle Pain     Codeine Nausea Only and Nausea And Vomiting   Doxycycline Rash   Ruxience [Rituximab-Pvvr]  Rash    05/06/21 pt w/ worsening rash during Ruxience infusion.  Face, neck and chest flushing redness   Prior to Admission medications   Medication Sig Start Date End Date Taking? Authorizing Provider  albuterol (VENTOLIN HFA) 108 (90 Base) MCG/ACT inhaler Inhale 2 puffs into the lungs every 6 (six) hours as needed for wheezing or shortness of breath. 01/28/23  Yes Salena Saner, MD  atorvastatin (LIPITOR) 80 MG tablet Take 1 tablet (80 mg total) by mouth daily. Patient taking differently: Take 80 mg by mouth every evening. 06/26/22 06/21/23 Yes Agbor-Etang, Arlys John, MD  buPROPion [facility] SR) 150 MG 12 hr tablet Take 150 mg by mouth 2 (two) times daily. 11/03/22  Yes [provider]  calcipotriene (DOVONOX) 0.005 % cream Apply topically 2 (two) times daily. Apply twice a day after fluorouracil to affected areas as directed in handout. 05/27/22  Yes Moye, [place], MD  cetirizine (ZYRTEC) 10 MG tablet Take 1 tablet (10 mg total) by mouth daily. 03/08/23  Yes [name], NP  DULoxetine (CYMBALTA) 60 MG capsule Take 60 mg by mouth every evening. 07/01/22  Yes [provider]  famotidine (PEPCID) 20 MG tablet Take 1 tablet (20 mg total) by mouth 2 (two) times daily. 03/29/23  Yes [name], NP  finasteride (PROSCAR) 5 MG tablet Take 1 tablet (5 mg total) by mouth every evening. 12/31/22  Yes McGowan, Carollee Herter A, PA-C  fluticasone (FLONASE) 50 MCG/ACT nasal spray Place 2 sprays into both nostrils daily. 03/08/23  Yes [name], NP  metoprolol succinate (TOPROL-XL) 100 MG 24 hr tablet TAKE 1 TABLET BY MOUTH ONCE DAILY WITH OR IMMEDIATELY FOLLOWING A MEAL 03/29/23  Yes [name], NP  mirtazapine (REMERON) 15 MG tablet Take 15 mg by mouth at bedtime. 06/30/22  Yes [provider]  montelukast (SINGULAIR) 10 MG tablet Take 10 mg by mouth daily. 11/27/22  Yes [provider]  naltrexone (DEPADE) 50 MG tablet Take 50 mg by mouth daily.   Yes [provider]  OLANZapine (ZYPREXA) 5 MG tablet Take 5 mg by mouth at bedtime. 01/28/22  Yes [provider]  sildenafil (VIAGRA) 100 MG tablet Take 1 tablet (100 mg total) by mouth daily as needed for erectile dysfunction. Take two hours prior to intercourse on an empty stomach 03/26/23  Yes McGowan, Carollee Herter A, PA-C  tenofovir (VIREAD) 300 MG tablet TAKE 1 TABLET BY MOUTH ONCE DAILY 12/31/22  Yes Wyline Mood, MD  testosterone cypionate (DEPOTESTOSTERONE CYPIONATE) 200 MG/ML injection Inject 1 mL (200 mg total) into the muscle every 14 (fourteen) days. 03/26/23  Yes McGowan, Carollee Herter A, PA-C  traMADol (ULTRAM) 50 MG tablet Take 50 mg by mouth every 6 (six) hours as needed. 12/22/22  Yes [provider]  triamcinolone ointment (KENALOG) 0.1 % Apply to aa's arms BID up to two weeks. Avoid applying to face, groin, and axilla. Use as directed. Long-term use can cause thinning of the skin. 04/08/23  Yes Moye, [place], MD  Blood Glucose Monitoring Suppl (ONE TOUCH ULTRA 2) w/Device KIT 1 each by Does not apply route daily. 01/01/23   [name], NP  clonazePAM (KLONOPIN) 0.5 MG tablet Take 0.5 mg by mouth every evening.    Zorita Pang, PA-C  Fluticasone-Umeclidin-Vilant (TRELEGY ELLIPTA) 200-62.5-25 MCG/ACT AEPB Inhale 1 puff into the lungs daily. Patient not taking: Reported on 04/28/2023 01/28/23   Salena Saner, MD  glucose blood (ONETOUCH ULTRA) test strip 1 each by Other route daily. Use as instructed 01/01/23   [name], NP  Lancets [facility] ULTRASOFT) lancets 1 each by Other route daily. Use as instructed 01/01/23   [name], NP     Positive ROS: All other systems have been reviewed and were otherwise negative with the exception of those mentioned in the HPI and as above.  Physical Exam: General: Alert, no acute distress Cardiovascular: Regular rate and rhythm, no murmurs rubs or gallops.  No pedal edema Respiratory: Clear to auscultation bilaterally, no  wheezes rales or rhonchi. No cyanosis, no use of accessory musculature GI: No organomegaly, abdomen is soft and non-tender nondistended with positive bowel sounds. Skin: Skin intact, no lesions within the operative field. Neurologic: Sensation intact distally Psychiatric: Patient  is competent for consent with normal mood and affect Lymphatic: No cervical lymphadenopathy  MUSCULOSKELETAL: Right ankle: Patient's incisions are well-healed.  There is no erythema ecchymosis or swelling.  He has palpable pedal pulses, intact sensation light touch intact motor function distally.  Assessment: Right Ankle syndesmotic screw removal  Plan: Plan for Procedure(s): Open syndesmotic screw removal and replacement with ankle tight rope  Reviewed the details of the operation as well as the postoperative course with the patient.  I answered all his questions.  Preop history and physical was performed at the bedside.  The right ankle was marked according to hospital's correct site of surgery protocol.  I discussed the risks and benefits of surgery. The risks include but are not limited to infection, bleeding, nerve or blood vessel injury, joint stiffness or loss of motion, persistent pain, weakness or instability, fracture, ankle instability hardware failure and the need for further surgery. Medical risks include but are not limited to DVT and pulmonary embolism, myocardial infarction, stroke, pneumonia, respiratory failure and death. Patient understood these risks and wished to proceed.     Juanell Fairly, MD   05/04/2023 1:40 PM

## 2023-05-04 NOTE — Anesthesia Preprocedure Evaluation (Signed)
Anesthesia Evaluation  Patient identified by MRN, date of birth, ID band Patient awake    Reviewed: Allergy & Precautions, NPO status , Patient's Chart, lab work & pertinent test results  History of Anesthesia Complications Negative for: history of anesthetic complications  Airway Mallampati: III  TM Distance: >3 FB Neck ROM: full    Dental  (+) Teeth Intact   Pulmonary shortness of breath, sleep apnea , COPD, Recent URI , Residual Cough, Patient abstained from smoking., former smoker   Pulmonary exam normal  + decreased breath sounds      Cardiovascular Exercise Tolerance: Poor hypertension, Pt. on medications +CHF  Normal cardiovascular exam Rhythm:Regular  Grade I diastolic dysfunction   Neuro/Psych  PSYCHIATRIC DISORDERS Anxiety Depression Bipolar Disorder   negative neurological ROS     GI/Hepatic ,GERD  ,,(+)     substance abuse  alcohol use  Endo/Other  negative endocrine ROS  Morbid obesity  Renal/GU negative Renal ROS     Musculoskeletal   Abdominal  (+) + obese  Peds negative pediatric ROS (+)  Hematology negative hematology ROS (+)   Anesthesia Other Findings Past Medical History: No date: 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HCC) No date: Arthritis No date: Asthma 01/28/2021: Basal cell carcinoma     Comment:  R upper arm, EDC 03/04/2021 No date: Brain tumor (benign) (HCC)     Comment:  benign pituitary neoplasm No date: Chronic pain     Comment:  right arm No date: COPD (chronic obstructive pulmonary disease) (HCC) No date: Depression No date: Dyspnea No date: Elevated liver enzymes No date: Environmental and seasonal allergies 2023: Femur fracture, left (HCC) 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) No date: History of methicillin resistant staphylococcus aureus (MRSA)     Comment:  years ago 05/29/2021: History of SCC (squamous cell carcinoma) of skin     Comment:  left neck, Moh's 05/29/21 No date:  History of SCC (squamous cell carcinoma) of skin 09/30/2022: History of squamous cell carcinoma in situ (SCCIS)     Comment:  left upper back ED&C done No date: Hypertension No date: Hyponatremia No date: Inappropriate sinus tachycardia No date: Pneumonia 08/28/2021: SCC (squamous cell carcinoma)     Comment:  upper chest right of midline, EDC done 09/17/2021 09/30/2022: SCC (squamous cell carcinoma)     Comment:  left chest  ED&C done No date: Sleep apnea     Comment:  does not wear CPAP ; uses humidifier instead 05/27/2022: Squamous cell carcinoma in situ (SCCIS)     Comment:  right upper back, ED&C 06/18/2022 09/30/2022: Squamous cell carcinoma in situ (SCCIS)     Comment:  right upper back ED&C done 02/19/2021: Squamous cell carcinoma of skin     Comment:  L inferior mandible, treated with [facility] 08/28/2021: Squamous cell carcinoma of skin     Comment:  R ant shoulder - ED&C 08/28/2021: Squamous cell carcinoma of skin     Comment:  L upper abdomen - ED&C  Past Surgical History: 06/17/2016: ANTERIOR CERVICAL DECOMP/DISCECTOMY FUSION; N/A     Comment:  Procedure: ANTERIOR CERVICAL DECOMPRESSION FUSION,               CERVICAL 3-4, CERVICAL 4-5 WITH INSTRUMENTATION AND               ALLOGRAFT;  Surgeon: Estill Bamberg, MD;  Location: [facility] OR;              Service: Orthopedics;  Laterality: N/A;  ANTERIOR  CERVICAL DECOMPRESSION FUSION, CERVICAL 3-4, CERVICAL 4-5              WITH INSTRUMENTATION AND ALLOGRAFT No date: BACK SURGERY     Comment:  x3 12/01/2007: NECK SURGERY 04/03/2021: PORTA CATH INSERTION; N/A     Comment:  Procedure: PORTA CATH INSERTION;  Surgeon: Annice Needy,              MD;  Location: [facility] INVASIVE CV LAB;  Service:               Cardiovascular;  Laterality: N/A; 10/14/2022: VIDEO BRONCHOSCOPY WITH ENDOBRONCHIAL ULTRASOUND;  Bilateral     Comment:  Procedure: VIDEO BRONCHOSCOPY WITH ENDOBRONCHIAL               ULTRASOUND;  Surgeon: Salena Saner, MD;  Location:               [facility] ORS;  Service: Pulmonary;  Laterality: Bilateral;  BMI    Body Mass Index: 30.56 kg/m      Reproductive/Obstetrics negative OB ROS                             Anesthesia Physical Anesthesia Plan  ASA: 3  Anesthesia Plan: General   Post-op Pain Management: Toradol IV (intra-op)* and Ofirmev IV (intra-op)*   Induction: Intravenous  PONV Risk Score and Plan: 2 and Ondansetron, Dexamethasone, Midazolam and Treatment may vary due to age or medical condition  Airway Management Planned: LMA  Additional Equipment: None  Intra-op Plan:   Post-operative Plan: Extubation in OR  Informed Consent: I have reviewed the patients History and Physical, chart, labs and discussed the procedure including the risks, benefits and alternatives for the proposed anesthesia with the patient or authorized representative who has indicated his/her understanding and acceptance.     Dental Advisory Given  Plan Discussed with: CRNA and Surgeon  Anesthesia Plan Comments: (Patient consented for risks of anesthesia including but not limited to:  - adverse reactions to medications - damage to eyes, teeth, lips or other oral mucosa - nerve damage due to positioning  - sore throat or hoarseness - Damage to heart, brain, nerves, lungs, other parts of body or loss of life  Patient voiced understanding.)       Anesthesia Quick Evaluation

## 2023-05-04 NOTE — Anesthesia Postprocedure Evaluation (Signed)
Anesthesia Post Note  Patient: Isaish Jackovich  Procedure(s) Performed: HARDWARE REMOVAL (Right: Ankle)  Patient location during evaluation: PACU Anesthesia Type: General Level of consciousness: awake and alert Pain management: pain level controlled Vital Signs Assessment: post-procedure vital signs reviewed and stable Respiratory status: spontaneous breathing, nonlabored ventilation, respiratory function stable and patient connected to nasal cannula oxygen Cardiovascular status: blood pressure returned to baseline and stable Postop Assessment: no apparent nausea or vomiting Anesthetic complications: no   No notable events documented.   Last Vitals:  Vitals:   05/04/23 1216 05/04/23 1438  BP: 123/83 99/68  Pulse: (!) 107 93  Resp: 16 11  Temp: (!) 36.1 C 36.6 C  SpO2: 95% 99%    Last Pain:  Vitals:   05/04/23 1438  TempSrc:   PainSc: 0-No pain                 Louie Boston

## 2023-05-04 NOTE — Transfer of Care (Signed)
Immediate Anesthesia Transfer of Care Note  Patient: Russell Simpson  Procedure(s) Performed: HARDWARE REMOVAL (Right: Ankle)  Patient Location: PACU  Anesthesia Type:General  Level of Consciousness: awake, alert , and oriented  Airway & Oxygen Therapy: Patient Spontanous Breathing and Patient connected to face mask oxygen  Post-op Assessment: Report given to RN and Post -op Vital signs reviewed and stable  Post vital signs: Reviewed and stable  Last Vitals:  Vitals Value Taken Time  BP 99/68 05/04/23 1438  Temp 36.1 1438  Pulse 94 05/04/23 1441  Resp 23 05/04/23 1441  SpO2 97 % 05/04/23 1441  Vitals shown include unvalidated device data.  Last Pain:  Vitals:   05/04/23 1216  TempSrc: Temporal  PainSc: 0-No pain      Patients Stated Pain Goal: 0 (05/04/23 1216)  Complications: No notable events documented.

## 2023-05-05 ENCOUNTER — Encounter: Payer: Self-pay | Admitting: Orthopedic Surgery

## 2023-05-05 DIAGNOSIS — F3162 Bipolar disorder, current episode mixed, moderate: Secondary | ICD-10-CM | POA: Diagnosis not present

## 2023-05-05 DIAGNOSIS — F1010 Alcohol abuse, uncomplicated: Secondary | ICD-10-CM | POA: Diagnosis not present

## 2023-05-05 DIAGNOSIS — F410 Panic disorder [episodic paroxysmal anxiety] without agoraphobia: Secondary | ICD-10-CM | POA: Diagnosis not present

## 2023-05-06 ENCOUNTER — Ambulatory Visit: Payer: Self-pay | Admitting: *Deleted

## 2023-05-06 DIAGNOSIS — C8200 Follicular lymphoma grade I, unspecified site: Secondary | ICD-10-CM | POA: Diagnosis not present

## 2023-05-06 DIAGNOSIS — G629 Polyneuropathy, unspecified: Secondary | ICD-10-CM | POA: Diagnosis not present

## 2023-05-06 DIAGNOSIS — C8590 Non-Hodgkin lymphoma, unspecified, unspecified site: Secondary | ICD-10-CM | POA: Diagnosis not present

## 2023-05-06 NOTE — Patient Outreach (Signed)
  Care Coordination   Follow Up Visit Note   05/06/2023 Name: Russell Simpson MRN: [id number] DOB: 1961/08/02  Russell Simpson is a 62 y.o. year old male who sees [name], NP for primary care. I spoke with  [name] by phone today.  What matters to the patients health and wellness today?  Plan for lung cancer    Goals Addressed             This Visit's Progress    Attend PT to help with recovrey from ankle surgery   On track    Care Coordination Interventions: Evaluation of current treatment plan related to recent ankle surgery and patient's adherence to plan as established by provider Reviewed scheduled/upcoming provider appointments including dermatology on 5/19, and pulmonary on 6/26 Discussed plans with patient for ongoing care management follow up and provided patient with direct contact information for care management team Does not know how to use blood glucose machine, reminded to take kit to PCP appointment today to be educated on use. Discussed ways to decrease pain in ankle, elevation when not walking helps to relieve pain/discomfort      Establish treatment plan for cancer       Care Coordination Interventions: Assessed patient understanding of cancer diagnosis and recommended treatment plan Reviewed upcoming provider appointments and treatment appointments Assessed available transportation to appointments and treatments. Has consistent/reliable transportation: Yes Assessed support system. Has consistent/reliable family or other support: Yes         SDOH assessments and interventions completed:  No     Care Coordination Interventions:  Yes, provided   Interventions Today    Flowsheet Row Most Recent Value  Chronic Disease   Chronic disease during today's visit Other  [ankle surgery and lung cancer]  General Interventions   General Interventions Discussed/Reviewed General Interventions Reviewed, Doctor Visits  [Recently diagnosed with new cancer,  unsure what treatment plan will be as of yet.  Noted that he had hardware removed from his ankle in the last week]  Doctor Visits Discussed/Reviewed Doctor Visits Reviewed, PCP, Specialist  [facility] tomorrow, ortho on 6/14, and pulmonary on 6/26]  PCP/Specialist Visits Compliance with follow-up visit  [facility] oncology today for 2nd opinion.  will have treatment plan after results]  Education Interventions   Education Provided Provided Education  Provided Verbal Education On When to see the doctor, Other  [Discussed importance of wearing ankle boot in effort to promote adequate healing]       Follow up plan: Follow up call scheduled for 6/28    Encounter Outcome:  Pt. Visit Completed   Kemper Durie, RN, MSN, [facility] Care Management Care Management Coordinator [phone]

## 2023-05-07 ENCOUNTER — Ambulatory Visit: Payer: Medicare HMO | Admitting: Nurse Practitioner

## 2023-05-07 ENCOUNTER — Other Ambulatory Visit: Payer: Self-pay | Admitting: Nurse Practitioner

## 2023-05-07 NOTE — Telephone Encounter (Signed)
Requested Prescriptions  Refused Prescriptions Disp Refills   amLODipine (NORVASC) 2.5 MG tablet [Pharmacy Med Name: AMLODIPINE BESYLATE 2.5 MG TAB] 90 tablet 0    Sig: TAKE 1 TABLET BY MOUTH ONCE DAILY     Cardiovascular: Calcium Channel Blockers 2 Passed - 05/07/2023  4:28 PM      Passed - Last BP in normal range    BP Readings from Last 1 Encounters:  05/04/23 111/78         Passed - Last Heart Rate in normal range    Pulse Readings from Last 1 Encounters:  05/04/23 (!) 102         Passed - Valid encounter within last 6 months    Recent Outpatient Visits           2 months ago Seasonal allergic rhinitis due to pollen   [facility] [name], NP   3 months ago Brief loss of consciousness   [place] [facility] [name], NP   4 months ago Brief loss of consciousness   [place] [facility] [name], NP   5 months ago Acute maxillary sinusitis, recurrence not specified   [place] [facility] Mecum, Erin E, PA-C   6 months ago Stage 2 moderate COPD by GOLD classification [facility])   [place] [facility] Mecum, Oswaldo Conroy, PA-C

## 2023-05-07 NOTE — Progress Notes (Deleted)
There were no vitals taken for this visit.   Subjective:    Patient ID: Russell Simpson, male    DOB: [birth year], 61 y.o.   MRN: [id number]  HPI: Russell Simpson is a 62 y.o. male  No chief complaint on file.  HYPERTENSION / HYPERLIPIDEMIA Satisfied with current treatment? {Blank single:19197::"yes","no"} Duration of hypertension: {Blank single:19197::"chronic","months","years"} BP monitoring frequency: {Blank single:19197::"not checking","rarely","daily","weekly","monthly","a few times a day","a few times a week","a few times a month"} BP range:  BP medication side effects: {Blank single:19197::"yes","no"} Past BP meds: {Blank multiple:19196::"none","amlodipine","amlodipine/benazepril","atenolol","benazepril","benazepril/HCTZ","bisoprolol (bystolic)","carvedilol","chlorthalidone","clonidine","diltiazem","exforge HCT","HCTZ","irbesartan (avapro)","labetalol","lisinopril","lisinopril-HCTZ","losartan (cozaar)","methyldopa","nifedipine","olmesartan (benicar)","olmesartan-HCTZ","quinapril","ramipril","spironalactone","tekturna","valsartan","valsartan-HCTZ","verapamil"} Duration of hyperlipidemia: {Blank single:19197::"chronic","months","years"} Cholesterol medication side effects: {Blank single:19197::"yes","no"} Cholesterol supplements: {Blank multiple:19196::"none","fish oil","niacin","red yeast rice"} Past cholesterol medications: {Blank multiple:19196::"none","atorvastain (lipitor)","lovastatin (mevacor)","pravastatin (pravachol)","rosuvastatin (crestor)","simvastatin (zocor)","vytorin","fenofibrate (tricor)","gemfibrozil","ezetimide (zetia)","niaspan","lovaza"} Medication compliance: {Blank single:19197::"excellent compliance","good compliance","fair compliance","poor compliance"} Aspirin: {Blank single:19197::"yes","no"} Recent stressors: {Blank single:19197::"yes","no"} Recurrent headaches: {Blank single:19197::"yes","no"} Visual changes: {Blank single:19197::"yes","no"} Palpitations:  {Blank single:19197::"yes","no"} Dyspnea: {Blank single:19197::"yes","no"} Chest pain: {Blank single:19197::"yes","no"} Lower extremity edema: {Blank single:19197::"yes","no"} Dizzy/lightheaded: {Blank single:19197::"yes","no"}   COPD COPD status: {Blank single:19197::"controlled","uncontrolled","better","worse","exacerbated","stable"} Satisfied with current treatment?: {Blank single:19197::"yes","no"} Oxygen use: {Blank single:19197::"yes","no"} Dyspnea frequency:  Cough frequency:  Rescue inhaler frequency:   Limitation of activity: {Blank single:19197::"yes","no"} Productive cough:  Last Spirometry:  Pneumovax: {Blank single:19197::"Up to Date","Not up to Date","unknown"} Influenza: {Blank single:19197::"Up to Date","Not up to Date","unknown"}  MOOD  Relevant past medical, surgical, family and social history reviewed and updated as indicated. Interim medical history since our last visit reviewed. Allergies and medications reviewed and updated.  Review of Systems  Per HPI unless specifically indicated above     Objective:    There were no vitals taken for this visit.  Wt Readings from Last 3 Encounters:  05/04/23 235 lb 0.2 oz (106.6 kg)  04/28/23 235 lb (106.6 kg)  04/09/23 238 lb (108 kg)    Physical Exam  Results for orders placed or performed in visit on 04/09/23  Comprehensive metabolic panel  Result Value Ref Range   Sodium 133 (L) 135 - 145 mmol/L   Potassium 4.2 3.5 - 5.1 mmol/L   Chloride 97 (L) 98 - 111 mmol/L   CO2 22 22 - 32 mmol/L   Glucose, Bld 169 (H) 70 - 99 mg/dL   BUN 11 6 - 20 mg/dL   Creatinine, Ser 4.09 0.61 - 1.24 mg/dL   Calcium 8.8 (L) 8.9 - 10.3 mg/dL   Total Protein 6.7 6.5 - 8.1 g/dL   Albumin 4.3 3.5 - 5.0 g/dL   AST 36 15 - 41 U/L   ALT 27 0 - 44 U/L   Alkaline Phosphatase 160 (H) 38 - 126 U/L   Total Bilirubin 0.6 0.3 - 1.2 mg/dL   GFR, Estimated >81 >19 mL/min   Anion gap 14 5 - 15  CBC with Differential  Result Value Ref  Range   WBC 8.3 4.0 - 10.5 K/uL   RBC 4.82 4.22 - 5.81 MIL/uL   Hemoglobin 15.2 13.0 - 17.0 g/dL   HCT 14.7 82.9 - 56.2 %   MCV 91.9 80.0 - 100.0 fL   MCH 31.5 26.0 - 34.0 pg   MCHC 34.3 30.0 - 36.0 g/dL   RDW 13.0 86.5 - 78.4 %   Platelets 105 (L) 150 - 400 K/uL   nRBC 0.0 0.0 - 0.2 %   Neutrophils Relative % 32 %   Neutro Abs 2.7 1.7 - 7.7 K/uL   Lymphocytes Relative 57 %   Lymphs Abs 4.7 (H) 0.7 - 4.0 K/uL   Monocytes Relative 8 %  Monocytes Absolute 0.7 0.1 - 1.0 K/uL   Eosinophils Relative 2 %   Eosinophils Absolute 0.2 0.0 - 0.5 K/uL   Basophils Relative 0 %   Basophils Absolute 0.0 0.0 - 0.1 K/uL   Immature Granulocytes 1 %   Abs Immature Granulocytes 0.04 0.00 - 0.07 K/uL      Assessment & Plan:   Problem List Items Addressed This Visit       Cardiovascular and Mediastinum   Aortic atherosclerosis (HCC)   Chronic diastolic CHF (congestive heart failure) (HCC)   HTN (hypertension) - Primary     Respiratory   Stage 2 moderate COPD by GOLD classification (HCC)     Other   Hyperlipidemia   Moderate episode of recurrent major depressive disorder (HCC)   Mixed bipolar I disorder (HCC)     Follow up plan: No follow-ups on file.

## 2023-05-11 ENCOUNTER — Other Ambulatory Visit: Payer: Self-pay | Admitting: Urology

## 2023-05-11 ENCOUNTER — Other Ambulatory Visit: Payer: Self-pay | Admitting: *Deleted

## 2023-05-11 DIAGNOSIS — C8202 Follicular lymphoma grade I, intrathoracic lymph nodes: Secondary | ICD-10-CM

## 2023-05-14 ENCOUNTER — Other Ambulatory Visit: Payer: Self-pay | Admitting: Nurse Practitioner

## 2023-05-17 NOTE — Telephone Encounter (Signed)
Requested Prescriptions  Pending Prescriptions Disp Refills   amLODipine (NORVASC) 2.5 MG tablet [Pharmacy Med Name: AMLODIPINE BESYLATE 2.5 MG TAB] 90 tablet 0    Sig: TAKE 1 TABLET BY MOUTH ONCE DAILY     Cardiovascular: Calcium Channel Blockers 2 Passed - 05/14/2023  5:24 PM      Passed - Last BP in normal range    BP Readings from Last 1 Encounters:  05/04/23 111/78         Passed - Last Heart Rate in normal range    Pulse Readings from Last 1 Encounters:  05/04/23 (!) 102         Passed - Valid encounter within last 6 months    Recent Outpatient Visits           2 months ago Seasonal allergic rhinitis due to pollen   [facility] [name], NP   3 months ago Brief loss of consciousness   [place] [facility] [name], NP   4 months ago Brief loss of consciousness   [place] [facility] [name], NP   5 months ago Acute maxillary sinusitis, recurrence not specified   [place] [facility] Mecum, Erin E, PA-C   6 months ago Stage 2 moderate COPD by GOLD classification [facility])   [place] [facility] Mecum, Oswaldo Conroy, PA-C

## 2023-05-18 ENCOUNTER — Telehealth: Payer: Self-pay | Admitting: Nurse Practitioner

## 2023-05-18 NOTE — Telephone Encounter (Signed)
Patient requesting clarification on which medications he is taking and why they have not been refilled.

## 2023-05-18 NOTE — Telephone Encounter (Signed)
Per pt every prescription that he is calling in is getting denied from his PCP, per what the pharmacy is telling him. I asked pt if it is all of his medications and pt states yes it is all of his medications. Pt states that he is needing his novast medication for his blood pressure and this has been denied, and he has been out of this medication for a week.  I do not see this on the medication list for the pt.   Pt is requesting a call back from a nurse regarding all of his medications.

## 2023-05-20 ENCOUNTER — Ambulatory Visit: Payer: Medicare HMO

## 2023-05-26 ENCOUNTER — Ambulatory Visit: Payer: Medicare HMO | Admitting: Pulmonary Disease

## 2023-05-28 ENCOUNTER — Ambulatory Visit: Payer: Self-pay | Admitting: *Deleted

## 2023-05-28 DIAGNOSIS — F3162 Bipolar disorder, current episode mixed, moderate: Secondary | ICD-10-CM | POA: Diagnosis not present

## 2023-05-28 DIAGNOSIS — F1010 Alcohol abuse, uncomplicated: Secondary | ICD-10-CM | POA: Diagnosis not present

## 2023-05-28 DIAGNOSIS — F410 Panic disorder [episodic paroxysmal anxiety] without agoraphobia: Secondary | ICD-10-CM | POA: Diagnosis not present

## 2023-05-28 NOTE — Patient Outreach (Signed)
  Care Coordination   Follow Up Visit Note   05/28/2023 Name: Russell Simpson MRN: [id number] DOB: 1961/06/03  Russell Simpson is a 62 y.o. year old male who sees [name], NP for primary care. I spoke with  [name] by phone today.  What matters to the patients health and wellness today?  Have PET scan and develop plan of care     Goals Addressed             This Visit's Progress    Attend PT to help with recovrey from ankle surgery   On track    Care Coordination Interventions: Evaluation of current treatment plan related to recent ankle surgery and patient's adherence to plan as established by provider Reviewed scheduled/upcoming provider appointments including dermatology on 5/19, and pulmonary on 6/26 Discussed plans with patient for ongoing care management follow up and provided patient with direct contact information for care management team Does not know how to use blood glucose machine, reminded to take kit to PCP appointment today to be educated on use. Discussed ways to decrease pain in ankle, elevation when not walking helps to relieve pain/discomfort      Establish treatment plan for cancer   On track    Care Coordination Interventions: Assessed patient understanding of cancer diagnosis and recommended treatment plan Reviewed upcoming provider appointments and treatment appointments Assessed available transportation to appointments and treatments. Has consistent/reliable transportation: Yes Assessed support system. Has consistent/reliable family or other support: Yes         SDOH assessments and interventions completed:  No     Care Coordination Interventions:  Yes, provided   Interventions Today    Flowsheet Row Most Recent Value  Chronic Disease   Chronic disease during today's visit Other  [lung cancer]  General Interventions   General Interventions Discussed/Reviewed General Interventions Reviewed, Doctor Visits  Doctor Visits  Discussed/Reviewed Doctor Visits Reviewed, PCP, Specialist  [pulmonary canceled for 6/26, advised to call to reschedule.  PET scan scheduled for 7/29]  PCP/Specialist Visits Compliance with follow-up visit  Education Interventions   Education Provided Provided Education  Provided Verbal Education On Medication, Other  [Reviewed medications, no longer on Norvasc, advised to monitor blood pressure daily since stopping]       Follow up plan: Follow up call scheduled for 7/31    Encounter Outcome:  Pt. Visit Completed   Kemper Durie, RN, MSN, [place] [facility] Care Management Care Management Coordinator [phone]

## 2023-06-02 ENCOUNTER — Ambulatory Visit: Payer: Self-pay

## 2023-06-02 NOTE — Telephone Encounter (Signed)
     Chief Complaint: Numbness to all toes Symptoms: Above Frequency: 2 months Pertinent Negatives: Patient denies pain Disposition: [] ED /[] Urgent Care (no appt availability in office) / [x] Appointment(In office/virtual)/ []  [facility]/ [] Home Care/ [] Refused Recommended Disposition /[] [facility]/ []  Follow-up with PCP Additional Notes:   Reason for Disposition  [1] Numbness or tingling in one or both feet AND [2] is a chronic symptom (recurrent or ongoing AND present > 4 weeks)  Answer Assessment - Initial Assessment Questions 1. SYMPTOM: "What is the main symptom you are concerned about?" (e.g., weakness, numbness)     Numbness to toes 2. ONSET: "When did this start?" (minutes, hours, days; while sleeping)     2 months ago 3. LAST NORMAL: "When was the last time you (the patient) were normal (no symptoms)?"     2 months  4. PATTERN "Does this come and go, or has it been constant since it started?"  "Is it present now?"     Constant 5. CARDIAC SYMPTOMS: "Have you had any of the following symptoms: chest pain, difficulty breathing, palpitations?"     No 6. NEUROLOGIC SYMPTOMS: "Have you had any of the following symptoms: headache, dizziness, vision loss, double vision, changes in speech, unsteady on your feet?"     No 7. OTHER SYMPTOMS: "Do you have any other symptoms?"     Skin is brown in color 8. PREGNANCY: "Is there any chance you are pregnant?" "When was your last menstrual period?"     N/a  Protocols used: Neurologic Deficit-A-AH

## 2023-06-10 ENCOUNTER — Encounter: Payer: Self-pay | Admitting: Family Medicine

## 2023-06-10 ENCOUNTER — Ambulatory Visit (INDEPENDENT_AMBULATORY_CARE_PROVIDER_SITE_OTHER): Payer: Medicare HMO | Admitting: Family Medicine

## 2023-06-10 VITALS — BP 129/80 | HR 89 | Temp 97.9°F | Wt 248.0 lb

## 2023-06-10 DIAGNOSIS — R202 Paresthesia of skin: Secondary | ICD-10-CM

## 2023-06-10 DIAGNOSIS — H04203 Unspecified epiphora, bilateral lacrimal glands: Secondary | ICD-10-CM | POA: Diagnosis not present

## 2023-06-10 NOTE — Progress Notes (Signed)
BP 129/80   Pulse 89   Temp 97.9 F (36.6 C) (Oral)   Wt 248 lb (112.5 kg)   SpO2 91%   BMI 31.84 kg/m    Subjective:    Patient ID: Russell Simpson, male    DOB: 20-Apr-1961, 62 y.o.   MRN: [id number]  HPI: Russell Simpson is a 62 y.o. male  Chief Complaint  Patient presents with   toe numbness    Pt states its been going on a couple of months.   Complains of crust, grit, and watery bilateral eyes in the morning requiring him to wipe his eyes. He denies erythema or pain. In the past he tried otc drops recommended by eye doctor.   TOE PAIN Has history of bilateral leg fractures with rods and screws placed. He had chemo over a year ago d/t hodgkin lymphoma. He is followed by [facility] heme/oncology. Duration: 2 months when barefoot feels like putty under toes Involved toe:  right and left on all toes   Mechanism of injury:  fell Onset: sudden Severity: severe  Quality:  numbness and tingling Frequency: constant Radiation: no Aggravating factors:  None   Alleviating factors: nothing  Status: stable Treatments attempted: nothing  Relief with NSAIDs?: No NSAIDs Taken Morning stiffness: no Redness: yes left Bruising: no Swelling: no Paresthesias / decreased sensation: yes Fevers: no   Relevant past medical, surgical, family and social history reviewed and updated as indicated. Interim medical history since our last visit reviewed. Allergies and medications reviewed and updated.  Review of Systems  Constitutional:  Negative for fever.  Eyes:  Positive for discharge. Negative for pain, redness, itching and visual disturbance.       Watery eyes  Respiratory: Negative.    Cardiovascular: Negative.   Skin:  Positive for color change.       Brown discoloration on anterior foot at base of toes.   Neurological:  Positive for numbness. Negative for dizziness, tremors, seizures, syncope, facial asymmetry, speech difficulty, weakness, light-headedness and headaches.       Bilateral  toes    Per HPI unless specifically indicated above     Objective:    BP 129/80   Pulse 89   Temp 97.9 F (36.6 C) (Oral)   Wt 248 lb (112.5 kg)   SpO2 91%   BMI 31.84 kg/m   Wt Readings from Last 3 Encounters:  06/10/23 248 lb (112.5 kg)  05/04/23 235 lb 0.2 oz (106.6 kg)  04/28/23 235 lb (106.6 kg)    Physical Exam Vitals and nursing note reviewed.  Constitutional:      General: He is awake. He is not in acute distress.    Appearance: Normal appearance. He is well-developed and well-groomed. He is not ill-appearing.  HENT:     Head: Normocephalic and atraumatic.     Right Ear: Hearing and external ear normal. No drainage.     Left Ear: Hearing and external ear normal. No drainage.     Nose: Nose normal.  Eyes:     General: Lids are normal.        Right eye: No discharge.        Left eye: No discharge.     Conjunctiva/sclera: Conjunctivae normal.  Cardiovascular:     Rate and Rhythm: Normal rate and regular rhythm.     Pulses:          Dorsalis pedis pulses are 2+ on the right side and 2+ on the left side.  Posterior tibial pulses are 2+ on the right side and 2+ on the left side.     Heart sounds: Normal heart sounds, S1 normal and S2 normal. No murmur heard.    No gallop.  Pulmonary:     Effort: Pulmonary effort is normal. No accessory muscle usage or respiratory distress.     Breath sounds: Normal breath sounds.  Musculoskeletal:        General: Normal range of motion.     Cervical back: Full passive range of motion without pain and normal range of motion.     Right lower leg: No edema.     Left lower leg: No edema.  Feet:     Right foot:     Skin integrity: Skin integrity normal.     Left foot:     Skin integrity: Skin integrity normal.     Comments: Decreased sensation on sole of bilateral big toe Skin:    General: Skin is warm and dry.     Capillary Refill: Capillary refill takes less than 2 seconds.       Neurological:     Mental Status: He  is alert and oriented to person, place, and time.     Sensory: No sensory deficit.  Psychiatric:        Attention and Perception: Attention normal.        Mood and Affect: Mood normal.        Speech: Speech normal.        Behavior: Behavior normal. Behavior is cooperative.        Thought Content: Thought content normal.     Results for orders placed or performed in visit on 04/09/23  Comprehensive metabolic panel  Result Value Ref Range   Sodium 133 (L) 135 - 145 mmol/L   Potassium 4.2 3.5 - 5.1 mmol/L   Chloride 97 (L) 98 - 111 mmol/L   CO2 22 22 - 32 mmol/L   Glucose, Bld 169 (H) 70 - 99 mg/dL   BUN 11 6 - 20 mg/dL   Creatinine, Ser 8.29 0.61 - 1.24 mg/dL   Calcium 8.8 (L) 8.9 - 10.3 mg/dL   Total Protein 6.7 6.5 - 8.1 g/dL   Albumin 4.3 3.5 - 5.0 g/dL   AST 36 15 - 41 U/L   ALT 27 0 - 44 U/L   Alkaline Phosphatase 160 (H) 38 - 126 U/L   Total Bilirubin 0.6 0.3 - 1.2 mg/dL   GFR, Estimated >56 >21 mL/min   Anion gap 14 5 - 15  CBC with Differential  Result Value Ref Range   WBC 8.3 4.0 - 10.5 K/uL   RBC 4.82 4.22 - 5.81 MIL/uL   Hemoglobin 15.2 13.0 - 17.0 g/dL   HCT 30.8 65.7 - 84.6 %   MCV 91.9 80.0 - 100.0 fL   MCH 31.5 26.0 - 34.0 pg   MCHC 34.3 30.0 - 36.0 g/dL   RDW 96.2 95.2 - 84.1 %   Platelets 105 (L) 150 - 400 K/uL   nRBC 0.0 0.0 - 0.2 %   Neutrophils Relative % 32 %   Neutro Abs 2.7 1.7 - 7.7 K/uL   Lymphocytes Relative 57 %   Lymphs Abs 4.7 (H) 0.7 - 4.0 K/uL   Monocytes Relative 8 %   Monocytes Absolute 0.7 0.1 - 1.0 K/uL   Eosinophils Relative 2 %   Eosinophils Absolute 0.2 0.0 - 0.5 K/uL   Basophils Relative 0 %   Basophils Absolute 0.0 0.0 -  0.1 K/uL   Immature Granulocytes 1 %   Abs Immature Granulocytes 0.04 0.00 - 0.07 K/uL      Assessment & Plan:   Problem List Items Addressed This Visit   None Visit Diagnoses     Paresthesia of foot, bilateral    -  Primary   Acute, ongoing. Referral made to neurology and vascular surgery.    Relevant Orders   Ambulatory referral to Vascular Surgery   Ambulatory referral to Neurology   Watery eyes       Acute, ongoing. Recommend OTC pataday eye drops. If symptoms do not improve recommend trying PO antihistamine.        Follow up plan: Return if symptoms worsen or fail to improve, for Follow up BP.

## 2023-06-10 NOTE — Patient Instructions (Signed)
Try Olapatadine eye drop over the counter Follow up with vascular surgery and neurology

## 2023-06-14 ENCOUNTER — Telehealth: Payer: Self-pay | Admitting: Oncology

## 2023-06-14 NOTE — Telephone Encounter (Signed)
Russell Simpson called and asked to send a message to [name] that he wants an appointment to get his port a cath taken out please.

## 2023-06-16 DIAGNOSIS — S8261XS Displaced fracture of lateral malleolus of right fibula, sequela: Secondary | ICD-10-CM | POA: Diagnosis not present

## 2023-06-16 DIAGNOSIS — M25571 Pain in right ankle and joints of right foot: Secondary | ICD-10-CM | POA: Diagnosis not present

## 2023-06-17 MED ORDER — AMLODIPINE BESYLATE 2.5 MG PO TABS: 2.5000 mg | ORAL_TABLET | Freq: Every day | ORAL | 0 refills | Status: DC

## 2023-06-17 NOTE — Telephone Encounter (Signed)
Called patient to verify he had his wellbutrin taken care of. He stated he did with his psychiatrist.

## 2023-06-17 NOTE — Addendum Note (Signed)
Addended by: Aura Dials T on: 06/17/2023 10:29 AM   Modules accepted: Orders

## 2023-06-17 NOTE — Telephone Encounter (Signed)
Patient is overdue for an appointment. Patient's list routine visit was 02/01/23, patient was advised to follow up in 3 months. Appointment scheduled for 05/07/23, but patient no showed appointment. Patient was seen 06/10/23 for an acute visit with Rashelle. According to chart, patient was taking Amlodipine 2.5 mg at the March visit and was stable on medication dose per [name]'s note. Unclear as to why the medication is no longer on the patient's current medication list. Routing to provider to advise.

## 2023-06-17 NOTE — Telephone Encounter (Signed)
90 day supply of Amlodipine sent in by Jolene. Please call and schedule the patient a follow up appointment.

## 2023-06-17 NOTE — Telephone Encounter (Signed)
Follow up appointment has been made for 8/19

## 2023-06-17 NOTE — Telephone Encounter (Signed)
completed

## 2023-06-18 DIAGNOSIS — F1010 Alcohol abuse, uncomplicated: Secondary | ICD-10-CM | POA: Diagnosis not present

## 2023-06-18 DIAGNOSIS — F3162 Bipolar disorder, current episode mixed, moderate: Secondary | ICD-10-CM | POA: Diagnosis not present

## 2023-06-18 DIAGNOSIS — F410 Panic disorder [episodic paroxysmal anxiety] without agoraphobia: Secondary | ICD-10-CM | POA: Diagnosis not present

## 2023-06-21 ENCOUNTER — Telehealth: Payer: Self-pay | Admitting: *Deleted

## 2023-06-21 NOTE — Telephone Encounter (Signed)
Patient had left a message saying that he wants his Port-A-Cath taken out.  I sent it to Dr. Smith Donya and she says that it is best if we see him first and then determine if working to get a port taken out.  I called the patient to tell him ab. out this he says that he is okay with getting an appointment and talking to her.  Made the appointment this Friday at 2:30.  The patient did tell me that he is in so much get with all the bills here.  I told him that there was a financial paper that he could fill out and see if they could give him any kind of help with that.  He states he went over to the financial area here at the cancer center but it is over at [facility] and they told him after he filled it out that he would not qualify for any help.

## 2023-06-24 ENCOUNTER — Other Ambulatory Visit: Payer: Medicare HMO

## 2023-06-24 DIAGNOSIS — N401 Enlarged prostate with lower urinary tract symptoms: Secondary | ICD-10-CM | POA: Diagnosis not present

## 2023-06-24 DIAGNOSIS — E291 Testicular hypofunction: Secondary | ICD-10-CM

## 2023-06-24 DIAGNOSIS — N5201 Erectile dysfunction due to arterial insufficiency: Secondary | ICD-10-CM | POA: Diagnosis not present

## 2023-06-24 DIAGNOSIS — N138 Other obstructive and reflux uropathy: Secondary | ICD-10-CM | POA: Diagnosis not present

## 2023-06-25 ENCOUNTER — Inpatient Hospital Stay: Payer: Medicare HMO | Attending: Oncology | Admitting: Oncology

## 2023-06-25 ENCOUNTER — Inpatient Hospital Stay: Payer: Medicare HMO

## 2023-06-25 ENCOUNTER — Encounter: Payer: Self-pay | Admitting: Oncology

## 2023-06-25 VITALS — BP 113/88 | HR 71 | Temp 97.6°F | Resp 18 | Ht 74.0 in | Wt 237.9 lb

## 2023-06-25 DIAGNOSIS — Z8572 Personal history of non-Hodgkin lymphomas: Secondary | ICD-10-CM | POA: Insufficient documentation

## 2023-06-25 DIAGNOSIS — C8202 Follicular lymphoma grade I, intrathoracic lymph nodes: Secondary | ICD-10-CM

## 2023-06-25 DIAGNOSIS — Z87891 Personal history of nicotine dependence: Secondary | ICD-10-CM | POA: Insufficient documentation

## 2023-06-25 DIAGNOSIS — Z95828 Presence of other vascular implants and grafts: Secondary | ICD-10-CM

## 2023-06-25 DIAGNOSIS — Z452 Encounter for adjustment and management of vascular access device: Secondary | ICD-10-CM | POA: Diagnosis not present

## 2023-06-25 MED ORDER — HEPARIN SOD (PORK) LOCK FLUSH 100 UNIT/ML IV SOLN
500.0000 [IU] | Freq: Once | INTRAVENOUS | Status: AC
  Administered 2023-06-25: 500 [IU]
  Filled 2023-06-25: qty 5

## 2023-06-25 MED ORDER — SODIUM CHLORIDE 0.9% FLUSH
10.0000 mL | Freq: Once | INTRAVENOUS | Status: AC
  Administered 2023-06-25: 10 mL via INTRAVENOUS
  Filled 2023-06-25: qty 10

## 2023-06-27 NOTE — Progress Notes (Signed)
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 [name]  He sustained a left anterior frontal sinus as well as supraorbital rim fracture on 11/21/2019 which was managed conservatively.  He then presented to the ER at [facility] with symptoms of right-sided chest wall pain this led to a CT PE which did not show any evidence of pulmonary embolism.  He was noted to have bilateral symmetric axillary adenopathy measuring up to 1.8 cm.  Scattered mediastinal adenopathy.  Right paratracheal node measuring up to 1.2 cm.  Prominent right costophrenic lymph node measuring up to 1 cm.  Findings are nonspecific but could be seen with lymphoma versus systemic inflammatory disease such as sarcoidosis.  Of note patient has had a prior CT scan for lung screening back in 2015 when he was not noted to have this adenopathy.    Repeat CT chest in August 2021 showed persistent mild nonbulky axillary and mediastinal adenopathy which had not changed significantly as compared to his prior CT in December 2020.  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   Repeat CT in March 2022 showed  Progression and multistation adenopathy as well as significant splenomegaly measuring 20.4 cm as compared to 14.5 cm on CT scan September 2021.  CT scan showed multistation adenopathy with SUVs ranging between 6-8.9.  Patient underwent another core biopsy of right inguinal lymph node which was consistent with follicular lymphoma grade 1-2.  Bone marrow biopsy also showed moderate involvement with non-Hodgkin's B-cell lymphoma.  Baseline hepatitis B testing showed core antibody positivity.  Seen by GI and started on tenofovir   Patient had symptoms of nausea and sweating during cycle 1 of Rituxan which resolved with additional premedications.  He developed symptoms of itching over neck chest back and bilateral eyes with second dose of Rituxan early and had to get more premedications and was not rechallenged on the same day.  Patient subsequently received cycle 3 of Bendamustine Rituxan chemotherapy after Rituxan was given as a split dose over 3 days   Scans after 6 cycles of Bendamustine Rituxan chemotherapy in October 2022 showed significant improvement with therapy.  Lymphadenopathy in his neck and mediastinum resolved Deauville 2.  Decrease in the splenic size and hypermetabolism.  No new lesions.  FDG accumulation within the skeleton presumably due to stimulatory effects of treatment.   Scans in April 2023 showed pulmonary nodules requiring further assessment and patient was seen by pulmonary.  He also had a PET CT scan in October 2023 which showed gradual enlargement and hypermetabolic some noted in the lung nodules.  Patient also has intra-abdominal lymph nodes ranging from 9 to 1.2 cm with an SUV less than 3.  Patient had right lower lobe lung biopsy which was negative for  malignancy.  Interval history-patient currently reports tingling and numbness especially in his bilateral feet which is gradually worsening and he is undergoing workup with neurology.  ECOG PS- 1 Pain scale- 0  Review of  systems- Review of Systems  Constitutional:  Positive for malaise/fatigue. Negative for chills, fever and weight loss.  HENT:  Negative for congestion, ear discharge and nosebleeds.   Eyes:  Negative for blurred vision.  Respiratory:  Negative for cough, hemoptysis, sputum production, shortness of breath and wheezing.   Cardiovascular:  Negative for chest pain, palpitations, orthopnea and claudication.  Gastrointestinal:  Negative for abdominal pain, blood in stool, constipation, diarrhea, heartburn, melena, nausea and vomiting.  Genitourinary:  Negative for dysuria, flank pain, frequency, hematuria and urgency.  Musculoskeletal:  Negative for back pain, joint pain and myalgias.  Skin:  Negative for rash.  Neurological:  Positive for sensory change (Peripheral neuropathy). Negative for dizziness, tingling, focal weakness, seizures, weakness and headaches.  Endo/Heme/Allergies:  Does not bruise/bleed easily.  Psychiatric/Behavioral:  Negative for depression and suicidal ideas. The patient does not have insomnia.       Allergies  Allergen Reactions   Penicillins Anaphylaxis    Tolerates cefdinir, cephalexin and amoxicillin/clavulonate so true PCN allergy unlikely   Tetanus Toxoids Swelling   Lisinopril Cough   Losartan      Other reaction(s): Muscle Pain     Codeine Nausea Only and Nausea And Vomiting   Doxycycline Rash   Ruxience [Rituximab-Pvvr] Rash    05/06/21 pt w/ worsening rash during Ruxience infusion.  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HCC)    Arthritis    Asthma    Basal cell carcinoma 01/28/2021   R upper arm, EDC 03/04/2021   Basal cell carcinoma 12/31/2022   Right temporal hair line. Nodular. 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   left upper back ED&C done   Hypertension    Hyponatremia    Inappropriate sinus tachycardia    Non Hodgkin's lymphoma (HCC)    Pneumonia    RSV (respiratory syncytial virus infection)    SCC (squamous cell carcinoma) 08/28/2021   upper chest right of midline, EDC done 09/17/2021   SCC (squamous cell carcinoma) 09/30/2022   left chest  ED&C done   Sleep apnea    does not wear CPAP ; uses humidifier instead   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   right upper back, ED&C 06/18/2022   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   right upper back ED&C done   Squamous cell carcinoma of skin 02/19/2021   L inferior mandible, treated with EDC   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/28/2021   R ant shoulder -  ED&C   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/28/2021   L upper abdomen - ED&C     Past Surgical History:  Procedure Laterality Date   ANTERIOR CERVICAL DECOMP/DISCECTOMY FUSION N/A 06/17/2016   Procedure: ANTERIOR CERVICAL DECOMPRESSION FUSION, CERVICAL 3-4, CERVICAL 4-5 WITH INSTRUMENTATION AND ALLOGRAFT;  Surgeon: Estill Bamberg, MD;  Location: [facility] OR;  Service: Orthopedics;  Laterality: N/A;  ANTERIOR CERVICAL DECOMPRESSION FUSION, CERVICAL 3-4, CERVICAL 4-5 WITH INSTRUMENTATION AND ALLOGRAFT   BACK SURGERY     x3   HARDWARE REMOVAL Right 05/04/2023   Procedure: HARDWARE REMOVAL;  Surgeon: Juanell Fairly, MD;  Location: [facility] ORS;  Service: Orthopedics;  Laterality: Right;   NECK SURGERY  12/01/2007   ORIF ANKLE  FRACTURE Right 12/02/2022   Procedure: OPEN REDUCTION INTERNAL FIXATION (ORIF) ANKLE FRACTURE;  Surgeon: Juanell Fairly, MD;  Location: [facility] ORS;  Service: Orthopedics;  Laterality: Right;   ORIF ANKLE FRACTURE Right 12/01/2022   Procedure: OPEN REDUCTION INTERNAL FIXATION (ORIF) ANKLE FRACTURE;  Surgeon: Juanell Fairly, MD;  Location: [facility] ORS;  Service: Orthopedics;  Laterality: Right;   PORTA CATH INSERTION N/A 04/03/2021   Procedure: PORTA CATH INSERTION;  Surgeon: Annice Needy, MD;  Location: [facility] INVASIVE CV LAB;  Service: Cardiovascular;  Laterality: N/A;   VIDEO BRONCHOSCOPY WITH ENDOBRONCHIAL ULTRASOUND Bilateral 10/14/2022   Procedure: VIDEO BRONCHOSCOPY WITH ENDOBRONCHIAL ULTRASOUND;  Surgeon: Salena Saner, MD;  Location: [facility] ORS;  Service: Pulmonary;  Laterality: Bilateral;    Social History   Socioeconomic History   Marital status: Divorced    Spouse name: Not on file   Number of children: Not on file   Years of education: Not on file   Highest education level: Not on file  Occupational History   Not on file  Tobacco Use   Smoking status: Former    Types: Cigars    Quit date: 11/30/2022    Years since quitting: 0.5   Smokeless tobacco: Never  Vaping Use   Vaping status: Former   Start date: 04/30/2017   Quit date: 11/30/2017  Substance and Sexual Activity   Alcohol use: Yes    Comment: beers 6 a day   Drug use: No   Sexual activity: Not Currently  Other Topics Concern   Not on file  Social History Narrative   Not on file   Social Determinants of Health   Financial Resource Strain: Medium Risk (04/05/2023)   Overall Financial Resource Strain (CARDIA)    Difficulty of Paying Living Expenses: Somewhat hard  Food Insecurity: Food Insecurity Present (05/07/2023)   Received from [facility]   Hunger Vital Sign    Worried About Running Out of Food in the Last Year: Sometimes true    Ran Out of Food in the Last Year: Sometimes true  Transportation Needs: No  Transportation Needs (04/05/2023)   PRAPARE - Administrator, Civil Service (Medical): No    Lack of Transportation (Non-Medical): No  Physical Activity: Inactive (01/01/2023)   Exercise Vital Sign    Days of Exercise per Week: 0 days    Minutes of Exercise per Session: 0 min  Stress: No Stress Concern Present (04/05/2023)   [name] of Occupational Health - Occupational Stress Questionnaire    Feeling of Stress : Only a little  Social Connections: Socially Isolated (04/05/2023)   Social Connection and Isolation Panel [NHANES]    Frequency of Communication with Friends and Family: More than three times a week    Frequency of Social Gatherings  with Friends and Family: Twice a week    Attends Religious Services: Never    Database administrator or Organizations: No    Attends Banker Meetings: Never    Marital Status: Divorced  Catering manager Violence: Not At Risk (04/05/2023)   Humiliation, Afraid, Rape, and Kick questionnaire    Fear of Current or Ex-Partner: No    Emotionally Abused: No    Physically Abused: No    Sexually Abused: No    Family History  Problem Relation Age of Onset   Diabetes Mother    Heart attack Father    Emphysema Father    Diabetes Other      Current Outpatient Medications:    albuterol (VENTOLIN HFA) 108 (90 Base) MCG/ACT inhaler, Inhale 2 puffs into the lungs every 6 (six) hours as needed for wheezing or shortness of breath., Disp: 18 g, Rfl: 6   amLODipine (NORVASC) 2.5 MG tablet, Take 1 tablet (2.5 mg total) by mouth daily., Disp: 90 tablet, Rfl: 0   atorvastatin (LIPITOR) 80 MG tablet, Take 1 tablet (80 mg total) by mouth daily. (Patient taking differently: Take 80 mg by mouth every evening.), Disp: 90 tablet, Rfl: 3   Blood Glucose Monitoring Suppl (ONE TOUCH ULTRA 2) w/Device KIT, 1 each by Does not apply route daily., Disp: 1 kit, Rfl: 0   buPROPion (WELLBUTRIN SR) 150 MG 12 hr tablet, Take 150 mg by mouth 2 (two) times  daily., Disp: , Rfl:    calcipotriene (DOVONOX) 0.005 % cream, Apply topically 2 (two) times daily. Apply twice a day after fluorouracil to affected areas as directed in handout., Disp: 60 g, Rfl: 1   cetirizine (ZYRTEC) 10 MG tablet, Take 1 tablet (10 mg total) by mouth daily., Disp: 30 tablet, Rfl: 11   clonazePAM (KLONOPIN) 0.5 MG tablet, Take 0.5 mg by mouth every evening., Disp: , Rfl:    DULoxetine (CYMBALTA) 60 MG capsule, Take 60 mg by mouth every evening., Disp: , Rfl:    famotidine (PEPCID) 20 MG tablet, Take 1 tablet (20 mg total) by mouth 2 (two) times daily., Disp: 180 tablet, Rfl: 1   finasteride (PROSCAR) 5 MG tablet, TAKE 1 TABLET BY MOUTH ONCE DAILY, Disp: 90 tablet, Rfl: 0   fluticasone (FLONASE) 50 MCG/ACT nasal spray, Place 2 sprays into both nostrils daily., Disp: 16 g, Rfl: 6   Fluticasone-Umeclidin-Vilant (TRELEGY ELLIPTA) 200-62.5-25 MCG/ACT AEPB, Inhale 1 puff into the lungs daily., Disp: 14 each, Rfl: 0   glucose blood (ONETOUCH ULTRA) test strip, 1 each by Other route daily. Use as instructed, Disp: 100 each, Rfl: 3   Lancets (ONETOUCH ULTRASOFT) lancets, 1 each by Other route daily. Use as instructed, Disp: 100 each, Rfl: 3   metoprolol succinate (TOPROL-XL) 100 MG 24 hr tablet, TAKE 1 TABLET BY MOUTH ONCE DAILY WITH OR IMMEDIATELY FOLLOWING A MEAL, Disp: 90 tablet, Rfl: 1   mirtazapine (REMERON) 15 MG tablet, Take 15 mg by mouth at bedtime., Disp: , Rfl:    montelukast (SINGULAIR) 10 MG tablet, Take 10 mg by mouth daily., Disp: , Rfl:    naltrexone (DEPADE) 50 MG tablet, Take 50 mg by mouth daily., Disp: , Rfl:    OLANZapine (ZYPREXA) 5 MG tablet, Take 5 mg by mouth at bedtime., Disp: , Rfl:    sildenafil (VIAGRA) 100 MG tablet, Take 1 tablet (100 mg total) by mouth daily as needed for erectile dysfunction. Take two hours prior to intercourse on an empty stomach, Disp: 30  tablet, Rfl: 3   tenofovir (VIREAD) 300 MG tablet, TAKE 1 TABLET BY MOUTH ONCE DAILY, Disp: 90  tablet, Rfl: 3   testosterone cypionate (DEPOTESTOSTERONE CYPIONATE) 200 MG/ML injection, Inject 1 mL (200 mg total) into the muscle every 14 (fourteen) days., Disp: 4 mL, Rfl: 0   traMADol (ULTRAM) 50 MG tablet, Take 1 tablet (50 mg total) by mouth every 6 (six) hours as needed., Disp: 30 tablet, Rfl: 0   triamcinolone ointment (KENALOG) 0.1 %, Apply to aa's arms BID up to two weeks. Avoid applying to face, groin, and axilla. Use as directed. Long-term use can cause thinning of the skin., Disp: 30 g, Rfl: 0 No current facility-administered medications for this visit.  Facility-Administered Medications Ordered in Other Visits:    heparin lock flush 100 unit/mL, 500 Units, Intravenous, Once, Creig Hines, MD   heparin lock flush 100 unit/mL, 500 Units, Intravenous, Once, Creig Hines, MD   sodium chloride flush (NS) 0.9 % injection 10 mL, 10 mL, Intravenous, PRN, Creig Hines, MD  Physical exam:  Vitals:   06/25/23 1417  BP: 113/88  Pulse: 71  Resp: 18  Temp: 97.6 F (36.4 C)  TempSrc: Tympanic  SpO2: 96%  Weight: 237 lb 14.4 oz (107.9 kg)  Height: 6\' 2"  (1.88 m)   Physical Exam Cardiovascular:     Rate and Rhythm: Normal rate and regular rhythm.     Heart sounds: Normal heart sounds.  Pulmonary:     Effort: Pulmonary effort is normal.     Breath sounds: Normal breath sounds.  Abdominal:     General: Bowel sounds are normal.     Palpations: Abdomen is soft.     Comments: No palpable hepatomegamegaly  Lymphadenopathy:     Comments: No palpable cervical, supraclavicular, axillary or inguinal adenopathy    Skin:    General: Skin is warm and dry.  Neurological:     Mental Status: He is alert and oriented to person, place, and time.         Latest Ref Rng & Units 04/09/2023    3:04 PM  CMP  Glucose 70 - 99 mg/dL 366   BUN 6 - 20 mg/dL 11   Creatinine 4.40 - 1.24 mg/dL 3.47   Sodium 425 - 956 mmol/L 133   Potassium 3.5 - 5.1 mmol/L 4.2   Chloride 98 - 111 mmol/L  97   CO2 22 - 32 mmol/L 22   Calcium 8.9 - 10.3 mg/dL 8.8   Total Protein 6.5 - 8.1 g/dL 6.7   Total Bilirubin 0.3 - 1.2 mg/dL 0.6   Alkaline Phos 38 - 126 U/L 160   AST 15 - 41 U/L 36   ALT 0 - 44 U/L 27       Latest Ref Rng & Units 06/24/2023    2:23 PM  CBC  Hemoglobin 13.0 - 17.7 g/dL 38.7   Hematocrit 56.4 - 51.0 % 39.9      Assessment and plan- Patient is a 62 y.o. male with history of stage IV follicular lymphoma here to discuss further management  Patient's last CT scan was in April 2024 which showed gradually increasing intra-abdominal adenopathy as well as splenomegaly.  He has waxing and waning lung nodules the etiology of which is unclear and biopsies have been negative for lymphoma.  He was noted to have thrombocytopenia with a platelet count in the 90s but this could be secondary to his alcohol intake as well.  He does drink about  1 case of beer every night.  Clinically patient does not have any adenopathy or hepatosplenomegaly.  He does not wish to pursue CT scans every 6 months due to costs involved.  I will see him tentatively in 4 to 5 months time with repeat labs.  He will continue with port flushes every2 to 3 months.  I will plan to repeat CT scans in April 2025 as per patient's wishes since he does not wish to undergo them in October.   Visit Diagnosis 1. 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 Owens Shark, MD, MPH [facility] [phone] 06/27/2023 2:38 PM

## 2023-06-28 ENCOUNTER — Other Ambulatory Visit: Payer: Medicare HMO

## 2023-06-30 ENCOUNTER — Ambulatory Visit: Payer: Self-pay | Admitting: *Deleted

## 2023-06-30 NOTE — Patient Outreach (Signed)
  Care Coordination   06/30/2023 Name: Russell Simpson MRN: [id number] DOB: August 27, 1961   Care Coordination Outreach Attempts:  An unsuccessful telephone outreach was attempted for a scheduled appointment today.  Follow Up Plan:  Additional outreach attempts will be made to offer the patient care coordination information and services.   Encounter Outcome:  No Answer   Care Coordination Interventions:  No, not indicated    Kemper Durie, RN, MSN, [facility] Care Management Care Management Coordinator [phone]

## 2023-07-06 ENCOUNTER — Ambulatory Visit: Payer: Medicare HMO | Admitting: Oncology

## 2023-07-06 ENCOUNTER — Other Ambulatory Visit: Payer: Medicare HMO

## 2023-07-07 ENCOUNTER — Telehealth: Payer: Self-pay | Admitting: *Deleted

## 2023-07-07 ENCOUNTER — Telehealth: Payer: Self-pay | Admitting: Urology

## 2023-07-07 NOTE — Telephone Encounter (Signed)
Patient called to reschedule missed appointments from yesterday.

## 2023-07-07 NOTE — Patient Outreach (Signed)
  Care Coordination   Follow Up Visit Note   07/08/2023 Name: Russell Simpson MRN: [id number] DOB: 1960-12-28  Russell Simpson is a 62 y.o. year old male who sees [name], NP for primary care. I spoke with  [name] by phone today.  What matters to the patients health and wellness today?  Stay healthy and decrease out of pocket cost of care.  State he is unable to pay anything extra due to fixed income    Goals Addressed             This Visit's Progress    COMPLETED: Attend PT to help with recovrey from ankle surgery   On track    Care Coordination Interventions: Evaluation of current treatment plan related to recent ankle surgery and patient's adherence to plan as established by provider Discussed plans with patient for ongoing care management follow up and provided patient with direct contact information for care management team Does not know how to use blood glucose machine, reminded to take kit to PCP appointment today to be educated on use. Discussed ways to decrease pain in ankle, elevation when not walking helps to relieve pain/discomfort   8/7 - Recovered from surgery     Establish treatment plan for cancer   On track    Care Coordination Interventions: Assessed patient understanding of cancer diagnosis and recommended treatment plan Reviewed upcoming provider appointments and treatment appointments Assessed available transportation to appointments and treatments. Has consistent/reliable transportation: Yes Assessed support system. Has consistent/reliable family or other support: Yes         SDOH assessments and interventions completed:  No     Care Coordination Interventions:  Yes, provided   Interventions Today    Flowsheet Row Most Recent Value  Chronic Disease   Chronic disease during today's visit Congestive Heart Failure (CHF), Other  [lung cancer]  General Interventions   General Interventions Discussed/Reviewed Doctor Visits, General  Interventions Reviewed  [Canceled routine CT scan due to cost.  State he is unable to afford, discussed with oncology team and they will plan for scan next year]  Doctor Visits Discussed/Reviewed Doctor Visits Reviewed, PCP, Specialist  [8/19 PCP, 8/9 with vascular]  PCP/Specialist Visits Compliance with follow-up visit  Exercise Interventions   Exercise Discussed/Reviewed Weight Managment  Weight Management Weight maintenance  Education Interventions   Education Provided Provided Education  Provided Verbal Education On Medication, When to see the doctor  [Discussed monitoring weights, blood pressure, and heart rate on a regular basis]        Follow up plan: Follow up call scheduled for 10/11    Encounter Outcome:  Pt. Visit Completed   Kemper Durie, RN, MSN, [facility] Care Management Care Management Coordinator [phone]

## 2023-07-07 NOTE — Telephone Encounter (Signed)
Patient called and requested to have his Prolactin levels checked when he has his next lab appt in 3 months.

## 2023-07-08 ENCOUNTER — Other Ambulatory Visit (INDEPENDENT_AMBULATORY_CARE_PROVIDER_SITE_OTHER): Payer: Self-pay | Admitting: Nurse Practitioner

## 2023-07-08 ENCOUNTER — Ambulatory Visit (INDEPENDENT_AMBULATORY_CARE_PROVIDER_SITE_OTHER): Payer: Medicare HMO | Admitting: Dermatology

## 2023-07-08 ENCOUNTER — Encounter: Payer: Self-pay | Admitting: Dermatology

## 2023-07-08 VITALS — BP 120/82 | HR 60

## 2023-07-08 DIAGNOSIS — D229 Melanocytic nevi, unspecified: Secondary | ICD-10-CM

## 2023-07-08 DIAGNOSIS — L814 Other melanin hyperpigmentation: Secondary | ICD-10-CM

## 2023-07-08 DIAGNOSIS — L570 Actinic keratosis: Secondary | ICD-10-CM | POA: Diagnosis not present

## 2023-07-08 DIAGNOSIS — Z86007 Personal history of in-situ neoplasm of skin: Secondary | ICD-10-CM

## 2023-07-08 DIAGNOSIS — Z1283 Encounter for screening for malignant neoplasm of skin: Secondary | ICD-10-CM

## 2023-07-08 DIAGNOSIS — L810 Postinflammatory hyperpigmentation: Secondary | ICD-10-CM

## 2023-07-08 DIAGNOSIS — Z85828 Personal history of other malignant neoplasm of skin: Secondary | ICD-10-CM

## 2023-07-08 DIAGNOSIS — L578 Other skin changes due to chronic exposure to nonionizing radiation: Secondary | ICD-10-CM

## 2023-07-08 DIAGNOSIS — D1801 Hemangioma of skin and subcutaneous tissue: Secondary | ICD-10-CM

## 2023-07-08 DIAGNOSIS — L821 Other seborrheic keratosis: Secondary | ICD-10-CM

## 2023-07-08 DIAGNOSIS — Z872 Personal history of diseases of the skin and subcutaneous tissue: Secondary | ICD-10-CM

## 2023-07-08 DIAGNOSIS — S80811A Abrasion, right lower leg, initial encounter: Secondary | ICD-10-CM

## 2023-07-08 DIAGNOSIS — R202 Paresthesia of skin: Secondary | ICD-10-CM

## 2023-07-08 DIAGNOSIS — W908XXA Exposure to other nonionizing radiation, initial encounter: Secondary | ICD-10-CM

## 2023-07-08 NOTE — Telephone Encounter (Signed)
Left detailed message on VM.  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

## 2023-07-08 NOTE — Patient Instructions (Signed)
Cryotherapy Aftercare  Wash gently with soap and water everyday.   Apply Vaseline Jelly daily until healed.   Recommend daily broad spectrum sunscreen SPF 30+ to sun-exposed areas, reapply every 2 hours as needed. Call for new or changing lesions.  Staying in the shade or wearing long sleeves, sun glasses (UVA+UVB protection) and wide brim hats (4-inch brim around the entire circumference of the hat) are also recommended for sun protection.    Melanoma ABCDEs  Melanoma is the most dangerous type of skin cancer, and is the leading cause of death from skin disease.  You are more likely to develop melanoma if you: Have light-colored skin, light-colored eyes, or red or blond hair Spend a lot of time in the sun Tan regularly, either outdoors or in a tanning bed Have had blistering sunburns, especially during childhood Have a close family member who has had a melanoma Have atypical moles or large birthmarks  Early detection of melanoma is key since treatment is typically straightforward and cure rates are extremely high if we catch it early.   The first sign of melanoma is often a change in a mole or a new dark spot.  The ABCDE system is a way of remembering the signs of melanoma.  A for asymmetry:  The two halves do not match. B for border:  The edges of the growth are irregular. C for color:  A mixture of colors are present instead of an even brown color. D for diameter:  Melanomas are usually (but not always) greater than 6mm - the size of a pencil eraser. E for evolution:  The spot keeps changing in size, shape, and color.  Please check your skin once per month between visits. You can use a small mirror in front and a large mirror behind you to keep an eye on the back side or your body.   If you see any new or changing lesions before your next follow-up, please call to schedule a visit.  Please continue daily skin protection including broad spectrum sunscreen SPF 30+ to sun-exposed  areas, reapplying every 2 hours as needed when you're outdoors.   Staying in the shade or wearing long sleeves, sun glasses (UVA+UVB protection) and wide brim hats (4-inch brim around the entire circumference of the hat) are also recommended for sun protection.    Due to recent changes in healthcare laws, you may see results of your pathology and/or laboratory studies on MyChart before the doctors have had a chance to review them. We understand that in some cases there may be results that are confusing or concerning to you. Please understand that not all results are received at the same time and often the doctors may need to interpret multiple results in order to provide you with the best plan of care or course of treatment. Therefore, we ask that you please give [place] 2 business days to thoroughly review all your results before contacting the office for clarification. Should we see a critical lab result, you will be contacted sooner.   If You Need Anything After Your Visit  If you have any questions or concerns for your doctor, please call our main line at [phone] and press option 4 to reach your doctor's medical assistant. If no one answers, please leave a voicemail as directed and we will return your call as soon as possible. Messages left after 4 pm will be answered the following business day.   You may also send [place] a message via MyChart. We  typically respond to MyChart messages within 1-2 business days.  For prescription refills, please ask your pharmacy to contact our office. Our fax number is [phone].  If you have an urgent issue when the clinic is closed that cannot wait until the next business day, you can page your doctor at the number below.    Please note that while we do our best to be available for urgent issues outside of office hours, we are not available 24/7.   If you have an urgent issue and are unable to reach [place], you may choose to seek medical care at your doctor's  office, retail clinic, urgent care center, or emergency room.  If you have a medical emergency, please immediately call 911 or go to the emergency department.  Pager Numbers  - Dr. Gwen Pounds: [phone]  - Dr. Roseanne Reno: [phone]  In the event of inclement weather, please call our main line at [phone] for an update on the status of any delays or closures.  Dermatology Medication Tips: Please keep the boxes that topical medications come in in order to help keep track of the instructions about where and how to use these. Pharmacies typically print the medication instructions only on the boxes and not directly on the medication tubes.   If your medication is too expensive, please contact our office at [phone] option 4 or send [place] a message through MyChart.   We are unable to tell what your co-pay for medications will be in advance as this is different depending on your insurance coverage. However, we may be able to find a substitute medication at lower cost or fill out paperwork to get insurance to cover a needed medication.   If a prior authorization is required to get your medication covered by your insurance company, please allow [place] 1-2 business days to complete this process.  Drug prices often vary depending on where the prescription is filled and some pharmacies may offer cheaper prices.  The website www.goodrx.com contains coupons for medications through different pharmacies. The prices here do not account for what the cost may be with help from insurance (it may be cheaper with your insurance), but the website can give you the price if you did not use any insurance.  - You can print the associated coupon and take it with your prescription to the pharmacy.  - You may also stop by our office during regular business hours and pick up a GoodRx coupon card.  - If you need your prescription sent electronically to a different pharmacy, notify our office through [facility]  or by phone at [phone] option 4.     Si Usted Necesita Algo Despus de Su Visita  Tambin puede enviarnos un mensaje a travs de Clinical cytogeneticist. Por lo general respondemos a los mensajes de MyChart en el transcurso de 1 a 2 das hbiles.  Para renovar recetas, por favor pida a su farmacia que se ponga en contacto con nuestra oficina. Annie Sable de fax es [place] [phone].  Si tiene un asunto urgente cuando la clnica est cerrada y que no puede esperar hasta el siguiente da hbil, puede llamar/localizar a su doctor(a) al nmero que aparece a continuacin.   Por favor, tenga en cuenta que aunque hacemos todo lo posible para estar disponibles para asuntos urgentes fuera del horario de [place], no estamos disponibles las 24 horas del da, los 7 [address]  [address] de la [place].   Si tiene un problema urgente y no puede comunicarse con nosotros, puede optar por  buscar atencin mdica  en el consultorio de su doctor(a), en una clnica privada, en un centro de atencin urgente o en una sala de emergencias.  Si tiene Engineer, drilling, por favor llame inmediatamente al 911 o vaya a la sala de emergencias.  Nmeros de bper  - Dr. Gwen Pounds: [phone]  - Dra. Roseanne Reno: [phone]  En caso de inclemencias del [place], por favor llame a Lacy Duverney principal al [phone] para una actualizacin sobre el [place] de cualquier retraso o cierre.  Consejos para la medicacin en dermatologa: Por favor, guarde las cajas en las que vienen los medicamentos de uso tpico para ayudarle a seguir las instrucciones sobre dnde y cmo usarlos. Las farmacias generalmente imprimen las instrucciones del medicamento slo en las cajas y no directamente en los tubos del [place].   Si su medicamento es muy caro, por favor, pngase en contacto con Rolm Gala llamando al [phone] y presione la opcin 4 o envenos un mensaje a travs de Clinical cytogeneticist.   No podemos decirle cul ser su copago por los medicamentos  por adelantado ya que esto es diferente dependiendo de la cobertura de su seguro. Sin embargo, es posible que podamos encontrar un medicamento sustituto a Audiological scientist un formulario para que el seguro cubra el medicamento que se considera necesario.   Si se requiere una autorizacin previa para que su compaa de seguros [place] su medicamento, por favor permtanos de 1 a 2 das hbiles para completar [address]  Los precios de los medicamentos varan con frecuencia dependiendo del Environmental consultant de dnde se surte la receta y alguna farmacias pueden ofrecer precios ms baratos.  El sitio web www.goodrx.com tiene cupones para medicamentos de Health and safety inspector. Los precios aqu no tienen en cuenta lo que podra costar con la ayuda del seguro (puede ser ms barato con su seguro), pero el sitio web puede darle el precio si no utiliz Tourist information centre manager.  - Puede imprimir el cupn correspondiente y llevarlo con su receta a la farmacia.  - Tambin puede pasar por nuestra oficina durante el horario de atencin regular y Education officer, museum una tarjeta de cupones de GoodRx.  - Si necesita que su receta se enve electrnicamente a una farmacia diferente, informe a nuestra oficina a travs de MyChart de [place] o por telfono llamando al [phone] y presione la opcin 4.

## 2023-07-08 NOTE — Progress Notes (Signed)
Follow-Up Visit   Subjective  Russell Simpson is a 62 y.o. male who presents for the following: Skin Cancer Screening and Full Body Skin Exam. Hx of BCC. HxSCC. Hx Aks.  S/P 5FU-Calcipotriene treatment on arms prior to last appointment 04/08/2023. States he is not happy with the results, still has redness and irritation. Used Triamcinolone ointment after treatment.   The patient presents for Total-Body Skin Exam (TBSE) for skin cancer screening and mole check. The patient has spots, moles and lesions to be evaluated, some may be new or changing and the patient may have concern these could be cancer.     The following portions of the chart were reviewed this encounter and updated as appropriate: medications, allergies, medical history  Review of Systems:  No other skin or systemic complaints except as noted in HPI or Assessment and Plan.  Objective  Well appearing patient in no apparent distress; mood and affect are within normal limits.  A full examination was performed including scalp, head, eyes, ears, nose, lips, neck, chest, axillae, abdomen, back, buttocks, bilateral upper extremities, bilateral lower extremities, hands, feet, fingers, toes, fingernails, and toenails. All findings within normal limits unless otherwise noted below.   Relevant physical exam findings are noted in the Assessment and Plan.  R dorsal hand x1, right cheek x1, abdomen x1, R forearm x2, L dorsal hand x1, L forearm x3 (9) Erythematous thin papules/macules with gritty scale.     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 Multiple sites, see history.  - No evidence of recurrence today - No lymphadenopathy - Recommend regular full body skin exams - Recommend daily broad spectrum sunscreen SPF 30+ to sun-exposed areas, reapply every 2 hours as needed.  - Call if any new or changing lesions are noted between office visits  HISTORY OF BASAL CELL CARCINOMA OF THE  SKIN - No evidence of recurrence today - Recommend regular full body skin exams - Recommend daily broad spectrum sunscreen SPF 30+ to sun-exposed areas, reapply every 2 hours as needed.  - Call if any new or changing lesions are noted between office visits   SKIN CANCER SCREENING PERFORMED TODAY.  ACTINIC DAMAGE - Chronic condition, secondary to cumulative UV/sun exposure - diffuse scaly erythematous macules with underlying dyspigmentation - Recommend daily broad spectrum sunscreen SPF 30+ to sun-exposed areas, reapply every 2 hours as needed.  - Staying in the shade or wearing long sleeves, sun glasses (UVA+UVB protection) and wide brim hats (4-inch brim around the entire circumference of the hat) are also recommended for sun protection.  - Call for new or changing lesions.  LENTIGINES, SEBORRHEIC KERATOSES, HEMANGIOMAS - Benign normal skin lesions - Benign-appearing - Call for any changes  MELANOCYTIC NEVI - Tan-brown and/or pink-flesh-colored symmetric macules and papules - Benign appearing on exam today - Observation - Call clinic for new or changing moles - Recommend daily use of broad spectrum spf 30+ sunscreen to sun-exposed areas.    EXCORIATION Exam: ulcerated 8 mm papule with bland pink border at anterior right lower leg  Treatment Plan: Recommend vaseline. Call if not resolving. RTC 1 month if not resolved, plan biopsy.     Actinic keratosis treated with topical fluorouracil (5FU) (9) R dorsal hand x1, right cheek x1, abdomen x1, R forearm x2, L dorsal hand x1, L forearm x3  With post-inflammatory hyperpimentation (PIH)  Advised patient that the hyperpigmented areas will fade over time.   Destruction of lesion -  R dorsal hand x1, right cheek x1, abdomen x1, R forearm x2, L dorsal hand x1, L forearm x3 (9)  Destruction method: cryotherapy   Informed consent: discussed and consent obtained   Lesion destroyed using liquid nitrogen: Yes   Region frozen until ice  ball extended beyond lesion: Yes   Outcome: patient tolerated procedure well with no complications   Post-procedure details: wound care instructions given   Additional details:  Prior to procedure, discussed risks of blister formation, small wound, skin dyspigmentation, or rare scar following cryotherapy. Recommend Vaseline ointment to treated areas while healing.   Multiple benign nevi  Seborrheic keratoses  Cherry angioma  Lentigines  Actinic elastosis  Actinic keratoses   Return in about 6 months (around 01/08/2024) for AK Follow Up.  I, Lawson Radar, CMA, am acting as scribe for Elie Goody, MD.   Documentation: I have reviewed the above documentation for accuracy and completeness, and I agree with the above.  Elie Goody, MD

## 2023-07-09 ENCOUNTER — Ambulatory Visit (INDEPENDENT_AMBULATORY_CARE_PROVIDER_SITE_OTHER): Payer: Medicare HMO | Admitting: Nurse Practitioner

## 2023-07-09 ENCOUNTER — Ambulatory Visit (INDEPENDENT_AMBULATORY_CARE_PROVIDER_SITE_OTHER): Payer: Medicare HMO

## 2023-07-09 DIAGNOSIS — M79675 Pain in left toe(s): Secondary | ICD-10-CM

## 2023-07-09 DIAGNOSIS — I10 Essential (primary) hypertension: Secondary | ICD-10-CM | POA: Diagnosis not present

## 2023-07-09 DIAGNOSIS — M79674 Pain in right toe(s): Secondary | ICD-10-CM | POA: Diagnosis not present

## 2023-07-09 DIAGNOSIS — R202 Paresthesia of skin: Secondary | ICD-10-CM

## 2023-07-10 ENCOUNTER — Encounter (INDEPENDENT_AMBULATORY_CARE_PROVIDER_SITE_OTHER): Payer: Self-pay | Admitting: Nurse Practitioner

## 2023-07-10 NOTE — Progress Notes (Signed)
Subjective:    Patient ID: Russell Simpson, male    DOB: 06-01-1961, 62 y.o.   MRN: [id number] Chief Complaint  Patient presents with   Establish Care    Russell Simpson is a 62 year old male who presents today for evaluation of pain and paresthesias on the plantar surface of both feet.  The patient notes that it feels as if he has "body under his toes".  He notes that he has been.  He is walking with issues but is very nearly unbearable when he walks barefoot.  This has been ongoing for about the last 6 months or so.  He denies classic claudication-like symptoms, rest pain or any open wounds or ulcerations.  The patient has ABI of 1.13 on the right and 1.14 on the left.  The patient has triphasic tibial artery waveforms bilaterally with strong toe waveforms bilaterally.    Review of Systems  Cardiovascular:  Negative for leg swelling.  All other systems reviewed and are negative.      Objective:   Physical Exam Vitals reviewed.  HENT:     Head: Normocephalic.  Cardiovascular:     Rate and Rhythm: Normal rate.     Pulses:          Dorsalis pedis pulses are 2+ on the right side and 2+ on the left side.       Posterior tibial pulses are 1+ on the right side and 1+ on the left side.  Pulmonary:     Effort: Pulmonary effort is normal.  Musculoskeletal:        General: Tenderness present.  Skin:    General: Skin is warm and dry.  Neurological:     Mental Status: He is alert and oriented to person, place, and time.  Psychiatric:        Mood and Affect: Mood normal.        Behavior: Behavior normal.        Thought Content: Thought content normal.        Judgment: Judgment normal.     BP 108/79 (BP Location: Left Arm)   Pulse (!) 139   Resp 19   Ht 6\' 2"  (1.88 m)   Wt 242 lb 9.6 oz (110 kg)   BMI 31.15 kg/m   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HCC)    Arthritis    Asthma    Basal cell carcinoma 01/28/2021   R upper arm, EDC 03/04/2021    Basal cell carcinoma 12/31/2022   Right temporal hair line. Nodular. 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   left upper back ED&C done   Hypertension    Hyponatremia    Inappropriate sinus tachycardia    Non Hodgkin's lymphoma (HCC)  Pneumonia    RSV (respiratory syncytial virus infection)    SCC (squamous cell carcinoma) 08/28/2021   upper chest right of midline, EDC done 09/17/2021   SCC (squamous cell carcinoma) 09/30/2022   left chest  ED&C done   Sleep apnea    does not wear CPAP ; uses humidifier instead   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   right upper back, [facility] 06/18/2022   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   right upper back ED&C done   Squamous cell carcinoma of skin 02/19/2021   L inferior mandible, treated with EDC   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/28/2021   R ant shoulder - ED&C   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/28/2021   L upper abdomen - ED&C    Social History   Socioeconomic History   Marital status: Divorced    Spouse name: Not on file   Number of children: Not on file   Years of education: Not on file   Highest education level: Not on file  Occupational History   Not on file  Tobacco Use   Smoking status: Former    Types: Cigars    Quit date: 11/30/2022    Years since quitting: 0.6   Smokeless tobacco:  Never  Vaping Use   Vaping status: Former   Start date: 04/30/2017   Quit date: 11/30/2017  Substance and Sexual Activity   Alcohol use: Yes    Comment: beers 6 a day   Drug use: No   Sexual activity: Not Currently  Other Topics Concern   Not on file  Social History Narrative   Not on file   Social Determinants of Health   Financial Resource Strain: Medium Risk (04/05/2023)   Overall Financial Resource Strain (CARDIA)    Difficulty of Paying Living Expenses: Somewhat hard  Food Insecurity: Food Insecurity Present (05/07/2023)   Received from [facility], [facility]   Hunger Vital Sign    Worried About Running Out of Food in the Last Year: Sometimes true    Ran Out of Food in the Last Year: Sometimes true  Transportation Needs: No Transportation Needs (04/05/2023)   PRAPARE - Administrator, Civil Service (Medical): No    Lack of Transportation (Non-Medical): No  Physical Activity: Inactive (01/01/2023)   Exercise Vital Sign    Days of Exercise per Week: 0 days    Minutes of Exercise per Session: 0 min  Stress: No Stress Concern Present (04/05/2023)   [name] of Occupational Health - Occupational Stress Questionnaire    Feeling of Stress : Only a little  Social Connections: Socially Isolated (04/05/2023)   Social Connection and Isolation Panel [NHANES]    Frequency of Communication with Friends and Family: More than three times a week    Frequency of Social Gatherings with Friends and Family: Twice a week    Attends Religious Services: Never    Database administrator or Organizations: No    Attends Banker Meetings: Never    Marital Status: Divorced  Catering manager Violence: Not At Risk (04/05/2023)   Humiliation, Afraid, Rape, and Kick questionnaire    Fear of Current or Ex-Partner: No    Emotionally Abused: No    Physically Abused: No    Sexually Abused: No    Past Surgical History:  Procedure Laterality Date   ANTERIOR CERVICAL  DECOMP/DISCECTOMY FUSION N/A 06/17/2016   Procedure: ANTERIOR CERVICAL DECOMPRESSION FUSION, CERVICAL 3-4, CERVICAL  4-5 WITH INSTRUMENTATION AND ALLOGRAFT;  Surgeon: Estill Bamberg, MD;  Location: [facility] OR;  Service: Orthopedics;  Laterality: N/A;  ANTERIOR CERVICAL DECOMPRESSION FUSION, CERVICAL 3-4, CERVICAL 4-5 WITH INSTRUMENTATION AND ALLOGRAFT   BACK SURGERY     x3   HARDWARE REMOVAL Right 05/04/2023   Procedure: HARDWARE REMOVAL;  Surgeon: Juanell Fairly, MD;  Location: [facility] ORS;  Service: Orthopedics;  Laterality: Right;   NECK SURGERY  12/01/2007   ORIF ANKLE FRACTURE Right 12/02/2022   Procedure: OPEN REDUCTION INTERNAL FIXATION (ORIF) ANKLE FRACTURE;  Surgeon: Juanell Fairly, MD;  Location: [facility] ORS;  Service: Orthopedics;  Laterality: Right;   ORIF ANKLE FRACTURE Right 12/01/2022   Procedure: OPEN REDUCTION INTERNAL FIXATION (ORIF) ANKLE FRACTURE;  Surgeon: Juanell Fairly, MD;  Location: [facility] ORS;  Service: Orthopedics;  Laterality: Right;   PORTA CATH INSERTION N/A 04/03/2021   Procedure: PORTA CATH INSERTION;  Surgeon: Annice Needy, MD;  Location: [facility] INV[facility]IVE [facility];  Service: Cardiovascular;  Laterality: N/A;   VIDEO BRONCHOSCOPY WITH ENDOBRONCHIAL ULTRASOUND Bilateral 10/14/2022   Procedure: VIDEO BRONCHOSCOPY WITH ENDOBRONCHIAL ULTRASOUND;  Surgeon: Salena Saner, MD;  Location: [facility] ORS;  Service: Pulmonary;  Laterality: Bilateral;    Family History  Problem Relation Age of Onset   Diabetes Mother    Heart attack Father    Emphysema Father    Diabetes Other     Allergies  Allergen Reactions   Penicillins Anaphylaxis    Tolerates cefdinir, cephalexin and amoxicillin/clavulonate so true PCN allergy unlikely   Tetanus Toxoids Swelling   Lisinopril Cough   Losartan      Other reaction(s): Muscle Pain     Codeine Nausea Only and Nausea And Vomiting   Doxycycline Rash   Ruxience [Rituximab-Pvvr] Rash    05/06/21 pt w/ worsening rash during Ruxience infusion.  Face,  neck and chest flushing redness       Latest Ref Rng & Units 06/24/2023    2:23 PM 04/09/2023    3:04 PM 03/02/2023    1:32 PM  CBC  WBC 4.0 - 10.5 K/uL  8.3    Hemoglobin 13.0 - 17.7 g/dL 41.3  24.4    Hematocrit 37.5 - 51.0 % 39.9  44.3  45.7   Platelets 150 - 400 K/uL  105        CMP     Component Value Date/Time   NA 133 (L) 04/09/2023 1504   NA 139 12/30/2022 1335   NA 139 07/19/2013 0548   K 4.2 04/09/2023 1504   K 3.7 07/19/2013 0548   CL 97 (L) 04/09/2023 1504   CL 105 07/19/2013 0548   CO2 22 04/09/2023 1504   CO2 24 07/19/2013 0548   GLUCOSE 169 (H) 04/09/2023 1504   GLUCOSE 98 07/19/2013 0548   BUN 11 04/09/2023 1504   BUN 6 (L) 12/30/2022 1335   BUN 12 07/19/2013 0548   CREATININE 1.15 04/09/2023 1504   CREATININE 1.14 10/15/2017 0906   CALCIUM 8.8 (L) 04/09/2023 1504   CALCIUM 8.8 07/19/2013 0548   PROT 6.7 04/09/2023 1504   PROT 5.7 (L) 12/30/2022 1335   PROT 7.4 07/19/2013 0548   ALBUMIN 4.3 04/09/2023 1504   ALBUMIN 4.2 12/30/2022 1335   ALBUMIN 4.2 07/19/2013 0548   AST 36 04/09/2023 1504   AST 40 (H) 07/19/2013 0548   ALT 27 04/09/2023 1504   ALT 39 07/19/2013 0548   ALKPHOS 160 (H) 04/09/2023 1504   ALKPHOS 87 07/19/2013 0548   BILITOT 0.6 04/09/2023 1504  BILITOT 0.3 12/30/2022 1335   BILITOT 0.3 07/19/2013 0548   EGFR 102 12/30/2022 1335   GFRNONAA >60 04/09/2023 1504   GFRNONAA 72 10/15/2017 0906     No results found.     Assessment & Plan:   1. Toe pain, bilateral The patient has normal ABIs no evidence of significant peripheral arterial disease.  Based upon the description of pain and symptoms I suspect that he is either suffering from neuropathic pain or possible Morton's neuroma.  In either case we will send the patient to podiatry for further workup and evaluation.  Patient follow-up with [place] on an as-needed basis. - Ambulatory referral to Podiatry  2. Primary hypertension Continue antihypertensive medications as already  ordered, these medications have been reviewed and there are no changes at this time.   Current Outpatient Medications on File Prior to Visit  Medication Sig Dispense Refill   albuterol (VENTOLIN HFA) 108 (90 Base) MCG/ACT inhaler Inhale 2 puffs into the lungs every 6 (six) hours as needed for wheezing or shortness of breath. 18 g 6   amLODipine (NORVASC) 2.5 MG tablet Take 1 tablet (2.5 mg total) by mouth daily. 90 tablet 0   Blood Glucose Monitoring Suppl (ONE TOUCH ULTRA 2) w/Device KIT 1 each by Does not apply route daily. 1 kit 0   buPROPion (WELLBUTRIN SR) 150 MG 12 hr tablet Take 150 mg by mouth 2 (two) times daily.     calcipotriene (DOVONOX) 0.005 % cream Apply topically 2 (two) times daily. Apply twice a day after fluorouracil to affected areas as directed in handout. 60 g 1   cetirizine (ZYRTEC) 10 MG tablet Take 1 tablet (10 mg total) by mouth daily. 30 tablet 11   clonazePAM (KLONOPIN) 0.5 MG tablet Take 0.5 mg by mouth every evening.     DULoxetine (CYMBALTA) 60 MG capsule Take 60 mg by mouth every evening.     famotidine (PEPCID) 20 MG tablet Take 1 tablet (20 mg total) by mouth 2 (two) times daily. 180 tablet 1   finasteride (PROSCAR) 5 MG tablet TAKE 1 TABLET BY MOUTH ONCE DAILY 90 tablet 0   fluticasone (FLONASE) 50 MCG/ACT nasal spray Place 2 sprays into both nostrils daily. 16 g 6   Fluticasone-Umeclidin-Vilant (TRELEGY ELLIPTA) 200-62.5-25 MCG/ACT AEPB Inhale 1 puff into the lungs daily. 14 each 0   glucose blood (ONETOUCH ULTRA) test strip 1 each by Other route daily. Use as instructed 100 each 3   Lancets (ONETOUCH ULTRASOFT) lancets 1 each by Other route daily. Use as instructed 100 each 3   metoprolol succinate (TOPROL-XL) 100 MG 24 hr tablet TAKE 1 TABLET BY MOUTH ONCE DAILY WITH OR IMMEDIATELY FOLLOWING A MEAL 90 tablet 1   mirtazapine (REMERON) 15 MG tablet Take 15 mg by mouth at bedtime.     montelukast (SINGULAIR) 10 MG tablet Take 10 mg by mouth daily.      naltrexone (DEPADE) 50 MG tablet Take 50 mg by mouth daily.     OLANZapine (ZYPREXA) 5 MG tablet Take 5 mg by mouth at bedtime.     sildenafil (VIAGRA) 100 MG tablet Take 1 tablet (100 mg total) by mouth daily as needed for erectile dysfunction. Take two hours prior to intercourse on an empty stomach 30 tablet 3   tenofovir (VIREAD) 300 MG tablet TAKE 1 TABLET BY MOUTH ONCE DAILY 90 tablet 3   testosterone cypionate (DEPOTESTOSTERONE CYPIONATE) 200 MG/ML injection Inject 1 mL (200 mg total) into the muscle every 14 (fourteen)  days. 4 mL 0   traMADol (ULTRAM) 50 MG tablet Take 1 tablet (50 mg total) by mouth every 6 (six) hours as needed. 30 tablet 0   triamcinolone ointment (KENALOG) 0.1 % Apply to aa's arms BID up to two weeks. Avoid applying to face, groin, and axilla. Use as directed. Long-term use can cause thinning of the skin. 30 g 0   atorvastatin (LIPITOR) 80 MG tablet Take 1 tablet (80 mg total) by mouth daily. (Patient taking differently: Take 80 mg by mouth every evening.) 90 tablet 3   Current Facility-Administered Medications on File Prior to Visit  Medication Dose Route Frequency Provider Last Rate Last Admin   heparin lock flush 100 unit/mL  500 Units Intravenous Once Creig Hines, MD       heparin lock flush 100 unit/mL  500 Units Intravenous Once Creig Hines, MD       sodium chloride flush (NS) 0.9 % injection 10 mL  10 mL Intravenous PRN Creig Hines, MD        There are no Patient Instructions on file for this visit. No follow-ups on file.   Georgiana Spinner, NP

## 2023-07-13 DIAGNOSIS — S8261XA Displaced fracture of lateral malleolus of right fibula, initial encounter for closed fracture: Secondary | ICD-10-CM | POA: Diagnosis not present

## 2023-07-17 DIAGNOSIS — R7309 Other abnormal glucose: Secondary | ICD-10-CM | POA: Insufficient documentation

## 2023-07-17 NOTE — Patient Instructions (Signed)
Prediabetes Eating Plan Prediabetes is a condition that causes blood sugar (glucose) levels to be higher than normal. This increases the risk for developing type 2 diabetes (type 2 diabetes mellitus). Working with a health care provider or nutrition specialist (dietitian) to make diet and lifestyle changes can help prevent the onset of diabetes. These changes may help you: Control your blood glucose levels. Improve your cholesterol levels. Manage your blood pressure. What are tips for following this plan? Reading food labels Read food labels to check the amount of fat, salt (sodium), and sugar in prepackaged foods. Avoid foods that have: Saturated fats. Trans fats. Added sugars. Avoid foods that have more than 300 milligrams (mg) of sodium per serving. Limit your sodium intake to less than 2,300 mg each day. Shopping Avoid buying pre-made and processed foods. Avoid buying drinks with added sugar. Cooking Cook with olive oil. Do not use butter, lard, or ghee. Bake, broil, grill, steam, or boil foods. Avoid frying. Meal planning  Work with your dietitian to create an eating plan that is right for you. This may include tracking how many calories you take in each day. Use a food diary, notebook, or mobile application to track what you eat at each meal. Consider following a Mediterranean diet. This includes: Eating several servings of fresh fruits and vegetables each day. Eating fish at least twice a week. Eating one serving each day of whole grains, beans, nuts, and seeds. Using olive oil instead of other fats. Limiting alcohol. Limiting red meat. Using nonfat or low-fat dairy products. Consider following a plant-based diet. This includes dietary choices that focus on eating mostly vegetables and fruit, grains, beans, nuts, and seeds. If you have high blood pressure, you may need to limit your sodium intake or follow a diet such as the DASH (Dietary Approaches to Stop Hypertension) eating  plan. The DASH diet aims to lower high blood pressure. Lifestyle Set weight loss goals with help from your health care team. It is recommended that most people with prediabetes lose 7% of their body weight. Exercise for at least 30 minutes 5 or more days a week. Attend a support group or seek support from a mental health counselor. Take over-the-counter and prescription medicines only as told by your health care provider. What foods are recommended? Fruits Berries. Bananas. Apples. Oranges. Grapes. Papaya. Mango. Pomegranate. Kiwi. Grapefruit. Cherries. Vegetables Lettuce. Spinach. Peas. Beets. Cauliflower. Cabbage. Broccoli. Carrots. Tomatoes. Squash. Eggplant. Herbs. Peppers. Onions. Cucumbers. Brussels sprouts. Grains Whole grains, such as whole-wheat or whole-grain breads, crackers, cereals, and pasta. Unsweetened oatmeal. Bulgur. Barley. Quinoa. Brown rice. Corn or whole-wheat flour tortillas or taco shells. Meats and other proteins Seafood. Poultry without skin. Lean cuts of pork and beef. Tofu. Eggs. Nuts. Beans. Dairy Low-fat or fat-free dairy products, such as yogurt, cottage cheese, and cheese. Beverages Water. Tea. Coffee. Sugar-free or diet soda. Seltzer water. Low-fat or nonfat milk. Milk alternatives, such as soy or almond milk. Fats and oils Olive oil. Canola oil. Sunflower oil. Grapeseed oil. Avocado. Walnuts. Sweets and desserts Sugar-free or low-fat pudding. Sugar-free or low-fat ice cream and other frozen treats. Seasonings and condiments Herbs. Sodium-free spices. Mustard. Relish. Low-salt, low-sugar ketchup. Low-salt, low-sugar barbecue sauce. Low-fat or fat-free mayonnaise. The items listed above may not be a complete list of recommended foods and beverages. Contact a dietitian for more information. What foods are not recommended? Fruits Fruits canned with syrup. Vegetables Canned vegetables. Frozen vegetables with butter or cream sauce. Grains Refined white  flour and flour   products, such as bread, pasta, snack foods, and cereals. Meats and other proteins Fatty cuts of meat. Poultry with skin. Breaded or fried meat. Processed meats. Dairy Full-fat yogurt, cheese, or milk. Beverages Sweetened drinks, such as iced tea and soda. Fats and oils Butter. Lard. Ghee. Sweets and desserts Baked goods, such as cake, cupcakes, pastries, cookies, and cheesecake. Seasonings and condiments Spice mixes with added salt. Ketchup. Barbecue sauce. Mayonnaise. The items listed above may not be a complete list of foods and beverages that are not recommended. Contact a dietitian for more information. Where to find more information American Diabetes Association: www.diabetes.org Summary You may need to make diet and lifestyle changes to help prevent the onset of diabetes. These changes can help you control blood sugar, improve cholesterol levels, and manage blood pressure. Set weight loss goals with help from your health care team. It is recommended that most people with prediabetes lose 7% of their body weight. Consider following a Mediterranean diet. This includes eating plenty of fresh fruits and vegetables, whole grains, beans, nuts, seeds, fish, and low-fat dairy, and using olive oil instead of other fats. This information is not intended to replace advice given to you by your health care provider. Make sure you discuss any questions you have with your health care provider. Document Revised: 02/15/2020 Document Reviewed: 02/15/2020 Elsevier Patient Education  2024 Elsevier Inc.  

## 2023-07-19 ENCOUNTER — Encounter: Payer: Self-pay | Admitting: Nurse Practitioner

## 2023-07-19 ENCOUNTER — Ambulatory Visit (INDEPENDENT_AMBULATORY_CARE_PROVIDER_SITE_OTHER): Payer: Medicare HMO | Admitting: Nurse Practitioner

## 2023-07-19 VITALS — BP 129/78 | HR 89 | Temp 97.6°F | Ht 74.0 in | Wt 247.6 lb

## 2023-07-19 DIAGNOSIS — Z87891 Personal history of nicotine dependence: Secondary | ICD-10-CM

## 2023-07-19 DIAGNOSIS — E782 Mixed hyperlipidemia: Secondary | ICD-10-CM

## 2023-07-19 DIAGNOSIS — R7309 Other abnormal glucose: Secondary | ICD-10-CM

## 2023-07-19 DIAGNOSIS — F3160 Bipolar disorder, current episode mixed, unspecified: Secondary | ICD-10-CM | POA: Diagnosis not present

## 2023-07-19 DIAGNOSIS — I10 Essential (primary) hypertension: Secondary | ICD-10-CM | POA: Diagnosis not present

## 2023-07-19 DIAGNOSIS — E039 Hypothyroidism, unspecified: Secondary | ICD-10-CM

## 2023-07-19 DIAGNOSIS — E669 Obesity, unspecified: Secondary | ICD-10-CM | POA: Diagnosis not present

## 2023-07-19 DIAGNOSIS — C8202 Follicular lymphoma grade I, intrathoracic lymph nodes: Secondary | ICD-10-CM

## 2023-07-19 DIAGNOSIS — J449 Chronic obstructive pulmonary disease, unspecified: Secondary | ICD-10-CM

## 2023-07-19 DIAGNOSIS — I5032 Chronic diastolic (congestive) heart failure: Secondary | ICD-10-CM

## 2023-07-19 NOTE — Assessment & Plan Note (Signed)
Chronic, ongoing.  Not a current smoker.  Continue collaboration with pulmonary, appreciate their input.  Referral to [facility] pharmacist to assist with cost of Trelegy -- he is not taking due to cost.  Continue Albuterol as needed at this time.

## 2023-07-19 NOTE — Assessment & Plan Note (Signed)
Chronic, ongoing.  Followed by psychiatry.  Continue this collaboration.  Recent notes reviewed.  Denies SI/HI.

## 2023-07-19 NOTE — Assessment & Plan Note (Signed)
History of elevation on labs in January 2024, 6.2%.  Recheck today and initiate medication as needed.  For now continue focus on diet and regular activity.

## 2023-07-19 NOTE — Progress Notes (Signed)
BP 129/78   Pulse 89   Temp 97.6 F (36.4 C) (Oral)   Ht 6\' 2"  (1.88 m)   Wt 247 lb 9.6 oz (112.3 kg)   SpO2 93%   BMI 31.79 kg/m    Subjective:    Patient ID: Russell Simpson, male    DOB: 04/24/1961, 62 y.o.   MRN: [id number]  HPI: Russell Simpson is a 62 y.o. male  Chief Complaint  Patient presents with   Depression   Hyperlipidemia   Hypertension   COPD   HYPERTENSION / HYPERLIPIDEMIA Continues on Amlodipine, Metoprolol, and Atorvastatin.  Has history of elevations in sugars in past.   A1c in January 2024 was 6.2%.  Satisfied with current treatment? yes Duration of hypertension: chronic BP monitoring frequency: not checking BP range:  BP medication side effects: no Duration of hyperlipidemia: chronic Cholesterol medication side effects: no Cholesterol supplements: none Medication compliance: good compliance Aspirin: no Recent stressors:  Recurrent headaches: no Visual changes: no Palpitations: no Dyspnea: no Chest pain: no Lower extremity edema: no Dizzy/lightheaded: no   COPD Follows with pulmonary, last visit 01/28/23.  Is not using Trelegy, cannot afford.  Has Albuterol to use as needed.  Has follicular lymphoma, followed by oncology with last visit 06/25/23. Needs repeat scan, but can not afford it.  Has been through chemo.  Still has port in place.  COPD status: stable Satisfied with current treatment?: yes Oxygen use: no Dyspnea frequency: no Cough frequency: no Rescue inhaler frequency:  never Limitation of activity: no Productive cough: none Last Spirometry: with pulmonary Pneumovax: Up to Date Influenza: Up to Date   BIPOLAR DISORDER Continues to follow with psychiatry, last visit 05/28/23.  No changes at that time.  Did have visit with endocrinology back in May 2023, had TSH check at time which was elevated and was to be rechecked, this was missed by patient.   Mood status: stable Satisfied with current treatment?: yes Symptom severity: mild   Duration of current treatment : chronic Side effects: no Medication compliance: good compliance Psychotherapy/counseling: no Depressed mood: no Anxious mood: no Anhedonia: no Significant weight loss or gain: no Insomnia: no Fatigue: yes Feelings of worthlessness or guilt: no Impaired concentration/indecisiveness: no Suicidal ideations: no Hopelessness: no Crying spells: no    07/19/2023    1:34 PM 06/10/2023    4:12 PM 04/05/2023    3:35 PM 03/08/2023    4:27 PM 02/01/2023    1:58 PM  Depression screen PHQ 2/9  Decreased Interest 1 1 1 2  0  Down, Depressed, Hopeless 1 1 1 2 1   PHQ - 2 Score 2 2 2 4 1   Altered sleeping 1 1 1  0 3  Tired, decreased energy 1 1 1 2 3   Change in appetite 0 1 1 0 0  Feeling bad or failure about yourself  0 1 0 2 0  Trouble concentrating 0 0 0 3 0  Moving slowly or fidgety/restless 0 0 0 0 0  Suicidal thoughts 0 0 0 0 0  PHQ-9 Score 4 6 5 11 7   Difficult doing work/chores Very difficult Not difficult at all Somewhat difficult Very difficult Somewhat difficult       07/19/2023    1:34 PM 06/10/2023    4:12 PM 03/08/2023    4:27 PM 02/01/2023    1:59 PM  GAD 7 : Generalized Anxiety Score  Nervous, Anxious, on Edge 1 0 1 0  Control/stop worrying 1 0 1 1  Worry too much -  different things 1 0 1 1  Trouble relaxing 1 0 1 1  Restless 1 0 0 0  Easily annoyed or irritable 0 0 0 0  Afraid - awful might happen 1 0 0 0  Total GAD 7 Score 6 0 4 3  Anxiety Difficulty Somewhat difficult Not difficult at all Very difficult Not difficult at all      Relevant past medical, surgical, family and social history reviewed and updated as indicated. Interim medical history since our last visit reviewed. Allergies and medications reviewed and updated.  Review of Systems  Per HPI unless specifically indicated above     Objective:    BP 129/78   Pulse 89   Temp 97.6 F (36.4 C) (Oral)   Ht 6\' 2"  (1.88 m)   Wt 247 lb 9.6 oz (112.3 kg)   SpO2 93%   BMI 31.79  kg/m   Wt Readings from Last 3 Encounters:  07/19/23 247 lb 9.6 oz (112.3 kg)  07/09/23 242 lb 9.6 oz (110 kg)  06/25/23 237 lb 14.4 oz (107.9 kg)    Physical Exam  Results for orders placed or performed in visit on 07/09/23  VAS [place] ABI WITH/WO TBI  Result Value Ref Range   Right ABI 1.13    Left ABI 1.14       Assessment & Plan:   Problem List Items Addressed This Visit       Cardiovascular and Mediastinum   Essential hypertension    Chronic, ongoing.  BP at goal today in office.  Recommend he monitor BP at least a few mornings a week at home and document.  DASH diet at home.  Continue current medication regimen and adjust as needed.  Labs today: up to date with oncology.  Refills as needed.           Respiratory   Stage 2 moderate COPD by GOLD classification (HCC)    Chronic, ongoing.  Not a current smoker.  Continue collaboration with pulmonary, appreciate their input.  Referral to [facility] pharmacist to assist with cost of Trelegy -- he is not taking due to cost.  Continue Albuterol as needed at this time.      Relevant Orders   Amb Referral to Clinical Pharmacist     Endocrine   Hypothyroidism    Noted on labs May 2023 when he saw endocrinology.  He did not return for recheck or visit in 3 months as recommended.  Will recheck in office today and initiate medication as needed based on results.  They did not start medication at that time, as had wanted to recheck first.        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  Followed by oncology.  Continue this collaboration.  Recent notes and labs reviewed.        Other   Elevated hemoglobin A1c measurement    History of elevation on labs in January 2024, 6.2%.  Recheck today and initiate medication as needed.  For now continue focus on diet and regular activity.      Relevant  Orders   HgB A1c   History of smoking    Recommend continued cessation of smoking.      Hyperlipidemia   Relevant Orders   Lipid Panel w/o Chol/HDL Ratio   Mixed bipolar  I disorder (HCC)    Chronic, ongoing.  Followed by psychiatry.  Continue this collaboration.  Recent notes reviewed.  Denies SI/HI.      Obesity with body mass index (BMI) of 30.0 to 39.9    BMI 31.79.  Recommended eating smaller high protein, low fat meals more frequently and exercising 30 mins a day 5 times a week with a goal of 10-15lb weight loss in the next 3 months. Patient voiced their understanding and motivation to adhere to these recommendations.         Follow up plan: Return in about 6 months (around 01/19/2024) for HTN/HLD, COPD, MOOD, CANCER, THYROID -- with PCP.

## 2023-07-19 NOTE — Assessment & Plan Note (Signed)
Noted on labs May 2023 when he saw endocrinology.  He did not return for recheck or visit in 3 months as recommended.  Will recheck in office today and initiate medication as needed based on results.  They did not start medication at that time, as had wanted to recheck first.

## 2023-07-19 NOTE — Assessment & Plan Note (Signed)
Chronic, ongoing.  BP at goal today in office.  Recommend he monitor BP at least a few mornings a week at home and document.  DASH diet at home.  Continue current medication regimen and adjust as needed.  Labs today: up to date with oncology.  Refills as needed.

## 2023-07-19 NOTE — Assessment & Plan Note (Signed)
Recommend continued cessation of smoking. 

## 2023-07-19 NOTE — Assessment & Plan Note (Signed)
BMI 31.79.  Recommended eating smaller high protein, low fat meals more frequently and exercising 30 mins a day 5 times a week with a goal of 10-15lb weight loss in the next 3 months. Patient voiced their understanding and motivation to adhere to these recommendations.  

## 2023-07-19 NOTE — Assessment & Plan Note (Signed)
Chronic, ongoing.  Followed by oncology.  Continue this collaboration.  Recent notes and labs reviewed.

## 2023-07-20 ENCOUNTER — Other Ambulatory Visit: Payer: Self-pay | Admitting: Nurse Practitioner

## 2023-07-20 ENCOUNTER — Other Ambulatory Visit: Payer: Self-pay | Admitting: *Deleted

## 2023-07-20 DIAGNOSIS — C8202 Follicular lymphoma grade I, intrathoracic lymph nodes: Secondary | ICD-10-CM

## 2023-07-20 LAB — HEMOGLOBIN A1C
Est. average glucose Bld gHb Est-mCnc: 114 mg/dL
Hgb A1c MFr Bld: 5.6 % (ref 4.8–5.6)

## 2023-07-20 LAB — LIPID PANEL W/O CHOL/HDL RATIO
Cholesterol, Total: 146 mg/dL (ref 100–199)
HDL: 33 mg/dL — ABNORMAL LOW (ref >39)
LDL Chol Calc (NIH): 94 mg/dL (ref 0–99)
Triglycerides: 103 mg/dL (ref 0–149)
VLDL Cholesterol Cal: 19 mg/dL (ref 5–40)

## 2023-07-20 LAB — THYROID PEROXIDASE ANTIBODY: Thyroperoxidase Ab SerPl-aCnc: <9 [IU]/mL (ref 0–34)

## 2023-07-20 LAB — T4, FREE: Free T4: 1.09 ng/dL (ref 0.82–1.77)

## 2023-07-20 LAB — TSH: TSH: 2.9 u[IU]/mL (ref 0.450–4.500)

## 2023-07-20 NOTE — Progress Notes (Signed)
Contacted via MyChart   Good morning Russell Simpson, your labs have returned: - Thyroid labs this check are normal.  We do not need to start medication and we can continue to monitor levels. - A1c has trended down and out of prediabetes range!!  Woohoo!!  Keep up the good work. - Cholesterol levels remain stable.  Continue statin therapy. Keep being awesome!!  Thank you for allowing me to participate in your care.  I appreciate you. Kindest regards, Daylah Sayavong

## 2023-07-21 ENCOUNTER — Inpatient Hospital Stay: Payer: Medicare HMO

## 2023-07-21 ENCOUNTER — Inpatient Hospital Stay: Payer: Medicare HMO | Attending: Oncology | Admitting: Oncology

## 2023-07-21 ENCOUNTER — Telehealth: Payer: Self-pay | Admitting: Oncology

## 2023-07-21 NOTE — Telephone Encounter (Signed)
Russell Simpson called and got our answering service. He wants to cancel his appt @2pm  today 07/21/2023 for lab/ Dr Smith Marina.

## 2023-07-21 NOTE — Telephone Encounter (Signed)
Requested Prescriptions  Pending Prescriptions Disp Refills   metoprolol succinate (TOPROL-XL) 100 MG 24 hr tablet [Pharmacy Med Name: METOPROLOL SUCCINATE ER 100 MG TAB] 90 tablet 0    Sig: TAKE 1 TABLET BY MOUTH ONCE DAILY WITH OR IMMEDIATELY FOLLOWING A MEAL     Cardiovascular:  Beta Blockers Passed - 07/20/2023 12:32 PM      Passed - Last BP in normal range    BP Readings from Last 1 Encounters:  07/19/23 129/78         Passed - Last Heart Rate in normal range    Pulse Readings from Last 1 Encounters:  07/19/23 89         Passed - Valid encounter within last 6 months    Recent Outpatient Visits           2 days ago 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)   [place] [facility] [place], Corrie Dandy T, NP   1 month ago Paresthesia of foot, bilateral   [place] [facility] Bo[place], Sherran Needs, NP   4 months ago Seasonal allergic rhinitis due to pollen   [facility] [name], NP   5 months ago Brief loss of consciousness   [facility] [name], NP   6 months ago Brief loss of consciousness   [place] [facility] [name], NP       Future Appointments             In 1 week McDonald, Rachelle Hora, DPM [facility] [place]   In 2 months McGowan, Elana Alm [facility] [place]   In 6 months Elie Goody, MD [facility]   In 6 months [name], NP [facility]

## 2023-07-22 ENCOUNTER — Telehealth: Payer: Self-pay

## 2023-07-22 NOTE — Progress Notes (Signed)
   Care Guide Note  07/22/2023 Name: Navarre Bieler MRN: [id number] DOB: 1961-01-31  Referred by: [name], NP Reason for referral : Care Management (Outreach to schedule with pharm d )   Mekiah Cranmer is a 62 y.o. year old male who is a primary care patient of [name], NP. Taisei Syzdek was referred to the pharmacist for assistance related to HLD.    An unsuccessful telephone outreach was attempted today to contact the patient who was referred to the pharmacy team for assistance with medication management. Additional attempts will be made to contact the patient.   Penne Lash, RMA Care Guide [facility]  [place], [place] [address] Direct Dial: [phone] [email or web address]

## 2023-07-24 ENCOUNTER — Other Ambulatory Visit: Payer: Self-pay | Admitting: Urology

## 2023-07-24 DIAGNOSIS — E291 Testicular hypofunction: Secondary | ICD-10-CM

## 2023-07-28 ENCOUNTER — Encounter: Payer: Self-pay | Admitting: Podiatry

## 2023-07-28 ENCOUNTER — Ambulatory Visit (INDEPENDENT_AMBULATORY_CARE_PROVIDER_SITE_OTHER): Payer: Medicare HMO | Admitting: Podiatry

## 2023-07-28 DIAGNOSIS — G629 Polyneuropathy, unspecified: Secondary | ICD-10-CM

## 2023-07-29 ENCOUNTER — Encounter: Payer: Self-pay | Admitting: Podiatry

## 2023-07-29 NOTE — Progress Notes (Signed)
  Subjective:  Patient ID: Russell Simpson, male    DOB: 11-Mar-1961,  MRN: [id number]  Chief Complaint  Patient presents with   Foot Pain    "Up under my toes, it feels like puddy underneath them.  I don't know if it's Neuropathy or what." N - strange feeling under toes L - bilateral 1-5  D - 3 mos.[place] - suddenly, about the same C - feels like puddy A - none T - none    62 y.o. male presents with the above complaint. History confirmed with patient.  Had chemotherapy that was finished last year.  Objective:  Physical Exam: warm, good capillary refill, no trophic changes or ulcerative lesions, normal DP and PT pulses, and diffuse plantar forefoot polyneuropathy  Assessment:   1. Neuropathy      Plan:  Patient was evaluated and treated and all questions answered.  We discussed possible etiologies of peripheral neuropathy including alcohol use, vitamin deficiency and chemotherapy.  I discussed with him that unfortunately most of these likely will not have a complete cure but there are medications that can help to alleviate pain although he only feels numbness primarily.  I did recommend to check his vitamin B12 and folate level.  His PCP can supplement pending results of this.  Otherwise may be permanent.  Return if symptoms worsen or fail to improve.

## 2023-08-05 NOTE — Progress Notes (Signed)
   Care Guide Note  08/05/2023 Name: Russell Simpson MRN: [id number] DOB: Apr 12, 1961  Referred by: [name], NP Reason for referral : Care Management (Outreach to schedule with pharm d )   Russell Simpson is a 62 y.o. year old male who is a primary care patient of [name], NP. Russell Simpson was referred to the pharmacist for assistance related to HLD.    A second unsuccessful telephone outreach was attempted today to contact the patient who was referred to the pharmacy team for assistance with medication management. Additional attempts will be made to contact the patient.  Penne Lash, RMA Care Guide [facility]  [place], [place] [address] Direct Dial: [phone] [email or web address]

## 2023-08-06 ENCOUNTER — Other Ambulatory Visit: Payer: Self-pay

## 2023-08-06 MED ORDER — ATORVASTATIN CALCIUM 80 MG PO TABS: 80.0000 mg | ORAL_TABLET | Freq: Every day | ORAL | 0 refills | Status: DC

## 2023-08-07 LAB — B12 AND FOLATE PANEL
Folate: 14.2 ng/mL (ref >3.0)
Vitamin B-12: 341 pg/mL (ref 232–1245)

## 2023-08-09 NOTE — Progress Notes (Signed)
   Care Guide Note  08/09/2023 Name: Russell Simpson MRN: [id number] DOB: 08/12/1961  Referred by: [name], NP Reason for referral : Care Management (Outreach to schedule with pharm d )   Russell Simpson is a 62 y.o. year old male who is a primary care patient of [name], NP. Russell Simpson was referred to the pharmacist for assistance related to HTN and HLD.    A third unsuccessful telephone outreach was attempted today to contact the patient who was referred to the pharmacy team for assistance with medication assistance. The Population Health team is pleased to engage with this patient at any time in the future upon receipt of referral and should he/she be interested in assistance from the [facility] team.   Penne Lash, RMA Care Guide [facility]  [place], [place] [address] Direct Dial: [phone] [email or web address]

## 2023-08-16 ENCOUNTER — Inpatient Hospital Stay: Payer: Medicare HMO | Attending: Oncology | Admitting: Oncology

## 2023-08-16 ENCOUNTER — Other Ambulatory Visit: Payer: Medicare HMO

## 2023-08-16 ENCOUNTER — Inpatient Hospital Stay: Payer: Medicare HMO

## 2023-08-16 ENCOUNTER — Encounter: Payer: Self-pay | Admitting: Oncology

## 2023-08-16 VITALS — BP 119/81 | HR 110 | Temp 97.7°F | Resp 16 | Ht 74.0 in | Wt 242.6 lb

## 2023-08-16 DIAGNOSIS — C8202 Follicular lymphoma grade I, intrathoracic lymph nodes: Secondary | ICD-10-CM

## 2023-08-16 LAB — CBC WITH DIFFERENTIAL/PLATELET
Abs Immature Granulocytes: 0.06 10*3/uL (ref 0.00–0.07)
Basophils Absolute: 0 10*3/uL (ref 0.0–0.1)
Basophils Relative: 0 %
Eosinophils Absolute: 0.1 10*3/uL (ref 0.0–0.5)
Eosinophils Relative: 1 %
HCT: 39.3 % (ref 39.0–52.0)
Hemoglobin: 13.6 g/dL (ref 13.0–17.0)
Immature Granulocytes: 1 %
Lymphocytes Relative: 51 %
Lymphs Abs: 5.6 10*3/uL — ABNORMAL HIGH (ref 0.7–4.0)
MCH: 31.6 pg (ref 26.0–34.0)
MCHC: 34.6 g/dL (ref 30.0–36.0)
MCV: 91.2 fL (ref 80.0–100.0)
Monocytes Absolute: 1 10*3/uL (ref 0.1–1.0)
Monocytes Relative: 9 %
Neutro Abs: 4.2 10*3/uL (ref 1.7–7.7)
Neutrophils Relative %: 38 %
Platelets: 110 10*3/uL — ABNORMAL LOW (ref 150–400)
RBC: 4.31 MIL/uL (ref 4.22–5.81)
RDW: 11.9 % (ref 11.5–15.5)
Smear Review: NORMAL
WBC: 11 10*3/uL — ABNORMAL HIGH (ref 4.0–10.5)
nRBC: 0 % (ref 0.0–0.2)

## 2023-08-16 LAB — COMPREHENSIVE METABOLIC PANEL
ALT: 29 U/L (ref 0–44)
AST: 34 U/L (ref 15–41)
Albumin: 4.4 g/dL (ref 3.5–5.0)
Alkaline Phosphatase: 175 U/L — ABNORMAL HIGH (ref 38–126)
Anion gap: 12 (ref 5–15)
BUN: 9 mg/dL (ref 8–23)
CO2: 21 mmol/L — ABNORMAL LOW (ref 22–32)
Calcium: 8.6 mg/dL — ABNORMAL LOW (ref 8.9–10.3)
Chloride: 97 mmol/L — ABNORMAL LOW (ref 98–111)
Creatinine, Ser: 1.19 mg/dL (ref 0.61–1.24)
GFR, Estimated: >60 mL/min (ref >60)
Glucose, Bld: 132 mg/dL — ABNORMAL HIGH (ref 70–99)
Potassium: 4.1 mmol/L (ref 3.5–5.1)
Sodium: 130 mmol/L — ABNORMAL LOW (ref 135–145)
Total Bilirubin: 0.8 mg/dL (ref 0.3–1.2)
Total Protein: 6.8 g/dL (ref 6.5–8.1)

## 2023-08-16 NOTE — Progress Notes (Signed)
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 [name]  He sustained a left anterior frontal sinus as well as supraorbital rim fracture on 11/21/2019 which was managed conservatively.  He then presented to the ER at [facility] with symptoms of right-sided chest wall pain this led to a CT PE which did not show any evidence of pulmonary embolism.  He was noted to have bilateral symmetric axillary adenopathy measuring up to 1.8 cm.  Scattered mediastinal adenopathy.  Right paratracheal node measuring up to 1.2 cm.  Prominent right costophrenic lymph node measuring up to 1 cm.  Findings are nonspecific but could be seen with lymphoma versus systemic inflammatory disease such as sarcoidosis.  Of note patient has had a prior CT scan for lung screening back in 2015 when he was not noted to have this adenopathy.    Repeat CT chest in August 2021 showed persistent mild nonbulky axillary and mediastinal adenopathy which had not changed significantly as compared to his prior CT in December 2020.  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   Repeat CT in March  2022 showed Progression and multistation adenopathy as well as significant splenomegaly measuring 20.4 cm as compared to 14.5 cm on CT scan September 2021.  CT scan showed multistation adenopathy with SUVs ranging between 6-8.9.  Patient underwent another core biopsy of right inguinal lymph node which was consistent with follicular lymphoma grade 1-2.  Bone marrow biopsy also showed moderate involvement with non-Hodgkin's B-cell lymphoma.  Baseline hepatitis B testing showed core antibody positivity.  Seen by GI and started on tenofovir   Patient had symptoms of nausea and sweating during cycle 1 of Rituxan which resolved with additional premedications.  He developed symptoms of itching over neck chest back and bilateral eyes with second dose of Rituxan early and had to get more premedications and was not rechallenged on the same day.  Patient subsequently received cycle 3 of Bendamustine Rituxan chemotherapy after Rituxan was given as a split dose over 3 days   Scans after 6 cycles of Bendamustine Rituxan chemotherapy in October 2022 showed significant improvement with therapy.  Lymphadenopathy in his neck and mediastinum resolved Deauville 2.  Decrease in the splenic size and hypermetabolism.  No new lesions.  FDG accumulation within the skeleton presumably due to stimulatory effects of treatment.   Scans in April 2023 showed pulmonary nodules requiring further assessment and patient was seen by pulmonary.  He also had a PET CT scan in October 2023 which showed gradual enlargement and hypermetabolic some noted in the lung nodules.  Patient also has intra-abdominal lymph nodes ranging from 9 to 1.2 cm with an SUV less than 3.  Patient had right lower lobe lung biopsy which was negative for  malignancy.    Interval history-patient was recently arrested for DUI and recently released.  He reports ongoing fatigue.  Also has worsening neuropathy in his feet.  He continues to drink about 12 beers per day.  ECOG  PS- 1 Pain scale- 0   Review of systems- Review of Systems  Constitutional:  Positive for malaise/fatigue. Negative for chills, fever and weight loss.  HENT:  Negative for congestion, ear discharge and nosebleeds.   Eyes:  Negative for blurred vision.  Respiratory:  Negative for cough, hemoptysis, sputum production, shortness of breath and wheezing.   Cardiovascular:  Negative for chest pain, palpitations, orthopnea and claudication.  Gastrointestinal:  Negative for abdominal pain, blood in stool, constipation, diarrhea, heartburn, melena, nausea and vomiting.  Genitourinary:  Negative for dysuria, flank pain, frequency, hematuria and urgency.  Musculoskeletal:  Negative for back pain, joint pain and myalgias.  Skin:  Negative for rash.  Neurological:  Negative for dizziness, tingling, focal weakness, seizures, weakness and headaches.  Endo/Heme/Allergies:  Does not bruise/bleed easily.  Psychiatric/Behavioral:  Negative for depression and suicidal ideas. The patient does not have insomnia.       Allergies  Allergen Reactions   Penicillins Anaphylaxis    Tolerates cefdinir, cephalexin and amoxicillin/clavulonate so true PCN allergy unlikely   Tetanus Toxoids Swelling   Lisinopril Cough   Losartan      Other reaction(s): Muscle Pain     Codeine Nausea Only and Nausea And Vomiting   Doxycycline Rash   Ruxience [Rituximab-Pvvr] Rash    05/06/21 pt w/ worsening rash during Ruxience infusion.  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HCC)    Arthritis    Asthma    Basal cell carcinoma 01/28/2021   R upper arm, EDC 03/04/2021   Basal cell carcinoma 12/31/2022   Right temporal hair line. Nodular. 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   left upper back ED&C done   Hypertension    Hyponatremia    Inappropriate sinus tachycardia    Non Hodgkin's lymphoma (HCC)    Pneumonia    RSV (respiratory syncytial virus infection)    SCC (squamous cell carcinoma) 08/28/2021   upper chest right of midline, EDC done 09/17/2021   SCC (squamous cell carcinoma) 09/30/2022   left chest  ED&C done   Sleep apnea    does not wear CPAP ; uses humidifier instead   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   right upper back, [facility] 06/18/2022   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   right upper back ED&C done   Squamous cell carcinoma of skin 02/19/2021   L inferior mandible, treated with EDC   Squamous cell carcinoma of  skin 08/28/2021   R ant shoulder - ED&C   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/28/2021   L upper abdomen - ED&C     Past Surgical History:  Procedure Laterality Date   ANTERIOR CERVICAL DECOMP/DISCECTOMY FUSION N/A 06/17/2016   Procedure: ANTERIOR CERVICAL DECOMPRESSION FUSION, CERVICAL 3-4, CERVICAL 4-5 WITH INSTRUMENTATION AND ALLOGRAFT;  Surgeon: Estill Bamberg, MD;  Location: [facility] OR;  Service: Orthopedics;  Laterality: N/A;  ANTERIOR CERVICAL DECOMPRESSION FUSION, CERVICAL 3-4, CERVICAL 4-5 WITH INSTRUMENTATION AND ALLOGRAFT   BACK SURGERY     x3   HARDWARE REMOVAL Right 05/04/2023   Procedure: HARDWARE REMOVAL;  Surgeon: Juanell Fairly, MD;  Location: [facility] ORS;  Service: Orthopedics;  Laterality: Right;   NECK SURGERY  12/01/2007   ORIF ANKLE FRACTURE Right  12/02/2022   Procedure: OPEN REDUCTION INTERNAL FIXATION (ORIF) ANKLE FRACTURE;  Surgeon: Juanell Fairly, MD;  Location: [facility] ORS;  Service: Orthopedics;  Laterality: Right;   ORIF ANKLE FRACTURE Right 12/01/2022   Procedure: OPEN REDUCTION INTERNAL FIXATION (ORIF) ANKLE FRACTURE;  Surgeon: Juanell Fairly, MD;  Location: [facility] ORS;  Service: Orthopedics;  Laterality: Right;   PORTA CATH INSERTION N/A 04/03/2021   Procedure: PORTA CATH INSERTION;  Surgeon: Annice Needy, MD;  Location: [facility] INVASIVE CV LAB;  Service: Cardiovascular;  Laterality: N/A;   VIDEO BRONCHOSCOPY WITH ENDOBRONCHIAL ULTRASOUND Bilateral 10/14/2022   Procedure: VIDEO BRONCHOSCOPY WITH ENDOBRONCHIAL ULTRASOUND;  Surgeon: Salena Saner, MD;  Location: [facility] ORS;  Service: Pulmonary;  Laterality: Bilateral;    Social History   Socioeconomic History   Marital status: Divorced    Spouse name: Not on file   Number of children: Not on file   Years of education: Not on file   Highest education level: Not on file  Occupational History   Not on file  Tobacco Use   Smoking status: Former    Types: Cigars    Quit date: 11/30/2022    Years since quitting: 0.7   Smokeless tobacco: Never  Vaping Use   Vaping status: Former   Start date: 04/30/2017   Quit date: 11/30/2017  Substance and Sexual Activity   Alcohol use: Yes    Comment: beers 6 a day   Drug use: No   Sexual activity: Not Currently  Other Topics Concern   Not on file  Social History Narrative   Not on file   Social Determinants of Health   Financial Resource Strain: Medium Risk (04/05/2023)   Overall Financial Resource Strain (CARDIA)    Difficulty of Paying Living Expenses: Somewhat hard  Food Insecurity: Food Insecurity Present (05/07/2023)   Received from [facility], [facility]   Hunger Vital Sign    Worried About Running Out of Food in the Last Year: Sometimes true    Ran Out of Food in the Last Year: Sometimes true  Transportation Needs: No  Transportation Needs (04/05/2023)   PRAPARE - Administrator, Civil Service (Medical): No    Lack of Transportation (Non-Medical): No  Physical Activity: Inactive (01/01/2023)   Exercise Vital Sign    Days of Exercise per Week: 0 days    Minutes of Exercise per Session: 0 min  Stress: No Stress Concern Present (04/05/2023)   [name] of Occupational Health - Occupational Stress Questionnaire    Feeling of Stress : Only a little  Social Connections: Socially Isolated (04/05/2023)   Social Connection and Isolation Panel [NHANES]    Frequency of Communication with Friends and Family: More than  three times a week    Frequency of Social Gatherings with Friends and Family: Twice a week    Attends Religious Services: Never    Database administrator or Organizations: No    Attends Banker Meetings: Never    Marital Status: Divorced  Catering manager Violence: Not At Risk (04/05/2023)   Humiliation, Afraid, Rape, and Kick questionnaire    Fear of Current or Ex-Partner: No    Emotionally Abused: No    Physically Abused: No    Sexually Abused: No    Family History  Problem Relation Age of Onset   Diabetes Mother    Heart attack Father    Emphysema Father    Diabetes Other      Current Outpatient Medications:    albuterol (VENTOLIN HFA) 108 (90 Base) MCG/ACT inhaler, Inhale 2 puffs into the lungs every 6 (six) hours as needed for wheezing or shortness of breath., Disp: 18 g, Rfl: 6   amLODipine (NORVASC) 2.5 MG tablet, Take 1 tablet (2.5 mg total) by mouth daily., Disp: 90 tablet, Rfl: 0   atorvastatin (LIPITOR) 80 MG tablet, Take 1 tablet (80 mg total) by mouth daily., Disp: 90 tablet, Rfl: 0   Blood Glucose Monitoring Suppl (ONE TOUCH ULTRA 2) w/Device KIT, 1 each by Does not apply route daily., Disp: 1 kit, Rfl: 0   buPROPion (WELLBUTRIN SR) 150 MG 12 hr tablet, Take 150 mg by mouth 2 (two) times daily., Disp: , Rfl:    cetirizine (ZYRTEC) 10 MG tablet, Take 1  tablet (10 mg total) by mouth daily., Disp: 30 tablet, Rfl: 11   clonazePAM (KLONOPIN) 0.5 MG tablet, Take 0.5 mg by mouth every evening., Disp: , Rfl:    DULoxetine (CYMBALTA) 60 MG capsule, Take 60 mg by mouth every evening., Disp: , Rfl:    famotidine (PEPCID) 20 MG tablet, Take 1 tablet (20 mg total) by mouth 2 (two) times daily., Disp: 180 tablet, Rfl: 1   finasteride (PROSCAR) 5 MG tablet, TAKE 1 TABLET BY MOUTH ONCE DAILY, Disp: 90 tablet, Rfl: 0   fluticasone (FLONASE) 50 MCG/ACT nasal spray, Place 2 sprays into both nostrils daily., Disp: 16 g, Rfl: 6   Fluticasone-Umeclidin-Vilant (TRELEGY ELLIPTA) 200-62.5-25 MCG/ACT AEPB, Inhale 1 puff into the lungs daily., Disp: 14 each, Rfl: 0   glucose blood (ONETOUCH ULTRA) test strip, 1 each by Other route daily. Use as instructed, Disp: 100 each, Rfl: 3   Lancets (ONETOUCH ULTRASOFT) lancets, 1 each by Other route daily. Use as instructed, Disp: 100 each, Rfl: 3   metoprolol succinate (TOPROL-XL) 100 MG 24 hr tablet, TAKE 1 TABLET BY MOUTH ONCE DAILY WITH OR IMMEDIATELY FOLLOWING A MEAL, Disp: 90 tablet, Rfl: 0   mirtazapine (REMERON) 15 MG tablet, Take 15 mg by mouth at bedtime., Disp: , Rfl:    montelukast (SINGULAIR) 10 MG tablet, Take 10 mg by mouth daily., Disp: , Rfl:    naltrexone (DEPADE) 50 MG tablet, Take 50 mg by mouth daily., Disp: , Rfl:    OLANZapine (ZYPREXA) 5 MG tablet, Take 5 mg by mouth at bedtime., Disp: , Rfl:    sildenafil (VIAGRA) 100 MG tablet, Take 1 tablet (100 mg total) by mouth daily as needed for erectile dysfunction. Take two hours prior to intercourse on an empty stomach, Disp: 30 tablet, Rfl: 3   tenofovir (VIREAD) 300 MG tablet, TAKE 1 TABLET BY MOUTH ONCE DAILY, Disp: 90 tablet, Rfl: 3   testosterone cypionate (DEPOTESTOSTERONE CYPIONATE) 200 MG/ML  injection, INJECT INTRAMUSCULARLY EVERY 14 DAYS, Disp: 4 mL, Rfl: 1 No current facility-administered medications for this visit.  Facility-Administered  Medications Ordered in Other Visits:    heparin lock flush 100 unit/mL, 500 Units, Intravenous, Once, Creig Hines, MD   heparin lock flush 100 unit/mL, 500 Units, Intravenous, Once, Creig Hines, MD   sodium chloride flush (NS) 0.9 % injection 10 mL, 10 mL, Intravenous, PRN, Creig Hines, MD  Physical exam:  Vitals:   08/16/23 1350  BP: 119/81  Pulse: (!) 110  Resp: 16  Temp: 97.7 F (36.5 C)  TempSrc: Tympanic  SpO2: 97%  Weight: 242 lb 9.6 oz (110 kg)  Height: 6\' 2"  (1.88 m)   Physical Exam Cardiovascular:     Rate and Rhythm: Normal rate and regular rhythm.     Heart sounds: Normal heart sounds.  Pulmonary:     Effort: Pulmonary effort is normal.     Breath sounds: Normal breath sounds.  Abdominal:     General: Bowel sounds are normal.     Palpations: Abdomen is soft.  Skin:    General: Skin is warm and dry.  Neurological:     Mental Status: He is alert and oriented to person, place, and time.         Latest Ref Rng & Units 08/16/2023    1:28 PM  CMP  Glucose 70 - 99 mg/dL 347   BUN 8 - 23 mg/dL 9   Creatinine 4.25 - 9.56 mg/dL 3.87   Sodium 564 - 332 mmol/L 130   Potassium 3.5 - 5.1 mmol/L 4.1   Chloride 98 - 111 mmol/L 97   CO2 22 - 32 mmol/L 21   Calcium 8.9 - 10.3 mg/dL 8.6   Total Protein 6.5 - 8.1 g/dL 6.8   Total Bilirubin 0.3 - 1.2 mg/dL 0.8   Alkaline Phos 38 - 126 U/L 175   AST 15 - 41 U/L 34   ALT 0 - 44 U/L 29       Latest Ref Rng & Units 08/16/2023    1:28 PM  CBC  WBC 4.0 - 10.5 K/uL 11.0   Hemoglobin 13.0 - 17.0 g/dL 95.1   Hematocrit 88.4 - 52.0 % 39.3   Platelets 150 - 400 K/uL 110     Assessment and plan- Patient is a 62 y.o. male with history of stage IV follicular Lymphoma here for routine follow-up  Patient's last CT chest abdomen and pelvis with contrast was in April 2024 which showed gradually enlarging spleen of 17.3 cm with a volume of 103 8 cc.  He also had mildly enlarging mediastinal adenopathy as well as waxing and  waning lung nodules.  Her plan was to get a PET CT scan in about 4 months time from then but patient had refused surveillance imaging this year.  Patient is willing to proceed with PET scan at this time which we will get it in the next 2 weeks.  I will see him 1 week after the PET scan and decide about further management based on lab   Visit Diagnosis 1. 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 Owens Shark, MD, MPH [facility] [phone] 08/16/2023 3:02 PM

## 2023-08-18 ENCOUNTER — Other Ambulatory Visit: Payer: Self-pay | Admitting: Urology

## 2023-08-20 ENCOUNTER — Other Ambulatory Visit: Payer: Self-pay

## 2023-08-20 MED ORDER — FAMOTIDINE 20 MG PO TABS: 20.0000 mg | ORAL_TABLET | Freq: Two times a day (BID) | ORAL | 1 refills | Status: DC

## 2023-08-25 ENCOUNTER — Other Ambulatory Visit: Payer: Self-pay

## 2023-08-25 MED ORDER — MONTELUKAST SODIUM 10 MG PO TABS: 10.0000 mg | ORAL_TABLET | Freq: Every day | ORAL | 0 refills | Status: DC

## 2023-08-25 MED ORDER — FAMOTIDINE 20 MG PO TABS: 20.0000 mg | ORAL_TABLET | Freq: Two times a day (BID) | ORAL | 1 refills | Status: DC

## 2023-08-30 ENCOUNTER — Ambulatory Visit
Admission: RE | Admit: 2023-08-30 | Discharge: 2023-08-30 | Disposition: A | Discharge status: Home / Self Care | Payer: Medicare HMO | Source: Ambulatory Visit | Attending: Oncology | Admitting: Oncology

## 2023-08-30 DIAGNOSIS — C8220 Follicular lymphoma grade III, unspecified, unspecified site: Secondary | ICD-10-CM | POA: Diagnosis present

## 2023-08-30 DIAGNOSIS — R911 Solitary pulmonary nodule: Secondary | ICD-10-CM | POA: Diagnosis not present

## 2023-08-30 DIAGNOSIS — C8202 Follicular lymphoma grade I, intrathoracic lymph nodes: Secondary | ICD-10-CM | POA: Insufficient documentation

## 2023-08-30 DIAGNOSIS — C8593 Non-Hodgkin lymphoma, unspecified, intra-abdominal lymph nodes: Secondary | ICD-10-CM | POA: Diagnosis not present

## 2023-08-30 LAB — GLUCOSE, CAPILLARY: Glucose-Capillary: 95 mg/dL (ref 70–99)

## 2023-08-30 MED ORDER — FLUDEOXYGLUCOSE F - 18 (FDG) INJECTION
12.0000 | Freq: Once | INTRAVENOUS | Status: AC
  Administered 2023-08-30: 12.79 via INTRAVENOUS

## 2023-09-03 ENCOUNTER — Other Ambulatory Visit: Payer: Self-pay | Admitting: Nurse Practitioner

## 2023-09-03 DIAGNOSIS — Z1212 Encounter for screening for malignant neoplasm of rectum: Secondary | ICD-10-CM

## 2023-09-03 DIAGNOSIS — Z1211 Encounter for screening for malignant neoplasm of colon: Secondary | ICD-10-CM

## 2023-09-07 ENCOUNTER — Inpatient Hospital Stay: Payer: Medicare HMO | Attending: Oncology | Admitting: Oncology

## 2023-09-07 ENCOUNTER — Encounter: Payer: Self-pay | Admitting: Oncology

## 2023-09-07 ENCOUNTER — Other Ambulatory Visit: Payer: Self-pay

## 2023-09-07 VITALS — BP 118/80 | HR 92 | Temp 98.2°F | Resp 18 | Ht 74.0 in | Wt 242.5 lb

## 2023-09-07 DIAGNOSIS — Z08 Encounter for follow-up examination after completed treatment for malignant neoplasm: Secondary | ICD-10-CM

## 2023-09-07 DIAGNOSIS — C8202 Follicular lymphoma grade I, intrathoracic lymph nodes: Secondary | ICD-10-CM

## 2023-09-07 DIAGNOSIS — Z8572 Personal history of non-Hodgkin lymphomas: Secondary | ICD-10-CM | POA: Diagnosis not present

## 2023-09-07 DIAGNOSIS — Z79624 Long term (current) use of inhibitors of nucleotide synthesis: Secondary | ICD-10-CM | POA: Insufficient documentation

## 2023-09-07 DIAGNOSIS — D696 Thrombocytopenia, unspecified: Secondary | ICD-10-CM | POA: Diagnosis not present

## 2023-09-07 DIAGNOSIS — Z79899 Other long term (current) drug therapy: Secondary | ICD-10-CM | POA: Diagnosis not present

## 2023-09-08 DIAGNOSIS — F410 Panic disorder [episodic paroxysmal anxiety] without agoraphobia: Secondary | ICD-10-CM | POA: Diagnosis not present

## 2023-09-08 DIAGNOSIS — F1010 Alcohol abuse, uncomplicated: Secondary | ICD-10-CM | POA: Diagnosis not present

## 2023-09-08 DIAGNOSIS — F3162 Bipolar disorder, current episode mixed, moderate: Secondary | ICD-10-CM | POA: Diagnosis not present

## 2023-09-10 ENCOUNTER — Ambulatory Visit: Payer: Self-pay | Admitting: *Deleted

## 2023-09-10 DIAGNOSIS — M5459 Other low back pain: Secondary | ICD-10-CM | POA: Diagnosis not present

## 2023-09-10 NOTE — Patient Outreach (Signed)
Care Coordination   09/10/2023 Name: Russell Simpson MRN: [id number] DOB: July 08, 1961   Care Coordination Outreach Attempts:  An unsuccessful telephone outreach was attempted for a scheduled appointment today.  Follow Up Plan:  Additional outreach attempts will be made to offer the patient care coordination information and services.   Encounter Outcome:  No Answer   Care Coordination Interventions:  No, not indicated    Kemper Durie RN, MSN, CCM [facility], [facility] RN Care Coordinator Direct Dial: [phone] / Main [phone] Fax [phone] Email: [email or web address]: [email or web address]

## 2023-09-12 NOTE — Progress Notes (Signed)
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 [name]  He sustained a left anterior frontal sinus as well as supraorbital rim fracture on 11/21/2019 which was managed conservatively.  He then presented to the ER at [facility] with symptoms of right-sided chest wall pain this led to a CT PE which did not show any evidence of pulmonary embolism.  He was noted to have bilateral symmetric axillary adenopathy measuring up to 1.8 cm.  Scattered mediastinal adenopathy.  Right paratracheal node measuring up to 1.2 cm.  Prominent right costophrenic lymph node measuring up to 1 cm.  Findings are nonspecific but could be seen with lymphoma versus systemic inflammatory disease such as sarcoidosis.  Of note patient has had a prior CT scan for lung screening back in 2015 when he was not noted to have this adenopathy.    Repeat CT chest in August 2021 showed persistent mild nonbulky axillary and mediastinal adenopathy which had not changed significantly as compared to his prior CT in December 2020.  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   Repeat CT in  March 2022 showed Progression and multistation adenopathy as well as significant splenomegaly measuring 20.4 cm as compared to 14.5 cm on CT scan September 2021.  CT scan showed multistation adenopathy with SUVs ranging between 6-8.9.  Patient underwent another core biopsy of right inguinal lymph node which was consistent with follicular lymphoma grade 1-2.  Bone marrow biopsy also showed moderate involvement with non-Hodgkin's B-cell lymphoma.  Baseline hepatitis B testing showed core antibody positivity.  Seen by GI and started on tenofovir   Patient had symptoms of nausea and sweating during cycle 1 of Rituxan which resolved with additional premedications.  He developed symptoms of itching over neck chest back and bilateral eyes with second dose of Rituxan early and had to get more premedications and was not rechallenged on the same day.  Patient subsequently received cycle 3 of Bendamustine Rituxan chemotherapy after Rituxan was given as a split dose over 3 days   Scans after 6 cycles of Bendamustine Rituxan chemotherapy in October 2022 showed significant improvement with therapy.  Lymphadenopathy in his neck and mediastinum resolved Deauville 2.  Decrease in the splenic size and hypermetabolism.  No new lesions.  FDG accumulation within the skeleton presumably due to stimulatory effects of treatment.   Scans in April 2023 showed pulmonary nodules requiring further assessment and patient was seen by pulmonary.  He also had a PET CT scan in October 2023 which showed gradual enlargement and hypermetabolic some noted in the lung nodules.  Patient also has intra-abdominal lymph nodes ranging from 9 to 1.2 cm with an SUV less than 3.  Patient had right lower lobe lung biopsy which was negative  for malignancy.  Interval history-patient has quit smoking but continues to struggle with alcohol intake.  Appetite and weight have remained stable.  ECOG PS- 1 Pain scale- 0   Review of systems- Review of Systems   Constitutional:  Negative for chills, fever, malaise/fatigue and weight loss.  HENT:  Negative for congestion, ear discharge and nosebleeds.   Eyes:  Negative for blurred vision.  Respiratory:  Negative for cough, hemoptysis, sputum production, shortness of breath and wheezing.   Cardiovascular:  Negative for chest pain, palpitations, orthopnea and claudication.  Gastrointestinal:  Negative for abdominal pain, blood in stool, constipation, diarrhea, heartburn, melena, nausea and vomiting.  Genitourinary:  Negative for dysuria, flank pain, frequency, hematuria and urgency.  Musculoskeletal:  Negative for back pain, joint pain and myalgias.  Skin:  Negative for rash.  Neurological:  Negative for dizziness, tingling, focal weakness, seizures, weakness and headaches.  Endo/Heme/Allergies:  Does not bruise/bleed easily.  Psychiatric/Behavioral:  Negative for depression and suicidal ideas. The patient does not have insomnia.       Allergies  Allergen Reactions   Penicillins Anaphylaxis    Tolerates cefdinir, cephalexin and amoxicillin/clavulonate so true PCN allergy unlikely   Tetanus Toxoids Swelling   Lisinopril Cough   Losartan      Other reaction(s): Muscle Pain     Codeine Nausea Only and Nausea And Vomiting   Doxycycline Rash   Ruxience [Rituximab-Pvvr] Rash    05/06/21 pt w/ worsening rash during Ruxience infusion.  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HCC)    Arthritis    Asthma    Basal cell carcinoma 01/28/2021   R upper arm, EDC 03/04/2021   Basal cell carcinoma 12/31/2022   Right temporal hair line. Nodular. 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   left upper back ED&C done   Hypertension    Hyponatremia    Inappropriate sinus tachycardia (HCC)    Non Hodgkin's lymphoma (HCC)    Pneumonia    RSV (respiratory syncytial virus infection)    SCC (squamous cell carcinoma) 08/28/2021   upper chest right of midline, EDC done 09/17/2021   SCC (squamous cell carcinoma) 09/30/2022   left chest  ED&C done   Sleep apnea    does not wear CPAP ; uses humidifier instead   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   right upper back, [facility] 06/18/2022   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   right upper back ED&C done   Squamous cell carcinoma of skin 02/19/2021   L inferior mandible, treated with EDC   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/28/2021   R ant shoulder - ED&C   Squamous cell carcinoma of skin  08/28/2021   L upper abdomen - ED&C     Past Surgical History:  Procedure Laterality Date   ANTERIOR CERVICAL DECOMP/DISCECTOMY FUSION N/A 06/17/2016   Procedure: ANTERIOR CERVICAL DECOMPRESSION FUSION, CERVICAL 3-4, CERVICAL 4-5 WITH INSTRUMENTATION AND ALLOGRAFT;  Surgeon: Estill Bamberg, MD;  Location: [facility] OR;  Service: Orthopedics;  Laterality: N/A;  ANTERIOR CERVICAL DECOMPRESSION FUSION, CERVICAL 3-4, CERVICAL 4-5 WITH INSTRUMENTATION AND ALLOGRAFT   BACK SURGERY     x3   HARDWARE REMOVAL Right 05/04/2023   Procedure: HARDWARE REMOVAL;  Surgeon: Juanell Fairly, MD;  Location: [facility] ORS;  Service: Orthopedics;  Laterality: Right;   NECK SURGERY  12/01/2007   ORIF ANKLE FRACTURE Right 12/02/2022   Procedure: OPEN REDUCTION INTERNAL FIXATION (ORIF) ANKLE  FRACTURE;  Surgeon: Juanell Fairly, MD;  Location: [facility] ORS;  Service: Orthopedics;  Laterality: Right;   ORIF ANKLE FRACTURE Right 12/01/2022   Procedure: OPEN REDUCTION INTERNAL FIXATION (ORIF) ANKLE FRACTURE;  Surgeon: Juanell Fairly, MD;  Location: [facility] ORS;  Service: Orthopedics;  Laterality: Right;   PORTA CATH INSERTION N/A 04/03/2021   Procedure: PORTA CATH INSERTION;  Surgeon: Annice Needy, MD;  Location: [facility] INVASIVE CV LAB;  Service: Cardiovascular;  Laterality: N/A;   VIDEO BRONCHOSCOPY WITH ENDOBRONCHIAL ULTRASOUND Bilateral 10/14/2022   Procedure: VIDEO BRONCHOSCOPY WITH ENDOBRONCHIAL ULTRASOUND;  Surgeon: Salena Saner, MD;  Location: [facility] ORS;  Service: Pulmonary;  Laterality: Bilateral;    Social History   Socioeconomic History   Marital status: Divorced    Spouse name: Not on file   Number of children: Not on file   Years of education: Not on file   Highest education level: Not on file  Occupational History   Not on file  Tobacco Use   Smoking status: Former    Types: Cigars    Quit date: 11/30/2022    Years since quitting: 0.7   Smokeless tobacco: Never  Vaping Use   Vaping status: Former   Start date: 04/30/2017   Quit date: 11/30/2017  Substance and Sexual Activity   Alcohol use: Yes    Comment: beers 6 a day   Drug use: No   Sexual activity: Not Currently  Other Topics Concern   Not on file  Social History Narrative   Not on file   Social Determinants of Health   Financial Resource Strain: Medium Risk (04/05/2023)   Overall Financial Resource Strain (CARDIA)    Difficulty of Paying Living Expenses: Somewhat hard  Food Insecurity: Food Insecurity Present (05/07/2023)   Received from [facility]   Hunger Vital Sign    Worried About Running Out of Food in the Last Year: Sometimes true    Ran Out of Food in the Last Year: Sometimes true  Transportation Needs: No Transportation Needs (04/05/2023)   PRAPARE - Doctor, general practice (Medical): No    Lack of Transportation (Non-Medical): No  Physical Activity: Inactive (01/01/2023)   Exercise Vital Sign    Days of Exercise per Week: 0 days    Minutes of Exercise per Session: 0 min  Stress: No Stress Concern Present (04/05/2023)   [name] of Occupational Health - Occupational Stress Questionnaire    Feeling of Stress : Only a little  Social Connections: Socially Isolated (04/05/2023)   Social Connection and Isolation Panel [NHANES]    Frequency of Communication with Friends and Family: More than three times a week    Frequency of Social Gatherings with Friends and Family: Twice  a week    Attends Religious Services: Never    Active Member of Clubs or Organizations: No    Attends Banker Meetings: Never    Marital Status: Divorced  Catering manager Violence: Not At Risk (04/05/2023)   Humiliation, Afraid, Rape, and Kick questionnaire    Fear of Current or Ex-Partner: No    Emotionally Abused: No    Physically Abused: No    Sexually Abused: No    Family History  Problem Relation Age of Onset   Diabetes Mother    Heart attack Father    Emphysema Father    Diabetes Other      Current Outpatient Medications:    albuterol (VENTOLIN HFA) 108 (90 Base) MCG/ACT inhaler, Inhale 2 puffs into the lungs every 6 (six) hours as needed for wheezing or shortness of breath., Disp: 18 g, Rfl: 6   amLODipine (NORVASC) 2.5 MG tablet, Take 1 tablet (2.5 mg total) by mouth daily., Disp: 90 tablet, Rfl: 0   atorvastatin (LIPITOR) 80 MG tablet, Take 1 tablet (80 mg total) by mouth daily., Disp: 90 tablet, Rfl: 0   Blood Glucose Monitoring Suppl (ONE TOUCH ULTRA 2) w/Device KIT, 1 each by Does not apply route daily., Disp: 1 kit, Rfl: 0   buPROPion (WELLBUTRIN SR) 150 MG 12 hr tablet, Take 150 mg by mouth 2 (two) times daily., Disp: , Rfl:    cetirizine (ZYRTEC) 10 MG tablet, Take 1 tablet (10 mg total) by mouth daily., Disp: 30 tablet, Rfl: 11    clonazePAM (KLONOPIN) 0.5 MG tablet, Take 0.5 mg by mouth every evening., Disp: , Rfl:    DULoxetine (CYMBALTA) 60 MG capsule, Take 60 mg by mouth every evening., Disp: , Rfl:    famotidine (PEPCID) 20 MG tablet, Take 1 tablet (20 mg total) by mouth 2 (two) times daily., Disp: 180 tablet, Rfl: 1   finasteride (PROSCAR) 5 MG tablet, TAKE 1 TABLET BY MOUTH ONCE DAILY, Disp: 90 tablet, Rfl: 3   fluticasone (FLONASE) 50 MCG/ACT nasal spray, Place 2 sprays into both nostrils daily., Disp: 16 g, Rfl: 6   Fluticasone-Umeclidin-Vilant (TRELEGY ELLIPTA) 200-62.5-25 MCG/ACT AEPB, Inhale 1 puff into the lungs daily., Disp: 14 each, Rfl: 0   glucose blood (ONETOUCH ULTRA) test strip, 1 each by Other route daily. Use as instructed, Disp: 100 each, Rfl: 3   Lancets (ONETOUCH ULTRASOFT) lancets, 1 each by Other route daily. Use as instructed, Disp: 100 each, Rfl: 3   metoprolol succinate (TOPROL-XL) 100 MG 24 hr tablet, TAKE 1 TABLET BY MOUTH ONCE DAILY WITH OR IMMEDIATELY FOLLOWING A MEAL, Disp: 90 tablet, Rfl: 0   mirtazapine (REMERON) 15 MG tablet, Take 15 mg by mouth at bedtime., Disp: , Rfl:    montelukast (SINGULAIR) 10 MG tablet, Take 1 tablet (10 mg total) by mouth daily., Disp: 90 tablet, Rfl: 0   naltrexone (DEPADE) 50 MG tablet, Take 50 mg by mouth daily., Disp: , Rfl:    OLANZapine (ZYPREXA) 5 MG tablet, Take 5 mg by mouth at bedtime., Disp: , Rfl:    sildenafil (VIAGRA) 100 MG tablet, Take 1 tablet (100 mg total) by mouth daily as needed for erectile dysfunction. Take two hours prior to intercourse on an empty stomach, Disp: 30 tablet, Rfl: 3   tenofovir (VIREAD) 300 MG tablet, TAKE 1 TABLET BY MOUTH ONCE DAILY, Disp: 90 tablet, Rfl: 3   testosterone cypionate (DEPOTESTOSTERONE CYPIONATE) 200 MG/ML injection, INJECT INTRAMUSCULARLY EVERY 14 DAYS, Disp: 4 mL, Rfl: 1  No current facility-administered medications for this visit.  Facility-Administered Medications Ordered in Other Visits:    heparin  lock flush 100 unit/mL, 500 Units, Intravenous, Once, Creig Hines, MD   heparin lock flush 100 unit/mL, 500 Units, Intravenous, Once, Creig Hines, MD   sodium chloride flush (NS) 0.9 % injection 10 mL, 10 mL, Intravenous, PRN, Creig Hines, MD  Physical exam:  Vitals:   09/07/23 1320  BP: 118/80  Pulse: 92  Resp: 18  Temp: 98.2 F (36.8 C)  TempSrc: Tympanic  SpO2: 95%  Weight: 242 lb 8 oz (110 kg)  Height: 6\' 2"  (1.88 m)   Physical Exam Cardiovascular:     Rate and Rhythm: Normal rate and regular rhythm.     Heart sounds: Normal heart sounds.  Pulmonary:     Effort: Pulmonary effort is normal.     Breath sounds: Normal breath sounds.  Abdominal:     General: Bowel sounds are normal.     Palpations: Abdomen is soft.  Lymphadenopathy:     Comments: No palpable cervical, supraclavicular, axillary or inguinal adenopathy    Skin:    General: Skin is warm and dry.  Neurological:     Mental Status: He is alert and oriented to person, place, and time.         Latest Ref Rng & Units 08/16/2023    1:28 PM  CMP  Glucose 70 - 99 mg/dL 295   BUN 8 - 23 mg/dL 9   Creatinine 2.84 - 1.32 mg/dL 4.40   Sodium 102 - 725 mmol/L 130   Potassium 3.5 - 5.1 mmol/L 4.1   Chloride 98 - 111 mmol/L 97   CO2 22 - 32 mmol/L 21   Calcium 8.9 - 10.3 mg/dL 8.6   Total Protein 6.5 - 8.1 g/dL 6.8   Total Bilirubin 0.3 - 1.2 mg/dL 0.8   Alkaline Phos 38 - 126 U/L 175   AST 15 - 41 U/L 34   ALT 0 - 44 U/L 29       Latest Ref Rng & Units 08/16/2023    1:28 PM  CBC  WBC 4.0 - 10.5 K/uL 11.0   Hemoglobin 13.0 - 17.0 g/dL 36.6   Hematocrit 44.0 - 52.0 % 39.3   Platelets 150 - 400 K/uL 110     No images are attached to the encounter.  NM PET Image Restag (PS) Skull Base To Thigh  Addendum Date: 09/06/2023   ADDENDUM REPORT: 09/06/2023 13:18 ADDENDUM: Following discussion between referring clinician and interventional radiology, sampling of a right submandibular node is being  considered. An 8 mm short axis right submandibular node (series 6/image 33) demonstrates max SUV 6.1. Electronically Signed   By: Charline Bills M.D.   On: 09/06/2023 13:18   Result Date: 09/06/2023 CLINICAL DATA:  Subsequent treatment strategy for grade 1 follicular lymphoma. EXAM: NUCLEAR MEDICINE PET SKULL BASE TO THIGH TECHNIQUE: 12.8 mCi F-18 FDG was injected intravenously. Full-ring PET imaging was performed from the skull base to thigh after the radiotracer. CT data was obtained and used for attenuation correction and anatomic localization. Fasting blood glucose: 95 mg/dl COMPARISON:  CT chest abdomen pelvis dated 03/30/2023. PET-CT dated 12/23/2022. FINDINGS: Mediastinal blood pool activity: SUV max 2.7 Liver activity: SUV max 3.4 NECK: Small bilateral cervical nodes new/progressive from prior PET. 10 mm short axis left level 2 node (series 6/image 30), max SUV 4.6. Incidental CT findings: None. CHEST: Extensive thoracic lymphadenopathy, progressive. Index lesions include: --9 mm  short axis right supraclavicular node at the thoracic inlet (series 6/image 50), max SUV 5.7. --16 mm short axis high right paratracheal node (series 6/image 58), max SUV 7.1 --16 mm short axis subcarinal node (series 6/image 71), max SUV 6.8 --14 mm short axis right axillary node (series 6/image 60), max SUV 4.1 Stable subpleural nodularity in the bilateral lower lobes, measuring up to 16 mm at the left lung base (series 6/image 92), non FDG avid. Right chest port is looped in the neck and terminates in the right innominate vein, grossly unchanged. Incidental CT findings: Atherosclerotic calcifications of the aortic arch. Moderate coronary atherosclerosis of the LAD and right coronary artery. ABDOMEN/PELVIS: Splenomegaly, measuring 19.3 cm in maximal craniocaudal dimension (sagittal image 31) previously 17.5 cm. Associated diffuse hypermetabolism, max SUV 5.6, suggesting splenic involvement. Extensive abdominopelvic  lymphadenopathy, progressive. Index lesions include: --18 mm short axis portacaval node (series 6/image 107), max SUV 7.2 --2.9 cm short axis left para-aortic node (series 6/image 127), max SUV 6.7 --2.0 cm short axis right common iliac node (series 6/image 145), max SUV 7.7 --2.2 cm short axis right external iliac node (series 6/image 166), max SUV 7.2 --15 mm short axis left inguinal node (series 6/image 169), max SUV 6.3 No abnormal hypermetabolism in the liver, pancreas, or adrenal glands. Moderate hepatic steatosis. Incidental CT findings: Atherosclerotic calcifications of the abdominal aorta and branch vessels. Mild sigmoid diverticulosis, without evidence of diverticulitis. SKELETON: No focal hypermetabolic activity to suggest skeletal metastasis. Incidental CT findings: Degenerative changes of the visualized thoracolumbar spine. IMPRESSION: Progressive lymphadenopathy above and below the diaphragm, as above. Deauville criteria 5. Progressive splenomegaly with hypermetabolism. Stable subpleural nodularity in the bilateral lower lobes, non FDG avid. Right chest port is looped in the neck and terminates in the right innominate vein, grossly unchanged. Electronically Signed: By: Charline Bills M.D. On: 09/06/2023 09:43     Assessment and plan- Patient is a 62 y.o. male routine follow-up of follicular lymphoma  Patient received 6 cycles of Bendamustine Rituxan chemotherapy ending in September 2022.  Since then we have been monitoring his scans and he has not required any treatment so far.  No B symptoms.  CBC has been unremarkable except for mild thrombocytopenia which can be seen in the setting of alcohol use as well.  Patient recently underwent a PET scan on 08/30/2023.  I have reviewed PET CT scan images independently and discussed the findings with the patient which shows mild hypermetabolic but extensive adenopathy that was not bulky in his bilateral cervical nodes, thoracic adenopathy and  abdominopelvic adenopathy as well as inguinal adenopathy.  Spleen is gradually increasing in size from 17.5 cm presently to 19.3 cm.  Patient currently does not have GELF criteria to meet treatment just yet.  However if his splenomegaly continues to increase in size with worsening adenopathy in the future we will need to start second line treatment.  We may be at that point in the next 3 to 6 months sometime.  Overall the SUV uptake in all these lymph nodes is ranging between 5-7 and similar throughout.  It is unlikely that we are dealing with a histological transformation to her diffuse large B-cell lymphoma but we can consider getting inguinal lymph node biopsy  if and when we decide to start treatment.  I have discussed all this with patient and his mother in great detail.  It would be very important for patient to stop his alcohol intake in order to tolerate treatment well as well as for  better outcomes.  There would be role for by specific treatment or potentially CAR-T cell therapy as third line but in terms of second line treatment we have 2 options.  We can either do Rituxan Revlimid versus obinutuzumab plus Revlimid.  I would prefer Rituxan Revlimid regimen for him as this would be a shorter course of 1 year versus a prolonged course with obinutuzumab and Revlimid.  I will see him back in about 3 months with CT scans prior to discuss treatment options at that time   Visit Diagnosis 1. Encounter for follow-up surveillance of lymphoma      Dr. Owens Shark, MD, MPH [facility] [phone] 09/12/2023 7:30 PM

## 2023-09-13 ENCOUNTER — Ambulatory Visit: Payer: Medicare HMO | Admitting: Oncology

## 2023-09-15 ENCOUNTER — Ambulatory Visit: Payer: Medicare HMO | Admitting: Oncology

## 2023-09-23 ENCOUNTER — Other Ambulatory Visit: Payer: Medicare HMO

## 2023-09-23 ENCOUNTER — Other Ambulatory Visit: Payer: Self-pay

## 2023-09-23 DIAGNOSIS — E291 Testicular hypofunction: Secondary | ICD-10-CM | POA: Diagnosis not present

## 2023-09-23 DIAGNOSIS — N401 Enlarged prostate with lower urinary tract symptoms: Secondary | ICD-10-CM

## 2023-09-23 NOTE — Progress Notes (Deleted)
09/28/2023 3:57 PM   Russell Simpson [birth year] [id number]  Referring provider: [name], NP [address] [place],  [place] [address]  Urological history: 1. Hypogonadotrophic hypogonadism -Contributing factors of a prolactin secreting pituitary tumor, alcohol consumption, sleep apnea and age -Testosterone (08/2023) pending -Hematocrit (08/2023) pending -testosterone cypionate 200 mg/milliliters, 1 cc every 14 days   2.  Erectile dysfunction -Contributing factors of hypogonadism, BPH, smoking, sleep apnea, depression, hypertension and COPD and alcohol consumption -failed PDE5i's   3. BPH with LU TS -PSA (08/2023) pending -finasteride 5 mg daily  No chief complaint on file.  HPI: Russell Simpson is a 62 y.o. male who presents today for follow up.    Previous records reviewed.   SHIM ***      Score: 1-7 Severe ED 8-11 Moderate ED 12-16 Mild-Moderate ED 17-21 Mild ED 22-25 No ED   I PSS ***       Score:  1-7 Mild 8-19 Moderate 20-35 Severe    PMH: 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HCC)    Arthritis    Asthma    Basal cell carcinoma 01/28/2021   R upper arm, EDC 03/04/2021   Basal cell carcinoma 12/31/2022   Right temporal hair line. Nodular. 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   left upper back ED&C  done   Hypertension    Hyponatremia    Inappropriate sinus tachycardia (HCC)    Non Hodgkin's lymphoma (HCC)    Pneumonia    RSV (respiratory syncytial virus infection)    SCC (squamous cell carcinoma) 08/28/2021   upper chest right of midline, EDC done 09/17/2021   SCC (squamous cell carcinoma) 09/30/2022   left chest  ED&C done   Sleep apnea    does not wear CPAP ; uses humidifier instead   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   right upper back, ED&C 06/18/2022   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   right upper back ED&C done   Squamous cell carcinoma of skin 02/19/2021   L inferior mandible, treated with EDC   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/28/2021   R ant shoulder - ED&C   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/28/2021   L upper abdomen - ED&C    Surgical History: Past Surgical History:  Procedure Laterality Date   ANTERIOR CERVICAL DECOMP/DISCECTOMY FUSION N/A 06/17/2016   Procedure: ANTERIOR CERVICAL DECOMPRESSION FUSION, CERVICAL 3-4, CERVICAL 4-5 WITH INSTRUMENTATION AND ALLOGRAFT;  Surgeon: Estill Bamberg, MD;  Location: [facility];  Service: Orthopedics;  Laterality: N/A;  ANTERIOR CERVICAL DECOMPRESSION FUSION, CERVICAL 3-4, CERVICAL 4-5 WITH INSTRUMENTATION AND ALLOGRAFT   BACK SURGERY     x3   HARDWARE REMOVAL Right 05/04/2023   Procedure: HARDWARE REMOVAL;  Surgeon: Juanell Fairly, MD;  Location: [facility] ORS;  Service: Orthopedics;  Laterality: Right;   NECK SURGERY  12/01/2007   ORIF ANKLE FRACTURE Right 12/02/2022   Procedure: OPEN REDUCTION INTERNAL FIXATION (ORIF) ANKLE FRACTURE;  Surgeon: Juanell Fairly, MD;  Location: [facility] ORS;  Service: Orthopedics;  Laterality: Right;   ORIF ANKLE FRACTURE Right 12/01/2022   Procedure: OPEN REDUCTION INTERNAL FIXATION (ORIF) ANKLE FRACTURE;  Surgeon: Juanell Fairly, MD;  Location: [facility];  Service: Orthopedics;  Laterality: Right;   PORTA CATH INSERTION N/A 04/03/2021   Procedure: PORTA CATH  INSERTION;  Surgeon: Annice Needy, MD;  Location: [facility];  Service: Cardiovascular;  Laterality: N/A;   VIDEO BRONCHOSCOPY WITH ENDOBRONCHIAL ULTRASOUND Bilateral 10/14/2022   Procedure: VIDEO BRONCHOSCOPY WITH ENDOBRONCHIAL ULTRASOUND;  Surgeon: Salena Saner, MD;  Location: [facility] OR[facility];  Service: Pulmonary;  Laterality: Bilateral;    Home Medications:  Allergies as of 09/28/2023       Reactions   Penicillins Anaphylaxis   Tolerates cefdinir, cephalexin and amoxicillin/clavulonate so true PCN allergy unlikely   Tetanus Toxoids Swelling   Lisinopril Cough   Losartan    Other reaction(s): Muscle Pain   Codeine Nausea Only, Nausea And Vomiting   Doxycycline Rash   Ruxience [rituximab-pvvr] Rash   05/06/21 pt w/ worsening rash during Ruxience infusion.  Face, neck and chest flushing redness        Medication List        Accurate as of September 23, 2023  3:57 PM. If you have any questions, ask your nurse or doctor.          albuterol 108 (90 Base) MCG/ACT inhaler Commonly known as: VENTOLIN HFA Inhale 2 puffs into the lungs every 6 (six) hours as needed for wheezing or shortness of breath.   amLODipine 2.5 MG tablet Commonly known as: NORVASC Take 1 tablet (2.5 mg total) by mouth daily.   atorvastatin 80 MG tablet Commonly known as: LIPITOR Take 1 tablet (80 mg total) by mouth daily.   buPROPion 150 MG 12 hr tablet Commonly known as: WELLBUTRIN SR Take 150 mg by mouth 2 (two) times daily.   cetirizine 10 MG tablet Commonly known as: ZYRTEC Take 1 tablet (10 mg total) by mouth daily.   clonazePAM 0.5 MG tablet Commonly known as: KLONOPIN Take 0.5 mg by mouth every evening.   DULoxetine 60 MG capsule Commonly known as: CYMBALTA Take 60 mg by mouth every evening.   famotidine 20 MG tablet Commonly known as: PEPCID Take 1 tablet (20 mg total) by mouth 2 (two) times daily.   finasteride 5 MG tablet Commonly known as: PROSCAR TAKE 1 TABLET BY  MOUTH ONCE DAILY   fluticasone 50 MCG/ACT nasal spray Commonly known as: FLONASE Place 2 sprays into both nostrils daily.   metoprolol succinate 100 MG 24 hr tablet Commonly known as: TOPROL-XL TAKE 1 TABLET BY MOUTH ONCE DAILY WITH OR IMMEDIATELY FOLLOWING A MEAL   mirtazapine 15 MG tablet Commonly known as: REMERON Take 15 mg by mouth at bedtime.   montelukast 10 MG tablet Commonly known as: SINGULAIR Take 1 tablet (10 mg total) by mouth daily.   naltrexone 50 MG tablet Commonly known as: DEPADE Take 50 mg by mouth daily.   OLANZapine 5 MG tablet Commonly known as: ZYPREXA Take 5 mg by mouth at bedtime.   ONE TOUCH ULTRA 2 w/Device Kit 1 each by Does not apply route daily.   OneTouch Ultra test strip Generic drug: glucose blood  1 each by Other route daily. Use as instructed   onetouch ultrasoft lancets 1 each by Other route daily. Use as instructed   sildenafil 100 MG tablet Commonly known as: VIAGRA Take 1 tablet (100 mg total) by mouth daily as needed for erectile dysfunction. Take two hours prior to intercourse on an empty stomach   tenofovir 300 MG tablet Commonly known as: VIREAD TAKE 1 TABLET BY MOUTH ONCE DAILY   testosterone cypionate 200 MG/ML injection Commonly known as: DEPOTESTOSTERONE CYPIONATE INJECT INTRAMUSCULARLY EVERY 14 DAYS   Trelegy Ellipta 200-62.5-25 MCG/ACT Aepb Generic drug: Fluticasone-Umeclidin-Vilant Inhale 1 puff into the lungs daily.        Allergies:  Allergies  Allergen Reactions   Penicillins Anaphylaxis    Tolerates cefdinir, cephalexin and amoxicillin/clavulonate so true PCN allergy unlikely   Tetanus Toxoids Swelling   Lisinopril Cough   Losartan      Other reaction(s): Muscle Pain     Codeine Nausea Only and Nausea And Vomiting   Doxycycline Rash   Ruxience [Rituximab-Pvvr] Rash    05/06/21 pt w/ worsening rash during Ruxience infusion.  Face, neck and chest flushing redness    Family History: Family  History  Problem Relation Age of Onset   Diabetes Mother    Heart attack Father    Emphysema Father    Diabetes Other     Social History:  reports that he quit smoking about 9 months ago. His smoking use included cigars. He has never used smokeless tobacco. He reports current alcohol use. He reports that he does not use drugs.  ROS: Pertinent ROS in HPI  Physical Exam: There were no vitals taken for this visit.  Constitutional:  Well nourished. Alert and oriented, No acute distress. HEENT: [place] AT, moist mucus membranes.  Trachea midline, no masses. Cardiovascular: No clubbing, cyanosis, or edema. Respiratory: Normal respiratory effort, no increased work of breathing. GI: Abdomen is soft, non tender, non distended, no abdominal masses. Liver and spleen not palpable.  No hernias appreciated.  Stool sample for occult testing is not indicated.   GU: No CVA tenderness.  No bladder fullness or masses.  Patient with circumcised/uncircumcised phallus. ***Foreskin easily retracted***  Urethral meatus is patent.  No penile discharge. No penile lesions or rashes. Scrotum without lesions, cysts, rashes and/or edema.  Testicles are located scrotally bilaterally. No masses are appreciated in the testicles. Left and right epididymis are normal. Rectal: Patient with  normal sphincter tone. Anus and perineum without scarring or rashes. No rectal masses are appreciated. Prostate is approximately *** grams, *** nodules are appreciated. Seminal vesicles are normal. Skin: No rashes, bruises or suspicious lesions. Lymph: No cervical or inguinal adenopathy. Neurologic: Grossly intact, no focal deficits, moving all 4 extremities. Psychiatric: Normal mood and affect.   Laboratory Data: Lab Results  Component Value Date   WBC 11.0 (H) 08/16/2023   HGB 13.6 08/16/2023   HCT 39.3 08/16/2023   MCV 91.2 08/16/2023   PLT 110 (L) 08/16/2023    Lab Results  Component Value Date   CREATININE 1.19 08/16/2023      Lab Results  Component Value Date   TESTOSTERONE 366 06/24/2023    Lab Results  Component Value Date   HGBA1C 5.6 07/19/2023        Component Value Date/Time   CHOL 146 07/19/2023 1421   HDL 33 (L) 07/19/2023 1421   CHOLHDL 3.8 06/23/2022 1327   VLDL 19 06/23/2022 1327   LDLCALC 94 07/19/2023 1421   LDLCALC  108 (H) 10/15/2017 0906    Lab Results  Component Value Date   AST 34 08/16/2023   Lab Results  Component Value Date   ALT 29 08/16/2023  I have reviewed the labs.   Pertinent Imaging: N/A    Assessment & Plan:    1. Hypogonadism -testosterone level therapeutic -HCT normal -continue testosterone cypionate 200 mg/mL, 1 cc every 14 days -Return to clinic in 3 months for testosterone, hemoglobin hematocrit  2. BPH with LU TS -PSA stable -continue finasteride 5 mg daily  3. ED -Since he has not been responding to the sildenafil 20 mg on-demand dosing and he did not respond to ICI, I discussed referral to a high-volume implanter for penile prosthesis, he deferred -He would like to try sildenafil 100 mg on-demand dosing   No follow-ups on file.  These notes generated with voice recognition software. I apologize for typographical errors.  Cloretta Ned  [facility] [address]  [address] [place], [place] [address] [phone]

## 2023-09-24 LAB — TESTOSTERONE: Testosterone: 349 ng/dL (ref 264–916)

## 2023-09-24 LAB — PSA: Prostate Specific Ag, Serum: 2 ng/mL (ref 0.0–4.0)

## 2023-09-24 LAB — HEMOGLOBIN AND HEMATOCRIT, BLOOD
Hematocrit: 37.4 % — ABNORMAL LOW (ref 37.5–51.0)
Hemoglobin: 12.8 g/dL — ABNORMAL LOW (ref 13.0–17.7)

## 2023-09-28 ENCOUNTER — Ambulatory Visit: Payer: Medicare HMO | Admitting: Urology

## 2023-09-28 DIAGNOSIS — N401 Enlarged prostate with lower urinary tract symptoms: Secondary | ICD-10-CM

## 2023-09-28 DIAGNOSIS — E291 Testicular hypofunction: Secondary | ICD-10-CM

## 2023-09-28 DIAGNOSIS — N5201 Erectile dysfunction due to arterial insufficiency: Secondary | ICD-10-CM

## 2023-09-29 DIAGNOSIS — M5459 Other low back pain: Secondary | ICD-10-CM | POA: Diagnosis not present

## 2023-10-04 ENCOUNTER — Other Ambulatory Visit: Payer: Self-pay | Admitting: Nurse Practitioner

## 2023-10-04 DIAGNOSIS — M545 Low back pain, unspecified: Secondary | ICD-10-CM | POA: Insufficient documentation

## 2023-10-05 NOTE — Telephone Encounter (Signed)
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)   [place] [facility] [place], Corrie Dandy T, NP   3 months ago Paresthesia of foot, bilateral   [place] [place], Sherran Needs, NP   7 months ago Seasonal allergic rhinitis due to pollen   [facility] [name], NP   8 months ago Brief loss of consciousness   [facility] [facility] [name], NP   9 months ago Brief loss of consciousness   [place] [facility] [name], NP       Future Appointments             In 2 weeks [place], Elana [facility] [place]   In 3 months Elie Goody, MD [facility]   In 3 months [name], NP [place] [facility]

## 2023-10-11 ENCOUNTER — Ambulatory Visit: Payer: Self-pay | Admitting: *Deleted

## 2023-10-11 DIAGNOSIS — M545 Low back pain, unspecified: Secondary | ICD-10-CM | POA: Diagnosis not present

## 2023-10-11 NOTE — Telephone Encounter (Signed)
  Chief Complaint: Sore on Tongue Symptoms: Small sore on tongue since summer months. "Just won't go away."  States dentist noted, said should be checked "But didn't look like cancer." Frequency: 5-6 months Pertinent Negatives: Patient denies bleeding, any drainage Disposition: [] ED /[] Urgent Care (no appt availability in office) / [x] Appointment(In office/virtual)/ []  [facility]/ [] Home Care/ [] Refused Recommended Disposition /[]  Mobile Bus/ []  Follow-up with PCP Additional Notes: Appt secured for Thursday. Care advise provided, pt verbalizes understanding. Reason for Disposition  Mouth ulcer lasts > 2 weeks  Answer Assessment - Initial Assessment Questions 1. LOCATION: "Where is the ulcer located?"      Side of tongue 2. NUMBER: "How many ulcers are there?"      One 3. SIZE: "How large is the ulcer?"      Smaller than eraser tip 4. SEVERITY: "Are they painful?" If Yes, ask: "How bad is it?"  (Scale 1-10; or mild, moderate, severe)  - MILD - eating  and drinking normally   - MODERATE - decreased liquid intake   - SEVERE - drinking very little      SORE TO EAT 5. ONSET: "When did you first notice the ulcer?"      Summer 6. RECURRENT SYMPTOM: "Have you had a mouth ulcer before?" If Yes, ask: "When was the last time?" and "What happened that time?"      No 7. CAUSE: "What do you think is causing the mouth ulcer?"     sure 8. OTHER SYMPTOMS: "Do you have any other symptoms?" (e.g., fever)     No  Protocols used: Mouth Ulcers-A-AH

## 2023-10-13 NOTE — Progress Notes (Unsigned)
   There were no vitals taken for this visit.   Subjective:    Patient ID: Russell Simpson, male    DOB: Oct 13, 1961, 62 y.o.   MRN: [id number]  HPI: Russell Simpson is a 62 y.o. male  No chief complaint on file.   Relevant past medical, surgical, family and social history reviewed and updated as indicated. Interim medical history since our last visit reviewed. Allergies and medications reviewed and updated.  Review of Systems  Per HPI unless specifically indicated above     Objective:    There were no vitals taken for this visit.  Wt Readings from Last 3 Encounters:  09/07/23 242 lb 8 oz (110 kg)  08/16/23 242 lb 9.6 oz (110 kg)  07/19/23 247 lb 9.6 oz (112.3 kg)    Physical Exam  Results for orders placed or performed in visit on 09/23/23  Testosterone  Result Value Ref Range   Testosterone 349 264 - 916 ng/dL  PSA  Result Value Ref Range   Prostate Specific Ag, Serum 2.0 0.0 - 4.0 ng/mL  Hemoglobin and Hematocrit, Blood  Result Value Ref Range   Hemoglobin 12.8 (L) 13.0 - 17.7 g/dL   Hematocrit 62.1 (L) 30.8 - 51.0 %   *Note: Due to a large number of results and/or encounters for the requested time period, some results have not been displayed. A complete set of results can be found in Results Review.      Assessment & Plan:   Problem List Items Addressed This Visit   None    Follow up plan: No follow-ups on file.

## 2023-10-14 ENCOUNTER — Encounter: Payer: Self-pay | Admitting: Nurse Practitioner

## 2023-10-14 ENCOUNTER — Ambulatory Visit (INDEPENDENT_AMBULATORY_CARE_PROVIDER_SITE_OTHER): Payer: Medicare HMO | Admitting: Nurse Practitioner

## 2023-10-14 VITALS — BP 138/87 | HR 129 | Temp 97.6°F | Ht 74.0 in | Wt 243.0 lb

## 2023-10-14 DIAGNOSIS — K121 Other forms of stomatitis: Secondary | ICD-10-CM | POA: Diagnosis not present

## 2023-10-14 MED ORDER — VALACYCLOVIR HCL 1 G PO TABS: 1000.0000 mg | ORAL_TABLET | Freq: Two times a day (BID) | ORAL | 0 refills | Status: AC

## 2023-10-18 NOTE — Progress Notes (Unsigned)
10/20/2023 3:04 PM   Russell Simpson February 01, 1961 [id number]  Referring provider: [name], NP [address] [place],  [place] [address]  Urological history: 1. Hypogonadotrophic hypogonadism -Contributing factors of a prolactin secreting pituitary tumor, alcohol consumption, sleep apnea and age -Testosterone (08/2023) 349 -Hematocrit (08/2023) 12.8/37.4 -testosterone cypionate 200 mg/milliliters, 1 cc every 14 days   2.  Erectile dysfunction -Contributing factors of hypogonadism, BPH, smoking, sleep apnea, depression, hypertension and COPD and alcohol consumption -failed PDE5i's   3. BPH with LU TS -PSA (08/2023) 2.0 - uncorrected 4.0  -finasteride 5 mg daily  Chief Complaint  Patient presents with   Follow-up   HPI: Russell Simpson is a 62 y.o. male who presents today for follow up.    Previous records reviewed.   He is not having any issues with the testosterone cypionate injections.  His insurance will be changing next year so we will likely need to do a prior authorization at that time.  SHIM 5  He is still not having satisfactory erections.  He states he can orgasm with masturbation, but he does not have an erection.  He is also only ejaculating a little of fluid.  He states PDE5 inhibitors have been ineffective and the ICI's were ineffective as well.  He states he had a break-up with his girlfriend because of this and he has not been comfortable going out on dates.   SHIM     Row Name 10/20/23 1428         SHIM: Over the last 6 months:   How do you rate your confidence that you could get and keep an erection? Very Low     When you had erections with sexual stimulation, how often were your erections hard enough for penetration (entering your partner)? Almost Never or Never     During sexual intercourse, how often were you able to maintain your erection after you had penetrated (entered) your partner? Almost Never or Never     During sexual intercourse, how  difficult was it to maintain your erection to completion of intercourse? Extremely Difficult     When you attempted sexual intercourse, how often was it satisfactory for you? Almost Never or Never       SHIM Total Score   SHIM 5             Score: 1-7 Severe ED 8-11 Moderate ED 12-16 Mild-Moderate ED 17-21 Mild ED 22-25 No ED   I PSS 2/2  He has no urinary complaints at this time.  Patient denies any modifying or aggravating factors.  Patient denies any recent UTI's, gross hematuria, dysuria or suprapubic/flank pain.  Patient denies any fevers, chills, nausea or vomiting.     IPSS     Row Name 10/20/23 1400         International Prostate Symptom Score   How often have you had the sensation of not emptying your bladder? Not at All     How often have you had to urinate less than every two hours? Not at All     How often have you found you stopped and started again several times when you urinated? Not at All     How often have you found it difficult to postpone urination? Not at All     How often have you had a weak urinary stream? Not at All     How often have you had to strain to start urination? Not at All  How many times did you typically get up at night to urinate? 2 Times     Total IPSS Score 2       Quality of Life due to urinary symptoms   If you were to spend the rest of your life with your urinary condition just the way it is now how would you feel about that? Mostly Satisfied               Score:  1-7 Mild 8-19 Moderate 20-35 Severe    PMH: 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HCC)    Arthritis    Asthma    Basal cell carcinoma 01/28/2021   R upper arm, EDC 03/04/2021   Basal cell carcinoma 12/31/2022   Right temporal hair line. Nodular. 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   left upper back ED&C done   Hypertension    Hyponatremia    Inappropriate sinus tachycardia (HCC)    Non Hodgkin's lymphoma (HCC)    Pneumonia    RSV (respiratory syncytial virus infection)    SCC (squamous cell carcinoma) 08/28/2021   upper chest right of midline, EDC done 09/17/2021   SCC (squamous cell carcinoma) 09/30/2022   left chest  ED&C done   Sleep apnea    does not wear CPAP ; uses humidifier instead   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   right upper back, ED&C 06/18/2022   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   right upper back ED&C done   Squamous cell carcinoma of skin 02/19/2021   L inferior mandible, treated with EDC   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/28/2021   R ant shoulder - ED&C   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/28/2021   L upper abdomen - ED&C    Surgical History: Past Surgical History:  Procedure Laterality Date   ANTERIOR CERVICAL DECOMP/DISCECTOMY FUSION N/A 06/17/2016   Procedure: ANTERIOR CERVICAL DECOMPRESSION FUSION, CERVICAL 3-4, CERVICAL 4-5 WITH INSTRUMENTATION AND ALLOGRAFT;  Surgeon: Estill Bamberg, MD;  Location: [facility];  Service: Orthopedics;  Laterality: N/A;  ANTERIOR CERVICAL DECOMPRESSION FUSION, CERVICAL 3-4, CERVICAL 4-5 WITH INSTRUMENTATION AND ALLOGRAFT   BACK SURGERY     x3   HARDWARE REMOVAL Right 05/04/2023   Procedure: HARDWARE REMOVAL;  Surgeon: Juanell Fairly, MD;  Location: [facility];  Service: Orthopedics;  Laterality:  Right;   NECK SURGERY  12/01/2007   ORIF ANKLE FRACTURE Right 12/02/2022   Procedure: OPEN REDUCTION INTERNAL FIXATION (ORIF) ANKLE FRACTURE;  Surgeon: Juanell Fairly, MD;  Location: [facility] OR[facility];  Service: Orthopedics;  Laterality: Right;   ORIF ANKLE FRACTURE Right 12/01/2022   Procedure: OPEN REDUCTION INTERNAL FIXATION (ORIF) ANKLE FRACTURE;  Surgeon: Juanell Fairly, MD;  Location: [facility];  Service:  Orthopedics;  Laterality: Right;   PORTA CATH INSERTION N/A 04/03/2021   Procedure: PORTA CATH INSERTION;  Surgeon: Annice Needy, MD;  Location: [facility];  Service: Cardiovascular;  Laterality: N/A;   VIDEO BRONCHOSCOPY WITH ENDOBRONCHIAL ULTRASOUND Bilateral 10/14/2022   Procedure: VIDEO BRONCHOSCOPY WITH ENDOBRONCHIAL ULTRASOUND;  Surgeon: Salena Saner, MD;  Location: [facility];  Service: Pulmonary;  Laterality: Bilateral;    Home Medications:  Allergies as of 10/20/2023       Reactions   Penicillins Anaphylaxis   Tolerates cefdinir, cephalexin and amoxicillin/clavulonate so true PCN allergy unlikely   Tetanus Toxoids Swelling   Lisinopril Cough   Losartan    Other reaction(s): Muscle Pain   Codeine Nausea Only, Nausea And Vomiting   Doxycycline Rash   Ruxience [rituximab-pvvr] Rash   05/06/21 pt w/ worsening rash during Ruxience infusion.  Face, neck and chest flushing redness        Medication List        Accurate as of October 20, 2023  3:04 PM. If you have any questions, ask your nurse or doctor.          albuterol 108 (90 Base) MCG/ACT inhaler Commonly known as: VENTOLIN HFA Inhale 2 puffs into the lungs every 6 (six) hours as needed for wheezing or shortness of breath.   amLODipine 2.5 MG tablet Commonly known as: NORVASC TAKE 1 TABLET BY MOUTH ONCE DAILY   atorvastatin 80 MG tablet Commonly known as: LIPITOR Take 1 tablet (80 mg total) by mouth daily.   buPROPion 150 MG 12 hr tablet Commonly known as: WELLBUTRIN SR Take 150 mg by mouth 2  (two) times daily.   cetirizine 10 MG tablet Commonly known as: ZYRTEC Take 1 tablet (10 mg total) by mouth daily.   clonazePAM 0.5 MG tablet Commonly known as: KLONOPIN Take 0.5 mg by mouth every evening.   DULoxetine 60 MG capsule Commonly known as: CYMBALTA Take 60 mg by mouth every evening.   famotidine 20 MG tablet Commonly known as: PEPCID Take 1 tablet (20 mg total) by mouth 2 (two) times daily.   finasteride 5 MG tablet Commonly known as: PROSCAR TAKE 1 TABLET BY MOUTH ONCE DAILY   fluticasone 50 MCG/ACT nasal spray Commonly known as: FLONASE Place 2 sprays into both nostrils daily.   metoprolol succinate 100 MG 24 hr tablet Commonly known as: TOPROL-XL TAKE 1 TABLET BY MOUTH ONCE DAILY WITH OR IMMEDIATELY FOLLOWING A MEAL   mirtazapine 15 MG tablet Commonly known as: REMERON Take 15 mg by mouth at bedtime.   montelukast 10 MG tablet Commonly known as: SINGULAIR Take 1 tablet (10 mg total) by mouth daily.   naltrexone 50 MG tablet Commonly known as: DEPADE Take 50 mg by mouth daily.   OLANZapine 5 MG tablet Commonly known as: ZYPREXA Take 5 mg by mouth at bedtime.   ONE TOUCH ULTRA 2 w/Device Kit 1 each by Does not apply route daily.   OneTouch Ultra test strip Generic drug: glucose blood 1 each by Other route daily. Use as instructed   onetouch ultrasoft lancets 1 each by Other route daily. Use as instructed   sildenafil 100 MG tablet Commonly known as: VIAGRA Take 1 tablet (100 mg total) by mouth daily as needed for erectile dysfunction. Take two hours prior to intercourse on an empty stomach   tenofovir 300 MG tablet Commonly known as: VIREAD TAKE 1 TABLET BY MOUTH ONCE DAILY   testosterone cypionate 200 MG/ML injection Commonly known  as: DEPOTESTOSTERONE CYPIONATE Inject 1 mL (200 mg total) into the muscle every 14 (fourteen) days. INJECT INTRAMUSCULARLY EVERY 14 DAYS What changed: See the new instructions. Changed by: Michiel Cowboy   Trelegy Ellipta 200-62.5-25 MCG/ACT Aepb Generic drug: Fluticasone-Umeclidin-Vilant Inhale 1 puff into the lungs daily.   valACYclovir 1000 MG tablet Commonly known as: VALTREX Take 1 tablet (1,000 mg total) by mouth 2 (two) times daily for 10 days.        Allergies:  Allergies  Allergen Reactions   Penicillins Anaphylaxis    Tolerates cefdinir, cephalexin and amoxicillin/clavulonate so true PCN allergy unlikely   Tetanus Toxoids Swelling   Lisinopril Cough   Losartan      Other reaction(s): Muscle Pain     Codeine Nausea Only and Nausea And Vomiting   Doxycycline Rash   Ruxience [Rituximab-Pvvr] Rash    05/06/21 pt w/ worsening rash during Ruxience infusion.  Face, neck and chest flushing redness    Family History: Family History  Problem Relation Age of Onset   Diabetes Mother    Heart attack Father    Emphysema Father    Diabetes Other     Social History:  reports that he quit smoking about 10 months ago. His smoking use included cigars. He has never used smokeless tobacco. He reports current alcohol use. He reports that he does not use drugs.  ROS: Pertinent ROS in HPI  Physical Exam: BP 131/89   Pulse 97   Ht 6\' 2"  (1.88 m)   Wt 244 lb (110.7 kg)   BMI 31.33 kg/m   Constitutional:  Well nourished. Alert and oriented, No acute distress. HEENT: [place] AT, moist mucus membranes.  Trachea midline Cardiovascular: No clubbing, cyanosis, or edema. Respiratory: Normal respiratory effort, no increased work of breathing. Neurologic: Grossly intact, no focal deficits, moving all 4 extremities. Psychiatric: Normal mood and affect.   Laboratory Data: Lab Results  Component Value Date   WBC 11.0 (H) 08/16/2023   HGB 12.8 (L) 09/23/2023   HCT 37.4 (L) 09/23/2023   MCV 91.2 08/16/2023   PLT 110 (L) 08/16/2023    Lab Results  Component Value Date   CREATININE 1.19 08/16/2023     Lab Results  Component Value Date   TESTOSTERONE 349 09/23/2023     Lab Results  Component Value Date   HGBA1C 5.6 07/19/2023        Component Value Date/Time   CHOL 146 07/19/2023 1421   HDL 33 (L) 07/19/2023 1421   CHOLHDL 3.8 06/23/2022 1327   VLDL 19 06/23/2022 1327   LDLCALC 94 07/19/2023 1421   LDLCALC 108 (H) 10/15/2017 0906    Lab Results  Component Value Date   AST 34 08/16/2023   Lab Results  Component Value Date   ALT 29 08/16/2023  I have reviewed the labs.   Pertinent Imaging: N/A    Assessment & Plan:    1. Hypogonadism -testosterone level therapeutic -HCT normal -continue testosterone cypionate 200 mg/mL, 1 cc every 14 days -Return to clinic in 3 months for testosterone, hemoglobin and hematocrit  2. BPH with LU TS -PSA stable -continue finasteride 5 mg daily  3. ED -failed PDE5i's  -failed ICI -discussed referral to [facility] for further evaluation for possible IPP since he has failed PDE 5 inhibitors and ICI  Return in about 3 months (around 01/20/2024) for Testosterone, hemoglobin and hematocrit only .  These notes generated with voice recognition software. I apologize for typographical errors.  Caidan Hubbert,  PA-C  [facility] [address]  [address] [place], [place] [address] [phone]

## 2023-10-20 ENCOUNTER — Ambulatory Visit: Payer: Medicare HMO | Admitting: Urology

## 2023-10-20 ENCOUNTER — Encounter: Payer: Self-pay | Admitting: Urology

## 2023-10-20 VITALS — BP 131/89 | HR 97 | Ht 74.0 in | Wt 244.0 lb

## 2023-10-20 DIAGNOSIS — N401 Enlarged prostate with lower urinary tract symptoms: Secondary | ICD-10-CM | POA: Diagnosis not present

## 2023-10-20 DIAGNOSIS — N5201 Erectile dysfunction due to arterial insufficiency: Secondary | ICD-10-CM | POA: Diagnosis not present

## 2023-10-20 DIAGNOSIS — E291 Testicular hypofunction: Secondary | ICD-10-CM

## 2023-10-20 MED ORDER — TESTOSTERONE CYPIONATE 200 MG/ML IM SOLN: 200.0000 mg | INTRAMUSCULAR | 0 refills | Status: DC

## 2023-10-22 ENCOUNTER — Telehealth: Payer: Self-pay | Admitting: *Deleted

## 2023-10-22 DIAGNOSIS — M5459 Other low back pain: Secondary | ICD-10-CM | POA: Diagnosis not present

## 2023-10-22 NOTE — Telephone Encounter (Signed)
Dr Noralyn Pick called back and said that patient does not have Lymphoma in his spine, but recent MRI done at [facility] does show retroperitoneal lymphadenopathy concerning for lymphoma until proven otherwise. Patient does have spinal stenosis and a lot of arthritis which is why he was sent to Pain clinic to do some injections and Dr Noralyn Pick said they will probably want clearance from Dr Smith Abid for injection. He is going to have the MRI sent to [place] via fax. His appointment is 12/19 for pain clinic. He said that he called patient and told him that lymphoma is not in his spine.

## 2023-10-22 NOTE — Telephone Encounter (Signed)
Patient called reporting that he just had appointment at [facility] and was told that per xray that "it looks like the lymphoma is in my spine affecting it"  He said that his appointment with Pain clinic is not until the 29th and he did not get to ask ortho for pain medicine so he is asking if we will order some for it. Please advise. 'I have  call in to [facility] to see if we can get a copy of that Xray report

## 2023-10-22 NOTE — Telephone Encounter (Signed)
We need xray report sent to [place] to decide if anything needs to be done at our end

## 2023-10-23 ENCOUNTER — Other Ambulatory Visit: Payer: Self-pay | Admitting: Nurse Practitioner

## 2023-10-25 ENCOUNTER — Encounter: Payer: Self-pay | Admitting: Oncology

## 2023-10-25 DIAGNOSIS — Z1212 Encounter for screening for malignant neoplasm of rectum: Secondary | ICD-10-CM | POA: Diagnosis not present

## 2023-10-25 DIAGNOSIS — Z1211 Encounter for screening for malignant neoplasm of colon: Secondary | ICD-10-CM | POA: Diagnosis not present

## 2023-10-25 NOTE — Telephone Encounter (Signed)
That MRI is scanned in under the Media tab date 09/29/23

## 2023-10-26 NOTE — Telephone Encounter (Signed)
Requested Prescriptions  Pending Prescriptions Disp Refills   metoprolol succinate (TOPROL-XL) 100 MG 24 hr tablet [Pharmacy Med Name: METOPROLOL SUCCINATE ER 100 MG TAB] 90 tablet 0    Sig: TAKE 1 TABLET BY MOUTH ONCE DAILY WITH OR IMMEDIATELY FOLLOWING A MEAL     Cardiovascular:  Beta Blockers Passed - 10/23/2023  3:19 PM      Passed - Last BP in normal range    BP Readings from Last 1 Encounters:  10/20/23 131/89         Passed - Last Heart Rate in normal range    Pulse Readings from Last 1 Encounters:  10/20/23 97         Passed - Valid encounter within last 6 months    Recent Outpatient Visits           1 week ago Mouth ulcer   [facility] [name], NP   3 months ago 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)   [facility] [place], Corrie Dandy T, NP   4 months ago Paresthesia of foot, bilateral   [facility] [place], Sherran Needs, NP   7 months ago Seasonal allergic rhinitis due to pollen   [facility] [name], NP   8 months ago Brief loss of consciousness   [facility] [name], NP       Future Appointments             In 2 months Elie Goody, MD [facility]   In 2 months [name], NP [facility]

## 2023-11-01 LAB — COLOGUARD: COLOGUARD: POSITIVE — AB

## 2023-11-02 ENCOUNTER — Encounter: Payer: Self-pay | Admitting: Pediatrics

## 2023-11-02 ENCOUNTER — Other Ambulatory Visit: Payer: Self-pay | Admitting: Nurse Practitioner

## 2023-11-02 ENCOUNTER — Ambulatory Visit (INDEPENDENT_AMBULATORY_CARE_PROVIDER_SITE_OTHER): Payer: Medicare HMO | Admitting: Pediatrics

## 2023-11-02 VITALS — BP 93/66 | HR 144 | Temp 98.1°F | Resp 16 | Wt 238.6 lb

## 2023-11-02 DIAGNOSIS — R000 Tachycardia, unspecified: Secondary | ICD-10-CM | POA: Diagnosis not present

## 2023-11-02 DIAGNOSIS — R195 Other fecal abnormalities: Secondary | ICD-10-CM

## 2023-11-02 DIAGNOSIS — R0981 Nasal congestion: Secondary | ICD-10-CM

## 2023-11-02 DIAGNOSIS — J189 Pneumonia, unspecified organism: Secondary | ICD-10-CM | POA: Diagnosis not present

## 2023-11-02 LAB — VERITOR FLU A/B WAIVED
Influenza A: NEGATIVE
Influenza B: NEGATIVE

## 2023-11-02 MED ORDER — ONDANSETRON HCL 4 MG PO TABS: 4.0000 mg | ORAL_TABLET | Freq: Three times a day (TID) | ORAL | 0 refills | Status: DC | PRN

## 2023-11-02 MED ORDER — LEVOFLOXACIN 500 MG PO TABS: 500.0000 mg | ORAL_TABLET | Freq: Every day | ORAL | 0 refills | Status: AC

## 2023-11-02 NOTE — Progress Notes (Signed)
Acute Visit  BP 93/66 (BP Location: Left Arm, Patient Position: Sitting, Cuff Size: Large)   Pulse (!) 144   Temp 98.1 F (36.7 C) (Oral)   Resp 16   Wt 238 lb 9.6 oz (108.2 kg)   SpO2 91%   BMI 30.63 kg/m    Subjective:    Patient ID: Shimshon Berney, male    DOB: 1961-08-31, 62 y.o.   MRN: [id number]  HPI: Percey Authier is a 62 y.o. male  Chief Complaint  Patient presents with   Nasal Congestion    Feels like tons of bricks in his head. Started Saturday. Can't keep anything down. Coughing fits and then a lot of phlegm. Sinus draining down throat    Dehydration    Feels he is since he can't keep anything   Hot Flashes   Chills   Congestion Started on Saturday, has been worsening. Unable to sleep last night Clear and yellow sputum production worsening Has been managing with Mucinex and mucinex DM He notes his HR is always elevated  Has had fevers and chills at home Feels very tired, unable to keep any food down Nausea and vomiting present as well No chest pain, palpations, orthopnea, dyspnea, PND, lower extremity edema.  Recently tested positive for cologuard  Relevant past medical, surgical, family and social history reviewed and updated as indicated. Interim medical history since our last visit reviewed. Allergies and medications reviewed and updated.  ROS per HPI unless specifically indicated above     Objective:    BP 93/66 (BP Location: Left Arm, Patient Position: Sitting, Cuff Size: Large)   Pulse (!) 144   Temp 98.1 F (36.7 C) (Oral)   Resp 16   Wt 238 lb 9.6 oz (108.2 kg)   SpO2 91%   BMI 30.63 kg/m   Wt Readings from Last 3 Encounters:  11/02/23 238 lb 9.6 oz (108.2 kg)  10/20/23 244 lb (110.7 kg)  10/14/23 243 lb (110.2 kg)     Physical Exam Constitutional:      General: He is not in acute distress.    Appearance: Normal appearance. He is not ill-appearing or diaphoretic.  HENT:     Head: Normocephalic and atraumatic.  Eyes:     Pupils:  Pupils are equal, round, and reactive to light.  Cardiovascular:     Rate and Rhythm: Regular rhythm. Tachycardia present.     Pulses: Normal pulses.     Heart sounds: Normal heart sounds.     Comments: Manual HR 98. Pulmonary:     Effort: Pulmonary effort is normal.     Breath sounds: Rales present.     Comments: Left lower lobe rales, difficult exam due to persistent coughing Abdominal:     General: Abdomen is flat.     Palpations: Abdomen is soft.  Musculoskeletal:        General: Normal range of motion.     Cervical back: Normal range of motion.  Skin:    General: Skin is warm and dry.     Capillary Refill: Capillary refill takes less than 2 seconds.  Neurological:     General: No focal deficit present.     Mental Status: He is alert. Mental status is at baseline.  Psychiatric:        Mood and Affect: Mood normal.        Behavior: Behavior normal.        Assessment & Plan:  Assessment & Plan   Community acquired pneumonia, unspecified laterality  Nasal congestion Tachycardia Strong suspicion for CAP given symptoms and vital sign abnormalities including tachycardia (~98 on manual repeat) and hypoxia. Rales on exam, not in acute distress no difficulty breathing. Plan to rule out viral etiologies as well. Plan to start levofloxacin given penicillin and doxy allergies. Given zofran for nausea. Encouraged aggressive fluid hydration, voiced understanding. Pt given strict return precautions and anticipatory guidance for ED. -     levoFLOXacin; Take 1 tablet (500 mg total) by mouth daily for 7 days.  Dispense: 7 tablet; Refill: 0 -     Veritor Flu A/B Waived -     Novel Coronavirus, NAA ([facility]) -     Ondansetron HCl; Take 1 tablet (4 mg total) by mouth every 8 (eight) hours as needed for nausea or vomiting.  Dispense: 20 tablet; Refill: 0 -     CBC with Differential/Platelet -     Comprehensive metabolic panel  Positive cologuard GI consult order already placed by  PCP.  Follow up plan: Return in about 1 week (around 11/09/2023), or if symptoms worsen or fail to improve.  Mariadel Mruk Howell Pringle, MD

## 2023-11-02 NOTE — Patient Instructions (Addendum)
Stay hydrated Start antibiotic for 7 days Zofran for nausea  Please seek medical attention in the Emergency Department immediately if you develop persistant or increased chest pain, shortness of breath, fainting spells, sudden sweatiness, pain radiating to your back, coughing up blood, leg swelling, fevers greater than 100.74F that do not resolve with Tylenol, or any other symptoms that concern you.

## 2023-11-03 ENCOUNTER — Encounter: Payer: Self-pay | Admitting: Orthopedic Surgery

## 2023-11-03 ENCOUNTER — Telehealth: Payer: Self-pay | Admitting: Gastroenterology

## 2023-11-03 LAB — COMPREHENSIVE METABOLIC PANEL
ALT: 21 [IU]/L (ref 0–44)
AST: 31 [IU]/L (ref 0–40)
Albumin: 4.6 g/dL (ref 3.9–4.9)
Alkaline Phosphatase: 218 [IU]/L — ABNORMAL HIGH (ref 44–121)
BUN/Creatinine Ratio: 9 — ABNORMAL LOW (ref 10–24)
BUN: 10 mg/dL (ref 8–27)
Bilirubin Total: 1 mg/dL (ref 0.0–1.2)
CO2: 21 mmol/L (ref 20–29)
Calcium: 9.3 mg/dL (ref 8.6–10.2)
Chloride: 92 mmol/L — ABNORMAL LOW (ref 96–106)
Creatinine, Ser: 1.16 mg/dL (ref 0.76–1.27)
Globulin, Total: 1.6 g/dL (ref 1.5–4.5)
Glucose: 128 mg/dL — ABNORMAL HIGH (ref 70–99)
Potassium: 4.4 mmol/L (ref 3.5–5.2)
Sodium: 131 mmol/L — ABNORMAL LOW (ref 134–144)
Total Protein: 6.2 g/dL (ref 6.0–8.5)
eGFR: 72 mL/min/{1.73_m2} (ref >59)

## 2023-11-03 LAB — CBC WITH DIFFERENTIAL/PLATELET
Basophils Absolute: 0 10*3/uL (ref 0.0–0.2)
Basos: 0 %
EOS (ABSOLUTE): 0 10*3/uL (ref 0.0–0.4)
Eos: 0 %
Hematocrit: 38.7 % (ref 37.5–51.0)
Hemoglobin: 13.1 g/dL (ref 13.0–17.7)
Immature Grans (Abs): 0.4 10*3/uL — ABNORMAL HIGH (ref 0.0–0.1)
Immature Granulocytes: 3 %
Lymphocytes Absolute: 6.7 10*3/uL — ABNORMAL HIGH (ref 0.7–3.1)
Lymphs: 53 %
MCH: 31.6 pg (ref 26.6–33.0)
MCHC: 33.9 g/dL (ref 31.5–35.7)
MCV: 93 fL (ref 79–97)
Monocytes Absolute: 1 10*3/uL — ABNORMAL HIGH (ref 0.1–0.9)
Monocytes: 8 %
Neutrophils Absolute: 4.5 10*3/uL (ref 1.4–7.0)
Neutrophils: 36 %
Platelets: 109 10*3/uL — ABNORMAL LOW (ref 150–450)
RBC: 4.15 x10E6/uL (ref 4.14–5.80)
RDW: 12.5 % (ref 11.6–15.4)
WBC: 12.7 10*3/uL — ABNORMAL HIGH (ref 3.4–10.8)

## 2023-11-03 NOTE — Telephone Encounter (Signed)
Good afternoon Dr. Lavon Paganini,   Patient called stating that his PCP has sent over a referral for patient to have a colonoscopy. After looking in patients chart, patient has previous gastro history with [facility] and was last seen in April of 2023. Patient records are in epic, will you please review and advise on scheduling patient?  Thank you.

## 2023-11-04 ENCOUNTER — Other Ambulatory Visit: Payer: Self-pay | Admitting: Pediatrics

## 2023-11-04 DIAGNOSIS — E871 Hypo-osmolality and hyponatremia: Secondary | ICD-10-CM

## 2023-11-04 DIAGNOSIS — D72829 Elevated white blood cell count, unspecified: Secondary | ICD-10-CM

## 2023-11-04 LAB — NOVEL CORONAVIRUS, NAA: SARS-CoV-2, NAA: NOT DETECTED

## 2023-11-05 ENCOUNTER — Other Ambulatory Visit: Payer: Self-pay | Admitting: Pulmonary Disease

## 2023-11-05 NOTE — Telephone Encounter (Signed)
Request received to transfer GI care from outside practice to [facility].  We appreciate the interest in our practice, however at this time due to high demand from patients without established GI providers we cannot accommodate this transfer.  Ability to accommodate future transfer requests may change over time and the patient can contact us again in 6-12 months if still interested in being seen at [facility].      °

## 2023-11-08 ENCOUNTER — Other Ambulatory Visit: Payer: Self-pay | Admitting: *Deleted

## 2023-11-08 DIAGNOSIS — C8202 Follicular lymphoma grade I, intrathoracic lymph nodes: Secondary | ICD-10-CM

## 2023-11-09 ENCOUNTER — Encounter: Payer: Self-pay | Admitting: Oncology

## 2023-11-09 ENCOUNTER — Inpatient Hospital Stay: Payer: Medicare HMO | Attending: Oncology | Admitting: Oncology

## 2023-11-09 ENCOUNTER — Inpatient Hospital Stay: Payer: Medicare HMO

## 2023-11-09 ENCOUNTER — Other Ambulatory Visit: Payer: Medicare HMO

## 2023-11-09 VITALS — BP 124/84 | HR 96 | Temp 97.6°F | Resp 19 | Wt 238.2 lb

## 2023-11-09 DIAGNOSIS — Z95828 Presence of other vascular implants and grafts: Secondary | ICD-10-CM

## 2023-11-09 DIAGNOSIS — Z87891 Personal history of nicotine dependence: Secondary | ICD-10-CM | POA: Diagnosis not present

## 2023-11-09 DIAGNOSIS — C8202 Follicular lymphoma grade I, intrathoracic lymph nodes: Secondary | ICD-10-CM | POA: Diagnosis not present

## 2023-11-09 LAB — CMP (CANCER CENTER ONLY)
ALT: 22 U/L (ref 0–44)
AST: 29 U/L (ref 15–41)
Albumin: 4 g/dL (ref 3.5–5.0)
Alkaline Phosphatase: 148 U/L — ABNORMAL HIGH (ref 38–126)
Anion gap: 11 (ref 5–15)
BUN: 11 mg/dL (ref 8–23)
CO2: 24 mmol/L (ref 22–32)
Calcium: 8.9 mg/dL (ref 8.9–10.3)
Chloride: 100 mmol/L (ref 98–111)
Creatinine: 1.09 mg/dL (ref 0.61–1.24)
GFR, Estimated: >60 mL/min (ref >60)
Glucose, Bld: 104 mg/dL — ABNORMAL HIGH (ref 70–99)
Potassium: 4.2 mmol/L (ref 3.5–5.1)
Sodium: 135 mmol/L (ref 135–145)
Total Bilirubin: 0.8 mg/dL (ref <1.2)
Total Protein: 6.5 g/dL (ref 6.5–8.1)

## 2023-11-09 LAB — CBC WITH DIFFERENTIAL/PLATELET
Abs Immature Granulocytes: 0.72 10*3/uL — ABNORMAL HIGH (ref 0.00–0.07)
Basophils Absolute: 0.1 10*3/uL (ref 0.0–0.1)
Basophils Relative: 1 %
Eosinophils Absolute: 0.2 10*3/uL (ref 0.0–0.5)
Eosinophils Relative: 1 %
HCT: 35.8 % — ABNORMAL LOW (ref 39.0–52.0)
Hemoglobin: 12.4 g/dL — ABNORMAL LOW (ref 13.0–17.0)
Immature Granulocytes: 5 %
Lymphocytes Relative: 51 %
Lymphs Abs: 7.2 10*3/uL — ABNORMAL HIGH (ref 0.7–4.0)
MCH: 31.7 pg (ref 26.0–34.0)
MCHC: 34.6 g/dL (ref 30.0–36.0)
MCV: 91.6 fL (ref 80.0–100.0)
Monocytes Absolute: 1 10*3/uL (ref 0.1–1.0)
Monocytes Relative: 7 %
Neutro Abs: 4.8 10*3/uL (ref 1.7–7.7)
Neutrophils Relative %: 35 %
Platelets: 114 10*3/uL — ABNORMAL LOW (ref 150–400)
RBC: 3.91 MIL/uL — ABNORMAL LOW (ref 4.22–5.81)
RDW: 12.6 % (ref 11.5–15.5)
Smear Review: NORMAL
WBC: 14 10*3/uL — ABNORMAL HIGH (ref 4.0–10.5)
nRBC: 0 % (ref 0.0–0.2)

## 2023-11-09 LAB — LACTATE DEHYDROGENASE: LDH: 162 U/L (ref 98–192)

## 2023-11-09 MED ORDER — HEPARIN SOD (PORK) LOCK FLUSH 100 UNIT/ML IV SOLN
500.0000 [IU] | Freq: Once | INTRAVENOUS | Status: AC
  Administered 2023-11-09: 500 [IU] via INTRAVENOUS
  Filled 2023-11-09: qty 5

## 2023-11-09 MED ORDER — SODIUM CHLORIDE 0.9% FLUSH
10.0000 mL | Freq: Once | INTRAVENOUS | Status: AC
Start: 2023-11-09 — End: 2023-11-09
  Administered 2023-11-09: 10 mL via INTRAVENOUS
  Filled 2023-11-09: qty 10

## 2023-11-09 NOTE — Progress Notes (Signed)
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 [name]  He sustained a left anterior frontal sinus as well as supraorbital rim fracture on 11/21/2019 which was managed conservatively.  He then presented to the ER at [facility] with symptoms of right-sided chest wall pain this led to a CT PE which did not show any evidence of pulmonary embolism.  He was noted to have bilateral symmetric axillary adenopathy measuring up to 1.8 cm.  Scattered mediastinal adenopathy.  Right paratracheal node measuring up to 1.2 cm.  Prominent right costophrenic lymph node measuring up to 1 cm.  Findings are nonspecific but could be seen with lymphoma versus systemic inflammatory disease such as sarcoidosis.  Of note patient has had a prior CT scan for lung screening back in 2015 when he was not noted to have this adenopathy.    Repeat CT chest in August 2021 showed persistent mild nonbulky axillary and mediastinal adenopathy which had not changed significantly as compared to his prior CT in December 2020.  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   Repeat CT in March 2022  showed Progression and multistation adenopathy as well as significant splenomegaly measuring 20.4 cm as compared to 14.5 cm on CT scan September 2021.  CT scan showed multistation adenopathy with SUVs ranging between 6-8.9.  Patient underwent another core biopsy of right inguinal lymph node which was consistent with follicular lymphoma grade 1-2.  Bone marrow biopsy also showed moderate involvement with non-Hodgkin's B-cell lymphoma.  Baseline hepatitis B testing showed core antibody positivity.  Seen by GI and started on tenofovir   Patient had symptoms of nausea and sweating during cycle 1 of Rituxan which resolved with additional premedications.  He developed symptoms of itching over neck chest back and bilateral eyes with second dose of Rituxan early and had to get more premedications and was not rechallenged on the same day.  Patient subsequently received cycle 3 of Bendamustine Rituxan chemotherapy after Rituxan was given as a split dose over 3 days   Scans after 6 cycles of Bendamustine Rituxan chemotherapy in October 2022 showed significant improvement with therapy.  Lymphadenopathy in his neck and mediastinum resolved Deauville 2.  Decrease in the splenic size and hypermetabolism.  No new lesions.  FDG accumulation within the skeleton presumably due to stimulatory effects of treatment.   Scans in April 2023 showed pulmonary nodules requiring further assessment and patient was seen by pulmonary.  He also had a PET CT scan in October 2023 which showed gradual enlargement and hypermetabolic some noted in the lung nodules.  Patient also has intra-abdominal lymph nodes ranging from 9 to 1.2 cm with an SUV less than 3.  Patient had right lower lobe lung biopsy which was negative for malignancy.  Interval history- he feels well. Appetite and weight has remained stable. Denies any abdominal pain or fullness  ECOG PS- 1 Pain scale- 0   Review of systems- Review of Systems  Constitutional:  Negative  for chills, fever, malaise/fatigue and weight loss.  HENT:  Negative for congestion, ear discharge and nosebleeds.   Eyes:  Negative for blurred vision.  Respiratory:  Negative for cough, hemoptysis, sputum production, shortness of breath and wheezing.   Cardiovascular:  Negative for chest pain, palpitations, orthopnea and claudication.  Gastrointestinal:  Negative for abdominal pain, blood in stool, constipation, diarrhea, heartburn, melena, nausea and vomiting.  Genitourinary:  Negative for dysuria, flank pain, frequency, hematuria and urgency.  Musculoskeletal:  Negative for back pain, joint pain and myalgias.  Skin:  Negative for rash.  Neurological:  Negative for dizziness, tingling, focal weakness, seizures, weakness and headaches.  Endo/Heme/Allergies:  Does not bruise/bleed easily.  Psychiatric/Behavioral:  Negative for depression and suicidal ideas. The patient does not have insomnia.       Allergies  Allergen Reactions   Penicillins Anaphylaxis    Tolerates cefdinir, cephalexin and amoxicillin/clavulonate so true PCN allergy unlikely   Tetanus Toxoids Swelling   Lisinopril Cough   Losartan      Other reaction(s): Muscle Pain     Codeine Nausea Only and Nausea And Vomiting   Doxycycline Rash   Ruxience [Rituximab-Pvvr] Rash    05/06/21 pt w/ worsening rash during Ruxience infusion.  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HCC)    Arthritis    Asthma    Basal cell carcinoma 01/28/2021   R upper arm, EDC 03/04/2021   Basal cell carcinoma 12/31/2022   Right temporal hair line. Nodular. 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   left upper back ED&C done   Hypertension    Hyponatremia    Inappropriate sinus tachycardia (HCC)    Non Hodgkin's lymphoma (HCC)    Pneumonia    RSV (respiratory syncytial virus infection)    SCC (squamous cell carcinoma) 08/28/2021   upper chest right of midline, EDC done 09/17/2021   SCC (squamous cell carcinoma) 09/30/2022   left chest  ED&C done   Sleep apnea    does not wear CPAP ; uses humidifier instead   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   right upper back, ED&C 06/18/2022   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   right upper back ED&C done   Squamous cell carcinoma of skin 02/19/2021   L inferior mandible, treated with EDC   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/28/2021   R ant shoulder - ED&C   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/28/2021   L upper  abdomen [facility]     Past Surgical History:  Procedure Laterality Date   ANTERIOR CERVICAL DECOMP/DISCECTOMY FUSION N/A 06/17/2016   Procedure: ANTERIOR CERVICAL DECOMPRESSION FUSION, CERVICAL 3-4, CERVICAL 4-5 WITH INSTRUMENTATION AND ALLOGRAFT;  Surgeon: Estill Bamberg, MD;  Location: [facility] OR;  Service: Orthopedics;  Laterality: N/A;  ANTERIOR CERVICAL DECOMPRESSION FUSION, CERVICAL 3-4, CERVICAL 4-5 WITH INSTRUMENTATION AND ALLOGRAFT   BACK SURGERY     x3   HARDWARE REMOVAL Right 05/04/2023   Procedure: HARDWARE REMOVAL;  Surgeon: Juanell Fairly, MD;  Location: [facility] ORS;  Service: Orthopedics;  Laterality: Right;   NECK SURGERY  12/01/2007   ORIF ANKLE FRACTURE Right 12/02/2022   Procedure: OPEN REDUCTION INTERNAL FIXATION (ORIF) ANKLE FRACTURE;  Surgeon: Juanell Fairly, MD;  Location: [facility] ORS;  Service: Orthopedics;  Laterality: Right;   ORIF ANKLE FRACTURE Right 12/01/2022   Procedure: OPEN REDUCTION INTERNAL FIXATION (ORIF) ANKLE FRACTURE;  Surgeon: Juanell Fairly, MD;  Location: [facility] ORS;  Service: Orthopedics;  Laterality: Right;   PORTA CATH INSERTION N/A 04/03/2021   Procedure: PORTA CATH INSERTION;  Surgeon: Annice Needy, MD;  Location: [facility] INVASIVE CV LAB;  Service: Cardiovascular;  Laterality: N/A;   VIDEO BRONCHOSCOPY WITH ENDOBRONCHIAL ULTRASOUND Bilateral 10/14/2022   Procedure: VIDEO BRONCHOSCOPY WITH ENDOBRONCHIAL ULTRASOUND;  Surgeon: Salena Saner, MD;  Location: [facility] ORS;  Service: Pulmonary;  Laterality: Bilateral;    Social History   Socioeconomic History   Marital status: Divorced    Spouse name: Not on file   Number of children: Not on file   Years of education: Not on file   Highest education level: Not on file  Occupational History   Not on file  Tobacco Use   Smoking status: Former    Types: Cigars    Quit date: 11/30/2022    Years since quitting: 0.9   Smokeless tobacco: Never  Vaping Use   Vaping status: Former   Start date: 04/30/2017   Quit date: 11/30/2017  Substance and Sexual Activity   Alcohol use: Yes    Comment: beers 6 a day   Drug use: No   Sexual activity: Not Currently  Other Topics Concern   Not on file  Social History Narrative   Not on file   Social Determinants of Health   Financial Resource Strain: Medium Risk (04/05/2023)   Overall Financial Resource Strain (CARDIA)    Difficulty of Paying Living Expenses: Somewhat hard  Food Insecurity: Food Insecurity Present (05/07/2023)   [facility]   Hunger Vital Sign    Worried About Running Out of Food in the Last Year: Sometimes true    Ran Out of Food in the Last Year: Sometimes true  Transportation Needs: No Transportation Needs (04/05/2023)   PRAPARE - Administrator, Civil Service (Medical): No     Lack of Transportation (Non-Medical): No  Physical Activity: Inactive (01/01/2023)   Exercise Vital Sign    Days of Exercise per Week: 0 days    Minutes of Exercise per Session: 0 min  Stress: No Stress Concern Present (04/05/2023)   [name] of Occupational Health - Occupational Stress Questionnaire    Feeling of Stress : Only a little  Social Connections: Socially Isolated (04/05/2023)   Social Connection and Isolation Panel [NHANES]    Frequency of Communication with Friends and Family: More than three times a week    Frequency of Social Gatherings with Friends and Family: Twice a week  Attends Religious Services: Never    Active Member of Clubs or Organizations: No    Attends Banker Meetings: Never    Marital Status: Divorced  Catering manager Violence: Not At Risk (04/05/2023)   Humiliation, Afraid, Rape, and Kick questionnaire    Fear of Current or Ex-Partner: No    Emotionally Abused: No    Physically Abused: No    Sexually Abused: No    Family History  Problem Relation Age of Onset   Diabetes Mother    Heart attack Father    Emphysema Father    Diabetes Other      Current Outpatient Medications:    amLODipine (NORVASC) 2.5 MG tablet, TAKE 1 TABLET BY MOUTH ONCE DAILY, Disp: 90 tablet, Rfl: 0   atorvastatin (LIPITOR) 80 MG tablet, Take 1 tablet (80 mg total) by mouth daily., Disp: 90 tablet, Rfl: 0   Blood Glucose Monitoring Suppl (ONE TOUCH ULTRA 2) w/Device KIT, 1 each by Does not apply route daily., Disp: 1 kit, Rfl: 0   buPROPion (WELLBUTRIN SR) 150 MG 12 hr tablet, Take 150 mg by mouth 2 (two) times daily., Disp: , Rfl:    cetirizine (ZYRTEC) 10 MG tablet, Take 1 tablet (10 mg total) by mouth daily., Disp: 30 tablet, Rfl: 11   clonazePAM (KLONOPIN) 0.5 MG tablet, Take 0.5 mg by mouth every evening., Disp: , Rfl:    DULoxetine (CYMBALTA) 60 MG capsule, Take 60 mg by mouth every evening., Disp: , Rfl:    famotidine (PEPCID) 20 MG tablet, Take 1  tablet (20 mg total) by mouth 2 (two) times daily., Disp: 180 tablet, Rfl: 1   fluticasone (FLONASE) 50 MCG/ACT nasal spray, Place 2 sprays into both nostrils daily., Disp: 16 g, Rfl: 6   glucose blood (ONETOUCH ULTRA) test strip, 1 each by Other route daily. Use as instructed, Disp: 100 each, Rfl: 3   Lancets (ONETOUCH ULTRASOFT) lancets, 1 each by Other route daily. Use as instructed, Disp: 100 each, Rfl: 3   metoprolol succinate (TOPROL-XL) 100 MG 24 hr tablet, TAKE 1 TABLET BY MOUTH ONCE DAILY WITH OR IMMEDIATELY FOLLOWING A MEAL, Disp: 90 tablet, Rfl: 0   mirtazapine (REMERON) 15 MG tablet, Take 15 mg by mouth at bedtime., Disp: , Rfl:    montelukast (SINGULAIR) 10 MG tablet, Take 1 tablet (10 mg total) by mouth daily., Disp: 90 tablet, Rfl: 0   naltrexone (DEPADE) 50 MG tablet, Take 50 mg by mouth daily., Disp: , Rfl:    OLANZapine (ZYPREXA) 5 MG tablet, Take 5 mg by mouth at bedtime., Disp: , Rfl:    ondansetron (ZOFRAN) 4 MG tablet, Take 1 tablet (4 mg total) by mouth every 8 (eight) hours as needed for nausea or vomiting., Disp: 20 tablet, Rfl: 0   sildenafil (VIAGRA) 100 MG tablet, Take 1 tablet (100 mg total) by mouth daily as needed for erectile dysfunction. Take two hours prior to intercourse on an empty stomach, Disp: 30 tablet, Rfl: 3   tenofovir (VIREAD) 300 MG tablet, TAKE 1 TABLET BY MOUTH ONCE DAILY, Disp: 90 tablet, Rfl: 3   testosterone cypionate (DEPOTESTOSTERONE CYPIONATE) 200 MG/ML injection, Inject 1 mL (200 mg total) into the muscle every 14 (fourteen) days. INJECT INTRAMUSCULARLY EVERY 14 DAYS, Disp: 10 mL, Rfl: 0   albuterol (VENTOLIN HFA) 108 (90 Base) MCG/ACT inhaler, Inhale 2 puffs into the lungs every 6 (six) hours as needed for wheezing or shortness of breath. (Patient not taking: Reported on 11/02/2023), Disp: 18  g, Rfl: 6   finasteride (PROSCAR) 5 MG tablet, TAKE 1 TABLET BY MOUTH ONCE DAILY (Patient not taking: Reported on 11/09/2023), Disp: 90 tablet, Rfl: 3    Fluticasone-Umeclidin-Vilant (TRELEGY ELLIPTA) 200-62.5-25 MCG/ACT AEPB, Inhale 1 puff into the lungs daily. (Patient not taking: Reported on 11/09/2023), Disp: 14 each, Rfl: 0   levofloxacin (LEVAQUIN) 500 MG tablet, Take 1 tablet (500 mg total) by mouth daily for 7 days. (Patient not taking: Reported on 11/09/2023), Disp: 7 tablet, Rfl: 0 No current facility-administered medications for this visit.  Facility-Administered Medications Ordered in Other Visits:    heparin lock flush 100 unit/mL, 500 Units, Intravenous, Once, Creig Hines, MD   heparin lock flush 100 unit/mL, 500 Units, Intravenous, Once, Creig Hines, MD   sodium chloride flush (NS) 0.9 % injection 10 mL, 10 mL, Intravenous, PRN, Creig Hines, MD  Physical exam:  Vitals:   11/09/23 1410  BP: 124/84  Pulse: 96  Resp: 19  Temp: 97.6 F (36.4 C)  SpO2: 97%  Weight: 238 lb 3.2 oz (108 kg)   Physical Exam Cardiovascular:     Rate and Rhythm: Normal rate and regular rhythm.     Heart sounds: Normal heart sounds.  Pulmonary:     Effort: Pulmonary effort is normal.     Breath sounds: Normal breath sounds.  Abdominal:     General: Bowel sounds are normal.     Palpations: Abdomen is soft.  Lymphadenopathy:     Comments: No palpable cervical, supraclavicular, axillary or inguinal adenopathy    Skin:    General: Skin is warm and dry.  Neurological:     Mental Status: He is alert and oriented to person, place, and time.         Latest Ref Rng & Units 11/09/2023    1:54 PM  CMP  Glucose 70 - 99 mg/dL 562   BUN 8 - 23 mg/dL 11   Creatinine 1.30 - 1.24 mg/dL 8.65   Sodium 784 - 696 mmol/L 135   Potassium 3.5 - 5.1 mmol/L 4.2   Chloride 98 - 111 mmol/L 100   CO2 22 - 32 mmol/L 24   Calcium 8.9 - 10.3 mg/dL 8.9   Total Protein 6.5 - 8.1 g/dL 6.5   Total Bilirubin <2.9 mg/dL 0.8   Alkaline Phos 38 - 126 U/L 148   AST 15 - 41 U/L 29   ALT 0 - 44 U/L 22       Latest Ref Rng & Units 11/09/2023    1:54 PM   CBC  WBC 4.0 - 10.5 K/uL 14.0   Hemoglobin 13.0 - 17.0 g/dL 52.8   Hematocrit 41.3 - 52.0 % 35.8   Platelets 150 - 400 K/uL 114      Assessment and plan- Patient is a 62 y.o. male with history of recurrent follicular lymphoma here to discuss MRI spine results and further management  Patient had a PET scan in September 2024 which showed splenomegaly of 19.3 cm with an SUV of 5.6 as well as nonbulky but extensive abdominopelvic adenopathy.  We had discussed initiating treatment for his recurrent follicular lymphoma at some point in the near future.  He had a recent MRI lumbar spine done on 09/29/2023 which also showed splenomegaly and retroperitoneal adenopathy.  I am planning to get a CT chest abdomen pelvis with contrast in December 2024 and based on the scans I will consider initiating treatment for second line follicular lymphoma likely with Rituxan Revlimid combination.  Clinically he does not have any B symptoms and weight has remained stable.  No palpable cervical axillary or inguinal adenopathy.  Spleen is palpable 2 fingerbreadths below his costal margin.LDH is normal and labs are otherwise unremarkable.  Patient continues to drink 6 to 12 packs of beer per day and is not willing to cut down any further.  This could come in his way of future third line treatment such as CAR-T cell therapy as planned.    Visit Diagnosis 1. 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 Owens Shark, MD, MPH [facility] [phone] 11/09/2023 3:30 PM

## 2023-11-10 NOTE — Telephone Encounter (Signed)
Left detailed message on patient voicemail advising him of previous recommendations.

## 2023-11-18 ENCOUNTER — Other Ambulatory Visit: Payer: Self-pay | Admitting: Physician Assistant

## 2023-11-19 NOTE — Telephone Encounter (Signed)
Requested Prescriptions  Pending Prescriptions Disp Refills   montelukast (SINGULAIR) 10 MG tablet [Pharmacy Med Name: Montelukast Sodium Oral Tablet 10 MG] 90 tablet 3    Sig: TAKE 1 TABLET EVERY DAY     Pulmonology:  Leukotriene Inhibitors Passed - 11/19/2023 10:00 AM      Passed - Valid encounter within last 12 months    Recent Outpatient Visits           2 weeks ago Community acquired pneumonia, unspecified laterality   [facility] Jackolyn Confer, MD   1 month ago Mouth ulcer   [facility] [name], NP   4 months ago 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)   [facility] [place], Corrie Dandy T, NP   5 months ago Paresthesia of foot, bilateral   [facility], Sherran Needs, NP   8 months ago Seasonal allergic rhinitis due to pollen   [facility] [name], NP       Future Appointments             In 1 month Elie Goody, MD [facility]   In 2 months [name], NP [place] [facility]

## 2023-11-22 ENCOUNTER — Telehealth: Payer: Self-pay | Admitting: Gastroenterology

## 2023-11-22 ENCOUNTER — Ambulatory Visit
Admission: RE | Admit: 2023-11-22 | Discharge: 2023-11-22 | Disposition: A | Discharge status: Home / Self Care | Payer: Medicare HMO | Source: Ambulatory Visit | Attending: Oncology | Admitting: Oncology

## 2023-11-22 DIAGNOSIS — C8200 Follicular lymphoma grade I, unspecified site: Secondary | ICD-10-CM | POA: Diagnosis not present

## 2023-11-22 DIAGNOSIS — C8202 Follicular lymphoma grade I, intrathoracic lymph nodes: Secondary | ICD-10-CM | POA: Insufficient documentation

## 2023-11-22 DIAGNOSIS — I700 Atherosclerosis of aorta: Secondary | ICD-10-CM | POA: Diagnosis not present

## 2023-11-22 DIAGNOSIS — R590 Localized enlarged lymph nodes: Secondary | ICD-10-CM | POA: Diagnosis not present

## 2023-11-22 MED ORDER — BARIUM SULFATE 2 % PO SUSP
900.0000 mL | Freq: Once | ORAL | Status: AC
  Administered 2023-11-22: 900 mL via ORAL

## 2023-11-22 MED ORDER — IOHEXOL 300 MG/ML  SOLN
100.0000 mL | Freq: Once | INTRAMUSCULAR | Status: AC | PRN
  Administered 2023-11-22: 100 mL via INTRAVENOUS

## 2023-11-22 NOTE — Telephone Encounter (Signed)
Patient called left a voicemail to schedule his colonoscopy. I returned the patient call ,and there was no answer I left a message for him to call back.

## 2023-11-22 NOTE — Telephone Encounter (Signed)
Patient advised.

## 2023-11-25 ENCOUNTER — Other Ambulatory Visit: Payer: Self-pay

## 2023-11-25 MED ORDER — ATORVASTATIN CALCIUM 80 MG PO TABS: 80.0000 mg | ORAL_TABLET | Freq: Every day | ORAL | 0 refills | Status: DC

## 2023-11-26 ENCOUNTER — Inpatient Hospital Stay (HOSPITAL_BASED_OUTPATIENT_CLINIC_OR_DEPARTMENT_OTHER): Payer: Medicare HMO | Admitting: Oncology

## 2023-11-26 ENCOUNTER — Other Ambulatory Visit: Payer: Self-pay

## 2023-11-26 ENCOUNTER — Encounter: Payer: Self-pay | Admitting: Oncology

## 2023-11-26 ENCOUNTER — Telehealth: Payer: Self-pay

## 2023-11-26 ENCOUNTER — Other Ambulatory Visit (HOSPITAL_COMMUNITY): Payer: Self-pay

## 2023-11-26 ENCOUNTER — Telehealth: Payer: Self-pay | Admitting: Pharmacist

## 2023-11-26 VITALS — BP 123/70 | HR 80 | Temp 98.5°F | Resp 18 | Wt 244.5 lb

## 2023-11-26 DIAGNOSIS — Z1211 Encounter for screening for malignant neoplasm of colon: Secondary | ICD-10-CM

## 2023-11-26 DIAGNOSIS — R195 Other fecal abnormalities: Secondary | ICD-10-CM

## 2023-11-26 DIAGNOSIS — Z7189 Other specified counseling: Secondary | ICD-10-CM | POA: Diagnosis not present

## 2023-11-26 DIAGNOSIS — Z87891 Personal history of nicotine dependence: Secondary | ICD-10-CM | POA: Diagnosis not present

## 2023-11-26 DIAGNOSIS — C8202 Follicular lymphoma grade I, intrathoracic lymph nodes: Secondary | ICD-10-CM

## 2023-11-26 MED ORDER — LENALIDOMIDE 15 MG PO CAPS: 15.0000 mg | ORAL_CAPSULE | Freq: Every day | ORAL | 0 refills | Status: DC

## 2023-11-26 MED ORDER — NA SULFATE-K SULFATE-MG SULF 17.5-3.13-1.6 GM/177ML PO SOLN: 1.0000 | Freq: Once | ORAL | 0 refills | Status: AC

## 2023-11-26 NOTE — Telephone Encounter (Signed)
Oral Oncology Patient Advocate Encounter  New authorization   Received notification that prior authorization for Revlimid is required.   PA submitted on 11/26/23  Key [id number]  Status is pending     Ardeen Fillers, CPhT Oncology Pharmacy Patient Advocate  [facility]  [phone] (phone) [phone] (fax) 11/26/2023 10:29 AM

## 2023-11-26 NOTE — Telephone Encounter (Signed)
Clinical Pharmacist Practitioner Encounter   Received new prescription for Revlimid (lenalidomide) for the treatment of recurrent stage IV grade 1 low grade follicular lymphoma in conjunction with rituxamab. Lenalidomide is planned to be given for up to12 cycles.  CMP from 11/09/23 assessed, no relevant lab abnormalities. Prescription dose and frequency assessed.   Current medication list in Epic reviewed, no DDIs with lenalidomide identified.  Evaluated chart and no patient barriers to medication adherence identified.   Oral Oncology Clinic will continue to follow for insurance authorization, copayment issues, initial counseling and start date.   Remi Haggard, PharmD, BCPS, BCOP, CPP Hematology/Oncology Clinical Pharmacist Practitioner [facility] [phone]  11/26/2023 9:48 AM

## 2023-11-26 NOTE — Telephone Encounter (Signed)
Gastroenterology Pre-Procedure Review  Request Date: 12/10/23 Requesting Physician: Dr. Allegra Lai  PATIENT REVIEW QUESTIONS: The patient responded to the following health history questions as indicated:    1. Are you having any GI issues? no GI  Issues, Positive Cologuard 1st colonoscopy 2. Do you have a personal history of Polyps? no 3. Do you have a family history of Colon Cancer or Polyps? no 4. Diabetes Mellitus? no 5. Joint replacements in the past 12 months?no 6. Major health problems in the past 3 months?no 7. Any artificial heart valves, MVP, or defibrillator?no    MEDICATIONS & ALLERGIES:    Patient reports the following regarding taking any anticoagulation/antiplatelet therapy:   Plavix, Coumadin, Eliquis, Xarelto, Lovenox, Pradaxa, Brilinta, or Effient? no Aspirin? no  Patient confirms/reports the following medications:  Current Outpatient Medications  Medication Sig Dispense Refill   albuterol (VENTOLIN HFA) 108 (90 Base) MCG/ACT inhaler Inhale 2 puffs into the lungs every 6 (six) hours as needed for wheezing or shortness of breath. (Patient not taking: Reported on 11/02/2023) 18 g 6   amLODipine (NORVASC) 2.5 MG tablet TAKE 1 TABLET BY MOUTH ONCE DAILY 90 tablet 0   atorvastatin (LIPITOR) 80 MG tablet Take 1 tablet (80 mg total) by mouth daily. 15 tablet 0   Blood Glucose Monitoring Suppl (ONE TOUCH ULTRA 2) w/Device KIT 1 each by Does not apply route daily. 1 kit 0   buPROPion (WELLBUTRIN SR) 150 MG 12 hr tablet Take 150 mg by mouth 2 (two) times daily.     cetirizine (ZYRTEC) 10 MG tablet Take 1 tablet (10 mg total) by mouth daily. 30 tablet 11   clonazePAM (KLONOPIN) 0.5 MG tablet Take 0.5 mg by mouth every evening.     DULoxetine (CYMBALTA) 60 MG capsule Take 60 mg by mouth every evening.     famotidine (PEPCID) 20 MG tablet Take 1 tablet (20 mg total) by mouth 2 (two) times daily. 180 tablet 1   finasteride (PROSCAR) 5 MG tablet TAKE 1 TABLET BY MOUTH ONCE DAILY (Patient  not taking: Reported on 11/09/2023) 90 tablet 3   fluticasone (FLONASE) 50 MCG/ACT nasal spray Place 2 sprays into both nostrils daily. 16 g 6   Fluticasone-Umeclidin-Vilant (TRELEGY ELLIPTA) 200-62.5-25 MCG/ACT AEPB Inhale 1 puff into the lungs daily. (Patient not taking: Reported on 11/09/2023) 14 each 0   glucose blood (ONETOUCH ULTRA) test strip 1 each by Other route daily. Use as instructed 100 each 3   Lancets (ONETOUCH ULTRASOFT) lancets 1 each by Other route daily. Use as instructed 100 each 3   metoprolol succinate (TOPROL-XL) 100 MG 24 hr tablet TAKE 1 TABLET BY MOUTH ONCE DAILY WITH OR IMMEDIATELY FOLLOWING A MEAL 90 tablet 0   mirtazapine (REMERON) 15 MG tablet Take 15 mg by mouth at bedtime.     montelukast (SINGULAIR) 10 MG tablet TAKE 1 TABLET EVERY DAY 90 tablet 1   naltrexone (DEPADE) 50 MG tablet Take 50 mg by mouth daily.     OLANZapine (ZYPREXA) 5 MG tablet Take 5 mg by mouth at bedtime.     ondansetron (ZOFRAN) 4 MG tablet Take 1 tablet (4 mg total) by mouth every 8 (eight) hours as needed for nausea or vomiting. 20 tablet 0   sildenafil (VIAGRA) 100 MG tablet Take 1 tablet (100 mg total) by mouth daily as needed for erectile dysfunction. Take two hours prior to intercourse on an empty stomach 30 tablet 3   tenofovir (VIREAD) 300 MG tablet TAKE 1 TABLET BY MOUTH ONCE  DAILY 90 tablet 3   testosterone cypionate (DEPOTESTOSTERONE CYPIONATE) 200 MG/ML injection Inject 1 mL (200 mg total) into the muscle every 14 (fourteen) days. INJECT INTRAMUSCULARLY EVERY 14 DAYS 10 mL 0   No current facility-administered medications for this visit.   Facility-Administered Medications Ordered in Other Visits  Medication Dose Route Frequency Provider Last Rate Last Admin   heparin lock flush 100 unit/mL  500 Units Intravenous Once Creig Hines, MD       heparin lock flush 100 unit/mL  500 Units Intravenous Once Creig Hines, MD       sodium chloride flush (NS) 0.9 % injection 10 mL  10  mL Intravenous PRN Creig Hines, MD        Patient confirms/reports the following allergies:  Allergies  Allergen Reactions   Penicillins Anaphylaxis    Tolerates cefdinir, cephalexin and amoxicillin/clavulonate so true PCN allergy unlikely   Tetanus Toxoids Swelling   Lisinopril Cough   Losartan      Other reaction(s): Muscle Pain     Codeine Nausea Only and Nausea And Vomiting   Doxycycline Rash   Ruxience [Rituximab-Pvvr] Rash    05/06/21 pt w/ worsening rash during Ruxience infusion.  Face, neck and chest flushing redness    No orders of the defined types were placed in this encounter.   AUTHORIZATION INFORMATION Primary Insurance: 1D#: Group #:  Secondary Insurance: 1D#: Group #:  SCHEDULE INFORMATION: Date: 12/10/23 Time: Location: [facility]

## 2023-11-26 NOTE — Telephone Encounter (Addendum)
Oral Oncology Patient Advocate Encounter  Prior Authorization for Revlimid has been approved.    PA# [id number]  Effective dates: 11/30/22 through 11/29/24  Patients co-pay is $4,024.78.   PAP initiated. See additional encounter.    Ardeen Fillers, CPhT Oncology Pharmacy Patient Advocate  [facility]  [phone] (phone) [phone] (fax) 11/26/2023 1:35 PM

## 2023-11-26 NOTE — Progress Notes (Unsigned)
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 [name]  He sustained a left anterior frontal sinus as well as supraorbital rim fracture on 11/21/2019 which was managed conservatively.  He then presented to the ER at [facility] with symptoms of right-sided chest wall pain this led to a CT PE which did not show any evidence of pulmonary embolism.  He was noted to have bilateral symmetric axillary adenopathy measuring up to 1.8 cm.  Scattered mediastinal adenopathy.  Right paratracheal node measuring up to 1.2 cm.  Prominent right costophrenic lymph node measuring up to 1 cm.  Findings are nonspecific but could be seen with lymphoma versus systemic inflammatory disease such as sarcoidosis.  Of note patient has had a prior CT scan for lung screening back in 2015 when he was not noted to have this adenopathy.    Repeat CT chest in August 2021 showed persistent mild nonbulky axillary and mediastinal adenopathy which had not changed significantly as compared to his prior CT in December 2020.  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   Repeat  CT in March 2022 showed Progression and multistation adenopathy as well as significant splenomegaly measuring 20.4 cm as compared to 14.5 cm on CT scan September 2021.  CT scan showed multistation adenopathy with SUVs ranging between 6-8.9.  Patient underwent another core biopsy of right inguinal lymph node which was consistent with follicular lymphoma grade 1-2.  Bone marrow biopsy also showed moderate involvement with non-Hodgkin's B-cell lymphoma.  Baseline hepatitis B testing showed core antibody positivity.  Seen by GI and started on tenofovir   Patient had symptoms of nausea and sweating during cycle 1 of Rituxan which resolved with additional premedications.  He developed symptoms of itching over neck chest back and bilateral eyes with second dose of Rituxan early and had to get more premedications and was not rechallenged on the same day.  Patient subsequently received cycle 3 of Bendamustine Rituxan chemotherapy after Rituxan was given as a split dose over 3 days   Scans after 6 cycles of Bendamustine Rituxan chemotherapy in October 2022 showed significant improvement with therapy.  Lymphadenopathy in his neck and mediastinum resolved Deauville 2.  Decrease in the splenic size and hypermetabolism.  No new lesions.  FDG accumulation within the skeleton presumably due to stimulatory effects of treatment.   Scans in April 2023 showed pulmonary nodules requiring further assessment and patient was seen by pulmonary.  He also had a PET CT scan in October 2023 which showed gradual enlargement and hypermetabolic some noted in the lung nodules.  Patient also has intra-abdominal lymph nodes ranging from 9 to 1.2 cm with an SUV less than 3.  Patient had right lower lobe lung biopsy which was negative for  malignancy.    Interval history- ***  ECOG PS- *** Pain scale- *** Opioid associated constipation- ***  Review of systems- ROS    Allergies  Allergen Reactions   Penicillins Anaphylaxis     Tolerates cefdinir, cephalexin and amoxicillin/clavulonate so true PCN allergy unlikely   Tetanus Toxoids Swelling   Lisinopril Cough   Losartan      Other reaction(s): Muscle Pain     Codeine Nausea Only and Nausea And Vomiting   Doxycycline Rash   Ruxience [Rituximab-Pvvr] Rash    05/06/21 pt w/ worsening rash during Ruxience infusion.  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HCC)    Arthritis    Asthma    Basal cell carcinoma 01/28/2021   R upper arm, EDC 03/04/2021   Basal cell carcinoma 12/31/2022   Right temporal hair line. Nodular. 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   left upper back ED&C done   Hypertension    Hyponatremia    Inappropriate sinus tachycardia (HCC)    Non Hodgkin's lymphoma (HCC)    Pneumonia    RSV (respiratory syncytial virus infection)    SCC (squamous cell carcinoma) 08/28/2021   upper chest right of midline, EDC done 09/17/2021   SCC (squamous cell carcinoma) 09/30/2022   left chest  ED&C done   Sleep apnea    does not wear CPAP ; uses humidifier instead   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   right upper back, ED&C 06/18/2022   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   right upper back ED&C done   Squamous cell carcinoma of skin 02/19/2021    L inferior mandible, treated with EDC   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/28/2021   R ant shoulder - ED&C   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/28/2021   L upper abdomen - ED&C     Past Surgical History:  Procedure Laterality Date   ANTERIOR CERVICAL DECOMP/DISCECTOMY FUSION N/A 06/17/2016   Procedure: ANTERIOR CERVICAL DECOMPRESSION FUSION, CERVICAL 3-4, CERVICAL 4-5 WITH INSTRUMENTATION AND ALLOGRAFT;  Surgeon: Estill Bamberg, MD;  Location: [facility] OR;  Service: Orthopedics;  Laterality: N/A;  ANTERIOR CERVICAL DECOMPRESSION FUSION, CERVICAL 3-4, CERVICAL 4-5 WITH INSTRUMENTATION AND ALLOGRAFT   BACK SURGERY     x3   HARDWARE REMOVAL Right 05/04/2023   Procedure: HARDWARE REMOVAL;  Surgeon: Juanell Fairly, MD;  Location: [facility] ORS;  Service: Orthopedics;  Laterality: Right;   NECK SURGERY  12/01/2007   ORIF ANKLE FRACTURE Right 12/02/2022   Procedure: OPEN REDUCTION INTERNAL FIXATION (ORIF) ANKLE FRACTURE;  Surgeon: Juanell Fairly, MD;  Location: [facility] ORS;  Service: Orthopedics;  Laterality: Right;   ORIF ANKLE FRACTURE Right 12/01/2022   Procedure:  OPEN REDUCTION INTERNAL FIXATION (ORIF) ANKLE FRACTURE;  Surgeon: Juanell Fairly, MD;  Location: [facility] ORS;  Service: Orthopedics;  Laterality: Right;   PORTA CATH INSERTION N/A 04/03/2021   Procedure: PORTA CATH INSERTION;  Surgeon: Annice Needy, MD;  Location: [facility] INVASIVE CV LAB;  Service: Cardiovascular;  Laterality: N/A;   VIDEO BRONCHOSCOPY WITH ENDOBRONCHIAL ULTRASOUND Bilateral 10/14/2022   Procedure: VIDEO BRONCHOSCOPY WITH ENDOBRONCHIAL ULTRASOUND;  Surgeon: Salena Saner, MD;  Location: [facility] ORS;  Service: Pulmonary;  Laterality: Bilateral;    Social History   Socioeconomic History   Marital status: Divorced    Spouse name: Not on file   Number of children: Not on file   Years of education: Not on file   Highest education level: Not on file  Occupational History   Not on file  Tobacco Use   Smoking status: Former     Types: Cigars    Quit date: 11/30/2022    Years since quitting: 0.9   Smokeless tobacco: Never  Vaping Use   Vaping status: Former   Start date: 04/30/2017   Quit date: 11/30/2017  Substance and Sexual Activity   Alcohol use: Yes    Comment: beers 6 a day   Drug use: No   Sexual activity: Not Currently  Other Topics Concern   Not on file  Social History Narrative   Not on file   Social Drivers of Health   Financial Resource Strain: Medium Risk (04/05/2023)   Overall Financial Resource Strain (CARDIA)    Difficulty of Paying Living Expenses: Somewhat hard  Food Insecurity: Food Insecurity Present (05/07/2023)   Received from [facility], [facility]   Hunger Vital Sign    Worried About Running Out of Food in the Last Year: Sometimes true    Ran Out of Food in the Last Year: Sometimes true  Transportation Needs: No Transportation Needs (04/05/2023)   PRAPARE - Administrator, Civil Service (Medical): No    Lack of Transportation (Non-Medical): No  Physical Activity: Inactive (01/01/2023)   Exercise Vital Sign    Days of Exercise per Week: 0 days    Minutes of Exercise per Session: 0 min  Stress: No Stress Concern Present (04/05/2023)   [name] of Occupational Health - Occupational Stress Questionnaire    Feeling of Stress : Only a little  Social Connections: Socially Isolated (04/05/2023)   Social Connection and Isolation Panel [NHANES]    Frequency of Communication with Friends and Family: More than three times a week    Frequency of Social Gatherings with Friends and Family: Twice a week    Attends Religious Services: Never    Database administrator or Organizations: No    Attends Banker Meetings: Never    Marital Status: Divorced  Catering manager Violence: Not At Risk (04/05/2023)   Humiliation, Afraid, Rape, and Kick questionnaire    Fear of Current or Ex-Partner: No    Emotionally Abused: No    Physically Abused: No    Sexually Abused: No     Family History  Problem Relation Age of Onset   Diabetes Mother    Heart attack Father    Emphysema Father    Diabetes Other      Current Outpatient Medications:    albuterol (VENTOLIN HFA) 108 (90 Base) MCG/ACT inhaler, Inhale 2 puffs into the lungs every 6 (six) hours as needed for wheezing or shortness of breath. (Patient not taking: Reported on 11/02/2023), Disp: 18  g, Rfl: 6   amLODipine (NORVASC) 2.5 MG tablet, TAKE 1 TABLET BY MOUTH ONCE DAILY, Disp: 90 tablet, Rfl: 0   atorvastatin (LIPITOR) 80 MG tablet, Take 1 tablet (80 mg total) by mouth daily., Disp: 15 tablet, Rfl: 0   Blood Glucose Monitoring Suppl (ONE TOUCH ULTRA 2) w/Device KIT, 1 each by Does not apply route daily., Disp: 1 kit, Rfl: 0   buPROPion (WELLBUTRIN SR) 150 MG 12 hr tablet, Take 150 mg by mouth 2 (two) times daily., Disp: , Rfl:    cetirizine (ZYRTEC) 10 MG tablet, Take 1 tablet (10 mg total) by mouth daily., Disp: 30 tablet, Rfl: 11   clonazePAM (KLONOPIN) 0.5 MG tablet, Take 0.5 mg by mouth every evening., Disp: , Rfl:    DULoxetine (CYMBALTA) 60 MG capsule, Take 60 mg by mouth every evening., Disp: , Rfl:    famotidine (PEPCID) 20 MG tablet, Take 1 tablet (20 mg total) by mouth 2 (two) times daily., Disp: 180 tablet, Rfl: 1   finasteride (PROSCAR) 5 MG tablet, TAKE 1 TABLET BY MOUTH ONCE DAILY (Patient not taking: Reported on 11/09/2023), Disp: 90 tablet, Rfl: 3   fluticasone (FLONASE) 50 MCG/ACT nasal spray, Place 2 sprays into both nostrils daily., Disp: 16 g, Rfl: 6   Fluticasone-Umeclidin-Vilant (TRELEGY ELLIPTA) 200-62.5-25 MCG/ACT AEPB, Inhale 1 puff into the lungs daily. (Patient not taking: Reported on 11/09/2023), Disp: 14 each, Rfl: 0   glucose blood (ONETOUCH ULTRA) test strip, 1 each by Other route daily. Use as instructed, Disp: 100 each, Rfl: 3   Lancets (ONETOUCH ULTRASOFT) lancets, 1 each by Other route daily. Use as instructed, Disp: 100 each, Rfl: 3   metoprolol succinate (TOPROL-XL) 100  MG 24 hr tablet, TAKE 1 TABLET BY MOUTH ONCE DAILY WITH OR IMMEDIATELY FOLLOWING A MEAL, Disp: 90 tablet, Rfl: 0   mirtazapine (REMERON) 15 MG tablet, Take 15 mg by mouth at bedtime., Disp: , Rfl:    montelukast (SINGULAIR) 10 MG tablet, TAKE 1 TABLET EVERY DAY, Disp: 90 tablet, Rfl: 1   naltrexone (DEPADE) 50 MG tablet, Take 50 mg by mouth daily., Disp: , Rfl:    OLANZapine (ZYPREXA) 5 MG tablet, Take 5 mg by mouth at bedtime., Disp: , Rfl:    ondansetron (ZOFRAN) 4 MG tablet, Take 1 tablet (4 mg total) by mouth every 8 (eight) hours as needed for nausea or vomiting., Disp: 20 tablet, Rfl: 0   sildenafil (VIAGRA) 100 MG tablet, Take 1 tablet (100 mg total) by mouth daily as needed for erectile dysfunction. Take two hours prior to intercourse on an empty stomach, Disp: 30 tablet, Rfl: 3   tenofovir (VIREAD) 300 MG tablet, TAKE 1 TABLET BY MOUTH ONCE DAILY, Disp: 90 tablet, Rfl: 3   testosterone cypionate (DEPOTESTOSTERONE CYPIONATE) 200 MG/ML injection, Inject 1 mL (200 mg total) into the muscle every 14 (fourteen) days. INJECT INTRAMUSCULARLY EVERY 14 DAYS, Disp: 10 mL, Rfl: 0 No current facility-administered medications for this visit.  Facility-Administered Medications Ordered in Other Visits:    heparin lock flush 100 unit/mL, 500 Units, Intravenous, Once, Creig Hines, MD   heparin lock flush 100 unit/mL, 500 Units, Intravenous, Once, Creig Hines, MD   sodium chloride flush (NS) 0.9 % injection 10 mL, 10 mL, Intravenous, PRN, Creig Hines, MD  Physical exam: There were no vitals filed for this visit. Physical Exam      Latest Ref Rng & Units 11/09/2023    1:54 PM  CMP  Glucose 70 -  99 mg/dL 578   BUN 8 - 23 mg/dL 11   Creatinine 4.69 - 1.24 mg/dL 6.29   Sodium 528 - 413 mmol/L 135   Potassium 3.5 - 5.1 mmol/L 4.2   Chloride 98 - 111 mmol/L 100   CO2 22 - 32 mmol/L 24   Calcium 8.9 - 10.3 mg/dL 8.9   Total Protein 6.5 - 8.1 g/dL 6.5   Total Bilirubin <2.4 mg/dL 0.8    Alkaline Phos 38 - 126 U/L 148   AST 15 - 41 U/L 29   ALT 0 - 44 U/L 22       Latest Ref Rng & Units 11/09/2023    1:54 PM  CBC  WBC 4.0 - 10.5 K/uL 14.0   Hemoglobin 13.0 - 17.0 g/dL 40.1   Hematocrit 02.7 - 52.0 % 35.8   Platelets 150 - 400 K/uL 114     No images are attached to the encounter.  CT CHEST ABDOMEN PELVIS W CONTRAST Result Date: 11/22/2023 CLINICAL DATA:  Grade 1 follicular lymphoma, follow-up. * Tracking Code: BO * EXAM: CT CHEST, ABDOMEN, AND PELVIS WITH CONTRAST TECHNIQUE: Multidetector CT imaging of the chest, abdomen and pelvis was performed following the standard protocol during bolus administration of intravenous contrast. RADIATION DOSE REDUCTION: This exam was performed according to the departmental dose-optimization program which includes automated exposure control, adjustment of the mA and/or kV according to patient size and/or use of iterative reconstruction technique. CONTRAST:  OMNIPAQUE IOHEXOL 300 MG/ML  SOLN COMPARISON:  Multiple priors including PET-CT August 30, 2023 and CT abdomen pelvis March 30, 2023. FINDINGS: CT CHEST FINDINGS Cardiovascular: Right chest Port-A-Cath with tip in the SVC. Aortic atherosclerosis. No central pulmonary embolus on this nondedicated study. Normal size heart. Coronary artery calcifications Mediastinum/Nodes: Similar size of the supraclavicular, mediastinal, hilar pericardiophrenic and axillary lymph nodes. For reference: -right axillary lymph node measures 13 mm in short axis on image 15/2, unchanged. -high right paratracheal lymph node measures 16 mm in short axis on image 15/2, unchanged. -subcarinal lymph node measures 18 mm in short axis on image 31/2 previously 17 mm. No suspicious thyroid nodule. The esophagus is grossly unremarkable. Lungs/Pleura: Stable subpleural bilateral lower lobe pulmonary nodules which were non hypermetabolic on prior PET-CT for instance a 14 mm nodule in the right lower lobe on image 118/3.  New scattered patchy opacities in the dependent bilateral lower lobes. Musculoskeletal: No aggressive lytic or blastic lesion of bone. Bilateral rib fractures of varying degrees of chronicity are similar to CT March 30, 2023. CT ABDOMEN PELVIS FINDINGS Hepatobiliary: No suspicious hepatic lesion. Gallbladder is unremarkable. No biliary ductal dilation. Pancreas: No pancreatic ductal dilation or evidence of acute inflammation. Spleen: Similar splenomegaly measuring 20 cm in maximum craniocaudal dimension. Adrenals/Urinary Tract: Bilateral adrenal glands appear normal. Right upper pole renal cyst is considered benign requiring no independent imaging follow-up. Similar perinephric stranding. Urinary bladder is unremarkable for degree of distension. Stomach/Bowel: Radiopaque enteric contrast material traverses the rectum. Stomach is unremarkable for degree of distension. No pathologic dilation of small or large bowel. Normal appendix. No evidence of acute bowel inflammation. Vascular/Lymphatic: Aortic atherosclerosis. Normal caliber abdominal aorta. Smooth IVC contours. The portal, splenic and superior mesenteric veins are patent. Some of the abdominopelvic lymph nodes are stable in size and others have increased in size. For reference: -portacaval lymph node measures 18 mm in short axis on image 67/2, unchanged -left periaortic lymph node measures 3.4 cm in short axis on image 84/2 previously 2.9 cm -right  external iliac lymph node measures 2.1 cm in short axis on image 121/2 previously 2.2 cm -left inguinal lymph node measures 14 mm in short axis on image 126/2 previously 15 mm. Reproductive: Prostate gland is atrophic or surgically absent. Other: Mesenteric and retroperitoneal stranding favored sequela of lymphatic flow impedance. Musculoskeletal: No aggressive lytic or blastic lesion of bone. Multilevel degenerative changes spine. IMPRESSION: 1. Mixed response to therapy with some of the abdominopelvic lymph nodes  increased in size and others stable. Overall stable thoracic adenopathy. 2. Similar splenomegaly. 3. New scattered patchy opacities in the dependent bilateral lower lobes, favored infectious or inflammatory. Consider follow-up on dedicated short-term interval follow-up chest CT. 4. Stable subpleural bilateral lower lobe pulmonary nodules which were non hypermetabolic on prior PET-CT. 5.  Aortic Atherosclerosis (ICD10-I70.0). Electronically Signed   By: Maudry Mayhew M.D.   On: 11/22/2023 13:13     Assessment and plan- Patient is a 62 y.o. male here to discuss management of recurrent follicular lymphoma  I have reviewed CT chest abdomen pelvis with contrast images from 11/22/2023 independently.  Patient continues to have diffuse adenopathy both above and below the diaphragm.  Supraclavicular mediastinal and axillary lymph nodes have been stable as compared to September 2024 and are nonbulky.  He does however have some increasing intra-abdominal adenopathy.  Left periaortic lymph node which was previously 2.9 cm is now 3.4 cm.  He has other scattered areas of inguinal external iliac and portacaval adenopathy.  Also his spleen is similar in size measuring about 20 cm these areas had an SUV uptake of 4-7 on his PET scan in September 2024 and therefore not overtly suggestive of diffuse large B-cell lymphoma transformation.  I do feel that given his relatively bulky splenomegaly as well as increasing intra-abdominal adenopathy we are at a point where we should be offering second line treatment for his follicular lymphoma.  I would favor combination Rituxan and Revlimid for him.  Rituxan will be given weekly for 4 weeks for cycle 1 and then once a month for cycles 2-6.  Revlimid will be givenAt 15 mg daily 3 weeks on and 1 week off for up to 12 cycles.  Discussed risks and benefits of Revlimid including all but not limited to nausea vomiting low blood counts, leg swelling and rash diarrhea and risk of  thromboembolism.  He needs to be on aspirin 81 mg daily for thromboprophylaxis.  Discussed risks and benefits of Rituxan including all but not limited to possible risk of infusion reactions.  Patient understands and agrees to proceed as planned.  Will tentatively plan to start treatment in 2 weeks time  A multicenter, placebo-controlled randomized trial (AUGMENT) evaluated the addition of lenalidomide to rituximab in 358 patients with relapsed and/?r refractory [place] ?r marginal zone lym?hom? [15]. Patients received l???lid?mide ?r placebo for 12 cycles plus rit??imab once per week for four weeks in cycle 1 and on the first day of cycles 2 through 5. The addition of l???lid?mid? resulted in:  Improved PFS (median 39 versus 14 months; HR 0.46, 95% CI 0.34-0.62). Data regarding OS are immature (estimated two-year OS 93 versus 87 percent; HR 0.61, 95% CI 0.33-1.13).   Visit Diagnosis No diagnosis found.   Dr. Owens Shark, MD, MPH [facility] [phone] 11/26/2023 8:36 AM

## 2023-11-26 NOTE — Telephone Encounter (Signed)
Oral Oncology Patient Advocate Encounter   Began application for assistance for Revlimid through [employer] [facility] [employer].   Application will be submitted upon completion of necessary supporting documentation.   [facility] Access Support's phone number [phone].   I will continue to check the status until final determination.    Ardeen Fillers, CPhT Oncology Pharmacy Patient Advocate  [facility]  [phone] (phone) [phone] (fax) 11/26/2023 3:11 PM

## 2023-11-26 NOTE — Telephone Encounter (Signed)
Reviewed lenalidomide REMs program and enrollment with patient.

## 2023-11-26 NOTE — Telephone Encounter (Signed)
Patient called in to schedule his colonoscopy.

## 2023-11-26 NOTE — Telephone Encounter (Signed)
Patient states that he was notified by his pharmacy that his insurance does not cover Revlimid. They will cover a generic. Please call him to advize.

## 2023-11-26 NOTE — Telephone Encounter (Signed)
Oral Oncology Patient Advocate Encounter  Reached out and spoke with patient regarding PAP paperwork, explained that I would send it to their preferred email via [employer].   Confirmed email address: [email or web address].    Patient expressed understanding and consent.  Will follow up once paperwork has been signed and returned.   Ardeen Fillers, CPhT Oncology Pharmacy Patient Advocate  [facility]  [phone] (phone) [phone] (fax) 11/26/2023 3:12 PM

## 2023-11-26 NOTE — Progress Notes (Signed)
DISCONTINUE ON PATHWAY REGIMEN - Lymphoma and CLL     A cycle is every 28 days:     Rituximab-xxxx      Bendamustine   **Always confirm dose/schedule in your pharmacy ordering system**  PRIOR TREATMENT: [id number]: Bendamustine + Rituximab IV (90/375) q28 Days x 6 Cycles  START ON PATHWAY REGIMEN - Lymphoma and CLL     A cycle is every 28 days:     Lenalidomide      Rituximab-xxxx      Rituximab-xxxx   **Always confirm dose/schedule in your pharmacy ordering system**  Patient Characteristics: Follicular Lymphoma, Grades 1, 2, and 3A, Second Line, Prior Treatment with Bendamustine + Rituximab, Relapse > 24 Months Disease Type: Follicular Lymphoma, Grade 1, 2, or 3A Disease Type: Not Applicable Disease Type: Not Applicable Line of Therapy: Second Line Prior Treatment: Prior Treatment with Bendamustine + Rituximab Time to Relapse: Relapse > 24 Months Intent of Therapy: Non-Curative / Palliative Intent, Discussed with Patient

## 2023-11-28 ENCOUNTER — Other Ambulatory Visit: Payer: Self-pay

## 2023-11-29 ENCOUNTER — Ambulatory Visit: Payer: Self-pay

## 2023-11-29 NOTE — Telephone Encounter (Signed)
Message from [place] sent at 11/29/2023 11:02 AM EST  Summary: Ulcer in mouth   Patient was last seen 11/02/2023 for an ulcer on the left side of his mouth. Ulcer was improving with the medication prescribed but now its coming back requesting a stronger antibiotics.         Chief Complaint: left tongue white lesion pt stated it is very small but sore Symptoms: soreness Frequency: 2 weeks Pertinent Negatives: Patient denies fever Disposition: [] ED /[] Urgent Care (no appt availability in office) / [] Appointment(In office/virtual)/ []  [facility]/ [] Home Care/ [] Refused Recommended Disposition /[] [facility]/ [x]  Follow-up with PCP Additional Notes: refused to see anyone outside of [facility]. No appts- pt stated he has a lot of procedures coming up like colonscopy and cancer chemo.   Reason for Disposition  Mouth ulcer lasts > 2 weeks  Answer Assessment - Initial Assessment Questions 1. APPEARANCE of BLISTERS: "Describe the sores."     white 2. SIZE: "How large an area is involved with the cold sores?" (e.g., inches, cm or compare to coins)     *No Answer* 3. LOCATION: "Which part of the lip is involved?"     Tongue left side 4. ONSET: "When did the fever blisters begin?"   5. RECURRENT BLISTERS: "Have you had fever blisters before?" If Yes, ask: "When was the last time?" "How many times a year?"     N/a 6. OTHER SYMPTOMS: "Do you have any other symptoms?" (e.g., fever, sores inside mouth)     Sores in moouth  7. PREGNANCY: "Is there any chance you are pregnant?" "When was your last menstrual period?"     N/a  Answer Assessment - Initial Assessment Questions 1. LOCATION: "Where is the ulcer located?"      left side of tongue 2. NUMBER: "How many ulcers are there?"      1 3. SIZE: "How large is the ulcer?"      "Very small" 4. SEVERITY: "Are they painful?" If Yes, ask: "How bad is it?"  (Scale 1-10; or mild, moderate, severe)  - MILD - eating  and drinking  normally   - MODERATE - decreased liquid intake   - SEVERE - drinking very little      Mild  5. ONSET: "When did you first notice the ulcer?"      2 weeks ago 6. RECURRENT SYMPTOM: "Have you had a mouth ulcer before?" If Yes, ask: "When was the last time?" and "What happened that time?"      Yes in November 7. CAUSE: "What do you think is causing the mouth ulcer?"     Teeth  8. OTHER SYMPTOMS: "Do you have any other symptoms?" (e.g., fever)     no  Protocols used: Cold Sores (Fever Blisters)-A-AH, Mouth Ulcers-A-AH

## 2023-11-30 NOTE — Telephone Encounter (Signed)
Called patient he stated that he did not want to come back for an appointment and will call the office if he needs to.

## 2023-11-30 NOTE — Telephone Encounter (Signed)
Antibiotics that were given were for his upper respiratory symptoms not a mouth ulcer.  If he would like to be treated for the mouth ulcer he should return to the clinic for an appt.

## 2023-11-30 NOTE — Telephone Encounter (Signed)
Please call and schedule the patient an appointment per [name].  

## 2023-11-30 NOTE — Telephone Encounter (Signed)
 Called and left VM for patient to follow up on Patient Assistance Application. Application has not been returned with patient signatures as of yet, and we cannot send off for processing until complete. I will continue to reach out to patient. I will continue to follow and update until final determination.    Morene Potters, CPhT Oncology Pharmacy Patient Advocate  [facility]  [phone] (phone) [phone] (fax) 11/30/2023 12:51 PM

## 2023-12-02 ENCOUNTER — Encounter: Payer: Self-pay | Admitting: Oncology

## 2023-12-02 NOTE — Telephone Encounter (Signed)
 error

## 2023-12-03 DIAGNOSIS — F410 Panic disorder [episodic paroxysmal anxiety] without agoraphobia: Secondary | ICD-10-CM | POA: Diagnosis not present

## 2023-12-03 DIAGNOSIS — F1010 Alcohol abuse, uncomplicated: Secondary | ICD-10-CM | POA: Diagnosis not present

## 2023-12-03 DIAGNOSIS — F3162 Bipolar disorder, current episode mixed, moderate: Secondary | ICD-10-CM | POA: Diagnosis not present

## 2023-12-06 ENCOUNTER — Encounter: Payer: Self-pay | Admitting: Oncology

## 2023-12-06 ENCOUNTER — Other Ambulatory Visit: Payer: Self-pay | Admitting: Nurse Practitioner

## 2023-12-06 DIAGNOSIS — T7840XD Allergy, unspecified, subsequent encounter: Secondary | ICD-10-CM

## 2023-12-06 MED ORDER — PREDNISONE 50 MG PO TABS: ORAL_TABLET | ORAL | 0 refills | Status: DC

## 2023-12-06 MED ORDER — ACETAMINOPHEN 325 MG PO TABS: ORAL_TABLET | ORAL | 0 refills | Status: DC

## 2023-12-06 MED ORDER — MONTELUKAST SODIUM 10 MG PO TABS: ORAL_TABLET | ORAL | 0 refills | Status: DC

## 2023-12-06 MED ORDER — FAMOTIDINE 20 MG PO TABS: ORAL_TABLET | ORAL | 0 refills | Status: DC

## 2023-12-06 MED ORDER — DIPHENHYDRAMINE HCL 25 MG PO CAPS: ORAL_CAPSULE | ORAL | 0 refills | Status: DC

## 2023-12-06 NOTE — Progress Notes (Signed)
 Pharmacist Chemotherapy Monitoring - Initial Assessment    Anticipated start date: 12/13/23   The following has been reviewed per standard work regarding the patient's treatment regimen: The patient's diagnosis, treatment plan and drug doses, and organ/hematologic function Lab orders and baseline tests specific to treatment regimen  The treatment plan start date, drug sequencing, and pre-medications Prior authorization status  Patient's documented medication list, including drug-drug interaction screen and prescriptions for anti-emetics and supportive care specific to the treatment regimen The drug concentrations, fluid compatibility, administration routes, and timing of the medications to be used The patient's access for treatment and lifetime cumulative dose history, if applicable  The patient's medication allergies and previous infusion related reactions, if applicable   Changes made to treatment plan:  pre-medications additional pre-meds: famotidine , dexamethasone , and montelukast  f. Did you want to add those pre-meds as well?  and Split dose ruxience  (split over 2 days)  Follow up needed:  N/A   Maudie FORBES Andreas, PharmD, BCPS Clinical Pharmacist   12/06/2023  1:37 PM

## 2023-12-06 NOTE — Progress Notes (Signed)
 Treatment plan updated. Patient has not yet completed paperwork for revlimid . Spoke to Dr. Melanee who recommends pushing treatment back to allow him to start revlimid  and ruxience  at same time. Treatment plan dates updated. Home medications for patient's treatment plan sent.

## 2023-12-06 NOTE — Telephone Encounter (Signed)
 Patient returned my call and would like to meet in clinic tomorrow, 12/07/23, to sign application in person. I will submit once completed. I will continue to follow and update until final determination.    Morene Potters, CPhT Oncology Pharmacy Patient Advocate  [facility]  [phone] (phone) [phone] (fax) 12/06/2023 1:08 PM

## 2023-12-07 ENCOUNTER — Other Ambulatory Visit: Payer: Self-pay | Admitting: Internal Medicine

## 2023-12-07 ENCOUNTER — Encounter: Payer: Self-pay | Admitting: Oncology

## 2023-12-07 ENCOUNTER — Telehealth: Payer: Self-pay | Admitting: Pharmacist

## 2023-12-07 NOTE — Telephone Encounter (Signed)
 Oral Chemotherapy Pharmacist Encounter  **Conditional approval**  Successfully enrolled patient for copayment assistance funds from [employer] from the Non-Hodgkins Lymphoma fund.  Award amount: $10,000 Effective dates: 12/07/23 - Conditional approval (Income and Insurance Verification needed) ID: [id number] BIN: [id number] Group: [id number] PCN: [id number]  Billing information will be shared with [facility]. I will place a copy of the award letter to be scanned into patient's chart.  Anmarie Fukushima N. Orestes Geiman, PharmD, BCPS Hematology/Oncology Clinical Pharmacist [facility] [phone]  12/07/2023 3:20 PM

## 2023-12-08 DIAGNOSIS — R195 Other fecal abnormalities: Secondary | ICD-10-CM | POA: Diagnosis not present

## 2023-12-09 MED ORDER — LENALIDOMIDE 15 MG PO CAPS: 15.0000 mg | ORAL_CAPSULE | Freq: Every day | ORAL | 0 refills | Status: DC

## 2023-12-09 NOTE — Telephone Encounter (Signed)
 Patient now has grant to cover lenalidomide cost, rx sent to [facility].

## 2023-12-09 NOTE — Telephone Encounter (Signed)
 Oral Oncology Patient Advocate Encounter   **Funding opened while PAP application in process. PAP application cancelled and grant obtained.**  Was successful in securing patient a $4,000.00 grant from Leuk[employer] Lymphoma [employer] (LLS) to provide copayment coverage for his Lenalidomide .  This will keep the out of pocket expense at $0.    The billing information is as follows and has been shared with [facility].   Member ID: [id number] Group ID: [id number] RxBin: [id number] Dates of Eligibility: 09/09/23 through 12/07/24  Fund:  LYM ID [id number]   Morene Potters, CPhT Oncology Pharmacy Patient Advocate  [facility]  [phone] (phone) [phone] (fax) 12/09/2023 10:41 AM

## 2023-12-10 ENCOUNTER — Ambulatory Visit: Payer: Medicare HMO | Admitting: Certified Registered Nurse Anesthetist

## 2023-12-10 ENCOUNTER — Ambulatory Visit
Admission: RE | Admit: 2023-12-10 | Discharge: 2023-12-10 | Disposition: A | Discharge status: Home / Self Care | Payer: Medicare HMO | Attending: Gastroenterology | Admitting: Gastroenterology

## 2023-12-10 ENCOUNTER — Encounter
Admission: RE | Disposition: A | Discharge status: Home / Self Care | Payer: Self-pay | Source: Home / Self Care | Attending: Gastroenterology

## 2023-12-10 DIAGNOSIS — D124 Benign neoplasm of descending colon: Secondary | ICD-10-CM | POA: Diagnosis not present

## 2023-12-10 DIAGNOSIS — Z8249 Family history of ischemic heart disease and other diseases of the circulatory system: Secondary | ICD-10-CM | POA: Diagnosis not present

## 2023-12-10 DIAGNOSIS — I110 Hypertensive heart disease with heart failure: Secondary | ICD-10-CM | POA: Diagnosis not present

## 2023-12-10 DIAGNOSIS — D120 Benign neoplasm of cecum: Secondary | ICD-10-CM | POA: Insufficient documentation

## 2023-12-10 DIAGNOSIS — Z5986 Financial insecurity: Secondary | ICD-10-CM | POA: Diagnosis not present

## 2023-12-10 DIAGNOSIS — K644 Residual hemorrhoidal skin tags: Secondary | ICD-10-CM | POA: Diagnosis not present

## 2023-12-10 DIAGNOSIS — K635 Polyp of colon: Secondary | ICD-10-CM | POA: Diagnosis not present

## 2023-12-10 DIAGNOSIS — K562 Volvulus: Secondary | ICD-10-CM

## 2023-12-10 DIAGNOSIS — Z1211 Encounter for screening for malignant neoplasm of colon: Secondary | ICD-10-CM | POA: Insufficient documentation

## 2023-12-10 DIAGNOSIS — J4489 Other specified chronic obstructive pulmonary disease: Secondary | ICD-10-CM | POA: Insufficient documentation

## 2023-12-10 DIAGNOSIS — D123 Benign neoplasm of transverse colon: Secondary | ICD-10-CM | POA: Diagnosis not present

## 2023-12-10 DIAGNOSIS — Z87891 Personal history of nicotine dependence: Secondary | ICD-10-CM | POA: Insufficient documentation

## 2023-12-10 DIAGNOSIS — G4730 Sleep apnea, unspecified: Secondary | ICD-10-CM | POA: Insufficient documentation

## 2023-12-10 DIAGNOSIS — I150 Renovascular hypertension: Secondary | ICD-10-CM | POA: Insufficient documentation

## 2023-12-10 DIAGNOSIS — K219 Gastro-esophageal reflux disease without esophagitis: Secondary | ICD-10-CM | POA: Insufficient documentation

## 2023-12-10 DIAGNOSIS — R195 Other fecal abnormalities: Secondary | ICD-10-CM

## 2023-12-10 DIAGNOSIS — D122 Benign neoplasm of ascending colon: Secondary | ICD-10-CM | POA: Insufficient documentation

## 2023-12-10 DIAGNOSIS — I5032 Chronic diastolic (congestive) heart failure: Secondary | ICD-10-CM | POA: Diagnosis not present

## 2023-12-10 HISTORY — PX: SUBMUCOSAL TATTOO INJECTION: SHX6856

## 2023-12-10 HISTORY — PX: HEMOSTASIS CONTROL: SHX6838

## 2023-12-10 HISTORY — PX: SUBMUCOSAL LIFTING INJECTION: SHX6855

## 2023-12-10 HISTORY — PX: POLYPECTOMY: SHX5525

## 2023-12-10 HISTORY — PX: HEMOSTASIS CLIP PLACEMENT: SHX6857

## 2023-12-10 HISTORY — PX: COLONOSCOPY WITH PROPOFOL: SHX5780

## 2023-12-10 SURGERY — COLONOSCOPY WITH PROPOFOL
Anesthesia: General

## 2023-12-10 MED ORDER — LIDOCAINE HCL (CARDIAC) PF 100 MG/5ML IV SOSY
PREFILLED_SYRINGE | INTRAVENOUS | Status: DC | PRN
  Administered 2023-12-10: 50 mg via INTRAVENOUS

## 2023-12-10 MED ORDER — PROPOFOL 10 MG/ML IV BOLUS
INTRAVENOUS | Status: DC | PRN
Start: 2023-12-10 — End: 2023-12-10
  Administered 2023-12-10: 30 mg via INTRAVENOUS
  Administered 2023-12-10: 40 mg via INTRAVENOUS
  Administered 2023-12-10: 100 mg via INTRAVENOUS
  Administered 2023-12-10: 150 ug/kg/min via INTRAVENOUS

## 2023-12-10 MED ORDER — SODIUM CHLORIDE 0.9 % IV SOLN
INTRAVENOUS | Status: DC
Start: 2023-12-10 — End: 2023-12-10
  Administered 2023-12-10: 20 mL/h via INTRAVENOUS

## 2023-12-10 MED ORDER — PROPOFOL 1000 MG/100ML IV EMUL
INTRAVENOUS | Status: AC
  Filled 2023-12-10: qty 100

## 2023-12-10 MED ORDER — SPOT INK MARKER SYRINGE KIT
PACK | SUBMUCOSAL | Status: DC | PRN
  Administered 2023-12-10: 1 mL via SUBMUCOSAL

## 2023-12-10 MED ORDER — DEXMEDETOMIDINE HCL IN NACL 80 MCG/20ML IV SOLN
INTRAVENOUS | Status: DC | PRN
  Administered 2023-12-10: 8 ug via INTRAVENOUS
  Administered 2023-12-10: 12 ug via INTRAVENOUS

## 2023-12-10 MED ORDER — PHENYLEPHRINE 80 MCG/ML (10ML) SYRINGE FOR IV PUSH (FOR BLOOD PRESSURE SUPPORT)
PREFILLED_SYRINGE | INTRAVENOUS | Status: DC | PRN
  Administered 2023-12-10: 180 ug via INTRAVENOUS

## 2023-12-10 NOTE — Anesthesia Procedure Notes (Signed)
 Date/Time: 12/10/2023 9:39 AM  Performed by: Dominica Krabbe, CRNAPre-anesthesia Checklist: Patient identified, Emergency Drugs available, Suction available, Patient being monitored and Timeout performed Patient Re-evaluated:Patient Re-evaluated prior to induction Oxygen Delivery Method: Nasal cannula Preoxygenation: Pre-oxygenation with 100% oxygen Induction Type: IV induction

## 2023-12-10 NOTE — Transfer of Care (Signed)
 Immediate Anesthesia Transfer of Care Note  Patient: Russell Simpson  Procedure(s) Performed: COLONOSCOPY WITH PROPOFOL  POLYPECTOMY HEMOSTASIS CLIP PLACEMENT  Patient Location: Endoscopy Unit  Anesthesia Type:General  Level of Consciousness: awake, alert , and oriented  Airway & Oxygen Therapy: Patient Spontanous Breathing  Post-op Assessment: Report given to RN and Post -op Vital signs reviewed and stable  Post vital signs: Reviewed and stable  Last Vitals:  Vitals Value Taken Time  BP 85/65 12/10/23 1042  Temp    Pulse 83 12/10/23 1043  Resp 18 12/10/23 1043  SpO2 96 % 12/10/23 1043  Vitals shown include unfiled device data.  Last Pain:  Vitals:   12/10/23 1042  TempSrc:   PainSc: 0-No pain         Complications: No notable events documented.

## 2023-12-10 NOTE — H&P (Signed)
 Corinn JONELLE Brooklyn, MD [address]  [address]  [place], [place] [address]  Main: [phone]  Fax: [phone] Pager: [phone]  Primary Care Physician:  Melvin Pao, NP Primary Gastroenterologist:  Dr. Corinn JONELLE Brooklyn  Pre-Procedure History & Physical: HPI:  Russell Simpson is a 63 y.o. male is here for an colonoscopy.   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HCC)    Arthritis    Asthma    Basal cell carcinoma 01/28/2021   R upper arm, EDC 03/04/2021   Basal cell carcinoma 12/31/2022   Right temporal hair line. Nodular. 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   left upper back ED&C done   Hypertension    Hyponatremia    Inappropriate sinus tachycardia (HCC)    Non Hodgkin's lymphoma (HCC)    Pneumonia    RSV (respiratory syncytial virus infection)    SCC (squamous cell carcinoma) 08/28/2021   upper chest right of midline, EDC done 09/17/2021   SCC (squamous cell carcinoma) 09/30/2022   left chest  ED&C done   Sleep apnea    does not wear CPAP ; uses humidifier instead   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   right upper back, ED&C 06/18/2022   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   right upper back ED&C done   Squamous cell carcinoma of skin 02/19/2021   L inferior mandible, treated with EDC    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/28/2021   R ant shoulder - ED&C   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/28/2021   L upper abdomen - ED&C    Past Surgical History:  Procedure Laterality Date   ANTERIOR CERVICAL DECOMP/DISCECTOMY FUSION N/A 06/17/2016   Procedure: ANTERIOR CERVICAL DECOMPRESSION FUSION, CERVICAL 3-4, CERVICAL 4-5 WITH INSTRUMENTATION AND ALLOGRAFT;  Surgeon: Oneil Priestly, MD;  Location: [facility] OR;  Service: Orthopedics;  Laterality: N/A;  ANTERIOR CERVICAL DECOMPRESSION FUSION, CERVICAL 3-4, CERVICAL 4-5 WITH INSTRUMENTATION AND ALLOGRAFT   BACK SURGERY     x3   HARDWARE REMOVAL Right 05/04/2023   Procedure: HARDWARE REMOVAL;  Surgeon: Marchia Drivers, MD;  Location: [facility] ORS;  Service: Orthopedics;  Laterality: Right;   NECK SURGERY  12/01/2007   ORIF ANKLE FRACTURE Right 12/02/2022   Procedure: OPEN REDUCTION INTERNAL FIXATION (ORIF) ANKLE FRACTURE;  Surgeon: Marchia Drivers, MD;  Location: [facility] ORS;  Service: Orthopedics;  Laterality: Right;   ORIF ANKLE FRACTURE Right 12/01/2022   Procedure: OPEN REDUCTION INTERNAL FIXATION (ORIF) ANKLE FRACTURE;  Surgeon: Marchia Drivers, MD;  Location: [facility] ORS;  Service: Orthopedics;  Laterality: Right;   PORTA CATH INSERTION N/A 04/03/2021   Procedure: PORTA CATH INSERTION;  Surgeon: Marea Selinda RAMAN, MD;  Location: [facility];  Service: Cardiovascular;  Laterality: N/A;  VIDEO BRONCHOSCOPY WITH ENDOBRONCHIAL ULTRASOUND Bilateral 10/14/2022   Procedure: VIDEO BRONCHOSCOPY WITH ENDOBRONCHIAL ULTRASOUND;  Surgeon: Tamea Dedra CROME, MD;  Location: [facility] ORS;  Service: Pulmonary;  Laterality: Bilateral;    Prior to Admission medications   Medication Sig Start Date End Date Taking? Authorizing Provider  acetaminophen  (TYLENOL ) 325 MG tablet Take 2 tablets (650 mg) by mouth starting 2 days prior to chemo then 3 days after chemotherapy. Do not take on day of chemotherapy 12/06/23   Dasie Tinnie MATSU, NP  albuterol  (VENTOLIN  HFA) 108 (90  Base) MCG/ACT inhaler Inhale 2 puffs into the lungs every 6 (six) hours as needed for wheezing or shortness of breath. Patient not taking: Reported on 11/02/2023 01/28/23   Tamea Dedra CROME, MD  amLODipine  (NORVASC ) 2.5 MG tablet TAKE 1 TABLET BY MOUTH ONCE DAILY 10/05/23   Melvin Pao, NP  atorvastatin  (LIPITOR ) 80 MG tablet Take 1 tablet (80 mg total) by mouth daily. 11/25/23   Darliss Rogue, MD  Blood Glucose Monitoring Suppl (ONE TOUCH ULTRA 2) w/Device KIT 1 each by Does not apply route daily. 01/01/23   Melvin Pao, NP  buPROPion  (WELLBUTRIN  SR) 150 MG 12 hr tablet Take 150 mg by mouth 2 (two) times daily. 11/03/22   [provider]  cetirizine  (ZYRTEC ) 10 MG tablet Take 1 tablet (10 mg total) by mouth daily. 03/08/23   Melvin Pao, NP  clonazePAM  (KLONOPIN ) 0.5 MG tablet Take 0.5 mg by mouth every evening.    Severa Clarity, PA-C  diphenhydrAMINE  (BENADRYL ) 25 mg capsule Starting 2 days prior to your chemotherapy infusions, take 1 pill (25 mg) by mouth daily. Do not take on day of chemotherapy. 12/06/23   Dasie Tinnie MATSU, NP  DULoxetine  (CYMBALTA ) 60 MG capsule Take 60 mg by mouth every evening. 07/01/22   [provider]  famotidine  (PEPCID ) 20 MG tablet Take 1 tablet (20 mg total) by mouth 2 (two) times daily. 08/25/23   Mecum, Erin E, PA-C  famotidine  (PEPCID ) 20 MG tablet Starting 2 days prior to your chemotherapy infusions, take 1 pill (20 mg) by mouth daily. Do not take on day of chemotherapy. 12/06/23   Dasie Tinnie MATSU, NP  finasteride  (PROSCAR ) 5 MG tablet TAKE 1 TABLET BY MOUTH ONCE DAILY Patient not taking: Reported on 11/09/2023 08/18/23   Helon Kirsch A, PA-C  fluticasone  (FLONASE ) 50 MCG/ACT nasal spray Place 2 sprays into both nostrils daily. 03/08/23   Melvin Pao, NP  Fluticasone -Umeclidin-Vilant (TRELEGY ELLIPTA ) 200-62.5-25 MCG/ACT AEPB Inhale 1 puff into the lungs daily. Patient not taking: Reported on 11/09/2023 01/28/23   Tamea Dedra CROME, MD  glucose blood (ONETOUCH ULTRA) test strip 1 each by Other route daily. Use as instructed 01/01/23   Melvin Pao, NP  Lancets [facility] ULTRASOFT) lancets 1 each by Other route daily. Use as instructed 01/01/23   Melvin Pao, NP  lenalidomide  (REVLIMID ) 15 MG capsule Take 1 capsule (15 mg total) by mouth daily. Take for 21 days, then hold for 7 days. Repeat every 28 days. 12/09/23   Melanee Annah BROCKS, MD  metoprolol  succinate (TOPROL -XL) 100 MG 24 hr tablet TAKE 1 TABLET BY MOUTH ONCE DAILY WITH OR IMMEDIATELY FOLLOWING A MEAL 10/26/23   Melvin Pao, NP  mirtazapine  (REMERON ) 15 MG tablet Take 15 mg by mouth at bedtime. 06/30/22   [provider]  montelukast  (SINGULAIR ) 10 MG tablet TAKE 1 TABLET EVERY DAY 11/19/23   Melvin Pao, NP  montelukast  (SINGULAIR ) 10 MG tablet Starting 2 days prior to your  chemotherapy infusions, take 1 pill (10 mg) by mouth at night. Do not take on day of chemotherapy. 12/06/23   Dasie Tinnie MATSU, NP  naltrexone  (DEPADE) 50 MG tablet Take 50 mg by mouth daily.    [provider]  OLANZapine  (ZYPREXA ) 5 MG tablet Take 5 mg by mouth at bedtime. 01/28/22   [provider]  ondansetron  (ZOFRAN ) 4 MG tablet Take 1 tablet (4 mg total) by mouth every 8 (eight) hours as needed for nausea or vomiting. 11/02/23   Herold Hadassah SQUIBB, MD  predniSONE  (DELTASONE ) 50 MG tablet Take 1 tablet (50 mg) by mouth starting 2 days prior to chemo then 3 days after chemotherapy. Do not take on day of chemotherapy 12/06/23   Dasie Tinnie MATSU, NP  sildenafil  (VIAGRA ) 100 MG tablet Take 1 tablet (100 mg total) by mouth daily as needed for erectile dysfunction. Take two hours prior to intercourse on an empty stomach 03/26/23   McGowan, Clotilda A, PA-C  tenofovir  (VIREAD ) 300 MG tablet TAKE 1 TABLET BY MOUTH ONCE DAILY 12/31/22   Therisa Bi, MD  testosterone  cypionate (DEPOTESTOSTERONE CYPIONATE) 200 MG/ML injection Inject 1 mL (200 mg total) into the muscle every 14  (fourteen) days. INJECT 1ML INTRAMUSCULARLY EVERY 14 DAYS 10/20/23   Helon Clotilda A, PA-C    Allergies as of 11/26/2023 - Review Complete 11/26/2023  Allergen Reaction Noted   Penicillins Anaphylaxis 09/03/2015   Tetanus toxoids Swelling 09/03/2015   Lisinopril Cough 10/15/2015   Losartan  10/15/2015   Codeine Nausea Only and Nausea And Vomiting 12/05/2020   Doxycycline  Rash 01/07/2019   Ruxience  [rituximab -pvvr] Rash 05/08/2021    Family History  Problem Relation Age of Onset   Diabetes Mother    Heart attack Father    Emphysema Father    Diabetes Other     Social History   Socioeconomic History   Marital status: Divorced    Spouse name: Not on file   Number of children: Not on file   Years of education: Not on file   Highest education level: Not on file  Occupational History   Not on file  Tobacco Use   Smoking status: Former    Types: Cigars    Quit date: 11/30/2022    Years since quitting: 1.0   Smokeless tobacco: Never  Vaping Use   Vaping status: Former   Start date: 04/30/2017   Quit date: 11/30/2017  Substance and Sexual Activity   Alcohol use: Yes    Comment: beers 6 a day   Drug use: No   Sexual activity: Not Currently  Other Topics Concern   Not on file  Social History Narrative   Not on file   Social Drivers of Health   Financial Resource Strain: Medium Risk (04/05/2023)   Overall Financial Resource Strain (CARDIA)    Difficulty of Paying Living Expenses: Somewhat hard  Food Insecurity: Food Insecurity Present (05/07/2023)   [facility]   Hunger Vital Sign    Worried About Running Out of Food in the Last Year: Sometimes true    Ran Out of Food in the Last Year: Sometimes true  Transportation Needs: No Transportation Needs (04/05/2023)   PRAPARE - Administrator, Civil Service (Medical): No    Lack of Transportation (Non-Medical): No  Physical Activity: Inactive (01/01/2023)   Exercise Vital Sign     Days of Exercise per Week: 0 days    Minutes of Exercise per Session: 0  min  Stress: No Stress Concern Present (04/05/2023)   [name] of Occupational Health - Occupational Stress Questionnaire    Feeling of Stress : Only a little  Social Connections: Socially Isolated (04/05/2023)   Social Connection and Isolation Panel [NHANES]    Frequency of Communication with Friends and Family: More than three times a week    Frequency of Social Gatherings with Friends and Family: Twice a week    Attends Religious Services: Never    Database Administrator or Organizations: No    Attends Banker Meetings: Never    Marital Status: Divorced  Catering Manager Violence: Not At Risk (04/05/2023)   Humiliation, Afraid, Rape, and Kick questionnaire    Fear of Current or Ex-Partner: No    Emotionally Abused: No    Physically Abused: No    Sexually Abused: No    Review of Systems: See HPI, otherwise negative ROS  Physical Exam: BP 116/75   Pulse 89   Temp (!) 96.5 F (35.8 C) (Temporal)   Resp 20   Ht 6' 2 (1.88 m)   Wt 106.9 kg   SpO2 97%   BMI 30.25 kg/m  General:   Alert,  pleasant and cooperative in NAD Head:  Normocephalic and atraumatic. Neck:  Supple; no masses or thyromegaly. Lungs:  Clear throughout to auscultation.    Heart:  Regular rate and rhythm. Abdomen:  Soft, nontender and nondistended. Normal bowel sounds, without guarding, and without rebound.   Neurologic:  Alert and  oriented x4;  grossly normal neurologically.  Impression/Plan: Russell Simpson is here for an colonoscopy to be performed for cologuard positive  Risks, benefits, limitations, and alternatives regarding  colonoscopy have been reviewed with the patient.  Questions have been answered.  All parties agreeable.   Corinn Brooklyn, MD  12/10/2023, 9:34 AM

## 2023-12-10 NOTE — Anesthesia Postprocedure Evaluation (Signed)
 Anesthesia Post Note  Patient: Russell Simpson  Procedure(s) Performed: COLONOSCOPY WITH PROPOFOL  POLYPECTOMY HEMOSTASIS CLIP PLACEMENT SUBMUCOSAL TATTOO INJECTION SUBMUCOSAL LIFTING INJECTION HEMOSTASIS CONTROL  Patient location during evaluation: Endoscopy Anesthesia Type: General Level of consciousness: awake and alert Pain management: pain level controlled Vital Signs Assessment: post-procedure vital signs reviewed and stable Respiratory status: spontaneous breathing, nonlabored ventilation, respiratory function stable and patient connected to nasal cannula oxygen Cardiovascular status: blood pressure returned to baseline and stable Postop Assessment: no apparent nausea or vomiting Anesthetic complications: no   No notable events documented.   Last Vitals:  Vitals:   12/10/23 1052 12/10/23 1102  BP: 110/76 117/63  Pulse: 86 84  Resp: 18 15  Temp:    SpO2: 96% 96%    Last Pain:  Vitals:   12/10/23 1102  TempSrc:   PainSc: 0-No pain                 [name]

## 2023-12-10 NOTE — Anesthesia Preprocedure Evaluation (Signed)
 Anesthesia Evaluation  Patient identified by MRN, date of birth, ID band Patient awake    Reviewed: Allergy & Precautions, NPO status , Patient's Chart, lab work & pertinent test results  History of Anesthesia Complications Negative for: history of anesthetic complications  Airway Mallampati: III  TM Distance: <3 FB Neck ROM: full    Dental  (+) Chipped   Pulmonary shortness of breath and with exertion, asthma , sleep apnea , COPD, Patient abstained from smoking., former smoker   Pulmonary exam normal        Cardiovascular Exercise Tolerance: Good hypertension, +CHF  Normal cardiovascular exam     Neuro/Psych  PSYCHIATRIC DISORDERS      negative neurological ROS     GI/Hepatic Neg liver ROS,GERD  Controlled,,  Endo/Other  Hypothyroidism    Renal/GU negative Renal ROS  negative genitourinary   Musculoskeletal   Abdominal   Peds  Hematology negative hematology ROS (+)   Anesthesia Other Findings Past Medical History: No date: 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HCC) No date: Arthritis No date: Asthma 01/28/2021: Basal cell carcinoma     Comment:  R upper arm, [facility] 03/04/2021 12/31/2022: Basal cell carcinoma     Comment:  Right temporal hair line. Nodular. 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) No date: GERD (gastroesophageal reflux disease) No date: History of methicillin resistant staphylococcus aureus (MRSA)     Comment:  years ago 05/29/2021: History of SCC (squamous cell carcinoma) of skin     Comment:  left neck, Moh's 05/29/21 No date: History of SCC  (squamous cell carcinoma) of skin 09/30/2022: History of squamous cell carcinoma in situ (SCCIS)     Comment:  left upper back ED&C done No date: Hypertension No date: Hyponatremia No date: Inappropriate sinus tachycardia (HCC) No date: Non Hodgkin's lymphoma (HCC) No date: Pneumonia No date: RSV (respiratory syncytial virus infection) 08/28/2021: SCC (squamous cell carcinoma)     Comment:  upper chest right of midline, EDC done 09/17/2021 09/30/2022: SCC (squamous cell carcinoma)     Comment:  left chest  ED&C done No date: Sleep apnea     Comment:  does not wear CPAP ; uses humidifier instead 05/27/2022: Squamous cell carcinoma in situ (SCCIS)     Comment:  right upper back, ED&C 06/18/2022 09/30/2022: Squamous cell carcinoma in situ (SCCIS)     Comment:  right upper back ED&C done 02/19/2021: Squamous cell carcinoma of skin     Comment:  L inferior mandible, treated with [facility] 08/28/2021: Squamous cell carcinoma of skin     Comment:  R ant shoulder - ED&C 08/28/2021: Squamous cell carcinoma of skin     Comment:  L upper abdomen - ED&C  Past Surgical History: 06/17/2016: ANTERIOR CERVICAL DECOMP/DISCECTOMY FUSION; N/A     Comment:  Procedure: ANTERIOR CERVICAL DECOMPRESSION FUSION,               CERVICAL 3-4, CERVICAL 4-5 WITH INSTRUMENTATION AND               ALLOGRAFT;  Surgeon: Oneil Priestly, MD;  Location: [facility] OR;              Service:  Orthopedics;  Laterality: N/A;  ANTERIOR               CERVICAL DECOMPRESSION FUSION, CERVICAL 3-4, CERVICAL 4-5              WITH INSTRUMENTATION AND ALLOGRAFT No date: BACK SURGERY     Comment:  x3 05/04/2023: HARDWARE REMOVAL; Right     Comment:  Procedure: HARDWARE REMOVAL;  Surgeon: Marchia Drivers,              MD;  Location: [facility] ORS;  Service: Orthopedics;                Laterality: Right; 12/01/2007: NECK SURGERY 12/02/2022: ORIF ANKLE FRACTURE; Right     Comment:  Procedure: OPEN REDUCTION INTERNAL FIXATION (ORIF) ANKLE               FRACTURE;  Surgeon: Marchia Drivers, MD;  Location: [facility]              ORS;  Service: Orthopedics;  Laterality: Right; 12/01/2022: ORIF ANKLE FRACTURE; Right     Comment:  Procedure: OPEN REDUCTION INTERNAL FIXATION (ORIF) ANKLE              FRACTURE;  Surgeon: Marchia Drivers, MD;  Location: [facility]              ORS;  Service: Orthopedics;  Laterality: Right; 04/03/2021: PORTA CATH INSERTION; N/A     Comment:  Procedure: PORTA CATH INSERTION;  Surgeon: Marea Selinda RAMAN,              MD;  Location: [facility] INVASIVE CV LAB;  Service:               Cardiovascular;  Laterality: N/A; 10/14/2022: VIDEO BRONCHOSCOPY WITH ENDOBRONCHIAL ULTRASOUND;  Bilateral     Comment:  Procedure: VIDEO BRONCHOSCOPY WITH ENDOBRONCHIAL               ULTRASOUND;  Surgeon: Tamea Dedra CROME, MD;  Location:               [facility] ORS;  Service: Pulmonary;  Laterality: Bilateral;     Reproductive/Obstetrics negative OB ROS                             Anesthesia Physical Anesthesia Plan  ASA: 3  Anesthesia Plan: General   Post-op Pain Management:    Induction: Intravenous  PONV Risk Score and Plan: Propofol  infusion and TIVA  Airway Management Planned: Natural Airway and Nasal Cannula  Additional Equipment:   Intra-op Plan:   Post-operative Plan:   Informed Consent: I have reviewed the patients History and Physical, chart, labs and discussed the procedure including the risks, benefits and alternatives for the proposed anesthesia with the patient or authorized representative who has indicated his/her understanding and acceptance.     Dental Advisory Given  Plan Discussed with: Anesthesiologist, CRNA and Surgeon  Anesthesia Plan Comments: (Patient consented for risks of anesthesia including but not limited to:  - adverse reactions to medications - risk of airway placement if required - damage to eyes, teeth, lips or other oral mucosa - nerve damage due to positioning  - sore throat  or hoarseness - Damage to heart, brain, nerves, lungs, other parts of body or loss of life  Patient voiced understanding and assent.)       Anesthesia Quick Evaluation

## 2023-12-10 NOTE — Op Note (Signed)
 [facility] Patient Name: Russell Simpson Procedure Date: 12/10/2023 9:36 AM MRN: [id number] Account #: [id number] Date of Birth: 13-Sep-1961 Admit Type: Outpatient Age: 63 Room: [facility] ENDO ROOM 2 Gender: Male Note Status: Finalized Instrument Name: Colonoscope [id number] Procedure:             Colonoscopy Indications:           Last colonoscopy: February 2014, Last colonoscopy 10                         years ago, Positive Cologuard test Providers:             Corinn Jess Brooklyn MD, MD Referring MD:          Darice Petty (Referring MD) Medicines:             General Anesthesia Complications:         No immediate complications. Estimated blood loss: None. Procedure:             Pre-Anesthesia Assessment:                        - Prior to the procedure, a History and Physical was                         performed, and patient medications and allergies were                         reviewed. The patient is competent. The risks and                         benefits of the procedure and the sedation options and                         risks were discussed with the patient. All questions                         were answered and informed consent was obtained.                         Patient identification and proposed procedure were                         verified by the physician, the nurse, the                         anesthesiologist, the anesthetist and the technician                         in the pre-procedure area in the procedure room in the                         endoscopy suite. Mental Status Examination: alert and                         oriented. Airway Examination: normal oropharyngeal                         airway and neck mobility. Respiratory Examination:  clear to auscultation. CV Examination: normal.                         Prophylactic Antibiotics: The patient does not require                         prophylactic  antibiotics. Prior Anticoagulants: The                         patient has taken no anticoagulant or antiplatelet                         agents. ASA Grade Assessment: III - A patient with                         severe systemic disease. After reviewing the risks and                         benefits, the patient was deemed in satisfactory                         condition to undergo the procedure. The anesthesia                         plan was to use general anesthesia. Immediately prior                         to administration of medications, the patient was                         re-assessed for adequacy to receive sedatives. The                         heart rate, respiratory rate, oxygen saturations,                         blood pressure, adequacy of pulmonary ventilation, and                         response to care were monitored throughout the                         procedure. The physical status of the patient was                         re-assessed after the procedure.                        After obtaining informed consent, the colonoscope was                         passed under direct vision. Throughout the procedure,                         the patient's blood pressure, pulse, and oxygen                         saturations were monitored continuously. The  Colonoscope was introduced through the anus and                         advanced to the the cecum, identified by appendiceal                         orifice and ileocecal valve. The colonoscopy was                         performed with moderate difficulty due to significant                         looping and the patient's body habitus. Successful                         completion of the procedure was aided by applying                         abdominal pressure. The patient tolerated the                         procedure well. The quality of the bowel preparation                         was  evaluated using the BBPS [facility] Bowel Preparation                         Scale) with scores of: Right Colon = 3, Transverse                         Colon = 3 and Left Colon = 3 (entire mucosa seen well                         with no residual staining, small fragments of stool or                         opaque liquid). The total BBPS score equals 9. The                         ileocecal valve, appendiceal orifice, and rectum were                         photographed. Findings:      The perianal and digital rectal examinations were normal. Pertinent       negatives include normal sphincter tone and no palpable rectal lesions.      A diminutive polyp was found in the cecum. The polyp was sessile. The       polyp was removed with a jumbo cold forceps. Resection and retrieval       were complete. Estimated blood loss: none.      Two sessile polyps were found in the ascending colon. The polyps were 4       to 5 mm in size. These polyps were removed with a cold snare. Resection       and retrieval were complete. Estimated blood loss: none.      A 4 mm polyp was found in the transverse colon. The polyp was sessile.  The polyp was removed with a cold snare. Resection and retrieval were       complete. Estimated blood loss: none.      A 25 mm polyp was found in the descending colon. The polyp was flat.       Preparations were made for mucosal resection. Demarcation of the lesion       was performed with narrow band imaging to clearly identify the       boundaries of the lesion. Eleview was injected to raise the lesion.       Snare mucosal resection was performed. Resection and retrieval were       complete. Resected tissue including tissue margins will be examined by       histology. To prevent bleeding after mucosal resection, four hemostatic       clips were successfully placed (MR safe). Clip manufacturer: [employer]. There was no bleeding during, or at the end, of the        procedure. To stop bleeding after the mucosal resection, hemostatic gel       was deployed. There was no bleeding during, or at the end, of the       procedure.      Two sessile polyps were found in the descending colon. The polyps were 4       to 5 mm in size. These polyps were removed with a cold snare. Resection       and retrieval were complete.      Non-bleeding external hemorrhoids were found during retroflexion. The       hemorrhoids were medium-sized. Impression:            - One diminutive polyp in the cecum, removed with a                         jumbo cold forceps. Resected and retrieved.                        - Two 4 to 5 mm polyps in the ascending colon, removed                         with a cold snare. Resected and retrieved.                        - One 4 mm polyp in the transverse colon, removed with                         a cold snare. Resected and retrieved.                        - One 25 mm polyp in the descending colon, removed                         with mucosal resection. Resected and retrieved. Clip                         manufacturer: [employer]. Clips (MR safe) were                         placed. hemostatic spray applied.                        -  Two 4 to 5 mm polyps in the descending colon,                         removed with a cold snare. Resected and retrieved.                        - Non-bleeding external hemorrhoids.                        - Mucosal resection was performed. Resection and                         retrieval were complete. Recommendation:        - Discharge patient to home (with escort).                        - Resume previous diet today.                        - Continue present medications.                        - Await pathology results.                        - Repeat colonoscopy in 6 months for surveillance                         after piecemeal polypectomy. Procedure Code(s):     --- Professional ---                         [phone], Colonoscopy, flexible; with endoscopic mucosal                         resection                        45385, 59, Colonoscopy, flexible; with removal of                         tumor(s), polyp(s), or other lesion(s) by snare                         technique                        45380, 59, Colonoscopy, flexible; with biopsy, single                         or multiple Diagnosis Code(s):     --- Professional ---                        D12.0, Benign neoplasm of cecum                        D12.3, Benign neoplasm of transverse colon (hepatic                         flexure or splenic flexure)  D12.4, Benign neoplasm of descending colon                        D12.2, Benign neoplasm of ascending colon                        K64.4, Residual hemorrhoidal skin tags                        R19.5, Other fecal abnormalities CPT copyright 2022 American Medical Association. All rights reserved. The codes documented in this report are preliminary and upon coder review may  be revised to meet current compliance requirements. Dr. Angelita Brooklyn Corinn Jess Brooklyn MD, MD 12/10/2023 10:37:57 AM This report has been signed electronically. Number of Addenda: 0 Note Initiated On: 12/10/2023 9:36 AM Scope Withdrawal Time: 0 hours 42 minutes 5 seconds  Total Procedure Duration: 0 hours 46 minutes 45 seconds  Estimated Blood Loss:  Estimated blood loss: none.      [facility]

## 2023-12-12 ENCOUNTER — Encounter: Payer: Self-pay | Admitting: Oncology

## 2023-12-13 ENCOUNTER — Ambulatory Visit: Payer: Medicare HMO | Admitting: Oncology

## 2023-12-13 ENCOUNTER — Ambulatory Visit: Payer: Medicare HMO | Admitting: Pharmacist

## 2023-12-13 ENCOUNTER — Ambulatory Visit: Payer: Medicare HMO

## 2023-12-13 ENCOUNTER — Other Ambulatory Visit: Payer: Medicare HMO

## 2023-12-13 ENCOUNTER — Encounter: Payer: Self-pay | Admitting: Gastroenterology

## 2023-12-13 LAB — SURGICAL PATHOLOGY

## 2023-12-13 NOTE — Telephone Encounter (Addendum)
 Patient returned my call. Patient knows script and grant information were sent to [facility] for processing and fulfillment. Patient indicated he had not heard from [facility] as of yet to schedule first fill and delivery. I provided patient with [facility]'s phone number and patient indicated they would call and schedule delivery as soon as our phone conversation ended. Patient will also call me back to let me know what day he has scheduled delivery of Revlimid .   Patient called me back and informed me he initiated the fill of Revlimid  at [facility] and that is was in processing now. Patient stated he would receive an email once it had been shipped out to him.    Morene Potters, CPhT Oncology Pharmacy Patient Advocate  [facility]  [phone] (phone) [phone] (fax) 12/13/2023 11:22 AM

## 2023-12-14 ENCOUNTER — Ambulatory Visit: Payer: Medicare HMO

## 2023-12-14 ENCOUNTER — Other Ambulatory Visit: Payer: Self-pay

## 2023-12-14 ENCOUNTER — Telehealth: Payer: Self-pay

## 2023-12-14 ENCOUNTER — Encounter: Payer: Self-pay | Admitting: Gastroenterology

## 2023-12-14 MED ORDER — ATORVASTATIN CALCIUM 80 MG PO TABS: 80.0000 mg | ORAL_TABLET | Freq: Every day | ORAL | 0 refills | Status: DC

## 2023-12-14 NOTE — Telephone Encounter (Signed)
 Put a reminder for 6 months and letter was sent to patient

## 2023-12-14 NOTE — Telephone Encounter (Signed)
 Requested Prescriptions   Signed Prescriptions Disp Refills   atorvastatin (LIPITOR) 80 MG tablet 15 tablet 0    Sig: Take 1 tablet (80 mg total) by mouth daily.    Authorizing Provider: Debbe Odea    Ordering User: Guerry Minors

## 2023-12-14 NOTE — Telephone Encounter (Signed)
 last visit: 09/11/22 with plan to Follow-up in prn   Next visit: none/no Active recall  3rd attempt 15 day supply rx sent requesting pt call for appt or request from pcp.

## 2023-12-14 NOTE — Telephone Encounter (Signed)
-----   Message from [facility] sent at 12/14/2023 11:21 AM EST ----- Needs colonoscopy in 6 months due to piecemeal polypectomy  RV

## 2023-12-15 ENCOUNTER — Ambulatory Visit: Payer: Medicare HMO

## 2023-12-15 NOTE — Telephone Encounter (Signed)
 Per patient/caregiver, medication will be delivered on 12/17/23. He knows not to start until after his office on 12/27/23.

## 2023-12-17 ENCOUNTER — Telehealth: Payer: Self-pay | Admitting: *Deleted

## 2023-12-17 DIAGNOSIS — R0981 Nasal congestion: Secondary | ICD-10-CM

## 2023-12-17 MED ORDER — ONDANSETRON HCL 4 MG PO TABS: 4.0000 mg | ORAL_TABLET | Freq: Three times a day (TID) | ORAL | 1 refills | Status: DC | PRN

## 2023-12-17 NOTE — Telephone Encounter (Signed)
Family called and wanted refill of ondansetron, Dr. Smith Trek ok with it and sent to [facility]. Called back and spoke to pt. And he know he can go to rx for the ondansetron and he said the revlimid will come today

## 2023-12-19 ENCOUNTER — Other Ambulatory Visit: Payer: Self-pay | Admitting: *Deleted

## 2023-12-20 ENCOUNTER — Encounter: Payer: Self-pay | Admitting: Oncology

## 2023-12-20 ENCOUNTER — Ambulatory Visit: Payer: Medicare HMO

## 2023-12-20 ENCOUNTER — Other Ambulatory Visit: Payer: Medicare HMO

## 2023-12-20 ENCOUNTER — Ambulatory Visit: Payer: Medicare HMO | Admitting: Oncology

## 2023-12-21 ENCOUNTER — Ambulatory Visit: Payer: Medicare HMO

## 2023-12-21 ENCOUNTER — Other Ambulatory Visit: Payer: Self-pay

## 2023-12-22 ENCOUNTER — Ambulatory Visit: Payer: Medicare HMO

## 2023-12-22 NOTE — Telephone Encounter (Signed)
Please close encounter

## 2023-12-24 ENCOUNTER — Encounter: Payer: Self-pay | Admitting: Nurse Practitioner

## 2023-12-24 ENCOUNTER — Ambulatory Visit (INDEPENDENT_AMBULATORY_CARE_PROVIDER_SITE_OTHER): Payer: Medicare HMO | Admitting: Nurse Practitioner

## 2023-12-24 VITALS — BP 119/75 | HR 96 | Temp 97.6°F | Ht 74.0 in | Wt 240.6 lb

## 2023-12-24 DIAGNOSIS — J321 Chronic frontal sinusitis: Secondary | ICD-10-CM | POA: Insufficient documentation

## 2023-12-24 DIAGNOSIS — J0110 Acute frontal sinusitis, unspecified: Secondary | ICD-10-CM | POA: Diagnosis not present

## 2023-12-24 MED ORDER — AZITHROMYCIN 250 MG PO TABS: ORAL_TABLET | ORAL | 0 refills | Status: AC

## 2023-12-24 NOTE — Patient Instructions (Signed)

## 2023-12-24 NOTE — Progress Notes (Signed)
BP 119/75   Pulse 96   Temp 97.6 F (36.4 C) (Oral)   Ht 6\' 2"  (1.88 m)   Wt 240 lb 9.6 oz (109.1 kg)   SpO2 98%   BMI 30.89 kg/m    Subjective:    Patient ID: Ethel Veronica, male    DOB: 1961/10/29, 63 y.o.   MRN: [id number]  HPI: Joneric Streight is a 63 y.o. male  Chief Complaint  Patient presents with   URI    Patient states he has been having congestion, sinus pressure, runny nose, and a cough that started this past Saturday.    UPPER RESPIRATORY TRACT INFECTION Started on Saturday with symptoms.  Waking up with crust in eyes, using eye drops which are helping.    Starts chemotherapy on Monday, this is his second time.   Fever: no Cough: yes Shortness of breath: on occasion due to lack of cardio exercise Wheezing: no Chest pain: no Chest tightness: no Chest congestion: no Nasal congestion: yes Runny nose: yes Post nasal drip: yes Sneezing: no Sore throat: no Swollen glands: no Sinus pressure: yes Headache: yes Face pain: no Toothache: no Ear pain: none Ear pressure: none Eyes red/itching:no Eye drainage/crusting: yes  Vomiting: no Rash: no Fatigue: yes Sick contacts: no Strep contacts: no  Context: fluctuating Recurrent sinusitis: no Relief with OTC cold/cough medications: no  Treatments attempted: Mucinex   Relevant past medical, surgical, family and social history reviewed and updated as indicated. Interim medical history since our last visit reviewed. Allergies and medications reviewed and updated.  Review of Systems  Constitutional:  Positive for fatigue. Negative for activity change, appetite change, chills, diaphoresis and fever.  HENT:  Positive for congestion, postnasal drip, rhinorrhea, sinus pressure and sinus pain. Negative for ear discharge, ear pain, sneezing, sore throat and voice change.   Respiratory:  Positive for cough. Negative for chest tightness, shortness of breath and wheezing.   Cardiovascular:  Negative for chest pain,  palpitations and leg swelling.  Gastrointestinal: Negative.   Neurological: Negative.   Psychiatric/Behavioral: Negative.     Per HPI unless specifically indicated above     Objective:    BP 119/75   Pulse 96   Temp 97.6 F (36.4 C) (Oral)   Ht 6\' 2"  (1.88 m)   Wt 240 lb 9.6 oz (109.1 kg)   SpO2 98%   BMI 30.89 kg/m   Wt Readings from Last 3 Encounters:  12/24/23 240 lb 9.6 oz (109.1 kg)  12/10/23 235 lb 9.6 oz (106.9 kg)  11/26/23 244 lb 8 oz (110.9 kg)    Physical Exam Vitals and nursing note reviewed.  Constitutional:      General: He is awake. He is not in acute distress.    Appearance: He is well-developed and well-groomed. He is obese. He is not ill-appearing or toxic-appearing.  HENT:     Head: Normocephalic.     Right Ear: Hearing, ear canal and external ear normal. No tenderness. A middle ear effusion is present. Tympanic membrane is not injected or perforated.     Left Ear: Hearing, ear canal and external ear normal. No tenderness. A middle ear effusion is present. Tympanic membrane is not injected or perforated.     Nose: Rhinorrhea present. Rhinorrhea is clear.     Right Sinus: No maxillary sinus tenderness or frontal sinus tenderness.     Left Sinus: No maxillary sinus tenderness or frontal sinus tenderness.     Mouth/Throat:     Mouth: Mucous membranes  are moist.     Pharynx: Posterior oropharyngeal erythema present. No pharyngeal swelling or oropharyngeal exudate.  Eyes:     General: Lids are normal.     Extraocular Movements: Extraocular movements intact.     Conjunctiva/sclera: Conjunctivae normal.  Neck:     Thyroid: No thyromegaly.     Vascular: No carotid bruit.  Cardiovascular:     Rate and Rhythm: Normal rate and regular rhythm.     Heart sounds: Normal heart sounds.  Pulmonary:     Effort: No accessory muscle usage or respiratory distress.     Breath sounds: Normal breath sounds. No decreased breath sounds, wheezing or rhonchi.  Abdominal:      General: Bowel sounds are normal. There is no distension.     Palpations: Abdomen is soft.     Tenderness: There is no abdominal tenderness.  Musculoskeletal:     Cervical back: Full passive range of motion without pain.     Right lower leg: No edema.     Left lower leg: No edema.  Lymphadenopathy:     Cervical: No cervical adenopathy.  Skin:    General: Skin is warm.     Capillary Refill: Capillary refill takes less than 2 seconds.  Neurological:     Mental Status: He is alert and oriented to person, place, and time.     Deep Tendon Reflexes: Reflexes are normal and symmetric.     Reflex Scores:      Brachioradialis reflexes are 2+ on the right side and 2+ on the left side.      Patellar reflexes are 2+ on the right side and 2+ on the left side. Psychiatric:        Attention and Perception: Attention normal.        Mood and Affect: Mood normal.        Speech: Speech normal.        Behavior: Behavior normal. Behavior is cooperative.        Thought Content: Thought content normal.     Results for orders placed or performed during the hospital encounter of 12/10/23  Surgical pathology   Collection Time: 12/10/23 12:00 AM  Result Value Ref Range   SURGICAL PATHOLOGY      SURGICAL PATHOLOGY [facility] [address], [address] [place], [place] [address] Telephone [phone] or [phone] Fax [phone]  REPORT OF SURGICAL PATHOLOGY   Accession #: [id number] Patient Name: ASHISH, ROSSETTI Visit # : [id number]  MRN: [id number] Physician: Lannette Donath DOB/Age 08/09/1961 (Age: 68) Gender: M Collected Date: 12/10/2023 Received Date: 12/10/2023  FINAL DIAGNOSIS       1. Cecum Polyp, cbx :       - TUBULAR ADENOMA (1 FRAGMENT)      - COLONIC MUCOSA WITH FOCAL HYPERPLASTIC CHANGES (1 FRAGMENT)      - NEGATIVE FOR HIGH-GRADE DYSPLASIA OR MALIGNANCY       2. Ascending  Colon Polyp, x2 cold snare :       - TUBULAR ADENOMA(S) (5 FRAGMENTS)      -  COLONIC MUCOSA WITH FOCAL SURFACE HYPERPLASTIC CHANGES AND LYMPHOID AGGREGATE      (MULTIPLE FRAGMENTS)      - NEGATIVE FOR HIGH-GRADE DYSPLASIA OR MALIGNANCY       3. Transverse Colon Polyp, cold snare :       - TUBULAR ADENOMA (MULTIPLE FRAGMENTS)      - NEGATIVE F OR HIGH-GRADE DYSPLASIA OR MALIGNANCY  4. Descending Colon Polyp, x1 hot snare, x2 cold snare :       - TUBULAR ADENOMA(S) (MULTIPLE FRAGMENTS)      - NEGATIVE FOR HIGH-GRADE DYSPLASIA OR MALIGNANCY       ELECTRONIC SIGNATURE : Kanteti M.D., Dossie Arbour., Pathologist, Electronic Signature  MICROSCOPIC DESCRIPTION  CASE COMMENTS STAINS USED IN DIAGNOSIS: H&E H&E H&E H&E H&E    CLINICAL HISTORY  SPECIMEN(S) OBTAINED 1. Cecum Polyp, Cbx 2. Ascending  Colon Polyp, X2 Cold Snare 3. Transverse Colon Polyp, Cold Snare 4. Descending Colon Polyp, X1 Hot Snare, X2 Cold Snare  SPECIMEN COMMENTS: SPECIMEN CLINICAL INFORMATION: 1. Positive cologuard, screening colonoscopy, colon polyps    Gross Description 1. "CBX polyp cecum".Received in formalin is a 0.5 x 0.3 x 0.2 cm aggregate of two tan polypoid tissue fragments submitted entirely in block 1A. 2. "Cold snare polyp x 2 ascending colon".Received in formalin is a 1.0 x 0.6 x 0.2 cm aggregate of multip le soft, tan tissue fragments submitted entirely in block 2A. 3. "Cold snare polyp transverse colon".Received in formalin is a 0.8 x 0.8 x 0.2 cm aggregate of multiple tan polypoid tissue fragments submitted entirely in block 3A. 4. "Hot snare polyp x 1/cold snare polyp x 2 descending colon".Received in formalin is a 2.0 x 2.0 x 0.4 cm aggregate of multiple tan polypoid tissue fragments submitted entirely in blocks 4A-4B.(SB:kh 12/10/23)        Report signed out from the following location(s) [place]. [facility] [address], [place] [address] CLIA #: [id number]  [facility] [address] [place], [place]  [address] CLIA #: [id number]    *Note: Due to a large number of results and/or encounters for the requested time period, some results have not been displayed. A complete set of results can be found in Results Review.      Assessment & Plan:   Problem List Items Addressed This Visit       Respiratory   Frontal sinusitis - Primary   For 7 days. Starts chemo on Monday.  Will start a Zpack and recommend he continue OTC medications for symptom relief.  Is on Naltrexone, will avoid Tussionex at this time due to Naltrexone.  Made patient aware via MyChart.  Recommend: - Increased rest - Increasing Fluids - Acetaminophen as needed for fever/pain.  - Salt water gargling, chloraseptic spray and throat lozenges - Mucinex.  - Humidifying the air.       Relevant Medications   azithromycin (ZITHROMAX) 250 MG tablet     Follow up plan: Return if symptoms worsen or fail to improve.

## 2023-12-24 NOTE — Assessment & Plan Note (Addendum)
For 7 days. Starts chemo on Monday.  Will start a Zpack and recommend he continue OTC medications for symptom relief.  Is on Naltrexone, will avoid Tussionex at this time due to Naltrexone.  Made patient aware via MyChart.  Recommend: - Increased rest - Increasing Fluids - Acetaminophen as needed for fever/pain.  - Salt water gargling, chloraseptic spray and throat lozenges - Mucinex.  - Humidifying the air.

## 2023-12-27 ENCOUNTER — Encounter: Payer: Self-pay | Admitting: Oncology

## 2023-12-27 ENCOUNTER — Inpatient Hospital Stay: Payer: Medicare HMO

## 2023-12-27 ENCOUNTER — Other Ambulatory Visit: Payer: Self-pay | Admitting: Nurse Practitioner

## 2023-12-27 ENCOUNTER — Other Ambulatory Visit: Payer: Medicare HMO

## 2023-12-27 ENCOUNTER — Inpatient Hospital Stay: Payer: Medicare HMO | Admitting: Pharmacist

## 2023-12-27 ENCOUNTER — Ambulatory Visit: Payer: Medicare HMO | Admitting: Oncology

## 2023-12-27 ENCOUNTER — Ambulatory Visit: Payer: Medicare HMO

## 2023-12-27 ENCOUNTER — Inpatient Hospital Stay: Payer: Medicare HMO | Attending: Oncology

## 2023-12-27 ENCOUNTER — Inpatient Hospital Stay (HOSPITAL_BASED_OUTPATIENT_CLINIC_OR_DEPARTMENT_OTHER): Payer: Medicare HMO | Admitting: Oncology

## 2023-12-27 VITALS — BP 123/81 | HR 98 | Temp 98.2°F | Resp 18

## 2023-12-27 VITALS — BP 120/71 | HR 87 | Temp 96.9°F | Resp 17 | Wt 241.0 lb

## 2023-12-27 DIAGNOSIS — C8202 Follicular lymphoma grade I, intrathoracic lymph nodes: Secondary | ICD-10-CM

## 2023-12-27 DIAGNOSIS — Z5112 Encounter for antineoplastic immunotherapy: Secondary | ICD-10-CM | POA: Insufficient documentation

## 2023-12-27 DIAGNOSIS — R232 Flushing: Secondary | ICD-10-CM | POA: Diagnosis not present

## 2023-12-27 DIAGNOSIS — Z79620 Long term (current) use of immunosuppressive biologic: Secondary | ICD-10-CM | POA: Diagnosis not present

## 2023-12-27 DIAGNOSIS — R768 Other specified abnormal immunological findings in serum: Secondary | ICD-10-CM

## 2023-12-27 DIAGNOSIS — Z5181 Encounter for therapeutic drug level monitoring: Secondary | ICD-10-CM

## 2023-12-27 DIAGNOSIS — T451X5A Adverse effect of antineoplastic and immunosuppressive drugs, initial encounter: Secondary | ICD-10-CM | POA: Insufficient documentation

## 2023-12-27 DIAGNOSIS — Z79899 Other long term (current) drug therapy: Secondary | ICD-10-CM | POA: Diagnosis not present

## 2023-12-27 LAB — CBC WITH DIFFERENTIAL (CANCER CENTER ONLY)
Abs Immature Granulocytes: 0.82 10*3/uL — ABNORMAL HIGH (ref 0.00–0.07)
Basophils Absolute: 0 10*3/uL (ref 0.0–0.1)
Basophils Relative: 0 %
Eosinophils Absolute: 0 10*3/uL (ref 0.0–0.5)
Eosinophils Relative: 0 %
HCT: 33.9 % — ABNORMAL LOW (ref 39.0–52.0)
Hemoglobin: 11.7 g/dL — ABNORMAL LOW (ref 13.0–17.0)
Immature Granulocytes: 7 %
Lymphocytes Relative: 50 %
Lymphs Abs: 5.9 10*3/uL — ABNORMAL HIGH (ref 0.7–4.0)
MCH: 31.4 pg (ref 26.0–34.0)
MCHC: 34.5 g/dL (ref 30.0–36.0)
MCV: 90.9 fL (ref 80.0–100.0)
Monocytes Absolute: 0.9 10*3/uL (ref 0.1–1.0)
Monocytes Relative: 7 %
Neutro Abs: 4.3 10*3/uL (ref 1.7–7.7)
Neutrophils Relative %: 36 %
Platelet Count: 146 10*3/uL — ABNORMAL LOW (ref 150–400)
RBC: 3.73 MIL/uL — ABNORMAL LOW (ref 4.22–5.81)
RDW: 12.3 % (ref 11.5–15.5)
Smear Review: NORMAL
WBC Count: 11.9 10*3/uL — ABNORMAL HIGH (ref 4.0–10.5)
nRBC: 0 % (ref 0.0–0.2)

## 2023-12-27 LAB — CMP (CANCER CENTER ONLY)
ALT: 24 U/L (ref 0–44)
AST: 31 U/L (ref 15–41)
Albumin: 3.9 g/dL (ref 3.5–5.0)
Alkaline Phosphatase: 135 U/L — ABNORMAL HIGH (ref 38–126)
Anion gap: 10 (ref 5–15)
BUN: 15 mg/dL (ref 8–23)
CO2: 24 mmol/L (ref 22–32)
Calcium: 8.6 mg/dL — ABNORMAL LOW (ref 8.9–10.3)
Chloride: 97 mmol/L — ABNORMAL LOW (ref 98–111)
Creatinine: 0.86 mg/dL (ref 0.61–1.24)
GFR, Estimated: >60 mL/min (ref >60)
Glucose, Bld: 168 mg/dL — ABNORMAL HIGH (ref 70–99)
Potassium: 4.2 mmol/L (ref 3.5–5.1)
Sodium: 131 mmol/L — ABNORMAL LOW (ref 135–145)
Total Bilirubin: 0.5 mg/dL (ref 0.0–1.2)
Total Protein: 6.4 g/dL — ABNORMAL LOW (ref 6.5–8.1)

## 2023-12-27 MED ORDER — DEXAMETHASONE SODIUM PHOSPHATE 10 MG/ML IJ SOLN
10.0000 mg | Freq: Once | INTRAMUSCULAR | Status: AC
Start: 2023-12-27 — End: 2023-12-27
  Administered 2023-12-27: 10 mg via INTRAVENOUS
  Filled 2023-12-27: qty 1

## 2023-12-27 MED ORDER — PROCHLORPERAZINE EDISYLATE 10 MG/2ML IJ SOLN
10.0000 mg | Freq: Once | INTRAMUSCULAR | Status: AC
  Administered 2023-12-27: 10 mg via INTRAVENOUS

## 2023-12-27 MED ORDER — MONTELUKAST SODIUM 10 MG PO TABS
10.0000 mg | ORAL_TABLET | Freq: Once | ORAL | Status: AC
Start: 2023-12-27 — End: 2023-12-27
  Administered 2023-12-27: 10 mg via ORAL
  Filled 2023-12-27: qty 1

## 2023-12-27 MED ORDER — FAMOTIDINE IN NACL 20-0.9 MG/50ML-% IV SOLN
20.0000 mg | Freq: Once | INTRAVENOUS | Status: AC | PRN
  Administered 2023-12-27: 20 mg via INTRAVENOUS

## 2023-12-27 MED ORDER — FAMOTIDINE IN NACL 20-0.9 MG/50ML-% IV SOLN
20.0000 mg | Freq: Once | INTRAVENOUS | Status: AC
Start: 2023-12-27 — End: 2023-12-27
  Administered 2023-12-27: 20 mg via INTRAVENOUS
  Filled 2023-12-27: qty 50

## 2023-12-27 MED ORDER — SODIUM CHLORIDE 0.9 % IV SOLN
INTRAVENOUS | Status: DC
  Filled 2023-12-27: qty 250

## 2023-12-27 MED ORDER — SODIUM CHLORIDE 0.9% FLUSH
10.0000 mL | Freq: Once | INTRAVENOUS | Status: AC
  Administered 2023-12-27: 10 mL via INTRAVENOUS
  Filled 2023-12-27: qty 10

## 2023-12-27 MED ORDER — VEMLIDY 25 MG PO TABS: 25.0000 mg | ORAL_TABLET | Freq: Every day | ORAL | 1 refills | Status: DC

## 2023-12-27 MED ORDER — ACETAMINOPHEN 325 MG PO TABS
650.0000 mg | ORAL_TABLET | Freq: Once | ORAL | Status: AC
  Administered 2023-12-27: 650 mg via ORAL
  Filled 2023-12-27: qty 2

## 2023-12-27 MED ORDER — SODIUM CHLORIDE 0.9 % IV SOLN
Freq: Once | INTRAVENOUS | Status: DC | PRN
  Filled 2023-12-27: qty 250

## 2023-12-27 MED ORDER — DIPHENHYDRAMINE HCL 50 MG/ML IJ SOLN
50.0000 mg | Freq: Once | INTRAMUSCULAR | Status: AC | PRN
  Administered 2023-12-27: 25 mg via INTRAVENOUS

## 2023-12-27 MED ORDER — DIPHENHYDRAMINE HCL 25 MG PO CAPS
50.0000 mg | ORAL_CAPSULE | Freq: Once | ORAL | Status: AC
  Administered 2023-12-27: 50 mg via ORAL
  Filled 2023-12-27: qty 2

## 2023-12-27 MED ORDER — HEPARIN SOD (PORK) LOCK FLUSH 100 UNIT/ML IV SOLN
500.0000 [IU] | Freq: Once | INTRAVENOUS | Status: DC | PRN
  Filled 2023-12-27: qty 5

## 2023-12-27 MED ORDER — SODIUM CHLORIDE 0.9 % IV SOLN
125.0000 mg/m2 | Freq: Once | INTRAVENOUS | Status: AC
  Administered 2023-12-27: 300 mg via INTRAVENOUS
  Filled 2023-12-27: qty 30

## 2023-12-27 MED ORDER — METHYLPREDNISOLONE SODIUM SUCC 125 MG IJ SOLR
125.0000 mg | Freq: Once | INTRAMUSCULAR | Status: AC | PRN
  Administered 2023-12-27: 125 mg via INTRAVENOUS

## 2023-12-27 MED ORDER — ALTEPLASE 2 MG IJ SOLR
2.0000 mg | Freq: Once | INTRAMUSCULAR | Status: AC | PRN
  Administered 2023-12-27: 2 mg
  Filled 2023-12-27: qty 2

## 2023-12-27 MED ORDER — PALONOSETRON HCL INJECTION 0.25 MG/5ML
0.2500 mg | Freq: Once | INTRAVENOUS | Status: AC
Start: 2023-12-27 — End: 2023-12-27
  Administered 2023-12-27: 0.25 mg via INTRAVENOUS
  Filled 2023-12-27: qty 5

## 2023-12-27 MED FILL — Dexamethasone Sodium Phosphate Inj 100 MG/10ML: INTRAMUSCULAR | Qty: 2 | Status: AC

## 2023-12-27 NOTE — Patient Instructions (Signed)
Rituximab Injection What is this medication? RITUXIMAB (ri TUX i mab) treats leukemia and lymphoma. It works by blocking a protein that causes cancer cells to grow and multiply. This helps to slow or stop the spread of cancer cells. It may also be used to treat autoimmune conditions, such as arthritis. It works by slowing down an overactive immune system. It is a monoclonal antibody. This medicine may be used for other purposes; ask your health care provider or pharmacist if you have questions. COMMON BRAND NAME(S): RIABNI, Rituxan, RUXIENCE, truxima What should I tell my care team before I take this medication? They need to know if you have any of these conditions: Chest pain Heart disease Immune system problems Infection, such as chickenpox, cold sores, hepatitis B, herpes Irregular heartbeat or rhythm Kidney disease Low blood counts, such as low white cells, platelets, red cells Lung disease Recent or upcoming vaccine An unusual or allergic reaction to rituximab, other medications, foods, dyes, or preservatives Pregnant or trying to get pregnant Breast-feeding How should I use this medication? This medication is injected into a vein. It is given by a care team in a hospital or clinic setting. A special MedGuide will be given to you before each treatment. Be sure to read this information carefully each time. Talk to your care team about the use of this medication in children. While this medication may be prescribed for children as young as 6 months for selected conditions, precautions do apply. Overdosage: If you think you have taken too much of this medicine contact a poison control center or emergency room at once. NOTE: This medicine is only for you. Do not share this medicine with others. What if I miss a dose? Keep appointments for follow-up doses. It is important not to miss your dose. Call your care team if you are unable to keep an appointment. What may interact with this  medication? Do not take this medication with any of the following: Live vaccines This medication may also interact with the following: Cisplatin This list may not describe all possible interactions. Give your health care provider a list of all the medicines, herbs, non-prescription drugs, or dietary supplements you use. Also tell them if you smoke, drink alcohol, or use illegal drugs. Some items may interact with your medicine. What should I watch for while using this medication? Your condition will be monitored carefully while you are receiving this medication. You may need blood work while taking this medication. This medication can cause serious infusion reactions. To reduce the risk your care team may give you other medications to take before receiving this one. Be sure to follow the directions from your care team. This medication may increase your risk of getting an infection. Call your care team for advice if you get a fever, chills, sore throat, or other symptoms of a cold or flu. Do not treat yourself. Try to avoid being around people who are sick. Call your care team if you are around anyone with measles, chickenpox, or if you develop sores or blisters that do not heal properly. Avoid taking medications that contain aspirin, acetaminophen, ibuprofen, naproxen, or ketoprofen unless instructed by your care team. These medications may hide a fever. This medication may cause serious skin reactions. They can happen weeks to months after starting the medication. Contact your care team right away if you notice fevers or flu-like symptoms with a rash. The rash may be red or purple and then turn into blisters or peeling of the skin.  You may also notice a red rash with swelling of the face, lips, or lymph nodes in your neck or under your arms. In some patients, this medication may cause a serious brain infection that may cause death. If you have any problems seeing, thinking, speaking, walking, or  standing, tell your care team right away. If you cannot reach your care team, urgently seek another source of medical care. Talk to your care team if you may be pregnant. Serious birth defects can occur if you take this medication during pregnancy and for 12 months after the last dose. You will need a negative pregnancy test before starting this medication. Contraception is recommended while taking this medication and for 12 months after the last dose. Your care team can help you find the option that works for you. Do not breastfeed while taking this medication and for at least 6 months after the last dose. What side effects may I notice from receiving this medication? Side effects that you should report to your care team as soon as possible: Allergic reactions or angioedema--skin rash, itching or hives, swelling of the face, eyes, lips, tongue, arms, or legs, trouble swallowing or breathing Bowel blockage--stomach cramping, unable to have a bowel movement or pass gas, loss of appetite, vomiting Dizziness, loss of balance or coordination, confusion or trouble speaking Heart attack--pain or tightness in the chest, shoulders, arms, or jaw, nausea, shortness of breath, cold or clammy skin, feeling faint or lightheaded Heart rhythm changes--fast or irregular heartbeat, dizziness, feeling faint or lightheaded, chest pain, trouble breathing Infection--fever, chills, cough, sore throat, wounds that don't heal, pain or trouble when passing urine, general feeling of discomfort or being unwell Infusion reactions--chest pain, shortness of breath or trouble breathing, feeling faint or lightheaded Kidney injury--decrease in the amount of urine, swelling of the ankles, hands, or feet Liver injury--right upper belly pain, loss of appetite, nausea, light-colored stool, dark yellow or brown urine, yellowing skin or eyes, unusual weakness or fatigue Redness, blistering, peeling, or loosening of the skin, including  inside the mouth Stomach pain that is severe, does not go away, or gets worse Tumor lysis syndrome (TLS)--nausea, vomiting, diarrhea, decrease in the amount of urine, dark urine, unusual weakness or fatigue, confusion, muscle pain or cramps, fast or irregular heartbeat, joint pain Side effects that usually do not require medical attention (report to your care team if they continue or are bothersome): Headache Joint pain Nausea Runny or stuffy nose Unusual weakness or fatigue This list may not describe all possible side effects. Call your doctor for medical advice about side effects. You may report side effects to FDA at 1-800-FDA-1088. Where should I keep my medication? This medication is given in a hospital or clinic. It will not be stored at home. NOTE: This sheet is a summary. It may not cover all possible information. If you have questions about this medicine, talk to your doctor, pharmacist, or health care provider.  2024 Elsevier/Gold Standard (2022-04-09 00:00:00)

## 2023-12-27 NOTE — Progress Notes (Unsigned)
Prior to initiating treatment port a cath assessed if blood return was still noted.  Port did not give blood return. Per Dr. Smith Jehu,  ok to give Cathflo activase and Rituximab through peripheral IV today. Peripheral IV inserted without any complications.  Cathflo activase administered in port at 1154. At 1230, blood return noted and Rituximab started in port a cath.

## 2023-12-27 NOTE — Progress Notes (Signed)
Patient did not finish first dose (12/27/23) of ruxience 300 mg due to only tolerating slower infusion time. Remaining 117.5 mg (~110 mL)  of 12/27/23 bag will be capped and stored in pharmacy to resume 12/28/23 AM before starting new 300 mg bag scheduled for 12/28/23. Pre-meds updated and orders updated to reflect continuation of bag.    Sharen Hones, PharmD, BCPS Clinical Pharmacist

## 2023-12-27 NOTE — Progress Notes (Unsigned)
Hypersensitivity Reaction note  Date of event: 12/27/23 Time of event: 1217 Generic name of drug involved: Rituximab Name of provider notified of the hypersensitivity reaction: Consuello Masse, Dr. Owens Shark Was agent that likely caused hypersensitivity reaction added to Allergies List within EMR? Yes  Rituximab started and administered as ordered in treatment plan. Patient start rate at 47 ml/hr. At 1217, Patient began to complain that his throat was feeling tight 15 minutes after initiating treatment. Patient became flushed and diaphoretic. At 1218: IV NS bolus, Benadryl, Solumedrol, and Pepcid was given as ordered during hypersensitivity reaction.  At 1220, Consuello Masse and Dr. Smith Kash at chairside to assess patient and he then began vomiting.  1224: 10 mg of compazine given. Vital signs remained stable.  At 1233, flushing, nausea, and diaphoresis had resolved. Vitals stable. Per Consuello Masse, continue IV fluids until symptoms completely resolve. At 1300, Rituximab started at 50% of the initial rate. Patient tolerated rate and then increased per Lauren Allen's/ Dr. Assunta Gambles recommendations. (See MAR) Patient is currently receiving Rituxmab at 47 ml/hr at max rate until 5 pm. Patient tolerating treatment now without any complications. Vitals still stable.   Plan tomorrow for treatment is: Initiate at 23.5 ml/hr, if tolerating, after 15 minutes, increase to 35.25 ml/hr. If tolerating, after 15 minutes, increase to max daily goal rate of 47 ml/hour.   Theodoro Clock, RN 12/27/2023 2:37 PM

## 2023-12-27 NOTE — Progress Notes (Signed)
Patient had vomiting during infusion and throat tightness. Infusion was stopped. He received solumedrol, benadryl, and pepcid, along with iv fluids. Compazine for vomiting. Symptoms resolved. We re-challenged at reduced rate and uptitrated to goal rate of 47 ml/hr. Tolerated well. Plan to increase chair time to 9 hours and will initiate at reduced rate then uptitrate to see if this improves his tolerance. All of this was discussed with Dr Smith Ayaan and nursing who all contributed to plan. Orders updated in IS to reflect chair time and plan for titration.

## 2023-12-27 NOTE — Progress Notes (Signed)
Clinical Pharmacist Practitioner [facility]  [phone] [phone]  Patient Care Team: [name], NP as PCP - General (Nurse Practitioner) Debbe Odea, MD as PCP - Cardiology (Cardiology) Creig Hines, MD as Consulting Physician (Oncology) Salena Saner, MD as Consulting Physician (Pulmonary Disease) Rodney Langton, RN as [facility] Care Management   Name of the patient: Russell Simpson  [id number]  [birth year]   Date of visit: 12/27/23  HPI: Patient is a 63 y.o. 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 Planned treatment with lenalidomide and rituximab, to start today 12/27/23.   Reason for Consult: Lenalidomide oral chemotherapy education.   PAST MEDICAL HISTORY: 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HCC)    Arthritis    Asthma    Basal cell carcinoma 01/28/2021   R upper arm, EDC 03/04/2021   Basal cell carcinoma 12/31/2022   Right temporal hair line. Nodular. 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   left upper back ED&C done   Hypertension    Hyponatremia    Inappropriate sinus tachycardia (HCC)    Non Hodgkin's lymphoma (HCC)    Pneumonia    RSV (respiratory syncytial virus infection)    SCC (squamous cell carcinoma) 08/28/2021    upper chest right of midline, EDC done 09/17/2021   SCC (squamous cell carcinoma) 09/30/2022   left chest  ED&C done   Sleep apnea    does not wear CPAP ; uses humidifier instead   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   right upper back, [facility] 06/18/2022   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   right upper back ED&C done   Squamous cell carcinoma of skin 02/19/2021   L inferior mandible, treated with EDC   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/28/2021   R ant shoulder - ED&C   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/28/2021   L upper abdomen - ED&C    HEMATOLOGY/ONCOLOGY HISTORY:  Oncology History  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)  04/03/2021 Initial Diagnosis   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)   04/04/2021 Cancer Staging   Staging form: Hodgkin and Non-Hodgkin Lymphoma, AJCC 8th Edition - Clinical stage from 04/04/2021: Stage IV (Follicular lymphoma) - Signed by Creig Hines, MD on 04/04/2021 Stage prefix: Initial diagnosis   04/08/2021 - 09/11/2021 Chemotherapy   Patient is on Treatment Plan : NON-HODGKINS LYMPHOMA Rituximab D1 + Bendamustine D1,2 q28d x 6 cycles     12/27/2023 -  Chemotherapy   Patient is on Treatment Plan : LYMPHOMA Lenalidomide + Rituximab q28d       ALLERGIES:  is allergic to penicillins, tetanus toxoids, lisinopril, losartan, codeine, doxycycline, and  ruxience [rituximab-pvvr].  MEDICATIONS:  Current Outpatient Medications  Medication Sig Dispense Refill   acetaminophen (TYLENOL) 325 MG tablet Take 2 tablets (650 mg) by mouth starting 2 days prior to chemo then 3 days after chemotherapy. Do not take on day of chemotherapy 90 tablet 0   albuterol (VENTOLIN HFA) 108 (90 Base) MCG/ACT inhaler Inhale 2 puffs into the lungs every 6 (six) hours as needed for wheezing or shortness of breath. 18 g 6   amLODipine (NORVASC) 2.5 MG tablet TAKE 1 TABLET BY MOUTH ONCE DAILY 90 tablet 0    atorvastatin (LIPITOR) 80 MG tablet Take 1 tablet (80 mg total) by mouth daily. 15 tablet 0   azithromycin (ZITHROMAX) 250 MG tablet Take 2 tablets on day 1, then 1 tablet daily on days 2 through 5 6 tablet 0   Blood Glucose Monitoring Suppl (ONE TOUCH ULTRA 2) w/Device KIT 1 each by Does not apply route daily. 1 kit 0   buPROPion (WELLBUTRIN XL) 300 MG 24 hr tablet Take 300 mg by mouth daily.     cetirizine (ZYRTEC) 10 MG tablet Take 1 tablet (10 mg total) by mouth daily. 30 tablet 11   clonazePAM (KLONOPIN) 0.5 MG tablet Take 0.5 mg by mouth every evening.     diphenhydrAMINE (BENADRYL) 25 mg capsule Starting 2 days prior to your chemotherapy infusions, take 1 pill (25 mg) by mouth daily. Do not take on day of chemotherapy. 30 capsule 0   DULoxetine (CYMBALTA) 60 MG capsule Take 60 mg by mouth every evening.     famotidine (PEPCID) 20 MG tablet Take 1 tablet (20 mg total) by mouth 2 (two) times daily. 180 tablet 1   famotidine (PEPCID) 20 MG tablet Starting 2 days prior to your chemotherapy infusions, take 1 pill (20 mg) by mouth daily. Do not take on day of chemotherapy. 60 tablet 0   finasteride (PROSCAR) 5 MG tablet TAKE 1 TABLET BY MOUTH ONCE DAILY 90 tablet 3   fluticasone (FLONASE) 50 MCG/ACT nasal spray Place 2 sprays into both nostrils daily. 16 g 6   Fluticasone-Umeclidin-Vilant (TRELEGY ELLIPTA) 200-62.5-25 MCG/ACT AEPB Inhale 1 puff into the lungs daily. 14 each 0   glucose blood (ONETOUCH ULTRA) test strip 1 each by Other route daily. Use as instructed 100 each 3   Lancets (ONETOUCH ULTRASOFT) lancets 1 each by Other route daily. Use as instructed 100 each 3   lenalidomide (REVLIMID) 15 MG capsule Take 1 capsule (15 mg total) by mouth daily. Take for 21 days, then hold for 7 days. Repeat every 28 days. 21 capsule 0   metoprolol succinate (TOPROL-XL) 100 MG 24 hr tablet TAKE 1 TABLET BY MOUTH ONCE DAILY WITH OR IMMEDIATELY FOLLOWING A MEAL 90 tablet 0   mirtazapine (REMERON) 15 MG  tablet Take 15 mg by mouth at bedtime.     montelukast (SINGULAIR) 10 MG tablet TAKE 1 TABLET EVERY DAY 90 tablet 1   montelukast (SINGULAIR) 10 MG tablet Starting 2 days prior to your chemotherapy infusions, take 1 pill (10 mg) by mouth at night. Do not take on day of chemotherapy. 30 tablet 0   naltrexone (DEPADE) 50 MG tablet Take 50 mg by mouth daily.     OLANZapine (ZYPREXA) 5 MG tablet Take 5 mg by mouth at bedtime.     ondansetron (ZOFRAN) 4 MG tablet Take 1 tablet (4 mg total) by mouth every 8 (eight) hours as needed for nausea or vomiting. 30 tablet 1   predniSONE (DELTASONE) 50  MG tablet Take 1 tablet (50 mg) by mouth starting 2 days prior to chemo then 3 days after chemotherapy. Do not take on day of chemotherapy 30 tablet 0   sildenafil (VIAGRA) 100 MG tablet Take 1 tablet (100 mg total) by mouth daily as needed for erectile dysfunction. Take two hours prior to intercourse on an empty stomach 30 tablet 3   tenofovir (VIREAD) 300 MG tablet TAKE 1 TABLET BY MOUTH ONCE DAILY 90 tablet 3   testosterone cypionate (DEPOTESTOSTERONE CYPIONATE) 200 MG/ML injection Inject 1 mL (200 mg total) into the muscle every 14 (fourteen) days. INJECT INTRAMUSCULARLY EVERY 14 DAYS 10 mL 0   No current facility-administered medications for this visit.   Facility-Administered Medications Ordered in Other Visits  Medication Dose Route Frequency Provider Last Rate Last Admin   heparin lock flush 100 unit/mL  500 Units Intravenous Once Creig Hines, MD       heparin lock flush 100 unit/mL  500 Units Intravenous Once Creig Hines, MD       sodium chloride flush (NS) 0.9 % injection 10 mL  10 mL Intravenous PRN Creig Hines, MD        VITAL SIGNS: There were no vitals taken for this visit. There were no vitals filed for this visit.  Estimated body mass index is 30.94 kg/m as calculated from the following:   Height as of 12/24/23: 6\' 2"  (1.88 m).   Weight as of an earlier encounter on 12/27/23:  109.3 kg (241 lb).  LABS: CBC:    Component Value Date/Time   WBC 14.0 (H) 11/09/2023 1354   HGB 12.4 (L) 11/09/2023 1354   HGB 13.1 11/02/2023 1434   HCT 35.8 (L) 11/09/2023 1354   HCT 38.7 11/02/2023 1434   PLT 114 (L) 11/09/2023 1354   PLT 109 (L) 11/02/2023 1434   MCV 91.6 11/09/2023 1354   MCV 93 11/02/2023 1434   MCV 96 07/19/2013 0548   NEUTROABS 4.8 11/09/2023 1354   NEUTROABS 4.5 11/02/2023 1434   NEUTROABS 3.8 07/19/2013 0548   LYMPHSABS 7.2 (H) 11/09/2023 1354   LYMPHSABS 6.7 (H) 11/02/2023 1434   LYMPHSABS 2.4 07/19/2013 0548   MONOABS 1.0 11/09/2023 1354   MONOABS 0.7 07/19/2013 0548   EOSABS 0.2 11/09/2023 1354   EOSABS 0.0 11/02/2023 1434   EOSABS 0.0 07/19/2013 0548   BASOSABS 0.1 11/09/2023 1354   BASOSABS 0.0 11/02/2023 1434   BASOSABS 0.0 07/19/2013 0548   Comprehensive Metabolic Panel:    Component Value Date/Time   NA 135 11/09/2023 1354   NA 131 (L) 11/02/2023 1434   NA 139 07/19/2013 0548   K 4.2 11/09/2023 1354   K 3.7 07/19/2013 0548   CL 100 11/09/2023 1354   CL 105 07/19/2013 0548   CO2 24 11/09/2023 1354   CO2 24 07/19/2013 0548   BUN 11 11/09/2023 1354   BUN 10 11/02/2023 1434   BUN 12 07/19/2013 0548   CREATININE 1.09 11/09/2023 1354   CREATININE 1.14 10/15/2017 0906   GLUCOSE 104 (H) 11/09/2023 1354   GLUCOSE 98 07/19/2013 0548   CALCIUM 8.9 11/09/2023 1354   CALCIUM 8.8 07/19/2013 0548   AST 29 11/09/2023 1354   ALT 22 11/09/2023 1354   ALT 39 07/19/2013 0548   ALKPHOS 148 (H) 11/09/2023 1354   ALKPHOS 87 07/19/2013 0548   BILITOT 0.8 11/09/2023 1354   PROT 6.5 11/09/2023 1354   PROT 6.2 11/02/2023 1434   PROT 7.4 07/19/2013 0548  ALBUMIN 4.0 11/09/2023 1354   ALBUMIN 4.6 11/02/2023 1434   ALBUMIN 4.2 07/19/2013 0548     Present during today's visit: patient and his girlfriend Harriett Sine, seen in infusion  Start plan: patient to start lenalidomide today 12/27/23   Patient Education I spoke with patient for overview of  new oral chemotherapy medication: lenalidomide   Administration: Counseled patient on administration, dosing, side effects, monitoring, drug-food interactions, safe handling, storage, and disposal. Patient will take lenalidomide 1 capsule (15 mg total) by mouth daily. Take for 21 days, then hold for 7 days. Repeat every 28 days.  He will also take aspirin 81mg  daily.   Side Effects: Side effects include but not limited to: rash/itchy skin, N/V, fatigue, decreased wbc/hgb/plt, constipation or diarrhea. Rash: Patient knows to call the office if he is having a rash Diarrhea: patient will use loperamide as needed and call the office is he is having 4 or more loose stools per day  Drug-drug Interactions (DDI): No current DDIs with lenalidomide  Adherence: After discussion with patient no patient barriers to medication adherence identified. Reviewed with patient importance of keeping a medication schedule and plan for any missed doses.  Other:  Patient will switch from [facility]ire[facility] to [facility] for Hep B reactivation prophylaxis  Mr. Schloemer voiced understanding and appreciation. All questions answered. Medication handout provided.  Provided patient with Oral Chemotherapy Navigation [facility] phone number. Patient knows to call the office with questions or concerns. Oral Chemotherapy Navigation Clinic will continue to follow.  Patient expressed understanding and was in agreement with this plan. He also understands that He can call clinic at any time with any questions, concerns, or complaints.   Medication Access Issues: No issue, patient filling at [facility]  Follow-up plan: RTC as scheduled  Thank you for allowing me to participate in the care of this patient.   Time Total: 15 mins  Visit consisted of counseling and education on dealing with issues of symptom management in the setting of serious and potentially life-threatening illness.Greater than 50%  of this time was  spent counseling and coordinating care related to the above assessment and plan.  Signed by: Remi Haggard, PharmD, Nolon Bussing, CPP Hematology/Oncology Clinical Pharmacist Practitioner [facility] [phone]  12/27/2023 9:16 AM

## 2023-12-27 NOTE — Progress Notes (Signed)
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 [name]  He sustained a left anterior frontal sinus as well as supraorbital rim fracture on 11/21/2019 which was managed conservatively.  He then presented to the ER at [facility] with symptoms of right-sided chest wall pain this led to a CT PE which did not show any evidence of pulmonary embolism.  He was noted to have bilateral symmetric axillary adenopathy measuring up to 1.8 cm.  Scattered mediastinal adenopathy.  Right paratracheal node measuring up to 1.2 cm.  Prominent right costophrenic lymph node measuring up to 1 cm.  Findings are nonspecific but could be seen with lymphoma versus systemic inflammatory disease such as sarcoidosis.  Of note patient has had a prior CT scan for lung screening back in 2015 when he was not noted to have this adenopathy.    Repeat CT chest in August 2021 showed persistent mild nonbulky axillary and mediastinal adenopathy which had not changed significantly as compared to his prior CT in December 2020.  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   Repeat  CT in March 2022 showed Progression and multistation adenopathy as well as significant splenomegaly measuring 20.4 cm as compared to 14.5 cm on CT scan September 2021.  CT scan showed multistation adenopathy with SUVs ranging between 6-8.9.  Patient underwent another core biopsy of right inguinal lymph node which was consistent with follicular lymphoma grade 1-2.  Bone marrow biopsy also showed moderate involvement with non-Hodgkin's B-cell lymphoma.  Baseline hepatitis B testing showed core antibody positivity.  Seen by GI and started on tenofovir   Patient had symptoms of nausea and sweating during cycle 1 of Rituxan which resolved with additional premedications.  He developed symptoms of itching over neck chest back and bilateral eyes with second dose of Rituxan early and had to get more premedications and was not rechallenged on the same day.  Patient subsequently received cycle 3 of Bendamustine Rituxan chemotherapy after Rituxan was given as a split dose over 3 days   Scans after 6 cycles of Bendamustine Rituxan chemotherapy in October 2022 showed significant improvement with therapy.  Lymphadenopathy in his neck and mediastinum resolved Deauville 2.  Decrease in the splenic size and hypermetabolism.  No new lesions.  FDG accumulation within the skeleton presumably due to stimulatory effects of treatment.   Scans in April 2023 showed pulmonary nodules requiring further assessment and patient was seen by pulmonary.  He also had a PET CT scan in October 2023 which showed gradual enlargement and hypermetabolic some noted in the lung nodules.  Patient also has intra-abdominal lymph nodes ranging from 9 to 1.2 cm with an SUV less than 3.  Patient had right lower lobe lung biopsy which  was negative for malignancy.  Due to continued increase in the size of his lymph nodes as well as splenomegaly with a spleen size of 20 cm plan was to offer second line treatment with Rituxan and Revlimid  Interval  history-he is doing well overall today.  Denies any specific complaints at this time  ECOG PS- 1 Pain scale- 0   Review of systems- Review of Systems  Constitutional:  Negative for chills, fever, malaise/fatigue and weight loss.  HENT:  Negative for congestion, ear discharge and nosebleeds.   Eyes:  Negative for blurred vision.  Respiratory:  Negative for cough, hemoptysis, sputum production, shortness of breath and wheezing.   Cardiovascular:  Negative for chest pain, palpitations, orthopnea and claudication.  Gastrointestinal:  Negative for abdominal pain, blood in stool, constipation, diarrhea, heartburn, melena, nausea and vomiting.  Genitourinary:  Negative for dysuria, flank pain, frequency, hematuria and urgency.  Musculoskeletal:  Negative for back pain, joint pain and myalgias.  Skin:  Negative for rash.  Neurological:  Negative for dizziness, tingling, focal weakness, seizures, weakness and headaches.  Endo/Heme/Allergies:  Does not bruise/bleed easily.  Psychiatric/Behavioral:  Negative for depression and suicidal ideas. The patient does not have insomnia.       Allergies  Allergen Reactions   Penicillins Anaphylaxis    Tolerates cefdinir, cephalexin and amoxicillin/clavulonate so true PCN allergy unlikely   Tetanus Toxoids Swelling   Lisinopril Cough   Losartan      Other reaction(s): Muscle Pain     Codeine Nausea Only and Nausea And Vomiting   Doxycycline Rash   Ruxience [Rituximab-Pvvr] Rash    05/06/21 pt w/ worsening rash during Ruxience infusion.  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HCC)    Arthritis    Asthma    Basal cell carcinoma 01/28/2021   R upper arm, EDC 03/04/2021   Basal cell carcinoma 12/31/2022   Right temporal hair line. Nodular. 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   left upper back ED&C done   Hypertension    Hyponatremia    Inappropriate sinus tachycardia (HCC)    Non Hodgkin's lymphoma (HCC)    Pneumonia    RSV (respiratory syncytial virus infection)    SCC (squamous cell carcinoma) 08/28/2021   upper chest right of midline, EDC done 09/17/2021   SCC (squamous cell carcinoma) 09/30/2022   left chest  ED&C done   Sleep apnea    does not wear CPAP ; uses humidifier instead   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   right upper back, ED&C 06/18/2022   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   right upper back ED&C done   Squamous cell carcinoma of  skin 02/19/2021   L inferior mandible, treated with EDC   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/28/2021   R ant shoulder - ED&C   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/28/2021   L upper abdomen - ED&C     Past Surgical History:  Procedure Laterality Date   ANTERIOR CERVICAL DECOMP/DISCECTOMY FUSION N/A 06/17/2016   Procedure: ANTERIOR CERVICAL DECOMPRESSION FUSION, CERVICAL 3-4, CERVICAL 4-5 WITH INSTRUMENTATION AND ALLOGRAFT;  Surgeon: Estill Bamberg, MD;  Location: [facility] OR;  Service: Orthopedics;  Laterality: N/A;  ANTERIOR CERVICAL DECOMPRESSION FUSION, CERVICAL 3-4, CERVICAL 4-5 WITH INSTRUMENTATION AND ALLOGRAFT   BACK SURGERY     x3   COLONOSCOPY WITH PROPOFOL N/A 12/10/2023   Procedure: COLONOSCOPY WITH PROPOFOL;  Surgeon: Toney Reil, MD;  Location: [facility]  ENDOSCOPY;  Service: Gastroenterology;  Laterality: N/A;   HARDWARE REMOVAL Right 05/04/2023   Procedure: HARDWARE REMOVAL;  Surgeon: Juanell Fairly, MD;  Location: [facility] ORS;  Service: Orthopedics;  Laterality: Right;   HEMOSTASIS CLIP PLACEMENT  12/10/2023   Procedure: HEMOSTASIS CLIP PLACEMENT;  Surgeon: Toney Reil, MD;  Location: [facility] ENDOSCOPY;  Service: Gastroenterology;;   HEMOSTASIS CONTROL  12/10/2023   Procedure: HEMOSTASIS CONTROL;  Surgeon: Toney Reil, MD;  Location: [facility] ENDOSCOPY;  Service: Gastroenterology;;   NECK SURGERY  12/01/2007   ORIF ANKLE FRACTURE Right 12/02/2022   Procedure: OPEN REDUCTION INTERNAL FIXATION (ORIF) ANKLE FRACTURE;  Surgeon: Juanell Fairly, MD;  Location: [facility] ORS;  Service: Orthopedics;  Laterality: Right;   ORIF ANKLE FRACTURE Right 12/01/2022   Procedure: OPEN REDUCTION INTERNAL FIXATION (ORIF) ANKLE FRACTURE;  Surgeon: Juanell Fairly, MD;  Location: [facility] ORS;  Service: Orthopedics;  Laterality: Right;   POLYPECTOMY  12/10/2023   Procedure: POLYPECTOMY;  Surgeon: Toney Reil, MD;  Location: [facility] ENDOSCOPY;  Service: Gastroenterology;;   PORTA CATH INSERTION N/A 04/03/2021   Procedure: PORTA CATH INSERTION;  Surgeon: Annice Needy, MD;  Location: [facility] INVASIVE CV LAB;  Service: Cardiovascular;  Laterality: N/A;   SUBMUCOSAL LIFTING INJECTION  12/10/2023   Procedure: SUBMUCOSAL LIFTING INJECTION;  Surgeon: Toney Reil, MD;  Location: [facility] ENDOSCOPY;  Service: Gastroenterology;;   SUBMUCOSAL TATTOO INJECTION  12/10/2023   Procedure: SUBMUCOSAL TATTOO INJECTION;  Surgeon: Toney Reil, MD;  Location: [facility] ENDOSCOPY;  Service: Gastroenterology;;   VIDEO BRONCHOSCOPY WITH ENDOBRONCHIAL ULTRASOUND Bilateral 10/14/2022   Procedure: VIDEO BRONCHOSCOPY WITH ENDOBRONCHIAL ULTRASOUND;  Surgeon: Salena Saner, MD;  Location: [facility] ORS;  Service: Pulmonary;  Laterality: Bilateral;    Social History   Socioeconomic History    Marital status: Divorced    Spouse name: Not on file   Number of children: Not on file   Years of education: Not on file   Highest education level: Not on file  Occupational History   Not on file  Tobacco Use   Smoking status: Former    Types: Cigars    Quit date: 11/30/2022    Years since quitting: 1.0   Smokeless tobacco: Never  Vaping Use   Vaping status: Former   Start date: 04/30/2017   Quit date: 11/30/2017  Substance and Sexual Activity   Alcohol use: Yes    Comment: beers 6 a day   Drug use: No   Sexual activity: Not Currently  Other Topics Concern   Not on file  Social History Narrative   Not on file   Social Drivers of Health   Financial Resource Strain: Medium Risk (04/05/2023)   Overall Financial Resource Strain (CARDIA)  Difficulty of Paying Living Expenses: Somewhat hard  Food Insecurity: Food Insecurity Present (05/07/2023)   Received from [facility], [facility]   Hunger Vital Sign    Worried About Running Out of Food in the Last Year: Sometimes true    Ran Out of Food in the Last Year: Sometimes true  Transportation Needs: No Transportation Needs (04/05/2023)   PRAPARE - Administrator, Civil Service (Medical): No    Lack of Transportation (Non-Medical): No  Physical Activity: Inactive (01/01/2023)   Exercise Vital Sign    Days of Exercise per Week: 0 days    Minutes of Exercise per Session: 0 min  Stress: No Stress Concern Present (04/05/2023)   [name] of Occupational Health - Occupational Stress Questionnaire    Feeling of Stress : Only a little  Social Connections: Socially Isolated (04/05/2023)   Social Connection and Isolation Panel [NHANES]    Frequency of Communication with Friends and Family: More than three times a week    Frequency of Social Gatherings with Friends and Family: Twice a week    Attends Religious Services: Never    Database administrator or Organizations: No    Attends Banker Meetings: Never     Marital Status: Divorced  Catering manager Violence: Not At Risk (04/05/2023)   Humiliation, Afraid, Rape, and Kick questionnaire    Fear of Current or Ex-Partner: No    Emotionally Abused: No    Physically Abused: No    Sexually Abused: No    Family History  Problem Relation Age of Onset   Diabetes Mother    Heart attack Father    Emphysema Father    Diabetes Other      Current Outpatient Medications:    acetaminophen (TYLENOL) 325 MG tablet, Take 2 tablets (650 mg) by mouth starting 2 days prior to chemo then 3 days after chemotherapy. Do not take on day of chemotherapy, Disp: 90 tablet, Rfl: 0   albuterol (VENTOLIN HFA) 108 (90 Base) MCG/ACT inhaler, Inhale 2 puffs into the lungs every 6 (six) hours as needed for wheezing or shortness of breath., Disp: 18 g, Rfl: 6   amLODipine (NORVASC) 2.5 MG tablet, TAKE 1 TABLET BY MOUTH ONCE DAILY, Disp: 90 tablet, Rfl: 0   atorvastatin (LIPITOR) 80 MG tablet, Take 1 tablet (80 mg total) by mouth daily., Disp: 15 tablet, Rfl: 0   azithromycin (ZITHROMAX) 250 MG tablet, Take 2 tablets on day 1, then 1 tablet daily on days 2 through 5, Disp: 6 tablet, Rfl: 0   Blood Glucose Monitoring Suppl (ONE TOUCH ULTRA 2) w/Device KIT, 1 each by Does not apply route daily., Disp: 1 kit, Rfl: 0   buPROPion (WELLBUTRIN XL) 300 MG 24 hr tablet, Take 300 mg by mouth daily., Disp: , Rfl:    cetirizine (ZYRTEC) 10 MG tablet, Take 1 tablet (10 mg total) by mouth daily., Disp: 30 tablet, Rfl: 11   clonazePAM (KLONOPIN) 0.5 MG tablet, Take 0.5 mg by mouth every evening., Disp: , Rfl:    diphenhydrAMINE (BENADRYL) 25 mg capsule, Starting 2 days prior to your chemotherapy infusions, take 1 pill (25 mg) by mouth daily. Do not take on day of chemotherapy., Disp: 30 capsule, Rfl: 0   DULoxetine (CYMBALTA) 60 MG capsule, Take 60 mg by mouth every evening., Disp: , Rfl:    famotidine (PEPCID) 20 MG tablet, Take 1 tablet (20 mg total) by mouth 2 (two) times daily., Disp:  180 tablet,  Rfl: 1   famotidine (PEPCID) 20 MG tablet, Starting 2 days prior to your chemotherapy infusions, take 1 pill (20 mg) by mouth daily. Do not take on day of chemotherapy., Disp: 60 tablet, Rfl: 0   finasteride (PROSCAR) 5 MG tablet, TAKE 1 TABLET BY MOUTH ONCE DAILY, Disp: 90 tablet, Rfl: 3   fluticasone (FLONASE) 50 MCG/ACT nasal spray, Place 2 sprays into both nostrils daily., Disp: 16 g, Rfl: 6   Fluticasone-Umeclidin-Vilant (TRELEGY ELLIPTA) 200-62.5-25 MCG/ACT AEPB, Inhale 1 puff into the lungs daily., Disp: 14 each, Rfl: 0   glucose blood (ONETOUCH ULTRA) test strip, 1 each by Other route daily. Use as instructed, Disp: 100 each, Rfl: 3   Lancets (ONETOUCH ULTRASOFT) lancets, 1 each by Other route daily. Use as instructed, Disp: 100 each, Rfl: 3   lenalidomide (REVLIMID) 15 MG capsule, Take 1 capsule (15 mg total) by mouth daily. Take for 21 days, then hold for 7 days. Repeat every 28 days., Disp: 21 capsule, Rfl: 0   metoprolol succinate (TOPROL-XL) 100 MG 24 hr tablet, TAKE 1 TABLET BY MOUTH ONCE DAILY WITH OR IMMEDIATELY FOLLOWING A MEAL, Disp: 90 tablet, Rfl: 0   mirtazapine (REMERON) 15 MG tablet, Take 15 mg by mouth at bedtime., Disp: , Rfl:    montelukast (SINGULAIR) 10 MG tablet, TAKE 1 TABLET EVERY DAY, Disp: 90 tablet, Rfl: 1   montelukast (SINGULAIR) 10 MG tablet, Starting 2 days prior to your chemotherapy infusions, take 1 pill (10 mg) by mouth at night. Do not take on day of chemotherapy., Disp: 30 tablet, Rfl: 0   naltrexone (DEPADE) 50 MG tablet, Take 50 mg by mouth daily., Disp: , Rfl:    OLANZapine (ZYPREXA) 5 MG tablet, Take 5 mg by mouth at bedtime., Disp: , Rfl:    ondansetron (ZOFRAN) 4 MG tablet, Take 1 tablet (4 mg total) by mouth every 8 (eight) hours as needed for nausea or vomiting., Disp: 30 tablet, Rfl: 1   predniSONE (DELTASONE) 50 MG tablet, Take 1 tablet (50 mg) by mouth starting 2 days prior to chemo then 3 days after chemotherapy. Do not take on day of  chemotherapy, Disp: 30 tablet, Rfl: 0   sildenafil (VIAGRA) 100 MG tablet, Take 1 tablet (100 mg total) by mouth daily as needed for erectile dysfunction. Take two hours prior to intercourse on an empty stomach, Disp: 30 tablet, Rfl: 3   testosterone cypionate (DEPOTESTOSTERONE CYPIONATE) 200 MG/ML injection, Inject 1 mL (200 mg total) into the muscle every 14 (fourteen) days. INJECT INTRAMUSCULARLY EVERY 14 DAYS, Disp: 10 mL, Rfl: 0   tenofovir alafenamide (VEMLIDY) 25 MG tablet, Take 1 tablet (25 mg total) by mouth daily., Disp: 30 tablet, Rfl: 1 No current facility-administered medications for this visit.  Facility-Administered Medications Ordered in Other Visits:    0.9 %  sodium chloride infusion, , Intravenous, Continuous, Alinda Dooms, NP, Last Rate: 10 mL/hr at 12/27/23 1021, New Bag at 12/27/23 1021   heparin lock flush 100 unit/mL, 500 Units, Intravenous, Once, Creig Hines, MD   heparin lock flush 100 unit/mL, 500 Units, Intravenous, Once, Creig Hines, MD   heparin lock flush 100 unit/mL, 500 Units, Intracatheter, Once PRN, Alinda Dooms, NP   sodium chloride flush (NS) 0.9 % injection 10 mL, 10 mL, Intravenous, PRN, Creig Hines, MD  Physical exam:  Vitals:   12/27/23 0904  BP: 120/71  Pulse: 87  Resp: 17  Temp: (!) 96.9 F (36.1 C)  TempSrc: Tympanic  SpO2: 96%  Weight: 241 lb (109.3 kg)   Physical Exam Cardiovascular:     Rate and Rhythm: Normal rate and regular rhythm.     Heart sounds: Normal heart sounds.  Pulmonary:     Effort: Pulmonary effort is normal.     Breath sounds: Normal breath sounds.  Skin:    General: Skin is warm and dry.  Neurological:     Mental Status: He is alert and oriented to person, place, and time.         Latest Ref Rng & Units 12/27/2023    8:41 AM  CMP  Glucose 70 - 99 mg/dL 161   BUN 8 - 23 mg/dL 15   Creatinine 0.96 - 1.24 mg/dL 0.45   Sodium 409 - 811 mmol/L 131   Potassium 3.5 - 5.1 mmol/L 4.2   Chloride 98  - 111 mmol/L 97   CO2 22 - 32 mmol/L 24   Calcium 8.9 - 10.3 mg/dL 8.6   Total Protein 6.5 - 8.1 g/dL 6.4   Total Bilirubin 0.0 - 1.2 mg/dL 0.5   Alkaline Phos 38 - 126 U/L 135   AST 15 - 41 U/L 31   ALT 0 - 44 U/L 24       Latest Ref Rng & Units 12/27/2023    8:41 AM  CBC  WBC 4.0 - 10.5 K/uL 11.9   Hemoglobin 13.0 - 17.0 g/dL 91.4   Hematocrit 78.2 - 52.0 % 33.9   Platelets 150 - 400 K/uL 146     Assessment and plan- Patient is a 63 y.o. male with history of recurrent follicular lymphoma here to discuss further management  Patient has previously reacted to Rituxan and therefore he will be receiving Rituxan as a split dose over 3 days.  He will also be getting multiple premeds today and he was also asked to take the following premeds prior to start of Rituxan:   Prednisone 50 mg starting 2 days prior to chemotherapy for a total of 5 days and not to take on the day of chemotherapy. Singulair 10 mg daily starting 2 days prior to chemotherapy for 5 days Benadryl 25 mg starting 2 days prior to chemotherapy for 5 days and not to take on the day of chemotherapy Tylenol 650 mg daily for 5 days starting 2 days prior to chemotherapy Pepcid 20 mg twice daily starting 2 days prior to chemotherapy and not to take on the day of chemotherapy.   He will directly proceed for cycle 1 day 8 of Rituxan next week by covering NP in 2 weeks for cycle 1 day 15.  I will see him back for cycle 2-day 1 of Rituxan.  He will receive 4 weekly cycles of Rituxan for cycle 1 and Rituxan q. 28 days for cycles 2 to cycle 6.  He will start taking Revlimid 15 mg daily 3 weeks on and 1 week off starting today.  Patient has hepatitis B core antibody positive and therefore will remain on tenofovir prophylaxis which is what he did not with Bendamustine and Rituxan as well.   Visit Diagnosis 1. 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)   2. High risk medication use   3. Encounter for monitoring  rituximab therapy      Dr. Owens Shark, MD, MPH [facility] [phone] 12/27/2023 12:38 PM

## 2023-12-27 NOTE — Addendum Note (Signed)
Addended by: Creig Hines on: 12/27/2023 04:16 PM   Modules accepted: Orders

## 2023-12-27 NOTE — Progress Notes (Signed)
Patient here for oncology follow-up appointment, expresses concerns of neuropathy not improving

## 2023-12-28 ENCOUNTER — Ambulatory Visit: Payer: Medicare HMO

## 2023-12-28 ENCOUNTER — Encounter: Payer: Self-pay | Admitting: Oncology

## 2023-12-28 ENCOUNTER — Inpatient Hospital Stay: Payer: Medicare HMO

## 2023-12-28 VITALS — BP 140/86 | HR 95 | Temp 97.8°F | Resp 18

## 2023-12-28 DIAGNOSIS — C8202 Follicular lymphoma grade I, intrathoracic lymph nodes: Secondary | ICD-10-CM

## 2023-12-28 DIAGNOSIS — T451X5A Adverse effect of antineoplastic and immunosuppressive drugs, initial encounter: Secondary | ICD-10-CM | POA: Diagnosis not present

## 2023-12-28 DIAGNOSIS — Z5112 Encounter for antineoplastic immunotherapy: Secondary | ICD-10-CM | POA: Diagnosis not present

## 2023-12-28 DIAGNOSIS — Z79620 Long term (current) use of immunosuppressive biologic: Secondary | ICD-10-CM | POA: Diagnosis not present

## 2023-12-28 DIAGNOSIS — R232 Flushing: Secondary | ICD-10-CM | POA: Diagnosis not present

## 2023-12-28 MED ORDER — SODIUM CHLORIDE 0.9 % IV SOLN
125.0000 mg/m2 | Freq: Once | INTRAVENOUS | Status: AC
  Administered 2023-12-28: 300 mg via INTRAVENOUS
  Filled 2023-12-28 (×2): qty 30

## 2023-12-28 MED ORDER — ACETAMINOPHEN 325 MG PO TABS
650.0000 mg | ORAL_TABLET | Freq: Once | ORAL | Status: AC
  Administered 2023-12-28: 650 mg via ORAL
  Filled 2023-12-28: qty 2

## 2023-12-28 MED ORDER — DIPHENHYDRAMINE HCL 50 MG/ML IJ SOLN
50.0000 mg | Freq: Once | INTRAMUSCULAR | Status: AC
  Administered 2023-12-28: 50 mg via INTRAVENOUS
  Filled 2023-12-28: qty 1

## 2023-12-28 MED ORDER — SODIUM CHLORIDE 0.9 % IV SOLN
20.0000 mg | Freq: Once | INTRAVENOUS | Status: AC
  Administered 2023-12-28: 20 mg via INTRAVENOUS
  Filled 2023-12-28: qty 20

## 2023-12-28 MED ORDER — HEPARIN SOD (PORK) LOCK FLUSH 100 UNIT/ML IV SOLN
500.0000 [IU] | Freq: Once | INTRAVENOUS | Status: AC | PRN
  Administered 2023-12-28: 500 [IU]
  Filled 2023-12-28: qty 5

## 2023-12-28 MED ORDER — SODIUM CHLORIDE 0.9 % IV SOLN
INTRAVENOUS | Status: DC
  Filled 2023-12-28: qty 250

## 2023-12-28 MED ORDER — MONTELUKAST SODIUM 10 MG PO TABS
10.0000 mg | ORAL_TABLET | Freq: Once | ORAL | Status: AC
  Administered 2023-12-28: 10 mg via ORAL
  Filled 2023-12-28: qty 1

## 2023-12-28 MED ORDER — SODIUM CHLORIDE 0.9 % IV SOLN
40.0000 mg | Freq: Once | INTRAVENOUS | Status: AC
  Administered 2023-12-28: 40 mg via INTRAVENOUS
  Filled 2023-12-28: qty 4

## 2023-12-28 MED ORDER — SODIUM CHLORIDE 0.9 % IV SOLN
100.0000 mg | Freq: Once | INTRAVENOUS | Status: AC
  Administered 2023-12-28: 100 mg via INTRAVENOUS
  Filled 2023-12-28: qty 10

## 2023-12-28 MED FILL — Dexamethasone Sodium Phosphate Inj 100 MG/10ML: INTRAMUSCULAR | Qty: 2 | Status: AC

## 2023-12-28 NOTE — Progress Notes (Signed)
Per Dr Smith Javion, ok to proceed w/ RTX 300 mg this day w/ administration rate outlined in order instructions (titration as tol).   Will not administer remaining volume (~34.5 mL = 36.9 mg) left from previous day.  Bag discarded in bulk waste bin in pharmacy.  Ebony Hail, Pharm.D., CPP 12/28/2023@5 :38 PM

## 2023-12-28 NOTE — Patient Instructions (Signed)
[facility]  Discharge Instructions: Thank you for choosing [facility] to provide your oncology and hematology care.  If you have a lab appointment with the Cancer Center, please go directly to the Cancer Center and check in at the registration area.  Wear comfortable clothing and clothing appropriate for easy access to any Portacath or PICC line.   We strive to give you quality time with your provider. You may need to reschedule your appointment if you arrive late (15 or more minutes).  Arriving late affects you and other patients whose appointments are after yours.  Also, if you miss three or more appointments without notifying the office, you may be dismissed from the clinic at the provider's discretion.      For prescription refill requests, have your pharmacy contact our office and allow 72 hours for refills to be completed.    Today you received the following chemotherapy and/or immunotherapy agents RITUXAN      To help prevent nausea and vomiting after your treatment, we encourage you to take your nausea medication as directed.  BELOW ARE SYMPTOMS THAT SHOULD BE REPORTED IMMEDIATELY: *FEVER GREATER THAN 100.4 F (38 C) OR HIGHER *CHILLS OR SWEATING *NAUSEA AND VOMITING THAT IS NOT CONTROLLED WITH YOUR NAUSEA MEDICATION *UNUSUAL SHORTNESS OF BREATH *UNUSUAL BRUISING OR BLEEDING *URINARY PROBLEMS (pain or burning when urinating, or frequent urination) *BOWEL PROBLEMS (unusual diarrhea, constipation, pain near the anus) TENDERNESS IN MOUTH AND THROAT WITH OR WITHOUT PRESENCE OF ULCERS (sore throat, sores in mouth, or a toothache) UNUSUAL RASH, SWELLING OR PAIN  UNUSUAL VAGINAL DISCHARGE OR ITCHING   Items with * indicate a potential emergency and should be followed up as soon as possible or go to the Emergency Department if any problems should occur.  Please show the CHEMOTHERAPY ALERT CARD or IMMUNOTHERAPY  ALERT CARD at check-in to the Emergency Department and triage nurse.  Should you have questions after your visit or need to cancel or reschedule your appointment, please contact [facility] - A DEPT OF [facility]  [phone] and follow the prompts.  Office hours are 8:00 a.m. to 4:30 p.m. Monday - Friday. Please note that voicemails left after 4:00 p.m. may not be returned until the following business day.  We are closed weekends and major holidays. You have access to a nurse at all times for urgent questions. Please call the main number to the clinic [phone] and follow the prompts.  For any non-urgent questions, you may also contact your provider using MyChart. We now offer e-Visits for anyone 71 and older to request care online for non-urgent symptoms. For details visit [email or web address].   Also download the MyChart app! Go to the app store, search "MyChart", open the app, select [place], and log in with your MyChart username and password.  Rituximab Injection What is this medication? RITUXIMAB (ri TUX i mab) treats leukemia and lymphoma. It works by blocking a protein that causes cancer cells to grow and multiply. This helps to slow or stop the spread of cancer cells. It may also be used to treat autoimmune conditions, such as arthritis. It works by slowing down an overactive immune system. It is a monoclonal antibody. This medicine may be used for other purposes; ask your health care provider or pharmacist if you have questions. COMMON BRAND NAME(S): RIABNI, Rituxan, RUXIENCE, truxima What should I tell my care  team before I take this medication? They need to know if you have any of these conditions: Chest pain Heart disease Immune system problems Infection, such as chickenpox, cold sores, hepatitis B, herpes Irregular heartbeat or rhythm Kidney disease Low blood counts, such as low white cells, platelets, red cells Lung disease Recent or  upcoming vaccine An unusual or allergic reaction to rituximab, other medications, foods, dyes, or preservatives Pregnant or trying to get pregnant Breast-feeding How should I use this medication? This medication is injected into a vein. It is given by a care team in a hospital or clinic setting. A special MedGuide will be given to you before each treatment. Be sure to read this information carefully each time. Talk to your care team about the use of this medication in children. While this medication may be prescribed for children as young as 6 months for selected conditions, precautions do apply. Overdosage: If you think you have taken too much of this medicine contact a poison control center or emergency room at once. NOTE: This medicine is only for you. Do not share this medicine with others. What if I miss a dose? Keep appointments for follow-up doses. It is important not to miss your dose. Call your care team if you are unable to keep an appointment. What may interact with this medication? Do not take this medication with any of the following: Live vaccines This medication may also interact with the following: Cisplatin This list may not describe all possible interactions. Give your health care provider a list of all the medicines, herbs, non-prescription drugs, or dietary supplements you use. Also tell them if you smoke, drink alcohol, or use illegal drugs. Some items may interact with your medicine. What should I watch for while using this medication? Your condition will be monitored carefully while you are receiving this medication. You may need blood work while taking this medication. This medication can cause serious infusion reactions. To reduce the risk your care team may give you other medications to take before receiving this one. Be sure to follow the directions from your care team. This medication may increase your risk of getting an infection. Call your care team for advice if  you get a fever, chills, sore throat, or other symptoms of a cold or flu. Do not treat yourself. Try to avoid being around people who are sick. Call your care team if you are around anyone with measles, chickenpox, or if you develop sores or blisters that do not heal properly. Avoid taking medications that contain aspirin, acetaminophen, ibuprofen, naproxen, or ketoprofen unless instructed by your care team. These medications may hide a fever. This medication may cause serious skin reactions. They can happen weeks to months after starting the medication. Contact your care team right away if you notice fevers or flu-like symptoms with a rash. The rash may be red or purple and then turn into blisters or peeling of the skin. You may also notice a red rash with swelling of the face, lips, or lymph nodes in your neck or under your arms. In some patients, this medication may cause a serious brain infection that may cause death. If you have any problems seeing, thinking, speaking, walking, or standing, tell your care team right away. If you cannot reach your care team, urgently seek another source of medical care. Talk to your care team if you may be pregnant. Serious birth defects can occur if you take this medication during pregnancy and for 12 months after the  last dose. You will need a negative pregnancy test before starting this medication. Contraception is recommended while taking this medication and for 12 months after the last dose. Your care team can help you find the option that works for you. Do not breastfeed while taking this medication and for at least 6 months after the last dose. What side effects may I notice from receiving this medication? Side effects that you should report to your care team as soon as possible: Allergic reactions or angioedema--skin rash, itching or hives, swelling of the face, eyes, lips, tongue, arms, or legs, trouble swallowing or breathing Bowel blockage--stomach cramping,  unable to have a bowel movement or pass gas, loss of appetite, vomiting Dizziness, loss of balance or coordination, confusion or trouble speaking Heart attack--pain or tightness in the chest, shoulders, arms, or jaw, nausea, shortness of breath, cold or clammy skin, feeling faint or lightheaded Heart rhythm changes--fast or irregular heartbeat, dizziness, feeling faint or lightheaded, chest pain, trouble breathing Infection--fever, chills, cough, sore throat, wounds that don't heal, pain or trouble when passing urine, general feeling of discomfort or being unwell Infusion reactions--chest pain, shortness of breath or trouble breathing, feeling faint or lightheaded Kidney injury--decrease in the amount of urine, swelling of the ankles, hands, or feet Liver injury--right upper belly pain, loss of appetite, nausea, light-colored stool, dark yellow or brown urine, yellowing skin or eyes, unusual weakness or fatigue Redness, blistering, peeling, or loosening of the skin, including inside the mouth Stomach pain that is severe, does not go away, or gets worse Tumor lysis syndrome (TLS)--nausea, vomiting, diarrhea, decrease in the amount of urine, dark urine, unusual weakness or fatigue, confusion, muscle pain or cramps, fast or irregular heartbeat, joint pain Side effects that usually do not require medical attention (report to your care team if they continue or are bothersome): Headache Joint pain Nausea Runny or stuffy nose Unusual weakness or fatigue This list may not describe all possible side effects. Call your doctor for medical advice about side effects. You may report side effects to FDA at 1-800-FDA-1088. Where should I keep my medication? This medication is given in a hospital or clinic. It will not be stored at home. NOTE: This sheet is a summary. It may not cover all possible information. If you have questions about this medicine, talk to your doctor, pharmacist, or health care provider.   2024 Elsevier/Gold Standard (2022-04-09 00:00:00)

## 2023-12-28 NOTE — Progress Notes (Signed)
Ok to increase Rituxan rate from 47 mL/hr (50 mg/hr) to 58.4 mL/hr (62.5 mg/hr) per Dr. Smith Bhavesh.  Continue monitoring as planned.  Ebony Hail, Pharm.D., CPP 12/28/2023@12 :49 PM

## 2023-12-29 ENCOUNTER — Inpatient Hospital Stay: Payer: Medicare HMO

## 2023-12-29 ENCOUNTER — Encounter: Payer: Self-pay | Admitting: Oncology

## 2023-12-29 VITALS — BP 144/88 | HR 87 | Temp 97.6°F | Resp 20

## 2023-12-29 DIAGNOSIS — T451X5A Adverse effect of antineoplastic and immunosuppressive drugs, initial encounter: Secondary | ICD-10-CM | POA: Diagnosis not present

## 2023-12-29 DIAGNOSIS — R232 Flushing: Secondary | ICD-10-CM | POA: Diagnosis not present

## 2023-12-29 DIAGNOSIS — Z79620 Long term (current) use of immunosuppressive biologic: Secondary | ICD-10-CM | POA: Diagnosis not present

## 2023-12-29 DIAGNOSIS — Z5112 Encounter for antineoplastic immunotherapy: Secondary | ICD-10-CM | POA: Diagnosis not present

## 2023-12-29 DIAGNOSIS — C8202 Follicular lymphoma grade I, intrathoracic lymph nodes: Secondary | ICD-10-CM

## 2023-12-29 MED ORDER — ACETAMINOPHEN 325 MG PO TABS
650.0000 mg | ORAL_TABLET | Freq: Once | ORAL | Status: AC
Start: 2023-12-29 — End: 2023-12-29
  Administered 2023-12-29: 650 mg via ORAL
  Filled 2023-12-29: qty 2

## 2023-12-29 MED ORDER — SODIUM CHLORIDE 0.9 % IV SOLN
INTRAVENOUS | Status: DC
  Filled 2023-12-29: qty 250

## 2023-12-29 MED ORDER — SODIUM CHLORIDE 0.9 % IV SOLN
20.0000 mg | Freq: Once | INTRAVENOUS | Status: AC
Start: 2023-12-29 — End: 2023-12-29
  Administered 2023-12-29: 20 mg via INTRAVENOUS
  Filled 2023-12-29: qty 20

## 2023-12-29 MED ORDER — DIPHENHYDRAMINE HCL 50 MG/ML IJ SOLN
50.0000 mg | Freq: Once | INTRAMUSCULAR | Status: AC
  Administered 2023-12-29: 50 mg via INTRAVENOUS
  Filled 2023-12-29: qty 1

## 2023-12-29 MED ORDER — MONTELUKAST SODIUM 10 MG PO TABS
10.0000 mg | ORAL_TABLET | Freq: Once | ORAL | Status: AC
  Administered 2023-12-29: 10 mg via ORAL
  Filled 2023-12-29: qty 1

## 2023-12-29 MED ORDER — SODIUM CHLORIDE 0.9 % IV SOLN
125.0000 mg/m2 | Freq: Once | INTRAVENOUS | Status: AC
  Administered 2023-12-29: 300 mg via INTRAVENOUS
  Filled 2023-12-29: qty 30

## 2023-12-29 MED ORDER — SODIUM CHLORIDE 0.9 % IV SOLN
40.0000 mg | Freq: Once | INTRAVENOUS | Status: AC
  Administered 2023-12-29: 40 mg via INTRAVENOUS
  Filled 2023-12-29: qty 4

## 2023-12-29 MED ORDER — HEPARIN SOD (PORK) LOCK FLUSH 100 UNIT/ML IV SOLN
500.0000 [IU] | Freq: Once | INTRAVENOUS | Status: AC | PRN
  Administered 2023-12-29: 500 [IU]
  Filled 2023-12-29: qty 5

## 2023-12-29 NOTE — Patient Instructions (Signed)
[facility]  Discharge Instructions: Thank you for choosing [facility] to provide your oncology and hematology care.  If you have a lab appointment with the Cancer Center, please go directly to the Cancer Center and check in at the registration area.  Wear comfortable clothing and clothing appropriate for easy access to any Portacath or PICC line.   We strive to give you quality time with your provider. You may need to reschedule your appointment if you arrive late (15 or more minutes).  Arriving late affects you and other patients whose appointments are after yours.  Also, if you miss three or more appointments without notifying the office, you may be dismissed from the clinic at the provider's discretion.      For prescription refill requests, have your pharmacy contact our office and allow 72 hours for refills to be completed.    Today you received the following chemotherapy and/or immunotherapy agents rituximab      To help prevent nausea and vomiting after your treatment, we encourage you to take your nausea medication as directed.  BELOW ARE SYMPTOMS THAT SHOULD BE REPORTED IMMEDIATELY: *FEVER GREATER THAN 100.4 F (38 C) OR HIGHER *CHILLS OR SWEATING *NAUSEA AND VOMITING THAT IS NOT CONTROLLED WITH YOUR NAUSEA MEDICATION *UNUSUAL SHORTNESS OF BREATH *UNUSUAL BRUISING OR BLEEDING *URINARY PROBLEMS (pain or burning when urinating, or frequent urination) *BOWEL PROBLEMS (unusual diarrhea, constipation, pain near the anus) TENDERNESS IN MOUTH AND THROAT WITH OR WITHOUT PRESENCE OF ULCERS (sore throat, sores in mouth, or a toothache) UNUSUAL RASH, SWELLING OR PAIN  UNUSUAL VAGINAL DISCHARGE OR ITCHING   Items with * indicate a potential emergency and should be followed up as soon as possible or go to the Emergency Department if any problems should occur.  Please show the CHEMOTHERAPY ALERT CARD or IMMUNOTHERAPY  ALERT CARD at check-in to the Emergency Department and triage nurse.  Should you have questions after your visit or need to cancel or reschedule your appointment, please contact [facility] - A DEPT OF [facility]  [phone] and follow the prompts.  Office hours are 8:00 a.m. to 4:30 p.m. Monday - Friday. Please note that voicemails left after 4:00 p.m. may not be returned until the following business day.  We are closed weekends and major holidays. You have access to a nurse at all times for urgent questions. Please call the main number to the clinic [phone] and follow the prompts.  For any non-urgent questions, you may also contact your provider using MyChart. We now offer e-Visits for anyone 87 and older to request care online for non-urgent symptoms. For details visit [email or web address].   Also download the MyChart app! Go to the app store, search "MyChart", open the app, select [place] [facility], and log in with your MyChart username and password.

## 2023-12-30 ENCOUNTER — Telehealth: Payer: Self-pay | Admitting: Cardiology

## 2023-12-30 NOTE — Telephone Encounter (Signed)
*  STAT* If patient is at the pharmacy, call can be transferred to refill team.   1. Which medications need to be refilled? (please list name of each medication and dose if known)   atorvastatin (LIPITOR) 80 MG tablet   2. Would you like to learn more about the convenience, safety, & potential cost savings by using the [facility]?   3. Are you open to using the [facility] (Type [facility]. ).  4. Which pharmacy/location (including street and city if local pharmacy) is medication to be sent to?  [name] - [place], [place] - [address]   5. Do they need a 30 day or 90 day supply?   90 day  Patient stated he completely out of this medication.

## 2023-12-30 NOTE — Telephone Encounter (Signed)
Please contact pt for future appointment. Pt must be seen yearly for continuous refill on medications or contact pcp.

## 2023-12-31 MED ORDER — ATORVASTATIN CALCIUM 80 MG PO TABS: 80.0000 mg | ORAL_TABLET | Freq: Every day | ORAL | 0 refills | Status: DC

## 2023-12-31 MED FILL — Dexamethasone Sodium Phosphate Inj 100 MG/10ML: INTRAMUSCULAR | Qty: 2 | Status: AC

## 2023-12-31 NOTE — Addendum Note (Signed)
Addended by: Kendrick Fries on: 12/31/2023 10:02 AM   Modules accepted: Orders

## 2024-01-03 ENCOUNTER — Ambulatory Visit: Payer: Medicare HMO | Admitting: Oncology

## 2024-01-03 ENCOUNTER — Other Ambulatory Visit: Payer: Self-pay | Admitting: Oncology

## 2024-01-03 ENCOUNTER — Inpatient Hospital Stay: Payer: Medicare HMO | Attending: Oncology

## 2024-01-03 ENCOUNTER — Inpatient Hospital Stay: Payer: Medicare HMO

## 2024-01-03 ENCOUNTER — Other Ambulatory Visit: Payer: Medicare HMO

## 2024-01-03 ENCOUNTER — Ambulatory Visit: Payer: Medicare HMO

## 2024-01-03 ENCOUNTER — Encounter: Payer: Self-pay | Admitting: Oncology

## 2024-01-03 VITALS — BP 137/84 | HR 91 | Temp 98.7°F | Resp 16

## 2024-01-03 DIAGNOSIS — Z87891 Personal history of nicotine dependence: Secondary | ICD-10-CM | POA: Insufficient documentation

## 2024-01-03 DIAGNOSIS — Z5112 Encounter for antineoplastic immunotherapy: Secondary | ICD-10-CM | POA: Diagnosis not present

## 2024-01-03 DIAGNOSIS — D6959 Other secondary thrombocytopenia: Secondary | ICD-10-CM | POA: Insufficient documentation

## 2024-01-03 DIAGNOSIS — T451X5A Adverse effect of antineoplastic and immunosuppressive drugs, initial encounter: Secondary | ICD-10-CM | POA: Diagnosis not present

## 2024-01-03 DIAGNOSIS — D72820 Lymphocytosis (symptomatic): Secondary | ICD-10-CM | POA: Insufficient documentation

## 2024-01-03 DIAGNOSIS — C8202 Follicular lymphoma grade I, intrathoracic lymph nodes: Secondary | ICD-10-CM | POA: Insufficient documentation

## 2024-01-03 MED ORDER — HEPARIN SOD (PORK) LOCK FLUSH 100 UNIT/ML IV SOLN
500.0000 [IU] | Freq: Once | INTRAVENOUS | Status: AC | PRN
  Administered 2024-01-03: 500 [IU]
  Filled 2024-01-03: qty 5

## 2024-01-03 MED ORDER — MONTELUKAST SODIUM 10 MG PO TABS
10.0000 mg | ORAL_TABLET | Freq: Once | ORAL | Status: AC
  Administered 2024-01-03: 10 mg via ORAL
  Filled 2024-01-03: qty 1

## 2024-01-03 MED ORDER — ACETAMINOPHEN 325 MG PO TABS
650.0000 mg | ORAL_TABLET | Freq: Once | ORAL | Status: AC
  Administered 2024-01-03: 650 mg via ORAL
  Filled 2024-01-03: qty 2

## 2024-01-03 MED ORDER — DIPHENHYDRAMINE HCL 50 MG/ML IJ SOLN
50.0000 mg | Freq: Once | INTRAMUSCULAR | Status: AC
  Administered 2024-01-03: 50 mg via INTRAVENOUS
  Filled 2024-01-03: qty 1

## 2024-01-03 MED ORDER — PALONOSETRON HCL INJECTION 0.25 MG/5ML
0.2500 mg | Freq: Once | INTRAVENOUS | Status: AC
Start: 2024-01-03 — End: 2024-01-03
  Administered 2024-01-03: 0.25 mg via INTRAVENOUS
  Filled 2024-01-03: qty 5

## 2024-01-03 MED ORDER — SODIUM CHLORIDE 0.9 % IV SOLN
20.0000 mg | Freq: Once | INTRAVENOUS | Status: AC
Start: 2024-01-03 — End: 2024-01-03
  Administered 2024-01-03: 20 mg via INTRAVENOUS
  Filled 2024-01-03: qty 20

## 2024-01-03 MED ORDER — SODIUM CHLORIDE 0.9 % IV SOLN
125.0000 mg/m2 | Freq: Once | INTRAVENOUS | Status: AC
  Administered 2024-01-03: 300 mg via INTRAVENOUS
  Filled 2024-01-03: qty 30

## 2024-01-03 MED ORDER — SODIUM CHLORIDE 0.9 % IV SOLN
40.0000 mg | Freq: Once | INTRAVENOUS | Status: AC
  Administered 2024-01-03: 40 mg via INTRAVENOUS
  Filled 2024-01-03: qty 4

## 2024-01-03 MED ORDER — SODIUM CHLORIDE 0.9 % IV SOLN
INTRAVENOUS | Status: DC
Start: 2024-01-03 — End: 2024-01-03
  Filled 2024-01-03: qty 250

## 2024-01-03 MED FILL — Dexamethasone Sodium Phosphate Inj 100 MG/10ML: INTRAMUSCULAR | Qty: 2 | Status: AC

## 2024-01-03 NOTE — Patient Instructions (Signed)
 [facility]  Discharge Instructions: Thank you for choosing [facility] to provide your oncology and hematology care.  If you have a lab appointment with the Cancer Center, please go directly to the Cancer Center and check in at the registration area.  Wear comfortable clothing and clothing appropriate for easy access to any Portacath or PICC line.   We strive to give you quality time with your provider. You may need to reschedule your appointment if you arrive late (15 or more minutes).  Arriving late affects you and other patients whose appointments are after yours.  Also, if you miss three or more appointments without notifying the office, you may be dismissed from the clinic at the provider's discretion.      For prescription refill requests, have your pharmacy contact our office and allow 72 hours for refills to be completed.    Today you received the following chemotherapy and/or immunotherapy agents rituximab      To help prevent nausea and vomiting after your treatment, we encourage you to take your nausea medication as directed.  BELOW ARE SYMPTOMS THAT SHOULD BE REPORTED IMMEDIATELY: *FEVER GREATER THAN 100.4 F (38 C) OR HIGHER *CHILLS OR SWEATING *NAUSEA AND VOMITING THAT IS NOT CONTROLLED WITH YOUR NAUSEA MEDICATION *UNUSUAL SHORTNESS OF BREATH *UNUSUAL BRUISING OR BLEEDING *URINARY PROBLEMS (pain or burning when urinating, or frequent urination) *BOWEL PROBLEMS (unusual diarrhea, constipation, pain near the anus) TENDERNESS IN MOUTH AND THROAT WITH OR WITHOUT PRESENCE OF ULCERS (sore throat, sores in mouth, or a toothache) UNUSUAL RASH, SWELLING OR PAIN  UNUSUAL VAGINAL DISCHARGE OR ITCHING   Items with * indicate a potential emergency and should be followed up as soon as possible or go to the Emergency Department if any problems should occur.  Please show the CHEMOTHERAPY ALERT CARD or IMMUNOTHERAPY  ALERT CARD at check-in to the Emergency Department and triage nurse.  Should you have questions after your visit or need to cancel or reschedule your appointment, please contact [facility] - A DEPT OF [facility]  [phone] and follow the prompts.  Office hours are 8:00 a.m. to 4:30 p.m. Monday - Friday. Please note that voicemails left after 4:00 p.m. may not be returned until the following business day.  We are closed weekends and major holidays. You have access to a nurse at all times for urgent questions. Please call the main number to the clinic [phone] and follow the prompts.  For any non-urgent questions, you may also contact your provider using MyChart. We now offer e-Visits for anyone 87 and older to request care online for non-urgent symptoms. For details visit [email or web address].   Also download the MyChart app! Go to the app store, search "MyChart", open the app, select [facility], and log in with your MyChart username and password.

## 2024-01-04 ENCOUNTER — Ambulatory Visit: Payer: Medicare HMO

## 2024-01-04 ENCOUNTER — Inpatient Hospital Stay: Payer: Medicare HMO

## 2024-01-04 VITALS — BP 148/86 | HR 84 | Temp 97.2°F | Resp 18

## 2024-01-04 DIAGNOSIS — C8202 Follicular lymphoma grade I, intrathoracic lymph nodes: Secondary | ICD-10-CM

## 2024-01-04 DIAGNOSIS — Z5112 Encounter for antineoplastic immunotherapy: Secondary | ICD-10-CM | POA: Diagnosis not present

## 2024-01-04 DIAGNOSIS — T451X5A Adverse effect of antineoplastic and immunosuppressive drugs, initial encounter: Secondary | ICD-10-CM | POA: Diagnosis not present

## 2024-01-04 DIAGNOSIS — D6959 Other secondary thrombocytopenia: Secondary | ICD-10-CM | POA: Diagnosis not present

## 2024-01-04 DIAGNOSIS — D72820 Lymphocytosis (symptomatic): Secondary | ICD-10-CM | POA: Diagnosis not present

## 2024-01-04 DIAGNOSIS — Z87891 Personal history of nicotine dependence: Secondary | ICD-10-CM | POA: Diagnosis not present

## 2024-01-04 MED ORDER — ALTEPLASE 2 MG IJ SOLR
2.0000 mg | Freq: Once | INTRAMUSCULAR | Status: AC | PRN
  Administered 2024-01-04: 2 mg
  Filled 2024-01-04: qty 2

## 2024-01-04 MED ORDER — SODIUM CHLORIDE 0.9 % IV SOLN
40.0000 mg | Freq: Once | INTRAVENOUS | Status: AC
  Administered 2024-01-04: 40 mg via INTRAVENOUS
  Filled 2024-01-04: qty 4

## 2024-01-04 MED ORDER — SODIUM CHLORIDE 0.9 % IV SOLN
INTRAVENOUS | Status: DC
Start: 2024-01-04 — End: 2024-01-04
  Filled 2024-01-04: qty 250

## 2024-01-04 MED ORDER — DEXAMETHASONE SODIUM PHOSPHATE 100 MG/10ML IJ SOLN
20.0000 mg | Freq: Once | INTRAMUSCULAR | Status: AC
  Administered 2024-01-04: 20 mg via INTRAVENOUS
  Filled 2024-01-04: qty 20

## 2024-01-04 MED ORDER — MONTELUKAST SODIUM 10 MG PO TABS
10.0000 mg | ORAL_TABLET | Freq: Once | ORAL | Status: AC
Start: 2024-01-04 — End: 2024-01-04
  Administered 2024-01-04: 10 mg via ORAL
  Filled 2024-01-04: qty 1

## 2024-01-04 MED ORDER — SODIUM CHLORIDE 0.9 % IV SOLN
125.0000 mg/m2 | Freq: Once | INTRAVENOUS | Status: AC
  Administered 2024-01-04: 300 mg via INTRAVENOUS
  Filled 2024-01-04 (×2): qty 30

## 2024-01-04 MED ORDER — HEPARIN SOD (PORK) LOCK FLUSH 100 UNIT/ML IV SOLN
500.0000 [IU] | Freq: Once | INTRAVENOUS | Status: AC | PRN
  Administered 2024-01-04: 500 [IU]
  Filled 2024-01-04: qty 5

## 2024-01-04 MED ORDER — ACETAMINOPHEN 325 MG PO TABS
650.0000 mg | ORAL_TABLET | Freq: Once | ORAL | Status: AC
Start: 2024-01-04 — End: 2024-01-04
  Administered 2024-01-04: 650 mg via ORAL
  Filled 2024-01-04: qty 2

## 2024-01-04 MED ORDER — DIPHENHYDRAMINE HCL 50 MG/ML IJ SOLN
50.0000 mg | Freq: Once | INTRAMUSCULAR | Status: AC
Start: 2024-01-04 — End: 2024-01-04
  Administered 2024-01-04: 50 mg via INTRAVENOUS
  Filled 2024-01-04: qty 1

## 2024-01-04 MED FILL — Famotidine Inj 200 MG/20ML: INTRAVENOUS | Qty: 4 | Status: AC

## 2024-01-04 MED FILL — Dexamethasone Sodium Phosphate Inj 100 MG/10ML: INTRAMUSCULAR | Qty: 2 | Status: AC

## 2024-01-04 NOTE — Progress Notes (Signed)
Use 12/27/2023 labs for all infusions this week per Dr Smith Xzayvier

## 2024-01-04 NOTE — Patient Instructions (Signed)
[facility] - A DEPT OF [facility]  Discharge Instructions: Thank you for choosing [facility] to provide your oncology and hematology care.  If you have a lab appointment with the Cancer Center, please go directly to the Cancer Center and check in at the registration area.  Wear comfortable clothing and clothing appropriate for easy access to any Portacath or PICC line.   We strive to give you quality time with your provider. You may need to reschedule your appointment if you arrive late (15 or more minutes).  Arriving late affects you and other patients whose appointments are after yours.  Also, if you miss three or more appointments without notifying the office, you may be dismissed from the clinic at the provider's discretion.      For prescription refill requests, have your pharmacy contact our office and allow 72 hours for refills to be completed.    Today you received the following chemotherapy and/or immunotherapy agents RITUXAN      To help prevent nausea and vomiting after your treatment, we encourage you to take your nausea medication as directed.  BELOW ARE SYMPTOMS THAT SHOULD BE REPORTED IMMEDIATELY: *FEVER GREATER THAN 100.4 F (38 C) OR HIGHER *CHILLS OR SWEATING *NAUSEA AND VOMITING THAT IS NOT CONTROLLED WITH YOUR NAUSEA MEDICATION *UNUSUAL SHORTNESS OF BREATH *UNUSUAL BRUISING OR BLEEDING *URINARY PROBLEMS (pain or burning when urinating, or frequent urination) *BOWEL PROBLEMS (unusual diarrhea, constipation, pain near the anus) TENDERNESS IN MOUTH AND THROAT WITH OR WITHOUT PRESENCE OF ULCERS (sore throat, sores in mouth, or a toothache) UNUSUAL RASH, SWELLING OR PAIN  UNUSUAL VAGINAL DISCHARGE OR ITCHING   Items with * indicate a potential emergency and should be followed up as soon as possible or go to the Emergency Department if any problems should occur.  Please show the CHEMOTHERAPY ALERT CARD or IMMUNOTHERAPY  ALERT CARD at check-in to the Emergency Department and triage nurse.  Should you have questions after your visit or need to cancel or reschedule your appointment, please contact [facility] - A DEPT OF [facility]  [phone] and follow the prompts.  Office hours are 8:00 a.m. to 4:30 p.m. Monday - Friday. Please note that voicemails left after 4:00 p.m. may not be returned until the following business day.  We are closed weekends and major holidays. You have access to a nurse at all times for urgent questions. Please call the main number to the clinic [phone] and follow the prompts.  For any non-urgent questions, you may also contact your provider using MyChart. We now offer e-Visits for anyone 65 and older to request care online for non-urgent symptoms. For details visit [email or web address].   Also download the MyChart app! Go to the app store, search "MyChart", open the app, select , and log in with your MyChart username and password.  Rituximab Injection Quel est ce mdicament ? Le RITUXIMAB traite la leucmie et le lymphome. ll agit en bloquant une protine responsable de la croissance et de la multiplication des cellules cancreuses. Cela permet de ralentir ou d'arrter la propagation des cellules cancreuses. Il peut galement tre utilis pour traiter des maladies auto-immunes, telles que l'arthrite. Il agit en diminuant l'hyperactivit du systme immunitaire. Il s'agit d'un anticorps monoclonal. Ce mdicament peut tre utilis pour d'autres indications ; adressez-vous  votre mdecin ou  votre pharmacien si vous [employer] questions. NOM(S) DE MARQUE COURANT(S) : RIABNI, Rituxan,  RUXIENCE, truxima Que dois-je dire  mon fournisseur de [employer] de prendre ce mdicament ? Votre quipe de soins a besoin de savoir si vous tes dans l?une des situations suivantes : Douleur thoracique [place] cardiaque Troubles du systme  immunitaire Infection, telle que la varicelle, les boutons de fivre, l'hpatite B et l'herps Rythme ou battements cardiaques irrguliers [place]ie rnale Faibles numrations sanguines, notamment celles des globules blancs, des plaquettes et des globules rouges [place] pulmonaire Vaccination rcente ou programme Raction inhabituelle ou allergique au rituximab,  d?autres mdicaments,  des aliments,  des colorants ou  des conservateurs [place] ou projet de grossesse Allaitement Comment dois-je utiliser ce mdicament ? Ce mdicament est destin  tre inject dans une veine. Il est administr par [employer] quipe de soins dans un tablissement [employer] ou un Biochemist, clinical. Un guide spcial (MedGuide) vous sera fourni avant chaque traitement. [place]on Gully  lire attentivement [employer]. Pour toute information relative  l'utilisation de ce mdicament [employer], consultez votre quipe de [place]. Bien que ce mdicament puisse tre prescrit, pour certaines affections,  des enfants gs de 6 mois seulement, des prcautions s?imposent. Surdosage : si vous pensez avoir pris ce mdicament en excs, contactez immdiatement un centre [employer].<br />REMARQUE : ce mdicament vous est uniquement destin. Ne partagez pas ce Arts administrator. Que faire si je saute une dose ? Prsentez-vous  tous les rendez-vous pour recevoir les doses suivantes. Il est important de ne pas omettre votre dose. Appelez votre quipe de soins si vous ne pouvez pas Barrister's clerk. Quelles sont les interactions possibles avec ce mdicament ? Ne prenez pas ce [employer] substances suivantes : Vaccins vivants Ce mdicament peut galement interagir avec les substances suivantes : Cisplatine Cette liste peut ne pas dcrire toutes les interactions mdicamenteuses possibles. Donnez  votre fournisseur de [employer] de tous les mdicaments,  plantes mdicinales, mdicaments en vente libre ou complments alimentaires que vous prenez. Dites-lui aussi si vous fumez, buvez de l'alcool ou consommez des drogues illicites. Certaines substances peuvent interagir Writer. Que dois-je IT consultant par ce mdicament ? Votre tat de sant sera surveill attentivement durant le traitement par [facility]. Vous devrez Geographical information systems officer des analyses de sang Art therapist par [employer]. Ce mdicament peut provoquer des ractions graves lies  la perfusion. Pour rduire [employer], votre quipe de soins peut vous donner d?autres mdicaments  prendre avant de recevoir celui-ci. Veillez  suivre les instructions fournies par votre quipe de soins. Ce mdicament peut accrotre votre risque de contracter une infection. Demandez conseil  votre quipe de soins si vous avez de la fivre, des frissons, un mal de gorge ou d'autres symptmes de rhume ou de grippe. Ne pratiquez pas l?automdication. vitez de vous approcher des [employer]. Contactez votre quipe de soins si vous ctoyez une personne qui a la rougeole ou la varicelle ou si vous dveloppez des plaies ou des cloques qui gurissent mal. vitez de prendre des mdicaments contenant de l'aspirine, du paractamol (acetaminophen), de l'ibuprofne, du naproxne ou du ktoprofne, sauf indication contraire de votre quipe de [place]. Ces mdicaments peuvent masquer une fivre. Ce mdicament peut provoquer des ractions cutanes graves. Elles peuvent survenir plusieurs semaines ou mois aprs le dbut du [place]. Si vous avez de la fivre ou des symptmes pseudo-grippaux accompagns d?une ruption cutane, Clinical cytogeneticist immdiatement votre quipe de soins. L?ruption cutane peut se manifester par [employer] ou  violace, avec apparition de cloques ou desquamation (peau qui ple). Vous pouvez galement remarquer une ruption cutane rouge  accompagne d'un gonflement du visage, des lvres ou des ganglions lymphatiques au niveau du cou ou des [place]. Chez certains patients, ce mdicament peut tre responsable d?une infection crbrale grave susceptible d?entraner la mort. Si vous avez le moindre problme pour voir, rflchir, Engineer, building services, Therapist, music ou rester [employer], avertissez immdiatement votre quipe de [place]. Si vous ne pouvez pas joindre votre quipe de [place], [place] de toute urgence un autre fournisseur de Conservator, museum/gallery. Informez votre quipe de soins si vous pourriez tre enceinte. Des anomalies congnitales graves peuvent survenir si vous prenez ce mdicament pendant la grossesse et au cours des 12 mois qui suivent la prise de la dernire dose. Vous devrez effectuer un test de grossesse et avoir un rsultat ngatif avant de Marketing executive par [facility]. Il est recommand d?utiliser une mthode de contraception Warehouse manager par ce mdicament et au cours des 12 mois qui suivent la dernire dose. Votre quipe de Surveyor, quantity  trouver l?option Writer. N?allaitez pas Art therapist par ce mdicament ni pendant 6 mois au moins aprs la prise de la dernire dose. Quels effets secondaires puis-je remarquer en prenant ce mdicament ? Effets secondaires que vous devez signaler  votre quipe de soins ds que possible : Ractions allergiques ou odme de [place] : ruption cutane, dmangeaisons ou urticaire, gonflement du visage, des yeux, des lvres, de la [place], des bras ou des [place], Environmental consultant ou  respirer Occlusion intestinale : crampes d'estomac, incapacit  vacuer les selles ou les gaz intestinaux, perte d'apptit, vomissements Vertiges, perte d'quilibre ou de coordination, confusion ou difficults d'locution Crise cardiaque : douleur ou oppression dans la poitrine, les paules, les bras ou la mchoire, nauses, essoufflements, peau froide ou moite, sensation  d'vanouissement ou d'tourdissement Modifications du rythme cardiaque : battements cardiaques rapides ou irrguliers, vertiges, sensation d?vanouissement ou d?tourdissement, douleurs dans la poitrine, difficults  respirer Infection : fivre, frissons, toux, mal de gorge, plaies qui ne cicatrisent pas, douleurs ou difficults pour uriner, sensation d?inconfort ou malaise gnral Ractions  la perfusion : douleurs dans la poitrine, essoufflements ou difficults  respirer, sensation d'vanouissement ou d'tourdissement Atteinte des reins : miction (mission d'urine) rduite, gonflement des [place], des mains ou des pieds Atteinte du foie : douleurs dans la partie suprieure droite de l'abdomen, perte d'apptit, nauses, selles de couleur claire, urine jaune fonc ou marron, jaunissement de la peau ou des yeux, faiblesse ou fatigue inhabituelle Rougeur, cloques, desquamation (peau qui ple) ou relchement de la peau, y compris  l'intrieur de la bouche Douleur d'estomac svre, persistante ou qui s'aggrave Syndrome de lyse tumorale (SLT) : nauses, vomissements, diarrhe, diminution de la quantit d'urine, urine fonce, faiblesse ou fatigue inhabituelle, confusion, douleurs ou crampes musculaires, battements cardiaques rapides ou irrguliers, douleur articulaire Effets secondaires ne ncessitant gnralement pas un avis mdical (signalez-les  votre quipe de soins s'ils persistent ou sont gnants) : Mal de tte Douleurs articulaires Nauses Nez qui coule ou bouch Faiblesse ou fatigue inhabituelle Cette liste peut ne pas dcrire tous les effets secondaires possibles. Appelez votre mdecin pour [employer] sujet des [employer]. Vous pouvez signaler les effets secondaires  la FDA, au [phone]. O dois-je conserver mon mdicament ? Ce mdicament est administr dans un tablissement [employer]. Il ne sera pas conserv  domicile. REMARQUE : cette  notice est un rsum. Elle peut ne  pas contenir toutes les informations possibles. Si vous avez des questions  propos de ce mdicament, Patent attorney mdecin, votre pharmacien ou votre fournisseur de [place].  2024 Elsevier/Gold Standard (2022-10-07 00:00:00)

## 2024-01-05 ENCOUNTER — Other Ambulatory Visit: Payer: Self-pay | Admitting: Oncology

## 2024-01-05 ENCOUNTER — Inpatient Hospital Stay: Payer: Medicare HMO

## 2024-01-05 ENCOUNTER — Other Ambulatory Visit: Payer: Self-pay

## 2024-01-05 ENCOUNTER — Ambulatory Visit: Payer: Medicare HMO

## 2024-01-05 VITALS — BP 114/54 | HR 89 | Temp 97.2°F | Resp 18

## 2024-01-05 DIAGNOSIS — C8202 Follicular lymphoma grade I, intrathoracic lymph nodes: Secondary | ICD-10-CM

## 2024-01-05 DIAGNOSIS — D72820 Lymphocytosis (symptomatic): Secondary | ICD-10-CM | POA: Diagnosis not present

## 2024-01-05 DIAGNOSIS — Z5112 Encounter for antineoplastic immunotherapy: Secondary | ICD-10-CM | POA: Diagnosis not present

## 2024-01-05 DIAGNOSIS — Z87891 Personal history of nicotine dependence: Secondary | ICD-10-CM | POA: Diagnosis not present

## 2024-01-05 DIAGNOSIS — D6959 Other secondary thrombocytopenia: Secondary | ICD-10-CM | POA: Diagnosis not present

## 2024-01-05 DIAGNOSIS — T451X5A Adverse effect of antineoplastic and immunosuppressive drugs, initial encounter: Secondary | ICD-10-CM | POA: Diagnosis not present

## 2024-01-05 MED ORDER — MONTELUKAST SODIUM 10 MG PO TABS
10.0000 mg | ORAL_TABLET | Freq: Once | ORAL | Status: AC
  Administered 2024-01-05: 10 mg via ORAL
  Filled 2024-01-05: qty 1

## 2024-01-05 MED ORDER — SODIUM CHLORIDE 0.9 % IV SOLN
40.0000 mg | Freq: Once | INTRAVENOUS | Status: AC
  Administered 2024-01-05: 40 mg via INTRAVENOUS
  Filled 2024-01-05: qty 4

## 2024-01-05 MED ORDER — SODIUM CHLORIDE 0.9 % IV SOLN
INTRAVENOUS | Status: DC
  Filled 2024-01-05: qty 250

## 2024-01-05 MED ORDER — ACETAMINOPHEN 325 MG PO TABS
650.0000 mg | ORAL_TABLET | Freq: Once | ORAL | Status: AC
  Administered 2024-01-05: 650 mg via ORAL
  Filled 2024-01-05: qty 2

## 2024-01-05 MED ORDER — SODIUM CHLORIDE 0.9 % IV SOLN
20.0000 mg | Freq: Once | INTRAVENOUS | Status: AC
Start: 2024-01-05 — End: 2024-01-05
  Administered 2024-01-05: 20 mg via INTRAVENOUS
  Filled 2024-01-05: qty 20

## 2024-01-05 MED ORDER — DIPHENHYDRAMINE HCL 50 MG/ML IJ SOLN
50.0000 mg | Freq: Once | INTRAMUSCULAR | Status: AC
  Administered 2024-01-05: 50 mg via INTRAVENOUS
  Filled 2024-01-05: qty 1

## 2024-01-05 MED ORDER — SODIUM CHLORIDE 0.9 % IV SOLN
125.0000 mg/m2 | Freq: Once | INTRAVENOUS | Status: AC
  Administered 2024-01-05: 300 mg via INTRAVENOUS
  Filled 2024-01-05: qty 30

## 2024-01-05 NOTE — Patient Instructions (Signed)
 [facility] - A DEPT OF [facility]  Discharge Instructions: Thank you for choosing [facility] to provide your oncology and hematology care.  If you have a lab appointment with the Cancer Center, please go directly to the Cancer Center and check in at the registration area.  Wear comfortable clothing and clothing appropriate for easy access to any Portacath or PICC line.   We strive to give you quality time with your provider. You may need to reschedule your appointment if you arrive late (15 or more minutes).  Arriving late affects you and other patients whose appointments are after yours.  Also, if you miss three or more appointments without notifying the office, you may be dismissed from the clinic at the provider's discretion.      For prescription refill requests, have your pharmacy contact our office and allow 72 hours for refills to be completed.    Today you received the following chemotherapy and/or immunotherapy agents Ruxience       To help prevent nausea and vomiting after your treatment, we encourage you to take your nausea medication as directed.  BELOW ARE SYMPTOMS THAT SHOULD BE REPORTED IMMEDIATELY: *FEVER GREATER THAN 100.4 F (38 C) OR HIGHER *CHILLS OR SWEATING *NAUSEA AND VOMITING THAT IS NOT CONTROLLED WITH YOUR NAUSEA MEDICATION *UNUSUAL SHORTNESS OF BREATH *UNUSUAL BRUISING OR BLEEDING *URINARY PROBLEMS (pain or burning when urinating, or frequent urination) *BOWEL PROBLEMS (unusual diarrhea, constipation, pain near the anus) TENDERNESS IN MOUTH AND THROAT WITH OR WITHOUT PRESENCE OF ULCERS (sore throat, sores in mouth, or a toothache) UNUSUAL RASH, SWELLING OR PAIN  UNUSUAL VAGINAL DISCHARGE OR ITCHING   Items with * indicate a potential emergency and should be followed up as soon as possible or go to the Emergency Department if any problems should occur.  Please show the CHEMOTHERAPY ALERT CARD or IMMUNOTHERAPY  ALERT CARD at check-in to the Emergency Department and triage nurse.  Should you have questions after your visit or need to cancel or reschedule your appointment, please contact [facility] - A DEPT OF [facility]  [phone] and follow the prompts.  Office hours are 8:00 a.m. to 4:30 p.m. Monday - Friday. Please note that voicemails left after 4:00 p.m. may not be returned until the following business day.  We are closed weekends and major holidays. You have access to a nurse at all times for urgent questions. Please call the main number to the clinic [phone] and follow the prompts.  For any non-urgent questions, you may also contact your provider using MyChart. We now offer e-Visits for anyone 19 and older to request care online for non-urgent symptoms. For details visit [email or web address].   Also download the MyChart app! Go to the app store, search "MyChart", open the app, select , and log in with your MyChart username and password.

## 2024-01-06 ENCOUNTER — Encounter: Payer: Self-pay | Admitting: Cardiology

## 2024-01-06 ENCOUNTER — Ambulatory Visit: Payer: Medicare HMO | Attending: Cardiology | Admitting: Cardiology

## 2024-01-06 VITALS — BP 118/62 | HR 99 | Ht 74.0 in | Wt 241.0 lb

## 2024-01-06 DIAGNOSIS — I4711 Inappropriate sinus tachycardia, so stated: Secondary | ICD-10-CM

## 2024-01-06 DIAGNOSIS — E782 Mixed hyperlipidemia: Secondary | ICD-10-CM

## 2024-01-06 DIAGNOSIS — I10 Essential (primary) hypertension: Secondary | ICD-10-CM

## 2024-01-06 MED ORDER — ATORVASTATIN CALCIUM 80 MG PO TABS: 80.0000 mg | ORAL_TABLET | Freq: Every day | ORAL | 3 refills | Status: DC

## 2024-01-06 NOTE — Progress Notes (Signed)
 Cardiology Office Note:    Date:  01/06/2024   ID:  Russell Simpson, DOB June 21, 1961, MRN [id number]  PCP:  Melvin Pao, NP   [facility] Providers Cardiologist:  Redell Cave, MD     Referring MD: Melvin Pao, NP   Chief Complaint  Patient presents with   Follow-up    Patient denies new or acute cardiac problems/concerns today.      History of Present Illness:    Russell Simpson is a 63 y.o. male with a hx of hypertension, hyperlipidemia, asthma, former smoker x20+ years, non-Hodgkin's lymphoma lymphoma, inappropriate sinus tach who presents for follow-up.  Doing okay from a cardiac perspective.  Follow-up with oncologist revealed return of lymphoma.  Currently on chemo.  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  Denies palpitations.  BP adequately controlled on current medications including Toprol -XL and Norvasc .   Prior notes Echo 07/2020 EF 60 to 65%, impaired relaxation. Cardiac monitor 08/2021.  No significant arrhythmias.  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HCC)    Arthritis    Asthma    Basal cell carcinoma 01/28/2021   R upper arm, EDC 03/04/2021   Basal cell carcinoma 12/31/2022   Right temporal hair line. Nodular. 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   left upper back ED&C done   Hypertension     Hyponatremia    Inappropriate sinus tachycardia (HCC)    Non Hodgkin's lymphoma (HCC)    Pneumonia    RSV (respiratory syncytial virus infection)    SCC (squamous cell carcinoma) 08/28/2021   upper chest right of midline, EDC done 09/17/2021   SCC (squamous cell carcinoma) 09/30/2022   left chest  ED&C done   Sleep apnea    does not wear CPAP ; uses humidifier instead   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   right upper back, ED&C 06/18/2022   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   right upper back ED&C done   Squamous cell carcinoma of skin 02/19/2021   L inferior mandible, treated with EDC   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/28/2021   R ant shoulder - ED&C   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/28/2021   L upper abdomen - ED&C    Past Surgical History:  Procedure Laterality Date   ANTERIOR CERVICAL DECOMP/DISCECTOMY FUSION N/A 06/17/2016   Procedure: ANTERIOR CERVICAL DECOMPRESSION FUSION, CERVICAL 3-4, CERVICAL 4-5 WITH INSTRUMENTATION AND ALLOGRAFT;  Surgeon: Oneil Priestly, MD;  Location: [facility] OR;  Service: Orthopedics;  Laterality: N/A;  ANTERIOR CERVICAL DECOMPRESSION FUSION, CERVICAL 3-4, CERVICAL 4-5 WITH INSTRUMENTATION AND ALLOGRAFT   BACK SURGERY     x3   COLONOSCOPY WITH PROPOFOL  N/A 12/10/2023   Procedure: COLONOSCOPY WITH PROPOFOL ;  Surgeon: Unk Cotton  Jess, MD;  Location: [facility] ENDOSCOPY;  Service: Gastroenterology;  Laterality: N/A;   HARDWARE REMOVAL Right 05/04/2023   Procedure: HARDWARE REMOVAL;  Surgeon: Marchia Drivers, MD;  Location: [facility] ORS;  Service: Orthopedics;  Laterality: Right;   HEMOSTASIS CLIP PLACEMENT  12/10/2023   Procedure: HEMOSTASIS CLIP PLACEMENT;  Surgeon: Unk Corinn Jess, MD;  Location: [facility] ENDOSCOPY;  Service: Gastroenterology;;   HEMOSTASIS CONTROL  12/10/2023   Procedure: HEMOSTASIS CONTROL;  Surgeon: Unk Corinn Jess, MD;  Location: [facility] ENDOSCOPY;  Service: Gastroenterology;;   NECK SURGERY   12/01/2007   ORIF ANKLE FRACTURE Right 12/02/2022   Procedure: OPEN REDUCTION INTERNAL FIXATION (ORIF) ANKLE FRACTURE;  Surgeon: Marchia Drivers, MD;  Location: [facility] ORS;  Service: Orthopedics;  Laterality: Right;   ORIF ANKLE FRACTURE Right 12/01/2022   Procedure: OPEN REDUCTION INTERNAL FIXATION (ORIF) ANKLE FRACTURE;  Surgeon: Marchia Drivers, MD;  Location: [facility] ORS;  Service: Orthopedics;  Laterality: Right;   POLYPECTOMY  12/10/2023   Procedure: POLYPECTOMY;  Surgeon: Unk Corinn Jess, MD;  Location: [facility] ENDOSCOPY;  Service: Gastroenterology;;   PORTA CATH INSERTION N/A 04/03/2021   Procedure: PORTA CATH INSERTION;  Surgeon: Marea Selinda RAMAN, MD;  Location: [facility] INVASIVE CV LAB;  Service: Cardiovascular;  Laterality: N/A;   SUBMUCOSAL LIFTING INJECTION  12/10/2023   Procedure: SUBMUCOSAL LIFTING INJECTION;  Surgeon: Unk Corinn Jess, MD;  Location: [facility] ENDOSCOPY;  Service: Gastroenterology;;   SUBMUCOSAL TATTOO INJECTION  12/10/2023   Procedure: SUBMUCOSAL TATTOO INJECTION;  Surgeon: Unk Corinn Jess, MD;  Location: [facility] ENDOSCOPY;  Service: Gastroenterology;;   VIDEO BRONCHOSCOPY WITH ENDOBRONCHIAL ULTRASOUND Bilateral 10/14/2022   Procedure: VIDEO BRONCHOSCOPY WITH ENDOBRONCHIAL ULTRASOUND;  Surgeon: Tamea Dedra CROME, MD;  Location: [facility] ORS;  Service: Pulmonary;  Laterality: Bilateral;    Current Medications: Current Meds  Medication Sig   acetaminophen  (TYLENOL ) 325 MG tablet Take 2 tablets (650 mg) by mouth starting 2 days prior to chemo then 3 days after chemotherapy. Do not take on day of chemotherapy   albuterol  (VENTOLIN  HFA) 108 (90 Base) MCG/ACT inhaler Inhale 2 puffs into the lungs every 6 (six) hours as needed for wheezing or shortness of breath.   amLODipine  (NORVASC ) 2.5 MG tablet TAKE 1 TABLET BY MOUTH ONCE DAILY   Blood Glucose Monitoring Suppl (ONE TOUCH ULTRA 2) w/Device KIT 1 each by Does not apply route daily.   buPROPion  (WELLBUTRIN  XL) 300 MG 24 hr tablet Take  300 mg by mouth daily.   cetirizine  (ZYRTEC ) 10 MG tablet Take 1 tablet (10 mg total) by mouth daily.   clonazePAM  (KLONOPIN ) 0.5 MG tablet Take 0.5 mg by mouth every evening.   diphenhydrAMINE  (BENADRYL ) 25 mg capsule Starting 2 days prior to your chemotherapy infusions, take 1 pill (25 mg) by mouth daily. Do not take on day of chemotherapy.   DULoxetine  (CYMBALTA ) 60 MG capsule Take 60 mg by mouth every evening.   famotidine  (PEPCID ) 20 MG tablet Take 1 tablet (20 mg total) by mouth 2 (two) times daily.   famotidine  (PEPCID ) 20 MG tablet Starting 2 days prior to your chemotherapy infusions, take 1 pill (20 mg) by mouth daily. Do not take on day of chemotherapy.   finasteride  (PROSCAR ) 5 MG tablet TAKE 1 TABLET BY MOUTH ONCE DAILY   fluticasone  (FLONASE ) 50 MCG/ACT nasal spray Place 2 sprays into both nostrils daily.   glucose blood (ONETOUCH ULTRA) test strip 1 each by Other route daily. Use as instructed   Lancets (ONETOUCH ULTRASOFT) lancets 1 each by Other route daily. Use  as instructed   lenalidomide  (REVLIMID ) 15 MG capsule Take 1 capsule (15 mg total) by mouth daily. Take for 21 days, then hold for 7 days. Repeat every 28 days.   metoprolol  succinate (TOPROL -XL) 100 MG 24 hr tablet TAKE 1 TABLET BY MOUTH ONCE DAILY WITH OR IMMEDIATELY FOLLOWING A MEAL   mirtazapine  (REMERON ) 15 MG tablet Take 15 mg by mouth at bedtime.   montelukast  (SINGULAIR ) 10 MG tablet TAKE 1 TABLET EVERY DAY   montelukast  (SINGULAIR ) 10 MG tablet Starting 2 days prior to your chemotherapy infusions, take 1 pill (10 mg) by mouth at night. Do not take on day of chemotherapy.   naltrexone  (DEPADE) 50 MG tablet Take 50 mg by mouth daily.   OLANZapine  (ZYPREXA ) 5 MG tablet Take 5 mg by mouth at bedtime.   ondansetron  (ZOFRAN ) 4 MG tablet Take 1 tablet (4 mg total) by mouth every 8 (eight) hours as needed for nausea or vomiting.   predniSONE  (DELTASONE ) 50 MG tablet Take 1 tablet (50 mg) by mouth starting 2 days prior to  chemo then 3 days after chemotherapy. Do not take on day of chemotherapy   sildenafil  (VIAGRA ) 100 MG tablet Take 1 tablet (100 mg total) by mouth daily as needed for erectile dysfunction. Take two hours prior to intercourse on an empty stomach   testosterone  cypionate (DEPOTESTOSTERONE CYPIONATE) 200 MG/ML injection Inject 1 mL (200 mg total) into the muscle every 14 (fourteen) days. INJECT 1ML INTRAMUSCULARLY EVERY 14 DAYS   [DISCONTINUED] atorvastatin  (LIPITOR ) 80 MG tablet Take 1 tablet (80 mg total) by mouth daily.     Allergies:   Penicillins, Tetanus toxoids, Lisinopril, Losartan, Codeine, Doxycycline , and Ruxience  [rituximab -pvvr]   Social History   Socioeconomic History   Marital status: Divorced    Spouse name: Not on file   Number of children: Not on file   Years of education: Not on file   Highest education level: Not on file  Occupational History   Not on file  Tobacco Use   Smoking status: Former    Types: Cigars    Quit date: 11/30/2022    Years since quitting: 1.1   Smokeless tobacco: Never  Vaping Use   Vaping status: Former   Start date: 04/30/2017   Quit date: 11/30/2017  Substance and Sexual Activity   Alcohol use: Yes    Comment: beers 6 a day   Drug use: No   Sexual activity: Not Currently  Other Topics Concern   Not on file  Social History Narrative   Not on file   Social Drivers of Health   Financial Resource Strain: Medium Risk (04/05/2023)   Overall Financial Resource Strain (CARDIA)    Difficulty of Paying Living Expenses: Somewhat hard  Food Insecurity: Food Insecurity Present (05/07/2023)   Received from [facility]   Hunger Vital Sign    Worried About Running Out of Food in the Last Year: Sometimes true    Ran Out of Food in the Last Year: Sometimes true  Transportation Needs: No Transportation Needs (04/05/2023)   PRAPARE - Administrator, Civil Service (Medical): No    Lack of Transportation (Non-Medical): No   Physical Activity: Inactive (01/01/2023)   Exercise Vital Sign    Days of Exercise per Week: 0 days    Minutes of Exercise per Session: 0 min  Stress: No Stress Concern Present (04/05/2023)   [name] of Occupational Health - Occupational Stress Questionnaire    Feeling  of Stress : Only a little  Social Connections: Socially Isolated (04/05/2023)   Social Connection and Isolation Panel [NHANES]    Frequency of Communication with Friends and Family: More than three times a week    Frequency of Social Gatherings with Friends and Family: Twice a week    Attends Religious Services: Never    Database Administrator or Organizations: No    Attends Engineer, Structural: Never    Marital Status: Divorced     Family History: The patient's family history includes Diabetes in his mother and another family member; Emphysema in his father; Heart attack in his father.  ROS:   Please see the history of present illness.     All other systems reviewed and are negative.  EKGs/Labs/Other Studies Reviewed:    The following studies were reviewed today:  EKG Interpretation Date/Time:  Thursday January 06 2024 09:48:34 EST Ventricular Rate:  99 PR Interval:  142 QRS Duration:  90 QT Interval:  348 QTC Calculation: 446 R Axis:   -26  Text Interpretation: Normal sinus rhythm Normal ECG Confirmed by Darliss Rogue ([id number]) on 01/06/2024 10:05:58 AM    Recent Labs: 07/19/2023: TSH 2.900 12/27/2023: ALT 24; BUN 15; Creatinine 0.86; Hemoglobin 11.7; Platelet Count 146; Potassium 4.2; Sodium 131  Recent Lipid Panel    Component Value Date/Time   CHOL 146 07/19/2023 1421   TRIG 103 07/19/2023 1421   HDL 33 (L) 07/19/2023 1421   CHOLHDL 3.8 06/23/2022 1327   VLDL 19 06/23/2022 1327   LDLCALC 94 07/19/2023 1421   LDLCALC 108 (H) 10/15/2017 0906     Risk Assessment/Calculations:          Physical Exam:    VS:  BP 118/62 (BP Location: Left Arm, Patient Position: Sitting, Cuff  Size: Normal)   Pulse 99   Ht 6' 2 (1.88 m)   Wt 241 lb (109.3 kg)   SpO2 95%   BMI 30.94 kg/m     Wt Readings from Last 3 Encounters:  01/06/24 241 lb (109.3 kg)  12/27/23 241 lb (109.3 kg)  12/24/23 240 lb 9.6 oz (109.1 kg)     GEN:  Well nourished, well developed in no acute distress HEENT: Normal NECK: No JVD; No carotid bruits CARDIAC: Regular rate and rhythm RESPIRATORY:  Clear to auscultation without rales, wheezing or rhonchi  ABDOMEN: Soft, non-tender, non-distended MUSCULOSKELETAL:  No edema; Chemo-Port noted on left chest. SKIN: Warm and dry NEUROLOGIC:  Alert and oriented x 3 PSYCHIATRIC:  Normal affect   ASSESSMENT:    1. Inappropriate sinus tachycardia (HCC)   2. Primary hypertension   3. Hyperlipidemia, mixed    PLAN:    In order of problems listed above:  Patient with inappropriate sinus tach, overall improved with beta-blocker.  Heart rate 99 today.  Continue Toprol -XL 100 mg daily. Hypertension, BP controlled.  Norvasc  2.5 mg daily, Toprol -XL 100 mg daily. Hyperlipidemia, cholesterol controlled continue Lipitor  80 mg daily.  Follow-up yearly.   Medication Adjustments/Labs and Tests Ordered: Current medicines are reviewed at length with the patient today.  Concerns regarding medicines are outlined above.  Orders Placed This Encounter  Procedures   EKG 12-Lead   Meds ordered this encounter  Medications   DISCONTD: atorvastatin  (LIPITOR ) 80 MG tablet    Sig: Take 1 tablet (80 mg total) by mouth daily.    Dispense:  90 tablet    Refill:  3   atorvastatin  (LIPITOR ) 80 MG tablet    Sig: Take  1 tablet (80 mg total) by mouth daily.    Dispense:  90 tablet    Refill:  3    Patient Instructions  Medication Instructions:   Your physician recommends that you continue on your current medications as directed. Please refer to the Current Medication list given to you today.  *If you need a refill on your cardiac medications before your next  appointment, please call your pharmacy*   Lab Work:  None Ordered  If you have labs (blood work) drawn today and your tests are completely normal, you will receive your results only by: MyChart Message (if you have MyChart) OR A paper copy in the mail If you have any lab test that is abnormal or we need to change your treatment, we will call you to review the results.   Testing/Procedures:  None Ordered   Follow-Up: At [facility], you and your health needs are our priority.  As part of our continuing mission to provide you with exceptional heart care, we have created designated Provider Care Teams.  These Care Teams include your primary Cardiologist (physician) and Advanced Practice Providers (APPs -  Physician Assistants and Nurse Practitioners) who all work together to provide you with the care you need, when you need it.  We recommend signing up for the patient portal called MyChart.  Sign up information is provided on this After Visit Summary.  MyChart is used to connect with patients for Virtual Visits (Telemedicine).  Patients are able to view lab/test results, encounter notes, upcoming appointments, etc.  Non-urgent messages can be sent to your provider as well.   To learn more about what you can do with MyChart, go to [email or web address].    Your next appointment:    12 months     Signed, Redell Cave, MD  01/06/2024 11:08 AM    [facility]

## 2024-01-06 NOTE — Patient Instructions (Signed)
 Medication Instructions:   Your physician recommends that you continue on your current medications as directed. Please refer to the Current Medication list given to you today.  *If you need a refill on your cardiac medications before your next appointment, please call your pharmacy*   Lab Work:  None Ordered  If you have labs (blood work) drawn today and your tests are completely normal, you will receive your results only by: MyChart Message (if you have MyChart) OR A paper copy in the mail If you have any lab test that is abnormal or we need to change your treatment, we will call you to review the results.   Testing/Procedures:  None Ordered   Follow-Up: At [facility], you and your health needs are our priority.  As part of our continuing mission to provide you with exceptional heart care, we have created designated Provider Care Teams.  These Care Teams include your primary Cardiologist (physician) and Advanced Practice Providers (APPs -  Physician Assistants and Nurse Practitioners) who all work together to provide you with the care you need, when you need it.  We recommend signing up for the patient portal called MyChart.  Sign up information is provided on this After Visit Summary.  MyChart is used to connect with patients for Virtual Visits (Telemedicine).  Patients are able to view lab/test results, encounter notes, upcoming appointments, etc.  Non-urgent messages can be sent to your provider as well.   To learn more about what you can do with MyChart, go to [email or web address].    Your next appointment:    12 months

## 2024-01-07 ENCOUNTER — Encounter: Payer: Self-pay | Admitting: Oncology

## 2024-01-07 ENCOUNTER — Other Ambulatory Visit: Payer: Self-pay

## 2024-01-07 ENCOUNTER — Emergency Department: Payer: Medicare HMO

## 2024-01-07 ENCOUNTER — Inpatient Hospital Stay
Admission: EM | Admit: 2024-01-07 | Discharge: 2024-01-10 | DRG: 683 | Disposition: A | Discharge status: Home / Self Care | Payer: Medicare HMO | Attending: Internal Medicine | Admitting: Internal Medicine

## 2024-01-07 ENCOUNTER — Telehealth: Payer: Self-pay | Admitting: *Deleted

## 2024-01-07 DIAGNOSIS — Z85828 Personal history of other malignant neoplasm of skin: Secondary | ICD-10-CM | POA: Diagnosis not present

## 2024-01-07 DIAGNOSIS — Z833 Family history of diabetes mellitus: Secondary | ICD-10-CM | POA: Diagnosis not present

## 2024-01-07 DIAGNOSIS — Z8614 Personal history of Methicillin resistant Staphylococcus aureus infection: Secondary | ICD-10-CM

## 2024-01-07 DIAGNOSIS — G4733 Obstructive sleep apnea (adult) (pediatric): Secondary | ICD-10-CM | POA: Diagnosis present

## 2024-01-07 DIAGNOSIS — Z6830 Body mass index (BMI) 30.0-30.9, adult: Secondary | ICD-10-CM

## 2024-01-07 DIAGNOSIS — Z880 Allergy status to penicillin: Secondary | ICD-10-CM

## 2024-01-07 DIAGNOSIS — Z8616 Personal history of COVID-19: Secondary | ICD-10-CM

## 2024-01-07 DIAGNOSIS — Z1152 Encounter for screening for COVID-19: Secondary | ICD-10-CM

## 2024-01-07 DIAGNOSIS — Z888 Allergy status to other drugs, medicaments and biological substances status: Secondary | ICD-10-CM

## 2024-01-07 DIAGNOSIS — F4310 Post-traumatic stress disorder, unspecified: Secondary | ICD-10-CM | POA: Diagnosis present

## 2024-01-07 DIAGNOSIS — F1010 Alcohol abuse, uncomplicated: Secondary | ICD-10-CM | POA: Diagnosis present

## 2024-01-07 DIAGNOSIS — K219 Gastro-esophageal reflux disease without esophagitis: Secondary | ICD-10-CM | POA: Diagnosis present

## 2024-01-07 DIAGNOSIS — I5032 Chronic diastolic (congestive) heart failure: Secondary | ICD-10-CM | POA: Diagnosis present

## 2024-01-07 DIAGNOSIS — Z8249 Family history of ischemic heart disease and other diseases of the circulatory system: Secondary | ICD-10-CM

## 2024-01-07 DIAGNOSIS — N170 Acute kidney failure with tubular necrosis: Principal | ICD-10-CM | POA: Diagnosis present

## 2024-01-07 DIAGNOSIS — R42 Dizziness and giddiness: Secondary | ICD-10-CM | POA: Diagnosis not present

## 2024-01-07 DIAGNOSIS — C8202 Follicular lymphoma grade I, intrathoracic lymph nodes: Secondary | ICD-10-CM | POA: Diagnosis not present

## 2024-01-07 DIAGNOSIS — I110 Hypertensive heart disease with heart failure: Secondary | ICD-10-CM | POA: Diagnosis present

## 2024-01-07 DIAGNOSIS — N179 Acute kidney failure, unspecified: Secondary | ICD-10-CM | POA: Diagnosis present

## 2024-01-07 DIAGNOSIS — D84821 Immunodeficiency due to drugs: Secondary | ICD-10-CM | POA: Diagnosis not present

## 2024-01-07 DIAGNOSIS — T7840XA Allergy, unspecified, initial encounter: Secondary | ICD-10-CM | POA: Diagnosis not present

## 2024-01-07 DIAGNOSIS — F3160 Bipolar disorder, current episode mixed, unspecified: Secondary | ICD-10-CM | POA: Diagnosis present

## 2024-01-07 DIAGNOSIS — E872 Acidosis, unspecified: Secondary | ICD-10-CM | POA: Diagnosis present

## 2024-01-07 DIAGNOSIS — J4489 Other specified chronic obstructive pulmonary disease: Secondary | ICD-10-CM | POA: Diagnosis present

## 2024-01-07 DIAGNOSIS — C8590 Non-Hodgkin lymphoma, unspecified, unspecified site: Secondary | ICD-10-CM | POA: Diagnosis not present

## 2024-01-07 DIAGNOSIS — Z713 Dietary counseling and surveillance: Secondary | ICD-10-CM

## 2024-01-07 DIAGNOSIS — Z87891 Personal history of nicotine dependence: Secondary | ICD-10-CM

## 2024-01-07 DIAGNOSIS — T451X5A Adverse effect of antineoplastic and immunosuppressive drugs, initial encounter: Secondary | ICD-10-CM | POA: Diagnosis present

## 2024-01-07 DIAGNOSIS — R000 Tachycardia, unspecified: Secondary | ICD-10-CM | POA: Diagnosis not present

## 2024-01-07 DIAGNOSIS — E785 Hyperlipidemia, unspecified: Secondary | ICD-10-CM | POA: Diagnosis present

## 2024-01-07 DIAGNOSIS — Z79890 Hormone replacement therapy (postmenopausal): Secondary | ICD-10-CM

## 2024-01-07 DIAGNOSIS — R6520 Severe sepsis without septic shock: Secondary | ICD-10-CM | POA: Diagnosis not present

## 2024-01-07 DIAGNOSIS — R6510 Systemic inflammatory response syndrome (SIRS) of non-infectious origin without acute organ dysfunction: Secondary | ICD-10-CM | POA: Diagnosis present

## 2024-01-07 DIAGNOSIS — A419 Sepsis, unspecified organism: Principal | ICD-10-CM

## 2024-01-07 DIAGNOSIS — E669 Obesity, unspecified: Secondary | ICD-10-CM | POA: Diagnosis present

## 2024-01-07 DIAGNOSIS — M109 Gout, unspecified: Secondary | ICD-10-CM | POA: Diagnosis present

## 2024-01-07 DIAGNOSIS — I959 Hypotension, unspecified: Secondary | ICD-10-CM | POA: Diagnosis not present

## 2024-01-07 DIAGNOSIS — Z881 Allergy status to other antibiotic agents status: Secondary | ICD-10-CM

## 2024-01-07 DIAGNOSIS — Z885 Allergy status to narcotic agent status: Secondary | ICD-10-CM

## 2024-01-07 DIAGNOSIS — Z9221 Personal history of antineoplastic chemotherapy: Secondary | ICD-10-CM | POA: Diagnosis not present

## 2024-01-07 DIAGNOSIS — N401 Enlarged prostate with lower urinary tract symptoms: Secondary | ICD-10-CM | POA: Diagnosis present

## 2024-01-07 DIAGNOSIS — R531 Weakness: Secondary | ICD-10-CM | POA: Diagnosis not present

## 2024-01-07 DIAGNOSIS — Z981 Arthrodesis status: Secondary | ICD-10-CM

## 2024-01-07 DIAGNOSIS — Z7141 Alcohol abuse counseling and surveillance of alcoholic: Secondary | ICD-10-CM

## 2024-01-07 DIAGNOSIS — Z79899 Other long term (current) drug therapy: Secondary | ICD-10-CM

## 2024-01-07 DIAGNOSIS — E871 Hypo-osmolality and hyponatremia: Secondary | ICD-10-CM | POA: Diagnosis present

## 2024-01-07 DIAGNOSIS — I10 Essential (primary) hypertension: Secondary | ICD-10-CM | POA: Diagnosis present

## 2024-01-07 DIAGNOSIS — Z86011 Personal history of benign neoplasm of the brain: Secondary | ICD-10-CM

## 2024-01-07 DIAGNOSIS — E876 Hypokalemia: Secondary | ICD-10-CM | POA: Diagnosis not present

## 2024-01-07 DIAGNOSIS — Z7951 Long term (current) use of inhaled steroids: Secondary | ICD-10-CM

## 2024-01-07 DIAGNOSIS — T782XXA Anaphylactic shock, unspecified, initial encounter: Secondary | ICD-10-CM | POA: Diagnosis not present

## 2024-01-07 LAB — URINALYSIS, ROUTINE W REFLEX MICROSCOPIC
Bilirubin Urine: NEGATIVE
Glucose, UA: NEGATIVE mg/dL
Hgb urine dipstick: NEGATIVE
Ketones, ur: 5 mg/dL — AB
Leukocytes,Ua: NEGATIVE
Nitrite: NEGATIVE
Protein, ur: NEGATIVE mg/dL
Specific Gravity, Urine: 1.02 (ref 1.005–1.030)
pH: 5 (ref 5.0–8.0)

## 2024-01-07 LAB — LACTIC ACID, PLASMA
Lactic Acid, Venous: 2.9 mmol/L (ref 0.5–1.9)
Lactic Acid, Venous: 2.6 mmol/L (ref 0.5–1.9)
Lactic Acid, Venous: 1.8 mmol/L (ref 0.5–1.9)

## 2024-01-07 LAB — RESP PANEL BY RT-PCR (RSV, FLU A&B, COVID)  RVPGX2
Influenza A by PCR: NEGATIVE
Influenza B by PCR: NEGATIVE
Resp Syncytial Virus by PCR: NEGATIVE
SARS Coronavirus 2 by RT PCR: NEGATIVE

## 2024-01-07 LAB — CBC WITH DIFFERENTIAL/PLATELET
Abs Immature Granulocytes: 2 10*3/uL — ABNORMAL HIGH (ref 0.00–0.07)
Band Neutrophils: 3 %
Basophils Absolute: 0 10*3/uL (ref 0.0–0.1)
Basophils Relative: 0 %
Eosinophils Absolute: 0 10*3/uL (ref 0.0–0.5)
Eosinophils Relative: 0 %
HCT: 34.7 % — ABNORMAL LOW (ref 39.0–52.0)
Hemoglobin: 11.8 g/dL — ABNORMAL LOW (ref 13.0–17.0)
Lymphocytes Relative: 39 %
Lymphs Abs: 9.5 10*3/uL — ABNORMAL HIGH (ref 0.7–4.0)
MCH: 31.3 pg (ref 26.0–34.0)
MCHC: 34 g/dL (ref 30.0–36.0)
MCV: 92 fL (ref 80.0–100.0)
Metamyelocytes Relative: 8 %
Monocytes Absolute: 0.2 10*3/uL (ref 0.1–1.0)
Monocytes Relative: 1 %
Neutro Abs: 4.1 10*3/uL (ref 1.7–7.7)
Neutrophils Relative %: 14 %
Other: 35 %
Platelets: 69 10*3/uL — ABNORMAL LOW (ref 150–400)
RBC: 3.77 MIL/uL — ABNORMAL LOW (ref 4.22–5.81)
RDW: 13.2 % (ref 11.5–15.5)
Smear Review: DECREASED
WBC: 24.4 10*3/uL — ABNORMAL HIGH (ref 4.0–10.5)
nRBC: 0.1 % (ref 0.0–0.2)

## 2024-01-07 LAB — PROCALCITONIN: Procalcitonin: 1.12 ng/mL

## 2024-01-07 LAB — COMPREHENSIVE METABOLIC PANEL
ALT: 21 U/L (ref 0–44)
AST: 27 U/L (ref 15–41)
Albumin: 3.1 g/dL — ABNORMAL LOW (ref 3.5–5.0)
Alkaline Phosphatase: 81 U/L (ref 38–126)
Anion gap: 14 (ref 5–15)
BUN: 19 mg/dL (ref 8–23)
CO2: 23 mmol/L (ref 22–32)
Calcium: 8.4 mg/dL — ABNORMAL LOW (ref 8.9–10.3)
Chloride: 92 mmol/L — ABNORMAL LOW (ref 98–111)
Creatinine, Ser: 1.44 mg/dL — ABNORMAL HIGH (ref 0.61–1.24)
GFR, Estimated: 55 mL/min — ABNORMAL LOW (ref >60)
Glucose, Bld: 136 mg/dL — ABNORMAL HIGH (ref 70–99)
Potassium: 4.4 mmol/L (ref 3.5–5.1)
Sodium: 129 mmol/L — ABNORMAL LOW (ref 135–145)
Total Bilirubin: 1.3 mg/dL — ABNORMAL HIGH (ref 0.0–1.2)
Total Protein: 5.4 g/dL — ABNORMAL LOW (ref 6.5–8.1)

## 2024-01-07 LAB — CBC
HCT: 35 % — ABNORMAL LOW (ref 39.0–52.0)
Hemoglobin: 11.7 g/dL — ABNORMAL LOW (ref 13.0–17.0)
MCH: 31.2 pg (ref 26.0–34.0)
MCHC: 33.4 g/dL (ref 30.0–36.0)
MCV: 93.3 fL (ref 80.0–100.0)
Platelets: 70 10*3/uL — ABNORMAL LOW (ref 150–400)
RBC: 3.75 MIL/uL — ABNORMAL LOW (ref 4.22–5.81)
RDW: 13.1 % (ref 11.5–15.5)
WBC: 23.9 10*3/uL — ABNORMAL HIGH (ref 4.0–10.5)
nRBC: 0 % (ref 0.0–0.2)

## 2024-01-07 LAB — GROUP A STREP BY PCR: Group A Strep by PCR: NOT DETECTED

## 2024-01-07 LAB — TROPONIN I (HIGH SENSITIVITY)
Troponin I (High Sensitivity): 12 ng/L (ref <18)
Troponin I (High Sensitivity): 20 ng/L — ABNORMAL HIGH (ref <18)

## 2024-01-07 LAB — BRAIN NATRIURETIC PEPTIDE: B Natriuretic Peptide: 55.6 pg/mL (ref 0.0–100.0)

## 2024-01-07 MED ORDER — SODIUM CHLORIDE 0.9% FLUSH
10.0000 mL | Freq: Two times a day (BID) | INTRAVENOUS | Status: DC
  Administered 2024-01-07 – 2024-01-10 (×6): 10 mL

## 2024-01-07 MED ORDER — CHLORHEXIDINE GLUCONATE CLOTH 2 % EX PADS
6.0000 | MEDICATED_PAD | Freq: Every day | CUTANEOUS | Status: DC
  Administered 2024-01-07 – 2024-01-10 (×3): 6 via TOPICAL
  Filled 2024-01-07 (×2): qty 6

## 2024-01-07 MED ORDER — SODIUM CHLORIDE 0.9 % IV BOLUS
1000.0000 mL | Freq: Once | INTRAVENOUS | Status: AC
  Administered 2024-01-07: 1000 mL via INTRAVENOUS

## 2024-01-07 MED ORDER — SODIUM CHLORIDE 0.9% FLUSH
10.0000 mL | Freq: Two times a day (BID) | INTRAVENOUS | Status: DC
  Administered 2024-01-07 – 2024-01-10 (×4): 10 mL

## 2024-01-07 MED ORDER — SODIUM CHLORIDE 0.9 % IV BOLUS
500.0000 mL | Freq: Once | INTRAVENOUS | Status: AC
  Administered 2024-01-07: 500 mL via INTRAVENOUS

## 2024-01-07 MED ORDER — CEFEPIME HCL 2 G IV SOLR
2.0000 g | Freq: Once | INTRAVENOUS | Status: AC
  Administered 2024-01-07: 2 g via INTRAVENOUS
  Filled 2024-01-07: qty 12.5

## 2024-01-07 MED ORDER — OXYCODONE-ACETAMINOPHEN 5-325 MG PO TABS
1.0000 | ORAL_TABLET | ORAL | Status: DC | PRN
  Administered 2024-01-07 – 2024-01-10 (×5): 1 via ORAL
  Filled 2024-01-07 (×5): qty 1

## 2024-01-07 MED ORDER — SODIUM CHLORIDE 0.9% FLUSH: 10.0000 mL | INTRAVENOUS | Status: DC | PRN

## 2024-01-07 MED ORDER — PHENOL 1.4 % MT LIQD: 1.0000 | OROMUCOSAL | Status: DC | PRN

## 2024-01-07 MED ORDER — MORPHINE SULFATE (PF) 4 MG/ML IV SOLN
4.0000 mg | Freq: Once | INTRAVENOUS | Status: AC
Start: 2024-01-07 — End: 2024-01-07
  Administered 2024-01-07: 4 mg via INTRAVENOUS
  Filled 2024-01-07: qty 1

## 2024-01-07 MED ORDER — SODIUM CHLORIDE 1 G PO TABS
1.0000 g | ORAL_TABLET | Freq: Two times a day (BID) | ORAL | Status: DC
  Administered 2024-01-07 – 2024-01-10 (×6): 1 g via ORAL
  Filled 2024-01-07 (×6): qty 1

## 2024-01-07 MED ORDER — ACETAMINOPHEN 325 MG PO TABS: 650.0000 mg | ORAL_TABLET | Freq: Four times a day (QID) | ORAL | Status: DC | PRN

## 2024-01-07 MED ORDER — HYDRALAZINE HCL 20 MG/ML IJ SOLN: 5.0000 mg | INTRAMUSCULAR | Status: DC | PRN

## 2024-01-07 MED ORDER — STERILE WATER FOR INJECTION IJ SOLN
INTRAMUSCULAR | Status: AC
  Administered 2024-01-07: 3 mL
  Filled 2024-01-07: qty 10

## 2024-01-07 MED ORDER — ONDANSETRON HCL 4 MG/2ML IJ SOLN
4.0000 mg | Freq: Three times a day (TID) | INTRAMUSCULAR | Status: DC | PRN
  Administered 2024-01-08 – 2024-01-09 (×2): 4 mg via INTRAVENOUS
  Filled 2024-01-07 (×2): qty 2

## 2024-01-07 MED ORDER — SODIUM CHLORIDE 0.9 % IV SOLN
2.0000 g | Freq: Three times a day (TID) | INTRAVENOUS | Status: DC
  Administered 2024-01-08 – 2024-01-10 (×7): 2 g via INTRAVENOUS
  Filled 2024-01-07 (×8): qty 12.5

## 2024-01-07 MED ORDER — METRONIDAZOLE 500 MG/100ML IV SOLN
500.0000 mg | Freq: Once | INTRAVENOUS | Status: AC
Start: 2024-01-07 — End: 2024-01-07
  Administered 2024-01-07: 500 mg via INTRAVENOUS
  Filled 2024-01-07: qty 100

## 2024-01-07 MED ORDER — SODIUM CHLORIDE 0.9 % IV SOLN: INTRAVENOUS | Status: AC

## 2024-01-07 MED ORDER — VANCOMYCIN HCL 1500 MG/300ML IV SOLN
1500.0000 mg | INTRAVENOUS | Status: DC
  Administered 2024-01-08: 1500 mg via INTRAVENOUS
  Filled 2024-01-07 (×2): qty 300

## 2024-01-07 MED ORDER — ALTEPLASE 2 MG IJ SOLR
2.0000 mg | Freq: Once | INTRAMUSCULAR | Status: DC
  Filled 2024-01-07: qty 2

## 2024-01-07 MED ORDER — VANCOMYCIN HCL 2000 MG/400ML IV SOLN
2000.0000 mg | Freq: Once | INTRAVENOUS | Status: AC
  Administered 2024-01-07: 2000 mg via INTRAVENOUS
  Filled 2024-01-07: qty 400

## 2024-01-07 MED ORDER — ONDANSETRON HCL 4 MG/2ML IJ SOLN
4.0000 mg | Freq: Once | INTRAMUSCULAR | Status: AC
  Administered 2024-01-07: 4 mg via INTRAVENOUS
  Filled 2024-01-07: qty 2

## 2024-01-07 MED FILL — Dexamethasone Sodium Phosphate Inj 100 MG/10ML: INTRAMUSCULAR | Qty: 2 | Status: AC

## 2024-01-07 NOTE — ED Notes (Signed)
 IV team at bedside to attempt to get blood return as well. They will ask their partner about access and come back down.

## 2024-01-07 NOTE — ED Notes (Signed)
 RN to bedside to access port and start IV meds. Pt has gone to the bathroom.

## 2024-01-07 NOTE — ED Notes (Signed)
 Dr Alejo Amsler notified of lactic 2.9. orders to be placed as needed.

## 2024-01-07 NOTE — ED Provider Notes (Signed)
 [facility] Provider Note    Event Date/Time   First MD Initiated Contact with Patient 01/07/24 1557     (approximate)   History   Dizziness   HPI  Russell Simpson is a 63 y.o. male who presents to the ED for evaluation of Dizziness   Review oncology clinic visit from a couple weeks ago.  History of stage IV low-grade follicular lymphoma, HTN, HLD undergoing chemotherapy. Also review of cardiology clinic visit from yesterday.  COPD/asthma, 20+ pack year smoker history.  Patient presents for evaluation of about 36 hours of diffuse myalgias, sore throat, presyncopal dizziness without falls or syncope.   Physical Exam   Triage Vital Signs: ED Triage Vitals  Encounter Vitals Group     BP 01/07/24 1355 96/60     Systolic BP Percentile --      Diastolic BP Percentile --      Pulse Rate 01/07/24 1355 (!) 130     Resp 01/07/24 1355 20     Temp 01/07/24 1355 98.9 F (37.2 C)     Temp Source 01/07/24 1355 Oral     SpO2 01/07/24 1355 96 %     Weight 01/07/24 1354 240 lb (108.9 kg)     Height 01/07/24 1354 6' 2 (1.88 m)     Head Circumference --      Peak Flow --      Pain Score 01/07/24 1354 10     Pain Loc --      Pain Education --      Exclude from Growth Chart --     Most recent vital signs: Vitals:   01/07/24 1355 01/07/24 1745  BP: 96/60   Pulse: (!) 130 (!) 130  Resp: 20 19  Temp: 98.9 F (37.2 C)   SpO2: 96% 99%    General: Awake, no distress.  Seems uncomfortable CV:  Good peripheral perfusion.  Tachycardic and regular Resp:  Normal effort.  Abd:  No distention.  Soft MSK:  No deformity noted.  Neuro:  No focal deficits appreciated. Other:     ED Results / Procedures / Treatments   Labs (all labs ordered are listed, but only abnormal results are displayed) Labs Reviewed  CBC - Abnormal; Notable for the following components:      Result Value   WBC 23.9 (*)    RBC 3.75 (*)    Hemoglobin 11.7 (*)    HCT 35.0 (*)     Platelets 70 (*)    All other components within normal limits  COMPREHENSIVE METABOLIC PANEL - Abnormal; Notable for the following components:   Sodium 129 (*)    Chloride 92 (*)    Glucose, Bld 136 (*)    Creatinine, Ser 1.44 (*)    Calcium  8.4 (*)    Total Protein 5.4 (*)    Albumin 3.1 (*)    Total Bilirubin 1.3 (*)    GFR, Estimated 55 (*)    All other components within normal limits  LACTIC ACID, PLASMA - Abnormal; Notable for the following components:   Lactic Acid, Venous 2.9 (*)    All other components within normal limits  URINALYSIS, ROUTINE W REFLEX MICROSCOPIC - Abnormal; Notable for the following components:   Color, Urine AMBER (*)    APPearance HAZY (*)    Ketones, ur 5 (*)    All other components within normal limits  CBC WITH DIFFERENTIAL/PLATELET - Abnormal; Notable for the following components:   WBC 24.4 (*)  RBC 3.77 (*)    Hemoglobin 11.8 (*)    HCT 34.7 (*)    Platelets 69 (*)    Lymphs Abs 9.5 (*)    Abs Immature Granulocytes 2.00 (*)    All other components within normal limits  TROPONIN I (HIGH SENSITIVITY) - Abnormal; Notable for the following components:   Troponin I (High Sensitivity) 20 (*)    All other components within normal limits  RESP PANEL BY RT-PCR (RSV, FLU A&B, COVID)  RVPGX2  CULTURE, BLOOD (ROUTINE X 2)  CULTURE, BLOOD (ROUTINE X 2)  GROUP A STREP BY PCR  URINE CULTURE  PROCALCITONIN  LACTIC ACID, PLASMA  PATHOLOGIST SMEAR REVIEW  TROPONIN I (HIGH SENSITIVITY)    EKG Sinus tachycardia rate 129 bpm.  Leftward axis.  Normal intervals.  No ischemic features.  RADIOLOGY CXR interpreted by me without evidence of acute cardiopulmonary pathology.  Official radiology report(s): DG Chest Portable 1 View Result Date: 01/07/2024 CLINICAL DATA:  Generalized body ache and dizziness. History of non-Hodgkin lymphoma. EXAM: PORTABLE CHEST 1 VIEW COMPARISON:  04/06/2023 FINDINGS: No focal airspace consolidation, pulmonary edema, or pleural  effusion. The cardiopericardial silhouette is within normal limits for size. Right-sided Port-A-Cath again noted with tip overlying the low right innominate vein, potentially just at the innominate vein confluence. No acute bony abnormality. IMPRESSION: 1. No acute cardiopulmonary findings. 2. Right-sided Port-A-Cath tip overlies the low right innominate vein, potentially just at the innominate vein confluence. Tip projects higher on today's study, likely secondary to the more lordotic positioning on the current exam. Electronically Signed   By: Camellia Candle M.D.   On: 01/07/2024 17:04    PROCEDURES and INTERVENTIONS:  .1-3 Lead EKG Interpretation  Performed by: Claudene Rover, MD Authorized by: Claudene Rover, MD     Interpretation: abnormal     ECG rate:  124   ECG rate assessment: tachycardic     Rhythm: sinus tachycardia     Ectopy: none     Conduction: normal   .Critical Care  Performed by: Claudene Rover, MD Authorized by: Claudene Rover, MD   Critical care provider statement:    Critical care time (minutes):  30   Critical care time was exclusive of:  Separately billable procedures and treating other patients   Critical care was necessary to treat or prevent imminent or life-threatening deterioration of the following conditions:  Sepsis   Critical care was time spent personally by me on the following activities:  Development of treatment plan with patient or surrogate, discussions with consultants, evaluation of patient's response to treatment, examination of patient, ordering and review of laboratory studies, ordering and review of radiographic studies, ordering and performing treatments and interventions, pulse oximetry, re-evaluation of patient's condition and review of old charts   Medications  sodium chloride  0.9 % bolus 1,000 mL (has no administration in time range)  vancomycin  (VANCOREADY) IVPB 2000 mg/400 mL (has no administration in time range)  metroNIDAZOLE  (FLAGYL ) IVPB 500  mg (500 mg Intravenous New Bag/Given 01/07/24 1800)  0.9 %  sodium chloride  infusion (has no administration in time range)  sodium chloride  0.9 % bolus 1,000 mL (1,000 mLs Intravenous New Bag/Given 01/07/24 1730)  ceFEPIme  (MAXIPIME ) 2 g in sodium chloride  0.9 % 100 mL IVPB (0 g Intravenous Stopped 01/07/24 1758)  morphine  (PF) 4 MG/ML injection 4 mg (4 mg Intravenous Given 01/07/24 1727)  ondansetron  (ZOFRAN ) injection 4 mg (4 mg Intravenous Given 01/07/24 1726)     IMPRESSION / MDM / ASSESSMENT AND PLAN /  ED COURSE  I reviewed the triage vital signs and the nursing notes.  Differential diagnosis includes, but is not limited to, sepsis, bacteremia, shock, UTI, pneumonia, viral syndrome, dehydration, NSTEMI, ACS, meningitis or encephalitis  {Patient presents with symptoms of an acute illness or injury that is potentially life-threatening.  Cancer patient undergoing chemotherapy presents with generalized symptoms and signs of sepsis requiring medical admission.  Tachycardic but hemodynamically stable.  Not hypoxic.  Leukocytosis, elevated procalcitonin are noted.  Lactic acidosis.  cultures are drawn and he is started on broad-spectrum antibiotics.  Urine with ketones but no infectious features.  CXR is clear.  Uncertain source.  Consult medicine for admission.  Clinical Course as of 01/07/24 1818  Fri Jan 07, 2024  1508 Informed by nursing staff of patient's increased lactic acid.  IV normal saline ordered given patient's mild hyponatremia. [EB]  1603 2 days sore throat, diffuse myalgias. Rhinorrhea .  [DS]    Clinical Course User Index [DS] Claudene Rover, MD [EB] Jossie Artist POUR, MD     FINAL CLINICAL IMPRESSION(S) / ED DIAGNOSES   Final diagnoses:  Sepsis with acute renal failure and tubular necrosis without septic shock, due to unspecified organism [facility])     Rx / DC Orders   ED Discharge Orders     None        Note:  This document was prepared using Dragon voice recognition  software and may include unintentional dictation errors.   Claudene Rover, MD 01/07/24 [id number]

## 2024-01-07 NOTE — Consult Note (Signed)
 Pharmacy Antibiotic Note  Russell Simpson is a 63 y.o. male admitted on 01/07/2024 with sepsis. PMH includes stage IV low-grade follicular lymphoma, HTN, HLD undergoing chemotherapy.  Pharmacy has been consulted for Vancomycin  and cefepime  dosing.   Plan: Cefepime  2g IV q 8 hours Vancomycin  2000 mg IV x1 then Vancomycin  IV 1500 mg Q 24 hrs.  Goal AUC 400-550. Expected AUC: 488.6 Expected Cmin:10.4 SCr used: 1.44 3. Will continue to monitor cultures and renal function to adjust therapy as needed.  Height: 6' 2 (188 cm) Weight: 108.9 kg (240 lb) IBW/kg (Calculated) : 82.2  Temp (24hrs), Avg:98.9 F (37.2 C), Min:98.9 F (37.2 C), Max:98.9 F (37.2 C)  Recent Labs  Lab 01/07/24 1409 01/07/24 1754  WBC 24.4*  23.9*  --   CREATININE 1.44*  --   LATICACIDVEN 2.9* 2.6*    Estimated Creatinine Clearance: 70.8 mL/min (A) (by C-G formula based on SCr of 1.44 mg/dL (H)).    Allergies  Allergen Reactions   Penicillins Anaphylaxis    Tolerates cefdinir , cephalexin and amoxicillin/clavulonate so true PCN allergy unlikely   Tetanus Toxoids Swelling   Lisinopril Cough   Losartan      Other reaction(s): Muscle Pain     Codeine Nausea Only and Nausea And Vomiting   Doxycycline  Rash   Ruxience  [Rituximab -Pvvr] Nausea And Vomiting, Rash and Other (See Comments)    05/06/21 pt w/ worsening rash during Ruxience  infusion.  Face, neck and chest flushing redness  12/27/2023: flushing, diaphoresis, nausea and vomiting    Antimicrobials this admission: Vancomycin  2/7 >>  Cefepime  2/7 >>   Dose adjustments this admission: n/a  Microbiology results: 2/7 BCx: in process   Thank you for allowing pharmacy to be a part of this patient's care.  Mathews Stuhr Rodriguez-Guzman PharmD, BCPS 01/07/2024 8:09 PM

## 2024-01-07 NOTE — Sepsis Progress Note (Signed)
 Elink will follow per sepsis protocol.

## 2024-01-07 NOTE — ED Notes (Signed)
 Port accessed. Will not draw back blood. Pt is aware and advised it takes a special took to get blood. Called lab for collections.

## 2024-01-07 NOTE — Consult Note (Signed)
 CODE SEPSIS - PHARMACY COMMUNICATION  **Broad Spectrum Antibiotics should be administered within 1 hour of Sepsis diagnosis**  Time Code Sepsis Called/Page Received: 1818  Patient received cefepime  and metronidazole  prior to code sepsis activation.  Additional action taken by pharmacy: none  If necessary, Name of Provider/Nurse Contacted: n/a   Russell Simpson PharmD, BCPS 01/07/2024 6:36 PM

## 2024-01-07 NOTE — ED Triage Notes (Addendum)
 First nurse note: pt to ED [facility] for generalized body aches. Has been taking chemo x2 weeks.  Immediately on arrival, pt asking for bottle water , informed pt not at this time, needs to be assessed. Pt on the phone in triage yelling about water .   Pt reports feeling like having side effects from chemo. +dizziness.

## 2024-01-07 NOTE — H&P (Signed)
 History and Physical    Russell Simpson FMW:[id number] DOB: June 08, 1961 DOA: 01/07/2024  Referring MD/NP/PA:   PCP: Melvin Pao, NP   Patient coming from:  The patient is coming from home.     Chief Complaint: Sore throat, malaise, body aches, generalized weakness  HPI: Russell Simpson is a 63 y.o. male with medical history significant of lymphoma on chemotherapy, former smoker, alcohol abuse, hypertension, hyperlipidemia, COPD not oxygen, asthma, gout, depression with anxiety, BPH, skin cancer, OSA not on CPAP, diastolic CHF, PTSD, bipolar, obesity, who presents with sore throat, malaise, body aches, generalized weakness  Patient states that he has been feeling bad, reports sore throat, nasal, body aches, generalized weakness.  No chest pain, SOB, cough.  No nausea, vomiting, diarrhea or abdominal pain.  No symptoms of UTI.   Data reviewed independently and ED Course: pt was found to have negative PCR for flu, COVID and RSV.  WBC 24.4, lactic acid 2.6 --> 2.9, procalcitonin 1.12 troponin 12--> 2, BNP 55.6, negative urinalysis, negative strep A, negative chest x-ray.  Patient is admitted to telemetry bed as inpatient.   EKG: I have personally reviewed.  Sinus rhythm, QTc 439, heart rate 129, LAD, low voltage.   Review of Systems:   General: no fevers, chills, no body weight gain, fatigue HEENT: no blurry vision, hearing changes. Has sore throat Respiratory: no dyspnea, coughing, wheezing CV: no chest pain, no palpitations GI: no nausea, vomiting, abdominal pain, diarrhea, constipation GU: no dysuria, burning on urination, increased urinary frequency, hematuria  Ext: no leg edema Neuro: no unilateral weakness, numbness, or tingling, no vision change or hearing loss Skin: no rash, no skin tear. MSK: No muscle spasm, no deformity, no limitation of range of movement in spin Heme: No easy bruising.  Travel history: No recent long distant travel.   Allergy:  Allergies  Allergen  Reactions   Penicillins Anaphylaxis    Tolerates cefdinir , cephalexin and amoxicillin/clavulonate so true PCN allergy unlikely   Tetanus Toxoids Swelling   Lisinopril Cough   Losartan      Other reaction(s): Muscle Pain     Codeine Nausea Only and Nausea And Vomiting   Doxycycline  Rash   Ruxience  [Rituximab -Pvvr] Nausea And Vomiting, Rash and Other (See Comments)    05/06/21 pt w/ worsening rash during Ruxience  infusion.  Face, neck and chest flushing redness  12/27/2023: flushing, diaphoresis, nausea and vomiting    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HCC)    Arthritis    Asthma    Basal cell carcinoma 01/28/2021   R upper arm, EDC 03/04/2021   Basal cell carcinoma 12/31/2022   Right temporal hair line. Nodular. 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   left upper back ED&C done   Hypertension  Hyponatremia    Inappropriate sinus tachycardia (HCC)    Non Hodgkin's lymphoma (HCC)    Pneumonia    RSV (respiratory syncytial virus infection)    SCC (squamous cell carcinoma) 08/28/2021   upper chest right of midline, EDC done 09/17/2021   SCC (squamous cell carcinoma) 09/30/2022   left chest  ED&C done   Sleep apnea    does not wear CPAP ; uses humidifier instead   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   right upper back, ED&C  06/18/2022   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   right upper back ED&C done   Squamous cell carcinoma of skin 02/19/2021   L inferior mandible, treated with EDC   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/28/2021   R ant shoulder - ED&C   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/28/2021   L upper abdomen - ED&C    Past Surgical History:  Procedure Laterality Date   ANTERIOR CERVICAL DECOMP/DISCECTOMY FUSION N/A 06/17/2016   Procedure: ANTERIOR CERVICAL DECOMPRESSION FUSION, CERVICAL 3-4, CERVICAL 4-5 WITH INSTRUMENTATION AND ALLOGRAFT;  Surgeon: Oneil Priestly, MD;  Location: [facility] OR;  Service: Orthopedics;  Laterality: N/A;  ANTERIOR CERVICAL DECOMPRESSION FUSION, CERVICAL 3-4, CERVICAL 4-5 WITH INSTRUMENTATION AND ALLOGRAFT   BACK SURGERY     x3   COLONOSCOPY WITH PROPOFOL  N/A 12/10/2023   Procedure: COLONOSCOPY WITH PROPOFOL ;  Surgeon: Unk Corinn Skiff, MD;  Location: [facility] ENDOSCOPY;  Service: Gastroenterology;  Laterality: N/A;   HARDWARE REMOVAL Right 05/04/2023   Procedure: HARDWARE REMOVAL;  Surgeon: Marchia Drivers, MD;  Location: [facility] ORS;  Service: Orthopedics;  Laterality: Right;   HEMOSTASIS CLIP PLACEMENT  12/10/2023   Procedure: HEMOSTASIS CLIP PLACEMENT;  Surgeon: Unk Corinn Skiff, MD;  Location: [facility] ENDOSCOPY;  Service: Gastroenterology;;   HEMOSTASIS CONTROL  12/10/2023   Procedure: HEMOSTASIS CONTROL;  Surgeon: Unk Corinn Skiff, MD;  Location: [facility] ENDOSCOPY;  Service: Gastroenterology;;   NECK SURGERY  12/01/2007   ORIF ANKLE FRACTURE Right 12/02/2022   Procedure: OPEN REDUCTION INTERNAL FIXATION (ORIF) ANKLE FRACTURE;  Surgeon: Marchia Drivers, MD;  Location: [facility] ORS;  Service: Orthopedics;  Laterality: Right;   ORIF ANKLE FRACTURE Right 12/01/2022   Procedure: OPEN REDUCTION INTERNAL FIXATION (ORIF) ANKLE FRACTURE;  Surgeon: Marchia Drivers, MD;  Location: [facility] ORS;  Service: Orthopedics;  Laterality: Right;   POLYPECTOMY  12/10/2023   Procedure: POLYPECTOMY;   Surgeon: Unk Corinn Skiff, MD;  Location: [facility] ENDOSCOPY;  Service: Gastroenterology;;   PORTA CATH INSERTION N/A 04/03/2021   Procedure: PORTA CATH INSERTION;  Surgeon: Marea Selinda RAMAN, MD;  Location: [facility] INVASIVE CV LAB;  Service: Cardiovascular;  Laterality: N/A;   SUBMUCOSAL LIFTING INJECTION  12/10/2023   Procedure: SUBMUCOSAL LIFTING INJECTION;  Surgeon: Unk Corinn Skiff, MD;  Location: [facility] ENDOSCOPY;  Service: Gastroenterology;;   SUBMUCOSAL TATTOO INJECTION  12/10/2023   Procedure: SUBMUCOSAL TATTOO INJECTION;  Surgeon: Unk Corinn Skiff, MD;  Location: [facility] ENDOSCOPY;  Service: Gastroenterology;;   VIDEO BRONCHOSCOPY WITH ENDOBRONCHIAL ULTRASOUND Bilateral 10/14/2022   Procedure: VIDEO BRONCHOSCOPY WITH ENDOBRONCHIAL ULTRASOUND;  Surgeon: Tamea Dedra CROME, MD;  Location: [facility] ORS;  Service: Pulmonary;  Laterality: Bilateral;    Social History:  reports that he quit smoking about 13 months ago. His smoking use included cigars. He has never used smokeless tobacco. He reports current alcohol use. He reports that he does not use drugs.  Family History:  Family History  Problem Relation Age of Onset   Diabetes Mother    Heart attack Father  Emphysema Father    Diabetes Other      Prior to Admission medications   Medication Sig Start Date End Date Taking? Authorizing Provider  acetaminophen  (TYLENOL ) 325 MG tablet Take 2 tablets (650 mg) by mouth starting 2 days prior to chemo then 3 days after chemotherapy. Do not take on day of chemotherapy 12/06/23   Dasie Tinnie MATSU, NP  albuterol  (VENTOLIN  HFA) 108 (90 Base) MCG/ACT inhaler Inhale 2 puffs into the lungs every 6 (six) hours as needed for wheezing or shortness of breath. 01/28/23   Tamea Dedra CROME, MD  amLODipine  (NORVASC ) 2.5 MG tablet TAKE 1 TABLET BY MOUTH ONCE DAILY 10/05/23   Melvin Pao, NP  atorvastatin  (LIPITOR ) 80 MG tablet Take 1 tablet (80 mg total) by mouth daily. 01/06/24   Darliss Rogue, MD  Blood  Glucose Monitoring Suppl (ONE TOUCH ULTRA 2) w/Device KIT 1 each by Does not apply route daily. 01/01/23   Melvin Pao, NP  buPROPion  (WELLBUTRIN  XL) 300 MG 24 hr tablet Take 300 mg by mouth daily. 12/03/23   [provider]  cetirizine  (ZYRTEC ) 10 MG tablet Take 1 tablet (10 mg total) by mouth daily. 03/08/23   Melvin Pao, NP  clonazePAM  (KLONOPIN ) 0.5 MG tablet Take 0.5 mg by mouth every evening.    Severa Clarity, PA-C  diphenhydrAMINE  (BENADRYL ) 25 mg capsule Starting 2 days prior to your chemotherapy infusions, take 1 pill (25 mg) by mouth daily. Do not take on day of chemotherapy. 12/06/23   Dasie Tinnie MATSU, NP  DULoxetine  (CYMBALTA ) 60 MG capsule Take 60 mg by mouth every evening. 07/01/22   [provider]  famotidine  (PEPCID ) 20 MG tablet Take 1 tablet (20 mg total) by mouth 2 (two) times daily. 08/25/23   Mecum, Erin E, PA-C  famotidine  (PEPCID ) 20 MG tablet Starting 2 days prior to your chemotherapy infusions, take 1 pill (20 mg) by mouth daily. Do not take on day of chemotherapy. 12/06/23   Dasie Tinnie MATSU, NP  finasteride  (PROSCAR ) 5 MG tablet TAKE 1 TABLET BY MOUTH ONCE DAILY 08/18/23   Helon Kirsch A, PA-C  fluticasone  (FLONASE ) 50 MCG/ACT nasal spray Place 2 sprays into both nostrils daily. 03/08/23   Melvin Pao, NP  Fluticasone -Umeclidin-Vilant (TRELEGY ELLIPTA ) 200-62.5-25 MCG/ACT AEPB Inhale 1 puff into the lungs daily. Patient not taking: Reported on 01/06/2024 01/28/23   Tamea Dedra CROME, MD  glucose blood [facility] ULTRA) test strip 1 each by Other route daily. Use as instructed 01/01/23   Melvin Pao, NP  Lancets [facility] ULTRASOFT) lancets 1 each by Other route daily. Use as instructed 01/01/23   Melvin Pao, NP  lenalidomide  (REVLIMID ) 15 MG capsule Take 1 capsule (15 mg total) by mouth daily. Take for 21 days, then hold for 7 days. Repeat every 28 days. 12/09/23   Melanee Annah BROCKS, MD  metoprolol  succinate (TOPROL -XL) 100 MG 24 hr tablet TAKE 1  TABLET BY MOUTH ONCE DAILY WITH OR IMMEDIATELY FOLLOWING A MEAL 10/26/23   Melvin Pao, NP  mirtazapine  (REMERON ) 15 MG tablet Take 15 mg by mouth at bedtime. 06/30/22   [provider]  montelukast  (SINGULAIR ) 10 MG tablet TAKE 1 TABLET EVERY DAY 11/19/23   Melvin Pao, NP  montelukast  (SINGULAIR ) 10 MG tablet Starting 2 days prior to your chemotherapy infusions, take 1 pill (10 mg) by mouth at night. Do not take on day of chemotherapy. 12/06/23   Dasie Tinnie MATSU, NP  naltrexone  (DEPADE) 50 MG tablet Take 50 mg by mouth daily.  [provider]  OLANZapine  (ZYPREXA ) 5 MG tablet Take 5 mg by mouth at bedtime. 01/28/22   [provider]  ondansetron  (ZOFRAN ) 4 MG tablet Take 1 tablet (4 mg total) by mouth every 8 (eight) hours as needed for nausea or vomiting. 12/17/23   Melanee Annah BROCKS, MD  predniSONE  (DELTASONE ) 50 MG tablet Take 1 tablet (50 mg) by mouth starting 2 days prior to chemo then 3 days after chemotherapy. Do not take on day of chemotherapy 12/06/23   Dasie Tinnie MATSU, NP  sildenafil  (VIAGRA ) 100 MG tablet Take 1 tablet (100 mg total) by mouth daily as needed for erectile dysfunction. Take two hours prior to intercourse on an empty stomach 03/26/23   Helon Kirsch A, PA-C  tenofovir  alafenamide (VEMLIDY ) 25 MG tablet Take 1 tablet (25 mg total) by mouth daily. Patient not taking: Reported on 01/06/2024 12/27/23   Melanee Annah BROCKS, MD  testosterone  cypionate (DEPOTESTOSTERONE CYPIONATE) 200 MG/ML injection Inject 1 mL (200 mg total) into the muscle every 14 (fourteen) days. INJECT 1ML INTRAMUSCULARLY EVERY 14 DAYS 10/20/23   Helon Kirsch LABOR, PA-C    Physical Exam: Vitals:   01/07/24 1800 01/07/24 2011 01/07/24 2014 01/07/24 2230  BP: 108/63 120/64 120/64 (!) 109/56  Pulse: (!) 132 (!) 128 (!) 132 (!) 117  Resp: 13 (!) 25 (!) 25 18  Temp:   99.4 F (37.4 C)   TempSrc:   Oral   SpO2: 99% 94% 94% 94%  Weight:      Height:       General: Not in acute  distress HEENT:       Eyes: PERRL, EOMI, no jaundice       ENT: No discharge from the ears and nose, no tonsillar enlargement.        Neck: No JVD, no bruit, no mass felt. Heme: No neck lymph node enlargement. Cardiac: S1/S2, RRR, No murmurs, No gallops or rubs. Respiratory: No rales, wheezing, rhonchi or rubs. GI: Soft, nondistended, nontender, no rebound pain, no organomegaly, BS present. GU: No hematuria Ext: No pitting leg edema bilaterally. 1+DP/PT pulse bilaterally. Musculoskeletal: No joint deformities, No joint redness or warmth, no limitation of ROM in spin. Skin: No rashes.  Neuro: Alert, oriented X3, cranial nerves II-XII grossly intact, moves all extremities normally.  Psych: Patient is not psychotic, no suicidal or hemocidal ideation.  Labs on Admission: I have personally reviewed following labs and imaging studies  CBC: Recent Labs  Lab 01/07/24 1409  WBC 24.4*  23.9*  NEUTROABS 4.1  HGB 11.8*  11.7*  HCT 34.7*  35.0*  MCV 92.0  93.3  PLT 69*  70*   Basic Metabolic Panel: Recent Labs  Lab 01/07/24 1409  NA 129*  K 4.4  CL 92*  CO2 23  GLUCOSE 136*  BUN 19  CREATININE 1.44*  CALCIUM  8.4*   GFR: Estimated Creatinine Clearance: 70.8 mL/min (A) (by C-G formula based on SCr of 1.44 mg/dL (H)). Liver Function Tests: Recent Labs  Lab 01/07/24 1409  AST 27  ALT 21  ALKPHOS 81  BILITOT 1.3*  PROT 5.4*  ALBUMIN 3.1*   No results for input(s): LIPASE, AMYLASE in the last 168 hours. No results for input(s): AMMONIA in the last 168 hours. Coagulation Profile: No results for input(s): INR, PROTIME in the last 168 hours. Cardiac Enzymes: No results for input(s): CKTOTAL, CKMB, CKMBINDEX, TROPONINI in the last 168 hours. BNP (last 3 results) No results for input(s): PROBNP in the last 8760 hours.  HbA1C: No results for input(s): HGBA1C in the last 72 hours. CBG: No results for input(s): GLUCAP in the last 168 hours. Lipid  Profile: No results for input(s): CHOL, HDL, LDLCALC, TRIG, CHOLHDL, LDLDIRECT in the last 72 hours. Thyroid  Function Tests: No results for input(s): TSH, T4TOTAL, FREET4, T3FREE, THYROIDAB in the last 72 hours. Anemia Panel: No results for input(s): VITAMINB12, FOLATE, FERRITIN, TIBC, IRON, RETICCTPCT in the last 72 hours. Urine analysis:    Component Value Date/Time   COLORURINE AMBER (A) 01/07/2024 1731   APPEARANCEUR HAZY (A) 01/07/2024 1731   APPEARANCEUR Clear 04/07/2022 1440   LABSPEC 1.020 01/07/2024 1731   LABSPEC 1.017 07/19/2013 0635   PHURINE 5.0 01/07/2024 1731   GLUCOSEU NEGATIVE 01/07/2024 1731   GLUCOSEU Negative 07/19/2013 0635   HGBUR NEGATIVE 01/07/2024 1731   BILIRUBINUR NEGATIVE 01/07/2024 1731   BILIRUBINUR Negative 04/07/2022 1440   BILIRUBINUR Negative 07/19/2013 0635   KETONESUR 5 (A) 01/07/2024 1731   PROTEINUR NEGATIVE 01/07/2024 1731   UROBILINOGEN 0.2 10/02/2019 1818   NITRITE NEGATIVE 01/07/2024 1731   LEUKOCYTESUR NEGATIVE 01/07/2024 1731   LEUKOCYTESUR Negative 07/19/2013 0635   Sepsis Labs: @LABRCNTIP (procalcitonin:4,lacticidven:4) ) Recent Results (from the past 240 hours)  Resp panel by RT-PCR (RSV, Flu A&B, Covid) Anterior Nasal Swab     Status: None   Collection Time: 01/07/24  2:15 PM   Specimen: Anterior Nasal Swab  Result Value Ref Range Status   SARS Coronavirus 2 by RT PCR NEGATIVE NEGATIVE Final    Comment: (NOTE) SARS-CoV-2 target nucleic acids are NOT DETECTED.  The SARS-CoV-2 RNA is generally detectable in upper respiratory specimens during the acute phase of infection. The lowest concentration of SARS-CoV-2 viral copies this assay can detect is 138 copies/mL. A negative result does not preclude SARS-Cov-2 infection and should not be used as the sole basis for treatment or other patient management decisions. A negative result may occur with  improper specimen collection/handling, submission  of specimen other than nasopharyngeal swab, presence of viral mutation(s) within the areas targeted by this assay, and inadequate number of viral copies(<138 copies/mL). A negative result must be combined with clinical observations, patient history, and epidemiological information. The expected result is Negative.  Fact Sheet for Patients:  bloggercourse.com  Fact Sheet for Healthcare Providers:  seriousbroker.it  This test is no t yet approved or cleared by the [place]  FDA and  has been authorized for detection and/or diagnosis of SARS-CoV-2 by FDA under an Emergency Use Authorization (EUA). This EUA will remain  in effect (meaning this test can be used) for the duration of the COVID-19 declaration under Section 564(b)(1) of the Act, 21 U.S.C.section 360bbb-3(b)(1), unless the authorization is terminated  or revoked sooner.       Influenza A by PCR NEGATIVE NEGATIVE Final   Influenza B by PCR NEGATIVE NEGATIVE Final    Comment: (NOTE) The Xpert Xpress SARS-CoV-2/FLU/RSV plus assay is intended as an aid in the diagnosis of influenza from Nasopharyngeal swab specimens and should not be used as a sole basis for treatment. Nasal washings and aspirates are unacceptable for Xpert Xpress SARS-CoV-2/FLU/RSV testing.  Fact Sheet for Patients: bloggercourse.com  Fact Sheet for Healthcare Providers: seriousbroker.it  This test is not yet approved or cleared by the [place]  FDA and has been authorized for detection and/or diagnosis of SARS-CoV-2 by FDA under an Emergency Use Authorization (EUA). This EUA will remain in effect (meaning this test can be used) for the duration of the COVID-19 declaration under Section 564(b)(1)  of the Act, 21 U.S.C. section 360bbb-3(b)(1), unless the authorization is terminated or revoked.     Resp Syncytial Virus by PCR NEGATIVE NEGATIVE  Final    Comment: (NOTE) Fact Sheet for Patients: bloggercourse.com  Fact Sheet for Healthcare Providers: seriousbroker.it  This test is not yet approved or cleared by the [place]  FDA and has been authorized for detection and/or diagnosis of SARS-CoV-2 by FDA under an Emergency Use Authorization (EUA). This EUA will remain in effect (meaning this test can be used) for the duration of the COVID-19 declaration under Section 564(b)(1) of the Act, 21 U.S.C. section 360bbb-3(b)(1), unless the authorization is terminated or revoked.  Performed at [facility], [address] [place], [place] [address]   Group A Strep by PCR [facility] Only)     Status: None   Collection Time: 01/07/24  5:57 PM   Specimen: Throat; Sterile Swab  Result Value Ref Range Status   Group A Strep by PCR NOT DETECTED NOT DETECTED Final    Comment: Performed at [facility], 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  Lactic acid 2.6 --> 2.9.  Procalcitonin 1.12.  No clear source is identified.  Urinalysis negative.  Chest x-ray negative.  Patient reports sore throat, but Strep A is negative.  Patient is immunosuppressed due to chemotherapy, will start broad antibiotics.  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 - Patient is following up with Dr. Melanee. -Continue home lenalidomide  (patient is concerned about the cost of this medication)..  Will allow patient to take his home medication.  Chronic diastolic CHF (congestive heart failure) (HCC): 2D echo on 08/09/2020 showed EF acetic acid 5% with grade 1 diastolic dysfunction.  BNP normal 55.6.  No leg edema.  CHF is compensated. -Watch volume status closely  Essential hypertension -IV hydralazine  as needed -Hold amlodipine  since patient is at high risk of developing hypotension due to SIRS -Continue metoprolol   AKI (acute kidney injury) (HCC): Baseline creatinine 0.86 on 12/27/2023.  His creatinine is 1.  44, GFR 58, BNP 19.  May be due to dehydration. IV fluid as above -Avoid using renal toxic medications.  Hyperlipidemia -Lipitor   Benign prostatic hyperplasia with lower urinary tract symptoms -Proscar   Alcohol abuse -CIWA protocol -Did counseling about importance of cutting down his drinking  Mixed bipolar I disorder (HCC) -Wellbutrin , Cymbalta , Klonopin , Remeron   Obesity with body mass index (BMI) of 30.0 to 39.9: Body weight 108.9 kg, BMI 30.81 -Encourage losing weight -Exercise healthy diet      DVT ppx: SCD  Code Status:  Full code     Family Communication:     not done, no family member is at bed side.      Disposition Plan:  Anticipate discharge back to previous environment  Consults called:  none  Admission status and Level of care: Telemetry Medical:    for obs as inpt        Dispo: The patient is from: Home              Anticipated d/c is to: Home              Anticipated d/c date is: 2 days               Patient currently is not medically stable to d/c.    Severity of Illness:  The appropriate patient status for this patient is INPATIENT. Inpatient status is judged to be reasonable and necessary in order to provide the required intensity of service to ensure the patient's safety. The patient's presenting symptoms, physical exam findings, and initial radiographic and laboratory data in the context of their chronic comorbidities is felt to place them at high risk for further clinical deterioration. Furthermore, it is not anticipated that the patient will be medically stable for discharge from the hospital within 2 midnights of admission.   * I certify that at the point of admission it is my clinical judgment that the patient will require inpatient hospital care spanning beyond 2 midnights from the point of admission due to high intensity of service, high risk for further deterioration and high frequency of surveillance required.*       Date of Service 01/08/2024    Caleb Exon [facility]   If 7PM-7AM, please contact night-coverage [email or web address] 01/08/2024, 12:48 AM

## 2024-01-07 NOTE — ED Provider Triage Note (Signed)
 Emergency Medicine Provider Triage Evaluation Note  Russell Simpson , Simpson 63 y.o. male  was evaluated in triage.  Pt complains of possible reaction to new chemo medication he started 2 weeks ago for Non Hodgkins lymphoma. Complains of generalized body aches and dizziness.   Review of Systems  Positive:  Negative:   Physical Exam  BP 96/60 (BP Location: Left Arm)   Pulse (!) 130   Temp 98.9 F (37.2 C) (Oral)   Resp 20   Ht 6' 2 (1.88 m)   Wt 108.9 kg   SpO2 96%   BMI 30.81 kg/m  Gen:   Awake, no distress   Resp:  Normal effort LCTAB MSK:   Moves extremities without difficulty  Other:    Medical Decision Making  Medically screening exam initiated at 2:14 PM.  Appropriate orders placed.  Russell Simpson was informed that the remainder of the evaluation will be completed by another provider, this initial triage assessment does not replace that evaluation, and the importance of remaining in the ED until their evaluation is complete.    Russell Simpson, Russell Bennis A, PA-C 01/07/24 1416

## 2024-01-07 NOTE — ED Notes (Signed)
Port Accessed ?

## 2024-01-07 NOTE — Telephone Encounter (Signed)
 I got a message from the patient and I had called the patient about 40 minutes after he had called and when I talked to him he said he was achy all over he was having brown urine and they have checked his blood pressure at his house and it was 87/46 and he needed to go to the ER so he was going to call and have it done and I see that he is already been in ER as I type

## 2024-01-08 DIAGNOSIS — I5032 Chronic diastolic (congestive) heart failure: Secondary | ICD-10-CM

## 2024-01-08 DIAGNOSIS — C8202 Follicular lymphoma grade I, intrathoracic lymph nodes: Secondary | ICD-10-CM

## 2024-01-08 DIAGNOSIS — R6510 Systemic inflammatory response syndrome (SIRS) of non-infectious origin without acute organ dysfunction: Secondary | ICD-10-CM | POA: Diagnosis not present

## 2024-01-08 DIAGNOSIS — F1010 Alcohol abuse, uncomplicated: Secondary | ICD-10-CM | POA: Diagnosis present

## 2024-01-08 DIAGNOSIS — N179 Acute kidney failure, unspecified: Secondary | ICD-10-CM

## 2024-01-08 DIAGNOSIS — I10 Essential (primary) hypertension: Secondary | ICD-10-CM

## 2024-01-08 LAB — CBC
HCT: 29.2 % — ABNORMAL LOW (ref 39.0–52.0)
Hemoglobin: 9.7 g/dL — ABNORMAL LOW (ref 13.0–17.0)
MCH: 31.2 pg (ref 26.0–34.0)
MCHC: 33.2 g/dL (ref 30.0–36.0)
MCV: 93.9 fL (ref 80.0–100.0)
Platelets: 59 10*3/uL — ABNORMAL LOW (ref 150–400)
RBC: 3.11 MIL/uL — ABNORMAL LOW (ref 4.22–5.81)
RDW: 13.2 % (ref 11.5–15.5)
WBC: 29.7 10*3/uL — ABNORMAL HIGH (ref 4.0–10.5)
nRBC: 0 % (ref 0.0–0.2)

## 2024-01-08 LAB — BASIC METABOLIC PANEL
Anion gap: 11 (ref 5–15)
BUN: 20 mg/dL (ref 8–23)
CO2: 23 mmol/L (ref 22–32)
Calcium: 7.6 mg/dL — ABNORMAL LOW (ref 8.9–10.3)
Chloride: 95 mmol/L — ABNORMAL LOW (ref 98–111)
Creatinine, Ser: 1.38 mg/dL — ABNORMAL HIGH (ref 0.61–1.24)
GFR, Estimated: 58 mL/min — ABNORMAL LOW (ref >60)
Glucose, Bld: 137 mg/dL — ABNORMAL HIGH (ref 70–99)
Potassium: 3.5 mmol/L (ref 3.5–5.1)
Sodium: 129 mmol/L — ABNORMAL LOW (ref 135–145)

## 2024-01-08 LAB — RESPIRATORY PANEL BY PCR

## 2024-01-08 LAB — LACTIC ACID, PLASMA
Lactic Acid, Venous: 1.2 mmol/L (ref 0.5–1.9)
Lactic Acid, Venous: 1.2 mmol/L (ref 0.5–1.9)

## 2024-01-08 LAB — HIV ANTIBODY (ROUTINE TESTING W REFLEX): HIV Screen 4th Generation wRfx: NONREACTIVE

## 2024-01-08 MED ORDER — FLUTICASONE PROPIONATE 50 MCG/ACT NA SUSP
2.0000 | Freq: Every day | NASAL | Status: DC
  Filled 2024-01-08 (×2): qty 16

## 2024-01-08 MED ORDER — LORAZEPAM 2 MG/ML IJ SOLN
0.0000 mg | Freq: Four times a day (QID) | INTRAMUSCULAR | Status: DC
  Administered 2024-01-08: 2 mg via INTRAVENOUS
  Filled 2024-01-08: qty 1

## 2024-01-08 MED ORDER — MIRTAZAPINE 15 MG PO TABS
15.0000 mg | ORAL_TABLET | Freq: Every day | ORAL | Status: DC
  Administered 2024-01-08 – 2024-01-09 (×3): 15 mg via ORAL
  Filled 2024-01-08 (×3): qty 1

## 2024-01-08 MED ORDER — FOLIC ACID 1 MG PO TABS
1.0000 mg | ORAL_TABLET | Freq: Every day | ORAL | Status: DC
  Administered 2024-01-08 – 2024-01-10 (×3): 1 mg via ORAL
  Filled 2024-01-08 (×3): qty 1

## 2024-01-08 MED ORDER — LORAZEPAM 1 MG PO TABS: 1.0000 mg | ORAL_TABLET | ORAL | Status: DC | PRN

## 2024-01-08 MED ORDER — FINASTERIDE 5 MG PO TABS
5.0000 mg | ORAL_TABLET | Freq: Every day | ORAL | Status: DC
Start: 2024-01-08 — End: 2024-01-10
  Administered 2024-01-08 – 2024-01-10 (×3): 5 mg via ORAL
  Filled 2024-01-08 (×3): qty 1

## 2024-01-08 MED ORDER — LORAZEPAM 2 MG/ML IJ SOLN
1.0000 mg | INTRAMUSCULAR | Status: DC | PRN
  Administered 2024-01-09: 1 mg via INTRAVENOUS
  Filled 2024-01-08: qty 1

## 2024-01-08 MED ORDER — LORAZEPAM 2 MG/ML IJ SOLN: 0.0000 mg | Freq: Two times a day (BID) | INTRAMUSCULAR | Status: DC

## 2024-01-08 MED ORDER — CLONAZEPAM 0.5 MG PO TABS
0.5000 mg | ORAL_TABLET | Freq: Every evening | ORAL | Status: DC
  Administered 2024-01-08 – 2024-01-09 (×2): 0.5 mg via ORAL
  Filled 2024-01-08 (×2): qty 1

## 2024-01-08 MED ORDER — METOPROLOL SUCCINATE ER 50 MG PO TB24
100.0000 mg | ORAL_TABLET | Freq: Every day | ORAL | Status: DC
  Administered 2024-01-09 – 2024-01-10 (×2): 100 mg via ORAL
  Filled 2024-01-08 (×3): qty 2

## 2024-01-08 MED ORDER — BUPROPION HCL ER (XL) 300 MG PO TB24
300.0000 mg | ORAL_TABLET | Freq: Every day | ORAL | Status: DC
  Administered 2024-01-08 – 2024-01-10 (×3): 300 mg via ORAL
  Filled 2024-01-08: qty 1
  Filled 2024-01-08: qty 2
  Filled 2024-01-08: qty 1

## 2024-01-08 MED ORDER — THIAMINE MONONITRATE 100 MG PO TABS
100.0000 mg | ORAL_TABLET | Freq: Every day | ORAL | Status: DC
  Administered 2024-01-08 – 2024-01-10 (×3): 100 mg via ORAL
  Filled 2024-01-08 (×3): qty 1

## 2024-01-08 MED ORDER — ADULT MULTIVITAMIN W/MINERALS CH
1.0000 | ORAL_TABLET | Freq: Every day | ORAL | Status: DC
  Administered 2024-01-08 – 2024-01-10 (×3): 1 via ORAL
  Filled 2024-01-08 (×3): qty 1

## 2024-01-08 MED ORDER — ATORVASTATIN CALCIUM 20 MG PO TABS
80.0000 mg | ORAL_TABLET | Freq: Every day | ORAL | Status: DC
  Administered 2024-01-08 – 2024-01-10 (×3): 80 mg via ORAL
  Filled 2024-01-08 (×3): qty 4

## 2024-01-08 MED ORDER — LENALIDOMIDE 15 MG PO CAPS: 15.0000 mg | ORAL_CAPSULE | Freq: Every day | ORAL | Status: DC

## 2024-01-08 MED ORDER — MONTELUKAST SODIUM 10 MG PO TABS
10.0000 mg | ORAL_TABLET | Freq: Every day | ORAL | Status: DC
  Administered 2024-01-08 – 2024-01-10 (×3): 10 mg via ORAL
  Filled 2024-01-08 (×3): qty 1

## 2024-01-08 MED ORDER — DULOXETINE HCL 30 MG PO CPEP
60.0000 mg | ORAL_CAPSULE | Freq: Every evening | ORAL | Status: DC
Start: 2024-01-08 — End: 2024-01-10
  Administered 2024-01-08 – 2024-01-09 (×2): 60 mg via ORAL
  Filled 2024-01-08 (×2): qty 2

## 2024-01-08 MED ORDER — THIAMINE HCL 100 MG/ML IJ SOLN: 100.0000 mg | Freq: Every day | INTRAMUSCULAR | Status: DC

## 2024-01-08 MED ORDER — NALTREXONE HCL 50 MG PO TABS
50.0000 mg | ORAL_TABLET | Freq: Every day | ORAL | Status: DC
  Administered 2024-01-08 – 2024-01-10 (×3): 50 mg via ORAL
  Filled 2024-01-08 (×3): qty 1

## 2024-01-08 MED ORDER — TENOFOVIR ALAFENAMIDE FUMARATE 25 MG PO TABS
25.0000 mg | ORAL_TABLET | Freq: Every day | ORAL | Status: DC
Start: 2024-01-08 — End: 2024-01-10
  Administered 2024-01-08 – 2024-01-10 (×3): 25 mg via ORAL
  Filled 2024-01-08 (×3): qty 30

## 2024-01-08 NOTE — ED Notes (Signed)
 Oxygen did start to decrease on RA to 91%. Pt placed back on oxygen to 2L and is at 96%

## 2024-01-08 NOTE — ED Notes (Signed)
 RN called for labs, have informed phlebotomist Megan twice. Called her at [phone]. If not completed please call her directly.

## 2024-01-08 NOTE — ED Notes (Signed)
 Pt monitor alarming for low O2 sats. Upon entering rm pt is resting on his side, RR even and non-labored. O2 rate increased to 3L. Pt sating at 97% on 3L [place].

## 2024-01-08 NOTE — Progress Notes (Addendum)
 [facility]  PROGRESS NOTE  Russell Simpson FMW:[id number] DOB: [birth year] DOA: 01/07/2024 PCP: Russell Pao, NP   Brief HPI:    63 y.o. 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  No chest pain, SOB, cough.  No nausea, vomiting, diarrhea or abdominal pain.  No symptoms of UTI.   WBC 24.4, lactic acid 2.6 --> 2.9, procalcitonin 1.12 troponin 12--> 2, BNP 55.6, negative urinalysis, negative strep A, negative chest x-ray.    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 - Patient is following up with Russell Simpson. -Continue home lenalidomide  (patient is concerned about the cost of this medication)..   Will allow patient to take his home medication.   Chronic diastolic CHF (congestive heart failure) (HCC): 2D echo on 08/09/2020 showed EF acetic acid 5% with grade 1 diastolic dysfunction.  BNP normal 55.6.  No leg edema.  CHF is compensated. -Watch volume status closely   Essential hypertension -IV hydralazine  as needed -Hold amlodipine  since patient is at high risk of developing hypotension due to SIRS -Continue metoprolol    AKI (acute kidney injury) (HCC):  Baseline creatinine 0.86 on 12/27/2023.  His creatinine is 1.  44, GFR 58,  BNP 19.   May be due to dehydration. IV fluid as above -Avoid using renal toxic medications.   Hyperlipidemia -Lipitor    Benign prostatic hyperplasia with lower urinary tract symptoms -Proscar    Alcohol abuse -CIWA protocol -Did counseling about importance of cutting down his drinking   Mixed bipolar I disorder (HCC) -Wellbutrin , Cymbalta , Klonopin , Remeron    Obesity with body mass index (BMI) of 30.0 to 39.9: Body weight 108.9 kg, BMI 30.81 -Encourage losing weight -Exercise healthy diet   Hyponatremia -Mild, sodium 129 -Likely in setting of poor p.o. intake -Follow BMP in am   Medications     alteplase   2 mg Intracatheter Once   atorvastatin   80 mg Oral Daily   buPROPion   300 mg Oral Daily   Chlorhexidine  Gluconate Cloth  6 each Topical Daily   clonazePAM   0.5 mg Oral QPM   DULoxetine   60 mg Oral QPM   finasteride   5 mg Oral Daily   fluticasone   2 spray Each Nare Daily   folic acid   1 mg Oral Daily   lenalidomide   15 mg Oral Daily   LORazepam   0-4 mg Intravenous Q6H   Followed by   Russell Simpson ON 01/10/2024] LORazepam   0-4 mg Intravenous Q12H   metoprolol  succinate  100 mg Oral Daily   mirtazapine   15 mg Oral QHS   montelukast   10 mg Oral Daily   multivitamin with minerals  1 tablet Oral Daily   naltrexone   50 mg Oral Daily   sodium chloride  flush  10-40 mL Intracatheter Q12H   sodium chloride  flush  10-40 mL Intracatheter Q12H   sodium chloride   1 g Oral BID WC   tenofovir  alafenamide  25 mg  Oral Daily   thiamine   100 mg Oral Daily   Or   thiamine   100 mg Intravenous Daily     Data Reviewed:   CBG:  No results for input(s): GLUCAP in the last 168 hours.  SpO2: 90 % O2 Flow Rate (L/min): 2 L/min    Vitals:   01/08/24 0500 01/08/24 0700 01/08/24 0844 01/08/24 0846  BP: 110/73 120/71 (!) 122/46   Pulse: (!) 121 (!) 122 (!) 137   Resp: 18 20    Temp: 98.2 F (36.8 C)   98.8 F (37.1 C)  TempSrc: Oral   Oral  SpO2: 94% 90%    Weight:       Height:          Data Reviewed:  Basic Metabolic Panel: Recent Labs  Lab 01/07/24 1409 01/08/24 0157  NA 129* 129*  K 4.4 3.5  CL 92* 95*  CO2 23 23  GLUCOSE 136* 137*  BUN 19 20  CREATININE 1.44* 1.38*  CALCIUM  8.4* 7.6*    CBC: Recent Labs  Lab 01/07/24 1409 01/08/24 0157  WBC 24.4*  23.9* 29.7*  NEUTROABS 4.1  --   HGB 11.8*  11.7* 9.7*  HCT 34.7*  35.0* 29.2*  MCV 92.0  93.3 93.9  PLT 69*  70* 59*    LFT Recent Labs  Lab 01/07/24 1409  AST 27  ALT 21  ALKPHOS 81  BILITOT 1.3*  PROT 5.4*  ALBUMIN 3.1*     Antibiotics: Anti-infectives (From admission, onward)    Start     Dose/Rate Route Frequency Ordered Stop   01/08/24 2100  vancomycin  (VANCOREADY) IVPB 1500 mg/300 mL        1,500 mg 150 mL/hr over 120 Minutes Intravenous Every 24 hours 01/07/24 2052     01/08/24 1000  tenofovir  alafenamide (VEMLIDY ) tablet 25 mg        25 mg Oral Daily 01/08/24 0035     01/08/24 0100  ceFEPIme  (MAXIPIME ) 2 g in sodium chloride  0.9 % 100 mL IVPB        2 g 200 mL/hr over 30 Minutes Intravenous Every 8 hours 01/07/24 2011     01/07/24 1630  metroNIDAZOLE  (FLAGYL ) IVPB 500 mg        500 mg 100 mL/hr over 60 Minutes Intravenous  Once 01/07/24 1616 01/07/24 1902   01/07/24 1615  ceFEPIme  (MAXIPIME ) 2 g in sodium chloride  0.9 % 100 mL IVPB        2 g 200 mL/hr over 30 Minutes Intravenous  Once 01/07/24 1609 01/07/24 1758   01/07/24 1615  vancomycin  (VANCOREADY) IVPB 2000 mg/400 mL        2,000 mg 200 mL/hr over 120 Minutes Intravenous  Once 01/07/24 1609 01/07/24 2317        DVT prophylaxis: SCDs  Code Status: Full code  Family Communication: No family at bedside   CONSULTS    Subjective   Denies shortness of breath.   Objective    Physical Examination:   General-appears in no acute distress Heart-S1-S2, regular, no murmur auscultated Lungs-clear to auscultation bilaterally, no wheezing or crackles auscultated Abdomen-soft,  nontender, no organomegaly Extremities-no edema in the lower extremities Neuro-alert, oriented x3, no focal deficit noted  Status is: Inpatient:          Russell Simpson   [facility] If 7PM-7AM, please contact night-coverage at [email or web address], Office  [phone]   01/08/2024, 9:28 AM  LOS: 1 day

## 2024-01-09 DIAGNOSIS — N179 Acute kidney failure, unspecified: Secondary | ICD-10-CM | POA: Diagnosis not present

## 2024-01-09 DIAGNOSIS — R6510 Systemic inflammatory response syndrome (SIRS) of non-infectious origin without acute organ dysfunction: Secondary | ICD-10-CM | POA: Diagnosis not present

## 2024-01-09 DIAGNOSIS — A419 Sepsis, unspecified organism: Secondary | ICD-10-CM | POA: Diagnosis not present

## 2024-01-09 DIAGNOSIS — N170 Acute kidney failure with tubular necrosis: Principal | ICD-10-CM

## 2024-01-09 DIAGNOSIS — I5032 Chronic diastolic (congestive) heart failure: Secondary | ICD-10-CM | POA: Diagnosis not present

## 2024-01-09 LAB — COMPREHENSIVE METABOLIC PANEL
ALT: 16 U/L (ref 0–44)
AST: 13 U/L — ABNORMAL LOW (ref 15–41)
Albumin: 2.6 g/dL — ABNORMAL LOW (ref 3.5–5.0)
Alkaline Phosphatase: 66 U/L (ref 38–126)
Anion gap: 10 (ref 5–15)
BUN: 11 mg/dL (ref 8–23)
CO2: 21 mmol/L — ABNORMAL LOW (ref 22–32)
Calcium: 7.2 mg/dL — ABNORMAL LOW (ref 8.9–10.3)
Chloride: 103 mmol/L (ref 98–111)
Creatinine, Ser: 1 mg/dL (ref 0.61–1.24)
GFR, Estimated: >60 mL/min (ref >60)
Glucose, Bld: 125 mg/dL — ABNORMAL HIGH (ref 70–99)
Potassium: 3.3 mmol/L — ABNORMAL LOW (ref 3.5–5.1)
Sodium: 134 mmol/L — ABNORMAL LOW (ref 135–145)
Total Bilirubin: 1.3 mg/dL — ABNORMAL HIGH (ref 0.0–1.2)
Total Protein: 4.6 g/dL — ABNORMAL LOW (ref 6.5–8.1)

## 2024-01-09 LAB — CBC
HCT: 28.4 % — ABNORMAL LOW (ref 39.0–52.0)
Hemoglobin: 9.6 g/dL — ABNORMAL LOW (ref 13.0–17.0)
MCH: 31.1 pg (ref 26.0–34.0)
MCHC: 33.8 g/dL (ref 30.0–36.0)
MCV: 91.9 fL (ref 80.0–100.0)
Platelets: 53 10*3/uL — ABNORMAL LOW (ref 150–400)
RBC: 3.09 MIL/uL — ABNORMAL LOW (ref 4.22–5.81)
RDW: 13.3 % (ref 11.5–15.5)
WBC: 31.2 10*3/uL — ABNORMAL HIGH (ref 4.0–10.5)
nRBC: 0 % (ref 0.0–0.2)

## 2024-01-09 LAB — URINE CULTURE: Culture: NO GROWTH

## 2024-01-09 MED ORDER — POTASSIUM CHLORIDE 20 MEQ PO PACK
40.0000 meq | PACK | Freq: Once | ORAL | Status: AC
  Administered 2024-01-09: 40 meq via ORAL
  Filled 2024-01-09: qty 2

## 2024-01-09 MED ORDER — VANCOMYCIN HCL 1500 MG/300ML IV SOLN
1500.0000 mg | Freq: Two times a day (BID) | INTRAVENOUS | Status: DC
Start: 2024-01-09 — End: 2024-01-10
  Administered 2024-01-09 – 2024-01-10 (×2): 1500 mg via INTRAVENOUS
  Filled 2024-01-09 (×3): qty 300

## 2024-01-09 NOTE — Progress Notes (Signed)
 [facility]  PROGRESS NOTE  Russell Simpson FMW:[id number] DOB: March 16, 1961 DOA: 01/07/2024 PCP: Melvin Pao, NP   Brief HPI:    63 y.o. 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  No chest pain, SOB, cough.  No nausea, vomiting, diarrhea or abdominal pain.  No symptoms of UTI.   WBC 24.4, lactic acid 2.6 --> 2.9, procalcitonin 1.12 troponin 12--> 2, BNP 55.6, negative urinalysis, negative strep A, negative chest x-ray.    Assessment/Plan:   SIRS (systemic inflammatory response syndrome) (HCC):  Presented with tachycardia, lactic acid 2.6-2.9, procalcitonin 1.12 -No clear source of infection, urine and blood cultures are negative to date -Because of immunosuppression, started on antibiotics -Continue vancomycin , cefepime  -Respiratory virus panel was unremarkable -WBC still elevated at 31,000; follow CBC in a.m.  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 - Patient is following up with Dr. Melanee -Will hold patient's home chemotherapy  lenalidomide  -Discussed with Dr. Jacobo, who says okay to hold for few days.      Chronic diastolic CHF (congestive heart failure) (HCC): 2D echo on 08/09/2020 showed EF acetic acid 5% with grade 1 diastolic dysfunction.  BNP normal 55.6.  No leg edema.  CHF is compensated. -Watch volume status closely   Essential hypertension -IV hydralazine  as needed -Hold amlodipine  since patient is at high risk of developing hypotension due to SIRS -Continue metoprolol    AKI (acute kidney  injury) (HCC):  Baseline creatinine 0.86 on 12/27/2023.  His creatinine is 1. 44, GFR 58, BNP 19.   Resolved with IV fluids  Hypokalemia -Potassium is 3.3 -Replace potassium and follow BMP in am   Hyperlipidemia -Lipitor    Benign prostatic hyperplasia with lower urinary tract symptoms -Proscar    Alcohol abuse -CIWA protocol -Did counseling about importance of cutting down his drinking   Mixed bipolar I disorder (HCC) -Wellbutrin , Cymbalta , Klonopin , Remeron    Obesity with body mass index (BMI) of 30.0 to 39.9: Body weight 108.9 kg, BMI 30.81 -Encourage losing weight -Exercise healthy diet   Hyponatremia -Improved, today sodium 134 -Will discontinue fluid restrictions.   Medications     alteplase   2 mg Intracatheter Once   atorvastatin   80 mg Oral Daily   buPROPion   300 mg Oral Daily   Chlorhexidine  Gluconate Cloth  6 each Topical Daily   clonazePAM   0.5 mg Oral QPM   DULoxetine   60 mg Oral QPM   finasteride   5 mg Oral Daily   fluticasone   2 spray Each Nare Daily   folic acid   1 mg Oral Daily   LORazepam   0-4 mg Intravenous Q6H   Followed by   NOREEN ON 01/10/2024] LORazepam   0-4 mg Intravenous Q12H   metoprolol  succinate  100 mg Oral Daily   mirtazapine   15 mg Oral QHS   montelukast   10 mg Oral Daily   multivitamin with minerals  1 tablet Oral Daily   naltrexone   50 mg Oral Daily   sodium chloride  flush  10-40 mL Intracatheter Q12H   sodium chloride  flush  10-40 mL Intracatheter Q12H   sodium chloride   1 g Oral BID WC  tenofovir  alafenamide  25 mg Oral Daily   thiamine   100 mg Oral Daily   Or   thiamine   100 mg Intravenous Daily     Data Reviewed:   CBG:  No results for input(s): GLUCAP in the last 168 hours.  SpO2: 95 % O2 Flow Rate (L/min): 2 L/min    Vitals:   01/08/24 2301 01/09/24 0235 01/09/24 0500 01/09/24 0646  BP: 132/68 138/79  (!) 154/94  Pulse: (!) 110 (!) 108  (!) 106  Resp: 20 18  20   Temp: 97.9 F (36.6 C) (!) 97.5 F (36.4  C)  97.8 F (36.6 C)  TempSrc:      SpO2: 96% 95%  95%  Weight:   107.8 kg   Height:          Data Reviewed:  Basic Metabolic Panel: Recent Labs  Lab 01/07/24 1409 01/08/24 0157 01/09/24 0545  NA 129* 129* 134*  K 4.4 3.5 3.3*  CL 92* 95* 103  CO2 23 23 21*  GLUCOSE 136* 137* 125*  BUN 19 20 11   CREATININE 1.44* 1.38* 1.00  CALCIUM  8.4* 7.6* 7.2*    CBC: Recent Labs  Lab 01/07/24 1409 01/08/24 0157 01/09/24 0545  WBC 24.4*  23.9* 29.7* 31.2*  NEUTROABS 4.1  --   --   HGB 11.8*  11.7* 9.7* 9.6*  HCT 34.7*  35.0* 29.2* 28.4*  MCV 92.0  93.3 93.9 91.9  PLT 69*  70* 59* 53*    LFT Recent Labs  Lab 01/07/24 1409 01/09/24 0545  AST 27 13*  ALT 21 16  ALKPHOS 81 66  BILITOT 1.3* 1.3*  PROT 5.4* 4.6*  ALBUMIN 3.1* 2.6*     Antibiotics: Anti-infectives (From admission, onward)    Start     Dose/Rate Route Frequency Ordered Stop   01/08/24 2100  vancomycin  (VANCOREADY) IVPB 1500 mg/300 mL        1,500 mg 150 mL/hr over 120 Minutes Intravenous Every 24 hours 01/07/24 2052     01/08/24 1000  tenofovir  alafenamide (VEMLIDY ) tablet 25 mg        25 mg Oral Daily 01/08/24 0035     01/08/24 0100  ceFEPIme  (MAXIPIME ) 2 g in sodium chloride  0.9 % 100 mL IVPB        2 g 200 mL/hr over 30 Minutes Intravenous Every 8 hours 01/07/24 2011     01/07/24 1630  metroNIDAZOLE  (FLAGYL ) IVPB 500 mg        500 mg 100 mL/hr over 60 Minutes Intravenous  Once 01/07/24 1616 01/07/24 1902   01/07/24 1615  ceFEPIme  (MAXIPIME ) 2 g in sodium chloride  0.9 % 100 mL IVPB        2 g 200 mL/hr over 30 Minutes Intravenous  Once 01/07/24 1609 01/07/24 1758   01/07/24 1615  vancomycin  (VANCOREADY) IVPB 2000 mg/400 mL        2,000 mg 200 mL/hr over 120 Minutes Intravenous  Once 01/07/24 1609 01/07/24 2317        DVT prophylaxis: SCDs  Code Status: Full code  Family Communication: No family at bedside   CONSULTS    Subjective      Objective    Physical  Examination:     Status is: Inpatient:          [name]   [facility] If 7PM-7AM, please contact night-coverage at [email or web address], Office  [phone]   01/09/2024, 7:55 AM  LOS: 2 days

## 2024-01-09 NOTE — Consult Note (Signed)
 Pharmacy Antibiotic Note  Russell Simpson is a 63 y.o. male admitted on 01/07/2024 with sepsis. PMH includes stage IV low-grade follicular lymphoma, HTN, HLD undergoing chemotherapy.  Pharmacy has been consulted for Vancomycin  and cefepime  dosing.   -f/u sepsis source?   -Currently immunosuppressed-on chemo, last chemo dose 12/28/23  Plan: Scr 1.44 >> 1.38 >> 1.00 - Continue Cefepime  2g IV q 8 hours - adjust Vancomycin  from 1500 mg Q24hrs to Q12hr.  Goal AUC 400-550. Expected AUC: 487 Expected Cmin:13.7 SCr used: 1.00  Will continue to monitor cultures and renal function to adjust therapy as needed.  Height: 6' 2 (188 cm) Weight: 107.8 kg (237 lb 10.5 oz) IBW/kg (Calculated) : 82.2  Temp (24hrs), Avg:98.1 F (36.7 C), Min:97.5 F (36.4 C), Max:98.8 F (37.1 C)  Recent Labs  Lab 01/07/24 1409 01/07/24 1754 01/07/24 2145 01/08/24 0109 01/08/24 0157 01/09/24 0545  WBC 24.4*  23.9*  --   --   --  29.7* 31.2*  CREATININE 1.44*  --   --   --  1.38* 1.00  LATICACIDVEN 2.9* 2.6* 1.8 1.2 1.2  --     Estimated Creatinine Clearance: 101.4 mL/min (by C-G formula based on SCr of 1 mg/dL).    Allergies  Allergen Reactions   Penicillins Anaphylaxis    Tolerates cefdinir , cephalexin and amoxicillin/clavulonate so true PCN allergy unlikely   Tetanus Toxoids Swelling   Lisinopril Cough   Losartan      Other reaction(s): Muscle Pain     Codeine Nausea Only and Nausea And Vomiting   Doxycycline  Rash   Ruxience  [Rituximab -Pvvr] Nausea And Vomiting, Rash and Other (See Comments)    05/06/21 pt w/ worsening rash during Ruxience  infusion.  Face, neck and chest flushing redness  12/27/2023: flushing, diaphoresis, nausea and vomiting    Antimicrobials this admission: Vancomycin  2/7 >>  Cefepime  2/7 >>   Dose adjustments this admission: n/a  Microbiology results: 2/7 BCx: NGx2d 2/7 throat swab: Group a strep negative 2/7 Ucx: NGTD   Thank you for allowing pharmacy to be a part  of this patient's care.  Allean Haas PharmD Clinical Pharmacist 01/09/2024

## 2024-01-09 NOTE — Plan of Care (Signed)
  Problem: Education: Goal: Knowledge of General Education information will improve Description: Including pain rating scale, medication(s)/side effects and non-pharmacologic comfort measures Outcome: Progressing   Problem: Health Behavior/Discharge Planning: Goal: Ability to manage health-related needs will improve Outcome: Progressing   Problem: Clinical Measurements: Goal: Ability to maintain clinical measurements within normal limits will improve Outcome: Progressing   Problem: Activity: Goal: Risk for activity intolerance will decrease Outcome: Progressing   Problem: Nutrition: Goal: Adequate nutrition will be maintained Outcome: Progressing   Problem: Coping: Goal: Level of anxiety will decrease Outcome: Progressing   Problem: Elimination: Goal: Will not experience complications related to bowel motility Outcome: Progressing Goal: Will not experience complications related to urinary retention Outcome: Progressing   Problem: Pain Managment: Goal: General experience of comfort will improve and/or be controlled Outcome: Progressing   Problem: Safety: Goal: Ability to remain free from injury will improve Outcome: Progressing   Problem: Skin Integrity: Goal: Risk for impaired skin integrity will decrease Outcome: Progressing

## 2024-01-09 NOTE — Plan of Care (Signed)
   Problem: Education: Goal: Knowledge of General Education information will improve Description Including pain rating scale, medication(s)/side effects and non-pharmacologic comfort measures Outcome: Progressing

## 2024-01-10 ENCOUNTER — Encounter: Payer: Self-pay | Admitting: Oncology

## 2024-01-10 ENCOUNTER — Other Ambulatory Visit: Payer: Self-pay | Admitting: Nurse Practitioner

## 2024-01-10 ENCOUNTER — Inpatient Hospital Stay: Payer: Medicare HMO

## 2024-01-10 ENCOUNTER — Other Ambulatory Visit: Payer: Medicare HMO

## 2024-01-10 ENCOUNTER — Telehealth: Payer: Self-pay | Admitting: Nurse Practitioner

## 2024-01-10 ENCOUNTER — Inpatient Hospital Stay: Payer: Medicare HMO | Admitting: Nurse Practitioner

## 2024-01-10 ENCOUNTER — Ambulatory Visit: Payer: Medicare HMO

## 2024-01-10 ENCOUNTER — Telehealth: Payer: Self-pay | Admitting: Oncology

## 2024-01-10 DIAGNOSIS — C8202 Follicular lymphoma grade I, intrathoracic lymph nodes: Secondary | ICD-10-CM

## 2024-01-10 DIAGNOSIS — E876 Hypokalemia: Secondary | ICD-10-CM

## 2024-01-10 DIAGNOSIS — R6510 Systemic inflammatory response syndrome (SIRS) of non-infectious origin without acute organ dysfunction: Secondary | ICD-10-CM | POA: Diagnosis not present

## 2024-01-10 LAB — COMPREHENSIVE METABOLIC PANEL
ALT: 24 U/L (ref 0–44)
AST: 17 U/L (ref 15–41)
Albumin: 3 g/dL — ABNORMAL LOW (ref 3.5–5.0)
Alkaline Phosphatase: 75 U/L (ref 38–126)
Anion gap: 11 (ref 5–15)
BUN: 10 mg/dL (ref 8–23)
CO2: 24 mmol/L (ref 22–32)
Calcium: 8.2 mg/dL — ABNORMAL LOW (ref 8.9–10.3)
Chloride: 99 mmol/L (ref 98–111)
Creatinine, Ser: 0.91 mg/dL (ref 0.61–1.24)
GFR, Estimated: >60 mL/min (ref >60)
Glucose, Bld: 119 mg/dL — ABNORMAL HIGH (ref 70–99)
Potassium: 3.7 mmol/L (ref 3.5–5.1)
Sodium: 134 mmol/L — ABNORMAL LOW (ref 135–145)
Total Bilirubin: 0.9 mg/dL (ref 0.0–1.2)
Total Protein: 5.5 g/dL — ABNORMAL LOW (ref 6.5–8.1)

## 2024-01-10 LAB — CBC
HCT: 33.4 % — ABNORMAL LOW (ref 39.0–52.0)
Hemoglobin: 11.4 g/dL — ABNORMAL LOW (ref 13.0–17.0)
MCH: 31.1 pg (ref 26.0–34.0)
MCHC: 34.1 g/dL (ref 30.0–36.0)
MCV: 91.3 fL (ref 80.0–100.0)
Platelets: 62 10*3/uL — ABNORMAL LOW (ref 150–400)
RBC: 3.66 MIL/uL — ABNORMAL LOW (ref 4.22–5.81)
RDW: 13.2 % (ref 11.5–15.5)
WBC: 31.5 10*3/uL — ABNORMAL HIGH (ref 4.0–10.5)
nRBC: 0 % (ref 0.0–0.2)

## 2024-01-10 MED ORDER — HEPARIN SOD (PORK) LOCK FLUSH 100 UNIT/ML IV SOLN: 500.0000 [IU] | Freq: Once | INTRAVENOUS | Status: DC

## 2024-01-10 MED ORDER — CEFUROXIME AXETIL 500 MG PO TABS: 500.0000 mg | ORAL_TABLET | Freq: Two times a day (BID) | ORAL | 0 refills | Status: AC

## 2024-01-10 MED ORDER — CEFUROXIME AXETIL 500 MG PO TABS
500.0000 mg | ORAL_TABLET | Freq: Two times a day (BID) | ORAL | Status: DC
  Filled 2024-01-10 (×2): qty 1

## 2024-01-10 NOTE — Discharge Instructions (Signed)
 Intensive Outpatient Programs   [facility] [address] [place],  [place], [place] [phone]  [facility] (Inpatient and outpatient)[phone] (Suboxone and Methadone) [address] [phone]  [facility]: [facility] Programs - Intensive Outpatient [address] [place], [place] [address], [place]  [phone]  [facility] (Outpatient, Inpatient, [facility] (Groups and Residental) (insurance only) [phone] [place], [place] [phone]   [facility] (for caregivers and family) [address] Laurell Josephs [address], [place], [place] [phone]  Residential Treatment Programs  [facility](2 years) Residential: 43 days)[facility] [address] [facility] [address] [place], [place], [place] [phone] or [phone]  [facility] [address] [place], [place], [place] [phone]  [facility] ([facility]) [address] [place], [place], [place] [phone] Admissions: 8am-3pm M-F  [facility] Program: Residential Program [phone] Days)             [facility]: [facility]  [place], [place], [place]  [phone] or [phone] in Hours over the weekend or by referral)  [facility] [address] [place], [place] [address] [phone] (Do virtual or phone assessment, offer transportation within 25 miles, have in patient and Outpatient options)   Mobil Crisis: [facility]:[phone] (for crisis  response 24 hours a day)     the [facility] offers a [employer] that anyone can call toll free at [phone]. The friendship line is available 24 hours a day  [facility] is a Program of All-inclusive Care for the Elderly (PACE). Their mission is to promote and sustain the independence of seniors wishing to remain in the community. They provide seniors with comprehensive long-term health, social, medical and dietary care. Their program is a safe alternative to nursing home care. [phone]  [facility] Physical Address [facility] [address] [place], [place] [address] Phone: [phone]. . Online zoom yoga class, connect with others without leaving your home [facility] offers [place] dance cardio sessions for individuals via Zoom. This program provides: - Dance fitness activities Please contact program for more information. Servinganyone in need adults 18+ hiv/aids individuals families Call [phone]  Email [email or web address] [email or web address] to get more info  [employer] offers an online [employer] to individuals where they can receive help to focus on their best health. Whether you're a [employer] member or not, the neighborhood center offers a... Main Serviceshealth education  exercise & fitness  community support services  recreation  virtual support Other Servicessupport groups Servinganyone in need adults young adults teens seniors individuals families [email or web address] to get more info  Schedule on their website  [facility] offers an array of activities for adults age 3 and over. This program provides:- Fitness and health programs- Tech classes- Activity books Main Serviceshealth education  community support services  exercise & fitness  recreation  more education Servingseniors  Call [phone]    For more resources go online to [email or web address] and type in you  zipcode

## 2024-01-10 NOTE — Telephone Encounter (Signed)
 Inpatient RN called to make us  aware that patient is being d/c home today. Per instructions, he is to follow-up with Dr. Randy Buttery in 1 wk.   Dr. Mardene Shake advise.

## 2024-01-10 NOTE — Telephone Encounter (Signed)
 He will see me on 2/17 as planned

## 2024-01-10 NOTE — TOC Transition Note (Signed)
 Transition of Care [facility]) - Discharge Note   Patient Details  Name: Russell Simpson MRN: [id number] Date of Birth: February 01, 1961  [facility]) CM/SW Contact:  Odilia Bennett, LCSW Phone Number: 01/10/2024, 10:02 AM   Clinical Narrative:   Patient has orders to discharge home today. TOC consult for SA resources. CSW added resources to AVS. SDOH flag for social isolation. CSW added resources to AVS. No further concerns. CSW signing off.  Final next level of care: Home/Self Care Barriers to Discharge: No Barriers Identified   Patient Goals and CMS Choice            Discharge Placement                    Patient and family notified of of transfer: 01/10/24  Discharge Plan and Services Additional resources added to the After Visit Summary for                                       Social Drivers of Health (SDOH) Interventions SDOH Screenings   Food Insecurity: No Food Insecurity (01/08/2024)  Housing: Low Risk  (01/08/2024)  Transportation Needs: No Transportation Needs (01/08/2024)  Utilities: Not At Risk (01/08/2024)  Alcohol Screen: Low Risk  (04/05/2023)  Depression (PHQ2-9): High Risk (11/02/2023)  Financial Resource Strain: Medium Risk (04/05/2023)  Physical Activity: Inactive (01/01/2023)  Social Connections: Socially Isolated (04/05/2023)  Stress: No Stress Concern Present (04/05/2023)  Tobacco Use: Medium Risk (01/07/2024)  Health Literacy: Low Risk  (04/14/2023)   [facility]     Readmission Risk Interventions     No data to display

## 2024-01-10 NOTE — Discharge Summary (Signed)
 Physician Discharge Summary   Patient: Russell Simpson MRN: [id number] DOB: March 03, 1961  Admit date:     01/07/2024  Discharge date: 01/10/24  Discharge Physician: Melvinia Stager   PCP: Aileen Alexanders, NP   Recommendations at discharge:   follow-up Dr. 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 male with medical history significant of lymphoma on chemotherapy, former smoker, alcohol abuse, hypertension, hyperlipidemia, COPD not oxygen, asthma, gout, depression with anxiety, BPH, skin cancer, OSA not on CPAP, diastolic CHF, PTSD, bipolar, obesity, who presents with sore throat, malaise, body aches, generalized weakness    SIRS (systemic inflammatory response syndrome) (HCC): no source of infection identified could be viral Presented with tachycardia, lactic acid 2.6-2.9, procalcitonin 1.12 -No clear source of infection, urine and blood cultures are negative to date -Because of immunosuppression, started on empiric antibiotics--cefdinir  -Respiratory virus panel was unremarkable   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 - Patient is following up with Dr. Randy Buttery -Will hold patient's home chemotherapy  lenalidomide    Chronic diastolic CHF (congestive heart failure) (HCC): 2D echo on 08/09/2020 showed EF acetic acid 5% with grade 1 diastolic dysfunction.  BNP  normal 55.6.  No leg edema.  CHF is compensated.   Essential hypertension -Continue metoprolol    AKI (acute kidney injury) (HCC):  Baseline creatinine 0.86 on 12/27/2023.  His creatinine is 1. 44, GFR 58, BNP 19.    Hypokalemia -Potassium is 3.7   Hyperlipidemia -Lipitor    Benign prostatic hyperplasia with lower urinary tract symptoms -Proscar    Alcohol abuse -CIWA protocol--scoring 0   Mixed bipolar I disorder (HCC) -Wellbutrin , Cymbalta , Klonopin , Remeron    Obesity with body mass index (BMI) of 30.0 to 39.9: Body weight 108.9 kg, BMI 30.81 -Encourage losing weight -Exercise healthy diet   Hyponatremia -Improved, today sodium 134  Overall remains afebrile and hemodynamically stable. Patient is eager to go home discharge plan discussed with patient. She he will follow-up with Dr. Randy Buttery as outpatient. He is asked to hold his Revlimid  for now and complete his empiric antibiotic treatment. No family present in the room during my evaluation        Consultants: none Disposition: Home Diet recommendation:  Discharge Diet Orders (From admission, onward)     Start     Ordered   01/10/24 0000  Diet - low sodium heart healthy        01/10/24 0945            DISCHARGE MEDICATION: Allergies as of 01/10/2024       Reactions   Penicillins Anaphylaxis   Tolerates cefdinir , cephalexin and amoxicillin/clavulonate so true PCN allergy unlikely   Tetanus Toxoids Swelling   Lisinopril Cough   Losartan    Other reaction(s): Muscle Pain   Codeine Nausea Only, Nausea And Vomiting   Doxycycline  Rash   Ruxience  [rituximab -pvvr] Nausea And Vomiting, Rash, Other (  See Comments)   05/06/21 pt w/ worsening rash during Ruxience  infusion.  Face, neck and chest flushing redness 12/27/2023: flushing, diaphoresis, nausea and vomiting        Medication List     PAUSE taking these medications    lenalidomide  15 MG capsule Wait to take this until: January 18, 2024 Till you aqre seen  aat the cancer center and d/w dr Randy Buttery Commonly known as: REVLIMID  Take 1 capsule (15 mg total) by mouth daily. Take for 21 days, then hold for 7 days. Repeat every 28 days.       STOP taking these medications    amLODipine  2.5 MG tablet Commonly known as: NORVASC    Trelegy Ellipta  200-62.5-25 MCG/ACT Aepb Generic drug: Fluticasone -Umeclidin-Vilant       TAKE these medications    acetaminophen  325 MG tablet Commonly known as: Tylenol  Take 2 tablets (650 mg) by mouth starting 2 days prior to chemo then 3 days after chemotherapy. Do not take on day of chemotherapy   albuterol  108 (90 Base) MCG/ACT inhaler Commonly known as: VENTOLIN  HFA Inhale 2 puffs into the lungs every 6 (six) hours as needed for wheezing or shortness of breath.   atorvastatin  80 MG tablet Commonly known as: LIPITOR  Take 1 tablet (80 mg total) by mouth daily.   buPROPion  300 MG 24 hr tablet Commonly known as: WELLBUTRIN  XL Take 300 mg by mouth daily.   cefUROXime  500 MG tablet Commonly known as: CEFTIN  Take 1 tablet (500 mg total) by mouth 2 (two) times daily with a meal for 3 days.   cetirizine  10 MG tablet Commonly known as: ZYRTEC  Take 1 tablet (10 mg total) by mouth daily.   clonazePAM  0.5 MG tablet Commonly known as: KLONOPIN  Take 0.5 mg by mouth every evening.   diphenhydrAMINE  25 mg capsule Commonly known as: BENADRYL  Starting 2 days prior to your chemotherapy infusions, take 1 pill (25 mg) by mouth daily. Do not take on day of chemotherapy.   DULoxetine  60 MG capsule Commonly known as: CYMBALTA  Take 60 mg by mouth every evening.   famotidine  20 MG tablet Commonly known as: PEPCID  Starting 2 days prior to your chemotherapy infusions, take 1 pill (20 mg) by mouth daily. Do not take on day of chemotherapy.   finasteride  5 MG tablet Commonly known as: PROSCAR  TAKE 1 TABLET BY MOUTH ONCE DAILY   fluticasone  50 MCG/ACT nasal spray Commonly known as: FLONASE  Place 2 sprays into both  nostrils daily.   metoprolol  succinate 100 MG 24 hr tablet Commonly known as: TOPROL -XL TAKE 1 TABLET BY MOUTH ONCE DAILY WITH OR IMMEDIATELY FOLLOWING A MEAL   mirtazapine  15 MG tablet Commonly known as: REMERON  Take 15 mg by mouth at bedtime.   montelukast  10 MG tablet Commonly known as: SINGULAIR  TAKE 1 TABLET EVERY DAY   montelukast  10 MG tablet Commonly known as: Singulair  Starting 2 days prior to your chemotherapy infusions, take 1 pill (10 mg) by mouth at night. Do not take on day of chemotherapy.   naltrexone  50 MG tablet Commonly known as: DEPADE Take 50 mg by mouth daily.   ondansetron  4 MG tablet Commonly known as: Zofran  Take 1 tablet (4 mg total) by mouth every 8 (eight) hours as needed for nausea or vomiting.   ONE TOUCH ULTRA 2 w/Device Kit 1 each by Does not apply route daily.   OneTouch Ultra test strip Generic drug: glucose blood 1 each by Other route daily. Use as instructed   onetouch ultrasoft lancets  1 each by Other route daily. Use as instructed   predniSONE  50 MG tablet Commonly known as: DELTASONE  Take 1 tablet (50 mg) by mouth starting 2 days prior to chemo then 3 days after chemotherapy. Do not take on day of chemotherapy   sildenafil  100 MG tablet Commonly known as: VIAGRA  Take 1 tablet (100 mg total) by mouth daily as needed for erectile dysfunction. Take two hours prior to intercourse on an empty stomach   testosterone  cypionate 200 MG/ML injection Commonly known as: DEPOTESTOSTERONE CYPIONATE Inject 1 mL (200 mg total) into the muscle every 14 (fourteen) days. INJECT 1ML INTRAMUSCULARLY EVERY 14 DAYS   Vemlidy  25 MG tablet Generic drug: tenofovir  alafenamide Take 1 tablet (25 mg total) by mouth daily.        Follow-up Information     Aileen Alexanders, NP. Go on 01/13/2024.   Specialty: Nurse Practitioner Why: @1 :40pm Contact information: [address] [place] [address] [phone]         Avonne Boettcher, MD. Go  in 1 week(s).   Specialty: Oncology Why: office will reach out to patient and schedule appt. Contact information: [address] [place] [address] [phone]                 Condition at discharge: fair  The results of significant diagnostics from this hospitalization (including imaging, microbiology, ancillary and laboratory) are listed below for reference.   Imaging Studies: DG Chest Portable 1 View Result Date: 01/07/2024 CLINICAL DATA:  Generalized body ache and dizziness. History of non-Hodgkin lymphoma. EXAM: PORTABLE CHEST 1 VIEW COMPARISON:  04/06/2023 FINDINGS: No focal airspace consolidation, pulmonary edema, or pleural effusion. The cardiopericardial silhouette is within normal limits for size. Right-sided Port-A-Cath again noted with tip overlying the low right innominate vein, potentially just at the innominate vein confluence. No acute bony abnormality. IMPRESSION: 1. No acute cardiopulmonary findings. 2. Right-sided Port-A-Cath tip overlies the low right innominate vein, potentially just at the innominate vein confluence. Tip projects higher on today's study, likely secondary to the more lordotic positioning on the current exam. Electronically Signed   By: Donnal Fusi M.D.   On: 01/07/2024 17:04    Microbiology: Results for orders placed or performed during the hospital encounter of 01/07/24  Blood culture (routine x 2)     Status: None (Preliminary result)   Collection Time: 01/07/24  2:09 PM   Specimen: BLOOD  Result Value Ref Range Status   Specimen Description BLOOD LEFT ANTECUBITAL  Final   Special Requests   Final    BOTTLES DRAWN AEROBIC AND ANAEROBIC Blood Culture results may not be optimal due to an inadequate volume of blood received in culture bottles   Culture   Final    NO GROWTH 3 DAYS Performed at [facility], [address], [place], [place] [address]    Report Status PENDING  Incomplete  Resp panel by RT-PCR (RSV, Flu A&B,  Covid) Anterior Nasal Swab     Status: None   Collection Time: 01/07/24  2:15 PM   Specimen: Anterior Nasal Swab  Result Value Ref Range Status   SARS Coronavirus 2 by RT PCR NEGATIVE NEGATIVE Final    Comment: (NOTE) SARS-CoV-2 target nucleic acids are NOT DETECTED.  The SARS-CoV-2 RNA is generally detectable in upper respiratory specimens during the acute phase of infection. The lowest concentration of SARS-CoV-2 viral copies this assay can detect is 138 copies/mL. A negative result does not preclude SARS-Cov-2 infection and should not  be used as the sole basis for treatment or other patient management decisions. A negative result may occur with  improper specimen collection/handling, submission of specimen other than nasopharyngeal swab, presence of viral mutation(s) within the areas targeted by this assay, and inadequate number of viral copies(<138 copies/mL). A negative result must be combined with clinical observations, patient history, and epidemiological information. The expected result is Negative.  Fact Sheet for Patients:  BloggerCourse.com  Fact Sheet for Healthcare Providers:  SeriousBroker.it  This test is no t yet approved or cleared by the [place]  FDA and  has been authorized for detection and/or diagnosis of SARS-CoV-2 by FDA under an Emergency Use Authorization (EUA). This EUA will remain  in effect (meaning this test can be used) for the duration of the COVID-19 declaration under Section 564(b)(1) of the Act, 21 U.S.C.section 360bbb-3(b)(1), unless the authorization is terminated  or revoked sooner.       Influenza A by PCR NEGATIVE NEGATIVE Final   Influenza B by PCR NEGATIVE NEGATIVE Final    Comment: (NOTE) The Xpert Xpress SARS-CoV-2/FLU/RSV plus assay is intended as an aid in the diagnosis of influenza from Nasopharyngeal swab specimens and should not be used as a sole basis for treatment. Nasal  washings and aspirates are unacceptable for Xpert Xpress SARS-CoV-2/FLU/RSV testing.  Fact Sheet for Patients: BloggerCourse.com  Fact Sheet for Healthcare Providers: SeriousBroker.it  This test is not yet approved or cleared by the [place]  FDA and has been authorized for detection and/or diagnosis of SARS-CoV-2 by FDA under an Emergency Use Authorization (EUA). This EUA will remain in effect (meaning this test can be used) for the duration of the COVID-19 declaration under Section 564(b)(1) of the Act, 21 U.S.C. section 360bbb-3(b)(1), unless the authorization is terminated or revoked.     Resp Syncytial Virus by PCR NEGATIVE NEGATIVE Final    Comment: (NOTE) Fact Sheet for Patients: BloggerCourse.com  Fact Sheet for Healthcare Providers: SeriousBroker.it  This test is not yet approved or cleared by the [place]  FDA and has been authorized for detection and/or diagnosis of SARS-CoV-2 by FDA under an Emergency Use Authorization (EUA). This EUA will remain in effect (meaning this test can be used) for the duration of the COVID-19 declaration under Section 564(b)(1) of the Act, 21 U.S.C. section 360bbb-3(b)(1), unless the authorization is terminated or revoked.  Performed at [facility] [address], [place], [place] [address]   Urine Culture     Status: None   Collection Time: 01/07/24  5:31 PM   Specimen: Urine, Clean Catch  Result Value Ref Range Status   Specimen Description   Final    URINE, CLEAN CATCH Performed at [facility], [address] [place], [place] [address]    Special Requests   Final    NONE Performed at [facility], [address] [place], [place] [address]    Culture   Final    NO GROWTH Performed at [facility], [address], [place], [place] [address]    Report Status 01/09/2024 FINAL   Final  Blood culture (routine x 2)     Status: None (Preliminary result)   Collection Time: 01/07/24  5:54 PM   Specimen: BLOOD  Result Value Ref Range Status   Specimen Description BLOOD BLOOD LEFT ARM  Final   Special Requests   Final    BOTTLES DRAWN AEROBIC ONLY Blood Culture adequate volume   Culture   Final    NO GROWTH  3 DAYS Performed at [facility], [address] [place], [place] [address]    Report Status PENDING  Incomplete  Group A Strep by PCR ([facility] Only)     Status: None   Collection Time: 01/07/24  5:57 PM   Specimen: Throat; Sterile Swab  Result Value Ref Range Status   Group A Strep by PCR NOT DETECTED NOT DETECTED Final    Comment: Performed at [facility], [address] [place], [place] [address]  Respiratory (~20 pathogens) panel by PCR     Status: None   Collection Time: 01/07/24  9:45 PM   Specimen: Nasopharyngeal Swab; Respiratory  Result Value Ref Range Status   Adenovirus NOT DETECTED NOT DETECTED Final   Coronavirus 229E NOT DETECTED NOT DETECTED Final    Comment: (NOTE) The Coronavirus on the Respiratory Panel, DOES NOT test for the novel  Coronavirus (2019 nCoV)    Coronavirus HKU1 NOT DETECTED NOT DETECTED Final   Coronavirus NL63 NOT DETECTED NOT DETECTED Final   Coronavirus OC43 NOT DETECTED NOT DETECTED Final   Metapneumovirus NOT DETECTED NOT DETECTED Final   Rhinovirus / Enterovirus NOT DETECTED NOT DETECTED Final   Influenza A NOT DETECTED NOT DETECTED Final   Influenza B NOT DETECTED NOT DETECTED Final   Parainfluenza Virus 1 NOT DETECTED NOT DETECTED Final   Parainfluenza Virus 2 NOT DETECTED NOT DETECTED Final   Parainfluenza Virus 3 NOT DETECTED NOT DETECTED Final   Parainfluenza Virus 4 NOT DETECTED NOT DETECTED Final   Respiratory Syncytial Virus NOT DETECTED NOT DETECTED Final   Bordetella pertussis NOT DETECTED NOT DETECTED Final   Bordetella Parapertussis NOT DETECTED NOT DETECTED Final   Chlamydophila  pneumoniae NOT DETECTED NOT DETECTED Final   Mycoplasma pneumoniae NOT DETECTED NOT DETECTED Final    Comment: Performed at [facility], [address] [place], [place] [address]   *Note: Due to a large number of results and/or encounters for the requested time period, some results have not been displayed. A complete set of results can be found in Results Review.    Labs: CBC: Recent Labs  Lab 01/07/24 1409 01/08/24 0157 01/09/24 0545 01/10/24 0728  WBC 24.4*  23.9* 29.7* 31.2* 31.5*  NEUTROABS 4.1  --   --   --   HGB 11.8*  11.7* 9.7* 9.6* 11.4*  HCT 34.7*  35.0* 29.2* 28.4* 33.4*  MCV 92.0  93.3 93.9 91.9 91.3  PLT 69*  70* 59* 53* 62*   Basic Metabolic Panel: Recent Labs  Lab 01/07/24 1409 01/08/24 0157 01/09/24 0545 01/10/24 0728  NA 129* 129* 134* 134*  K 4.4 3.5 3.3* 3.7  CL 92* 95* 103 99  CO2 23 23 21* 24  GLUCOSE 136* 137* 125* 119*  BUN 19 20 11 10   CREATININE 1.44* 1.38* 1.00 0.91  CALCIUM  8.4* 7.6* 7.2* 8.2*   Liver Function Tests: Recent Labs  Lab 01/07/24 1409 01/09/24 0545 01/10/24 0728  AST 27 13* 17  ALT 21 16 24   ALKPHOS 81 66 75  BILITOT 1.3* 1.3* 0.9  PROT 5.4* 4.6* 5.5*  ALBUMIN 3.1* 2.6* 3.0*   Discharge time spent: greater than 30 minutes.  Signed: Melvinia Stager, MD [facility] 01/10/2024

## 2024-01-10 NOTE — Care Management Important Message (Signed)
 Important Message  Patient Details  Name: Russell Simpson MRN: [id number] Date of Birth: [birth year]   Important Message Given:  Yes - Medicare IM     Felix Host 01/10/2024, 3:08 PM

## 2024-01-10 NOTE — Telephone Encounter (Signed)
 Patient's wife called and left msg through our answering service that Russell Simpson is in [facility] and please cancel this weeks appts. Team was notified.

## 2024-01-10 NOTE — Progress Notes (Signed)
 Patient admitted to hospital for SIRS- question viral vs bacterial. No etiology on fever workup. He is being discharged today. Recommend levaquin  500 mg daily. Hold revlimid . We'll see him back in clinic next week to restart treatment. Treatment plan deferred.

## 2024-01-10 NOTE — Plan of Care (Signed)
  Problem: Education: Goal: Knowledge of General Education information will improve Description: Including pain rating scale, medication(s)/side effects and non-pharmacologic comfort measures Outcome: Progressing   Problem: Health Behavior/Discharge Planning: Goal: Ability to manage health-related needs will improve Outcome: Progressing   Problem: Activity: Goal: Risk for activity intolerance will decrease Outcome: Progressing   Problem: Pain Managment: Goal: General experience of comfort will improve and/or be controlled Outcome: Progressing

## 2024-01-11 ENCOUNTER — Ambulatory Visit: Payer: Medicare HMO

## 2024-01-11 ENCOUNTER — Inpatient Hospital Stay: Payer: Medicare HMO

## 2024-01-12 ENCOUNTER — Inpatient Hospital Stay: Payer: Medicare HMO

## 2024-01-12 ENCOUNTER — Ambulatory Visit: Payer: Medicare HMO

## 2024-01-12 ENCOUNTER — Telehealth: Payer: Self-pay | Admitting: *Deleted

## 2024-01-12 ENCOUNTER — Other Ambulatory Visit: Payer: Self-pay | Admitting: *Deleted

## 2024-01-12 DIAGNOSIS — C8202 Follicular lymphoma grade I, intrathoracic lymph nodes: Secondary | ICD-10-CM

## 2024-01-12 LAB — CULTURE, BLOOD (ROUTINE X 2)
Culture: NO GROWTH
Culture: NO GROWTH
Special Requests: ADEQUATE

## 2024-01-12 MED ORDER — LENALIDOMIDE 15 MG PO CAPS: 15.0000 mg | ORAL_CAPSULE | Freq: Every day | ORAL | 0 refills | Status: DC

## 2024-01-12 NOTE — Transitions of Care (Post Inpatient/ED Visit) (Signed)
   01/12/2024  Name: Russell Simpson MRN: [id number] DOB: 1961/03/23  Today's TOC FU Call Status: Today's TOC FU Call Status:: Unsuccessful Call (1st Attempt) Unsuccessful Call (1st Attempt) Date: 01/12/24  Attempted to reach the patient regarding the most recent Inpatient/ED visit.  Follow Up Plan: Additional outreach attempts will be made to reach the patient to complete the Transitions of Care (Post Inpatient/ED visit) call.   Gean Maidens BSN RN [facility] Health Care Management Coordinator [email or web address] Direct Dial: [phone]  Fax: [phone] Website: [email or web address]

## 2024-01-13 ENCOUNTER — Inpatient Hospital Stay: Payer: Medicare HMO | Admitting: Nurse Practitioner

## 2024-01-13 ENCOUNTER — Telehealth: Payer: Self-pay

## 2024-01-13 ENCOUNTER — Telehealth: Payer: Self-pay | Admitting: *Deleted

## 2024-01-13 LAB — PATHOLOGIST SMEAR REVIEW

## 2024-01-13 NOTE — Patient Instructions (Signed)
Visit Information  Thank you for taking time to visit with me today. Please don't hesitate to contact me if I can be of assistance to you   Susa Loffler , BSN, RN [facility]   VBCI-Population Health RN Care Manager Direct Dial [phone]  Fax: [phone] Website: [email or web address]

## 2024-01-13 NOTE — Telephone Encounter (Signed)
 The patient called to see when he has the next treatment and he says there is no appts and then says he sees the appt for 2/17 at 7:45 am for labs

## 2024-01-13 NOTE — Transitions of Care (Post Inpatient/ED Visit) (Signed)
01/13/2024  Name: Russell Simpson MRN: [id number] DOB: Apr 23, 1961  Today's TOC FU Call Status: Today's TOC FU Call Status:: Successful TOC FU Call Completed Unsuccessful Call (1st Attempt) Date: 01/12/24 [facility] FU Call Complete Date: 01/13/24 Patient's Name and Date of Birth confirmed.  Transition Care Management Follow-up Telephone Call Date of Discharge: 01/10/24 Discharge Facility: [facility]) Type of Discharge: Inpatient Admission Primary Inpatient Discharge Diagnosis:: SIRS How have you been since you were released from the hospital?: Better Any questions or concerns?: No  Items Reviewed: Did you receive and understand the discharge instructions provided?: Yes Medications obtained,verified, and reconciled?: Yes (Medications Reviewed) Any new allergies since your discharge?: No Dietary orders reviewed?: Yes Type of Diet Ordered:: Reg Heart Healthy Do you have support at home?: Yes People in Home: significant other, child(ren), adult Name of Support/Comfort Primary Source: SO Harriett Sine Daughter Lillia Abed  Medications Reviewed Today: Medications Reviewed Today     Reviewed by Johnnette Barrios, RN (Registered Nurse) on 01/13/24 at 1257  Med List Status: <None>   Medication Order Taking? Sig Documenting Provider Last Dose Status Informant  acetaminophen (TYLENOL) 325 MG tablet [id number] Yes Take 2 tablets (650 mg) by mouth starting 2 days prior to chemo then 3 days after chemotherapy. Do not take on day of chemotherapy Alinda Dooms, NP Taking Active Self, Pharmacy Records  albuterol (VENTOLIN HFA) 108 (90 Base) MCG/ACT inhaler [id number] Yes Inhale 2 puffs into the lungs every 6 (six) hours as needed for wheezing or shortness of breath. Salena Saner, MD Taking Active Self, Pharmacy Records  atorvastatin (LIPITOR) 80 MG tablet [id number] Yes Take 1 tablet (80 mg total) by mouth daily. Debbe Odea, MD Taking Active Self, Pharmacy Records  Blood  Glucose Monitoring Suppl (ONE TOUCH ULTRA 2) w/Device KIT [id number] No 1 each by Does not apply route daily.  Patient not taking: Reported on 01/13/2024   [name], NP Not Taking Active Self, Pharmacy Records  buPROPion (WELLBUTRIN XL) 300 MG 24 hr tablet [id number] Yes Take 300 mg by mouth daily. [provider] Taking Active Self, Pharmacy Records  cefUROXime (CEFTIN) 500 MG tablet [id number] Yes Take 1 tablet (500 mg total) by mouth 2 (two) times daily with a meal for 3 days. Enedina Finner, MD Taking Active            Med Note Sharon Seller, Eriyana Sweeten L   Thu Jan 13, 2024 12:56 PM) Completed today   cetirizine (ZYRTEC) 10 MG tablet [id number] Yes Take 1 tablet (10 mg total) by mouth daily. [name], NP Taking Active Self, Pharmacy Records  clonazePAM [facility]) 0.5 MG tablet [id number] Yes Take 0.5 mg by mouth every evening. Zorita Pang, PA-C Taking Active Self, Pharmacy Records  diphenhydrAMINE (BENADRYL) 25 mg capsule [id number] Yes Starting 2 days prior to your chemotherapy infusions, take 1 pill (25 mg) by mouth daily. Do not take on day of chemotherapy. Alinda Dooms, NP Taking Active Self, Pharmacy Records  DULoxetine (CYMBALTA) 60 MG capsule [id number] Yes Take 60 mg by mouth every evening. [provider] Taking Active Self, Pharmacy Records  famotidine (PEPCID) 20 MG tablet [id number] Yes Starting 2 days prior to your chemotherapy infusions, take 1 pill (20 mg) by mouth daily. Do not take on day of chemotherapy. Alinda Dooms, NP Taking Active Self, Pharmacy Records  finasteride (PROSCAR) 5 MG tablet [id number] Yes TAKE 1 TABLET BY MOUTH ONCE DAILY McGowan, Wellington Hampshire, PA-C Taking Active Self, Pharmacy Records  fluticasone (FLONASE) 50 MCG/ACT  nasal spray [id number] Yes Place 2 sprays into both nostrils daily. [name], NP Taking Active Self, Pharmacy Records  glucose blood [facility] ULTRA) test strip [id number] No 1 each by Other route daily. Use as instructed   Patient not taking: Reported on 01/13/2024   [name], NP Not Taking Active Self, Pharmacy Records  Lancets [facility] ULTRASOFT) lancets [id number] No 1 each by Other route daily. Use as instructed  Patient not taking: Reported on 01/13/2024   [name], NP Not Taking Active Self, Pharmacy Records  lenalidomide (REVLIMID) 15 MG capsule [id number]  Take 1 capsule (15 mg total) by mouth daily. Take for 21 days, then hold for 7 days. Repeat every 28 days. Creig Hines, MD  Active   metoprolol succinate (TOPROL-XL) 100 MG 24 hr tablet [id number] Yes TAKE 1 TABLET BY MOUTH ONCE DAILY WITH OR IMMEDIATELY FOLLOWING A MEAL [name], NP Taking Active Self, Pharmacy Records  mirtazapine (REMERON) 15 MG tablet [id number] Yes Take 15 mg by mouth at bedtime. [provider] Taking Active Self, Pharmacy Records  montelukast (SINGULAIR) 10 MG tablet [id number] Yes TAKE 1 TABLET EVERY DAY [name], NP Taking Active Self, Pharmacy Records           Med Note Sharon Seller, Jalani Cullifer L   Thu Jan 13, 2024 12:57 PM) See alternate orders   montelukast (SINGULAIR) 10 MG tablet [id number] Yes Starting 2 days prior to your chemotherapy infusions, take 1 pill (10 mg) by mouth at night. Do not take on day of chemotherapy. Alinda Dooms, NP Taking Active Self, Pharmacy Records  naltrexone (DEPADE) 50 MG tablet [id number] Yes Take 50 mg by mouth daily. [provider] Taking Active Self, Pharmacy Records  ondansetron [facility]) 4 MG tablet [id number] Yes Take 1 tablet (4 mg total) by mouth every 8 (eight) hours as needed for nausea or vomiting. Creig Hines, MD Taking Active Self, Pharmacy Records  predniSONE (DELTASONE) 50 MG tablet [id number] Yes Take 1 tablet (50 mg) by mouth starting 2 days prior to chemo then 3 days after chemotherapy. Do not take on day of chemotherapy Alinda Dooms, NP Taking Active Self, Pharmacy Records  sildenafil (VIAGRA) 100 MG tablet [id number] Yes Take 1  tablet (100 mg total) by mouth daily as needed for erectile dysfunction. Take two hours prior to intercourse on an empty stomach McGowan, Wellington Hampshire, PA-C Taking Active Self, Pharmacy Records  tenofovir alafenamide (VEMLIDY) 25 MG tablet [id number] Yes Take 1 tablet (25 mg total) by mouth daily. Creig Hines, MD Taking Active Self, Pharmacy Records  testosterone cypionate (DEPOTESTOSTERONE CYPIONATE) 200 MG/ML injection [id number] Yes Inject 1 mL (200 mg total) into the muscle every 14 (fourteen) days. INJECT INTRAMUSCULARLY EVERY 14 DAYS McGowan, Wellington Hampshire, PA-C Taking Active Self, Pharmacy Records  Med List Note Remi Haggard, RPH-CPP 12/27/23 1112): Revlimid filled at [facility]---------- Wynonia Sours filled at [facility]            [facility] and Equipment/Supplies: Were Home Health Services Ordered?: No Any new equipment or medical supplies ordered?: No  Functional Questionnaire: Do you need assistance with bathing/showering or dressing?: No Do you need assistance with meal preparation?: No Do you need assistance with eating?: No Do you have difficulty maintaining continence: No Do you need assistance with getting out of bed/getting out of a chair/moving?: No Do you have difficulty managing or taking your medications?: No  Follow up appointments reviewed: PCP Follow-up appointment confirmed?: Yes Date of PCP follow-up appointment?:  01/28/24 Follow-up Provider: [name] Specialist [facility] Follow-up appointment confirmed?: Yes Date of Specialist follow-up appointment?: 01/17/24 Follow-Up Specialty Provider:: Oncology 2/17, Urology 2/20 Do you need transportation to your follow-up appointment?: No (He drives) Do you understand care options if your condition(s) worsen?: Yes-patient verbalized understanding  SDOH Interventions Today    Flowsheet Row Most Recent Value  SDOH Interventions   Food Insecurity Interventions Intervention Not Indicated   Housing Interventions Intervention Not Indicated  Transportation Interventions Intervention Not Indicated, Patient Resources (Friends/Family)  Utilities Interventions Intervention Not Indicated      Interventions Today    Flowsheet Row Most Recent Value  Chronic Disease   Chronic disease during today's visit Diabetes, Other  [SIRS]  General Interventions   General Interventions Discussed/Reviewed General Interventions Discussed, General Interventions Reviewed, Doctor Visits  Doctor Visits Discussed/Reviewed Doctor Visits Reviewed, PCP, Specialist  PCP/Specialist Visits Compliance with follow-up visit  Exercise Interventions   Exercise Discussed/Reviewed Physical Activity  Physical Activity Discussed/Reviewed Physical Activity Reviewed  Nutrition Interventions   Nutrition Discussed/Reviewed Nutrition Reviewed  Pharmacy Interventions   Pharmacy Dicussed/Reviewed Medications and their functions        Benefits reviewed  Based on current information and Insurance plan -Reviewed benefits accessible to patient, including details about eligibility options for care and  available value based care options  if any areas of needs were identified.  Reviewed patient/  caregiver's ability to access and / or  ability with navigating the benefits system..Amb Referral made if indicted , refer to orders section of note for details   Reviewed goals for care Patient/ Caregiver  verbalizes understanding of instructions and care plan provided. Patient / Caregiver was encouraged to make informed decisions about their care, actively participate in managing their health condition, and implement lifestyle changes as needed to promote independence and self-management of health care. There were no reported  barriers to care.   TOC program  Patient is at high risk for readmission and/or has history of  high utilization  Discussed VBCI  TOC program and weekly calls to patient to assess condition/status,  medication management  and provide support/education as indicated . Patient/ Caregiver voiced understanding and declined enrollment in the 30-day TOC Program.   He is doing well competed ABT today. He has follow-up visits scheduled. He did not feel he needed additional follow-up at this time     The patient has been provided with contact information for the care management team and has been advised to call with any health-related questions or concerns. Follow up as indicated with Care Team , or sooner should any new problems arise.     Susa Loffler , BSN, RN [facility]   VBCI-Population Health RN Care Manager Direct Dial [phone]  Fax: [phone] Website: [email or web address]

## 2024-01-14 ENCOUNTER — Encounter: Payer: Self-pay | Admitting: Oncology

## 2024-01-14 DIAGNOSIS — M5412 Radiculopathy, cervical region: Secondary | ICD-10-CM | POA: Diagnosis not present

## 2024-01-14 DIAGNOSIS — M5416 Radiculopathy, lumbar region: Secondary | ICD-10-CM | POA: Diagnosis not present

## 2024-01-14 DIAGNOSIS — M9901 Segmental and somatic dysfunction of cervical region: Secondary | ICD-10-CM | POA: Diagnosis not present

## 2024-01-14 DIAGNOSIS — M9903 Segmental and somatic dysfunction of lumbar region: Secondary | ICD-10-CM | POA: Diagnosis not present

## 2024-01-14 MED FILL — Dexamethasone Sodium Phosphate Inj 100 MG/10ML: INTRAMUSCULAR | Qty: 2 | Status: AC

## 2024-01-17 ENCOUNTER — Ambulatory Visit: Payer: Medicare HMO | Admitting: Oncology

## 2024-01-17 ENCOUNTER — Ambulatory Visit: Payer: Medicare HMO

## 2024-01-17 ENCOUNTER — Ambulatory Visit: Payer: Medicare HMO | Admitting: Dermatology

## 2024-01-17 ENCOUNTER — Encounter: Payer: Self-pay | Admitting: Oncology

## 2024-01-17 ENCOUNTER — Other Ambulatory Visit: Payer: Self-pay | Admitting: Oncology

## 2024-01-17 ENCOUNTER — Inpatient Hospital Stay: Payer: Medicare HMO

## 2024-01-17 ENCOUNTER — Inpatient Hospital Stay (HOSPITAL_BASED_OUTPATIENT_CLINIC_OR_DEPARTMENT_OTHER): Payer: Medicare HMO | Admitting: Oncology

## 2024-01-17 ENCOUNTER — Inpatient Hospital Stay: Payer: Medicare HMO | Admitting: Oncology

## 2024-01-17 VITALS — BP 118/77 | HR 89 | Temp 97.8°F | Resp 19 | Wt 232.9 lb

## 2024-01-17 DIAGNOSIS — Z87891 Personal history of nicotine dependence: Secondary | ICD-10-CM | POA: Diagnosis not present

## 2024-01-17 DIAGNOSIS — Z7189 Other specified counseling: Secondary | ICD-10-CM | POA: Diagnosis not present

## 2024-01-17 DIAGNOSIS — C8202 Follicular lymphoma grade I, intrathoracic lymph nodes: Secondary | ICD-10-CM

## 2024-01-17 DIAGNOSIS — Z5112 Encounter for antineoplastic immunotherapy: Secondary | ICD-10-CM | POA: Diagnosis not present

## 2024-01-17 DIAGNOSIS — D6959 Other secondary thrombocytopenia: Secondary | ICD-10-CM | POA: Diagnosis not present

## 2024-01-17 DIAGNOSIS — T451X5A Adverse effect of antineoplastic and immunosuppressive drugs, initial encounter: Secondary | ICD-10-CM | POA: Diagnosis not present

## 2024-01-17 DIAGNOSIS — D72820 Lymphocytosis (symptomatic): Secondary | ICD-10-CM | POA: Diagnosis not present

## 2024-01-17 MED FILL — Famotidine Inj 200 MG/20ML: INTRAVENOUS | Qty: 4 | Status: AC

## 2024-01-17 NOTE — Progress Notes (Signed)
Hematology/Oncology Consult note [facility]  Telephone:([phone] [phone]  Patient Care Team: [name], NP as PCP - General (Nurse Practitioner) Debbe Odea, MD as PCP - Cardiology (Cardiology) Creig Hines, MD as Consulting Physician (Oncology) Salena Saner, MD as Consulting Physician (Pulmonary Disease) Rodney Langton, RN as [facility] Care Management   Name of the patient: Russell Simpson  [id number]  [birth year]   Date of visit: 01/17/24  Diagnosis-stage IV low-grade follicular lymphoma  Chief complaint/ Reason for visit-discuss further management of follicular lymphoma  Heme/Onc history: patient is a 63 year old male with a past medical history significant for hypertension, hyperlipidemia, hypogonadism, pituitary adenoma who recently had a fall on in December 2020. [name]  He sustained a left anterior frontal sinus as well as supraorbital rim fracture on 11/21/2019 which was managed conservatively.  He then presented to the ER at [facility] with symptoms of right-sided chest wall pain this led to a CT PE which did not show any evidence of pulmonary embolism.  He was noted to have bilateral symmetric axillary adenopathy measuring up to 1.8 cm.  Scattered mediastinal adenopathy.  Right paratracheal node measuring up to 1.2 cm.  Prominent right costophrenic lymph node measuring up to 1 cm.  Findings are nonspecific but could be seen with lymphoma versus systemic inflammatory disease such as sarcoidosis.  Of note patient has had a prior CT scan for lung screening back in 2015 when he was not noted to have this adenopathy.    Repeat CT chest in August 2021 showed persistent mild nonbulky axillary and mediastinal adenopathy which had not changed significantly as compared to his prior CT in December 2020.  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   Repeat CT in March  2022 showed Progression and multistation adenopathy as well as significant splenomegaly measuring 20.4 cm as compared to 14.5 cm on CT scan September 2021.  CT scan showed multistation adenopathy with SUVs ranging between 6-8.9.  Patient underwent another core biopsy of right inguinal lymph node which was consistent with follicular lymphoma grade 1-2.  Bone marrow biopsy also showed moderate involvement with non-Hodgkin's B-cell lymphoma.  Baseline hepatitis B testing showed core antibody positivity.  Seen by GI and started on tenofovir   Patient had symptoms of nausea and sweating during cycle 1 of Rituxan which resolved with additional premedications.  He developed symptoms of itching over neck chest back and bilateral eyes with second dose of Rituxan early and had to get more premedications and was not rechallenged on the same day.  Patient subsequently received cycle 3 of Bendamustine Rituxan chemotherapy after Rituxan was given as a split dose over 3 days   Scans after 6 cycles of Bendamustine Rituxan chemotherapy in October 2022 showed significant improvement with therapy.  Lymphadenopathy in his neck and mediastinum resolved Deauville 2.  Decrease in the splenic size and hypermetabolism.  No new lesions.  FDG accumulation within the skeleton presumably due to stimulatory effects of treatment.   Scans in April 2023 showed pulmonary nodules requiring further assessment and patient was seen by pulmonary.  He also had a PET CT scan in October 2023 which showed gradual enlargement and hypermetabolic some noted in the lung nodules.  Patient also has intra-abdominal lymph nodes ranging from 9 to 1.2 cm with an SUV less than 3.  Patient had right lower lobe lung biopsy which was negative for malignancy.   Due  to continued increase in the size of his lymph nodes as well as splenomegaly with a spleen size of 20 cm plan was to offer second line treatment with Rituxan and Revlimid    Interval history-patient  has received 2 weekly doses of Rituxan so far.  He took Revlimid 15 mg for 2 weeks which she stopped on 01/03/2024 after he developed significant rash, loss of appetite blurry vision as well as worsening night sweats.  Overall he is feeling better today.  He has not had any fever.  He denies any cough or shortness of breath or abdominal pain.  Rash has nearly resolved.  He does not wish to continue with any treatments at this point.  ECOG PS- 1 Pain scale- 0   Review of systems- Review of Systems  Constitutional:  Positive for malaise/fatigue. Negative for chills, fever and weight loss.  HENT:  Negative for congestion, ear discharge and nosebleeds.   Eyes:  Negative for blurred vision.  Respiratory:  Negative for cough, hemoptysis, sputum production, shortness of breath and wheezing.   Cardiovascular:  Negative for chest pain, palpitations, orthopnea and claudication.  Gastrointestinal:  Negative for abdominal pain, blood in stool, constipation, diarrhea, heartburn, melena, nausea and vomiting.  Genitourinary:  Negative for dysuria, flank pain, frequency, hematuria and urgency.  Musculoskeletal:  Negative for back pain, joint pain and myalgias.  Skin:  Positive for rash.  Neurological:  Negative for dizziness, tingling, focal weakness, seizures, weakness and headaches.  Endo/Heme/Allergies:  Does not bruise/bleed easily.  Psychiatric/Behavioral:  Negative for depression and suicidal ideas. The patient does not have insomnia.       Allergies  Allergen Reactions   Penicillins Anaphylaxis    Tolerates cefdinir, cephalexin and amoxicillin/clavulonate so true PCN allergy unlikely   Tetanus Toxoids Swelling   Lisinopril Cough   Losartan      Other reaction(s): Muscle Pain     Codeine Nausea Only and Nausea And Vomiting   Doxycycline Rash   Ruxience [Rituximab-Pvvr] Nausea And Vomiting, Rash and Other (See Comments)    05/06/21 pt w/ worsening rash during Ruxience infusion.  Face, neck and  chest flushing redness  12/27/2023: flushing, diaphoresis, nausea and vomiting     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HCC)    Arthritis    Asthma    Basal cell carcinoma 01/28/2021   R upper arm, EDC 03/04/2021   Basal cell carcinoma 12/31/2022   Right temporal hair line. Nodular. 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   left upper back ED&C done   Hypertension    Hyponatremia    Inappropriate sinus tachycardia (HCC)    Non Hodgkin's lymphoma (HCC)    Pneumonia    RSV (respiratory syncytial virus infection)  SCC (squamous cell carcinoma) 08/28/2021   upper chest right of midline, EDC done 09/17/2021   SCC (squamous cell carcinoma) 09/30/2022   left chest  ED&C done   Sleep apnea    does not wear CPAP ; uses humidifier instead   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   right upper back, ED&C 06/18/2022   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   right upper back ED&C done   Squamous cell carcinoma of skin 02/19/2021   L inferior mandible, treated with EDC   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/28/2021   R ant shoulder - ED&C   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/28/2021   L upper abdomen - ED&C     Past Surgical History:  Procedure Laterality Date   ANTERIOR CERVICAL DECOMP/DISCECTOMY FUSION N/A 06/17/2016    Procedure: ANTERIOR CERVICAL DECOMPRESSION FUSION, CERVICAL 3-4, CERVICAL 4-5 WITH INSTRUMENTATION AND ALLOGRAFT;  Surgeon: Estill Bamberg, MD;  Location: [facility] OR;  Service: Orthopedics;  Laterality: N/A;  ANTERIOR CERVICAL DECOMPRESSION FUSION, CERVICAL 3-4, CERVICAL 4-5 WITH INSTRUMENTATION AND ALLOGRAFT   BACK SURGERY     x3   COLONOSCOPY WITH PROPOFOL N/A 12/10/2023   Procedure: COLONOSCOPY WITH PROPOFOL;  Surgeon: Toney Reil, MD;  Location: [facility] ENDOSCOPY;  Service: Gastroenterology;  Laterality: N/A;   HARDWARE REMOVAL Right 05/04/2023   Procedure: HARDWARE REMOVAL;  Surgeon: Juanell Fairly, MD;  Location: [facility] ORS;  Service: Orthopedics;  Laterality: Right;   HEMOSTASIS CLIP PLACEMENT  12/10/2023   Procedure: HEMOSTASIS CLIP PLACEMENT;  Surgeon: Toney Reil, MD;  Location: [facility] ENDOSCOPY;  Service: Gastroenterology;;   HEMOSTASIS CONTROL  12/10/2023   Procedure: HEMOSTASIS CONTROL;  Surgeon: Toney Reil, MD;  Location: [facility] ENDOSCOPY;  Service: Gastroenterology;;   NECK SURGERY  12/01/2007   ORIF ANKLE FRACTURE Right 12/02/2022   Procedure: OPEN REDUCTION INTERNAL FIXATION (ORIF) ANKLE FRACTURE;  Surgeon: Juanell Fairly, MD;  Location: [facility] ORS;  Service: Orthopedics;  Laterality: Right;   ORIF ANKLE FRACTURE Right 12/01/2022   Procedure: OPEN REDUCTION INTERNAL FIXATION (ORIF) ANKLE FRACTURE;  Surgeon: Juanell Fairly, MD;  Location: [facility] ORS;  Service: Orthopedics;  Laterality: Right;   POLYPECTOMY  12/10/2023   Procedure: POLYPECTOMY;  Surgeon: Toney Reil, MD;  Location: [facility] ENDOSCOPY;  Service: Gastroenterology;;   PORTA CATH INSERTION N/A 04/03/2021   Procedure: PORTA CATH INSERTION;  Surgeon: Annice Needy, MD;  Location: [facility] INVASIVE CV LAB;  Service: Cardiovascular;  Laterality: N/A;   SUBMUCOSAL LIFTING INJECTION  12/10/2023   Procedure: SUBMUCOSAL LIFTING INJECTION;  Surgeon: Toney Reil, MD;  Location: [facility] ENDOSCOPY;  Service:  Gastroenterology;;   SUBMUCOSAL TATTOO INJECTION  12/10/2023   Procedure: SUBMUCOSAL TATTOO INJECTION;  Surgeon: Toney Reil, MD;  Location: [facility] ENDOSCOPY;  Service: Gastroenterology;;   VIDEO BRONCHOSCOPY WITH ENDOBRONCHIAL ULTRASOUND Bilateral 10/14/2022   Procedure: VIDEO BRONCHOSCOPY WITH ENDOBRONCHIAL ULTRASOUND;  Surgeon: Salena Saner, MD;  Location: [facility] ORS;  Service: Pulmonary;  Laterality: Bilateral;    Social History   Socioeconomic History   Marital status: Divorced    Spouse name: Not on file   Number of children: Not on file   Years of education: Not on file   Highest education level: Not on file  Occupational History   Not on file  Tobacco Use   Smoking status: Former    Types: Cigars    Quit date: 11/30/2022    Years since quitting: 1.1   Smokeless tobacco: Never  Vaping Use   Vaping status:  Former   Start date: 04/30/2017   Quit date: 11/30/2017  Substance and Sexual Activity   Alcohol use: Yes    Comment: beers 6 a day   Drug use: No   Sexual activity: Not Currently  Other Topics Concern   Not on file  Social History Narrative   Not on file   Social Drivers of Health   Financial Resource Strain: Medium Risk (04/05/2023)   Overall Financial Resource Strain (CARDIA)    Difficulty of Paying Living Expenses: Somewhat hard  Food Insecurity: No Food Insecurity (01/13/2024)   Hunger Vital Sign    Worried About Running Out of Food in the Last Year: Never true    Ran Out of Food in the Last Year: Never true  Transportation Needs: No Transportation Needs (01/13/2024)   PRAPARE - Administrator, Civil Service (Medical): No    Lack of Transportation (Non-Medical): No  Physical Activity: Inactive (01/01/2023)   Exercise Vital Sign    Days of Exercise per Week: 0 days    Minutes of Exercise per Session: 0 min  Stress: No Stress Concern Present (04/05/2023)   [name] of Occupational Health - Occupational Stress Questionnaire    Feeling  of Stress : Only a little  Social Connections: Socially Isolated (04/05/2023)   Social Connection and Isolation Panel [NHANES]    Frequency of Communication with Friends and Family: More than three times a week    Frequency of Social Gatherings with Friends and Family: Twice a week    Attends Religious Services: Never    Database administrator or Organizations: No    Attends Banker Meetings: Never    Marital Status: Divorced  Catering manager Violence: Not At Risk (01/13/2024)   Humiliation, Afraid, Rape, and Kick questionnaire    Fear of Current or Ex-Partner: No    Emotionally Abused: No    Physically Abused: No    Sexually Abused: No    Family History  Problem Relation Age of Onset   Diabetes Mother    Heart attack Father    Emphysema Father    Diabetes Other      Current Outpatient Medications:    acetaminophen (TYLENOL) 325 MG tablet, Take 2 tablets (650 mg) by mouth starting 2 days prior to chemo then 3 days after chemotherapy. Do not take on day of chemotherapy, Disp: 90 tablet, Rfl: 0   albuterol (VENTOLIN HFA) 108 (90 Base) MCG/ACT inhaler, Inhale 2 puffs into the lungs every 6 (six) hours as needed for wheezing or shortness of breath., Disp: 18 g, Rfl: 6   atorvastatin (LIPITOR) 80 MG tablet, Take 1 tablet (80 mg total) by mouth daily., Disp: 90 tablet, Rfl: 3   Blood Glucose Monitoring Suppl (ONE TOUCH ULTRA 2) w/Device KIT, 1 each by Does not apply route daily. (Patient not taking: Reported on 01/13/2024), Disp: 1 kit, Rfl: 0   buPROPion (WELLBUTRIN XL) 300 MG 24 hr tablet, Take 300 mg by mouth daily., Disp: , Rfl:    cetirizine (ZYRTEC) 10 MG tablet, Take 1 tablet (10 mg total) by mouth daily., Disp: 30 tablet, Rfl: 11   clonazePAM (KLONOPIN) 0.5 MG tablet, Take 0.5 mg by mouth every evening., Disp: , Rfl:    diphenhydrAMINE (BENADRYL) 25 mg capsule, Starting 2 days prior to your chemotherapy infusions, take 1 pill (25 mg) by mouth daily. Do not take on day of  chemotherapy., Disp: 30 capsule, Rfl: 0   DULoxetine (CYMBALTA) 60 MG capsule, Take  60 mg by mouth every evening., Disp: , Rfl:    famotidine (PEPCID) 20 MG tablet, Starting 2 days prior to your chemotherapy infusions, take 1 pill (20 mg) by mouth daily. Do not take on day of chemotherapy., Disp: 60 tablet, Rfl: 0   finasteride (PROSCAR) 5 MG tablet, TAKE 1 TABLET BY MOUTH ONCE DAILY, Disp: 90 tablet, Rfl: 3   fluticasone (FLONASE) 50 MCG/ACT nasal spray, Place 2 sprays into both nostrils daily., Disp: 16 g, Rfl: 6   glucose blood (ONETOUCH ULTRA) test strip, 1 each by Other route daily. Use as instructed (Patient not taking: Reported on 01/13/2024), Disp: 100 each, Rfl: 3   Lancets (ONETOUCH ULTRASOFT) lancets, 1 each by Other route daily. Use as instructed (Patient not taking: Reported on 01/13/2024), Disp: 100 each, Rfl: 3   lenalidomide (REVLIMID) 15 MG capsule, TAKE 1 CAPSULE BY MOUTH DAILY FOR 21 DAYS, THEN HOLD FOR 7 DAYS. REPEAT EVERY 28 DAYS, Disp: 21 capsule, Rfl: 0   lenalidomide (REVLIMID) 15 MG capsule, Take 1 capsule (15 mg total) by mouth daily. Take for 21 days, then hold for 7 days. Repeat every 28 days., Disp: 21 capsule, Rfl: 0   metoprolol succinate (TOPROL-XL) 100 MG 24 hr tablet, TAKE 1 TABLET BY MOUTH ONCE DAILY WITH OR IMMEDIATELY FOLLOWING A MEAL, Disp: 90 tablet, Rfl: 0   mirtazapine (REMERON) 15 MG tablet, Take 15 mg by mouth at bedtime., Disp: , Rfl:    montelukast (SINGULAIR) 10 MG tablet, TAKE 1 TABLET EVERY DAY, Disp: 90 tablet, Rfl: 1   montelukast (SINGULAIR) 10 MG tablet, Starting 2 days prior to your chemotherapy infusions, take 1 pill (10 mg) by mouth at night. Do not take on day of chemotherapy., Disp: 30 tablet, Rfl: 0   naltrexone (DEPADE) 50 MG tablet, Take 50 mg by mouth daily., Disp: , Rfl:    ondansetron (ZOFRAN) 4 MG tablet, Take 1 tablet (4 mg total) by mouth every 8 (eight) hours as needed for nausea or vomiting., Disp: 30 tablet, Rfl: 1   predniSONE  (DELTASONE) 50 MG tablet, Take 1 tablet (50 mg) by mouth starting 2 days prior to chemo then 3 days after chemotherapy. Do not take on day of chemotherapy, Disp: 30 tablet, Rfl: 0   sildenafil (VIAGRA) 100 MG tablet, Take 1 tablet (100 mg total) by mouth daily as needed for erectile dysfunction. Take two hours prior to intercourse on an empty stomach, Disp: 30 tablet, Rfl: 3   tenofovir alafenamide (VEMLIDY) 25 MG tablet, Take 1 tablet (25 mg total) by mouth daily., Disp: 30 tablet, Rfl: 1   testosterone cypionate (DEPOTESTOSTERONE CYPIONATE) 200 MG/ML injection, Inject 1 mL (200 mg total) into the muscle every 14 (fourteen) days. INJECT INTRAMUSCULARLY EVERY 14 DAYS, Disp: 10 mL, Rfl: 0 No current facility-administered medications for this visit.  Facility-Administered Medications Ordered in Other Visits:    heparin lock flush 100 unit/mL, 500 Units, Intravenous, Once, Creig Hines, MD   heparin lock flush 100 unit/mL, 500 Units, Intravenous, Once, Creig Hines, MD   sodium chloride flush (NS) 0.9 % injection 10 mL, 10 mL, Intravenous, PRN, Creig Hines, MD  Physical exam:  Vitals:   01/17/24 1427  BP: 118/77  Pulse: 89  Resp: 19  Temp: 97.8 F (36.6 C)  TempSrc: Tympanic  SpO2: 100%  Weight: 232 lb 14.4 oz (105.6 kg)   Physical Exam Cardiovascular:     Rate and Rhythm: Normal rate and regular rhythm.  Heart sounds: Normal heart sounds.  Pulmonary:     Effort: Pulmonary effort is normal.     Breath sounds: Normal breath sounds.  Abdominal:     General: Bowel sounds are normal.     Palpations: Abdomen is soft.  Skin:    General: Skin is warm and dry.     Comments: No active skin rash noted on exam today.  Stigmata of prior rash and hyperpigmentation noted over back and chest wall  Neurological:     Mental Status: He is alert and oriented to person, place, and time.         Latest Ref Rng & Units 01/10/2024    7:28 AM  CMP  Glucose 70 - 99 mg/dL 161   BUN 8 -  23 mg/dL 10   Creatinine 0.96 - 1.24 mg/dL 0.45   Sodium 409 - 811 mmol/L 134   Potassium 3.5 - 5.1 mmol/L 3.7   Chloride 98 - 111 mmol/L 99   CO2 22 - 32 mmol/L 24   Calcium 8.9 - 10.3 mg/dL 8.2   Total Protein 6.5 - 8.1 g/dL 5.5   Total Bilirubin 0.0 - 1.2 mg/dL 0.9   Alkaline Phos 38 - 126 U/L 75   AST 15 - 41 U/L 17   ALT 0 - 44 U/L 24       Latest Ref Rng & Units 01/10/2024    7:28 AM  CBC  WBC 4.0 - 10.5 K/uL 31.5   Hemoglobin 13.0 - 17.0 g/dL 91.4   Hematocrit 78.2 - 52.0 % 33.4   Platelets 150 - 400 K/uL 62     No images are attached to the encounter.  DG Chest Portable 1 View Result Date: 01/07/2024 CLINICAL DATA:  Generalized body ache and dizziness. History of non-Hodgkin lymphoma. EXAM: PORTABLE CHEST 1 VIEW COMPARISON:  04/06/2023 FINDINGS: No focal airspace consolidation, pulmonary edema, or pleural effusion. The cardiopericardial silhouette is within normal limits for size. Right-sided Port-A-Cath again noted with tip overlying the low right innominate vein, potentially just at the innominate vein confluence. No acute bony abnormality. IMPRESSION: 1. No acute cardiopulmonary findings. 2. Right-sided Port-A-Cath tip overlies the low right innominate vein, potentially just at the innominate vein confluence. Tip projects higher on today's study, likely secondary to the more lordotic positioning on the current exam. Electronically Signed   By: Kennith Center M.D.   On: 01/07/2024 17:04     Assessment and plan- Patient is a 63 y.o. male with history of recurrent follicular lymphoma s/p 2 weekly cycles of Rituxan here to discuss further management  Patient is s/p 2 weekly cycles of Rituxan which she has been getting as a split dose over 3 days due to prior reaction.  He was able to complete this without any significant side effects.  He did develop a confluent rash over his trunk likely secondary to Revlimid.  He was also admitted to the hospital forSIRS although the source was  unknown.  He was discharged from the hospital withWhite cell count of 30 and his count remains 31 today.  On clinical exam there is no localizing signs and symptoms of infection.  Also differential mainly showed lymphocytosis which can be either a leukemoid reaction from his lymphoma versus reactive.  Patient has baseline thrombocytopenia with a platelet count fluctuates in the 100s and presently is in the 60s likely secondary to Revlimid.  I will plan to repeat his CBC with differential in 2 weeks time.  Patient does not wish  to continue any treatment for his follicular lymphoma at this time.  We discussed trying low-dose Revlimid such as 5 mg to see if he tolerates it better but patient does not want that.  He does not wish to continue with Rituxan either.  I am discontinuing all future treatments.  I will see him back in 3 months time with CBC with differential CMP and LDH.  We will continue to keep his port flushed every 3 months   Visit Diagnosis 1. 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)   2. Goals of care, counseling/discussion      Dr. Owens Shark, MD, MPH [facility] at [facility] [phone] 01/17/2024 3:57 PM

## 2024-01-18 ENCOUNTER — Ambulatory Visit: Payer: Medicare HMO | Admitting: Oncology

## 2024-01-18 ENCOUNTER — Ambulatory Visit: Payer: Medicare HMO

## 2024-01-18 ENCOUNTER — Inpatient Hospital Stay: Payer: Medicare HMO

## 2024-01-19 ENCOUNTER — Ambulatory Visit: Payer: Medicare HMO | Admitting: Nurse Practitioner

## 2024-01-19 ENCOUNTER — Ambulatory Visit: Payer: Medicare HMO

## 2024-01-19 ENCOUNTER — Inpatient Hospital Stay: Payer: Medicare HMO

## 2024-01-20 ENCOUNTER — Telehealth: Payer: Self-pay | Admitting: *Deleted

## 2024-01-20 ENCOUNTER — Other Ambulatory Visit: Payer: Medicare HMO

## 2024-01-20 DIAGNOSIS — M9903 Segmental and somatic dysfunction of lumbar region: Secondary | ICD-10-CM | POA: Diagnosis not present

## 2024-01-20 DIAGNOSIS — N5201 Erectile dysfunction due to arterial insufficiency: Secondary | ICD-10-CM | POA: Diagnosis not present

## 2024-01-20 DIAGNOSIS — E291 Testicular hypofunction: Secondary | ICD-10-CM | POA: Diagnosis not present

## 2024-01-20 DIAGNOSIS — M9901 Segmental and somatic dysfunction of cervical region: Secondary | ICD-10-CM | POA: Diagnosis not present

## 2024-01-20 DIAGNOSIS — M5412 Radiculopathy, cervical region: Secondary | ICD-10-CM | POA: Diagnosis not present

## 2024-01-20 DIAGNOSIS — M5416 Radiculopathy, lumbar region: Secondary | ICD-10-CM | POA: Diagnosis not present

## 2024-01-20 NOTE — Telephone Encounter (Signed)
Got a call from [facility] about revlimid and that he had rash with revlimid and he will not take it at all anymore. He stopped the med. I spoke to [employer].

## 2024-01-21 DIAGNOSIS — M5416 Radiculopathy, lumbar region: Secondary | ICD-10-CM | POA: Diagnosis not present

## 2024-01-21 LAB — HEMOGLOBIN AND HEMATOCRIT, BLOOD
Hematocrit: 35.6 % — ABNORMAL LOW (ref 37.5–51.0)
Hemoglobin: 11.9 g/dL — ABNORMAL LOW (ref 13.0–17.7)

## 2024-01-21 LAB — TESTOSTERONE: Testosterone: 398 ng/dL (ref 264–916)

## 2024-01-24 ENCOUNTER — Other Ambulatory Visit: Payer: Medicare HMO

## 2024-01-24 ENCOUNTER — Ambulatory Visit: Payer: Medicare HMO | Admitting: Oncology

## 2024-01-24 ENCOUNTER — Ambulatory Visit: Payer: Self-pay | Admitting: Nurse Practitioner

## 2024-01-24 ENCOUNTER — Ambulatory Visit: Payer: Medicare HMO

## 2024-01-25 ENCOUNTER — Other Ambulatory Visit: Payer: Self-pay

## 2024-01-25 ENCOUNTER — Ambulatory Visit: Payer: Medicare HMO

## 2024-01-25 ENCOUNTER — Ambulatory Visit: Payer: Medicare HMO | Admitting: Dermatology

## 2024-01-26 ENCOUNTER — Ambulatory Visit: Payer: Medicare HMO

## 2024-01-26 DIAGNOSIS — M9903 Segmental and somatic dysfunction of lumbar region: Secondary | ICD-10-CM | POA: Diagnosis not present

## 2024-01-26 DIAGNOSIS — M5416 Radiculopathy, lumbar region: Secondary | ICD-10-CM | POA: Diagnosis not present

## 2024-01-26 DIAGNOSIS — M5412 Radiculopathy, cervical region: Secondary | ICD-10-CM | POA: Diagnosis not present

## 2024-01-26 DIAGNOSIS — M9901 Segmental and somatic dysfunction of cervical region: Secondary | ICD-10-CM | POA: Diagnosis not present

## 2024-01-27 ENCOUNTER — Ambulatory Visit: Payer: Medicare HMO

## 2024-01-27 NOTE — Telephone Encounter (Signed)
 Patient called and said that [facility] still does not have his prescription.

## 2024-01-28 ENCOUNTER — Ambulatory Visit (INDEPENDENT_AMBULATORY_CARE_PROVIDER_SITE_OTHER): Payer: Medicare HMO | Admitting: Nurse Practitioner

## 2024-01-28 ENCOUNTER — Other Ambulatory Visit: Payer: Self-pay | Admitting: *Deleted

## 2024-01-28 ENCOUNTER — Encounter: Payer: Self-pay | Admitting: Nurse Practitioner

## 2024-01-28 VITALS — BP 113/74 | HR 94 | Temp 98.6°F | Ht 74.0 in | Wt 233.0 lb

## 2024-01-28 DIAGNOSIS — I10 Essential (primary) hypertension: Secondary | ICD-10-CM | POA: Diagnosis not present

## 2024-01-28 DIAGNOSIS — C8202 Follicular lymphoma grade I, intrathoracic lymph nodes: Secondary | ICD-10-CM

## 2024-01-28 DIAGNOSIS — J449 Chronic obstructive pulmonary disease, unspecified: Secondary | ICD-10-CM | POA: Diagnosis not present

## 2024-01-28 DIAGNOSIS — Z23 Encounter for immunization: Secondary | ICD-10-CM | POA: Diagnosis not present

## 2024-01-28 DIAGNOSIS — I700 Atherosclerosis of aorta: Secondary | ICD-10-CM

## 2024-01-28 DIAGNOSIS — G629 Polyneuropathy, unspecified: Secondary | ICD-10-CM | POA: Insufficient documentation

## 2024-01-28 DIAGNOSIS — R7309 Other abnormal glucose: Secondary | ICD-10-CM | POA: Diagnosis not present

## 2024-01-28 DIAGNOSIS — Z0000 Encounter for general adult medical examination without abnormal findings: Secondary | ICD-10-CM

## 2024-01-28 DIAGNOSIS — F3160 Bipolar disorder, current episode mixed, unspecified: Secondary | ICD-10-CM | POA: Diagnosis not present

## 2024-01-28 DIAGNOSIS — E039 Hypothyroidism, unspecified: Secondary | ICD-10-CM

## 2024-01-28 DIAGNOSIS — E785 Hyperlipidemia, unspecified: Secondary | ICD-10-CM

## 2024-01-28 MED ORDER — GABAPENTIN 100 MG PO CAPS: 100.0000 mg | ORAL_CAPSULE | Freq: Every day | ORAL | 1 refills | Status: DC

## 2024-01-28 NOTE — Assessment & Plan Note (Signed)
 Repeat labs ordered today.  Did not follow up with Endocrinology.

## 2024-01-28 NOTE — Assessment & Plan Note (Signed)
 Chronic.  Will start Gabapentin 100mg  at bedtime.  Side effects and benefits of medication discussed during visit.  Follow up in 3 months.  If tolerating well, can consider increasing.  Also recommend discussing increasing Duloxetine with psychiatrist.

## 2024-01-28 NOTE — Assessment & Plan Note (Signed)
 Chronic, ongoing.  Not a current smoker.  Does not feel like he needs Pulmonology anymore due to not smoking.  Using Albuterol weekly.   Continue Albuterol as needed at this time.

## 2024-01-28 NOTE — Assessment & Plan Note (Addendum)
 On 80 mg Atorvastatin.  Continue present medications. Labs ordered today.  Follow up in 6 months.

## 2024-01-28 NOTE — Assessment & Plan Note (Signed)
 Labs ordered at visit today.  Will make recommendations based on lab results.  Last A1c was 6.2%.

## 2024-01-28 NOTE — Assessment & Plan Note (Signed)
 Chronic, ongoing.  Followed by psychiatry.  Continue this collaboration.  Recent notes reviewed.  Denies SI/HI.

## 2024-01-28 NOTE — Assessment & Plan Note (Signed)
 Chronic, ongoing.  Followed by oncology.  Continue this collaboration.  Recent notes and labs reviewed.  Has elevated not to do more Chemo. Will have repeat PET scan in 3 months.

## 2024-01-28 NOTE — Progress Notes (Signed)
 BP 113/74 (BP Location: Left Arm, Patient Position: Sitting, Cuff Size: Normal)   Pulse 94   Temp 98.6 F (37 C) (Oral)   Ht 6\' 2"  (1.88 m)   Wt 233 lb (105.7 kg)   SpO2 95%   BMI 29.92 kg/m    Subjective:    Patient ID: Russell Simpson, male    DOB: [birth year], 63 y.o.   MRN: [id number]  HPI: Russell Simpson is a 63 y.o. male presenting on 01/28/2024 for comprehensive medical examination. Current medical complaints include: back pain  He currently lives with: Interim Problems from his last visit: no.  Patient states he is still having back pain.  He is seeing [facility] and they plan to do an injection at his next visit.   Patient states he would like to try something for his neuropathy.    He was recently hospitalized with Sepsis and AKI.  Doing well since hospitalization.   HYPERTENSION / HYPERLIPIDEMIA Continues on Amlodipine, Metoprolol, and Atorvastatin.  Has history of elevations in sugars in past.   A1c in January 2024 was 6.2%.  Satisfied with current treatment? yes Duration of hypertension: chronic BP monitoring frequency: not checking BP range:  BP medication side effects: no Duration of hyperlipidemia: chronic Cholesterol medication side effects: no Cholesterol supplements: none Medication compliance: good compliance Aspirin: no Recent stressors:  Recurrent headaches: no Visual changes: no Palpitations: no Dyspnea: no Chest pain: no Lower extremity edema: no Dizzy/lightheaded: no   COPD Patient is not longer going to do treatment for his Follicular lymphoma.  He decided not to do treatment after his recently hospitalization.  He will be monitored over the next 3 months then do a PET scan in 3 months.  He does still have his Port in place.  Breathing has improved since recently hospitalization.  He quit smoking and stopped seeing Pulmonologist.  COPD status: stable Satisfied with current treatment?: yes Oxygen use: no Dyspnea frequency:  no Cough frequency: no Rescue inhaler frequency: weekly Limitation of activity: no Productive cough: none Last Spirometry: with pulmonary Pneumovax: Up to Date Influenza: Up to Date   BIPOLAR DISORDER Continues to follow with psychiatry, last visit 05/28/23.  No changes at that time.  Did have visit with endocrinology back in May 2023, had TSH check at time which was elevated and was to be rechecked, this was missed by patient.   Mood status: stable Satisfied with current treatment?: yes Symptom severity: mild  Duration of current treatment : chronic Side effects: no Medication compliance: good compliance Psychotherapy/counseling: no Depressed mood: no Anxious mood: no Anhedonia: no Significant weight loss or gain: no Insomnia: no Fatigue: yes Feelings of worthlessness or guilt: no Impaired concentration/indecisiveness: no Suicidal ideations: no Hopelessness: no Crying spells: no   The patient does not have a history of falls. I did complete a risk assessment for falls. A plan of care for falls was documented.   Past Medical History:  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HCC)    Arthritis    Asthma    Basal cell carcinoma 01/28/2021   R upper arm, EDC 03/04/2021   Basal cell carcinoma 12/31/2022   Right temporal hair line. Nodular. 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/2022   left upper back ED&C done   Hypertension    Hyponatremia    Inappropriate sinus tachycardia (HCC)    Non Hodgkin's lymphoma (HCC)    Pneumonia    RSV (respiratory syncytial virus infection)    SCC (squamous cell carcinoma) 08/28/2021   upper chest right of midline, EDC done 09/17/2021   SCC (squamous cell carcinoma) 09/30/2022   left chest  ED&C done   Sleep apnea    does not wear CPAP ; uses humidifier instead   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   right upper back, ED&C 06/18/2022   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   right upper back ED&C done   Squamous cell carcinoma of skin 02/19/2021   L inferior mandible, treated with EDC   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/28/2021   R ant shoulder - ED&C   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/28/2021   L upper abdomen - ED&C    Surgical History:  Past Surgical History:  Procedure Laterality Date   ANTERIOR CERVICAL DECOMP/DISCECTOMY FUSION N/A 06/17/2016   Procedure: ANTERIOR CERVICAL DECOMPRESSION FUSION, CERVICAL 3-4, CERVICAL 4-5 WITH INSTRUMENTATION AND ALLOGRAFT;  Surgeon: Estill Bamberg, MD;  Location: [facility] OR;  Service: Orthopedics;  Laterality: N/A;  ANTERIOR CERVICAL DECOMPRESSION FUSION, CERVICAL 3-4, CERVICAL 4-5 WITH INSTRUMENTATION AND ALLOGRAFT   BACK SURGERY     x3   COLONOSCOPY WITH PROPOFOL N/A 12/10/2023   Procedure: COLONOSCOPY WITH PROPOFOL;  Surgeon: Toney Reil, MD;  Location: [facility] ENDOSCOPY;  Service: Gastroenterology;  Laterality: N/A;   HARDWARE REMOVAL Right 05/04/2023   Procedure: HARDWARE REMOVAL;  Surgeon: Juanell Fairly, MD;  Location: [facility] ORS;  Service: Orthopedics;  Laterality: Right;   HEMOSTASIS CLIP PLACEMENT  12/10/2023   Procedure: HEMOSTASIS CLIP PLACEMENT;  Surgeon: Toney Reil, MD;  Location: [facility] ENDOSCOPY;  Service: Gastroenterology;;   HEMOSTASIS CONTROL  12/10/2023   Procedure: HEMOSTASIS CONTROL;  Surgeon:  Toney Reil, MD;  Location: [facility] ENDOSCOPY;  Service: Gastroenterology;;   NECK SURGERY  12/01/2007   ORIF ANKLE FRACTURE Right 12/02/2022   Procedure: OPEN REDUCTION INTERNAL FIXATION (ORIF) ANKLE FRACTURE;  Surgeon: Juanell Fairly, MD;  Location: [facility] ORS;  Service: Orthopedics;  Laterality: Right;   ORIF ANKLE FRACTURE Right 12/01/2022   Procedure: OPEN REDUCTION INTERNAL FIXATION (ORIF) ANKLE FRACTURE;  Surgeon: Juanell Fairly, MD;  Location: [facility] ORS;  Service: Orthopedics;  Laterality: Right;   POLYPECTOMY  12/10/2023   Procedure: POLYPECTOMY;  Surgeon: Toney Reil, MD;  Location: [facility] ENDOSCOPY;  Service: Gastroenterology;;   PORTA CATH INSERTION N/A 04/03/2021   Procedure: PORTA CATH INSERTION;  Surgeon: Annice Needy, MD;  Location: [facility] INVASIVE CV LAB;  Service: Cardiovascular;  Laterality: N/A;   SUBMUCOSAL LIFTING INJECTION  12/10/2023   Procedure: SUBMUCOSAL LIFTING INJECTION;  Surgeon: Toney Reil, MD;  Location: [facility] ENDOSCOPY;  Service: Gastroenterology;;   SUBMUCOSAL TATTOO INJECTION  12/10/2023   Procedure: SUBMUCOSAL TATTOO INJECTION;  Surgeon: Toney Reil, MD;  Location: [facility]  ENDOSCOPY;  Service: Gastroenterology;;   VIDEO BRONCHOSCOPY WITH ENDOBRONCHIAL ULTRASOUND Bilateral 10/14/2022   Procedure: VIDEO BRONCHOSCOPY WITH ENDOBRONCHIAL ULTRASOUND;  Surgeon: Salena Saner, MD;  Location: [facility] ORS;  Service: Pulmonary;  Laterality: Bilateral;    Medications:  Current Outpatient Medications on File Prior to Visit  Medication Sig   albuterol (VENTOLIN HFA) 108 (90 Base) MCG/ACT inhaler Inhale 2 puffs into the lungs every 6 (six) hours as needed for wheezing or shortness of breath.   atorvastatin (LIPITOR) 80 MG tablet Take 1 tablet (80 mg total) by mouth daily.   buPROPion (WELLBUTRIN XL) 300 MG 24 hr tablet Take 300 mg by mouth daily.   cetirizine (ZYRTEC) 10 MG tablet Take 1 tablet (10 mg total) by mouth daily.   clonazePAM (KLONOPIN) 0.5  MG tablet Take 0.5 mg by mouth every evening.   diphenhydrAMINE (BENADRYL) 25 mg capsule Starting 2 days prior to your chemotherapy infusions, take 1 pill (25 mg) by mouth daily. Do not take on day of chemotherapy.   DULoxetine (CYMBALTA) 60 MG capsule Take 60 mg by mouth every evening.   famotidine (PEPCID) 20 MG tablet Starting 2 days prior to your chemotherapy infusions, take 1 pill (20 mg) by mouth daily. Do not take on day of chemotherapy.   finasteride (PROSCAR) 5 MG tablet TAKE 1 TABLET BY MOUTH ONCE DAILY   fluticasone (FLONASE) 50 MCG/ACT nasal spray Place 2 sprays into both nostrils daily.   metoprolol succinate (TOPROL-XL) 100 MG 24 hr tablet TAKE 1 TABLET BY MOUTH ONCE DAILY WITH OR IMMEDIATELY FOLLOWING A MEAL   montelukast (SINGULAIR) 10 MG tablet TAKE 1 TABLET EVERY DAY   ondansetron (ZOFRAN) 4 MG tablet Take 1 tablet (4 mg total) by mouth every 8 (eight) hours as needed for nausea or vomiting.   sildenafil (VIAGRA) 100 MG tablet Take 1 tablet (100 mg total) by mouth daily as needed for erectile dysfunction. Take two hours prior to intercourse on an empty stomach   testosterone cypionate (DEPOTESTOSTERONE CYPIONATE) 200 MG/ML injection Inject 1 mL (200 mg total) into the muscle every 14 (fourteen) days. INJECT INTRAMUSCULARLY EVERY 14 DAYS   lenalidomide (REVLIMID) 15 MG capsule TAKE 1 CAPSULE BY MOUTH DAILY FOR 21 DAYS, THEN HOLD FOR 7 DAYS. REPEAT EVERY 28 DAYS   lenalidomide (REVLIMID) 15 MG capsule Take 1 capsule (15 mg total) by mouth daily. Take for 21 days, then hold for 7 days. Repeat every 28 days.   mirtazapine (REMERON) 15 MG tablet Take 15 mg by mouth at bedtime.   naltrexone (DEPADE) 50 MG tablet Take 50 mg by mouth daily.   tenofovir alafenamide (VEMLIDY) 25 MG tablet Take 1 tablet (25 mg total) by mouth daily.   Current Facility-Administered Medications on File Prior to Visit  Medication   heparin lock flush 100 unit/mL   heparin lock flush 100 unit/mL   sodium  chloride flush (NS) 0.9 % injection 10 mL    Allergies:  Allergies  Allergen Reactions   Penicillins Anaphylaxis    Tolerates cefdinir, cephalexin and amoxicillin/clavulonate so true PCN allergy unlikely   Tetanus Toxoids Swelling   Lisinopril Cough   Losartan      Other reaction(s): Muscle Pain     Codeine Nausea Only and Nausea And Vomiting   Doxycycline Rash   Ruxience [Rituximab-Pvvr] Nausea And Vomiting, Rash and Other (See Comments)    05/06/21 pt w/ worsening rash during Ruxience infusion.  Face, neck and chest flushing redness  12/27/2023: flushing, diaphoresis, nausea and  vomiting    Social History:  Social History   Socioeconomic History   Marital status: Divorced    Spouse name: Not on file   Number of children: Not on file   Years of education: Not on file   Highest education level: Not on file  Occupational History   Not on file  Tobacco Use   Smoking status: Former    Types: Cigars    Quit date: 11/30/2022    Years since quitting: 1.1   Smokeless tobacco: Never  Vaping Use   Vaping status: Former   Start date: 04/30/2017   Quit date: 11/30/2017  Substance and Sexual Activity   Alcohol use: Yes    Comment: beers 6 a day   Drug use: No   Sexual activity: Not Currently  Other Topics Concern   Not on file  Social History Narrative   Not on file   Social Drivers of Health   Financial Resource Strain: Medium Risk (04/05/2023)   Overall Financial Resource Strain (CARDIA)    Difficulty of Paying Living Expenses: Somewhat hard  Food Insecurity: No Food Insecurity (01/13/2024)   Hunger Vital Sign    Worried About Running Out of Food in the Last Year: Never true    Ran Out of Food in the Last Year: Never true  Transportation Needs: No Transportation Needs (01/13/2024)   PRAPARE - Administrator, Civil Service (Medical): No    Lack of Transportation (Non-Medical): No  Physical Activity: Inactive (01/01/2023)   Exercise Vital Sign    Days of Exercise  per Week: 0 days    Minutes of Exercise per Session: 0 min  Stress: No Stress Concern Present (04/05/2023)   [name] of Occupational Health - Occupational Stress Questionnaire    Feeling of Stress : Only a little  Social Connections: Socially Isolated (04/05/2023)   Social Connection and Isolation Panel [NHANES]    Frequency of Communication with Friends and Family: More than three times a week    Frequency of Social Gatherings with Friends and Family: Twice a week    Attends Religious Services: Never    Database administrator or Organizations: No    Attends Banker Meetings: Never    Marital Status: Divorced  Catering manager Violence: Not At Risk (01/13/2024)   Humiliation, Afraid, Rape, and Kick questionnaire    Fear of Current or Ex-Partner: No    Emotionally Abused: No    Physically Abused: No    Sexually Abused: No   Social History   Tobacco Use  Smoking Status Former   Types: Cigars   Quit date: 11/30/2022   Years since quitting: 1.1  Smokeless Tobacco Never   Social History   Substance and Sexual Activity  Alcohol Use Yes   Comment: beers 6 a day    Family History:  Family History  Problem Relation Age of Onset   Diabetes Mother    Heart attack Father    Emphysema Father    Diabetes Other     Past medical history, surgical history, medications, allergies, family history and social history reviewed with patient today and changes made to appropriate areas of the chart.   Review of Systems  Eyes:  Negative for blurred vision and double vision.  Respiratory:  Negative for shortness of breath.   Cardiovascular:  Negative for chest pain, palpitations and leg swelling.  Musculoskeletal:  Positive for back pain.  Neurological:  Negative for dizziness and headaches.  Burning pain in legs and back  Psychiatric/Behavioral:  Positive for depression. Negative for suicidal ideas. The patient is nervous/anxious.    All other ROS negative except  what is listed above and in the HPI.      Objective:    BP 113/74 (BP Location: Left Arm, Patient Position: Sitting, Cuff Size: Normal)   Pulse 94   Temp 98.6 F (37 C) (Oral)   Ht 6\' 2"  (1.88 m)   Wt 233 lb (105.7 kg)   SpO2 95%   BMI 29.92 kg/m   Wt Readings from Last 3 Encounters:  01/28/24 233 lb (105.7 kg)  01/17/24 232 lb 14.4 oz (105.6 kg)  01/10/24 237 lb 7 oz (107.7 kg)    Physical Exam Vitals and nursing note reviewed.  Constitutional:      General: He is not in acute distress.    Appearance: Normal appearance. He is not ill-appearing, toxic-appearing or diaphoretic.  HENT:     Head: Normocephalic.     Right Ear: Tympanic membrane, ear canal and external ear normal.     Left Ear: Tympanic membrane, ear canal and external ear normal.     Nose: Nose normal. No congestion or rhinorrhea.     Mouth/Throat:     Mouth: Mucous membranes are moist.  Eyes:     General:        Right eye: No discharge.        Left eye: No discharge.     Extraocular Movements: Extraocular movements intact.     Conjunctiva/sclera: Conjunctivae normal.     Pupils: Pupils are equal, round, and reactive to light.  Cardiovascular:     Rate and Rhythm: Normal rate and regular rhythm.     Heart sounds: No murmur heard. Pulmonary:     Effort: Pulmonary effort is normal. No respiratory distress.     Breath sounds: Normal breath sounds. No wheezing, rhonchi or rales.  Abdominal:     General: Abdomen is flat. Bowel sounds are normal. There is no distension.     Palpations: Abdomen is soft.     Tenderness: There is no abdominal tenderness. There is no guarding.  Musculoskeletal:     Cervical back: Normal range of motion and neck supple.  Skin:    General: Skin is warm and dry.     Capillary Refill: Capillary refill takes less than 2 seconds.  Neurological:     General: No focal deficit present.     Mental Status: He is alert and oriented to person, place, and time.     Cranial Nerves: No  cranial nerve deficit.     Motor: No weakness.     Deep Tendon Reflexes: Reflexes normal.  Psychiatric:        Mood and Affect: Mood normal.        Behavior: Behavior normal.        Thought Content: Thought content normal.        Judgment: Judgment normal.     Results for orders placed or performed in visit on 01/20/24  Testosterone   Collection Time: 01/20/24 10:13 AM  Result Value Ref Range   Testosterone 398 264 - 916 ng/dL  Hemoglobin and Hematocrit, Blood   Collection Time: 01/20/24 10:13 AM  Result Value Ref Range   Hemoglobin 11.9 (L) 13.0 - 17.7 g/dL   Hematocrit 86.5 (L) 78.4 - 51.0 %   *Note: Due to a large number of results and/or encounters for the requested time period, some results have not  been displayed. A complete set of results can be found in Results Review.      Assessment & Plan:   Problem List Items Addressed This Visit       Cardiovascular and Mediastinum   Aortic atherosclerosis (HCC)   Chronic.  Controlled.  Continue with current medication regimen of Atorvastatin 80mg  daily.  Labs ordered today.  Return to clinic in 6 months for reevaluation.  Call sooner if concerns arise.        Essential hypertension   Chronic, controlled.  Recommend he monitor BP at least a few mornings a week at home and document.  DASH diet at home.  Continue current medication regimen and adjust as needed.  Labs today: up to date with oncology.  Refills as needed.          Respiratory   Stage 2 moderate COPD by GOLD classification (HCC)   Chronic, ongoing.  Not a current smoker.  Does not feel like he needs Pulmonology anymore due to not smoking.  Using Albuterol weekly.   Continue Albuterol as needed at this time.        Endocrine   Hypothyroidism   Repeat labs ordered today.  Did not follow up with Endocrinology.        Relevant Orders   T4, free     Nervous and Auditory   Neuropathy   Chronic.  Will start Gabapentin 100mg  at bedtime.  Side effects and  benefits of medication discussed during visit.  Follow up in 3 months.  If tolerating well, can consider increasing.  Also recommend discussing increasing Duloxetine with psychiatrist.        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  Followed by oncology.  Continue this collaboration.  Recent notes and labs reviewed.  Has elevated not to do more Chemo. Will have repeat PET scan in 3 months.      Relevant Medications   gabapentin (NEURONTIN) 100 MG capsule     Other   Hyperlipidemia   On 80 mg Atorvastatin.  Continue present medications. Labs ordered today.  Follow up in 6 months.        Relevant Orders   Lipid panel   Mixed bipolar I disorder (HCC)   Chronic, ongoing.  Followed by psychiatry.  Continue this collaboration.  Recent notes reviewed.  Denies SI/HI.      Elevated hemoglobin A1c measurement   Labs ordered at visit today.  Will make recommendations based on lab results.  Last A1c was 6.2%.      Relevant Orders   HgB A1c   Other Visit Diagnoses       Annual physical exam    -  Primary   Health maintenance reviewed during visit today.  Labs ordered.  Vaccines reviewed.  Colonoscopy up to date.   Relevant Orders   TSH   PSA   Lipid panel   CBC with Differential/Platelet   Comprehensive metabolic panel   Urinalysis, Routine w reflex microscopic   HgB A1c   T4, free     Need for vaccination against Streptococcus pneumoniae       Relevant Orders   Pneumococcal conjugate vaccine 20-valent (Prevnar 20)        Discussed aspirin prophylaxis for myocardial infarction prevention and decision was it was not indicated  LABORATORY TESTING:  Health maintenance labs ordered today as discussed above.   The natural history of prostate cancer and ongoing controversy regarding  screening and potential treatment outcomes of prostate cancer has been discussed with the patient. The meaning of a false positive PSA  and a false negative PSA has been discussed. He indicates understanding of the limitations of this screening test and wishes to proceed with screening PSA testing.   IMMUNIZATIONS:   - Tdap: Tetanus vaccination status reviewed: last tetanus booster within 10 years. - Influenza: Up to date - Pneumovax: Administered today - Prevnar: Administered today - COVID: Up to date - HPV: Not applicable - Shingrix vaccine:  Discussed at visit today  SCREENING: - Colonoscopy: Up to date  Discussed with patient purpose of the colonoscopy is to detect colon cancer at curable precancerous or early stages   - AAA Screening: Not applicable  -Hearing Test: Not applicable  -Spirometry: Not applicable   PATIENT COUNSELING:    Sexuality: Discussed sexually transmitted diseases, partner selection, use of condoms, avoidance of unintended pregnancy  and contraceptive alternatives.   Advised to avoid cigarette smoking.  I discussed with the patient that most people either abstain from alcohol or drink within safe limits (<=14/week and <=4 drinks/occasion for males, <=7/weeks and <= 3 drinks/occasion for females) and that the risk for alcohol disorders and other health effects rises proportionally with the number of drinks per week and how often a drinker exceeds daily limits.  Discussed cessation/primary prevention of drug use and availability of treatment for abuse.   Diet: Encouraged to adjust caloric intake to maintain  or achieve ideal body weight, to reduce intake of dietary saturated fat and total fat, to limit sodium intake by avoiding high sodium foods and not adding table salt, and to maintain adequate dietary potassium and calcium preferably from fresh fruits, vegetables, and low-fat dairy products.    stressed the importance of regular exercise  Injury prevention: Discussed safety belts, safety helmets, smoke detector, smoking near bedding or upholstery.   Dental health: Discussed importance of  regular tooth brushing, flossing, and dental visits.   Follow up plan: NEXT PREVENTATIVE PHYSICAL DUE IN 1 YEAR. Return in about 3 months (around 04/26/2024) for HTN, HLD, DM2 FU.

## 2024-01-28 NOTE — Assessment & Plan Note (Signed)
 Chronic, controlled.  Recommend he monitor BP at least a few mornings a week at home and document.  DASH diet at home.  Continue current medication regimen and adjust as needed.  Labs today: up to date with oncology.  Refills as needed.

## 2024-01-28 NOTE — Assessment & Plan Note (Signed)
Chronic.  Controlled.  Continue with current medication regimen of Atorvastatin '80mg'$  daily.  Labs ordered today.  Return to clinic in 6 months for reevaluation.  Call sooner if concerns arise.

## 2024-01-28 NOTE — Telephone Encounter (Signed)
 Patient left a message on triage line about his medication again, I reviewed notes and called patient back and left a message asking patient to answer Shannon's question, when did patient do his testosterone injection before his lab appointment on 01/20/24. Advised patient on the voicemail we are not able to proceed with refill until Carollee Herter has this information to know what adjustment if any should be at this time

## 2024-01-29 LAB — LIPID PANEL
Chol/HDL Ratio: 4.8 ratio (ref 0.0–5.0)
Cholesterol, Total: 139 mg/dL (ref 100–199)
HDL: 29 mg/dL — ABNORMAL LOW (ref >39)
LDL Chol Calc (NIH): 88 mg/dL (ref 0–99)
Triglycerides: 119 mg/dL (ref 0–149)
VLDL Cholesterol Cal: 22 mg/dL (ref 5–40)

## 2024-01-29 LAB — COMPREHENSIVE METABOLIC PANEL
ALT: 17 IU/L (ref 0–44)
AST: 16 IU/L (ref 0–40)
Albumin: 4.2 g/dL (ref 3.9–4.9)
Alkaline Phosphatase: 193 IU/L — ABNORMAL HIGH (ref 44–121)
BUN/Creatinine Ratio: 11 (ref 10–24)
BUN: 11 mg/dL (ref 8–27)
Bilirubin Total: 0.6 mg/dL (ref 0.0–1.2)
CO2: 23 mmol/L (ref 20–29)
Calcium: 9.5 mg/dL (ref 8.6–10.2)
Chloride: 97 mmol/L (ref 96–106)
Creatinine, Ser: 0.98 mg/dL (ref 0.76–1.27)
Globulin, Total: 1.6 g/dL (ref 1.5–4.5)
Glucose: 126 mg/dL — ABNORMAL HIGH (ref 70–99)
Potassium: 4.5 mmol/L (ref 3.5–5.2)
Sodium: 135 mmol/L (ref 134–144)
Total Protein: 5.8 g/dL — ABNORMAL LOW (ref 6.0–8.5)
eGFR: 88 mL/min/{1.73_m2} (ref >59)

## 2024-01-29 LAB — CBC WITH DIFFERENTIAL/PLATELET
Basophils Absolute: 0 10*3/uL (ref 0.0–0.2)
Basos: 0 %
EOS (ABSOLUTE): 0.4 10*3/uL (ref 0.0–0.4)
Eos: 3 %
Hematocrit: 34.5 % — ABNORMAL LOW (ref 37.5–51.0)
Hemoglobin: 11.4 g/dL — ABNORMAL LOW (ref 13.0–17.7)
Immature Grans (Abs): 0.2 10*3/uL — ABNORMAL HIGH (ref 0.0–0.1)
Immature Granulocytes: 2 %
Lymphocytes Absolute: 6.8 10*3/uL — ABNORMAL HIGH (ref 0.7–3.1)
Lymphs: 54 %
MCH: 30.7 pg (ref 26.6–33.0)
MCHC: 33 g/dL (ref 31.5–35.7)
MCV: 93 fL (ref 79–97)
Monocytes Absolute: 1.2 10*3/uL — ABNORMAL HIGH (ref 0.1–0.9)
Monocytes: 9 %
Neutrophils Absolute: 4.1 10*3/uL (ref 1.4–7.0)
Neutrophils: 32 %
Platelets: 114 10*3/uL — ABNORMAL LOW (ref 150–450)
RBC: 3.71 x10E6/uL — ABNORMAL LOW (ref 4.14–5.80)
RDW: 12.9 % (ref 11.6–15.4)
WBC: 12.7 10*3/uL — ABNORMAL HIGH (ref 3.4–10.8)

## 2024-01-29 LAB — HEMOGLOBIN A1C
Est. average glucose Bld gHb Est-mCnc: 126 mg/dL
Hgb A1c MFr Bld: 6 % — ABNORMAL HIGH (ref 4.8–5.6)

## 2024-01-29 LAB — T4, FREE: Free T4: 1.24 ng/dL (ref 0.82–1.77)

## 2024-01-29 LAB — PSA: Prostate Specific Ag, Serum: 1.6 ng/mL (ref 0.0–4.0)

## 2024-01-29 LAB — TSH: TSH: 4.26 u[IU]/mL (ref 0.450–4.500)

## 2024-01-31 ENCOUNTER — Telehealth: Payer: Self-pay

## 2024-01-31 ENCOUNTER — Inpatient Hospital Stay: Payer: Medicare HMO | Attending: Oncology

## 2024-01-31 ENCOUNTER — Ambulatory Visit: Payer: Medicare HMO

## 2024-01-31 ENCOUNTER — Other Ambulatory Visit: Payer: Self-pay | Admitting: Urology

## 2024-01-31 ENCOUNTER — Ambulatory Visit: Payer: Medicare HMO | Admitting: Oncology

## 2024-01-31 ENCOUNTER — Encounter: Payer: Self-pay | Admitting: Nurse Practitioner

## 2024-01-31 ENCOUNTER — Other Ambulatory Visit: Payer: Medicare HMO

## 2024-01-31 DIAGNOSIS — C8202 Follicular lymphoma grade I, intrathoracic lymph nodes: Secondary | ICD-10-CM | POA: Diagnosis present

## 2024-01-31 DIAGNOSIS — E291 Testicular hypofunction: Secondary | ICD-10-CM

## 2024-01-31 LAB — CBC WITH DIFFERENTIAL/PLATELET
Abs Immature Granulocytes: 0.21 10*3/uL — ABNORMAL HIGH (ref 0.00–0.07)
Basophils Absolute: 0 10*3/uL (ref 0.0–0.1)
Basophils Relative: 0 %
Eosinophils Absolute: 0.4 10*3/uL (ref 0.0–0.5)
Eosinophils Relative: 4 %
HCT: 33.4 % — ABNORMAL LOW (ref 39.0–52.0)
Hemoglobin: 11.3 g/dL — ABNORMAL LOW (ref 13.0–17.0)
Immature Granulocytes: 2 %
Lymphocytes Relative: 56 %
Lymphs Abs: 5.8 10*3/uL — ABNORMAL HIGH (ref 0.7–4.0)
MCH: 30.9 pg (ref 26.0–34.0)
MCHC: 33.8 g/dL (ref 30.0–36.0)
MCV: 91.3 fL (ref 80.0–100.0)
Monocytes Absolute: 0.9 10*3/uL (ref 0.1–1.0)
Monocytes Relative: 9 %
Neutro Abs: 3 10*3/uL (ref 1.7–7.7)
Neutrophils Relative %: 29 %
Platelets: 109 10*3/uL — ABNORMAL LOW (ref 150–400)
RBC: 3.66 MIL/uL — ABNORMAL LOW (ref 4.22–5.81)
RDW: 13.3 % (ref 11.5–15.5)
Smear Review: NORMAL
WBC: 10.3 10*3/uL (ref 4.0–10.5)
nRBC: 0 % (ref 0.0–0.2)

## 2024-01-31 LAB — CMP (CANCER CENTER ONLY)
ALT: 19 U/L (ref 0–44)
AST: 22 U/L (ref 15–41)
Albumin: 3.7 g/dL (ref 3.5–5.0)
Alkaline Phosphatase: 125 U/L (ref 38–126)
Anion gap: 12 (ref 5–15)
BUN: 13 mg/dL (ref 8–23)
CO2: 22 mmol/L (ref 22–32)
Calcium: 9 mg/dL (ref 8.9–10.3)
Chloride: 100 mmol/L (ref 98–111)
Creatinine: 1.05 mg/dL (ref 0.61–1.24)
GFR, Estimated: >60 mL/min (ref >60)
Glucose, Bld: 181 mg/dL — ABNORMAL HIGH (ref 70–99)
Potassium: 4.3 mmol/L (ref 3.5–5.1)
Sodium: 134 mmol/L — ABNORMAL LOW (ref 135–145)
Total Bilirubin: 0.7 mg/dL (ref 0.0–1.2)
Total Protein: 5.9 g/dL — ABNORMAL LOW (ref 6.5–8.1)

## 2024-01-31 MED ORDER — TESTOSTERONE CYPIONATE 200 MG/ML IM SOLN: 200.0000 mg | INTRAMUSCULAR | 0 refills | Status: DC

## 2024-01-31 NOTE — Telephone Encounter (Signed)
 Pt LM on the triage stating he needs a PA for his testosterone. Called pharmacy to send [place] a request for PA. I called [facility] and they stated they don't have his insurance information, pt will need to go to his pharmacy and give them his insurance information.   LVM for pt to return call.

## 2024-02-01 ENCOUNTER — Ambulatory Visit: Payer: Medicare HMO

## 2024-02-01 ENCOUNTER — Other Ambulatory Visit: Payer: Self-pay | Admitting: Urology

## 2024-02-01 DIAGNOSIS — E291 Testicular hypofunction: Secondary | ICD-10-CM

## 2024-02-01 LAB — URINALYSIS, ROUTINE W REFLEX MICROSCOPIC
Bilirubin, UA: NEGATIVE
Glucose, UA: NEGATIVE
Ketones, UA: NEGATIVE
Leukocytes,UA: NEGATIVE
Nitrite, UA: NEGATIVE
Protein,UA: NEGATIVE
RBC, UA: NEGATIVE
Specific Gravity, UA: 1.015 (ref 1.005–1.030)
Urobilinogen, Ur: 0.2 mg/dL (ref 0.2–1.0)
pH, UA: 6.5 (ref 5.0–7.5)

## 2024-02-01 NOTE — Telephone Encounter (Signed)
 I called the pharmacy to see if they could send a PA request. Pharmacy states pt picked up the testosterone mediation and paid out of pocket. No PA required right now until the pt gets refills.   Pt was informed, voice understanding.

## 2024-02-01 NOTE — Telephone Encounter (Signed)
 Pt LM on triage line stating that he contacted [facility] today and they informed him that a PA needs to be completed on our end.

## 2024-02-01 NOTE — Telephone Encounter (Signed)
 Pt returned call and I read message to him about [facility] not having his insurance information.  He will get it to them.

## 2024-02-02 ENCOUNTER — Other Ambulatory Visit: Payer: Self-pay | Admitting: Nurse Practitioner

## 2024-02-02 ENCOUNTER — Ambulatory Visit: Payer: Medicare HMO

## 2024-02-03 NOTE — Telephone Encounter (Signed)
 Requested Prescriptions  Pending Prescriptions Disp Refills   metoprolol succinate (TOPROL-XL) 100 MG 24 hr tablet [Pharmacy Med Name: METOPROLOL SUCCINATE ER 100 MG TAB] 90 tablet 0    Sig: TAKE 1 TABLET BY MOUTH ONCE DAILY WITH OR IMMEDIATELY FOLLOWING A MEAL     Cardiovascular:  Beta Blockers Passed - 02/03/2024 11:23 AM      Passed - Last BP in normal range    BP Readings from Last 1 Encounters:  01/28/24 113/74         Passed - Last Heart Rate in normal range    Pulse Readings from Last 1 Encounters:  01/28/24 94         Passed - Valid encounter within last 6 months    Recent Outpatient Visits           1 month ago Acute non-recurrent frontal sinusitis   [place] [facility] [place], Corrie Dandy T, NP   3 months ago Community acquired pneumonia, unspecified laterality   [place] [facility] Jackolyn Confer, MD   3 months ago Mouth ulcer   [place] [facility] [name], NP   6 months ago 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 [place], Corrie Dandy T, NP   7 months ago Paresthesia of foot, bilateral   [place] [facility] [place], Sherran Needs, NP       Future Appointments             In 2 weeks Elie Goody, MD [facility]   In 2 months [name], NP [facility]   In 2 months [place], Elana Alm [facility] [place]

## 2024-02-04 LAB — COMP PANEL: LEUKEMIA/LYMPHOMA: Immunophenotypic Profile: 3

## 2024-02-05 ENCOUNTER — Other Ambulatory Visit: Payer: Self-pay | Admitting: Oncology

## 2024-02-05 DIAGNOSIS — R768 Other specified abnormal immunological findings in serum: Secondary | ICD-10-CM

## 2024-02-17 ENCOUNTER — Encounter: Payer: Self-pay | Admitting: Dermatology

## 2024-02-17 ENCOUNTER — Ambulatory Visit: Payer: Medicare HMO | Admitting: Dermatology

## 2024-02-17 DIAGNOSIS — D099 Carcinoma in situ, unspecified: Secondary | ICD-10-CM

## 2024-02-17 DIAGNOSIS — L649 Androgenic alopecia, unspecified: Secondary | ICD-10-CM | POA: Diagnosis not present

## 2024-02-17 DIAGNOSIS — M7918 Myalgia, other site: Secondary | ICD-10-CM | POA: Insufficient documentation

## 2024-02-17 DIAGNOSIS — D0461 Carcinoma in situ of skin of right upper limb, including shoulder: Secondary | ICD-10-CM

## 2024-02-17 DIAGNOSIS — D044 Carcinoma in situ of skin of scalp and neck: Secondary | ICD-10-CM

## 2024-02-17 DIAGNOSIS — D492 Neoplasm of unspecified behavior of bone, soft tissue, and skin: Secondary | ICD-10-CM

## 2024-02-17 DIAGNOSIS — M47816 Spondylosis without myelopathy or radiculopathy, lumbar region: Secondary | ICD-10-CM | POA: Insufficient documentation

## 2024-02-17 DIAGNOSIS — D0471 Carcinoma in situ of skin of right lower limb, including hip: Secondary | ICD-10-CM

## 2024-02-17 HISTORY — DX: Carcinoma in situ, unspecified: D09.9

## 2024-02-17 NOTE — Progress Notes (Signed)
 Follow-Up Visit   Subjective  Russell Simpson is a 63 y.o. male who presents for the following: Ak f/u, pt reports places at posterior neck, posterior upper R arm, R lower leg that are sore to the touch. Patient also wanting to discuss if nutrafol works for hair thinning.  The patient has spots, moles and lesions to be evaluated, some may be new or changing and the patient may have concern these could be cancer.   The following portions of the chart were reviewed this encounter and updated as appropriate: medications, allergies, medical history  Review of Systems:  No other skin or systemic complaints except as noted in HPI or Assessment and Plan.  Objective  Well appearing patient in no apparent distress; mood and affect are within normal limits.  A focused examination was performed of the following areas: R leg, R arm, neck  Relevant exam findings are noted in the Assessment and Plan.  R lower anterior leg distal 7 mm pink scaly macule  R posterior neck lateral 6 mm pink scaly macule  R upper extensor arm 8 mm thin pink plaque with prominent vessels   Assessment & Plan    NEOPLASM OF SKIN (3) R lower anterior leg distal Skin / nail biopsy Type of biopsy: tangential   Informed consent: discussed and consent obtained   Timeout: patient name, date of birth, surgical site, and procedure verified   Procedure prep:  Patient was prepped and draped in usual sterile fashion Prep type:  Isopropyl alcohol Anesthesia: the lesion was anesthetized in a standard fashion   Anesthetic:  1% lidocaine w/ epinephrine 1-100,000 buffered w/ 8.4% NaHCO3 Instrument used: DermaBlade   Hemostasis achieved with: pressure and aluminum chloride   Outcome: patient tolerated procedure well   Post-procedure details: sterile dressing applied and wound care instructions given   Dressing type: bandage and petrolatum   Specimen 1 - Surgical pathology Differential Diagnosis: AK vs SCC vs  ISK  Check Margins: No R posterior neck lateral Skin / nail biopsy Type of biopsy: tangential   Informed consent: discussed and consent obtained   Timeout: patient name, date of birth, surgical site, and procedure verified   Procedure prep:  Patient was prepped and draped in usual sterile fashion Prep type:  Isopropyl alcohol Anesthesia: the lesion was anesthetized in a standard fashion   Anesthetic:  1% lidocaine w/ epinephrine 1-100,000 buffered w/ 8.4% NaHCO3 Instrument used: DermaBlade   Hemostasis achieved with: pressure and aluminum chloride   Outcome: patient tolerated procedure well   Post-procedure details: sterile dressing applied and wound care instructions given   Dressing type: bandage and petrolatum   Specimen 2 - Surgical pathology Differential Diagnosis: AK vs SCC vs ISK  Check Margins: No R upper extensor arm Skin / nail biopsy Type of biopsy: tangential   Informed consent: discussed and consent obtained   Timeout: patient name, date of birth, surgical site, and procedure verified   Procedure prep:  Patient was prepped and draped in usual sterile fashion Prep type:  Isopropyl alcohol Anesthesia: the lesion was anesthetized in a standard fashion   Anesthetic:  1% lidocaine w/ epinephrine 1-100,000 buffered w/ 8.4% NaHCO3 Instrument used: DermaBlade   Hemostasis achieved with: pressure and aluminum chloride   Outcome: patient tolerated procedure well   Post-procedure details: sterile dressing applied and wound care instructions given   Dressing type: bandage and petrolatum   Specimen 3 - Surgical pathology Differential Diagnosis: AK vs SCC vs ISK  Check Margins: No  ANDROGENIC ALOPECIA    ANDROGENETIC ALOPECIA (MALE PATTERN HAIR LOSS) Exam: Frontal and vertex scalp alopecia   Chronic and persistent condition with duration or expected duration over one year. Condition is bothersome/symptomatic for patient. Currently flared.   Androgenetic Alopecia (or  Male pattern hair loss) refers to the common patterned hair loss affecting many men.  Male pattern alopecia is mediated by dihydrotestosterone which induces miniaturization of androgen-sensitive hair follicles.  It is chronic and persistent, but treatable; not curable. Topical treatment includes: - 5% topical Minoxidil Oral treatment includes: - Minoxidil 1.25 - 5 mg qd   Treatment Plan: Discussed topical minoxidil and potential side effects including increase in hair growth elsewhere on face and body. Patent defers at this time.   Long term medication management.  Patient is using long term (months to years) prescription medication  to control their dermatologic condition.  These medications require periodic monitoring to evaluate for efficacy and side effects and may require periodic laboratory monitoring.    Return if symptoms worsen or fail to improve.  Wynonia Lawman, CMA, am acting as scribe for Elie Goody, MD .   Documentation: I have reviewed the above documentation for accuracy and completeness, and I agree with the above.  Elie Goody, MD

## 2024-02-17 NOTE — Patient Instructions (Addendum)

## 2024-02-18 ENCOUNTER — Ambulatory Visit: Payer: Medicare HMO | Admitting: Cardiology

## 2024-02-22 LAB — SURGICAL PATHOLOGY

## 2024-02-24 ENCOUNTER — Telehealth: Payer: Self-pay

## 2024-02-24 NOTE — Telephone Encounter (Signed)
 Left detailed message on patient's cell that all 3 biopsy sites were SCCis and Dr. Katrinka Blazing has treatment options we need to discuss with him. LM that we also need to schedule 6 month FBSE.

## 2024-02-24 NOTE — Telephone Encounter (Signed)
-----   Message from [facility] sent at 02/22/2024  6:15 PM EDT ----- Diagnosis 1. Skin, right lower anterior leg distal :       SQUAMOUS CELL CARCINOMA IN SITU, BASE INVOLVED        2. Skin, right posterior neck lateral :       SQUAMOUS CELL CARCINOMA IN SITU, BASE INVOLVED        3. Skin, right upper extensor arm :       SQUAMOUS CELL CARCINOMA IN SITU, BASE INVOLVED     Please call with diagnosis and message me with patient's decision on treatment. Please schedule FBSE 6 months   Explanation: biopsies show squamous cell skin cancers limited to the top layer of skin. This means they are early cancers and have not spread. However, they have the potential to spread beyond the skin and threaten your health, so we recommend treating them.   SITE 1 options: Treatment option 1: a cream (fluorouracil and calcipotriene) that helps your immune system clear the skin cancer. It will cause redness and irritation. Wait two weeks after the biopsy to start applying the cream. Apply the cream twice per day until the redness and irritation develop (usually occurs by day 7), then stop and allow it to heal. We will recheck the area in 2 months to ensure the cancer is gone. The cream is $45 plus shipping and will be mailed to you from a low cost compounding pharmacy.  Treatment option 2: you return for a brief appointment where I perform electrodesiccation and curettage [facility]). This involves three rounds of scraping and burning to destroy the skin cancer. It has about an 85% cure rate and leaves a round wound slightly larger than the skin cancer and leaves a round white scar. No additional pathology is done. If the skin cancer comes back, we would need to do a surgery to remove it.  Treatment option 3: Mohs surgery, which involves cutting out right around the skin cancer and then checking under the microscope on the same day to ensure the whole skin cancer is out. If there is more cancer remaining, the  surgeon will repeat the process until it is fully cleared. The cure rate is about 98-99%. It is done at another office outside of [place] ([place], [place], or [place]). Once the Mohs surgeon confirms the skin cancer is out, they will discuss the options to repair or heal the area. You must take it easy for about two weeks after surgery (no lifting over 10-15 lbs, avoid activity to get your heart rate and blood pressure up).  SITE 2 options:  5FU/calcipotriene vs Mohs  Site 3 options: 5FU/calcipotriene vs EDC

## 2024-02-28 ENCOUNTER — Telehealth: Payer: Self-pay

## 2024-02-28 NOTE — Telephone Encounter (Signed)
 Discussed pathology results with patient. Appointment scheduled for [facility] x2. Will discuss Mohs vs 5FU/Calcip. At that visit.

## 2024-02-28 NOTE — Telephone Encounter (Signed)
-----   Message from [facility] sent at 02/22/2024  6:15 PM EDT ----- Diagnosis 1. Skin, right lower anterior leg distal :       SQUAMOUS CELL CARCINOMA IN SITU, BASE INVOLVED        2. Skin, right posterior neck lateral :       SQUAMOUS CELL CARCINOMA IN SITU, BASE INVOLVED        3. Skin, right upper extensor arm :       SQUAMOUS CELL CARCINOMA IN SITU, BASE INVOLVED     Please call with diagnosis and message me with patient's decision on treatment. Please schedule FBSE 6 months   Explanation: biopsies show squamous cell skin cancers limited to the top layer of skin. This means they are early cancers and have not spread. However, they have the potential to spread beyond the skin and threaten your health, so we recommend treating them.   SITE 1 options: Treatment option 1: a cream (fluorouracil and calcipotriene) that helps your immune system clear the skin cancer. It will cause redness and irritation. Wait two weeks after the biopsy to start applying the cream. Apply the cream twice per day until the redness and irritation develop (usually occurs by day 7), then stop and allow it to heal. We will recheck the area in 2 months to ensure the cancer is gone. The cream is $45 plus shipping and will be mailed to you from a low cost compounding pharmacy.  Treatment option 2: you return for a brief appointment where I perform electrodesiccation and curettage [facility]). This involves three rounds of scraping and burning to destroy the skin cancer. It has about an 85% cure rate and leaves a round wound slightly larger than the skin cancer and leaves a round white scar. No additional pathology is done. If the skin cancer comes back, we would need to do a surgery to remove it.  Treatment option 3: Mohs surgery, which involves cutting out right around the skin cancer and then checking under the microscope on the same day to ensure the whole skin cancer is out. If there is more cancer remaining, the  surgeon will repeat the process until it is fully cleared. The cure rate is about 98-99%. It is done at another office outside of [place] ([place], [place], or [place]). Once the Mohs surgeon confirms the skin cancer is out, they will discuss the options to repair or heal the area. You must take it easy for about two weeks after surgery (no lifting over 10-15 lbs, avoid activity to get your heart rate and blood pressure up).  SITE 2 options:  5FU/calcipotriene vs Mohs  Site 3 options: 5FU/calcipotriene vs EDC

## 2024-03-07 ENCOUNTER — Other Ambulatory Visit: Payer: Self-pay | Admitting: Nurse Practitioner

## 2024-03-08 NOTE — Telephone Encounter (Signed)
 Requested Prescriptions  Pending Prescriptions Disp Refills   gabapentin (NEURONTIN) 100 MG capsule [Pharmacy Med Name: GABAPENTIN 100 MG CAP] 90 capsule 0    Sig: TAKE 1 CAPSULE BY MOUTH AT BEDTIME     Neurology: Anticonvulsants - gabapentin Passed - 03/08/2024  1:17 PM      Passed - Cr in normal range and within 360 days    Creatinine  Date Value Ref Range Status  01/31/2024 1.05 0.61 - 1.24 mg/dL Final   Creat  Date Value Ref Range Status  10/15/2017 1.14 0.70 - 1.33 mg/dL Final    Comment:    For patients >5 years of age, the reference limit for Creatinine is approximately 13% higher for people identified as African-American. Verna Czech - Completed PHQ-2 or PHQ-9 in the last 360 days      Passed - Valid encounter within last 12 months    Recent Outpatient Visits           1 month ago Annual physical exam   [place] [facility] [name], NP       Future Appointments             In 2 weeks Elie Goody, MD [facility]   In 1 month [name], NP [facility]   In 1 month McGow[place], Elana Alm [facility]

## 2024-03-28 ENCOUNTER — Ambulatory Visit (INDEPENDENT_AMBULATORY_CARE_PROVIDER_SITE_OTHER): Admitting: Dermatology

## 2024-03-28 ENCOUNTER — Encounter: Payer: Self-pay | Admitting: Dermatology

## 2024-03-28 DIAGNOSIS — L570 Actinic keratosis: Secondary | ICD-10-CM

## 2024-03-28 DIAGNOSIS — D0461 Carcinoma in situ of skin of right upper limb, including shoulder: Secondary | ICD-10-CM | POA: Diagnosis not present

## 2024-03-28 DIAGNOSIS — D044 Carcinoma in situ of skin of scalp and neck: Secondary | ICD-10-CM

## 2024-03-28 DIAGNOSIS — L578 Other skin changes due to chronic exposure to nonionizing radiation: Secondary | ICD-10-CM

## 2024-03-28 DIAGNOSIS — D099 Carcinoma in situ, unspecified: Secondary | ICD-10-CM

## 2024-03-28 DIAGNOSIS — W908XXA Exposure to other nonionizing radiation, initial encounter: Secondary | ICD-10-CM

## 2024-03-28 DIAGNOSIS — D0471 Carcinoma in situ of skin of right lower limb, including hip: Secondary | ICD-10-CM | POA: Diagnosis not present

## 2024-03-28 DIAGNOSIS — Z5111 Encounter for antineoplastic chemotherapy: Secondary | ICD-10-CM | POA: Diagnosis not present

## 2024-03-28 MED ORDER — FLUOROURACIL 5 % EX CREA: TOPICAL_CREAM | Freq: Two times a day (BID) | CUTANEOUS | 1 refills | Status: DC

## 2024-03-28 NOTE — Patient Instructions (Addendum)
 [facility]  [address] [place], [place] [address]  Phone: [phone]   Due to recent changes in healthcare laws, you may see results of your pathology and/or laboratory studies on MyChart before the doctors have had a chance to review them. We understand that in some cases there may be results that are confusing or concerning to you. Please understand that not all results are received at the same time and often the doctors may need to interpret multiple results in order to provide you with the best plan of care or course of treatment. Therefore, we ask that you please give us  2 business days to thoroughly review all your results before contacting the office for clarification. Should we see a critical lab result, you will be contacted sooner.   If You Need Anything After Your Visit  If you have any questions or concerns for your doctor, please call our main line at [phone] and press option 4 to reach your doctor's medical assistant. If no one answers, please leave a voicemail as directed and we will return your call as soon as possible. Messages left after 4 pm will be answered the following business day.   You may also send us  a message via MyChart. We typically respond to MyChart messages within 1-2 business days.  For prescription refills, please ask your pharmacy to contact our office. Our fax number is [phone].  If you have an urgent issue when the clinic is closed that cannot wait until the next business day, you can page your doctor at the number below.    Please note that while we do our best to be available for urgent issues outside of office hours, we are not available 24/7.   If you have an urgent issue and are unable to reach us , you may choose to seek medical care at your doctor's office, retail clinic, urgent care center, or emergency room.  If you have a medical emergency, please immediately call 911 or go to the emergency department.  Pager  Numbers  - Dr. Bary Likes: [phone]  - Dr. Annette Barters: [phone]  - Dr. Felipe Horton: [phone]   In the event of inclement weather, please call our main line at [phone] for an update on the status of any delays or closures.  Dermatology Medication Tips: Please keep the boxes that topical medications come in in order to help keep track of the instructions about where and how to use these. Pharmacies typically print the medication instructions only on the boxes and not directly on the medication tubes.   If your medication is too expensive, please contact our office at [phone] option 4 or send us  a message through MyChart.   We are unable to tell what your co-pay for medications will be in advance as this is different depending on your insurance coverage. However, we may be able to find a substitute medication at lower cost or fill out paperwork to get insurance to cover a needed medication.   If a prior authorization is required to get your medication covered by your insurance company, please allow us  1-2 business days to complete this process.  Drug prices often vary depending on where the prescription is filled and some pharmacies may offer cheaper prices.  The website www.goodrx.com contains coupons for medications through different pharmacies. The prices here do not account for what the cost may be with help from insurance (it may be cheaper with your insurance), but the website can give you the price if you did not  use any insurance.  - You can print the associated coupon and take it with your prescription to the pharmacy.  - You may also stop by our office during regular business hours and pick up a GoodRx coupon card.  - If you need your prescription sent electronically to a different pharmacy, notify our office through [facility] or by phone at [phone] option 4.     Si Usted Necesita Algo Despus de Su Visita  Tambin puede enviarnos un mensaje a travs de  Clinical cytogeneticist. Por lo general respondemos a los mensajes de MyChart en el transcurso de 1 a 2 das hbiles.  Para renovar recetas, por favor pida a su farmacia que se ponga en contacto con nuestra oficina. Franz Jacks de fax es [place] [phone].  Si tiene un asunto urgente cuando la clnica est cerrada y que no puede esperar hasta el siguiente da hbil, puede llamar/localizar a su doctor(a) al nmero que aparece a continuacin.   Por favor, tenga en cuenta que aunque hacemos todo lo posible para estar disponibles para asuntos urgentes fuera del horario de [place], no estamos disponibles las 24 horas del da, los 7 [address]  [address] de la [place].   Si tiene un problema urgente y no puede comunicarse con nosotros, puede optar por buscar atencin mdica  en el consultorio de su doctor(a), en una clnica privada, en un centro de atencin urgente o en una sala de emergencias.  Si tiene Engineer, drilling, por favor llame inmediatamente al 911 o vaya a la sala de emergencias.  Nmeros de bper  - Dr. Bary Likes: [phone]  - Dra. Annette Barters: [phone]  - Dr. Felipe Horton: [phone]   En caso de inclemencias del tiempo, por favor llame a Lajuan Pila principal al [phone] para una actualizacin sobre el [place] de cualquier retraso o cierre.  Consejos para la medicacin en dermatologa: Por favor, guarde las cajas en las que vienen los medicamentos de uso tpico para ayudarle a seguir las instrucciones sobre dnde y cmo usarlos. Las farmacias generalmente imprimen las instrucciones del medicamento slo en las cajas y no directamente en los tubos del [place].   Si su medicamento es muy caro, por favor, pngase en contacto con Bettyjane Brunet llamando al [phone] y presione la opcin 4 o envenos un mensaje a travs de Clinical cytogeneticist.   No podemos decirle cul ser su copago por los medicamentos por adelantado ya que esto es diferente dependiendo de la cobertura de su seguro. Sin embargo, es posible que  podamos encontrar un medicamento sustituto a Audiological scientist un formulario para que el seguro cubra el medicamento que se considera necesario.   Si se requiere una autorizacin previa para que su compaa de seguros [place] su medicamento, por favor permtanos de 1 a 2 das hbiles para completar este proceso.  Los precios de los medicamentos varan con frecuencia dependiendo del Environmental consultant de dnde se surte la receta y alguna farmacias pueden ofrecer precios ms baratos.  El sitio web www.goodrx.com tiene cupones para medicamentos de Health and safety inspector. Los precios aqu no tienen en cuenta lo que podra costar con la ayuda del seguro (puede ser ms barato con su seguro), pero el sitio web puede darle el precio si no utiliz Tourist information centre manager.  - Puede imprimir el cupn correspondiente y llevarlo con su receta a la farmacia.  - Tambin puede pasar por nuestra oficina durante el horario de atencin regular y Education officer, museum una tarjeta de cupones de GoodRx.  - Si necesita que su receta se enve  electrnicamente a Margarette Shawl diferente, informe a nuestra oficina a travs de MyChart de [place] o por telfono llamando al [phone] y presione la opcin 4.

## 2024-03-28 NOTE — Progress Notes (Signed)
 Follow-Up Visit   Subjective  Russell Simpson is a 63 y.o. male who presents for the following: Pt here today to treat bx proven SCCIS of the R low ant leg distal, R upper extensor arm and to discuss treatment options for bx proven SCCIS of the R post neck lat.    The following portions of the chart were reviewed this encounter and updated as appropriate: medications, allergies, medical history  Review of Systems:  No other skin or systemic complaints except as noted in HPI or Assessment and Plan.  Objective  Well appearing patient in no apparent distress; mood and affect are within normal limits.   A focused examination was performed of the following areas: the face, neck, and legs   Relevant exam findings are noted in the Assessment and Plan.  R low ant leg distal Healing crust. R upper extensor arm Pink biopsy site. R post neck lat Pink biopsy site. Face Erythematous thin papules/macules with gritty scale.   Assessment & Plan   SQUAMOUS CELL CARCINOMA IN SITU (3) R low ant leg distal R upper extensor arm R post neck lat Discussed 5FU vs Mohs for the R post neck lat - pt prefers 5FU/calcipotriene  cream apply to aa BID until irritation/reaction occurs. Pt will also treat R arm SCCis with 5FU calcipo. Since R lat ant leg distal has healing crust despite 1 month of healing time so do not want to create bigger wound with EDC that will take 4+ months to heal. recheck in two months and treat with 5FU/calcipotriene  mix if indicated. AK (ACTINIC KERATOSIS) Face Start 5FU/Calcipotriene  mix BID until reaction occurs then stop. Use on the forehead, temples, and cheeks. May treat one area at a time or all at once, which ever patient prefers. ACTINIC ELASTOSIS    ACTINIC DAMAGE WITH PRECANCEROUS ACTINIC KERATOSES Counseling for Topical Chemotherapy Management: Patient exhibits: - Severe, confluent actinic changes with pre-cancerous actinic keratoses that is secondary to  cumulative UV radiation exposure over time - Condition that is severe; chronic, not at goal. - diffuse scaly erythematous macules and papules with underlying dyspigmentation - Discussed Prescription "Field Treatment" topical Chemotherapy for Severe, Chronic Confluent Actinic Changes with Pre-Cancerous Actinic Keratoses Field treatment involves treatment of an entire area of skin that has confluent Actinic Changes (Sun/ Ultraviolet light damage) and PreCancerous Actinic Keratoses by method of PhotoDynamic Therapy (PDT) and/or prescription Topical Chemotherapy agents such as 5-fluorouracil , 5-fluorouracil /calcipotriene , and/or imiquimod.  The purpose is to decrease the number of clinically evident and subclinical PreCancerous lesions to prevent progression to development of skin cancer by chemically destroying early precancer changes that may or may not be visible.  It has been shown to reduce the risk of developing skin cancer in the treated area. As a result of treatment, redness, scaling, crusting, and open sores may occur during treatment course. One or more than one of these methods may be used and may have to be used several times to control, suppress and eliminate the PreCancerous changes. Discussed treatment course, expected reaction, and possible side effects. - Recommend daily broad spectrum sunscreen SPF 30+ to sun-exposed areas, reapply every 2 hours as needed.  - Staying in the shade or wearing long sleeves, sun glasses (UVA+UVB protection) and wide brim hats (4-inch brim around the entire circumference of the hat) are also recommended. - Call for new or changing lesions.   Return in about 5 months (around 08/28/2024) for TBSE - hx SCC, hx BCC; 2 mths to recheck SCCIS sites  S/P 5FU.  Arlinda Lais, CMA, am acting as scribe for Harris Liming, MD .   Documentation: I have reviewed the above documentation for accuracy and completeness, and I agree with the above.  Harris Liming,  MD

## 2024-04-06 ENCOUNTER — Encounter: Payer: Self-pay | Admitting: Emergency Medicine

## 2024-04-13 ENCOUNTER — Telehealth: Payer: Self-pay

## 2024-04-13 DIAGNOSIS — Z7189 Other specified counseling: Secondary | ICD-10-CM

## 2024-04-13 DIAGNOSIS — C8202 Follicular lymphoma grade I, intrathoracic lymph nodes: Secondary | ICD-10-CM

## 2024-04-13 NOTE — Telephone Encounter (Signed)
 Patient was never scheduled for a CT; has a follow up appointment with Dr. Randy Buttery tomorrow at [facility].  Per Dr. Randy Buttery consult with patient to see if he would like scan to be done and may need to reschedule appointment to a later time if patient indeed wants scan.  Outbound call to patient; voice message left.  When patient calls back if he does not wish to have the scan done, scheduling can cancel the appointment for tomorrow.

## 2024-04-13 NOTE — Telephone Encounter (Signed)
 Incoming call from patient; informed of below.  Patient would like to have a CT scan done and have his appointment with Dr. Randy Buttery rescheduled.  Patient also asked if his port labs /flush scheduled for tomorrow 5/15 at 2:45PM also be rescheduled the day he comes to see Dr. Randy Buttery.  Secure chat sent to scheduling team to please schedule for a CT and for  follow up appointment with Dr. Randy Buttery and port labs / flush to be scheduled the same day.  No need for appointments scheduled tomorrow 04/14/24.

## 2024-04-13 NOTE — Telephone Encounter (Signed)
 Appointments have been rescheduled as well as CT scan.

## 2024-04-14 ENCOUNTER — Inpatient Hospital Stay: Payer: Medicare HMO

## 2024-04-14 ENCOUNTER — Inpatient Hospital Stay: Payer: Medicare HMO | Admitting: Oncology

## 2024-04-17 ENCOUNTER — Telehealth: Payer: Self-pay | Admitting: Nurse Practitioner

## 2024-04-17 NOTE — Telephone Encounter (Signed)
 Copied from CRM [id number]. Topic: Medicare AWV >> Apr 17, 2024 10:25 AM Juliana Ocean wrote: Reason for CRM: LVM 04/17/24 reminder call for AWV appt 04/18/24 @3 :10pm  Rosalee Collins; Care Guide Ambulatory Clinical Support [place] [facility] Direct Dial: [phone]

## 2024-04-18 ENCOUNTER — Encounter (INDEPENDENT_AMBULATORY_CARE_PROVIDER_SITE_OTHER): Payer: Self-pay

## 2024-04-18 ENCOUNTER — Ambulatory Visit: Payer: Self-pay | Admitting: Emergency Medicine

## 2024-04-18 ENCOUNTER — Telehealth: Payer: Self-pay

## 2024-04-18 ENCOUNTER — Other Ambulatory Visit: Payer: Self-pay

## 2024-04-18 ENCOUNTER — Other Ambulatory Visit: Payer: Self-pay | Admitting: *Deleted

## 2024-04-18 VITALS — Ht 74.0 in | Wt 230.0 lb

## 2024-04-18 DIAGNOSIS — Z0000 Encounter for general adult medical examination without abnormal findings: Secondary | ICD-10-CM

## 2024-04-18 DIAGNOSIS — T7840XD Allergy, unspecified, subsequent encounter: Secondary | ICD-10-CM

## 2024-04-18 DIAGNOSIS — R768 Other specified abnormal immunological findings in serum: Secondary | ICD-10-CM

## 2024-04-18 MED ORDER — FINASTERIDE 5 MG PO TABS: 5.0000 mg | ORAL_TABLET | Freq: Every day | ORAL | 1 refills | Status: DC

## 2024-04-18 MED ORDER — VEMLIDY 25 MG PO TABS: 25.0000 mg | ORAL_TABLET | Freq: Every day | ORAL | 1 refills | Status: DC

## 2024-04-18 NOTE — Telephone Encounter (Signed)
 He is not on active rx. If he needs a refill he should reach out to his pcp

## 2024-04-18 NOTE — Patient Instructions (Signed)
 [name] , Thank you for taking time out of your busy schedule to complete your Annual Wellness Visit with me. I enjoyed our conversation and look forward to speaking with you again next year. I, as well as your care team,  appreciate your ongoing commitment to your health goals. Please review the following plan we discussed and let me know if I can assist you in the future. Your Game plan/ To Do List    Referrals: None   Follow up Visits: Next Medicare AWV with our clinical staff: 04/24/25 @ 3:10pm (phone visit)   Have you seen your provider in the last 6 months (3 months if uncontrolled diabetes)? Yes Next Office Visit with your provider: 04/27/24 @ 3:40pm with Aileen Alexanders, NP  Clinician Recommendations:  Aim for 30 minutes of exercise or brisk walking, 6-8 glasses of water , and 5 servings of fruits and vegetables each day. Recommend getting a routine eye exam. Call Dr. Lennart Quitter office to schedule. You are due to have a repeat colonoscopy 06/08/24. Please call [facility] @ [phone] to schedule at your earliest convenience.      This is a list of the screening recommended for you and due dates:  Health Maintenance  Topic Date Due   Zoster (Shingles) Vaccine (1 of 2) Never done   COVID-19 Vaccine (2 - Pfizer risk series) 03/20/2021   Flu Shot  06/30/2024   Colon Cancer Screening  12/09/2024   Medicare Annual Wellness Visit  04/18/2025   Pneumococcal Vaccination  Completed   Hepatitis C Screening  Completed   HIV Screening  Completed   HPV Vaccine  Aged Out   Meningitis B Vaccine  Aged Out   DTaP/Tdap/Td vaccine  Discontinued   Cologuard (Stool DNA test)  Discontinued    Advanced directives: (In Chart) A copy of your advanced directives are scanned into your chart should your provider ever need it. Advance Care Planning is important because it:  [x]  Makes sure you receive the medical care that is consistent with your values, goals, and preferences  [x]  It provides  guidance to your family and loved ones and reduces their decisional burden about whether or not they are making the right decisions based on your wishes.  Follow the link provided in your after visit summary or read over the paperwork we have mailed to you to help you started getting your Advance Directives in place. If you need assistance in completing these, please reach out to us  so that we can help you!  See attachments for Preventive Care and Fall Prevention Tips.   Fall Prevention in the Home, Adult Falls can cause injuries and affect people of all ages. There are many simple things that you can do to make your home safe and to help prevent falls. If you need it, ask for help making these changes. What actions can I take to prevent falls? General information Use good lighting in all rooms. Make sure to: Replace any light bulbs that burn out. Turn on lights if it is dark and use night-lights. Keep items that you use often in easy-to-reach places. Lower the shelves around your home if needed. Move furniture so that there are clear paths around it. Do not keep throw rugs or other things on the floor that can make you trip. If any of your floors are uneven, fix them. Add color or contrast paint or tape to clearly mark and help you see: Grab bars or handrails. First and last steps of staircases. Where  the edge of each step is. If you use a ladder or stepladder: Make sure that it is fully opened. Do not climb a closed ladder. Make sure the sides of the ladder are locked in place. Have someone hold the ladder while you use it. Know where your pets are as you move through your home. What can I do in the bathroom?     Keep the floor dry. Clean up any water  that is on the floor right away. Remove soap buildup in the bathtub or shower. Buildup makes bathtubs and showers slippery. Use non-skid mats or decals on the floor of the bathtub or shower. Attach bath mats securely with  double-sided, non-slip rug tape. If you need to sit down while you are in the shower, use a non-slip stool. Install grab bars by the toilet and in the bathtub and shower. Do not use towel bars as grab bars. What can I do in the bedroom? Make sure that you have a light by your bed that is easy to reach. Do not use any sheets or blankets on your bed that hang to the floor. Have a firm bench or chair with side arms that you can use for support when you get dressed. What can I do in the kitchen? Clean up any spills right away. If you need to reach something above you, use a sturdy step stool that has a grab bar. Keep electrical cables out of the way. Do not use floor polish or wax that makes floors slippery. What can I do with my stairs? Do not leave anything on the stairs. Make sure that you have a light switch at the top and the bottom of the stairs. Have them installed if you do not have them. Make sure that there are handrails on both sides of the stairs. Fix handrails that are broken or loose. Make sure that handrails are as long as the staircases. Install non-slip stair treads on all stairs in your home if they do not have carpet. Avoid having throw rugs at the top or bottom of stairs, or secure the rugs with carpet tape to prevent them from moving. Choose a carpet design that does not hide the edge of steps on the stairs. Make sure that carpet is firmly attached to the stairs. Fix any carpet that is loose or worn. What can I do on the outside of my home? Use bright outdoor lighting. Repair the edges of walkways and driveways and fix any cracks. Clear paths of anything that can make you trip, such as tools or rocks. Add color or contrast paint or tape to clearly mark and help you see high doorway thresholds. Trim any bushes or trees on the main path into your home. Check that handrails are securely fastened and in good repair. Both sides of all steps should have handrails. Install  guardrails along the edges of any raised decks or porches. Have leaves, snow, and ice cleared regularly. Use sand, salt, or ice melt on walkways during winter months if you live where there is ice and snow. In the garage, clean up any spills right away, including grease or oil spills. What other actions can I take? Review your medicines with your health care provider. Some medicines can make you confused or feel dizzy. This can increase your chance of falling. Wear closed-toe shoes that fit well and support your feet. Wear shoes that have rubber soles and low heels. Use a cane, walker, scooter, or crutches that help you  move around if needed. Talk with your provider about other ways that you can decrease your risk of falls. This may include seeing a physical therapist to learn to do exercises to improve movement and strength. Where to find more information Centers for Disease Control and Prevention, [employer]: TonerPromos.no [employer] on Aging: BaseRingTones.pl National Institute on Aging: BaseRingTones.pl Contact a health care provider if: You are afraid of falling at home. You feel weak, drowsy, or dizzy at home. You fall at home. Get help right away if you: Lose consciousness or have trouble moving after a fall. Have a fall that causes a head injury. These symptoms may be an emergency. Get help right away. Call 911. Do not wait to see if the symptoms will go away. Do not drive yourself to the hospital. This information is not intended to replace advice given to you by your health care provider. Make sure you discuss any questions you have with your health care provider. Document Revised: 07/20/2022 Document Reviewed: 07/20/2022 Elsevier Patient Education  2024 [employer].

## 2024-04-18 NOTE — Progress Notes (Signed)
 Subjective:   Attilio Zeitler is a 63 y.o. who presents for a Medicare Wellness preventive visit.  As a reminder, Annual Wellness Visits don't include a physical exam, and some assessments may be limited, especially if this visit is performed virtually. We may recommend an in-person follow-up visit with your provider if needed.  Visit Complete: Virtual I connected with  Guyann Leitz on 04/18/24 by a audio enabled telemedicine application and verified that I am speaking with the correct person using two identifiers.  Patient Location: Other:  Gym  Provider Location: Home Office  I discussed the limitations of evaluation and management by telemedicine. The patient expressed understanding and agreed to proceed.  Vital Signs: Because this visit was a virtual/telehealth visit, some criteria may be missing or patient reported. Any vitals not documented were not able to be obtained and vitals that have been documented are patient reported.  VideoDeclined- This patient declined Librarian, academic. Therefore the visit was completed with audio only.  Persons Participating in Visit: Patient.  AWV Questionnaire: No: Patient Medicare AWV questionnaire was not completed prior to this visit.  Cardiac Risk Factors include: advanced age (>60men, >75 women);hypertension;dyslipidemia     Objective:     Today's Vitals   04/18/24 1511  Weight: 230 lb (104.3 kg)  Height: 6\' 2"  (1.88 m)  PainSc: 10-Worst pain ever   Body mass index is 29.53 kg/m.     04/18/2024    3:29 PM 01/17/2024    2:25 PM 01/08/2024    1:10 PM 01/07/2024    1:55 PM 01/04/2024    8:55 AM 01/03/2024    8:34 AM 12/28/2023    8:23 AM  Advanced Directives  Does Patient Have a Medical Advance Directive? Yes Yes Yes Yes Yes Yes Yes  Type of Estate agent of [place];Living will Healthcare Power of [place];Living will Healthcare Power of [employer] of  [place];Living will Healthcare Power of [place];Living will Healthcare Power of [place];Living will  Does patient want to make changes to medical advance directive? No - Patient declined Yes (ED - Information included in AVS) No - Patient declined  No - Patient declined No - Patient declined No - Patient declined  Copy of Healthcare Power of Attorney in Chart? Yes - validated most recent copy scanned in chart (See row information) No - copy requested Yes - validated most recent copy scanned in chart (See row information)  No - copy requested No - copy requested No - copy requested  Would patient like information on creating a medical advance directive?   No - Patient declined  No - Patient declined No - Patient declined     Current Medications (verified) Outpatient Encounter Medications as of 04/18/2024  Medication Sig   albuterol  (VENTOLIN  HFA) 108 (90 Base) MCG/ACT inhaler Inhale 2 puffs into the lungs every 6 (six) hours as needed for wheezing or shortness of breath.   atorvastatin  (LIPITOR ) 80 MG tablet Take 1 tablet (80 mg total) by mouth daily.   buPROPion  (WELLBUTRIN  XL) 300 MG 24 hr tablet Take 300 mg by mouth daily.   cetirizine  (ZYRTEC ) 10 MG tablet Take 1 tablet (10 mg total) by mouth daily.   clonazePAM  (KLONOPIN ) 0.5 MG tablet Take 0.5 mg by mouth every evening.   cyclobenzaprine  (FLEXERIL ) 10 MG tablet Take 10 mg by mouth 3 (three) times daily.   DULoxetine  (CYMBALTA ) 60 MG capsule Take 60 mg by mouth every evening.   finasteride  (PROSCAR ) 5  MG tablet Take 1 tablet (5 mg total) by mouth daily.   fluorouracil  (EFUDEX ) 5 % cream Apply topically 2 (two) times daily. Apply to aa's R arm, R lower leg, and R neck BID until reaction occurs.   fluticasone  (FLONASE ) 50 MCG/ACT nasal spray Place 2 sprays into both nostrils daily.   gabapentin  (NEURONTIN ) 100 MG capsule TAKE 1 CAPSULE BY MOUTH AT BEDTIME   Ivermectin 6 MG TABS Take 1 tablet by mouth daily.   metoprolol  succinate (TOPROL -XL)  100 MG 24 hr tablet TAKE 1 TABLET BY MOUTH ONCE DAILY WITH OR IMMEDIATELY FOLLOWING A MEAL   mirtazapine  (REMERON ) 15 MG tablet Take 15 mg by mouth at bedtime.   naltrexone  (DEPADE) 50 MG tablet Take 50 mg by mouth daily.   ondansetron  (ZOFRAN ) 4 MG tablet Take 1 tablet (4 mg total) by mouth every 8 (eight) hours as needed for nausea or vomiting.   sildenafil  (VIAGRA ) 100 MG tablet Take 1 tablet (100 mg total) by mouth daily as needed for erectile dysfunction. Take two hours prior to intercourse on an empty stomach   testosterone  cypionate (DEPOTESTOSTERONE CYPIONATE) 200 MG/ML injection Inject 1 mL (200 mg total) into the muscle every 14 (fourteen) days. INJECT 1ML INTRAMUSCULARLY EVERY 14 DAYS   diphenhydrAMINE  (BENADRYL ) 25 mg capsule Starting 2 days prior to your chemotherapy infusions, take 1 pill (25 mg) by mouth daily. Do not take on day of chemotherapy. (Patient not taking: Reported on 04/18/2024)   famotidine  (PEPCID ) 20 MG tablet Starting 2 days prior to your chemotherapy infusions, take 1 pill (20 mg) by mouth daily. Do not take on day of chemotherapy. (Patient not taking: Reported on 04/18/2024)   lenalidomide  (REVLIMID ) 15 MG capsule TAKE 1 CAPSULE BY MOUTH DAILY FOR 21 DAYS, THEN HOLD FOR 7 DAYS. REPEAT EVERY 28 DAYS (Patient not taking: Reported on 04/18/2024)   lenalidomide  (REVLIMID ) 15 MG capsule Take 1 capsule (15 mg total) by mouth daily. Take for 21 days, then hold for 7 days. Repeat every 28 days. (Patient not taking: Reported on 04/18/2024)   montelukast  (SINGULAIR ) 10 MG tablet TAKE 1 TABLET EVERY DAY (Patient not taking: Reported on 04/18/2024)   tenofovir  alafenamide (VEMLIDY ) 25 MG tablet Take 1 tablet (25 mg total) by mouth daily. (Patient not taking: Reported on 04/18/2024)   Facility-Administered Encounter Medications as of 04/18/2024  Medication   heparin  lock flush 100 unit/mL   heparin  lock flush 100 unit/mL   sodium chloride  flush (NS) 0.9 % injection 10 mL    Allergies  (verified) Penicillins; Tetanus toxoid; Tetanus toxoid, adsorbed; Tetanus toxoids; Lisinopril; Losartan; Codeine; Doxycycline ; and Ruxience  [rituximab -pvvr]   History: 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HCC)    Arthritis    Asthma    Basal cell carcinoma 01/28/2021   R upper arm, EDC 03/04/2021   Basal cell carcinoma 12/31/2022   Right temporal hair line. Nodular. 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   right  upper back ED&C done   Squamous cell carcinoma of skin 02/19/2021   L inferior mandible, treated with EDC   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/28/2021   R ant shoulder - ED&C   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/28/2021   L upper abdomen - ED&C   Past Surgical History:  Procedure Laterality Date   ANTERIOR CERVICAL DECOMP/DISCECTOMY FUSION N/A 06/17/2016   Procedure: ANTERIOR CERVICAL DECOMPRESSION FUSION, CERVICAL 3-4, CERVICAL 4-5 WITH INSTRUMENTATION AND ALLOGRAFT;  Surgeon: Virl Grimes, MD;  Location: [facility] OR;  Service: Orthopedics;  Laterality: N/A;  ANTERIOR CERVICAL DECOMPRESSION FUSION, CERVICAL 3-4, CERVICAL 4-5 WITH INSTRUMENTATION AND ALLOGRAFT   BACK SURGERY     x3   COLONOSCOPY WITH PROPOFOL  N/A 12/10/2023   Procedure: COLONOSCOPY WITH PROPOFOL ;  Surgeon: Selena Daily, MD;  Location: [facility] ENDOSCOPY;  Service: Gastroenterology;  Laterality: N/A;   HARDWARE REMOVAL Right 05/04/2023   Procedure: HARDWARE REMOVAL;  Surgeon: Rande Bushy, MD;  Location: [facility] ORS;  Service: Orthopedics;  Laterality: Right;   HEMOSTASIS CLIP PLACEMENT  12/10/2023   Procedure: HEMOSTASIS CLIP PLACEMENT;  Surgeon: Selena Daily, MD;  Location: [facility] ENDOSCOPY;  Service: Gastroenterology;;   HEMOSTASIS CONTROL  12/10/2023   Procedure: HEMOSTASIS CONTROL;  Surgeon: Selena Daily, MD;  Location: [facility] ENDOSCOPY;  Service: Gastroenterology;;   NECK SURGERY  12/01/2007   ORIF ANKLE FRACTURE Right 12/02/2022   Procedure: OPEN REDUCTION INTERNAL FIXATION (ORIF) ANKLE FRACTURE;  Surgeon: Rande Bushy, MD;  Location: [facility] ORS;  Service: Orthopedics;  Laterality: Right;   ORIF ANKLE FRACTURE Right 12/01/2022   Procedure: OPEN REDUCTION INTERNAL FIXATION (ORIF) ANKLE FRACTURE;  Surgeon: Rande Bushy, MD;  Location: [facility] ORS;  Service: Orthopedics;  Laterality: Right;   POLYPECTOMY  12/10/2023   Procedure: POLYPECTOMY;  Surgeon: Selena Daily, MD;  Location: [facility] ENDOSCOPY;  Service:  Gastroenterology;;   PORTA CATH INSERTION N/A 04/03/2021   Procedure: PORTA CATH INSERTION;  Surgeon: Celso College, MD;  Location: [facility] INVASIVE CV LAB;  Service: Cardiovascular;  Laterality: N/A;   SUBMUCOSAL LIFTING INJECTION  12/10/2023   Procedure: SUBMUCOSAL LIFTING INJECTION;  Surgeon: Selena Daily, MD;  Location: [facility] ENDOSCOPY;  Service: Gastroenterology;;   SUBMUCOSAL TATTOO INJECTION  12/10/2023   Procedure: SUBMUCOSAL TATTOO INJECTION;  Surgeon: Selena Daily, MD;  Location: [facility] ENDOSCOPY;  Service: Gastroenterology;;   VIDEO BRONCHOSCOPY  WITH ENDOBRONCHIAL ULTRASOUND Bilateral 10/14/2022   Procedure: VIDEO BRONCHOSCOPY WITH ENDOBRONCHIAL ULTRASOUND;  Surgeon: Marc Senior, MD;  Location: [facility] ORS;  Service: Pulmonary;  Laterality: Bilateral;   Family History  Problem Relation Age of Onset   Diabetes Mother    Heart attack Father    Emphysema Father    Diabetes Other    Social History   Socioeconomic History   Marital status: Divorced    Spouse name: Not on file   Number of children: 1   Years of education: Not on file   Highest education level: Not on file  Occupational History   Occupation: disability  Tobacco Use   Smoking status: Former    Types: Cigars    Quit date: 11/30/2022    Years since quitting: 1.3    Passive exposure: Past   Smokeless tobacco: Never  Vaping Use   Vaping status: Former   Start date: 04/30/2017   Quit date: 11/30/2017  Substance and Sexual Activity   Alcohol use: Yes    Alcohol/week: 42.0 standard drinks of alcohol    Types: 42 Cans of beer per week    Comment: beers 6 a day   Drug use: No   Sexual activity: Not Currently  Other Topics Concern   Not on file  Social History Narrative   Not on file   Social Drivers of Health   Financial Resource Strain: Low Risk  (04/18/2024)   Overall Financial Resource Strain (CARDIA)    Difficulty of Paying Living Expenses: Not very hard  Food Insecurity: No Food Insecurity  (04/18/2024)   Hunger Vital Sign    Worried About Running Out of Food in the Last Year: Never true    Ran Out of Food in the Last Year: Never true  Transportation Needs: No Transportation Needs (04/18/2024)   PRAPARE - Administrator, Civil Service (Medical): No    Lack of Transportation (Non-Medical): No  Physical Activity: Insufficiently Active (04/18/2024)   Exercise Vital Sign    Days of Exercise per Week: 2 days    Minutes of Exercise per Session: 40 min  Stress: No Stress Concern Present (04/18/2024)   [name] of Occupational Health - Occupational Stress Questionnaire    Feeling of Stress : Not at all  Social Connections: Socially Isolated (04/18/2024)   Social Connection and Isolation Panel [NHANES]    Frequency of Communication with Friends and Family: More than three times a week    Frequency of Social Gatherings with Friends and Family: Twice a week    Attends Religious Services: Never    Database administrator or Organizations: No    Attends Engineer, structural: Never    Marital Status: Divorced    Tobacco Counseling Counseling given: Not Answered    Clinical Intake:  Pre-visit preparation completed: Yes  Pain : 0-10 Pain Score: 10-Worst pain ever Pain Type: Chronic pain Pain Location: Back Pain Descriptors / Indicators: Aching     BMI - recorded: 29.53 Nutritional Status: BMI 25 -29 Overweight Nutritional Risks: None Diabetes: No  Lab Results  Component Value Date   HGBA1C 6.0 (H) 01/28/2024   HGBA1C 5.6 07/19/2023   HGBA1C 6.2 (H) 12/30/2022     How often do you need to have someone help you when you read instructions, pamphlets, or other written materials from your doctor or pharmacy?: 1 - Never  Interpreter Needed?: No  Information entered by :: Jaunita Messier, CMA   Activities of Daily Living  04/18/2024    3:14 PM 01/08/2024    1:10 PM  In your present state of health, do you have any difficulty performing the  following activities:  Hearing? 0 0  Vision? 0 0  Difficulty concentrating or making decisions? 0 0  Walking or climbing stairs? 1   Comment uses cane prn   Dressing or bathing? 0   Doing errands, shopping? 0   Preparing Food and eating ? N   Using the Toilet? N   In the past six months, have you accidently leaked urine? N   Do you have problems with loss of bowel control? N   Managing your Medications? N   Managing your Finances? N   Housekeeping or managing your Housekeeping? N     Patient Care Team: Aileen Alexanders, NP as PCP - General (Nurse Practitioner) Avonne Boettcher, MD as Consulting Physician (Oncology) Marc Senior, MD as Consulting Physician (Pulmonary Disease) Harris Liming, MD as Referring Physician (Dermatology) Shayne Demark, MD as Referring Physician Oda Bence PA-C as Physician Assistant (Urology) Selena Daily, MD as Consulting Physician (Gastroenterology)  Indicate any recent Medical Services you may have received from other than [facility] providers in the past year (date may be approximate).     Assessment:    This is a routine wellness examination for Sakari.  Hearing/Vision screen Hearing Screening - Comments:: Denies hearing loss Vision Screening - Comments:: Needs eye exam, Patient will call Dr. Alto Atta   Goals Addressed             This Visit's Progress    Patient Stated       "For my back to quit hurting"       Depression Screen     04/18/2024    3:26 PM 01/28/2024   10:44 AM 11/02/2023    2:02 PM 10/14/2023   10:26 AM 07/19/2023    1:34 PM 06/10/2023    4:12 PM 04/05/2023    3:35 PM  PHQ 2/9 Scores  PHQ - 2 Score 0 4 4 4 2 2 2   PHQ- 9 Score 0 5 13 12 4 6 5     Fall Risk     04/18/2024    3:31 PM 01/28/2024   10:44 AM 07/19/2023    1:33 PM 06/10/2023    4:12 PM 04/05/2023    3:38 PM  Fall Risk   Falls in the past year? 0 0 0 1 1  Number falls in past yr: 0 0 0 0 0  Injury with Fall? 0 0 0 1 1  Risk  for fall due to : No Fall Risks No Fall Risks No Fall Risks  History of fall(s);Impaired balance/gait;Orthopedic patient  Follow up Falls evaluation completed Falls evaluation completed Falls evaluation completed  Education provided;Falls prevention discussed;Falls evaluation completed    MEDICARE RISK AT HOME:  Medicare Risk at Home Any stairs in or around the home?: Yes If so, are there any without handrails?: Yes Home free of loose throw rugs in walkways, pet beds, electrical cords, etc?: No Adequate lighting in your home to reduce risk of falls?: Yes Life alert?: No Use of a cane, walker or w/c?: Yes (uses cane prn) Grab bars in the bathroom?: Yes Shower chair or bench in shower?: No Elevated toilet seat or a handicapped toilet?: No  TIMED UP AND GO:  Was the test performed?  No  Cognitive Function: 6CIT completed        04/18/2024    3:32 PM  04/05/2023    3:42 PM  6CIT Screen  What Year? 0 points 0 points  What month? 0 points 0 points  What time? 0 points 0 points  Count back from 20 0 points 2 points  Months in reverse 0 points 0 points  Repeat phrase 0 points 0 points  Total Score 0 points 2 points    Immunizations Immunization History  Administered Date(s) Administered   Influenza Split 08/05/2010, 09/30/2011   Influenza,inj,Quad PF,6+ Mos 09/25/2015, 08/31/2018, 08/21/2019, 08/30/2019, 09/12/2020, 10/20/2022   PFIZER Comirnaty(Gray Top)Covid-19 Tri-Sucrose Vaccine 02/27/2021   PNEUMOCOCCAL CONJUGATE-20 01/28/2024   Tdap 05/29/2009    Screening Tests Health Maintenance  Topic Date Due   Zoster Vaccines- Shingrix  (1 of 2) Never done   COVID-19 Vaccine (2 - Pfizer risk series) 03/20/2021   INFLUENZA VACCINE  06/30/2024   Colonoscopy  12/09/2024   Medicare Annual Wellness (AWV)  04/18/2025   Pneumococcal Vaccine 53-9 Years old  Completed   Hepatitis C Screening  Completed   HIV Screening  Completed   HPV VACCINES  Aged Out   Meningococcal B Vaccine  Aged  Out   DTaP/Tdap/Td  Discontinued   Fecal DNA (Cologuard)  Discontinued    Health Maintenance  Health Maintenance Due  Topic Date Due   Zoster Vaccines- Shingrix  (1 of 2) Never done   COVID-19 Vaccine (2 - Pfizer risk series) 03/20/2021   Health Maintenance Items Addressed: See Nurse Notes  Additional Screening:  Vision Screening: Recommended annual ophthalmology exams for early detection of glaucoma and other disorders of the eye.  Dental Screening: Recommended annual dental exams for proper oral hygiene  Community Resource Referral / Chronic Care Management: CRR required this visit?  No   CCM required this visit?  No   Plan:    I have personally reviewed and noted the following in the patient's chart:   Medical and social history Use of alcohol, tobacco or illicit drugs  Current medications and supplements including opioid prescriptions. Patient is not currently taking opioid prescriptions. Functional ability and status Nutritional status Physical activity Advanced directives List of other physicians Hospitalizations, surgeries, and ER visits in previous 12 months Vitals Screenings to include cognitive, depression, and falls Referrals and appointments  In addition, I have reviewed and discussed with patient certain preventive protocols, quality metrics, and best practice recommendations. A written personalized care plan for preventive services as well as general preventive health recommendations were provided to patient.   Jaunita Messier, CMA   04/18/2024   After Visit Summary: (MyChart) Due to this being a telephonic visit, the after visit summary with patients personalized plan was offered to patient via MyChart   Notes: Please refer to Routing Comments.

## 2024-04-18 NOTE — Telephone Encounter (Signed)
 Famotidine  script cnl. Pharmacy notified-not needed

## 2024-04-19 ENCOUNTER — Telehealth: Payer: Self-pay

## 2024-04-19 NOTE — Telephone Encounter (Signed)
 Note entered in error. Please disregard.

## 2024-04-20 ENCOUNTER — Ambulatory Visit
Admission: RE | Admit: 2024-04-20 | Discharge: 2024-04-20 | Disposition: A | Discharge status: Home / Self Care | Source: Ambulatory Visit | Attending: Oncology | Admitting: Oncology

## 2024-04-20 DIAGNOSIS — C8202 Follicular lymphoma grade I, intrathoracic lymph nodes: Secondary | ICD-10-CM | POA: Diagnosis present

## 2024-04-20 DIAGNOSIS — Z7189 Other specified counseling: Secondary | ICD-10-CM | POA: Insufficient documentation

## 2024-04-20 MED ORDER — IOHEXOL 300 MG/ML  SOLN
100.0000 mL | Freq: Once | INTRAMUSCULAR | Status: AC | PRN
  Administered 2024-04-20: 100 mL via INTRAVENOUS

## 2024-04-25 ENCOUNTER — Inpatient Hospital Stay: Attending: Oncology | Admitting: Oncology

## 2024-04-25 ENCOUNTER — Inpatient Hospital Stay

## 2024-04-25 ENCOUNTER — Encounter: Payer: Self-pay | Admitting: Oncology

## 2024-04-25 DIAGNOSIS — Z7189 Other specified counseling: Secondary | ICD-10-CM | POA: Diagnosis not present

## 2024-04-25 DIAGNOSIS — C8202 Follicular lymphoma grade I, intrathoracic lymph nodes: Secondary | ICD-10-CM | POA: Diagnosis present

## 2024-04-25 LAB — CBC WITH DIFFERENTIAL (CANCER CENTER ONLY)
Abs Immature Granulocytes: 0.25 10*3/uL — ABNORMAL HIGH (ref 0.00–0.07)
Basophils Absolute: 0.1 10*3/uL (ref 0.0–0.1)
Basophils Relative: 1 %
Eosinophils Absolute: 0.2 10*3/uL (ref 0.0–0.5)
Eosinophils Relative: 3 %
HCT: 37.4 % — ABNORMAL LOW (ref 39.0–52.0)
Hemoglobin: 12.3 g/dL — ABNORMAL LOW (ref 13.0–17.0)
Immature Granulocytes: 3 %
Lymphocytes Relative: 42 %
Lymphs Abs: 3.4 10*3/uL (ref 0.7–4.0)
MCH: 29.9 pg (ref 26.0–34.0)
MCHC: 32.9 g/dL (ref 30.0–36.0)
MCV: 90.8 fL (ref 80.0–100.0)
Monocytes Absolute: 1.2 10*3/uL — ABNORMAL HIGH (ref 0.1–1.0)
Monocytes Relative: 14 %
Neutro Abs: 3.1 10*3/uL (ref 1.7–7.7)
Neutrophils Relative %: 37 %
Platelet Count: 103 10*3/uL — ABNORMAL LOW (ref 150–400)
RBC: 4.12 MIL/uL — ABNORMAL LOW (ref 4.22–5.81)
RDW: 13.4 % (ref 11.5–15.5)
WBC Count: 8.2 10*3/uL (ref 4.0–10.5)
nRBC: 0 % (ref 0.0–0.2)

## 2024-04-25 LAB — CMP (CANCER CENTER ONLY)
ALT: 13 U/L (ref 0–44)
AST: 20 U/L (ref 15–41)
Albumin: 3.6 g/dL (ref 3.5–5.0)
Alkaline Phosphatase: 126 U/L (ref 38–126)
Anion gap: 10 (ref 5–15)
BUN: 14 mg/dL (ref 8–23)
CO2: 23 mmol/L (ref 22–32)
Calcium: 9.9 mg/dL (ref 8.9–10.3)
Chloride: 98 mmol/L (ref 98–111)
Creatinine: 1.26 mg/dL — ABNORMAL HIGH (ref 0.61–1.24)
GFR, Estimated: >60 mL/min (ref >60)
Glucose, Bld: 79 mg/dL (ref 70–99)
Potassium: 4.3 mmol/L (ref 3.5–5.1)
Sodium: 131 mmol/L — ABNORMAL LOW (ref 135–145)
Total Bilirubin: 0.9 mg/dL (ref 0.0–1.2)
Total Protein: 5.6 g/dL — ABNORMAL LOW (ref 6.5–8.1)

## 2024-04-25 LAB — LACTATE DEHYDROGENASE: LDH: 98 U/L (ref 98–192)

## 2024-04-26 NOTE — Progress Notes (Signed)
 Hematology/Oncology Consult note [facility]  [phone] [phone]  Patient Care Team: Aileen Alexanders, NP as PCP - General (Nurse Practitioner) Avonne Boettcher, MD as Consulting Physician (Oncology) Marc Senior, MD as Consulting Physician (Pulmonary Disease) Harris Liming, MD as Referring Physician (Dermatology) Shayne Demark, MD as Referring Physician Oda Bence PA-C as Physician Assistant (Urology) Selena Daily, MD as Consulting Physician (Gastroenterology)   Name of the patient: Russell Simpson  [id number]  [birth year]   Date of visit: 04/26/24  Diagnosis- visit recurrent follicular lymphoma  Chief complaint/ Reason for visit-routine follow-up of follicular lymphoma and discuss CT scan results and further management  Heme/Onc history: patient is a 63 year old male with a past medical history significant for hypertension, hyperlipidemia, hypogonadism, pituitary adenoma who recently had a fall on in December 2020. Russell Simpson  He sustained a left anterior frontal sinus as well as supraorbital rim fracture on 11/21/2019 which was managed conservatively.  He then presented to the ER at [facility] with symptoms of right-sided chest wall pain this led to a CT PE which did not show any evidence of pulmonary embolism.  He was noted to have bilateral symmetric axillary adenopathy measuring up to 1.8 cm.  Scattered mediastinal adenopathy.  Right paratracheal node measuring up to 1.2 cm.  Prominent right costophrenic lymph node measuring up to 1 cm.  Findings are nonspecific but could be seen with lymphoma versus systemic inflammatory disease such as sarcoidosis.  Of note patient has had a prior CT scan for lung screening back in 2015 when he was not noted to have this adenopathy.    Repeat CT chest in August 2021 showed persistent mild nonbulky axillary and mediastinal adenopathy which had not changed significantly as compared to his  prior CT in December 2020.  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   Repeat CT in March 2022 showed Progression and multistation adenopathy as well as significant splenomegaly measuring 20.4 cm as compared to 14.5 cm on CT scan September 2021.  CT scan showed multistation adenopathy with SUVs ranging between 6-8.9.  Patient underwent another core biopsy of right inguinal lymph node which was consistent with follicular lymphoma grade 1-2.  Bone marrow biopsy also showed moderate involvement with non-Hodgkin's B-cell lymphoma.  Baseline hepatitis B testing showed core antibody positivity.  Seen by GI and started on tenofovir    Patient had symptoms of nausea and sweating during cycle 1 of Rituxan  which resolved with additional premedications.  He developed symptoms of itching over neck chest back and bilateral eyes with second dose of Rituxan  early and had to get more premedications and was not rechallenged on the same day.  Patient subsequently received cycle 3 of Bendamustine  Rituxan  chemotherapy after Rituxan  was given as a split dose over 3 days   Scans after 6 cycles of Bendamustine  Rituxan  chemotherapy in October 2022 showed significant improvement with therapy.  Lymphadenopathy in his neck and mediastinum resolved Deauville 2.  Decrease in the splenic size and hypermetabolism.  No new lesions.  FDG accumulation within the skeleton presumably due to stimulatory effects of treatment.   Scans in April 2023 showed pulmonary nodules requiring further assessment and patient was seen by pulmonary.  He also had a PET CT scan in October 2023 which showed gradual enlargement and hypermetabolic some noted in the lung nodules.  Patient also has intra-abdominal lymph nodes ranging from 9 to 1.2 cm with an  SUV less than 3.  Patient had right lower lobe lung biopsy which was negative for malignancy.   Due to continued increase in the size of his lymph  nodes as well as splenomegaly with a spleen size of 20 cm plan was to offer second line treatment with Rituxan  and Revlimid .  Patient had significant skin rash with Revlimid  and subsequently decided to discontinue treatment altogether in February 2025    Interval history-patient reports ongoing fatigue.  He is struggling with problems with peripheral neuropathy.  Also states that he has trouble with his sexual life and does not get consistent protection.  Appetite and weight have remained stable  ECOG PS- 1 Pain scale- 3 Opioid associated constipation- no  Review of systems- Review of Systems  Constitutional:  Positive for malaise/fatigue.  Neurological:  Positive for sensory change (Peripheral neuropathy).      Allergies  Allergen Reactions   Penicillins Anaphylaxis    Tolerates cefdinir , cephalexin and amoxicillin/clavulonate so true PCN allergy unlikely   Tetanus Toxoid Swelling   Tetanus Toxoid, Adsorbed Swelling   Tetanus Toxoids Swelling   Lisinopril Cough   Losartan      Other reaction(s): Muscle Pain     Codeine Nausea Only and Nausea And Vomiting   Doxycycline  Rash   Ruxience  [Rituximab -Pvvr] Nausea And Vomiting, Rash and Other (See Comments)    05/06/21 pt w/ worsening rash during Ruxience  infusion.  Face, neck and chest flushing redness  12/27/2023: flushing, diaphoresis, nausea and vomiting     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HCC)    Arthritis    Asthma    Basal cell carcinoma 01/28/2021   R upper arm, EDC 03/04/2021   Basal cell carcinoma 12/31/2022   Right temporal hair line. Nodular. 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  right upper back ED&C done   Squamous cell carcinoma of skin 02/19/2021   L inferior mandible, treated with EDC   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/28/2021   R ant shoulder - ED&C   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/28/2021   L upper abdomen - ED&C     Past Surgical History:  Procedure Laterality Date   ANTERIOR CERVICAL DECOMP/DISCECTOMY FUSION N/A 06/17/2016   Procedure: ANTERIOR CERVICAL DECOMPRESSION FUSION, CERVICAL 3-4, CERVICAL 4-5 WITH INSTRUMENTATION AND ALLOGRAFT;  Surgeon: Virl Grimes, MD;  Location: [facility] OR;  Service: Orthopedics;  Laterality: N/A;  ANTERIOR CERVICAL DECOMPRESSION FUSION, CERVICAL 3-4, CERVICAL 4-5 WITH INSTRUMENTATION AND ALLOGRAFT   BACK SURGERY     x3   COLONOSCOPY WITH PROPOFOL  N/A 12/10/2023    Procedure: COLONOSCOPY WITH PROPOFOL ;  Surgeon: Selena Daily, MD;  Location: [facility] ENDOSCOPY;  Service: Gastroenterology;  Laterality: N/A;   HARDWARE REMOVAL Right 05/04/2023   Procedure: HARDWARE REMOVAL;  Surgeon: Rande Bushy, MD;  Location: [facility] ORS;  Service: Orthopedics;  Laterality: Right;   HEMOSTASIS CLIP PLACEMENT  12/10/2023   Procedure: HEMOSTASIS CLIP PLACEMENT;  Surgeon: Selena Daily, MD;  Location: [facility] ENDOSCOPY;  Service: Gastroenterology;;   HEMOSTASIS CONTROL  12/10/2023   Procedure: HEMOSTASIS CONTROL;  Surgeon: Selena Daily, MD;  Location: [facility] ENDOSCOPY;  Service: Gastroenterology;;   NECK SURGERY  12/01/2007   ORIF ANKLE FRACTURE Right 12/02/2022   Procedure: OPEN REDUCTION INTERNAL FIXATION (ORIF) ANKLE FRACTURE;  Surgeon: Rande Bushy, MD;  Location: [facility] ORS;  Service: Orthopedics;  Laterality: Right;   ORIF ANKLE FRACTURE Right 12/01/2022   Procedure: OPEN REDUCTION INTERNAL FIXATION (ORIF) ANKLE FRACTURE;  Surgeon: Rande Bushy, MD;  Location: [facility] ORS;  Service: Orthopedics;  Laterality: Right;   POLYPECTOMY  12/10/2023   Procedure: POLYPECTOMY;  Surgeon: Selena Daily, MD;  Location: [facility] ENDOSCOPY;  Service: Gastroenterology;;   PORTA CATH INSERTION N/A 04/03/2021   Procedure: PORTA CATH INSERTION;  Surgeon: Celso College, MD;  Location: [facility] INVASIVE CV LAB;  Service: Cardiovascular;  Laterality: N/A;   SUBMUCOSAL LIFTING INJECTION  12/10/2023   Procedure: SUBMUCOSAL LIFTING INJECTION;  Surgeon: Selena Daily, MD;  Location: [facility] ENDOSCOPY;  Service: Gastroenterology;;   SUBMUCOSAL TATTOO INJECTION  12/10/2023   Procedure: SUBMUCOSAL TATTOO INJECTION;  Surgeon: Selena Daily, MD;  Location: [facility] ENDOSCOPY;  Service: Gastroenterology;;   VIDEO BRONCHOSCOPY WITH ENDOBRONCHIAL ULTRASOUND Bilateral 10/14/2022   Procedure: VIDEO BRONCHOSCOPY WITH ENDOBRONCHIAL ULTRASOUND;  Surgeon: Marc Senior, MD;  Location: [facility] ORS;   Service: Pulmonary;  Laterality: Bilateral;    Social History   Socioeconomic History   Marital status: Divorced    Spouse name: Not on file   Number of children: 1   Years of education: Not on file   Highest education level: Not on file  Occupational History   Occupation: disability  Tobacco Use   Smoking status: Former    Types: Cigars    Quit date: 11/30/2022    Years since quitting: 1.4    Passive exposure: Past   Smokeless tobacco: Never  Vaping Use   Vaping status: Former   Start date: 04/30/2017   Quit date: 11/30/2017  Substance and Sexual Activity   Alcohol use: Yes    Alcohol/week: 42.0 standard drinks of alcohol    Types: 42 Cans of beer per week    Comment: beers 6 a day   Drug use: No   Sexual activity: Not Currently  Other Topics Concern   Not on file  Social History Narrative   Not on file   Social Drivers of Health   Financial Resource Strain: Low Risk  (04/18/2024)   Overall Financial Resource Strain (CARDIA)    Difficulty of Paying Living Expenses: Not very hard  Food Insecurity: No Food Insecurity (04/18/2024)   Hunger Vital Sign    Worried About Running Out of Food in the Last Year: Never true    Ran Out of Food in the Last Year: Never true  Transportation Needs: No Transportation Needs (04/18/2024)   PRAPARE - Administrator, Civil Service (Medical): No    Lack of Transportation (Non-Medical): No  Physical Activity: Insufficiently Active (04/18/2024)   Exercise Vital Sign    Days of Exercise per Week: 2 days    Minutes of Exercise per Session: 40 min  Stress: No Stress Concern Present (04/18/2024)   [name] of Occupational Health - Occupational Stress Questionnaire    Feeling of Stress : Not at all  Social Connections: Socially Isolated (04/18/2024)   Social Connection and Isolation Panel [NHANES]    Frequency of Communication with Friends and Family: More than three times a week    Frequency of Social Gatherings with Friends  and Family: Twice a week    Attends Religious Services: Never    Database administrator or Organizations: No    Attends Banker Meetings: Never    Marital Status: Divorced  Catering manager Violence: Not At Risk (04/18/2024)   Humiliation, Afraid, Rape, and Kick questionnaire    Fear of Current or Ex-Partner: No    Emotionally Abused: No    Physically Abused: No    Sexually Abused: No    Family History  Problem Relation Age of Onset   Diabetes Mother    Heart attack Father    Emphysema Father    Diabetes Other      Current Outpatient Medications:    IVERMECTIN PO, 60 mg., Disp: , Rfl:    albuterol  (VENTOLIN  HFA) 108 (90 Base) MCG/ACT inhaler, Inhale 2 puffs into the lungs every 6 (six) hours as needed for wheezing or shortness of breath., Disp: 18 g, Rfl: 6   atorvastatin  (LIPITOR ) 80 MG tablet, Take 1 tablet (80 mg total) by mouth daily. (Patient not taking: Reported on 04/25/2024), Disp: 90 tablet, Rfl: 3   buPROPion  (WELLBUTRIN  XL) 300 MG 24 hr tablet, Take 300 mg by mouth daily., Disp: , Rfl:    cetirizine  (ZYRTEC ) 10 MG tablet, Take 1 tablet (10 mg total) by mouth daily., Disp: 30 tablet, Rfl: 11   clonazePAM  (KLONOPIN ) 0.5 MG tablet, Take 0.5 mg by mouth every evening., Disp: , Rfl:    cyclobenzaprine  (FLEXERIL ) 10 MG tablet, Take 10 mg by mouth 3 (three) times daily., Disp: , Rfl:    diphenhydrAMINE  (BENADRYL ) 25 mg capsule, Starting 2 days prior to your chemotherapy infusions, take 1 pill (25 mg) by mouth daily. Do not take on day of chemotherapy. (Patient not taking: Reported on 04/18/2024), Disp: 30 capsule, Rfl: 0   DULoxetine  (CYMBALTA ) 60 MG capsule, Take 60 mg by mouth every evening., Disp: , Rfl:    famotidine  (PEPCID ) 20 MG tablet, Starting 2 days prior to your chemotherapy infusions, take 1 pill (20 mg) by mouth daily. Do not take on day of chemotherapy. (Patient not taking: Reported on 04/18/2024), Disp: 60 tablet, Rfl: 0   finasteride  (PROSCAR ) 5 MG  tablet, Take 1 tablet (5 mg total) by mouth daily., Disp: 90 tablet, Rfl: 1  fluorouracil  (EFUDEX ) 5 % cream, Apply topically 2 (two) times daily. Apply to aa's R arm, R lower leg, and R neck BID until reaction occurs., Disp: 30 g, Rfl: 1   fluticasone  (FLONASE ) 50 MCG/ACT nasal spray, Place 2 sprays into both nostrils daily., Disp: 16 g, Rfl: 6   gabapentin  (NEURONTIN ) 100 MG capsule, TAKE 1 CAPSULE BY MOUTH AT BEDTIME, Disp: 90 capsule, Rfl: 0   Ivermectin 6 MG TABS, Take 1 tablet by mouth daily., Disp: , Rfl:    lenalidomide  (REVLIMID ) 15 MG capsule, TAKE 1 CAPSULE BY MOUTH DAILY FOR 21 DAYS, THEN HOLD FOR 7 DAYS. REPEAT EVERY 28 DAYS (Patient not taking: Reported on 04/18/2024), Disp: 21 capsule, Rfl: 0   lenalidomide  (REVLIMID ) 15 MG capsule, Take 1 capsule (15 mg total) by mouth daily. Take for 21 days, then hold for 7 days. Repeat every 28 days. (Patient not taking: Reported on 04/18/2024), Disp: 21 capsule, Rfl: 0   metoprolol  succinate (TOPROL -XL) 100 MG 24 hr tablet, TAKE 1 TABLET BY MOUTH ONCE DAILY WITH OR IMMEDIATELY FOLLOWING A MEAL, Disp: 90 tablet, Rfl: 0   mirtazapine  (REMERON ) 15 MG tablet, Take 15 mg by mouth at bedtime., Disp: , Rfl:    montelukast  (SINGULAIR ) 10 MG tablet, TAKE 1 TABLET EVERY DAY (Patient not taking: Reported on 04/18/2024), Disp: 90 tablet, Rfl: 1   naltrexone  (DEPADE) 50 MG tablet, Take 50 mg by mouth daily., Disp: , Rfl:    ondansetron  (ZOFRAN ) 4 MG tablet, Take 1 tablet (4 mg total) by mouth every 8 (eight) hours as needed for nausea or vomiting., Disp: 30 tablet, Rfl: 1   sildenafil  (VIAGRA ) 100 MG tablet, Take 1 tablet (100 mg total) by mouth daily as needed for erectile dysfunction. Take two hours prior to intercourse on an empty stomach, Disp: 30 tablet, Rfl: 3   tenofovir  alafenamide (VEMLIDY ) 25 MG tablet, Take 1 tablet (25 mg total) by mouth daily. (Patient not taking: Reported on 04/18/2024), Disp: 30 tablet, Rfl: 1   testosterone  cypionate  (DEPOTESTOSTERONE CYPIONATE) 200 MG/ML injection, Inject 1 mL (200 mg total) into the muscle every 14 (fourteen) days. INJECT 1ML INTRAMUSCULARLY EVERY 14 DAYS, Disp: 10 mL, Rfl: 0 No current facility-administered medications for this visit.  Facility-Administered Medications Ordered in Other Visits:    heparin  lock flush 100 unit/mL, 500 Units, Intravenous, Once, Avonne Boettcher, MD   heparin  lock flush 100 unit/mL, 500 Units, Intravenous, Once, Avonne Boettcher, MD   sodium chloride  flush (NS) 0.9 % injection 10 mL, 10 mL, Intravenous, PRN, Avonne Boettcher, MD  Physical exam:  Vitals:   04/25/24 1417  BP: 106/69  Pulse: 87  Resp: 19  Temp: 98 F (36.7 C)  TempSrc: Tympanic  SpO2: 97%  Weight: 246 lb 4.8 oz (111.7 kg)  Height: 6\' 2"  (1.88 m)   Physical Exam Cardiovascular:     Rate and Rhythm: Normal rate and regular rhythm.     Heart sounds: Normal heart sounds.  Pulmonary:     Effort: Pulmonary effort is normal.     Breath sounds: Normal breath sounds.  Skin:    General: Skin is warm and dry.  Neurological:     Mental Status: He is alert and oriented to person, place, and time.      I have personally reviewed labs listed below:    Latest Ref Rng & Units 04/25/2024    3:21 PM  CMP  Glucose 70 - 99 mg/dL 79   BUN 8 - 23  mg/dL 14   Creatinine 1.61 - 1.24 mg/dL 0.96   Sodium 045 - 409 mmol/L 131   Potassium 3.5 - 5.1 mmol/L 4.3   Chloride 98 - 111 mmol/L 98   CO2 22 - 32 mmol/L 23   Calcium  8.9 - 10.3 mg/dL 9.9   Total Protein 6.5 - 8.1 g/dL 5.6   Total Bilirubin 0.0 - 1.2 mg/dL 0.9   Alkaline Phos 38 - 126 U/L 126   AST 15 - 41 U/L 20   ALT 0 - 44 U/L 13       Latest Ref Rng & Units 04/25/2024    3:21 PM  CBC  WBC 4.0 - 10.5 K/uL 8.2   Hemoglobin 13.0 - 17.0 g/dL 81.1   Hematocrit 91.4 - 52.0 % 37.4   Platelets 150 - 400 K/uL 103    I have personally reviewed Radiology images listed below: No images are attached to the encounter.  CT CHEST ABDOMEN PELVIS W  CONTRAST Result Date: 04/20/2024 CLINICAL DATA:  Grade 1 follicular lymphoma, follow-up. * Tracking Code: BO * EXAM: CT CHEST, ABDOMEN, AND PELVIS WITH CONTRAST TECHNIQUE: Multidetector CT imaging of the chest, abdomen and pelvis was performed following the standard protocol during bolus administration of intravenous contrast. RADIATION DOSE REDUCTION: This exam was performed according to the departmental dose-optimization program which includes automated exposure control, adjustment of the mA and/or kV according to patient size and/or use of iterative reconstruction technique. CONTRAST:  OMNIPAQUE  IOHEXOL  300 MG/ML  SOLN COMPARISON:  Multiple priors including CT November 22, 2023 FINDINGS: CT CHEST FINDINGS Cardiovascular: Right chest Port-A-Cath with tip in the SVC. Aortic atherosclerosis. Coronary artery calcifications. Normal size heart. Mediastinum/Nodes: Progressive bilateral low cervical/supraclavicular, subpectoral, axillary, mediastinal retrocrural and pericardiophrenic lymphadenopathy. For reference: -high right paratracheal lymph node measures 19 mm in short axis on image 15/2 previously 16 mm. -right axillary lymph node measures 17 mm in short axis on image 20/2 previously 16 mm -subcarinal lymph node measures 2.3 cm in short axis on image 31/2 previously 18 mm -right pericardiophrenic lymph node measures 14 mm in short axis on image 53/2 previously 8 mm. Lungs/Pleura: Stable subpleural bilateral lower lobe pulmonary nodules which were non hypermetabolic on prior PET-CT for instance a 14 mm nodule in the right lower lobe on image 105/4. No new suspicious pulmonary nodules or masses. Scattered atelectasis/scarring. No pleural effusion. No pneumothorax. Musculoskeletal: No aggressive lytic or blastic lesion of bone. Bilateral rib fractures of varying degrees of chronicity/healing are again seen. No new fracture identified. Thoracic diffuse idiopathic skeletal hyperostosis. CT ABDOMEN PELVIS FINDINGS  Hepatobiliary: Hepatomegaly. No suspicious hepatic lesion. Gallbladder is non distended. Pancreas: No pancreatic ductal dilation. Spleen: Similar splenomegaly measuring 20 cm in maximum craniocaudal dimension. Adrenals/Urinary Tract: No suspicious adrenal nodule/mass. No hydronephrosis. New prominence of the bilateral renal pelvis with progressive perinephric stranding. Urinary bladder is minimally distended. Stomach/Bowel: Stomach is minimally distended. No pathologic dilation of small or large bowel. Vascular/Lymphatic: Aortic atherosclerosis. Progressive retroperitoneal mesenteric pelvic and inguinal adenopathy. For reference: -nodal conglomerate surrounding the aorta and IVC measures 8.5 x 6.1 cm on image 88/2. Previously this reflected multiple noncontiguous lymph nodes. -celiac axis lymph node measures 2.8 cm in short axis on image 69/2 previously 1.9 cm. -right external iliac lymph node measures 17 mm in short axis on image 114/2 previously 15 mm. -left external iliac lymph node measures 19 mm in short axis on image 113/2 previously 12 mm. Reproductive: Prostate is unremarkable. Other: New small volume abdominopelvic ascites with  mesenteric edema and retroperitoneal stranding. Musculoskeletal: No aggressive lytic or blastic lesion of bone. Multilevel degenerative changes spine. IMPRESSION: 1. Progressive adenopathy above and below the diaphragm with similar splenomegaly, consistent with progressive lymphoma. 2. New small volume abdominopelvic ascites with mesenteric edema and retroperitoneal stranding. 3. New prominence of the bilateral renal pelvis with progressive perinephric stranding concerning for a degree of flow impedance related to the retroperitoneal lymphoproliferative process, suggest correlation with laboratory values to exclude superimposed infection. 4. Stable subpleural bilateral lower lobe pulmonary nodules which were non hypermetabolic on prior PET-CT. 5.  Aortic Atherosclerosis  (ICD10-I70.0). Electronically Signed   By: Tama Fails M.D.   On: 04/20/2024 14:35     Assessment and plan- Patient is a 63 y.o. male with history of recurrent follicular lymphoma here to discuss CT scan results and further management  Patient had progressive splenomegaly back in January 2025 and there was also concern for gradually worsening adenopathy at that time.  Decision was made to offer him second line Revlimid  plus Rituxan .  He tolerated the combination poorly as evidenced by significant rash with Revlimid .  He decided to stop treatment altogether at that time.I have reviewed his present CT chest abdomen and pelvis images independently and discussed findings with the patient which shows gradual worsening of his adenopathy both above and below the diaphragm.  Especially below the diaphragm there were multiple noncontiguous lymph nodes noted previously which are now unknown.  Pulm Lomanate  around aorta and IVC measuring 8.5 x 6.1 cm.  Spleen size has remained more or less stable around 20 cm.  I have reemphasized the importance of starting treatment for his follicular lymphoma but before I do that I would like to proceed with a PET scan to rule out any DLBCL transformation and need for a repeat biopsy.  Patient is very hesitant to do any treatment for his follicular lymphoma and wants to leave it to God at this time.  He understands that without treatment the lymph nodes can continue to grow and especially the ones near the aorta and the IVC can cause compression on the blood vessels as well as around the kidney and lead to obstructive symptoms.  He is willing to proceed with a PET scan and possible biopsy at this time.  He is against the idea of any infusion based treatments.  I also discussed going back to [facility] for consideration of bispecific treatment and he does not want that either.  I will look into the possibility of single agent Zanubrutinib in this situation.  I will see him back after PET  scan and biopsy.  CBC with differential CMP and LDH today   Visit Diagnosis 1. 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)   2. Goals of care, counseling/discussion      Dr. Seretha Dance, MD, MPH [facility] [phone] 04/26/2024 8:21 AM

## 2024-04-27 ENCOUNTER — Ambulatory Visit: Payer: Medicare HMO | Admitting: Nurse Practitioner

## 2024-04-27 ENCOUNTER — Other Ambulatory Visit: Payer: Self-pay | Admitting: Nurse Practitioner

## 2024-04-28 NOTE — Telephone Encounter (Signed)
 Requested Prescriptions  Pending Prescriptions Disp Refills   fluticasone  (FLONASE ) 50 MCG/ACT nasal spray [Pharmacy Med Name: FLUTICASONE  PROPIONATE 50 MCG/ACT N] 16 g 1    Sig: PLACE 2 SPRAYS INTO BOTH NOSTRIL ONCE DAILY     Ear, Nose, and Throat: Nasal Preparations - Corticosteroids Passed - 04/28/2024  9:52 PM      Passed - Valid encounter within last 12 months    Recent Outpatient Visits           3 months ago Annual physical exam   [facility] Aileen Alexanders, NP       Future Appointments             In 4 days McGowan, Nyra Bellis [facility] [place]   In 1 month Harris Liming, MD [facility]   In 4 months Harris Liming, MD [facility]

## 2024-04-28 NOTE — Progress Notes (Signed)
 05/02/2024 9:28 AM   Russell Simpson 10/18/1961 [id number]  Referring provider: Aileen Alexanders, NP [address] [place],  [place] [address]  Urological history: 1. Hypogonadotrophic hypogonadism - Testosterone  level pending - Hematocrit/Hemoglobin (03/2024) 12.3/37.4 - testosterone  cypionate 200 mg/milliliters, 1 cc every 14 days   2.  Erectile dysfunction - failed PDE5i's - failed ICI    3. BPH with LU TS -PSA pending  -finasteride  5 mg daily  Chief Complaint  Patient presents with   Follow-up   HPI: Russell Simpson is a 63 y.o. man who presents today for follow up.    Previous records reviewed.   SHIM 5  He is not having any erections.  He was given some medications from a holistic provider and they were ineffective as well.   He is no longer having spontaneous erections.  He denies any curve or pain with erections previously.   SHIM     Row Name 05/02/24 0849         SHIM: Over the last 6 months:   How do you rate your confidence that you could get and keep an erection? Very Low     When you had erections with sexual stimulation, how often were your erections hard enough for penetration (entering your partner)? Almost Never or Never     During sexual intercourse, how often were you able to maintain your erection after you had penetrated (entered) your partner? Almost Never or Never     During sexual intercourse, how difficult was it to maintain your erection to completion of intercourse? Extremely Difficult     When you attempted sexual intercourse, how often was it satisfactory for you? Almost Never or Never       SHIM Total Score   SHIM 5              Score: 1-7 Severe ED 8-11 Moderate ED 12-16 Mild-Moderate ED 17-21 Mild ED 22-25 No ED   I PSS 9/3  He is having both daytime and nighttime urinary urgency and frequency.  PVR 98 mL.  He states he has been experiencing the symptoms since his colonoscopy 6 months ago.  Patient denies  any modifying or aggravating factors.  Patient denies any recent UTI's, gross hematuria, dysuria or suprapubic/flank pain.  Patient denies any fevers, chills, nausea or vomiting.     IPSS     Row Name 05/02/24 0800         International Prostate Symptom Score   How often have you had the sensation of not emptying your bladder? Less than half the time     How often have you had to urinate less than every two hours? Less than half the time     How often have you found you stopped and started again several times when you urinated? Not at All     How often have you found it difficult to postpone urination? Less than 1 in 5 times     How often have you had a weak urinary stream? Less than 1 in 5 times     How often have you had to strain to start urination? Not at All     How many times did you typically get up at night to urinate? 3 Times     Total IPSS Score 9       Quality of Life due to urinary symptoms   If you were to spend the rest of your life with your urinary condition  just the way it is now how would you feel about that? Mixed                Score:  1-7 Mild 8-19 Moderate 20-35 Severe    PMH: 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HCC)    Arthritis    Asthma    Basal cell carcinoma 01/28/2021   R upper arm, EDC 03/04/2021   Basal cell carcinoma 12/31/2022   Right temporal hair line. Nodular. 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   right upper back ED&C done   Squamous cell carcinoma of skin 02/19/2021   L inferior mandible, treated with EDC   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/28/2021   R ant shoulder - ED&C   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/28/2021   L upper abdomen - ED&C    Surgical History: Past Surgical History:  Procedure Laterality Date   ANTERIOR CERVICAL DECOMP/DISCECTOMY FUSION N/A 06/17/2016   Procedure: ANTERIOR CERVICAL DECOMPRESSION FUSION, CERVICAL 3-4, CERVICAL 4-5 WITH INSTRUMENTATION AND ALLOGRAFT;  Surgeon: Virl Grimes, MD;  Location: [facility] OR;  Service: Orthopedics;  Laterality: N/A;  ANTERIOR CERVICAL DECOMPRESSION FUSION, CERVICAL 3-4, CERVICAL 4-5 WITH INSTRUMENTATION AND ALLOGRAFT   BACK SURGERY     x3   COLONOSCOPY WITH PROPOFOL  N/A 12/10/2023   Procedure: COLONOSCOPY WITH PROPOFOL ;  Surgeon: Selena Daily, MD;  Location: [facility] ENDOSCOPY;  Service: Gastroenterology;  Laterality: N/A;   HARDWARE REMOVAL Right 05/04/2023   Procedure: HARDWARE  REMOVAL;  Surgeon: Rande Bushy, MD;  Location: [facility] ORS;  Service: Orthopedics;  Laterality: Right;   HEMOSTASIS CLIP PLACEMENT  12/10/2023   Procedure: HEMOSTASIS CLIP PLACEMENT;  Surgeon: Selena Daily, MD;  Location: [facility] ENDOSCOPY;  Service: Gastroenterology;;   HEMOSTASIS CONTROL  12/10/2023   Procedure: HEMOSTASIS CONTROL;  Surgeon:  Selena Daily, MD;  Location[facility] ENDOSCOPY;  Service: Gastroenterology;;   NECK SURGERY  12/01/2007   ORIF ANKLE FRACTURE Right 12/02/2022   Procedure: OPEN REDUCTION INTERNAL FIXATION (ORIF) ANKLE FRACTURE;  Surgeon: Rande Bushy, MD;  Location: [facility] ORS;  Service: Orthopedics;  Laterality: Right;   ORIF ANKLE FRACTURE Right 12/01/2022   Procedure: OPEN REDUCTION INTERNAL FIXATION (ORIF) ANKLE FRACTURE;  Surgeon: Rande Bushy, MD;  Location: [facility] ORS;  Service: Orthopedics;  Laterality: Right;   POLYPECTOMY  12/10/2023   Procedure: POLYPECTOMY;  Surgeon: Selena Daily, MD;  Location: [facility] ENDOSCOPY;  Service: Gastroenterology;;   PORTA CATH INSERTION N/A 04/03/2021   Procedure: PORTA CATH INSERTION;  Surgeon: Celso College, MD;  Location: [facility] INVASIVE CV LAB;  Service: Cardiovascular;  Laterality: N/A;   SUBMUCOSAL LIFTING INJECTION  12/10/2023   Procedure: SUBMUCOSAL LIFTING INJECTION;  Surgeon: Selena Daily, MD;  Location: [facility] ENDOSCOPY;  Service: Gastroenterology;;   SUBMUCOSAL TATTOO INJECTION  12/10/2023   Procedure: SUBMUCOSAL TATTOO INJECTION;  Surgeon: Selena Daily, MD;  Location: [facility] ENDOSCOPY;  Service: Gastroenterology;;   VIDEO BRONCHOSCOPY WITH ENDOBRONCHIAL ULTRASOUND Bilateral 10/14/2022   Procedure: VIDEO BRONCHOSCOPY WITH ENDOBRONCHIAL ULTRASOUND;  Surgeon: Marc Senior, MD;  Location: [facility] ORS;  Service: Pulmonary;  Laterality: Bilateral;    Home Medications:  Allergies as of 05/02/2024       Reactions   Penicillins Anaphylaxis   Tolerates cefdinir , cephalexin and amoxicillin/clavulonate so  true PCN allergy unlikely   Tetanus Toxoid Swelling   Tetanus Toxoid, Adsorbed Swelling   Tetanus Toxoids Swelling   Lisinopril Cough   Losartan    Other reaction(s): Muscle Pain   Codeine Nausea Only, Nausea And Vomiting   Doxycycline  Rash   Ruxience  [rituximab -pvvr] Nausea And Vomiting, Rash, Other (See Comments)   05/06/21 pt w/ worsening rash during Ruxience  infusion.  Face, neck and chest flushing redness 12/27/2023: flushing, diaphoresis, nausea and vomiting        Medication List        Accurate as of May 02, 2024  9:28 AM. If you have any questions, ask your nurse or doctor.          albuterol  108 (90 Base) MCG/ACT inhaler Commonly known as: VENTOLIN  HFA Inhale 2 puffs into the lungs every 6 (six) hours as needed for wheezing or shortness of breath.   atorvastatin  80 MG tablet Commonly known as: LIPITOR  Take 1 tablet (80 mg total) by mouth daily.   buPROPion  300 MG 24 hr tablet Commonly known as: WELLBUTRIN  XL Take 300 mg by mouth daily.   cetirizine  10 MG tablet Commonly known as: ZYRTEC  Take 1 tablet (10 mg total) by mouth daily.   clonazePAM  0.5 MG tablet Commonly known as: KLONOPIN  Take 0.5 mg by mouth every evening.   cyclobenzaprine  10 MG tablet Commonly known as: FLEXERIL  Take 10 mg by mouth 3 (three) times daily.   DULoxetine  60 MG capsule Commonly known as: CYMBALTA  Take 60 mg by mouth every evening.   finasteride  5 MG tablet Commonly known as: PROSCAR  Take 1 tablet (5 mg total) by mouth daily.   fluorouracil  5 % cream Commonly known as: EFUDEX  Apply topically 2 (two) times daily. Apply to aa's R arm, R lower leg, and R neck BID until reaction occurs.   fluticasone  50 MCG/ACT nasal spray Commonly known as: FLONASE  PLACE 2 SPRAYS INTO BOTH NOSTRIL ONCE DAILY   gabapentin  100 MG capsule Commonly known as: NEURONTIN  TAKE 1 CAPSULE BY MOUTH AT BEDTIME  Ivermectin 6 MG Tabs Take 1 tablet by mouth daily.   metoprolol  succinate 100 MG  24 hr tablet Commonly known as: TOPROL -XL TAKE 1 TABLET BY MOUTH ONCE DAILY WITH OR IMMEDIATELY FOLLOWING A MEAL   mirtazapine  15 MG tablet Commonly known as: REMERON  Take 15 mg by mouth at bedtime.   montelukast  10 MG tablet Commonly known as: SINGULAIR  TAKE 1 TABLET EVERY DAY   naltrexone  50 MG tablet Commonly known as: DEPADE Take 50 mg by mouth daily.   ondansetron  4 MG tablet Commonly known as: Zofran  Take 1 tablet (4 mg total) by mouth every 8 (eight) hours as needed for nausea or vomiting.   sildenafil  100 MG tablet Commonly known as: VIAGRA  Take 1 tablet (100 mg total) by mouth daily as needed for erectile dysfunction. Take two hours prior to intercourse on an empty stomach   tamsulosin  0.4 MG Caps capsule Commonly known as: FLOMAX  Take 1 capsule (0.4 mg total) by mouth daily. Started by: Luvern Mcisaac   testosterone  cypionate 200 MG/ML injection Commonly known as: DEPOTESTOSTERONE CYPIONATE Inject 1 mL (200 mg total) into the muscle every 14 (fourteen) days. INJECT 1ML INTRAMUSCULARLY EVERY 14 DAYS   Vemlidy  25 MG tablet Generic drug: tenofovir  alafenamide Take 1 tablet (25 mg total) by mouth daily.        Allergies:  Allergies  Allergen Reactions   Penicillins Anaphylaxis    Tolerates cefdinir , cephalexin and amoxicillin/clavulonate so true PCN allergy unlikely   Tetanus Toxoid Swelling   Tetanus Toxoid, Adsorbed Swelling   Tetanus Toxoids Swelling   Lisinopril Cough   Losartan      Other reaction(s): Muscle Pain     Codeine Nausea Only and Nausea And Vomiting   Doxycycline  Rash   Ruxience  [Rituximab -Pvvr] Nausea And Vomiting, Rash and Other (See Comments)    05/06/21 pt w/ worsening rash during Ruxience  infusion.  Face, neck and chest flushing redness  12/27/2023: flushing, diaphoresis, nausea and vomiting    Family History: Family History  Problem Relation Age of Onset   Diabetes Mother    Heart attack Father    Emphysema Father     Diabetes Other     Social History:  reports that he quit smoking about 17 months ago. His smoking use included cigars. He has been exposed to tobacco smoke. He has never used smokeless tobacco. He reports current alcohol use of about 42.0 standard drinks of alcohol per week. He reports that he does not use drugs.  ROS: Pertinent ROS in HPI  Physical Exam: BP 126/81   Pulse 87   Ht 6\' 2"  (1.88 m)   Wt 230 lb (104.3 kg)   SpO2 93%   BMI 29.53 kg/m   Constitutional:  Well nourished. Alert and oriented, No acute distress. HEENT: [place] AT, moist mucus membranes.  Trachea midline Cardiovascular: No clubbing, cyanosis, or edema. Respiratory: Normal respiratory effort, no increased work of breathing. Neurologic: Grossly intact, no focal deficits, moving all 4 extremities. Psychiatric: Normal mood and affect.   Laboratory Data: Lab Results  Component Value Date   WBC 8.0 05/01/2024   HGB 13.5 05/01/2024   HCT 40.5 05/01/2024   MCV 91 05/01/2024   PLT 124 (L) 05/01/2024   Lab Results  Component Value Date   CREATININE 1.29 (H) 05/01/2024   Lab Results  Component Value Date   HGBA1C 5.2 05/01/2024      Component Value Date/Time   CHOL 162 05/01/2024 1505   HDL 24 (L) 05/01/2024 1505  CHOLHDL 6.8 (H) 05/01/2024 1505   CHOLHDL 3.8 06/23/2022 1327   VLDL 19 06/23/2022 1327   LDLCALC 114 (H) 05/01/2024 1505   LDLCALC 108 (H) 10/15/2017 0906   Lab Results  Component Value Date   AST 21 05/01/2024   Lab Results  Component Value Date   ALT 10 05/01/2024  I have reviewed the labs.   Pertinent Imaging: CLINICAL DATA:  Grade 1 follicular lymphoma, follow-up. * Tracking Code: BO *   EXAM: CT CHEST, ABDOMEN, AND PELVIS WITH CONTRAST   TECHNIQUE: Multidetector CT imaging of the chest, abdomen and pelvis was performed following the standard protocol during bolus administration of intravenous contrast.   RADIATION DOSE REDUCTION: This exam was performed according to  the departmental dose-optimization program which includes automated exposure control, adjustment of the mA and/or kV according to patient size and/or use of iterative reconstruction technique.   CONTRAST:  OMNIPAQUE  IOHEXOL  300 MG/ML  SOLN   COMPARISON:  Multiple priors including CT November 22, 2023   FINDINGS: CT CHEST FINDINGS   Cardiovascular: Right chest Port-A-Cath with tip in the SVC. Aortic atherosclerosis. Coronary artery calcifications. Normal size heart.   Mediastinum/Nodes: Progressive bilateral low cervical/supraclavicular, subpectoral, axillary, mediastinal retrocrural and pericardiophrenic lymphadenopathy. For reference:   -high right paratracheal lymph node measures 19 mm in short axis on image 15/2 previously 16 mm.   -right axillary lymph node measures 17 mm in short axis on image 20/2 previously 16 mm   -subcarinal lymph node measures 2.3 cm in short axis on image 31/2 previously 18 mm   -right pericardiophrenic lymph node measures 14 mm in short axis on image 53/2 previously 8 mm.   Lungs/Pleura: Stable subpleural bilateral lower lobe pulmonary nodules which were non hypermetabolic on prior PET-CT for instance a 14 mm nodule in the right lower lobe on image 105/4.   No new suspicious pulmonary nodules or masses.   Scattered atelectasis/scarring. No pleural effusion. No pneumothorax.   Musculoskeletal: No aggressive lytic or blastic lesion of bone. Bilateral rib fractures of varying degrees of chronicity/healing are again seen. No new fracture identified. Thoracic diffuse idiopathic skeletal hyperostosis.   CT ABDOMEN PELVIS FINDINGS   Hepatobiliary: Hepatomegaly. No suspicious hepatic lesion. Gallbladder is non distended.   Pancreas: No pancreatic ductal dilation.   Spleen: Similar splenomegaly measuring 20 cm in maximum craniocaudal dimension.   Adrenals/Urinary Tract: No suspicious adrenal nodule/mass. No hydronephrosis. New  prominence of the bilateral renal pelvis with progressive perinephric stranding. Urinary bladder is minimally distended.   Stomach/Bowel: Stomach is minimally distended. No pathologic dilation of small or large bowel.   Vascular/Lymphatic: Aortic atherosclerosis.   Progressive retroperitoneal mesenteric pelvic and inguinal adenopathy. For reference:   -nodal conglomerate surrounding the aorta and IVC measures 8.5 x 6.1 cm on image 88/2. Previously this reflected multiple noncontiguous lymph nodes.   -celiac axis lymph node measures 2.8 cm in short axis on image 69/2 previously 1.9 cm.   -right external iliac lymph node measures 17 mm in short axis on image 114/2 previously 15 mm.   -left external iliac lymph node measures 19 mm in short axis on image 113/2 previously 12 mm.   Reproductive: Prostate is unremarkable.   Other: New small volume abdominopelvic ascites with mesenteric edema and retroperitoneal stranding.   Musculoskeletal: No aggressive lytic or blastic lesion of bone. Multilevel degenerative changes spine.   IMPRESSION: 1. Progressive adenopathy above and below the diaphragm with similar splenomegaly, consistent with progressive lymphoma. 2. New small volume abdominopelvic ascites  with mesenteric edema and retroperitoneal stranding. 3. New prominence of the bilateral renal pelvis with progressive perinephric stranding concerning for a degree of flow impedance related to the retroperitoneal lymphoproliferative process, suggest correlation with laboratory values to exclude superimposed infection. 4. Stable subpleural bilateral lower lobe pulmonary nodules which were non hypermetabolic on prior PET-CT. 5.  Aortic Atherosclerosis (ICD10-I70.0).     Electronically Signed   By: Tama Fails M.D.   On: 04/20/2024 14:35 I have independently reviewed the films.  See HPI.    Assessment & Plan:    1. Hypogonadism - testosterone  level pending -  HCT/hemoglobin (03/2024) normal  - continue testosterone  cypionate 200 mg/mL, 1 cc every 14 days, until results return - Return to clinic in 3 months for testosterone , hemoglobin and hematocrit  2. BPH with LU TS - PSA pending  - continue finasteride  5 mg daily - Patient with bothersome symptoms of urgency and frequency - Bladder scan notes suboptimal bladder emptying - Reviewing his records, he has stopped the tamsulosin , so we will go ahead and restart that - Follow-up in 3 months for IPSS and PVR  3. ED -failed PDE5i's  -failed ICI -discussed referral to [facility] for further evaluation for possible IPP since he has failed PDE 5 inhibitors and ICI, he deferred   4. Increase in serum creatinine - discussed the findings on his recent CT - explained that his renal function will need to be followed and if there is a continued worsening of function and imaging confirmed hydronephrosis, he may want to consider bilateral ureteral stent placement to preserve renal function   Return in about 3 months (around 08/02/2024) for Testosterone , hemoglobin, hematocrit, IPSS and PVR.  These notes generated with voice recognition software. I apologize for typographical errors.  Briant Camper  [facility] [address]  [address] [place], [place] [address] [phone]

## 2024-05-01 ENCOUNTER — Ambulatory Visit (INDEPENDENT_AMBULATORY_CARE_PROVIDER_SITE_OTHER): Admitting: Nurse Practitioner

## 2024-05-01 ENCOUNTER — Encounter: Payer: Self-pay | Admitting: Nurse Practitioner

## 2024-05-01 VITALS — BP 131/70 | HR 111 | Ht 74.0 in | Wt 246.0 lb

## 2024-05-01 DIAGNOSIS — J449 Chronic obstructive pulmonary disease, unspecified: Secondary | ICD-10-CM

## 2024-05-01 DIAGNOSIS — E039 Hypothyroidism, unspecified: Secondary | ICD-10-CM | POA: Diagnosis not present

## 2024-05-01 DIAGNOSIS — F1020 Alcohol dependence, uncomplicated: Secondary | ICD-10-CM

## 2024-05-01 DIAGNOSIS — C8202 Follicular lymphoma grade I, intrathoracic lymph nodes: Secondary | ICD-10-CM

## 2024-05-01 DIAGNOSIS — D352 Benign neoplasm of pituitary gland: Secondary | ICD-10-CM | POA: Diagnosis not present

## 2024-05-01 DIAGNOSIS — R7303 Prediabetes: Secondary | ICD-10-CM | POA: Insufficient documentation

## 2024-05-01 DIAGNOSIS — F3160 Bipolar disorder, current episode mixed, unspecified: Secondary | ICD-10-CM | POA: Diagnosis not present

## 2024-05-01 DIAGNOSIS — I10 Essential (primary) hypertension: Secondary | ICD-10-CM

## 2024-05-01 DIAGNOSIS — F1021 Alcohol dependence, in remission: Secondary | ICD-10-CM

## 2024-05-01 DIAGNOSIS — E785 Hyperlipidemia, unspecified: Secondary | ICD-10-CM

## 2024-05-01 DIAGNOSIS — I479 Paroxysmal tachycardia, unspecified: Secondary | ICD-10-CM | POA: Insufficient documentation

## 2024-05-01 DIAGNOSIS — I700 Atherosclerosis of aorta: Secondary | ICD-10-CM

## 2024-05-01 NOTE — Assessment & Plan Note (Addendum)
 Followed by oncology.  Last imaging showed worsening Adenopathy.  Patient has elected not to pursue treatment at this time.  He is scheduled for a PET scan this week.  Seeing a Press photographer and on Naltrexone  and Ivermectin to help with cancer.  Reviewed most recent note.

## 2024-05-01 NOTE — Assessment & Plan Note (Signed)
Chronic.  Controlled.  Continue with current medication regimen of Atorvastatin '80mg'$  daily.  Labs ordered today.  Return to clinic in 6 months for reevaluation.  Call sooner if concerns arise.

## 2024-05-01 NOTE — Assessment & Plan Note (Signed)
 Improved with Naltrexone .  Previously drinking a 12 pack per day.  Now only drinking 3-4 beers per day.

## 2024-05-01 NOTE — Assessment & Plan Note (Signed)
 Chronic.  Controlled.  Continue with current medication regimen.  Labs ordered today.  Return to clinic in 6 months for reevaluation.  Call sooner if concerns arise.  ? ?

## 2024-05-01 NOTE — Assessment & Plan Note (Signed)
 Chronic, ongoing.  Not a current smoker.  Using PRN.   Continue Albuterol  as needed at this time.

## 2024-05-01 NOTE — Progress Notes (Signed)
 BP 131/70   Pulse (!) 111   Ht 6\' 2"  (1.88 m)   Wt 246 lb (111.6 kg)   BMI 31.58 kg/m    Subjective:    Patient ID: Russell Simpson, male    DOB: July 11, 1961, 63 y.o.   MRN: [id number]  HPI: Russell Simpson is a 63 y.o. male  Chief Complaint  Patient presents with   Medical Management of Chronic Issues   HYPERTENSION / HYPERLIPIDEMIA Continues on Amlodipine , Metoprolol , and Atorvastatin .    A1c in January 2024 was 6.2%.  Satisfied with current treatment? yes Duration of hypertension: chronic BP monitoring frequency: not checking BP range:  BP medication side effects: no Duration of hyperlipidemia: chronic Cholesterol medication side effects: no Cholesterol supplements: none Medication compliance: good compliance Aspirin : no Recent stressors:  Recurrent headaches: no Visual changes: no Palpitations: no Dyspnea: no Chest pain: no Lower extremity edema: no Dizzy/lightheaded: no   COPD He quit smoking and stopped seeing Pulmonologist. He is trying to exercise and not sit at home for long periods of time. COPD status: stable Satisfied with current treatment?: yes Oxygen use: no Dyspnea frequency: no Cough frequency: no Rescue inhaler frequency: weekly Limitation of activity: no Productive cough: none Last Spirometry: with pulmonary Pneumovax: Up to Date Influenza: Up to Date   BIPOLAR DISORDER Continues to follow with psychiatry.  No changes at that time. He is frustrated by his chemo and dealing with the side effects of chemo.  Has trouble doing things around the house due to the chemo.  He is still seeing his psychiatrist- Denson Flake. Mood status: stable Satisfied with current treatment?: yes Symptom severity: mild  Duration of current treatment : chronic Side effects: no Medication compliance: good compliance Psychotherapy/counseling: no Depressed mood: no Anxious mood: no Anhedonia: no Significant weight loss or gain: no Insomnia:  no Fatigue: yes Feelings of worthlessness or guilt: no Impaired concentration/indecisiveness: no Suicidal ideations: no Hopelessness: no Crying spells: no  LYMPHOMA Seeing Dr. Randy Buttery.  Elects not to do any treatment at this time.  Does have a PET scan this week.  Patient is seeing a holistic doctor who has him on Naltrexone  and Ivermectin. States the Naltrexone  has really cut down on his drinking.  He was previously drinking about a 12 pack a day and only drinking 3-4 a day. He does still have his port in place.  Relevant past medical, surgical, family and social history reviewed and updated as indicated. Interim medical history since our last visit reviewed. Allergies and medications reviewed and updated.  Review of Systems  Eyes:  Negative for visual disturbance.  Respiratory:  Negative for shortness of breath.   Cardiovascular:  Negative for chest pain and leg swelling.  Neurological:  Negative for light-headedness and headaches.  Psychiatric/Behavioral:  Positive for dysphoric mood. Negative for sleep disturbance. The patient is nervous/anxious.     Per HPI unless specifically indicated above     Objective:     BP 131/70   Pulse (!) 111   Ht 6\' 2"  (1.88 m)   Wt 246 lb (111.6 kg)   BMI 31.58 kg/m   Wt Readings from Last 3 Encounters:  05/01/24 246 lb (111.6 kg)  04/25/24 246 lb 4.8 oz (111.7 kg)  04/18/24 230 lb (104.3 kg)    Physical Exam Vitals and nursing note reviewed.  Constitutional:      General: He is not in acute distress.    Appearance: Normal appearance. He is not ill-appearing, toxic-appearing or diaphoretic.  HENT:     Head: Normocephalic.     Right Ear: External ear normal.     Left Ear: External ear normal.     Nose: Nose normal. No congestion or rhinorrhea.     Mouth/Throat:     Mouth: Mucous membranes are moist.  Eyes:     General:        Right eye: No discharge.        Left eye: No discharge.     Extraocular Movements: Extraocular movements  intact.     Conjunctiva/sclera: Conjunctivae normal.     Pupils: Pupils are equal, round, and reactive to light.  Cardiovascular:     Rate and Rhythm: Normal rate and regular rhythm.     Heart sounds: No murmur heard. Pulmonary:     Effort: Pulmonary effort is normal. No respiratory distress.     Breath sounds: Normal breath sounds. No wheezing, rhonchi or rales.  Abdominal:     General: Abdomen is flat. Bowel sounds are normal.  Musculoskeletal:     Cervical back: Normal range of motion and neck supple.  Skin:    General: Skin is warm and dry.     Capillary Refill: Capillary refill takes less than 2 seconds.  Neurological:     General: No focal deficit present.     Mental Status: He is alert and oriented to person, place, and time.  Psychiatric:        Mood and Affect: Mood normal.        Behavior: Behavior normal.        Thought Content: Thought content normal.        Judgment: Judgment normal.     Results for orders placed or performed in visit on 04/25/24  CBC with Differential (Cancer Center Only)   Collection Time: 04/25/24  3:21 PM  Result Value Ref Range   WBC Count 8.2 4.0 - 10.5 K/uL   RBC 4.12 (L) 4.22 - 5.81 MIL/uL   Hemoglobin 12.3 (L) 13.0 - 17.0 g/dL   HCT 16.1 (L) 09.6 - 04.5 %   MCV 90.8 80.0 - 100.0 fL   MCH 29.9 26.0 - 34.0 pg   MCHC 32.9 30.0 - 36.0 g/dL   RDW 40.9 81.1 - 91.4 %   Platelet Count 103 (L) 150 - 400 K/uL   nRBC 0.0 0.0 - 0.2 %   Neutrophils Relative % 37 %   Neutro Abs 3.1 1.7 - 7.7 K/uL   Lymphocytes Relative 42 %   Lymphs Abs 3.4 0.7 - 4.0 K/uL   Monocytes Relative 14 %   Monocytes Absolute 1.2 (H) 0.1 - 1.0 K/uL   Eosinophils Relative 3 %   Eosinophils Absolute 0.2 0.0 - 0.5 K/uL   Basophils Relative 1 %   Basophils Absolute 0.1 0.0 - 0.1 K/uL   Immature Granulocytes 3 %   Abs Immature Granulocytes 0.25 (H) 0.00 - 0.07 K/uL  CMP (Cancer Center only)   Collection Time: 04/25/24  3:21 PM  Result Value Ref Range   Sodium 131  (L) 135 - 145 mmol/L   Potassium 4.3 3.5 - 5.1 mmol/L   Chloride 98 98 - 111 mmol/L   CO2 23 22 - 32 mmol/L   Glucose, Bld 79 70 - 99 mg/dL   BUN 14 8 - 23 mg/dL   Creatinine 7.82 (H) 9.56 - 1.24 mg/dL   Calcium  9.9 8.9 - 10.3 mg/dL   Total Protein 5.6 (L) 6.5 - 8.1 g/dL   Albumin 3.6 3.5 -  5.0 g/dL   AST 20 15 - 41 U/L   ALT 13 0 - 44 U/L   Alkaline Phosphatase 126 38 - 126 U/L   Total Bilirubin 0.9 0.0 - 1.2 mg/dL   GFR, Estimated >28 >41 mL/min   Anion gap 10 5 - 15  Lactate dehydrogenase   Collection Time: 04/25/24  3:21 PM  Result Value Ref Range   LDH 98 98 - 192 U/L   *Note: Due to a large number of results and/or encounters for the requested time period, some results have not been displayed. A complete set of results can be found in Results Review.      Assessment & Plan:   Problem List Items Addressed This Visit       Cardiovascular and Mediastinum   Aortic atherosclerosis (HCC)   Chronic.  Controlled.  Continue with current medication regimen of Atorvastatin  80mg  daily.  Labs ordered today.  Return to clinic in 6 months for reevaluation.  Call sooner if concerns arise.       Relevant Orders   Comp Met (CMET)   Essential hypertension   Chronic, controlled.  Recommend he monitor BP at least a few mornings a week at home and document.  DASH diet at home. Continue current medication regimen and adjust as needed.  Labs ordered today.  Refills as needed.        Paroxysmal tachycardia (HCC)   Chronic.  Controlled.  Continue with current medication regimen.  Labs ordered today.  Return to clinic in 6 months for reevaluation.  Call sooner if concerns arise.          Respiratory   Stage 2 moderate COPD by GOLD classification (HCC) - Primary   Chronic, ongoing.  Not a current smoker.  Using PRN.   Continue Albuterol  as needed at this time.        Endocrine   Pituitary adenoma (HCC)   Labs ordered at visit today.  Will make recommendations based on lab results.         Relevant Orders   Comp Met (CMET)   CBC w/Diff   Hypothyroidism   Chronic.  Controlled.  Continue with current medication regimen.  Labs ordered today.  Return to clinic in 6 months for reevaluation.  Call sooner if concerns arise.        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  Last imaging showed worsening Adenopathy.  Patient has elected not to pursue treatment at this time.  He is scheduled for a PET scan this week.  Seeing a Press photographer and on Naltrexone  and Ivermectin to help with cancer.  Reviewed most recent note.        Other   Hyperlipidemia   Chronic.  Controlled.  Continue with current medication regimen.  Labs ordered today.  Return to clinic in 6 months for reevaluation.  Call sooner if concerns arise.        Relevant Orders   Lipid Profile   Alcohol use disorder, severe, in early remission (HCC)   Improved with Naltrexone .  Previously drinking a 12 pack per day.  Now only drinking 3-4 beers per day.       Mixed bipolar I disorder (HCC)   Chronic, ongoing.  Followed by psychiatry.  Continue this collaboration.  Recent notes reviewed.  Denies SI/HI.      Relevant  Orders   Comp Met (CMET)   Prediabetes   Labs ordered at visit today.  Will make recommendations based on lab results.        Relevant Orders   HgB A1c   Other Visit Diagnoses       Alcohol use disorder, severe, dependence (HCC)   (Chronic)          Follow up plan: Return in about 6 months (around 10/31/2024) for HTN, HLD, DM2 FU.

## 2024-05-01 NOTE — Assessment & Plan Note (Signed)
 Labs ordered at visit today.  Will make recommendations based on lab results.

## 2024-05-01 NOTE — Assessment & Plan Note (Signed)
 Chronic, controlled.  Recommend he monitor BP at least a few mornings a week at home and document.  DASH diet at home. Continue current medication regimen and adjust as needed.  Labs ordered today.  Refills as needed.

## 2024-05-01 NOTE — Assessment & Plan Note (Signed)
 Chronic, ongoing.  Followed by psychiatry.  Continue this collaboration.  Recent notes reviewed.  Denies SI/HI.

## 2024-05-02 ENCOUNTER — Ambulatory Visit (INDEPENDENT_AMBULATORY_CARE_PROVIDER_SITE_OTHER): Admitting: Urology

## 2024-05-02 ENCOUNTER — Ambulatory Visit: Payer: Self-pay | Admitting: Nurse Practitioner

## 2024-05-02 ENCOUNTER — Encounter: Payer: Self-pay | Admitting: Urology

## 2024-05-02 ENCOUNTER — Other Ambulatory Visit: Payer: Self-pay | Admitting: Nurse Practitioner

## 2024-05-02 VITALS — BP 126/81 | HR 87 | Ht 74.0 in | Wt 230.0 lb

## 2024-05-02 DIAGNOSIS — R7989 Other specified abnormal findings of blood chemistry: Secondary | ICD-10-CM

## 2024-05-02 DIAGNOSIS — N401 Enlarged prostate with lower urinary tract symptoms: Secondary | ICD-10-CM | POA: Diagnosis not present

## 2024-05-02 DIAGNOSIS — N5201 Erectile dysfunction due to arterial insufficiency: Secondary | ICD-10-CM

## 2024-05-02 DIAGNOSIS — E291 Testicular hypofunction: Secondary | ICD-10-CM | POA: Diagnosis not present

## 2024-05-02 DIAGNOSIS — D352 Benign neoplasm of pituitary gland: Secondary | ICD-10-CM

## 2024-05-02 DIAGNOSIS — N138 Other obstructive and reflux uropathy: Secondary | ICD-10-CM

## 2024-05-02 DIAGNOSIS — E039 Hypothyroidism, unspecified: Secondary | ICD-10-CM

## 2024-05-02 DIAGNOSIS — T7840XD Allergy, unspecified, subsequent encounter: Secondary | ICD-10-CM

## 2024-05-02 LAB — LIPID PANEL
Chol/HDL Ratio: 6.8 ratio — ABNORMAL HIGH (ref 0.0–5.0)
Cholesterol, Total: 162 mg/dL (ref 100–199)
HDL: 24 mg/dL — ABNORMAL LOW (ref >39)
LDL Chol Calc (NIH): 114 mg/dL — ABNORMAL HIGH (ref 0–99)
Triglycerides: 129 mg/dL (ref 0–149)
VLDL Cholesterol Cal: 24 mg/dL (ref 5–40)

## 2024-05-02 LAB — COMPREHENSIVE METABOLIC PANEL WITH GFR
ALT: 10 IU/L (ref 0–44)
AST: 21 IU/L (ref 0–40)
Albumin: 4 g/dL (ref 3.9–4.9)
Alkaline Phosphatase: 166 IU/L — ABNORMAL HIGH (ref 44–121)
BUN/Creatinine Ratio: 8 — ABNORMAL LOW (ref 10–24)
BUN: 10 mg/dL (ref 8–27)
Bilirubin Total: 0.5 mg/dL (ref 0.0–1.2)
CO2: 21 mmol/L (ref 20–29)
Calcium: 10.3 mg/dL — ABNORMAL HIGH (ref 8.6–10.2)
Chloride: 96 mmol/L (ref 96–106)
Creatinine, Ser: 1.29 mg/dL — ABNORMAL HIGH (ref 0.76–1.27)
Globulin, Total: 1.4 g/dL — ABNORMAL LOW (ref 1.5–4.5)
Glucose: 153 mg/dL — ABNORMAL HIGH (ref 70–99)
Potassium: 4.2 mmol/L (ref 3.5–5.2)
Sodium: 132 mmol/L — ABNORMAL LOW (ref 134–144)
Total Protein: 5.4 g/dL — ABNORMAL LOW (ref 6.0–8.5)
eGFR: 63 mL/min/{1.73_m2} (ref >59)

## 2024-05-02 LAB — CBC WITH DIFFERENTIAL/PLATELET
Basophils Absolute: 0.1 10*3/uL (ref 0.0–0.2)
Basos: 1 %
EOS (ABSOLUTE): 0.6 10*3/uL — ABNORMAL HIGH (ref 0.0–0.4)
Eos: 7 %
Hematocrit: 40.5 % (ref 37.5–51.0)
Hemoglobin: 13.5 g/dL (ref 13.0–17.7)
Lymphocytes Absolute: 3.3 10*3/uL — ABNORMAL HIGH (ref 0.7–3.1)
Lymphs: 41 %
MCH: 30.4 pg (ref 26.6–33.0)
MCHC: 33.3 g/dL (ref 31.5–35.7)
MCV: 91 fL (ref 79–97)
Monocytes Absolute: 0.9 10*3/uL (ref 0.1–0.9)
Monocytes: 11 %
Neutrophils Absolute: 3.1 10*3/uL (ref 1.4–7.0)
Neutrophils: 38 %
Platelets: 124 10*3/uL — ABNORMAL LOW (ref 150–450)
RBC: 4.44 x10E6/uL (ref 4.14–5.80)
RDW: 12.7 % (ref 11.6–15.4)
WBC: 8 10*3/uL (ref 3.4–10.8)

## 2024-05-02 LAB — T4, FREE: Free T4: 1.06 ng/dL (ref 0.82–1.77)

## 2024-05-02 LAB — TSH: TSH: 8.58 u[IU]/mL — ABNORMAL HIGH (ref 0.450–4.500)

## 2024-05-02 LAB — HEMOGLOBIN A1C
Est. average glucose Bld gHb Est-mCnc: 103 mg/dL
Hgb A1c MFr Bld: 5.2 % (ref 4.8–5.6)

## 2024-05-02 LAB — IMMATURE CELLS
Bands(Auto) Relative: 1 %
Metamyelocytes: 1 % — ABNORMAL HIGH (ref 0–0)

## 2024-05-02 LAB — BLADDER SCAN AMB NON-IMAGING

## 2024-05-02 MED ORDER — TAMSULOSIN HCL 0.4 MG PO CAPS: 0.4000 mg | ORAL_CAPSULE | Freq: Every day | ORAL | 3 refills | Status: DC

## 2024-05-02 MED ORDER — TESTOSTERONE CYPIONATE 200 MG/ML IM SOLN: 200.0000 mg | INTRAMUSCULAR | 0 refills | Status: DC

## 2024-05-03 ENCOUNTER — Ambulatory Visit: Payer: Self-pay | Admitting: Urology

## 2024-05-03 ENCOUNTER — Ambulatory Visit: Admitting: Oncology

## 2024-05-03 ENCOUNTER — Other Ambulatory Visit

## 2024-05-03 ENCOUNTER — Ambulatory Visit
Admission: RE | Admit: 2024-05-03 | Discharge: 2024-05-03 | Disposition: A | Discharge status: Home / Self Care | Source: Ambulatory Visit | Attending: Oncology | Admitting: Oncology

## 2024-05-03 DIAGNOSIS — C8202 Follicular lymphoma grade I, intrathoracic lymph nodes: Secondary | ICD-10-CM | POA: Diagnosis present

## 2024-05-03 DIAGNOSIS — J90 Pleural effusion, not elsewhere classified: Secondary | ICD-10-CM | POA: Diagnosis not present

## 2024-05-03 DIAGNOSIS — R188 Other ascites: Secondary | ICD-10-CM | POA: Insufficient documentation

## 2024-05-03 DIAGNOSIS — R161 Splenomegaly, not elsewhere classified: Secondary | ICD-10-CM | POA: Diagnosis not present

## 2024-05-03 LAB — GLUCOSE, CAPILLARY: Glucose-Capillary: 117 mg/dL — ABNORMAL HIGH (ref 70–99)

## 2024-05-03 LAB — PSA: Prostate Specific Ag, Serum: 1.4 ng/mL (ref 0.0–4.0)

## 2024-05-03 LAB — TESTOSTERONE: Testosterone: 770 ng/dL (ref 264–916)

## 2024-05-03 MED ORDER — FLUDEOXYGLUCOSE F - 18 (FDG) INJECTION
12.7700 | Freq: Once | INTRAVENOUS | Status: AC | PRN
Start: 2024-05-03 — End: 2024-05-03
  Administered 2024-05-03: 12.77 via INTRAVENOUS

## 2024-05-05 ENCOUNTER — Telehealth: Payer: Self-pay | Admitting: Nurse Practitioner

## 2024-05-05 NOTE — Telephone Encounter (Signed)
 Patient came in with a letter from [facility] pharmacy stating that they would need more information from the provider before they could process the request for Gabapentin . Letter wiill be placed in [name]'s folder. Please follow up with the [name] [phone]

## 2024-05-08 ENCOUNTER — Encounter: Payer: Self-pay | Admitting: Podiatry

## 2024-05-08 ENCOUNTER — Ambulatory Visit (INDEPENDENT_AMBULATORY_CARE_PROVIDER_SITE_OTHER): Admitting: Podiatry

## 2024-05-08 DIAGNOSIS — G629 Polyneuropathy, unspecified: Secondary | ICD-10-CM | POA: Diagnosis not present

## 2024-05-08 NOTE — Patient Instructions (Signed)
 Use capsaicin cream on the feet to see if this can help

## 2024-05-09 NOTE — Progress Notes (Signed)
  Subjective:  Patient ID: Russell Simpson, male    DOB: 01/15/1961,  MRN: [id number]  Chief Complaint  Patient presents with   Peripheral Neuropathy    "I feel like I have putty under my toes.  I had to stop taking chemo because it put me in the hospital."    63 y.o. male presents with the above complaint. History confirmed with patient.  He had stopped chemotherapy due to side effects  Objective:  Physical Exam: warm, good capillary refill, no trophic changes or ulcerative lesions, normal DP and PT pulses, and diffuse plantar forefoot polyneuropathy  Assessment:   1. Neuropathy      Plan:  Patient was evaluated and treated and all questions answered.  We again discussed the issue is having is related to the neuropathy.  Unlikely to change at this point.  He will continue to work on his gabapentin  dosing to see if this alleviates it.  Not much we can offer from a podiatric standpoint for this.  Follow-up as needed.  Return if symptoms worsen or fail to improve.

## 2024-05-15 ENCOUNTER — Other Ambulatory Visit: Payer: Self-pay | Admitting: Nurse Practitioner

## 2024-05-15 NOTE — Telephone Encounter (Unsigned)
 Copied from CRM [id number]. Topic: Clinical - Medication Refill >> May 15, 2024  1:48 PM Yolanda T wrote: Medication:  gabapentin  (NEURONTIN ) 100 MG capsule   Has the patient contacted their pharmacy? Yes  [facility] - [place], [place] - [address] [place] [address] Phone: [phone] Fax: [phone]  Is this the correct pharmacy for this prescription? Yes  Has the prescription been filled recently? Yes  Is the patient out of the medication? Yes  Has the patient been seen for an appointment in the last year OR does the patient have an upcoming appointment? Yes  Can we respond through MyChart? No  Agent: Please be advised that Rx refills may take up to 3 business days. We ask that you follow-up with your pharmacy.

## 2024-05-16 ENCOUNTER — Other Ambulatory Visit: Payer: Self-pay | Admitting: Interventional Radiology

## 2024-05-16 ENCOUNTER — Inpatient Hospital Stay: Admitting: Pharmacist

## 2024-05-16 ENCOUNTER — Telehealth: Payer: Self-pay | Admitting: Pharmacy Technician

## 2024-05-16 ENCOUNTER — Telehealth: Payer: Self-pay

## 2024-05-16 ENCOUNTER — Other Ambulatory Visit (HOSPITAL_COMMUNITY): Payer: Self-pay

## 2024-05-16 ENCOUNTER — Encounter: Payer: Self-pay | Admitting: Oncology

## 2024-05-16 ENCOUNTER — Inpatient Hospital Stay: Attending: Oncology | Admitting: Oncology

## 2024-05-16 VITALS — BP 115/63 | HR 104 | Temp 98.5°F | Resp 19 | Ht 74.0 in | Wt 244.7 lb

## 2024-05-16 DIAGNOSIS — R590 Localized enlarged lymph nodes: Secondary | ICD-10-CM | POA: Diagnosis not present

## 2024-05-16 DIAGNOSIS — C8202 Follicular lymphoma grade I, intrathoracic lymph nodes: Secondary | ICD-10-CM | POA: Diagnosis present

## 2024-05-16 DIAGNOSIS — R161 Splenomegaly, not elsewhere classified: Secondary | ICD-10-CM | POA: Diagnosis not present

## 2024-05-16 MED ORDER — BRUKINSA 80 MG PO CAPS
320.0000 mg | ORAL_CAPSULE | Freq: Every day | ORAL | 1 refills | Status: DC
  Filled 2024-05-19: qty 120, 30d supply, fill #0

## 2024-05-16 MED ORDER — ALLOPURINOL 300 MG PO TABS: 300.0000 mg | ORAL_TABLET | Freq: Every day | ORAL | 3 refills | Status: DC

## 2024-05-16 NOTE — Progress Notes (Signed)
 Clinical Pharmacist Practitioner Clinic [facility]  [phone] [phone]  Patient Care Team: Aileen Alexanders, NP as PCP - General (Nurse Practitioner) Avonne Boettcher, MD as Consulting Physician (Oncology) Marc Senior, MD as Consulting Physician (Pulmonary Disease) Harris Liming, MD as Referring Physician (Dermatology) Shayne Demark, MD as Referring Physician Oda Bence PA-C as Physician Assistant (Urology) Selena Daily, MD as Consulting Physician (Gastroenterology)   Name of the patient: Russell Simpson  [id number]  [birth year]   Date of visit: 05/16/24  HPI: Patient is a 63 y.o. male with recurrent follicular lymphoma. Planned treatment with zanubrutinib, patient declined the additional of IV obinutuzumab to his treatment regimen.   Reason for Consult: Zanubrutinib oral chemotherapy education.   PAST MEDICAL HISTORY: 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HCC)    Arthritis    Asthma    Basal cell carcinoma 01/28/2021   R upper arm, EDC 03/04/2021   Basal cell carcinoma 12/31/2022   Right temporal hair line. Nodular. 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)   04/04/2021 Cancer Staging   Staging form: Hodgkin and Non-Hodgkin Lymphoma, AJCC 8th Edition - Clinical stage from 04/04/2021: Stage IV (Follicular lymphoma) - Signed by Avonne Boettcher, MD on 04/04/2021 Stage prefix: Initial diagnosis   04/08/2021 -  09/11/2021 Chemotherapy   Patient is on Treatment Plan : NON-HODGKINS LYMPHOMA Rituximab  D1 + Bendamustine  D1,2 q28d x 6 cycles     12/27/2023 - 01/05/2024 Chemotherapy   Patient is on Treatment Plan : LYMPHOMA Lenalidomide  + Rituximab  q28d       ALLERGIES:  is allergic to penicillins; tetanus toxoid; tetanus toxoid, adsorbed; tetanus toxoids; lisinopril; losartan; codeine; doxycycline ; and ruxience  [rituximab -pvvr].  MEDICATIONS:  Current Outpatient Medications   Medication Sig Dispense Refill   albuterol  (VENTOLIN  HFA) 108 (90 Base) MCG/ACT inhaler Inhale 2 puffs into the lungs every 6 (six) hours as needed for wheezing or shortness of breath. 18 g 6   allopurinol  (ZYLOPRIM ) 300 MG tablet Take 1 tablet (300 mg total) by mouth daily. 30 tablet 3   atorvastatin  (LIPITOR ) 80 MG tablet Take 1 tablet (80 mg total) by mouth daily. 90 tablet 3   buPROPion  (WELLBUTRIN  XL) 300 MG 24 hr tablet Take 300 mg by mouth daily.     cetirizine  (ZYRTEC ) 10 MG tablet Take 1 tablet (10 mg total) by mouth daily. 30 tablet 11   clonazePAM  (KLONOPIN ) 0.5 MG tablet Take 0.5 mg by mouth every evening.     cyclobenzaprine  (FLEXERIL ) 10 MG tablet Take 10 mg by mouth 3 (three) times daily.     DULoxetine  (CYMBALTA ) 60 MG capsule Take 60 mg by mouth every evening.     famotidine  (PEPCID ) 20 MG tablet TAKE 1 TABLET BY MOUTH DAILY. START TAKING 2 DAYS PRIOR TO CHEMO, DONT TAKE DAY OF CHEMO 60 tablet 0   finasteride  (PROSCAR ) 5 MG tablet Take 1 tablet (5 mg total) by mouth daily. 90 tablet 1   fluorouracil  (EFUDEX ) 5 % cream Apply topically 2 (two) times daily. Apply to aa's R arm, R lower leg, and R neck BID until reaction occurs. 30 g 1   fluticasone  (FLONASE ) 50 MCG/ACT nasal spray PLACE 2 SPRAYS INTO BOTH NOSTRIL ONCE DAILY 16 g 1   gabapentin  (NEURONTIN ) 100 MG capsule TAKE 1 CAPSULE BY MOUTH AT BEDTIME 90 capsule 0   Ivermectin 6 MG TABS Take 1 tablet by mouth daily.     metoprolol  succinate (TOPROL -XL) 100 MG 24 hr tablet TAKE 1 TABLET BY MOUTH ONCE DAILY WITH OR IMMEDIATELY FOLLOWING A MEAL 90 tablet 0   mirtazapine  (REMERON ) 15 MG tablet Take 15 mg by mouth at bedtime.     montelukast  (SINGULAIR ) 10 MG tablet TAKE 1 TABLET EVERY DAY 90 tablet 1   naltrexone  (DEPADE) 50 MG tablet Take 50 mg by mouth daily.     ondansetron  (ZOFRAN ) 4 MG tablet Take 1 tablet (4 mg total) by mouth every 8 (eight) hours as needed for nausea or vomiting. 30 tablet 1   sildenafil  (VIAGRA ) 100 MG  tablet Take 1 tablet (100 mg total) by mouth daily as needed for erectile dysfunction. Take two hours prior to intercourse on an empty stomach 30 tablet 3   tamsulosin  (FLOMAX ) 0.4 MG CAPS capsule Take 1 capsule (0.4 mg total) by mouth daily. 90 capsule 3   tenofovir  alafenamide (VEMLIDY ) 25 MG tablet Take 1 tablet (25 mg total) by mouth daily. 30 tablet 1   testosterone  cypionate (DEPOTESTOSTERONE CYPIONATE) 200 MG/ML injection Inject 1 mL (200 mg total) into the muscle every 14 (fourteen) days. INJECT 1ML INTRAMUSCULARLY EVERY 14 DAYS 10 mL 0   No current facility-administered medications for this visit.   Facility-Administered Medications Ordered in Other Visits  Medication Dose Route Frequency Provider Last Rate Last Admin   heparin   lock flush 100 unit/mL  500 Units Intravenous Once Rao, Archana C, MD       heparin  lock flush 100 unit/mL  500 Units Intravenous Once Rao, Archana C, MD       sodium chloride  flush (NS) 0.9 % injection 10 mL  10 mL Intravenous PRN Rao, Archana C, MD        VITAL SIGNS: There were no vitals taken for this visit. There were no vitals filed for this visit.  Estimated body mass index is 31.42 kg/m as calculated from the following:   Height as of an earlier encounter on 05/16/24: 6' 2 (1.88 m).   Weight as of an earlier encounter on 05/16/24: 111 kg (244 lb 11.2 oz).  LABS: CBC:    Component Value Date/Time   WBC 8.0 05/01/2024 1505   WBC 8.2 04/25/2024 1521   WBC 10.3 01/31/2024 1523   HGB 13.5 05/01/2024 1505   HCT 40.5 05/01/2024 1505   PLT 124 (L) 05/01/2024 1505   MCV 91 05/01/2024 1505   MCV 96 07/19/2013 0548   NEUTROABS 3.1 05/01/2024 1505   NEUTROABS 3.8 07/19/2013 0548   LYMPHSABS 3.3 (H) 05/01/2024 1505   LYMPHSABS 2.4 07/19/2013 0548   MONOABS 1.2 (H) 04/25/2024 1521   MONOABS 0.7 07/19/2013 0548   EOSABS 0.6 (H) 05/01/2024 1505   EOSABS 0.0 07/19/2013 0548   BASOSABS 0.1 05/01/2024 1505   BASOSABS 0.0 07/19/2013 0548    Comprehensive Metabolic Panel:    Component Value Date/Time   NA 132 (L) 05/01/2024 1505   NA 139 07/19/2013 0548   K 4.2 05/01/2024 1505   K 3.7 07/19/2013 0548   CL 96 05/01/2024 1505   CL 105 07/19/2013 0548   CO2 21 05/01/2024 1505   CO2 24 07/19/2013 0548   BUN 10 05/01/2024 1505   BUN 12 07/19/2013 0548   CREATININE 1.29 (H) 05/01/2024 1505   CREATININE 1.26 (H) 04/25/2024 1521   CREATININE 1.14 10/15/2017 0906   GLUCOSE 153 (H) 05/01/2024 1505   GLUCOSE 79 04/25/2024 1521   GLUCOSE 98 07/19/2013 0548   CALCIUM  10.3 (H) 05/01/2024 1505   CALCIUM  8.8 07/19/2013 0548   AST 21 05/01/2024 1505   AST 20 04/25/2024 1521   ALT 10 05/01/2024 1505   ALT 13 04/25/2024 1521   ALT 39 07/19/2013 0548   ALKPHOS 166 (H) 05/01/2024 1505   ALKPHOS 87 07/19/2013 0548   BILITOT 0.5 05/01/2024 1505   BILITOT 0.9 04/25/2024 1521   PROT 5.4 (L) 05/01/2024 1505   PROT 7.4 07/19/2013 0548   ALBUMIN 4.0 05/01/2024 1505   ALBUMIN 4.2 07/19/2013 0548     Present during today's visit: Patient only  Start plan: Patient will start when he has medication in hand   Patient Education I spoke with patient for overview of new oral chemotherapy medication: zanubrutinib   CMP from 05/01/24 assessed, no relevant lab abnormalities. Prescription dose and frequency assessed.    Administration: Counseled patient on administration, dosing, side effects, monitoring, drug-food interactions, safe handling, storage, and disposal. Patient will take 4 tablets (320mg  total) by mouth once daily.  Patient will also start allopurinol  as prophylaxis, MD sent rx to local pharmacy for patient.   Side Effects: Side effects include but not limited to: decreased wbc/hgb/plt, fatigue, rash.    Drug-drug Interactions (DDI): Duloxetine : Zanubrutinib may increase antiplatelet effects of Agents with Antiplatelet Effects, like duloxetine . Monitor for signs and symptoms of bleeding. No baseline dose adjustment  needed.  Adherence: After  discussion with patient no patient barriers to medication adherence identified.  Reviewed with patient importance of keeping a medication schedule and plan for any missed doses.  Mr. Waldman voiced understanding and appreciation. All questions answered. Medication handout provided.  Provided patient with Oral Chemotherapy Navigation Clinic phone number. Patient knows to call the office with questions or concerns. Oral Chemotherapy Navigation Clinic will continue to follow.  Patient expressed understanding and was in agreement with this plan. He also understands that He can call clinic at any time with any questions, concerns, or complaints.   Medication Access Issues: PA pending  Follow-up plan: RTC as scheduled  Thank you for allowing me to participate in the care of this patient.   Time Total: 20 mins  Visit consisted of counseling and education on dealing with issues of symptom management in the setting of serious and potentially life-threatening illness.Greater than 50%  of this time was spent counseling and coordinating care related to the above assessment and plan.  Signed by: Filipe Greathouse N. Aniqua Briere, PharmD, Lorraine Roses, CPP Hematology/Oncology Clinical Pharmacist Practitioner [place] [phone]  05/16/2024 3:41 PM

## 2024-05-16 NOTE — Telephone Encounter (Signed)
 Outbound call to patient to schedule CT abdominal mass biopsy [id number]; per Jannie Menghini next availability is 8:30 Monday the 23rd, arrive at 7:30, NPO after midnight, driver post procedure.  Voice message left.

## 2024-05-16 NOTE — Telephone Encounter (Signed)
 Oral Oncology Patient Advocate Encounter   Received notification that prior authorization for Brukinsa is required.   PA submitted on 05/16/2024 Key [id number] Status is pending     Khamari Yousuf (Patty) Benjaman Branch, CPhT  [facility] [place], [place], [place], [place] Oral Chemotherapy Patient Advocate Phone: [phone]  Fax: [phone]

## 2024-05-16 NOTE — Progress Notes (Unsigned)
 Patient has no new or acute concerns today.

## 2024-05-16 NOTE — Progress Notes (Unsigned)
 Outbound call to patient to schedule CT abdominal mass biopsy [id number]; per Jannie Menghini next availability is 8:30 Monday the 23rd, arrive at 7:30, NPO after midnight, driver post procedure.  Voice message left.

## 2024-05-17 ENCOUNTER — Other Ambulatory Visit (HOSPITAL_COMMUNITY): Payer: Self-pay

## 2024-05-17 ENCOUNTER — Other Ambulatory Visit: Payer: Self-pay | Admitting: Oncology

## 2024-05-17 DIAGNOSIS — C8202 Follicular lymphoma grade I, intrathoracic lymph nodes: Secondary | ICD-10-CM

## 2024-05-17 MED ORDER — GABAPENTIN 100 MG PO CAPS: 100.0000 mg | ORAL_CAPSULE | Freq: Every day | ORAL | 0 refills | Status: DC

## 2024-05-17 NOTE — Telephone Encounter (Signed)
 Requested Prescriptions  Pending Prescriptions Disp Refills   gabapentin  (NEURONTIN ) 100 MG capsule 90 capsule 0    Sig: Take 1 capsule (100 mg total) by mouth at bedtime.     Neurology: Anticonvulsants - gabapentin  Failed - 05/17/2024  2:41 PM      Failed - Cr in normal range and within 360 days    Creatinine  Date Value Ref Range Status  04/25/2024 1.26 (H) 0.61 - 1.24 mg/dL Final   Creat  Date Value Ref Range Status  10/15/2017 1.14 0.70 - 1.33 mg/dL Final    Comment:    For patients >66 years of age, the reference limit for Creatinine is approximately 13% higher for people identified as African-American. .    Creatinine, Ser  Date Value Ref Range Status  05/01/2024 1.29 (H) 0.76 - 1.27 mg/dL Final         Passed - Completed PHQ-2 or PHQ-9 in the last 360 days      Passed - Valid encounter within last 12 months    Recent Outpatient Visits           2 weeks ago Stage 2 moderate COPD by GOLD classification [facility])   [place] [facility] Aileen Alexanders, NP   3 months ago Annual physical exam   [place] [facility] Aileen Alexanders, NP       Future Appointments             In 1 week Harris Liming, MD [facility]   In 2 months [place], Nyra Bellis [facility] [place]   In 3 months Harris Liming, MD [facility]

## 2024-05-17 NOTE — Telephone Encounter (Signed)
 Oral Oncology Patient Advocate Encounter  Prior Authorization for Brukinsa has been approved.    PA# [id number] Effective dates: 12/01/2023 through 11/29/2024  Patients co-pay is $0.    Roca (Patty) Benjaman Branch, CPhT  [facility], [place], [place], [place] Oral Chemotherapy Patient Advocate Phone: [phone]  Fax: [phone]

## 2024-05-19 ENCOUNTER — Other Ambulatory Visit: Payer: Self-pay | Admitting: Pharmacy Technician

## 2024-05-19 ENCOUNTER — Other Ambulatory Visit: Payer: Self-pay | Admitting: Interventional Radiology

## 2024-05-19 ENCOUNTER — Other Ambulatory Visit: Payer: Self-pay

## 2024-05-19 DIAGNOSIS — Z01818 Encounter for other preprocedural examination: Secondary | ICD-10-CM

## 2024-05-19 NOTE — Telephone Encounter (Signed)
 Received voice message from patient today 6/20 at 11:17am agreeing to the CT abdominal mass biopsy on 6/23 appointment time 8:30am, arrival time 7:30am.  Outbound call to patient; reviewed w/ patient instructions notated below and clarified it is indeed a CT abdominal mass biopsy being performed.  While on call patient reported his lower back is killing him and is having difficulty ambulating to his vehicle. Advised would touch base with Dr. Randy Buttery and see if we can prescribe him something for pain; if agreed upon, patient requested medication to be sent to [name].

## 2024-05-19 NOTE — Progress Notes (Signed)
 Specialty Pharmacy Initial Fill Coordination Note  Russell Simpson is a 63 y.o. male contacted today regarding refills of specialty medication(s) Zanubrutinib (Brukinsa) .  Patient requested Delivery  on 05/23/24  to verified address [address]  [place] [address]   Medication will be filled on 05/19/2024.   Patient is aware of $0 copayment.  Kirill Chatterjee (Patty) Benjaman Branch, CPhT  [facility], [place], [place], [place] Oral Chemotherapy Patient Advocate Phone: [phone]  Fax: [phone]

## 2024-05-19 NOTE — Telephone Encounter (Signed)
 Outbound call to patient; advised to take PO Tylenol  OTC as needed for pain, said that he has tried that.  Reviewed with patient to have biopsy as planned and Dr. Randy Buttery would touch base after and reevaluate if needed.  Patient verbalized understanding.

## 2024-05-19 NOTE — Progress Notes (Signed)
 Patient education documented in EPIC note on 05/16/24.

## 2024-05-21 NOTE — Progress Notes (Signed)
 Hematology/Oncology Consult note [facility]  Telephone:([phone] [phone]  Patient Care Team: Melvin Pao, NP as PCP - General (Nurse Practitioner) Melanee Annah BROCKS, MD as Consulting Physician (Oncology) Tamea Dedra CROME, MD as Consulting Physician (Pulmonary Disease) Claudene Lehmann, MD as Referring Physician (Dermatology) Trudy Dorn BRAVO, MD as Referring Physician Helon Clotilda LABOR PA-C as Physician Assistant (Urology) Unk Corinn Skiff, MD as Consulting Physician (Gastroenterology)   Name of the patient: Russell Simpson  [id number]  1961/09/15   Date of visit: 05/21/24  Diagnosis- recurrent follicular lymphoma  Chief complaint/ Reason for visit- discuss pet scan results and further management  Heme/Onc history: patient is a 63 year old male with a past medical history significant for hypertension, hyperlipidemia, hypogonadism, pituitary adenoma who recently had a fall on in December 2020. SABRA  He sustained a left anterior frontal sinus as well as supraorbital rim fracture on 11/21/2019 which was managed conservatively.  He then presented to the ER at [facility] with symptoms of right-sided chest wall pain this led to a CT PE which did not show any evidence of pulmonary embolism.  He was noted to have bilateral symmetric axillary adenopathy measuring up to 1.8 cm.  Scattered mediastinal adenopathy.  Right paratracheal node measuring up to 1.2 cm.  Prominent right costophrenic lymph node measuring up to 1 cm.  Findings are nonspecific but could be seen with lymphoma versus systemic inflammatory disease such as sarcoidosis.  Of note patient has had a prior CT scan for lung screening back in 2015 when he was not noted to have this adenopathy.    Repeat CT chest in August 2021 showed persistent mild nonbulky axillary and mediastinal adenopathy which had not changed significantly as compared to his prior CT in December 2020.  Core biopsy of the  axillary lymph nodes was consistent with low-grade follicular lymphoma grade 1 to 2.   Repeat CT in March 2022 showed Progression and multistation adenopathy as well as significant splenomegaly measuring 20.4 cm as compared to 14.5 cm on CT scan September 2021.  CT scan showed multistation adenopathy with SUVs ranging between 6-8.9.  Patient underwent another core biopsy of right inguinal lymph node which was consistent with follicular lymphoma grade 1-2.  Bone marrow biopsy also showed moderate involvement with non-Hodgkin's B-cell lymphoma.  Baseline hepatitis B testing showed core antibody positivity.  Seen by GI and started on tenofovir    Patient had symptoms of nausea and sweating during cycle 1 of Rituxan  which resolved with additional premedications.  He developed symptoms of itching over neck chest back and bilateral eyes with second dose of Rituxan  early and had to get more premedications and was not rechallenged on the same day.  Patient subsequently received cycle 3 of Bendamustine  Rituxan  chemotherapy after Rituxan  was given as a split dose over 3 days   Scans after 6 cycles of Bendamustine  Rituxan  chemotherapy in October 2022 showed significant improvement with therapy.  Lymphadenopathy in his neck and mediastinum resolved Deauville 2.  Decrease in the splenic size and hypermetabolism.  No new lesions.  FDG accumulation within the skeleton presumably due to stimulatory effects of treatment.   Scans in April 2023 showed pulmonary nodules requiring further assessment and patient was seen by pulmonary.  He also had a PET CT scan in October 2023 which showed gradual enlargement and hypermetabolic some noted in the lung nodules.  Patient also has intra-abdominal lymph nodes ranging from 9 to 1.2 cm with an SUV less than 3.  Patient  had right lower lobe lung biopsy which was negative for malignancy.   Due to continued increase in the size of his lymph nodes as well as splenomegaly with a spleen size  of 20 cm plan was to offer second line treatment with Rituxan  and Revlimid .  Patient had significant skin rash with Revlimid  and subsequently decided to discontinue treatment altogether in February 2025  Interval history-patient reports some ongoing back pain.  Appetite and weight have remained stable.  He is drinking alcohol daily.  ECOG PS- 1 Pain scale- 0  Review of systems- Review of Systems  Constitutional:  Negative for chills, fever, malaise/fatigue and weight loss.  HENT:  Negative for congestion, ear discharge and nosebleeds.   Eyes:  Negative for blurred vision.  Respiratory:  Negative for cough, hemoptysis, sputum production, shortness of breath and wheezing.   Cardiovascular:  Negative for chest pain, palpitations, orthopnea and claudication.  Gastrointestinal:  Negative for abdominal pain, blood in stool, constipation, diarrhea, heartburn, melena, nausea and vomiting.  Genitourinary:  Negative for dysuria, flank pain, frequency, hematuria and urgency.  Musculoskeletal:  Positive for back pain. Negative for joint pain and myalgias.  Skin:  Negative for rash.  Neurological:  Negative for dizziness, tingling, focal weakness, seizures, weakness and headaches.  Endo/Heme/Allergies:  Does not bruise/bleed easily.  Psychiatric/Behavioral:  Negative for depression and suicidal ideas. The patient does not have insomnia.       Allergies  Allergen Reactions   Penicillins Anaphylaxis    Tolerates cefdinir , cephalexin and amoxicillin/clavulonate so true PCN allergy unlikely   Tetanus Toxoid Swelling   Tetanus Toxoid, Adsorbed Swelling   Tetanus Toxoids Swelling   Lisinopril Cough   Losartan      Other reaction(s): Muscle Pain     Codeine Nausea Only and Nausea And Vomiting   Doxycycline  Rash   Ruxience  [Rituximab -Pvvr] Nausea And Vomiting, Rash and Other (See Comments)    05/06/21 pt w/ worsening rash during Ruxience  infusion.  Face, neck and chest flushing redness  12/27/2023:  flushing, diaphoresis, nausea and vomiting     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HCC)    Arthritis    Asthma    Basal cell carcinoma 01/28/2021   R upper arm, EDC 03/04/2021   Basal cell carcinoma 12/31/2022   Right temporal hair line. Nodular. 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   right upper back ED&C done   Squamous cell carcinoma of skin 02/19/2021   L inferior mandible, treated with EDC   Squamous cell  carcinoma of skin 08/28/2021   R ant shoulder - ED&C   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/28/2021   L upper abdomen - ED&C     Past Surgical History:  Procedure Laterality Date   ANTERIOR CERVICAL DECOMP/DISCECTOMY FUSION N/A 06/17/2016   Procedure: ANTERIOR CERVICAL DECOMPRESSION FUSION, CERVICAL 3-4, CERVICAL 4-5 WITH INSTRUMENTATION AND ALLOGRAFT;  Surgeon: Oneil Priestly, MD;  Location: [facility] OR;  Service: Orthopedics;  Laterality: N/A;  ANTERIOR CERVICAL DECOMPRESSION FUSION, CERVICAL 3-4, CERVICAL 4-5 WITH INSTRUMENTATION AND ALLOGRAFT   BACK SURGERY     x3   COLONOSCOPY WITH PROPOFOL  N/A 12/10/2023   Procedure: COLONOSCOPY WITH PROPOFOL ;  Surgeon: Unk Corinn Skiff, MD;  Location: [facility] ENDOSCOPY;  Service: Gastroenterology;  Laterality: N/A;   HARDWARE REMOVAL Right 05/04/2023   Procedure: HARDWARE REMOVAL;  Surgeon: Marchia Drivers, MD;  Location: [facility] ORS;  Service: Orthopedics;  Laterality: Right;   HEMOSTASIS CLIP PLACEMENT  12/10/2023   Procedure: HEMOSTASIS CLIP PLACEMENT;  Surgeon: Unk Corinn Skiff, MD;  Location: [facility] ENDOSCOPY;  Service: Gastroenterology;;   HEMOSTASIS CONTROL  12/10/2023   Procedure: HEMOSTASIS CONTROL;  Surgeon: Unk Corinn Skiff, MD;  Location: [facility] ENDOSCOPY;  Service: Gastroenterology;;   NECK SURGERY  12/01/2007   ORIF ANKLE FRACTURE Right 12/02/2022   Procedure: OPEN REDUCTION INTERNAL FIXATION (ORIF) ANKLE FRACTURE;  Surgeon: Marchia Drivers, MD;  Location: [facility] ORS;  Service: Orthopedics;  Laterality: Right;   ORIF ANKLE FRACTURE Right 12/01/2022   Procedure: OPEN REDUCTION INTERNAL FIXATION (ORIF) ANKLE FRACTURE;  Surgeon: Marchia Drivers, MD;  Location: [facility] ORS;  Service: Orthopedics;  Laterality: Right;   POLYPECTOMY  12/10/2023   Procedure: POLYPECTOMY;  Surgeon: Unk Corinn Skiff, MD;  Location: [facility] ENDOSCOPY;  Service: Gastroenterology;;   PORTA CATH INSERTION N/A 04/03/2021   Procedure: PORTA CATH INSERTION;  Surgeon: Marea Selinda RAMAN, MD;   Location: [facility] INVASIVE CV LAB;  Service: Cardiovascular;  Laterality: N/A;   SUBMUCOSAL LIFTING INJECTION  12/10/2023   Procedure: SUBMUCOSAL LIFTING INJECTION;  Surgeon: Unk Corinn Skiff, MD;  Location: [facility] ENDOSCOPY;  Service: Gastroenterology;;   SUBMUCOSAL TATTOO INJECTION  12/10/2023   Procedure: SUBMUCOSAL TATTOO INJECTION;  Surgeon: Unk Corinn Skiff, MD;  [facility]COPY;  Service: Gastroenterology;;   VIDEO BRONCHOSCOPY WITH ENDOBRONCHIAL ULTRASOUND Bilateral 10/14/2022   Procedure: VIDEO BRONCHOSCOPY WITH ENDOBRONCHIAL ULTRASOUND;  Surgeon: Tamea Dedra CROME, MD;  Location: [facility] ORS;  Service: Pulmonary;  Laterality: Bilateral;    Social History   Socioeconomic History   Marital status: Divorced    Spouse name: Not on file   Number of children: 1   Years of education: Not on file   Highest education level: Not on file  Occupational History   Occupation: disability  Tobacco Use   Smoking status: Former    Types: Cigars    Quit  date: 11/30/2022    Years since quitting: 1.4    Passive exposure: Past   Smokeless tobacco: Never  Vaping Use   Vaping status: Former   Start date: 04/30/2017   Quit date: 11/30/2017  Substance and Sexual Activity   Alcohol use: Yes    Alcohol/week: 42.0 standard drinks of alcohol    Types: 42 Cans of beer per week    Comment: beers 6 a day   Drug use: No   Sexual activity: Not Currently  Other Topics Concern   Not on file  Social History Narrative   Not on file   Social Drivers of Health   Financial Resource Strain: Low Risk  (04/18/2024)   Overall Financial Resource Strain (CARDIA)    Difficulty of Paying Living Expenses: Not very hard  Food Insecurity: No Food Insecurity (04/18/2024)   Hunger Vital Sign    Worried About Running Out of Food in the Last Year: Never true    Ran Out of Food in the Last Year: Never true  Transportation Needs: No Transportation Needs (04/18/2024)   PRAPARE - Scientist, research (physical sciences) (Medical): No    Lack of Transportation (Non-Medical): No  Physical Activity: Insufficiently Active (04/18/2024)   Exercise Vital Sign    Days of Exercise per Week: 2 days    Minutes of Exercise per Session: 40 min  Stress: No Stress Concern Present (04/18/2024)   [name] of Occupational Health - Occupational Stress Questionnaire    Feeling of Stress : Not at all  Social Connections: Socially Isolated (04/18/2024)   Social Connection and Isolation Panel    Frequency of Communication with Friends and Family: More than three times a week    Frequency of Social Gatherings with Friends and Family: Twice a week    Attends Religious Services: Never    Database administrator or Organizations: No    Attends Banker Meetings: Never    Marital Status: Divorced  Catering manager Violence: Not At Risk (04/18/2024)   Humiliation, Afraid, Rape, and Kick questionnaire    Fear of Current or Ex-Partner: No    Emotionally Abused: No    Physically Abused: No    Sexually Abused: No    Family History  Problem Relation Age of Onset   Diabetes Mother    Heart attack Father    Emphysema Father    Diabetes Other      Current Outpatient Medications:    albuterol  (VENTOLIN  HFA) 108 (90 Base) MCG/ACT inhaler, Inhale 2 puffs into the lungs every 6 (six) hours as needed for wheezing or shortness of breath., Disp: 18 g, Rfl: 6   allopurinol  (ZYLOPRIM ) 300 MG tablet, Take 1 tablet (300 mg total) by mouth daily., Disp: 30 tablet, Rfl: 3   atorvastatin  (LIPITOR ) 80 MG tablet, Take 1 tablet (80 mg total) by mouth daily., Disp: 90 tablet, Rfl: 3   buPROPion  (WELLBUTRIN  XL) 300 MG 24 hr tablet, Take 300 mg by mouth daily., Disp: , Rfl:    cetirizine  (ZYRTEC ) 10 MG tablet, Take 1 tablet (10 mg total) by mouth daily., Disp: 30 tablet, Rfl: 11   clonazePAM  (KLONOPIN ) 0.5 MG tablet, Take 0.5 mg by mouth every evening., Disp: , Rfl:    cyclobenzaprine  (FLEXERIL ) 10 MG tablet, Take 10  mg by mouth 3 (three) times daily., Disp: , Rfl:    DULoxetine  (CYMBALTA ) 60 MG capsule, Take 60 mg by mouth every evening., Disp: , Rfl:    famotidine  (PEPCID )  20 MG tablet, TAKE 1 TABLET BY MOUTH DAILY. START TAKING 2 DAYS PRIOR TO CHEMO, DONT TAKE DAY OF CHEMO, Disp: 60 tablet, Rfl: 0   finasteride  (PROSCAR ) 5 MG tablet, Take 1 tablet (5 mg total) by mouth daily., Disp: 90 tablet, Rfl: 1   fluorouracil  (EFUDEX ) 5 % cream, Apply topically 2 (two) times daily. Apply to aa's R arm, R lower leg, and R neck BID until reaction occurs., Disp: 30 g, Rfl: 1   fluticasone  (FLONASE ) 50 MCG/ACT nasal spray, PLACE 2 SPRAYS INTO BOTH NOSTRIL ONCE DAILY, Disp: 16 g, Rfl: 1   Ivermectin 6 MG TABS, Take 1 tablet by mouth daily., Disp: , Rfl:    metoprolol  succinate (TOPROL -XL) 100 MG 24 hr tablet, TAKE 1 TABLET BY MOUTH ONCE DAILY WITH OR IMMEDIATELY FOLLOWING A MEAL, Disp: 90 tablet, Rfl: 0   mirtazapine  (REMERON ) 15 MG tablet, Take 15 mg by mouth at bedtime., Disp: , Rfl:    montelukast  (SINGULAIR ) 10 MG tablet, TAKE 1 TABLET EVERY DAY, Disp: 90 tablet, Rfl: 1   naltrexone  (DEPADE) 50 MG tablet, Take 50 mg by mouth daily., Disp: , Rfl:    ondansetron  (ZOFRAN ) 4 MG tablet, Take 1 tablet (4 mg total) by mouth every 8 (eight) hours as needed for nausea or vomiting., Disp: 30 tablet, Rfl: 1   sildenafil  (VIAGRA ) 100 MG tablet, Take 1 tablet (100 mg total) by mouth daily as needed for erectile dysfunction. Take two hours prior to intercourse on an empty stomach, Disp: 30 tablet, Rfl: 3   tamsulosin  (FLOMAX ) 0.4 MG CAPS capsule, Take 1 capsule (0.4 mg total) by mouth daily., Disp: 90 capsule, Rfl: 3   tenofovir  alafenamide (VEMLIDY ) 25 MG tablet, Take 1 tablet (25 mg total) by mouth daily., Disp: 30 tablet, Rfl: 1   testosterone  cypionate (DEPOTESTOSTERONE CYPIONATE) 200 MG/ML injection, Inject 1 mL (200 mg total) into the muscle every 14 (fourteen) days. INJECT 1ML INTRAMUSCULARLY EVERY 14 DAYS, Disp: 10 mL, Rfl: 0    gabapentin  (NEURONTIN ) 100 MG capsule, Take 1 capsule (100 mg total) by mouth at bedtime., Disp: 90 capsule, Rfl: 0   zanubrutinib  (BRUKINSA ) 80 MG capsule, Take 4 capsules (320 mg total) by mouth daily., Disp: 120 capsule, Rfl: 1 No current facility-administered medications for this visit.  Facility-Administered Medications Ordered in Other Visits:    heparin  lock flush 100 unit/mL, 500 Units, Intravenous, Once, Melanee Annah BROCKS, MD   heparin  lock flush 100 unit/mL, 500 Units, Intravenous, Once, Melanee Annah BROCKS, MD   sodium chloride  flush (NS) 0.9 % injection 10 mL, 10 mL, Intravenous, PRN, Melanee Annah BROCKS, MD  Physical exam:  Vitals:   05/16/24 1301  BP: 115/63  Pulse: (!) 104  Resp: 19  Temp: 98.5 F (36.9 C)  TempSrc: Tympanic  SpO2: 96%  Weight: 244 lb 11.2 oz (111 kg)  Height: 6' 2 (1.88 m)   Physical Exam  Cardiovascular:     Rate and Rhythm: Normal rate and regular rhythm.     Heart sounds: Normal heart sounds.  Pulmonary:     Effort: Pulmonary effort is normal.     Breath sounds: Normal breath sounds.  Abdominal:     General: Bowel sounds are normal.   Skin:    General: Skin is warm and dry.   Neurological:     Mental Status: He is alert and oriented to person, place, and time.      I have personally reviewed labs listed below:    Latest Ref Rng &  Units 05/01/2024    3:05 PM  CMP  Glucose 70 - 99 mg/dL 846   BUN 8 - 27 mg/dL 10   Creatinine 9.23 - 1.27 mg/dL 8.70   Sodium 865 - 855 mmol/L 132   Potassium 3.5 - 5.2 mmol/L 4.2   Chloride 96 - 106 mmol/L 96   CO2 20 - 29 mmol/L 21   Calcium  8.6 - 10.2 mg/dL 89.6   Total Protein 6.0 - 8.5 g/dL 5.4   Total Bilirubin 0.0 - 1.2 mg/dL 0.5   Alkaline Phos 44 - 121 IU/L 166   AST 0 - 40 IU/L 21   ALT 0 - 44 IU/L 10       Latest Ref Rng & Units 05/01/2024    3:05 PM  CBC  WBC 3.4 - 10.8 x10E3/uL 8.0   Hemoglobin 13.0 - 17.7 g/dL 86.4   Hematocrit 62.4 - 51.0 % 40.5   Platelets 150 - 450 x10E3/uL 124    I  have personally reviewed Radiology images listed below: No images are attached to the encounter.  NM PET Image Restag (PS) Skull Base To Thigh Result Date: 05/15/2024 CLINICAL DATA:  Subsequent treatment strategy for follicular lymphoma. EXAM: NUCLEAR MEDICINE PET SKULL BASE TO THIGH TECHNIQUE: 12.77 mCi F-18 FDG was injected intravenously. Full-ring PET imaging was performed from the skull base to thigh after the radiotracer. CT data was obtained and used for attenuation correction and anatomic localization. Fasting blood glucose: 117 mg/dl COMPARISON:  PET-CT 90/69/7975. Standard CT scan chest abdomen pelvis 04/20/2024. older examinations as well. FINDINGS: Mediastinal blood pool activity: SUV max 1.7 Liver activity: SUV max 2.2 NECK: There are numerous scattered hypermetabolic nodes throughout the neck. Distribution includes submandibular, posterior triangle or internal jugular regions. Specifically areas will be followed for continuity but overall these appear to be slightly increased compared to the PET-CT 08/30/2023. Left level 2 node which previously measured maximum SUV value of 4.6 and 10 mm in short axis, today is seen with maximum SUV of 5.7 and has short axis on image 22 of 12 mm. Posterior triangle node right side as another example has maximum SUV value today of 5.0 and previously 3.5. Short axis dimension previously in September 2024 within 5 mm and today 9 mm on image 24. Incidental CT findings: Visualized mastoid air cells are clear. There is some mild opacity along left anterior ethmoid air cells. The parotid glands, submandibular glands and thyroid  gland are unremarkable. CHEST: No abnormal uptake along the lung parenchyma. There is extensive abnormal nodes identified however in the axillary regions, hilum or mediastinum. Areas also include chest wall, internal mammary chain and retrocrural. Compared to the prior PET-CT the extent and distribution is slightly increased from previous. Specific  lesions will be followed for continuity. Example high right paratracheal node which previously had a short axis dimension in Apr 18 2518 mm and today measured in the same fashion 21 mm. Image 49. Maximum SUV of this focus previously of 7.0 and today 7.1. The right axillary node which had short axis dimension on the recent CT scan of 17 mm, today measures on image 52 20 mm. Previous maximum SUV of 4.1 and today 7.8. Other nodes in the right axillary region are new compared to the remote PET-CT. Subcarinal node which previously had short axis of 2.3 cm on the recent CT scan and on the prior PET had maximum of 6.8. Today has maximum SUV of 7.2 and short axis dimension on image 64 of 22 mm.  The right pericardial phrenic lymph node which previously had maximum short axis diameter 14 mm and uptake of 3.9 maximum SUV, today has maximum SUV value 5.0 and short axis dimension 14 mm. This is clearly larger than the remote PET-CT scan. Incidental CT findings: Slight increase in tiny pleural effusions, right-greater-than-left. Adjacent bandlike opacities. Breathing motion. Heart is nonenlarged. No pericardial effusion. Coronary artery calcifications are seen. Please correlate for other coronary risk factors. Thoracic aorta has some scattered calcified plaque as well. Right IJ port with tip extending to the SVC brachiocephalic junction. Similar in extent to prior. Normal caliber thoracic esophagus. Previous lung nodules are again noted and unchanged. ABDOMEN/PELVIS: Spleen is enlarged. Longitudinal length approaches 20.5 cm. Maximum SUV of 5.7. Previously maximum SUV 5.6. Physiologic distribution otherwise along the parenchymal organs, bowel and renal collecting systems. Again ascites and mesenteric stranding identified. Similar to the recent CT scan but clearly increased from more remote examinations. Extensive hypermetabolic lymph nodes are again identified. The extent distribution appears slightly increased overall. Areas  include inguinal, iliac, obturator, retroperitoneal, portacaval, mesenteric amongst others. Specific lesions will be followed for continuity. Nodal conglomerate seen surrounding the aorta and IVC on the recent CT scan was measured at 8.5 x 6.1 cm. This measurement would include the vas ule is a. diameter is similar on image 123. On previous PET-CT this area had maximum SUV 7.3. However on that examination the nodes were more individual nodes and less so conglomerate, overall smaller. Today this area has maximum SUV of 9.0. The celiac axis node which on the recent CT scan had maximum short axis diameter of 2.8 cm and on the prior PET-CT with smaller with maximum SUV of 6.1. Today is similar to the recent CT scan has maximum SUV of 6.2. Right external iliac chain node which recently measures 17 mm in short axis on the prior PET-CT had maximum SUV of 7.3 and today 6.9. Other nodes in this location are increased from the prior PET-CT. The left external iliac chain lymph node which previously had short axis dimension 19 mm and on the more remote PET-CT maximum SUV of 6.1, today has maximum SUV of 7.9 and similar in size to the more recent CT scan but clearly had enlarged compared to remote studies. Incidental CT findings: Gallbladder is nondilated. No renal or ureteral stones identified upper pole right-sided renal cyst. Diffuse vascular calcifications. The bowel is nondilated. Scattered stool. Sigmoid colon diverticula. Underdistended urinary bladder. SKELETON: There are some areas of abnormal bony uptake identified including the right clavicle maximum SUV 7.3. Focus within the sternum on image 67 right of midline has maximum SUV of 8.1. These were not seen on the previous examination. Incidental CT findings: Scattered degenerative changes. Fixation hardware along the cervical spine. Old right-sided rib fractures. IMPRESSION: As seen on the recent CT scan there is overall progression of extensive nodal enlargement  uptake. Some areas are similar but overall the disease progressed. There is also significant splenic involvement with splenomegaly up to 20.5 cm and increased uptake. Additional bony areas of uptake now seen involving the sternum and right clavicle. As described recently there is developing ascites and stranding throughout the mesentery. Slight increase in tiny pleural effusions, right-greater-than-left. Deauville category 5 Electronically Signed   By: Ranell Bring M.D.   On: 05/15/2024 12:19     Assessment and plan- Patient is a 63 y.o. male with history of recurrent follicular lymphoma here to discuss PET CT scan results and further management  I have reviewed  PET CT scan images independently and discussed findings with the patient.  PET scan shows concerning findings for recurrent follicular lymphoma.  There has been a gradual increase in the size of lymph nodes involving his neck as well as mediastinal lymph node involvement.  He had scattered intra-abdominal lymph nodes which have now come together to form a matted mass of 8.5 x 6.1 cm around the aorta and the IVC.  Maximum SUV has been noted to be in this area of 9.  Spleen is enlarged to 20.5 cm.  I would like to get CT-guided core biopsy of his retroperitoneal lymph nodes to rule out diffuse large B cell lymphoma transformation and patient is agreeable for the same.  I will see him back after the results of the biopsy are back.  We discussed that if all this turns out to be recurrent follicular lymphoma which is what I suspect ideally would benefit from a second opinion consult at [facility] to see if he would be a candidate for bispecific therapy.  Patient does not wish to go to [facility] at this time.  He also does not wish to try any infusion based treatments.  I am therefore considering third line BTK inhibitor such as Zanubrutinib  for him.  Zanubrutinib  has been studied in phase 2 trial along with Gazyva in the recurrent follicular lymphoma setting.   However since patient does not wish to take any infusion based treatment I will plan to offer him single agent Zanubrutinib320 mg p.o. daily.  Discussed risks and benefits of this medication including all but not limited to nausea vomiting diarrhea low blood counts, cardiac arrhythmias, abnormal LFTs thrombosis and bleeding.  Treatment will be given with a palliative intent.  Patient understands and agrees to proceed as planned.  He has met with oral pharmacy as well who will work on Therapist, occupational.  I am starting him on allopurinol  for prophylaxis as well.   Visit Diagnosis 1. 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 Annah Skene, MD, MPH [facility] at [facility] [phone] 05/21/2024 11:19 AM

## 2024-05-22 ENCOUNTER — Other Ambulatory Visit: Payer: Self-pay

## 2024-05-22 ENCOUNTER — Ambulatory Visit
Admission: RE | Admit: 2024-05-22 | Discharge: 2024-05-22 | Disposition: A | Discharge status: Home / Self Care | Source: Ambulatory Visit | Attending: Oncology | Admitting: Oncology

## 2024-05-22 ENCOUNTER — Emergency Department

## 2024-05-22 ENCOUNTER — Emergency Department
Admission: EM | Admit: 2024-05-22 | Discharge: 2024-05-22 | Disposition: A | Discharge status: Home / Self Care | Attending: Emergency Medicine | Admitting: Emergency Medicine

## 2024-05-22 DIAGNOSIS — Z01818 Encounter for other preprocedural examination: Secondary | ICD-10-CM | POA: Insufficient documentation

## 2024-05-22 DIAGNOSIS — J449 Chronic obstructive pulmonary disease, unspecified: Secondary | ICD-10-CM | POA: Diagnosis not present

## 2024-05-22 DIAGNOSIS — Z91148 Patient's other noncompliance with medication regimen for other reason: Secondary | ICD-10-CM | POA: Insufficient documentation

## 2024-05-22 DIAGNOSIS — C8202 Follicular lymphoma grade I, intrathoracic lymph nodes: Secondary | ICD-10-CM

## 2024-05-22 DIAGNOSIS — R0602 Shortness of breath: Secondary | ICD-10-CM | POA: Insufficient documentation

## 2024-05-22 DIAGNOSIS — G4730 Sleep apnea, unspecified: Secondary | ICD-10-CM | POA: Diagnosis not present

## 2024-05-22 DIAGNOSIS — R000 Tachycardia, unspecified: Secondary | ICD-10-CM | POA: Insufficient documentation

## 2024-05-22 DIAGNOSIS — I10 Essential (primary) hypertension: Secondary | ICD-10-CM | POA: Diagnosis not present

## 2024-05-22 DIAGNOSIS — C8290 Follicular lymphoma, unspecified, unspecified site: Secondary | ICD-10-CM | POA: Insufficient documentation

## 2024-05-22 LAB — COMPREHENSIVE METABOLIC PANEL WITH GFR
ALT: 15 U/L (ref 0–44)
AST: 34 U/L (ref 15–41)
Albumin: 3.3 g/dL — ABNORMAL LOW (ref 3.5–5.0)
Alkaline Phosphatase: 117 U/L (ref 38–126)
Anion gap: 9 (ref 5–15)
BUN: 15 mg/dL (ref 8–23)
CO2: 23 mmol/L (ref 22–32)
Calcium: 13.4 mg/dL (ref 8.9–10.3)
Chloride: 99 mmol/L (ref 98–111)
Creatinine, Ser: 1.5 mg/dL — ABNORMAL HIGH (ref 0.61–1.24)
GFR, Estimated: 53 mL/min — ABNORMAL LOW (ref >60)
Glucose, Bld: 92 mg/dL (ref 70–99)
Potassium: 4.2 mmol/L (ref 3.5–5.1)
Sodium: 131 mmol/L — ABNORMAL LOW (ref 135–145)
Total Bilirubin: 1 mg/dL (ref 0.0–1.2)
Total Protein: 5.3 g/dL — ABNORMAL LOW (ref 6.5–8.1)

## 2024-05-22 LAB — TROPONIN I (HIGH SENSITIVITY): Troponin I (High Sensitivity): 11 ng/L (ref <18)

## 2024-05-22 LAB — CBC WITH DIFFERENTIAL/PLATELET
Abs Immature Granulocytes: 0.41 10*3/uL — ABNORMAL HIGH (ref 0.00–0.07)
Basophils Absolute: 0.1 10*3/uL (ref 0.0–0.1)
Basophils Relative: 1 %
Eosinophils Absolute: 0.2 10*3/uL (ref 0.0–0.5)
Eosinophils Relative: 2 %
HCT: 37.6 % — ABNORMAL LOW (ref 39.0–52.0)
Hemoglobin: 12.3 g/dL — ABNORMAL LOW (ref 13.0–17.0)
Immature Granulocytes: 5 %
Lymphocytes Relative: 40 %
Lymphs Abs: 3.7 10*3/uL (ref 0.7–4.0)
MCH: 29.8 pg (ref 26.0–34.0)
MCHC: 32.7 g/dL (ref 30.0–36.0)
MCV: 91 fL (ref 80.0–100.0)
Monocytes Absolute: 1.4 10*3/uL — ABNORMAL HIGH (ref 0.1–1.0)
Monocytes Relative: 15 %
Neutro Abs: 3.4 10*3/uL (ref 1.7–7.7)
Neutrophils Relative %: 37 %
Platelets: 112 10*3/uL — ABNORMAL LOW (ref 150–400)
RBC: 4.13 MIL/uL — ABNORMAL LOW (ref 4.22–5.81)
RDW: 13.5 % (ref 11.5–15.5)
WBC: 9.1 10*3/uL (ref 4.0–10.5)
nRBC: 0 % (ref 0.0–0.2)

## 2024-05-22 LAB — CBC
HCT: 39.3 % (ref 39.0–52.0)
Hemoglobin: 13 g/dL (ref 13.0–17.0)
MCH: 30.2 pg (ref 26.0–34.0)
MCHC: 33.1 g/dL (ref 30.0–36.0)
MCV: 91.2 fL (ref 80.0–100.0)
Platelets: 119 10*3/uL — ABNORMAL LOW (ref 150–400)
RBC: 4.31 MIL/uL (ref 4.22–5.81)
RDW: 13.6 % (ref 11.5–15.5)
WBC: 9.8 10*3/uL (ref 4.0–10.5)
nRBC: 0 % (ref 0.0–0.2)

## 2024-05-22 LAB — BRAIN NATRIURETIC PEPTIDE: B Natriuretic Peptide: 20.5 pg/mL (ref 0.0–100.0)

## 2024-05-22 LAB — PROTIME-INR
INR: 1 (ref 0.8–1.2)
Prothrombin Time: 13.8 s (ref 11.4–15.2)

## 2024-05-22 MED ORDER — MIDAZOLAM HCL 2 MG/2ML IJ SOLN
INTRAMUSCULAR | Status: AC
  Filled 2024-05-22: qty 4

## 2024-05-22 MED ORDER — FENTANYL CITRATE (PF) 100 MCG/2ML IJ SOLN
INTRAMUSCULAR | Status: AC
  Filled 2024-05-22: qty 4

## 2024-05-22 MED ORDER — METOPROLOL TARTRATE 50 MG PO TABS
50.0000 mg | ORAL_TABLET | Freq: Once | ORAL | Status: AC
  Administered 2024-05-22: 50 mg via ORAL
  Filled 2024-05-22: qty 1

## 2024-05-22 MED ORDER — SODIUM CHLORIDE 0.9 % IV SOLN
60.0000 mg | Freq: Once | INTRAVENOUS | Status: DC
  Filled 2024-05-22: qty 20

## 2024-05-22 MED ORDER — METOPROLOL TARTRATE 5 MG/5ML IV SOLN
INTRAVENOUS | Status: AC
  Filled 2024-05-22: qty 5

## 2024-05-22 MED ORDER — SODIUM CHLORIDE 0.9 % IV SOLN: INTRAVENOUS | Status: DC

## 2024-05-22 MED ORDER — METOPROLOL TARTRATE 5 MG/5ML IV SOLN
5.0000 mg | INTRAVENOUS | Status: DC | PRN
  Administered 2024-05-22: 5 mg via INTRAVENOUS
  Filled 2024-05-22: qty 5

## 2024-05-22 MED ORDER — SODIUM CHLORIDE 0.9 % IV BOLUS
1000.0000 mL | Freq: Once | INTRAVENOUS | Status: AC
  Administered 2024-05-22: 1000 mL via INTRAVENOUS

## 2024-05-23 ENCOUNTER — Telehealth: Payer: Self-pay | Admitting: *Deleted

## 2024-05-23 NOTE — Telephone Encounter (Signed)
 Received call from Inocente Clarke that patient had passed away. Requested Dr Melanee be notified.

## 2024-05-24 ENCOUNTER — Other Ambulatory Visit: Payer: Self-pay

## 2024-05-29 ENCOUNTER — Ambulatory Visit: Admitting: Dermatology

## 2024-05-30 NOTE — ED Triage Notes (Signed)
 Pt to ED from specials for c/o HR in 140s. Pt states he has not slept for days. Pt has not taken home meds for 8 weeks, taken off home meds by holistic doctor. Pt was to have an abd mass biopsy today. Pt given metoprolol , 5 mg IV.

## 2024-05-30 NOTE — ED Notes (Signed)
Informed RN bed assigned 

## 2024-05-30 NOTE — Progress Notes (Signed)
 [facility] Liaison   New referral received for hospice at home.  Patient refusing care in the ED and wants to be discharged with hospice- per epic chat.  Dr. Melanee wants to see her patient, but unable to see until this afternoon. Josh Borders was on his way to see patient in ED when we received a message from Devetta McClain at 1220p via  epic chat that patient signed out AMA.    Dr. Melanee did not want to sign hospice orders as attending without seeing or speaking with patient.  Dr. Melanee perks palliative care consult until she was able to talk with patient.  Referral for outpatient palliative with [facility] sent.  Thank you for allowing participation in this patient's care.  I called and left a message on patients phone with no returned call.  Saddie HILARIO Na RN, [facility] [phone]

## 2024-05-30 NOTE — H&P (Signed)
 History & Physical   Patient: Russell Simpson FMW:[id number] DOB: 1961/11/08 DOA: 05/14/2024 DOS: the patient was seen and examined on 05/11/2024 PCP: Melvin Pao, NP  Patient coming from: Home - lives with roommate; NOK: Girlfriend, Russell Simpson, [phone]  Patient left AMA prior to admission    Chief Complaint: Tachycardia  HPI: Russell Simpson is a 63 y.o. male with medical history significant of chronic pain, COPD, follicular lymphoma (diagnosed in 2021, stopped treatment in 01/2024), HTN, and OSA not on CPAP who presented on 6/23 for abdominal tissue biopsy to evaluate for recurrent lymphoma.   He was found to have tachycardia while in specials and the procedure was canceled and he was transferred to the ER for evaluation.  He reports that he started getting herky-jerky feelings in my hands, has been starting to feel bad.  He has been seeing Dr. Trudy at [facility], holistic clinic, and he took him off medications in April.  His cancer, oh yeah, yeah, yeah, that... and me and her couldn't agree on certain things.  Dr. Melanee wants to do infusions but he got so sick the 2 times he tried this.  So he quit seeing her and went to the holistic doctor.  He drinks daily but his alcohol consumption has dropped way low since he quit smoking 7 months ago.  He drinks most days, last was 3 days ago because he was feeling bad.    ER Course:  Came in for a biopsy.  In Special Procedures, he was found to have tachycardia, mild hypoxia.  His complaint is insomnia.  Ca++ 13.4.  Also with holistic doctor treatment, off meds x 8 weeks.  Intermittent AMS x 1-2 days.  Also drinks ETOH.  H/o inappropriate tachycardia, has not been on metoprolol .  Dr. Melanee is his cancer doctor.     Review of Systems: As mentioned in the history of present illness. All other systems reviewed and are negative. 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HCC)    Arthritis     Asthma    Basal cell carcinoma 01/28/2021   R upper arm, EDC 03/04/2021   Basal cell carcinoma 12/31/2022   Right temporal hair line. Nodular. 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   right upper back ED&C done   Squamous cell carcinoma of skin 02/19/2021   L inferior mandible, treated with EDC   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/28/2021   R ant shoulder - ED&C   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/28/2021   L upper abdomen - ED&C   Past  Surgical History:  Procedure Laterality Date   ANTERIOR CERVICAL DECOMP/DISCECTOMY FUSION N/A 06/17/2016   Procedure: ANTERIOR CERVICAL DECOMPRESSION FUSION, CERVICAL 3-4, CERVICAL 4-5 WITH INSTRUMENTATION AND ALLOGRAFT;  Surgeon: Oneil Priestly, MD;  Location: [facility] OR;  Service: Orthopedics;  Laterality: N/A;  ANTERIOR CERVICAL DECOMPRESSION FUSION, CERVICAL 3-4, CERVICAL 4-5 WITH INSTRUMENTATION AND ALLOGRAFT   BACK SURGERY     x3   COLONOSCOPY WITH PROPOFOL  N/A 12/10/2023   Procedure: COLONOSCOPY WITH PROPOFOL ;  Surgeon: Unk Corinn Skiff, MD;  Location: [facility] ENDOSCOPY;  Service: Gastroenterology;  Laterality: N/A;   HARDWARE REMOVAL Right 05/04/2023   Procedure: HARDWARE REMOVAL;  Surgeon: Marchia Drivers, MD;  Location: [facility] ORS;  Service: Orthopedics;  Laterality: Right;   HEMOSTASIS CLIP PLACEMENT  12/10/2023   Procedure: HEMOSTASIS CLIP PLACEMENT;  Surgeon: Unk Corinn Skiff, MD;  Location: [facility] ENDOSCOPY;  Service: Gastroenterology;;   HEMOSTASIS CONTROL  12/10/2023   Procedure: HEMOSTASIS CONTROL;  Surgeon: Unk Corinn Skiff, MD;  Location: [facility] ENDOSCOPY;  Service: Gastroenterology;;   NECK SURGERY  12/01/2007   ORIF ANKLE FRACTURE Right 12/02/2022   Procedure: OPEN REDUCTION INTERNAL FIXATION (ORIF) ANKLE FRACTURE;  Surgeon: Marchia Drivers, MD;  Location: [facility] ORS;  Service: Orthopedics;  Laterality: Right;   ORIF ANKLE FRACTURE Right 12/01/2022   Procedure: OPEN REDUCTION INTERNAL FIXATION (ORIF) ANKLE FRACTURE;  Surgeon: Marchia Drivers, MD;  Location: [facility] ORS;  Service: Orthopedics;  Laterality: Right;   POLYPECTOMY  12/10/2023   Procedure: POLYPECTOMY;  Surgeon: Unk Corinn Skiff, MD;  Location: [facility] ENDOSCOPY;  Service: Gastroenterology;;   PORTA CATH INSERTION N/A 04/03/2021   Procedure: PORTA CATH INSERTION;  Surgeon: Marea Selinda RAMAN, MD;  Location: [facility] INVASIVE CV LAB;  Service: Cardiovascular;  Laterality: N/A;   SUBMUCOSAL LIFTING INJECTION  12/10/2023   Procedure:  SUBMUCOSAL LIFTING INJECTION;  Surgeon: Unk Corinn Skiff, MD;  Location: [facility] ENDOSCOPY;  Service: Gastroenterology;;   SUBMUCOSAL TATTOO INJECTION  12/10/2023   Procedure: SUBMUCOSAL TATTOO INJECTION;  Surgeon: Unk Corinn Skiff, MD;  Location: [facility] ENDOSCOPY;  Service: Gastroenterology;;   VIDEO BRONCHOSCOPY WITH ENDOBRONCHIAL ULTRASOUND Bilateral 10/14/2022   Procedure: VIDEO BRONCHOSCOPY WITH ENDOBRONCHIAL ULTRASOUND;  Surgeon: Tamea Dedra CROME, MD;  Location: [facility] ORS;  Service: Pulmonary;  Laterality: Bilateral;   Social History:  reports that he quit smoking about 17 months ago. His smoking use included cigars. He has been exposed to tobacco smoke. He has never used smokeless tobacco. He reports current alcohol use of about 42.0 standard drinks of alcohol per week. He reports that he does not use drugs.  Allergies  Allergen Reactions   Penicillins Anaphylaxis    Tolerates cefdinir , cephalexin and amoxicillin/clavulonate so true PCN allergy unlikely   Tetanus Toxoid Swelling   Tetanus Toxoid, Adsorbed Swelling  Tetanus Toxoids Swelling   Lisinopril Cough   Losartan      Other reaction(s): Muscle Pain     Codeine Nausea Only and Nausea And Vomiting   Doxycycline  Rash   Ruxience  [Rituximab -Pvvr] Nausea And Vomiting, Rash and Other (See Comments)    05/06/21 pt w/ worsening rash during Ruxience  infusion.  Face, neck and chest flushing redness  12/27/2023: flushing, diaphoresis, nausea and vomiting    Family History  Problem Relation Age of Onset   Diabetes Mother    Heart attack Father    Emphysema Father    Diabetes Other     Prior to Admission medications   Medication Sig Start Date End Date Taking? Authorizing Provider  albuterol  (VENTOLIN  HFA) 108 (90 Base) MCG/ACT inhaler Inhale 2 puffs into the lungs every 6 (six) hours as needed for wheezing or shortness of breath. 01/28/23   Tamea Dedra CROME, MD  allopurinol  (ZYLOPRIM ) 300 MG tablet Take 1 tablet (300 mg total)  by mouth daily. Patient not taking: Reported on 05/07/2024 05/16/24   Melanee Annah BROCKS, MD  atorvastatin  (LIPITOR ) 80 MG tablet Take 1 tablet (80 mg total) by mouth daily. Patient not taking: Reported on 05/12/2024 01/06/24   Darliss Rogue, MD  BLACK CURRANT SEED OIL PO Take by mouth.    [provider]  buPROPion  (WELLBUTRIN  XL) 300 MG 24 hr tablet Take 300 mg by mouth daily. 12/03/23   [provider]  cetirizine  (ZYRTEC ) 10 MG tablet Take 1 tablet (10 mg total) by mouth daily. 03/08/23   Melvin Pao, NP  clonazePAM  (KLONOPIN ) 0.5 MG tablet Take 0.5 mg by mouth every evening.    Severa Clarity, PA-C  cyclobenzaprine  (FLEXERIL ) 10 MG tablet Take 10 mg by mouth 3 (three) times daily. 03/30/24   [provider]  DULoxetine  (CYMBALTA ) 60 MG capsule Take 60 mg by mouth every evening. 07/01/22   [provider]  famotidine  (PEPCID ) 20 MG tablet TAKE 1 TABLET BY MOUTH DAILY. START TAKING 2 DAYS PRIOR TO CHEMO, DONT TAKE DAY OF CHEMO 05/04/24   Melanee Annah BROCKS, MD  finasteride  (PROSCAR ) 5 MG tablet Take 1 tablet (5 mg total) by mouth daily. 04/18/24   Helon Kirsch A, PA-C  fluorouracil  (EFUDEX ) 5 % cream Apply topically 2 (two) times daily. Apply to aa's R arm, R lower leg, and R neck BID until reaction occurs. 03/28/24   Claudene Lehmann, MD  fluticasone  (FLONASE ) 50 MCG/ACT nasal spray PLACE 2 SPRAYS INTO BOTH NOSTRIL ONCE DAILY 04/28/24   Melvin Pao, NP  gabapentin  (NEURONTIN ) 100 MG capsule Take 1 capsule (100 mg total) by mouth at bedtime. 05/17/24   Melvin Pao, NP  GARLIC PO Take by mouth.    [provider]  ibuprofen  (ADVIL ) 200 MG tablet Take 200 mg by mouth every 6 (six) hours as needed.    [provider]  Ivermectin 6 MG TABS Take 1 tablet by mouth daily.    [provider]  metoprolol  succinate (TOPROL -XL) 100 MG 24 hr tablet TAKE 1 TABLET BY MOUTH ONCE DAILY WITH OR IMMEDIATELY FOLLOWING A MEAL 02/03/24   Melvin Pao, NP  mirtazapine  (REMERON ) 15 MG tablet Take 15 mg by mouth at bedtime. 06/30/22   [provider]  montelukast  (SINGULAIR ) 10 MG tablet TAKE 1 TABLET EVERY DAY 11/19/23   Melvin Pao, NP  naltrexone  (DEPADE) 50 MG tablet Take 50 mg by mouth daily.    [provider]  ondansetron  (ZOFRAN ) 4 MG tablet Take 1 tablet (  4 mg total) by mouth every 8 (eight) hours as needed for nausea or vomiting. 12/17/23   Melanee Annah BROCKS, MD  sildenafil  (VIAGRA ) 100 MG tablet Take 1 tablet (100 mg total) by mouth daily as needed for erectile dysfunction. Take two hours prior to intercourse on an empty stomach 03/26/23   Helon Kirsch A, PA-C  tamsulosin  (FLOMAX ) 0.4 MG CAPS capsule Take 1 capsule (0.4 mg total) by mouth daily. 05/02/24   Helon Kirsch A, PA-C  tenofovir  alafenamide (VEMLIDY ) 25 MG tablet Take 1 tablet (25 mg total) by mouth daily. 04/18/24   Melanee Annah BROCKS, MD  testosterone  cypionate (DEPOTESTOSTERONE CYPIONATE) 200 MG/ML injection Inject 1 mL (200 mg total) into the muscle every 14 (fourteen) days. INJECT 1ML INTRAMUSCULARLY EVERY 14 DAYS 05/02/24   McGowan, Kirsch A, PA-C  zanubrutinib  (BRUKINSA ) 80 MG capsule Take 4 capsules (320 mg total) by mouth daily. 05/16/24   Melanee Annah BROCKS, MD    Physical Exam: Vitals:   05/01/2024 0859  BP: 109/76  Pulse: (!) 114  Resp: 18  Temp: 98.6 F (37 C)  TempSrc: Oral  SpO2: 94%  Weight: 108.9 kg  Height: 6' 2 (1.88 m)   General:  Appears calm and comfortable and is in NAD, chronically ill appearing Eyes:  EOMI, normal lids, iris ENT:  grossly normal hearing, lips & tongue, mmm Neck:  no LAD, masses or thyromegaly Cardiovascular:  RRR. No LE edema.  Respiratory:   CTA bilaterally with no wheezes/rales/rhonchi.  Normal respiratory effort. Abdomen:  soft, NT, ND Skin:  no rash or induration seen on limited exam Musculoskeletal:  grossly normal tone BUE/BLE, good ROM, no bony abnormality Psychiatric:  blunted mood and affect,  speech fluent and appropriate, AOx3, ?MCI Neurologic:  CN 2-12 grossly intact, moves all extremities in coordinated fashion   Radiological Exams on Admission: Independently reviewed - see discussion in A/P where applicable  No results found.  EKG: Independently reviewed.  Sinus tachycardia with rate 114; no evidence of acute ischemia   Labs on Admission: I have personally reviewed the available labs and imaging studies at the time of the admission.  Pertinent labs:    Na++ 131 BUN 15/Creatinine 1.5/GFR 53; 10/1.29/63 on 6/2 Calcium  13.4 Albumin 3.3 BNP 20.5 WBC 9.1 Hgb 12.3   Assessment and Plan: Active Problems:   * No active hospital problems. *    Hypercalcemia of malignancy Patient with known h/o lymphoma, not receiving active medical care for this currently Calcium  is significantly elevated Pamidronate ordered Additional oncologic planning underway but patient left AMA without further treatment (see below)  Follicular Lymphoma Reports that he received 2 rounds of infusion therapy for lymphoma and he refuses additional treatment It is not clear how well he understands his disease process - he thought he was coming in for tumor resection today rather than biopsy He has not been willing to undergo treatment and has been seeing a holistic doctor instead GOC discussed and he reported that he wants to be DNR He was offered treatment with infusions, f/u with Dr. Melanee to discuss alternative treatments, f/u in [facility] for a second opinion (requested by patient) or transition to comfort care if he is not going to proceed with treatment He ultimately requested hospice consultation and this was requested prior to him leaving AMA He will need to f/u with Dr. Melanee to discuss options prior to enrolling in home hospice He was briefly seen by Sidra Borders but the patient refused to engage with him As noted  above, cognitive impairment is a concern and may be interfering with his  ability to seek care appropriately  Aortic atherosclerosis Previously on ASA, statin It is unclear if he is currently taking these medications He reports that the holistic doctor took him off all medications  COPD Stopped smoking 7 months ago Continue albuterol  prn  ETOH dependence Reports decreased O2 use since he stopped smoking However, he continues to drink almost every day and some days >6/day Ongoing cessation encouraged  Bipolar d/o It is not clear if he is currently taking medications for this issue He does appear to still be taking clonazepam  according to PDMP, possibly also gabapentin  Cognitive impairment concern, as noted above  Marijuana abuse Cessation encouraged; this should be encouraged on an ongoing basis      The patient was planned for admission but became upset regarding GOC conversation and left AMA.  He will need ongoing discussions as an outpatient.  Author: Delon Herald, MD 04/30/2024 10:35 AM  For on call review [email or web address].

## 2024-05-30 NOTE — Progress Notes (Signed)
 Brief Interventional Radiology Note:  Patient presented to [facility] this morning for CT peri-aortic mass biopsy. Received notification that his HR was in the 140s and O2 sats were 88-92% on RA. Patient reports that he has not been taking any of his cardiac medications since seeing Dr. Trudy a few months ago; previously on 100mg  Metoprolol  daily, 2.5mg  Amlodipine  daily, and 80mg  Lipitor  daily. He also has a history of COPD, but not previously requiring oxygen. O2 sats from previous visits >93%.   Patient reports not sleeping well for the past several nights. Patient's visitor also reports concern regarding change in mentation since Friday evening when she saw him last and intermittent jerks of his left hand initially noted this morning.   Given 5mg  IV metoprolol  with improvement of his HR to 110s. Patient evaluated by Dr. Jenna at the bedside. After further discussion, it was recommended that the patient proceed to the ED for further workup and reschedule biopsy. Patient and visitor are agreeable to this plan and patient was transported to the ED with Joni, RN.   Please contact IR with any additional concerns.  Electronically Signed: Yailen Zemaitis M Kentley Blyden, PA-C 05/16/2024

## 2024-05-30 NOTE — ED Notes (Signed)
 Pt refusing care, states the next person he should see is hospice and then he should be going home. Hospitalist made aware.

## 2024-05-30 NOTE — Progress Notes (Signed)
 Patient for CT guided Abd Mass Biopsy on Monday 05/02/2024, I called and spoke with the patient on the phone and gave pre-procedure instructions. Pt was made aware to be here at 7:30a, NPO after MN prior to procedure as well as driver post procedure/recovery/discharge. Pt stated understanding.  Called 05/19/24

## 2024-05-30 NOTE — Progress Notes (Signed)
 PT to specials this morning for procedure. Upon getting pt on monitor, noted to have HR in 140's and o2 sats from 88-92. McKenzie PA and Dr. Jenna made aware. PT states no hx of oxygen use and has not been taking any prescription meds. PT's visitor also c/o pt having hand jerks last night on R side as well as decline in mental status since last seen Friday night around 9pm. Normal neuro assessment other than witnessed jerking on L arm. 5mg  IV metoprolol  ordered and given. Dr. Jenna and McKenzie to bedside to assess pt. PT transported to ER with Joni RN per Dr. Jenna.

## 2024-05-30 NOTE — ED Provider Notes (Signed)
 [facility] Provider Note   Event Date/Time   First MD Initiated Contact with Patient 05/15/2024 [id number]     (approximate) History  Tachycardia  HPI Russell Simpson is a 63 y.o. male with a past medical history of lymphoma, hyperlipidemia, and alcohol use disorder who presents from special procedures where he was supposed to have a biopsy done today due to tachycardia with heart rates in the 140s.  Patient states last alcohol use was the night previous.  Patient also states that he has been off of his regularly prescribed medications for the last 8 weeks due to the recommendations of a holistic doctor.  1 of those medications was metoprolol  for a history of tachycardia. ROS: Patient currently denies any vision changes, tinnitus, difficulty speaking, facial droop, sore throat, chest pain, shortness of breath, abdominal pain, nausea/vomiting/diarrhea, dysuria, or weakness/numbness/paresthesias in any extremity   Physical Exam  Triage Vital Signs: ED Triage Vitals  Encounter Vitals Group     BP 05/02/2024 0859 109/76     Girls Systolic BP Percentile --      Girls Diastolic BP Percentile --      Boys Systolic BP Percentile --      Boys Diastolic BP Percentile --      Pulse Rate 05/05/2024 0859 (!) 114     Resp 05/28/2024 0859 18     Temp 05/08/2024 0859 98.6 F (37 C)     Temp Source 05/29/2024 0859 Oral     SpO2 05/21/2024 0859 94 %     Weight 05/07/2024 0859 240 lb (108.9 kg)     Height 05/10/2024 0859 6' 2 (1.88 m)     Head Circumference --      Peak Flow --      Pain Score 05/19/2024 0856 0     Pain Loc --      Pain Education --      Exclude from Growth Chart --    Most recent vital signs: Vitals:   05/11/2024 0859  BP: 109/76  Pulse: (!) 114  Resp: 18  Temp: 98.6 F (37 C)  SpO2: 94%   General: Awake, oriented x4. CV:  Good peripheral perfusion. Resp:  Normal effort. Abd:  No distention. Other:  Middle-aged [place] obese Caucasian male resting comfortably in no  acute distress ED Results / Procedures / Treatments  Labs (all labs ordered are listed, but only abnormal results are displayed) Labs Reviewed  COMPREHENSIVE METABOLIC PANEL WITH GFR - Abnormal; Notable for the following components:      Result Value   Sodium 131 (*)    Creatinine, Ser 1.50 (*)    Calcium  13.4 (*)    Total Protein 5.3 (*)    Albumin 3.3 (*)    GFR, Estimated 53 (*)    All other components within normal limits  CBC WITH DIFFERENTIAL/PLATELET - Abnormal; Notable for the following components:   RBC 4.13 (*)    Hemoglobin 12.3 (*)    HCT 37.6 (*)    Platelets 112 (*)    Monocytes Absolute 1.4 (*)    Abs Immature Granulocytes 0.41 (*)    All other components within normal limits  BRAIN NATRIURETIC PEPTIDE  MAGNESIUM   PTH-RELATED PEPTIDE  TROPONIN I (HIGH SENSITIVITY)  TROPONIN I (HIGH SENSITIVITY)   EKG ED ECG REPORT I, Artist MARLA Kerns, the attending physician, personally viewed and interpreted this ECG. Date: 05/07/2024 EKG Time: 0909 Rate: 114 Rhythm: Tachycardic sinus rhythm QRS Axis: normal Intervals: normal ST/T Wave  abnormalities: normal Narrative Interpretation: Tachycardic sinus rhythm.  No evidence of acute ischemia RADIOLOGY ED MD interpretation: One-view portable chest x-ray interpreted by me shows no evidence of acute abnormalities including no pneumonia, pneumothorax, or widened mediastinum - All radiology independently interpreted and agree with radiology assessment Official radiology report(s): No results found. PROCEDURES: Critical Care performed: No Procedures MEDICATIONS ORDERED IN ED: Medications  sodium chloride  0.9 % bolus 1,000 mL (0 mLs Intravenous Stopped 05/01/2024 1024)  metoprolol  tartrate (LOPRESSOR ) tablet 50 mg (50 mg Oral Given 05/12/2024 1023)   IMPRESSION / MDM / ASSESSMENT AND PLAN / ED COURSE  I reviewed the triage vital signs and the nursing notes.                             The patient is on the cardiac monitor to  evaluate for evidence of arrhythmia and/or significant heart rate changes. Patient's presentation is most consistent with acute presentation with potential threat to life or bodily function.  This patient presents to the ED for concern of tachycardia, shortness of breath, and medication nonadherence, this involves an extensive number of treatment options, and is a complaint that carries with it a high risk of complications and morbidity.  The differential diagnosis includes PE, alcohol withdrawal, SVT, ACS, A-fib Co morbidities that complicate the patient evaluation  Lymphoma, medication nonadherence, alcohol abuse Additional history obtained:  Additional history obtained from patient's wife at bedside  External records from outside source obtained and reviewed including recent progress note from Dr. Liam oncology on 05/16/2024 Lab Tests:  I Ordered, and personally interpreted labs.  The pertinent results include: Calcium  13.4 Imaging Studies ordered:  I ordered imaging studies including chest x-ray  I independently visualized and interpreted imaging which showed no evidence of acute abnormalities  I agree with the radiologist interpretation Cardiac Monitoring: / EKG:  The patient was maintained on a cardiac monitor.  I personally viewed and interpreted the cardiac monitored which showed an underlying rhythm of: Sinus tachycardia Consultations Obtained:  I requested consultation with the Dr. Barbarann,  and discussed lab and imaging findings as well as pertinent plan - they recommend: Admission Problem List / ED Course / Critical interventions / Medication management  Hypercalcemia, tachycardia  I ordered medication including IV fluids, metoprolol  for tachycardia  Reevaluation of the patient after these medicines showed that the patient improved  I have reviewed the patients home medicines and have made adjustments as needed Dispo: Admit to medicine       FINAL CLINICAL  IMPRESSION(S) / ED DIAGNOSES   Final diagnoses:  Hypercalcemia  Sinus tachycardia   Rx / DC Orders   ED Discharge Orders     None      Note:  This document was prepared using Dragon voice recognition software and may include unintentional dictation errors.   Mars Scheaffer K, MD 05/26/2024 1050

## 2024-05-30 DEATH — deceased

## 2024-06-05 ENCOUNTER — Telehealth: Payer: Self-pay | Admitting: *Deleted

## 2024-06-05 NOTE — Telephone Encounter (Signed)
 Daughter called needing phone number to [facility] pharmacy to see how to return medication that was delivered to patient residence who is deceased. Phone number given.

## 2024-06-06 ENCOUNTER — Ambulatory Visit: Admitting: Oncology

## 2024-06-06 ENCOUNTER — Other Ambulatory Visit

## 2024-08-02 ENCOUNTER — Ambulatory Visit: Admitting: Urology

## 2024-08-29 ENCOUNTER — Ambulatory Visit: Admitting: Dermatology

## 2024-11-01 ENCOUNTER — Ambulatory Visit: Admitting: Nurse Practitioner

## 2025-04-24 ENCOUNTER — Ambulatory Visit
